# Patient Record
Sex: Female | Born: 1935 | Race: White | Hispanic: No | Smoking: Former smoker
Health system: Southern US, Community
[De-identification: ages and names within clinical notes are randomized; demographics above are authoritative.]

## PROBLEM LIST (undated history)

## (undated) DIAGNOSIS — S32509A Unspecified fracture of unspecified pubis, initial encounter for closed fracture: Secondary | ICD-10-CM

## (undated) DIAGNOSIS — R42 Dizziness and giddiness: Secondary | ICD-10-CM

## (undated) DIAGNOSIS — R0602 Shortness of breath: Secondary | ICD-10-CM

## (undated) DIAGNOSIS — R55 Syncope and collapse: Secondary | ICD-10-CM

## (undated) DIAGNOSIS — A0472 Enterocolitis due to Clostridium difficile, not specified as recurrent: Secondary | ICD-10-CM

## (undated) DIAGNOSIS — I2510 Atherosclerotic heart disease of native coronary artery without angina pectoris: Secondary | ICD-10-CM

## (undated) DIAGNOSIS — Z8669 Personal history of other diseases of the nervous system and sense organs: Secondary | ICD-10-CM

## (undated) DIAGNOSIS — I779 Disorder of arteries and arterioles, unspecified: Secondary | ICD-10-CM

## (undated) DIAGNOSIS — N200 Calculus of kidney: Secondary | ICD-10-CM

## (undated) DIAGNOSIS — I00 Rheumatic fever without heart involvement: Secondary | ICD-10-CM

## (undated) DIAGNOSIS — R001 Bradycardia, unspecified: Secondary | ICD-10-CM

## (undated) DIAGNOSIS — Z951 Presence of aortocoronary bypass graft: Secondary | ICD-10-CM

## (undated) DIAGNOSIS — I10 Essential (primary) hypertension: Secondary | ICD-10-CM

## (undated) DIAGNOSIS — I739 Peripheral vascular disease, unspecified: Secondary | ICD-10-CM

## (undated) DIAGNOSIS — K30 Functional dyspepsia: Secondary | ICD-10-CM

## (undated) DIAGNOSIS — K219 Gastro-esophageal reflux disease without esophagitis: Secondary | ICD-10-CM

## (undated) DIAGNOSIS — R269 Unspecified abnormalities of gait and mobility: Secondary | ICD-10-CM

## (undated) DIAGNOSIS — K5792 Diverticulitis of intestine, part unspecified, without perforation or abscess without bleeding: Secondary | ICD-10-CM

## (undated) DIAGNOSIS — J449 Chronic obstructive pulmonary disease, unspecified: Secondary | ICD-10-CM

## (undated) DIAGNOSIS — E785 Hyperlipidemia, unspecified: Secondary | ICD-10-CM

## (undated) DIAGNOSIS — N6019 Diffuse cystic mastopathy of unspecified breast: Secondary | ICD-10-CM

## (undated) HISTORY — DX: Disorder of arteries and arterioles, unspecified: I77.9

## (undated) HISTORY — PX: COLONOSCOPY: SHX174

## (undated) HISTORY — DX: Essential (primary) hypertension: I10

## (undated) HISTORY — DX: Atherosclerotic heart disease of native coronary artery without angina pectoris: I25.10

## (undated) HISTORY — DX: Diffuse cystic mastopathy of unspecified breast: N60.19

## (undated) HISTORY — DX: Dizziness and giddiness: R42

## (undated) HISTORY — PX: BREAST CYST ASPIRATION: SHX578

## (undated) HISTORY — DX: Calculus of kidney: N20.0

## (undated) HISTORY — DX: Bradycardia, unspecified: R00.1

## (undated) HISTORY — DX: Hyperlipidemia, unspecified: E78.5

## (undated) HISTORY — DX: Gastro-esophageal reflux disease without esophagitis: K21.9

## (undated) HISTORY — DX: Enterocolitis due to Clostridium difficile, not specified as recurrent: A04.72

## (undated) HISTORY — DX: Functional dyspepsia: K30

## (undated) HISTORY — DX: Rheumatic fever without heart involvement: I00

## (undated) HISTORY — DX: Personal history of other diseases of the nervous system and sense organs: Z86.69

## (undated) HISTORY — DX: Presence of aortocoronary bypass graft: Z95.1

## (undated) HISTORY — DX: Syncope and collapse: R55

## (undated) HISTORY — DX: Diverticulitis of intestine, part unspecified, without perforation or abscess without bleeding: K57.92

## (undated) HISTORY — DX: Unspecified fracture of unspecified pubis, initial encounter for closed fracture: S32.509A

## (undated) HISTORY — DX: Unspecified abnormalities of gait and mobility: R26.9

## (undated) HISTORY — DX: Peripheral vascular disease, unspecified: I73.9

## (undated) HISTORY — DX: Shortness of breath: R06.02

## (undated) HISTORY — PX: OTHER SURGICAL HISTORY: SHX169

---
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
## 1966-09-19 HISTORY — PX: TOTAL ABDOMINAL HYSTERECTOMY: SHX209

## 1992-09-19 HISTORY — PX: BREAST BIOPSY: SHX20

## 1997-09-19 HISTORY — PX: CORONARY ARTERY BYPASS GRAFT: SHX141

## 2005-05-30 ENCOUNTER — Ambulatory Visit: Payer: Self-pay | Admitting: Cardiology

## 2005-06-13 ENCOUNTER — Ambulatory Visit: Payer: Self-pay | Admitting: General Surgery

## 2005-07-15 ENCOUNTER — Ambulatory Visit: Payer: Self-pay | Admitting: Cardiology

## 2005-10-13 ENCOUNTER — Ambulatory Visit: Payer: Self-pay | Admitting: Cardiology

## 2006-04-12 ENCOUNTER — Ambulatory Visit: Payer: Self-pay | Admitting: Chiropractic Medicine

## 2006-07-07 ENCOUNTER — Ambulatory Visit: Payer: Self-pay | Admitting: General Surgery

## 2006-07-21 ENCOUNTER — Ambulatory Visit: Payer: Self-pay | Admitting: Cardiology

## 2006-07-26 ENCOUNTER — Ambulatory Visit: Payer: Self-pay

## 2007-05-30 ENCOUNTER — Ambulatory Visit: Payer: Self-pay | Admitting: Cardiology

## 2007-06-01 ENCOUNTER — Ambulatory Visit: Payer: Self-pay

## 2007-07-16 ENCOUNTER — Ambulatory Visit: Payer: Self-pay | Admitting: Cardiology

## 2007-08-01 ENCOUNTER — Ambulatory Visit: Payer: Self-pay | Admitting: General Surgery

## 2007-09-20 HISTORY — PX: CATARACT EXTRACTION: SUR2

## 2007-12-24 ENCOUNTER — Ambulatory Visit: Payer: Self-pay | Admitting: Cardiology

## 2008-01-08 ENCOUNTER — Ambulatory Visit: Payer: Self-pay

## 2008-06-30 ENCOUNTER — Ambulatory Visit: Payer: Self-pay | Admitting: Cardiology

## 2008-07-23 ENCOUNTER — Ambulatory Visit: Payer: Self-pay

## 2008-08-06 ENCOUNTER — Ambulatory Visit: Payer: Self-pay | Admitting: Ophthalmology

## 2008-08-19 ENCOUNTER — Ambulatory Visit: Payer: Self-pay | Admitting: Ophthalmology

## 2008-09-04 ENCOUNTER — Ambulatory Visit: Payer: Self-pay | Admitting: General Surgery

## 2008-10-01 ENCOUNTER — Ambulatory Visit: Payer: PRIVATE HEALTH INSURANCE | Admitting: Ophthalmology

## 2008-10-14 ENCOUNTER — Ambulatory Visit: Payer: PRIVATE HEALTH INSURANCE | Admitting: Ophthalmology

## 2008-12-11 ENCOUNTER — Encounter: Payer: Self-pay | Admitting: Cardiology

## 2008-12-11 ENCOUNTER — Telehealth (INDEPENDENT_AMBULATORY_CARE_PROVIDER_SITE_OTHER): Payer: Self-pay | Admitting: *Deleted

## 2008-12-11 ENCOUNTER — Ambulatory Visit: Payer: Self-pay | Admitting: Cardiology

## 2008-12-15 ENCOUNTER — Ambulatory Visit: Payer: Self-pay

## 2008-12-15 ENCOUNTER — Encounter: Payer: Self-pay | Admitting: Internal Medicine

## 2009-01-12 ENCOUNTER — Ambulatory Visit: Payer: Self-pay | Admitting: Cardiology

## 2009-07-27 ENCOUNTER — Encounter: Payer: Self-pay | Admitting: Cardiology

## 2009-07-28 ENCOUNTER — Ambulatory Visit: Payer: Self-pay

## 2009-08-03 ENCOUNTER — Encounter: Payer: Self-pay | Admitting: Cardiology

## 2009-09-14 ENCOUNTER — Ambulatory Visit: Payer: PRIVATE HEALTH INSURANCE | Admitting: General Surgery

## 2009-09-19 HISTORY — PX: CATARACT EXTRACTION: SUR2

## 2009-10-21 LAB — HM COLONOSCOPY

## 2009-11-11 ENCOUNTER — Encounter (INDEPENDENT_AMBULATORY_CARE_PROVIDER_SITE_OTHER): Payer: Self-pay | Admitting: *Deleted

## 2009-12-24 ENCOUNTER — Encounter: Payer: Self-pay | Admitting: Cardiology

## 2009-12-25 ENCOUNTER — Ambulatory Visit: Payer: Self-pay | Admitting: Cardiology

## 2010-01-15 ENCOUNTER — Ambulatory Visit: Payer: PRIVATE HEALTH INSURANCE | Admitting: General Surgery

## 2010-01-20 ENCOUNTER — Ambulatory Visit: Payer: PRIVATE HEALTH INSURANCE | Admitting: General Surgery

## 2010-01-27 ENCOUNTER — Encounter: Payer: Self-pay | Admitting: Cardiology

## 2010-01-28 ENCOUNTER — Ambulatory Visit: Payer: Self-pay

## 2010-01-28 ENCOUNTER — Encounter: Payer: Self-pay | Admitting: Cardiology

## 2010-03-18 ENCOUNTER — Ambulatory Visit: Payer: PRIVATE HEALTH INSURANCE | Admitting: Otolaryngology

## 2010-06-29 ENCOUNTER — Encounter: Payer: Self-pay | Admitting: Cardiology

## 2010-07-01 ENCOUNTER — Ambulatory Visit: Payer: Self-pay | Admitting: Cardiology

## 2010-07-01 ENCOUNTER — Telehealth (INDEPENDENT_AMBULATORY_CARE_PROVIDER_SITE_OTHER): Payer: Self-pay | Admitting: Radiology

## 2010-07-05 ENCOUNTER — Ambulatory Visit: Payer: Self-pay | Admitting: Cardiology

## 2010-07-05 ENCOUNTER — Encounter: Payer: Self-pay | Admitting: Cardiovascular Disease

## 2010-07-05 ENCOUNTER — Ambulatory Visit: Payer: Self-pay

## 2010-07-05 ENCOUNTER — Encounter (HOSPITAL_COMMUNITY)
Admission: RE | Admit: 2010-07-05 | Discharge: 2010-07-20 | Payer: Self-pay | Source: Home / Self Care | Attending: Cardiology | Admitting: Cardiology

## 2010-07-05 ENCOUNTER — Encounter: Payer: Self-pay | Admitting: *Deleted

## 2010-07-06 ENCOUNTER — Ambulatory Visit: Payer: Self-pay | Admitting: Cardiology

## 2010-07-13 ENCOUNTER — Telehealth: Payer: Self-pay | Admitting: Cardiology

## 2010-09-22 ENCOUNTER — Ambulatory Visit: Payer: PRIVATE HEALTH INSURANCE | Admitting: General Surgery

## 2010-10-06 ENCOUNTER — Ambulatory Visit: Admission: RE | Admit: 2010-10-06 | Discharge: 2010-10-06 | Payer: Self-pay | Source: Home / Self Care

## 2010-10-06 ENCOUNTER — Ambulatory Visit
Admission: RE | Admit: 2010-10-06 | Discharge: 2010-10-06 | Payer: Self-pay | Source: Home / Self Care | Attending: Cardiology | Admitting: Cardiology

## 2010-10-06 ENCOUNTER — Encounter: Payer: Self-pay | Admitting: Cardiology

## 2010-10-19 NOTE — Assessment & Plan Note (Signed)
Summary: dizziness,lightheaded,syncope/mt   Visit Type:  Follow-up Primary Glennon Kopko:  Jeffries-Lynch,cheryl  CC:   CAD.  History of Present Illness: The patient is seen for followup of coronary artery disease.  She has had some mild problems.  There is question of a TIA.  She has seen Dr.Love.  We will be obtaining his consult note today.  He did add Plavix to her aspirin.  She's had some indigestion symptoms.  Etiology is not yet clear.  She is on protonic.  She did not have chest pain with her coronary syndrome in the past.  Her husband died of coronary disease in his symptom was nausea and indigestion.  She is therefore concerned.  She also had some dizziness.  It is possible that this was part of the TIA symptoms that she had.  She also had an episode after playing 18 holes of golf of presyncope.  She also had an increase in her hydrochlorothiazide at that time.  Medicine was decreased and she has been stable.  Current Medications (verified): 1)  Diovan 320 Mg Tabs (Valsartan) .... Take One Tablet By Mouth Daily 2)  Aspirin Ec 325 Mg Tbec (Aspirin) .... Take One Tablet By Mouth Daily 3)  Simvastatin 40 Mg Tabs (Simvastatin) .... Take One Tablet By Mouth Daily At Bedtime 4)  Calcium 500+d 500-400 Mg-Unit Tabs (Calcium Carbonate-Vitamin D) .Marland Kitchen.. 1 Once Daily 5)  Protonix 40 Mg Tbec (Pantoprazole Sodium) .... Once Daily 6)  Plavix 75 Mg Tabs (Clopidogrel Bisulfate) .... Take One Tablet By Mouth Daily 7)  Hydrochlorothiazide 12.5 Mg Tabs (Hydrochlorothiazide) .... Take One Tablet By Mouth Daily. 8)  Multivitamins   Tabs (Multiple Vitamin) .... Once Daily 9)  Metamucil Plus Calcium  Caps (Psyllium-Calcium) .... 2 Once Daily  Allergies (verified): 1)  ! Codeine  Past History:  Past Medical History: Carotid artery disease   doppler..07/28/2009...[id number] LICA (slight progression) /   Jodelle Gross 01/28/2010..progression RICA [id number], LICA. no change...60-79%.. next 6  months HYPERLIPIDEMIA (ICD-272.4) HYPERTENSION (ICD-401.9) SHORTNESS OF BREATH (ICD-786.05) CAD....nuclear 05/2007...no scar or ischemia LV . normal by nuclear  2008  (no echo data as of 12/24/2009) Dizziness    stable now( October, 2011) patient tells me question of TIA... we will obtain records Syncopal episode     after 18 holes of golf and increase hydrochlorothiazide... resolved  Indigestion     3 weeks... October, 2011    Review of Systems       Patient denies fever, chills, headache, sweats, rash, change in vision, change in hearing, chest pain, cough, nausea vomiting, urinary symptoms.  All other systems are reviewed and are negative.  Vital Signs:  Patient profile:   77 year old female Height:      63 inches Weight:      128 pounds BMI:     22.76 Pulse rate:   70 / minute BP sitting:   108 / 60  (left arm) Cuff size:   regular  Vitals Entered By: Mignon Pine, RMA (July 01, 2010 8:15 AM)  Physical Exam  General:  patient is stable today. Head:  head is atraumatic. Eyes:  no xanthelasma. Neck:  no jugular venous distention. Chest Wall:  no chest wall tenderness. Lungs:  lungs are clear.  Respiratory effort is nonlabored. Heart:  cardiac exam reveals S1-S2.  No clicks or significant murmurs. Abdomen:  abdomen is soft. Msk:  no musculoskeletal deformities. Extremities:  no peripheral edema. Skin:  no skin rashes. Psych:  patient is oriented to person time and  place.  Affect is normal.   Impression & Recommendations:  Problem # 1:  * INDIGESTION The patient has had some indigestion in the past.  She is on Protonix.  It is possible that the addition of Plavix as cause increased symptoms.  I will put her Plavix on hold for a period of time and watch for symptoms.  We will also rule out cardiac ischemia.  Also we'll increase her PPI to b.i.d. for one week.  Problem # 2:  SYNCOPE (ICD-780.2)  Her updated medication list for this problem includes:    Aspirin Ec  325 Mg Tbec (Aspirin) .Marland Kitchen... Take one tablet by mouth daily    Plavix 75 Mg Tabs (Clopidogrel bisulfate) .Marland Kitchen... Take one tablet by mouth daily (hold) The patient had one isolated syncopal episode.  It sounds like dehydration having played 18 holes of golf and had increased hydrochlorothiazide.  No further workup.  Problem # 3:  * PLAVIX THERAPY For now we will put her Plavix on hold for one week.  Problem # 4:  * QUESTION TIA I will obtain the information from Dr. Erling Cruz about a possible TIA.  Problem # 5:  * DIZZINESS She is not having dizziness now.  It is my understanding this may be one of the symptoms related to possible TIA.  Problem # 6:  CAROTID ARTERY DISEASE (ICD-433.10)  Her updated medication list for this problem includes:    Aspirin Ec 325 Mg Tbec (Aspirin) .Marland Kitchen... Take one tablet by mouth daily    Plavix 75 Mg Tabs (Clopidogrel bisulfate) .Marland Kitchen... Take one tablet by mouth daily (hold) The patient had followup Doppler in May, 2011.  There was some progression.  She is to have a follow up in 6 months.  Problem # 7:  CAD (ICD-414.00)  Her updated medication list for this problem includes:    Aspirin Ec 325 Mg Tbec (Aspirin) .Marland Kitchen... Take one tablet by mouth daily    Plavix 75 Mg Tabs (Clopidogrel bisulfate) .Marland Kitchen... Take one tablet by mouth daily (hold) We need to be sure that the patient's indigestion is not ischemia.  She has coronary disease.  Her last nuclear scan was 2008.  I do not have an echo done recently.  We will proceed with a [place] scan Myoview.  This will be done very soon so that I can see her back next week.  She hopes to be able to travel out of town week from today.  Orders: Nuclear Stress Test (Nuc Stress Test)  Patient Instructions: 1)  Hold Plavix 2)  Increase Protonix to two times a day  3)  Your physician has requested that you have a Lexiscan myoview.  For further information please visit [email or web address].  Please follow instruction sheet, as given. 4)   Follow up in 1 week.

## 2010-10-19 NOTE — Progress Notes (Signed)
Summary: pharmacy calling/ rx refill  Phone Note Refill Request Call back at [phone]   Refills Requested: Medication #1:  PROTONIX 40 MG TBEC Take 1 tablet by mouth two times a day  Medication #2:  PLAVIX 75 MG TABS Take one tablet by mouth daily Caller: [place]* Summary of Call: calling regarding medication Ref# [id number] Initial call taken by: Delsa Sale,  July 13, 2010 2:10 PM  Follow-up for Phone Call        per pt calling back in regards to medication plavix.   per pt calling back regarding her rx. ref # [id number]. [phone] .ask for natasha.  phone  # [phone] Neil Crouch  July 13, 2010 2:59 PM  Spoke with [facility] they were questioning if Pt was to be on both meds because of an possible interaction with the two Follow-up by: Mignon Pine, RMA,  July 14, 2010 4:29 PM

## 2010-10-19 NOTE — Assessment & Plan Note (Signed)
Summary: Cardiology Nuclear Testing  Nuclear Med Background Indications for Stress Test: Evaluation for Ischemia   History: CABG, COPD, Myocardial Perfusion Study  History Comments: '99 CABG x 3 3/10- Nl. MPS. EF= 68%  Symptoms: Dizziness, Palpitations  Symptoms Comments: CP>back   Nuclear Pre-Procedure Cardiac Risk Factors: Carotid Disease, Family History - CAD, History of Smoking, Hypertension, Lipids Caffeine/Decaff Intake: none NPO After: 7:30 AM Lungs: clea IV 0.9% NS with Angio Cath: 22g     IV Site: R Hand IV Started by: Matilde Haymaker, RN Chest Size (in) 34     Cup Size B     Height (in): 63 Weight (lb): 129 BMI: 22.93  Nuclear Med Study 1 or 2 day study:  1 day     Stress Test Type:  Carlton Adam Reading MD:  Darlin Coco, MD     Referring MD:  Dola Argyle, MD Resting Radionuclide:  Technetium 50m Tetrofosmin     Resting Radionuclide Dose:  11.0 mCi  Stress Radionuclide:  Technetium 63m Tetrofosmin     Stress Radionuclide Dose:  33.0 mCi   Stress Protocol   Lexiscan: 0.4 mg   Stress Test Technologist:  Perrin Maltese, EMT-P     Nuclear Technologist:  Annye Rusk, CNMT  Rest Procedure  Myocardial perfusion imaging was performed at rest 45 minutes following the intravenous administration of Technetium 16m Tetrofosmin.  Stress Procedure  The patient received IV Lexiscan 0.4 mg over 15-seconds.  Technetium 90m Tetrofosmin injected at 30-seconds.  There were no significant changes and occ pvcs/pacs with infusion.  Quantitative spect images were obtained after a 45 minute delay.  QPS Raw Data Images:  Acquisition technically good; normal left ventricular size. Stress Images:  Normal homogeneous uptake in all areas of the myocardium. Rest Images:  Normal homogeneous uptake in all areas of the myocardium. Subtraction (SDS):  Normal Transient Ischemic Dilatation:  0.97  (Normal <1.22)  Lung/Heart Ratio:  0.30  (Normal <0.45)  Quantitative Gated Spect  Images QGS cine images:  Not gated  Findings Low risk nuclear study      Overall Impression  Exercise Capacity: Lexiscan with no exercise. BP Response: Normal blood pressure response. Clinical Symptoms: No chest pain ECG Impression: There are scattered PVCs. Overall Impression: Low risk stress nuclear study. Overall Impression Comments: No evidence of ischemia by perfusion.  No EKG changes of ischemia.  Occasional PVCs. Study was not gated.  Appended Document: Cardiology Nuclear Testing pt aware at appt 10/18

## 2010-10-19 NOTE — Assessment & Plan Note (Signed)
Summary: f1y   Visit Type:  Follow-up Primary Provider:  Jeffries-Lynch,cheryl  CC:  CAD.  History of Present Illness: The patient is seen for follow up of coronary disease.  Historically her symptom of ischemia was significant shortness of breath.  She is not having this.  She does not have chest pain.  She has some mild shortness of breath with excessive exertion.  Patient does have carotid disease that is being followed carefully.  Current Medications (verified): 1)  Diovan 160 Mg Tabs (Valsartan) .... Take One Tablet By Mouth Daily 2)  Aspirin Ec 325 Mg Tbec (Aspirin) .... Take One Tablet By Mouth Daily 3)  Crestor 40 Mg Tabs (Rosuvastatin Calcium) .... Take One Tablet By Mouth Daily. 4)  Calcium 500+d 500-400 Mg-Unit Tabs (Calcium Carbonate-Vitamin D) .Marland Kitchen.. 1 Once Daily 5)  Protonix 40 Mg Tbec (Pantoprazole Sodium) .... Once Daily 6)  Triamterene-Hctz 37.5-25 Mg Tabs (Triamterene-Hctz) .... 1/2 Daily 7)  Ciprofloxacin Hcl 500 Mg Tabs (Ciprofloxacin Hcl) .... Two Times A Day 8)  Metronidazole 500 Mg Tabs (Metronidazole) .... Two Times A Day  Allergies (verified): 1)  ! Codeine  Past History:  Past Medical History: Last updated: 12/24/2009 Carotid artery disease   doppler..07/28/2009...[id number] LICA (slight progression) f/u 6 mo. [place] ([place].4) HYPERTENSION (ICD-401.9) SHORTNESS OF BREATH (ICD-786.05) CAD....nuclear 05/2007...no scar or ischemia LV . normal by nuclear  2008  (no echo data as of 12/24/2009)    Review of Systems       Patient denies fever, chills, headache, sweats, rash, change in vision, change in hearing, chest pain, cough, nausea vomiting, urinary symptoms.  All other systems are reviewed and are negative.  Vital Signs:  Patient profile:   77 year old female Height:      63 inches Weight:      128 pounds BMI:     22.76 Pulse rate:   63 / minute BP sitting:   106 / 60  (left arm) Cuff size:   regular  Vitals Entered By:  Mignon Pine, RMA (December 25, 2009 2:44 PM)  Physical Exam  General:   patient is quite stable. Eyes:    No xanthelasma. Neck:  no jugular venous distention. Lungs:  lungs are clear respiratory effort is nonlabored. Heart:  cardiac exam reveals S1 and S2.  No clicks or significant murmurs. Abdomen:  abdomen is soft. Extremities:  no peripheral edema. Psych:  patient is oriented to person time and place affect is normal.   Impression & Recommendations:  Problem # 1:  CAROTID ARTERY DISEASE (ICD-433.10)  Her updated medication list for this problem includes:    Aspirin Ec 325 Mg Tbec (Aspirin) .Marland Kitchen... Take one tablet by mouth daily The patient's carotids are being followed carefully.  Problem # 2:  HYPERTENSION (ICD-401.9)  Her updated medication list for this problem includes:    Diovan 160 Mg Tabs (Valsartan) .Marland Kitchen... Take one tablet by mouth daily    Aspirin Ec 325 Mg Tbec (Aspirin) .Marland Kitchen... Take one tablet by mouth daily    Triamterene-hctz 37.5-25 Mg Tabs (Triamterene-hctz) .Marland Kitchen... 1/2 daily blood pressure is well controlled.  Problem # 3:  SHORTNESS OF BREATH (ICD-786.05)  Her updated medication list for this problem includes:    Diovan 160 Mg Tabs (Valsartan) .Marland Kitchen... Take one tablet by mouth daily    Aspirin Ec 325 Mg Tbec (Aspirin) .Marland Kitchen... Take one tablet by mouth daily    Triamterene-hctz 37.5-25 Mg Tabs (Triamterene-hctz) .Marland Kitchen... 1/2 daily Patient has some mild shortness of breath  but I'm not convinced that represents ischemia.  She will be in touch with me if he changes.  Problem # 4:  CAD (ICD-414.00)  Her updated medication list for this problem includes:    Aspirin Ec 325 Mg Tbec (Aspirin) .Marland Kitchen... Take one tablet by mouth daily  Orders: EKG w/ Interpretation (93000) Coronary disease is stable.  EKG is done today and reviewed by me.  There is sinus rhythm.  There are nonspecific ST-T wave changes.  Patient is stable.  No change in medication.  Followup one year.  Patient  Instructions: 1)  Your physician recommends that you schedule a follow-up appointment in: 1 year

## 2010-10-19 NOTE — Miscellaneous (Signed)
  Clinical Lists Changes  Problems: Removed problem of CAROTID BRUIT (ICD-785.9) Added new problem of CAROTID ARTERY DISEASE (ICD-433.10) Observations: Added new observation of PAST MED HX: Carotid artery disease   doppler..07/28/2009...[id number] LICA (slight progression) f/u 6 mo. [place] ([place].4) HYPERTENSION (ICD-401.9) SHORTNESS OF BREATH (ICD-786.05) CAD....nuclear 05/2007...no scar or ischemia LV . normal by nuclear  2008  (no echo data as of 12/24/2009)   (12/24/2009 8:15) Added new observation of PRIMARY MD: Jeffries-Lynch,cheryl (12/24/2009 8:15)       Past History:  Past Medical History: Carotid artery disease   doppler..07/28/2009...[id number] LICA (slight progression) f/u 6 mo. [place] ([place].4) HYPERTENSION (ICD-401.9) SHORTNESS OF BREATH (ICD-786.05) CAD....nuclear 05/2007...no scar or ischemia LV . normal by nuclear  2008  (no echo data as of 12/24/2009)

## 2010-10-19 NOTE — Progress Notes (Signed)
Summary: Nuc Pre-Procedure  Phone Note Outgoing Call Call back at [facility] Phone [phone]   Call placed by: Evalina Field, RT-N,  July 01, 2010 3:27 PM Call placed to: Patient Reason for Call: Confirm/change Appt Summary of Call: Left message with information on Myoview Information Sheet (see scanned document for details).      Nuclear Med Background Indications for Stress Test: Evaluation for Ischemia   History: CABG, COPD, Myocardial Perfusion Study  History Comments: '99 CABG x 3 3/10- Nl. MPS. EF= 68%  Symptoms: Dizziness  Symptoms Comments: CP>back   Nuclear Pre-Procedure Cardiac Risk Factors: Carotid Disease, Family History - CAD, History of Smoking, Hypertension, Lipids Height (in): 97  Nuclear Med Study Referring MD:  Dola Argyle, MD

## 2010-10-19 NOTE — Assessment & Plan Note (Signed)
Summary: f/u myoview 07-05-10/sl   Visit Type:  Follow-up Primary Provider:  Jeffries-Lynch,cheryl  CC:  CAD .  History of Present Illness: The patient is seen for follow up of coronary disease.  I saw her last on July 01, 2010.  She has known coronary disease.  She had never actually had significant symptoms.  Recently she had indigestion.  Her husband is symptom of coronary disease was indigestion and she was very worried about.  She was getting ready to travel out of town.  Also she had been on Plavix recently that was added for a TIA.  I decided to increase her PPI and to hold her Plavix for a brief period of time.  We also decided to proceed with a stress Myoview scan.  The scan was done and there is no sign of ischemia.  Ejection fraction is not available as the study could not be gated.  There was no definite ischemia.  She returns today feeling relatively well.  She is some mild intermittent indigestion.  It is definitely not exertional  Current Medications (verified): 1)  Diovan 320 Mg Tabs (Valsartan) .... Take One Tablet By Mouth Daily 2)  Aspirin Ec 325 Mg Tbec (Aspirin) .... Take One Tablet By Mouth Daily 3)  Simvastatin 40 Mg Tabs (Simvastatin) .... Take One Tablet By Mouth Daily At Bedtime 4)  Calcium 500+d 500-400 Mg-Unit Tabs (Calcium Carbonate-Vitamin D) .Marland Kitchen.. 1 Once Daily 5)  Protonix 40 Mg Tbec (Pantoprazole Sodium) .... Take 1 Tablet By Mouth Two Times A Day 6)  Plavix 75 Mg Tabs (Clopidogrel Bisulfate) .... Take One Tablet By Mouth Daily (Hold) 7)  Hydrochlorothiazide 12.5 Mg Tabs (Hydrochlorothiazide) .... Take One Tablet By Mouth Daily. 8)  Multivitamins   Tabs (Multiple Vitamin) .... Once Daily 9)  Metamucil Plus Calcium  Caps (Psyllium-Calcium) .... 2 Once Daily  Allergies (verified): 1)  ! Codeine  Past History:  Past Medical History: Carotid artery disease   doppler..07/28/2009...[id number] LICA (slight progression) /   Jodelle Gross 01/28/2010..progression  RICA 123456, LICA. no change...60-79%.. next 6 months HYPERLIPIDEMIA (ICD-272.4) HYPERTENSION (ICD-401.9) SHORTNESS OF BREATH (ICD-786.05) CAD....nuclear 05/2007...no scar or ischemia  /  nuclear.... July 05, 2010.... not gated.... no scar or ischemia LV . normal by nuclear  2008  (no echo data as of 12/24/2009) Dizziness    stable now( October, 2011) patient tells me question of TIA... we will obtain records Syncopal episode     after 18 holes of golf and increase hydrochlorothiazide... resolved  Indigestion     3 weeks... October, 2011    Review of Systems       The patient denies fever, chills, headache, sweats, rash, change in vision, change in hearing, chest pain, cough, nausea vomiting, urinary symptoms.  All other systems are reviewed and are negative.  Vital Signs:  Patient profile:   77 year old female Height:      63 inches Weight:      130 pounds BMI:     23.11 Pulse rate:   70 / minute BP sitting:   102 / 54  (left arm) Cuff size:   regular  Vitals Entered By: Mignon Pine, RMA (July 06, 2010 3:02 PM)  Physical Exam  General:  patient is stable. Eyes:  no xanthelasma. Neck:  no jugular venous attention. Lungs:  lungs are clear.  Respiratory effort is nonlabored. Heart:  cardiac exam reveals S1-S2.  No clicks or significant murmurs. Abdomen:  abdomen is soft. Extremities:  no peripheral edema. Psych:  patient is oriented to person time and place.  Affect is normal.   Impression & Recommendations:  Problem # 1:  * INDIGESTION Indigestion is mild.  She has been off Plavix for 5 days with no significant change.  I feel that the Plavix is not playing a role.  She will remain on her PPI.  Problem # 2:  * PLAVIX THERAPY I decided to restart her Plavix in his 81 mg of aspirin.   Problem # 3:  * QUESTION TIA Is because of this issue and I put her back on the Plavix with her aspirin.  Problem # 4:  CAROTID ARTERY DISEASE (ICD-433.10)  Her updated medication  list for this problem includes:    Aspirin 81 Mg Tbec (Aspirin) .Marland Kitchen... Take one tablet by mouth daily    Plavix 75 Mg Tabs (Clopidogrel bisulfate) .Marland Kitchen... Take one tablet by mouth daily We're following her carotids carefully.  Problem # 5:  CAD (ICD-414.00)  Her updated medication list for this problem includes:    Aspirin 81 Mg Tbec (Aspirin) .Marland Kitchen... Take one tablet by mouth daily    Plavix 75 Mg Tabs (Clopidogrel bisulfate) .Marland Kitchen... Take one tablet by mouth daily Coronary disease is stable.  Her nuclear scan reveals no scar or ischemia.  No further workup at this time.  Patient Instructions: 1)  Restart Plavix 75mg  daily 2)  Decrease ASA to 81mg  daily 3)  Follow up in 3 months Prescriptions: PLAVIX 75 MG TABS (CLOPIDOGREL BISULFATE) Take one tablet by mouth daily  #90 x 3   Entered by:   Kevan Rosebush, RN   Authorized by:   Lorenza Evangelist, MD, [facility]   Signed by:   Kevan Rosebush, RN on 07/06/2010   Method used:   Electronically to        [facility]* ([place]  [address]       Ph: [phone]       Fax: [phone]   RxID:   [id number] PLAVIX 75 MG TABS (CLOPIDOGREL BISULFATE) Take one tablet by mouth daily  #90 x 3   Entered by:   Kevan Rosebush, RN   Authorized by:   Lorenza Evangelist, MD, [facility]   Signed by:   Kevan Rosebush, RN on 07/06/2010   Method used:   Print then Give to Patient   RxID:   [id number]

## 2010-10-19 NOTE — Letter (Signed)
Summary: Appointment - [facility] [place], [place]  [address] [place]   [place], [place] [address]   Phone: [phone]  Fax: [phone]     November 11, 2009 MRN: [id number]   [facility] [address] [place], [place]  [address]   Dear Ms. Fitchett,  Our records indicate that it is time to schedule a follow-up appointment with Dr. Ron Parker. It is very important that we reach you to schedule this appointment. We look forward to participating in your health care needs. Please contact us at the number listed above at your earliest convenience to schedule your appointment.  If you are unable to make an appointment at this time, give [place] a call so we can update our records.     Sincerely,   Darnell Level [facility] Scheduling Team

## 2010-10-19 NOTE — Miscellaneous (Signed)
  Clinical Lists Changes  Observations: Added new observation of PAST MED HX: Carotid artery disease   doppler..07/28/2009...[id number] LICA (slight progression) /   Jodelle Gross 01/28/2010..progression RICA 123456, LICA. no change...60-79%.. next 6 months HYPERLIPIDEMIA (ICD-272.4) HYPERTENSION (ICD-401.9) SHORTNESS OF BREATH (ICD-786.05) CAD....nuclear 05/2007...no scar or ischemia LV . normal by nuclear  2008  (no echo data as of 12/24/2009)   (06/29/2010 17:49) Added new observation of PRIMARY MD: Jeffries-Lynch,cheryl (06/29/2010 17:49)       Past History:  Past Medical History: Carotid artery disease   doppler..07/28/2009...[id number] LICA (slight progression) /   Jodelle Gross 01/28/2010..progression RICA 123456, LICA. no change...60-79%.. next 6 months HYPERLIPIDEMIA (ICD-272.4) HYPERTENSION (ICD-401.9) SHORTNESS OF BREATH (ICD-786.05) CAD....nuclear 05/2007...no scar or ischemia LV . normal by nuclear  2008  (no echo data as of 12/24/2009)

## 2010-10-19 NOTE — Miscellaneous (Signed)
Summary: Orders Update  Clinical Lists Changes  Orders: Added new Test order of Carotid Duplex (Carotid Duplex) - Signed 

## 2010-10-21 NOTE — Assessment & Plan Note (Signed)
Summary: 3 MONTH ROV/SL   Visit Type:  Follow-up Primary Provider:  Jeffries-Lynch,cheryl  CC:  CAD.  History of Present Illness: The patient is seen for followup of coronary artery disease.  She is doing well.  I saw her last October, 2011.  She's had some indigestion in the past.  We were concerned about this symptom and a followup stress study revealed no significant ischemia.  When I saw her last we arrange for a three-month followup.  She has been active and she is not having any significant symptoms.  Preventive Screening-Counseling & Management  Caffeine-Diet-Exercise     Does Patient Exercise: yes      Drug Use:  no.    Current Medications (verified): 1)  Diovan 320 Mg Tabs (Valsartan) .... Take One Tablet By Mouth Daily 2)  Aspirin 81 Mg Tbec (Aspirin) .... Take One Tablet By Mouth Daily 3)  Simvastatin 40 Mg Tabs (Simvastatin) .... Take One Tablet By Mouth Daily At Bedtime 4)  Calcium 500+d 500-400 Mg-Unit Tabs (Calcium Carbonate-Vitamin D) .Marland Kitchen.. 1 Once Daily 5)  Protonix 40 Mg Tbec (Pantoprazole Sodium) .... Take 1 Tablet By Mouth Two Times A Day 6)  Plavix 75 Mg Tabs (Clopidogrel Bisulfate) .... Take One Tablet By Mouth Daily 7)  Hydrochlorothiazide 12.5 Mg Tabs (Hydrochlorothiazide) .... Take One Tablet By Mouth Daily. 8)  Multivitamins   Tabs (Multiple Vitamin) .... Once Daily 9)  Metamucil Plus Calcium  Caps (Psyllium-Calcium) .... 2 Once Daily  Allergies (verified): 1)  ! Codeine  Past History:  Past Medical History: Carotid artery disease   doppler..07/28/2009...[id number] LICA (slight progression) /   Jodelle Gross 01/28/2010..progression RICA [id number], LICA. no change...60-79%.. next 6 months HYPERLIPIDEMIA (ICD-272.4) HYPERTENSION (ICD-401.9) SHORTNESS OF BREATH (ICD-786.05) CAD....nuclear 05/2007...no scar or ischemia  /  nuclear.... July 05, 2010.... not gated.... no scar or ischemia LV . normal by nuclear  2008  (no echo data as of 12/24/2009) Dizziness     stable now( October, 2011) patient tells me question of TIA... we will obtain records Syncopal episode     after 18 holes of golf and increase hydrochlorothiazide... resolved  Indigestion     3 weeks... October, 2011  .Marland Kitchen  Past Surgical History: Breast Biopsy Esophagus stretched Abdominal Hysterectomy-Total Appendectomy CABG Tonsillectomy  Family History: Family History of Cancer:  Family History of Coronary Artery Disease:  Family History of Sudden Cardiac Death:   Social History: Tobacco Use - No.  Retired  Widowed  Alcohol Use - yes -- social, wine Regular Exercise - yes Drug Use - no Does Patient Exercise:  yes Drug Use:  no  Review of Systems       Patient denies fever, chills, headache, sweats, rash,, change in vision, change in hearing, chest pain, cough, nausea vomiting, urinary symptoms.  All other systems are reviewed and are negative.  Vital Signs:  Patient profile:   77 year old female Height:      63 inches Weight:      130 pounds BMI:     23.11 Pulse rate:   70 / minute BP sitting:   136 / 62  (left arm) Cuff size:   regular  Vitals Entered By: Mignon Pine, RMA (October 06, 2010 9:57 AM)  Physical Exam  General:  patient looks good. Eyes:  no xanthelasma. Neck:  no jugular venous distention. Lungs:  lungs are clear respiratory effort is nonlabored. Heart:  cardiac exam reveals an S1-S2.  No clicks or significant murmurs. Abdomen:  abdomen is soft. Extremities:  no peripheral edema. Psych:  patient is oriented to person time and place.  Affect is normal.   Impression & Recommendations:  Problem # 1:  * INDIGESTION Patient is on Protonix and she is not having much indigestion  Problem # 2:  SYNCOPE (ICD-780.2)  Her updated medication list for this problem includes:    Aspirin 81 Mg Tbec (Aspirin) .Marland Kitchen... Take one tablet by mouth daily    Plavix 75 Mg Tabs (Clopidogrel bisulfate) .Marland Kitchen... Take one tablet by mouth daily There's been no  syncope.  Problem # 3:  CAROTID ARTERY DISEASE (ICD-433.10)  Her updated medication list for this problem includes:    Aspirin 81 Mg Tbec (Aspirin) .Marland Kitchen... Take one tablet by mouth daily    Plavix 75 Mg Tabs (Clopidogrel bisulfate) .Marland Kitchen... Take one tablet by mouth daily Her carotids are being followed.  I will check on the timing of her next Doppler.  Orders: Carotid Duplex (Carotid Duplex)  Problem # 4:  CAD (ICD-414.00)  Her updated medication list for this problem includes:    Aspirin 81 Mg Tbec (Aspirin) .Marland Kitchen... Take one tablet by mouth daily    Plavix 75 Mg Tabs (Clopidogrel bisulfate) .Marland Kitchen... Take one tablet by mouth daily Coronary disease is stable.  No further workup.  Osteophyte in 6 months.  Patient Instructions: 1)  Your physician has requested that you have a carotid duplex. This test is an ultrasound of the carotid arteries in your neck. It looks at blood flow through these arteries that supply the brain with blood. Allow one hour for this exam. There are no restrictions or special instructions. 2)  Your physician wants you to follow-up in:  6 months.  You will receive a reminder letter in the mail two months in advance. If you don't receive a letter, please call our office to schedule the follow-up appointment.

## 2011-02-01 NOTE — Assessment & Plan Note (Signed)
[place] OFFICE NOTE   Katrina, Allen                    MRN:          [id number]  DATE:12/11/2008                            DOB:          Nov 12, 1925    HISTORY OF PRESENT ILLNESS:  [name] is doing well.  She has known  coronary disease.  She is complaining today of some shortness of breath  with exertion.  She does not have PND or orthopnea.  She has some mild  indigestion at night time, but not with exercise.  Otherwise, she is  doing well.   PAST MEDICAL HISTORY:   ALLERGIES:  Swelling from [place].  She does not tolerate CODEINE.   MEDICATIONS:  See the EMR.   OTHER MEDICAL PROBLEMS:  See the list on the note of July 16, 2007.   REVIEW OF SYSTEMS:  Today she has no fevers or chills.  She has no  headaches.  She does have some mild indigestion at night.  She has no  skin rashes.  There are no GU problems.  Review of systems otherwise is  negative.   PHYSICAL EXAMINATION:  VITAL SIGNS:  Blood pressure is 140/68 with a  pulse of 60.  GENERAL:  The patient is oriented to person, time and place.  Affect is  normal.  HEENT:  Reveals no xanthelasma.  She has normal extraocular motion.  NECK:  There is a soft carotid bruit.  There is no jugular venous  distention.  LUNGS:  Clear.  Respiratory effort is not labored.  CARDIAC:  Reveals a S1 with an S2.  There are no clicks or significant  murmurs.   EKG shows no significant change.  Carotid Dopplers done on July 23, 2008 revealed, that her disease was stable, 0 to 39% right internal  carotid artery stenosis and 40-59% left internal carotid artery  stenosis.   PROBLEMS:  Include  1. Coronary artery disease.  2. Carotid disease, stable.  3. Shortness of breath.  This is with exertion.  It may be an anginal      equivalent.  We will proceed with an adenosine Myoview scan and      then I will see her back for followup.     Carlena Bjornstad, MD, [facility]  Electronically Signed    JDK/MedQ  DD: 12/11/2008  DT: 12/12/2008  Job #: [id number]

## 2011-02-01 NOTE — Assessment & Plan Note (Signed)
[place] OFFICE NOTE   VEEHA, BACKES                    MRN:          [id number]  DATE:12/24/2007                            DOB:          November 03, 1925    [name] is actually doing well.  She has rare indigestion.  It is  at night and I do not believe that it is ischemic.  She is stable.  She  is going about full activities.   ALLERGIES:  Swelling from [place].  She does not tolerate CODEINE.   MEDICATIONS:  Protonix, hydrochlorothiazide, Diovan, aspirin, calcium  and Crestor.   PAST MEDICAL HISTORY:  See the list in the note of July 16, 2007.   REVIEW OF SYSTEMS:  She is feeling well and not having any significant  problems, other than the HPI.  Review of systems otherwise is negative.   PHYSICAL EXAMINATION:  VITAL SIGNS:  Blood pressure is 137/60 with a  pulse of 60.  GENERAL:  The patient is oriented to person, time and place.  Affect is  normal.  HEENT:  No xanthelasma.  She has normal extraocular motion.  NECK:  The patient has a right carotid bruit.  LUNGS:  Clear.  Respiratory effort is not labored.  ABDOMEN:  Soft.  She has no peripheral edema.   IMPRESSION:  [name] is stable.  Her last Doppler was 2 years ago.  Her bruit in her right neck is prominent and she needs a followup  carotid Doppler.   Problems are listed on the note of July 16, 2007.   #1.  Coronary disease, stable.  #6.  Carotid bruits.  Repeat Doppler to be done.   PLAN:  I will see her back in 6 months.     Carlena Bjornstad, MD, [facility]  Electronically Signed    JDK/MedQ  DD: 12/24/2007  DT: 12/24/2007  Job #: [id number]   cc:   Sherald Barge, M.D.

## 2011-02-01 NOTE — Assessment & Plan Note (Signed)
[place] OFFICE NOTE   ANERES, MUHL                    MRN:          [id number]  DATE:07/16/2007                            DOB:          [birth year]    [name] is here for cardiology followup. I had seen her on  May 30, 2007. We decided to proceed with chest x-ray, which showed  some COPD and a Myoview scan which fortunately showed good ejection  fraction and no sign of scar or ischemia. After that time, she traveled  with her husband to [facility] and unfortunately he became ill. I do not  have all of the specifics, but I believe he had a large myocardial  infarction and possibly had CABG, but never regained normal mental  function and he expired. She, of course, is upset, but dealing with it.  She did not have significant chest pain or cardiac problems with these  events.   PAST MEDICAL HISTORY:   ALLERGIES:  SHE GETS SWELLING FROM NORVASC. SHE ALSO DOES NOT TOLERATE  CODEINE.   MEDICATIONS:  1. Nexium.  2. Diovan.  3. Aspirin.  4. Calcium.  5. Advair.  6. Spiriva.  7. Triamterene/hydrochlorothiazide.  8. Zocor.  9. Fosamax.  10.Toprol 12.5 mg daily.  11.Protonix 40.   OTHER MEDICAL PROBLEMS:  See the list below.   REVIEW OF SYSTEMS:  Other than the situational depression with her  husband's death, she is doing well. Review of systems otherwise is  negative.   PHYSICAL EXAMINATION:  Weight is 135 pounds. Blood pressure 137/88 with  a pulse of 62. The patient is oriented to person, time and place. Affect  is normal.  HEENT: Reveals no xanthelasma. She has a soft carotid bruit. This is  known from the past and her Dopplers have shown only minimal disease. We  will do a followup Doppler at the recommended time, but not sooner.  There is no jugular venous distention.  LUNGS:  Reveal decreased breath sounds. There is no respiratory  distress.  CARDIAC: Reveals an S1, with  an S2. There are no clicks or significant  murmurs.  ABDOMEN: Soft. There are no masses or bruits.  She has no significant peripheral edema.   EKG: Reveals low-normal heart rate at this time with no acute ST  changes.   PROBLEM LIST:  1. Coronary disease with coronary artery bypass graft in 1999. She has      had some exertional symptoms. Her Myoview was normal and she is      stable.  2. Mild history of chronic shortness of breath and I suspect this is      related to underlying lung disease. Her x-ray shows COPD.  3. Hypertension, treated.  4. Hyperlipidemia, treated.  5. History of a cough related to ACE inhibitor.  6. Carotid bruits. As outlined above, her study in 2007 showed only      very minimal disease.  7. Mild bradycardia. With her mild bradycardia, I have encouraged her      to stop her very small dose beta-blocker.   She is  stable. No other changes. I will see her back in six months.     Carlena Bjornstad, MD, [facility]  Electronically Signed    JDK/MedQ  DD: 07/16/2007  DT: 07/16/2007  Job #: [id number]

## 2011-02-01 NOTE — Assessment & Plan Note (Signed)
[facility]                            CARDIOLOGY OFFICE NOTE   Katrina, Allen                    MRN:          [id number]  DATE:06/30/2008                            DOB:          August 04, 1926    [name] is stable.  She has not had any marked chest pain.  She  has slight shortness of breath but no major changes.  She has slight  dizziness at times.   She has known coronary disease.  Her last Myoview was done in September  of 2008 and this showed no marked abnormalities and she does not need  any further testing at this time.   PAST MEDICAL HISTORY:  Allergies - swelling from [facility] and she does  not tolerate CODEINE.   MEDICATIONS:  1. Protonix.  2. Diovan 160.  3. Aspirin.  4. Crestor.  5. Alendronate.  6. Triamterene/hydrochlorothiazide.   OTHER MEDICAL PROBLEMS:  See the list on my note of July 16, 2007.   REVIEW OF SYSTEMS:  She is not having any GI or GU problems.  She has no  fever or chills or skin rash.  Otherwise, her review of systems is  negative.   PHYSICAL EXAMINATION:  VITAL SIGNS:  Blood pressure is 160/80 today with  a pulse of 53.  GENERAL:  The patient is oriented to person, time and place.  Affect is  normal.  HEENT:  Reveals no xanthelasma.  She has normal extraocular motion.  NECK:  There is no jugular venous distention.  The patient has soft  carotid bruits.  LUNGS:  Lungs are clear.  Respiratory effort is not labored.  CARDIAC:  Exam reveals an S1 with an S2.  There are no clicks or  significant murmurs.  ABDOMEN:  Abdomen is soft.  She has no peripheral edema.   Her EKG today reveals sinus bradycardia with nonspecific T-wave changes  that she has had in the past.   PROBLEMS INCLUDE:  1. Coronary disease, stable.  2. Carotid bruits.  It has been 2 years since her last Doppler.  We      need a followup Doppler and I will be in touch with her with the      results.  3. Mild bradycardia.  She  has had this problem before.  We had stopped      her beta-blocker.  I      do not think she is having symptoms from bradycardia.  She is      stable.  I will see her back in 6 months.     Carlena Bjornstad, MD, [facility]  Electronically Signed    JDK/MedQ  DD: 06/30/2008  DT: 06/30/2008  Job #: [id number]   cc:   Dr Sherald Barge

## 2011-02-01 NOTE — Assessment & Plan Note (Signed)
[facility]                            CARDIOLOGY OFFICE NOTE   RYLA, STOVALL                    MRN:          [id number]  DATE:05/30/2007                            DOB:          01/23/1926    Ms. Gussler has known coronary disease.  She underwent CABG in 1999.  She has been stable over the years.  Recently, she has noted some  exertional shortness of breath.  She has had some vague chest  discomfort.  She has not had any syncope or presyncope.  At times when  she has shortness of breath, she has a sensation of weightlessness in  her arms.  She has not had any significant edema.  Sometimes she feels a  tingling sensation throughout her body when she awakens in the middle of  the night; the exact etiology is not clear.   PAST MEDICAL HISTORY:   ALLERGIES:  The patient has had swelling in her feet from [facility] in the  past.   MEDICATIONS:  1. Nexium 40.  2. Diovan 160.  3. Aspirin 325.  4. Calcium.  5. Multivitamin.  6. Advair.  7. Spiriva.  8. Triamterene/hydrochlorothiazide.  9. Toprol-XL 25 daily.  10.Zocor.  11.Fosamax once weekly.   OTHER MEDICAL PROBLEMS:  See the list below.   REVIEW OF SYSTEMS:  Other than the HPI, she is not having any  significant complaints and her review of systems otherwise is negative.   PHYSICAL EXAMINATION:  Weight is 140 pounds, which is down 2 pounds  since last year.  Blood pressure is 136/68 with a pulse of 51.  The patient is oriented to person, time and place.  Her affect is  normal.  HEENT:  No xanthelasma.  She has normal extraocular motion.  There are no carotid bruits.  There is no jugulovenous distention.  LUNGS:  Clear.  Respiratory effort is not labored.  CARDIAC:  Exam reveals an S1 with an S2.  There are no clicks or  significant murmurs.  ABDOMEN:  Soft.  She has normal bowel sounds.  There are no masses or  bruits.  There is no significant peripheral edema.  She has good  distal pulses.   EKG:  Nonspecific ST-T wave changes that she has had in the past.  Her  heart rate is 51, slightly slower than we have seen in the past.  I do  not have any updates labs since September 2007.   PROBLEM LIST:  1. Coronary disease, post coronary artery bypass graft in 1999.  With      her exertional shortness of breath, she does need an adenosine      Myoview at this time.  2. Mild history of chronic shortness of breath that seems to be      somewhat worse.  We will obtain a chest x-ray today if possible, if      not, in the near future.  3. Hypertension, treated.  4. Hyperlipidemia.  She is on Zocor.  5. History of a cough related to ACE inhibitor.  6. Carotid bruits.  In 2005, she had a Doppler.  There was a repeat in      November 2007 showing only very minimal disease.   We will do her chest x-ray and a Myoview scan and then see her for  followup.  I have also asked her to cut out her small dose of beta  blocker.  I am not convinced that she is having an anginal equivalent.  We need to rule this out.  At the same time, her bradycardia may be  playing a role.     Carlena Bjornstad, MD, [facility]  Electronically Signed    JDK/MedQ  DD: 05/30/2007  DT: 05/31/2007  Job #: [id number]   cc:   Sherald Barge MD

## 2011-02-04 NOTE — Assessment & Plan Note (Signed)
[place] OFFICE NOTE   [name], [name]                      MRN:          [id number]  DATE:07/20/2006                            DOB:          07-03-1926    [name] is seen for cardiology followup.  She is doing well.  I saw  her last in January 2007. We had been worried about some chest pain in  September of 2006.  I saw her back over time and this resolved, and I  believe that her overall cardiac status is stable.  She is not having any  significant chest pain.  She does have some tightness in her neck.  I  believe that this is musculoskeletal or neurologic and not cardiac in  origin.   She is going about full activities.  She has had no syncope or presyncope.   PAST MEDICAL HISTORY/ALLERGIES:  Swelling of her feet from [place]vasc.   MEDICATIONS:  1. Nexium 40.  2. Diovan 160.  3. Aspirin 325.  Her aspirin dose can be decreased to 8 mg.  4. Vitamins.  5. Spiriva.  6. Crestor 10.  7. Triamterene hydrochlorothiazide.  8. Toprol XL 25.   OTHER MEDICAL PROBLEMS:  See the list below.   REVIEW OF SYSTEMS:  As mentioned in the HPI.  She has some tightness in her  neck, otherwise the review of systems is negative.   PHYSICAL EXAMINATION:  The patient is well-developed and well-nourished.  She is oriented to person, time, and place.  Affect is normal.  Blood pressure is 138/85.  Pulse is 59.  HEENT:  Reveals no xanthelasmas.  She has normal extraocular motion.  I do not hear any significant bruits today.  There is no jugular venous  distention.  CARDIAC EXAM:  Reveals an S1 with an S2.  There are no clicks or significant  murmurs.  ABDOMEN:  Soft.  There are no masses or bruits.  There is trace peripheral edema in her right lower extremity.  She has 2+  distal pulses. There are no musculoskeletal deformities.   An EKG shows her old diffuse ST-T wave changes.   PROBLEMS:  1. Coronary  artery disease status post coronary artery bypass graft in      1999.  I believe that she is stable and does not need any further      testing at this point.  2. History of mild chronic shortness of breath.  3. Hypertension, treated.  4. Hyperlipidemia.  She is on a medication.  I would like for her LDL to      be as low as 75-80.  5. History of a cough and a fast related ACE inhibitor.  6. Carotid bruits.  Her last Doppler was done in 2005.  She has mild      disease and the plan was made to consider followup in 2 years.  It is      not time for this study.  The patient's aspirin dose will be decreased      to 81 mg daily and arrangements will  be made for her to have followup      carotid Dopplers; and then I can see her back in followup in 1 year.    ______________________________  Carlena Bjornstad, MD, [facility]    JDK/MedQ  DD: 07/20/2006  DT: 07/20/2006  Job #: [id number]   cc:   Sherald Barge, MD

## 2011-03-02 ENCOUNTER — Encounter: Payer: Self-pay | Admitting: Cardiology

## 2011-04-19 ENCOUNTER — Ambulatory Visit (INDEPENDENT_AMBULATORY_CARE_PROVIDER_SITE_OTHER): Payer: Medicare Other | Admitting: Cardiology

## 2011-04-19 ENCOUNTER — Encounter: Payer: Self-pay | Admitting: Cardiology

## 2011-04-19 DIAGNOSIS — I739 Peripheral vascular disease, unspecified: Secondary | ICD-10-CM

## 2011-04-19 DIAGNOSIS — E785 Hyperlipidemia, unspecified: Secondary | ICD-10-CM | POA: Insufficient documentation

## 2011-04-19 DIAGNOSIS — I2510 Atherosclerotic heart disease of native coronary artery without angina pectoris: Secondary | ICD-10-CM

## 2011-04-19 DIAGNOSIS — I25118 Atherosclerotic heart disease of native coronary artery with other forms of angina pectoris: Secondary | ICD-10-CM | POA: Insufficient documentation

## 2011-04-19 DIAGNOSIS — R55 Syncope and collapse: Secondary | ICD-10-CM

## 2011-04-19 DIAGNOSIS — R0609 Other forms of dyspnea: Secondary | ICD-10-CM | POA: Insufficient documentation

## 2011-04-19 DIAGNOSIS — R42 Dizziness and giddiness: Secondary | ICD-10-CM | POA: Insufficient documentation

## 2011-04-19 DIAGNOSIS — R0602 Shortness of breath: Secondary | ICD-10-CM | POA: Insufficient documentation

## 2011-04-19 DIAGNOSIS — R9430 Abnormal result of cardiovascular function study, unspecified: Secondary | ICD-10-CM | POA: Insufficient documentation

## 2011-04-19 DIAGNOSIS — I779 Disorder of arteries and arterioles, unspecified: Secondary | ICD-10-CM | POA: Insufficient documentation

## 2011-04-19 DIAGNOSIS — I10 Essential (primary) hypertension: Secondary | ICD-10-CM | POA: Insufficient documentation

## 2011-04-19 DIAGNOSIS — K30 Functional dyspepsia: Secondary | ICD-10-CM | POA: Insufficient documentation

## 2011-04-19 NOTE — Assessment & Plan Note (Signed)
I will be looking for the patient's results from March, 2012.  We'll also look into what the difficulties were and be sure we help her with any issues

## 2011-04-19 NOTE — Progress Notes (Signed)
HPI Patient is seen for followup of coronary artery disease.  She has not had any significant chest pain.  She has significant carotid disease.  I am looking for a report from March of 2012.  She relates to me  That there was some confusion concerning billing for her carotid Doppler.  She says that the parties have been apologetic but I'm not sure what the problem was.  First I will have to find the results of the study. Allergies  Allergen Reactions  . Codeine     Current Outpatient Prescriptions  Medication Sig Dispense Refill  . aspirin 81 MG tablet Take 81 mg by mouth daily.        . calcium-vitamin D (SM CALCIUM 500/VITAMIN D3) 500-400 MG-UNIT per tablet Take 1 tablet by mouth daily.        . clopidogrel (PLAVIX) 75 MG tablet Take 75 mg by mouth daily.        . hydrochlorothiazide (,MICROZIDE/HYDRODIURIL,) 12.5 MG capsule Take 12.5 mg by mouth daily.        . multivitamin (THERAGRAN) per tablet Take 1 tablet by mouth daily.        . pantoprazole (PROTONIX) 40 MG tablet Take 40 mg by mouth 2 (two) times daily.        Marland Kitchen PREDNISONE PO Take by mouth. currently has bronchitis       . Psyllium-Calcium (METAMUCIL PLUS CALCIUM) CAPS Take 2 capsules by mouth daily.        . simvastatin (ZOCOR) 40 MG tablet Take 40 mg by mouth at bedtime.        . valsartan (DIOVAN) 320 MG tablet Take 320 mg by mouth daily.          History   Social History  . Marital Status: Married    Spouse Name: N/A    Number of Children: N/A  . Years of Education: N/A   Occupational History  . Not on file.   Social History Main Topics  . Smoking status: Never Smoker   . Smokeless tobacco: Not on file  . Alcohol Use: Yes     yes, social wine  . Drug Use: No  . Sexually Active:    Other Topics Concern  . Not on file   Social History Narrative  . No narrative on file    No family history on file.  Past Medical History  Diagnosis Date  . Carotid artery disease     Doppler May, 2011, progression R. ICA  [id number],, LICA no change, [id number]  . Hyperlipidemia   . Hypertension   . SOB (shortness of breath)   . Dizziness     stable now (Oct 2011) patient tells me question of TIA, we will obtain records  . Syncopal episodes     after 18 holes of golf and increase hydrochlorothiazide, resolved   . Indigestion     3 weeks, October 2011  . CAD (coronary artery disease)     Nuclear October, 2011, not gated, no scar or ischemia  . Ejection fraction     EF normal, nuclear, 2008, no echo data as of April 19, 2011    Past Surgical History  Procedure Date  . Breast biopsy   . Esophagus stretched   . Total abdominal hysterectomy   . Appendectomy   . Coronary artery bypass graft   . Tonsillectomy     ROS  Patient denies fever, chills, headache, sweats, rash, change in vision, change in hearing, chest pain, nausea vomiting,  urinary symptoms.  Patient has had a recent upper respiratory infection has been treated.  She is on tapering steroids.  PHYSICAL EXAM Patient is oriented to person time and place.  Affect is normal.  Head is atraumatic.  There is no xanthelasma.  Lungs reveal a few scattered rhonchi.  There is no jugular venous distention.  Cardiac exam reveals S1-S2.  No click or significant murmurs the abdomen is soft.  There is no peripheral edema. Filed Vitals:   04/19/11 1603  BP: 145/69  Pulse: 59  Height: 5' 0.5" (1.537 m)  Weight: 126 lb (57.153 kg)    EKG Is done today and reviewed by me.  There are mild nonspecific ST-T wave changes.  ASSESSMENT & PLAN

## 2011-04-19 NOTE — Assessment & Plan Note (Signed)
I located the Doppler report from January, 2012.  The patient has 40-59% bilateral disease.  This is stable.  He can be followed up in one year.  I still do not have all the information about some other type of test that she may in [place].

## 2011-04-19 NOTE — Patient Instructions (Signed)
Your physician recommends that you schedule a follow-up appointment in: 6 months  

## 2011-04-19 NOTE — Assessment & Plan Note (Signed)
Coronary disease is stable.  No further workup. 

## 2011-04-19 NOTE — Assessment & Plan Note (Signed)
Patient has had no recurrent syncope.

## 2011-08-01 ENCOUNTER — Other Ambulatory Visit: Payer: Self-pay | Admitting: Cardiology

## 2011-08-01 DIAGNOSIS — I6529 Occlusion and stenosis of unspecified carotid artery: Secondary | ICD-10-CM

## 2011-08-02 ENCOUNTER — Encounter (INDEPENDENT_AMBULATORY_CARE_PROVIDER_SITE_OTHER): Payer: Medicare Other | Admitting: *Deleted

## 2011-08-02 DIAGNOSIS — I6529 Occlusion and stenosis of unspecified carotid artery: Secondary | ICD-10-CM

## 2011-08-02 DIAGNOSIS — R42 Dizziness and giddiness: Secondary | ICD-10-CM

## 2011-08-05 ENCOUNTER — Encounter: Payer: Self-pay | Admitting: Cardiology

## 2011-08-10 ENCOUNTER — Ambulatory Visit: Payer: Medicare Other | Admitting: Internal Medicine

## 2011-09-16 ENCOUNTER — Ambulatory Visit (INDEPENDENT_AMBULATORY_CARE_PROVIDER_SITE_OTHER): Payer: Medicare Other | Admitting: Internal Medicine

## 2011-09-16 ENCOUNTER — Other Ambulatory Visit: Payer: Self-pay | Admitting: Internal Medicine

## 2011-09-16 ENCOUNTER — Encounter: Payer: Self-pay | Admitting: Internal Medicine

## 2011-09-16 VITALS — BP 150/74 | HR 72 | Temp 98.0°F | Wt 127.0 lb

## 2011-09-16 DIAGNOSIS — Z23 Encounter for immunization: Secondary | ICD-10-CM

## 2011-09-16 DIAGNOSIS — G4700 Insomnia, unspecified: Secondary | ICD-10-CM

## 2011-09-16 DIAGNOSIS — R5383 Other fatigue: Secondary | ICD-10-CM

## 2011-09-16 DIAGNOSIS — E538 Deficiency of other specified B group vitamins: Secondary | ICD-10-CM

## 2011-09-16 DIAGNOSIS — I10 Essential (primary) hypertension: Secondary | ICD-10-CM

## 2011-09-16 DIAGNOSIS — E785 Hyperlipidemia, unspecified: Secondary | ICD-10-CM

## 2011-09-16 DIAGNOSIS — R5381 Other malaise: Secondary | ICD-10-CM

## 2011-09-16 MED ORDER — ALPRAZOLAM 0.25 MG PO TABS: 0.2500 mg | ORAL_TABLET | Freq: Two times a day (BID) | ORAL | Status: DC | PRN

## 2011-09-16 MED ORDER — VALSARTAN-HYDROCHLOROTHIAZIDE 320-12.5 MG PO TABS: 1.0000 | ORAL_TABLET | Freq: Every day | ORAL | Status: DC

## 2011-09-16 NOTE — Progress Notes (Signed)
Subjective:    Patient ID: [name], female    DOB: 09-Oct-1925, 77 y.o.   MRN: [id number]  HPI [name] is an 77 yo white female with a history of CAD, 4 vessel bypass surgery in 1999,  With no subsequent events PTCAs since then, who is here to establish primary care.  Her former PCP  [name], is leaving. She sees her cardiologist every 6 months , Dr. Ron Allen, and has had no recent episodes of chest pain, orthopnea, LE edema or exertional dyspnea.  She feels, in general, well.  She has a history of migraine headaches byr has had none since her hysterectomy . Screening for breast cancer is done by Dr. Jamal Allen due to a  prior benign breast biopsy done over 10 yrs ago.  She physically quite active and independent with ADLS.  She golfs twice weekly and does other weight bearing exercise at least 4 times weekly.  Her only complaint today is chronic insomnia, which has been present since her husband of over 56 years died 45 years ago.   She has sleep latency of about 30 minutes and awakens around 3 to 4 am every night, and is often unable to fall asleep.  Does not watch TV in bed or drink alcohol except wine with dinner. Has tried OTC sleeping aids without much success.  Past Medical History  Diagnosis Date  . Carotid artery disease     Doppler January, 2012, 40-59% bilateral, followup one year  . Hyperlipidemia   . Hypertension   . SOB (shortness of breath)   . Dizziness     stable now (Oct 2011) patient tells me question of TIA, we will obtain records  . Syncopal episodes     after 18 holes of golf and increase hydrochlorothiazide, resolved   . Indigestion     3 weeks, October 2011  . CAD (coronary artery disease)     Nuclear October, 2011, not gated, no scar or ischemia  . Ejection fraction     EF normal, nuclear, 2008, no echo data as of April 19, 2011  . Rheumatic fever   . History of migraines   . Kidney stones   . GERD (gastroesophageal reflux disease)   . Diverticulitis       Current Outpatient Prescriptions on File Prior to Visit  Medication Sig Dispense Refill  . aspirin 81 MG tablet Take 81 mg by mouth daily.        . calcium-vitamin D (SM CALCIUM 500/VITAMIN D3) 500-400 MG-UNIT per tablet Take 1 tablet by mouth daily.        . clopidogrel (PLAVIX) 75 MG tablet Take 75 mg by mouth daily.        . multivitamin (THERAGRAN) per tablet Take 1 tablet by mouth daily.        . pantoprazole (PROTONIX) 40 MG tablet Take 40 mg by mouth 2 (two) times daily.        . simvastatin (ZOCOR) 40 MG tablet Take 40 mg by mouth at bedtime.        Marland Kitchen PREDNISONE PO Take by mouth. currently has bronchitis       . Psyllium-Calcium (METAMUCIL PLUS CALCIUM) CAPS Take 2 capsules by mouth daily.           Review of Systems  Constitutional: Negative for fever, chills and unexpected weight change.  HENT: Negative for hearing loss, ear pain, nosebleeds, congestion, sore throat, facial swelling, rhinorrhea, sneezing, mouth sores, trouble swallowing, neck pain, neck stiffness,  voice change, postnasal drip, sinus pressure, tinnitus and ear discharge.   Eyes: Negative for pain, discharge, redness and visual disturbance.  Respiratory: Negative for cough, chest tightness, shortness of breath, wheezing and stridor.   Cardiovascular: Negative for chest pain, palpitations and leg swelling.  Musculoskeletal: Negative for myalgias and arthralgias.  Skin: Negative for color change and rash.  Neurological: Negative for dizziness, weakness, light-headedness and headaches.  Hematological: Negative for adenopathy.  Psychiatric/Behavioral: Positive for sleep disturbance.       Objective:   Physical Exam  Constitutional: She is oriented to person, place, and time. She appears well-developed and well-nourished.  HENT:  Mouth/Throat: Oropharynx is clear and moist.  Eyes: EOM are normal. Pupils are equal, round, and reactive to light. No scleral icterus.  Neck: Normal range of motion. Neck supple.  No JVD present. No thyromegaly present.  Cardiovascular: Normal rate, regular rhythm, normal heart sounds and intact distal pulses.   Pulmonary/Chest: Effort normal and breath sounds normal.  Abdominal: Soft. Bowel sounds are normal. She exhibits no mass. There is no tenderness.  Musculoskeletal: Normal range of motion. She exhibits no edema.  Lymphadenopathy:    She has no cervical adenopathy.  Neurological: She is alert and oriented to person, place, and time.  Skin: Skin is warm and dry.  Psychiatric: She has a normal mood and affect.          Assessment & Plan:

## 2011-09-16 NOTE — Patient Instructions (Signed)
I am prescribing alprazolam to help you sleep.  Do no takde more than 1 or 2 at bedtime to help you rest.   We will call you with the results of your blood tests.

## 2011-09-18 ENCOUNTER — Encounter: Payer: Self-pay | Admitting: Internal Medicine

## 2011-09-18 DIAGNOSIS — G4700 Insomnia, unspecified: Secondary | ICD-10-CM | POA: Insufficient documentation

## 2011-09-18 NOTE — Assessment & Plan Note (Signed)
Managed with Simvastatin.  Fasting lipidshave not been done in 6 months and have been ordered

## 2011-09-18 NOTE — Assessment & Plan Note (Signed)
Her former PCP was unwilling to prescribe any medications.  We discused the side effects of various sedating medications and decided on a trial of alprazolam.

## 2011-09-18 NOTE — Assessment & Plan Note (Signed)
bp is elevated today, but she states that her home checks are typically < 140/80.  No changes today

## 2011-09-21 DIAGNOSIS — E538 Deficiency of other specified B group vitamins: Secondary | ICD-10-CM | POA: Diagnosis not present

## 2011-09-21 DIAGNOSIS — R5383 Other fatigue: Secondary | ICD-10-CM | POA: Diagnosis not present

## 2011-09-21 DIAGNOSIS — I10 Essential (primary) hypertension: Secondary | ICD-10-CM | POA: Diagnosis not present

## 2011-09-21 DIAGNOSIS — R5381 Other malaise: Secondary | ICD-10-CM | POA: Diagnosis not present

## 2011-09-22 LAB — COMPREHENSIVE METABOLIC PANEL
ALT: 14 U/L (ref 0–35)
AST: 21 U/L (ref 0–37)
Albumin: 4.4 g/dL (ref 3.5–5.2)
Alkaline Phosphatase: 51 U/L (ref 39–117)
BUN: 30 mg/dL — ABNORMAL HIGH (ref 6–23)
Calcium: 10.2 mg/dL (ref 8.4–10.5)
Chloride: 101 mEq/L (ref 96–112)
Potassium: 4.3 mEq/L (ref 3.5–5.3)

## 2011-09-25 ENCOUNTER — Encounter: Payer: Self-pay | Admitting: Internal Medicine

## 2011-09-25 DIAGNOSIS — N183 Chronic kidney disease, stage 3 (moderate): Secondary | ICD-10-CM

## 2011-09-25 DIAGNOSIS — N1832 Chronic kidney disease, stage 3b: Secondary | ICD-10-CM | POA: Insufficient documentation

## 2011-10-24 ENCOUNTER — Encounter: Payer: Self-pay | Admitting: Cardiology

## 2011-10-24 ENCOUNTER — Ambulatory Visit (INDEPENDENT_AMBULATORY_CARE_PROVIDER_SITE_OTHER): Payer: Medicare Other | Admitting: Cardiology

## 2011-10-24 VITALS — BP 134/68 | HR 62 | Ht 63.0 in | Wt 132.0 lb

## 2011-10-24 DIAGNOSIS — I2510 Atherosclerotic heart disease of native coronary artery without angina pectoris: Secondary | ICD-10-CM | POA: Diagnosis not present

## 2011-10-24 DIAGNOSIS — Z951 Presence of aortocoronary bypass graft: Secondary | ICD-10-CM | POA: Insufficient documentation

## 2011-10-24 NOTE — Progress Notes (Signed)
HPI Patient is seen today for cardiology followup. She has not been having any significant problems. I saw her last July, 2012. We have been following her carotid disease.  She had some problems with her ears and her ENT doctor arrange for followup Dopplers. These were done in November, 2012. There was no significant change. We need to be sure that she is taken out of the automatic followup that was to come in March of this year. She should have her next Doppler in November of 2013. Allergies  Allergen Reactions  . Codeine     Current Outpatient Prescriptions  Medication Sig Dispense Refill  . aspirin 81 MG tablet Take 81 mg by mouth daily.        . calcium-vitamin D (SM CALCIUM 500/VITAMIN D3) 500-400 MG-UNIT per tablet Take 1 tablet by mouth daily.        . clopidogrel (PLAVIX) 75 MG tablet Take 75 mg by mouth daily.        . multivitamin (THERAGRAN) per tablet Take 1 tablet by mouth daily.        . pantoprazole (PROTONIX) 40 MG tablet Take 40 mg by mouth 2 (two) times daily.        . Psyllium-Calcium (METAMUCIL PLUS CALCIUM) CAPS Take 2 capsules by mouth daily.        . simvastatin (ZOCOR) 40 MG tablet Take 40 mg by mouth at bedtime.        . valsartan-hydrochlorothiazide (DIOVAN HCT) 320-12.5 MG per tablet Take 1 tablet by mouth daily.  30 tablet  5    History   Social History  . Marital Status: Married    Spouse Name: N/A    Number of Children: N/A  . Years of Education: N/A   Occupational History  . Not on file.   Social History Main Topics  . Smoking status: Former Research scientist (life sciences)  . Smokeless tobacco: Not on file  . Alcohol Use: Yes     yes, social wine  . Drug Use: No  . Sexually Active: Not on file   Other Topics Concern  . Not on file   Social History Narrative  . No narrative on file    Family History  Problem Relation Age of Onset  . Heart disease Mother   . Heart disease Father   . Cancer Neg Hx   . Drug abuse Neg Hx     Past Medical History  Diagnosis Date    . Carotid artery disease     Doppler January, 2012, 40-59% bilateral, followup one year  . Hyperlipidemia   . Hypertension   . SOB (shortness of breath)   . Dizziness     stable now (Oct 2011) patient tells me question of TIA, we will obtain records  . Syncopal episodes     after 18 holes of golf and increase hydrochlorothiazide, resolved   . Indigestion     3 weeks, October 2011  . CAD (coronary artery disease)     Nuclear October, 2011, not gated, no scar or ischemia  . Ejection fraction     EF normal, nuclear, 2008, no echo data as of April 19, 2011  . Rheumatic fever   . History of migraines   . Kidney stones   . GERD (gastroesophageal reflux disease)   . Diverticulitis     Past Surgical History  Procedure Date  . Breast biopsy 1970  . Esophagus stretched   . Appendectomy 1950  . Coronary artery bypass graft 1999  .  Tonsillectomy 1939  . Total abdominal hysterectomy 1968    due to metrorrhagia    ROS  Patient denies fever, chills, headache, sweats, rash, change in vision, change in hearing, chest pain, cough, nausea vomiting, urinary symptoms. She had some problems with her ears after flying. This is now improved. All other systems are reviewed and are negative.  PHYSICAL EXAM Patient looks quite good today. She is oriented to person time and place. Affect is normal. There is no jugulovenous distention. She does have a right carotid bruit. Lungs are clear. Respiratory effort is nonlabored. Cardiac exam reveals S1 and S2. There are no clicks or significant murmurs. The abdomen is soft. There's no peripheral edema. Filed Vitals:   10/24/11 1136  BP: 134/68  Pulse: 62  Height: 5\' 3"  (1.6 m)  Weight: 132 lb (59.875 kg)    EKG EKG is done today and reviewed by me. I have also compared to old tracings. She has low voltage. This has been seen in the past. There are nonspecific ST-T wave changes. There is normal sinus rhythm.  ASSESSMENT & PLAN

## 2011-10-24 NOTE — Assessment & Plan Note (Signed)
Coronary disease is stable. She does not need further workup at this time.

## 2011-10-24 NOTE — Assessment & Plan Note (Signed)
Blood pressure is controlled. No change in therapy. 

## 2011-10-24 NOTE — Patient Instructions (Signed)
Your physician wants you to follow-up in: 6 months.  You will receive a reminder letter in the mail two months in advance. If you don't receive a letter, please call our office to schedule the follow-up appointment.  Your physician has requested that you have a carotid duplex. This test is an ultrasound of the carotid arteries in your neck. It looks at blood flow through these arteries that supply the brain with blood. Allow one hour for this exam. There are no restrictions or special instructions.  You need to have this in November 2013

## 2011-10-24 NOTE — Assessment & Plan Note (Signed)
The patient had had a Doppler in November, 2012. This was ordered a little bit earlier than originally planned because she had some marked dizziness. The dizziness has resolved. We will now change her scheduling cycle so that her next Doppler will be in November, 2012.

## 2011-11-02 ENCOUNTER — Telehealth: Payer: Self-pay | Admitting: Internal Medicine

## 2011-11-02 ENCOUNTER — Ambulatory Visit: Payer: Self-pay | Admitting: General Surgery

## 2011-11-02 DIAGNOSIS — Z1231 Encounter for screening mammogram for malignant neoplasm of breast: Secondary | ICD-10-CM | POA: Diagnosis not present

## 2011-11-02 NOTE — Telephone Encounter (Signed)
Appt made for 2/15.

## 2011-11-02 NOTE — Telephone Encounter (Signed)
Patient feeling dizzy would like to be seen in the office.

## 2011-11-04 ENCOUNTER — Other Ambulatory Visit: Payer: Self-pay | Admitting: *Deleted

## 2011-11-04 ENCOUNTER — Ambulatory Visit (INDEPENDENT_AMBULATORY_CARE_PROVIDER_SITE_OTHER): Payer: Medicare Other | Admitting: Internal Medicine

## 2011-11-04 ENCOUNTER — Encounter: Payer: Self-pay | Admitting: Internal Medicine

## 2011-11-04 DIAGNOSIS — R42 Dizziness and giddiness: Secondary | ICD-10-CM

## 2011-11-04 DIAGNOSIS — I779 Disorder of arteries and arterioles, unspecified: Secondary | ICD-10-CM

## 2011-11-04 MED ORDER — ALPRAZOLAM 0.25 MG PO TABS: 0.2500 mg | ORAL_TABLET | Freq: Every evening | ORAL | Status: DC | PRN

## 2011-11-04 MED ORDER — MECLIZINE HCL 32 MG PO TABS: 32.0000 mg | ORAL_TABLET | Freq: Three times a day (TID) | ORAL | Status: AC | PRN

## 2011-11-04 MED ORDER — ALPRAZOLAM 0.25 MG PO TABS: ORAL_TABLET | ORAL | Status: DC

## 2011-11-04 NOTE — Telephone Encounter (Signed)
Faxed request from [facility], last filled 09/16/11.

## 2011-11-04 NOTE — Telephone Encounter (Signed)
Medicine called to pharmacy. 

## 2011-11-04 NOTE — Progress Notes (Signed)
Subjective:    Patient ID: [name], female    DOB: 04-24-1926, 78 y.o.   MRN: [id number]  HPI  is the first is an 78 year old white female with a history of chronic dizziness diverticulosis esophageal reflux with prior stricture hypertension and hyperlipidemia who presents today with a five-day history of of the recurrent dizziness without true vertigo. Patient states her symptoms have been present since Tuesday. They are alleviated with reclining supine position but are present with upright position. She has had no falls and no true vertigo spells. She denies headache numbness or tingling focal or generalized weakness and denies sinus symptoms. She has had an extensive workup in the past both involving ENT and neurology with MRI. There is nothing different about her current episode than her prior episodes.  Past Medical History  Diagnosis Date  . Carotid artery disease     Doppler January, 2012, 40-59% bilateral, followup one year  . Hyperlipidemia   . Hypertension   . SOB (shortness of breath)   . Dizziness     stable now (Oct 2011) patient tells me question of TIA, we will obtain records  . Syncopal episodes     after 18 holes of golf and increase hydrochlorothiazide, resolved   . Indigestion     3 weeks, October 2011  . CAD (coronary artery disease)     Nuclear October, 2011, not gated, no scar or ischemia  . Ejection fraction     EF normal, nuclear, 2008, no echo data as of April 19, 2011  . Rheumatic fever   . History of migraines   . Kidney stones   . GERD (gastroesophageal reflux disease)   . Diverticulitis     last colonoscopy  incomplete Jan 2011  . Hx of CABG     1999   Current Outpatient Prescriptions on File Prior to Visit  Medication Sig Dispense Refill  . aspirin 81 MG tablet Take 81 mg by mouth daily.        . calcium-vitamin D (SM CALCIUM 500/VITAMIN D3) 500-400 MG-UNIT per tablet Take 1 tablet by mouth daily.        . clopidogrel (PLAVIX) 75 MG tablet  Take 75 mg by mouth daily.        . multivitamin (THERAGRAN) per tablet Take 1 tablet by mouth daily.        . pantoprazole (PROTONIX) 40 MG tablet Take 40 mg by mouth 2 (two) times daily.        . Psyllium-Calcium (METAMUCIL PLUS CALCIUM) CAPS Take 2 capsules by mouth daily.        . simvastatin (ZOCOR) 40 MG tablet Take 40 mg by mouth at bedtime.        . valsartan-hydrochlorothiazide (DIOVAN HCT) 320-12.5 MG per tablet Take 1 tablet by mouth daily.  30 tablet  5    Review of Systems  Constitutional: Negative for fever, chills and unexpected weight change.  HENT: Negative for hearing loss, ear pain, nosebleeds, congestion, sore throat, facial swelling, rhinorrhea, sneezing, mouth sores, trouble swallowing, neck pain, neck stiffness, voice change, postnasal drip, sinus pressure, tinnitus and ear discharge.   Eyes: Negative for pain, discharge, redness and visual disturbance.  Respiratory: Negative for cough, chest tightness, shortness of breath, wheezing and stridor.   Cardiovascular: Negative for chest pain, palpitations and leg swelling.  Musculoskeletal: Negative for myalgias and arthralgias.  Skin: Negative for color change and rash.  Neurological: Positive for dizziness and light-headedness. Negative for weakness and headaches.  Hematological:  Negative for adenopathy.       Objective:   Physical Exam  Constitutional: She is oriented to person, place, and time. She appears well-developed and well-nourished.  HENT:  Mouth/Throat: Oropharynx is clear and moist.  Eyes: EOM are normal. Pupils are equal, round, and reactive to light. No scleral icterus.  Neck: Normal range of motion. Neck supple. No JVD present. No thyromegaly present.  Cardiovascular: Normal rate, regular rhythm, normal heart sounds and intact distal pulses.   Pulmonary/Chest: Effort normal and breath sounds normal.  Abdominal: Soft. Bowel sounds are normal. She exhibits no mass. There is no tenderness.    Musculoskeletal: Normal range of motion. She exhibits no edema.  Lymphadenopathy:    She has no cervical adenopathy.  Neurological: She is alert and oriented to person, place, and time.  Skin: Skin is warm and dry.  Psychiatric: She has a normal mood and affect.          Assessment & Plan:

## 2011-11-06 ENCOUNTER — Encounter: Payer: Self-pay | Admitting: Internal Medicine

## 2011-11-06 DIAGNOSIS — K5792 Diverticulitis of intestine, part unspecified, without perforation or abscess without bleeding: Secondary | ICD-10-CM | POA: Insufficient documentation

## 2011-11-06 NOTE — Assessment & Plan Note (Signed)
Neurologic exam is normal and she is not orthostatic by my check of her blood pressure and pulse. She has a normal ENT exam today as well. Reassurance provided. I have offered to refer her to the hospital for the [facility] vestibular rehabilitation program and have resumed her Antivert for symptom control.

## 2011-11-10 DIAGNOSIS — N6019 Diffuse cystic mastopathy of unspecified breast: Secondary | ICD-10-CM | POA: Diagnosis not present

## 2011-11-28 ENCOUNTER — Ambulatory Visit (INDEPENDENT_AMBULATORY_CARE_PROVIDER_SITE_OTHER): Payer: Medicare Other | Admitting: Internal Medicine

## 2011-11-28 ENCOUNTER — Telehealth: Payer: Self-pay | Admitting: Internal Medicine

## 2011-11-28 ENCOUNTER — Encounter: Payer: Self-pay | Admitting: Internal Medicine

## 2011-11-28 VITALS — BP 122/64 | HR 97 | Temp 99.8°F | Resp 14 | Wt 131.5 lb

## 2011-11-28 DIAGNOSIS — J988 Other specified respiratory disorders: Secondary | ICD-10-CM | POA: Diagnosis not present

## 2011-11-28 DIAGNOSIS — J22 Unspecified acute lower respiratory infection: Secondary | ICD-10-CM

## 2011-11-28 MED ORDER — HYDROCOD POLST-CHLORPHEN POLST 10-8 MG/5ML PO LQCR: 5.0000 mL | Freq: Two times a day (BID) | ORAL | Status: DC | PRN

## 2011-11-28 MED ORDER — AMOXICILLIN-POT CLAVULANATE 875-125 MG PO TABS: 1.0000 | ORAL_TABLET | Freq: Two times a day (BID) | ORAL | Status: DC

## 2011-11-28 NOTE — Telephone Encounter (Signed)
Patient has an appt to be seen today.

## 2011-11-28 NOTE — Progress Notes (Signed)
  Subjective:    Patient ID: [name], female    DOB: [birth year], 78 y.o.   MRN: [id number]  HPI  78 yr old white female  With history of  Presents with 3 day history of nonproductive cough and fevers to 102.  No recent travel or known sick contacts.  Had a refill on a Zpack given to her previously by prior PCP and starting taking it on Friday with no significant change to symptoms Cough is not controlled with cheratussin.     Review of Systems  Constitutional: Negative for fever, chills and unexpected weight change.  HENT: Negative for hearing loss, ear pain, nosebleeds, congestion, sore throat, facial swelling, rhinorrhea, sneezing, mouth sores, trouble swallowing, neck pain, neck stiffness, voice change, postnasal drip, sinus pressure, tinnitus and ear discharge.   Eyes: Negative for pain, discharge, redness and visual disturbance.  Respiratory: Positive for cough. Negative for chest tightness, shortness of breath, wheezing and stridor.   Cardiovascular: Negative for chest pain, palpitations and leg swelling.  Musculoskeletal: Negative for myalgias and arthralgias.  Skin: Negative for color change and rash.  Neurological: Positive for headaches. Negative for dizziness, weakness and light-headedness.  Hematological: Negative for adenopathy.       Objective:   Physical Exam  Constitutional: She is oriented to person, place, and time. She appears well-developed and well-nourished.  HENT:  Mouth/Throat: Oropharynx is clear and moist.  Eyes: EOM are normal. Pupils are equal, round, and reactive to light. No scleral icterus.  Neck: Normal range of motion. Neck supple. No JVD present. No thyromegaly present.  Cardiovascular: Normal rate, regular rhythm, normal heart sounds and intact distal pulses.   Pulmonary/Chest: Effort normal and breath sounds normal.  Abdominal: Soft. Bowel sounds are normal. She exhibits no mass. There is no tenderness.  Musculoskeletal: Normal range of  motion. She exhibits no edema.  Lymphadenopathy:    She has no cervical adenopathy.  Neurological: She is alert and oriented to person, place, and time.  Skin: Skin is warm and dry.  Psychiatric: She has a normal mood and affect.       Assessment & Plan:    LRI:  symptoms present for 3-4 days , with continued fevers despite use of Z pack Day 3.  Will change to augmentin.  She is not wheezing on exam today and has no egophony so will not obtain CXR unless she continues to have fevers after 24 hrs of change of antibiotic therapy. Cough medication  changed to tussionex

## 2011-11-28 NOTE — Telephone Encounter (Signed)
[facility] Triage Call Report Triage Record Num: [id number] Operator: Leota Sauers Patient Name: [name] Call Date & Time: 11/28/2011 8:48:18AM Patient Phone: [phone] PCP: Deborra Medina Patient Gender: Female PCP Fax : [phone] Patient DOB: [birth year] Practice Name: [facility] Reason for Call: Caller: Buena/Patient; PCP: Deborra Medina; CB#: [phone]; ; ; Call regarding Cough/Congestion onset 11/25/11; Febrile/ up to 102 on 11/27/11. On Z-pack since 11/25/11. Yellowish sputum. Advised see in 24 hours per Cough protocol and nursing judgment. Home care for the interim and parameters for callback given. Contacted Vanessa at office for assistance w/ appt; scheduled at 11:30 w/ Dr. Derrel Nip. Protocol(s) Used: Cough - Adult Recommended Outcome per Protocol: Call Provider Immediately Reason for Outcome: Any temperature elevation in an immunocompromised individual or a frail elderly person Care Advice: ~ Another adult should drive. ~ CAUTIONS ~ List, or take, all current prescription(s), nonprescription or alternative medication(s) to provider for evaluation. Analgesic/Antipyretic Advice - Acetaminophen: Consider acetaminophen as directed on label or by pharmacist/provider for pain or fever PRECAUTIONS: - Use if there is no history of liver disease, alcoholism, or intake of three or more alcohol drinks per day - Only if approved by provider during pregnancy or when breastfeeding - During pregnancy, acetaminophen should not be taken more than 3 consecutive days without telling provider - Do not exceed recommended dose or frequency ~ Systemic Inflammatory Response Syndrome (SIRS): Watch for signs of a generalized, whole body infection. Occurs within days of a localized infection, especially of the urinary, GI, respiratory or nervous systems; or after a traumatic injury or invasive procedure. - Call EMS 911 if symptoms have worsened, such as increasing confusion  or unusual drowsiness; cold and clammy skin; no urine output; rapid respiration (>30/min.) or slow respiration (<10/min.); struggling to breathe. - Go to the ED immediately for early symptoms of rapid pulse >90/min. or rapid breathing >20/min. at rest; chills; oral temperature >100.4 F (38 C) or <96.8 F (36 C) when associated with conditions noted. ~ Respiratory Hygiene: - Cover the nose/mouth tightly with a tissue when coughing or sneezing. - Use tissue 1 time and discard in the nearest waste receptacle. - Wash hands with soap and water or alcohol-based hand rub after coming into contact with respiratory secretions and contaminated objects/materials. - Alternatively when no tissue is available, cough into the bend of the elbow. - .Avoid touching your eyes, nose or mouth. ~ 11/28/2011 9:03:50AM Page 1 of 1 [id number]

## 2011-11-28 NOTE — Patient Instructions (Signed)
Do not take the azithromycin any more.  Start the amoxicillin tonight with dinner.  Take with food twice daily for 7 days.  I am prescribing a stronger cough medicine,  Tussionex, to take every 12 hours or just at bedtime.  It is sedating .    Drink lots of fluids ,   Try mucinex (or guaifenesin 600 mg every 12 hours ) to break up the cough.

## 2011-12-05 ENCOUNTER — Ambulatory Visit (INDEPENDENT_AMBULATORY_CARE_PROVIDER_SITE_OTHER): Payer: Medicare Other | Admitting: Internal Medicine

## 2011-12-05 ENCOUNTER — Ambulatory Visit (INDEPENDENT_AMBULATORY_CARE_PROVIDER_SITE_OTHER)
Admission: RE | Admit: 2011-12-05 | Discharge: 2011-12-05 | Disposition: A | Discharge status: Home / Self Care | Payer: Medicare Other | Source: Ambulatory Visit | Attending: Internal Medicine | Admitting: Internal Medicine

## 2011-12-05 ENCOUNTER — Telehealth: Payer: Self-pay | Admitting: Internal Medicine

## 2011-12-05 ENCOUNTER — Encounter: Payer: Self-pay | Admitting: Internal Medicine

## 2011-12-05 VITALS — BP 120/74 | HR 96 | Temp 98.3°F | Resp 14 | Wt 124.2 lb

## 2011-12-05 DIAGNOSIS — R0602 Shortness of breath: Secondary | ICD-10-CM

## 2011-12-05 DIAGNOSIS — J449 Chronic obstructive pulmonary disease, unspecified: Secondary | ICD-10-CM | POA: Insufficient documentation

## 2011-12-05 DIAGNOSIS — J441 Chronic obstructive pulmonary disease with (acute) exacerbation: Secondary | ICD-10-CM

## 2011-12-05 DIAGNOSIS — R918 Other nonspecific abnormal finding of lung field: Secondary | ICD-10-CM | POA: Diagnosis not present

## 2011-12-05 MED ORDER — METHYLPREDNISOLONE ACETATE PF 40 MG/ML IJ SUSP
40.0000 mg | Freq: Once | INTRAMUSCULAR | Status: AC
  Administered 2011-12-05: 40 mg via INTRAMUSCULAR

## 2011-12-05 MED ORDER — ALBUTEROL SULFATE HFA 108 (90 BASE) MCG/ACT IN AERS: 2.0000 | INHALATION_SPRAY | Freq: Four times a day (QID) | RESPIRATORY_TRACT | Status: DC | PRN

## 2011-12-05 MED ORDER — PREDNISONE (PAK) 10 MG PO TABS: ORAL_TABLET | ORAL | Status: AC

## 2011-12-05 MED ORDER — ONDANSETRON HCL 4 MG PO TABS: 4.0000 mg | ORAL_TABLET | Freq: Three times a day (TID) | ORAL | Status: DC | PRN

## 2011-12-05 NOTE — Progress Notes (Signed)
Patient ID: [name], female   DOB: 03-08-1926, 78 y.o.   MRN: [id number]     Patient Active Problem List  Diagnoses  . Hyperlipidemia  . Hypertension  . SOB (shortness of breath)  . Dizziness  . Syncopal episodes  . Indigestion  . CAD (coronary artery disease)  . Ejection fraction  . Carotid artery disease  . Insomnia, persistent  . Chronic kidney disease (CKD), stage II (mild)  . Hx of CABG  . Diverticulitis  . COPD (chronic obstructive pulmonary disease)    Subjective:  CC:   Chief Complaint  Patient presents with  . Fatigue    not feeling better    HPI:   Katrina Clendenning McPhersonis a 78 y.o. female who presents with malaise and weakness.  She was treated on 3/11 for 3 day history of nonproductive cough and fevers to 102.  Changed freom azithromycin to augmentin and tussionex for cough control..  No longer having fevers,  Everything tastes bad so she has lost her appetite.   Not walking very much,  Spending most of the day in bed. Cough is now productive of clear sputum.  Has advair but has not used it in years.  Told she had COPD years ago but has had no pulmonology follow up in 10 years.    Past Medical History  Diagnosis Date  . Carotid artery disease     Doppler January, 2012, 40-59% bilateral, followup one year  . Hyperlipidemia   . Hypertension   . SOB (shortness of breath)   . Dizziness     stable now (Oct 2011) patient tells me question of TIA, we will obtain records  . Syncopal episodes     after 18 holes of golf and increase hydrochlorothiazide, resolved   . Indigestion     3 weeks, October 2011  . CAD (coronary artery disease)     Nuclear October, 2011, not gated, no scar or ischemia  . Ejection fraction     EF normal, nuclear, 2008, no echo data as of April 19, 2011  . Rheumatic fever   . History of migraines   . Kidney stones   . GERD (gastroesophageal reflux disease)   . Diverticulitis     last colonoscopy  incomplete Jan 2011  . Hx of CABG      1999    Past Surgical History  Procedure Date  . Breast biopsy 1970  . Esophagus stretched   . Appendectomy 1950  . Coronary artery bypass graft 1999  . Tonsillectomy 1939  . Total abdominal hysterectomy 1968    due to metrorrhagia       . methylPREDNISolone acetate PF  40 mg Intramuscular Once     The following portions of the patient's history were reviewed and updated as appropriate: Allergies, current medications, and problem list.    Review of Systems:   12 Pt  review of systems was negative except those addressed in the HPI,     History   Social History  . Marital Status: Married    Spouse Name: N/A    Number of Children: N/A  . Years of Education: N/A   Occupational History  . Not on file.   Social History Main Topics  . Smoking status: Former Research scientist (life sciences)  . Smokeless tobacco: Not on file  . Alcohol Use: Yes     yes, social wine  . Drug Use: No  . Sexually Active: Not on file   Other Topics Concern  . Not  on file   Social History Narrative  . No narrative on file    Objective:  BP 120/74  Pulse 96  Temp(Src) 98.3 F (36.8 C) (Oral)  Resp 14  Wt 124 lb 4 oz (56.359 kg)  SpO2 94%  General appearance: alert, cooperative and appears stated age Ears: normal TM's and external ear canals both ears Throat: lips, mucosa, and tongue normal; teeth and gums normal Neck: no adenopathy, no carotid bruit, supple, symmetrical, trachea midline and thyroid not enlarged, symmetric, no tenderness/mass/nodules Back: symmetric, no curvature. ROM normal. No CVA tenderness. Lungs: clear to auscultation bilaterally Heart: regular rate and rhythm, S1, S2 normal, no murmur, click, rub or gallop Abdomen: soft, non-tender; bowel sounds normal; no masses,  no organomegaly Pulses: 2+ and symmetric Skin: Skin color, texture, turgor normal. No rashes or lesions Lymph nodes: Cervical, supraclavicular, and axillary nodes normal.  Assessment and Plan:  SOB  (shortness of breath) Secondary to deconditioning and COPD.  sats drop to 92 briefly and reboud to 94, on room air with ambulation.  Will start a prednisone taper.   Recommending cardiopulmonary rehab.  COPD (chronic obstructive pulmonary disease) Secondary to prior tobacco abuse.  Resume Advair,  sprivia initiated with samples,  Adding albuterol for prn use.  Referral to Dr. Lake Bells for PFTS/formal evaluation    Updated Medication List Outpatient Encounter Prescriptions as of 12/05/2011  Medication Sig Dispense Refill  . ALPRAZolam (XANAX) 0.25 MG tablet Take one tablet twice a day as needed for sleep or anxiety.  30 tablet  3  . amoxicillin-clavulanate (AUGMENTIN) 875-125 MG per tablet Take 1 tablet by mouth 2 (two) times daily.  14 tablet  0  . aspirin 81 MG tablet Take 81 mg by mouth daily.        . calcium-vitamin D (SM CALCIUM 500/VITAMIN D3) 500-400 MG-UNIT per tablet Take 1 tablet by mouth daily.        . chlorpheniramine-HYDROcodone (TUSSIONEX PENNKINETIC ER) 10-8 MG/5ML LQCR Take 5 mLs by mouth every 12 (twelve) hours as needed.  140 mL  0  . clopidogrel (PLAVIX) 75 MG tablet Take 75 mg by mouth daily.        . multivitamin (THERAGRAN) per tablet Take 1 tablet by mouth daily.        . pantoprazole (PROTONIX) 40 MG tablet Take 40 mg by mouth 2 (two) times daily.        . Psyllium-Calcium (METAMUCIL PLUS CALCIUM) CAPS Take 2 capsules by mouth daily.        . simvastatin (ZOCOR) 40 MG tablet Take 40 mg by mouth at bedtime.        . valsartan-hydrochlorothiazide (DIOVAN HCT) 320-12.5 MG per tablet Take 1 tablet by mouth daily.  30 tablet  5  . albuterol (PROVENTIL HFA;VENTOLIN HFA) 108 (90 BASE) MCG/ACT inhaler Inhale 2 puffs into the lungs every 6 (six) hours as needed for wheezing.  3.7 g  11  . ondansetron (ZOFRAN) 4 MG tablet Take 1 tablet (4 mg total) by mouth every 8 (eight) hours as needed for nausea.  20 tablet  0   Facility-Administered Encounter Medications as of 12/05/2011    Medication Dose Route Frequency Provider Last Rate Last Dose  . methylPREDNISolone acetate PF (DEPO-MEDROL) injection 40 mg  40 mg Intramuscular Once [name], MD   40 mg at 12/05/11 1702     Orders Placed This Encounter  Procedures  . DG Chest 2 View  . Ambulatory referral to Pulmonology  No Follow-up on file.

## 2011-12-05 NOTE — Assessment & Plan Note (Addendum)
Secondary to deconditioning and COPD.  sats drop to 92 briefly and reboud to 94, on room air with ambulation.  Will start a prednisone taper.   Recommending cardiopulmonary rehab.

## 2011-12-05 NOTE — Telephone Encounter (Signed)
Patient is really weak ,coughing, sick on the stomach ,and dizzy . Daughter wants patient seen today.

## 2011-12-05 NOTE — Telephone Encounter (Signed)
Patient has an appt with Dr. Gilford Rile today.

## 2011-12-05 NOTE — Telephone Encounter (Signed)
Needing a prescription for prednisone called into the pharmacy.

## 2011-12-05 NOTE — Assessment & Plan Note (Signed)
Secondary to prior tobacco abuse.  Resume Advair,  sprivia initiated with samples,  Adding albuterol for prn use.  Referral to Dr. Lake Bells for PFTS/formal evaluation

## 2011-12-05 NOTE — Patient Instructions (Signed)
I am treating your for  The COPD with a prednisone taper (start pills tomorrow)  Resume your Advair one puff twice daily .  Continue  Spiriva one tablet nebulized in grey  egg daily    Use albuterol  (proventil, ventolin) 2 puffs every 6 hours if needed for chest tightness or wheezing

## 2011-12-05 NOTE — Telephone Encounter (Signed)
Please call in.  ASAP.  Chart updated.  Chest x ray no pneumonia,  But there were chronic emphysema changes.

## 2011-12-06 NOTE — Telephone Encounter (Signed)
This has been phoned in.  

## 2011-12-08 ENCOUNTER — Encounter: Payer: Self-pay | Admitting: Pulmonary Disease

## 2011-12-08 ENCOUNTER — Ambulatory Visit (INDEPENDENT_AMBULATORY_CARE_PROVIDER_SITE_OTHER): Payer: Medicare Other | Admitting: Pulmonary Disease

## 2011-12-08 VITALS — BP 110/62 | HR 68 | Temp 98.1°F | Ht 63.0 in | Wt 127.0 lb

## 2011-12-08 DIAGNOSIS — R0602 Shortness of breath: Secondary | ICD-10-CM | POA: Diagnosis not present

## 2011-12-08 DIAGNOSIS — J4489 Other specified chronic obstructive pulmonary disease: Secondary | ICD-10-CM

## 2011-12-08 DIAGNOSIS — J449 Chronic obstructive pulmonary disease, unspecified: Secondary | ICD-10-CM | POA: Diagnosis not present

## 2011-12-08 NOTE — Progress Notes (Signed)
Subjective:    Patient ID: [name], female    DOB: 07-05-1926, 78 y.o.   MRN: [id number]  HPI This is a very pleasant female with COPD who presented to our office on 12/08/2011 for an initial evaluation of the same.  She states that she was diagnosed with COPD several years ago by Dr. Wende Neighbors here in [place].  She was placed on inhalers (Spiriva, can't remember the name of the other) but she discontinued these after several years because she found that they didn't seem to make any difference.  Since discontinuing the medications she has noted no symptoms of shortness of breath or cough.  She states that she exercises daily in her home and walks 15 minutes a day.  She plays golf frequently and usually walks 18 holes.   She is referred to [place] because recently she developed a prolonged case of bronchitis.  She was seen by her PCP, Dr. Derrel Nip who initially treated her with azithromycin and Advair/Spiriva.  When the cough did not improve she was given a course of prednisone.  She states that she now feels much better.  During the illness she doesn't recall feeling short of breath, she was just bothered by the cough and clear sputum production.   Past Medical History  Diagnosis Date  . Carotid artery disease     Doppler January, 2012, 40-59% bilateral, followup one year  . Hyperlipidemia   . Hypertension   . SOB (shortness of breath)   . Dizziness     stable now (Oct 2011) patient tells me question of TIA, we will obtain records  . Syncopal episodes     after 18 holes of golf and increase hydrochlorothiazide, resolved   . Indigestion     3 weeks, October 2011  . CAD (coronary artery disease)     Nuclear October, 2011, not gated, no scar or ischemia  . Ejection fraction     EF normal, nuclear, 2008, no echo data as of April 19, 2011  . Rheumatic fever   . History of migraines   . Kidney stones   . GERD (gastroesophageal reflux disease)   . Diverticulitis     last colonoscopy   incomplete Jan 2011  . Hx of CABG     1999     Family History  Problem Relation Age of Onset  . Heart disease Mother   . Heart disease Father   . Cancer Neg Hx   . Drug abuse Neg Hx      History   Social History  . Marital Status: Widowed    Spouse Name: N/A    Number of Children: 2  . Years of Education: N/A   Occupational History  .     Social History Main Topics  . Smoking status: Former Smoker -- 0.5 packs/day for 40 years    Types: Cigarettes    Quit date: 09/20/1983  . Smokeless tobacco: Not on file  . Alcohol Use: Yes     yes, social wine  . Drug Use: No  . Sexually Active: Not on file   Other Topics Concern  . Not on file   Social History Narrative  . No narrative on file     Allergies  Allergen Reactions  . Codeine      Outpatient Prescriptions Prior to Visit  Medication Sig Dispense Refill  . albuterol (PROVENTIL HFA;VENTOLIN HFA) 108 (90 BASE) MCG/ACT inhaler Inhale 2 puffs into the lungs every 6 (six) hours as needed for  wheezing.  3.7 g  11  . ALPRAZolam (XANAX) 0.25 MG tablet Take one tablet twice a day as needed for sleep or anxiety.  30 tablet  3  . aspirin 81 MG tablet Take 81 mg by mouth daily.        . calcium-vitamin D (SM CALCIUM 500/VITAMIN D3) 500-400 MG-UNIT per tablet Take 1 tablet by mouth daily.        . chlorpheniramine-HYDROcodone (TUSSIONEX PENNKINETIC ER) 10-8 MG/5ML LQCR Take 5 mLs by mouth every 12 (twelve) hours as needed.  140 mL  0  . clopidogrel (PLAVIX) 75 MG tablet Take 75 mg by mouth daily.        . multivitamin (THERAGRAN) per tablet Take 1 tablet by mouth daily.        . pantoprazole (PROTONIX) 40 MG tablet Take 40 mg by mouth 2 (two) times daily as needed.       . predniSONE (STERAPRED UNI-PAK) 10 MG tablet 6 tablets on Day 1 , then reduce by 1 tablet daily until gone  21 tablet  0  . Psyllium-Calcium (METAMUCIL PLUS CALCIUM) CAPS Take 2 capsules by mouth daily.        . simvastatin (ZOCOR) 40 MG tablet Take 40 mg by  mouth at bedtime.        . valsartan-hydrochlorothiazide (DIOVAN HCT) 320-12.5 MG per tablet Take 1 tablet by mouth daily.  30 tablet  5  . amoxicillin-clavulanate (AUGMENTIN) 875-125 MG per tablet Take 1 tablet by mouth 2 (two) times daily.  14 tablet  0  . ondansetron (ZOFRAN) 4 MG tablet Take 1 tablet (4 mg total) by mouth every 8 (eight) hours as needed for nausea.  20 tablet  0     Review of Systems  Constitutional: Negative for fever, chills and unexpected weight change.  HENT: Negative for ear pain, nosebleeds, congestion, sore throat, rhinorrhea, sneezing, trouble swallowing, dental problem, voice change, postnasal drip and sinus pressure.   Eyes: Negative for visual disturbance.  Respiratory: Positive for cough and shortness of breath. Negative for choking.   Cardiovascular: Negative for chest pain and leg swelling.  Gastrointestinal: Negative for vomiting, abdominal pain and diarrhea.  Genitourinary: Negative for difficulty urinating.  Musculoskeletal: Negative for arthralgias.  Skin: Negative for rash.  Neurological: Negative for tremors, syncope and headaches.  Hematological: Does not bruise/bleed easily.       Objective:   Physical Exam  Filed Vitals:   12/08/11 1343  BP: 110/62  Pulse: 68  Temp: 98.1 F (36.7 C)  TempSrc: Oral  Height: 5\' 3"  (1.6 m)  Weight: 57.607 kg (127 lb)  SpO2: 95%   Gen: well appearing, no acute distress HEENT: NCAT, PERRL, EOMi, OP clear, neck supple without masses PULM: poor air movement, slight fine insp crackle left base, otherwise clear CV: RRR, slight systolic murmur at base, no JVD AB: BS+, soft, nontender, no hsm Ext: warm, no edema, no clubbing, no cyanosis Derm: no rash or skin breakdown Neuro: A&Ox4, CN II-XII intact, strength 5/5 in all 4 extremities  Review of 11/2011 CXR PA/Lat (my read): flattened diaphragms, hyperinflated lungs, emphysema noted, no infiltrate or acute pulmonary process  12/08/11 Simple Spirometry: Not  an acceptable test due to lots of coughing, but she appears to have obstruction based on the flow volume loop; FEV1 appears to be ~60% predicted      Assessment & Plan:   COPD (chronic obstructive pulmonary disease) COPD: GOLD Stage II, mMRC score 0-1 Combined recommendations from the [employer], [employer], [place], [place] (Qaseem A et al, Ann Intern Med. 2011;155(3):179) recommends tobacco cessation, pulmonary rehab (for symptomatic patients with an FEV1 < 50% predicted), supplemental oxygen (for patients with SaO2 <88% or paO2 <55), and appropriate bronchodilator therapy.  In regards to long acting bronchodilators, they recommend monotherapy (FEV1 60-80% with symptoms weak evidence, FEV1 with symptoms <60% strong evidence), or combination therapy (FEV1 <60% with symptoms, strong recommendation, moderate evidence).  One should also provide patients with annual immunizations and consider therapy for prevention of COPD exacerbations (ie. roflumilast or azithromycin) when appopriate.  -O2 therapy: not indicated -Immunizations: UTD -Tobacco use: quit 1985 -Exercise: encouraged to exercise more; I think that she minimized her shortness of breath;  I have encouraged her to make it a goal to exercise 35 minutes daily -Bronchodilator therapy: Based on PFT's will stop advair, continue tiotropium -Exacerbation prevention: n/a      Updated Medication List Outpatient Encounter Prescriptions as of 12/08/2011  Medication Sig Dispense Refill  . albuterol (PROVENTIL HFA;VENTOLIN HFA) 108 (90 BASE) MCG/ACT inhaler Inhale 2 puffs into the lungs every 6 (six) hours as needed for wheezing.  3.7 g  11  . ALPRAZolam (XANAX) 0.25 MG tablet Take one tablet twice a day as needed for sleep or anxiety.  30 tablet  3  . aspirin 81 MG tablet Take 81 mg by mouth daily.        . calcium-vitamin D (SM CALCIUM 500/VITAMIN D3) 500-400  MG-UNIT per tablet Take 1 tablet by mouth daily.        . chlorpheniramine-HYDROcodone (TUSSIONEX PENNKINETIC ER) 10-8 MG/5ML LQCR Take 5 mLs by mouth every 12 (twelve) hours as needed.  140 mL  0  . clopidogrel (PLAVIX) 75 MG tablet Take 75 mg by mouth daily.        . Fluticasone-Salmeterol (ADVAIR DISKUS) 250-50 MCG/DOSE AEPB Inhale 1 puff into the lungs every 12 (twelve) hours.      . multivitamin (THERAGRAN) per tablet Take 1 tablet by mouth daily.        . pantoprazole (PROTONIX) 40 MG tablet Take 40 mg by mouth 2 (two) times daily as needed.       . predniSONE (STERAPRED UNI-PAK) 10 MG tablet 6 tablets on Day 1 , then reduce by 1 tablet daily until gone  21 tablet  0  . Psyllium-Calcium (METAMUCIL PLUS CALCIUM) CAPS Take 2 capsules by mouth daily.        . simvastatin (ZOCOR) 40 MG tablet Take 40 mg by mouth at bedtime.        Marland Kitchen tiotropium (SPIRIVA) 18 MCG inhalation capsule Place 18 mcg into inhaler and inhale daily.      . valsartan-hydrochlorothiazide (DIOVAN HCT) 320-12.5 MG per tablet Take 1 tablet by mouth daily.  30 tablet  5  . DISCONTD: amoxicillin-clavulanate (AUGMENTIN) 875-125 MG per tablet Take 1 tablet by mouth 2 (two) times daily.  14 tablet  0  . DISCONTD: ondansetron (ZOFRAN) 4 MG tablet Take 1 tablet (4 mg total) by mouth every 8 (eight) hours as needed for nausea.  20 tablet  0

## 2011-12-08 NOTE — Assessment & Plan Note (Addendum)
COPD: GOLD Stage II, mMRC score 0-1 Combined recommendations from the [place], [employer], Investment banker, corporate, [place] (Qaseem A et al, Ann Intern Med. 2011;155(3):179) recommends tobacco cessation, pulmonary rehab (for symptomatic patients with an FEV1 < 50% predicted), supplemental oxygen (for patients with SaO2 <88% or paO2 <55), and appropriate bronchodilator therapy.  In regards to long acting bronchodilators, they recommend monotherapy (FEV1 60-80% with symptoms weak evidence, FEV1 with symptoms <60% strong evidence), or combination therapy (FEV1 <60% with symptoms, strong recommendation, moderate evidence).  One should also provide patients with annual immunizations and consider therapy for prevention of COPD exacerbations (ie. roflumilast or azithromycin) when appopriate.  -O2 therapy: not indicated -Immunizations: UTD -Tobacco use: quit 1985 -Exercise: encouraged to exercise more; I think that she minimized her shortness of breath;  I have encouraged her to make it a goal to exercise 35 minutes daily -Bronchodilator therapy: Based on PFT's will stop advair, continue tiotropium -Exacerbation prevention: n/a

## 2011-12-08 NOTE — Assessment & Plan Note (Deleted)
COPD: GOLD Stage ; mMRC score 1  Combined recommendations from the [place], [employer], Investment banker, corporate, [place] (Qaseem A et al, Ann Intern Med. 2011;155(3):179) recommends tobacco cessation, pulmonary rehab (for symptomatic patients with an FEV1 < 50% predicted), supplemental oxygen (for patients with SaO2 <88% or paO2 <55), and appropriate bronchodilator therapy.  In regards to long acting bronchodilators, they recommend monotherapy (FEV1 60-80% with symptoms weak evidence, FEV1 with symptoms <60% strong evidence), or combination therapy (FEV1 <60% with symptoms, strong recommendation, moderate evidence).  One should also provide patients with annual immunizations and consider therapy for prevention of COPD exacerbations (ie. roflumilast or azithromycin) when appopriate.  -O2 therapy: not indicated -Immunizations: UTD -Tobacco use: Quit 1985 -Exercise: She reports minimal difficulty with dyspnea on exertion and states that she exercises regularly, however I wonder if she is minimizing her symptoms;  I have encouraged her to walk up to 35 minutes daily. If she is not able to do this or becomes more symptomatic then we will refer her to pulmonary rehab -Bronchodilator therapy: I have advised her to stop the Advair and continue the Spiriva -Exacerbation prevention: ___

## 2011-12-08 NOTE — Patient Instructions (Signed)
Stop taking Advair. Continue Spiriva as you are doing. Try to increase your exercise to 35 minutes daily.  We will see you back in 3 months.

## 2011-12-14 DIAGNOSIS — L570 Actinic keratosis: Secondary | ICD-10-CM | POA: Diagnosis not present

## 2011-12-14 DIAGNOSIS — Z85828 Personal history of other malignant neoplasm of skin: Secondary | ICD-10-CM | POA: Diagnosis not present

## 2011-12-19 ENCOUNTER — Ambulatory Visit: Payer: Medicare Other | Admitting: Internal Medicine

## 2011-12-26 ENCOUNTER — Encounter: Payer: Self-pay | Admitting: Internal Medicine

## 2011-12-26 ENCOUNTER — Ambulatory Visit (INDEPENDENT_AMBULATORY_CARE_PROVIDER_SITE_OTHER): Payer: Medicare Other | Admitting: Internal Medicine

## 2011-12-26 VITALS — BP 124/68 | HR 64 | Temp 97.9°F | Resp 14 | Wt 127.0 lb

## 2011-12-26 DIAGNOSIS — K2950 Unspecified chronic gastritis without bleeding: Secondary | ICD-10-CM

## 2011-12-26 DIAGNOSIS — R1013 Epigastric pain: Secondary | ICD-10-CM | POA: Diagnosis not present

## 2011-12-26 DIAGNOSIS — K3189 Other diseases of stomach and duodenum: Secondary | ICD-10-CM | POA: Diagnosis not present

## 2011-12-26 DIAGNOSIS — N182 Chronic kidney disease, stage 2 (mild): Secondary | ICD-10-CM | POA: Diagnosis not present

## 2011-12-26 DIAGNOSIS — R634 Abnormal weight loss: Secondary | ICD-10-CM | POA: Insufficient documentation

## 2011-12-26 DIAGNOSIS — K2940 Chronic atrophic gastritis without bleeding: Secondary | ICD-10-CM | POA: Diagnosis not present

## 2011-12-26 DIAGNOSIS — J4489 Other specified chronic obstructive pulmonary disease: Secondary | ICD-10-CM

## 2011-12-26 DIAGNOSIS — J449 Chronic obstructive pulmonary disease, unspecified: Secondary | ICD-10-CM

## 2011-12-26 DIAGNOSIS — K30 Functional dyspepsia: Secondary | ICD-10-CM

## 2011-12-26 DIAGNOSIS — R42 Dizziness and giddiness: Secondary | ICD-10-CM

## 2011-12-26 DIAGNOSIS — R079 Chest pain, unspecified: Secondary | ICD-10-CM | POA: Insufficient documentation

## 2011-12-26 LAB — COMPLETE METABOLIC PANEL WITH GFR
AST: 23 U/L (ref 0–37)
Alkaline Phosphatase: 62 U/L (ref 39–117)
BUN: 27 mg/dL — ABNORMAL HIGH (ref 6–23)
GFR, Est Non African American: 35 mL/min — ABNORMAL LOW
Glucose, Bld: 105 mg/dL — ABNORMAL HIGH (ref 70–99)
Potassium: 4.4 mEq/L (ref 3.5–5.3)
Sodium: 142 mEq/L (ref 135–145)
Total Bilirubin: 0.6 mg/dL (ref 0.3–1.2)

## 2011-12-26 MED ORDER — ESOMEPRAZOLE MAGNESIUM 40 MG PO CPDR: 40.0000 mg | DELAYED_RELEASE_CAPSULE | Freq: Every day | ORAL | Status: DC

## 2011-12-26 NOTE — Assessment & Plan Note (Signed)
Now managed with Spiriva only. Per Dr. Luane School eval.  Lung exam is clear today and she denies dyspnea

## 2011-12-26 NOTE — Assessment & Plan Note (Signed)
Her 6 lb wt loss in one month is accompanied by anorexia.  Boost/Ensuyre daily supplement recommended and return in one month for eval.  If persistent. Will need to rule out occult malignancy.

## 2011-12-26 NOTE — Assessment & Plan Note (Signed)
Resolved currently

## 2011-12-26 NOTE — Assessment & Plan Note (Signed)
Secondary to GERd, with history of esophageal stricture ,  Symptoms were better managed with nexium,  Have been problematic since witch to generic protonix.

## 2011-12-26 NOTE — Assessment & Plan Note (Signed)
chronci with history of esophageal stricture dilated on prior EGD.  No dysphagia currently but increased anorexia and gastritis.  Will petition insurance to resume Nexium. ,  If weight loss continues she will need EGD.

## 2011-12-26 NOTE — Assessment & Plan Note (Signed)
Cr 1.6 by july 2012 [facility] records. No prior nephrology evaluation .

## 2011-12-26 NOTE — Progress Notes (Signed)
Patient ID: [name], female   DOB: [birth year], 78 y.o.   MRN: [id number]   Patient Active Problem List  Diagnoses  . Hyperlipidemia  . Hypertension  . SOB (shortness of breath)  . Dizziness  . Syncopal episodes  . Indigestion  . CAD (coronary artery disease)  . Ejection fraction  . Carotid artery disease  . Insomnia, persistent  . Chronic kidney disease (CKD), stage II (mild)  . Hx of CABG  . Diverticulitis  . COPD (chronic obstructive pulmonary disease)  . Gastritis, chronic  . Weight loss, non-intentional    Subjective:  CC:   Chief Complaint  Patient presents with  . Follow-up    HPI:   Katrina Minnig McPhersonis a 78 y.o. female who presents Follow up on COPD and other chronic health issues.  Her respiratory symptoms have resolved and she is walking daily but continues to report fatigue.  Denies dyspnea and chest pain.  Could not finish recent game of golf .  Has lost 6 lbs in the last month due to loss of appetite.  Has history of gastritis and GERD contgrolled with Nexium, but her insurance manadated a change to generic protonix and she has had increased gastritis since making the switch.    Past Medical History  Diagnosis Date  . Carotid artery disease     Doppler January, 2012, 40-59% bilateral, followup one year  . Hyperlipidemia   . Hypertension   . SOB (shortness of breath)   . Dizziness     stable now (Oct 2011) patient tells me question of TIA, we will obtain records  . Syncopal episodes     after 18 holes of golf and increase hydrochlorothiazide, resolved   . Indigestion     3 weeks, October 2011  . CAD (coronary artery disease)     Nuclear October, 2011, not gated, no scar or ischemia  . Ejection fraction     EF normal, nuclear, 2008, no echo data as of April 19, 2011  . Rheumatic fever   . History of migraines   . Kidney stones   . GERD (gastroesophageal reflux disease)   . Diverticulitis     last colonoscopy  incomplete Jan 2011  . Hx of  CABG     1999    Past Surgical History  Procedure Date  . Breast biopsy 1970  . Esophagus stretched   . Appendectomy 1950  . Coronary artery bypass graft 1999  . Tonsillectomy 1939  . Total abdominal hysterectomy 1968    due to metrorrhagia         The following portions of the patient's history were reviewed and updated as appropriate: Allergies, current medications, and problem list.    Review of Systems:   12 Pt  review of systems was negative except those addressed in the HPI,     History   Social History  . Marital Status: Widowed    Spouse Name: N/A    Number of Children: 2  . Years of Education: N/A   Occupational History  .     Social History Main Topics  . Smoking status: Former Smoker -- 0.5 packs/day for 40 years    Types: Cigarettes    Quit date: 09/20/1983  . Smokeless tobacco: Not on file  . Alcohol Use: Yes     yes, social wine  . Drug Use: No  . Sexually Active: Not on file   Other Topics Concern  . Not on file   Social History  Narrative  . No narrative on file    Objective:  BP 124/68  Pulse 64  Temp(Src) 97.9 F (36.6 C) (Oral)  Resp 14  Wt 127 lb (57.607 kg)  SpO2 98%  General appearance: alert, cooperative and appears stated age Ears: normal TM's and external ear canals both ears Throat: lips, mucosa, and tongue normal; teeth and gums normal Neck: no adenopathy, no carotid bruit, supple, symmetrical, trachea midline and thyroid not enlarged, symmetric, no tenderness/mass/nodules Back: symmetric, no curvature. ROM normal. No CVA tenderness. Lungs: clear to auscultation bilaterally Heart: regular rate and rhythm, S1, S2 normal, no murmur, click, rub or gallop Abdomen: soft, non-tender; bowel sounds normal; no masses,  no organomegaly Pulses: 2+ and symmetric Skin: Skin color, texture, turgor normal. No rashes or lesions Lymph nodes: Cervical, supraclavicular, and axillary nodes normal.  Assessment and  Plan:  Indigestion Secondary to GERd, with history of esophageal stricture ,  Symptoms were better managed with nexium,  Have been problematic since witch to generic protonix.    Gastritis, chronic chronci with history of esophageal stricture dilated on prior EGD.  No dysphagia currently but increased anorexia and gastritis.  Will petition insurance to resume Nexium. ,  If weight loss continues she will need EGD.   Chronic kidney disease (CKD), stage II (mild) Cr 1.6 by july 2012 [facility] records. No prior nephrology evaluation .   Dizziness Resolved currently  COPD (chronic obstructive pulmonary disease) Now managed with Spiriva only. Per Dr. Luane School eval.  Lung exam is clear today and she denies dyspnea   Weight loss, non-intentional Her 6 lb wt loss in one month is accompanied by anorexia.  Boost/Ensuyre daily supplement recommended and return in one month for eval.  If persistent. Will need to rule out occult malignancy.     Updated Medication List Outpatient Encounter Prescriptions as of 12/26/2011  Medication Sig Dispense Refill  . ALPRAZolam (XANAX) 0.25 MG tablet Take one tablet twice a day as needed for sleep or anxiety.  30 tablet  3  . aspirin 81 MG tablet Take 81 mg by mouth daily.        . calcium-vitamin D (SM CALCIUM 500/VITAMIN D3) 500-400 MG-UNIT per tablet Take 1 tablet by mouth daily.        . clopidogrel (PLAVIX) 75 MG tablet Take 75 mg by mouth daily.        . multivitamin (THERAGRAN) per tablet Take 1 tablet by mouth daily.        . Psyllium-Calcium (METAMUCIL PLUS CALCIUM) CAPS Take 2 capsules by mouth daily.        . simvastatin (ZOCOR) 40 MG tablet Take 40 mg by mouth at bedtime.        Marland Kitchen tiotropium (SPIRIVA) 18 MCG inhalation capsule Place 18 mcg into inhaler and inhale daily.      . valsartan-hydrochlorothiazide (DIOVAN HCT) 320-12.5 MG per tablet Take 1 tablet by mouth daily.  30 tablet  5  . DISCONTD: pantoprazole (PROTONIX) 40 MG tablet Take 40 mg by  mouth 2 (two) times daily as needed.       Marland Kitchen esomeprazole (NEXIUM) 40 MG capsule Take 1 capsule (40 mg total) by mouth daily before breakfast.  30 capsule  11  . DISCONTD: albuterol (PROVENTIL HFA;VENTOLIN HFA) 108 (90 BASE) MCG/ACT inhaler Inhale 2 puffs into the lungs every 6 (six) hours as needed for wheezing.  3.7 g  11  . DISCONTD: chlorpheniramine-HYDROcodone (TUSSIONEX PENNKINETIC ER) 10-8 MG/5ML LQCR Take 5 mLs by mouth every  12 (twelve) hours as needed.  140 mL  0  . DISCONTD: Fluticasone-Salmeterol (ADVAIR DISKUS) 250-50 MCG/DOSE AEPB Inhale 1 puff into the lungs every 12 (twelve) hours.         Orders Placed This Encounter  Procedures  . COMPLETE METABOLIC PANEL WITH GFR  . Microalbumin / creatinine urine ratio    No Follow-up on file.

## 2011-12-26 NOTE — Patient Instructions (Signed)
I recommend a liquid supplement .  Boost ,  Or Ensure,  Or [employer] Breakfast Drink (mix with mild or almond milk) to take when you are not hungry because you have lost 6 lbs this month.  Return in one montr for repeat evaluation ( I want to make sure you have regained some weight)

## 2011-12-27 LAB — MICROALBUMIN / CREATININE URINE RATIO
Microalb Creat Ratio: 3.8 mg/g (ref 0.0–30.0)
Microalb, Ur: 2.5 mg/dL — ABNORMAL HIGH (ref 0.0–1.9)

## 2011-12-30 DIAGNOSIS — H43819 Vitreous degeneration, unspecified eye: Secondary | ICD-10-CM | POA: Diagnosis not present

## 2012-01-04 ENCOUNTER — Telehealth: Payer: Self-pay | Admitting: Internal Medicine

## 2012-01-04 DIAGNOSIS — K2970 Gastritis, unspecified, without bleeding: Secondary | ICD-10-CM

## 2012-01-04 NOTE — Telephone Encounter (Signed)
Received a prior authorization for patients Nexium, prior auth form has been filled waiting for signature in red folder.

## 2012-01-11 NOTE — Telephone Encounter (Signed)
Nexium has been denied by the insurance company.

## 2012-01-11 NOTE — Telephone Encounter (Signed)
Did they have an alternative or say why/.  Give patient sampels. Of dexilatn if we have it,  And tell her we are referring to [facility] clinic for persistent gastritis

## 2012-01-12 NOTE — Telephone Encounter (Signed)
Patient returned call and was notified. She will come by and pick up dexilant samples.Samples left up front.

## 2012-01-12 NOTE — Telephone Encounter (Signed)
Left message asking patient to return my call.

## 2012-01-17 ENCOUNTER — Telehealth: Payer: Self-pay | Admitting: Internal Medicine

## 2012-01-17 NOTE — Telephone Encounter (Signed)
error 

## 2012-01-23 ENCOUNTER — Telehealth: Payer: Self-pay | Admitting: Internal Medicine

## 2012-01-23 DIAGNOSIS — I10 Essential (primary) hypertension: Secondary | ICD-10-CM

## 2012-01-23 NOTE — Telephone Encounter (Signed)
[id number] Pt stated she  came in last week and gave [place] the express insurance card and was to get refill on  All her meds refills need to be sent to [facility]  Pt is almost out of meds Please advise pt when these are called in She needs to order plavax diovan

## 2012-01-24 MED ORDER — ALPRAZOLAM 0.25 MG PO TABS: ORAL_TABLET | ORAL | Status: DC

## 2012-01-24 MED ORDER — ESOMEPRAZOLE MAGNESIUM 40 MG PO CPDR: 40.0000 mg | DELAYED_RELEASE_CAPSULE | Freq: Every day | ORAL | Status: DC

## 2012-01-24 MED ORDER — VALSARTAN-HYDROCHLOROTHIAZIDE 320-12.5 MG PO TABS: 1.0000 | ORAL_TABLET | Freq: Every day | ORAL | Status: DC

## 2012-01-24 MED ORDER — CLOPIDOGREL BISULFATE 75 MG PO TABS: 75.0000 mg | ORAL_TABLET | Freq: Every day | ORAL | Status: DC

## 2012-01-24 MED ORDER — SIMVASTATIN 40 MG PO TABS: 40.0000 mg | ORAL_TABLET | Freq: Every day | ORAL | Status: DC

## 2012-01-24 MED ORDER — TIOTROPIUM BROMIDE MONOHYDRATE 18 MCG IN CAPS: 18.0000 ug | ORAL_CAPSULE | Freq: Every day | RESPIRATORY_TRACT | Status: DC

## 2012-01-24 NOTE — Telephone Encounter (Signed)
Rxs have been sent in, patient notified.

## 2012-01-26 ENCOUNTER — Ambulatory Visit (INDEPENDENT_AMBULATORY_CARE_PROVIDER_SITE_OTHER): Payer: Medicare Other | Admitting: Internal Medicine

## 2012-01-26 ENCOUNTER — Encounter: Payer: Self-pay | Admitting: Internal Medicine

## 2012-01-26 VITALS — BP 118/68 | HR 65 | Temp 98.3°F | Resp 14 | Wt 130.0 lb

## 2012-01-26 DIAGNOSIS — R634 Abnormal weight loss: Secondary | ICD-10-CM | POA: Diagnosis not present

## 2012-01-26 DIAGNOSIS — J449 Chronic obstructive pulmonary disease, unspecified: Secondary | ICD-10-CM

## 2012-01-26 MED ORDER — ESOMEPRAZOLE MAGNESIUM 40 MG PO CPDR: 40.0000 mg | DELAYED_RELEASE_CAPSULE | Freq: Every day | ORAL | Status: DC

## 2012-01-26 MED ORDER — TIOTROPIUM BROMIDE MONOHYDRATE 18 MCG IN CAPS: 18.0000 ug | ORAL_CAPSULE | Freq: Every day | RESPIRATORY_TRACT | Status: DC

## 2012-01-29 NOTE — Progress Notes (Signed)
Patient ID: [name], female   DOB: [birth year], 78 y.o.   MRN: [id number]  Patient Active Problem List  Diagnoses  . Hyperlipidemia  . Hypertension  . SOB (shortness of breath)  . Dizziness  . Syncopal episodes  . Indigestion  . CAD (coronary artery disease)  . Ejection fraction  . Carotid artery disease  . Insomnia, persistent  . Chronic kidney disease (CKD), stage II (mild)  . Hx of CABG  . Diverticulitis  . COPD (chronic obstructive pulmonary disease)  . Gastritis, chronic  . Weight loss, non-intentional    Subjective:  CC:   Chief Complaint  Patient presents with  . Follow-up    HPI:   Katrina Topf McPhersonis a 78 y.o. female who presents Six-week followup for prior symptoms of malaise fatigue nausea and shortness of breath following a COPD exacerbation. She looks well today states that she feels absolutely great. All the symptoms have resolved. She is back to playing golf several times a week  and able to walk the course without excessive fatigue or dyspnea.   Past Medical History  Diagnosis Date  . Carotid artery disease     Doppler January, 2012, 40-59% bilateral, followup one year  . Hyperlipidemia   . Hypertension   . SOB (shortness of breath)   . Dizziness     stable now (Oct 2011) patient tells me question of TIA, we will obtain records  . Syncopal episodes     after 18 holes of golf and increase hydrochlorothiazide, resolved   . Indigestion     3 weeks, October 2011  . CAD (coronary artery disease)     Nuclear October, 2011, not gated, no scar or ischemia  . Ejection fraction     EF normal, nuclear, 2008, no echo data as of April 19, 2011  . Rheumatic fever   . History of migraines   . Kidney stones   . GERD (gastroesophageal reflux disease)   . Diverticulitis     last colonoscopy  incomplete Jan 2011  . Hx of CABG     1999    Past Surgical History  Procedure Date  . Breast biopsy 1970  . Esophagus stretched   . Appendectomy 1950  .  Coronary artery bypass graft 1999  . Tonsillectomy 1939  . Total abdominal hysterectomy 1968    due to metrorrhagia         The following portions of the patient's history were reviewed and updated as appropriate: Allergies, current medications, and problem list.    Review of Systems:   12 Pt  review of systems was negative except those addressed in the HPI,     History   Social History  . Marital Status: Widowed    Spouse Name: N/A    Number of Children: 2  . Years of Education: N/A   Occupational History  .     Social History Main Topics  . Smoking status: Former Smoker -- 0.5 packs/day for 40 years    Types: Cigarettes    Quit date: 09/20/1983  . Smokeless tobacco: Never Used  . Alcohol Use: Yes     yes, social wine  . Drug Use: No  . Sexually Active: Not on file   Other Topics Concern  . Not on file   Social History Narrative  . No narrative on file    Objective:  BP 118/68  Pulse 65  Temp(Src) 98.3 F (36.8 C) (Oral)  Resp 14  Wt 130 lb (58.968  kg)  SpO2 99%  General appearance: alert, cooperative and appears stated age Ears: normal TM's and external ear canals both ears Throat: lips, mucosa, and tongue normal; teeth and gums normal Neck: no adenopathy, no carotid bruit, supple, symmetrical, trachea midline and thyroid not enlarged, symmetric, no tenderness/mass/nodules Back: symmetric, no curvature. ROM normal. No CVA tenderness. Lungs: clear to auscultation bilaterally Heart: regular rate and rhythm, S1, S2 normal, no murmur, click, rub or gallop Abdomen: soft, non-tender; bowel sounds normal; no masses,  no organomegaly Pulses: 2+ and symmetric Skin: Skin color, texture, turgor normal. No rashes or lesions Lymph nodes: Cervical, supraclavicular, and axillary nodes normal.  Assessment and Plan:  Weight loss, non-intentional Result with improved appetite and resolution of gastritis.  COPD (chronic obstructive pulmonary disease) With  recent exacerbation now resolved. She is back to baseline. She is able to walk the golf course without excessive shortness of breath. Lung exam is clear today.    Updated Medication List Outpatient Encounter Prescriptions as of 01/26/2012  Medication Sig Dispense Refill  . ALPRAZolam (XANAX) 0.25 MG tablet Take one tablet twice a day as needed for sleep or anxiety.  90 tablet  1  . aspirin 81 MG tablet Take 81 mg by mouth daily.        . calcium-vitamin D (SM CALCIUM 500/VITAMIN D3) 500-400 MG-UNIT per tablet Take 1 tablet by mouth daily.        . clopidogrel (PLAVIX) 75 MG tablet Take 1 tablet (75 mg total) by mouth daily.  90 tablet  3  . esomeprazole (NEXIUM) 40 MG capsule Take 1 capsule (40 mg total) by mouth daily before breakfast.  90 capsule  3  . multivitamin (THERAGRAN) per tablet Take 1 tablet by mouth daily.        . Psyllium-Calcium (METAMUCIL PLUS CALCIUM) CAPS Take 2 capsules by mouth daily.        . simvastatin (ZOCOR) 40 MG tablet Take 1 tablet (40 mg total) by mouth at bedtime.  90 tablet  3  . tiotropium (SPIRIVA) 18 MCG inhalation capsule Place 1 capsule (18 mcg total) into inhaler and inhale daily.  90 capsule  3  . valsartan-hydrochlorothiazide (DIOVAN HCT) 320-12.5 MG per tablet Take 1 tablet by mouth daily.  90 tablet  3  . DISCONTD: esomeprazole (NEXIUM) 40 MG capsule Take 1 capsule (40 mg total) by mouth daily before breakfast.  90 capsule  3  . DISCONTD: tiotropium (SPIRIVA) 18 MCG inhalation capsule Place 1 capsule (18 mcg total) into inhaler and inhale daily.  90 capsule  3     No orders of the defined types were placed in this encounter.    No Follow-up on file.

## 2012-01-29 NOTE — Assessment & Plan Note (Signed)
With recent exacerbation now resolved. She is back to baseline. She is able to walk the golf course without excessive shortness of breath. Lung exam is clear today.

## 2012-01-29 NOTE — Assessment & Plan Note (Signed)
Result with improved appetite and resolution of gastritis.

## 2012-02-26 ENCOUNTER — Emergency Department: Payer: Self-pay | Admitting: Emergency Medicine

## 2012-02-26 DIAGNOSIS — I2510 Atherosclerotic heart disease of native coronary artery without angina pectoris: Secondary | ICD-10-CM | POA: Diagnosis not present

## 2012-02-26 DIAGNOSIS — I10 Essential (primary) hypertension: Secondary | ICD-10-CM | POA: Diagnosis not present

## 2012-02-26 DIAGNOSIS — S43499A Other sprain of unspecified shoulder joint, initial encounter: Secondary | ICD-10-CM | POA: Diagnosis not present

## 2012-02-26 DIAGNOSIS — Z79899 Other long term (current) drug therapy: Secondary | ICD-10-CM | POA: Diagnosis not present

## 2012-02-26 DIAGNOSIS — S46819A Strain of other muscles, fascia and tendons at shoulder and upper arm level, unspecified arm, initial encounter: Secondary | ICD-10-CM | POA: Diagnosis not present

## 2012-02-26 DIAGNOSIS — J449 Chronic obstructive pulmonary disease, unspecified: Secondary | ICD-10-CM | POA: Diagnosis not present

## 2012-02-26 DIAGNOSIS — M62838 Other muscle spasm: Secondary | ICD-10-CM | POA: Diagnosis not present

## 2012-02-26 DIAGNOSIS — Z87891 Personal history of nicotine dependence: Secondary | ICD-10-CM | POA: Diagnosis not present

## 2012-02-26 DIAGNOSIS — R079 Chest pain, unspecified: Secondary | ICD-10-CM | POA: Diagnosis not present

## 2012-02-26 DIAGNOSIS — Z951 Presence of aortocoronary bypass graft: Secondary | ICD-10-CM | POA: Diagnosis not present

## 2012-02-26 DIAGNOSIS — M25519 Pain in unspecified shoulder: Secondary | ICD-10-CM | POA: Diagnosis not present

## 2012-02-26 DIAGNOSIS — R9431 Abnormal electrocardiogram [ECG] [EKG]: Secondary | ICD-10-CM | POA: Diagnosis not present

## 2012-02-26 LAB — CBC
HGB: 12.4 g/dL (ref 12.0–16.0)
MCH: 34.6 pg — ABNORMAL HIGH (ref 26.0–34.0)
MCHC: 34.2 g/dL (ref 32.0–36.0)
MCV: 101 fL — ABNORMAL HIGH (ref 80–100)
Platelet: 164 10*3/uL (ref 150–440)
RBC: 3.59 10*6/uL — ABNORMAL LOW (ref 3.80–5.20)

## 2012-02-26 LAB — BASIC METABOLIC PANEL
BUN: 27 mg/dL — ABNORMAL HIGH (ref 7–18)
Calcium, Total: 8.3 mg/dL — ABNORMAL LOW (ref 8.5–10.1)
Creatinine: 1.5 mg/dL — ABNORMAL HIGH (ref 0.60–1.30)
EGFR (African American): 37 — ABNORMAL LOW
EGFR (Non-African Amer.): 32 — ABNORMAL LOW
Osmolality: 292 (ref 275–301)
Sodium: 143 mmol/L (ref 136–145)

## 2012-02-29 ENCOUNTER — Ambulatory Visit: Payer: Medicare Other | Admitting: Pulmonary Disease

## 2012-03-05 ENCOUNTER — Encounter: Payer: Self-pay | Admitting: Cardiology

## 2012-03-05 ENCOUNTER — Ambulatory Visit (INDEPENDENT_AMBULATORY_CARE_PROVIDER_SITE_OTHER): Payer: Medicare Other | Admitting: Cardiology

## 2012-03-05 VITALS — BP 132/70 | HR 68 | Resp 17 | Ht 63.0 in | Wt 131.4 lb

## 2012-03-05 DIAGNOSIS — I10 Essential (primary) hypertension: Secondary | ICD-10-CM

## 2012-03-05 DIAGNOSIS — I2510 Atherosclerotic heart disease of native coronary artery without angina pectoris: Secondary | ICD-10-CM

## 2012-03-05 DIAGNOSIS — I779 Disorder of arteries and arterioles, unspecified: Secondary | ICD-10-CM

## 2012-03-05 DIAGNOSIS — R0602 Shortness of breath: Secondary | ICD-10-CM | POA: Diagnosis not present

## 2012-03-05 NOTE — Assessment & Plan Note (Addendum)
Coronary disease is stable. I believe her recent symptoms were not ischemic in origin. Her last nuclear scan was 2011. I've chosen not to repeated at this time.  I have taken care of this patient for many many years. She is sad because her family is asking her to move her cardiology care to our office in [place]. She wants to see me again at the next visit. We did discuss the fact that her followup in [place] would be with Dr.Gollan, when she decides to make a change. I will send a copy of today's note to Dr.Gollan so that he is aware. I did explain to her that we use the same computer system and that all of her information will be available there in [place].

## 2012-03-05 NOTE — Patient Instructions (Addendum)
Your physician wants you to follow-up in:  6 months. You will receive a reminder letter in the mail two months in advance. If you don't receive a letter, please call our office to schedule the follow-up appointment.   

## 2012-03-05 NOTE — Progress Notes (Signed)
HPI   The patient is here for cardiology followup. She's feeling well today. Several days ago she was having some discomfort in her left neck and left chest. She was seen in the emergency room in [place]. It was felt that her cardiac status was stable and that her symptoms were musculoskeletal. She has seen her chiropractor since then. She's feeling better.  She is not having any significant exertional symptoms.    Allergies  Allergen Reactions  . Codeine     Current Outpatient Prescriptions  Medication Sig Dispense Refill  . ALPRAZolam (XANAX) 0.25 MG tablet Take one tablet twice a day as needed for sleep or anxiety.  90 tablet  1  . aspirin 81 MG tablet Take 81 mg by mouth daily.        . calcium-vitamin D (SM CALCIUM 500/VITAMIN D3) 500-400 MG-UNIT per tablet Take 1 tablet by mouth daily.        . clopidogrel (PLAVIX) 75 MG tablet Take 1 tablet (75 mg total) by mouth daily.  90 tablet  3  . esomeprazole (NEXIUM) 40 MG capsule Take 1 capsule (40 mg total) by mouth daily before breakfast.  90 capsule  3  . multivitamin (THERAGRAN) per tablet Take 1 tablet by mouth daily.        . Psyllium-Calcium (METAMUCIL PLUS CALCIUM) CAPS Take 2 capsules by mouth daily.        . simvastatin (ZOCOR) 40 MG tablet Take 1 tablet (40 mg total) by mouth at bedtime.  90 tablet  3  . tiotropium (SPIRIVA) 18 MCG inhalation capsule Place 1 capsule (18 mcg total) into inhaler and inhale daily.  90 capsule  3  . valsartan-hydrochlorothiazide (DIOVAN HCT) 320-12.5 MG per tablet Take 1 tablet by mouth daily.  90 tablet  3    History   Social History  . Marital Status: Widowed    Spouse Name: N/A    Number of Children: 2  . Years of Education: N/A   Occupational History  .     Social History Main Topics  . Smoking status: Former Smoker -- 0.5 packs/day for 40 years    Types: Cigarettes    Quit date: 09/20/1983  . Smokeless tobacco: Never Used  . Alcohol Use: Yes     yes, social wine  . Drug  Use: No  . Sexually Active: Not on file   Other Topics Concern  . Not on file   Social History Narrative  . No narrative on file    Family History  Problem Relation Age of Onset  . Heart disease Mother   . Heart disease Father   . Cancer Neg Hx   . Drug abuse Neg Hx     Past Medical History  Diagnosis Date  . Carotid artery disease     Doppler January, 2012, 40-59% bilateral, followup one year  . Hyperlipidemia   . Hypertension   . SOB (shortness of breath)   . Dizziness     stable now (Oct 2011) patient tells me question of TIA, we will obtain records  . Syncopal episodes     after 18 holes of golf and increase hydrochlorothiazide, resolved   . Indigestion     3 weeks, October 2011  . CAD (coronary artery disease)     Nuclear October, 2011, not gated, no scar or ischemia  . Ejection fraction     EF normal, nuclear, 2008, no echo data as of April 19, 2011  . Rheumatic fever   .  History of migraines   . Kidney stones   . GERD (gastroesophageal reflux disease)   . Diverticulitis     last colonoscopy  incomplete Jan 2011  . Hx of CABG     1999    Past Surgical History  Procedure Date  . Breast biopsy 1970  . Esophagus stretched   . Appendectomy 1950  . Coronary artery bypass graft 1999  . Tonsillectomy 1939  . Total abdominal hysterectomy 1968    due to metrorrhagia    ROS  Patient denies fever, chills, headache, sweats, rash, change in vision, change in hearing, chest pain, cough, nausea vomiting, urinary symptoms. All other systems are reviewed and are negative.  PHYSICAL EXAM   Patient's oriented to person time and place. Affect is normal. There is no jugulovenous distention. Lungs are clear. Respiratory effort is nonlabored. Cardiac exam reveals an S1 and S2. There no clicks or significant murmurs. The abdomen is soft. There is no peripheral edema.  Filed Vitals:   03/05/12 1009  BP: 132/70  Pulse: 68  Resp: 17  Height: 5\' 3"  (1.6 m)  Weight: 131 lb  6.4 oz (59.603 kg)   I have not done an EKG today. The patient had EKG at the hospital in [place].  ASSESSMENT & PLAN

## 2012-03-05 NOTE — Assessment & Plan Note (Signed)
She is not having any significant shortness of breath. No further workup.

## 2012-03-05 NOTE — Assessment & Plan Note (Signed)
Her carotid arteries are being followed. She will have a followup Doppler in November or December of 2013.

## 2012-03-05 NOTE — Assessment & Plan Note (Signed)
Blood pressures control. No change in therapy. 

## 2012-04-06 ENCOUNTER — Ambulatory Visit: Payer: Medicare Other | Admitting: Cardiology

## 2012-04-09 ENCOUNTER — Encounter: Payer: Self-pay | Admitting: Pulmonary Disease

## 2012-04-09 ENCOUNTER — Ambulatory Visit (INDEPENDENT_AMBULATORY_CARE_PROVIDER_SITE_OTHER): Payer: Medicare Other | Admitting: Pulmonary Disease

## 2012-04-09 VITALS — BP 132/66 | HR 60 | Temp 98.3°F | Ht 63.5 in | Wt 132.8 lb

## 2012-04-09 DIAGNOSIS — J4489 Other specified chronic obstructive pulmonary disease: Secondary | ICD-10-CM

## 2012-04-09 DIAGNOSIS — J449 Chronic obstructive pulmonary disease, unspecified: Secondary | ICD-10-CM

## 2012-04-09 NOTE — Patient Instructions (Signed)
Keep taking spiriva as you are doing Keep exercising daily as you are doing. Get a flu shot as soon as they are available this year Wash your hands when you go out in public We will see you back in January 2014 or sooner if needed

## 2012-04-09 NOTE — Progress Notes (Signed)
Subjective:    Patient ID: [name], female    DOB: July 16, 1926, 78 y.o.   MRN: [id number]  Synopsis: The a Oesterreich first came to the [facility] pulmonary office in March 2013 for evaluation of COPD. Spirometry at that time showed moderate obstruction. She smoked one half pack of cigarettes daily for 40 years and quit in 1985. Based on her symptoms and spirometry she was listed as gold grade A.  HPI  04/09/2012 ROV -- [name] says that she has been walking on her treadmill 35-40 minutes daily with an incline. She states that she is tolerating this well and doesn't have to stop for shortness of breath. She continues to work out and [employer] at home. She has not had to use her Ventolin and she does not have intermittent chest tightness or wheezing. Overall she is doing quite well. She continues to use the Spiriva and doesn't notice side effects from it.  Past Medical History  Diagnosis Date  . Carotid artery disease     Doppler January, 2012, 40-59% bilateral, followup one year  . Hyperlipidemia   . Hypertension   . SOB (shortness of breath)   . Dizziness     stable now (Oct 2011) patient tells me question of TIA, we will obtain records  . Syncopal episodes     after 18 holes of golf and increase hydrochlorothiazide, resolved   . Indigestion     3 weeks, October 2011  . CAD (coronary artery disease)     Nuclear October, 2011, not gated, no scar or ischemia  . Ejection fraction     EF normal, nuclear, 2008, no echo data as of April 19, 2011  . Rheumatic fever   . History of migraines   . Kidney stones   . GERD (gastroesophageal reflux disease)   . Diverticulitis     last colonoscopy  incomplete Jan 2011  . Hx of CABG     1999      Review of Systems  Constitutional: Negative for fever, chills and unexpected weight change.  HENT: Negative for congestion, rhinorrhea, sneezing, postnasal drip and sinus pressure.   Respiratory: Negative for cough, choking  and shortness of breath.   Cardiovascular: Negative for chest pain and leg swelling.       Objective:   Physical Exam   Filed Vitals:   04/09/12 1556  BP: 132/66  Pulse: 60  Temp: 98.3 F (36.8 C)  TempSrc: Oral  Height: 5' 3.5" (1.613 m)  Weight: 132 lb 12.8 oz (60.238 kg)  SpO2: 97%   Gen: well appearing, no acute distress HEENT: NCAT, PERRL, EOMi, OP clear, neck supple without masses PULM: poor air movement, slight fine insp crackle left base, otherwise clear CV: RRR, slight systolic murmur at base, no JVD AB: BS+, soft, nontender, no hsm Ext: warm, no edema, no clubbing, no cyanosis  Review of 11/2011 CXR PA/Lat (my read): flattened diaphragms, hyperinflated lungs, emphysema noted, no infiltrate or acute pulmonary process  12/08/11 Simple Spirometry: Not an acceptable test due to lots of coughing, but she appears to have obstruction based on the flow volume loop; FEV1 appears to be ~60% predicted      Assessment & Plan:   COPD (chronic obstructive pulmonary disease) This is been a very stable interval for [name] after stopping the Advair and continuing Spiriva. She is now exercising 40 minutes daily without significant shortness of breath. She has not had to use her rescue inhaler at all. We  focused most of our discussion today on preventing flares of COPD and bronchitis like she had last winter. I advised her that she needs to continue exercising regularly throughout the wintertime and she needs to get a flu shot as soon as they are available. I also told her that she needs to practice good hand hygiene and to let us or Dr. Derrel Nip know as soon as she develops a cold.    Updated Medication List Outpatient Encounter Prescriptions as of 04/09/2012  Medication Sig Dispense Refill  . ALPRAZolam (XANAX) 0.25 MG tablet Take one tablet twice a day as needed for sleep or anxiety.  90 tablet  1  . aspirin 81 MG tablet Take 81 mg by mouth daily.        . calcium-vitamin D  (SM CALCIUM 500/VITAMIN D3) 500-400 MG-UNIT per tablet Take 1 tablet by mouth daily.        . clopidogrel (PLAVIX) 75 MG tablet Take 1 tablet (75 mg total) by mouth daily.  90 tablet  3  . esomeprazole (NEXIUM) 40 MG capsule Take 1 capsule (40 mg total) by mouth daily before breakfast.  90 capsule  3  . multivitamin (THERAGRAN) per tablet Take 1 tablet by mouth daily.        . Psyllium-Calcium (METAMUCIL PLUS CALCIUM) CAPS Take 2 capsules by mouth daily as needed.       . simvastatin (ZOCOR) 40 MG tablet Take 1 tablet (40 mg total) by mouth at bedtime.  90 tablet  3  . tiotropium (SPIRIVA) 18 MCG inhalation capsule Place 1 capsule (18 mcg total) into inhaler and inhale daily.  90 capsule  3  . valsartan-hydrochlorothiazide (DIOVAN HCT) 320-12.5 MG per tablet Take 1 tablet by mouth daily.  90 tablet  3

## 2012-04-09 NOTE — Assessment & Plan Note (Signed)
This is been a very stable interval for [name] after stopping the Advair and continuing Spiriva. She is now exercising 40 minutes daily without significant shortness of breath. She has not had to use her rescue inhaler at all. We focused most of our discussion today on preventing flares of COPD and bronchitis like she had last winter. I advised her that she needs to continue exercising regularly throughout the wintertime and she needs to get a flu shot as soon as they are available. I also told her that she needs to practice good hand hygiene and to let us or Dr. Derrel Nip know as soon as she develops a cold.

## 2012-05-13 DIAGNOSIS — Z23 Encounter for immunization: Secondary | ICD-10-CM | POA: Diagnosis not present

## 2012-06-12 ENCOUNTER — Ambulatory Visit: Payer: Medicare Other | Admitting: Internal Medicine

## 2012-08-29 ENCOUNTER — Other Ambulatory Visit: Payer: Self-pay | Admitting: Cardiology

## 2012-08-29 DIAGNOSIS — I6529 Occlusion and stenosis of unspecified carotid artery: Secondary | ICD-10-CM

## 2012-09-03 ENCOUNTER — Ambulatory Visit (INDEPENDENT_AMBULATORY_CARE_PROVIDER_SITE_OTHER): Payer: Medicare Other | Admitting: Cardiology

## 2012-09-03 ENCOUNTER — Encounter: Payer: Self-pay | Admitting: Cardiology

## 2012-09-03 ENCOUNTER — Encounter (INDEPENDENT_AMBULATORY_CARE_PROVIDER_SITE_OTHER): Payer: Medicare Other

## 2012-09-03 VITALS — BP 150/84 | HR 60 | Ht 63.0 in | Wt 132.0 lb

## 2012-09-03 DIAGNOSIS — I6529 Occlusion and stenosis of unspecified carotid artery: Secondary | ICD-10-CM | POA: Diagnosis not present

## 2012-09-03 DIAGNOSIS — I498 Other specified cardiac arrhythmias: Secondary | ICD-10-CM

## 2012-09-03 DIAGNOSIS — R269 Unspecified abnormalities of gait and mobility: Secondary | ICD-10-CM | POA: Diagnosis not present

## 2012-09-03 DIAGNOSIS — I2510 Atherosclerotic heart disease of native coronary artery without angina pectoris: Secondary | ICD-10-CM | POA: Diagnosis not present

## 2012-09-03 DIAGNOSIS — R001 Bradycardia, unspecified: Secondary | ICD-10-CM | POA: Insufficient documentation

## 2012-09-03 DIAGNOSIS — I10 Essential (primary) hypertension: Secondary | ICD-10-CM | POA: Diagnosis not present

## 2012-09-03 DIAGNOSIS — I779 Disorder of arteries and arterioles, unspecified: Secondary | ICD-10-CM

## 2012-09-03 NOTE — Patient Instructions (Addendum)
Your physician wants you to follow-up in: 6 months with Dr Rockey Situ in [place].   You will receive a reminder letter in the mail two months in advance. If you don't receive a letter, please call our office to schedule the follow-up appointment.  Your carotid doppler is stable

## 2012-09-03 NOTE — Assessment & Plan Note (Signed)
Systolic blood pressure is mildly elevated. No change in therapy.

## 2012-09-03 NOTE — Progress Notes (Signed)
HPI  The patient is seen in followup coronary disease and carotid artery disease. She's not having any chest pain. She mentions that she feels like when walking she feels a little "wobbly ". She has not had syncope or presyncope. She has not fallen. This does not sound like vertigo.  Allergies  Allergen Reactions  . Codeine     Current Outpatient Prescriptions  Medication Sig Dispense Refill  . ALPRAZolam (XANAX) 0.25 MG tablet Take one tablet twice a day as needed for sleep or anxiety.  90 tablet  1  . aspirin 81 MG tablet Take 81 mg by mouth daily.        . calcium-vitamin D (SM CALCIUM 500/VITAMIN D3) 500-400 MG-UNIT per tablet Take 1 tablet by mouth daily.        . clopidogrel (PLAVIX) 75 MG tablet Take 1 tablet (75 mg total) by mouth daily.  90 tablet  3  . esomeprazole (NEXIUM) 40 MG capsule Take 1 capsule (40 mg total) by mouth daily before breakfast.  90 capsule  3  . multivitamin (THERAGRAN) per tablet Take 1 tablet by mouth daily.        . Psyllium-Calcium (METAMUCIL PLUS CALCIUM) CAPS Take 2 capsules by mouth daily as needed.       . simvastatin (ZOCOR) 40 MG tablet Take 1 tablet (40 mg total) by mouth at bedtime.  90 tablet  3  . tiotropium (SPIRIVA) 18 MCG inhalation capsule Place 1 capsule (18 mcg total) into inhaler and inhale daily.  90 capsule  3  . valsartan-hydrochlorothiazide (DIOVAN HCT) 320-12.5 MG per tablet Take 1 tablet by mouth daily.  90 tablet  3    History   Social History  . Marital Status: Widowed    Spouse Name: N/A    Number of Children: 2  . Years of Education: N/A   Occupational History  .     Social History Main Topics  . Smoking status: Former Smoker -- 0.5 packs/day for 40 years    Types: Cigarettes    Quit date: 09/20/1983  . Smokeless tobacco: Never Used  . Alcohol Use: Yes     Comment: yes, social wine  . Drug Use: No  . Sexually Active: Not on file   Other Topics Concern  . Not on file   Social History Narrative  . No  narrative on file    Family History  Problem Relation Age of Onset  . Heart disease Mother   . Heart disease Father   . Cancer Neg Hx   . Drug abuse Neg Hx     Past Medical History  Diagnosis Date  . Carotid artery disease     Doppler January, 2012, 40-59% bilateral, followup one year  . Hyperlipidemia   . Hypertension   . SOB (shortness of breath)   . Dizziness     stable now (Oct 2011) patient tells me question of TIA, we will obtain records  . Syncopal episodes     after 18 holes of golf and increase hydrochlorothiazide, resolved   . Indigestion     3 weeks, October 2011  . CAD (coronary artery disease)     Nuclear October, 2011, not gated, no scar or ischemia  . Ejection fraction     EF normal, nuclear, 2008, no echo data as of April 19, 2011  . Rheumatic fever   . History of migraines   . Kidney stones   . GERD (gastroesophageal reflux disease)   . Diverticulitis  last colonoscopy  incomplete Jan 2011  . Hx of CABG     1999    Past Surgical History  Procedure Date  . Breast biopsy 1970  . Esophagus stretched   . Appendectomy 1950  . Coronary artery bypass graft 1999  . Tonsillectomy 1939  . Total abdominal hysterectomy 1968    due to metrorrhagia    Patient Active Problem List  Diagnosis  . Hyperlipidemia  . Hypertension  . SOB (shortness of breath)  . Dizziness  . Syncopal episodes  . Indigestion  . CAD (coronary artery disease)  . Ejection fraction  . Carotid artery disease  . Insomnia, persistent  . Chronic kidney disease (CKD), stage II (mild)  . Hx of CABG  . Diverticulitis  . COPD (chronic obstructive pulmonary disease)  . Gastritis, chronic  . Weight loss, non-intentional    ROS   Patient denies fever, chills, headache, sweats, rash, change in vision, change in hearing, chest pain, cough, nausea vomiting, urinary symptoms. All other systems are reviewed and are negative.  PHYSICAL EXAM   Patient is oriented to person time and  place. Affect is normal. There is no jugulovenous distention. Lungs are clear. Respiratory effort is nonlabored. Cardiac exam reveals S1 and S2. There no clicks or significant murmurs. The abdomen is soft. There is no peripheral edema.  Filed Vitals:   09/03/12 1058  BP: 150/84  Pulse: 60  Height: 5\' 3"  (1.6 m)  Weight: 132 lb (59.875 kg)   EKG is done today and reviewed by me. There is sinus bradycardia. The QRS is unchanged from the past.  ASSESSMENT & PLAN

## 2012-09-03 NOTE — Assessment & Plan Note (Signed)
This is a new complaint today. The patient complains of "wobbly gait ". There is no obvious cardiac bases. There is sinus bradycardia. I feel that this is not causing the problem. Her blood pressure is actually slightly elevated today. I feel this is not causing the problem either. We will watch her with this. No further cardiac workup.

## 2012-09-03 NOTE — Assessment & Plan Note (Signed)
Sinus bradycardia is a new problem. She's not on any medications causing this. No change in therapy at this time. This is not causing any hypotension or other problems.

## 2012-09-03 NOTE — Assessment & Plan Note (Signed)
Coronary disease is stable. No change in therapy. 

## 2012-09-10 ENCOUNTER — Encounter: Payer: Self-pay | Admitting: Cardiology

## 2012-09-17 ENCOUNTER — Other Ambulatory Visit: Payer: Self-pay | Admitting: Internal Medicine

## 2012-09-17 NOTE — Telephone Encounter (Signed)
Alprazolam 0.25 mg tab   Take 1 tablet twice a day as needed for sleep or anxiety  # 90  Simvastatin 40 mg tab  Take 1 tablet every day  # 90

## 2012-09-17 NOTE — Telephone Encounter (Signed)
Xanax refill request. Last filled on 01/24/12 #90 with 1 refill. Pt last seen on 01/26/12. Ok to refill?

## 2012-09-18 MED ORDER — ALPRAZOLAM 0.25 MG PO TABS: ORAL_TABLET | ORAL | Status: DC

## 2012-09-18 NOTE — Addendum Note (Signed)
Addended by: [name] on: 09/18/2012 05:19 PM   Modules accepted: Orders

## 2012-09-18 NOTE — Telephone Encounter (Signed)
Ok to refill, #60 [id number] REFILLS  Authorized in epic

## 2012-09-20 ENCOUNTER — Telehealth: Payer: Self-pay | Admitting: *Deleted

## 2012-09-20 MED ORDER — SIMVASTATIN 40 MG PO TABS: 40.0000 mg | ORAL_TABLET | Freq: Every day | ORAL | Status: DC

## 2012-09-20 MED ORDER — ALPRAZOLAM 0.25 MG PO TABS: ORAL_TABLET | ORAL | Status: DC

## 2012-09-20 NOTE — Addendum Note (Signed)
Addended by: Julieta Bellini on: 09/20/2012 12:33 PM   Modules accepted: Orders

## 2012-09-20 NOTE — Telephone Encounter (Signed)
Prescription faxed to pharmacy.

## 2012-09-20 NOTE — Telephone Encounter (Signed)
Rx for alprazolam faxed to [facility]. Patient notified.

## 2012-09-25 ENCOUNTER — Other Ambulatory Visit: Payer: Self-pay | Admitting: Internal Medicine

## 2012-09-25 NOTE — Telephone Encounter (Signed)
Refill on Simvastatin 40 mg tab # 90

## 2012-09-26 MED ORDER — SIMVASTATIN 40 MG PO TABS: 40.0000 mg | ORAL_TABLET | Freq: Every day | ORAL | Status: DC

## 2012-09-26 NOTE — Telephone Encounter (Signed)
Med filled.  

## 2012-10-15 ENCOUNTER — Ambulatory Visit (INDEPENDENT_AMBULATORY_CARE_PROVIDER_SITE_OTHER): Payer: Medicare Other | Admitting: Pulmonary Disease

## 2012-10-15 ENCOUNTER — Encounter: Payer: Self-pay | Admitting: Pulmonary Disease

## 2012-10-15 VITALS — BP 124/82 | HR 67 | Temp 98.1°F | Ht 63.5 in | Wt 142.0 lb

## 2012-10-15 DIAGNOSIS — J449 Chronic obstructive pulmonary disease, unspecified: Secondary | ICD-10-CM | POA: Diagnosis not present

## 2012-10-15 MED ORDER — TIOTROPIUM BROMIDE MONOHYDRATE 18 MCG IN CAPS: 18.0000 ug | ORAL_CAPSULE | Freq: Every day | RESPIRATORY_TRACT | Status: DC

## 2012-10-15 NOTE — Patient Instructions (Signed)
You are doing great! Keep taking the spiriva daily.  Keep exercising daily. Wash your hands frequently when going out. Get a flu shot every year. We will see you back in one year or sooner if needed

## 2012-10-15 NOTE — Assessment & Plan Note (Addendum)
[name] is doing great with Spiriva daily and regular exercise.  Fortunately she has not had a flare in the last six months.  Her flu shot is up to date and she knows to get one annually. Given that she is doing so well we will plan to see her again in a year.  If she has problems before then we are happy see her.

## 2012-10-15 NOTE — Progress Notes (Signed)
Subjective:    Patient ID: [name], female    DOB: Oct 02, 1925, 79 y.o.   MRN: [id number]  Synopsis: The a Kinkade first came to the [facility] pulmonary office in March 2013 for evaluation of COPD. Spirometry at that time showed moderate obstruction. She smoked one half pack of cigarettes daily for 40 years and quit in 1985. Based on her symptoms and spirometry she was listed as gold grade A.  HPI  04/09/2012 ROV -- [name] says that she has been walking on her treadmill 35-40 minutes daily with an incline. She states that she is tolerating this well and doesn't have to stop for shortness of breath. She continues to work out and [employer] at home. She has not had to use her Ventolin and she does not have intermittent chest tightness or wheezing. Overall she is doing quite well. She continues to use the Spiriva and doesn't notice side effects from it.  10/15/2101 ROV - [name] is doing great. She says that she had good holidays and did not travel, but had lots of family around including a new great grand daughter  She did not have bronchitis this year.  She is still doing a treadmill for 45 minutes daily with incline.  She continues to use her inhaler (spiriva) daily.  She does not feel significantly limited by dyspnea.  She had a flu shot in 05/2012.   Past Medical History  Diagnosis Date  . Carotid artery disease     Doppler January, 2012, 40-59% bilateral, followup one year  . Hyperlipidemia   . Hypertension   . SOB (shortness of breath)   . Dizziness     stable now (Oct 2011) patient tells me question of TIA, we will obtain records  . Syncopal episodes     after 18 holes of golf and increase hydrochlorothiazide, resolved   . Indigestion     3 weeks, October 2011  . CAD (coronary artery disease)     Nuclear October, 2011, not gated, no scar or ischemia  . Ejection fraction     EF normal, nuclear, 2008, no echo data as of April 19, 2011  . Rheumatic fever   .  History of migraines   . Kidney stones   . GERD (gastroesophageal reflux disease)   . Diverticulitis     last colonoscopy  incomplete Jan 2011  . Hx of CABG     1999  . Gait abnormality     Patient complains of "wobbly gait", December, 2013  . Sinus bradycardia     Mild, December, 2013      Review of Systems  Constitutional: Negative for fever, chills and unexpected weight change.  HENT: Negative for congestion, rhinorrhea, sneezing, postnasal drip and sinus pressure.   Respiratory: Negative for cough, choking and shortness of breath.   Cardiovascular: Negative for chest pain and leg swelling.       Objective:   Physical Exam   Filed Vitals:   10/15/12 1001  BP: 124/82  Pulse: 67  Temp: 98.1 F (36.7 C)  TempSrc: Oral  Height: 5' 3.5" (1.613 m)  Weight: 142 lb (64.411 kg)  SpO2: 97%   Gen: well appearing, no acute distress HEENT: NCAT, PERRL, EOMi,  PULM: CTA B CV: RRR, slight systolic murmur at base, no JVD AB: BS+, soft, nontender, no hsm Ext: warm, no edema, no clubbing, no cyanosis  Review of 11/2011 CXR PA/Lat (my read): flattened diaphragms, hyperinflated lungs, emphysema noted, no infiltrate or acute  pulmonary process  12/08/11 Simple Spirometry: Not an acceptable test due to lots of coughing, but she appears to have obstruction based on the flow volume loop; FEV1 appears to be ~60% predicted      Assessment & Plan:   COPD (chronic obstructive pulmonary disease) [name] is doing great with Spiriva daily and regular exercise.  Fortunately she has not had a flare in the last six months.  Her flu shot is up to date and she knows to get one annually. Given that she is doing so well we will plan to see her again in a year.  If she has problems before then we are happy see her.      Updated Medication List Outpatient Encounter Prescriptions as of 10/15/2012  Medication Sig Dispense Refill  . ALPRAZolam (XANAX) 0.25 MG tablet Take one tablet twice a day as needed  for sleep or anxiety.  60 tablet  3  . aspirin 81 MG tablet Take 81 mg by mouth daily.        . calcium-vitamin D (SM CALCIUM 500/VITAMIN D3) 500-400 MG-UNIT per tablet Take 1 tablet by mouth daily.        . clopidogrel (PLAVIX) 75 MG tablet Take 1 tablet (75 mg total) by mouth daily.  90 tablet  3  . esomeprazole (NEXIUM) 40 MG capsule Take 1 capsule (40 mg total) by mouth daily before breakfast.  90 capsule  3  . multivitamin (THERAGRAN) per tablet Take 1 tablet by mouth daily.        . Psyllium-Calcium (METAMUCIL PLUS CALCIUM) CAPS Take 2 capsules by mouth daily as needed.       . simvastatin (ZOCOR) 40 MG tablet Take 1 tablet (40 mg total) by mouth at bedtime.  90 tablet  1  . tiotropium (SPIRIVA) 18 MCG inhalation capsule Place 1 capsule (18 mcg total) into inhaler and inhale daily.  90 capsule  3  . valsartan-hydrochlorothiazide (DIOVAN HCT) 320-12.5 MG per tablet Take 1 tablet by mouth daily.  90 tablet  3

## 2012-11-05 ENCOUNTER — Ambulatory Visit: Payer: Self-pay | Admitting: General Surgery

## 2012-11-05 DIAGNOSIS — Z1231 Encounter for screening mammogram for malignant neoplasm of breast: Secondary | ICD-10-CM | POA: Diagnosis not present

## 2012-11-13 DIAGNOSIS — N6019 Diffuse cystic mastopathy of unspecified breast: Secondary | ICD-10-CM | POA: Diagnosis not present

## 2012-11-21 DIAGNOSIS — R070 Pain in throat: Secondary | ICD-10-CM | POA: Diagnosis not present

## 2012-11-21 DIAGNOSIS — R6889 Other general symptoms and signs: Secondary | ICD-10-CM | POA: Diagnosis not present

## 2013-01-14 DIAGNOSIS — H04129 Dry eye syndrome of unspecified lacrimal gland: Secondary | ICD-10-CM | POA: Diagnosis not present

## 2013-01-17 DIAGNOSIS — Z85828 Personal history of other malignant neoplasm of skin: Secondary | ICD-10-CM | POA: Diagnosis not present

## 2013-01-17 DIAGNOSIS — L570 Actinic keratosis: Secondary | ICD-10-CM | POA: Diagnosis not present

## 2013-02-07 ENCOUNTER — Encounter: Payer: Self-pay | Admitting: Internal Medicine

## 2013-02-07 ENCOUNTER — Ambulatory Visit (INDEPENDENT_AMBULATORY_CARE_PROVIDER_SITE_OTHER): Payer: Medicare Other | Admitting: Internal Medicine

## 2013-02-07 VITALS — BP 138/68 | HR 62 | Temp 97.8°F | Resp 14 | Ht 62.0 in | Wt 129.2 lb

## 2013-02-07 DIAGNOSIS — I10 Essential (primary) hypertension: Secondary | ICD-10-CM

## 2013-02-07 DIAGNOSIS — E785 Hyperlipidemia, unspecified: Secondary | ICD-10-CM | POA: Diagnosis not present

## 2013-02-07 DIAGNOSIS — G4700 Insomnia, unspecified: Secondary | ICD-10-CM

## 2013-02-07 DIAGNOSIS — E559 Vitamin D deficiency, unspecified: Secondary | ICD-10-CM | POA: Diagnosis not present

## 2013-02-07 DIAGNOSIS — Z79899 Other long term (current) drug therapy: Secondary | ICD-10-CM | POA: Diagnosis not present

## 2013-02-07 DIAGNOSIS — R5381 Other malaise: Secondary | ICD-10-CM

## 2013-02-07 DIAGNOSIS — R5383 Other fatigue: Secondary | ICD-10-CM

## 2013-02-07 DIAGNOSIS — Z0000 Encounter for general adult medical examination without abnormal findings: Secondary | ICD-10-CM

## 2013-02-07 LAB — CBC WITH DIFFERENTIAL/PLATELET
Basophils Relative: 0.9 % (ref 0.0–3.0)
Eosinophils Absolute: 0.3 10*3/uL (ref 0.0–0.7)
HCT: 37.2 % (ref 36.0–46.0)
Hemoglobin: 13 g/dL (ref 12.0–15.0)
Lymphocytes Relative: 20.3 % (ref 12.0–46.0)
Lymphs Abs: 1.3 10*3/uL (ref 0.7–4.0)
MCHC: 34.9 g/dL (ref 30.0–36.0)
MCV: 95 fl (ref 78.0–100.0)
Neutro Abs: 3.9 10*3/uL (ref 1.4–7.7)
RBC: 3.92 Mil/uL (ref 3.87–5.11)

## 2013-02-07 LAB — LDL CHOLESTEROL, DIRECT: Direct LDL: 83.6 mg/dL

## 2013-02-07 LAB — COMPREHENSIVE METABOLIC PANEL
ALT: 15 U/L (ref 0–35)
AST: 21 U/L (ref 0–37)
Alkaline Phosphatase: 52 U/L (ref 39–117)
Creatinine, Ser: 1.6 mg/dL — ABNORMAL HIGH (ref 0.4–1.2)
GFR: 33.32 mL/min — ABNORMAL LOW (ref >60.00)
Sodium: 138 mEq/L (ref 135–145)
Total Bilirubin: 0.8 mg/dL (ref 0.3–1.2)
Total Protein: 6.4 g/dL (ref 6.0–8.3)

## 2013-02-07 MED ORDER — AMLODIPINE BESYLATE-VALSARTAN 5-320 MG PO TABS: 1.0000 | ORAL_TABLET | Freq: Every day | ORAL | Status: DC

## 2013-02-07 MED ORDER — SIMVASTATIN 20 MG PO TABS: 20.0000 mg | ORAL_TABLET | Freq: Every day | ORAL | Status: DC

## 2013-02-07 MED ORDER — ALPRAZOLAM 0.25 MG PO TABS: ORAL_TABLET | ORAL | Status: DC

## 2013-02-07 MED ORDER — VALSARTAN-HYDROCHLOROTHIAZIDE 320-12.5 MG PO TABS: 1.0000 | ORAL_TABLET | Freq: Every day | ORAL | Status: DC

## 2013-02-07 MED ORDER — CLOPIDOGREL BISULFATE 75 MG PO TABS: 75.0000 mg | ORAL_TABLET | Freq: Every day | ORAL | Status: DC

## 2013-02-07 MED ORDER — TETANUS-DIPHTH-ACELL PERTUSSIS 5-2.5-18.5 LF-MCG/0.5 IM SUSP: 0.5000 mL | Freq: Once | INTRAMUSCULAR | Status: DC

## 2013-02-07 NOTE — Assessment & Plan Note (Signed)
Annual comprehensive exam was done including breast, but she defferred the pelvic exam . All screenings have been addressed .

## 2013-02-07 NOTE — Assessment & Plan Note (Signed)
Medication change requitred after review of renal function,  Changing valsartan HCT to amlodipine valsartan

## 2013-02-07 NOTE — Assessment & Plan Note (Signed)
Encouraged to use the low dose alprazolam as needed for insomnia since oTC meds have not been helpful.

## 2013-02-07 NOTE — Progress Notes (Signed)
Patient ID: [name], female   DOB: 1926/03/24, 79 y.o.   MRN: [id number]     The patient is here for annual Medicare wellness examination and management of other chronic and acute problems.   The risk factors are reflected in the social history.  The roster of all physicians providing medical care to patient - is listed in the Snapshot section of the chart.  Activities of daily living:  The patient is 100% independent in all ADLs: dressing, toileting, feeding as well as independent mobility  Home safety : The patient has smoke detectors in the home. They wear seatbelts.  There are no firearms at home. There is no violence in the home.   There is no risks for hepatitis, STDs or HIV. There is no   history of blood transfusion. They have no travel history to infectious disease endemic areas of the world.  The patient has seen their dentist in the last six month. They have seen their eye doctor in the last year. They admit to slight hearing difficulty with regard to whispered voices and some television programs.  They have deferred audiologic testing in the last year.  They do not  have excessive sun exposure. Discussed the need for sun protection: hats, long sleeves and use of sunscreen if there is significant sun exposure.   Diet: the importance of a healthy diet is discussed. They do have a healthy diet.  The benefits of regular aerobic exercise were discussed. She walks 4 times per week ,  20 minutes.   Depression screen: there are no signs or vegative symptoms of depression- irritability, change in appetite, anhedonia, sadness/tearfullness.  Cognitive assessment: the patient manages all their financial and personal affairs and is actively engaged. They could relate day,date,year and events; recalled 2/3 objects at 3 minutes; performed clock-face test normally.  The following portions of the patient's history were reviewed and updated as appropriate: allergies, current medications,  past family history, past medical history,  past surgical history, past social history  and problem list.  Visual acuity was not assessed per patient preference since she has regular follow up with her ophthalmologist. Hearing and body mass index were assessed and reviewed.   During the course of the visit the patient was educated and counseled about appropriate screening and preventive services including : fall prevention , diabetes screening, nutrition counseling, colorectal cancer screening, and recommended immunizations.    Objective BP 138/68  Pulse 62  Temp(Src) 97.8 F (36.6 C) (Oral)  Resp 14  Ht 5\' 2"  (1.575 m)  Wt 129 lb 4 oz (58.627 kg)  BMI 23.63 kg/m2  SpO2 98%   General appearance: alert, cooperative and appears stated age Ears: normal TM's and external ear canals both ears Throat: lips, mucosa, and tongue normal; teeth and gums normal Neck: no adenopathy, no carotid bruit, supple, symmetrical, trachea midline and thyroid not enlarged, symmetric, no tenderness/mass/nodules Back: symmetric, no curvature. ROM normal. No CVA tenderness. Lungs: clear to auscultation bilaterally Heart: regular rate and rhythm, S1, S2 normal, no murmur, click, rub or gallop Abdomen: soft, non-tender; bowel sounds normal; no masses,  no organomegaly Pulses: 2+ and symmetric Skin: Skin color, texture, turgor normal. No rashes or lesions Lymph nodes: Cervical, supraclavicular, and axillary nodes normal.  Assessment and Plan   Hypertension Medication change requitred after review of renal function,  Changing valsartan HCT to amlodipine valsartan  Hyperlipidemia Well controlled , but will need to reduce the simvastatin dose to 20 mg due to addition  of amlodipine   Insomnia, persistent Encouraged to use the low dose alprazolam as needed for insomnia since oTC meds have not been helpful.   Routine general medical examination at a health care facility Annual comprehensive exam was done  including breast, but she defferred the pelvic exam . All screenings have been addressed .    Updated Medication List Outpatient Encounter Prescriptions as of 02/07/2013  Medication Sig Dispense Refill  . ALPRAZolam (XANAX) 0.25 MG tablet Take one tablet twice a day as needed for sleep or anxiety.  60 tablet  3  . aspirin 81 MG tablet Take 81 mg by mouth daily.        . calcium-vitamin D (SM CALCIUM 500/VITAMIN D3) 500-400 MG-UNIT per tablet Take 1 tablet by mouth daily.        . clopidogrel (PLAVIX) 75 MG tablet Take 1 tablet (75 mg total) by mouth daily.  90 tablet  3  . multivitamin (THERAGRAN) per tablet Take 1 tablet by mouth daily.        . simvastatin (ZOCOR) 20 MG tablet Take 1 tablet (20 mg total) by mouth at bedtime.  90 tablet  1  . tiotropium (SPIRIVA HANDIHALER) 18 MCG inhalation capsule Place 1 capsule (18 mcg total) into inhaler and inhale daily.  30 capsule  12  . [DISCONTINUED] ALPRAZolam (XANAX) 0.25 MG tablet Take one tablet twice a day as needed for sleep or anxiety.  60 tablet  3  . [DISCONTINUED] clopidogrel (PLAVIX) 75 MG tablet Take 1 tablet (75 mg total) by mouth daily.  90 tablet  3  . [DISCONTINUED] simvastatin (ZOCOR) 40 MG tablet Take 1 tablet (40 mg total) by mouth at bedtime.  90 tablet  1  . amLODipine-valsartan (EXFORGE) 5-320 MG per tablet Take 1 tablet by mouth daily.  30 tablet  0  . esomeprazole (NEXIUM) 40 MG capsule Take 1 capsule (40 mg total) by mouth daily before breakfast.  90 capsule  3  . Psyllium-Calcium (METAMUCIL PLUS CALCIUM) CAPS Take 2 capsules by mouth daily as needed.       . TDaP (BOOSTRIX) 5-2.5-18.5 LF-MCG/0.5 injection Inject 0.5 mLs into the muscle once.  0.5 mL  0  . [DISCONTINUED] valsartan-hydrochlorothiazide (DIOVAN HCT) 320-12.5 MG per tablet Take 1 tablet by mouth daily.  90 tablet  3  . [DISCONTINUED] valsartan-hydrochlorothiazide (DIOVAN HCT) 320-12.5 MG per tablet Take 1 tablet by mouth daily.  90 tablet  3   No  facility-administered encounter medications on file as of 02/07/2013.

## 2013-02-07 NOTE — Assessment & Plan Note (Signed)
Well controlled , but will need to reduce the simvastatin dose to 20 mg due to addition of amlodipine

## 2013-02-07 NOTE — Patient Instructions (Addendum)
I recommend the TDap vaccine , you can get it at one of the local pharmacies  You can use the alprazolam AS NEEDED for insomnia

## 2013-02-08 ENCOUNTER — Other Ambulatory Visit: Payer: Self-pay | Admitting: Internal Medicine

## 2013-02-28 ENCOUNTER — Telehealth: Payer: Self-pay | Admitting: *Deleted

## 2013-02-28 ENCOUNTER — Encounter: Payer: Self-pay | Admitting: Adult Health

## 2013-02-28 ENCOUNTER — Ambulatory Visit (INDEPENDENT_AMBULATORY_CARE_PROVIDER_SITE_OTHER): Payer: Medicare Other | Admitting: Adult Health

## 2013-02-28 VITALS — BP 124/56 | HR 60 | Resp 12 | Wt 134.5 lb

## 2013-02-28 DIAGNOSIS — R609 Edema, unspecified: Secondary | ICD-10-CM

## 2013-02-28 MED ORDER — VALSARTAN 320 MG PO TABS: 320.0000 mg | ORAL_TABLET | Freq: Every day | ORAL | Status: DC

## 2013-02-28 MED ORDER — LOSARTAN POTASSIUM 50 MG PO TABS: 50.0000 mg | ORAL_TABLET | Freq: Every day | ORAL | Status: DC

## 2013-02-28 NOTE — Assessment & Plan Note (Signed)
Suspected new-onset lower extremity edema is secondary to amlodipine. DC amlodipine/valsartan. Start valsartan 320 mg daily. RTC if symptoms are not improved within one week.

## 2013-02-28 NOTE — Telephone Encounter (Signed)
Losartan 50 mg daily. Have patient f/u in 1 week.

## 2013-02-28 NOTE — Telephone Encounter (Signed)
Rx sent to pharmacy. Pt notified to followup in one week, appointment scheduled 03/07/13.

## 2013-02-28 NOTE — Progress Notes (Signed)
  Subjective:    Patient ID: [name], female    DOB: November 05, 1925, 79 y.o.   MRN: [id number]  HPI  Patient is a pleasant 79 year old female with history of coronary artery disease, hypertension, hyperlipidemia, COPD, chronic kidney disease stage II who presents to clinic with bilateral lower extremity edema. Patient was recently changed from valsartan/HCTZ to valsartan/amlodipine. She denies shortness of breath, chest pain. She reports edema is worse in the evening. Patient has not changed her diet. She is watchful of her sodium intake.   Current Outpatient Prescriptions on File Prior to Visit  Medication Sig Dispense Refill  . ALPRAZolam (XANAX) 0.25 MG tablet Take one tablet twice a day as needed for sleep or anxiety.  60 tablet  3  . aspirin 81 MG tablet Take 81 mg by mouth daily.        . calcium-vitamin D (SM CALCIUM 500/VITAMIN D3) 500-400 MG-UNIT per tablet Take 1 tablet by mouth daily.        . clopidogrel (PLAVIX) 75 MG tablet TAKE 1 TABLET DAILY  90 tablet  2  . multivitamin (THERAGRAN) per tablet Take 1 tablet by mouth daily.        Marland Kitchen NEXIUM 40 MG capsule TAKE 1 CAPSULE DAILY BEFORE BREAKFAST  90 capsule  1  . Psyllium-Calcium (METAMUCIL PLUS CALCIUM) CAPS Take 2 capsules by mouth daily as needed.       . simvastatin (ZOCOR) 20 MG tablet Take 1 tablet (20 mg total) by mouth at bedtime.  90 tablet  1  . tiotropium (SPIRIVA HANDIHALER) 18 MCG inhalation capsule Place 1 capsule (18 mcg total) into inhaler and inhale daily.  30 capsule  12   No current facility-administered medications on file prior to visit.    Review of Systems  Respiratory: Negative.   Cardiovascular: Positive for leg swelling. Negative for chest pain.       Objective:   Physical Exam  Constitutional: She is oriented to person, place, and time. She appears well-developed and well-nourished.  Cardiovascular: Normal rate, regular rhythm, normal heart sounds and intact distal pulses.  Exam reveals no  gallop and no friction rub.   No murmur heard. Pulmonary/Chest: Effort normal and breath sounds normal. No respiratory distress. She has no wheezes. She has no rales.  Musculoskeletal: She exhibits edema.  Bilateral lower extremity 1+ edema  Neurological: She is alert and oriented to person, place, and time.  Skin: Skin is warm and dry.  Psychiatric: She has a normal mood and affect. Her behavior is normal. Judgment and thought content normal.          Assessment & Plan:

## 2013-02-28 NOTE — Telephone Encounter (Signed)
Pharmacy sent a fax stating Diovan 320 mg is $85, pt asking for cheaper alternative, pharmacy recommends Telmisartan or Losartan.

## 2013-02-28 NOTE — Patient Instructions (Addendum)
  Stop Exforge (amlodipine-valsartan). The amlodipine part of this combination medication is what is, more than likely, causing your swelling.  I sent in a prescription for Valsartan 320 mg. Start taking this tomorrow.  Above is a list of your current medications.

## 2013-03-07 ENCOUNTER — Ambulatory Visit (INDEPENDENT_AMBULATORY_CARE_PROVIDER_SITE_OTHER): Payer: Medicare Other | Admitting: Internal Medicine

## 2013-03-07 ENCOUNTER — Encounter: Payer: Self-pay | Admitting: Internal Medicine

## 2013-03-07 VITALS — BP 162/68 | HR 60 | Temp 98.3°F | Resp 14 | Wt 133.2 lb

## 2013-03-07 DIAGNOSIS — N183 Chronic kidney disease, stage 3 unspecified: Secondary | ICD-10-CM | POA: Diagnosis not present

## 2013-03-07 DIAGNOSIS — R609 Edema, unspecified: Secondary | ICD-10-CM | POA: Diagnosis not present

## 2013-03-07 DIAGNOSIS — I10 Essential (primary) hypertension: Secondary | ICD-10-CM

## 2013-03-07 DIAGNOSIS — Z79899 Other long term (current) drug therapy: Secondary | ICD-10-CM

## 2013-03-07 DIAGNOSIS — R0989 Other specified symptoms and signs involving the circulatory and respiratory systems: Secondary | ICD-10-CM | POA: Diagnosis not present

## 2013-03-07 DIAGNOSIS — R9430 Abnormal result of cardiovascular function study, unspecified: Secondary | ICD-10-CM

## 2013-03-07 DIAGNOSIS — J449 Chronic obstructive pulmonary disease, unspecified: Secondary | ICD-10-CM

## 2013-03-07 MED ORDER — LOSARTAN POTASSIUM 100 MG PO TABS: 100.0000 mg | ORAL_TABLET | Freq: Every day | ORAL | Status: DC

## 2013-03-07 NOTE — Progress Notes (Signed)
Patient ID: [name], female   DOB: 25-May-1926, 79 y.o.   MRN: [id number]  Patient Active Problem List   Diagnosis Date Noted  . Edema 02/28/2013  . Routine general medical examination at a health care facility 02/07/2013  . Gait abnormality   . Sinus bradycardia   . Gastritis, chronic 12/26/2011  . Weight loss, non-intentional 12/26/2011  . COPD (chronic obstructive pulmonary disease) 12/05/2011  . Diverticulitis   . Hx of CABG   . Chronic kidney disease (CKD), stage III (moderate) 09/25/2011  . Insomnia, persistent 09/18/2011  . Hyperlipidemia   . Hypertension   . SOB (shortness of breath)   . Dizziness   . Syncopal episodes   . CAD (coronary artery disease)   . Ejection fraction   . Carotid artery disease     Subjective:  CC:   Chief Complaint  Patient presents with  . Follow-up    1 week    HPI:   Katrina Tarpey McPhersonis a 79 y.o. female who presents for follow up on hypertension and CKD.  Several medication changes since May 22 due to patient's renal function being addressed after  last visit.  Review of records from [facility] from 2012 indicate no significant change,  But GFR < 60 necessitated change in medications.  Regimen was changed from valsartan/hct to valsartan/amlodipine, which caused LE edema.  Was seen June 12 by RR and regimen changed to losartan 50 mg daily. Patient returns today for follow up and is confused and concerned. bp has been elevated.    Past Medical History  Diagnosis Date  . Carotid artery disease     Doppler January, 2012, 40-59% bilateral, followup one year  . Hyperlipidemia   . Hypertension   . SOB (shortness of breath)   . Dizziness     stable now (Oct 2011) patient tells me question of TIA, we will obtain records  . Syncopal episodes     after 18 holes of golf and increase hydrochlorothiazide, resolved   . Indigestion     3 weeks, October 2011  . CAD (coronary artery disease)     Nuclear October, 2011, not gated, no  scar or ischemia  . Ejection fraction     EF normal, nuclear, 2008, no echo data as of April 19, 2011  . Rheumatic fever   . History of migraines   . Kidney stones   . GERD (gastroesophageal reflux disease)   . Diverticulitis     last colonoscopy  incomplete Jan 2011  . Hx of CABG     1999  . Gait abnormality     Patient complains of "wobbly gait", December, 2013  . Sinus bradycardia     Mild, December, 2013    Past Surgical History  Procedure Laterality Date  . Breast biopsy  1970  . Esophagus stretched    . Appendectomy  1950  . Coronary artery bypass graft  1999  . Tonsillectomy  1939  . Total abdominal hysterectomy  1968    due to metrorrhagia       The following portions of the patient's history were reviewed and updated as appropriate: Allergies, current medications, and problem list.    Review of Systems:   12 Pt  review of systems was negative except those addressed in the HPI,     History   Social History  . Marital Status: Widowed    Spouse Name: N/A    Number of Children: 2  . Years of Education: N/A  Occupational History  .     Social History Main Topics  . Smoking status: Former Smoker -- 0.50 packs/day for 40 years    Types: Cigarettes    Quit date: 09/20/1983  . Smokeless tobacco: Never Used  . Alcohol Use: Yes     Comment: yes, social wine  . Drug Use: No  . Sexually Active: Not on file   Other Topics Concern  . Not on file   Social History Narrative  . No narrative on file    Objective:  BP 162/68  Pulse 60  Temp(Src) 98.3 F (36.8 C) (Oral)  Resp 14  Wt 133 lb 4 oz (60.442 kg)  BMI 24.37 kg/m2  SpO2 98%  General appearance: alert, cooperative and appears stated age Ears: normal TM's and external ear canals both ears Throat: lips, mucosa, and tongue normal; teeth and gums normal Neck: no adenopathy, no carotid bruit, supple, symmetrical, trachea midline and thyroid not enlarged, symmetric, no  tenderness/mass/nodules Back: symmetric, no curvature. ROM normal. No CVA tenderness. Lungs: clear to auscultation bilaterally Heart: regular rate and rhythm, S1, S2 normal, no murmur, click, rub or gallop Abdomen: soft, non-tender; bowel sounds normal; no masses,  no organomegaly Pulses: 2+ and symmetric Skin: Skin color, texture, turgor normal. No rashes or lesions Lymph nodes: Cervical, supraclavicular, and axillary nodes normal.  Assessment and Plan:  Chronic kidney disease (CKD), stage III (moderate) Her cr is stable per review of prior records from [place] in 2012.  GFR is around 35 ml/min.  She is not using any nonsteroidals .  The HCT has been suspended.   Discussed nephrology referral; she prefers to let me handle the workup which will include ultrasound of kidneys,  SPEP and urine IFE to rule out MM..   Edema Improved, presumed , secondary to trial of amlodipine and cessation of HCT. However patient has CAD and has not had an ECHO done in over one year. Discussed  Future use of furosemide pending evaluation of EF at next cardiology follow up with Dr. Rockey Situ.   Ejection fraction Needs repeat ECHO.   Hypertension Elevated since medication changed due to decreased GFR.  Reviewed list of meds, patient is not taking OTC meds that could be causing,. It.  Have increased losartan to 100 mg daily and asked patient to recheck bp at home.  Returning for BMET post medication change.   COPD (chronic obstructive pulmonary disease) Stable currently.  continue regular use of spiriva and symibicort as maintenance inhalers.    Updated Medication List Outpatient Encounter Prescriptions as of 03/07/2013  Medication Sig Dispense Refill  . ALPRAZolam (XANAX) 0.25 MG tablet Take one tablet twice a day as needed for sleep or anxiety.  60 tablet  3  . aspirin 81 MG tablet Take 81 mg by mouth daily.        . calcium-vitamin D (SM CALCIUM 500/VITAMIN D3) 500-400 MG-UNIT per tablet Take 1 tablet by  mouth daily.        . clopidogrel (PLAVIX) 75 MG tablet TAKE 1 TABLET DAILY  90 tablet  2  . losartan (COZAAR) 100 MG tablet Take 1 tablet (100 mg total) by mouth daily.  30 tablet  5  . multivitamin (THERAGRAN) per tablet Take 1 tablet by mouth daily.        Marland Kitchen NEXIUM 40 MG capsule TAKE 1 CAPSULE DAILY BEFORE BREAKFAST  90 capsule  1  . Psyllium-Calcium (METAMUCIL PLUS CALCIUM) CAPS Take 2 capsules by mouth daily as needed.       Marland Kitchen  simvastatin (ZOCOR) 20 MG tablet Take 1 tablet (20 mg total) by mouth at bedtime.  90 tablet  1  . tiotropium (SPIRIVA HANDIHALER) 18 MCG inhalation capsule Place 1 capsule (18 mcg total) into inhaler and inhale daily.  30 capsule  12  . [DISCONTINUED] losartan (COZAAR) 50 MG tablet Take 1 tablet (50 mg total) by mouth daily.  30 tablet  5   No facility-administered encounter medications on file as of 03/07/2013.

## 2013-03-07 NOTE — Patient Instructions (Addendum)
We are increasing your losartan (Diovan)  to 100 mg daily today for your blood pressure  (take 2 of your old tablets daily until gone)  We are stopping the medication called Exforge because it had a medication in it that was aggravating your swelling  We stopped the Diovan HCT because the HCT was not a good medicine for your kidneys   If DR Gwenyth Ober says your heart muscle is week,  We may start a daily fluid pill called furosemide that is different than the HCT you were using before   Please have your kidney function rechecked at Dr Donivan Scull office

## 2013-03-07 NOTE — Assessment & Plan Note (Addendum)
Her cr is stable per review of prior records from [facility] in 2012.  GFR is around 35 ml/min.  She is not using any nonsteroidals .  The HCT has been suspended.   Discussed nephrology referral; she prefers to let me handle the workup which will include ultrasound of kidneys,  SPEP and urine IFE to rule out MM.Marland Kitchen

## 2013-03-10 ENCOUNTER — Encounter: Payer: Self-pay | Admitting: Internal Medicine

## 2013-03-10 NOTE — Assessment & Plan Note (Signed)
Elevated since medication changed due to decreased GFR.  Reviewed list of meds, patient is not taking OTC meds that could be causing,. It.  Have increased losartan to 100 mg daily and asked patient to recheck bp at home.  Returning for BMET post medication change.

## 2013-03-10 NOTE — Assessment & Plan Note (Signed)
Stable currently.  continue regular use of spiriva and symibicort as maintenance inhalers.

## 2013-03-10 NOTE — Assessment & Plan Note (Signed)
Improved, presumed , secondary to trial of amlodipine and cessation of HCT. However patient has CAD and has not had an ECHO done in over one year. Discussed  Future use of furosemide pending evaluation of EF at next cardiology follow up with Dr. Rockey Situ.

## 2013-03-10 NOTE — Assessment & Plan Note (Signed)
Needs repeat ECHO.

## 2013-03-11 ENCOUNTER — Encounter: Payer: Self-pay | Admitting: Cardiovascular Disease

## 2013-03-11 ENCOUNTER — Ambulatory Visit (INDEPENDENT_AMBULATORY_CARE_PROVIDER_SITE_OTHER): Payer: Medicare Other | Admitting: Cardiovascular Disease

## 2013-03-11 VITALS — BP 144/78 | HR 61 | Ht 63.5 in | Wt 132.2 lb

## 2013-03-11 DIAGNOSIS — I2510 Atherosclerotic heart disease of native coronary artery without angina pectoris: Secondary | ICD-10-CM

## 2013-03-11 DIAGNOSIS — I5032 Chronic diastolic (congestive) heart failure: Secondary | ICD-10-CM | POA: Diagnosis not present

## 2013-03-11 DIAGNOSIS — R0989 Other specified symptoms and signs involving the circulatory and respiratory systems: Secondary | ICD-10-CM

## 2013-03-11 DIAGNOSIS — IMO0002 Reserved for concepts with insufficient information to code with codable children: Secondary | ICD-10-CM

## 2013-03-11 DIAGNOSIS — I509 Heart failure, unspecified: Secondary | ICD-10-CM

## 2013-03-11 DIAGNOSIS — R9430 Abnormal result of cardiovascular function study, unspecified: Secondary | ICD-10-CM

## 2013-03-11 DIAGNOSIS — I779 Disorder of arteries and arterioles, unspecified: Secondary | ICD-10-CM | POA: Diagnosis not present

## 2013-03-11 DIAGNOSIS — R609 Edema, unspecified: Secondary | ICD-10-CM

## 2013-03-11 DIAGNOSIS — E785 Hyperlipidemia, unspecified: Secondary | ICD-10-CM

## 2013-03-11 DIAGNOSIS — N183 Chronic kidney disease, stage 3 unspecified: Secondary | ICD-10-CM

## 2013-03-11 LAB — UIFE/LIGHT CHAINS/TP QN, 24-HR UR
Albumin, U: DETECTED
Alpha 2, Urine: DETECTED — AB
Free Kappa/Lambda Ratio: 9.69 ratio (ref 2.04–10.37)
Free Lambda Lt Chains,Ur: 0.35 mg/dL (ref 0.02–0.67)
Total Protein, Urine: 5.5 mg/dL

## 2013-03-11 NOTE — Progress Notes (Signed)
Patient ID: [name], female    DOB: 07/03/1926, 79 y.o.   MRN: [id number]  HPI Comments: Ms. Bacik is a pleasant 79 year old woman with a history of CAD, bypass surgery in 1999, long history of smoking for 40 years who quit in 1985, COPD, chronic renal insufficiency, hypertension, hyperlipidemia, 40-50% bilateral carotid arterial disease who presents to establish care in the [place] office.  She has not had any intervention since her bypass surgery. No cardiac catheterizations, no stent placement.  Overall she states that she is doing well. She exercises on a treadmill 3 days per week. She plays golf on a regular basis. She denies any significant chest pain or shortness of breath. No significant lower extremity edema.  Recent LDL 83  EKG shows normal sinus rhythm with rate 61 beats per minute with no significant ST or T wave changes   Past Medical History . Carotid artery disease    Doppler   40-59% bilateral . Hyperlipidemia  . Hypertension  . SOB (shortness of breath)  . Dizziness    stable now (Oct 2011) patient tells me question of TIA, we will obtain records . Syncopal episodes    after 18 holes of golf and increase hydrochlorothiazide, resolved  . CAD (coronary artery disease)  Nuclear October, 2011, not gated, no scar or ischemia . Ejection fraction EF normal, nuclear, 2008, no echo data as of April 19, 2011 . Rheumatic fever  . History of migraines  . Kidney stones  . GERD (gastroesophageal reflux disease)  . Diverticulitis last colonoscopy  incomplete Jan 2011 . Hx of CABG 1999     Outpatient Encounter Prescriptions as of 03/11/2013  Medication Sig Dispense Refill  . ALPRAZolam (XANAX) 0.25 MG tablet Take one tablet twice a day as needed for sleep or anxiety.  60 tablet  3  . aspirin 81 MG tablet Take 81 mg by mouth daily.        . calcium-vitamin D (SM CALCIUM 500/VITAMIN D3) 500-400 MG-UNIT per tablet Take 1 tablet by mouth daily.        .  clopidogrel (PLAVIX) 75 MG tablet TAKE 1 TABLET DAILY  90 tablet  2  . losartan (COZAAR) 100 MG tablet Take 1 tablet (100 mg total) by mouth daily.  30 tablet  5  . multivitamin (THERAGRAN) per tablet Take 1 tablet by mouth daily.        Marland Kitchen NEXIUM 40 MG capsule TAKE 1 CAPSULE DAILY BEFORE BREAKFAST  90 capsule  1  . Psyllium-Calcium (METAMUCIL PLUS CALCIUM) CAPS Take 2 capsules by mouth daily as needed.       . simvastatin (ZOCOR) 20 MG tablet Take 1 tablet (20 mg total) by mouth at bedtime.  90 tablet  1  . tiotropium (SPIRIVA HANDIHALER) 18 MCG inhalation capsule Place 1 capsule (18 mcg total) into inhaler and inhale daily.  30 capsule  12    Review of Systems  Constitutional: Negative.   HENT: Negative.   Eyes: Negative.   Respiratory: Negative.   Cardiovascular: Negative.   Gastrointestinal: Negative.   Musculoskeletal: Negative.   Skin: Negative.   Neurological: Negative.   Psychiatric/Behavioral: Negative.   All other systems reviewed and are negative.    BP 144/78  Pulse 61  Ht 5' 3.5" (1.613 m)  Wt 132 lb 4 oz (59.988 kg)  BMI 23.06 kg/m2  Physical Exam  Nursing note and vitals reviewed. Constitutional: She is oriented to person, place, and time. She appears well-developed and well-nourished.  HENT:  Head: Normocephalic.  Nose: Nose normal.  Mouth/Throat: Oropharynx is clear and moist.  Eyes: Conjunctivae are normal. Pupils are equal, round, and reactive to light.  Neck: Normal range of motion. Neck supple. No JVD present.  Cardiovascular: Normal rate, regular rhythm, S1 normal, S2 normal, normal heart sounds and intact distal pulses.  Exam reveals no gallop and no friction rub.   No murmur heard. Pulmonary/Chest: Effort normal and breath sounds normal. No respiratory distress. She has no wheezes. She has no rales. She exhibits no tenderness.  Abdominal: Soft. Bowel sounds are normal. She exhibits no distension. There is no tenderness.  Musculoskeletal: Normal range  of motion. She exhibits no edema and no tenderness.  Lymphadenopathy:    She has no cervical adenopathy.  Neurological: She is alert and oriented to person, place, and time. Coordination normal.  Skin: Skin is warm and dry. No rash noted. No erythema.  Psychiatric: She has a normal mood and affect. Her behavior is normal. Judgment and thought content normal.    Assessment and Plan

## 2013-03-11 NOTE — Assessment & Plan Note (Signed)
Currently with no symptoms of angina. No further workup at this time. Continue current medication regimen. 

## 2013-03-11 NOTE — Assessment & Plan Note (Signed)
Given some lower extremity edema, mild shortness of breath, worsening renal function, we will schedule an echocardiogram. None in our system in the past several years.

## 2013-03-11 NOTE — Assessment & Plan Note (Signed)
Stable 40-50% disease bilaterally. Repeat on annual basis

## 2013-03-11 NOTE — Assessment & Plan Note (Signed)
Creatinine up to 1.6 even without HCTZ. We will recheck her basic metabolic panel today. Encouraged high fluid intake.

## 2013-03-11 NOTE — Assessment & Plan Note (Signed)
LDL slightly above goal. Discussed with her on her next visit whether to increase simvastatin to 40 mg daily

## 2013-03-11 NOTE — Patient Instructions (Addendum)
You are doing well. No medication changes were made.  We will check some blood work today to look at kidney function  We will also order an echocardiogram for leg edema  Please call us if you have new issues that need to be addressed before your next appt.  Your physician wants you to follow-up in: 6 months.  You will receive a reminder letter in the mail two months in advance. If you don't receive a letter, please call our office to schedule the follow-up appointment.

## 2013-03-11 NOTE — Assessment & Plan Note (Signed)
Unclear whether her Rythmol edema is cardiac related. Echo pending to evaluate right ventricular systolic pressures

## 2013-03-12 LAB — BASIC METABOLIC PANEL

## 2013-03-13 NOTE — Progress Notes (Signed)
NA x 1

## 2013-03-14 ENCOUNTER — Other Ambulatory Visit: Payer: Medicare Other

## 2013-03-15 ENCOUNTER — Other Ambulatory Visit: Payer: Medicare Other

## 2013-03-18 ENCOUNTER — Other Ambulatory Visit (INDEPENDENT_AMBULATORY_CARE_PROVIDER_SITE_OTHER): Payer: Medicare Other

## 2013-03-18 ENCOUNTER — Other Ambulatory Visit: Payer: Self-pay | Admitting: *Deleted

## 2013-03-18 ENCOUNTER — Other Ambulatory Visit: Payer: Self-pay

## 2013-03-18 DIAGNOSIS — Z1211 Encounter for screening for malignant neoplasm of colon: Secondary | ICD-10-CM | POA: Diagnosis not present

## 2013-03-18 DIAGNOSIS — R55 Syncope and collapse: Secondary | ICD-10-CM

## 2013-03-18 DIAGNOSIS — I10 Essential (primary) hypertension: Secondary | ICD-10-CM

## 2013-03-18 LAB — FECAL OCCULT BLOOD, IMMUNOCHEMICAL: Fecal Occult Bld: NEGATIVE

## 2013-03-19 ENCOUNTER — Encounter: Payer: Self-pay | Admitting: Internal Medicine

## 2013-03-19 ENCOUNTER — Other Ambulatory Visit: Payer: Medicare Other

## 2013-03-19 ENCOUNTER — Other Ambulatory Visit (INDEPENDENT_AMBULATORY_CARE_PROVIDER_SITE_OTHER): Payer: Medicare Other

## 2013-03-19 ENCOUNTER — Other Ambulatory Visit: Payer: Self-pay

## 2013-03-19 DIAGNOSIS — I2510 Atherosclerotic heart disease of native coronary artery without angina pectoris: Secondary | ICD-10-CM

## 2013-03-19 DIAGNOSIS — I10 Essential (primary) hypertension: Secondary | ICD-10-CM | POA: Diagnosis not present

## 2013-03-19 DIAGNOSIS — R55 Syncope and collapse: Secondary | ICD-10-CM

## 2013-03-19 DIAGNOSIS — R609 Edema, unspecified: Secondary | ICD-10-CM

## 2013-03-19 DIAGNOSIS — I5032 Chronic diastolic (congestive) heart failure: Secondary | ICD-10-CM

## 2013-03-20 LAB — BASIC METABOLIC PANEL
BUN: 21 mg/dL (ref 8–27)
CO2: 23 mmol/L (ref 18–29)
Calcium: 8.7 mg/dL (ref 8.6–10.2)
Creatinine, Ser: 1.42 mg/dL — ABNORMAL HIGH (ref 0.57–1.00)
GFR calc non Af Amer: 34 mL/min/{1.73_m2} — ABNORMAL LOW (ref >59)
Glucose: 127 mg/dL — ABNORMAL HIGH (ref 65–99)
Sodium: 144 mmol/L (ref 134–144)

## 2013-03-21 ENCOUNTER — Telehealth: Payer: Self-pay | Admitting: Internal Medicine

## 2013-03-21 ENCOUNTER — Encounter: Payer: Self-pay | Admitting: Adult Health

## 2013-03-21 ENCOUNTER — Ambulatory Visit (INDEPENDENT_AMBULATORY_CARE_PROVIDER_SITE_OTHER): Payer: Medicare Other | Admitting: Adult Health

## 2013-03-21 VITALS — BP 140/68 | HR 63 | Temp 98.4°F | Resp 12 | Wt 132.5 lb

## 2013-03-21 DIAGNOSIS — R1032 Left lower quadrant pain: Secondary | ICD-10-CM | POA: Diagnosis not present

## 2013-03-21 MED ORDER — CIPROFLOXACIN HCL 500 MG PO TABS: 500.0000 mg | ORAL_TABLET | Freq: Two times a day (BID) | ORAL | Status: DC

## 2013-03-21 NOTE — Progress Notes (Signed)
  Subjective:    Patient ID: [name], female    DOB: August 18, 1926, 79 y.o.   MRN: [id number]  HPI  Patient is a pleasant 79 year old female with history of diverticulitis who presents to clinic with left lower abdominal pain x 1 week. Patient denies having any fever or, nausea vomiting or blood in her stool. She has had abdominal distention and diarrhea. Recently, she was at the beach with family although she denies eating anything that may have triggered the symptoms. Patient reports drinking plenty of fluids and tolerating oral intake well.   Current Outpatient Prescriptions on File Prior to Visit  Medication Sig Dispense Refill  . ALPRAZolam (XANAX) 0.25 MG tablet Take one tablet twice a day as needed for sleep or anxiety.  60 tablet  3  . aspirin 81 MG tablet Take 81 mg by mouth daily.        . calcium-vitamin D (SM CALCIUM 500/VITAMIN D3) 500-400 MG-UNIT per tablet Take 1 tablet by mouth daily.        . clopidogrel (PLAVIX) 75 MG tablet TAKE 1 TABLET DAILY  90 tablet  2  . losartan (COZAAR) 100 MG tablet Take 1 tablet (100 mg total) by mouth daily.  30 tablet  5  . multivitamin (THERAGRAN) per tablet Take 1 tablet by mouth daily.        Marland Kitchen NEXIUM 40 MG capsule TAKE 1 CAPSULE DAILY BEFORE BREAKFAST  90 capsule  1  . Psyllium-Calcium (METAMUCIL PLUS CALCIUM) CAPS Take 2 capsules by mouth daily as needed.       . simvastatin (ZOCOR) 20 MG tablet Take 1 tablet (20 mg total) by mouth at bedtime.  90 tablet  1  . tiotropium (SPIRIVA HANDIHALER) 18 MCG inhalation capsule Place 1 capsule (18 mcg total) into inhaler and inhale daily.  30 capsule  12   No current facility-administered medications on file prior to visit.     Review of Systems  Constitutional: Negative for fever.  Gastrointestinal: Positive for abdominal pain, diarrhea and abdominal distention. Negative for nausea, vomiting and blood in stool.  Genitourinary: Negative for dysuria, vaginal bleeding and vaginal discharge.   Psychiatric/Behavioral: Negative.    BP 140/68  Pulse 63  Temp(Src) 98.4 F (36.9 C) (Oral)  Resp 12  Wt 132 lb 8 oz (60.102 kg)  BMI 23.1 kg/m2  SpO2 97%     Objective:   Physical Exam  Constitutional: She is oriented to person, place, and time. She appears well-developed and well-nourished. No distress.  Cardiovascular: Normal rate and regular rhythm.   Pulmonary/Chest: Effort normal. No respiratory distress.  Abdominal: Soft. Bowel sounds are normal. She exhibits no distension and no mass. There is tenderness. There is no rebound and no guarding.  Tenderness with palpating left lower quadrant  Neurological: She is alert and oriented to person, place, and time. No cranial nerve deficit. Coordination normal.  Skin: Skin is warm and dry.  Psychiatric: She has a normal mood and affect. Her behavior is normal. Judgment and thought content normal.       Assessment & Plan:

## 2013-03-21 NOTE — Telephone Encounter (Signed)
Patient Information:  Caller Name: Abrina  Phone: [phone]  Patient: [name]  Gender: Female  DOB: 1926-08-29  Age: 79 Years  PCP: Deborra Medina (Adults only)  Office Follow Up:  Does the office need to follow up with this patient?: No  Instructions For The Office: N/A  RN Note:  c/o bloating with some loose stools, and low abdominal pain.  Pain is crampy, but "never really goes away."  Per abdominal pain protocol, advised appt today in office; appt scheduled 03/21/13 1600 with Raquel Rey.  krs/can  Symptoms  Reason For Call & Symptoms: flare up of diverticulitis  Reviewed Health History In EMR: Yes  Reviewed Medications In EMR: Yes  Reviewed Allergies In EMR: Yes  Reviewed Surgeries / Procedures: Yes  Date of Onset of Symptoms: 03/15/2013  Guideline(s) Used:  Abdominal Pain - Female  Disposition Per Guideline:   See Today in Office  Reason For Disposition Reached:   Moderate or mild pain that comes and goes (cramps) lasts > 24 hours  Advice Given:  N/A  Patient Will Follow Care Advice:  YES  Appointment Scheduled:  03/21/2013 16:00:00 Appointment Scheduled Provider:  Charolette Forward

## 2013-03-23 ENCOUNTER — Encounter: Payer: Self-pay | Admitting: Adult Health

## 2013-03-23 DIAGNOSIS — R1032 Left lower quadrant pain: Secondary | ICD-10-CM | POA: Insufficient documentation

## 2013-03-23 NOTE — Assessment & Plan Note (Addendum)
Patient with pain in the left lower corner and for one week. She is afebrile. History of diverticulitis and patient reports same symptoms as when she's had a flare in the past. Start Cipro 500 mg every 12 hours x 7 days. Note, patient dosed appropriately for creatinine clearance 41.14. Continue fluids to maintain hydration. Follow a bland diet such as potato, sweet potato, rice, grilled chicken, soups that are not cream based and nothing spicy.  RTC if symptoms are not improved by Monday. Also, patient was instructed to go to the emergency room if symptoms worsen, she develops a fever or excruciating pain or notices blood in her stool. Note, greater than 30 minutes were spent in face to face communication in the assessment, evaluation, planning and implementation of care pertaining to this problem.

## 2013-03-23 NOTE — Addendum Note (Signed)
Addended by: Sherryl Barters on: 03/23/2013 12:53 PM   Modules accepted: Level of Service

## 2013-03-25 ENCOUNTER — Encounter: Payer: Self-pay | Admitting: *Deleted

## 2013-04-19 ENCOUNTER — Encounter: Payer: Self-pay | Admitting: Adult Health

## 2013-04-19 ENCOUNTER — Ambulatory Visit (INDEPENDENT_AMBULATORY_CARE_PROVIDER_SITE_OTHER): Payer: Medicare Other | Admitting: Adult Health

## 2013-04-19 VITALS — BP 138/60 | HR 57 | Temp 98.2°F | Resp 12 | Wt 129.0 lb

## 2013-04-19 DIAGNOSIS — Z79899 Other long term (current) drug therapy: Secondary | ICD-10-CM | POA: Diagnosis not present

## 2013-04-19 DIAGNOSIS — R1032 Left lower quadrant pain: Secondary | ICD-10-CM

## 2013-04-19 LAB — CBC WITH DIFFERENTIAL/PLATELET
Basophils Absolute: 0 10*3/uL (ref 0.0–0.1)
Eosinophils Relative: 2.6 % (ref 0.0–5.0)
HCT: 39.8 % (ref 36.0–46.0)
Hemoglobin: 13.3 g/dL (ref 12.0–15.0)
Lymphocytes Relative: 14.6 % (ref 12.0–46.0)
Lymphs Abs: 1.1 10*3/uL (ref 0.7–4.0)
Monocytes Relative: 10.3 % (ref 3.0–12.0)
Neutro Abs: 5.4 10*3/uL (ref 1.4–7.7)
RDW: 12.8 % (ref 11.5–14.6)
WBC: 7.5 10*3/uL (ref 4.5–10.5)

## 2013-04-19 LAB — CREATININE, SERUM: Creatinine, Ser: 1.4 mg/dL — ABNORMAL HIGH (ref 0.4–1.2)

## 2013-04-19 MED ORDER — CIPROFLOXACIN HCL 500 MG PO TABS: ORAL_TABLET | ORAL | Status: DC

## 2013-04-19 NOTE — Assessment & Plan Note (Addendum)
Patient with pain in the left lower quadrant for less than 24 hours. She is afebrile. History of diverticulitis and patient reports same symptoms as when she's had a flare in the past. Start Cipro 500 mg every 12 hours x 7 days. Note, patient dosed appropriately for creatinine clearance 32.51 (based on Cr 1.4 and wt 129 and age) . Continue fluids to maintain hydration. Follow a clear liquid diet today and advance to bland diet such as potato, sweet potato, rice, grilled chicken, soups that are not cream based and nothing spicy as tolerated. Patient has appointment with Dr. Derrel Nip on Monday for this problem. She came in today because she is going out of town and felt she should not wait. Advised patient to keep appointment for Monday and if her symptoms are improved to cancel first thing Monday morning. Will check CBC and creatinine today.

## 2013-04-19 NOTE — Progress Notes (Signed)
  Subjective:    Patient ID: [name], female    DOB: 1926/07/30, 79 y.o.   MRN: [id number]  HPI  Patient is a pleasant 79 year old female with history of diverticulitis who presents to clinic with left lower abdominal pain that began during the night.  Patient denies having any fever or, nausea vomiting or blood in her stool. She has had abdominal distention and diarrhea. She denies eating anything that may have triggered the symptoms. Patient reports drinking plenty of fluids and tolerating oral intake well.   Current Outpatient Prescriptions on File Prior to Visit  Medication Sig Dispense Refill  . ALPRAZolam (XANAX) 0.25 MG tablet Take one tablet twice a day as needed for sleep or anxiety.  60 tablet  3  . aspirin 81 MG tablet Take 81 mg by mouth daily.        . calcium-vitamin D (SM CALCIUM 500/VITAMIN D3) 500-400 MG-UNIT per tablet Take 1 tablet by mouth daily.        . clopidogrel (PLAVIX) 75 MG tablet TAKE 1 TABLET DAILY  90 tablet  2  . losartan (COZAAR) 100 MG tablet Take 1 tablet (100 mg total) by mouth daily.  30 tablet  5  . multivitamin (THERAGRAN) per tablet Take 1 tablet by mouth daily.        Marland Kitchen NEXIUM 40 MG capsule TAKE 1 CAPSULE DAILY BEFORE BREAKFAST  90 capsule  1  . Psyllium-Calcium (METAMUCIL PLUS CALCIUM) CAPS Take 2 capsules by mouth daily as needed.       . simvastatin (ZOCOR) 20 MG tablet Take 1 tablet (20 mg total) by mouth at bedtime.  90 tablet  1  . tiotropium (SPIRIVA HANDIHALER) 18 MCG inhalation capsule Place 1 capsule (18 mcg total) into inhaler and inhale daily.  30 capsule  12   No current facility-administered medications on file prior to visit.    Review of Systems  Constitutional: Negative for fever and chills.  Respiratory: Negative.   Cardiovascular: Negative.   Gastrointestinal: Positive for abdominal pain, diarrhea and abdominal distention. Negative for nausea, vomiting and blood in stool.  Genitourinary: Negative.     BP 138/60   Pulse 57  Temp(Src) 98.2 F (36.8 C) (Oral)  Resp 12  Wt 129 lb (58.514 kg)  BMI 22.49 kg/m2  SpO2 97%    Objective:   Physical Exam  Constitutional: She is oriented to person, place, and time. She appears well-developed and well-nourished. No distress.  Abdominal: Soft. She exhibits no distension and no mass. There is tenderness. There is no rebound and no guarding.  Bowel sounds hyperactive especially in the left quadrants  Neurological: She is alert and oriented to person, place, and time.  Psychiatric: She has a normal mood and affect. Her behavior is normal. Judgment and thought content normal.          Assessment & Plan:

## 2013-04-19 NOTE — Patient Instructions (Addendum)

## 2013-04-22 ENCOUNTER — Encounter: Payer: Self-pay | Admitting: Internal Medicine

## 2013-04-22 ENCOUNTER — Ambulatory Visit (INDEPENDENT_AMBULATORY_CARE_PROVIDER_SITE_OTHER): Payer: Medicare Other | Admitting: Internal Medicine

## 2013-04-22 VITALS — BP 162/64 | HR 52 | Temp 98.2°F | Resp 14 | Wt 131.0 lb

## 2013-04-22 DIAGNOSIS — K5732 Diverticulitis of large intestine without perforation or abscess without bleeding: Secondary | ICD-10-CM

## 2013-04-22 DIAGNOSIS — N183 Chronic kidney disease, stage 3 unspecified: Secondary | ICD-10-CM

## 2013-04-22 DIAGNOSIS — I2510 Atherosclerotic heart disease of native coronary artery without angina pectoris: Secondary | ICD-10-CM | POA: Diagnosis not present

## 2013-04-22 DIAGNOSIS — I10 Essential (primary) hypertension: Secondary | ICD-10-CM | POA: Diagnosis not present

## 2013-04-22 DIAGNOSIS — K5792 Diverticulitis of intestine, part unspecified, without perforation or abscess without bleeding: Secondary | ICD-10-CM

## 2013-04-22 MED ORDER — METRONIDAZOLE 500 MG PO TABS: 500.0000 mg | ORAL_TABLET | Freq: Three times a day (TID) | ORAL | Status: DC

## 2013-04-22 NOTE — Assessment & Plan Note (Signed)
Her recent episodes have not been confirmed with CT scans. They have been mild in occurrence and have not resulted in hospitalizations or even dehydration. We discussed the natural history of diverticulitis and the risks of perforation with recurr infections. She does not surgical intervention at this time. I'm adding Flagyl for a few days to her Cipro. I've cautioned her not to drink alcohol and to stop Flagyl 24 hours prior to any intentional alcohol use. She continues to have recurrences we will strongly recommend surgical consult.

## 2013-04-22 NOTE — Progress Notes (Signed)
Patient ID: [name], female   DOB: [birth year], 79 y.o.   MRN: [id number]   Patient Active Problem List   Diagnosis Date Noted  . Abdominal pain, left lower quadrant 03/23/2013  . Edema 02/28/2013  . Routine general medical examination at a health care facility 02/07/2013  . Gait abnormality   . Sinus bradycardia   . Gastritis, chronic 12/26/2011  . Weight loss, non-intentional 12/26/2011  . COPD (chronic obstructive pulmonary disease) 12/05/2011  . Diverticulitis   . Hx of CABG   . Chronic kidney disease (CKD), stage III (moderate) 09/25/2011  . Insomnia, persistent 09/18/2011  . Hyperlipidemia   . Hypertension   . SOB (shortness of breath)   . Dizziness   . Syncopal episodes   . CAD (coronary artery disease)   . Ejection fraction   . Carotid artery disease     Subjective:  CC:   Chief Complaint  Patient presents with  . Follow-up    Diverticulitis    HPI:   Katrina Link McPhersonis a 79 y.o. female who presents fpr Follow up on signs and symptoms  concerning for diverticulitis,  She was Seen Friday by RR  And started on cipro 500 mg bid for LLQ and diarrhea.  She has a histroy fo severe diverticulosis in the sigmoid area by prior CT.  This is her second unconfirmed episode in 4 to 5 week.  In her first Episode  occurred  in early July and was treated with abx,  symptoms resolved.  Her symptoms recurred  Last Friday,  And was started on cipro last Friday evening.  The pain has greatly improved but she continues to report a feeling of suprapubic fullness without cramping.  She did follow  a clear liquid diet on Friday and Saturday and the stools became solid on Saturday.   she is leaving for a trip for 4 to on Thursday and would be back on Monday.   She has additional concerns about her kidney function and heart. I reviewed her kidney function with her including the fact that her creatinine had been stable since 2012 are  Review of records from [facility].  Her kidney  function was first addressed in June at which time I stopped her hydrochlorothiazide and advised her to refrain from using nonsteroidal anti-inflammatories. She is not using a drill or Aleve. She uses Tylenol as needed for pain.   She had a recent evaluation by Dr. Esmond Plants which included an echo cardiogram she was told that she had a leaky valve but no details were given and she is concerned about what to do about this. I reviewed the echocardiogram with her. She is in mild to moderate regurgitation tricuspid valve. She had a  normal ejection fraction. she does not retain fluid on a daily basis. She did retain fluid when her antihypertensives were changed to amlodipine and this has been discontinued for over a month.    Past Medical History  Diagnosis Date  . Carotid artery disease     Doppler January, 2012, 40-59% bilateral, followup one year  . Hyperlipidemia   . Hypertension   . SOB (shortness of breath)   . Dizziness     stable now (Oct 2011) patient tells me question of TIA, we will obtain records  . Syncopal episodes     after 18 holes of golf and increase hydrochlorothiazide, resolved   . Indigestion     3 weeks, October 2011  . CAD (coronary artery disease)  Nuclear October, 2011, not gated, no scar or ischemia  . Ejection fraction     EF normal, nuclear, 2008, no echo data as of April 19, 2011  . Rheumatic fever   . History of migraines   . Kidney stones   . GERD (gastroesophageal reflux disease)   . Diverticulitis     last colonoscopy  incomplete Jan 2011  . Hx of CABG     1999  . Gait abnormality     Patient complains of "wobbly gait", December, 2013  . Sinus bradycardia     Mild, December, 2013  . Diffuse cystic mastopathy     Past Surgical History  Procedure Laterality Date  . Breast biopsy  1970  . Esophagus stretched    . Appendectomy  1950  . Coronary artery bypass graft  1999  . Tonsillectomy  1939  . Total abdominal hysterectomy  1968    due to  metrorrhagia  . Cataract extraction Right 2009  . Cataract extraction Left 2011  . Colonoscopy  [id number]    Dr. Jamal Collin       The following portions of the patient's history were reviewed and updated as appropriate: Allergies, current medications, and problem list.    Review of Systems:   Patient denies headache, fevers, malaise, unintentional weight loss, skin rash, eye pain, sinus congestion and sinus pain, sore throat, dysphagia,  hemoptysis , cough, dyspnea, wheezing, chest pain, palpitations, orthopnea, edema,, nausea, melena, diarrhea, constipation,dysuria, hematuria, urinary  Frequency, nocturia, numbness, tingling, seizures,  Focal weakness, Loss of consciousness,  Tremor, insomnia, depression, anxiety, and suicidal ideation.      History   Social History  . Marital Status: Widowed    Spouse Name: N/A    Number of Children: 2  . Years of Education: N/A   Occupational History  .     Social History Main Topics  . Smoking status: Former Smoker -- 0.50 packs/day for 40 years    Types: Cigarettes    Quit date: 09/20/1983  . Smokeless tobacco: Never Used  . Alcohol Use: Yes     Comment: yes, social wine  . Drug Use: No  . Sexually Active: Not on file   Other Topics Concern  . Not on file   Social History Narrative  . No narrative on file    Objective:  BP 162/64  Pulse 52  Temp(Src) 98.2 F (36.8 C) (Oral)  Resp 14  Wt 131 lb (59.421 kg)  BMI 22.84 kg/m2  SpO2 98%  General appearance: alert, cooperative and appears stated age Ears: normal TM's and external ear canals both ears Throat: lips, mucosa, and tongue normal; teeth and gums normal Neck: no adenopathy, no carotid bruit, supple, symmetrical, trachea midline and thyroid not enlarged, symmetric, no tenderness/mass/nodules Back: symmetric, no curvature. ROM normal. No CVA tenderness. Lungs: clear to auscultation bilaterally Heart: regular rate and rhythm, S1, S2 normal, no murmur, click, rub or  gallop Abdomen: soft, non-tender; bowel sounds normal; no masses,  no organomegaly Pulses: 2+ and symmetric Skin: Skin color, texture, turgor normal. No rashes or lesions Lymph nodes: Cervical, supraclavicular, and axillary nodes normal.  Assessment and Plan:  Hypertension Home bps 132/61  No changes continue losartan .   CAD (coronary artery disease) No ongoing ischemia by October Myoview 2011. Patient is on an angiotensin blocker aspirin and statin.  Diverticulitis Her recent episodes have not been confirmed with CT scans. They have been mild in occurrence and have not resulted in hospitalizations or even dehydration.  We discussed the natural history of diverticulitis and the risks of perforation with recurr infections. She does not surgical intervention at this time. I'm adding Flagyl for a few days to her Cipro. I've cautioned her not to drink alcohol and to stop Flagyl 24 hours prior to any intentional alcohol use. She continues to have recurrences we will strongly recommend surgical consult.   Chronic kidney disease (CKD), stage III (moderate) Stable unprovoked by use of nonsteroidals. HCTZ was stopped in June.  A total of 30 minutes was spent with patient more than half of which was spent in counseling, reviewing records from other prviders and coordination of care.  Updated Medication List Outpatient Encounter Prescriptions as of 04/22/2013  Medication Sig Dispense Refill  . ALPRAZolam (XANAX) 0.25 MG tablet Take one tablet twice a day as needed for sleep or anxiety.  60 tablet  3  . aspirin 81 MG tablet Take 81 mg by mouth daily.        . calcium-vitamin D (SM CALCIUM 500/VITAMIN D3) 500-400 MG-UNIT per tablet Take 1 tablet by mouth daily.        . ciprofloxacin (CIPRO) 500 MG tablet Take 1 tablets twice a day for 7 days.  14 tablet  0  . clopidogrel (PLAVIX) 75 MG tablet TAKE 1 TABLET DAILY  90 tablet  2  . losartan (COZAAR) 100 MG tablet Take 1 tablet (100 mg total) by mouth  daily.  30 tablet  5  . multivitamin (THERAGRAN) per tablet Take 1 tablet by mouth daily.        Marland Kitchen NEXIUM 40 MG capsule TAKE 1 CAPSULE DAILY BEFORE BREAKFAST  90 capsule  1  . Psyllium-Calcium (METAMUCIL PLUS CALCIUM) CAPS Take 2 capsules by mouth daily as needed.       . simvastatin (ZOCOR) 20 MG tablet Take 1 tablet (20 mg total) by mouth at bedtime.  90 tablet  1  . tiotropium (SPIRIVA HANDIHALER) 18 MCG inhalation capsule Place 1 capsule (18 mcg total) into inhaler and inhale daily.  30 capsule  12  . metroNIDAZOLE (FLAGYL) 500 MG tablet Take 1 tablet (500 mg total) by mouth 3 (three) times daily.  21 tablet  0   No facility-administered encounter medications on file as of 04/22/2013.     No orders of the defined types were placed in this encounter.    No Follow-up on file.

## 2013-04-22 NOTE — Assessment & Plan Note (Signed)
Stable unprovoked by use of nonsteroidals. HCTZ was stopped in June.

## 2013-04-22 NOTE — Patient Instructions (Signed)
Continue the cipro and add flagyl for a few days  (no alcohol for 24 hours after last dose of flagyl)  If the diarrhea returns in [place],  Resume the cipro and flagyl and stop drinking any alcohol.    your kidney function is stable ,  Has not worsened since 2012. But you should avoid motrin and aleve,  Tylenol is ok  You should be taking 100 mg losartan for bp and 20 mg  Simvastatin for cholesterol     Your heart muscle is fine,  Not weak,  The tricuspid valve gets lazy with COPD,  Nothing to do about it,

## 2013-04-22 NOTE — Assessment & Plan Note (Addendum)
Home bps 132/61  No changes continue losartan .

## 2013-04-22 NOTE — Assessment & Plan Note (Signed)
No ongoing ischemia by October Myoview 2011. Patient is on an angiotensin blocker aspirin and statin.

## 2013-05-07 ENCOUNTER — Telehealth: Payer: Self-pay | Admitting: *Deleted

## 2013-05-07 NOTE — Telephone Encounter (Signed)
Refill Request  Nexium 40 mg cap   #30   Take one capsule each morning before breakfast

## 2013-05-09 MED ORDER — ESOMEPRAZOLE MAGNESIUM 40 MG PO CPDR: DELAYED_RELEASE_CAPSULE | ORAL | Status: DC

## 2013-05-09 NOTE — Telephone Encounter (Signed)
Script sent  

## 2013-05-16 ENCOUNTER — Encounter: Payer: Self-pay | Admitting: Adult Health

## 2013-05-16 ENCOUNTER — Ambulatory Visit (INDEPENDENT_AMBULATORY_CARE_PROVIDER_SITE_OTHER): Payer: Medicare Other | Admitting: Adult Health

## 2013-05-16 VITALS — BP 126/70 | HR 59 | Temp 98.1°F | Resp 12 | Wt 129.5 lb

## 2013-05-16 DIAGNOSIS — L989 Disorder of the skin and subcutaneous tissue, unspecified: Secondary | ICD-10-CM | POA: Diagnosis not present

## 2013-05-16 NOTE — Patient Instructions (Addendum)
  Appointment with Dr. Nehemiah Massed at [facility]  On September 4th at 9:00 am.

## 2013-05-16 NOTE — Assessment & Plan Note (Signed)
Lesion on left lower extremity painful when touched. ? Actinic Keratosis. Patient has appointment with Dr. Nehemiah Massed on Sept 4th at 9:00 am.

## 2013-05-16 NOTE — Progress Notes (Signed)
  Subjective:    Patient ID: [name], female    DOB: 06/23/1926, 79 y.o.   MRN: [id number]  HPI  Patient is a very pleasant 79 y/o female who presents to clinic with a painful skin lesion on left lower extremity. She reports that anything rubbing against this lesion is very painful including bedding and clothing. She first noticed it ~ 3 weeks ago. She has tried multiple times to get an appointment with her dermatologist, but has been unsuccessful.  They have provided her with an appointment at the end of October but she does not feel she can wait that long. She has called multiple times to see if they have had any cancellations but none reported. She denies any oozing from the lesion. She does not recall injuring the area. The surrounding area is not painful. She has a hx of unprotected sun exposure.    Current Outpatient Prescriptions on File Prior to Visit  Medication Sig Dispense Refill  . ALPRAZolam (XANAX) 0.25 MG tablet Take one tablet twice a day as needed for sleep or anxiety.  60 tablet  3  . aspirin 81 MG tablet Take 81 mg by mouth daily.        . calcium-vitamin D (SM CALCIUM 500/VITAMIN D3) 500-400 MG-UNIT per tablet Take 1 tablet by mouth daily.        . clopidogrel (PLAVIX) 75 MG tablet TAKE 1 TABLET DAILY  90 tablet  2  . esomeprazole (NEXIUM) 40 MG capsule TAKE 1 CAPSULE DAILY BEFORE BREAKFAST  90 capsule  1  . losartan (COZAAR) 100 MG tablet Take 1 tablet (100 mg total) by mouth daily.  30 tablet  5  . multivitamin (THERAGRAN) per tablet Take 1 tablet by mouth daily.        . Psyllium-Calcium (METAMUCIL PLUS CALCIUM) CAPS Take 2 capsules by mouth daily as needed.       . simvastatin (ZOCOR) 20 MG tablet Take 1 tablet (20 mg total) by mouth at bedtime.  90 tablet  1  . tiotropium (SPIRIVA HANDIHALER) 18 MCG inhalation capsule Place 1 capsule (18 mcg total) into inhaler and inhale daily.  30 capsule  12   No current facility-administered medications on file prior to  visit.    Review of Systems  Constitutional: Negative.   Respiratory: Negative.   Cardiovascular: Negative.   Skin:       Skin lesion left lower extremity   BP 126/70  Pulse 59  Temp(Src) 98.1 F (36.7 C) (Oral)  Resp 12  Wt 129 lb 8 oz (58.741 kg)  BMI 22.58 kg/m2  SpO2 97%    Objective:   Physical Exam  Constitutional: She is oriented to person, place, and time. She appears well-developed and well-nourished. No distress.  Cardiovascular: Normal rate and regular rhythm.   Pulmonary/Chest: Effort normal. No respiratory distress.  Neurological: She is alert and oriented to person, place, and time.  Skin:  Lesion on left lower extremity. Papule with hyperkeratotic surface. Painful to touch. No drainage noted from site. Evidence of sun damaged skin.  Psychiatric: She has a normal mood and affect. Her behavior is normal. Thought content normal.        Assessment & Plan:

## 2013-05-23 DIAGNOSIS — L821 Other seborrheic keratosis: Secondary | ICD-10-CM | POA: Diagnosis not present

## 2013-05-23 DIAGNOSIS — D485 Neoplasm of uncertain behavior of skin: Secondary | ICD-10-CM | POA: Diagnosis not present

## 2013-05-23 DIAGNOSIS — L820 Inflamed seborrheic keratosis: Secondary | ICD-10-CM | POA: Diagnosis not present

## 2013-05-23 DIAGNOSIS — C44721 Squamous cell carcinoma of skin of unspecified lower limb, including hip: Secondary | ICD-10-CM | POA: Diagnosis not present

## 2013-05-23 DIAGNOSIS — L578 Other skin changes due to chronic exposure to nonionizing radiation: Secondary | ICD-10-CM | POA: Diagnosis not present

## 2013-06-04 ENCOUNTER — Other Ambulatory Visit: Payer: Self-pay | Admitting: Internal Medicine

## 2013-06-05 DIAGNOSIS — Z23 Encounter for immunization: Secondary | ICD-10-CM | POA: Diagnosis not present

## 2013-06-12 ENCOUNTER — Other Ambulatory Visit: Payer: Self-pay | Admitting: Internal Medicine

## 2013-06-12 NOTE — Telephone Encounter (Signed)
Eprescribed.

## 2013-07-07 DIAGNOSIS — M461 Sacroiliitis, not elsewhere classified: Secondary | ICD-10-CM | POA: Diagnosis not present

## 2013-07-25 ENCOUNTER — Other Ambulatory Visit: Payer: Self-pay

## 2013-09-02 ENCOUNTER — Ambulatory Visit: Payer: Medicare Other | Admitting: Cardiovascular Disease

## 2013-09-03 DIAGNOSIS — L821 Other seborrheic keratosis: Secondary | ICD-10-CM | POA: Diagnosis not present

## 2013-09-03 DIAGNOSIS — D239 Other benign neoplasm of skin, unspecified: Secondary | ICD-10-CM | POA: Diagnosis not present

## 2013-09-03 DIAGNOSIS — Z85828 Personal history of other malignant neoplasm of skin: Secondary | ICD-10-CM | POA: Diagnosis not present

## 2013-09-03 DIAGNOSIS — L570 Actinic keratosis: Secondary | ICD-10-CM | POA: Diagnosis not present

## 2013-09-03 DIAGNOSIS — L578 Other skin changes due to chronic exposure to nonionizing radiation: Secondary | ICD-10-CM | POA: Diagnosis not present

## 2013-09-03 DIAGNOSIS — L820 Inflamed seborrheic keratosis: Secondary | ICD-10-CM | POA: Diagnosis not present

## 2013-09-05 ENCOUNTER — Ambulatory Visit: Payer: Medicare Other | Admitting: Cardiovascular Disease

## 2013-09-07 ENCOUNTER — Ambulatory Visit: Payer: Self-pay | Admitting: Internal Medicine

## 2013-09-09 ENCOUNTER — Ambulatory Visit: Payer: Medicare Other | Admitting: Cardiovascular Disease

## 2013-09-10 DIAGNOSIS — R3129 Other microscopic hematuria: Secondary | ICD-10-CM | POA: Diagnosis not present

## 2013-09-10 DIAGNOSIS — R319 Hematuria, unspecified: Secondary | ICD-10-CM | POA: Diagnosis not present

## 2013-09-10 DIAGNOSIS — N390 Urinary tract infection, site not specified: Secondary | ICD-10-CM | POA: Diagnosis not present

## 2013-09-17 ENCOUNTER — Telehealth: Payer: Self-pay | Admitting: Emergency Medicine

## 2013-09-17 ENCOUNTER — Other Ambulatory Visit (INDEPENDENT_AMBULATORY_CARE_PROVIDER_SITE_OTHER): Payer: Medicare Other

## 2013-09-17 DIAGNOSIS — N390 Urinary tract infection, site not specified: Secondary | ICD-10-CM | POA: Diagnosis not present

## 2013-09-17 LAB — URINALYSIS, ROUTINE W REFLEX MICROSCOPIC
Bilirubin Urine: NEGATIVE
Hgb urine dipstick: NEGATIVE
Ketones, ur: NEGATIVE
Nitrite: NEGATIVE
Urine Glucose: NEGATIVE
pH: 6 (ref 5.0–8.0)

## 2013-09-17 MED ORDER — SULFAMETHOXAZOLE-TRIMETHOPRIM 800-160 MG PO TABS: 1.0000 | ORAL_TABLET | Freq: Two times a day (BID) | ORAL | Status: DC

## 2013-09-17 MED ORDER — CULTURELLE DIGESTIVE HEALTH PO CAPS: 1.0000 | ORAL_CAPSULE | Freq: Every day | ORAL | Status: DC

## 2013-09-17 NOTE — Telephone Encounter (Signed)
Please find out specifics ,  i can't see any more patients today but i can repeat her urine if the urinary symptoms are the persistent.

## 2013-09-17 NOTE — Telephone Encounter (Signed)
Pt calling in wanting to be seen. She has a kidney infection over the holiday, she is not feeling any better after the medication. Patient would like to be seen today if possible. Please advise.

## 2013-09-17 NOTE — Telephone Encounter (Signed)
Spoke with pt, seen in [facility] last week for dysuria, urinary odor. Treated with Cipro 250 mg bid x 3 days. Completed this 3 days ago. States symptoms have improved but not completely resolved. Advised to come to office for urine sample and that we would notify her of results.

## 2013-09-17 NOTE — Addendum Note (Signed)
Addended by: [name] on: 09/17/2013 03:15 PM   Modules accepted: Orders

## 2013-09-20 DIAGNOSIS — Z23 Encounter for immunization: Secondary | ICD-10-CM | POA: Diagnosis not present

## 2013-09-23 ENCOUNTER — Other Ambulatory Visit: Payer: Self-pay | Admitting: Internal Medicine

## 2013-09-24 NOTE — Telephone Encounter (Signed)
Prescription called to [facility]

## 2013-09-24 NOTE — Telephone Encounter (Signed)
Ok to refill,  printed rx for alprazolam.

## 2013-09-26 ENCOUNTER — Ambulatory Visit (INDEPENDENT_AMBULATORY_CARE_PROVIDER_SITE_OTHER): Payer: Medicare Other | Admitting: Cardiovascular Disease

## 2013-09-26 ENCOUNTER — Encounter (INDEPENDENT_AMBULATORY_CARE_PROVIDER_SITE_OTHER): Payer: Medicare Other

## 2013-09-26 ENCOUNTER — Encounter: Payer: Self-pay | Admitting: Cardiovascular Disease

## 2013-09-26 VITALS — BP 110/52 | HR 64 | Ht 63.5 in | Wt 127.5 lb

## 2013-09-26 DIAGNOSIS — E785 Hyperlipidemia, unspecified: Secondary | ICD-10-CM | POA: Diagnosis not present

## 2013-09-26 DIAGNOSIS — R0989 Other specified symptoms and signs involving the circulatory and respiratory systems: Secondary | ICD-10-CM

## 2013-09-26 DIAGNOSIS — I10 Essential (primary) hypertension: Secondary | ICD-10-CM

## 2013-09-26 DIAGNOSIS — I6529 Occlusion and stenosis of unspecified carotid artery: Secondary | ICD-10-CM

## 2013-09-26 DIAGNOSIS — I2510 Atherosclerotic heart disease of native coronary artery without angina pectoris: Secondary | ICD-10-CM | POA: Diagnosis not present

## 2013-09-26 DIAGNOSIS — I739 Peripheral vascular disease, unspecified: Secondary | ICD-10-CM

## 2013-09-26 DIAGNOSIS — I779 Disorder of arteries and arterioles, unspecified: Secondary | ICD-10-CM

## 2013-09-26 MED ORDER — SIMVASTATIN 40 MG PO TABS: 40.0000 mg | ORAL_TABLET | Freq: Every day | ORAL | Status: DC

## 2013-09-26 NOTE — Assessment & Plan Note (Signed)
Currently with no symptoms of angina. No further workup at this time. Continue current medication regimen. 

## 2013-09-26 NOTE — Assessment & Plan Note (Signed)
Blood pressure is well controlled on today's visit. No changes made to the medications. 

## 2013-09-26 NOTE — Progress Notes (Signed)
Patient ID: [name], female    DOB: 1926-01-15, 80 y.o.   MRN: [id number]  HPI Comments: Ms. Ketchem is a pleasant 80 year old woman with a history of CAD, bypass surgery in 1999, long history of smoking for 40 years who quit in 1985, COPD, chronic renal insufficiency, hypertension, hyperlipidemia, 40-50% bilateral carotid arterial disease who presents for routine followup  She has not had any intervention since her bypass surgery. No cardiac catheterizations, no stent placement. Overall she states that she is doing well. She exercises on a treadmill 3 days per week. She denies any significant chest pain or shortness of breath. No significant lower extremity edema. Previous anginal symptoms prior to bypass surgery was shortness of breath with exertion. She denies having any significant symptoms Recent LDL 83  EKG shows normal sinus rhythm with rate 64 beats per minute with T wave abnormality through the anterior precordial leads   Past Medical History . Carotid artery disease    Doppler   40-59% bilateral . Hyperlipidemia  . Hypertension  . SOB (shortness of breath)  . Dizziness    stable now (Oct 2011) patient tells me question of TIA, we will obtain records . Syncopal episodes    after 18 holes of golf and increase hydrochlorothiazide, resolved  . CAD (coronary artery disease)  Nuclear October, 2011, not gated, no scar or ischemia . Ejection fraction EF normal, nuclear, 2008, no echo data as of April 19, 2011 . Rheumatic fever  . History of migraines  . Kidney stones  . GERD (gastroesophageal reflux disease)  . Diverticulitis last colonoscopy  incomplete Jan 2011 . Hx of CABG 1999     Outpatient Encounter Prescriptions as of 09/26/2013  Medication Sig  . ALPRAZolam (XANAX) 0.25 MG tablet TAKE 1 TABLET TWICE DAILY AS NEEDED FOR ANXIETY OR SLEEP  . aspirin 81 MG tablet Take 81 mg by mouth daily.    . calcium-vitamin D (SM CALCIUM 500/VITAMIN D3) 500-400 MG-UNIT per  tablet Take 1 tablet by mouth daily.    . clopidogrel (PLAVIX) 75 MG tablet TAKE 1 TABLET DAILY  . esomeprazole (NEXIUM) 40 MG capsule TAKE 1 CAPSULE DAILY BEFORE BREAKFAST  . losartan (COZAAR) 100 MG tablet TAKE 1 TABLET EVERY DAY  . multivitamin (THERAGRAN) per tablet Take 1 tablet by mouth daily.    . Psyllium-Calcium (METAMUCIL PLUS CALCIUM) CAPS Take 2 capsules by mouth daily as needed.   . simvastatin (ZOCOR) 20 MG tablet Take 1 tablet (20 mg total) by mouth at bedtime.  Marland Kitchen SPIRIVA HANDIHALER 18 MCG inhalation capsule INHALE THE CONTENTS OF 1 CAPSULE (VIA INHALER) ONCE DAILY  . [DISCONTINUED] clopidogrel (PLAVIX) 75 MG tablet TAKE 1 TABLET EVERY DAY  . [DISCONTINUED] Lactobacillus-Inulin ([place]) CAPS Take 1 capsule by mouth daily.  . [DISCONTINUED] sulfamethoxazole-trimethoprim (SEPTRA DS) 800-160 MG per tablet Take 1 tablet by mouth 2 (two) times daily.    Review of Systems  Constitutional: Negative.   HENT: Negative.   Eyes: Negative.   Respiratory: Negative.   Cardiovascular: Negative.   Gastrointestinal: Negative.   Endocrine: Negative.   Musculoskeletal: Negative.   Skin: Negative.   Allergic/Immunologic: Negative.   Neurological: Negative.   Hematological: Negative.   Psychiatric/Behavioral: Negative.   All other systems reviewed and are negative.    BP 110/52  Pulse 64  Ht 5' 3.5" (1.613 m)  Wt 127 lb 8 oz (57.834 kg)  BMI 22.23 kg/m2  Physical Exam  Nursing note and vitals reviewed. Constitutional: She is oriented  to person, place, and time. She appears well-developed and well-nourished.  HENT:  Head: Normocephalic.  Nose: Nose normal.  Mouth/Throat: Oropharynx is clear and moist.  Eyes: Conjunctivae are normal. Pupils are equal, round, and reactive to light.  Neck: Normal range of motion. Neck supple. No JVD present.  Cardiovascular: Normal rate, regular rhythm, S1 normal, S2 normal, normal heart sounds and intact distal pulses.  Exam  reveals no gallop and no friction rub.   No murmur heard. Pulmonary/Chest: Effort normal and breath sounds normal. No respiratory distress. She has no wheezes. She has no rales. She exhibits no tenderness.  Abdominal: Soft. Bowel sounds are normal. She exhibits no distension. There is no tenderness.  Musculoskeletal: Normal range of motion. She exhibits no edema and no tenderness.  Lymphadenopathy:    She has no cervical adenopathy.  Neurological: She is alert and oriented to person, place, and time. Coordination normal.  Skin: Skin is warm and dry. No rash noted. No erythema.  Psychiatric: She has a normal mood and affect. Her behavior is normal. Judgment and thought content normal.    Assessment and Plan

## 2013-09-26 NOTE — Assessment & Plan Note (Addendum)
Consider repeat ultrasound every 2 years, possibly later this year

## 2013-09-26 NOTE — Patient Instructions (Signed)
You are doing well. Please increase the simvastatin to 40 mg daily  Please call us if you have new issues that need to be addressed before your next appt.  Your physician wants you to follow-up in: 6 months.  You will receive a reminder letter in the mail two months in advance. If you don't receive a letter, please call our office to schedule the follow-up appointment.

## 2013-09-26 NOTE — Assessment & Plan Note (Signed)
LDL above goal. Will increase her simvastatin to 40 mg daily with recheck of lipids in 3 months

## 2013-09-27 ENCOUNTER — Ambulatory Visit (INDEPENDENT_AMBULATORY_CARE_PROVIDER_SITE_OTHER): Payer: Medicare Other | Admitting: Internal Medicine

## 2013-09-27 ENCOUNTER — Telehealth: Payer: Self-pay | Admitting: Internal Medicine

## 2013-09-27 DIAGNOSIS — N390 Urinary tract infection, site not specified: Secondary | ICD-10-CM | POA: Diagnosis not present

## 2013-09-27 LAB — POCT URINALYSIS DIPSTICK
BILIRUBIN UA: NEGATIVE
Blood, UA: NEGATIVE
Glucose, UA: NEGATIVE
Ketones, UA: NEGATIVE
Nitrite, UA: NEGATIVE
Protein, UA: NEGATIVE
SPEC GRAV UA: 1.02
Urobilinogen, UA: 0.2
pH, UA: 6

## 2013-09-27 MED ORDER — CIPROFLOXACIN HCL 250 MG PO TABS: 250.0000 mg | ORAL_TABLET | Freq: Two times a day (BID) | ORAL | Status: DC

## 2013-09-27 NOTE — Telephone Encounter (Signed)
The patient thinks she has a UTI and wants to be seen today . I don't have any available appointment. Please advise.

## 2013-09-27 NOTE — Telephone Encounter (Signed)
Patient to bring in urine and on aftrnoon schedule.

## 2013-09-29 ENCOUNTER — Encounter: Payer: Self-pay | Admitting: Internal Medicine

## 2013-09-29 NOTE — Assessment & Plan Note (Signed)
Empiric treatment with 3 dasy course of cipro sent to pharmacy for dysuria and abnormal UA.  Culture will be reviewed and medicatio nchanged if indicated.

## 2013-09-29 NOTE — Progress Notes (Signed)
Patient ID: [name], female   DOB: 23-May-1926, 80 y.o.   MRN: [id number]  Patient Active Problem List   Diagnosis Date Noted  . UTI (urinary tract infection) 09/27/2013  . Skin lesion 05/16/2013  . Abdominal pain, left lower quadrant 03/23/2013  . Routine general medical examination at a health care facility 02/07/2013  . Gait abnormality   . Sinus bradycardia   . Gastritis, chronic 12/26/2011  . Weight loss, non-intentional 12/26/2011  . COPD (chronic obstructive pulmonary disease) 12/05/2011  . Diverticulitis   . Hx of CABG   . Chronic kidney disease (CKD), stage III (moderate) 09/25/2011  . Insomnia, persistent 09/18/2011  . Hyperlipidemia   . Hypertension   . SOB (shortness of breath)   . Dizziness   . Syncopal episodes   . CAD (coronary artery disease)   . Ejection fraction   . Carotid artery disease     Subjective:  CC:   No chief complaint on file.   HPI:   Katrina Rovito McPhersonis a 80 y.o. female who presents  Past Medical History  Diagnosis Date  . Carotid artery disease     Doppler January, 2012, 40-59% bilateral, followup one year  . Hyperlipidemia   . Hypertension   . SOB (shortness of breath)   . Dizziness     stable now (Oct 2011) patient tells me question of TIA, we will obtain records  . Syncopal episodes     after 18 holes of golf and increase hydrochlorothiazide, resolved   . Indigestion     3 weeks, October 2011  . CAD (coronary artery disease)     Nuclear October, 2011, not gated, no scar or ischemia  . Ejection fraction     EF normal, nuclear, 2008, no echo data as of April 19, 2011  . Rheumatic fever   . History of migraines   . Kidney stones   . GERD (gastroesophageal reflux disease)   . Diverticulitis     last colonoscopy  incomplete Jan 2011  . Hx of CABG     1999  . Gait abnormality     Patient complains of "wobbly gait", December, 2013  . Sinus bradycardia     Mild, December, 2013  . Diffuse cystic mastopathy      Past Surgical History  Procedure Laterality Date  . Breast biopsy  1970  . Esophagus stretched    . Appendectomy  1950  . Coronary artery bypass graft  1999  . Tonsillectomy  1939  . Total abdominal hysterectomy  1968    due to metrorrhagia  . Cataract extraction Right 2009  . Cataract extraction Left 2011  . Colonoscopy  [id number]    Dr. Jamal Collin       The following portions of the patient's history were reviewed and updated as appropriate: Allergies, current medications, and problem list.    Review of Systems:   12 Pt  review of systems was negative except those addressed in the HPI,     History   Social History  . Marital Status: Widowed    Spouse Name: N/A    Number of Children: 2  . Years of Education: N/A   Occupational History  .     Social History Main Topics  . Smoking status: Former Smoker -- 0.50 packs/day for 40 years    Types: Cigarettes    Quit date: 09/20/1983  . Smokeless tobacco: Never Used  . Alcohol Use: Yes     Comment: yes, social wine  .  Drug Use: No  . Sexual Activity: Not on file   Other Topics Concern  . Not on file   Social History Narrative  . No narrative on file    Objective:  There were no vitals filed for this visit.   General appearance: alert, cooperative and appears stated age   Assessment and Plan:  UTI (urinary tract infection) Empiric treatment with 3 dasy course of cipro sent to pharmacy for dysuria and abnormal UA.  Culture will be reviewed and medicatio nchanged if indicated.     Updated Medication List Outpatient Encounter Prescriptions as of 09/27/2013  Medication Sig  . ALPRAZolam (XANAX) 0.25 MG tablet TAKE 1 TABLET TWICE DAILY AS NEEDED FOR ANXIETY OR SLEEP  . aspirin 81 MG tablet Take 81 mg by mouth daily.    . calcium-vitamin D (SM CALCIUM 500/VITAMIN D3) 500-400 MG-UNIT per tablet Take 1 tablet by mouth daily.    . ciprofloxacin (CIPRO) 250 MG tablet Take 1 tablet (250 mg total) by mouth 2  (two) times daily.  . clopidogrel (PLAVIX) 75 MG tablet TAKE 1 TABLET DAILY  . esomeprazole (NEXIUM) 40 MG capsule TAKE 1 CAPSULE DAILY BEFORE BREAKFAST  . losartan (COZAAR) 100 MG tablet TAKE 1 TABLET EVERY DAY  . multivitamin (THERAGRAN) per tablet Take 1 tablet by mouth daily.    . Psyllium-Calcium (METAMUCIL PLUS CALCIUM) CAPS Take 2 capsules by mouth daily as needed.   . simvastatin (ZOCOR) 40 MG tablet Take 1 tablet (40 mg total) by mouth at bedtime.  Marland Kitchen SPIRIVA HANDIHALER 18 MCG inhalation capsule INHALE THE CONTENTS OF 1 CAPSULE (VIA INHALER) ONCE DAILY     Orders Placed This Encounter  Procedures  . Urine culture  . POCT urinalysis dipstick    No Follow-up on file.

## 2013-09-30 ENCOUNTER — Telehealth: Payer: Self-pay | Admitting: Internal Medicine

## 2013-09-30 LAB — URINE CULTURE: Colony Count: 15000

## 2013-09-30 NOTE — Telephone Encounter (Signed)
Patient is going out of town and was notified she needed appointment to check for UTI, patient declined.

## 2013-09-30 NOTE — Telephone Encounter (Signed)
The patient called wants her urine rechecked to make sure she does not have a UTI before she goes out of town.

## 2013-09-30 NOTE — Telephone Encounter (Signed)
Not without an appt.  She dropped off a urine last time instead of being seen so I treated her empirically.  The urine was negative for UTI so if she is still having sympotms she needs to make an appt

## 2013-09-30 NOTE — Telephone Encounter (Signed)
Patient is without symptoms but is concerned because she is going out of town for 10 days, and would like to make sure UTI is resolved. Please advise

## 2013-10-02 ENCOUNTER — Ambulatory Visit (INDEPENDENT_AMBULATORY_CARE_PROVIDER_SITE_OTHER): Payer: Medicare Other | Admitting: Internal Medicine

## 2013-10-02 ENCOUNTER — Ambulatory Visit: Payer: Medicare Other | Admitting: Adult Health

## 2013-10-02 VITALS — BP 162/78 | HR 68 | Temp 98.4°F | Resp 14

## 2013-10-02 DIAGNOSIS — M545 Low back pain, unspecified: Secondary | ICD-10-CM

## 2013-10-02 DIAGNOSIS — N390 Urinary tract infection, site not specified: Secondary | ICD-10-CM

## 2013-10-02 LAB — POCT URINALYSIS DIPSTICK
Bilirubin, UA: NEGATIVE
Blood, UA: NEGATIVE
Glucose, UA: NEGATIVE
KETONES UA: NEGATIVE
Nitrite, UA: NEGATIVE
PH UA: 5
PROTEIN UA: NEGATIVE
Spec Grav, UA: 1.02
UROBILINOGEN UA: 0.2

## 2013-10-02 MED ORDER — AMLODIPINE BESYLATE 2.5 MG PO TABS: 2.5000 mg | ORAL_TABLET | Freq: Every day | ORAL | Status: DC

## 2013-10-02 NOTE — Progress Notes (Signed)
Pre-visit discussion using our clinic review tool. No additional management support is needed unless otherwise documented below in the visit note.  

## 2013-10-02 NOTE — Progress Notes (Signed)
Patient ID: BERTILLA SHATTLES, female   DOB: 09-Sep-1926, 80 y.o.   MRN: [id number] Patient Active Problem List   Diagnosis Date Noted  . Lumbago 10/04/2013  . UTI (urinary tract infection) 09/27/2013  . Skin lesion 05/16/2013  . Abdominal pain, left lower quadrant 03/23/2013  . Routine general medical examination at a health care facility 02/07/2013  . Gait abnormality   . Sinus bradycardia   . Gastritis, chronic 12/26/2011  . Weight loss, non-intentional 12/26/2011  . COPD (chronic obstructive pulmonary disease) 12/05/2011  . Diverticulitis   . Hx of CABG   . Chronic kidney disease (CKD), stage III (moderate) 09/25/2011  . Insomnia, persistent 09/18/2011  . Hyperlipidemia   . Hypertension   . SOB (shortness of breath)   . Dizziness   . Syncopal episodes   . CAD (coronary artery disease)   . Ejection fraction   . Carotid artery disease     Subjective:  CC:   Chief Complaint  Patient presents with  . Acute Visit    having back pain wants to make sure nort uti.    HPI:   Yeraldine Alon McPhersonis a 80 y.o. female who presents with  Back pain. Was treated for a UTI on Dec 24th by Urgent care with septra  Ds.   Symptoms were strong odor and development of hematuria.  Symptoms resolved with treatment.  Last week she  developed low back pain last week and dropped off a urine.  She was treated empirically with cipro but  Culture was negative .  She returns today because she is leaving for [place] and wants to confirm no UTI.  She is having no symptoms other than low back pain which is non radiating and not associated with flank pain or CVA tenderness.  She did move a lot of boxes and furniture last week..     Past Medical History  Diagnosis Date  . Carotid artery disease     Doppler January, 2012, 40-59% bilateral, followup one year  . Hyperlipidemia   . Hypertension   . SOB (shortness of breath)   . Dizziness     stable now (Oct 2011) patient tells me question of TIA, we will  obtain records  . Syncopal episodes     after 18 holes of golf and increase hydrochlorothiazide, resolved   . Indigestion     3 weeks, October 2011  . CAD (coronary artery disease)     Nuclear October, 2011, not gated, no scar or ischemia  . Ejection fraction     EF normal, nuclear, 2008, no echo data as of April 19, 2011  . Rheumatic fever   . History of migraines   . Kidney stones   . GERD (gastroesophageal reflux disease)   . Diverticulitis     last colonoscopy  incomplete Jan 2011  . Hx of CABG     1999  . Gait abnormality     Patient complains of "wobbly gait", December, 2013  . Sinus bradycardia     Mild, December, 2013  . Diffuse cystic mastopathy     Past Surgical History  Procedure Laterality Date  . Breast biopsy  1970  . Esophagus stretched    . Appendectomy  1950  . Coronary artery bypass graft  1999  . Tonsillectomy  1939  . Total abdominal hysterectomy  1968    due to metrorrhagia  . Cataract extraction Right 2009  . Cataract extraction Left 2011  . Colonoscopy  [id number]  Dr. Jamal Collin       The following portions of the patient's history were reviewed and updated as appropriate: Allergies, current medications, and problem list.    Review of Systems:   12 Pt  review of systems was negative except those addressed in the HPI,     History   Social History  . Marital Status: Widowed    Spouse Name: N/A    Number of Children: 2  . Years of Education: N/A   Occupational History  .     Social History Main Topics  . Smoking status: Former Smoker -- 0.50 packs/day for 40 years    Types: Cigarettes    Quit date: 09/20/1983  . Smokeless tobacco: Never Used  . Alcohol Use: Yes     Comment: yes, social wine  . Drug Use: No  . Sexual Activity: Not on file   Other Topics Concern  . Not on file   Social History Narrative  . No narrative on file    Objective:  Filed Vitals:   10/02/13 0924  BP: 162/78  Pulse: 68  Temp: 98.4 F (36.9  C)  Resp: 14     General appearance: alert, cooperative and appears stated age Ears: normal TM's and external ear canals both ears Throat: lips, mucosa, and tongue normal; teeth and gums normal Neck: no adenopathy, no carotid bruit, supple, symmetrical, trachea midline and thyroid not enlarged, symmetric, no tenderness/mass/nodules Back: symmetric, no curvature. ROM normal. No CVA tenderness. Lungs: clear to auscultation bilaterally Heart: regular rate and rhythm, S1, S2 normal, no murmur, click, rub or gallop Abdomen: soft, non-tender; bowel sounds normal; no masses,  no organomegaly Pulses: 2+ and symmetric Skin: Skin color, texture, turgor normal. No rashes or lesions Lymph nodes: Cervical, supraclavicular, and axillary nodes normal.  Assessment and Plan:  Lumbago Consistent with MSK strain superimposed on OA  UTI (urinary tract infection) Confirmed to patinet that she did not have a UTI last week,  Reassurance with repeat UA and culture today   Updated Medication List Outpatient Encounter Prescriptions as of 10/02/2013  Medication Sig  . ALPRAZolam (XANAX) 0.25 MG tablet TAKE 1 TABLET TWICE DAILY AS NEEDED FOR ANXIETY OR SLEEP  . aspirin 81 MG tablet Take 81 mg by mouth daily.    . calcium-vitamin D (SM CALCIUM 500/VITAMIN D3) 500-400 MG-UNIT per tablet Take 1 tablet by mouth daily.    . clopidogrel (PLAVIX) 75 MG tablet TAKE 1 TABLET DAILY  . esomeprazole (NEXIUM) 40 MG capsule TAKE 1 CAPSULE DAILY BEFORE BREAKFAST  . losartan (COZAAR) 100 MG tablet TAKE 1 TABLET EVERY DAY  . multivitamin (THERAGRAN) per tablet Take 1 tablet by mouth daily.    . Psyllium-Calcium (METAMUCIL PLUS CALCIUM) CAPS Take 2 capsules by mouth daily as needed.   . simvastatin (ZOCOR) 40 MG tablet Take 1 tablet (40 mg total) by mouth at bedtime.  Marland Kitchen SPIRIVA HANDIHALER 18 MCG inhalation capsule INHALE THE CONTENTS OF 1 CAPSULE (VIA INHALER) ONCE DAILY  . amLODipine (NORVASC) 2.5 MG tablet Take 1 tablet  (2.5 mg total) by mouth daily.  . ciprofloxacin (CIPRO) 250 MG tablet Take 1 tablet (250 mg total) by mouth 2 (two) times daily.     Orders Placed This Encounter  Procedures  . Urine culture  . Urine Culture  . POCT urinalysis dipstick    No Follow-up on file.

## 2013-10-02 NOTE — Patient Instructions (Signed)
You have no signs of a urinary tract infection by today's sample ,  But we will send it again for culture.  Your blood pressure is very elevated today  On losartan alone.  You can take 2. 5 mg amlodipine for persistnet BP > 140/90 ( I sent it to your pharmacy)  Please continue the probiotic for two more weeks since you have had 2 rounds of antibiotics in the past 30 days to prevent a serious antibiotic associated diarrhea  Called clostirudium dificile colitis and a vaginal yeast infection

## 2013-10-03 LAB — URINE CULTURE
Colony Count: NO GROWTH
ORGANISM ID, BACTERIA: NO GROWTH

## 2013-10-04 DIAGNOSIS — M545 Low back pain, unspecified: Secondary | ICD-10-CM | POA: Insufficient documentation

## 2013-10-04 NOTE — Assessment & Plan Note (Signed)
Consistent with MSK strain superimposed on OA

## 2013-10-04 NOTE — Assessment & Plan Note (Signed)
Confirmed to patinet that she did not have a UTI last week,  Reassurance with repeat UA and culture today

## 2013-10-09 ENCOUNTER — Other Ambulatory Visit: Payer: Self-pay

## 2013-10-09 DIAGNOSIS — I2510 Atherosclerotic heart disease of native coronary artery without angina pectoris: Secondary | ICD-10-CM

## 2013-10-12 ENCOUNTER — Inpatient Hospital Stay: Payer: Self-pay | Admitting: Internal Medicine

## 2013-10-12 DIAGNOSIS — R9431 Abnormal electrocardiogram [ECG] [EKG]: Secondary | ICD-10-CM | POA: Diagnosis not present

## 2013-10-12 DIAGNOSIS — R197 Diarrhea, unspecified: Secondary | ICD-10-CM | POA: Diagnosis not present

## 2013-10-12 DIAGNOSIS — E860 Dehydration: Secondary | ICD-10-CM | POA: Diagnosis not present

## 2013-10-12 DIAGNOSIS — R1110 Vomiting, unspecified: Secondary | ICD-10-CM | POA: Diagnosis not present

## 2013-10-12 DIAGNOSIS — R1013 Epigastric pain: Secondary | ICD-10-CM | POA: Diagnosis not present

## 2013-10-12 DIAGNOSIS — R1115 Cyclical vomiting syndrome unrelated to migraine: Secondary | ICD-10-CM | POA: Diagnosis not present

## 2013-10-12 DIAGNOSIS — K859 Acute pancreatitis without necrosis or infection, unspecified: Secondary | ICD-10-CM | POA: Diagnosis not present

## 2013-10-12 DIAGNOSIS — I2510 Atherosclerotic heart disease of native coronary artery without angina pectoris: Secondary | ICD-10-CM | POA: Diagnosis not present

## 2013-10-12 LAB — CBC
HCT: 41.3 % (ref 35.0–47.0)
HGB: 13.8 g/dL (ref 12.0–16.0)
MCH: 32.6 pg (ref 26.0–34.0)
MCHC: 33.3 g/dL (ref 32.0–36.0)
MCV: 98 fL (ref 80–100)
Platelet: 160 10*3/uL (ref 150–440)
RBC: 4.23 10*6/uL (ref 3.80–5.20)
RDW: 12.7 % (ref 11.5–14.5)
WBC: 10 10*3/uL (ref 3.6–11.0)

## 2013-10-12 LAB — COMPREHENSIVE METABOLIC PANEL
ALBUMIN: 4.1 g/dL (ref 3.4–5.0)
AST: 28 U/L (ref 15–37)
Alkaline Phosphatase: 68 U/L
Anion Gap: 5 — ABNORMAL LOW (ref 7–16)
BUN: 22 mg/dL — ABNORMAL HIGH (ref 7–18)
Bilirubin,Total: 0.6 mg/dL (ref 0.2–1.0)
CALCIUM: 8.4 mg/dL — AB (ref 8.5–10.1)
Chloride: 104 mmol/L (ref 98–107)
Co2: 27 mmol/L (ref 21–32)
Creatinine: 1.18 mg/dL (ref 0.60–1.30)
EGFR (African American): 49 — ABNORMAL LOW
EGFR (Non-African Amer.): 42 — ABNORMAL LOW
GLUCOSE: 163 mg/dL — AB (ref 65–99)
Osmolality: 279 (ref 275–301)
Potassium: 3.8 mmol/L (ref 3.5–5.1)
SGPT (ALT): 23 U/L (ref 12–78)
Sodium: 136 mmol/L (ref 136–145)
TOTAL PROTEIN: 7.1 g/dL (ref 6.4–8.2)

## 2013-10-12 LAB — URINALYSIS, COMPLETE
BACTERIA: NONE SEEN
BILIRUBIN, UR: NEGATIVE
Blood: NEGATIVE
Glucose,UR: NEGATIVE mg/dL (ref 0–75)
Leukocyte Esterase: NEGATIVE
Nitrite: NEGATIVE
Ph: 5 (ref 4.5–8.0)
Protein: NEGATIVE
Specific Gravity: 1.017 (ref 1.003–1.030)

## 2013-10-12 LAB — CK TOTAL AND CKMB (NOT AT ARMC)
CK, TOTAL: 47 U/L (ref 21–215)
CK-MB: 0.9 ng/mL (ref 0.5–3.6)

## 2013-10-12 LAB — TROPONIN I

## 2013-10-12 LAB — LIPASE, BLOOD: LIPASE: 1396 U/L — AB (ref 73–393)

## 2013-10-13 DIAGNOSIS — E860 Dehydration: Secondary | ICD-10-CM | POA: Diagnosis not present

## 2013-10-13 DIAGNOSIS — J90 Pleural effusion, not elsewhere classified: Secondary | ICD-10-CM | POA: Diagnosis not present

## 2013-10-13 DIAGNOSIS — J811 Chronic pulmonary edema: Secondary | ICD-10-CM | POA: Diagnosis not present

## 2013-10-13 DIAGNOSIS — A0472 Enterocolitis due to Clostridium difficile, not specified as recurrent: Secondary | ICD-10-CM | POA: Diagnosis not present

## 2013-10-13 DIAGNOSIS — K859 Acute pancreatitis without necrosis or infection, unspecified: Secondary | ICD-10-CM | POA: Diagnosis not present

## 2013-10-13 LAB — CBC WITH DIFFERENTIAL/PLATELET
BASOS ABS: 0 10*3/uL (ref 0.0–0.1)
Basophil %: 0.2 %
Eosinophil #: 0 10*3/uL (ref 0.0–0.7)
Eosinophil %: 0 %
HCT: 31.9 % — AB (ref 35.0–47.0)
HGB: 10.6 g/dL — AB (ref 12.0–16.0)
LYMPHS ABS: 0.2 10*3/uL — AB (ref 1.0–3.6)
Lymphocyte %: 4.5 %
MCH: 33 pg (ref 26.0–34.0)
MCHC: 33.4 g/dL (ref 32.0–36.0)
MCV: 99 fL (ref 80–100)
Monocyte #: 0.4 x10 3/mm (ref 0.2–0.9)
Monocyte %: 8.4 %
Neutrophil #: 4.5 10*3/uL (ref 1.4–6.5)
Neutrophil %: 86.9 %
Platelet: 104 10*3/uL — ABNORMAL LOW (ref 150–440)
RBC: 3.22 10*6/uL — ABNORMAL LOW (ref 3.80–5.20)
RDW: 12.8 % (ref 11.5–14.5)
WBC: 5.2 10*3/uL (ref 3.6–11.0)

## 2013-10-13 LAB — URINALYSIS, COMPLETE
Bilirubin,UR: NEGATIVE
Blood: NEGATIVE
Glucose,UR: NEGATIVE mg/dL (ref 0–75)
Ketone: NEGATIVE
Leukocyte Esterase: NEGATIVE
NITRITE: NEGATIVE
Ph: 5 (ref 4.5–8.0)
Protein: 100
SPECIFIC GRAVITY: 1.025 (ref 1.003–1.030)
Squamous Epithelial: NONE SEEN
WBC UR: 2 /HPF (ref 0–5)

## 2013-10-13 LAB — BASIC METABOLIC PANEL
Anion Gap: 6 — ABNORMAL LOW (ref 7–16)
BUN: 17 mg/dL (ref 7–18)
CREATININE: 1.38 mg/dL — AB (ref 0.60–1.30)
Calcium, Total: 7.2 mg/dL — ABNORMAL LOW (ref 8.5–10.1)
Chloride: 109 mmol/L — ABNORMAL HIGH (ref 98–107)
Co2: 25 mmol/L (ref 21–32)
EGFR (African American): 40 — ABNORMAL LOW
EGFR (Non-African Amer.): 35 — ABNORMAL LOW
GLUCOSE: 120 mg/dL — AB (ref 65–99)
Osmolality: 282 (ref 275–301)
POTASSIUM: 3.3 mmol/L — AB (ref 3.5–5.1)
SODIUM: 140 mmol/L (ref 136–145)

## 2013-10-13 LAB — LIPID PANEL
Cholesterol: 120 mg/dL (ref 0–200)
HDL: 48 mg/dL (ref 40–60)
LDL CHOLESTEROL, CALC: 56 mg/dL (ref 0–100)
TRIGLYCERIDES: 79 mg/dL (ref 0–200)
VLDL CHOLESTEROL, CALC: 16 mg/dL (ref 5–40)

## 2013-10-13 LAB — CLOSTRIDIUM DIFFICILE(ARMC)

## 2013-10-13 LAB — TSH: Thyroid Stimulating Horm: 0.602 u[IU]/mL

## 2013-10-13 LAB — MAGNESIUM: Magnesium: 1.4 mg/dL — ABNORMAL LOW

## 2013-10-13 LAB — LIPASE, BLOOD: Lipase: 109 U/L (ref 73–393)

## 2013-10-14 DIAGNOSIS — A0472 Enterocolitis due to Clostridium difficile, not specified as recurrent: Secondary | ICD-10-CM | POA: Diagnosis not present

## 2013-10-14 DIAGNOSIS — K859 Acute pancreatitis without necrosis or infection, unspecified: Secondary | ICD-10-CM | POA: Diagnosis not present

## 2013-10-14 DIAGNOSIS — E860 Dehydration: Secondary | ICD-10-CM | POA: Diagnosis not present

## 2013-10-14 LAB — CREATININE, SERUM
CREATININE: 1.07 mg/dL (ref 0.60–1.30)
EGFR (African American): 55 — ABNORMAL LOW
GFR CALC NON AF AMER: 47 — AB

## 2013-10-14 LAB — LIPID PANEL
CHOLESTEROL: 120 mg/dL (ref 0–200)
HDL: 48 mg/dL (ref 35–70)
LDL Cholesterol: 56 mg/dL
TRIGLYCERIDES: 79 mg/dL (ref 40–160)

## 2013-10-14 LAB — STOOL CULTURE

## 2013-10-14 LAB — TSH: TSH: 0.6 u[IU]/mL (ref 0.41–5.90)

## 2013-10-14 LAB — MAGNESIUM: Magnesium: 3.2 mg/dL — ABNORMAL HIGH

## 2013-10-14 LAB — CBC AND DIFFERENTIAL: HEMOGLOBIN: 10.8 g/dL — AB (ref 12.0–16.0)

## 2013-10-15 DIAGNOSIS — A0472 Enterocolitis due to Clostridium difficile, not specified as recurrent: Secondary | ICD-10-CM | POA: Diagnosis not present

## 2013-10-15 DIAGNOSIS — K859 Acute pancreatitis without necrosis or infection, unspecified: Secondary | ICD-10-CM | POA: Diagnosis not present

## 2013-10-15 DIAGNOSIS — E860 Dehydration: Secondary | ICD-10-CM | POA: Diagnosis not present

## 2013-10-15 LAB — HEMOGLOBIN: HGB: 10.4 g/dL — ABNORMAL LOW (ref 12.0–16.0)

## 2013-10-15 LAB — URINE CULTURE

## 2013-10-18 ENCOUNTER — Ambulatory Visit (INDEPENDENT_AMBULATORY_CARE_PROVIDER_SITE_OTHER): Payer: Medicare Other | Admitting: Internal Medicine

## 2013-10-18 ENCOUNTER — Encounter: Payer: Self-pay | Admitting: Internal Medicine

## 2013-10-18 VITALS — BP 142/60 | HR 55 | Temp 98.0°F | Resp 18 | Ht 63.5 in | Wt 135.0 lb

## 2013-10-18 DIAGNOSIS — R110 Nausea: Secondary | ICD-10-CM

## 2013-10-18 DIAGNOSIS — E876 Hypokalemia: Secondary | ICD-10-CM

## 2013-10-18 DIAGNOSIS — A0472 Enterocolitis due to Clostridium difficile, not specified as recurrent: Secondary | ICD-10-CM | POA: Diagnosis not present

## 2013-10-18 DIAGNOSIS — R609 Edema, unspecified: Secondary | ICD-10-CM

## 2013-10-18 DIAGNOSIS — N390 Urinary tract infection, site not specified: Secondary | ICD-10-CM

## 2013-10-18 DIAGNOSIS — E441 Mild protein-calorie malnutrition: Secondary | ICD-10-CM

## 2013-10-18 DIAGNOSIS — K859 Acute pancreatitis without necrosis or infection, unspecified: Secondary | ICD-10-CM

## 2013-10-18 LAB — CULTURE, BLOOD (SINGLE)

## 2013-10-18 MED ORDER — POTASSIUM CHLORIDE CRYS ER 10 MEQ PO TBCR: 10.0000 meq | EXTENDED_RELEASE_TABLET | Freq: Two times a day (BID) | ORAL | Status: DC

## 2013-10-18 MED ORDER — FUROSEMIDE 20 MG PO TABS: 20.0000 mg | ORAL_TABLET | Freq: Every day | ORAL | Status: DC

## 2013-10-18 NOTE — Patient Instructions (Addendum)
i AM PRESCRIBING FUROSEMIDE FOR THE FLUID RETENTION . tAKE DAILY FOR 3 DAYS ONLY {Please take 2 potassium tablets daily until I tell you to stop   Return on MOnday for labs (NO FASTING REQUIRED)  Clostridium Difficile Infection Clostridium difficile (C. difficile) is a bacteria found in the intestinal tract or colon. Under certain conditions, it causes diarrhea and sometimes severe disease. The severe form of the disease is known as pseudomembranous colitis (often called C. difficile colitis). This disease can damage the lining of the colon or cause the colon to become enlarged (toxic megacolon).  CAUSES  Your colon normally contains many different bacteria, including C. difficile. The balance of bacteria in your colon can change during illness. This is especially true when you take antibiotic medicine. Taking antibiotics may allow the C. difficile to grow, multiply excessively, and make a toxin that then causes illness. The elderly and people with certain medical conditions have a greater risk of getting C. difficile infections. SYMPTOMS   Watery diarrhea.  Fever.  Fatigue.  Loss of appetite.  Nausea.  Abdominal swelling, pain, or tenderness.  Dehydration. DIAGNOSIS  Your symptoms may make your caregiver suspicious of a C. difficile infection, especially if you have used antibiotics in the preceding weeks. However, there are only 2 ways to know for certain whether you have a C. difficile infection:  A lab test that finds the toxin in your stool.  The specific appearance of an abnormality (pseudomembrane) in your colon. This can only be seen by doing a sigmoidoscopy or colonoscopy. These procedures involve passing an instrument through your rectum to look at the inside of your colon. Your caregiver will help determine if these tests are necessary. TREATMENT   Most people are successfully treated with one of two specific antibiotics, usually given by mouth. Other antibiotics you are  receiving are stopped if possible.  Intravenous (IV) fluids and correction of electrolyte imbalance may be necessary.  Rarely, surgery may be needed to remove the infected part of the intestines.  Careful hand washing by you and your caregivers is important to prevent the spread of infection. In the hospital, your caregivers may also put on gowns and gloves to prevent the spread of the C. difficile bacteria. Your room is also cleaned regularly with a solution containing bleach or a product that is known to kill C. difficile. HOME CARE INSTRUCTIONS  Drink enough fluids to keep your urine clear or pale yellow. Avoid milk, caffeine, and alcohol.  Ask your caregiver for specific rehydration instructions.  Try eating small, frequent meals rather than large meals.  Take your antibiotics as directed. Finish them even if you start to feel better.  Do not use medicines to slow diarrhea. This could delay healing or cause complications.  Wash your hands thoroughly after using the bathroom and before preparing food.  Make sure people who live with you wash their hands often, too.  Carefully disinfect all surfaces with a product that contains chlorine bleach. SEEK MEDICAL CARE IF:  Diarrhea persists longer than expected or recurs after completing your course of antibiotic treatment for the C. difficile infection.  You have trouble staying hydrated. SEEK IMMEDIATE MEDICAL CARE IF:  You develop a new fever.  You have increasing abdominal pain or tenderness.  There is blood in your stools, or your stools are dark black and tarry.  You cannot hold down food or liquids. MAKE SURE YOU:   Understand these instructions.  Will watch your condition.  Will get help  right away if you are not doing well or get worse. Document Released: 06/15/2005 Document Revised: 12/31/2012 Document Reviewed: 02/11/2011 [facility] Patient Information 2014 [place], [place].   Diet for Diarrhea, Adult Frequent,  runny stools (diarrhea) may be caused or worsened by food or drink. Diarrhea may be relieved by changing your diet. Since diarrhea can last up to 7 days, it is easy for you to lose too much fluid from the body and become dehydrated. Fluids that are lost need to be replaced. Along with a modified diet, make sure you drink enough fluids to keep your urine clear or pale yellow. DIET INSTRUCTIONS  Ensure adequate fluid intake (hydration): have 1 cup (8 oz) of fluid for each diarrhea episode. Avoid fluids that contain simple sugars or sports drinks, fruit juices, whole milk products, and sodas. Your urine should be clear or pale yellow if you are drinking enough fluids. Hydrate with an oral rehydration solution that you can purchase at pharmacies, retail stores, and online. You can prepare an oral rehydration solution at home by mixing the following ingredients together:    tsp table salt.   tsp baking soda.   tsp salt substitute containing potassium chloride.  1  tablespoons sugar.  1 L (34 oz) of water.  Certain foods and beverages may increase the speed at which food moves through the gastrointestinal (GI) tract. These foods and beverages should be avoided and include:  Caffeinated and alcoholic beverages.  High-fiber foods, such as raw fruits and vegetables, nuts, seeds, and whole grain breads and cereals.  Foods and beverages sweetened with sugar alcohols, such as xylitol, sorbitol, and mannitol.  Some foods may be well tolerated and may help thicken stool including:  Starchy foods, such as rice, toast, pasta, low-sugar cereal, oatmeal, grits, baked potatoes, crackers, and bagels.   Bananas.   Applesauce.  Add probiotic-rich foods to help increase healthy bacteria in the GI tract, such as yogurt and fermented milk products. RECOMMENDED FOODS AND BEVERAGES Starches Choose foods with less than 2 g of fiber per serving.  Recommended:  White, [place], and pita breads, plain rolls,  buns, bagels. Plain muffins, matzo. Soda, saltine, or graham crackers. Pretzels, melba toast, zwieback. Cooked cereals made with water: cornmeal, farina, cream cereals. Dry cereals: refined corn, wheat, rice. Potatoes prepared any way without skins, refined macaroni, spaghetti, noodles, refined rice.  Avoid:  Bread, rolls, or crackers made with whole wheat, multi-grains, rye, bran seeds, nuts, or coconut. Corn tortillas or taco shells. Cereals containing whole grains, multi-grains, bran, coconut, nuts, raisins. Cooked or dry oatmeal. Coarse wheat cereals, granola. Cereals advertised as "high-fiber." Potato skins. Whole grain pasta, wild or brown rice. Popcorn. Sweet potatoes, yams. Sweet rolls, doughnuts, waffles, pancakes, sweet breads. Vegetables  Recommended: Strained tomato and vegetable juices. Most well-cooked and canned vegetables without seeds. Fresh: Tender lettuce, cucumber without the skin, cabbage, spinach, bean sprouts.  Avoid: Fresh, cooked, or canned: Artichokes, baked beans, beet greens, broccoli, Brussels sprouts, corn, kale, legumes, peas, sweet potatoes. Cooked: Green or red cabbage, spinach. Avoid large servings of any vegetables because vegetables shrink when cooked, and they contain more fiber per serving than fresh vegetables. Fruit  Recommended: Cooked or canned: Apricots, applesauce, cantaloupe, cherries, fruit cocktail, grapefruit, grapes, kiwi, mandarin oranges, peaches, pears, plums, watermelon. Fresh: Apples without skin, ripe banana, grapes, cantaloupe, cherries, grapefruit, peaches, oranges, plums. Keep servings limited to  cup or 1 piece.  Avoid: Fresh: Apples with skin, apricots, mangoes, pears, raspberries, strawberries. Prune juice, stewed or dried prunes.  Dried fruits, raisins, dates. Large servings of all fresh fruits. Protein  Recommended: Ground or well-cooked tender beef, ham, veal, lamb, pork, or poultry. Eggs. Fish, oysters, shrimp, lobster, other seafoods.  Liver, organ meats.  Avoid: Tough, fibrous meats with gristle. Peanut butter, smooth or chunky. Cheese, nuts, seeds, legumes, dried peas, beans, lentils. Dairy  Recommended: Yogurt, lactose-free milk, kefir, drinkable yogurt, buttermilk, soy milk, or plain hard cheese.  Avoid: Milk, chocolate milk, beverages made with milk, such as milkshakes. Soups  Recommended: Bouillon, broth, or soups made from allowed foods. Any strained soup.  Avoid: Soups made from vegetables that are not allowed, cream or milk-based soups. Desserts and Sweets  Recommended: Sugar-free gelatin, sugar-free frozen ice pops made without sugar alcohol.  Avoid: Plain cakes and cookies, pie made with fruit, pudding, custard, cream pie. Gelatin, fruit, ice, sherbet, frozen ice pops. Ice cream, ice milk without nuts. Plain hard candy, honey, jelly, molasses, syrup, sugar, chocolate syrup, gumdrops, marshmallows. Fats and Oils  Recommended: Limit fats to less than 8 tsp per day.  Avoid: Seeds, nuts, olives, avocados. Margarine, butter, cream, mayonnaise, salad oils, plain salad dressings. Plain gravy, crisp bacon without rind. Beverages  Recommended: Water, decaffeinated teas, oral rehydration solutions, sugar-free beverages not sweetened with sugar alcohols.  Avoid: Fruit juices, caffeinated beverages (coffee, tea, soda), alcohol, sports drinks, or lemon-lime soda. Condiments  Recommended: Ketchup, mustard, horseradish, vinegar, cocoa powder. Spices in moderation: allspice, basil, bay leaves, celery powder or leaves, cinnamon, cumin powder, curry powder, ginger, mace, marjoram, onion or garlic powder, oregano, paprika, parsley flakes, ground pepper, rosemary, sage, savory, tarragon, thyme, turmeric.  Avoid: Coconut, honey. Document Released: 11/26/2003 Document Revised: 05/30/2012 Document Reviewed: 01/20/2012 [facility] Patient Information 2014 [place].

## 2013-10-18 NOTE — Progress Notes (Signed)
Pre-visit discussion using our clinic review tool. No additional management support is needed unless otherwise documented below in the visit note.  

## 2013-10-18 NOTE — Progress Notes (Signed)
Patient ID: [name], female   DOB: 1925-11-17, 80 y.o.   MRN: [id number]  Patient Active Problem List   Diagnosis Date Noted  . Colitis due to Clostridium difficile 10/20/2013  . Lumbago 10/04/2013  . UTI (urinary tract infection) 09/27/2013  . Skin lesion 05/16/2013  . Routine general medical examination at a health care facility 02/07/2013  . Gait abnormality   . Sinus bradycardia   . Gastritis, chronic 12/26/2011  . COPD (chronic obstructive pulmonary disease) 12/05/2011  . Diverticulitis   . Hx of CABG   . Chronic kidney disease (CKD), stage III (moderate) 09/25/2011  . Insomnia, persistent 09/18/2011  . Hyperlipidemia   . Hypertension   . SOB (shortness of breath)   . Dizziness   . Syncopal episodes   . CAD (coronary artery disease)   . Ejection fraction   . Carotid artery disease     Subjective:  CC:   Chief Complaint  Patient presents with  . Follow-up    Hospital Adm    HPI:   Katrina Baugher McPhersonis a 80 y.o. female who presents for  Hospital followup. Patient was recently Admitted o Saturday Jan 24th and discharged home on Tuesday Jan 27th with flagyl.  with abdominal pain, uncontrolled vomiting watery diarrhea and found to have C. difficile colitis and pancreatitis. She was treated with IV fluids and sent home on oral Flagyl.  The cause of her pancreatitis was never determined. Fasting lipids ruled out hypertriglyceridemia. symptoms  abdominal pain started  within 24 hours after returning home from her [place] trip. She had been seen in the office prior to leaving for [place] due to a request for additional antibiotics which she was given initially for symptoms of dysuria which turned out to be not conclusive for urinary tract infection by urine culture. When she was seen last this was discussed with her at all with the risk of developing C. difficile colitis from recurrent use of antibiotics. She is concerned now about any future infections and how we will  manage them. Waking up with night sweats.  Bowels are producing formed stools.   Taking a probiotic,  Eating yogurt,  Cranberry pills.  Retaining fluid.  Appetite  slowly improving,  Feels very week but has gained weight  Le edema.   Past Medical History  Diagnosis Date  . Carotid artery disease     Doppler January, 2012, 40-59% bilateral, followup one year  . Hyperlipidemia   . Hypertension   . SOB (shortness of breath)   . Dizziness     stable now (Oct 2011) patient tells me question of TIA, we will obtain records  . Syncopal episodes     after 18 holes of golf and increase hydrochlorothiazide, resolved   . Indigestion     3 weeks, October 2011  . CAD (coronary artery disease)     Nuclear October, 2011, not gated, no scar or ischemia  . Ejection fraction     EF normal, nuclear, 2008, no echo data as of April 19, 2011  . Rheumatic fever   . History of migraines   . Kidney stones   . GERD (gastroesophageal reflux disease)   . Diverticulitis     last colonoscopy  incomplete Jan 2011  . Hx of CABG     1999  . Gait abnormality     Patient complains of "wobbly gait", December, 2013  . Sinus bradycardia     Mild, December, 2013  . Diffuse cystic mastopathy  Past Surgical History  Procedure Laterality Date  . Breast biopsy  1970  . Esophagus stretched    . Appendectomy  1950  . Coronary artery bypass graft  1999  . Tonsillectomy  1939  . Total abdominal hysterectomy  1968    due to metrorrhagia  . Cataract extraction Right 2009  . Cataract extraction Left 2011  . Colonoscopy  [id number]    Dr. Jamal Collin       The following portions of the patient's history were reviewed and updated as appropriate: Allergies, current medications, and problem list.    Review of Systems:   12 Pt  review of systems was negative except those addressed in the HPI,     History   Social History  . Marital Status: Widowed    Spouse Name: N/A    Number of Children: 2  . Years  of Education: N/A   Occupational History  .     Social History Main Topics  . Smoking status: Former Smoker -- 0.50 packs/day for 40 years    Types: Cigarettes    Quit date: 09/20/1983  . Smokeless tobacco: Never Used  . Alcohol Use: Yes     Comment: yes, social wine  . Drug Use: No  . Sexual Activity: Not on file   Other Topics Concern  . Not on file   Social History Narrative  . No narrative on file    Objective:  Filed Vitals:   10/18/13 1053  BP: 142/60  Pulse: 55  Temp: 98 F (36.7 C)  Resp: 18     General appearance: alert, cooperative and appears stated age Ears: normal TM's and external ear canals both ears Throat: lips, mucosa, and tongue normal; teeth and gums normal Neck: no adenopathy, no carotid bruit, supple, symmetrical, trachea midline and thyroid not enlarged, symmetric, no tenderness/mass/nodules Back: symmetric, no curvature. ROM normal. No CVA tenderness. Lungs: clear to auscultation bilaterally Heart: regular rate and rhythm, S1, S2 normal, no murmur, click, rub or gallop Abdomen: soft, non-tender; bowel sounds normal; no masses,  no organomegaly Pulses: 2+ and symmetric Skin: Skin color, texture, turgor poor.  1+ pitting edema bilaterally on LE's Lymph nodes: Cervical, supraclavicular, and axillary nodes normal.  Assessment and Plan:  Colitis due to Clostridium difficile Infection occurred in the setting of recent use of antibiotics for dysuria. Long talk today regarding future episodes of dysuria which will require evaluation. She is currently not having diarrhea. She is tolerating the metronidazole with some loss of appetite. She has had some fluid retention which has elicited a mild protein malnutrition. I've given her a printed handout for clear liquid diet to use during diarrhea episodes.  UTI (urinary tract infection) Recent episode of dysuria or not to be noninfectious as her urine culture was negative.  Edema Likely due to increased  water intake and decrease protein intake from recent bout of colitis. Advise increase her protein in her diet. Given prescription for when necessary Lasix not use more than 3 days.  Pancreatitis, acute Cause unclear. She was admitted on January 24 of with a lipase of 1200 accompanied by abdominal pain and uncontrolled vomiting. Triglycerides were normal.  Her discharge summary suggested alcohol abuse but no alcohol level was reported. Patient denies  drinking more than one to 2  alcohol beverages per night on a regular basis but may have been drinking more than that during her recent vacation. I warned her that abuse of alcohol can lead to recurrent pancreatitis and use of  any alcohol during use of metronidazole make her incredibly sick.   A total of 40 minutes was spent with patient more than half of which was spent in counseling, reviewing records from other prviders and coordination of care.   Updated Medication List Outpatient Encounter Prescriptions as of 10/18/2013  Medication Sig  . ALPRAZolam (XANAX) 0.25 MG tablet TAKE 1 TABLET TWICE DAILY AS NEEDED FOR ANXIETY OR SLEEP  . amLODipine (NORVASC) 2.5 MG tablet Take 1 tablet (2.5 mg total) by mouth daily.  Marland Kitchen aspirin 81 MG tablet Take 81 mg by mouth daily.    . calcium-vitamin D (SM CALCIUM 500/VITAMIN D3) 500-400 MG-UNIT per tablet Take 1 tablet by mouth daily.    . clopidogrel (PLAVIX) 75 MG tablet TAKE 1 TABLET DAILY  . esomeprazole (NEXIUM) 40 MG capsule TAKE 1 CAPSULE DAILY BEFORE BREAKFAST  . losartan (COZAAR) 100 MG tablet TAKE 1 TABLET EVERY DAY  . metroNIDAZOLE (FLAGYL) 500 MG tablet Take 500 mg by mouth 3 (three) times daily.   . multivitamin (THERAGRAN) per tablet Take 1 tablet by mouth daily.    . Psyllium-Calcium (METAMUCIL PLUS CALCIUM) CAPS Take 2 capsules by mouth daily as needed.   . simvastatin (ZOCOR) 40 MG tablet Take 1 tablet (40 mg total) by mouth at bedtime.  . furosemide (LASIX) 20 MG tablet Take 1 tablet (20 mg  total) by mouth daily.  . potassium chloride (K-DUR,KLOR-CON) 10 MEQ tablet Take 1 tablet (10 mEq total) by mouth 2 (two) times daily.  . [DISCONTINUED] ciprofloxacin (CIPRO) 250 MG tablet Take 1 tablet (250 mg total) by mouth 2 (two) times daily.  . [DISCONTINUED] SPIRIVA HANDIHALER 18 MCG inhalation capsule INHALE THE CONTENTS OF 1 CAPSULE (VIA INHALER) ONCE DAILY

## 2013-10-20 ENCOUNTER — Encounter: Payer: Self-pay | Admitting: Internal Medicine

## 2013-10-20 DIAGNOSIS — Z8719 Personal history of other diseases of the digestive system: Secondary | ICD-10-CM | POA: Insufficient documentation

## 2013-10-20 DIAGNOSIS — A0472 Enterocolitis due to Clostridium difficile, not specified as recurrent: Secondary | ICD-10-CM | POA: Insufficient documentation

## 2013-10-20 DIAGNOSIS — Z8619 Personal history of other infectious and parasitic diseases: Secondary | ICD-10-CM | POA: Insufficient documentation

## 2013-10-20 NOTE — Assessment & Plan Note (Signed)
Infection occurred in the setting of recent use of antibiotics for dysuria. Long talk today regarding future episodes of dysuria which will require evaluation. She is currently not having diarrhea. She is tolerating the metronidazole with some loss of appetite. She has had some fluid retention which has elicited a mild protein malnutrition. I've given her a printed handout for clear liquid diet to use during diarrhea episodes.

## 2013-10-20 NOTE — Assessment & Plan Note (Signed)
Recent episode of dysuria or not to be noninfectious as her urine culture was negative.

## 2013-10-20 NOTE — Assessment & Plan Note (Addendum)
Cause unclear. She was admitted on January 24 of with a lipase of 1200 accompanied by abdominal pain and uncontrolled vomiting. Triglycerides were normal.  Abdominal ultrasound was unrevealing. Her discharge summary suggested alcohol abuse but no alcohol level was reported. Patient denies  drinking more than one to 2  alcohol beverages per night on a regular basis but may have been drinking more than that during her recent vacation. I warned her that abuse of alcohol can lead to recurrent pancreatitis and use of any alcohol during use of metronidazole make her incredibly sick.

## 2013-10-20 NOTE — Assessment & Plan Note (Signed)
Likely due to increased water intake and decrease protein intake from recent bout of colitis. Advise increase her protein in her diet. Given prescription for when necessary Lasix not use more than 3 days.

## 2013-10-21 ENCOUNTER — Other Ambulatory Visit (INDEPENDENT_AMBULATORY_CARE_PROVIDER_SITE_OTHER): Payer: Medicare Other

## 2013-10-21 DIAGNOSIS — E441 Mild protein-calorie malnutrition: Secondary | ICD-10-CM | POA: Diagnosis not present

## 2013-10-21 DIAGNOSIS — E876 Hypokalemia: Secondary | ICD-10-CM

## 2013-10-21 DIAGNOSIS — R110 Nausea: Secondary | ICD-10-CM

## 2013-10-21 LAB — BASIC METABOLIC PANEL
BUN: 15 mg/dL (ref 6–23)
CALCIUM: 9.1 mg/dL (ref 8.4–10.5)
CO2: 30 mEq/L (ref 19–32)
CREATININE: 1.1 mg/dL (ref 0.4–1.2)
Chloride: 100 mEq/L (ref 96–112)
GFR: 52.34 mL/min — ABNORMAL LOW (ref >60.00)
Glucose, Bld: 108 mg/dL — ABNORMAL HIGH (ref 70–99)
Potassium: 4.4 mEq/L (ref 3.5–5.1)
SODIUM: 139 meq/L (ref 135–145)

## 2013-10-21 LAB — PREALBUMIN: Prealbumin: 21.6 mg/dL (ref 17.0–34.0)

## 2013-10-21 LAB — HEPATIC FUNCTION PANEL
ALK PHOS: 49 U/L (ref 39–117)
ALT: 34 U/L (ref 0–35)
AST: 16 U/L (ref 0–37)
Albumin: 3.3 g/dL — ABNORMAL LOW (ref 3.5–5.2)
BILIRUBIN DIRECT: 0.2 mg/dL (ref 0.0–0.3)
BILIRUBIN TOTAL: 0.9 mg/dL (ref 0.3–1.2)
Total Protein: 5.7 g/dL — ABNORMAL LOW (ref 6.0–8.3)

## 2013-10-21 LAB — MAGNESIUM: Magnesium: 1.7 mg/dL (ref 1.5–2.5)

## 2013-10-23 ENCOUNTER — Encounter: Payer: Self-pay | Admitting: Internal Medicine

## 2013-10-25 NOTE — Telephone Encounter (Signed)
Mailed unread message to pt  

## 2013-11-01 ENCOUNTER — Telehealth: Payer: Self-pay | Admitting: Internal Medicine

## 2013-11-01 ENCOUNTER — Telehealth: Payer: Self-pay | Admitting: *Deleted

## 2013-11-01 ENCOUNTER — Encounter: Payer: Self-pay | Admitting: Internal Medicine

## 2013-11-01 ENCOUNTER — Ambulatory Visit (INDEPENDENT_AMBULATORY_CARE_PROVIDER_SITE_OTHER): Payer: Medicare Other | Admitting: Internal Medicine

## 2013-11-01 VITALS — BP 126/70 | HR 93 | Temp 97.3°F | Resp 16

## 2013-11-01 DIAGNOSIS — R197 Diarrhea, unspecified: Secondary | ICD-10-CM

## 2013-11-01 DIAGNOSIS — A0472 Enterocolitis due to Clostridium difficile, not specified as recurrent: Secondary | ICD-10-CM

## 2013-11-01 DIAGNOSIS — I2510 Atherosclerotic heart disease of native coronary artery without angina pectoris: Secondary | ICD-10-CM

## 2013-11-01 MED ORDER — METRONIDAZOLE 500 MG PO TABS: 500.0000 mg | ORAL_TABLET | Freq: Three times a day (TID) | ORAL | Status: DC

## 2013-11-01 NOTE — Patient Instructions (Signed)
I am treating your fo C dificile colitis recurrence with the same medication, metronidazole  Three times daily for one week.  Return Next Tuesday or Wednesday  Diet for Diarrhea, Adult Frequent, runny stools (diarrhea) may be caused or worsened by food or drink. Diarrhea may be relieved by changing your diet. Since diarrhea can last up to 7 days, it is easy for you to lose too much fluid from the body and become dehydrated. Fluids that are lost need to be replaced. Along with a modified diet, make sure you drink enough fluids to keep your urine clear or pale yellow. DIET INSTRUCTIONS  Ensure adequate fluid intake (hydration): have 1 cup (8 oz) of fluid for each diarrhea episode. Avoid fluids that contain simple sugars or sports drinks, fruit juices, whole milk products, and sodas. Your urine should be clear or pale yellow if you are drinking enough fluids. Hydrate with an oral rehydration solution that you can purchase at pharmacies, retail stores, and online. You can prepare an oral rehydration solution at home by mixing the following ingredients together:    tsp table salt.   tsp baking soda.   tsp salt substitute containing potassium chloride.  1  tablespoons sugar.  1 L (34 oz) of water.  Certain foods and beverages may increase the speed at which food moves through the gastrointestinal (GI) tract. These foods and beverages should be avoided and include:  Caffeinated and alcoholic beverages.  High-fiber foods, such as raw fruits and vegetables, nuts, seeds, and whole grain breads and cereals.  Foods and beverages sweetened with sugar alcohols, such as xylitol, sorbitol, and mannitol.  Some foods may be well tolerated and may help thicken stool including:  Starchy foods, such as rice, toast, pasta, low-sugar cereal, oatmeal, grits, baked potatoes, crackers, and bagels.   Bananas.   Applesauce.  Add probiotic-rich foods to help increase healthy bacteria in the GI tract, such as  yogurt and fermented milk products. RECOMMENDED FOODS AND BEVERAGES Starches Choose foods with less than 2 g of fiber per serving.  Recommended:  White, [place], and pita breads, plain rolls, buns, bagels. Plain muffins, matzo. Soda, saltine, or graham crackers. Pretzels, melba toast, zwieback. Cooked cereals made with water: cornmeal, farina, cream cereals. Dry cereals: refined corn, wheat, rice. Potatoes prepared any way without skins, refined macaroni, spaghetti, noodles, refined rice.  Avoid:  Bread, rolls, or crackers made with whole wheat, multi-grains, rye, bran seeds, nuts, or coconut. Corn tortillas or taco shells. Cereals containing whole grains, multi-grains, bran, coconut, nuts, raisins. Cooked or dry oatmeal. Coarse wheat cereals, granola. Cereals advertised as "high-fiber." Potato skins. Whole grain pasta, wild or brown rice. Popcorn. Sweet potatoes, yams. Sweet rolls, doughnuts, waffles, pancakes, sweet breads. Vegetables  Recommended: Strained tomato and vegetable juices. Most well-cooked and canned vegetables without seeds. Fresh: Tender lettuce, cucumber without the skin, cabbage, spinach, bean sprouts.  Avoid: Fresh, cooked, or canned: Artichokes, baked beans, beet greens, broccoli, Brussels sprouts, corn, kale, legumes, peas, sweet potatoes. Cooked: Green or red cabbage, spinach. Avoid large servings of any vegetables because vegetables shrink when cooked, and they contain more fiber per serving than fresh vegetables. Fruit  Recommended: Cooked or canned: Apricots, applesauce, cantaloupe, cherries, fruit cocktail, grapefruit, grapes, kiwi, mandarin oranges, peaches, pears, plums, watermelon. Fresh: Apples without skin, ripe banana, grapes, cantaloupe, cherries, grapefruit, peaches, oranges, plums. Keep servings limited to  cup or 1 piece.  Avoid: Fresh: Apples with skin, apricots, mangoes, pears, raspberries, strawberries. Prune juice, stewed or dried prunes. Dried  fruits,  raisins, dates. Large servings of all fresh fruits. Protein  Recommended: Ground or well-cooked tender beef, ham, veal, lamb, pork, or poultry. Eggs. Fish, oysters, shrimp, lobster, other seafoods. Liver, organ meats.  Avoid: Tough, fibrous meats with gristle. Peanut butter, smooth or chunky. Cheese, nuts, seeds, legumes, dried peas, beans, lentils. Dairy  Recommended: Yogurt, lactose-free milk, kefir, drinkable yogurt, buttermilk, soy milk, or plain hard cheese.  Avoid: Milk, chocolate milk, beverages made with milk, such as milkshakes. Soups  Recommended: Bouillon, broth, or soups made from allowed foods. Any strained soup.  Avoid: Soups made from vegetables that are not allowed, cream or milk-based soups. Desserts and Sweets  Recommended: Sugar-free gelatin, sugar-free frozen ice pops made without sugar alcohol.  Avoid: Plain cakes and cookies, pie made with fruit, pudding, custard, cream pie. Gelatin, fruit, ice, sherbet, frozen ice pops. Ice cream, ice milk without nuts. Plain hard candy, honey, jelly, molasses, syrup, sugar, chocolate syrup, gumdrops, marshmallows. Fats and Oils  Recommended: Limit fats to less than 8 tsp per day.  Avoid: Seeds, nuts, olives, avocados. Margarine, butter, cream, mayonnaise, salad oils, plain salad dressings. Plain gravy, crisp bacon without rind. Beverages  Recommended: Water, decaffeinated teas, oral rehydration solutions, sugar-free beverages not sweetened with sugar alcohols.  Avoid: Fruit juices, caffeinated beverages (coffee, tea, soda), alcohol, sports drinks, or lemon-lime soda. Condiments  Recommended: Ketchup, mustard, horseradish, vinegar, cocoa powder. Spices in moderation: allspice, basil, bay leaves, celery powder or leaves, cinnamon, cumin powder, curry powder, ginger, mace, marjoram, onion or garlic powder, oregano, paprika, parsley flakes, ground pepper, rosemary, sage, savory, tarragon, thyme, turmeric.  Avoid: Coconut,  honey. Document Released: 11/26/2003 Document Revised: 05/30/2012 Document Reviewed: 01/20/2012 [facility] Patient Information 2014 [place].

## 2013-11-01 NOTE — Progress Notes (Signed)
Patient ID: [name], female   DOB: 1926-07-06, 80 y.o.   MRN: [id number]  Patient Active Problem List   Diagnosis Date Noted  . Colitis due to Clostridium difficile 10/20/2013  . Pancreatitis, acute 10/20/2013  . Lumbago 10/04/2013  . UTI (urinary tract infection) 09/27/2013  . Skin lesion 05/16/2013  . Routine general medical examination at a health care facility 02/07/2013  . Gait abnormality   . Sinus bradycardia   . Gastritis, chronic 12/26/2011  . COPD (chronic obstructive pulmonary disease) 12/05/2011  . Diverticulitis   . Hx of CABG   . Chronic kidney disease (CKD), stage III (moderate) 09/25/2011  . Insomnia, persistent 09/18/2011  . Hyperlipidemia   . Hypertension   . SOB (shortness of breath)   . Dizziness   . Syncopal episodes   . CAD (coronary artery disease)   . Ejection fraction   . Carotid artery disease     Subjective:  CC:   Chief Complaint  Patient presents with  . Acute Visit  . Diarrhea    HPI:   [name] is a 80 y.o. female who presents for Recurrence of watery profuse foul smelling stools yesterday, .  Symptoms started yesterday.  Had 6 loose stools yesterday.  No fevers or vomiting. History of C dificile colitis resulting in  At [facility] several weeks ago.  Had completely resolved on metronidazole and a probiotic. States she has continued to took the complete course of metronidazole , continues to take a probiotic daily and has not taken any probiotics    Past Medical History  Diagnosis Date  . Carotid artery disease     Doppler January, 2012, 40-59% bilateral, followup one year  . Hyperlipidemia   . Hypertension   . SOB (shortness of breath)   . Dizziness     stable now (Oct 2011) patient tells me question of TIA, we will obtain records  . Syncopal episodes     after 18 holes of golf and increase hydrochlorothiazide, resolved   . Indigestion     3 weeks, October 2011  . CAD (coronary artery disease)     Nuclear October,  2011, not gated, no scar or ischemia  . Ejection fraction     EF normal, nuclear, 2008, no echo data as of April 19, 2011  . Rheumatic fever   . History of migraines   . Kidney stones   . GERD (gastroesophageal reflux disease)   . Diverticulitis     last colonoscopy  incomplete Jan 2011  . Hx of CABG     1999  . Gait abnormality     Patient complains of "wobbly gait", December, 2013  . Sinus bradycardia     Mild, December, 2013  . Diffuse cystic mastopathy     Past Surgical History  Procedure Laterality Date  . Breast biopsy  1970  . Esophagus stretched    . Appendectomy  1950  . Coronary artery bypass graft  1999  . Tonsillectomy  1939  . Total abdominal hysterectomy  1968    due to metrorrhagia  . Cataract extraction Right 2009  . Cataract extraction Left 2011  . Colonoscopy  [id number]    Dr. Jamal Collin       The following portions of the patient's history were reviewed and updated as appropriate: Allergies, current medications, and problem list.    Review of Systems:   Patient denies headache, fevers, malaise, unintentional weight loss, skin rash, eye pain, sinus congestion and sinus pain, sore  throat, dysphagia,  hemoptysis , cough, dyspnea, wheezing, chest pain, palpitations, orthopnea, edema, abdominal pain, nausea, melena, diarrhea, constipation, flank pain, dysuria, hematuria, urinary  Frequency, nocturia, numbness, tingling, seizures,  Focal weakness, Loss of consciousness,  Tremor, insomnia, depression, anxiety, and suicidal ideation.     History   Social History  . Marital Status: Widowed    Spouse Name: N/A    Number of Children: 2  . Years of Education: N/A   Occupational History  .     Social History Main Topics  . Smoking status: Former Smoker -- 0.50 packs/day for 40 years    Types: Cigarettes    Quit date: 09/20/1983  . Smokeless tobacco: Never Used  . Alcohol Use: Yes     Comment: yes, social wine  . Drug Use: No  . Sexual Activity: Not on  file   Other Topics Concern  . Not on file   Social History Narrative  . No narrative on file    Objective:  Filed Vitals:   11/01/13 1640  BP: 126/70  Pulse: 93  Temp: 97.3 F (36.3 C)  Resp: 16     General appearance: alert, cooperative and appears stated age Ears: normal TM's and external ear canals both ears Throat: lips, mucosa, and tongue normal; teeth and gums normal Neck: no adenopathy, no carotid bruit, supple, symmetrical, trachea midline and thyroid not enlarged, symmetric, no tenderness/mass/nodules Back: symmetric, no curvature. ROM normal. No CVA tenderness. Lungs: clear to auscultation bilaterally Heart: regular rate and rhythm, S1, S2 normal, no murmur, click, rub or gallop Abdomen: soft, non-tender; bowel sounds normal; no masses,  no organomegaly Pulses: 2+ and symmetric Skin: Skin color, texture, turgor normal. No rashes or lesions Lymph nodes: Cervical, supraclavicular, and axillary nodes normal.  Assessment and Plan:  Colitis due to Clostridium difficile Suspect recurrence based on symptoms and history.  Empiric metronidazole, stool studies pending.  Continue probiotics.    Updated Medication List Outpatient Encounter Prescriptions as of 11/01/2013  Medication Sig  . ALPRAZolam (XANAX) 0.25 MG tablet TAKE 1 TABLET TWICE DAILY AS NEEDED FOR ANXIETY OR SLEEP  . amLODipine (NORVASC) 2.5 MG tablet Take 1 tablet (2.5 mg total) by mouth daily.  Marland Kitchen aspirin 81 MG tablet Take 81 mg by mouth daily.    . calcium-vitamin D (SM CALCIUM 500/VITAMIN D3) 500-400 MG-UNIT per tablet Take 1 tablet by mouth daily.    . clopidogrel (PLAVIX) 75 MG tablet TAKE 1 TABLET DAILY  . furosemide (LASIX) 20 MG tablet Take 1 tablet (20 mg total) by mouth daily.  Marland Kitchen losartan (COZAAR) 100 MG tablet TAKE 1 TABLET EVERY DAY  . multivitamin (THERAGRAN) per tablet Take 1 tablet by mouth daily.    . simvastatin (ZOCOR) 40 MG tablet Take 1 tablet (40 mg total) by mouth at bedtime.  Marland Kitchen  esomeprazole (NEXIUM) 40 MG capsule TAKE 1 CAPSULE DAILY BEFORE BREAKFAST  . metroNIDAZOLE (FLAGYL) 500 MG tablet Take 1 tablet (500 mg total) by mouth 3 (three) times daily.  . potassium chloride (K-DUR,KLOR-CON) 10 MEQ tablet Take 1 tablet (10 mEq total) by mouth 2 (two) times daily.  . Psyllium-Calcium (METAMUCIL PLUS CALCIUM) CAPS Take 2 capsules by mouth daily as needed.   . [DISCONTINUED] metroNIDAZOLE (FLAGYL) 500 MG tablet Take 500 mg by mouth 3 (three) times daily.      No orders of the defined types were placed in this encounter.    No Follow-up on file.

## 2013-11-01 NOTE — Telephone Encounter (Signed)
See other notes

## 2013-11-01 NOTE — Telephone Encounter (Signed)
Patient Information:  Caller Name: [name]  Phone: [phone]  Patient: [name]  Gender: Female  DOB: 10/31/1925  Age: 80 Years  PCP: Deborra Medina (Adults only)  Office Follow Up:  Does the office need to follow up with this patient?: Yes  Instructions For The Office: Pt needs appt today for recurring diarrhea.  Needs work-in appt today 11/01/13.  Please call pt.  TY!   Symptoms  Reason For Call & Symptoms: Pt having diarrhea that started 10/31/13.   Had about 6 diarrhea sdtools x  about 6 since 10/31/13.  Was in the hospital 3 weeks ago for C-diff.  States this diarrheadoes not have the same smell as the C Diff diarrhea.  Reviewed Health History In EMR: Yes  Reviewed Medications In EMR: Yes  Reviewed Allergies In EMR: Yes  Reviewed Surgeries / Procedures: Yes  Date of Onset of Symptoms: 10/13/2013  Treatments Tried: Was in the hospital.  Treatments Tried Worked: Yes  Guideline(s) Used:  Diarrhea  Disposition Per Guideline:   Go to Office Now  Reason For Disposition Reached:   Age > 60 years and has had > 6 diarrhea stools in past 24 hours  Advice Given:  N/A  Patient Will Follow Care Advice:  YES

## 2013-11-01 NOTE — Telephone Encounter (Signed)
Raquel stated she cannot see patient at 4.15  Please advise

## 2013-11-01 NOTE — Telephone Encounter (Signed)
Pt left vm.  States she was discharged from the hospital x2 wk ago for C. Diff.  States she has been up with diarrhea and would like to speak with someone about this.

## 2013-11-01 NOTE — Addendum Note (Signed)
Addended by: [name] on: 11/01/2013 01:01 PM   Modules accepted: Orders

## 2013-11-01 NOTE — Telephone Encounter (Signed)
Ok to give appt to raquel

## 2013-11-01 NOTE — Telephone Encounter (Signed)
double book her this afternoon at 4:30 . Tell her to come in early and give [place] a stool sample

## 2013-11-01 NOTE — Telephone Encounter (Signed)
Patient scheduled.

## 2013-11-01 NOTE — Telephone Encounter (Signed)
Patient was released from hospital two weeks ago with a diagnosis of C_DIFF patient reports 6 watery stools last night and has been approximately 3 hours since her last BM. patient is concerned c-diff has returned. Patient reports the BM have not had the foul odor as before please advise. Only appointment spot I see if [name] is at 4.15

## 2013-11-03 ENCOUNTER — Encounter: Payer: Self-pay | Admitting: Internal Medicine

## 2013-11-03 NOTE — Assessment & Plan Note (Signed)
Suspect recurrence based on symptoms and history.  Empiric metronidazole, stool studies pending.  Continue probiotics.

## 2013-11-06 ENCOUNTER — Other Ambulatory Visit: Payer: Self-pay | Admitting: *Deleted

## 2013-11-06 DIAGNOSIS — R197 Diarrhea, unspecified: Secondary | ICD-10-CM

## 2013-11-07 ENCOUNTER — Encounter: Payer: Self-pay | Admitting: Internal Medicine

## 2013-11-07 LAB — CLOSTRIDIUM DIFFICILE EIA: CDIFTX: NEGATIVE

## 2013-11-08 ENCOUNTER — Ambulatory Visit: Payer: Self-pay | Admitting: General Surgery

## 2013-11-08 ENCOUNTER — Encounter: Payer: Self-pay | Admitting: Internal Medicine

## 2013-11-08 ENCOUNTER — Ambulatory Visit (INDEPENDENT_AMBULATORY_CARE_PROVIDER_SITE_OTHER): Payer: Medicare Other | Admitting: Internal Medicine

## 2013-11-08 VITALS — BP 128/68 | HR 84 | Temp 97.9°F | Resp 16 | Wt 120.5 lb

## 2013-11-08 DIAGNOSIS — Z8719 Personal history of other diseases of the digestive system: Secondary | ICD-10-CM | POA: Diagnosis not present

## 2013-11-08 DIAGNOSIS — I2510 Atherosclerotic heart disease of native coronary artery without angina pectoris: Secondary | ICD-10-CM | POA: Diagnosis not present

## 2013-11-08 DIAGNOSIS — Z1231 Encounter for screening mammogram for malignant neoplasm of breast: Secondary | ICD-10-CM | POA: Diagnosis not present

## 2013-11-08 DIAGNOSIS — N183 Chronic kidney disease, stage 3 unspecified: Secondary | ICD-10-CM

## 2013-11-08 DIAGNOSIS — D649 Anemia, unspecified: Secondary | ICD-10-CM | POA: Diagnosis not present

## 2013-11-08 DIAGNOSIS — R634 Abnormal weight loss: Secondary | ICD-10-CM

## 2013-11-08 DIAGNOSIS — A0472 Enterocolitis due to Clostridium difficile, not specified as recurrent: Secondary | ICD-10-CM

## 2013-11-08 MED ORDER — CYANOCOBALAMIN 1000 MCG SL SUBL: 1.0000 | SUBLINGUAL_TABLET | Freq: Every day | SUBLINGUAL | Status: DC

## 2013-11-08 MED ORDER — CYANOCOBALAMIN 1000 MCG/ML IJ SOLN: 1000.0000 ug | Freq: Once | INTRAMUSCULAR | Status: DC

## 2013-11-08 NOTE — Patient Instructions (Signed)
You do not need furosemide or potassium anymore  Please take a pepcid AC before you have a hamburger tonight   You received a  B12 injection today.   Return for nonfasting  labs next month (mid march) we will check your b12 level then

## 2013-11-08 NOTE — Progress Notes (Signed)
Pre-visit discussion using our clinic review tool. No additional management support is needed unless otherwise documented below in the visit note.  

## 2013-11-10 ENCOUNTER — Encounter: Payer: Self-pay | Admitting: Internal Medicine

## 2013-11-10 DIAGNOSIS — R634 Abnormal weight loss: Secondary | ICD-10-CM | POA: Insufficient documentation

## 2013-11-10 LAB — STOOL CULTURE

## 2013-11-10 NOTE — Assessment & Plan Note (Signed)
Stable unprovoked by use of nonsteroidals. HCTZ was stopped in June.  Lab Results  Component Value Date   CREATININE 1.1 10/21/2013

## 2013-11-10 NOTE — Assessment & Plan Note (Signed)
Presumed secondary to alcohol during recent hospitalization .

## 2013-11-10 NOTE — Progress Notes (Signed)
Patient ID: [name], female   DOB: [birth year], 80 y.o.   MRN: [id number]  Patient Active Problem List   Diagnosis Date Noted  . Colitis due to Clostridium difficile 10/20/2013  . History of acute pancreatitis 10/20/2013  . Lumbago 10/04/2013  . Routine general medical examination at a health care facility 02/07/2013  . Gastritis, chronic 12/26/2011  . COPD (chronic obstructive pulmonary disease) 12/05/2011  . Diverticulitis   . Hx of CABG   . Chronic kidney disease (CKD), stage III (moderate) 09/25/2011  . Insomnia, persistent 09/18/2011  . Hyperlipidemia   . Hypertension   . CAD (coronary artery disease)   . Ejection fraction   . Carotid artery disease     Subjective:  CC:   Chief Complaint  Patient presents with  . Follow-up    1 week    HPI:   [name] is a 80 y.o. female who presents for  One week follow up on recurrence of watery fouls smelling stools with recent diagnosis and treatment of c dificile colitis.  Patient started oral metronidazole  Again last Friday prior to obtaining stool for testing and submitted stool sample 3 days later, which was tested and negative.  Her stools have become a more solid consistency although not solid yet, and she only had  2 stools yesterday.    Past Medical History  Diagnosis Date  . Carotid artery disease     Doppler January, 2012, 40-59% bilateral, followup one year  . Hyperlipidemia   . Hypertension   . SOB (shortness of breath)   . Dizziness     stable now (Oct 2011) patient tells me question of TIA, we will obtain records  . Syncopal episodes     after 18 holes of golf and increase hydrochlorothiazide, resolved   . Indigestion     3 weeks, October 2011  . CAD (coronary artery disease)     Nuclear October, 2011, not gated, no scar or ischemia  . Ejection fraction     EF normal, nuclear, 2008, no echo data as of April 19, 2011  . Rheumatic fever   . History of migraines   . Kidney stones   . GERD  (gastroesophageal reflux disease)   . Diverticulitis     last colonoscopy  incomplete Jan 2011  . Hx of CABG     1999  . Gait abnormality     Patient complains of "wobbly gait", December, 2013  . Sinus bradycardia     Mild, December, 2013  . Diffuse cystic mastopathy     Past Surgical History  Procedure Laterality Date  . Breast biopsy  1970  . Esophagus stretched    . Appendectomy  1950  . Coronary artery bypass graft  1999  . Tonsillectomy  1939  . Total abdominal hysterectomy  1968    due to metrorrhagia  . Cataract extraction Right 2009  . Cataract extraction Left 2011  . Colonoscopy  [id number]    Dr. Jamal Collin    . cyanocobalamin  1,000 mcg Intramuscular Once     The following portions of the patient's history were reviewed and updated as appropriate: Allergies, current medications, and problem list.    Review of Systems:   Patient denies headache, fevers, malaise, unintentional weight loss, skin rash, eye pain, sinus congestion and sinus pain, sore throat, dysphagia,  hemoptysis , cough, dyspnea, wheezing, chest pain, palpitations, orthopnea, edema, abdominal pain, nausea, melena, diarrhea, constipation, flank pain, dysuria, hematuria, urinary  Frequency, nocturia, numbness,  tingling, seizures,  Focal weakness, Loss of consciousness,  Tremor, insomnia, depression, anxiety, and suicidal ideation.     History   Social History  . Marital Status: Widowed    Spouse Name: N/A    Number of Children: 2  . Years of Education: N/A   Occupational History  .     Social History Main Topics  . Smoking status: Former Smoker -- 0.50 packs/day for 40 years    Types: Cigarettes    Quit date: 09/20/1983  . Smokeless tobacco: Never Used  . Alcohol Use: Yes     Comment: yes, social wine  . Drug Use: No  . Sexual Activity: Not on file   Other Topics Concern  . Not on file   Social History Narrative  . No narrative on file    Objective:  Filed Vitals:   11/08/13  1527  BP: 128/68  Pulse: 84  Temp: 97.9 F (36.6 C)  Resp: 16     General appearance: alert, cooperative and appears stated age Ears: normal TM's and external ear canals both ears Throat: lips, mucosa, and tongue normal; teeth and gums normal Neck: no adenopathy, no carotid bruit, supple, symmetrical, trachea midline and thyroid not enlarged, symmetric, no tenderness/mass/nodules Back: symmetric, no curvature. ROM normal. No CVA tenderness. Lungs: clear to auscultation bilaterally Heart: regular rate and rhythm, S1, S2 normal, no murmur, click, rub or gallop Abdomen: soft, non-tender; bowel sounds normal; no masses,  no organomegaly Pulses: 2+ and symmetric Skin: Skin color, texture, turgor normal. No rashes or lesions Lymph nodes: Cervical, supraclavicular, and axillary nodes normal.  Assessment and Plan:  Colitis due to Clostridium difficile Resolved.  Unclear whether she had a recurrence or some other infection , but it appears to have resolved.   Weight loss, non-intentional She has lost 15 lbs due to her GI illness.  Diet discussed   History of acute pancreatitis Presumed secondary to alcohol during recent hospitalization .  Chronic kidney disease (CKD), stage III (moderate) Stable unprovoked by use of nonsteroidals. HCTZ was stopped in June.  Lab Results  Component Value Date   CREATININE 1.1 10/21/2013      Updated Medication List Outpatient Encounter Prescriptions as of 11/08/2013  Medication Sig  . ALPRAZolam (XANAX) 0.25 MG tablet TAKE 1 TABLET TWICE DAILY AS NEEDED FOR ANXIETY OR SLEEP  . amLODipine (NORVASC) 2.5 MG tablet Take 1 tablet (2.5 mg total) by mouth daily.  Marland Kitchen aspirin 81 MG tablet Take 81 mg by mouth daily.    . calcium-vitamin D (SM CALCIUM 500/VITAMIN D3) 500-400 MG-UNIT per tablet Take 1 tablet by mouth daily.    . clopidogrel (PLAVIX) 75 MG tablet TAKE 1 TABLET DAILY  . esomeprazole (NEXIUM) 40 MG capsule TAKE 1 CAPSULE DAILY BEFORE BREAKFAST   . furosemide (LASIX) 20 MG tablet Take 1 tablet (20 mg total) by mouth daily.  Marland Kitchen losartan (COZAAR) 100 MG tablet TAKE 1 TABLET EVERY DAY  . metroNIDAZOLE (FLAGYL) 500 MG tablet Take 1 tablet (500 mg total) by mouth 3 (three) times daily.  . multivitamin (THERAGRAN) per tablet Take 1 tablet by mouth daily.    . potassium chloride (K-DUR,KLOR-CON) 10 MEQ tablet Take 1 tablet (10 mEq total) by mouth 2 (two) times daily.  . Psyllium-Calcium (METAMUCIL PLUS CALCIUM) CAPS Take 2 capsules by mouth daily as needed.   . simvastatin (ZOCOR) 40 MG tablet Take 1 tablet (40 mg total) by mouth at bedtime.  . Cyanocobalamin 1000 MCG SUBL Place 1 tablet (1,000  mcg total) under the tongue daily.     Orders Placed This Encounter  Procedures  . CBC with Differential  . Comprehensive metabolic panel  . TSH  . Vitamin B12  . Folate    No Follow-up on file.

## 2013-11-10 NOTE — Assessment & Plan Note (Signed)
Resolved.  Unclear whether she had a recurrence or some other infection , but it appears to have resolved.

## 2013-11-10 NOTE — Assessment & Plan Note (Signed)
She has lost 15 lbs due to her GI illness.  Diet discussed

## 2013-11-11 ENCOUNTER — Encounter: Payer: Self-pay | Admitting: General Surgery

## 2013-11-11 NOTE — Telephone Encounter (Signed)
Mailed unread MyChart message to pt  

## 2013-11-13 ENCOUNTER — Encounter: Payer: Self-pay | Admitting: General Surgery

## 2013-11-13 ENCOUNTER — Ambulatory Visit (INDEPENDENT_AMBULATORY_CARE_PROVIDER_SITE_OTHER): Payer: Medicare Other | Admitting: General Surgery

## 2013-11-13 VITALS — BP 130/70 | HR 76 | Resp 14 | Ht 63.0 in | Wt 123.0 lb

## 2013-11-13 DIAGNOSIS — N6019 Diffuse cystic mastopathy of unspecified breast: Secondary | ICD-10-CM | POA: Diagnosis not present

## 2013-11-13 NOTE — Patient Instructions (Signed)
Patient to return in one year bilateral screening mammogram.

## 2013-11-13 NOTE — Progress Notes (Signed)
Patient ID: MIRARI FICKLIN, female   DOB: 1925/12/15, 80 y.o.   MRN: [id number]  Chief Complaint  Patient presents with  . Follow-up    mammogram    HPI TATIANAH SUSTAITA is a 80 y.o. female who presents for a breast evaluation. The most recent mammogram was done on 11/06/13. Patient does perform regular self breast checks and gets regular mammograms done.  Patient recently  recovered from c-diff colitits .   HPI  Past Medical History  Diagnosis Date  . Carotid artery disease     Doppler January, 2012, 40-59% bilateral, followup one year  . Hyperlipidemia   . Hypertension   . SOB (shortness of breath)   . Dizziness     stable now (Oct 2011) patient tells me question of TIA, we will obtain records  . Syncopal episodes     after 18 holes of golf and increase hydrochlorothiazide, resolved   . Indigestion     3 weeks, October 2011  . CAD (coronary artery disease)     Nuclear October, 2011, not gated, no scar or ischemia  . Ejection fraction     EF normal, nuclear, 2008, no echo data as of April 19, 2011  . Rheumatic fever   . History of migraines   . Kidney stones   . GERD (gastroesophageal reflux disease)   . Diverticulitis     last colonoscopy  incomplete Jan 2011  . Hx of CABG     1999  . Gait abnormality     Patient complains of "wobbly gait", December, 2013  . Sinus bradycardia     Mild, December, 2013  . Diffuse cystic mastopathy   . C. difficile colitis     Past Surgical History  Procedure Laterality Date  . Breast biopsy  1970  . Esophagus stretched    . Appendectomy  1950  . Coronary artery bypass graft  1999  . Tonsillectomy  1939  . Total abdominal hysterectomy  1968    due to metrorrhagia  . Cataract extraction Right 2009  . Cataract extraction Left 2011  . Colonoscopy  [id number]    Dr. Jamal Collin    Family History  Problem Relation Age of Onset  . Heart disease Mother   . Heart disease Father   . Cancer Neg Hx   . Drug abuse Neg Hx     Social  History History  Substance Use Topics  . Smoking status: Former Smoker -- 0.50 packs/day for 40 years    Types: Cigarettes    Quit date: 09/20/1983  . Smokeless tobacco: Never Used  . Alcohol Use: Yes     Comment: yes, social wine    Allergies  Allergen Reactions  . Codeine Nausea And Vomiting    Current Outpatient Prescriptions  Medication Sig Dispense Refill  . ALPRAZolam (XANAX) 0.25 MG tablet TAKE 1 TABLET TWICE DAILY AS NEEDED FOR ANXIETY OR SLEEP  60 tablet  2  . amLODipine (NORVASC) 2.5 MG tablet Take 1 tablet (2.5 mg total) by mouth daily.  30 tablet  3  . aspirin 81 MG tablet Take 81 mg by mouth daily.        . calcium-vitamin D (SM CALCIUM 500/VITAMIN D3) 500-400 MG-UNIT per tablet Take 1 tablet by mouth daily.        . clopidogrel (PLAVIX) 75 MG tablet TAKE 1 TABLET DAILY  90 tablet  2  . Cyanocobalamin 1000 MCG SUBL Place 1 tablet (1,000 mcg total) under the tongue daily.  [place]  tablet  3  . esomeprazole (NEXIUM) 40 MG capsule TAKE 1 CAPSULE DAILY BEFORE BREAKFAST  90 capsule  1  . furosemide (LASIX) 20 MG tablet Take 1 tablet (20 mg total) by mouth daily.  30 tablet  3  . losartan (COZAAR) 100 MG tablet TAKE 1 TABLET EVERY DAY  30 tablet  5  . metroNIDAZOLE (FLAGYL) 500 MG tablet Take 1 tablet (500 mg total) by mouth 3 (three) times daily.  21 tablet  0  . multivitamin (THERAGRAN) per tablet Take 1 tablet by mouth daily.        . potassium chloride (K-DUR,KLOR-CON) 10 MEQ tablet Take 1 tablet (10 mEq total) by mouth 2 (two) times daily.  10 tablet  0  . Psyllium-Calcium (METAMUCIL PLUS CALCIUM) CAPS Take 2 capsules by mouth daily as needed.       . simvastatin (ZOCOR) 40 MG tablet Take 1 tablet (40 mg total) by mouth at bedtime.  90 tablet  3  . SPIRIVA HANDIHALER 18 MCG inhalation capsule        Current Facility-Administered Medications  Medication Dose Route Frequency Provider Last Rate Last Dose  . cyanocobalamin ((VITAMIN B-12)) injection 1,000 mcg  1,000 mcg  Intramuscular Once [name], MD        Review of Systems Review of Systems  Constitutional: Negative.   Respiratory: Negative.   Cardiovascular: Negative.     Blood pressure 130/70, pulse 76, resp. rate 14, height 5\' 3"  (1.6 m), weight 123 lb (55.792 kg).  Physical Exam Physical Exam  Constitutional: She is oriented to person, place, and time. She appears well-developed and well-nourished.  Eyes: Conjunctivae are normal.  Cardiovascular: Normal rate, regular rhythm and normal heart sounds.   Pulmonary/Chest: Breath sounds normal. Right breast exhibits no inverted nipple, no mass, no nipple discharge, no skin change and no tenderness. Left breast exhibits no inverted nipple, no mass, no nipple discharge, no skin change and no tenderness.  Abdominal: Bowel sounds are normal.  Lymphadenopathy:    She has no cervical adenopathy.    She has no axillary adenopathy.  Neurological: She is alert and oriented to person, place, and time.  Skin: Skin is warm and dry.    Data Reviewed Mammogram reviewed  Assessment    Stable exam     Plan    Patient to return in one year bilateral screening mammogram.       Maliya Marich G 11/15/2013, 9:32 AM

## 2013-11-15 ENCOUNTER — Encounter: Payer: Self-pay | Admitting: General Surgery

## 2013-11-28 ENCOUNTER — Ambulatory Visit (INDEPENDENT_AMBULATORY_CARE_PROVIDER_SITE_OTHER): Payer: Medicare Other | Admitting: *Deleted

## 2013-11-28 DIAGNOSIS — I2510 Atherosclerotic heart disease of native coronary artery without angina pectoris: Secondary | ICD-10-CM | POA: Diagnosis not present

## 2013-11-28 DIAGNOSIS — E785 Hyperlipidemia, unspecified: Secondary | ICD-10-CM

## 2013-11-29 ENCOUNTER — Ambulatory Visit (INDEPENDENT_AMBULATORY_CARE_PROVIDER_SITE_OTHER): Payer: Medicare Other | Admitting: Internal Medicine

## 2013-11-29 ENCOUNTER — Telehealth: Payer: Self-pay | Admitting: *Deleted

## 2013-11-29 ENCOUNTER — Encounter: Payer: Self-pay | Admitting: Internal Medicine

## 2013-11-29 VITALS — BP 108/68 | HR 84 | Temp 98.0°F | Wt 119.5 lb

## 2013-11-29 DIAGNOSIS — R109 Unspecified abdominal pain: Secondary | ICD-10-CM

## 2013-11-29 DIAGNOSIS — I2510 Atherosclerotic heart disease of native coronary artery without angina pectoris: Secondary | ICD-10-CM

## 2013-11-29 DIAGNOSIS — R197 Diarrhea, unspecified: Secondary | ICD-10-CM

## 2013-11-29 LAB — LIPID PANEL
CHOL/HDL RATIO: 3.9 ratio (ref 0.0–4.4)
CHOLESTEROL TOTAL: 140 mg/dL (ref 100–199)
HDL: 36 mg/dL — ABNORMAL LOW (ref >39)
LDL CALC: 62 mg/dL (ref 0–99)
Triglycerides: 210 mg/dL — ABNORMAL HIGH (ref 0–149)
VLDL CHOLESTEROL CAL: 42 mg/dL — AB (ref 5–40)

## 2013-11-29 LAB — HEPATIC FUNCTION PANEL
ALBUMIN: 4 g/dL (ref 3.5–4.7)
ALT: 15 IU/L (ref 0–32)
AST: 17 IU/L (ref 0–40)
Alkaline Phosphatase: 51 IU/L (ref 39–117)
BILIRUBIN TOTAL: 0.3 mg/dL (ref 0.0–1.2)
Bilirubin, Direct: 0.1 mg/dL (ref 0.00–0.40)
Total Protein: 5.8 g/dL — ABNORMAL LOW (ref 6.0–8.5)

## 2013-11-29 NOTE — Progress Notes (Signed)
Subjective:    Patient ID: [name], female    DOB: 1926-01-19, 80 y.o.   MRN: [id number]  HPI  Pt presents to the clinic today with c/o loose stool and abdominal cramping. She reports this started yesterday. She denies nausea vomiting. She denies fever, chills or body aches. She denies change in color of stool or blood in her stool. She has not tried anything OTC. She has not been on abx recently. She was diagnosed with C diff 09/2013.  Review of Systems  Past Medical History  Diagnosis Date  . Carotid artery disease     Doppler January, 2012, 40-59% bilateral, followup one year  . Hyperlipidemia   . Hypertension   . SOB (shortness of breath)   . Dizziness     stable now (Oct 2011) patient tells me question of TIA, we will obtain records  . Syncopal episodes     after 18 holes of golf and increase hydrochlorothiazide, resolved   . Indigestion     3 weeks, October 2011  . CAD (coronary artery disease)     Nuclear October, 2011, not gated, no scar or ischemia  . Ejection fraction     EF normal, nuclear, 2008, no echo data as of April 19, 2011  . Rheumatic fever   . History of migraines   . Kidney stones   . GERD (gastroesophageal reflux disease)   . Diverticulitis     last colonoscopy  incomplete Jan 2011  . Hx of CABG     1999  . Gait abnormality     Patient complains of "wobbly gait", December, 2013  . Sinus bradycardia     Mild, December, 2013  . Diffuse cystic mastopathy   . C. difficile colitis     Current Outpatient Prescriptions  Medication Sig Dispense Refill  . ALPRAZolam (XANAX) 0.25 MG tablet TAKE 1 TABLET TWICE DAILY AS NEEDED FOR ANXIETY OR SLEEP  60 tablet  2  . amLODipine (NORVASC) 2.5 MG tablet Take 1 tablet (2.5 mg total) by mouth daily.  30 tablet  3  . aspirin 81 MG tablet Take 81 mg by mouth daily.        . calcium-vitamin D (SM CALCIUM 500/VITAMIN D3) 500-400 MG-UNIT per tablet Take 1 tablet by mouth daily.        . clopidogrel (PLAVIX)  75 MG tablet TAKE 1 TABLET DAILY  90 tablet  2  . Cyanocobalamin 1000 MCG SUBL Place 1 tablet (1,000 mcg total) under the tongue daily.  90 tablet  3  . esomeprazole (NEXIUM) 40 MG capsule TAKE 1 CAPSULE DAILY BEFORE BREAKFAST  90 capsule  1  . furosemide (LASIX) 20 MG tablet Take 1 tablet (20 mg total) by mouth daily.  30 tablet  3  . losartan (COZAAR) 100 MG tablet TAKE 1 TABLET EVERY DAY  30 tablet  5  . metroNIDAZOLE (FLAGYL) 500 MG tablet Take 1 tablet (500 mg total) by mouth 3 (three) times daily.  21 tablet  0  . multivitamin (THERAGRAN) per tablet Take 1 tablet by mouth daily.        . potassium chloride (K-DUR,KLOR-CON) 10 MEQ tablet Take 1 tablet (10 mEq total) by mouth 2 (two) times daily.  10 tablet  0  . Psyllium-Calcium (METAMUCIL PLUS CALCIUM) CAPS Take 2 capsules by mouth daily as needed.       . simvastatin (ZOCOR) 40 MG tablet Take 1 tablet (40 mg total) by mouth at bedtime.  90 tablet  3  . SPIRIVA HANDIHALER 18 MCG inhalation capsule        Current Facility-Administered Medications  Medication Dose Route Frequency Provider Last Rate Last Dose  . cyanocobalamin ((VITAMIN B-12)) injection 1,000 mcg  1,000 mcg Intramuscular Once [name], MD        Allergies  Allergen Reactions  . Codeine Nausea And Vomiting    Family History  Problem Relation Age of Onset  . Heart disease Mother   . Heart disease Father   . Cancer Neg Hx   . Drug abuse Neg Hx     History   Social History  . Marital Status: Widowed    Spouse Name: N/A    Number of Children: 2  . Years of Education: N/A   Occupational History  .     Social History Main Topics  . Smoking status: Former Smoker -- 0.50 packs/day for 40 years    Types: Cigarettes    Quit date: 09/20/1983  . Smokeless tobacco: Never Used  . Alcohol Use: Yes     Comment: yes, social wine  . Drug Use: No  . Sexual Activity: Not on file   Other Topics Concern  . Not on file   Social History Narrative  . No  narrative on file     Constitutional: Denies fever, malaise, fatigue, headache or abrupt weight changes. .  Gastrointestinal: Pt reports abdominal cramping and diarrhea. Denies bloating, constipation, or blood in the stool.    No other specific complaints in a complete review of systems (except as listed in HPI above).     Objective:   Physical Exam   BP 108/68  Pulse 84  Temp(Src) 98 F (36.7 C) (Oral)  Wt 119 lb 8 oz (54.205 kg)  SpO2 98% Wt Readings from Last 3 Encounters:  11/29/13 119 lb 8 oz (54.205 kg)  11/13/13 123 lb (55.792 kg)  11/08/13 120 lb 8 oz (54.658 kg)    General: Appears her stated age, well developed, well nourished in NAD. Abdomen: Soft and nontender. Normal bowel sounds, no bruits noted. No distention or masses noted. Liver, spleen and kidneys non palpable.  BMET    Component Value Date/Time   NA 139 10/21/2013 1217   NA 144 03/19/2013 1450   K 4.4 10/21/2013 1217   CL 100 10/21/2013 1217   CO2 30 10/21/2013 1217   GLUCOSE 108* 10/21/2013 1217   GLUCOSE 127* 03/19/2013 1450   BUN 15 10/21/2013 1217   BUN 21 03/19/2013 1450   CREATININE 1.1 10/21/2013 1217   CREATININE 1.39* 12/26/2011 1114   CALCIUM 9.1 10/21/2013 1217   GFRNONAA 34* 03/19/2013 1450   GFRAA 39* 03/19/2013 1450    Lipid Panel     Component Value Date/Time   CHOL 120 10/14/2013   TRIG 210* 11/28/2013 0820   HDL 36* 11/28/2013 0820   HDL 48 10/14/2013   CHOLHDL 3.9 11/28/2013 0820   LDLCALC 62 11/28/2013 0820   LDLCALC 56 10/14/2013    CBC    Component Value Date/Time   WBC 7.5 04/19/2013 1118   RBC 4.11 04/19/2013 1118   HGB 10.8* 10/14/2013   HCT 39.8 04/19/2013 1118   PLT 161.0 04/19/2013 1118   MCV 96.8 04/19/2013 1118   MCHC 33.5 04/19/2013 1118   RDW 12.8 04/19/2013 1118   LYMPHSABS 1.1 04/19/2013 1118   MONOABS 0.8 04/19/2013 1118   EOSABS 0.2 04/19/2013 1118   BASOSABS 0.0 04/19/2013 1118    Hgb A1C No results found  for this basename: HGBA1C        Assessment & Plan:   Diarrhea and abdominal  cramping:  She brought in her on stool sample in a food lion bag We will send the stool sample off for c diff Stay well hydrated OTC  No medications indicated at this time until we know for sure whether it is c diff  RTC as needed or if symptoms persist or worsen

## 2013-11-29 NOTE — Telephone Encounter (Signed)
Patient has recent HX of C-Diff and has re-occurrent diarrhea since 1 pm 11/28/13 Patient stated last BM was at 2 am this morning experiencing abdominal pain and gas, patient stated she feels bloated. Please advise.

## 2013-11-29 NOTE — Telephone Encounter (Signed)
FYI Patient has HX of C-Diff patient having recurrent diarrhea since 11/28/13 with abdominal pain and no appointment available here Dr. Derrel Nip out of office. Patient has an appointment  at 2.

## 2013-11-29 NOTE — Telephone Encounter (Signed)
If abdominal bloating and diarrhea, needs to be reevaluated to confirm if reoccurring c. Diff.

## 2013-11-29 NOTE — Patient Instructions (Signed)
Diarrhea Diarrhea is frequent loose and watery bowel movements. It can cause you to feel weak and dehydrated. Dehydration can cause you to become tired and thirsty, have a dry mouth, and have decreased urination that often is dark yellow. Diarrhea is a sign of another problem, most often an infection that will not last long. In most cases, diarrhea typically lasts 2 3 days. However, it can last longer if it is a sign of something more serious. It is important to treat your diarrhea as directed by your caregive to lessen or prevent future episodes of diarrhea. CAUSES  Some common causes include:  Gastrointestinal infections caused by viruses, bacteria, or parasites.  Food poisoning or food allergies.  Certain medicines, such as antibiotics, chemotherapy, and laxatives.  Artificial sweeteners and fructose.  Digestive disorders. HOME CARE INSTRUCTIONS  Ensure adequate fluid intake (hydration): have 1 cup (8 oz) of fluid for each diarrhea episode. Avoid fluids that contain simple sugars or sports drinks, fruit juices, whole milk products, and sodas. Your urine should be clear or pale yellow if you are drinking enough fluids. Hydrate with an oral rehydration solution that you can purchase at pharmacies, retail stores, and online. You can prepare an oral rehydration solution at home by mixing the following ingredients together:    tsp table salt.   tsp baking soda.   tsp salt substitute containing potassium chloride.  1  tablespoons sugar.  1 L (34 oz) of water.  Certain foods and beverages may increase the speed at which food moves through the gastrointestinal (GI) tract. These foods and beverages should be avoided and include:  Caffeinated and alcoholic beverages.  High-fiber foods, such as raw fruits and vegetables, nuts, seeds, and whole grain breads and cereals.  Foods and beverages sweetened with sugar alcohols, such as xylitol, sorbitol, and mannitol.  Some foods may be well  tolerated and may help thicken stool including:  Starchy foods, such as rice, toast, pasta, low-sugar cereal, oatmeal, grits, baked potatoes, crackers, and bagels.  Bananas.  Applesauce.  Add probiotic-rich foods to help increase healthy bacteria in the GI tract, such as yogurt and fermented milk products.  Wash your hands well after each diarrhea episode.  Only take over-the-counter or prescription medicines as directed by your caregiver.  Take a warm bath to relieve any burning or pain from frequent diarrhea episodes. SEEK IMMEDIATE MEDICAL CARE IF:   You are unable to keep fluids down.  You have persistent vomiting.  You have blood in your stool, or your stools are black and tarry.  You do not urinate in 6 8 hours, or there is only a small amount of very dark urine.  You have abdominal pain that increases or localizes.  You have weakness, dizziness, confusion, or lightheadedness.  You have a severe headache.  Your diarrhea gets worse or does not get better.  You have a fever or persistent symptoms for more than 2 3 days.  You have a fever and your symptoms suddenly get worse. MAKE SURE YOU:   Understand these instructions.  Will watch your condition.  Will get help right away if you are not doing well or get worse. Document Released: 08/26/2002 Document Revised: 08/22/2012 Document Reviewed: 05/13/2012 [employer] Patient Information 2014 [employer].  

## 2013-11-29 NOTE — Addendum Note (Signed)
Addended by: Ellamae Sia on: 11/29/2013 02:25 PM   Modules accepted: Orders

## 2013-11-29 NOTE — Progress Notes (Signed)
Pre visit review using our clinic review tool, if applicable. No additional management support is needed unless otherwise documented below in the visit note. 

## 2013-11-30 LAB — CLOSTRIDIUM DIFFICILE EIA: CDIFTX: NEGATIVE

## 2013-12-03 ENCOUNTER — Other Ambulatory Visit: Payer: Self-pay | Admitting: Internal Medicine

## 2013-12-11 ENCOUNTER — Encounter: Payer: Medicare Other | Admitting: Internal Medicine

## 2013-12-24 ENCOUNTER — Encounter: Payer: Self-pay | Admitting: Internal Medicine

## 2013-12-24 ENCOUNTER — Ambulatory Visit (INDEPENDENT_AMBULATORY_CARE_PROVIDER_SITE_OTHER): Payer: Medicare Other | Admitting: Internal Medicine

## 2013-12-24 VITALS — BP 130/58 | HR 74 | Temp 98.4°F | Resp 16 | Wt 121.8 lb

## 2013-12-24 DIAGNOSIS — R197 Diarrhea, unspecified: Secondary | ICD-10-CM | POA: Diagnosis not present

## 2013-12-24 DIAGNOSIS — Z0000 Encounter for general adult medical examination without abnormal findings: Secondary | ICD-10-CM

## 2013-12-24 DIAGNOSIS — Z23 Encounter for immunization: Secondary | ICD-10-CM | POA: Diagnosis not present

## 2013-12-24 DIAGNOSIS — I2510 Atherosclerotic heart disease of native coronary artery without angina pectoris: Secondary | ICD-10-CM

## 2013-12-24 DIAGNOSIS — D649 Anemia, unspecified: Secondary | ICD-10-CM

## 2013-12-24 NOTE — Progress Notes (Signed)
Pre-visit discussion using our clinic review tool. No additional management support is needed unless otherwise documented below in the visit note.  

## 2013-12-24 NOTE — Patient Instructions (Addendum)
You may have developed Irritable Bowel Syndrome from your ordeal with c dificile colitis   You can try taking  One dose of Immodium and see if it slows down your bowels.   You can also try taking Lactase before any meals with dairy products.  It is available at any pharmacy  Try adding metamucil one serving daily to help bulk up your stools  Continue taking your probiotic daily  You received   your  last pneumonia vaccine for life.

## 2013-12-24 NOTE — Progress Notes (Signed)
Patient ID: [name], female   DOB: 1926-06-03, 80 y.o.   MRN: [id number] The patient is here for annual Medicare wellness examination and management of other chronic and acute problems.  She ha sbeen having recurrent diarrhea episodic since February . Watery stools started again this Sunday .  Was tested for c dificile two weeks ago for same,..  Test was negative again .     The risk factors are reflected in the social history.  The roster of all physicians providing medical care to patient - is listed in the Snapshot section of the chart.  Activities of daily living:  The patient is 100% independent in all ADLs: dressing, toileting, feeding as well as independent mobility  Home safety : The patient has smoke detectors in the home. They wear seatbelts.  There are no firearms at home. There is no violence in the home.   There is no risks for hepatitis, STDs or HIV. There is no   history of blood transfusion. They have no travel history to infectious disease endemic areas of the world.  The patient has seen their dentist in the last six month. They have seen their eye doctor in the last year. They admit to slight hearing difficulty with regard to whispered voices and some television programs.  They have deferred audiologic testing in the last year.  They do not  have excessive sun exposure. Discussed the need for sun protection: hats, long sleeves and use of sunscreen if there is significant sun exposure.   Diet: the importance of a healthy diet is discussed. They do have a healthy diet.  The benefits of regular aerobic exercise were discussed. She walks 4 times per week ,  20 minutes.   Depression screen: there are no signs or vegative symptoms of depression- irritability, change in appetite, anhedonia, sadness/tearfullness.  Cognitive assessment: the patient manages all their financial and personal affairs and is actively engaged. They could relate day,date,year and events; recalled  2/3 objects at 3 minutes; performed clock-face test normally.  The following portions of the patient's history were reviewed and updated as appropriate: allergies, current medications, past family history, past medical history,  past surgical history, past social history  and problem list.  Visual acuity was not assessed per patient preference since she has regular follow up with her ophthalmologist. Hearing and body mass index were assessed and reviewed.   During the course of the visit the patient was educated and counseled about appropriate screening and preventive services including : fall prevention , diabetes screening, nutrition counseling, colorectal cancer screening, and recommended immunizations.    Objective:  BP 130/58  Pulse 74  Temp(Src) 98.4 F (36.9 C) (Oral)  Resp 16  Wt 121 lb 12 oz (55.225 kg)  SpO2 98%  General appearance: alert, cooperative and appears stated age Head: Normocephalic, without obvious abnormality, atraumatic Eyes: conjunctivae/corneas clear. PERRL, EOM's intact. Fundi benign. Ears: normal TM's and external ear canals both ears Nose: Nares normal. Septum midline. Mucosa normal. No drainage or sinus tenderness. Throat: lips, mucosa, and tongue normal; teeth and gums normal Neck: no adenopathy, no carotid bruit, no JVD, supple, symmetrical, trachea midline and thyroid not enlarged, symmetric, no tenderness/mass/nodules Lungs: clear to auscultation bilaterally Breasts: normal appearance, no masses or tenderness Heart: regular rate and rhythm, S1, S2 normal, no murmur, click, rub or gallop Abdomen: soft, non-tender; bowel sounds normal; no masses,  no organomegaly Extremities: extremities normal, atraumatic, no cyanosis or edema Pulses: 2+ and symmetric Skin: Skin  color, texture, turgor normal. No rashes or lesions Neurologic: Alert and oriented X 3, normal strength and tone. Normal symmetric reflexes. Normal coordination and gait.   Assessment and  Plan :  Diarrhea I suspect her recurrent diarrhea is due to irritable bowel which has been associated with recovery from c fidicile colitis.  Reassurance provided.  Continue probiotics.  Add immodium and metamucil. Discussed food intolerances and recommended lactase pre dairy.   Routine general medical examination at a health care facility Annual comprehensive exam was done including breast, excluding pelvic and PAP smear. All screenings have been addressed .    Updated Medication List Outpatient Encounter Prescriptions as of 12/24/2013  Medication Sig  . amLODipine (NORVASC) 2.5 MG tablet Take 1 tablet (2.5 mg total) by mouth daily.  Marland Kitchen aspirin 81 MG tablet Take 81 mg by mouth daily.    . calcium-vitamin D (SM CALCIUM 500/VITAMIN D3) 500-400 MG-UNIT per tablet Take 1 tablet by mouth daily.    . clopidogrel (PLAVIX) 75 MG tablet TAKE 1 TABLET EVERY DAY  . Cyanocobalamin 1000 MCG SUBL Place 1 tablet (1,000 mcg total) under the tongue daily.  Marland Kitchen esomeprazole (NEXIUM) 40 MG capsule TAKE 1 CAPSULE DAILY BEFORE BREAKFAST  . losartan (COZAAR) 100 MG tablet TAKE 1 TABLET EVERY DAY  . multivitamin (THERAGRAN) per tablet Take 1 tablet by mouth daily.    . Psyllium-Calcium (METAMUCIL PLUS CALCIUM) CAPS Take 2 capsules by mouth daily as needed.   . simvastatin (ZOCOR) 40 MG tablet Take 1 tablet (40 mg total) by mouth at bedtime.  Marland Kitchen SPIRIVA HANDIHALER 18 MCG inhalation capsule   . [DISCONTINUED] ALPRAZolam (XANAX) 0.25 MG tablet TAKE 1 TABLET TWICE DAILY AS NEEDED FOR ANXIETY OR SLEEP  . [DISCONTINUED] furosemide (LASIX) 20 MG tablet Take 1 tablet (20 mg total) by mouth daily.  . [DISCONTINUED] metroNIDAZOLE (FLAGYL) 500 MG tablet Take 1 tablet (500 mg total) by mouth 3 (three) times daily.  . [DISCONTINUED] potassium chloride (K-DUR,KLOR-CON) 10 MEQ tablet Take 1 tablet (10 mEq total) by mouth 2 (two) times daily.  . [DISCONTINUED] cyanocobalamin ((VITAMIN B-12)) injection 1,000 mcg

## 2013-12-25 ENCOUNTER — Encounter: Payer: Self-pay | Admitting: Internal Medicine

## 2013-12-25 DIAGNOSIS — R197 Diarrhea, unspecified: Secondary | ICD-10-CM | POA: Insufficient documentation

## 2013-12-25 LAB — CBC WITH DIFFERENTIAL/PLATELET
BASOS PCT: 0.4 % (ref 0.0–3.0)
Basophils Absolute: 0 10*3/uL (ref 0.0–0.1)
EOS ABS: 0.2 10*3/uL (ref 0.0–0.7)
Eosinophils Relative: 3.3 % (ref 0.0–5.0)
HCT: 38.9 % (ref 36.0–46.0)
HEMOGLOBIN: 13 g/dL (ref 12.0–15.0)
Lymphocytes Relative: 25.7 % (ref 12.0–46.0)
Lymphs Abs: 1.6 10*3/uL (ref 0.7–4.0)
MCHC: 33.5 g/dL (ref 30.0–36.0)
MCV: 97.6 fl (ref 78.0–100.0)
Monocytes Absolute: 0.7 10*3/uL (ref 0.1–1.0)
Monocytes Relative: 12 % (ref 3.0–12.0)
NEUTROS ABS: 3.6 10*3/uL (ref 1.4–7.7)
Neutrophils Relative %: 58.6 % (ref 43.0–77.0)
Platelets: 197 10*3/uL (ref 150.0–400.0)
RBC: 3.99 Mil/uL (ref 3.87–5.11)
RDW: 13.1 % (ref 11.5–14.6)
WBC: 6.2 10*3/uL (ref 4.5–10.5)

## 2013-12-25 LAB — COMPREHENSIVE METABOLIC PANEL
ALBUMIN: 3.9 g/dL (ref 3.5–5.2)
ALK PHOS: 56 U/L (ref 39–117)
ALT: 16 U/L (ref 0–35)
AST: 20 U/L (ref 0–37)
BUN: 19 mg/dL (ref 6–23)
CHLORIDE: 106 meq/L (ref 96–112)
CO2: 25 mEq/L (ref 19–32)
Calcium: 9.6 mg/dL (ref 8.4–10.5)
Creatinine, Ser: 1.1 mg/dL (ref 0.4–1.2)
GFR: 48.6 mL/min — ABNORMAL LOW (ref >60.00)
Glucose, Bld: 98 mg/dL (ref 70–99)
POTASSIUM: 3.8 meq/L (ref 3.5–5.1)
SODIUM: 140 meq/L (ref 135–145)
Total Bilirubin: 0.7 mg/dL (ref 0.3–1.2)
Total Protein: 6.3 g/dL (ref 6.0–8.3)

## 2013-12-25 LAB — FOLATE: Folate: >25 ng/mL (ref >5.9)

## 2013-12-25 LAB — VITAMIN B12: VITAMIN B 12: 633 pg/mL (ref 211–911)

## 2013-12-25 LAB — TSH: TSH: 1.7 u[IU]/mL (ref 0.35–5.50)

## 2013-12-25 NOTE — Assessment & Plan Note (Signed)
I suspect her recurrent diarrhea is due to irritable bowel which has been associated with recovery from c fidicile colitis.  Reassurance provided.  Continue probiotics.  Add immodium and metamucil. Discussed food intolerances and recommended lactase pre dairy.

## 2013-12-25 NOTE — Assessment & Plan Note (Signed)
Annual comprehensive exam was done including breast, excluding pelvic and PAP smear. All screenings have been addressed .  

## 2013-12-30 NOTE — Telephone Encounter (Signed)
Mailed unread message to pt  

## 2014-01-17 DIAGNOSIS — Z85828 Personal history of other malignant neoplasm of skin: Secondary | ICD-10-CM | POA: Diagnosis not present

## 2014-01-17 DIAGNOSIS — L570 Actinic keratosis: Secondary | ICD-10-CM | POA: Diagnosis not present

## 2014-01-17 DIAGNOSIS — L821 Other seborrheic keratosis: Secondary | ICD-10-CM | POA: Diagnosis not present

## 2014-01-20 DIAGNOSIS — H43819 Vitreous degeneration, unspecified eye: Secondary | ICD-10-CM | POA: Diagnosis not present

## 2014-02-04 ENCOUNTER — Other Ambulatory Visit: Payer: Self-pay | Admitting: Internal Medicine

## 2014-02-04 ENCOUNTER — Telehealth: Payer: Self-pay | Admitting: Internal Medicine

## 2014-02-04 DIAGNOSIS — R197 Diarrhea, unspecified: Secondary | ICD-10-CM | POA: Diagnosis not present

## 2014-02-04 LAB — CLOSTRIDIUM DIFFICILE(ARMC)

## 2014-02-04 NOTE — Telephone Encounter (Signed)
Patient having loose stools, wakes with stomach pain, and has 2 to 3 loose bowel movements , patient takes Imodium and loose stools stop for about 24 hours and return with same symptoms. Patient history of C-Diff afraid she is having symptoms again,2.30 spot held can we use this and give patient Kit to bring sample "? Please advise.

## 2014-02-04 NOTE — Telephone Encounter (Signed)
Needs to go to [facility] lab for the c dificile stool test  Can pick up order form here first

## 2014-02-04 NOTE — Telephone Encounter (Signed)
Patient notified to pick up order and will pick up today.

## 2014-02-06 ENCOUNTER — Telehealth: Payer: Self-pay | Admitting: Internal Medicine

## 2014-02-06 NOTE — Telephone Encounter (Signed)
stool studies were negative for Clostridium difficile

## 2014-02-06 NOTE — Telephone Encounter (Signed)
Left detailed message per DPR  

## 2014-02-19 ENCOUNTER — Encounter: Payer: Self-pay | Admitting: Internal Medicine

## 2014-03-03 DIAGNOSIS — L820 Inflamed seborrheic keratosis: Secondary | ICD-10-CM | POA: Diagnosis not present

## 2014-03-03 DIAGNOSIS — R21 Rash and other nonspecific skin eruption: Secondary | ICD-10-CM | POA: Diagnosis not present

## 2014-03-03 DIAGNOSIS — L570 Actinic keratosis: Secondary | ICD-10-CM | POA: Diagnosis not present

## 2014-03-03 DIAGNOSIS — D485 Neoplasm of uncertain behavior of skin: Secondary | ICD-10-CM | POA: Diagnosis not present

## 2014-03-06 ENCOUNTER — Telehealth: Payer: Self-pay

## 2014-03-06 MED ORDER — NITROGLYCERIN 0.4 MG SL SUBL: 0.4000 mg | SUBLINGUAL_TABLET | SUBLINGUAL | Status: DC | PRN

## 2014-03-06 NOTE — Telephone Encounter (Signed)
Pt called to schedule appt, states she is having "funny feelings in her chest and neck, not sharp pains, just annoying". Please call, ok to leave msg. Pt is scheduled for 03/14/2014

## 2014-03-06 NOTE — Telephone Encounter (Signed)
Spoke w/ pt.  She reports a "nagging" pain on the side of her neck and the center of there chest above her bra for the past 4 days.  Reports that it comes and goes at different times of the day, is eventually goes away after aspirin and heating pad. Pt denies any exertion recently due to the heat, has not played her regular golf game recently.  Denies SOB, nausea or sweating.  Pt sched to see Dr. Rockey Situ 6/26. She reports that she previously had an rx for nitroglycerin, but this expired about 5 years ago.  She is requesting a new rx for this, as she thinks it will ease her mind to have.  Pt education provided on this med.

## 2014-03-06 NOTE — Telephone Encounter (Signed)
Per Dr. Rockey Situ, ok to call in nitro for pt.

## 2014-03-14 ENCOUNTER — Ambulatory Visit (INDEPENDENT_AMBULATORY_CARE_PROVIDER_SITE_OTHER): Payer: Medicare Other | Admitting: Cardiovascular Disease

## 2014-03-14 ENCOUNTER — Encounter: Payer: Self-pay | Admitting: Cardiovascular Disease

## 2014-03-14 VITALS — BP 110/60 | HR 67 | Ht 63.0 in | Wt 128.0 lb

## 2014-03-14 DIAGNOSIS — I779 Disorder of arteries and arterioles, unspecified: Secondary | ICD-10-CM | POA: Diagnosis not present

## 2014-03-14 DIAGNOSIS — I739 Peripheral vascular disease, unspecified: Secondary | ICD-10-CM

## 2014-03-14 DIAGNOSIS — I2510 Atherosclerotic heart disease of native coronary artery without angina pectoris: Secondary | ICD-10-CM

## 2014-03-14 DIAGNOSIS — I10 Essential (primary) hypertension: Secondary | ICD-10-CM | POA: Diagnosis not present

## 2014-03-14 DIAGNOSIS — Z951 Presence of aortocoronary bypass graft: Secondary | ICD-10-CM | POA: Diagnosis not present

## 2014-03-14 DIAGNOSIS — E785 Hyperlipidemia, unspecified: Secondary | ICD-10-CM

## 2014-03-14 NOTE — Progress Notes (Signed)
Patient ID: [name], female    DOB: [birth year], 80 y.o.   MRN: [id number]  HPI Comments: [name] is a pleasant 79 year old woman with a history of CAD, bypass surgery in 1999, long history of smoking for 40 years who quit in 1985, COPD, chronic renal insufficiency, hypertension, hyperlipidemia, 40-50% bilateral carotid arterial disease who presents for routine followup  She has not had any intervention since her bypass surgery. No cardiac catheterizations, no stent placement. Since her last clinic visit, she reports having C. difficile in January 2015. She was in the hospital for several days.  The main reason she has presented today is she reports having a nagging discomfort on the left side of her chest typically when she is sleeping in her bed at night. She denies having any symptoms during the daytime or with exertion. She describes a 5/10 discomfort. She is uncertain if it is muscular or deeper. No change in her activities secondary to this discomfort. She typically works out on a treadmill several days per week Previous anginal symptoms prior to bypass surgery was shortness of breath with exertion. She denies having any of these symptoms  Last stress test was October 2011. Carotid ultrasound showing 40-59% disease  Prior lab work showing total cholesterol 140, LDL 62  EKG shows normal sinus rhythm with rate 67 beats per minute with T wave abnormality through the anterior precordial leads   Past Medical History . Carotid artery disease    Doppler   40-59% bilateral . Hyperlipidemia  . Hypertension  . SOB (shortness of breath)  . Dizziness    stable now (Oct 2011) patient tells me question of TIA, we will obtain records . Syncopal episodes    after 18 holes of golf and increase hydrochlorothiazide, resolved  . CAD (coronary artery disease)  Nuclear October, 2011, not gated, no scar or ischemia . Ejection fraction EF normal, nuclear, 2008, no echo data as of April 19, 2011 . Rheumatic fever  . History of migraines  . Kidney stones  . GERD (gastroesophageal reflux disease)  . Diverticulitis last colonoscopy  incomplete Jan 2011 . Hx of CABG 1999     Outpatient Encounter Prescriptions as of 03/14/2014  Medication Sig  . amLODipine (NORVASC) 2.5 MG tablet TAKE 1 TABLET EVERY DAY  . aspirin 81 MG tablet Take 81 mg by mouth daily.    . calcium-vitamin D (SM CALCIUM 500/VITAMIN D3) 500-400 MG-UNIT per tablet Take 1 tablet by mouth daily.    . clopidogrel (PLAVIX) 75 MG tablet TAKE 1 TABLET EVERY DAY  . Cyanocobalamin 1000 MCG SUBL Place 1 tablet (1,000 mcg total) under the tongue daily.  Marland Kitchen esomeprazole (NEXIUM) 40 MG capsule TAKE 1 CAPSULE DAILY BEFORE BREAKFAST  . losartan (COZAAR) 100 MG tablet TAKE 1 TABLET EVERY DAY  . multivitamin (THERAGRAN) per tablet Take 1 tablet by mouth daily.    . nitroGLYCERIN (NITROSTAT) 0.4 MG SL tablet Place 1 tablet (0.4 mg total) under the tongue every 5 (five) minutes as needed for chest pain.  Marland Kitchen Psyllium-Calcium (METAMUCIL PLUS CALCIUM) CAPS Take 2 capsules by mouth daily as needed.   . simvastatin (ZOCOR) 40 MG tablet Take 1 tablet (40 mg total) by mouth at bedtime.  Marland Kitchen SPIRIVA HANDIHALER 18 MCG inhalation capsule Place 18 mcg into inhaler and inhale daily.     Review of Systems  Constitutional: Negative.   HENT: Negative.   Eyes: Negative.   Respiratory: Negative.   Cardiovascular: Positive for chest pain.  Gastrointestinal: Negative.   Endocrine: Negative.   Musculoskeletal: Negative.   Skin: Negative.   Allergic/Immunologic: Negative.   Neurological: Negative.   Hematological: Negative.   Psychiatric/Behavioral: Negative.   All other systems reviewed and are negative.   BP 110/60  Pulse 67  Ht 5\' 3"  (1.6 m)  Wt 128 lb (58.06 kg)  BMI 22.68 kg/m2  Physical Exam  Nursing note and vitals reviewed. Constitutional: She is oriented to person, place, and time. She appears well-developed and  well-nourished.  HENT:  Head: Normocephalic.  Nose: Nose normal.  Mouth/Throat: Oropharynx is clear and moist.  Eyes: Conjunctivae are normal. Pupils are equal, round, and reactive to light.  Neck: Normal range of motion. Neck supple. No JVD present.  Cardiovascular: Normal rate, regular rhythm, S1 normal, S2 normal, normal heart sounds and intact distal pulses.  Exam reveals no gallop and no friction rub.   No murmur heard. Pulmonary/Chest: Effort normal and breath sounds normal. No respiratory distress. She has no wheezes. She has no rales. She exhibits no tenderness.  Abdominal: Soft. Bowel sounds are normal. She exhibits no distension. There is no tenderness.  Musculoskeletal: Normal range of motion. She exhibits no edema and no tenderness.  Lymphadenopathy:    She has no cervical adenopathy.  Neurological: She is alert and oriented to person, place, and time. Coordination normal.  Skin: Skin is warm and dry. No rash noted. No erythema.  Psychiatric: She has a normal mood and affect. Her behavior is normal. Judgment and thought content normal.    Assessment and Plan

## 2014-03-14 NOTE — Assessment & Plan Note (Signed)
Cholesterol is at goal on the current lipid regimen. No changes to the medications were made.  

## 2014-03-14 NOTE — Assessment & Plan Note (Signed)
Blood pressure is well controlled on today's visit. No changes made to the medications. 

## 2014-03-14 NOTE — Patient Instructions (Signed)
You are doing well. No medication changes were made.  Please call us if you have new issues that need to be addressed before your next appt.  Your physician wants you to follow-up in: 6 months.  You will receive a reminder letter in the mail two months in advance. If you don't receive a letter, please call our office to schedule the follow-up appointment.   

## 2014-03-14 NOTE — Assessment & Plan Note (Signed)
Atypical left-sided chest pain. Symptoms only at rest in bed. No difficulty with exertion. We had a long discussion about whether to proceed with stress testing or watchful waiting. For now we will monitor her symptoms. No stress test has been ordered

## 2014-03-14 NOTE — Assessment & Plan Note (Signed)
Stable carotid arterial disease. Continue aggressive cholesterol management

## 2014-03-29 ENCOUNTER — Other Ambulatory Visit: Payer: Self-pay | Admitting: Internal Medicine

## 2014-04-11 ENCOUNTER — Telehealth: Payer: Self-pay

## 2014-04-11 ENCOUNTER — Other Ambulatory Visit: Payer: Self-pay | Admitting: Pulmonary Disease

## 2014-04-11 MED ORDER — TIOTROPIUM BROMIDE MONOHYDRATE 18 MCG IN CAPS: 18.0000 ug | ORAL_CAPSULE | Freq: Every day | RESPIRATORY_TRACT | Status: DC

## 2014-04-11 NOTE — Telephone Encounter (Signed)
Pt states she is feeling dizzy, BP is 185/85.

## 2014-04-11 NOTE — Telephone Encounter (Signed)
Per last ov 1.27.14 w/ BQ: Patient Instructions     You are doing great!  Keep taking the spiriva daily. Keep exercising daily.  Wash your hands frequently when going out.  Get a flu shot every year.  We will see you back in one year or sooner if needed   Pt not seen since 1.27.14 with no pending appts Spiriva refilled with note that pt must schedule/keep appt for future refills Nothing further needed; will sign off.

## 2014-04-11 NOTE — Telephone Encounter (Signed)
Spoke w/ pt.  She reports that she is at the beauty parlor and feels dizzy. Reports there she had a "funny sensation" in her chest last night, describes it as an achy feeling across her chest to her armpit, she took 1 nitro and went to sleep. She took 1 meclizine this am, drove to salon and stated that she felt dizzy, so one of the beauticians took her BP w/ his monitor and advised her to call us immediately. Pt denies HA or n/v.  States she does not drink much water and has had C.diff since Jan, but has not exp dizziness until today. Advised pt to obtain a BP monitor and monitor her BP over the next several days and call me w/ readings.  She asks that I make Dr. Rockey Situ aware in case he has any suggestions for her.

## 2014-04-12 DIAGNOSIS — R42 Dizziness and giddiness: Secondary | ICD-10-CM | POA: Diagnosis not present

## 2014-04-12 DIAGNOSIS — R5381 Other malaise: Secondary | ICD-10-CM | POA: Diagnosis not present

## 2014-04-12 DIAGNOSIS — R5383 Other fatigue: Secondary | ICD-10-CM | POA: Diagnosis not present

## 2014-04-14 ENCOUNTER — Telehealth: Payer: Self-pay

## 2014-04-14 NOTE — Telephone Encounter (Signed)
Pt daughter called, states pt has been very dizzy the last couple days, she went to walk in on Saturday, 04/12/2014, a "tad better today, but not really any better" Wants to ask if it could be heart related, states her BP was high on Thursday(phone note was taken). She has an appt on 7/30 with Dr. Anda Latina

## 2014-04-14 NOTE — Telephone Encounter (Signed)
Spoke w/ pt's daughter.  She reports that pt is feeling better today, but c/o dizziness over the weekend. She believes that it is her ear bothering her.  Pt has appt w/ Pervis Hocking, NP tomorrow and appt w/ Dr. Tami Ribas on Thursday.  Advised her that I had spoken w/ pt on Friday and advised her to obtain BP readings over the weekend.  She reports that pt's BP has been wnl all weekend.  Advised her to keep her appts and call us if we can be of further assistance.  She verbalizes frustration at the inability to obtain an emergency appt at her [facility] offices, but is grateful for the phone call back.

## 2014-04-15 ENCOUNTER — Encounter: Payer: Self-pay | Admitting: Adult Health

## 2014-04-15 ENCOUNTER — Ambulatory Visit (INDEPENDENT_AMBULATORY_CARE_PROVIDER_SITE_OTHER): Payer: Medicare Other | Admitting: Adult Health

## 2014-04-15 VITALS — BP 118/62 | HR 79 | Temp 97.9°F | Resp 14 | Ht 63.0 in | Wt 125.1 lb

## 2014-04-15 DIAGNOSIS — I2510 Atherosclerotic heart disease of native coronary artery without angina pectoris: Secondary | ICD-10-CM

## 2014-04-15 DIAGNOSIS — R42 Dizziness and giddiness: Secondary | ICD-10-CM

## 2014-04-15 NOTE — Progress Notes (Signed)
Patient ID: [name], female   DOB: 1925/10/03, 80 y.o.   MRN: [id number]   Subjective:    Patient ID: [name], female    DOB: [birth year], 80 y.o.   MRN: [id number]  HPI  Pt is a pleasant 80 y/o female who presents to clinic with dizziness that start first thing in the morning on Friday. She went to urgent care on Saturday and was given meclizine. She reports thinking she was improved somewhat yesterday but symptoms still persist today. No nausea. No syncope or near syncope. She reports dizziness with turning in the bed, changing positions, moving her head. She has been using extra care not to fall. Has appt with ENT tomorrow.  Past Medical History  Diagnosis Date  . Carotid artery disease     Doppler January, 2012, 40-59% bilateral, followup one year  . Hyperlipidemia   . Hypertension   . SOB (shortness of breath)   . Dizziness     stable now (Oct 2011) patient tells me question of TIA, we will obtain records  . Syncopal episodes     after 18 holes of golf and increase hydrochlorothiazide, resolved   . Indigestion     3 weeks, October 2011  . CAD (coronary artery disease)     Nuclear October, 2011, not gated, no scar or ischemia  . Ejection fraction     EF normal, nuclear, 2008, no echo data as of April 19, 2011  . Rheumatic fever   . History of migraines   . Kidney stones   . GERD (gastroesophageal reflux disease)   . Diverticulitis     last colonoscopy  incomplete Jan 2011  . Hx of CABG     1999  . Gait abnormality     Patient complains of "wobbly gait", December, 2013  . Sinus bradycardia     Mild, December, 2013  . Diffuse cystic mastopathy   . C. difficile colitis     Current Outpatient Prescriptions on File Prior to Visit  Medication Sig Dispense Refill  . amLODipine (NORVASC) 2.5 MG tablet TAKE 1 TABLET EVERY DAY  30 tablet  5  . aspirin 81 MG tablet Take 81 mg by mouth daily.        . calcium-vitamin D (SM CALCIUM 500/VITAMIN D3) 500-400  MG-UNIT per tablet Take 1 tablet by mouth daily.        . clopidogrel (PLAVIX) 75 MG tablet TAKE 1 TABLET EVERY DAY  90 tablet  1  . Cyanocobalamin 1000 MCG SUBL Place 1 tablet (1,000 mcg total) under the tongue daily.  90 tablet  3  . esomeprazole (NEXIUM) 40 MG capsule TAKE 1 CAPSULE DAILY BEFORE BREAKFAST  90 capsule  1  . losartan (COZAAR) 100 MG tablet TAKE 1 TABLET EVERY DAY  30 tablet  6  . multivitamin (THERAGRAN) per tablet Take 1 tablet by mouth daily.        . nitroGLYCERIN (NITROSTAT) 0.4 MG SL tablet Place 1 tablet (0.4 mg total) under the tongue every 5 (five) minutes as needed for chest pain.  25 tablet  6  . Psyllium-Calcium (METAMUCIL PLUS CALCIUM) CAPS Take 2 capsules by mouth daily as needed.       . simvastatin (ZOCOR) 40 MG tablet Take 1 tablet (40 mg total) by mouth at bedtime.  90 tablet  3  . tiotropium (SPIRIVA HANDIHALER) 18 MCG inhalation capsule Place 1 capsule (18 mcg total) into inhaler and inhale daily. NEEDS APPT WITH DR Lake Bells  30 capsule  2   No current facility-administered medications on file prior to visit.    Review of Systems  Constitutional: Negative for fever, chills and fatigue.  Respiratory: Negative.   Cardiovascular: Negative.   Neurological: Positive for dizziness. Negative for syncope, weakness, light-headedness and headaches.       Objective:  BP 118/62  Pulse 79  Temp(Src) 97.9 F (36.6 C) (Oral)  Resp 14  Ht 5\' 3"  (1.6 m)  Wt 125 lb 1.9 oz (56.754 kg)  BMI 22.17 kg/m2  SpO2 98%   Physical Exam  Constitutional: She is oriented to person, place, and time. She appears well-developed and well-nourished. No distress.  HENT:  Head: Normocephalic and atraumatic.  Bilateral ears with cerumen buildup. Right ear is completely occluded. Appt with ENT tomorrow.  Eyes:  Positive for nystagmus with Dix-hallpike maneuver  Cardiovascular: Normal rate and regular rhythm.   Pulmonary/Chest: Effort normal. No respiratory distress.    Musculoskeletal: Normal range of motion.  Lymphadenopathy:    She has no cervical adenopathy.  Neurological: She is alert and oriented to person, place, and time. No cranial nerve deficit.  Skin: Skin is warm and dry.  Psychiatric: She has a normal mood and affect. Her behavior is normal. Judgment and thought content normal.      Assessment & Plan:   1. Dizziness Positive Dix-Hallpike. Suspect BPPV. Discussed exercises to do at home. Continue meclizine. If symptoms do not improve within 2-3 days will refer to physical therapy.

## 2014-04-15 NOTE — Progress Notes (Signed)
Pre visit review using our clinic review tool, if applicable. No additional management support is needed unless otherwise documented below in the visit note. 

## 2014-04-16 DIAGNOSIS — H8110 Benign paroxysmal vertigo, unspecified ear: Secondary | ICD-10-CM | POA: Diagnosis not present

## 2014-04-16 DIAGNOSIS — H6120 Impacted cerumen, unspecified ear: Secondary | ICD-10-CM | POA: Diagnosis not present

## 2014-04-25 DIAGNOSIS — L905 Scar conditions and fibrosis of skin: Secondary | ICD-10-CM | POA: Diagnosis not present

## 2014-04-25 DIAGNOSIS — C44721 Squamous cell carcinoma of skin of unspecified lower limb, including hip: Secondary | ICD-10-CM | POA: Diagnosis not present

## 2014-04-25 DIAGNOSIS — D485 Neoplasm of uncertain behavior of skin: Secondary | ICD-10-CM | POA: Diagnosis not present

## 2014-05-02 DIAGNOSIS — R42 Dizziness and giddiness: Secondary | ICD-10-CM | POA: Diagnosis not present

## 2014-05-09 DIAGNOSIS — C44621 Squamous cell carcinoma of skin of unspecified upper limb, including shoulder: Secondary | ICD-10-CM | POA: Diagnosis not present

## 2014-05-09 DIAGNOSIS — D485 Neoplasm of uncertain behavior of skin: Secondary | ICD-10-CM | POA: Diagnosis not present

## 2014-05-09 DIAGNOSIS — C44721 Squamous cell carcinoma of skin of unspecified lower limb, including hip: Secondary | ICD-10-CM | POA: Diagnosis not present

## 2014-05-23 DIAGNOSIS — C44621 Squamous cell carcinoma of skin of unspecified upper limb, including shoulder: Secondary | ICD-10-CM | POA: Diagnosis not present

## 2014-05-30 ENCOUNTER — Other Ambulatory Visit: Payer: Self-pay | Admitting: Internal Medicine

## 2014-05-30 DIAGNOSIS — J343 Hypertrophy of nasal turbinates: Secondary | ICD-10-CM | POA: Diagnosis not present

## 2014-05-30 DIAGNOSIS — J018 Other acute sinusitis: Secondary | ICD-10-CM | POA: Diagnosis not present

## 2014-06-02 ENCOUNTER — Other Ambulatory Visit: Payer: Self-pay | Admitting: Internal Medicine

## 2014-06-02 ENCOUNTER — Telehealth: Payer: Self-pay | Admitting: Internal Medicine

## 2014-06-02 MED ORDER — METRONIDAZOLE 500 MG PO TABS: 500.0000 mg | ORAL_TABLET | Freq: Three times a day (TID) | ORAL | Status: DC

## 2014-06-02 NOTE — Telephone Encounter (Signed)
Have called for notes on Dr. Tami Ribas awaiting return call from nurse.

## 2014-06-02 NOTE — Telephone Encounter (Signed)
Sandy Dr. Tami Ribas nurse called left voicemail stated no notes until dictated to chart that had nothing available.

## 2014-06-02 NOTE — Telephone Encounter (Signed)
Patient called concerned she has seen Dr. Tami Ribas for sinus infection and was started on Z-PAK prednisone and a nasal spray patient is concerned about taking the ABX  Due to HX of C-Diff. Patient would like MD advice as to what she should do?

## 2014-06-02 NOTE — Telephone Encounter (Signed)
If she is not having fevers (T  >100.5),  Blood streaked or green/brown nasal discharge , or ear/facial pain, i would not start the z pack.  If she IS having any of those sympotms  Take a priobiotic twice daily starting the day before and I will add oral flagyl to take along with it

## 2014-06-03 ENCOUNTER — Ambulatory Visit: Payer: Medicare Other | Admitting: Pulmonary Disease

## 2014-06-04 ENCOUNTER — Ambulatory Visit (INDEPENDENT_AMBULATORY_CARE_PROVIDER_SITE_OTHER): Payer: Medicare Other | Admitting: Pulmonary Disease

## 2014-06-04 ENCOUNTER — Encounter: Payer: Self-pay | Admitting: Pulmonary Disease

## 2014-06-04 VITALS — BP 134/68 | HR 68 | Ht 63.0 in | Wt 129.0 lb

## 2014-06-04 DIAGNOSIS — I2510 Atherosclerotic heart disease of native coronary artery without angina pectoris: Secondary | ICD-10-CM | POA: Diagnosis not present

## 2014-06-04 DIAGNOSIS — J438 Other emphysema: Secondary | ICD-10-CM

## 2014-06-04 DIAGNOSIS — J019 Acute sinusitis, unspecified: Secondary | ICD-10-CM | POA: Insufficient documentation

## 2014-06-04 DIAGNOSIS — J018 Other acute sinusitis: Secondary | ICD-10-CM | POA: Diagnosis not present

## 2014-06-04 NOTE — Assessment & Plan Note (Signed)
Stable interval. No evidence of COPD exacerbation.  Plan: -continue current meds -flu shot today -f/u January 2016 and then annualy

## 2014-06-04 NOTE — Assessment & Plan Note (Signed)
She is recovering well from her recent episode of acute sinusitis.  It sounds like this was started by a flare of allergic rhinitis.  PLan: -continue flonase -Neil Med rinses bilaterally -continue daily generic zyrtec

## 2014-06-04 NOTE — Patient Instructions (Signed)
Pick up generic zyrtec to take daily and take it daily for 6 weeks Keep taking Spiriva Keep taking your flonase for the next 6 weeks Get a flu shot next week We will see you back January 2016 or sooner if needed

## 2014-06-04 NOTE — Progress Notes (Signed)
Subjective:    Patient ID: [name], female    DOB: [birth year], 80 y.o.   MRN: [id number]  Synopsis: The a [place] first came to the [facility] pulmonary office in March 2013 for evaluation of COPD. Spirometry at that time showed moderate obstruction. She smoked one half pack of cigarettes daily for 40 years and quit in 1985. Based on her symptoms and spirometry she was listed as gold grade A.  HPI  06/04/14 ROV > [place] had a sinus infectoin recntly and was seen by Dr. Tami Ribas or the same.  He prescribed flonase, prednisone and a zpack.  She decided to not take the zpack because a year ago she had c.diff.  She has persistant sinus congestion at night but she is better.  She is not having shortness of breath, facial pain, no fever.    Past Medical History  Diagnosis Date  . Carotid artery disease     Doppler January, 2012, 40-59% bilateral, followup one year  . Hyperlipidemia   . Hypertension   . SOB (shortness of breath)   . Dizziness     stable now (Oct 2011) patient tells me question of TIA, we will obtain records  . Syncopal episodes     after 18 holes of golf and increase hydrochlorothiazide, resolved   . Indigestion     3 weeks, October 2011  . CAD (coronary artery disease)     Nuclear October, 2011, not gated, no scar or ischemia  . Ejection fraction     EF normal, nuclear, 2008, no echo data as of April 19, 2011  . Rheumatic fever   . History of migraines   . Kidney stones   . GERD (gastroesophageal reflux disease)   . Diverticulitis     last colonoscopy  incomplete Jan 2011  . Hx of CABG     1999  . Gait abnormality     Patient complains of "wobbly gait", December, 2013  . Sinus bradycardia     Mild, December, 2013  . Diffuse cystic mastopathy   . C. difficile colitis       Review of Systems  Constitutional: Negative for fever, chills and unexpected weight change.  HENT: Negative for congestion, postnasal drip, rhinorrhea, sinus pressure and  sneezing.   Respiratory: Negative for cough, choking and shortness of breath.   Cardiovascular: Negative for chest pain and leg swelling.       Objective:   Physical Exam   Filed Vitals:   06/04/14 1511  BP: 134/68  Pulse: 68  Height: 5\' 3"  (1.6 m)  Weight: 129 lb (58.514 kg)  SpO2: 100%   Gen: well appearing, no acute distress HEENT: NCAT, PERRL, EOMi,  PULM: CTA B CV: RRR, slight systolic murmur at base, no JVD AB: BS+, soft, nontender, no hsm Ext: warm, no edema, no clubbing, no cyanosis  Review of 11/2011 CXR PA/Lat (my read): flattened diaphragms, hyperinflated lungs, emphysema noted, no infiltrate or acute pulmonary process  12/08/11 Simple Spirometry: Not an acceptable test due to lots of coughing, but she appears to have obstruction based on the flow volume loop; FEV1 appears to be ~60% predicted      Assessment & Plan:   COPD (chronic obstructive pulmonary disease) Stable interval. No evidence of COPD exacerbation.  Plan: -continue current meds -flu shot today -f/u January 2016 and then annualy  Acute sinusitis She is recovering well from her recent episode of acute sinusitis.  It sounds like this was started by a flare  of allergic rhinitis.  PLan: -continue flonase -Neil Med rinses bilaterally -continue daily generic zyrtec    Updated Medication List Outpatient Encounter Prescriptions as of 06/04/2014  Medication Sig  . amLODipine (NORVASC) 2.5 MG tablet TAKE 1 TABLET EVERY DAY  . aspirin 81 MG tablet Take 81 mg by mouth daily.    . calcium-vitamin D (SM CALCIUM 500/VITAMIN D3) 500-400 MG-UNIT per tablet Take 1 tablet by mouth daily.    . clopidogrel (PLAVIX) 75 MG tablet TAKE 1 TABLET EVERY DAY  . esomeprazole (NEXIUM) 40 MG capsule TAKE 1 CAPSULE DAILY BEFORE BREAKFAST  . losartan (COZAAR) 100 MG tablet TAKE 1 TABLET EVERY DAY  . multivitamin (THERAGRAN) per tablet Take 1 tablet by mouth daily.    . nitroGLYCERIN (NITROSTAT) 0.4 MG SL tablet Place  1 tablet (0.4 mg total) under the tongue every 5 (five) minutes as needed for chest pain.  Marland Kitchen Psyllium-Calcium (METAMUCIL PLUS CALCIUM) CAPS Take 2 capsules by mouth daily as needed.   . simvastatin (ZOCOR) 40 MG tablet Take 1 tablet (40 mg total) by mouth at bedtime.  Marland Kitchen tiotropium (SPIRIVA HANDIHALER) 18 MCG inhalation capsule Place 1 capsule (18 mcg total) into inhaler and inhale daily. NEEDS APPT WITH DR Lake Bells  . [DISCONTINUED] Cyanocobalamin 1000 MCG SUBL Place 1 tablet (1,000 mcg total) under the tongue daily.  . [DISCONTINUED] metroNIDAZOLE (FLAGYL) 500 MG tablet Take 1 tablet (500 mg total) by mouth 3 (three) times daily.

## 2014-06-05 ENCOUNTER — Ambulatory Visit (INDEPENDENT_AMBULATORY_CARE_PROVIDER_SITE_OTHER): Payer: Medicare Other | Admitting: Internal Medicine

## 2014-06-05 ENCOUNTER — Encounter: Payer: Self-pay | Admitting: Internal Medicine

## 2014-06-05 VITALS — BP 140/80 | HR 64 | Temp 98.3°F | Resp 16 | Ht 63.0 in | Wt 127.5 lb

## 2014-06-05 DIAGNOSIS — J0100 Acute maxillary sinusitis, unspecified: Secondary | ICD-10-CM

## 2014-06-05 DIAGNOSIS — I2510 Atherosclerotic heart disease of native coronary artery without angina pectoris: Secondary | ICD-10-CM

## 2014-06-05 MED ORDER — PREDNISONE 10 MG PO TABS
ORAL_TABLET | ORAL | Status: DC
Start: 2014-06-05 — End: 2014-07-07

## 2014-06-05 MED ORDER — MUPIROCIN 2 % EX OINT: 1.0000 "application " | TOPICAL_OINTMENT | Freq: Two times a day (BID) | CUTANEOUS | Status: DC

## 2014-06-05 NOTE — Progress Notes (Signed)
Patient ID: [name], female   DOB: Sep 24, 1925, 80 y.o.   MRN: [id number]   Patient Active Problem List   Diagnosis Date Noted  . Acute sinusitis 06/04/2014  . Diarrhea 12/25/2013  . Colitis due to Clostridium difficile 10/20/2013  . History of acute pancreatitis 10/20/2013  . Lumbago 10/04/2013  . Routine general medical examination at a health care facility 02/07/2013  . Gastritis, chronic 12/26/2011  . COPD (chronic obstructive pulmonary disease) 12/05/2011  . Diverticulitis   . Hx of CABG   . Chronic kidney disease (CKD), stage III (moderate) 09/25/2011  . Insomnia, persistent 09/18/2011  . Hyperlipidemia   . Hypertension   . CAD (coronary artery disease)   . Ejection fraction   . Carotid artery disease     Subjective:  CC:   Chief Complaint  Patient presents with  . Acute Visit    Dr. Tami Ribas last 05/30/14  . Sinusitis    HPI:   [name] is a 80 y.o. female who presents for Persistent left sided maxillary fullness and pressure which is more troublesome with supine position in the evening.   She was treated by Dr. Tami Ribas for sinus infection last week with a prednisone taper,  flonase and a Z pak  but has not started the abx due ot fear of recurrent C dificile colitis.  She reports no improvement in symptoms after taking  the prednisone taper and starting the flonase.  She denies  fevers, purulent sinus drainage, facial or ear pain.     Past Medical History  Diagnosis Date  . Carotid artery disease     Doppler January, 2012, 40-59% bilateral, followup one year  . Hyperlipidemia   . Hypertension   . SOB (shortness of breath)   . Dizziness     stable now (Oct 2011) patient tells me question of TIA, we will obtain records  . Syncopal episodes     after 18 holes of golf and increase hydrochlorothiazide, resolved   . Indigestion     3 weeks, October 2011  . CAD (coronary artery disease)     Nuclear October, 2011, not gated, no scar or ischemia  .  Ejection fraction     EF normal, nuclear, 2008, no echo data as of April 19, 2011  . Rheumatic fever   . History of migraines   . Kidney stones   . GERD (gastroesophageal reflux disease)   . Diverticulitis     last colonoscopy  incomplete Jan 2011  . Hx of CABG     1999  . Gait abnormality     Patient complains of "wobbly gait", December, 2013  . Sinus bradycardia     Mild, December, 2013  . Diffuse cystic mastopathy   . C. difficile colitis     Past Surgical History  Procedure Laterality Date  . Breast biopsy  1970  . Esophagus stretched    . Appendectomy  1950  . Coronary artery bypass graft  1999  . Tonsillectomy  1939  . Total abdominal hysterectomy  1968    due to metrorrhagia  . Cataract extraction Right 2009  . Cataract extraction Left 2011  . Colonoscopy  [id number]    Dr. Jamal Collin       The following portions of the patient's history were reviewed and updated as appropriate: Allergies, current medications, and problem list.    Review of Systems:   Patient denies headache, fevers, malaise, unintentional weight loss, skin rash, eye pain, sinus congestion and sinus  pain, sore throat, dysphagia,  hemoptysis , cough, dyspnea, wheezing, chest pain, palpitations, orthopnea, edema, abdominal pain, nausea, melena, diarrhea, constipation, flank pain, dysuria, hematuria, urinary  Frequency, nocturia, numbness, tingling, seizures,  Focal weakness, Loss of consciousness,  Tremor, insomnia, depression, anxiety, and suicidal ideation.     History   Social History  . Marital Status: Widowed    Spouse Name: N/A    Number of Children: 2  . Years of Education: N/A   Occupational History  .     Social History Main Topics  . Smoking status: Former Smoker -- 0.50 packs/day for 40 years    Types: Cigarettes    Quit date: 09/20/1983  . Smokeless tobacco: Never Used  . Alcohol Use: Yes     Comment: yes, social wine  . Drug Use: No  . Sexual Activity: Not on file   Other  Topics Concern  . Not on file   Social History Narrative  . No narrative on file    Objective:  Filed Vitals:   06/05/14 1014  BP: 140/80  Pulse: 64  Temp: 98.3 F (36.8 C)  Resp: 16     General appearance: alert, cooperative and appears stated age Ears: normal TM's and external ear canals both ears Throat: lips, mucosa, and tongue normal; teeth and gums normal Neck: no adenopathy, no carotid bruit, supple, symmetrical, trachea midline and thyroid not enlarged, symmetric, no tenderness/mass/nodules Back: symmetric, no curvature. ROM normal. No CVA tenderness. Lungs: clear to auscultation bilaterally Heart: regular rate and rhythm, S1, S2 normal, no murmur, click, rub or gallop Abdomen: soft, non-tender; bowel sounds normal; no masses,  no organomegaly Pulses: 2+ and symmetric Skin: Skin color, texture, turgor normal. No rashes or lesions Lymph nodes: Cervical, supraclavicular, and axillary nodes normal.  Assessment and Plan:  Acute sinusitis  Discussed her presentation with Dr Billee Cashing.Elvis Coil.  He did not feel strongly that she would not recover with an antibiotic.  Reassured patient that ,based on her exam she does not need abx.  Recommended to add Afrin bid continue sinus rinses, and repeat prednisone taper   A total of 25 minutes of face to face time was spent with patient more than half of which was spent in counselling and discussing case with specialist  Updated Medication List Outpatient Encounter Prescriptions as of 06/05/2014  Medication Sig  . amLODipine (NORVASC) 2.5 MG tablet TAKE 1 TABLET EVERY DAY  . aspirin 81 MG tablet Take 81 mg by mouth daily.    . calcium-vitamin D (SM CALCIUM 500/VITAMIN D3) 500-400 MG-UNIT per tablet Take 1 tablet by mouth daily.    . clopidogrel (PLAVIX) 75 MG tablet TAKE 1 TABLET EVERY DAY  . esomeprazole (NEXIUM) 40 MG capsule TAKE 1 CAPSULE DAILY BEFORE BREAKFAST  . losartan (COZAAR) 100 MG tablet TAKE 1 TABLET EVERY DAY  .  multivitamin (THERAGRAN) per tablet Take 1 tablet by mouth daily.    . Psyllium-Calcium (METAMUCIL PLUS CALCIUM) CAPS Take 2 capsules by mouth daily as needed.   . simvastatin (ZOCOR) 40 MG tablet Take 1 tablet (40 mg total) by mouth at bedtime.  Marland Kitchen tiotropium (SPIRIVA HANDIHALER) 18 MCG inhalation capsule Place 1 capsule (18 mcg total) into inhaler and inhale daily. NEEDS APPT WITH DR Lake Bells  . mupirocin ointment (BACTROBAN) 2 % Place 1 application into the nose 2 (two) times daily.  . nitroGLYCERIN (NITROSTAT) 0.4 MG SL tablet Place 1 tablet (0.4 mg total) under the tongue every 5 (five) minutes as needed for chest pain.  Marland Kitchen  predniSONE (DELTASONE) 10 MG tablet 6 tablets daily for 3 days,  Then begin to taper by 10 mg daily until gone     No orders of the defined types were placed in this encounter.    No Follow-up on file.

## 2014-06-05 NOTE — Progress Notes (Signed)
Pre-visit discussion using our clinic review tool. No additional management support is needed unless otherwise documented below in the visit note.  

## 2014-06-05 NOTE — Patient Instructions (Addendum)
I do not think you should start the azithromycin  i am prescribing an antibiotic ointment to apply to the inside of your left nostril for 2 weeks  Continue the flonase steroid nasal spray  Add Afrin nasal spray every 12 hours  Until the congestion has resolved  I will repeat the steroid taper ,  But take 60 mg daily fo r the first 3 days, then start tapering by 10 mg daily until gone (8 days total)  Flush left side twice daily with Neil's sinus rinse

## 2014-06-08 NOTE — Assessment & Plan Note (Addendum)
Reassurance provided that based on her exam she does not need abx.  Recommended to add Afrin bid continue sinus rinses, and repeat prednisone taper

## 2014-06-16 DIAGNOSIS — Z23 Encounter for immunization: Secondary | ICD-10-CM | POA: Diagnosis not present

## 2014-06-17 ENCOUNTER — Observation Stay: Payer: Self-pay | Admitting: Internal Medicine

## 2014-06-17 DIAGNOSIS — R5381 Other malaise: Secondary | ICD-10-CM | POA: Diagnosis not present

## 2014-06-17 DIAGNOSIS — R079 Chest pain, unspecified: Secondary | ICD-10-CM | POA: Diagnosis not present

## 2014-06-17 DIAGNOSIS — Z87891 Personal history of nicotine dependence: Secondary | ICD-10-CM | POA: Diagnosis not present

## 2014-06-17 DIAGNOSIS — R110 Nausea: Secondary | ICD-10-CM | POA: Diagnosis not present

## 2014-06-17 DIAGNOSIS — Z79899 Other long term (current) drug therapy: Secondary | ICD-10-CM | POA: Diagnosis not present

## 2014-06-17 DIAGNOSIS — I658 Occlusion and stenosis of other precerebral arteries: Secondary | ICD-10-CM | POA: Diagnosis not present

## 2014-06-17 DIAGNOSIS — N189 Chronic kidney disease, unspecified: Secondary | ICD-10-CM | POA: Diagnosis not present

## 2014-06-17 DIAGNOSIS — I6529 Occlusion and stenosis of unspecified carotid artery: Secondary | ICD-10-CM | POA: Diagnosis not present

## 2014-06-17 DIAGNOSIS — I10 Essential (primary) hypertension: Secondary | ICD-10-CM | POA: Diagnosis not present

## 2014-06-17 DIAGNOSIS — R0789 Other chest pain: Secondary | ICD-10-CM | POA: Diagnosis not present

## 2014-06-17 DIAGNOSIS — K219 Gastro-esophageal reflux disease without esophagitis: Secondary | ICD-10-CM | POA: Diagnosis not present

## 2014-06-17 DIAGNOSIS — J449 Chronic obstructive pulmonary disease, unspecified: Secondary | ICD-10-CM | POA: Diagnosis not present

## 2014-06-17 DIAGNOSIS — I129 Hypertensive chronic kidney disease with stage 1 through stage 4 chronic kidney disease, or unspecified chronic kidney disease: Secondary | ICD-10-CM | POA: Diagnosis not present

## 2014-06-17 DIAGNOSIS — R5383 Other fatigue: Secondary | ICD-10-CM | POA: Diagnosis not present

## 2014-06-17 DIAGNOSIS — R0602 Shortness of breath: Secondary | ICD-10-CM | POA: Diagnosis not present

## 2014-06-17 DIAGNOSIS — J4489 Other specified chronic obstructive pulmonary disease: Secondary | ICD-10-CM | POA: Diagnosis not present

## 2014-06-17 DIAGNOSIS — M79609 Pain in unspecified limb: Secondary | ICD-10-CM | POA: Diagnosis not present

## 2014-06-17 DIAGNOSIS — I2510 Atherosclerotic heart disease of native coronary artery without angina pectoris: Secondary | ICD-10-CM | POA: Diagnosis not present

## 2014-06-17 DIAGNOSIS — I200 Unstable angina: Secondary | ICD-10-CM | POA: Diagnosis not present

## 2014-06-17 DIAGNOSIS — Z7902 Long term (current) use of antithrombotics/antiplatelets: Secondary | ICD-10-CM | POA: Diagnosis not present

## 2014-06-17 DIAGNOSIS — Z951 Presence of aortocoronary bypass graft: Secondary | ICD-10-CM | POA: Diagnosis not present

## 2014-06-17 LAB — COMPREHENSIVE METABOLIC PANEL
ALBUMIN: 2.9 g/dL — AB (ref 3.4–5.0)
Alkaline Phosphatase: 71 U/L
Anion Gap: 7 (ref 7–16)
BUN: 22 mg/dL — ABNORMAL HIGH (ref 7–18)
Bilirubin,Total: 0.7 mg/dL (ref 0.2–1.0)
CHLORIDE: 107 mmol/L (ref 98–107)
CREATININE: 1.42 mg/dL — AB (ref 0.60–1.30)
Calcium, Total: 8.2 mg/dL — ABNORMAL LOW (ref 8.5–10.1)
Co2: 28 mmol/L (ref 21–32)
EGFR (Non-African Amer.): 37 — ABNORMAL LOW
GFR CALC AF AMER: 45 — AB
GLUCOSE: 115 mg/dL — AB (ref 65–99)
Osmolality: 287 (ref 275–301)
Potassium: 4.3 mmol/L (ref 3.5–5.1)
SGOT(AST): 23 U/L (ref 15–37)
SGPT (ALT): 32 U/L
SODIUM: 142 mmol/L (ref 136–145)
Total Protein: 5.6 g/dL — ABNORMAL LOW (ref 6.4–8.2)

## 2014-06-17 LAB — CBC
HCT: 40.6 % (ref 35.0–47.0)
HGB: 13.5 g/dL (ref 12.0–16.0)
MCH: 32.5 pg (ref 26.0–34.0)
MCHC: 33.3 g/dL (ref 32.0–36.0)
MCV: 98 fL (ref 80–100)
Platelet: 139 10*3/uL — ABNORMAL LOW (ref 150–440)
RBC: 4.16 10*6/uL (ref 3.80–5.20)
RDW: 13.3 % (ref 11.5–14.5)
WBC: 6 10*3/uL (ref 3.6–11.0)

## 2014-06-17 LAB — APTT: ACTIVATED PTT: 23.3 s — AB (ref 23.6–35.9)

## 2014-06-17 LAB — PROTIME-INR
INR: 1
Prothrombin Time: 12.7 secs (ref 11.5–14.7)

## 2014-06-17 LAB — TROPONIN I: Troponin-I: <0.02 ng/mL

## 2014-06-17 LAB — CK TOTAL AND CKMB (NOT AT ARMC)
CK, Total: 23 U/L — ABNORMAL LOW
CK, Total: 33 U/L
CK, Total: 77 U/L
CK-MB: <0.5 ng/mL — ABNORMAL LOW (ref 0.5–3.6)
CK-MB: 1.1 ng/mL (ref 0.5–3.6)

## 2014-06-17 LAB — PRO B NATRIURETIC PEPTIDE: B-TYPE NATIURETIC PEPTID: 653 pg/mL — AB (ref 0–450)

## 2014-06-18 ENCOUNTER — Telehealth: Payer: Self-pay | Admitting: Internal Medicine

## 2014-06-18 DIAGNOSIS — I200 Unstable angina: Secondary | ICD-10-CM | POA: Diagnosis not present

## 2014-06-18 DIAGNOSIS — I2510 Atherosclerotic heart disease of native coronary artery without angina pectoris: Secondary | ICD-10-CM | POA: Diagnosis not present

## 2014-06-18 DIAGNOSIS — R079 Chest pain, unspecified: Secondary | ICD-10-CM | POA: Diagnosis not present

## 2014-06-18 DIAGNOSIS — I059 Rheumatic mitral valve disease, unspecified: Secondary | ICD-10-CM | POA: Diagnosis not present

## 2014-06-18 DIAGNOSIS — I10 Essential (primary) hypertension: Secondary | ICD-10-CM | POA: Diagnosis not present

## 2014-06-18 DIAGNOSIS — J449 Chronic obstructive pulmonary disease, unspecified: Secondary | ICD-10-CM | POA: Diagnosis not present

## 2014-06-18 LAB — LIPID PANEL
Cholesterol: 143 mg/dL (ref 0–200)
Cholesterol: 143 mg/dL (ref 0–200)
HDL Cholesterol: 46 mg/dL (ref 40–60)
HDL: 46 mg/dL (ref 35–70)
LDL Cholesterol: 72 mg/dL
Ldl Cholesterol, Calc: 72 mg/dL (ref 0–100)
TRIGLYCERIDES: 126 mg/dL (ref 0–200)
Triglycerides: 126 mg/dL (ref 40–160)
VLDL Cholesterol, Calc: 25 mg/dL (ref 5–40)

## 2014-06-18 LAB — BASIC METABOLIC PANEL
Anion Gap: 5 — ABNORMAL LOW (ref 7–16)
BUN: 23 mg/dL — AB (ref 7–18)
CO2: 31 mmol/L (ref 21–32)
CREATININE: 1.38 mg/dL — AB (ref 0.60–1.30)
Calcium, Total: 8.1 mg/dL — ABNORMAL LOW (ref 8.5–10.1)
Chloride: 107 mmol/L (ref 98–107)
Creatinine: 1.4 mg/dL — AB (ref 0.5–1.1)
EGFR (Non-African Amer.): 39 — ABNORMAL LOW
GFR CALC AF AMER: 47 — AB
Glucose: 109 mg/dL
Glucose: 109 mg/dL — ABNORMAL HIGH (ref 65–99)
OSMOLALITY: 289 (ref 275–301)
Potassium: 4.4 mmol/L (ref 3.4–5.3)
Potassium: 4.4 mmol/L (ref 3.5–5.1)
SODIUM: 143 mmol/L (ref 137–147)
SODIUM: 143 mmol/L (ref 136–145)

## 2014-06-18 LAB — HEPATIC FUNCTION PANEL
ALT: 32 U/L (ref 7–35)
AST: 23 U/L (ref 13–35)
Alkaline Phosphatase: 71 U/L (ref 25–125)
Bilirubin, Total: 0.7 mg/dL

## 2014-06-18 LAB — CBC AND DIFFERENTIAL
Hemoglobin: 13.5 g/dL (ref 12.0–16.0)
PLATELETS: 139 10*3/uL — AB (ref 150–399)
WBC: 6 10^3/mL

## 2014-06-18 NOTE — Telephone Encounter (Signed)
Pt needs HFU visit for chest pain. Please advise where to add pt to [email or web address]

## 2014-06-18 NOTE — Telephone Encounter (Signed)
Next available spot is 06/30/14  At 5.00 and 515 spot ok to use.

## 2014-06-18 NOTE — Telephone Encounter (Signed)
You can change her to 4:30 on October 6 but I need to know when patient was discharged.  Please ask any staff that takes a message about hospital discharges, to find out when they were discharged!! I need a date

## 2014-06-18 NOTE — Telephone Encounter (Signed)
Pt was notified of date/time by [facility]

## 2014-06-20 ENCOUNTER — Telehealth: Payer: Self-pay | Admitting: *Deleted

## 2014-06-20 NOTE — Telephone Encounter (Signed)
Discharge date: 06/18/14  Transition Care Management Follow-up Telephone Call  How have you been since you were released from the hospital? Pt states "doing good today, no pain"    Do you understand why you were in the hospital? YES   Do you understand the discharge instrcutions? YES  Items Reviewed:  Medications reviewed: YES,  New medication: Imdur 30mg , 1xdaily   Allergies reviewed: NO  Dietary changes reviewed: YES - low sodium, low fat, low cholesterol   Referrals reviewed: N/A   Functional Questionnaire:   Activities of Daily Living (ADLs):  N/A  She states they are independent in the following:  States they require assistance with the following:   Any transportation issues/concerns?: NO   Any patient concerns? NO   Confirmed importance and date/time of follow-up visits scheduled: YES, Oct 6th at 4:30    Confirmed with patient if condition begins to worsen call PCP or go to the ER.  Patient was given the Call-a-Nurse line [phone]: YES

## 2014-06-23 ENCOUNTER — Encounter: Payer: Self-pay | Admitting: Cardiology

## 2014-06-24 ENCOUNTER — Ambulatory Visit (INDEPENDENT_AMBULATORY_CARE_PROVIDER_SITE_OTHER): Payer: Medicare Other | Admitting: Internal Medicine

## 2014-06-24 ENCOUNTER — Encounter: Payer: Self-pay | Admitting: Internal Medicine

## 2014-06-24 VITALS — BP 140/60 | HR 63 | Temp 98.2°F | Resp 14 | Ht 63.0 in | Wt 129.8 lb

## 2014-06-24 DIAGNOSIS — K2950 Unspecified chronic gastritis without bleeding: Secondary | ICD-10-CM

## 2014-06-24 DIAGNOSIS — I2510 Atherosclerotic heart disease of native coronary artery without angina pectoris: Secondary | ICD-10-CM

## 2014-06-24 DIAGNOSIS — I700 Atherosclerosis of aorta: Secondary | ICD-10-CM

## 2014-06-24 MED ORDER — NITROGLYCERIN 0.4 MG SL SUBL: 0.4000 mg | SUBLINGUAL_TABLET | SUBLINGUAL | Status: DC | PRN

## 2014-06-24 MED ORDER — HYOSCYAMINE SULFATE 0.125 MG SL SUBL: 0.1250 mg | SUBLINGUAL_TABLET | SUBLINGUAL | Status: DC | PRN

## 2014-06-24 MED ORDER — ALPRAZOLAM 0.25 MG PO TABS: 0.2500 mg | ORAL_TABLET | Freq: Every evening | ORAL | Status: DC | PRN

## 2014-06-24 NOTE — Patient Instructions (Signed)
Your chest pain MIGHT be coming from esophagitis,   Take a second dose of OTC Nexium in the evecning   Try the hyoscyamine sublingual tablet the next time you have the chest pain.  If it doew not help,  Take a nitroglycerin   You can use the alprazolam to help you fall sleep.  Do NOT drink alcohol at the same time

## 2014-06-24 NOTE — Progress Notes (Addendum)
Patient ID: [name], female   DOB: 1926-06-07, 80 y.o.   MRN: [id number]  Patient Active Problem List   Diagnosis Date Noted  . Atherosclerosis of aorta 06/26/2014  . Colitis due to Clostridium difficile 10/20/2013  . History of acute pancreatitis 10/20/2013  . Lumbago 10/04/2013  . Routine general medical examination at a health care facility 02/07/2013  . Chest pain at rest 12/26/2011  . COPD (chronic obstructive pulmonary disease) 12/05/2011  . Hx of CABG   . Chronic kidney disease (CKD), stage III (moderate) 09/25/2011  . Insomnia, persistent 09/18/2011  . Hyperlipidemia   . Hypertension   . CAD (coronary artery disease)   . Ejection fraction   . Carotid artery disease     Subjective:  CC:   Chief Complaint  Patient presents with  . Follow-up    Hospital for chest pain. was prescribed IMdur 30 mg in hospita l patient took for 3 days and stopped  made her light hheaded.    HPI:   [name] is a 80 y.o. female who presents for  Hospital follow up,  Patient was admitted to [facility] on Sept 29 and discharged on Sept 30th with unstable angina. She underwent a Myoview which was negative,  And Imdur 30 mg daily was added , which she has not tolerated due to dizziness.  She continues to have daily episodes of mild substernal chest pain which occur at rest  And are not accompanied by nausea , diaphoresis, or dyspnea.   ECHO was done with EF 60 to [id number] and diastolic dysfunction noted.     Past Medical History  Diagnosis Date  . Carotid artery disease     Doppler January, 2012, 40-59% bilateral, followup one year  . Hyperlipidemia   . Hypertension   . SOB (shortness of breath)   . Dizziness     stable now (Oct 2011) patient tells me question of TIA, we will obtain records  . Syncopal episodes     after 18 holes of golf and increase hydrochlorothiazide, resolved   . Indigestion     3 weeks, October 2011  . CAD (coronary artery disease)     Nuclear October, 2011,  not gated, no scar or ischemia  . Ejection fraction     EF normal, nuclear, 2008, no echo data as of April 19, 2011  . Rheumatic fever   . History of migraines   . Kidney stones   . GERD (gastroesophageal reflux disease)   . Diverticulitis     last colonoscopy  incomplete Jan 2011  . Hx of CABG     1999  . Gait abnormality     Patient complains of "wobbly gait", December, 2013  . Sinus bradycardia     Mild, December, 2013  . Diffuse cystic mastopathy   . C. difficile colitis     Past Surgical History  Procedure Laterality Date  . Breast biopsy  1970  . Esophagus stretched    . Appendectomy  1950  . Coronary artery bypass graft  1999  . Tonsillectomy  1939  . Total abdominal hysterectomy  1968    due to metrorrhagia  . Cataract extraction Right 2009  . Cataract extraction Left 2011  . Colonoscopy  [id number]    Dr. Jamal Collin       The following portions of the patient's history were reviewed and updated as appropriate: Allergies, current medications, and problem list.    Review of Systems:   Patient denies headache, fevers,  malaise, unintentional weight loss, skin rash, eye pain, sinus congestion and sinus pain, sore throat, dysphagia,  hemoptysis , cough, dyspnea, wheezing, chest pain, palpitations, orthopnea, edema, abdominal pain, nausea, melena, diarrhea, constipation, flank pain, dysuria, hematuria, urinary  Frequency, nocturia, numbness, tingling, seizures,  Focal weakness, Loss of consciousness,  Tremor, insomnia, depression, anxiety, and suicidal ideation.     History   Social History  . Marital Status: Married    Spouse Name: N/A    Number of Children: 2  . Years of Education: N/A   Occupational History  .     Social History Main Topics  . Smoking status: Former Smoker -- 0.50 packs/day for 40 years    Types: Cigarettes    Quit date: 09/20/1983  . Smokeless tobacco: Never Used  . Alcohol Use: Yes     Comment: yes, social wine  . Drug Use: No  .  Sexual Activity: Not on file   Other Topics Concern  . Not on file   Social History Narrative  . No narrative on file    Objective:  Filed Vitals:   06/24/14 1640  BP: 140/60  Pulse: 63  Temp: 98.2 F (36.8 C)  Resp: 14     General appearance: alert, cooperative and appears stated age Ears: normal TM's and external ear canals both ears Throat: lips, mucosa, and tongue normal; teeth and gums normal Neck: no adenopathy, no carotid bruit, supple, symmetrical, trachea midline and thyroid not enlarged, symmetric, no tenderness/mass/nodules Back: symmetric, no curvature. ROM normal. No CVA tenderness. Lungs: clear to auscultation bilaterally Heart: regular rate and rhythm, S1, S2 normal, no murmur, click, rub or gallop Abdomen: soft, non-tender; bowel sounds normal; no masses,  no organomegaly Pulses: 2+ and symmetric Skin: Skin color, texture, turgor normal. No rashes or lesions Lymph nodes: Cervical, supraclavicular, and axillary nodes normal.  Assessment and Plan:  Chest pain at rest Discussed aggressive treatment of chronic gastritis/esophagitis with nexium bid (which she was prescribecd aat dc but not doing) and trial of levsin.   CAD (coronary artery disease) Recent admission for [place] included negative toponin x 3,  Normal Myoview and normal ECHO  Except for diastolic dysfunction. Stopping Imdur due to intolerance.  Continue zocor,  ASA, ARB .  Not on beta blocker due to COPD . Marland Kitchen   Atherosclerosis of aorta Small layered placque noted on ascending aorta  On recent ECHO done during admission.  Continue statin and ASA     Updated Medication List Outpatient Encounter Prescriptions as of 06/24/2014  Medication Sig  . amLODipine (NORVASC) 2.5 MG tablet TAKE 1 TABLET EVERY DAY  . aspirin 81 MG tablet Take 81 mg by mouth daily.    . calcium-vitamin D (SM CALCIUM 500/VITAMIN D3) 500-400 MG-UNIT per tablet Take 1 tablet by mouth daily.    . clopidogrel (PLAVIX) 75 MG tablet  TAKE 1 TABLET EVERY DAY  . esomeprazole (NEXIUM) 40 MG capsule TAKE 1 CAPSULE DAILY BEFORE BREAKFAST  . losartan (COZAAR) 100 MG tablet TAKE 1 TABLET EVERY DAY  . multivitamin (THERAGRAN) per tablet Take 1 tablet by mouth daily.    . mupirocin ointment (BACTROBAN) 2 % Place 1 application into the nose 2 (two) times daily.  . nitroGLYCERIN (NITROSTAT) 0.4 MG SL tablet Place 1 tablet (0.4 mg total) under the tongue every 5 (five) minutes as needed for chest pain.  . predniSONE (DELTASONE) 10 MG tablet 6 tablets daily for 3 days,  Then begin to taper by 10 mg daily until gone  .  Psyllium-Calcium (METAMUCIL PLUS CALCIUM) CAPS Take 2 capsules by mouth daily as needed.   . simvastatin (ZOCOR) 40 MG tablet Take 1 tablet (40 mg total) by mouth at bedtime.  Marland Kitchen tiotropium (SPIRIVA HANDIHALER) 18 MCG inhalation capsule Place 1 capsule (18 mcg total) into inhaler and inhale daily. NEEDS APPT WITH DR Lake Bells  . [DISCONTINUED] nitroGLYCERIN (NITROSTAT) 0.4 MG SL tablet Place 1 tablet (0.4 mg total) under the tongue every 5 (five) minutes as needed for chest pain.  Marland Kitchen ALPRAZolam (XANAX) 0.25 MG tablet Take 1 tablet (0.25 mg total) by mouth at bedtime as needed for anxiety.  . hyoscyamine (LEVSIN SL) 0.125 MG SL tablet Place 1 tablet (0.125 mg total) under the tongue every 4 (four) hours as needed.  . isosorbide mononitrate (IMDUR) 30 MG 24 hr tablet Take 30 mg by mouth daily.     Orders Placed This Encounter  Procedures  . CBC and differential  . Basic metabolic panel  . Lipid panel  . Hepatic function panel    No Follow-up on file.

## 2014-06-26 ENCOUNTER — Encounter: Payer: Self-pay | Admitting: Internal Medicine

## 2014-06-26 DIAGNOSIS — I700 Atherosclerosis of aorta: Secondary | ICD-10-CM | POA: Insufficient documentation

## 2014-06-26 NOTE — Assessment & Plan Note (Signed)
Small layered placque noted on ascending aorta  On recent ECHO done during admission.  Continue statin and ASA

## 2014-06-26 NOTE — Assessment & Plan Note (Addendum)
Recent admission for [place] included negative toponin x 3,  Normal Myoview and normal ECHO  Except for diastolic dysfunction. Stopping Imdur due to intolerance.  Continue zocor,  ASA, ARB .  Not on beta blocker due to COPD . [name]

## 2014-06-26 NOTE — Assessment & Plan Note (Signed)
Discussed aggressive treatment of chronic gastritis/esophagitis with nexium bid (which she was prescribecd aat dc but not doing) and trial of levsin.

## 2014-07-01 ENCOUNTER — Encounter: Payer: Medicare Other | Admitting: Physician Assistant

## 2014-07-02 ENCOUNTER — Encounter: Payer: Medicare Other | Admitting: Physician Assistant

## 2014-07-06 DIAGNOSIS — R05 Cough: Secondary | ICD-10-CM | POA: Diagnosis not present

## 2014-07-07 ENCOUNTER — Ambulatory Visit (INDEPENDENT_AMBULATORY_CARE_PROVIDER_SITE_OTHER): Payer: Medicare Other | Admitting: Physician Assistant

## 2014-07-07 ENCOUNTER — Encounter: Payer: Self-pay | Admitting: Physician Assistant

## 2014-07-07 VITALS — BP 118/60 | HR 68 | Ht 63.5 in | Wt 132.5 lb

## 2014-07-07 DIAGNOSIS — I2510 Atherosclerotic heart disease of native coronary artery without angina pectoris: Secondary | ICD-10-CM | POA: Diagnosis not present

## 2014-07-07 DIAGNOSIS — R079 Chest pain, unspecified: Secondary | ICD-10-CM

## 2014-07-07 DIAGNOSIS — E785 Hyperlipidemia, unspecified: Secondary | ICD-10-CM

## 2014-07-07 DIAGNOSIS — I208 Other forms of angina pectoris: Secondary | ICD-10-CM | POA: Diagnosis not present

## 2014-07-07 DIAGNOSIS — I779 Disorder of arteries and arterioles, unspecified: Secondary | ICD-10-CM | POA: Diagnosis not present

## 2014-07-07 DIAGNOSIS — I739 Peripheral vascular disease, unspecified: Secondary | ICD-10-CM

## 2014-07-07 NOTE — Patient Instructions (Addendum)
Your physician recommends that you continue on your current medications as directed. Please refer to the Current Medication list given to you today.  Please follow up with Dr. Rockey Situ in 3 months  Your next appointment will be scheduled in our new office located at :  [place]  [address] [place]  [place], [place] [address]

## 2014-07-07 NOTE — Progress Notes (Signed)
Patient Name: [name], [name] [birth year], MRN [id number]  Date of Encounter: 07/07/2014  Primary Care Provider:  Deborra Medina, MD Primary Cardiologist:  Dr. Rockey Situ, MD  Patient Profile:  80 y.o. female with history below who presents for hospital follow after her admission to [facility] from 9/29-9/30 for unstable angina.     Problem List:   Past Medical History  Diagnosis Date  . Carotid artery disease     Doppler January, 2012, 40-59% bilateral, followup one year  . Hyperlipidemia   . Hypertension   . SOB (shortness of breath)   . Dizziness     stable now (Oct 2011) patient tells me question of TIA, we will obtain records  . Syncopal episodes     after 18 holes of golf and increase hydrochlorothiazide, resolved   . Indigestion     3 weeks, October 2011  . CAD (coronary artery disease)     a. Nuclear 10/11, not gated, no scar or ischemia, EF 68%, low risk study; b. [place] 06/18/14: no evidence of infarction or ischemia, no WMA, normalization of TWI in precordial leads, equivocal EKG changes EF 77%, low risk study  . Rheumatic fever   . History of migraines   . Kidney stones   . GERD (gastroesophageal reflux disease)   . Diverticulitis     last colonoscopy  incomplete Jan 2011  . Hx of CABG     a. 3 vessel in 1999  . Gait abnormality     Patient complains of "wobbly gait", December, 2013  . Sinus bradycardia     Mild, December, 2013  . Diffuse cystic mastopathy   . C. difficile colitis    Past Surgical History  Procedure Laterality Date  . Breast biopsy  1970  . Esophagus stretched    . Appendectomy  1950  . Coronary artery bypass graft  1999  . Tonsillectomy  1939  . Total abdominal hysterectomy  1968    due to metrorrhagia  . Cataract extraction Right 2009  . Cataract extraction Left 2011  . Colonoscopy  [id number]    Dr. Jamal Collin     Allergies:  Allergies  Allergen Reactions  . Codeine Nausea And Vomiting     HPI:  80 y.o. female  with the above problem list who presents for hospital follow up.   Patient with history of CAD s/p CABG 1999 without cardiac intervention - including catheterizations/stenting since. Prior to her admission on 06/17/14 she last underwent cardiac diagnostic evaluation in October 2011 which showed no evidence of ischemia by perfusion, no EKG changes of ischemia, EF 68%, low risk study. She last saw Dr. Rockey Situ, MD 03/14/2014 and reported increasing angina while supine at that time. She denied any symptoms during the day or with exertion. She described 5/10 discomfort at that time and denied any changes to her daily routine, including exertional. Her prior anginal symptoms related to CABG included SOB with exertion. After a long discussion with the patient it was decided to continue with watchful waiting. She called back a month later (7/24) with a "funny" feeling in her chest that radiated to her axilla with associated dizziness. At the same time she reported chronic C diff and dehydration as well. She took some meclizine at that time - symptoms resolved over the next couple of days. She followed up with PCP who suspected BPPV. She has had recent bouts of URI/sinusitis in early September.   She presented to [facility] on 9/29 with substernal chest pain, nausea,  and SOB (anginal equivalent). SHe had noted left arm pain for the prior 1 1/2 weeks leading up to her admission. TnI were negative x 3. She underwent echo that showed EF 60-65%, Impaired relaxation pattern of LV diastolic filling, borderline enlarged left atrium, mild MR, mild aortic sclerosis without stenosis, small amount of plaque involving the ascending aorta. She also underwent a Lexiscan Myoview that did not show any infarction or ischemia, no significant WMA, normalization of TWI in precordial leads, equivocal EKG changes, EF 77%, low risk study. She was continued on optimal medical therapy.   Today she states that she feels well. Her Imdur was discontinued  by her PCP on 10/6 secondary to hypotension. She has had a couple of short lived episodes of chest pain when laying down. Pain is much improved from prior. She has not yet started her PPI that she was Rx'd at discharge. Pain is not exertional. No associated symptoms. She prefers watchful waiting. She does have a slight cough that is dry. She saw an urgent care the prior day - negative CXR. Was Rx'd cough medication and abx. She does not want to take either. Prefers watchful waiting. Weight is slightly up today. Discharge weight of 125.     Home Medications:  Prior to Admission medications   Medication Sig Start Date End Date Taking? Authorizing Provider  ALPRAZolam (XANAX) 0.25 MG tablet Take 1 tablet (0.25 mg total) by mouth at bedtime as needed for anxiety. 06/24/14   [name], MD  amLODipine (NORVASC) 2.5 MG tablet TAKE 1 TABLET EVERY DAY 02/04/14   [name], MD  aspirin 81 MG tablet Take 81 mg by mouth daily.      Historical Provider, MD  calcium-vitamin D (SM CALCIUM 500/VITAMIN D3) 500-400 MG-UNIT per tablet Take 1 tablet by mouth daily.      Historical Provider, MD  clopidogrel (PLAVIX) 75 MG tablet TAKE 1 TABLET EVERY DAY 05/30/14   [name], MD  esomeprazole (NEXIUM) 40 MG capsule TAKE 1 CAPSULE DAILY BEFORE BREAKFAST 05/09/13   [name], MD  hyoscyamine (LEVSIN SL) 0.125 MG SL tablet Place 1 tablet (0.125 mg total) under the tongue every 4 (four) hours as needed. 06/24/14   [name], MD  isosorbide mononitrate (IMDUR) 30 MG 24 hr tablet Take 30 mg by mouth daily. 06/18/14   Historical Provider, MD  losartan (COZAAR) 100 MG tablet TAKE 1 TABLET EVERY DAY    [name], MD  multivitamin [address]) per tablet Take 1 tablet by mouth daily.      Historical Provider, MD  mupirocin ointment (BACTROBAN) 2 % Place 1 application into the nose 2 (two) times daily. 06/05/14   [name], MD  nitroGLYCERIN (NITROSTAT) 0.4 MG SL tablet Place 1 tablet (0.4 mg total)  under the tongue every 5 (five) minutes as needed for chest pain. 06/24/14   [name], MD  predniSONE (DELTASONE) 10 MG tablet 6 tablets daily for 3 days,  Then begin to taper by 10 mg daily until gone 06/05/14   [name], MD  Psyllium-Calcium (METAMUCIL PLUS CALCIUM) CAPS Take 2 capsules by mouth daily as needed.     Historical Provider, MD  simvastatin (ZOCOR) 40 MG tablet Take 1 tablet (40 mg total) by mouth at bedtime. 09/26/13   Minna Merritts, MD  tiotropium (SPIRIVA HANDIHALER) 18 MCG inhalation capsule Place 1 capsule (18 mcg total) into inhaler and inhale daily. NEEDS APPT WITH DR [place] Bells 04/11/14  Juanito Doom, MD     Weights: Filed Weights   07/07/14 1424  Weight: 132 lb 8 oz (60.102 kg)     Review of Systems:  All other systems reviewed and are otherwise negative except as noted above.  Physical Exam:  Blood pressure 118/60, pulse 68, height 5' 3.5" (1.613 m), weight 132 lb 8 oz (60.102 kg).  General: Pleasant, NAD Psych: Normal affect. Neuro: Alert and oriented X 3. Moves all extremities spontaneously. HEENT: Normal  Neck: Supple without JVD. Bilateral bruits.  Lungs:  Resp regular and unlabored, CTA. Heart: RRR no s3, s4, or murmurs. Abdomen: Soft, non-tender, non-distended, BS + x 4.  Extremities: No clubbing, cyanosis or edema. DP/PT/Radials 2+ and equal bilaterally.   Accessory Clinical Findings:  EKG - NSR, 68, low voltage, old TWI V2-V5  Assessment & Plan:  1. CAD s/p 3 v CABG in 1999: -Echo 05/2014 with EF 60-65%, impaired relaxation pattern of LV diastolic filling, borderline enlarged left atrium, mild MR, mild aortic sclerosis without stenosis, small amount of plaque involving the ascending aorta.  -Lexiscan Myoview 05/2014 did not show any infarction or ischemia, no significant WMA, normalization of TWI in precordial leads, equivocal EKG changes, EF 77%, low risk study. -Continue aspirin, Plavix, losartan, amlodipine, simvastatin - not on  b-blocker 2/2 COPD  2. Angina: -She has had a couple of short lived episodes while laying down lasting only a short while before self resolving. She prefers watchful waiting at this time.  -Trial of PPI  -Her Imdur was stopped by her PCP on 10/6 secondary to hypotension  -Has SL NTG for prn usage   3. HTN: -Well controlled  -Continue current medications  4. HLD: -TC 143, LDL 72, HDL 46, TG 126 while inpatient, goal LDL <70 -Continue current medication   5. Cough: -CXR done 10/18 without reported PNA - lungs CTAB -No LEE, weight is slightly up today - will monitor -Likely viral in etiology, follow up with PCP if needed -May take Delsym prn  6. COPD: -Inhalers per primary -Not on b-blocker  7. Dispo: -Follow up with Dr. Rockey Situ, MD 3 months   Christell Faith, PA-C [address] [facility], [place] [address] [phone] [place] 07/07/2014, 3:30 PM

## 2014-07-09 ENCOUNTER — Ambulatory Visit (INDEPENDENT_AMBULATORY_CARE_PROVIDER_SITE_OTHER): Payer: Medicare Other | Admitting: Internal Medicine

## 2014-07-09 ENCOUNTER — Encounter: Payer: Self-pay | Admitting: Internal Medicine

## 2014-07-09 ENCOUNTER — Ambulatory Visit: Payer: Self-pay | Admitting: Internal Medicine

## 2014-07-09 VITALS — BP 124/68 | HR 81 | Temp 98.4°F | Resp 16 | Ht 63.5 in | Wt 132.5 lb

## 2014-07-09 DIAGNOSIS — I2510 Atherosclerotic heart disease of native coronary artery without angina pectoris: Secondary | ICD-10-CM | POA: Diagnosis not present

## 2014-07-09 DIAGNOSIS — J441 Chronic obstructive pulmonary disease with (acute) exacerbation: Secondary | ICD-10-CM | POA: Diagnosis not present

## 2014-07-09 DIAGNOSIS — K449 Diaphragmatic hernia without obstruction or gangrene: Secondary | ICD-10-CM | POA: Diagnosis not present

## 2014-07-09 DIAGNOSIS — R05 Cough: Secondary | ICD-10-CM | POA: Diagnosis not present

## 2014-07-09 DIAGNOSIS — Z951 Presence of aortocoronary bypass graft: Secondary | ICD-10-CM | POA: Diagnosis not present

## 2014-07-09 DIAGNOSIS — R059 Cough, unspecified: Secondary | ICD-10-CM

## 2014-07-09 LAB — CBC WITH DIFFERENTIAL/PLATELET
BASOS ABS: 0.1 10*3/uL (ref 0.0–0.1)
BASOS PCT: 0.7 % (ref 0.0–3.0)
EOS ABS: 0.3 10*3/uL (ref 0.0–0.7)
Eosinophils Relative: 3.2 % (ref 0.0–5.0)
HCT: 39.7 % (ref 36.0–46.0)
Hemoglobin: 13 g/dL (ref 12.0–15.0)
LYMPHS PCT: 14.1 % (ref 12.0–46.0)
Lymphs Abs: 1.5 10*3/uL (ref 0.7–4.0)
MCHC: 32.7 g/dL (ref 30.0–36.0)
MCV: 97.7 fl (ref 78.0–100.0)
Monocytes Absolute: 1.4 10*3/uL — ABNORMAL HIGH (ref 0.1–1.0)
Monocytes Relative: 13.1 % — ABNORMAL HIGH (ref 3.0–12.0)
NEUTROS PCT: 68.9 % (ref 43.0–77.0)
Neutro Abs: 7.3 10*3/uL (ref 1.4–7.7)
Platelets: 214 10*3/uL (ref 150.0–400.0)
RBC: 4.07 Mil/uL (ref 3.87–5.11)
RDW: 12.9 % (ref 11.5–15.5)
WBC: 10.6 10*3/uL — AB (ref 4.0–10.5)

## 2014-07-09 MED ORDER — BENZONATATE 200 MG PO CAPS: 200.0000 mg | ORAL_CAPSULE | Freq: Three times a day (TID) | ORAL | Status: DC | PRN

## 2014-07-09 MED ORDER — PREDNISONE (PAK) 10 MG PO TABS: ORAL_TABLET | ORAL | Status: DC

## 2014-07-09 NOTE — Progress Notes (Signed)
Patient ID: [name], female   DOB: 02/05/1926, 80 y.o.   MRN: [id number]  Patient Active Problem List   Diagnosis Date Noted  . COPD exacerbation 07/12/2014  . Atherosclerosis of aorta 06/26/2014  . Colitis due to Clostridium difficile 10/20/2013  . History of acute pancreatitis 10/20/2013  . Lumbago 10/04/2013  . Routine general medical examination at a health care facility 02/07/2013  . Chest pain at rest 12/26/2011  . COPD (chronic obstructive pulmonary disease) 12/05/2011  . Hx of CABG   . Chronic kidney disease (CKD), stage III (moderate) 09/25/2011  . Insomnia, persistent 09/18/2011  . Hyperlipidemia   . Hypertension   . CAD (coronary artery disease)   . Ejection fraction   . Carotid artery disease     Subjective:  CC:   Chief Complaint  Patient presents with  . Cough    x 9 to 10 days mucus yellowish but sometimes clear. denies bodyache or chills and afebrile.    HPI:   [name] is a 80 y.o. female who presents for  10 day history of hacking cough,  No fever, sore throat or body aches.  Was evaluated at Urgent care on 10/18 and prescribed levaquin but  Has not taken it due to fear of recurrent c dificile colitis.  Cough is nonproductive.    Past Medical History  Diagnosis Date  . Carotid artery disease     Doppler January, 2012, 40-59% bilateral, followup one year  . Hyperlipidemia   . Hypertension   . SOB (shortness of breath)   . Dizziness     stable now (Oct 2011) patient tells me question of TIA, we will obtain records  . Syncopal episodes     after 18 holes of golf and increase hydrochlorothiazide, resolved   . Indigestion     3 weeks, October 2011  . CAD (coronary artery disease)     a. Nuclear 10/11, not gated, no scar or ischemia, EF 68%, low risk study; b. [place] 06/18/14: no evidence of infarction or ischemia, no WMA, normalization of TWI in precordial leads, equivocal EKG changes EF 77%, low risk study  . Rheumatic  fever   . History of migraines   . Kidney stones   . GERD (gastroesophageal reflux disease)   . Diverticulitis     last colonoscopy  incomplete Jan 2011  . Hx of CABG     a. 3 vessel in 1999  . Gait abnormality     Patient complains of "wobbly gait", December, 2013  . Sinus bradycardia     Mild, December, 2013  . Diffuse cystic mastopathy   . C. difficile colitis     Past Surgical History  Procedure Laterality Date  . Breast biopsy  1970  . Esophagus stretched    . Appendectomy  1950  . Coronary artery bypass graft  1999  . Tonsillectomy  1939  . Total abdominal hysterectomy  1968    due to metrorrhagia  . Cataract extraction Right 2009  . Cataract extraction Left 2011  . Colonoscopy  [id number]    Dr. Jamal Collin       The following portions of the patient's history were reviewed and updated as appropriate: Allergies, current medications, and problem list.    Review of Systems:   Patient denies headache, fevers, malaise, unintentional weight loss, skin rash, eye pain, sinus congestion and sinus pain, sore throat, dysphagia,  hemoptysis , cough, dyspnea, wheezing, chest pain, palpitations, orthopnea, edema, abdominal pain, nausea, melena,  diarrhea, constipation, flank pain, dysuria, hematuria, urinary  Frequency, nocturia, numbness, tingling, seizures,  Focal weakness, Loss of consciousness,  Tremor, insomnia, depression, anxiety, and suicidal ideation.     History   Social History  . Marital Status: Married    Spouse Name: N/A    Number of Children: 2  . Years of Education: N/A   Occupational History  .     Social History Main Topics  . Smoking status: Former Smoker -- 0.50 packs/day for 40 years    Types: Cigarettes    Quit date: 09/20/1983  . Smokeless tobacco: Never Used  . Alcohol Use: Yes     Comment: yes, social wine  . Drug Use: No  . Sexual Activity: Not on file   Other Topics Concern  . Not on file   Social History Narrative  . No narrative on  file    Objective:  Filed Vitals:   07/09/14 1008  BP: 124/68  Pulse: 81  Temp: 98.4 F (36.9 C)  Resp: 16     General appearance: alert, cooperative and appears stated age Ears: normal TM's and external ear canals both ears Throat: lips, mucosa, and tongue normal; teeth and gums normal Neck: no adenopathy, no carotid bruit, supple, symmetrical, trachea midline and thyroid not enlarged, symmetric, no tenderness/mass/nodules Back: symmetric, no curvature. ROM normal. No CVA tenderness. Lungs: clear to auscultation bilaterally Heart: regular rate and rhythm, S1, S2 normal, no murmur, click, rub or gallop Abdomen: soft, non-tender; bowel sounds normal; no masses,  no organomegaly Pulses: 2+ and symmetric Skin: Skin color, texture, turgor normal. No rashes or lesions Lymph nodes: Cervical, supraclavicular, and axillary nodes normal.  Assessment and Plan:  COPD exacerbation CBC and CXr are nonconcerning for bacterial infection. Will treat with steorids and cough suppressants and withhold use of abx given prior hospitalizations for recurrent c dificile colitis despite use of probiotics.     Updated Medication List Outpatient Encounter Prescriptions as of 07/09/2014  Medication Sig  . ALPRAZolam (XANAX) 0.25 MG tablet Take 1 tablet (0.25 mg total) by mouth at bedtime as needed for anxiety.  Marland Kitchen amLODipine (NORVASC) 2.5 MG tablet TAKE 1 TABLET EVERY DAY  . aspirin 81 MG tablet Take 81 mg by mouth daily.    . calcium-vitamin D (SM CALCIUM 500/VITAMIN D3) 500-400 MG-UNIT per tablet Take 1 tablet by mouth daily.    . clopidogrel (PLAVIX) 75 MG tablet TAKE 1 TABLET EVERY DAY  . esomeprazole (NEXIUM) 40 MG capsule TAKE 1 CAPSULE DAILY BEFORE BREAKFAST  . hyoscyamine (LEVSIN SL) 0.125 MG SL tablet Place 1 tablet (0.125 mg total) under the tongue every 4 (four) hours as needed.  . isosorbide mononitrate (IMDUR) 30 MG 24 hr tablet Take 30 mg by mouth daily.  Marland Kitchen losartan (COZAAR) 100 MG  tablet TAKE 1 TABLET EVERY DAY  . multivitamin (THERAGRAN) per tablet Take 1 tablet by mouth daily.    . nitroGLYCERIN (NITROSTAT) 0.4 MG SL tablet Place 1 tablet (0.4 mg total) under the tongue every 5 (five) minutes as needed for chest pain.  Marland Kitchen Psyllium-Calcium (METAMUCIL PLUS CALCIUM) CAPS Take 2 capsules by mouth daily as needed.   . simvastatin (ZOCOR) 40 MG tablet Take 1 tablet (40 mg total) by mouth at bedtime.  Marland Kitchen tiotropium (SPIRIVA HANDIHALER) 18 MCG inhalation capsule Place 1 capsule (18 mcg total) into inhaler and inhale daily. NEEDS APPT WITH DR MCQUAID  . benzonatate (TESSALON) 200 MG capsule Take 1 capsule (200 mg total) by mouth 3 (three)  times daily as needed for cough.  . predniSONE (STERAPRED UNI-PAK) 10 MG tablet 6 tablets on Day 1 , then reduce by 1 tablet daily until gone     Orders Placed This Encounter  Procedures  . DG Chest 2 View  . CBC with Differential    No Follow-up on file.

## 2014-07-09 NOTE — Progress Notes (Signed)
Pre-visit discussion using our clinic review tool. No additional management support is needed unless otherwise documented below in the visit note.  

## 2014-07-09 NOTE — Patient Instructions (Addendum)
I do not think you have a pneumonia that requires antibiotics. I think you had a viral infection.  I suggest we repeat your chest x ray next week at [facility] using the order i have given you.  The following medications will help resolve the cough from irritate airways   Benadryl (dipenhydramine) : 25 mg one hour before bedtime. This is  to dry up post nasal drip that tickles your throat  Prednisone taper starting today  Benzonatate capsules every 8 hours for cough.  If you are still coughing through the night ,  Let me know by Friday afternoon so I can have a prescription for Tussionex cough syrup waiting for you

## 2014-07-10 ENCOUNTER — Encounter: Payer: Self-pay | Admitting: Internal Medicine

## 2014-07-11 ENCOUNTER — Other Ambulatory Visit: Payer: Self-pay | Admitting: Internal Medicine

## 2014-07-11 MED ORDER — HYDROCOD POLST-CHLORPHEN POLST 10-8 MG/5ML PO LQCR: 5.0000 mL | Freq: Two times a day (BID) | ORAL | Status: DC | PRN

## 2014-07-12 DIAGNOSIS — J441 Chronic obstructive pulmonary disease with (acute) exacerbation: Secondary | ICD-10-CM | POA: Insufficient documentation

## 2014-07-12 NOTE — Assessment & Plan Note (Signed)
CBC and CXr are nonconcerning for bacterial infection. Will treat with steorids and cough suppressants and withhold use of abx given prior hospitalizations for recurrent c dificile colitis despite use of probiotics.

## 2014-07-17 DIAGNOSIS — I700 Atherosclerosis of aorta: Secondary | ICD-10-CM

## 2014-07-17 DIAGNOSIS — I2510 Atherosclerotic heart disease of native coronary artery without angina pectoris: Secondary | ICD-10-CM

## 2014-07-17 DIAGNOSIS — K2950 Unspecified chronic gastritis without bleeding: Secondary | ICD-10-CM | POA: Diagnosis not present

## 2014-07-21 ENCOUNTER — Encounter: Payer: Self-pay | Admitting: Internal Medicine

## 2014-08-01 ENCOUNTER — Other Ambulatory Visit: Payer: Self-pay | Admitting: Internal Medicine

## 2014-08-01 DIAGNOSIS — R55 Syncope and collapse: Secondary | ICD-10-CM | POA: Insufficient documentation

## 2014-08-01 DIAGNOSIS — R001 Bradycardia, unspecified: Secondary | ICD-10-CM | POA: Insufficient documentation

## 2014-08-06 ENCOUNTER — Other Ambulatory Visit: Payer: Self-pay | Admitting: Cardiology

## 2014-08-11 ENCOUNTER — Encounter: Payer: Self-pay | Admitting: Internal Medicine

## 2014-09-09 DIAGNOSIS — D485 Neoplasm of uncertain behavior of skin: Secondary | ICD-10-CM | POA: Diagnosis not present

## 2014-09-09 DIAGNOSIS — L568 Other specified acute skin changes due to ultraviolet radiation: Secondary | ICD-10-CM | POA: Diagnosis not present

## 2014-09-09 DIAGNOSIS — X32XXXA Exposure to sunlight, initial encounter: Secondary | ICD-10-CM | POA: Diagnosis not present

## 2014-09-09 DIAGNOSIS — L821 Other seborrheic keratosis: Secondary | ICD-10-CM | POA: Diagnosis not present

## 2014-09-09 DIAGNOSIS — C44729 Squamous cell carcinoma of skin of left lower limb, including hip: Secondary | ICD-10-CM | POA: Diagnosis not present

## 2014-09-09 DIAGNOSIS — Z85828 Personal history of other malignant neoplasm of skin: Secondary | ICD-10-CM | POA: Diagnosis not present

## 2014-09-09 DIAGNOSIS — D0472 Carcinoma in situ of skin of left lower limb, including hip: Secondary | ICD-10-CM | POA: Diagnosis not present

## 2014-09-09 DIAGNOSIS — L570 Actinic keratosis: Secondary | ICD-10-CM | POA: Diagnosis not present

## 2014-10-07 ENCOUNTER — Encounter: Payer: Self-pay | Admitting: Cardiovascular Disease

## 2014-10-07 ENCOUNTER — Ambulatory Visit (INDEPENDENT_AMBULATORY_CARE_PROVIDER_SITE_OTHER): Payer: Medicare Other | Admitting: Cardiovascular Disease

## 2014-10-07 VITALS — BP 110/60 | HR 74 | Ht 63.5 in | Wt 135.2 lb

## 2014-10-07 DIAGNOSIS — E785 Hyperlipidemia, unspecified: Secondary | ICD-10-CM | POA: Diagnosis not present

## 2014-10-07 DIAGNOSIS — I10 Essential (primary) hypertension: Secondary | ICD-10-CM | POA: Diagnosis not present

## 2014-10-07 DIAGNOSIS — R0602 Shortness of breath: Secondary | ICD-10-CM

## 2014-10-07 DIAGNOSIS — I779 Disorder of arteries and arterioles, unspecified: Secondary | ICD-10-CM | POA: Diagnosis not present

## 2014-10-07 DIAGNOSIS — I739 Peripheral vascular disease, unspecified: Secondary | ICD-10-CM

## 2014-10-07 DIAGNOSIS — I2510 Atherosclerotic heart disease of native coronary artery without angina pectoris: Secondary | ICD-10-CM | POA: Diagnosis not present

## 2014-10-07 MED ORDER — TRAZODONE HCL 50 MG PO TABS: 50.0000 mg | ORAL_TABLET | Freq: Every evening | ORAL | Status: DC | PRN

## 2014-10-07 NOTE — Assessment & Plan Note (Signed)
Currently with no symptoms of angina. No further workup at this time. Continue current medication regimen. 

## 2014-10-07 NOTE — Patient Instructions (Signed)
You are doing well. No medication changes were made.  Please call us if you have new issues that need to be addressed before your next appt.  Your physician wants you to follow-up in: 6 months.  You will receive a reminder letter in the mail two months in advance. If you don't receive a letter, please call our office to schedule the follow-up appointment.   

## 2014-10-07 NOTE — Assessment & Plan Note (Signed)
Stable carotid arterial disease. Continue aggressive cholesterol management

## 2014-10-07 NOTE — Assessment & Plan Note (Signed)
Cholesterol is at goal on the current lipid regimen. No changes to the medications were made.  

## 2014-10-07 NOTE — Assessment & Plan Note (Addendum)
Blood pressure is running low on today's visit. This was discussed with her. She does not check at home. I commended that she periodically monitor her blood pressure and call our office for any lightheadedness. If systolic pressure continues to run low, would cut the losartan down to 50 mg daily

## 2014-10-07 NOTE — Progress Notes (Signed)
[name] ID: [name], female    DOB: 01-23-1926, 81 y.o.   MRN: [id number]  HPI Comments: [name] is a pleasant 81 year old woman with a history of CAD, bypass surgery in 1999, long history of smoking for 40 years who quit in 1985, COPD, chronic renal insufficiency, hypertension, hyperlipidemia, 40-50% bilateral carotid arterial disease who presents for routine followup of her coronary artery disease  She has not had any intervention since her bypass surgery. No cardiac catheterizations, no stent placement.  C. difficile in January 2015. She was in the hospital for several days.  On today's visit, she reports that she is doing very well. She is using the treadmill, playing golf on a regular basis. In general active with no symptoms concerning for angina She denies any lightheadedness or dizziness. She does not measure her blood pressure at home. Systolic pressure low on today's visit, she is unaware  EKG on today's visit shows normal sinus rhythm with rate 70 bpm, nonspecific T wave abnormality through the anterior precordial leads, unchanged from prior EKG  Last stress test was October 2011. Carotid ultrasound showing 40-59% disease  Prior lab work showing total cholesterol 140, LDL 62   Allergies  Allergen Reactions  . Codeine Nausea And Vomiting    Outpatient Encounter Prescriptions as of 10/07/2014  Medication Sig  . ALPRAZolam (XANAX) 0.25 MG tablet Take 1 tablet (0.25 mg total) by mouth at bedtime as needed for anxiety.  Marland Kitchen amLODipine (NORVASC) 2.5 MG tablet TAKE 1 TABLET EVERY DAY  . aspirin 81 MG tablet Take 81 mg by mouth daily.    . calcium-vitamin D (SM CALCIUM 500/VITAMIN D3) 500-400 MG-UNIT per tablet Take 1 tablet by mouth daily.    . clopidogrel (PLAVIX) 75 MG tablet TAKE 1 TABLET EVERY DAY  . esomeprazole (NEXIUM) 40 MG capsule TAKE 1 CAPSULE DAILY BEFORE BREAKFAST  . hyoscyamine (LEVSIN SL) 0.125 MG SL tablet Place 1 tablet (0.125 mg total) under the  tongue every 4 (four) hours as needed.  . isosorbide mononitrate (IMDUR) 30 MG 24 hr tablet Take 30 mg by mouth daily.  Marland Kitchen losartan (COZAAR) 100 MG tablet TAKE 1 TABLET EVERY DAY  . multivitamin (THERAGRAN) per tablet Take 1 tablet by mouth daily.    . nitroGLYCERIN (NITROSTAT) 0.4 MG SL tablet Place 1 tablet (0.4 mg total) under the tongue every 5 (five) minutes as needed for chest pain.  Marland Kitchen Psyllium-Calcium (METAMUCIL PLUS CALCIUM) CAPS Take 2 capsules by mouth daily as needed.   . simvastatin (ZOCOR) 40 MG tablet Take 1 tablet (40 mg total) by mouth at bedtime.  Marland Kitchen tiotropium (SPIRIVA HANDIHALER) 18 MCG inhalation capsule Place 1 capsule (18 mcg total) into inhaler and inhale daily. NEEDS APPT WITH DR MCQUAID  . traZODone (DESYREL) 50 MG tablet Take 1 tablet (50 mg total) by mouth at bedtime as needed for sleep.  . [DISCONTINUED] benzonatate (TESSALON) 200 MG capsule Take 1 capsule (200 mg total) by mouth 3 (three) times daily as needed for cough. ([name] not taking: Reported on 10/07/2014)  . [DISCONTINUED] chlorpheniramine-HYDROcodone (TUSSIONEX) 10-8 MG/5ML LQCR Take 5 mLs by mouth every 12 (twelve) hours as needed for cough. ([name] not taking: Reported on 10/07/2014)  . [DISCONTINUED] predniSONE (STERAPRED UNI-PAK) 10 MG tablet 6 tablets on Day 1 , then reduce by 1 tablet daily until gone ([name] not taking: Reported on 10/07/2014)    Past Medical History  Diagnosis Date  . Carotid artery disease     Doppler January, 2012, 40-59%  bilateral, followup one year  . Hyperlipidemia   . Hypertension   . SOB (shortness of breath)   . Dizziness     stable now (Oct 2011) [name] tells me question of TIA, we will obtain records  . Syncopal episodes     after 18 holes of golf and increase hydrochlorothiazide, resolved   . Indigestion     3 weeks, October 2011  . CAD (coronary artery disease)     a. Nuclear 10/11, not gated, no scar or ischemia, EF 68%, low risk study; b. [place]  06/18/14: no evidence of infarction or ischemia, no WMA, normalization of TWI in precordial leads, equivocal EKG changes EF 77%, low risk study  . Rheumatic fever   . History of migraines   . Kidney stones   . GERD (gastroesophageal reflux disease)   . Diverticulitis     last colonoscopy  incomplete Jan 2011  . Hx of CABG     a. 3 vessel in 1999  . Gait abnormality     [name] complains of "wobbly gait", December, 2013  . Sinus bradycardia     Mild, December, 2013  . Diffuse cystic mastopathy   . C. difficile colitis     Past Surgical History  Procedure Laterality Date  . Breast biopsy  1970  . Esophagus stretched    . Appendectomy  1950  . Coronary artery bypass graft  1999  . Tonsillectomy  1939  . Total abdominal hysterectomy  1968    due to metrorrhagia  . Cataract extraction Right 2009  . Cataract extraction Left 2011  . Colonoscopy  [id number]    Dr. Jamal Collin    Social History  reports that she quit smoking about 31 years ago. Her smoking use included Cigarettes. She has a 20 pack-year smoking history. She has never used smokeless tobacco. She reports that she drinks alcohol. She reports that she does not use illicit drugs.  Family History family history includes Heart disease in her father and mother. There is no history of Cancer or Drug abuse.     Review of Systems  Constitutional: Negative.   Respiratory: Negative.   Cardiovascular: Negative.   Gastrointestinal: Negative.   Musculoskeletal: Negative.   Skin: Negative.   Allergic/Immunologic: Negative.   Neurological: Negative.   Hematological: Negative.   Psychiatric/Behavioral: Negative.   All other systems reviewed and are negative.   BP 110/60 mmHg  Pulse 74  Ht 5' 3.5" (1.613 m)  Wt 135 lb 4 oz (61.349 kg)  BMI 23.58 kg/m2  Physical Exam  Constitutional: She is oriented to person, place, and time. She appears well-developed and well-nourished.  HENT:  Head: Normocephalic.  Nose: Nose normal.   Mouth/Throat: Oropharynx is clear and moist.  Eyes: Conjunctivae are normal. Pupils are equal, round, and reactive to light.  Neck: Normal range of motion. Neck supple. No JVD present.  Cardiovascular: Normal rate, regular rhythm, S1 normal, S2 normal, normal heart sounds and intact distal pulses.  Exam reveals no gallop and no friction rub.   No murmur heard. Pulmonary/Chest: Effort normal and breath sounds normal. No respiratory distress. She has no wheezes. She has no rales. She exhibits no tenderness.  Abdominal: Soft. Bowel sounds are normal. She exhibits no distension. There is no tenderness.  Musculoskeletal: Normal range of motion. She exhibits no edema or tenderness.  Lymphadenopathy:    She has no cervical adenopathy.  Neurological: She is alert and oriented to person, place, and time. Coordination normal.  Skin: Skin  is warm and dry. No rash noted. No erythema.  Psychiatric: She has a normal mood and affect. Her behavior is normal. Judgment and thought content normal.    Assessment and Plan  Nursing note and vitals reviewed.

## 2014-10-08 DIAGNOSIS — C44729 Squamous cell carcinoma of skin of left lower limb, including hip: Secondary | ICD-10-CM | POA: Diagnosis not present

## 2014-10-08 DIAGNOSIS — L905 Scar conditions and fibrosis of skin: Secondary | ICD-10-CM | POA: Diagnosis not present

## 2014-10-13 ENCOUNTER — Ambulatory Visit: Payer: Medicare Other | Admitting: Cardiovascular Disease

## 2014-10-14 ENCOUNTER — Other Ambulatory Visit: Payer: Self-pay | Admitting: Cardiovascular Disease

## 2014-10-24 DIAGNOSIS — D0472 Carcinoma in situ of skin of left lower limb, including hip: Secondary | ICD-10-CM | POA: Diagnosis not present

## 2014-11-10 ENCOUNTER — Ambulatory Visit: Payer: Self-pay | Admitting: General Surgery

## 2014-11-10 DIAGNOSIS — Z1231 Encounter for screening mammogram for malignant neoplasm of breast: Secondary | ICD-10-CM | POA: Diagnosis not present

## 2014-11-21 ENCOUNTER — Telehealth: Payer: Self-pay | Admitting: *Deleted

## 2014-11-21 NOTE — Telephone Encounter (Signed)
Lmom to call our office to schedule a carotid doppler (

## 2014-11-24 ENCOUNTER — Other Ambulatory Visit: Payer: Self-pay | Admitting: Internal Medicine

## 2014-11-24 ENCOUNTER — Other Ambulatory Visit: Payer: Self-pay | Admitting: Radiology

## 2014-11-24 ENCOUNTER — Ambulatory Visit (INDEPENDENT_AMBULATORY_CARE_PROVIDER_SITE_OTHER): Payer: Medicare Other | Admitting: General Surgery

## 2014-11-24 ENCOUNTER — Encounter: Payer: Self-pay | Admitting: General Surgery

## 2014-11-24 VITALS — BP 122/62 | HR 64 | Resp 14 | Ht 64.0 in | Wt 133.0 lb

## 2014-11-24 DIAGNOSIS — I6523 Occlusion and stenosis of bilateral carotid arteries: Secondary | ICD-10-CM

## 2014-11-24 DIAGNOSIS — N6019 Diffuse cystic mastopathy of unspecified breast: Secondary | ICD-10-CM | POA: Diagnosis not present

## 2014-11-24 NOTE — Patient Instructions (Signed)
The patient has been asked to return to the office in one year with a bilateral screening mammogram. 

## 2014-11-24 NOTE — Progress Notes (Signed)
Patient ID: [name], female   DOB: 23-Jul-1926, 81 y.o.   MRN: [id number]  Chief Complaint  Patient presents with  . Follow-up    mammogram    HPI [name] is a 81 y.o. female who presents for a breast evaluation. The most recent mammogram was done on 11/10/14.  Patient does perform regular self breast checks and gets regular mammograms done.    HPI  Past Medical History  Diagnosis Date  . Carotid artery disease     Doppler January, 2012, 40-59% bilateral, followup one year  . Hyperlipidemia   . Hypertension   . SOB (shortness of breath)   . Dizziness     stable now (Oct 2011) patient tells me question of TIA, we will obtain records  . Syncopal episodes     after 18 holes of golf and increase hydrochlorothiazide, resolved   . Indigestion     3 weeks, October 2011  . CAD (coronary artery disease)     a. Nuclear 10/11, not gated, no scar or ischemia, EF 68%, low risk study; b. [place] 06/18/14: no evidence of infarction or ischemia, no WMA, normalization of TWI in precordial leads, equivocal EKG changes EF 77%, low risk study  . Rheumatic fever   . History of migraines   . Kidney stones   . GERD (gastroesophageal reflux disease)   . Diverticulitis     last colonoscopy  incomplete Jan 2011  . Hx of CABG     a. 3 vessel in 1999  . Gait abnormality     Patient complains of "wobbly gait", December, 2013  . Sinus bradycardia     Mild, December, 2013  . Diffuse cystic mastopathy   . C. difficile colitis     Past Surgical History  Procedure Laterality Date  . Breast biopsy  1970  . Esophagus stretched    . Appendectomy  1950  . Coronary artery bypass graft  1999  . Tonsillectomy  1939  . Total abdominal hysterectomy  1968    due to metrorrhagia  . Cataract extraction Right 2009  . Cataract extraction Left 2011  . Colonoscopy  [id number]    Dr. Jamal Collin    Family History  Problem Relation Age of Onset  . Heart disease Mother   . Heart disease  Father   . Cancer Neg Hx   . Drug abuse Neg Hx     Social History History  Substance Use Topics  . Smoking status: Former Smoker -- 0.50 packs/day for 40 years    Types: Cigarettes    Quit date: 09/20/1983  . Smokeless tobacco: Never Used  . Alcohol Use: Yes     Comment: yes, social wine    Allergies  Allergen Reactions  . Codeine Nausea And Vomiting    Current Outpatient Prescriptions  Medication Sig Dispense Refill  . ALPRAZolam (XANAX) 0.25 MG tablet Take 1 tablet (0.25 mg total) by mouth at bedtime as needed for anxiety. 30 tablet 3  . amLODipine (NORVASC) 2.5 MG tablet TAKE 1 TABLET EVERY DAY 30 tablet 5  . aspirin 81 MG tablet Take 81 mg by mouth daily.      . calcium-vitamin D (SM CALCIUM 500/VITAMIN D3) 500-400 MG-UNIT per tablet Take 1 tablet by mouth daily.      . clopidogrel (PLAVIX) 75 MG tablet TAKE 1 TABLET EVERY DAY 90 tablet 1  . esomeprazole (NEXIUM) 40 MG capsule TAKE 1 CAPSULE DAILY BEFORE BREAKFAST 90 capsule 1  . hyoscyamine (LEVSIN  SL) 0.125 MG SL tablet Place 1 tablet (0.125 mg total) under the tongue every 4 (four) hours as needed. 30 tablet 0  . isosorbide mononitrate (IMDUR) 30 MG 24 hr tablet Take 30 mg by mouth daily.    Marland Kitchen losartan (COZAAR) 100 MG tablet TAKE 1 TABLET EVERY DAY 30 tablet 5  . multivitamin (THERAGRAN) per tablet Take 1 tablet by mouth daily.      . nitroGLYCERIN (NITROSTAT) 0.4 MG SL tablet Place 1 tablet (0.4 mg total) under the tongue every 5 (five) minutes as needed for chest pain. 25 tablet 6  . Psyllium-Calcium (METAMUCIL PLUS CALCIUM) CAPS Take 2 capsules by mouth daily as needed.     . simvastatin (ZOCOR) 40 MG tablet TAKE ONE TABLET AT BEDTIME 90 tablet 3  . tiotropium (SPIRIVA HANDIHALER) 18 MCG inhalation capsule Place 1 capsule (18 mcg total) into inhaler and inhale daily. NEEDS APPT WITH DR MCQUAID 30 capsule 2  . traZODone (DESYREL) 50 MG tablet Take 1 tablet (50 mg total) by mouth at bedtime as needed for sleep. 30 tablet  1   No current facility-administered medications for this visit.    Review of Systems Review of Systems  Constitutional: Negative.   Respiratory: Negative.   Cardiovascular: Negative.     Blood pressure 122/62, pulse 64, resp. rate 14, height 5\' 4"  (1.626 m), weight 133 lb (60.328 kg).  Physical Exam Physical Exam  Constitutional: She is oriented to person, place, and time. She appears well-developed and well-nourished.  Eyes: Conjunctivae are normal. No scleral icterus.  Neck: Neck supple.  Cardiovascular: Normal rate, regular rhythm and normal heart sounds.   Pulmonary/Chest: Effort normal and breath sounds normal. Right breast exhibits no inverted nipple, no mass, no nipple discharge, no skin change and no tenderness. Left breast exhibits no inverted nipple, no mass, no nipple discharge, no skin change and no tenderness.  Abdominal: Soft. Bowel sounds are normal. There is no tenderness.  Lymphadenopathy:    She has no cervical adenopathy.    She has axillary adenopathy.  Neurological: She is alert and oriented to person, place, and time.  Skin: Skin is warm and dry.    Data Reviewed Mammogram reviewed  Assessment    Stable exam. History of FCD. FH of breast cancer     Plan    The patient has been asked to return to the office in one year with a bilateral screening mammogram.       SANKAR,SEEPLAPUTHUR G 11/24/2014, 2:19 PM

## 2014-12-08 ENCOUNTER — Other Ambulatory Visit: Payer: Self-pay | Admitting: Internal Medicine

## 2014-12-15 ENCOUNTER — Encounter (INDEPENDENT_AMBULATORY_CARE_PROVIDER_SITE_OTHER): Payer: Medicare Other

## 2014-12-15 DIAGNOSIS — I6523 Occlusion and stenosis of bilateral carotid arteries: Secondary | ICD-10-CM

## 2015-01-02 ENCOUNTER — Encounter: Payer: Self-pay | Admitting: General Surgery

## 2015-01-10 NOTE — Consult Note (Signed)
General Aspect PCP: Dr. Derrel Nip, MD Primary Cardiologist: Dr. Rockey Situ, MD _________________________  81 year old female with history of CAD s/p CABG x 3 in 1999 (no intervention since), long history of tobacco abuse for 40 years (quit 1985), COPD, chronic renal insufficiency, HTN, HLD, & 40-50% bilateral carotid arterial disease who presents to [facility] today with chest pain.  ________________________  PMH:  1. CAD s/p CABG x 3 1999 2. COPD (long history of tobacco abuse x 40 years, quit 1985) 3. Chronic renal insufficiency 4. HTN 5. HLD 6. Bilateral carotid artery disease (40-50%) _________________________   Present Illness 81 year old female with the above problem list who presents to [facility] today with chest pain.   Patient with history of CAD s/p CABG 1999 without cardiac intervention - including catheterizations/stenting since. She last underwent cardiac diagnostic evaluation in October 2011: no evidence of ischemia by perfusion, no EKG changes of ischemia, EF 68%, low risk study. She last saw Dr. Rockey Situ, MD 03/14/2014 and reported angina decubitis at that time. She denied any symptoms during the day or with exertion. She described 5/10 discomfort at that time and denied any changes to her daily routine, including exertional. Her prior anginal symptoms related to CABG included SOB with exertion. After a long discussion with the patient it was decided to continue with watchful waiting. She called back a month later (7/24) with a "funny" feeling in her chest that radiated to her axilla with associated dizziness. She reports chronic C diff and dehydration as well. She took some meclizine at that time - symptoms resolved over the next couple of days. She followed up with PCP who suspected BPPV. She has had recent bouts of URI/sinusitis in early September.   Today she comes into [facility] with substernal chest pain that developed around 7 AM with associated SOB (anginal equivalent) and nausea. She also notes  left arm pain for the past 1 1/2 weeks. When she went to bed the night prior she just did not feel right. She tossed and turned all night long. Upon waking she developed 10/10 substernal chest pain with the above SOB and nausea. She proceeded to get out of bed and fix herself breakfast and attempt to eat it while wondering if she should call EMS or not. Finally, she decided to call EMS. Before calling she took SL NTG x 1 without much relief of her chest pain. The 911 call center advised her to chew up 4 baby aspirin, which she did. This helped her chest pain slightly. Upon her arrival to [facility] her EKG was NSR, 76, slight st depression, II, aVF. Initial TnI negative at <0.02. Second TnI also negative at <0.02. Third, TnI pending at time of consult. CXR: lungs are hyperinflated 2/2 COPD, no active disease. SCr 1.42, ProBNP 653, K+ 4.3, HGB 13.5. Her chest pain has continued since her admission, but is considerably improved.   She denies any diaphoresis, vomiting, edema, palpitations, presyncope, syncope, PND, or orthopnea.   Physical Exam:  GEN well developed, well nourished, no acute distress   HEENT hearing intact to voice, moist oral mucosa   RESP normal resp effort  clear BS   CARD Regular rate and rhythm  No murmur   ABD denies tenderness  soft  normal BS   EXTR negative edema   SKIN normal to palpation   NEURO cranial nerves intact   PSYCH alert, A+O to time, place, person, good insight   Review of Systems:  Subjective/Chief Complaint Chest pain  General: Fatigue   Skin: No Complaints    ENT: No Complaints   Eyes: No Complaints   Neck: No Complaints   Respiratory: Short of breath   Cardiovascular: Chest pain or discomfort  Tightness   Gastrointestinal: No Complaints   Genitourinary: No Complaints   Vascular: No Complaints   Musculoskeletal: No Complaints   Neurologic: No Complaints   Hematologic: No Complaints   Endocrine: No Complaints   Psychiatric: No  Complaints   Review of Systems: All other systems were reviewed and found to be negative   Medications/Allergies Reviewed Medications/Allergies reviewed   Family & Social History:  Family and Social History:  Family History Coronary Artery Disease  Mother: CAD; Father: CAD   Social History negative ETOH, negative Illicit drugs   + Tobacco Prior (greater than 1 year)  40 pack years, quit [place]  lives in [place]     CAD:    Hypertension:    Angina:    COPD:    CABG (Coronary Artery Bypass Graft):          Admit Diagnosis:   CHEST PAIN: Onset Date: 17-Jun-2014, Status: Active, Description: CHEST PAIN  Home Medications: Medication Instructions Status  losartan 100 mg oral tablet 1 tab(s) orally once a day Active  amLODIPine 2.5 mg oral tablet 1 tab(s) orally once a day Active  Spiriva 18 mcg inhalation capsule 1 each inhaled once a day Active  simvastatin 40 mg oral tablet 1 tab(s) orally once a day (at bedtime) Active  Plavix 75 mg oral tablet 1 tab(s) orally once a day Active  esomeprazole 40 mg oral delayed release capsule 1 cap(s) orally once a day Active   Lab Results:  Hepatic:  29-Sep-15 09:50   Bilirubin, Total 0.7  Alkaline Phosphatase 71 (46-116 NOTE: New Reference Range 04/08/14)  SGPT (ALT) 32 (14-63 NOTE: New Reference Range 04/08/14)  SGOT (AST) 23  Total Protein, Serum  5.6  Albumin, Serum  2.9  Routine Chem:  29-Sep-15 09:50   Glucose, Serum  115  BUN  22  Creatinine (comp)  1.42  Sodium, Serum 142  Potassium, Serum 4.3  Chloride, Serum 107  CO2, Serum 28  Calcium (Total), Serum  8.2  Osmolality (calc) 287  eGFR (African American)  45  eGFR (Non-African American)  37 (eGFR values <62m/min/1.73 m2 may be an indication of chronic kidney disease (CKD). Calculated eGFR, using the MRDR Study equation, is useful in  patients with stable renal function. The eGFR calculation will not be reliable in acutely ill  patients when serum creatinine is changing rapidly. It is not useful in patients on dialysis. The eGFR calculation may not be applicable to patients at the low and high extremes of body sizes, pregnant women, and vetetarians.)  Anion Gap 7  B-Type Natriuretic Peptide ([facility])  653 (Result(s) reported on 17 Jun 2014 at 10:30AM.)  Cardiac:  29-Sep-15 09:50   CK, Total  23 (26-192 NOTE: NEW REFERENCE RANGE  10/21/2013)  CPK-MB, Serum  < 0.5 (Result(s) reported on 17 Jun 2014 at 10:30AM.)  Troponin I < 0.02 (0.00-0.05 0.05 ng/mL or less: NEGATIVE  Repeat testing in 3-6 hrs  if clinically indicated. >0.05 ng/mL: POTENTIAL  MYOCARDIAL INJURY. Repeat  testing in 3-6 hrs if  clinically indicated. NOTE: An increase or decrease  of 30% or more on serial  testing suggests a  clinically important change)    13:44   Troponin I < 0.02 (0.00-0.05 0.05 ng/mL or less:  NEGATIVE  Repeat testing in 3-6 hrs  if clinically indicated. >0.05 ng/mL: POTENTIAL  MYOCARDIAL INJURY. Repeat  testing in 3-6 hrs if  clinically indicated. NOTE: An increase or decrease  of 30% or more on serial  testing suggests a  clinically important change)  Routine Coag:  29-Sep-15 09:50   Prothrombin 12.7  INR 1.0 (INR reference interval applies to patients on anticoagulant therapy. A single INR therapeutic range for coumarins is not optimal for all indications; however, the suggested range for most indications is 2.0 - 3.0. Exceptions to the INR Reference Range may include: Prosthetic heart valves, acute myocardial infarction, prevention of myocardial infarction, and combinations of aspirin and anticoagulant. The need for a higher or lower target INR must be assessed individually. Reference: The Pharmacology and Management of the Vitamin K  antagonists: the seventh ACCP Conference on Antithrombotic and Thrombolytic Therapy. KPTWS.5681 Sept:126 (3suppl): N9146842. A HCT value >55% may artifactually increase the  PT.  In one study,  the increase was an average of 25%. Reference:  "Effect on Routine and Special Coagulation Testing Values of Citrate Anticoagulant Adjustment in Patients with High HCT Values." American Journal of Clinical Pathology 2006;126:400-405.)  Activated PTT (APTT)  23.3 (A HCT value >55% may artifactually increase the APTT. In one study, the increase was an average of 19%. Reference: "Effect on Routine and Special Coagulation Testing Values of Citrate Anticoagulant Adjustment in Patients with High HCT Values." American Journal of Clinical Pathology 2006;126:400-405.)  Routine Hem:  29-Sep-15 09:50   WBC (CBC) 6.0  RBC (CBC) 4.16  Hemoglobin (CBC) 13.5  Hematocrit (CBC) 40.6  Platelet Count (CBC)  139 (Result(s) reported on 17 Jun 2014 at 10:23AM.)  MCV 98  MCH 32.5  MCHC 33.3  RDW 13.3   EKG:  EKG Interp. by me   Interpretation EKG shows NSR, 76, slight st depression II, aVF   Radiology Results: XRay:    29-Sep-15 10:02, Chest Portable Single View  Chest Portable Single View   REASON FOR EXAM:    Chest Pain  COMMENTS:       PROCEDURE: DXR - DXR PORTABLE CHEST SINGLE VIEW  - Jun 17 2014 10:02AM     CLINICAL DATA:  Chest pain this morning, midsternal, shortness of  breath with exertion, hx of CABG, COPD    EXAM:  PORTABLE CHEST - 1 VIEW    COMPARISON:  None.    FINDINGS:  The lungs are hyperinflated likely secondary to COPD. There is no  focal parenchymal opacity, pleural effusion, or pneumothorax. The  heart and mediastinal contours are unremarkable. There is evidence  of prior CABG.    The osseous structures are unremarkable.     IMPRESSION:  No active disease.      Electronically Signed    By: Kathreen Devoid    On: 06/17/2014 10:14       Verified By: Jennette Banker, M.D., MD    Codeine: N/V  Vital Signs/Nurse's Notes: **Vital Signs.:   29-Sep-15 13:27  Vital Signs Type Admission  Temperature Temperature (F) 98  Celsius 36.6   Temperature Source oral  Pulse Pulse 62  Respirations Respirations 20  Systolic BP Systolic BP 275  Diastolic BP (mmHg) Diastolic BP (mmHg) 58  Mean BP 86  Pulse Ox % Pulse Ox % 96  Pulse Ox Activity Level  At rest  Oxygen Delivery Room Air/ 21 %    Impression 81 year old female with history of CAD s/p CABG x 3 in 1999 (no  intervention since), long history of tobacco abuse for 40 years (quit 1985), COPD, chronic renal insufficiency, HTN, HLD, & 40-50% bilateral carotid arterial disease who presents to [facility] today with chest pain.  1. Unstable angina: -Still with chest pain, though improved  -Symptoms concerning for unstable angina -TnI negative x 2 - await final TnI -Lexiscan Myoview 06/18/14 - -Check echo to evaluate LV function -Add SL NTG 0.4 mg to get her chest pain free, once chest pain free --> nitro paste  -Hold heparin gtt pending negative TnI -Has already received aspirin 324 mg at home  2. CAD s/p CABG x 3 in 1999: -No intervention since 1999 -Last cardiac diagnostic evaluation October 2011 - not gated study, no evidence of ischemia, no EKG changes of ischemia, EF 68%, low risk study -On aspirin 81 mg, Plavix 75 mg, Lopressor 25 mg bid, losartan 100 mg, simvastatin 40 mg  3. HLD: -Check FLP -Currently on simvastatin, may need high dose Lipitor  4. HTN: -Continue Lopressor 25 mg bid and losartan 100 mg   Electronic Signatures: Christell Faith M (PA-C)  (Signed 29-Sep-15 17:34)  Authored: General Aspect/Present Illness, History and Physical Exam, Review of System, Family & Social History, Past Medical History, Home Medications, Labs, EKG , Radiology, Allergies, Vital Signs/Nurse's Notes, Impression/Plan Ida Rogue (MD)  (Signed 29-Sep-15 19:23)  Authored: General Aspect/Present Illness, History and Physical Exam, Review of System, Family & Social History, Health Issues, EKG , Impression/Plan  Co-Signer: General Aspect/Present Illness, Home Medications,  Allergies   Last Updated: 29-Sep-15 19:23 by Ida Rogue (MD)

## 2015-01-10 NOTE — Discharge Summary (Signed)
PATIENT NAME:  [name], [name] MR#:  [id number] DATE OF BIRTH:  Nov 14, 1925  DATE OF ADMISSION:  06/17/2014 DATE OF DISCHARGE:  06/18/2014  DISCHARGE DIAGNOSIS:  Unstable angina with some intermittent chest pain still present. Doubt cardiac with negative cardiac enzymes and negative Myoview, normal echocardiogram.  Added Imdur.     SECONDARY DIAGNOSES: 1. Hypertension.  2. Chronic obstructive pulmonary disease.   CONSULTATION: Cardiology, Dr. Ida Rogue.   PROCEDURES AND RADIOLOGY:  1. Myoview on 30th of September showed no evidence of ischemia or infarction. No significant wall motion abnormality. Ejection fraction of 77%.  2. A 2-D echocardiogram on 30th of September showed left ventricular ejection fraction of 60% to 65%, impaired relaxation of left ventricular diastolic filling.  3. Chest x-ray on the 29th of September showed no acute cardiopulmonary disease.   HISTORY AND SHORT HOSPITAL COURSE: The patient is an 81 year old female with the above-mentioned medical problems, who was admitted for unstable angina. Please see Dr. Governor Specking dictated history and physical for further details. Cardiology consultation was obtained with Dr. Ida Rogue, who recommended Myoview along with 2-D echocardiogram, which was performed, all essentially within normal limits. The patient was also ruled out with 3 negative sets of troponins. The patient was having some intermittent chest pain, for which Imdur was added per cardiology recommendation. She was agreeable with the discharge plan and her pain was much under control and manageable. She was discharged home on the 30th of September in stable condition.   PHYSICAL EXAMINATION:  VITAL SIGNS: On the date of discharge, her vital signs are as follows: Temperature 98.5, heart rate 63 per minute, respirations 18 per minute, blood pressure 120/66. She was saturating 94% on room air. Pertinent physical examination on the date of discharge:   CARDIOVASCULAR: S1, S2 normal. No murmurs, rubs, or gallops.  LUNGS: Clear to auscultation bilaterally. No wheezing, rales, rhonchi, or crepitation.  ABDOMEN: Soft and benign.  NEUROLOGIC: Nonfocal examination. All other physical examination remained at baseline.   DISCHARGE MEDICATIONS: 1. Simvastatin 40 mg p.o. at bedtime.  2. Plavix 75 mg p.o. daily.  3. Losartan 100 mg p.o. daily.  4. Amlodipine 2.5 mg p.o. daily.  5. Spiriva once daily.  6. Nexium 40 mg p.o. b.i.d.  7. Imdur 30 mg p.o. daily.   DISCHARGE DIET: Low sodium, low fat, low cholesterol.   DISCHARGE ACTIVITY: As tolerated.   DISCHARGE INSTRUCTIONS AND FOLLOWUP: The patient was instructed to follow up with her primary care physician, Dr. Deborra Medina in 2 to 4 weeks. She will need follow-up with [place] cardiology doctor, Dr. Ida Rogue in 1 to 2 weeks.   TOTAL TIME DISCHARGING THIS PATIENT: 40 minutes.   ____________________________ Lucina Mellow. Manuella Ghazi, MD vss:hh D: 06/19/2014 23:33:02 ET T: 06/20/2014 00:46:03 ET JOB#: [id number]  cc: Nyanna Heideman S. Manuella Ghazi, MD, <Dictator> Deborra Medina, MD Minna Merritts, MD  Lucina Mellow [facility] MD ELECTRONICALLY SIGNED 06/24/2014 15:48

## 2015-01-10 NOTE — H&P (Signed)
PATIENT NAME:  [name], WAIGHT MR#:  [id number] DATE OF BIRTH:  08/09/1926  DATE OF ADMISSION:  10/12/2013  PRIMARY CARE PHYSICIAN: Deborra Medina, MD  REFERRING PHYSICIAN: Yetta Numbers. Karma Greaser, MD  CHIEF COMPLAINT: Epigastric abdominal pain, intractable nausea and vomiting.   HISTORY OF PRESENT ILLNESS: The patient is an 81 year old Caucasian female with a past medical history of epigastric abdominal pain, nausea and vomiting. The patient was in her usual state of health until 5:00 p.m. yesterday. At around 5:00 p.m. yesterday, the patient felt extremely nauseous and started vomiting. Whenever she vomits, she is having epigastric abdominal pain. The patient's lipase is elevated at 1200 in the ER. The patient's right upper quadrant ultrasound is negative for any abnormalities. The patient's lipase is elevated at 1200. Right upper quadrant ultrasound is negative for gallstones. During my examination, the patient is resting comfortably. No family members at bedside. Epigastric abdominal pain has eased up.   PAST MEDICAL HISTORY: Coronary artery disease status post CABG, hypertension, COPD, hyperlipidemia.   PAST SURGICAL HISTORY: Coronary artery bypass grafting.  ALLERGIES: THE PATIENT IS ALLERGIC TO CODEINE.   PSYCHOSOCIAL HISTORY: Lives at home, lives alone. No history of smoking. Drinks 2 glasses of wine every day.   FAMILY HISTORY: Both parents had coronary artery disease.   HOME MEDICATIONS: Xanax 0.25 mg p.o. b.i.d., Spiriva inhalation once daily, simvastatin 40 mg 1 tablet p.o. at bedtime,  multivitamin.    REVIEW OF SYSTEMS:  CONSTITUTIONAL: Denies any fever, but complaining of fatigue and weakness.  EYES: Denies blurry vision, double vision.  ENT: Denies epistaxis, discharge.  RESPIRATION: Denies cough. COPD.  CARDIOVASCULAR: No chest pain, palpitations.  GASTROINTESTINAL: Complaining of intractable nausea, vomiting. No diarrhea. Complaining of epigastric abdominal pain radiating to  the left upper quadrant. No hematemesis, no melena. GYNECOLOGIC AND BREASTS: Denies breast mass or vaginal discharge.  ENDOCRINE: Denies polyuria, nocturia, thyroid problems. HEMATOLOGIC AND LYMPHATIC: No anemia, easy bruising or bleeding.  INTEGUMENTARY: No acne, rash, lesions.  MUSCULOSKELETAL: No gout. NEUROLOGIC: No vertigo. No ataxia. PSYCHIATRIC: No ADD or OCD.  PHYSICAL EXAMINATION: VITAL SIGNS: Temperature 98 degrees Fahrenheit, pulse 86, respirations 18, blood pressure 146/61, pulse oximetry 97% on room air.  GENERAL APPEARANCE: Not under acute distress. Moderately built and thin-looking Caucasian female, not under acute distress.  HEENT: Normocephalic, atraumatic. Pupils are equally reacting to light and accommodation. No scleral icterus. No conjunctival injection. No sinus tenderness. No postnasal drip. Dry mucous membranes.  NECK: Supple. No JVD. No thyromegaly. Range of motion is intact.  LUNGS: Clear to auscultation bilaterally. No accessory muscle usage. No anterior chest wall tenderness on palpation.  CARDIAC: S1, S2 normal. Regular rate and rhythm. No murmurs.  GASTROINTESTINAL: Soft. Bowel sounds are positive in all 4 quadrants. Nontender, nondistended, except in the epigastric area. No masses felt. No hepatosplenomegaly.  NEUROLOGIC: Awake, alert, oriented x3. Motor and sensory grossly intact. Reflexes are 2+.  EXTREMITIES: No edema. No cyanosis. No clubbing.  SKIN: Warm to touch. Normal turgor. No rashes. No lesions.  MUSCULOSKELETAL: No joint effusion, tenderness, erythema. Peripheral pulses are 2+.  LABORATORY AND IMAGING STUDIES: Glucose 163, BUN 22, creatinine 1.18, GFR 42, anion gap 5, serum osmolality 279, calcium 8.4. LFTs are normal. Cardiac enzymes are normal. CBC normal. Urinalysis: Leukocyte esterase and nitrite are negative. Right upper quadrant ultrasound has revealed unremarkable ultrasound of the right upper quadrant. A 12-lead EKG: Normal sinus rhythm at 86  beats per minute, normal PR and QRS.   ASSESSMENT AND PLAN: An 81 year old female  presenting with epigastric abdominal pain, nausea and vomiting. Will be admitted with the following assessment and plan.   1. Acute epigastric pain with nausea and vomiting, probably from acute pancreatitis, probably alcohol related. Will admit her to medical floor. Will provide her Zofran 4 mg IV q.6 hours. Will keep her n.p.o.  IV fluids. Will check fasting lipid panel in a.m. labs.  2. History of coronary artery disease. The patient being n.p.o., will hold all her home medications.  3. Hypertension. Blood pressure is stable. Will resume her home medication once the patient starts tolerating p.o.  4. Chronic obstructive pulmonary disease, stable at this time. Nebulizer treatments as needed basis.  5. Hyperlipidemia. Continue statin after the patient starts tolerating p.o.   CODE STATUS: She is full code. Daughter is the medical power of attorney.   Diagnosis and plan of care were discussed in detail with the patient. She is aware of the plan.   TOTAL TIME SPENT ON ADMISSION: 45 minutes.   ____________________________ Nicholes Mango, MD ag:lb D: 10/12/2013 05:14:17 ET T: 10/12/2013 05:51:38 ET JOB#: [id number]  cc: Nicholes Mango, MD, <Dictator> Deborra Medina, MD Nicholes Mango MD ELECTRONICALLY SIGNED 10/27/2013 2:04

## 2015-01-10 NOTE — H&P (Signed)
PATIENT NAME:  [name], [name] MR#:  [id number] DATE OF BIRTH:  Jul 04, 1926  DATE OF ADMISSION:  06/17/2014  PRIMARY DOCTOR:  Dr. Deborra Medina.    CARDIOLOGIST: Dr. Ida Rogue.    EMERGENCY ROOM PHYSICIAN: Dr. Jimmye Norman.   CHIEF COMPLAINT: Chest pain.   HISTORY OF PRESENT ILLNESS: An 81 year old female patient with history of coronary artery disease status post CABG in 1999, comes in because of chest pain. The patient noted to have chest pain this morning at 7:00 a.m., mainly in the midsternal region, around 10 out of 10 intensity, pain not radiating to neck or shoulders. No associated diaphoresis, sweating, or nausea. The patient took 4 tablets of 81 mg aspirin and also some nitroglycerin sublingual and chest pain decreased to 4 out of 10 intensity at time she called ambulance.  The patient complaining of exertional dyspnea recently for about a few weeks.   PAST MEDICAL HISTORY: Significant for hypertension, COPD, history of bypass surgery in 1999.   MEDICATIONS AT HOME:  Amlodipine 2.5 mg p.o. daily, omeprazole 40 mg p.o. daily, losartan 100 mg p.o. daily, Plavix 75 mg p.o. daily, simvastatin 40 mg p.o. daily, Spiriva 18 mcg inhalation daily.   ALLERGIES:  No allergies.   SOCIAL HISTORY: Quit smoking 1985. No alcohol. No drugs.   PAST SURGICAL HISTORY: Bypass surgery, tonsillectomy, appendectomy, total abdominal hysterectomy, breast biopsy.   FAMILY HISTORY: Mother had coronary artery disease and according to her she died while doing a stress test in the hospital.   REVIEW OF SYSTEMS:   CONSTITUTIONAL: The patient denies any fever or fatigue.  EYES: No blurred vision.  ENT: No tinnitus. No ear pain. No epistaxis.   RESPIRATION:  Does have some exertional dyspnea recently. Denies any cough or wheezing.  CARDIOVASCULAR: Chest pain this morning. Denies any orthopnea or PND. No palpitations. No syncope.  GASTROINTESTINAL:  No nausea. No vomiting. No abdominal pain. No diarrhea.   GENITOURINARY: No dysuria.  ENDOCRINE: No polyuria or nocturia.  HEMATOLOGIC: No anemia.  INTEGUMENTARY: No skin rashes.  MUSCULOSKELETAL: No joint pain.  NEUROLOGIC: No numbness or weakness.  PSYCHIATRIC: No anxiety or insomnia.   PHYSICAL EXAMINATION:  GENERAL: This is an 81 year old well nourished female, not in distress.   VITAL SIGNS: Temperature is 98.7 Fahrenheit, heart rate 75, blood pressure is 130/80, saturation is 98% on room air.  GENERAL: She is alert, awake, and oriented.  HEAD: Normocephalic, atraumatic.  EYES: Pupils equal, reacting to light. No conjunctival pallor. No scleral icterus. Extraocular movements are intact.  EARS: No drainage. No external lesions.  MOUTH: No lesions. No exudates.  NECK: Supple. No JVD. No carotid bruit. Thyroid in the midline. Normal range of motion.  RESPIRATORY: Good respiratory effort. Clear to auscultation. No wheeze. No rales.  CARDIOVASCULAR: S1, S2, regular. No murmurs. No gallops. The patient's pulses are equal in carotid, femoral, and dorsalis pedis. No peripheral edema.  GASTROINTESTINAL: Abdomen is soft, nontender, nondistended. Bowel sounds present. No hepatosplenomegaly.  MUSCULOSKELETAL: The patient's extremities move x 4. No tenderness or effusion. Range of motion is adequate.  SKIN: Inspection is normal, well-hydrated.  NEUROLOGIC:  Cranial nerves II-XII intact.  DTR  2+ upper and lower extremities. Sensation intact. (power 5/5 upper and lower extremities>>  PSYCHIATRIC: Mood and affect are within normal limits.   LABORATORY DATA:  EKG shows normal sinus rhythm with nonspecific ST-T changes, 76 beats per minute. Electrolytes, sodium 142, potassium 4.3, chloride 107, bicarbonate 28, BUN 22, creatinine 1.42, glucose 115.  LFTs within normal  limits. BNP is 653. Troponin less than 0.02.   Chest x-ray, no active disease. CK total 23, CPK-MB less than 0.5.  INR is 1.    ASSESSMENT AND PLAN:  1.  The patient is an 81 year old female  with chest pain relieved with nitroglycerin, symptoms concerning for unstable angina. Admit to observation status. Continue aspirin, small dose beta blockers, nitroglycerin paste, and continue her home medications, Plavix, and statins, and obtain cardiology consult with Dr. Esmond Plants and Carlton Adam stress test is ordered for tomorrow morning.  2.  Continue to monitor on telemetry, cycle troponins and CK 2 more times.  3.  Chronic obstructive pulmonary disease. The patient is not wheezing at this time, continue Spiriva.  4.  Mild renal insufficiency, is likely to have chronic kidney disease stage III.  Continue losartan.  5.  Gastroesophageal reflux disease.  Continue proton pump inhibitors.   TIME SPENT: About 55 minutes.    ____________________________ Epifanio Lesches, MD sk:bu D: 06/17/2014 12:35:59 ET T: 06/17/2014 13:28:36 ET JOB#: [id number]  cc: Epifanio Lesches, MD, <Dictator> Deborra Medina, MD Minna Merritts, MD Epifanio Lesches MD ELECTRONICALLY SIGNED 06/17/2014 18:36

## 2015-01-10 NOTE — Discharge Summary (Signed)
PATIENT NAME:  [name], [name] MR#:  [id number] DATE OF BIRTH:  06-25-1926  DATE OF ADMISSION:  10/12/2013 DATE OF DISCHARGE:  10/15/2013  ADMITTING DIAGNOSIS: Acute epigastric pain with nausea and vomiting, likely due to pancreatitis due to alcohol.   DISCHARGE DIAGNOSES:  1.  Acute pancreatitis, likely alcoholic, with associated nausea, vomiting, as well as epigastric abdominal pain.  2.  Clostridium difficile enterocolitis.  3.  Dehydration.  4.  Hypotension and renal insufficiency due to dehydration, resolved.  5.  History of coronary artery disease status post coronary artery bypass grafting.  6.  Hypertension.  7.  Hyperlipidemia.  8.  Chronic obstructive pulmonary disease.  3.  Former tobacco abuse.   DISCHARGE CONDITION: Stable.   DISCHARGE MEDICATIONS:  The patient is to continue: 1.  Simvastatin 40 mg p.o. at bedtime.  2.  Plavix 75 mg p.o. daily.  3.  Aspirin 81 mg p.o. daily.  4.  Multivitamins once daily.  5.  Spiriva 1 inhalation daily.  6.  Calcium with vitamin D 500/400, once daily.  7.  Xanax 0.25 mg p.o. twice daily as needed.  8.  Esomeprazole 40 mg p.o. daily.  9.  Metamucil plus calcium 2 capsules once daily as needed.  10.  Metronidazole 500 mg p.o. every 8 hours for 10 more days.  11.  Symbicort 160/4.5, two puffs twice daily.  12.  Combivent Respimat 1 puff four times daily as needed.  13.  Losartan 100 mg p.o. daily. The patient was advised not to take Losartan if diarrhea recurs.   HOME OXYGEN: None.   DIET: Two grams salt, low fat, low cholesterol, regular consistency. The patient was advised to drink plenty of fluids.   ACTIVITY: As tolerated.   FOLLOWUP : Appointment with Dr. Derrel Nip in 2 days after discharge.   CONSULTANTS: Care management, social work.   RADIOLOGIC STUDIES:  1.  Ultrasound of abdomen, limited survey, 24th of January 2015, showed unremarkable ultrasound of right upper quadrant.  2.  Chest x-ray, PA and lateral, the 25th of  January 2015, revealed interstitial pulmonary edema and trace effusions.   BRIEF HOSPITAL COURSE: The patient is an 81 year old Caucasian female with history of ongoing alcohol abuse who presents to the hospital with complaints of nausea, vomiting, as well as epigastric abdominal pains. Please refer to admit notes from Dr. Margaretmary Eddy.   On admission to the hospital, the patient's temperature was 98. Pulse was 86, respiration rate was 18, blood pressure 146/61. Saturation was 97% on room air.   PHYSICAL EXAM: Unremarkable except for epigastric area palpation; however, no masses or hepatosplenomegaly were noted.   The patient's lab data done on admission on the 24th of January 2015 revealed mild elevation of BUN to 22, glucose 163, otherwise BMP was unremarkable. The patient's lipase level was elevated at 1396. The patient's liver enzymes were normal. Cardiac enzymes, the only set, were  negative. TSH was normal at 0.602. White blood cell count was normal at 10.0. Hemoglobin was 13.8 and platelet count was 160.   The patient was admitted to the hospital for further evaluation. She was started on symptomatic therapy for nausea, vomiting, as well as epigastric abdominal pain. She developed diarrhea on later on the 24th of January 2015 which was C. diff positive. Then the patient was initiated on Flagyl. Her diarrhea led her to some dehydration and increase of creatinine level to 1.38 on the 25th of January 2015; however, with advancement of IV fluids, the patient's creatinine normalized already  by the 26th of January 2015.   The patient's diarrhea subsided and her diet was advanced. Her lipase level also normalized already by the 25th of January 2015.  On the day of discharge, the 27th of January 2015, the patient felt satisfactory, did not complain of significant abdominal pains. She was able to eat, full liquid diet, and had 1 episode of loose bowel movement in the morning.   If she is able to tolerate  advanced diet and has no recurrent watery diarrhea, she will likely be discharged home.   She was advised to continue antibiotic therapy with Flagyl for the next 10 days to complete course.   She was also advised to hold losartan if her diarrhea recurs.   She is to follow up with her primary care physician in the next few days after discharge.   Of note, with rehydration, the patient's hemoglobin level dropped down to 10.6 on the 25th of January 2015, however, remained stable until the day of discharge, the 27th of January 2015. It was felt to be rehydration-related.  DISCHARGE VITAL SIGNS: Temperature was 98.7. Pulse was 63 to 70, Respiratory rate was 17 to 18. Blood pressure 150/66. Saturation was 96% on room air at rest.   TIME SPENT: 40 minutes on this patient.   ____________________________ Theodoro Grist, MD rv:np D: 10/15/2013 16:42:37 ET T: 10/15/2013 18:45:17 ET JOB#: [id number]  cc: Theodoro Grist, MD, <Dictator> Deborra Medina, MD [place] MD ELECTRONICALLY SIGNED 10/28/2013 14:21

## 2015-02-04 ENCOUNTER — Other Ambulatory Visit: Payer: Self-pay | Admitting: Internal Medicine

## 2015-02-04 DIAGNOSIS — E785 Hyperlipidemia, unspecified: Secondary | ICD-10-CM

## 2015-02-04 DIAGNOSIS — Z1159 Encounter for screening for other viral diseases: Secondary | ICD-10-CM

## 2015-02-04 DIAGNOSIS — Z79899 Other long term (current) drug therapy: Secondary | ICD-10-CM

## 2015-02-04 DIAGNOSIS — I700 Atherosclerosis of aorta: Secondary | ICD-10-CM

## 2015-02-04 DIAGNOSIS — I10 Essential (primary) hypertension: Secondary | ICD-10-CM

## 2015-02-04 DIAGNOSIS — I2510 Atherosclerotic heart disease of native coronary artery without angina pectoris: Secondary | ICD-10-CM

## 2015-02-04 DIAGNOSIS — R001 Bradycardia, unspecified: Secondary | ICD-10-CM

## 2015-02-04 NOTE — Telephone Encounter (Signed)
Last OV 07/09/14, ALPRAZolam (XANAX) 0.25 MG tablet last filled on 06/24/14.  Okay to refill?

## 2015-02-05 NOTE — Telephone Encounter (Signed)
Patient scheduled for follow up.

## 2015-02-05 NOTE — Telephone Encounter (Signed)
Refill one 30 days only.  Has not been seen in 6 months so she needs a  Visit  Also needs  to make a fasting labs appt.  The labs have been ordered.

## 2015-02-09 ENCOUNTER — Encounter: Payer: Self-pay | Admitting: Internal Medicine

## 2015-02-09 ENCOUNTER — Ambulatory Visit (INDEPENDENT_AMBULATORY_CARE_PROVIDER_SITE_OTHER): Payer: Medicare Other | Admitting: Internal Medicine

## 2015-02-09 VITALS — BP 140/70 | HR 64 | Temp 98.1°F | Resp 16 | Ht 63.5 in | Wt 131.8 lb

## 2015-02-09 DIAGNOSIS — E785 Hyperlipidemia, unspecified: Secondary | ICD-10-CM

## 2015-02-09 DIAGNOSIS — E559 Vitamin D deficiency, unspecified: Secondary | ICD-10-CM | POA: Diagnosis not present

## 2015-02-09 DIAGNOSIS — R634 Abnormal weight loss: Secondary | ICD-10-CM | POA: Diagnosis not present

## 2015-02-09 DIAGNOSIS — I10 Essential (primary) hypertension: Secondary | ICD-10-CM | POA: Diagnosis not present

## 2015-02-09 DIAGNOSIS — R001 Bradycardia, unspecified: Secondary | ICD-10-CM | POA: Diagnosis not present

## 2015-02-09 DIAGNOSIS — G4700 Insomnia, unspecified: Secondary | ICD-10-CM

## 2015-02-09 DIAGNOSIS — I6523 Occlusion and stenosis of bilateral carotid arteries: Secondary | ICD-10-CM

## 2015-02-09 LAB — CBC WITH DIFFERENTIAL/PLATELET
Basophils Absolute: 0 10*3/uL (ref 0.0–0.1)
Basophils Relative: 0.7 % (ref 0.0–3.0)
EOS ABS: 0.2 10*3/uL (ref 0.0–0.7)
EOS PCT: 3.6 % (ref 0.0–5.0)
HCT: 40.8 % (ref 36.0–46.0)
Hemoglobin: 13.7 g/dL (ref 12.0–15.0)
LYMPHS ABS: 1.4 10*3/uL (ref 0.7–4.0)
LYMPHS PCT: 22.4 % (ref 12.0–46.0)
MCHC: 33.6 g/dL (ref 30.0–36.0)
MCV: 96.5 fl (ref 78.0–100.0)
MONOS PCT: 9.6 % (ref 3.0–12.0)
Monocytes Absolute: 0.6 10*3/uL (ref 0.1–1.0)
Neutro Abs: 4.1 10*3/uL (ref 1.4–7.7)
Neutrophils Relative %: 63.7 % (ref 43.0–77.0)
Platelets: 184 10*3/uL (ref 150.0–400.0)
RBC: 4.23 Mil/uL (ref 3.87–5.11)
RDW: 13.4 % (ref 11.5–15.5)
WBC: 6.4 10*3/uL (ref 4.0–10.5)

## 2015-02-09 LAB — COMPREHENSIVE METABOLIC PANEL
ALK PHOS: 60 U/L (ref 39–117)
ALT: 13 U/L (ref 0–35)
AST: 16 U/L (ref 0–37)
Albumin: 4.1 g/dL (ref 3.5–5.2)
BUN: 18 mg/dL (ref 6–23)
CO2: 30 meq/L (ref 19–32)
CREATININE: 1.19 mg/dL (ref 0.40–1.20)
Calcium: 9.1 mg/dL (ref 8.4–10.5)
Chloride: 106 mEq/L (ref 96–112)
GFR: 45.66 mL/min — ABNORMAL LOW (ref >60.00)
GLUCOSE: 105 mg/dL — AB (ref 70–99)
POTASSIUM: 4.7 meq/L (ref 3.5–5.1)
SODIUM: 141 meq/L (ref 135–145)
Total Bilirubin: 0.6 mg/dL (ref 0.2–1.2)
Total Protein: 6.2 g/dL (ref 6.0–8.3)

## 2015-02-09 LAB — VITAMIN D 25 HYDROXY (VIT D DEFICIENCY, FRACTURES): VITD: 38.3 ng/mL (ref 30.00–100.00)

## 2015-02-09 LAB — LIPID PANEL
CHOL/HDL RATIO: 3
Cholesterol: 142 mg/dL (ref 0–200)
HDL: 48.4 mg/dL (ref >39.00)
LDL Cholesterol: 66 mg/dL (ref 0–99)
NonHDL: 93.6
Triglycerides: 136 mg/dL (ref 0.0–149.0)
VLDL: 27.2 mg/dL (ref 0.0–40.0)

## 2015-02-09 LAB — TSH: TSH: 1.44 u[IU]/mL (ref 0.35–4.50)

## 2015-02-09 MED ORDER — LOSARTAN POTASSIUM 100 MG PO TABS: 100.0000 mg | ORAL_TABLET | Freq: Every day | ORAL | Status: DC

## 2015-02-09 MED ORDER — CLOPIDOGREL BISULFATE 75 MG PO TABS: 75.0000 mg | ORAL_TABLET | Freq: Every day | ORAL | Status: DC

## 2015-02-09 MED ORDER — AMLODIPINE BESYLATE 2.5 MG PO TABS: 2.5000 mg | ORAL_TABLET | Freq: Every day | ORAL | Status: DC

## 2015-02-09 NOTE — Progress Notes (Signed)
Pre-visit discussion using our clinic review tool. No additional management support is needed unless otherwise documented below in the visit note.  

## 2015-02-09 NOTE — Assessment & Plan Note (Signed)
Well controlled on current regimen. Renal function is due, no changes today.  Lab Results  Component Value Date   CREATININE 1.38* 06/18/2014   Lab Results  Component Value Date   NA 143 06/18/2014   K 4.4 06/18/2014   CL 107 06/18/2014   CO2 31 06/18/2014

## 2015-02-09 NOTE — Assessment & Plan Note (Signed)
5 lb wt loss sicne January is unintentional.   I have reviewed her diet and recommended that she increase her protein and fat intake while monitoring her carbohydrates.

## 2015-02-09 NOTE — Assessment & Plan Note (Signed)
Ok to increase alprazolam to 0.5 mg as a trial.  Of not helpful,  Trial of remeron given decreased appetite.

## 2015-02-09 NOTE — Patient Instructions (Addendum)
Try increasing the alprazolam to 0.5 mg mg at bedtime (2 tablets)  For your insomnia.  If this does  not work ,  Let me know    I recommend getting the majority of your calcium and Vitamin D  through diet rather than supplements given the recent association of calcium supplements with increased coronary artery calcium scores  Try the Silk almond Light,  Original formula.  Delicious,  Low carb,  Low cal,  Cholesterol free  I don't  Want you to lose any more weight.  Try supplementing diet  with a drink like ensure,   Boost or Premier Protein , even you are not hungry .    You can try using Debrox drops in your ears as needed for ear wax

## 2015-02-09 NOTE — Progress Notes (Signed)
Subjective:  Patient ID: [name], female    DOB: July 23, 1926  Age: 81 y.o. MRN: [id number]  CC: The primary encounter diagnosis was Essential hypertension. Diagnoses of Hyperlipidemia, Sinus bradycardia, Vitamin D deficiency, Loss of weight, and Insomnia, persistent were also pertinent to this visit.  HPI OHARA BROSH presents for follow up on hypertension, hyperlipidemia, COPD and history of c dificile colitis .  Last seen In October. Still having chronc insomnia  Did not tolerate trazodone due to excessive dry mouth,  Low dose (0.25 mg ) Alprazolam not  Helping.  Was at the beach with some friends and tried her friend's generic ambien and slept 5 hours  Weight loss of 5 lbs since January noted.  Patient reports decreased appetite,  Doesn't like to cook for one . skps meals  Few times per week.  Still golfing   Outpatient Prescriptions Prior to Visit  Medication Sig Dispense Refill  . aspirin 81 MG tablet Take 81 mg by mouth daily.      . calcium-vitamin D (SM CALCIUM 500/VITAMIN D3) 500-400 MG-UNIT per tablet Take 1 tablet by mouth daily.      Marland Kitchen esomeprazole (NEXIUM) 40 MG capsule TAKE 1 CAPSULE DAILY BEFORE BREAKFAST 90 capsule 1  . multivitamin (THERAGRAN) per tablet Take 1 tablet by mouth daily.      . nitroGLYCERIN (NITROSTAT) 0.4 MG SL tablet Place 1 tablet (0.4 mg total) under the tongue every 5 (five) minutes as needed for chest pain. 25 tablet 6  . Psyllium-Calcium (METAMUCIL PLUS CALCIUM) CAPS Take 2 capsules by mouth daily as needed.     . simvastatin (ZOCOR) 40 MG tablet TAKE ONE TABLET AT BEDTIME 90 tablet 3  . tiotropium (SPIRIVA HANDIHALER) 18 MCG inhalation capsule Place 1 capsule (18 mcg total) into inhaler and inhale daily. NEEDS APPT WITH DR MCQUAID 30 capsule 2  . amLODipine (NORVASC) 2.5 MG tablet TAKE 1 TABLET EVERY DAY 30 tablet 0  . clopidogrel (PLAVIX) 75 MG tablet TAKE 1 TABLET EVERY DAY 90 tablet 1  . losartan (COZAAR) 100 MG tablet TAKE 1 TABLET  EVERY DAY 30 tablet 5  . ALPRAZolam (XANAX) 0.25 MG tablet TAKE ONE TABLET AT BEDTIME AS NEEDED FORANXIETY (Patient not taking: Reported on 02/09/2015) 30 tablet 0  . hyoscyamine (LEVSIN SL) 0.125 MG SL tablet Place 1 tablet (0.125 mg total) under the tongue every 4 (four) hours as needed. (Patient not taking: Reported on 02/09/2015) 30 tablet 0  . isosorbide mononitrate (IMDUR) 30 MG 24 hr tablet Take 30 mg by mouth daily.    . traZODone (DESYREL) 50 MG tablet Take 1 tablet (50 mg total) by mouth at bedtime as needed for sleep. (Patient not taking: Reported on 02/09/2015) 30 tablet 1   No facility-administered medications prior to visit.    Review of Systems;  Patient denies headache, fevers, malaise, skin rash, eye pain, sinus congestion and sinus pain, sore throat, dysphagia,  hemoptysis , cough, dyspnea, wheezing, chest pain, palpitations, orthopnea, edema, abdominal pain, nausea, melena, diarrhea, constipation, flank pain, dysuria, hematuria, urinary  Frequency, nocturia, numbness, tingling, seizures,  Focal weakness, Loss of consciousness,  Tremor, insomnia, depression, anxiety, and suicidal ideation.      Objective:  BP 140/70 mmHg  Pulse 64  Temp(Src) 98.1 F (36.7 C) (Oral)  Resp 16  Ht 5' 3.5" (1.613 m)  Wt 131 lb 12 oz (59.761 kg)  BMI 22.97 kg/m2  SpO2 97%  BP Readings from Last 3 Encounters:  02/09/15 140/70  11/24/14 122/62  10/07/14 110/60    Wt Readings from Last 3 Encounters:  02/09/15 131 lb 12 oz (59.761 kg)  11/24/14 133 lb (60.328 kg)  10/07/14 135 lb 4 oz (61.349 kg)    General appearance: alert, cooperative and appears stated age Ears: normal TM's and external ear canals both ears Throat: lips, mucosa, and tongue normal; teeth and gums normal Neck: no adenopathy, no carotid bruit, supple, symmetrical, trachea midline and thyroid not enlarged, symmetric, no tenderness/mass/nodules Back: symmetric, no curvature. ROM normal. No CVA tenderness. Lungs: clear  to auscultation bilaterally Heart: regular rate and rhythm, S1, S2 normal, no murmur, click, rub or gallop Abdomen: soft, non-tender; bowel sounds normal; no masses,  no organomegaly Pulses: 2+ and symmetric Skin: Skin color, texture, turgor normal. No rashes or lesions Lymph nodes: Cervical, supraclavicular, and axillary nodes normal.   Assessment & Plan:   Problem List Items Addressed This Visit    Insomnia, persistent    Ok to increase alprazolam to 0.5 mg as a trial.  Of not helpful,  Trial of remeron given decreased appetite.       Essential hypertension - Primary    Well controlled on current regimen. Renal function is due, no changes today.  Lab Results  Component Value Date   CREATININE 1.38* 06/18/2014   Lab Results  Component Value Date   NA 143 06/18/2014   K 4.4 06/18/2014   CL 107 06/18/2014   CO2 31 06/18/2014         Relevant Medications   amLODipine (NORVASC) 2.5 MG tablet   losartan (COZAAR) 100 MG tablet   Loss of weight    5 lb wt loss sicne January is unintentional.   I have reviewed her diet and recommended that she increase her protein and fat intake while monitoring her carbohydrates.       Relevant Orders   CBC with Differential/Platelet   Comprehensive metabolic panel   TSH   Hyperlipidemia   Relevant Medications   amLODipine (NORVASC) 2.5 MG tablet   losartan (COZAAR) 100 MG tablet   Other Relevant Orders   Lipid panel   Sinus bradycardia   Relevant Medications   amLODipine (NORVASC) 2.5 MG tablet   losartan (COZAAR) 100 MG tablet    Other Visit Diagnoses    Vitamin D deficiency        Relevant Orders    Vit D  25 hydroxy (rtn osteoporosis monitoring)       I have changed Ms. Innocent's amLODipine, clopidogrel, and losartan. I am also having her maintain her aspirin, calcium-vitamin D, METAMUCIL PLUS CALCIUM, multivitamin, esomeprazole, tiotropium, isosorbide mononitrate, nitroGLYCERIN, hyoscyamine, traZODone, simvastatin, and  ALPRAZolam.  Meds ordered this encounter  Medications  . amLODipine (NORVASC) 2.5 MG tablet    Sig: Take 1 tablet (2.5 mg total) by mouth daily.    Dispense:  90 tablet    Refill:  1  . clopidogrel (PLAVIX) 75 MG tablet    Sig: Take 1 tablet (75 mg total) by mouth daily.    Dispense:  90 tablet    Refill:  1  . losartan (COZAAR) 100 MG tablet    Sig: Take 1 tablet (100 mg total) by mouth daily.    Dispense:  90 tablet    Refill:  1    Medications Discontinued During This Encounter  Medication Reason  . amLODipine (NORVASC) 2.5 MG tablet Reorder  . clopidogrel (PLAVIX) 75 MG tablet Reorder  . losartan (COZAAR) 100 MG tablet Reorder  Follow-up: Return in about 6 months (around 08/12/2015).   [name], MD

## 2015-02-11 ENCOUNTER — Encounter: Payer: Self-pay | Admitting: Internal Medicine

## 2015-02-11 DIAGNOSIS — Z85828 Personal history of other malignant neoplasm of skin: Secondary | ICD-10-CM | POA: Diagnosis not present

## 2015-02-11 DIAGNOSIS — X32XXXA Exposure to sunlight, initial encounter: Secondary | ICD-10-CM | POA: Diagnosis not present

## 2015-02-11 DIAGNOSIS — L821 Other seborrheic keratosis: Secondary | ICD-10-CM | POA: Diagnosis not present

## 2015-02-11 DIAGNOSIS — L570 Actinic keratosis: Secondary | ICD-10-CM | POA: Diagnosis not present

## 2015-03-02 ENCOUNTER — Other Ambulatory Visit: Payer: Self-pay | Admitting: Internal Medicine

## 2015-03-02 NOTE — Telephone Encounter (Signed)
Faxed to pharmacy

## 2015-03-02 NOTE — Telephone Encounter (Signed)
Ok to refill,  printed rx  

## 2015-03-02 NOTE — Telephone Encounter (Signed)
See pharmacy message, pt states taking 0.5 mg at bedtime now, 02/09/15 confirms dose was increased to 0.5 mg at bedtime. Ok to send new Rx for 0.5 mg Alprazolam? Last visit 02/09/15

## 2015-04-13 ENCOUNTER — Ambulatory Visit (INDEPENDENT_AMBULATORY_CARE_PROVIDER_SITE_OTHER): Payer: Medicare Other | Admitting: Cardiovascular Disease

## 2015-04-13 ENCOUNTER — Encounter: Payer: Self-pay | Admitting: Cardiovascular Disease

## 2015-04-13 VITALS — BP 120/62 | HR 61 | Ht 63.5 in | Wt 131.0 lb

## 2015-04-13 DIAGNOSIS — J438 Other emphysema: Secondary | ICD-10-CM | POA: Diagnosis not present

## 2015-04-13 DIAGNOSIS — I2510 Atherosclerotic heart disease of native coronary artery without angina pectoris: Secondary | ICD-10-CM | POA: Diagnosis not present

## 2015-04-13 DIAGNOSIS — I739 Peripheral vascular disease, unspecified: Secondary | ICD-10-CM

## 2015-04-13 DIAGNOSIS — I779 Disorder of arteries and arterioles, unspecified: Secondary | ICD-10-CM | POA: Diagnosis not present

## 2015-04-13 DIAGNOSIS — I6523 Occlusion and stenosis of bilateral carotid arteries: Secondary | ICD-10-CM

## 2015-04-13 DIAGNOSIS — I10 Essential (primary) hypertension: Secondary | ICD-10-CM

## 2015-04-13 DIAGNOSIS — E785 Hyperlipidemia, unspecified: Secondary | ICD-10-CM

## 2015-04-13 DIAGNOSIS — Z951 Presence of aortocoronary bypass graft: Secondary | ICD-10-CM

## 2015-04-13 NOTE — Assessment & Plan Note (Signed)
Shortness of breath with stairs, atypical left-sided chest pain discussed with her. Recommended if she has worsening symptoms that she call our office. Would also start isosorbide if she has worsening symptoms. We decreased losartan down to 50 mg to avoid hypotension. if symptoms get worse, discussed doing testing such as stress testing or catheterization

## 2015-04-13 NOTE — Assessment & Plan Note (Signed)
Vague symptoms as above. Will monitor closely.  Stress test September 2015 with no ischemia

## 2015-04-13 NOTE — Patient Instructions (Signed)
You are doing well. No medication changes were made.  If you start to have angina, Take the isosorbide one a day But cut back on the losartan to 1/2 pill a day  If you have more dizzy epsiodes, Cut the losartan in 1/2 daily (50 mg)  Please call us if you have new issues that need to be addressed before your next appt.  Your physician wants you to follow-up in: 6 months.  You will receive a reminder letter in the mail two months in advance. If you don't receive a letter, please call our office to schedule the follow-up appointment.  '

## 2015-04-13 NOTE — Assessment & Plan Note (Signed)
She has follow-up with Dr. Jamal Collin in August Symptoms currently stable

## 2015-04-13 NOTE — Progress Notes (Signed)
Patient ID: [name], female    DOB: Jan 20, 1926, 81 y.o.   MRN: [id number]  HPI Comments: [name] is a pleasant 81 year old woman with a history of CAD, bypass surgery in 1999, long history of smoking for 40 years who quit in 1985, COPD, chronic renal insufficiency, hypertension, hyperlipidemia, 40-50% bilateral carotid arterial disease who presents for routine followup of her coronary artery disease  She has not had any intervention since her bypass surgery. No cardiac catheterizations, no stent placement.  C. difficile in January 2015. She was in the hospital for several days.  On today's visit, she reports that she is doing very well.  She does report having some shortness of breath when she climbs stairs . Similar symptoms to her prior anginal equivalent .  Some discomfort in her left shoulder area typically at rest  Sometimes has some lightheadedness She is using the treadmill, playing golf on a regular basis. She's not taking isosorbide   EKG on today's visit shows normal sinus rhythm with rate 61 bpm, nonspecific T wave abnormality through the anterior precordial leads, unchanged from prior EKG  Other past medical history  Last stress test was October 2011. Carotid ultrasound showing 40-59% disease  Prior lab work showing total cholesterol 140, LDL 62   Allergies  Allergen Reactions  . Codeine Nausea And Vomiting    Outpatient Encounter Prescriptions as of 04/13/2015  Medication Sig  . ALPRAZolam (XANAX) 0.25 MG tablet 1 to 2 tablets at bedtime as needed for insomnia  . amLODipine (NORVASC) 2.5 MG tablet Take 1 tablet (2.5 mg total) by mouth daily.  Marland Kitchen aspirin 81 MG tablet Take 81 mg by mouth daily.    . calcium-vitamin D (SM CALCIUM 500/VITAMIN D3) 500-400 MG-UNIT per tablet Take 1 tablet by mouth daily.    . clopidogrel (PLAVIX) 75 MG tablet Take 1 tablet (75 mg total) by mouth daily.  Marland Kitchen esomeprazole (NEXIUM) 40 MG capsule TAKE 1 CAPSULE DAILY BEFORE  BREAKFAST  . hyoscyamine (LEVSIN SL) 0.125 MG SL tablet Place 1 tablet (0.125 mg total) under the tongue every 4 (four) hours as needed.  . isosorbide mononitrate (IMDUR) 30 MG 24 hr tablet Take 30 mg by mouth as needed.   Marland Kitchen losartan (COZAAR) 100 MG tablet Take 1 tablet (100 mg total) by mouth daily.  . multivitamin (THERAGRAN) per tablet Take 1 tablet by mouth daily.    . nitroGLYCERIN (NITROSTAT) 0.4 MG SL tablet Place 1 tablet (0.4 mg total) under the tongue every 5 (five) minutes as needed for chest pain.  Marland Kitchen Psyllium-Calcium (METAMUCIL PLUS CALCIUM) CAPS Take 2 capsules by mouth daily as needed.   . simvastatin (ZOCOR) 40 MG tablet TAKE ONE TABLET AT BEDTIME  . tiotropium (SPIRIVA HANDIHALER) 18 MCG inhalation capsule Place 1 capsule (18 mcg total) into inhaler and inhale daily. NEEDS APPT WITH DR Lake Bells (Patient taking differently: Place 18 mcg into inhaler and inhale as needed. NEEDS APPT WITH DR Lake Bells)  . [DISCONTINUED] traZODone (DESYREL) 50 MG tablet Take 1 tablet (50 mg total) by mouth at bedtime as needed for sleep. (Patient not taking: Reported on 04/13/2015)   No facility-administered encounter medications on file as of 04/13/2015.    Past Medical History  Diagnosis Date  . Carotid artery disease     Doppler January, 2012, 40-59% bilateral, followup one year  . Hyperlipidemia   . Hypertension   . SOB (shortness of breath)   . Dizziness     stable now (Oct 2011) patient  tells me question of TIA, we will obtain records  . Syncopal episodes     after 18 holes of golf and increase hydrochlorothiazide, resolved   . Indigestion     3 weeks, October 2011  . CAD (coronary artery disease)     a. Nuclear 10/11, not gated, no scar or ischemia, EF 68%, low risk study; b. [place] 06/18/14: no evidence of infarction or ischemia, no WMA, normalization of TWI in precordial leads, equivocal EKG changes EF 77%, low risk study  . Rheumatic fever   . History of migraines   . Kidney  stones   . GERD (gastroesophageal reflux disease)   . Diverticulitis     last colonoscopy  incomplete Jan 2011  . Hx of CABG     a. 3 vessel in 1999  . Gait abnormality     Patient complains of "wobbly gait", December, 2013  . Sinus bradycardia     Mild, December, 2013  . Diffuse cystic mastopathy   . C. difficile colitis     Past Surgical History  Procedure Laterality Date  . Breast biopsy  1970  . Esophagus stretched    . Appendectomy  1950  . Coronary artery bypass graft  1999  . Tonsillectomy  1939  . Total abdominal hysterectomy  1968    due to metrorrhagia  . Cataract extraction Right 2009  . Cataract extraction Left 2011  . Colonoscopy  [id number]    Dr. Jamal Collin    Social History  reports that she quit smoking about 31 years ago. Her smoking use included Cigarettes. She has a 20 pack-year smoking history. She has never used smokeless tobacco. She reports that she drinks alcohol. She reports that she does not use illicit drugs.  Family History family history includes Heart disease in her father and mother. There is no history of Cancer or Drug abuse.   Review of Systems  Constitutional: Negative.   Respiratory: Negative.   Cardiovascular: Negative.   Gastrointestinal: Negative.   Musculoskeletal: Negative.   Skin: Negative.   Allergic/Immunologic: Negative.   Neurological: Negative.   Hematological: Negative.   Psychiatric/Behavioral: Negative.   All other systems reviewed and are negative.   BP 120/62 mmHg  Pulse 61  Ht 5' 3.5" (1.613 m)  Wt 131 lb (59.421 kg)  BMI 22.84 kg/m2  Physical Exam  Constitutional: She is oriented to person, place, and time. She appears well-developed and well-nourished.  HENT:  Head: Normocephalic.  Nose: Nose normal.  Mouth/Throat: Oropharynx is clear and moist.  Eyes: Conjunctivae are normal. Pupils are equal, round, and reactive to light.  Neck: Normal range of motion. Neck supple. No JVD present.  Cardiovascular:  Normal rate, regular rhythm, S1 normal, S2 normal, normal heart sounds and intact distal pulses.  Exam reveals no gallop and no friction rub.   No murmur heard. Pulmonary/Chest: Effort normal and breath sounds normal. No respiratory distress. She has no wheezes. She has no rales. She exhibits no tenderness.  Abdominal: Soft. Bowel sounds are normal. She exhibits no distension. There is no tenderness.  Musculoskeletal: Normal range of motion. She exhibits no edema or tenderness.  Lymphadenopathy:    She has no cervical adenopathy.  Neurological: She is alert and oriented to person, place, and time. Coordination normal.  Skin: Skin is warm and dry. No rash noted. No erythema.  Psychiatric: She has a normal mood and affect. Her behavior is normal. Judgment and thought content normal.    Assessment and Plan  Nursing  note and vitals reviewed.

## 2015-04-13 NOTE — Assessment & Plan Note (Signed)
Cholesterol is at goal on the current lipid regimen. No changes to the medications were made.  

## 2015-04-13 NOTE — Assessment & Plan Note (Signed)
Stable carotid arterial disease. Continue aggressive cholesterol management

## 2015-04-13 NOTE — Assessment & Plan Note (Signed)
Some recent lightheaded spells. Encouraged fluid hydration. If symptoms get worse would decrease the losartan down to 50 mg daily

## 2015-04-20 ENCOUNTER — Institutional Professional Consult (permissible substitution): Payer: PRIVATE HEALTH INSURANCE | Admitting: Pulmonary Disease

## 2015-05-04 ENCOUNTER — Ambulatory Visit (INDEPENDENT_AMBULATORY_CARE_PROVIDER_SITE_OTHER): Payer: Medicare Other | Admitting: Pulmonary Disease

## 2015-05-04 ENCOUNTER — Encounter: Payer: Self-pay | Admitting: Pulmonary Disease

## 2015-05-04 VITALS — BP 136/72 | HR 76 | Ht 63.5 in | Wt 130.0 lb

## 2015-05-04 DIAGNOSIS — J438 Other emphysema: Secondary | ICD-10-CM | POA: Diagnosis not present

## 2015-05-04 DIAGNOSIS — I6523 Occlusion and stenosis of bilateral carotid arteries: Secondary | ICD-10-CM | POA: Diagnosis not present

## 2015-05-04 NOTE — Progress Notes (Signed)
Subjective:    Patient ID: [name], female    DOB: Dec 03, 1925, 81 y.o.   MRN: [id number]  Synopsis: The a [place] first came to the [facility] pulmonary office in March 2013 for evaluation of COPD. Spirometry at that time showed moderate obstruction. She smoked one half pack of cigarettes daily for 40 years and quit in 1985. Based on her symptoms and spirometry she was listed as gold grade A.  HPI 06/04/14 ROV > [name] had a sinus infectoin recntly and was seen by Dr. Tami Ribas or the same.  He prescribed flonase, prednisone and a zpack.  She decided to not take the zpack because a year ago she had c.diff.  She has persistant sinus congestion at night but she is better.  She is not having shortness of breath, facial pain, no fever.   05/04/15 ROV: no new complaints. Remains very active. Golfs twice a week. Minimal exercise intolerance. No cough or sputum production. Reports that she often does not take her Spiriva and cannot tell any difference whether she taks it or not.    Past Medical History  Diagnosis Date  . Carotid artery disease     Doppler January, 2012, 40-59% bilateral, followup one year  . Hyperlipidemia   . Hypertension   . SOB (shortness of breath)   . Dizziness     stable now (Oct 2011) patient tells me question of TIA, we will obtain records  . Syncopal episodes     after 18 holes of golf and increase hydrochlorothiazide, resolved   . Indigestion     3 weeks, October 2011  . CAD (coronary artery disease)     a. Nuclear 10/11, not gated, no scar or ischemia, EF 68%, low risk study; b. [place] 06/18/14: no evidence of infarction or ischemia, no WMA, normalization of TWI in precordial leads, equivocal EKG changes EF 77%, low risk study  . Rheumatic fever   . History of migraines   . Kidney stones   . GERD (gastroesophageal reflux disease)   . Diverticulitis     last colonoscopy  incomplete Jan 2011  . Hx of CABG     a. 3 vessel in 1999  . Gait  abnormality     Patient complains of "wobbly gait", December, 2013  . Sinus bradycardia     Mild, December, 2013  . Diffuse cystic mastopathy   . C. difficile colitis       Review of Systems  Constitutional: Negative for fever, chills and unexpected weight change.  HENT: Negative for congestion, postnasal drip, rhinorrhea, sinus pressure and sneezing.   Respiratory: Negative for cough, choking and shortness of breath.   Cardiovascular: Negative for chest pain and leg swelling.       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 There appears to be little or no benefit from Tiotropium inhaler. Further, she never uses a rescue inhaler  I have given medical approval to a trial off of Spiriva (tiotropium). If she cannot tell any difference in respiratory symptoms whether she is on or off it, there is no  good reason for her to continue to take this (expensive) medication.  She is extremely well-compensated given the extent of airflow obstruction. This is attributable to the fact that she has remained active, both physically and mentally. I have commended her on this and encouraged her to remain so. She is to follow up as needed for recurrent symptoms or as directed by Dr [name]   Updated Medication List Outpatient Encounter Prescriptions as of 05/04/2015  Medication Sig  . ALPRAZolam (XANAX) 0.25 MG tablet 1 to 2 tablets at bedtime as needed for insomnia  . amLODipine (NORVASC) 2.5 MG tablet Take 1 tablet (2.5 mg total) by mouth daily.  Marland Kitchen aspirin 81 MG tablet Take 81 mg by mouth daily.    . calcium-vitamin D (SM CALCIUM 500/VITAMIN D3) 500-400 MG-UNIT per  tablet Take 1 tablet by mouth daily.    . clopidogrel (PLAVIX) 75 MG tablet Take 1 tablet (75 mg total) by mouth daily.  Marland Kitchen esomeprazole (NEXIUM) 40 MG capsule TAKE 1 CAPSULE DAILY BEFORE BREAKFAST  . hyoscyamine (LEVSIN SL) 0.125 MG SL tablet Place 1 tablet (0.125 mg total) under the tongue every 4 (four) hours as needed.  . isosorbide mononitrate (IMDUR) 30 MG 24 hr tablet Take 30 mg by mouth as needed.   Marland Kitchen losartan (COZAAR) 100 MG tablet Take 1 tablet (100 mg total) by mouth daily.  . multivitamin (THERAGRAN) per tablet Take 1 tablet by mouth daily.    . nitroGLYCERIN (NITROSTAT) 0.4 MG SL tablet Place 1 tablet (0.4 mg total) under the tongue every 5 (five) minutes as needed for chest pain.  Marland Kitchen Psyllium-Calcium (METAMUCIL PLUS CALCIUM) CAPS Take 2 capsules by mouth daily as needed.   . simvastatin (ZOCOR) 40 MG tablet TAKE ONE TABLET AT BEDTIME  . tiotropium (SPIRIVA HANDIHALER) 18 MCG inhalation capsule Place 1 capsule (18 mcg total) into inhaler and inhale daily. NEEDS APPT WITH DR [name] (Patient taking differently: Place 18 mcg into inhaler and inhale as needed. NEEDS APPT WITH DR [name])   No facility-administered encounter medications on file as of 05/04/2015.

## 2015-05-04 NOTE — Patient Instructions (Signed)
You may remain off of the Spiriva if you don't notice any difference in your breathing symptoms. It will take about a week off of the medication to be able to tell for certain. If you are unsure whether it is helping, resume it and see if you can tell whether there is any improvement. Otherwise, keep up the good work  Merton Border, MD

## 2015-05-22 ENCOUNTER — Other Ambulatory Visit: Payer: Self-pay

## 2015-05-22 DIAGNOSIS — I350 Nonrheumatic aortic (valve) stenosis: Secondary | ICD-10-CM

## 2015-05-22 DIAGNOSIS — R011 Cardiac murmur, unspecified: Secondary | ICD-10-CM

## 2015-05-22 DIAGNOSIS — I340 Nonrheumatic mitral (valve) insufficiency: Secondary | ICD-10-CM

## 2015-05-29 ENCOUNTER — Ambulatory Visit (INDEPENDENT_AMBULATORY_CARE_PROVIDER_SITE_OTHER): Payer: Medicare Other

## 2015-05-29 ENCOUNTER — Other Ambulatory Visit: Payer: Self-pay

## 2015-05-29 DIAGNOSIS — I350 Nonrheumatic aortic (valve) stenosis: Secondary | ICD-10-CM | POA: Diagnosis not present

## 2015-05-29 DIAGNOSIS — R011 Cardiac murmur, unspecified: Secondary | ICD-10-CM

## 2015-05-29 DIAGNOSIS — I340 Nonrheumatic mitral (valve) insufficiency: Secondary | ICD-10-CM | POA: Diagnosis not present

## 2015-06-04 ENCOUNTER — Other Ambulatory Visit: Payer: Self-pay

## 2015-06-04 MED ORDER — TIOTROPIUM BROMIDE MONOHYDRATE 18 MCG IN CAPS: 18.0000 ug | ORAL_CAPSULE | Freq: Every day | RESPIRATORY_TRACT | Status: DC

## 2015-07-07 ENCOUNTER — Telehealth: Payer: Self-pay | Admitting: Internal Medicine

## 2015-07-07 NOTE — Telephone Encounter (Signed)
Wanting an appointment with Dr Derrel Nip having kidney problems. She would not give me any more details.

## 2015-07-08 NOTE — Telephone Encounter (Signed)
Patient scheduled for Friday at 4pm

## 2015-07-09 DIAGNOSIS — C44729 Squamous cell carcinoma of skin of left lower limb, including hip: Secondary | ICD-10-CM | POA: Diagnosis not present

## 2015-07-09 DIAGNOSIS — L821 Other seborrheic keratosis: Secondary | ICD-10-CM | POA: Diagnosis not present

## 2015-07-09 DIAGNOSIS — D2261 Melanocytic nevi of right upper limb, including shoulder: Secondary | ICD-10-CM | POA: Diagnosis not present

## 2015-07-09 DIAGNOSIS — D225 Melanocytic nevi of trunk: Secondary | ICD-10-CM | POA: Diagnosis not present

## 2015-07-09 DIAGNOSIS — D485 Neoplasm of uncertain behavior of skin: Secondary | ICD-10-CM | POA: Diagnosis not present

## 2015-07-09 DIAGNOSIS — Z23 Encounter for immunization: Secondary | ICD-10-CM | POA: Diagnosis not present

## 2015-07-09 DIAGNOSIS — D2272 Melanocytic nevi of left lower limb, including hip: Secondary | ICD-10-CM | POA: Diagnosis not present

## 2015-07-10 ENCOUNTER — Ambulatory Visit (INDEPENDENT_AMBULATORY_CARE_PROVIDER_SITE_OTHER): Payer: Medicare Other | Admitting: Internal Medicine

## 2015-07-10 ENCOUNTER — Encounter: Payer: Self-pay | Admitting: Internal Medicine

## 2015-07-10 VITALS — BP 140/70 | HR 76 | Temp 98.2°F | Resp 12 | Ht 63.5 in | Wt 129.2 lb

## 2015-07-10 DIAGNOSIS — G4700 Insomnia, unspecified: Secondary | ICD-10-CM

## 2015-07-10 DIAGNOSIS — N760 Acute vaginitis: Secondary | ICD-10-CM

## 2015-07-10 DIAGNOSIS — R300 Dysuria: Secondary | ICD-10-CM

## 2015-07-10 DIAGNOSIS — I6523 Occlusion and stenosis of bilateral carotid arteries: Secondary | ICD-10-CM

## 2015-07-10 DIAGNOSIS — H6121 Impacted cerumen, right ear: Secondary | ICD-10-CM

## 2015-07-10 DIAGNOSIS — F5105 Insomnia due to other mental disorder: Secondary | ICD-10-CM

## 2015-07-10 DIAGNOSIS — F419 Anxiety disorder, unspecified: Secondary | ICD-10-CM | POA: Diagnosis not present

## 2015-07-10 LAB — POCT URINALYSIS DIPSTICK
Bilirubin, UA: NEGATIVE
Glucose, UA: NEGATIVE
NITRITE UA: NEGATIVE
Protein, UA: 100
RBC UA: NEGATIVE
SPEC GRAV UA: 1.025
UROBILINOGEN UA: 0.2
pH, UA: 5.5

## 2015-07-10 MED ORDER — TEMAZEPAM 15 MG PO CAPS: 15.0000 mg | ORAL_CAPSULE | Freq: Every evening | ORAL | Status: DC | PRN

## 2015-07-10 MED ORDER — TIOCONAZOLE 6.5 % VA OINT
1.0000 | TOPICAL_OINTMENT | Freq: Once | VAGINAL | Status: DC
Start: 2015-07-10 — End: 2016-12-08

## 2015-07-10 MED ORDER — NITROGLYCERIN 0.4 MG SL SUBL: 0.4000 mg | SUBLINGUAL_TABLET | SUBLINGUAL | Status: DC | PRN

## 2015-07-10 NOTE — Progress Notes (Addendum)
Subjective:  Patient ID: [name], female    DOB: 1925-10-26  Age: 81 y.o. MRN: [id number]  CC: The primary encounter diagnosis was Dysuria. Diagnoses of Vaginitis and vulvovaginitis, Insomnia secondary to anxiety, Insomnia, persistent, and Cerumen impaction, right were also pertinent to this visit.  [name] presents for evaluation of vaginal odor.  She reports the recent development of scant drainage over the last several days  She denies abdominal pain ,  Back pain , urinary frequency and nausea. .  Not sleeping well despite using 0.5 mg of alprazolam.  She is averaging less than 4 hours per night.   Right cerumen  impaction    Outpatient Prescriptions Prior to Visit  Medication Sig Dispense Refill  . ALPRAZolam (XANAX) 0.25 MG tablet 1 to 2 tablets at bedtime as needed for insomnia 60 tablet 3  . amLODipine (NORVASC) 2.5 MG tablet Take 1 tablet (2.5 mg total) by mouth daily. 90 tablet 1  . aspirin 81 MG tablet Take 81 mg by mouth daily.      . calcium-vitamin D (SM CALCIUM 500/VITAMIN D3) 500-400 MG-UNIT per tablet Take 1 tablet by mouth daily.      . clopidogrel (PLAVIX) 75 MG tablet Take 1 tablet (75 mg total) by mouth daily. 90 tablet 1  . esomeprazole (NEXIUM) 40 MG capsule TAKE 1 CAPSULE DAILY BEFORE BREAKFAST 90 capsule 1  . isosorbide mononitrate (IMDUR) 30 MG 24 hr tablet Take 30 mg by mouth as needed.     Marland Kitchen losartan (COZAAR) 100 MG tablet Take 1 tablet (100 mg total) by mouth daily. 90 tablet 1  . multivitamin (THERAGRAN) per tablet Take 1 tablet by mouth daily.      . Psyllium-Calcium (METAMUCIL PLUS CALCIUM) CAPS Take 2 capsules by mouth daily as needed.     . simvastatin (ZOCOR) 40 MG tablet TAKE ONE TABLET AT BEDTIME 90 tablet 3  . tiotropium (SPIRIVA HANDIHALER) 18 MCG inhalation capsule Place 1 capsule (18 mcg total) into inhaler and inhale daily. 30 capsule 2  . nitroGLYCERIN (NITROSTAT) 0.4 MG SL tablet Place 1 tablet (0.4 mg total) under the tongue  every 5 (five) minutes as needed for chest pain. 25 tablet 6  . hyoscyamine (LEVSIN SL) 0.125 MG SL tablet Place 1 tablet (0.125 mg total) under the tongue every 4 (four) hours as needed. 30 tablet 0   No facility-administered medications prior to visit.    Review of Systems;  Patient denies headache, fevers, malaise, unintentional weight loss, skin rash, eye pain, sinus congestion and sinus pain, sore throat, dysphagia,  hemoptysis , cough, dyspnea, wheezing, chest pain, palpitations, orthopnea, edema, abdominal pain, nausea, melena, diarrhea, constipation, flank pain, dysuria, hematuria, urinary  Frequency, nocturia, numbness, tingling, seizures,  Focal weakness, Loss of consciousness,  Tremor, insomnia, depression, anxiety, and suicidal ideation.      Objective:  BP 140/70 mmHg  Pulse 76  Temp(Src) 98.2 F (36.8 C) (Oral)  Resp 12  Ht 5' 3.5" (1.613 m)  Wt 129 lb 4 oz (58.627 kg)  BMI 22.53 kg/m2  SpO2 98%  BP Readings from Last 3 Encounters:  07/10/15 140/70  05/04/15 136/72  04/13/15 120/62    Wt Readings from Last 3 Encounters:  07/10/15 129 lb 4 oz (58.627 kg)  05/04/15 130 lb (58.968 kg)  04/13/15 131 lb (59.421 kg)   General Appearance:    Alert, cooperative, no distress, appears stated age  Head:    Normocephalic, without obvious abnormality, atraumatic  Eyes:  PERRL, conjunctiva/corneas clear, EOM's intact, fundi    benign, both eyes  Ears:    Right ear canal impacted with cerumen .  Left TM normal   Nose:   Nares normal, septum midline, mucosa normal, no drainage    or sinus tenderness  Throat:   Lips, mucosa, and tongue normal; teeth and gums normal  Neck:   Supple, symmetrical, trachea midline, no adenopathy;    thyroid:  no enlargement/tenderness/nodules; no carotid   bruit or JVD  Back:     Symmetric, no curvature, ROM normal, no CVA tenderness  Lungs:     Clear to auscultation bilaterally, respirations unlabored  Chest Wall:    No tenderness or  deformity   Heart:    Regular rate and rhythm, S1 and S2 normal, no murmur, rub   or gallop     Abdomen:     Soft, non-tender, bowel sounds active all four quadrants,    no masses, no organomegaly  Genitalia:    Pelvic: cervix surgically absent  external genitalia atrophic, labial folds mildly erythematous, no adnexal masses or tenderness, , rectovaginal septum normal, uterus surgically absent , vagina normal without discharge  Extremities:   Extremities normal, atraumatic, no cyanosis or edema  Pulses:   2+ and symmetric all extremities  Skin:   Skin color, texture, turgor normal, no rashes or lesions  Lymph nodes:   Cervical, supraclavicular, and axillary nodes normal  Neurologic:   CNII-XII intact, normal strength, sensation and reflexes    throughout     No results found for: HGBA1C  Lab Results  Component Value Date   CREATININE 1.19 02/09/2015   CREATININE 1.38* 06/18/2014   CREATININE 1.4* 06/18/2014    Lab Results  Component Value Date   WBC 6.4 02/09/2015   HGB 13.7 02/09/2015   HCT 40.8 02/09/2015   PLT 184.0 02/09/2015   GLUCOSE 105* 02/09/2015   CHOL 142 02/09/2015   TRIG 136.0 02/09/2015   HDL 48.40 02/09/2015   LDLDIRECT 83.6 02/07/2013   LDLCALC 66 02/09/2015   ALT 13 02/09/2015   AST 16 02/09/2015   NA 141 02/09/2015   K 4.7 02/09/2015   CL 106 02/09/2015   CREATININE 1.19 02/09/2015   BUN 18 02/09/2015   CO2 30 02/09/2015   TSH 1.44 02/09/2015   INR 1.0 06/17/2014   MICROALBUR 2.5* 12/26/2011    No results found.  Assessment & Plan:   Problem List Items Addressed This Visit    Insomnia, persistent    Not sleeping on 0.5 mg alprazolam.  .  Trial of  restoril starting with 15 mg at night.         Relevant Medications   temazepam (RESTORIL) 15 MG capsule   Vaginitis and vulvovaginitis    Pelvic exam was normal except for mil erythem of vaginal folds and labia. Marland Kitchen  UA was positive only for leukocytosis. Will treat vaginal folds with  miconazole.       Relevant Medications   tioconazole (VAGISTAT) 6.5 % vaginal ointment   Insomnia secondary to anxiety   Cerumen impaction    Advised to try Debrox drops,  Return of irrigation if needed.        Other Visit Diagnoses    Dysuria    -  Primary    Relevant Orders    POCT Urinalysis Dipstick (Completed)    Urine Culture (Completed)    Urinalysis, Routine w reflex microscopic (Completed)    Urine Microscopic (Completed)  I am having Ms. Grade start on tioconazole and temazepam. I am also having her maintain her aspirin, calcium-vitamin D, METAMUCIL PLUS CALCIUM, multivitamin, esomeprazole, isosorbide mononitrate, hyoscyamine, simvastatin, amLODipine, clopidogrel, losartan, ALPRAZolam, tiotropium, and nitroGLYCERIN.  Meds ordered this encounter  Medications  . tioconazole (VAGISTAT) 6.5 % vaginal ointment    Sig: Place 1 applicator vaginally once.    Dispense:  8 g    Refill:  0  . temazepam (RESTORIL) 15 MG capsule    Sig: Take 1 capsule (15 mg total) by mouth at bedtime as needed for sleep (insomnia, may repeat in one hour).    Dispense:  60 capsule    Refill:  2  . nitroGLYCERIN (NITROSTAT) 0.4 MG SL tablet    Sig: Place 1 tablet (0.4 mg total) under the tongue every 5 (five) minutes as needed for chest pain.    Dispense:  25 tablet    Refill:  6    Medications Discontinued During This Encounter  Medication Reason  . nitroGLYCERIN (NITROSTAT) 0.4 MG SL tablet Reorder    Follow-up: No Follow-up on file.   [name], MD

## 2015-07-10 NOTE — Patient Instructions (Addendum)
Your vaginal area is slightly red,  So you may have a mild yeast infection    I have called in an antifungal ointment that you can use if your vaginal odor or discharge returns.  You do not need to insert  the applicator.  You can just take the ointment and apply gently to the outer folds of your vagina with your finger   I have prescribed temazepam for your insomnia.  Take one tablet one hour before bedtime.  You can take another 1/2 tablet if you are still wide awake in an hour  The maximum dose is 30 mg   DO NOT COMBINE WITH ALCOHOL OR ALPRAZOLAM

## 2015-07-11 LAB — URINALYSIS, ROUTINE W REFLEX MICROSCOPIC
BILIRUBIN URINE: NEGATIVE
GLUCOSE, UA: NEGATIVE
HGB URINE DIPSTICK: NEGATIVE
Nitrite: NEGATIVE
PH: 5.5 (ref 5.0–8.0)
SPECIFIC GRAVITY, URINE: 1.028 (ref 1.001–1.035)

## 2015-07-11 LAB — URINALYSIS, MICROSCOPIC ONLY
BACTERIA UA: NONE SEEN [HPF]
Casts: NONE SEEN [LPF]
Crystals: NONE SEEN [HPF]
Yeast: NONE SEEN [HPF]

## 2015-07-12 DIAGNOSIS — N760 Acute vaginitis: Secondary | ICD-10-CM | POA: Insufficient documentation

## 2015-07-12 DIAGNOSIS — F419 Anxiety disorder, unspecified: Secondary | ICD-10-CM | POA: Insufficient documentation

## 2015-07-12 DIAGNOSIS — F5105 Insomnia due to other mental disorder: Secondary | ICD-10-CM | POA: Insufficient documentation

## 2015-07-12 DIAGNOSIS — H6120 Impacted cerumen, unspecified ear: Secondary | ICD-10-CM | POA: Insufficient documentation

## 2015-07-12 LAB — URINE CULTURE
Colony Count: NO GROWTH
ORGANISM ID, BACTERIA: NO GROWTH

## 2015-07-12 NOTE — Assessment & Plan Note (Signed)
Advised to try Debrox drops,  Return of irrigation if needed.

## 2015-07-12 NOTE — Assessment & Plan Note (Signed)
Not sleeping on 0.5 mg alprazolam.  .  Trial of  restoril starting with 15 mg at night.

## 2015-07-12 NOTE — Assessment & Plan Note (Addendum)
Pelvic exam was normal except for mil erythem of vaginal folds and labia. Marland Kitchen  UA was positive only for leukocytosis. Will treat vaginal folds with miconazole.

## 2015-07-12 NOTE — Addendum Note (Signed)
Addended by: [name] on: 07/12/2015 06:03 PM   Modules accepted: Miquel Dunn

## 2015-07-22 DIAGNOSIS — C44729 Squamous cell carcinoma of skin of left lower limb, including hip: Secondary | ICD-10-CM | POA: Diagnosis not present

## 2015-07-22 DIAGNOSIS — D0461 Carcinoma in situ of skin of right upper limb, including shoulder: Secondary | ICD-10-CM | POA: Diagnosis not present

## 2015-07-22 DIAGNOSIS — D485 Neoplasm of uncertain behavior of skin: Secondary | ICD-10-CM | POA: Diagnosis not present

## 2015-07-29 DIAGNOSIS — L570 Actinic keratosis: Secondary | ICD-10-CM | POA: Diagnosis not present

## 2015-07-29 DIAGNOSIS — D0461 Carcinoma in situ of skin of right upper limb, including shoulder: Secondary | ICD-10-CM | POA: Diagnosis not present

## 2015-08-17 ENCOUNTER — Other Ambulatory Visit: Payer: Self-pay | Admitting: Internal Medicine

## 2015-08-24 DIAGNOSIS — L570 Actinic keratosis: Secondary | ICD-10-CM | POA: Diagnosis not present

## 2015-08-24 DIAGNOSIS — X32XXXA Exposure to sunlight, initial encounter: Secondary | ICD-10-CM | POA: Diagnosis not present

## 2015-09-03 ENCOUNTER — Other Ambulatory Visit: Payer: Self-pay | Admitting: *Deleted

## 2015-09-03 DIAGNOSIS — Z1231 Encounter for screening mammogram for malignant neoplasm of breast: Secondary | ICD-10-CM

## 2015-10-10 ENCOUNTER — Other Ambulatory Visit: Payer: Self-pay | Admitting: Cardiovascular Disease

## 2015-10-12 ENCOUNTER — Encounter: Payer: Self-pay | Admitting: Cardiovascular Disease

## 2015-10-12 ENCOUNTER — Ambulatory Visit (INDEPENDENT_AMBULATORY_CARE_PROVIDER_SITE_OTHER): Payer: Medicare Other | Admitting: Cardiovascular Disease

## 2015-10-12 VITALS — BP 130/60 | HR 58 | Ht 63.0 in | Wt 135.8 lb

## 2015-10-12 DIAGNOSIS — I10 Essential (primary) hypertension: Secondary | ICD-10-CM | POA: Diagnosis not present

## 2015-10-12 DIAGNOSIS — R001 Bradycardia, unspecified: Secondary | ICD-10-CM | POA: Diagnosis not present

## 2015-10-12 DIAGNOSIS — R079 Chest pain, unspecified: Secondary | ICD-10-CM

## 2015-10-12 DIAGNOSIS — I700 Atherosclerosis of aorta: Secondary | ICD-10-CM

## 2015-10-12 DIAGNOSIS — I779 Disorder of arteries and arterioles, unspecified: Secondary | ICD-10-CM

## 2015-10-12 DIAGNOSIS — E785 Hyperlipidemia, unspecified: Secondary | ICD-10-CM

## 2015-10-12 DIAGNOSIS — I739 Peripheral vascular disease, unspecified: Secondary | ICD-10-CM

## 2015-10-12 DIAGNOSIS — Z951 Presence of aortocoronary bypass graft: Secondary | ICD-10-CM

## 2015-10-12 NOTE — Assessment & Plan Note (Signed)
Aggressively treated for atherosclerosis, cholesterol at goal

## 2015-10-12 NOTE — Assessment & Plan Note (Signed)
Cholesterol is at goal on the current lipid regimen. No changes to the medications were made.  

## 2015-10-12 NOTE — Assessment & Plan Note (Signed)
Blood pressure is well controlled on today's visit. No changes made to the medications. 

## 2015-10-12 NOTE — Assessment & Plan Note (Signed)
Plan as detailed above. Long discussion about various treatment options for her left side chest pain. Occurring at rest, not with stress that suggested we take this seriously. Unclear if this was getting worse, possibly mildly more uncomfortable but still atypical in nature. Recommended she call us for the stress test when she is ready  More than 25 minutes spent with her in clinic today Greater than 50% was spent in counseling and coordination of care with the patient

## 2015-10-12 NOTE — Assessment & Plan Note (Signed)
40-59% bilateral carotid arterial disease Repeat in 2018

## 2015-10-12 NOTE — Progress Notes (Signed)
Patient ID: DEMYIAH HALLISEY, female    DOB: 27-Mar-1926, 82 y.o.   MRN: [id number]  HPI Comments: Ms. Froberg is a pleasant 82 year old woman with a history of CAD, bypass surgery in 1999, long history of smoking for 40 years who quit in 1985, COPD, chronic renal insufficiency, hypertension, hyperlipidemia, 40-50% bilateral carotid arterial disease who presents for routine followup of her coronary artery disease Last stress test September 2015  In follow-up today, she reports that she continues to have her chronic left chest/left shoulder chest pain She did discuss this on her last clinic visit and declined stress testing or catheterization  at that time. Symptoms are perhaps a little bit worse. She feels these after she climbs her stairs code abetted night, when she is in the bed she feels her left shoulder discomfort. Unclear if there is discomfort with palpation. Denies having significant symptoms on exertion doing her ADLs such as shopping. Reports that she might have some very light discomfort in the clinic exam room today at rest sitting on the table. She still plays golf, uses the treadmill  Last Saturday had a discomfort that bothered her, took aspirin and nitroglycerin, after several hours symptoms resolved.   EKG on today's visit shows normal sinus rhythm with rate 58 bpm, nonspecific T wave abnormality to the anterior precordial leads, unchanged from prior EKG  Other past medical history reviewed She has not had any intervention since her bypass surgery. No cardiac catheterizations, no stent placement.  C. difficile in January 2015. She was in the hospital for several days.  Last stress test was October 2011 and September 2015  Carotid ultrasound showing 40-59% disease  Prior lab work showing total cholesterol 140, LDL 62   Allergies  Allergen Reactions  . Codeine Nausea And Vomiting    Outpatient Encounter Prescriptions as of 10/12/2015  Medication Sig  . ALPRAZolam  (XANAX) 0.25 MG tablet 1 to 2 tablets at bedtime as needed for insomnia  . amLODipine (NORVASC) 2.5 MG tablet TAKE 1 TABLET EVERY DAY  . aspirin 81 MG tablet Take 81 mg by mouth daily.    . calcium-vitamin D (SM CALCIUM 500/VITAMIN D3) 500-400 MG-UNIT per tablet Take 1 tablet by mouth daily.    . clopidogrel (PLAVIX) 75 MG tablet Take 1 tablet (75 mg total) by mouth daily.  Marland Kitchen esomeprazole (NEXIUM) 40 MG capsule TAKE 1 CAPSULE DAILY BEFORE BREAKFAST  . hyoscyamine (LEVSIN SL) 0.125 MG SL tablet Place 1 tablet (0.125 mg total) under the tongue every 4 (four) hours as needed.  . isosorbide mononitrate (IMDUR) 30 MG 24 hr tablet Take 30 mg by mouth as needed.   Marland Kitchen losartan (COZAAR) 100 MG tablet Take 1 tablet (100 mg total) by mouth daily.  . multivitamin (THERAGRAN) per tablet Take 1 tablet by mouth daily.    . nitroGLYCERIN (NITROSTAT) 0.4 MG SL tablet Place 1 tablet (0.4 mg total) under the tongue every 5 (five) minutes as needed for chest pain.  Marland Kitchen Psyllium-Calcium (METAMUCIL PLUS CALCIUM) CAPS Take 2 capsules by mouth daily as needed.   . simvastatin (ZOCOR) 40 MG tablet TAKE ONE TABLET AT BEDTIME  . temazepam (RESTORIL) 15 MG capsule Take 1 capsule (15 mg total) by mouth at bedtime as needed for sleep (insomnia, may repeat in one hour).  Marland Kitchen tioconazole (VAGISTAT) 6.5 % vaginal ointment Place 1 applicator vaginally once.  . tiotropium (SPIRIVA HANDIHALER) 18 MCG inhalation capsule Place 1 capsule (18 mcg total) into inhaler and inhale daily.  No facility-administered encounter medications on file as of 10/12/2015.    Past Medical History  Diagnosis Date  . Carotid artery disease ([place])     Doppler January, 2012, 40-59% bilateral, followup one year  . Hyperlipidemia   . Hypertension   . SOB (shortness of breath)   . Dizziness     stable now (Oct 2011) patient tells me question of TIA, we will obtain records  . Syncopal episodes     after 18 holes of golf and increase hydrochlorothiazide,  resolved   . Indigestion     3 weeks, October 2011  . CAD (coronary artery disease)     a. Nuclear 10/11, not gated, no scar or ischemia, EF 68%, low risk study; b. [place] 06/18/14: no evidence of infarction or ischemia, no WMA, normalization of TWI in precordial leads, equivocal EKG changes EF 77%, low risk study  . Rheumatic fever   . History of migraines   . Kidney stones   . GERD (gastroesophageal reflux disease)   . Diverticulitis     last colonoscopy  incomplete Jan 2011  . Hx of CABG     a. 3 vessel in 1999  . Gait abnormality     Patient complains of "wobbly gait", December, 2013  . Sinus bradycardia     Mild, December, 2013  . Diffuse cystic mastopathy   . C. difficile colitis     Past Surgical History  Procedure Laterality Date  . Breast biopsy  1970  . Esophagus stretched    . Appendectomy  1950  . Coronary artery bypass graft  1999  . Tonsillectomy  1939  . Total abdominal hysterectomy  1968    due to metrorrhagia  . Cataract extraction Right 2009  . Cataract extraction Left 2011  . Colonoscopy  [id number]    Dr. Jamal Collin    Social History  reports that she quit smoking about 32 years ago. Her smoking use included Cigarettes. She has a 20 pack-year smoking history. She has never used smokeless tobacco. She reports that she drinks alcohol. She reports that she does not use illicit drugs.  Family History family history includes Heart disease in her father and mother. There is no history of Cancer or Drug abuse.   Review of Systems  Constitutional: Negative.   Respiratory: Negative.   Cardiovascular: Positive for chest pain.       Left shoulder pain  Gastrointestinal: Negative.   Musculoskeletal: Negative.   Neurological: Negative.   Hematological: Negative.   Psychiatric/Behavioral: Negative.   All other systems reviewed and are negative.   BP 130/60 mmHg  Pulse 58  Ht 5\' 3"  (1.6 m)  Wt 135 lb 12 oz (61.576 kg)  BMI 24.05 kg/m2  Physical  Exam  Constitutional: She is oriented to person, place, and time. She appears well-developed and well-nourished.  HENT:  Head: Normocephalic.  Nose: Nose normal.  Mouth/Throat: Oropharynx is clear and moist.  Eyes: Conjunctivae are normal. Pupils are equal, round, and reactive to light.  Neck: Normal range of motion. Neck supple. No JVD present.  Cardiovascular: Normal rate, regular rhythm, S1 normal, S2 normal, normal heart sounds and intact distal pulses.  Exam reveals no gallop and no friction rub.   No murmur heard. Pulmonary/Chest: Effort normal and breath sounds normal. No respiratory distress. She has no wheezes. She has no rales. She exhibits no tenderness.  Abdominal: Soft. Bowel sounds are normal. She exhibits no distension. There is no tenderness.  Musculoskeletal: Normal range of motion.  She exhibits no edema or tenderness.  Lymphadenopathy:    She has no cervical adenopathy.  Neurological: She is alert and oriented to person, place, and time. Coordination normal.  Skin: Skin is warm and dry. No rash noted. No erythema.  Psychiatric: She has a normal mood and affect. Her behavior is normal. Judgment and thought content normal.    Assessment and Plan  Nursing note and vitals reviewed.

## 2015-10-12 NOTE — Patient Instructions (Signed)
You are doing well. No medication changes were made.  Try some anti-inflammatory medication for the left chest  (aleve/naproxen)  If you continue to have discomfort in the left chest, We will schedule a stress test for chest pain, treadmill myoview  Please call us if you have new issues that need to be addressed before your next appt.  Your physician wants you to follow-up in: 6 months.  You will receive a reminder letter in the mail two months in advance. If you don't receive a letter, please call our office to schedule the follow-up appointment.

## 2015-10-12 NOTE — Assessment & Plan Note (Signed)
Long history of left side chest pain, shoulder pain at rest. Discussed this with her on her last clinic visit one year ago.  Again having symptoms today. Atypical in nature. We have offered stress testing and catheterization She was unclear about the stress test, in the and suggested we hold off on stress testing, she will try NSAIDs first and call us if this does not work for a stress test.

## 2015-10-20 DIAGNOSIS — X32XXXA Exposure to sunlight, initial encounter: Secondary | ICD-10-CM | POA: Diagnosis not present

## 2015-10-20 DIAGNOSIS — L570 Actinic keratosis: Secondary | ICD-10-CM | POA: Diagnosis not present

## 2015-10-22 DIAGNOSIS — X32XXXA Exposure to sunlight, initial encounter: Secondary | ICD-10-CM | POA: Diagnosis not present

## 2015-10-22 DIAGNOSIS — L821 Other seborrheic keratosis: Secondary | ICD-10-CM | POA: Diagnosis not present

## 2015-11-09 ENCOUNTER — Ambulatory Visit (INDEPENDENT_AMBULATORY_CARE_PROVIDER_SITE_OTHER): Payer: Medicare Other | Admitting: Internal Medicine

## 2015-11-09 ENCOUNTER — Encounter: Payer: Self-pay | Admitting: Internal Medicine

## 2015-11-09 VITALS — BP 140/78 | HR 59 | Temp 97.6°F | Resp 12 | Ht 63.0 in | Wt 135.5 lb

## 2015-11-09 DIAGNOSIS — E785 Hyperlipidemia, unspecified: Secondary | ICD-10-CM | POA: Diagnosis not present

## 2015-11-09 DIAGNOSIS — Z79899 Other long term (current) drug therapy: Secondary | ICD-10-CM

## 2015-11-09 DIAGNOSIS — Z0000 Encounter for general adult medical examination without abnormal findings: Secondary | ICD-10-CM

## 2015-11-09 DIAGNOSIS — N9489 Other specified conditions associated with female genital organs and menstrual cycle: Secondary | ICD-10-CM | POA: Diagnosis not present

## 2015-11-09 DIAGNOSIS — E559 Vitamin D deficiency, unspecified: Secondary | ICD-10-CM

## 2015-11-09 DIAGNOSIS — R300 Dysuria: Secondary | ICD-10-CM

## 2015-11-09 DIAGNOSIS — Z23 Encounter for immunization: Secondary | ICD-10-CM

## 2015-11-09 DIAGNOSIS — N898 Other specified noninflammatory disorders of vagina: Secondary | ICD-10-CM

## 2015-11-09 LAB — URINALYSIS, ROUTINE W REFLEX MICROSCOPIC
Bilirubin Urine: NEGATIVE
HGB URINE DIPSTICK: NEGATIVE
Ketones, ur: NEGATIVE
Nitrite: NEGATIVE
SPECIFIC GRAVITY, URINE: 1.015 (ref 1.000–1.030)
URINE GLUCOSE: NEGATIVE
Urobilinogen, UA: 0.2 (ref 0.0–1.0)
pH: 7 (ref 5.0–8.0)

## 2015-11-09 LAB — COMPREHENSIVE METABOLIC PANEL
ALK PHOS: 58 U/L (ref 39–117)
ALT: 15 U/L (ref 0–35)
AST: 21 U/L (ref 0–37)
Albumin: 4.1 g/dL (ref 3.5–5.2)
BUN: 20 mg/dL (ref 6–23)
CHLORIDE: 104 meq/L (ref 96–112)
CO2: 27 mEq/L (ref 19–32)
Calcium: 9.2 mg/dL (ref 8.4–10.5)
Creatinine, Ser: 1.18 mg/dL (ref 0.40–1.20)
GFR: 46.03 mL/min — AB (ref >60.00)
GLUCOSE: 109 mg/dL — AB (ref 70–99)
POTASSIUM: 4.5 meq/L (ref 3.5–5.1)
Sodium: 141 mEq/L (ref 135–145)
TOTAL PROTEIN: 6.7 g/dL (ref 6.0–8.3)
Total Bilirubin: 0.6 mg/dL (ref 0.2–1.2)

## 2015-11-09 LAB — LDL CHOLESTEROL, DIRECT: LDL DIRECT: 72 mg/dL

## 2015-11-09 NOTE — Progress Notes (Signed)
Patient ID: [name], female    DOB: 1925-09-26  Age: 82 y.o. MRN: [id number]  The patient is here for annual Medicare wellness examination and management of other chronic and acute problems.  DUE FOR SECOND PNEUMOVAX VACCINE, HAD THE FLU VACCINE  NEEDS DEXA SCAN COLONSCOPY  IN  2011 Using xanax once a night for insomia.  Still having trouble sleeping  Gets emotional with movies, TV shows, and random acts of kindness .  "I am not depressed"        The risk factors are reflected in the social history.  The roster of all physicians providing medical care to patient - is listed in the Snapshot section of the chart.  Activities of daily living:  The patient is 100% independent in all ADLs: dressing, toileting, feeding as well as independent mobility  Home safety : The patient has smoke detectors in the home. They wear seatbelts.  There are no firearms at home. There is no violence in the home.   There is no risks for hepatitis, STDs or HIV. There is no   history of blood transfusion. They have no travel history to infectious disease endemic areas of the world.  The patient has seen their dentist in the last six month. They have seen their eye doctor in the last year. They admit to slight hearing difficulty with regard to whispered voices and some television programs.  They have deferred audiologic testing in the last year.  They do not  have excessive sun exposure. Discussed the need for sun protection: hats, long sleeves and use of sunscreen if there is significant sun exposure.   Diet: the importance of a healthy diet is discussed. They do have a healthy diet.  The benefits of regular aerobic exercise were discussed. She walks 4 times per week ,  20 minutes.  Plays gold 2/week,  Does home exerciee 3/week .  Climbs stairs several times daily  Depression screen: there are no signs or vegative symptoms of depression- irritability, change in appetite, anhedonia,  sadness/tearfullness.  Cognitive assessment: the patient manages all their financial and personal affairs and is actively engaged. They could relate day,date,year and events; recalled 2/3 objects at 3 minutes; performed clock-face test normally.  The following portions of the patient's history were reviewed and updated as appropriate: allergies, current medications, past family history, past medical history,  past surgical history, past social history  and problem list.  Visual acuity was not assessed per patient preference since she has regular follow up with her ophthalmologist. Hearing and body mass index were assessed and reviewed.   During the course of the visit the patient was educated and counseled about appropriate screening and preventive services including : fall prevention , diabetes screening, nutrition counseling, colorectal cancer screening, and recommended immunizations.    CC: The primary encounter diagnosis was Vaginal odor. Diagnoses of Hyperlipidemia, Vitamin D deficiency, Long-term use of high-risk medication, Dysuria, Need for prophylactic vaccination against Streptococcus pneumoniae (pneumococcus), and Medicare annual wellness visit, subsequent were also pertinent to this visit.   Cc: "I notice a strong odor" without vaginal discharge.  Wears a pad due ot occaional stress incontinence. Denies dysuria and frequency. Histoyr of c dificile colitis.  Worried about future use of abx,  Takes a probioitc daily  History Jordy has a past medical history of Carotid artery disease ([place]); Hyperlipidemia; Hypertension; SOB (shortness of breath); Dizziness; Syncopal episodes; Indigestion; CAD (coronary artery disease); Rheumatic fever; History of migraines; Kidney stones; GERD (gastroesophageal reflux  disease); Diverticulitis; CABG; Gait abnormality; Sinus bradycardia; Diffuse cystic mastopathy; and C. difficile colitis.   She has past surgical history that includes Breast biopsy (1970);  esophagus stretched; Appendectomy (1950); Coronary artery bypass graft (1999); Tonsillectomy (1939); Total abdominal hysterectomy (1968); Cataract extraction (Right, 2009); Cataract extraction (Left, 2011); and Colonoscopy [id number]).   Her family history includes Heart disease in her father and mother. There is no history of Cancer or Drug abuse.She reports that she quit smoking about 32 years ago. Her smoking use included Cigarettes. She has a 20 pack-year smoking history. She has never used smokeless tobacco. She reports that she drinks alcohol. She reports that she does not use illicit drugs.  Outpatient Prescriptions Prior to Visit  Medication Sig Dispense Refill  . ALPRAZolam (XANAX) 0.25 MG tablet 1 to 2 tablets at bedtime as needed for insomnia 60 tablet 3  . amLODipine (NORVASC) 2.5 MG tablet TAKE 1 TABLET EVERY DAY 90 tablet 2  . aspirin 81 MG tablet Take 81 mg by mouth daily.      . calcium-vitamin D (SM CALCIUM 500/VITAMIN D3) 500-400 MG-UNIT per tablet Take 1 tablet by mouth daily.      . clopidogrel (PLAVIX) 75 MG tablet Take 1 tablet (75 mg total) by mouth daily. 90 tablet 1  . esomeprazole (NEXIUM) 40 MG capsule TAKE 1 CAPSULE DAILY BEFORE BREAKFAST 90 capsule 1  . hyoscyamine (LEVSIN SL) 0.125 MG SL tablet Place 1 tablet (0.125 mg total) under the tongue every 4 (four) hours as needed. 30 tablet 0  . isosorbide mononitrate (IMDUR) 30 MG 24 hr tablet Take 30 mg by mouth as needed.     Marland Kitchen losartan (COZAAR) 100 MG tablet Take 1 tablet (100 mg total) by mouth daily. 90 tablet 1  . multivitamin (THERAGRAN) per tablet Take 1 tablet by mouth daily.      . nitroGLYCERIN (NITROSTAT) 0.4 MG SL tablet Place 1 tablet (0.4 mg total) under the tongue every 5 (five) minutes as needed for chest pain. 25 tablet 6  . Psyllium-Calcium (METAMUCIL PLUS CALCIUM) CAPS Take 2 capsules by mouth daily as needed.     . simvastatin (ZOCOR) 40 MG tablet TAKE ONE TABLET AT BEDTIME 90 tablet 3  . temazepam  (RESTORIL) 15 MG capsule Take 1 capsule (15 mg total) by mouth at bedtime as needed for sleep (insomnia, may repeat in one hour). 60 capsule 2  . tioconazole (VAGISTAT) 6.5 % vaginal ointment Place 1 applicator vaginally once. 8 g 0  . tiotropium (SPIRIVA HANDIHALER) 18 MCG inhalation capsule Place 1 capsule (18 mcg total) into inhaler and inhale daily. 30 capsule 2   No facility-administered medications prior to visit.    Review of Systems   Patient denies headache, fevers, malaise, unintentional weight loss, skin rash, eye pain, sinus congestion and sinus pain, sore throat, dysphagia,  hemoptysis , cough, dyspnea, wheezing, chest pain, palpitations, orthopnea, edema, abdominal pain, nausea, melena, diarrhea, constipation, flank pain, dysuria, hematuria, urinary  Frequency, nocturia, numbness, tingling, seizures,  Focal weakness, Loss of consciousness,  Tremor, insomnia, depression, anxiety, and suicidal ideation.      Objective:  BP 140/78 mmHg  Pulse 59  Temp(Src) 97.6 F (36.4 C) (Oral)  Resp 12  Ht 5\' 3"  (1.6 m)  Wt 135 lb 8 oz (61.462 kg)  BMI 24.01 kg/m2  SpO2 97%  Physical Exam  General appearance: alert, cooperative and appears stated age Head: Normocephalic, without obvious abnormality, atraumatic Eyes: conjunctivae/corneas clear. PERRL, EOM's intact. Fundi benign. Ears: normal  TM's and external ear canals both ears Nose: Nares normal. Septum midline. Mucosa normal. No drainage or sinus tenderness. Throat: lips, mucosa, and tongue normal; teeth and gums normal Neck: no adenopathy, no carotid bruit, no JVD, supple, symmetrical, trachea midline and thyroid not enlarged, symmetric, no tenderness/mass/nodules Lungs: clear to auscultation bilaterally Breasts: normal appearance, no masses or tenderness Heart: regular rate and rhythm, S1, S2 normal, no murmur, click, rub or gallop Abdomen: soft, non-tender; bowel sounds normal; no masses,  no organomegaly Extremities:  extremities normal, atraumatic, no cyanosis or edema Pulses: 2+ and symmetric Skin: Skin color, texture, turgor normal. No rashes or lesions Neurologic: Alert and oriented X 3, normal strength and tone. Normal symmetric reflexes. Normal coordination and gait.   Assessment & Plan:   Problem List Items Addressed This Visit    Medicare annual wellness visit, subsequent    Annual Medicare wellness  exam was done as well as a comprehensive physical exam and management of acute and chronic conditions .  During the course of the visit the patient was educated and counseled about appropriate screening and preventive services including : fall prevention , diabetes screening, nutrition counseling, colorectal cancer screening, and recommended immunizations.  Printed recommendations for health maintenance screenings was given.       Vaginal odor - Primary    Reassurance provided,  UA and culture pending . Without dysuira or frequency I will not treat given history of  dificile colitis      Relevant Orders   POCT urinalysis dipstick   Hyperlipidemia   Relevant Orders   Direct LDL    Other Visit Diagnoses    Vitamin D deficiency        Relevant Orders    VITAMIN D 25 Hydroxy (Vit-D Deficiency, Fractures)    Long-term use of high-risk medication        Relevant Orders    Comprehensive metabolic panel    Dysuria        Relevant Orders    Urinalysis, Routine w reflex microscopic (Completed)    Urine culture    Need for prophylactic vaccination against Streptococcus pneumoniae (pneumococcus)        Relevant Orders    Pneumococcal polysaccharide vaccine 23-valent greater than or equal to 2yo subcutaneous/IM (Completed)       I am having Ms. Chock maintain her aspirin, calcium-vitamin D, METAMUCIL PLUS CALCIUM, multivitamin, esomeprazole, isosorbide mononitrate, hyoscyamine, clopidogrel, losartan, ALPRAZolam, tiotropium, tioconazole, temazepam, nitroGLYCERIN, amLODipine, and simvastatin.  No  orders of the defined types were placed in this encounter.    There are no discontinued medications.  Follow-up: No Follow-up on file.   [name], MD

## 2015-11-09 NOTE — Progress Notes (Signed)
Pre-visit discussion using our clinic review tool. No additional management support is needed unless otherwise documented below in the visit note.  

## 2015-11-09 NOTE — Patient Instructions (Signed)
You can increase your bedtime dose of alprazolam to 0.5 mg .  Let me knwow if that helps  Bone Density test to be ordered soon   Menopause is a normal process in which your reproductive ability comes to an end. This process happens gradually over a span of months to years, usually between the ages of 24 and 80. Menopause is complete when you have missed 12 consecutive menstrual periods. It is important to talk with your health care provider about some of the most common conditions that affect postmenopausal women, such as heart disease, cancer, and bone loss (osteoporosis). Adopting a healthy lifestyle and getting preventive care can help to promote your health and wellness. Those actions can also lower your chances of developing some of these common conditions. WHAT SHOULD I KNOW ABOUT MENOPAUSE? During menopause, you may experience a number of symptoms, such as:  Moderate-to-severe hot flashes.  Night sweats.  Decrease in sex drive.  Mood swings.  Headaches.  Tiredness.  Irritability.  Memory problems.  Insomnia. Choosing to treat or not to treat menopausal changes is an individual decision that you make with your health care provider. WHAT SHOULD I KNOW ABOUT HORMONE REPLACEMENT THERAPY AND SUPPLEMENTS? Hormone therapy products are effective for treating symptoms that are associated with menopause, such as hot flashes and night sweats. Hormone replacement carries certain risks, especially as you become older. If you are thinking about using estrogen or estrogen with progestin treatments, discuss the benefits and risks with your health care provider. WHAT SHOULD I KNOW ABOUT HEART DISEASE AND STROKE? Heart disease, heart attack, and stroke become more likely as you age. This may be due, in part, to the hormonal changes that your body experiences during menopause. These can affect how your body processes dietary fats, triglycerides, and cholesterol. Heart attack and stroke are both  medical emergencies. There are many things that you can do to help prevent heart disease and stroke:  Have your blood pressure checked at least every 1-2 years. High blood pressure causes heart disease and increases the risk of stroke.  If you are 80-78 years old, ask your health care provider if you should take aspirin to prevent a heart attack or a stroke.  Do not use any tobacco products, including cigarettes, chewing tobacco, or electronic cigarettes. If you need help quitting, ask your health care provider.  It is important to eat a healthy diet and maintain a healthy weight.  Be sure to include plenty of vegetables, fruits, low-fat dairy products, and lean protein.  Avoid eating foods that are high in solid fats, added sugars, or salt (sodium).  Get regular exercise. This is one of the most important things that you can do for your health.  Try to exercise for at least 150 minutes each week. The type of exercise that you do should increase your heart rate and make you sweat. This is known as moderate-intensity exercise.  Try to do strengthening exercises at least twice each week. Do these in addition to the moderate-intensity exercise.  Know your numbers.Ask your health care provider to check your cholesterol and your blood glucose. Continue to have your blood tested as directed by your health care provider. WHAT SHOULD I KNOW ABOUT CANCER SCREENING? There are several types of cancer. Take the following steps to reduce your risk and to catch any cancer development as early as possible. Breast Cancer  Practice breast self-awareness.  This means understanding how your breasts normally appear and feel.  It also means  doing regular breast self-exams. Let your health care provider know about any changes, no matter how small.  If you are 80 or older, have a clinician do a breast exam (clinical breast exam or CBE) every year. Depending on your age, family history, and medical history,  it may be recommended that you also have a yearly breast X-ray (mammogram).  If you have a family history of breast cancer, talk with your health care provider about genetic screening.  If you are at high risk for breast cancer, talk with your health care provider about having an MRI and a mammogram every year.  Breast cancer (BRCA) gene test is recommended for women who have family members with BRCA-related cancers. Results of the assessment will determine the need for genetic counseling and BRCA1 and for BRCA2 testing. BRCA-related cancers include these types:  Breast. This occurs in males or females.  Ovarian.  Tubal. This may also be called fallopian tube cancer.  Cancer of the abdominal or pelvic lining (peritoneal cancer).  Prostate.  Pancreatic. Cervical, Uterine, and Ovarian Cancer Your health care provider may recommend that you be screened regularly for cancer of the pelvic organs. These include your ovaries, uterus, and vagina. This screening involves a pelvic exam, which includes checking for microscopic changes to the surface of your cervix (Pap test).  For women ages 80-65, health care providers may recommend a pelvic exam and a Pap test every three years. For women ages 80-65, they may recommend the Pap test and pelvic exam, combined with testing for human papilloma virus (HPV), every five years. Some types of HPV increase your risk of cervical cancer. Testing for HPV may also be done on women of any age who have unclear Pap test results.  Other health care providers may not recommend any screening for nonpregnant women who are considered low risk for pelvic cancer and have no symptoms. Ask your health care provider if a screening pelvic exam is right for you.  If you have had past treatment for cervical cancer or a condition that could lead to cancer, you need Pap tests and screening for cancer for at least 20 years after your treatment. If Pap tests have been discontinued  for you, your risk factors (such as having a new sexual partner) need to be reassessed to determine if you should start having screenings again. Some women have medical problems that increase the chance of getting cervical cancer. In these cases, your health care provider may recommend that you have screening and Pap tests more often.  If you have a family history of uterine cancer or ovarian cancer, talk with your health care provider about genetic screening.  If you have vaginal bleeding after reaching menopause, tell your health care provider.  There are currently no reliable tests available to screen for ovarian cancer. Lung Cancer Lung cancer screening is recommended for adults 59-38 years old who are at high risk for lung cancer because of a history of smoking. A yearly low-dose CT scan of the lungs is recommended if you:  Currently smoke.  Have a history of at least 30 pack-years of smoking and you currently smoke or have quit within the past 15 years. A pack-year is smoking an average of one pack of cigarettes per day for one year. Yearly screening should:  Continue until it has been 15 years since you quit.  Stop if you develop a health problem that would prevent you from having lung cancer treatment. Colorectal Cancer  This type  of cancer can be detected and can often be prevented.  Routine colorectal cancer screening usually begins at age 38 and continues through age 54.  If you have risk factors for colon cancer, your health care provider may recommend that you be screened at an earlier age.  If you have a family history of colorectal cancer, talk with your health care provider about genetic screening.  Your health care provider may also recommend using home test kits to check for hidden blood in your stool.  A small camera at the end of a tube can be used to examine your colon directly (sigmoidoscopy or colonoscopy). This is done to check for the earliest forms of  colorectal cancer.  Direct examination of the colon should be repeated every 5-10 years until age 2. However, if early forms of precancerous polyps or small growths are found or if you have a family history or genetic risk for colorectal cancer, you may need to be screened more often. Skin Cancer  Check your skin from head to toe regularly.  Monitor any moles. Be sure to tell your health care provider:  About any new moles or changes in moles, especially if there is a change in a mole's shape or color.  If you have a mole that is larger than the size of a pencil eraser.  If any of your family members has a history of skin cancer, especially at a young age, talk with your health care provider about genetic screening.  Always use sunscreen. Apply sunscreen liberally and repeatedly throughout the day.  Whenever you are outside, protect yourself by wearing long sleeves, pants, a wide-brimmed hat, and sunglasses. WHAT SHOULD I KNOW ABOUT OSTEOPOROSIS? Osteoporosis is a condition in which bone destruction happens more quickly than new bone creation. After menopause, you may be at an increased risk for osteoporosis. To help prevent osteoporosis or the bone fractures that can happen because of osteoporosis, the following is recommended:  If you are 39-38 years old, get at least 1,000 mg of calcium and at least 600 mg of vitamin D per day.  If you are older than age 28 but younger than age 53, get at least 1,200 mg of calcium and at least 600 mg of vitamin D per day.  If you are older than age 25, get at least 1,200 mg of calcium and at least 800 mg of vitamin D per day. Smoking and excessive alcohol intake increase the risk of osteoporosis. Eat foods that are rich in calcium and vitamin D, and do weight-bearing exercises several times each week as directed by your health care provider. WHAT SHOULD I KNOW ABOUT HOW MENOPAUSE AFFECTS [place]? Depression may occur at any age, but it is  more common as you become older. Common symptoms of depression include:  Low or sad mood.  Changes in sleep patterns.  Changes in appetite or eating patterns.  Feeling an overall lack of motivation or enjoyment of activities that you previously enjoyed.  Frequent crying spells. Talk with your health care provider if you think that you are experiencing depression. WHAT SHOULD I KNOW ABOUT IMMUNIZATIONS? It is important that you get and maintain your immunizations. These include:  Tetanus, diphtheria, and pertussis (Tdap) booster vaccine.  Influenza every year before the flu season begins.  Pneumonia vaccine.  Shingles vaccine. Your health care provider may also recommend other immunizations.   This information is not intended to replace advice given to you by your health care provider. Make  sure you discuss any questions you have with your health care provider.   Document Released: 10/28/2005 Document Revised: 09/26/2014 Document Reviewed: 05/08/2014 Elsevier Interactive Patient Education [employer].

## 2015-11-10 DIAGNOSIS — N898 Other specified noninflammatory disorders of vagina: Secondary | ICD-10-CM | POA: Insufficient documentation

## 2015-11-10 LAB — VITAMIN D 25 HYDROXY (VIT D DEFICIENCY, FRACTURES): VITD: 53.19 ng/mL (ref 30.00–100.00)

## 2015-11-10 NOTE — Assessment & Plan Note (Signed)
Reassurance provided,  UA and culture pending . Without dysuira or frequency I will not treat given history of  dificile colitis

## 2015-11-10 NOTE — Assessment & Plan Note (Signed)

## 2015-11-11 ENCOUNTER — Encounter: Payer: Self-pay | Admitting: Internal Medicine

## 2015-11-12 ENCOUNTER — Encounter: Payer: Self-pay | Admitting: Internal Medicine

## 2015-11-12 LAB — URINE CULTURE

## 2015-11-16 ENCOUNTER — Other Ambulatory Visit: Payer: Self-pay | Admitting: General Surgery

## 2015-11-16 ENCOUNTER — Ambulatory Visit
Admission: RE | Admit: 2015-11-16 | Discharge: 2015-11-16 | Disposition: A | Discharge status: Home / Self Care | Payer: Medicare Other | Source: Ambulatory Visit | Attending: General Surgery | Admitting: General Surgery

## 2015-11-16 DIAGNOSIS — Z1231 Encounter for screening mammogram for malignant neoplasm of breast: Secondary | ICD-10-CM | POA: Diagnosis not present

## 2015-11-23 ENCOUNTER — Other Ambulatory Visit: Payer: Self-pay | Admitting: Internal Medicine

## 2015-11-30 ENCOUNTER — Ambulatory Visit (INDEPENDENT_AMBULATORY_CARE_PROVIDER_SITE_OTHER): Payer: Medicare Other | Admitting: General Surgery

## 2015-11-30 ENCOUNTER — Encounter: Payer: Self-pay | Admitting: General Surgery

## 2015-11-30 ENCOUNTER — Ambulatory Visit: Payer: Medicare Other | Admitting: General Surgery

## 2015-11-30 VITALS — BP 128/74 | HR 68 | Resp 12 | Ht 63.0 in | Wt 135.0 lb

## 2015-11-30 DIAGNOSIS — N644 Mastodynia: Secondary | ICD-10-CM | POA: Diagnosis not present

## 2015-11-30 DIAGNOSIS — Z87898 Personal history of other specified conditions: Secondary | ICD-10-CM

## 2015-11-30 NOTE — Patient Instructions (Signed)
Patient will be asked to return to the office in one year with a bilateral screening mammogram. 

## 2015-11-30 NOTE — Progress Notes (Signed)
Patient ID: [name], female   DOB: 12-Mar-1926, 83 y.o.   MRN: [id number]  Chief Complaint  Patient presents with  . Follow-up    mammogram    HPI [name] is a 82 y.o. female. who presents for a breast evaluation. The most recent mammogram was done on 11/16/15 .  Patient does perform regular self breast checks and gets regular mammograms done.   I have reviewed the history of present illness with the patient.   HPI  Past Medical History  Diagnosis Date  . Carotid artery disease ([place])     Doppler January, 2012, 40-59% bilateral, followup one year  . Hyperlipidemia   . Hypertension   . SOB (shortness of breath)   . Dizziness     stable now (Oct 2011) patient tells me question of TIA, we will obtain records  . Syncopal episodes     after 18 holes of golf and increase hydrochlorothiazide, resolved   . Indigestion     3 weeks, October 2011  . CAD (coronary artery disease)     a. Nuclear 10/11, not gated, no scar or ischemia, EF 68%, low risk study; b. [place] 06/18/14: no evidence of infarction or ischemia, no WMA, normalization of TWI in precordial leads, equivocal EKG changes EF 77%, low risk study  . Rheumatic fever   . History of migraines   . Kidney stones   . GERD (gastroesophageal reflux disease)   . Diverticulitis     last colonoscopy  incomplete Jan 2011  . Hx of CABG     a. 3 vessel in 1999  . Gait abnormality     Patient complains of "wobbly gait", December, 2013  . Sinus bradycardia     Mild, December, 2013  . Diffuse cystic mastopathy   . C. difficile colitis     Past Surgical History  Procedure Laterality Date  . Esophagus stretched    . Appendectomy  1950  . Coronary artery bypass graft  1999  . Tonsillectomy  1939  . Total abdominal hysterectomy  1968    due to metrorrhagia  . Cataract extraction Right 2009  . Cataract extraction Left 2011  . Colonoscopy  [id number]    Dr. Jamal Collin  . Breast biopsy Right 1994  . Breast cyst  aspiration      multiple BIL    Family History  Problem Relation Age of Onset  . Heart disease Mother   . Heart disease Father   . Cancer Neg Hx   . Drug abuse Neg Hx   . Breast cancer Neg Hx     Social History Social History  Substance Use Topics  . Smoking status: Former Smoker -- 0.50 packs/day for 40 years    Types: Cigarettes    Quit date: 09/20/1983  . Smokeless tobacco: Never Used  . Alcohol Use: Yes     Comment: yes, social wine    Allergies  Allergen Reactions  . Codeine Nausea And Vomiting    Current Outpatient Prescriptions  Medication Sig Dispense Refill  . ALPRAZolam (XANAX) 0.25 MG tablet 1 to 2 tablets at bedtime as needed for insomnia 60 tablet 3  . amLODipine (NORVASC) 2.5 MG tablet TAKE 1 TABLET EVERY DAY 90 tablet 2  . aspirin 81 MG tablet Take 81 mg by mouth daily.      . calcium-vitamin D (SM CALCIUM 500/VITAMIN D3) 500-400 MG-UNIT per tablet Take 1 tablet by mouth daily.      . clopidogrel (PLAVIX) 75  MG tablet Take 1 tablet (75 mg total) by mouth daily. 90 tablet 1  . esomeprazole (NEXIUM) 40 MG capsule TAKE 1 CAPSULE DAILY BEFORE BREAKFAST 90 capsule 1  . hyoscyamine (LEVSIN SL) 0.125 MG SL tablet Place 1 tablet (0.125 mg total) under the tongue every 4 (four) hours as needed. 30 tablet 0  . isosorbide mononitrate (IMDUR) 30 MG 24 hr tablet Take 30 mg by mouth as needed.     Marland Kitchen losartan (COZAAR) 100 MG tablet TAKE 1 TABLET EVERY DAY 90 tablet 2  . multivitamin (THERAGRAN) per tablet Take 1 tablet by mouth daily.      . nitroGLYCERIN (NITROSTAT) 0.4 MG SL tablet Place 1 tablet (0.4 mg total) under the tongue every 5 (five) minutes as needed for chest pain. 25 tablet 6  . Psyllium-Calcium (METAMUCIL PLUS CALCIUM) CAPS Take 2 capsules by mouth daily as needed.     . simvastatin (ZOCOR) 40 MG tablet TAKE ONE TABLET AT BEDTIME 90 tablet 3  . temazepam (RESTORIL) 15 MG capsule Take 1 capsule (15 mg total) by mouth at bedtime as needed for sleep (insomnia,  may repeat in one hour). 60 capsule 2  . tioconazole (VAGISTAT) 6.5 % vaginal ointment Place 1 applicator vaginally once. 8 g 0  . tiotropium (SPIRIVA HANDIHALER) 18 MCG inhalation capsule Place 1 capsule (18 mcg total) into inhaler and inhale daily. 30 capsule 2   No current facility-administered medications for this visit.    Review of Systems Review of Systems  Constitutional: Negative.   Respiratory: Negative.   Cardiovascular: Negative.     Blood pressure 128/74, pulse 68, resp. rate 12, height 5\' 3"  (1.6 m), weight 135 lb (61.236 kg).  Physical Exam Physical Exam  Constitutional: She is oriented to person, place, and time. She appears well-developed and well-nourished.  Eyes: Conjunctivae are normal. No scleral icterus.  Neck: Neck supple.  Cardiovascular: Normal rate, regular rhythm and normal heart sounds.   Pulmonary/Chest: Effort normal and breath sounds normal. Right breast exhibits no inverted nipple, no mass, no nipple discharge, no skin change and no tenderness. Left breast exhibits no inverted nipple, no mass, no nipple discharge, no skin change and no tenderness.  Abdominal: Soft. Bowel sounds are normal.  Lymphadenopathy:    She has no cervical adenopathy.    She has no axillary adenopathy.  Neurological: She is alert and oriented to person, place, and time.  Skin: Skin is warm and dry.  Psychiatric: Her behavior is normal.    Data Reviewed Mammogram reviewed-stable  Assessment    Stable exam. History of FCD.       Plan    Patient will be asked to return to the office in one year with a bilateral screening mammogram.    PCP:  [name] This information has been scribed by Gaspar Cola CMA.    Chey Cho G 11/30/2015, 3:46 PM

## 2015-12-14 ENCOUNTER — Other Ambulatory Visit: Payer: Self-pay | Admitting: Internal Medicine

## 2015-12-29 ENCOUNTER — Telehealth: Payer: Self-pay | Admitting: Internal Medicine

## 2015-12-29 DIAGNOSIS — E559 Vitamin D deficiency, unspecified: Secondary | ICD-10-CM

## 2015-12-29 DIAGNOSIS — Z13820 Encounter for screening for osteoporosis: Secondary | ICD-10-CM

## 2015-12-29 DIAGNOSIS — E2839 Other primary ovarian failure: Secondary | ICD-10-CM

## 2015-12-29 NOTE — Telephone Encounter (Signed)
Pt is calling for her bone density appointment. She does not have an order in. Please advise.

## 2015-12-29 NOTE — Telephone Encounter (Signed)
Order in.

## 2016-01-05 ENCOUNTER — Ambulatory Visit (INDEPENDENT_AMBULATORY_CARE_PROVIDER_SITE_OTHER): Payer: Medicare Other | Admitting: Family Medicine

## 2016-01-05 ENCOUNTER — Encounter: Payer: Self-pay | Admitting: Family Medicine

## 2016-01-05 VITALS — BP 118/68 | HR 67 | Temp 98.3°F | Ht 63.0 in | Wt 135.0 lb

## 2016-01-05 DIAGNOSIS — R059 Cough, unspecified: Secondary | ICD-10-CM

## 2016-01-05 DIAGNOSIS — R05 Cough: Secondary | ICD-10-CM | POA: Diagnosis not present

## 2016-01-05 MED ORDER — HYDROCOD POLST-CPM POLST ER 10-8 MG/5ML PO SUER: 5.0000 mL | Freq: Two times a day (BID) | ORAL | Status: DC | PRN

## 2016-01-05 MED ORDER — PREDNISONE 50 MG PO TABS: ORAL_TABLET | ORAL | Status: DC

## 2016-01-05 NOTE — Patient Instructions (Signed)
Take the prednisone as prescribed.  Use the cough medication nightly as needed.  Follow up if you fail to improve or worsen  Take care  Dr. Lacinda Axon

## 2016-01-05 NOTE — Progress Notes (Signed)
Pre visit review using our clinic review tool, if applicable. No additional management support is needed unless otherwise documented below in the visit note. 

## 2016-01-06 DIAGNOSIS — R059 Cough, unspecified: Secondary | ICD-10-CM | POA: Insufficient documentation

## 2016-01-06 DIAGNOSIS — R05 Cough: Secondary | ICD-10-CM | POA: Insufficient documentation

## 2016-01-06 NOTE — Assessment & Plan Note (Signed)
New acute problem. Non productive cough. No current evidence to support COPD exacerbation. Holding off on antibiotics at this time, especially given her history of C. Difficile. Treating with prednisone and Tussionex.

## 2016-01-06 NOTE — Progress Notes (Signed)
Subjective:  Patient ID: [name], female    DOB: 06-07-1926  Age: 82 y.o. MRN: [id number]  CC: Cough  HPI:  82 year old female with a history of COPD presents with the above complaint.  Cough  Patient had a dry cough for the past 5 days.  Mild in severity.  No associated fevers or chills.  She reports mild shortness of breath.  No other respiratory symptoms.  She is concerned this may be a component of allergies given the amount of pollen.  No medications or other interventions tried.  No known exacerbating or relieving factors.  Social Hx   Social History   Social History  . Marital Status: Married    Spouse Name: N/A  . Number of Children: 2  . Years of Education: N/A   Occupational History  .     Social History Main Topics  . Smoking status: Former Smoker -- 0.50 packs/day for 40 years    Types: Cigarettes    Quit date: 09/20/1983  . Smokeless tobacco: Never Used  . Alcohol Use: Yes     Comment: yes, social wine  . Drug Use: No  . Sexual Activity: Not Asked   Other Topics Concern  . None   Social History Narrative   Review of Systems  Constitutional: Negative for fever.  Respiratory: Positive for cough and shortness of breath.     Objective:  BP 118/68 mmHg  Pulse 67  Temp(Src) 98.3 F (36.8 C) (Oral)  Ht 5\' 3"  (1.6 m)  Wt 135 lb (61.236 kg)  BMI 23.92 kg/m2  SpO2 97%  BP/Weight 01/05/2016 11/30/2015 123456  Systolic BP 123456 [id number]  Diastolic BP 68 74 78  Wt. (Lbs) 135 135 135.5  BMI 23.92 23.92 24.01    Physical Exam  Constitutional: She appears well-developed. No distress.  HENT:  Mouth/Throat: Oropharynx is clear and moist.  Normal TM's bilaterally.  Cardiovascular: Normal rate and regular rhythm.   2/6 systolic murmur.  Pulmonary/Chest: Effort normal. No respiratory distress. She has no wheezes. She has no rales.  Neurological: She is alert.  Psychiatric: She has a normal mood and affect.  Vitals  reviewed.   Lab Results  Component Value Date   WBC 6.4 02/09/2015   HGB 13.7 02/09/2015   HCT 40.8 02/09/2015   PLT 184.0 02/09/2015   GLUCOSE 109* 11/09/2015   CHOL 142 02/09/2015   TRIG 136.0 02/09/2015   HDL 48.40 02/09/2015   LDLDIRECT 72.0 11/09/2015   LDLCALC 66 02/09/2015   ALT 15 11/09/2015   AST 21 11/09/2015   NA 141 11/09/2015   K 4.5 11/09/2015   CL 104 11/09/2015   CREATININE 1.18 11/09/2015   BUN 20 11/09/2015   CO2 27 11/09/2015   TSH 1.44 02/09/2015   INR 1.0 06/17/2014   MICROALBUR 2.5* 12/26/2011    Assessment & Plan:   Problem List Items Addressed This Visit    Cough - Primary    New acute problem. Non productive cough. No current evidence to support COPD exacerbation. Holding off on antibiotics at this time, especially given her history of C. Difficile. Treating with prednisone and Tussionex.         Meds ordered this encounter  Medications  . predniSONE (DELTASONE) 50 MG tablet    Sig: 1 tablet daily x 5 days.    Dispense:  5 tablet    Refill:  0  . chlorpheniramine-HYDROcodone (TUSSIONEX PENNKINETIC ER) 10-8 MG/5ML SUER    Sig: Take 5 mLs  by mouth every 12 (twelve) hours as needed.    Dispense:  115 mL    Refill:  0    Follow-up: PRN  [place]

## 2016-01-07 ENCOUNTER — Ambulatory Visit
Admission: RE | Admit: 2016-01-07 | Discharge: 2016-01-07 | Disposition: A | Discharge status: Home / Self Care | Payer: Medicare Other | Source: Ambulatory Visit | Attending: Internal Medicine | Admitting: Internal Medicine

## 2016-01-07 DIAGNOSIS — E2839 Other primary ovarian failure: Secondary | ICD-10-CM

## 2016-01-07 DIAGNOSIS — Z13820 Encounter for screening for osteoporosis: Secondary | ICD-10-CM | POA: Insufficient documentation

## 2016-01-07 DIAGNOSIS — M8580 Other specified disorders of bone density and structure, unspecified site: Secondary | ICD-10-CM | POA: Insufficient documentation

## 2016-01-07 DIAGNOSIS — Z780 Asymptomatic menopausal state: Secondary | ICD-10-CM | POA: Insufficient documentation

## 2016-01-07 DIAGNOSIS — M85851 Other specified disorders of bone density and structure, right thigh: Secondary | ICD-10-CM | POA: Diagnosis not present

## 2016-01-10 ENCOUNTER — Encounter: Payer: Self-pay | Admitting: Internal Medicine

## 2016-01-25 DIAGNOSIS — H8110 Benign paroxysmal vertigo, unspecified ear: Secondary | ICD-10-CM | POA: Diagnosis not present

## 2016-01-26 NOTE — Telephone Encounter (Signed)
Mailed unread message to patient.  

## 2016-01-31 DIAGNOSIS — R0989 Other specified symptoms and signs involving the circulatory and respiratory systems: Secondary | ICD-10-CM | POA: Insufficient documentation

## 2016-02-03 ENCOUNTER — Other Ambulatory Visit: Payer: Self-pay | Admitting: Internal Medicine

## 2016-02-07 DIAGNOSIS — L089 Local infection of the skin and subcutaneous tissue, unspecified: Secondary | ICD-10-CM | POA: Diagnosis not present

## 2016-02-07 DIAGNOSIS — S80811A Abrasion, right lower leg, initial encounter: Secondary | ICD-10-CM | POA: Diagnosis not present

## 2016-02-07 DIAGNOSIS — L03115 Cellulitis of right lower limb: Secondary | ICD-10-CM | POA: Diagnosis not present

## 2016-02-10 ENCOUNTER — Encounter: Payer: Self-pay | Admitting: Internal Medicine

## 2016-02-10 ENCOUNTER — Ambulatory Visit (INDEPENDENT_AMBULATORY_CARE_PROVIDER_SITE_OTHER): Payer: Medicare Other | Admitting: Internal Medicine

## 2016-02-10 ENCOUNTER — Telehealth: Payer: Self-pay | Admitting: Internal Medicine

## 2016-02-10 VITALS — BP 144/74 | HR 75 | Temp 97.6°F | Resp 12 | Ht 63.0 in | Wt 131.2 lb

## 2016-02-10 DIAGNOSIS — R42 Dizziness and giddiness: Secondary | ICD-10-CM

## 2016-02-10 DIAGNOSIS — M8580 Other specified disorders of bone density and structure, unspecified site: Secondary | ICD-10-CM | POA: Diagnosis not present

## 2016-02-10 DIAGNOSIS — L97911 Non-pressure chronic ulcer of unspecified part of right lower leg limited to breakdown of skin: Secondary | ICD-10-CM

## 2016-02-10 DIAGNOSIS — X32XXXA Exposure to sunlight, initial encounter: Secondary | ICD-10-CM | POA: Diagnosis not present

## 2016-02-10 DIAGNOSIS — L821 Other seborrheic keratosis: Secondary | ICD-10-CM | POA: Diagnosis not present

## 2016-02-10 DIAGNOSIS — L570 Actinic keratosis: Secondary | ICD-10-CM | POA: Diagnosis not present

## 2016-02-10 DIAGNOSIS — S8011XA Contusion of right lower leg, initial encounter: Secondary | ICD-10-CM | POA: Diagnosis not present

## 2016-02-10 NOTE — Telephone Encounter (Signed)
Ok to schedule in 4:30 appt slot today per Dr. Derrel Nip.

## 2016-02-10 NOTE — Progress Notes (Signed)
Pre-visit discussion using our clinic review tool. No additional management support is needed unless otherwise documented below in the visit note.  

## 2016-02-10 NOTE — Progress Notes (Signed)
Subjective:  Patient ID: [name], female    DOB: 30-Nov-1925  Age: 82 y.o. MRN: [id number]  CC: The primary encounter diagnosis was Osteopenia. Diagnoses of Dizziness and giddiness and Traumatic ulcer of lower leg, right, limited to breakdown of skin [facility]) were also pertinent to this visit.  HPI [name] presents for evaluation of several issues:  1) she sustained a wound on right posterior calf from a car door 2 weeks ago.  Has been Using vaseline and wound spray for a week,  But by last Saturday  The entire leg was throbbing with pain and red  So she [facility] cellutlisi with  Topical mupirocin and oral doxycycline on May 21 .  Leg is not feeling better but the redness and swelling have resolved.  She saw her Dr Kellie Moor, her dermatologist today and  The blister was debrided and a small hematoma was evacuated.   She has a histor of c dificile colitis and is taking probiotics daily. Has not had any diarrhea.  2) recurrent dizziness.  Positional,  Brought on with sudden changes.  Occasional symptoms of vertigo.  Has been having allergy symptoms with congestion.  Cough has resolved from April visit with Dr Lacinda Axon. symptoms are currently  mild. Meds and diet reviewed. She is taking BP medications and does not drink adequate amounts of water daily , despite prior history of syncope due to dehydration ,  No longer taking a diruetic. Marland Kitchen No history of recent falls. .    Outpatient Prescriptions Prior to Visit  Medication Sig Dispense Refill  . amLODipine (NORVASC) 2.5 MG tablet TAKE 1 TABLET EVERY DAY 90 tablet 2  . aspirin 81 MG tablet Take 81 mg by mouth daily.      . calcium-vitamin D (SM CALCIUM 500/VITAMIN D3) 500-400 MG-UNIT per tablet Take 1 tablet by mouth daily.      . clopidogrel (PLAVIX) 75 MG tablet TAKE 1 TABLET EVERY DAY 90 tablet 1  . esomeprazole (NEXIUM) 40 MG capsule TAKE 1 CAPSULE DAILY BEFORE BREAKFAST 90 capsule 1  . losartan (COZAAR)  100 MG tablet TAKE 1 TABLET EVERY DAY 90 tablet 2  . multivitamin (THERAGRAN) per tablet Take 1 tablet by mouth daily.      . nitroGLYCERIN (NITROSTAT) 0.4 MG SL tablet Place 1 tablet (0.4 mg total) under the tongue every 5 (five) minutes as needed for chest pain. 25 tablet 6  . Psyllium-Calcium (METAMUCIL PLUS CALCIUM) CAPS Take 2 capsules by mouth daily as needed.     . simvastatin (ZOCOR) 40 MG tablet TAKE ONE TABLET AT BEDTIME 90 tablet 3  . tiotropium (SPIRIVA HANDIHALER) 18 MCG inhalation capsule Place 1 capsule (18 mcg total) into inhaler and inhale daily. 30 capsule 2  . ALPRAZolam (XANAX) 0.25 MG tablet 1 to 2 tablets at bedtime as needed for insomnia (Patient not taking: Reported on 02/10/2016) 60 tablet 3  . chlorpheniramine-HYDROcodone (TUSSIONEX PENNKINETIC ER) 10-8 MG/5ML SUER Take 5 mLs by mouth every 12 (twelve) hours as needed. (Patient not taking: Reported on 02/10/2016) 115 mL 0  . hyoscyamine (LEVSIN SL) 0.125 MG SL tablet Place 1 tablet (0.125 mg total) under the tongue every 4 (four) hours as needed. (Patient not taking: Reported on 02/10/2016) 30 tablet 0  . isosorbide mononitrate (IMDUR) 30 MG 24 hr tablet Take 30 mg by mouth as needed. Reported on 02/10/2016    . predniSONE (DELTASONE) 50 MG tablet 1 tablet daily x  5 days. (Patient not taking: Reported on 02/10/2016) 5 tablet 0  . temazepam (RESTORIL) 15 MG capsule TAKE ONE CAPSULE AT BEDTIME IF NEEDED FOR SLEEP. MAY REPEAT IN ONE HOUR. (Patient not taking: Reported on 02/10/2016) 60 capsule 2  . tioconazole (VAGISTAT) 6.5 % vaginal ointment Place 1 applicator vaginally once. (Patient not taking: Reported on 02/10/2016) 8 g 0   No facility-administered medications prior to visit.    Review of Systems;  Patient denies headache, fevers, malaise, unintentional weight loss, skin rash, eye pain, sinus congestion and sinus pain, sore throat, dysphagia,  hemoptysis , cough, dyspnea, wheezing, chest pain, palpitations, orthopnea, edema,  abdominal pain, nausea, melena, diarrhea, constipation, flank pain, dysuria, hematuria, urinary  Frequency, nocturia, numbness, tingling, seizures,  Focal weakness, Loss of consciousness,  Tremor, insomnia, depression, anxiety, and suicidal ideation.      Objective:  BP 144/74 mmHg  Pulse 75  Temp(Src) 97.6 F (36.4 C) (Oral)  Resp 12  Ht 5\' 3"  (1.6 m)  Wt 131 lb 4 oz (59.535 kg)  BMI 23.26 kg/m2  SpO2 97%  BP Readings from Last 3 Encounters:  02/10/16 144/74  01/05/16 118/68  11/30/15 128/74    Wt Readings from Last 3 Encounters:  02/10/16 131 lb 4 oz (59.535 kg)  01/05/16 135 lb (61.236 kg)  11/30/15 135 lb (61.236 kg)    General appearance: alert, cooperative and appears stated age Ears: normal TM's and external ear canals both ears Neck: no adenopathy, no carotid bruit, supple, symmetrical, trachea midline and thyroid not enlarged, symmetric, no tenderness/mass/nodules Back: symmetric, no curvature. ROM normal. No CVA tenderness. Lungs: clear to auscultation bilaterally Heart: regular rate and rhythm, S1, S2 normal, no murmur, click, rub or gallop Abdomen: soft, non-tender; bowel sounds normal; no masses,  no organomegaly Pulses: 2+ and symmetric Skin: Tight calf with quarter sized superficial ulcer,  No purulent drainage or surrounding erythema.  Venous stasis changes noted.  Lymph nodes: Cervical, supraclavicular, and axillary nodes normal. Neuro: CNs 2-12 intact. DTRs 2+/4 in biceps, brachioradialis, patellars and achilles. Muscle strength 5/5 in upper and lower exremities. Fine resting tremor bilaterally both hands cerebellar function normal. Romberg negative.  No pronator drift.   Gait normal.   No results found for: HGBA1C  Lab Results  Component Value Date   CREATININE 1.18 11/09/2015   CREATININE 1.19 02/09/2015   CREATININE 1.38* 06/18/2014    Lab Results  Component Value Date   WBC 6.4 02/09/2015   HGB 13.7 02/09/2015   HCT 40.8 02/09/2015   PLT  184.0 02/09/2015   GLUCOSE 109* 11/09/2015   CHOL 142 02/09/2015   TRIG 136.0 02/09/2015   HDL 48.40 02/09/2015   LDLDIRECT 72.0 11/09/2015   LDLCALC 66 02/09/2015   ALT 15 11/09/2015   AST 21 11/09/2015   NA 141 11/09/2015   K 4.5 11/09/2015   CL 104 11/09/2015   CREATININE 1.18 11/09/2015   BUN 20 11/09/2015   CO2 27 11/09/2015   TSH 1.44 02/09/2015   INR 1.0 06/17/2014   MICROALBUR 2.5* 12/26/2011    Dg Bone Density  01/07/2016  EXAM: DUAL X-RAY ABSORPTIOMETRY (DXA) FOR BONE MINERAL DENSITY IMPRESSION: Dear Dr. Derrel Nip, Your patient Jhavia Revoir completed a BMD test on 01/07/2016 using the [place] (analysis version: 14.10) manufactured by [employer]. The following summarizes the results of our evaluation. PATIENT BIOGRAPHICAL: Name: Sheleen, Emick Patient ID: [id number] Birth Date: 28-Dec-1925 Height: 62.0 in. Gender: Female Exam Date: 01/07/2016 Weight: 133.8 lbs. Indications: Advanced  Age, Caucasian, COPD, Height Loss, History of Fracture (Adult), Hysterectomy, Oophorectomy Bilateral, Postmenopausal, surgical induced menopause Fractures: Ankle Treatments: ASPRIN 81 MG, CALCIUM VIT D, Multi-Vitamin with calcium, plavix, spiriva ASSESSMENT: The BMD measured at Femur Neck Right is 0.828 g/cm2 with a T-score of -1.5. This patient is considered osteopenic according to [facility]) criteria. L-3 was excluded due to degenerative changes. Site Region Measured Measured WHO Young Adult BMD Date       Age      Classification T-score AP Spine L1-L3 01/07/2016 87.3 Normal -0.1 1.165 g/cm2 DualFemur Neck Right 01/07/2016 87.3 Osteopenia -1.5 0.828 g/cm2 World Health Organization [facility]) criteria for post-menopausal, Caucasian Women: Normal:       T-score at or above -1 SD Osteopenia:   T-score between -1 and -2.5 SD Osteoporosis: T-score at or below -2.5 SD RECOMMENDATIONS: [place] recommends that FDA-approved medical therapies be considered  in postmenopausal women and men age 27 or older with a: 1. Hip or vertebral (clinical or morphometric) fracture. 2. T-score of < -2.5 at the spine or hip. 3. Ten-year fracture probability by FRAX of 3% or greater for hip fracture or 20% or greater for major osteoporotic fracture. All treatment decisions require clinical judgment and consideration of individual patient factors, including patient preferences, co-morbidities, previous drug use, risk factors not captured in the FRAX model (e.g. falls, vitamin D deficiency, increased bone turnover, interval significant decline in bone density) and possible under - or over-estimation of fracture risk by FRAX. All patients should ensure an adequate intake of dietary calcium (1200 mg/d) and vitamin D (800 IU daily) unless contraindicated. FOLLOW-UP: People with diagnosed cases of osteoporosis or at high risk for fracture should have regular bone mineral density tests. For patients eligible for Medicare, routine testing is allowed once every 2 years. The testing frequency can be increased to one year for patients who have rapidly progressing disease, those who are receiving or discontinuing medical therapy to restore bone mass, or have additional risk factors. I have reviewed this report, and agree with the above findings. [facility] Dear Dr. Derrel Nip, Your patient HENDY SONNE completed a FRAX assessment on 01/07/2016 using the [place] (analysis version: 14.10) manufactured by [employer]. The following summarizes the results of our evaluation. PATIENT BIOGRAPHICAL: Name: Siann, Butto Patient ID: [id number] Birth Date: 23-Mar-1926 Height:    62.0 in. Gender:     Female    [age]       Weight:    133.8 lbs. Ethnicity:  White                            Exam Date: 01/07/2016 FRAX* RESULTS:  (version: 3.5) 10-year Probability of Fracture1 Major Osteoporotic Fracture2 Hip Fracture 17.8% 4.7% Population: [place] (Caucasian) Risk Factors:  History of Fracture (Adult) Based on Femur (Right) Neck BMD 1 -The 10-year probability of fracture may be lower than reported if the patient has received treatment. 2 -Major Osteoporotic Fracture: Clinical Spine, Forearm, Hip or Shoulder *FRAX is a Materials engineer of the [employer] for Metabolic Bone Disease, a [place] (WHO) [facility]. ASSESSMENT: The probability of a major osteoporotic fracture is 17.8% within the next ten years. The probability of a hip fracture is 4.7% within the next ten years. . Electronically Signed   By: Lowella Grip III M.D.   On: 01/07/2016 11:46  Assessment & Plan:   Problem List Items Addressed This Visit    Osteopenia - Primary     Bone Density scores received, she has osteopenia,  Moderate.  . Continue calcium, vitamin d and weight bearing exercise on a regular basis.       Dizziness and giddiness    HEENT and neurologic exams are normal but she is slightly orthostatic,  Reminded to increase water intake  And allow more time between standing and ambulation .       Traumatic ulcer of lower leg ([place])    Wound has been treated for cellulitis with topical and oral anti Staph antibiotics,  And was debrided of hematoma today  By dermatology.  Continue to keep wound covered ,  Clean wound daily with sterile saline.  Return 1 week. Tetanus vaccination is up to date.          I am having Ms. Hoctor maintain her aspirin, calcium-vitamin D, METAMUCIL PLUS CALCIUM, multivitamin, esomeprazole, isosorbide mononitrate, hyoscyamine, ALPRAZolam, tiotropium, tioconazole, nitroGLYCERIN, amLODipine, simvastatin, losartan, clopidogrel, predniSONE, chlorpheniramine-HYDROcodone, temazepam, doxycycline, and mupirocin ointment.  Meds ordered this encounter  Medications  . doxycycline (VIBRAMYCIN) 100 MG capsule    Sig: Take 100 mg by mouth 2 (two) times daily.   . mupirocin ointment (BACTROBAN) 2 %    Sig: Apply 1  application topically 2 (two) times daily.     There are no discontinued medications.  Follow-up: Return in about 1 week (around 02/17/2016) for wound foillo wup 15 min ok to put at 11:30 or 4:30 .   [name], MD

## 2016-02-10 NOTE — Telephone Encounter (Signed)
appt was scheduled 

## 2016-02-10 NOTE — Telephone Encounter (Signed)
Pt is feeling dizzy and there is a spot on her leg that she would liked looked at but  only wants to see Dr. Derrel Nip.Marland Kitchen Please advise

## 2016-02-10 NOTE — Patient Instructions (Addendum)
Your cellulitis is resolving,  But your pain is probably coming from the bruised muscle in your leg  Keep the wound covered ALWAYS..  If you shower,  Keep it from getting wet by using Saran wrap of a bread bag with a rubber band at the top.  continue the doxycycline for at least 5 days total,  And keep taking your probiotics.   You can continue  using the mupirocin antibiotic ointment  2 times daily until the wound has healed , instead of petroleum   Flush the wound once daily with sterile saline (after your shower)   You can rest your leg on an ice pack  15 minutes twice daily to help with the swelling, and Elevate legs when you can    You need to drink 48 ounces of non caffeinated,  Non alcoholic beverage DAILY to stay hydrated  Your blood pressure is fine today

## 2016-02-13 DIAGNOSIS — M810 Age-related osteoporosis without current pathological fracture: Secondary | ICD-10-CM | POA: Insufficient documentation

## 2016-02-13 DIAGNOSIS — I83009 Varicose veins of unspecified lower extremity with ulcer of unspecified site: Secondary | ICD-10-CM | POA: Insufficient documentation

## 2016-02-13 DIAGNOSIS — L97909 Non-pressure chronic ulcer of unspecified part of unspecified lower leg with unspecified severity: Secondary | ICD-10-CM | POA: Insufficient documentation

## 2016-02-13 DIAGNOSIS — R42 Dizziness and giddiness: Secondary | ICD-10-CM | POA: Insufficient documentation

## 2016-02-13 NOTE — Assessment & Plan Note (Signed)
Wound has been treated for cellulitis with topical and oral anti Staph antibiotics,  And was debrided of hematoma today  By dermatology.  Continue to keep wound covered ,  Clean wound daily with sterile saline.  Return 1 week. Tetanus vaccination is up to date.

## 2016-02-13 NOTE — Assessment & Plan Note (Signed)
HEENT and neurologic exams are normal but she is slightly orthostatic,  Reminded to increase water intake  And allow more time between standing and ambulation .

## 2016-02-13 NOTE — Assessment & Plan Note (Signed)
Bone Density scores received, she has osteopenia,  Moderate.  . Continue calcium, vitamin d and weight bearing exercise on a regular basis.

## 2016-02-17 ENCOUNTER — Encounter: Payer: Medicare Other | Attending: Internal Medicine | Admitting: Internal Medicine

## 2016-02-17 DIAGNOSIS — X58XXXA Exposure to other specified factors, initial encounter: Secondary | ICD-10-CM | POA: Diagnosis not present

## 2016-02-17 DIAGNOSIS — I10 Essential (primary) hypertension: Secondary | ICD-10-CM | POA: Diagnosis not present

## 2016-02-17 DIAGNOSIS — Z951 Presence of aortocoronary bypass graft: Secondary | ICD-10-CM | POA: Insufficient documentation

## 2016-02-17 DIAGNOSIS — J449 Chronic obstructive pulmonary disease, unspecified: Secondary | ICD-10-CM | POA: Diagnosis not present

## 2016-02-17 DIAGNOSIS — E785 Hyperlipidemia, unspecified: Secondary | ICD-10-CM | POA: Diagnosis not present

## 2016-02-17 DIAGNOSIS — I2510 Atherosclerotic heart disease of native coronary artery without angina pectoris: Secondary | ICD-10-CM | POA: Insufficient documentation

## 2016-02-17 DIAGNOSIS — K219 Gastro-esophageal reflux disease without esophagitis: Secondary | ICD-10-CM | POA: Insufficient documentation

## 2016-02-17 DIAGNOSIS — S81831A Puncture wound without foreign body, right lower leg, initial encounter: Secondary | ICD-10-CM | POA: Insufficient documentation

## 2016-02-17 DIAGNOSIS — Z87891 Personal history of nicotine dependence: Secondary | ICD-10-CM | POA: Insufficient documentation

## 2016-02-17 DIAGNOSIS — S81801A Unspecified open wound, right lower leg, initial encounter: Secondary | ICD-10-CM | POA: Diagnosis not present

## 2016-02-18 NOTE — Progress Notes (Signed)
RAINN, IMPARATO ([id number]) Visit Report for 02/17/2016 Chief Complaint Document Details Patient Name: [name], [name] Date of Service: 02/17/2016 9:30 AM Medical Record Patient Account Number: [id number] Number: Treating RN: Ahmed Prima 04/03/1926 (82 y.o. Other Clinician: Date of Birth/Sex: Female) Treating Elois Averitt Primary Care Physician/Extender: [place] Physician: Referring Physician: SELF, REFERRED Weeks in Treatment: 0 Information Obtained from: Patient Chief Complaint The patient is here for review of wounds on the posterior right leg which were initially trauma getting out of her car Electronic Signature(s) Signed: 02/17/2016 5:25:36 PM By: Linton Ham MD Entered By: Linton Ham on 02/17/2016 12:45:17 [name] ([id number]) -------------------------------------------------------------------------------- Debridement Details Patient Name: [name] Date of Service: 02/17/2016 9:30 AM Medical Record Patient Account Number: [id number] Number: Treating RN: Ahmed Prima 1925-12-15 (82 y.o. Other Clinician: Date of Birth/Sex: Female) Treating Aldous Housel Primary Care Physician/Extender: [place] Physician: Referring Physician: SELF, REFERRED Weeks in Treatment: 0 Debridement Performed for Wound #1 Right,Posterior Lower Leg Assessment: Performed By: Physician Ricard Dillon, MD Debridement: Debridement Pre-procedure Yes Verification/Time Out Taken: Start Time: 10:25 Pain Control: Lidocaine 4% Topical Solution Level: Skin/Subcutaneous Tissue Total Area Debrided (L x 1 (cm) x 1 (cm) = 1 (cm) W): Tissue and other Viable, Non-Viable, Exudate, Fibrin/Slough, Subcutaneous material debrided: Instrument: Curette Bleeding: Minimum Hemostasis Achieved: Pressure End Time: 10:28 Procedural Pain: 0 Post Procedural Pain: 0 Response to Treatment: Procedure was tolerated well Post  Debridement Measurements of Total Wound Length: (cm) 1 Width: (cm) 1 Depth: (cm) 0.1 Volume: (cm) 0.079 Post Procedure Diagnosis Same as Pre-procedure Electronic Signature(s) Signed: 02/17/2016 5:25:36 PM By: Linton Ham MD Signed: 02/17/2016 5:48:23 PM By: Alric Quan Entered By: Linton Ham on 02/17/2016 12:44:36 Ausborn, [name] ([id number]) [name], [name] ([id number]) -------------------------------------------------------------------------------- HPI Details Patient Name: [name] Date of Service: 02/17/2016 9:30 AM Medical Record Patient Account Number: [id number] Number: Treating RN: Ahmed Prima [birth year] (82 y.o. Other Clinician: Date of Birth/Sex: Female) Treating Nassir Neidert Primary Care Physician/Extender: [place] Physician: Referring Physician: SELF, REFERRED Weeks in Treatment: 0 History of Present Illness HPI Description: 02/17/16; this is a reasonably healthy woman who was on both Plavix and ASA. 2-1/2 weeks ago she from his thighs to her posterior right leg while getting out of the car. She is not exactly certain how this happened. She apparently was left with a hematoma and some open wounds. She was seen in an urgent clear 3 weeks ago given topical antibiotics and doxycycline.. She does not have a history of diabetes or prior wound history. She is here for my review of this. Electronic Signature(s) Signed: 02/17/2016 5:25:36 PM By: Linton Ham MD Entered By: Linton Ham on 02/17/2016 12:47:20 [name] ([id number]) -------------------------------------------------------------------------------- Physical Exam Details Patient Name: [name] Date of Service: 02/17/2016 9:30 AM Medical Record Patient Account Number: [id number] Number: Treating RN: Ahmed Prima 1925/12/16 (82 y.o. Other Clinician: Date of Birth/Sex: Female) Treating Marka Treloar Primary Care  Physician/Extender: [place] Physician: Referring Physician: SELF, REFERRED Weeks in Treatment: 0 Constitutional Sitting or standing Blood Pressure is within target range for patient.. Pulse regular and within target range for patient.Marland Kitchen Respirations regular, non-labored and within target range.. Temperature is normal and within the target range for the patient.. Patient's appearance is neat and clean. Appears in no acute distress. Well nourished and well developed.. Eyes Conjunctivae clear. No discharge. No scleral icterus. Neck .Marland Kitchen Respiratory Respiratory effort is easy and symmetric bilaterally. Rate is normal  at rest and on room air.. Bilateral breath sounds are clear and equal in all lobes with no wheezes, rales or rhonchi.. Cardiovascular Heart rhythm and rate regular, without murmur or gallop.. Pedal pulses palpable and strong bilaterally. Femoral pulses were faint however. No major abnormalities were seen. Gastrointestinal (GI) Abdomen is soft and non-distended without masses or tenderness. Bowel sounds active in all quadrants.. No liver or spleen enlargement or tenderness.. Lymphatic None palpable in the popliteal or inguinal areas. Psychiatric No evidence of depression, anxiety, or agitation. Calm, cooperative, and communicative. Appropriate interactions and affect.. Notes Wound exam; the areas on her posterior right calf in the mid aspect. Small superficial wounds covered by eschar and some nonviable subcutaneous tissue that was debrided, after which the wounds appear clean. She has some subcutaneous swelling and tenderness. I think this is probably residual hematoma perhaps beyond the subcutaneous level. I don't believe this is an infection Engineer, maintenance) Signed: 02/17/2016 5:25:36 PM By: Linton Ham MD Entered By: Linton Ham on 02/17/2016 12:50:00 MARTY, PICKET ([id number]) [name]  ([id number]) -------------------------------------------------------------------------------- Physician Orders Details Patient Name: [name] Date of Service: 02/17/2016 9:30 AM Medical Record Patient Account Number: [id number] Number: Treating RN: Ahmed Prima 10-20-1925 (82 y.o. Other Clinician: Date of Birth/Sex: Female) Treating Cherlynn Popiel Primary Care Physician/Extender: [place] Physician: Referring Physician: SELF, REFERRED Weeks in Treatment: 0 Verbal / Phone Orders: Yes Clinician: Pinkerton, Debi Read Back and Verified: Yes Diagnosis Coding Wound Cleansing Wound #1 Right,Posterior Lower Leg o Clean wound with Normal Saline. Anesthetic Wound #1 Right,Posterior Lower Leg o Topical Lidocaine 4% cream applied to wound bed prior to debridement Primary Wound Dressing Wound #1 Right,Posterior Lower Leg o Prisma Ag Secondary Dressing o ABD pad o Dry Gauze Dressing Change Frequency Wound #1 Right,Posterior Lower Leg o Change dressing every week Follow-up Appointments Wound #1 Right,Posterior Lower Leg o Return Appointment in 1 week. Edema Control Wound #1 Right,Posterior Lower Leg o 2 Layer Lite Compression System - Right Lower Extremity o Elevate legs to the level of the heart and pump ankles as often as possible Additional Orders / Instructions Wound #1 Right,Posterior Lower Leg o Increase protein intake. THRESSA, BAYLIFF ([id number]) Electronic Signature(s) Signed: 02/17/2016 5:25:36 PM By: Linton Ham MD Signed: 02/17/2016 5:48:23 PM By: Alric Quan Entered By: Alric Quan on 02/17/2016 10:48:31 [name] ([id number]) -------------------------------------------------------------------------------- Problem List Details Patient Name: [name] Date of Service: 02/17/2016 9:30 AM Medical Record Patient Account Number: [id number] Number: Treating RN: Ahmed Prima 1926/09/16 (82 y.o. Other Clinician: Date of Birth/Sex: Female) Treating Onda Kattner Primary Care Physician/Extender: [place] Physician: Referring Physician: SELF, REFERRED Weeks in Treatment: 0 Active Problems ICD-10 Encounter Code Description Active Date Diagnosis S81.831A Puncture wound without foreign body, right lower leg, 02/17/2016 Yes initial encounter Inactive Problems Resolved Problems Electronic Signature(s) Signed: 02/17/2016 5:25:36 PM By: Linton Ham MD Entered By: Linton Ham on 02/17/2016 12:42:59 Steffy, [name] ([id number]) -------------------------------------------------------------------------------- Progress Note Details Patient Name: [name] Date of Service: 02/17/2016 9:30 AM Medical Record Patient Account Number: [id number] Number: Treating RN: Ahmed Prima September 14, 1926 (82 y.o. Other Clinician: Date of Birth/Sex: Female) Treating Benford Asch Primary Care Physician/Extender: [place] Physician: Referring Physician: SELF, REFERRED Weeks in Treatment: 0 Subjective Chief Complaint Information obtained from Patient The patient is here for review of wounds on the posterior right leg which were initially trauma getting out of her car History of Present Illness (HPI) 02/17/16; this is a  reasonably healthy woman who was on both Plavix and ASA. 2-1/2 weeks ago she from his thighs to her posterior right leg while getting out of the car. She is not exactly certain how this happened. She apparently was left with a hematoma and some open wounds. She was seen in an urgent clear 3 weeks ago given topical antibiotics and doxycycline.. She does not have a history of diabetes or prior wound history. She is here for my review of this. Wound History Patient presents with 1 open wound that has been present for approximately 3 wks. Patient has been treating wound in the following manner: abt cream.  Laboratory tests have not been performed in the last month. Patient reportedly has not tested positive for an antibiotic resistant organism. Patient reportedly has not tested positive for osteomyelitis. Patient reportedly has not had testing performed to evaluate circulation in the legs. Patient experiences the following problems associated with their wounds: swelling. Patient History Information obtained from Patient. Allergies codeine (Severity: Severe, Reaction: nausea) Family History Cancer - Paternal Grandparents, Maternal Grandparents, Heart Disease - Siblings, No family history of Diabetes, Hereditary Spherocytosis, Hypertension, Kidney Disease, Lung Disease, Seizures, Stroke, Thyroid Problems, Tuberculosis. Social History Former smoker - quit years ago, Marital Status - Widowed, Alcohol Use - Daily, Drug Use - No History, Caffeine Use - Daily. OFELIA, ASQUITH ([id number]) Medical History Respiratory Patient has history of Chronic Obstructive Pulmonary Disease (COPD) Cardiovascular Patient has history of Arrhythmia - sinus bradycardia, Coronary Artery Disease, Hypertension Review of Systems (ROS) Constitutional Symptoms (General Health) The patient has no complaints or symptoms. Eyes The patient has no complaints or symptoms. Ear/Nose/Mouth/Throat HOH syncopal episodes Hematologic/Lymphatic The patient has no complaints or symptoms. Cardiovascular CABG 1999 hyperlipidemia Gastrointestinal GERD diverticulitis history of c-diff Endocrine The patient has no complaints or symptoms. Genitourinary kidney stones Immunological The patient has no complaints or symptoms, hx rheumatic fever Integumentary (Skin) Complains or has symptoms of Wounds. Musculoskeletal Complains or has symptoms of Muscle Weakness - right leg. Neurologic hx of migraines Objective Constitutional Sitting or standing Blood Pressure is within target range for patient.. Pulse regular and within  target range for patient.Marland Kitchen Respirations regular, non-labored and within target range.. Temperature is normal and within the target range for the patient.. Patient's appearance is neat and clean. Appears in no acute distress. Well nourished and well developed.. Vitals Time Taken: 9:28 AM, Height: 63 in, Source: Stated, Weight: 130 lbs, Source: Stated, BMI: 23, Temperature: 98.3 F, Pulse: 72 bpm, Respiratory Rate: 20 breaths/min, Blood Pressure: 142/46 mmHg. Donaldson, Lue B. ([id number]) Eyes Conjunctivae clear. No discharge. No scleral icterus. Respiratory Respiratory effort is easy and symmetric bilaterally. Rate is normal at rest and on room air.. Bilateral breath sounds are clear and equal in all lobes with no wheezes, rales or rhonchi.. Cardiovascular Heart rhythm and rate regular, without murmur or gallop.. Pedal pulses palpable and strong bilaterally. Femoral pulses were faint however. No major abnormalities were seen. Gastrointestinal (GI) Abdomen is soft and non-distended without masses or tenderness. Bowel sounds active in all quadrants.. No liver or spleen enlargement or tenderness.. Lymphatic None palpable in the popliteal or inguinal areas. Psychiatric No evidence of depression, anxiety, or agitation. Calm, cooperative, and communicative. Appropriate interactions and affect.. General Notes: Wound exam; the areas on her posterior right calf in the mid aspect. Small superficial wounds covered by eschar and some nonviable subcutaneous tissue that was debrided, after which the wounds appear clean. She has some subcutaneous swelling and tenderness. I think this is  probably residual hematoma perhaps beyond the subcutaneous level. I don't believe this is an infection Integumentary (Hair, Skin) Wound #1 status is Open. Original cause of wound was Trauma. The wound is located on the Right,Posterior Lower Leg. The wound measures 1cm length x 1cm width x 0.1cm depth; 0.785cm^2  area and 0.079cm^3 volume. The wound is limited to skin breakdown. There is no tunneling or undermining noted. There is a large amount of serosanguineous drainage noted. The wound margin is thickened. There is medium (34-66%) red granulation within the wound bed. There is a medium (34-66%) amount of necrotic tissue within the wound bed including Adherent Slough. The periwound skin appearance exhibited: Localized Edema, Moist, Erythema. The surrounding wound skin color is noted with erythema which is circumferential. Periwound temperature was noted as No Abnormality. The periwound has tenderness on palpation. Assessment Active Problems ICD-10 [id number] - Puncture wound without foreign body, right lower leg, initial encounter Cush, Jezlyn B. ([id number]) Procedures Wound #1 Wound #1 is a Trauma, Other located on the Right,Posterior Lower Leg . There was a Skin/Subcutaneous Tissue Debridement [id number]) debridement with total area of 1 sq cm performed by Ricard Dillon, MD. with the following instrument(s): Curette to remove Viable and Non-Viable tissue/material including Exudate, Fibrin/Slough, and Subcutaneous after achieving pain control using Lidocaine 4% Topical Solution. A time out was conducted prior to the start of the procedure. A Minimum amount of bleeding was controlled with Pressure. The procedure was tolerated well with a pain level of 0 throughout and a pain level of 0 following the procedure. Post Debridement Measurements: 1cm length x 1cm width x 0.1cm depth; 0.079cm^3 volume. Post procedure Diagnosis Wound #1: Same as Pre-Procedure Plan Wound Cleansing: Wound #1 Right,Posterior Lower Leg: Clean wound with Normal Saline. Anesthetic: Wound #1 Right,Posterior Lower Leg: Topical Lidocaine 4% cream applied to wound bed prior to debridement Primary Wound Dressing: Wound #1 Right,Posterior Lower Leg: Prisma Ag Secondary Dressing: ABD pad Dry Gauze Dressing Change  Frequency: Wound #1 Right,Posterior Lower Leg: Change dressing every week Follow-up Appointments: Wound #1 Right,Posterior Lower Leg: Return Appointment in 1 week. Edema Control: Wound #1 Right,Posterior Lower Leg: 2 Layer Lite Compression System - Right Lower Extremity Elevate legs to the level of the heart and pump ankles as often as possible Additional Orders / Instructions: Wound #1 Right,Posterior Lower Leg: Increase protein intake. Morrical, Anaeli B. ([id number]) #1 we apply Prisma to the wound surface Kerlix Coban wrap that she is going to leave in place all week #2 there was nothing to culture here and I don't believe there is any active infection #3 I think she probably has a deep hematoma, I am hopeful that we will have reabsorption of this Electronic Signature(s) Signed: 02/17/2016 5:25:36 PM By: Linton Ham MD Entered By: Linton Ham on 02/17/2016 12:50:46 [name] ([id number]) -------------------------------------------------------------------------------- ROS/PFSH Details Patient Name: [name] Date of Service: 02/17/2016 9:30 AM Medical Record Patient Account Number: [id number] Number: Treating RN: Ahmed Prima 1926/05/28 (82 y.o. Other Clinician: Date of Birth/Sex: Female) Treating Taja Pentland Primary Care Physician/Extender: [place] Physician: Referring Physician: SELF, REFERRED Weeks in Treatment: 0 Information Obtained From Patient Wound History Do you currently have one or more open woundso Yes How many open wounds do you currently haveo 1 Approximately how long have you had your woundso 3 wks How have you been treating your wound(s) until nowo abt cream Has your wound(s) ever healed and then re-openedo No Have you had any lab work done in the  past montho No Have you tested positive for an antibiotic resistant organism (MRSA, VRE)o No Have you tested positive for osteomyelitis (bone infection)o  No Have you had any tests for circulation on your legso No Have you had other problems associated with your woundso Swelling Integumentary (Skin) Complaints and Symptoms: Positive for: Wounds Musculoskeletal Complaints and Symptoms: Positive for: Muscle Weakness - right leg Constitutional Symptoms (General Health) Complaints and Symptoms: No Complaints or Symptoms Eyes Complaints and Symptoms: No Complaints or Symptoms Ear/Nose/Mouth/Throat Complaints and Symptoms: Review of System Notes: HOH Cahall, Marva B. ([id number]) syncopal episodes Hematologic/Lymphatic Complaints and Symptoms: No Complaints or Symptoms Respiratory Medical History: Positive for: Chronic Obstructive Pulmonary Disease (COPD) Cardiovascular Complaints and Symptoms: Review of System Notes: CABG 1999 hyperlipidemia Medical History: Positive for: Arrhythmia - sinus bradycardia; Coronary Artery Disease; Hypertension Gastrointestinal Complaints and Symptoms: Review of System Notes: GERD diverticulitis history of c-diff Endocrine Complaints and Symptoms: No Complaints or Symptoms Genitourinary Complaints and Symptoms: Review of System Notes: kidney stones Immunological Complaints and Symptoms: No Complaints or Symptoms Complaints and Symptoms: Review of System Notes: hx rheumatic fever Neurologic Cambria, Brinlee B. ([id number]) Complaints and Symptoms: Review of System Notes: hx of migraines Family and Social History Cancer: Yes - Paternal Grandparents, Maternal Grandparents; Diabetes: No; Heart Disease: Yes - Siblings; Hereditary Spherocytosis: No; Hypertension: No; Kidney Disease: No; Lung Disease: No; Seizures: No; Stroke: No; Thyroid Problems: No; Tuberculosis: No; Former smoker - quit years ago; Marital Status - Widowed; Alcohol Use: Daily; Drug Use: No History; Caffeine Use: Daily; Financial Concerns: No; Food, Clothing or Shelter Needs: No; Support System Lacking: No;  Transportation Concerns: No; Advanced Directives: No; Patient does not want information on Advanced Directives; Do not resuscitate: No; Living Will: Yes (Not Provided); Medical Power of Attorney: No Electronic Signature(s) Signed: 02/17/2016 5:25:36 PM By: Linton Ham MD Signed: 02/17/2016 5:48:23 PM By: Alric Quan Entered By: Alric Quan on 02/17/2016 09:40:53 [name] ([id number]) -------------------------------------------------------------------------------- SuperBill Details Patient Name: [name] Date of Service: 02/17/2016 Medical Record Patient Account Number: [id number] Number: Treating RN: Ahmed Prima 1926/06/27 (82 y.o. Other Clinician: Date of Birth/Sex: Female) Treating Averianna Brugger Primary Care Physician/Extender: [place] Physician: Weeks in Treatment: 0 Referring Physician: SELF, REFERRED Diagnosis Coding ICD-10 Codes Code Description [phone] Puncture wound without foreign body, right lower leg, initial encounter Facility Procedures CPT4 Code Description: [id number] [place] VISIT-LEV 3 EST PT Modifier: Quantity: 1 CPT4 Code Description: [id number] [place] - DEB SUBQ TISSUE 20 SQ CM/< ICD-10 Description Diagnosis S81.831A Puncture wound without foreign body, right lower leg Modifier: , initial enc Quantity: 1 ounter Physician Procedures CPT4 Code Description: [id number] [place] PHYS LEVEL 3 o NEW PT ICD-10 Description Diagnosis S81.831A Puncture wound without foreign body, right lower le Modifier: g, initial enc Quantity: 1 ounter CPT4 Code Description: [id number] - WC PHYS SUBQ TISS 20 SQ CM ICD-10 Description Diagnosis S81.831A Puncture wound without foreign body, right lower le Modifier: g, initial enc Quantity: 1 Therapist, nutritional) Signed: 02/17/2016 5:25:36 PM By: Linton Ham MD Signed: 02/17/2016 5:48:23 PM By: Alric Quan Entered By: Alric Quan on 02/17/2016 16:39:23

## 2016-02-18 NOTE — Progress Notes (Signed)
SELESTINE, HOTELLING ([id number]) Visit Report for 02/17/2016 Abuse/Suicide Risk Screen Details Patient Name: [name], [name] Date of Service: 02/17/2016 9:30 AM Medical Record Patient Account Number: [id number] Number: Treating RN: Ahmed Prima [birth year] (82 y.o. Other Clinician: Date of Birth/Sex: Female) Treating ROBSON, MICHAEL Primary Care Physician/Extender: [facility] Physician: Referring Physician: SELF, REFERRED Weeks in Treatment: 0 Abuse/Suicide Risk Screen Items Answer ABUSE/SUICIDE RISK SCREEN: Has anyone close to you tried to hurt or harm you recentlyo No Do you feel uncomfortable with anyone in your familyo No Has anyone forced you do things that you didnot want to doo No Do you have any thoughts of harming yourselfo No Patient displays signs or symptoms of abuse and/or neglect. No Electronic Signature(s) Signed: 02/17/2016 5:48:23 PM By: Alric Quan Entered By: Alric Quan on 02/17/2016 09:41:12 Bendorf, [name] ([id number]) -------------------------------------------------------------------------------- Activities of Daily Living Details Patient Name: [name] Date of Service: 02/17/2016 9:30 AM Medical Record Patient Account Number: [id number] Number: Treating RN: Ahmed Prima 09-Mar-1926 (82 y.o. Other Clinician: Date of Birth/Sex: Female) Treating ROBSON, MICHAEL Primary Care Physician/Extender: [facility] Physician: Referring Physician: SELF, REFERRED Weeks in Treatment: 0 Activities of Daily Living Items Answer Activities of Daily Living (Please select one for each item) Drive Automobile Completely Able Take Medications Completely Able Use Telephone Completely Able Care for Appearance Completely Able Use Toilet Completely Able Bath / Shower Completely Able Dress Self Completely Able Feed Self Completely Able Walk Completely Able Get In / Out Bed Completely Able Housework Completely  Able Prepare Meals Completely [place] for Self Completely Able Electronic Signature(s) Signed: 02/17/2016 5:48:23 PM By: Alric Quan Entered By: Alric Quan on 02/17/2016 09:41:31 [name] ([id number]) -------------------------------------------------------------------------------- Education Assessment Details Patient Name: [name] Date of Service: 02/17/2016 9:30 AM Medical Record Patient Account Number: [id number] Number: Treating RN: Ahmed Prima 1925/12/18 (82 y.o. Other Clinician: Date of Birth/Sex: Female) Treating ROBSON, MICHAEL Primary Care Physician/Extender: [place] [facility] Physician: Referring Physician: SELF, REFERRED Weeks in Treatment: 0 Primary Learner Assessed: Patient Learning Preferences/Education Level/Primary Language Learning Preference: Explanation, Printed Material Highest Education Level: College or Above Preferred Language: English Cognitive Barrier Assessment/Beliefs Language Barrier: No Translator Needed: No Memory Deficit: No Emotional Barrier: No Cultural/Religious Beliefs Affecting Medical No Care: Physical Barrier Assessment Impaired Vision: No Impaired Hearing: Yes HOH Decreased Hand dexterity: No Knowledge/Comprehension Assessment Knowledge Level: High Comprehension Level: High Ability to understand written High instructions: Ability to understand verbal High instructions: Motivation Assessment Anxiety Level: Calm Cooperation: Cooperative Education Importance: Acknowledges Need Interest in Health Problems: Asks Questions Perception: Coherent Willingness to Engage in Self- High Management Activities: High Richardson, Amarachukwu B. ([id number]) Readiness to Engage in Self- Management Activities: Electronic Signature(s) Signed: 02/17/2016 5:48:23 PM By: Alric Quan Entered By: Alric Quan on 02/17/2016 09:42:05 [name]  ([id number]) -------------------------------------------------------------------------------- Fall Risk Assessment Details Patient Name: [name] Date of Service: 02/17/2016 9:30 AM Medical Record Patient Account Number: [id number] Number: Treating RN: Ahmed Prima 08/09/1926 (82 y.o. Other Clinician: Date of Birth/Sex: Female) Treating ROBSON, MICHAEL Primary Care Physician/Extender: [place] [facility] Physician: Referring Physician: SELF, REFERRED Weeks in Treatment: 0 Fall Risk Assessment Items Have you had 2 or more falls in the last 12 monthso 0 No Have you had any fall that resulted in injury in the last 12 monthso 0 No FALL RISK ASSESSMENT: History of falling - immediate or within 3 months 0 No Secondary diagnosis 0 No Ambulatory aid None/bed rest/wheelchair/nurse  0 No Crutches/cane/walker 0 No Furniture 0 No IV Access/Saline Lock 0 No Gait/Training Normal/bed rest/immobile 0 No Weak 0 No Impaired 0 No Mental Status Oriented to own ability 0 Yes Electronic Signature(s) Signed: 02/17/2016 5:48:23 PM By: Alric Quan Entered By: Alric Quan on 02/17/2016 09:42:23 Brumett, [name] ([id number]) -------------------------------------------------------------------------------- Foot Assessment Details Patient Name: [name] Date of Service: 02/17/2016 9:30 AM Medical Record Patient Account Number: [id number] Number: Treating RN: Ahmed Prima 1925/11/13 (82 y.o. Other Clinician: Date of Birth/Sex: Female) Treating ROBSON, MICHAEL Primary Care Physician/Extender: [place] [facility] Physician: Referring Physician: SELF, REFERRED Weeks in Treatment: 0 Foot Assessment Items Site Locations + = Sensation present, - = Sensation absent, C = Callus, U = Ulcer R = Redness, W = Warmth, M = Maceration, PU = Pre-ulcerative lesion F = Fissure, S = Swelling, D = Dryness Assessment Right: Left: Other Deformity: No No Prior  Foot Ulcer: No No Prior Amputation: No No Charcot Joint: No No Ambulatory Status: Ambulatory Without Help Gait: Steady Electronic Signature(s) Signed: 02/17/2016 5:48:23 PM By: Alric Quan Entered By: Alric Quan on 02/17/2016 09:47:04 Lisanti, [name] ([id number]) [name], [name] ([id number]) -------------------------------------------------------------------------------- Nutrition Risk Assessment Details Patient Name: [name] Date of Service: 02/17/2016 9:30 AM Medical Record Patient Account Number: [id number] Number: Treating RN: Ahmed Prima 1926-01-16 (82 y.o. Other Clinician: Date of Birth/Sex: Female) Treating ROBSON, MICHAEL Primary Care Physician/Extender: [place] [facility] Physician: Referring Physician: SELF, REFERRED Weeks in Treatment: 0 Height (in): 63 Weight (lbs): 130 Body Mass Index (BMI): 23 Nutrition Risk Assessment Items NUTRITION RISK SCREEN: I have an illness or condition that made me change the kind and/or 0 No amount of food I eat I eat fewer than two meals per day 0 No I eat few fruits and vegetables, or milk products 0 No I have three or more drinks of beer, liquor or wine almost every day 0 No I have tooth or mouth problems that make it hard for me to eat 0 No I don't always have enough money to buy the food I need 0 No I eat alone most of the time 0 No I take three or more different prescribed or over-the-counter drugs a 1 Yes day Without wanting to, I have lost or gained 10 pounds in the last six 0 No months I am not always physically able to shop, cook and/or feed myself 0 No Nutrition Protocols Good Risk Protocol Moderate Risk Protocol Electronic Signature(s) Signed: 02/17/2016 5:48:23 PM By: Alric Quan Entered By: Alric Quan on 02/17/2016 09:42:41

## 2016-02-18 NOTE — Progress Notes (Signed)
CANDANCE, HUCKE ([id number]) Visit Report for 02/17/2016 Allergy List Details Patient Name: [name], [name] Date of Service: 02/17/2016 9:30 AM Medical Record Patient Account Number: [id number] Number: Treating RN: Ahmed Prima [birth year] (82 y.o. Other Clinician: Date of Birth/Sex: Female) Treating ROBSON, MICHAEL Primary Care Physician/Extender: [facility] Physician: Referring Physician: SELF, REFERRED Weeks in Treatment: 0 Allergies Active Allergies codeine Reaction: nausea Severity: Severe Allergy Notes Electronic Signature(s) Signed: 02/17/2016 5:48:23 PM By: Alric Quan Entered By: Alric Quan on 02/17/2016 09:31:43 Pound, [name] ([id number]) -------------------------------------------------------------------------------- Arrival Information Details Patient Name: [name] Date of Service: 02/17/2016 9:30 AM Medical Record Patient Account Number: [id number] Number: Treating RN: Ahmed Prima 1926/04/07 (82 y.o. Other Clinician: Date of Birth/Sex: Female) Treating ROBSON, MICHAEL Primary Care Physician/Extender: [place] [facility] Physician: Referring Physician: SELF, REFERRED Weeks in Treatment: 0 Visit Information Patient Arrived: Ambulatory Arrival Time: 09:26 Accompanied By: self Transfer Assistance: None Patient Identification Verified: Yes Secondary Verification Process Yes Completed: Patient Requires Transmission- No Based Precautions: Patient Has Alerts: Yes Patient Alerts: Patient on Blood Thinner Plavix Electronic Signature(s) Signed: 02/17/2016 5:48:23 PM By: Alric Quan Entered By: Alric Quan on 02/17/2016 09:27:43 Borden, [name] ([id number]) -------------------------------------------------------------------------------- Clinic Level of Care Assessment Details Patient Name: [name] Date of Service: 02/17/2016 9:30 AM Medical Record Patient Account Number:  [id number] Number: Treating RN: Ahmed Prima [birth year] (82 y.o. Other Clinician: Date of Birth/Sex: Female) Treating ROBSON, MICHAEL Primary Care Physician/Extender: [place] [facility] Physician: Referring Physician: SELF, REFERRED Weeks in Treatment: 0 Clinic Level of Care Assessment Items TOOL 1 Quantity Score X - Use when EandM and Procedure is performed on INITIAL visit 1 0 ASSESSMENTS - Nursing Assessment / Reassessment X - General Physical Exam (combine w/ comprehensive assessment (listed just 1 20 below) when performed on new pt. evals) X - Comprehensive Assessment (HX, ROS, Risk Assessments, Wounds Hx, etc.) 1 25 ASSESSMENTS - Wound and Skin Assessment / Reassessment []  - Dermatologic / Skin Assessment (not related to wound area) 0 ASSESSMENTS - Ostomy and/or Continence Assessment and Care []  - Incontinence Assessment and Management 0 []  - Ostomy Care Assessment and Management (repouching, etc.) 0 PROCESS - Coordination of Care []  - Simple Patient / Family Education for ongoing care 0 X - Complex (extensive) Patient / Family Education for ongoing care 1 20 []  - Staff obtains Programmer, systems, Records, Test Results / Process Orders 0 []  - Staff telephones HHA, Nursing Homes / Clarify orders / etc 0 []  - Routine Transfer to another Facility (non-emergent condition) 0 []  - Routine Hospital Admission (non-emergent condition) 0 X - New Admissions / Biomedical engineer / Ordering NPWT, Apligraf, etc. 1 15 []  - Emergency Hospital Admission (emergent condition) 0 PROCESS - Special Needs []  - Pediatric / Minor Patient Management 0 Ingham, Halimah B. ([id number]) []  - Isolation Patient Management 0 []  - Hearing / Language / Visual special needs 0 []  - Assessment of Community assistance (transportation, D/C planning, etc.) 0 []  - Additional assistance / Altered mentation 0 []  - Support Surface(s) Assessment (bed, cushion, seat, etc.) 0 INTERVENTIONS - Miscellaneous []  -  External ear exam 0 []  - Patient Transfer (multiple staff / Civil Service fast streamer / Similar devices) 0 []  - Simple Staple / Suture removal (25 or less) 0 []  - Complex Staple / Suture removal (26 or more) 0 []  - Hypo/Hyperglycemic Management (do not check if billed separately) 0 X - Ankle / Brachial Index (ABI) - do not check if billed separately 1 15 Has the  patient been seen at the hospital within the last three years: Yes Total Score: 95 Level Of Care: New/Established - Level 3 Electronic Signature(s) Signed: 02/17/2016 5:48:23 PM By: Alric Quan Entered By: Alric Quan on 02/17/2016 12:11:00 [name] ([id number]) -------------------------------------------------------------------------------- Encounter Discharge Information Details Patient Name: [name] Date of Service: 02/17/2016 9:30 AM Medical Record Patient Account Number: [id number] Number: Treating RN: Ahmed Prima Jan 25, 1926 (82 y.o. Other Clinician: Date of Birth/Sex: Female) Treating ROBSON, MICHAEL Primary Care Physician/Extender: [facility] Physician: Referring Physician: SELF, REFERRED Weeks in Treatment: 0 Encounter Discharge Information Items Discharge Pain Level: 0 Discharge Condition: Stable Ambulatory Status: Ambulatory Discharge Destination: Home Transportation: Private Auto Accompanied By: self Schedule Follow-up Appointment: Yes Medication Reconciliation completed and provided to Patient/Care Yes Raegen Tarpley: Provided on Clinical Summary of Care: 02/17/2016 Form Type Recipient Paper Patient BM Electronic Signature(s) Signed: 02/17/2016 10:46:41 AM By: Ruthine Dose Entered By: Ruthine Dose on 02/17/2016 10:46:41 [name] ([id number]) -------------------------------------------------------------------------------- Lower Extremity Assessment Details Patient Name: [name] Date of Service: 02/17/2016 9:30 AM Medical Record Patient Account  Number: [id number] Number: Treating RN: Ahmed Prima November 10, 1925 (82 y.o. Other Clinician: Date of Birth/Sex: Female) Treating ROBSON, MICHAEL Primary Care Physician/Extender: [place] [facility] Physician: Referring Physician: SELF, REFERRED Weeks in Treatment: 0 Edema Assessment Assessed: [Left: No] [Right: Yes] E[Left: dema] [Right: :] Calf Left: Right: Point of Measurement: 32 cm From Medial Instep 33 cm 34 cm Ankle Left: Right: Point of Measurement: 9 cm From Medial Instep 20.7 cm 22.5 cm Vascular Assessment Claudication: Claudication Assessment [Left:None] [Right:Intermittent] Pulses: Posterior Tibial Dorsalis Pedis Palpable: [Left:Yes] [Right:Yes] Doppler: [Left:Multiphasic] [Right:Multiphasic] Extremity colors, hair growth, and conditions: Extremity Color: [Left:Hyperpigmented] [Right:Hyperpigmented] Hair Growth on Extremity: [Left:Yes] [Right:Yes] Temperature of Extremity: [Left:Warm] [Right:Warm] Capillary Refill: [Left:< 3 seconds] [Right:< 3 seconds] Blood Pressure: Brachial: [Left:142] [Right:142] Toe Nail Assessment Left: Right: Thick: No No Discolored: No No Deformed: No No Improper Length and Hygiene: No No Jahnke, Adam B. ([id number]) Notes unable to do ABI d/t pain Electronic Signature(s) Signed: 02/17/2016 5:48:23 PM By: Alric Quan Entered By: Alric Quan on 02/17/2016 09:56:09 Mcartor, [name] ([id number]) -------------------------------------------------------------------------------- Multi Wound Chart Details Patient Name: [name] Date of Service: 02/17/2016 9:30 AM Medical Record Patient Account Number: [id number] Number: Treating RN: Ahmed Prima [birth year] (82 y.o. Other Clinician: Date of Birth/Sex: Female) Treating ROBSON, MICHAEL Primary Care Physician/Extender: [facility] Physician: Referring Physician: SELF, REFERRED Weeks in Treatment: 0 Vital Signs Height(in):  63 Pulse(bpm): 72 Weight(lbs): 130 Blood Pressure 142/46 (mmHg): Body Mass Index(BMI): 23 Temperature(F): 98.3 Respiratory Rate 20 (breaths/min): Photos: [1:No Photos] [N/A:N/A] Wound Location: [1:Right Lower Leg - Posterior] [N/A:N/A] Wounding Event: [1:Trauma] [N/A:N/A] Primary Etiology: [1:Trauma, Other] [N/A:N/A] Comorbid History: [1:Chronic Obstructive Pulmonary Disease (COPD), Arrhythmia, Coronary Artery Disease, Hypertension] [N/A:N/A] Date Acquired: [1:01/27/2016] [N/A:N/A] Weeks of Treatment: [1:0] [N/A:N/A] Wound Status: [1:Open] [N/A:N/A] Measurements L x W x D 1x1x0.1 [N/A:N/A] (cm) Area (cm) : [1:0.785] [N/A:N/A] Volume (cm) : [1:0.079] [N/A:N/A] Classification: [1:Partial Thickness] [N/A:N/A] Exudate Amount: [1:Large] [N/A:N/A] Exudate Type: [1:Serosanguineous] [N/A:N/A] Exudate Color: [1:red, brown] [N/A:N/A] Wound Margin: [1:Thickened] [N/A:N/A] Granulation Amount: [1:Medium (34-66%)] [N/A:N/A] Granulation Quality: [1:Red] [N/A:N/A] Necrotic Amount: [1:Medium (34-66%)] [N/A:N/A] Exposed Structures: [1:Fascia: No Fat: No] [N/A:N/A] Tendon: No Muscle: No Joint: No Bone: No Limited to Skin Breakdown Epithelialization: None N/A N/A Periwound Skin Texture: Edema: Yes N/A N/A Periwound Skin Moist: Yes N/A N/A Moisture: Periwound Skin Color: Erythema: Yes N/A N/A Erythema Location: Circumferential N/A N/A Temperature: No Abnormality N/A N/A Tenderness  on Yes N/A N/A Palpation: Wound Preparation: Ulcer Cleansing: N/A N/A Rinsed/Irrigated with Saline Topical Anesthetic Applied: Other: lidocaine 4% Treatment Notes Electronic Signature(s) Signed: 02/17/2016 5:48:23 PM By: Alric Quan Entered By: Alric Quan on 02/17/2016 10:03:48 [name] ([id number]) -------------------------------------------------------------------------------- Multi-Disciplinary Care Plan Details Patient Name: [name] Date of Service: 02/17/2016  9:30 AM Medical Record Patient Account Number: [id number] Number: Treating RN: Ahmed Prima October 01, 1925 (82 y.o. Other Clinician: Date of Birth/Sex: Female) Treating ROBSON, MICHAEL Primary Care Physician/Extender: [facility] Physician: Referring Physician: SELF, REFERRED Weeks in Treatment: 0 Active Inactive Orientation to the Wound Care Program Nursing Diagnoses: Knowledge deficit related to the wound healing center program Goals: Patient/caregiver will verbalize understanding of the [facility] Program Date Initiated: 02/17/2016 Goal Status: Active Interventions: Provide education on orientation to the wound center Notes: Pain, Acute or Chronic Nursing Diagnoses: Pain, acute or chronic: actual or potential Potential alteration in comfort, pain Goals: Patient will verbalize adequate pain control and receive pain control interventions during procedures as needed Date Initiated: 02/17/2016 Goal Status: Active Interventions: Assess comfort goal upon admission Complete pain assessment as per visit requirements Notes: Wound/Skin Impairment Delorenzo, Katalyn B. ([id number]) Nursing Diagnoses: Impaired tissue integrity Goals: Ulcer/skin breakdown will have a volume reduction of 30% by week 4 Date Initiated: 02/17/2016 Goal Status: Active Ulcer/skin breakdown will have a volume reduction of 50% by week 8 Date Initiated: 02/17/2016 Goal Status: Active Ulcer/skin breakdown will have a volume reduction of 80% by week 12 Date Initiated: 02/17/2016 Goal Status: Active Interventions: Assess patient/caregiver ability to obtain necessary supplies Assess ulceration(s) every visit Notes: Electronic Signature(s) Signed: 02/17/2016 5:48:23 PM By: Alric Quan Entered By: Alric Quan on 02/17/2016 10:03:34 [name] ([id number]) -------------------------------------------------------------------------------- Pain Assessment Details Patient  Name: [name] Date of Service: 02/17/2016 9:30 AM Medical Record Patient Account Number: [id number] Number: Treating RN: Ahmed Prima [birth year] (82 y.o. Other Clinician: Date of Birth/Sex: Female) Treating ROBSON, MICHAEL Primary Care Physician/Extender: [facility] Physician: Referring Physician: SELF, REFERRED Weeks in Treatment: 0 Active Problems Location of Pain Severity and Description of Pain Patient Has Paino Yes Site Locations Pain Location: Pain in Ulcers With Dressing Change: Yes Rate the pain. Current Pain Level: 5 Character of Pain Describe the Pain: Heavy Pain Management and Medication Current Pain Management: Electronic Signature(s) Signed: 02/17/2016 5:48:23 PM By: Alric Quan Entered By: Alric Quan on 02/17/2016 09:28:26 [name] ([id number]) -------------------------------------------------------------------------------- Patient/Caregiver Education Details Patient Name: [name] Date of Service: 02/17/2016 9:30 AM Medical Record Patient Account Number: [id number] Number: Treating RN: Ahmed Prima Sep 28, 1925 (82 y.o. Other Clinician: Date of Birth/Gender: Female) Treating ROBSON, MICHAEL Primary Care Physician/Extender: [facility] Physician: Weeks in Treatment: 0 Referring Physician: SELF, REFERRED Education Assessment Education Provided To: Patient Education Topics Provided Wound/Skin Impairment: Handouts: Other: change dressing as ordered Methods: Demonstration, Explain/Verbal Responses: State content correctly Electronic Signature(s) Signed: 02/17/2016 5:48:23 PM By: Alric Quan Entered By: Alric Quan on 02/17/2016 10:04:59 [name] ([id number]) -------------------------------------------------------------------------------- Wound Assessment Details Patient Name: [name] Date of Service: 02/17/2016 9:30 AM Medical Record Patient  Account Number: [id number] Number: Treating RN: Ahmed Prima 21-Oct-1925 (82 y.o. Other Clinician: Date of Birth/Sex: Female) Treating ROBSON, MICHAEL Primary Care Physician/Extender: [place] [facility] Physician: Referring Physician: SELF, REFERRED Weeks in Treatment: 0 Wound Status Wound Number: 1 Primary Trauma, Other Etiology: Wound Location: Right Lower Leg - Posterior Wound Open Wounding Event: Trauma Status: Date Acquired: 01/27/2016 Comorbid Chronic Obstructive Pulmonary  Disease Weeks Of Treatment: 0 History: (COPD), Arrhythmia, Coronary Artery Clustered Wound: No Disease, Hypertension Photos Photo Uploaded By: Alric Quan on 02/17/2016 17:46:56 Wound Measurements Length: (cm) 1 Width: (cm) 1 Depth: (cm) 0.1 Area: (cm) 0.785 Volume: (cm) 0.079 % Reduction in Area: % Reduction in Volume: Epithelialization: None Tunneling: No Undermining: No Wound Description Classification: Partial Thickness Wound Margin: Thickened Exudate Amount: Large Exudate Type: Serosanguineous Exudate Color: red, brown Foul Odor After Cleansing: No Wound Bed Granulation Amount: Medium (34-66%) Exposed Structure Dowse, Rikayla B. ([id number]) Granulation Quality: Red Fascia Exposed: No Necrotic Amount: Medium (34-66%) Fat Layer Exposed: No Necrotic Quality: Adherent Slough Tendon Exposed: No Muscle Exposed: No Joint Exposed: No Bone Exposed: No Limited to Skin Breakdown Periwound Skin Texture Texture Color No Abnormalities Noted: No No Abnormalities Noted: No Localized Edema: Yes Erythema: Yes Erythema Location: Circumferential Moisture No Abnormalities Noted: No Temperature / Pain Moist: Yes Temperature: No Abnormality Tenderness on Palpation: Yes Wound Preparation Ulcer Cleansing: Rinsed/Irrigated with Saline Topical Anesthetic Applied: Other: lidocaine 4%, Treatment Notes Wound #1 (Right, Posterior Lower Leg) 1. Cleansed with: Clean wound  with Normal Saline 2. Anesthetic Topical Lidocaine 4% cream to wound bed prior to debridement 4. Dressing Applied: Prisma Ag 5. Secondary Dressing Applied ABD Pad Dry Gauze 7. Secured with Tape 2 Layer Lite Compression System - Right Lower Extremity Electronic Signature(s) Signed: 02/17/2016 5:48:23 PM By: Alric Quan Entered By: Alric Quan on 02/17/2016 09:59:32 Chapdelaine, [name] ([id number]) -------------------------------------------------------------------------------- Vitals Details Patient Name: [name] Date of Service: 02/17/2016 9:30 AM Medical Record Patient Account Number: [id number] Number: Treating RN: Ahmed Prima 06-27-1926 (82 y.o. Other Clinician: Date of Birth/Sex: Female) Treating ROBSON, MICHAEL Primary Care Physician/Extender: [facility] Physician: Referring Physician: SELF, REFERRED Weeks in Treatment: 0 Vital Signs Time Taken: 09:28 Temperature (F): 98.3 Height (in): 63 Pulse (bpm): 72 Source: Stated Respiratory Rate (breaths/min): 20 Weight (lbs): 130 Blood Pressure (mmHg): 142/46 Source: Stated Reference Range: 80 - 120 mg / dl Body Mass Index (BMI): 23 Electronic Signature(s) Signed: 02/17/2016 5:48:23 PM By: Alric Quan Entered By: Alric Quan on 02/17/2016 09:30:35

## 2016-02-24 ENCOUNTER — Encounter: Payer: Medicare Other | Attending: Internal Medicine | Admitting: Internal Medicine

## 2016-02-24 DIAGNOSIS — X58XXXA Exposure to other specified factors, initial encounter: Secondary | ICD-10-CM | POA: Diagnosis not present

## 2016-02-24 DIAGNOSIS — Z87891 Personal history of nicotine dependence: Secondary | ICD-10-CM | POA: Insufficient documentation

## 2016-02-24 DIAGNOSIS — J449 Chronic obstructive pulmonary disease, unspecified: Secondary | ICD-10-CM | POA: Diagnosis not present

## 2016-02-24 DIAGNOSIS — E785 Hyperlipidemia, unspecified: Secondary | ICD-10-CM | POA: Insufficient documentation

## 2016-02-24 DIAGNOSIS — I10 Essential (primary) hypertension: Secondary | ICD-10-CM | POA: Diagnosis not present

## 2016-02-24 DIAGNOSIS — I2510 Atherosclerotic heart disease of native coronary artery without angina pectoris: Secondary | ICD-10-CM | POA: Insufficient documentation

## 2016-02-24 DIAGNOSIS — Z951 Presence of aortocoronary bypass graft: Secondary | ICD-10-CM | POA: Diagnosis not present

## 2016-02-24 DIAGNOSIS — K219 Gastro-esophageal reflux disease without esophagitis: Secondary | ICD-10-CM | POA: Diagnosis not present

## 2016-02-24 DIAGNOSIS — S81831A Puncture wound without foreign body, right lower leg, initial encounter: Secondary | ICD-10-CM | POA: Insufficient documentation

## 2016-02-24 DIAGNOSIS — S81801A Unspecified open wound, right lower leg, initial encounter: Secondary | ICD-10-CM | POA: Diagnosis not present

## 2016-02-25 NOTE — Progress Notes (Signed)
SHEVAWN, HOFER ([id number]) Visit Report for 02/24/2016 Arrival Information Details Patient Name: [name], [name] Date of Service: 02/24/2016 2:15 PM Medical Record Patient Account Number: [id number] Number: Treating RN: Ahmed Prima [birth year] (82 y.o. Other Clinician: Date of Birth/Sex: Female) Treating ROBSON, MICHAEL Primary Care Physician/Extender: [place] Physician: Referring Physician: Solon Augusta in Treatment: 1 Visit Information History Since Last Visit All ordered tests and consults were completed: No Patient Arrived: Ambulatory Added or deleted any medications: No Arrival Time: 14:40 Any new allergies or adverse reactions: No Accompanied By: self Had a fall or experienced change in No Transfer Assistance: None activities of daily living that may affect Patient Identification Verified: Yes risk of falls: Secondary Verification Process Yes Signs or symptoms of abuse/neglect since last No Completed: visito Patient Requires Transmission- No Hospitalized since last visit: No Based Precautions: Pain Present Now: No Patient Has Alerts: Yes Patient Alerts: Patient on Blood Thinner Plavix Electronic Signature(s) Signed: 02/24/2016 5:55:43 PM By: Alric Quan Entered By: Alric Quan on 02/24/2016 14:42:59 Meckler, [name] ([id number]) -------------------------------------------------------------------------------- Encounter Discharge Information Details Patient Name: [name] Date of Service: 02/24/2016 2:15 PM Medical Record Patient Account Number: [id number] Number: Treating RN: Ahmed Prima 01-16-1926 (82 y.o. Other Clinician: Date of Birth/Sex: Female) Treating ROBSON, MICHAEL Primary Care Physician/Extender: [place] Physician: Referring Physician: Solon Augusta in Treatment: 1 Encounter Discharge Information Items Discharge Pain Level: 0 Discharge Condition:  Stable Ambulatory Status: Ambulatory Discharge Destination: Home Transportation: Private Auto Accompanied By: self Schedule Follow-up Appointment: Yes Medication Reconciliation completed and provided to Patient/Care Yes Yesennia Hirota: Provided on Clinical Summary of Care: 02/24/2016 Form Type Recipient Paper Patient BM Electronic Signature(s) Signed: 02/24/2016 3:19:32 PM By: Ruthine Dose Entered By: Ruthine Dose on 02/24/2016 15:19:32 [name] ([id number]) -------------------------------------------------------------------------------- Lower Extremity Assessment Details Patient Name: [name] Date of Service: 02/24/2016 2:15 PM Medical Record Patient Account Number: [id number] Number: Treating RN: Ahmed Prima Mar 19, 1926 (82 y.o. Other Clinician: Date of Birth/Sex: Female) Treating ROBSON, MICHAEL Primary Care Physician/Extender: [place] Physician: Referring Physician: Solon Augusta in Treatment: 1 Edema Assessment Assessed: [Left: No] [Right: No] E[Left: dema] [Right: :] Calf Left: Right: Point of Measurement: 32 cm From Medial Instep cm 34.6 cm Ankle Left: Right: Point of Measurement: 9 cm From Medial Instep cm 22.3 cm Vascular Assessment Pulses: Posterior Tibial Dorsalis Pedis Palpable: [Right:Yes] Extremity colors, hair growth, and conditions: Extremity Color: [Right:Hyperpigmented] Temperature of Extremity: [Right:Warm] Capillary Refill: [Right:< 3 seconds] Toe Nail Assessment Left: Right: Thick: No Discolored: No Deformed: No Improper Length and Hygiene: No Electronic Signature(s) Signed: 02/24/2016 5:55:43 PM By: Alric Quan Entered By: Alric Quan on 02/24/2016 14:48:18 Kalata, [name] ([id number]) [name], [name] ([id number]) -------------------------------------------------------------------------------- Multi Wound Chart Details Patient Name: [name] Date of Service: 02/24/2016  2:15 PM Medical Record Patient Account Number: [id number] Number: Treating RN: Ahmed Prima May 30, 1926 (82 y.o. Other Clinician: Date of Birth/Sex: Female) Treating ROBSON, MICHAEL Primary Care Physician/Extender: [place] Physician: Referring Physician: Solon Augusta in Treatment: 1 Vital Signs Height(in): 63 Pulse(bpm): 71 Weight(lbs): 130 Blood Pressure 138/52 (mmHg): Body Mass Index(BMI): 23 Temperature(F): 97.8 Respiratory Rate 20 (breaths/min): Photos: [1:No Photos] [N/A:N/A] Wound Location: [1:Right Lower Leg - Posterior] [N/A:N/A] Wounding Event: [1:Trauma] [N/A:N/A] Primary Etiology: [1:Trauma, Other] [N/A:N/A] Comorbid History: [1:Chronic Obstructive Pulmonary Disease (COPD), Arrhythmia, Coronary Artery Disease, Hypertension] [N/A:N/A] Date Acquired: [1:01/27/2016] [N/A:N/A] Weeks of Treatment: [1:1] [N/A:N/A] Wound Status: [1:Open] [N/A:N/A] Measurements L x W x D 1x0.8x0.1 [  N/A:N/A] (cm) Area (cm) : [1:0.628] [N/A:N/A] Volume (cm) : [1:0.063] [N/A:N/A] % Reduction in Area: [1:20.00%] [N/A:N/A] % Reduction in Volume: 20.30% [N/A:N/A] Classification: [1:Partial Thickness] [N/A:N/A] Exudate Amount: [1:Large] [N/A:N/A] Exudate Type: [1:Serosanguineous] [N/A:N/A] Exudate Color: [1:red, brown] [N/A:N/A] Wound Margin: [1:Thickened] [N/A:N/A] Granulation Amount: [1:Medium (34-66%)] [N/A:N/A] Granulation Quality: [1:Red] [N/A:N/A] Necrotic Amount: [1:Medium (34-66%)] [N/A:N/A] Exposed Structures: Fascia: No N/A N/A Fat: No Tendon: No Muscle: No Joint: No Bone: No Limited to Skin Breakdown Epithelialization: None N/A N/A Periwound Skin Texture: Edema: Yes N/A N/A Periwound Skin Moist: Yes N/A N/A Moisture: Periwound Skin Color: Erythema: Yes N/A N/A Erythema Location: Circumferential N/A N/A Temperature: No Abnormality N/A N/A Tenderness on Yes N/A N/A Palpation: Wound Preparation: Ulcer Cleansing: N/A  N/A Rinsed/Irrigated with Saline Topical Anesthetic Applied: Other: lidocaine 4% Treatment Notes Electronic Signature(s) Signed: 02/24/2016 5:55:43 PM By: Alric Quan Entered By: Alric Quan on 02/24/2016 14:56:04 [name] ([id number]) -------------------------------------------------------------------------------- [place] Details Patient Name: [name] Date of Service: 02/24/2016 2:15 PM Medical Record Patient Account Number: [id number] Number: Treating RN: Ahmed Prima 1926-05-11 (82 y.o. Other Clinician: Date of Birth/Sex: Female) Treating ROBSON, MICHAEL Primary Care Physician/Extender: [place] Physician: Referring Physician: Solon Augusta in Treatment: 1 Active Inactive Orientation to the Wound Care Program Nursing Diagnoses: Knowledge deficit related to the wound healing center program Goals: Patient/caregiver will verbalize understanding of the [place] Program Date Initiated: 02/17/2016 Goal Status: Active Interventions: Provide education on orientation to the wound center Notes: Pain, Acute or Chronic Nursing Diagnoses: Pain, acute or chronic: actual or potential Potential alteration in comfort, pain Goals: Patient will verbalize adequate pain control and receive pain control interventions during procedures as needed Date Initiated: 02/17/2016 Goal Status: Active Interventions: Assess comfort goal upon admission Complete pain assessment as per visit requirements Notes: Wound/Skin Impairment [place], Katrina B. ([id number]) Nursing Diagnoses: Impaired tissue integrity Goals: Ulcer/skin breakdown will have a volume reduction of 30% by week 4 Date Initiated: 02/17/2016 Goal Status: Active Ulcer/skin breakdown will have a volume reduction of 50% by week 8 Date Initiated: 02/17/2016 Goal Status: Active Ulcer/skin breakdown will have a volume reduction of 80% by week  12 Date Initiated: 02/17/2016 Goal Status: Active Interventions: Assess patient/caregiver ability to obtain necessary supplies Assess ulceration(s) every visit Notes: Electronic Signature(s) Signed: 02/24/2016 5:55:43 PM By: Alric Quan Entered By: Alric Quan on 02/24/2016 14:55:57 Severs, [name] ([id number]) -------------------------------------------------------------------------------- Pain Assessment Details Patient Name: [name] Date of Service: 02/24/2016 2:15 PM Medical Record Patient Account Number: [id number] Number: Treating RN: Ahmed Prima [birth year] (82 y.o. Other Clinician: Date of Birth/Sex: Female) Treating ROBSON, MICHAEL Primary Care Physician/Extender: [place] [facility] Physician: Referring Physician: Solon Augusta in Treatment: 1 Active Problems Location of Pain Severity and Description of Pain Patient Has Paino No Site Locations Pain Management and Medication Current Pain Management: Electronic Signature(s) Signed: 02/24/2016 5:55:43 PM By: Alric Quan Entered By: Alric Quan on 02/24/2016 14:43:05 [name] ([id number]) -------------------------------------------------------------------------------- Patient/Caregiver Education Details Patient Name: [name] Date of Service: 02/24/2016 2:15 PM Medical Record Patient Account Number: [id number] Number: Treating RN: Ahmed Prima 1925-11-23 (82 y.o. Other Clinician: Date of Birth/Gender: Female) Treating ROBSON, MICHAEL Primary Care Physician/Extender: [place] Physician: Suella [place] in Treatment: 1 Referring Physician: Deborra Medina Education Assessment Education Provided To: Patient Education Topics Provided Wound/Skin Impairment: Handouts: Other: do not get wrap wet Methods: Demonstration, Explain/Verbal Responses: State content correctly Electronic Signature(s) Signed: 02/24/2016 5:55:43 PM By: Alric Quan Entered  By: Alric Quan on 02/24/2016 15:01:10 [name] ([id number]) -------------------------------------------------------------------------------- Wound Assessment Details Patient Name: AMANDAMARIE, FRANKFORD Date of Service: 02/24/2016 2:15 PM Medical Record Patient Account Number: [id number] Number: Treating RN: Ahmed Prima 1926-06-07 (82 y.o. Other Clinician: Date of Birth/Sex: Female) Treating ROBSON, MICHAEL Primary Care Physician/Extender: [place] Physician: Referring Physician: Solon Augusta in Treatment: 1 Wound Status Wound Number: 1 Primary Trauma, Other Etiology: Wound Location: Right Lower Leg - Posterior Wound Open Wounding Event: Trauma Status: Date Acquired: 01/27/2016 Comorbid Chronic Obstructive Pulmonary Disease Weeks Of Treatment: 1 History: (COPD), Arrhythmia, Coronary Artery Clustered Wound: No Disease, Hypertension Photos Photo Uploaded By: Alric Quan on 02/24/2016 16:49:33 Wound Measurements Length: (cm) 1 Width: (cm) 0.8 Depth: (cm) 0.1 Area: (cm) 0.628 Volume: (cm) 0.063 % Reduction in Area: 20% % Reduction in Volume: 20.3% Epithelialization: None Tunneling: No Undermining: No Wound Description Classification: Partial Thickness Wound Margin: Thickened Exudate Amount: Large Exudate Type: Serosanguineous Exudate Color: red, brown Foul Odor After Cleansing: No Wound Bed Granulation Amount: Medium (34-66%) Exposed Structure Julio, Miyoshi B. ([id number]) Granulation Quality: Red Fascia Exposed: No Necrotic Amount: Medium (34-66%) Fat Layer Exposed: No Necrotic Quality: Adherent Slough Tendon Exposed: No Muscle Exposed: No Joint Exposed: No Bone Exposed: No Limited to Skin Breakdown Periwound Skin Texture Texture Color No Abnormalities Noted: No No Abnormalities Noted: No Localized Edema: Yes Erythema: Yes Erythema Location: Circumferential Moisture No Abnormalities  Noted: No Temperature / Pain Moist: Yes Temperature: No Abnormality Tenderness on Palpation: Yes Wound Preparation Ulcer Cleansing: Rinsed/Irrigated with Saline Topical Anesthetic Applied: Other: lidocaine 4%, Treatment Notes Wound #1 (Right, Posterior Lower Leg) 1. Cleansed with: Clean wound with Normal Saline 2. Anesthetic Topical Lidocaine 4% cream to wound bed prior to debridement 3. Peri-wound Care: Barrier cream 4. Dressing Applied: Prisma Ag 5. Secondary Dressing Applied ABD Pad Dry Gauze 7. Secured with Tape 2 Layer Lite Compression System - Right Lower Extremity Electronic Signature(s) Signed: 02/24/2016 5:55:43 PM By: Alric Quan Entered By: Alric Quan on 02/24/2016 14:54:28 [name] ([id number]) -------------------------------------------------------------------------------- Vitals Details Patient Name: [name] Date of Service: 02/24/2016 2:15 PM Medical Record Patient Account Number: [id number] Number: Treating RN: Ahmed Prima 1925/10/21 (82 y.o. Other Clinician: Date of Birth/Sex: Female) Treating ROBSON, MICHAEL Primary Care Physician/Extender: [place] Physician: Referring Physician: Solon Augusta in Treatment: 1 Vital Signs Time Taken: 14:43 Temperature (F): 97.8 Height (in): 63 Pulse (bpm): 71 Weight (lbs): 130 Respiratory Rate (breaths/min): 20 Body Mass Index (BMI): 23 Blood Pressure (mmHg): 138/52 Reference Range: 80 - 120 mg / dl Electronic Signature(s) Signed: 02/24/2016 5:55:43 PM By: Alric Quan Entered By: Alric Quan on 02/24/2016 14:43:23

## 2016-02-25 NOTE — Progress Notes (Signed)
RILLA, EDMAN ([id number]) Visit Report for 02/24/2016 Chief Complaint Document Details Patient Name: [name], [name] Date of Service: 02/24/2016 2:15 PM Medical Record Patient Account Number: [id number] Number: Treating RN: Ahmed Prima 12-26-1925 (82 y.o. Other Clinician: Date of Birth/Sex: Female) Treating Deeana Atwater Primary Care Physician/Extender: [place] Physician: Referring Physician: Solon Augusta in Treatment: 1 Information Obtained from: Patient Chief Complaint The patient is here for review of wounds on the posterior right leg which were initially trauma getting out of her car Electronic Signature(s) Signed: 02/24/2016 5:53:31 PM By: Linton Ham MD Entered By: Linton Ham on 02/24/2016 17:41:59 Tribbett, [name] ([id number]) -------------------------------------------------------------------------------- Debridement Details Patient Name: [name] Date of Service: 02/24/2016 2:15 PM Medical Record Patient Account Number: [id number] Number: Treating RN: Ahmed Prima 12/06/1925 (82 y.o. Other Clinician: Date of Birth/Sex: Female) Treating Mckaylah Bettendorf Primary Care Physician/Extender: [place] Physician: Referring Physician: Solon Augusta in Treatment: 1 Debridement Performed for Wound #1 Right,Posterior Lower Leg Assessment: Performed By: Physician Ricard Dillon, MD Debridement: Debridement Pre-procedure Yes Verification/Time Out Taken: Start Time: 15:01 Pain Control: Lidocaine 4% Topical Solution Level: Skin/Subcutaneous Tissue Total Area Debrided (L x 1 (cm) x 0.8 (cm) = 0.8 (cm) W): Tissue and other Viable, Non-Viable, Exudate, Fibrin/Slough, Subcutaneous material debrided: Instrument: Curette Bleeding: Minimum Hemostasis Achieved: Pressure End Time: 15:03 Procedural Pain: 0 Post Procedural Pain: 0 Response to Treatment: Procedure was tolerated well Post  Debridement Measurements of Total Wound Length: (cm) 1 Width: (cm) 0.8 Depth: (cm) 0.1 Volume: (cm) 0.063 Post Procedure Diagnosis Same as Pre-procedure Electronic Signature(s) Signed: 02/24/2016 5:53:31 PM By: Linton Ham MD Signed: 02/24/2016 5:55:43 PM By: Alric Quan Entered By: Linton Ham on 02/24/2016 17:41:47 Rabel, [name] ([id number]) [name], [name] ([id number]) -------------------------------------------------------------------------------- HPI Details Patient Name: [name] Date of Service: 02/24/2016 2:15 PM Medical Record Patient Account Number: [id number] Number: Treating RN: Ahmed Prima Jul 05, 1926 (82 y.o. Other Clinician: Date of Birth/Sex: Female) Treating Shalom Ware Primary Care Physician/Extender: [place] Physician: Referring Physician: Solon Augusta in Treatment: 1 History of Present Illness HPI Description: 02/17/16; this is a reasonably healthy woman who was on both Plavix and ASA. 2-1/2 weeks ago she traumatized her posterior right leg while getting out of the car. She is not exactly certain how this happened. She apparently was left with a hematoma and some open wounds. She was seen in an urgent clear 3 weeks ago given topical antibiotics and doxycycline.. She does not have a history of diabetes or prior wound history. She is here for my review of this. 02/24/16 No major changed still small open areas x2. 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 Other  Clinician: Date of Birth/Sex: Female) Treating Raechell Singleton Primary Care Physician/Extender: [place] Physician: Referring Physician: Solon Augusta in Treatment: 1 Notes wound exam: no major change. 2 small wounds that require debridement Electronic Signature(s) Signed: 02/24/2016 5:53:31 PM By: Linton Ham MD Entered By: Linton Ham on 02/24/2016 17:50:36 [name] ([id number]) -------------------------------------------------------------------------------- Physician Orders Details Patient Name: [name] Date of Service: 02/24/2016 2:15 PM Medical Record Patient Account Number: [id number] Number: Treating RN: Ahmed Prima February 27, 1926 (82 y.o. Other Clinician: Date  of Birth/Sex: Female) Treating Romell Cavanah Primary Care Physician/Extender: [place] Physician: Referring Physician: Solon Augusta in Treatment: 1 Verbal / Phone Orders: Yes Clinician: Pinkerton, Debi Read Back and Verified: Yes Diagnosis Coding Wound Cleansing Wound #1 Right,Posterior Lower Leg o Clean wound with Normal Saline. Anesthetic Wound #1 Right,Posterior Lower Leg o Topical Lidocaine 4% cream applied to wound bed prior to debridement Primary Wound Dressing Wound #1 Right,Posterior Lower Leg o Prisma Ag Secondary Dressing o ABD pad o Dry Gauze Dressing Change Frequency Wound #1 Right,Posterior Lower Leg o Change dressing every week Follow-up Appointments Wound #1 Right,Posterior Lower Leg o Return Appointment in 1 week. Edema Control Wound #1 Right,Posterior Lower Leg o 2 Layer Lite Compression System - Right Lower Extremity o Elevate legs to the level of the heart and pump ankles as often as possible Additional Orders / Instructions Wound #1 Right,Posterior Lower Leg o Increase protein intake. [name], [name] ([id number]) Electronic Signature(s) Signed: 02/24/2016 5:53:31 PM By: Linton Ham  MD Signed: 02/24/2016 5:55:43 PM By: Alric Quan Entered By: Alric Quan on 02/24/2016 15:03:47 [name] ([id number]) -------------------------------------------------------------------------------- Problem List Details Patient Name: [name] Date of Service: 02/24/2016 2:15 PM Medical Record Patient Account Number: [id number] Number: Treating RN: Ahmed Prima 07/06/1926 (82 y.o. Other Clinician: Date of Birth/Sex: Female) Treating Kasir Hallenbeck Primary Care Physician/Extender: [place] Physician: Referring Physician: Solon Augusta in Treatment: 1 Active Problems ICD-10 Encounter Code Description Active Date Diagnosis S81.831A Puncture wound without foreign body, right lower leg, 02/17/2016 Yes initial encounter Inactive Problems Resolved Problems Electronic Signature(s) Signed: 02/24/2016 5:53:31 PM By: Linton Ham MD Entered By: Linton Ham on 02/24/2016 17:41:33 [name], [name] ([id number]) -------------------------------------------------------------------------------- Progress Note Details Patient Name: [name] Date of Service: 02/24/2016 2:15 PM Medical Record Patient Account Number: [id number] Number: Treating RN: Ahmed Prima 08-14-1926 (82 y.o. Other Clinician: Date of Birth/Sex: Female) Treating Evelyne Makepeace Primary Care Physician/Extender: [place] Physician: Referring Physician: Solon Augusta in Treatment: 1 Subjective Chief Complaint Information obtained from Patient The patient is here for review of wounds on the posterior right leg which were initially trauma getting out of her car History of Present Illness (HPI) 02/17/16; this is a reasonably healthy woman who was on both Plavix and ASA. 2-1/2 weeks ago she traumatized her posterior right leg while getting out of the car. She is not exactly certain how this happened. She apparently was left with  a hematoma and some open wounds. She was seen in an urgent clear 3 weeks ago given topical antibiotics and doxycycline.. She does not have a history of diabetes or prior wound history. She is here for my review of this. 02/24/16 No major changed still small open areas x2. hematoma underneath.is unchaged Objective Constitutional Vitals Time Taken: 2:43 PM, Height: 63 in, Weight: 130 lbs, BMI: 23, Temperature: 97.8 F, Pulse: 71 bpm, Respiratory Rate: 20 breaths/min, Blood Pressure: 138/52 mmHg. Integumentary (Hair, Skin) Wound #1 status is Open. Original cause of wound was Trauma. The wound is located on the Right,Posterior Lower Leg. The wound measures 1cm length x 0.8cm width x 0.1cm depth; 0.628cm^2 area and 0.063cm^3 volume. The wound is limited to skin breakdown. There is no tunneling or undermining noted. There is a large amount of serosanguineous drainage noted. The wound margin is thickened. There is medium (34-66%) red granulation within the wound bed. There is a medium (34-66%) amount of necrotic tissue within the wound bed including Adherent Slough. The periwound  skin appearance exhibited: Localized Edema, Moist, Erythema. The surrounding wound skin color is noted with erythema which is circumferential. Periwound temperature was noted as No Abnormality. The periwound has tenderness on Maves, Katrina B. ([id number]) palpation. Assessment Active Problems ICD-10 [id number] - Puncture wound without foreign body, right lower leg, initial encounter Procedures Wound #1 Wound #1 is a Trauma, Other located on the Right,Posterior Lower Leg . There was a Skin/Subcutaneous Tissue Debridement [id number]) debridement with total area of 0.8 sq cm performed by Ricard Dillon, MD. with the following instrument(s): Curette to remove Viable and Non-Viable tissue/material including Exudate, Fibrin/Slough, and Subcutaneous after achieving pain control using Lidocaine 4% Topical Solution. A time  out was conducted prior to the start of the procedure. A Minimum amount of bleeding was controlled with Pressure. The procedure was tolerated well with a pain level of 0 throughout and a pain level of 0 following the procedure. Post Debridement Measurements: 1cm length x 0.8cm width x 0.1cm depth; 0.063cm^3 volume. Post procedure Diagnosis Wound #1: Same as Pre-Procedure Plan Wound Cleansing: Wound #1 Right,Posterior Lower Leg: Clean wound with Normal Saline. Anesthetic: Wound #1 Right,Posterior Lower Leg: Topical Lidocaine 4% cream applied to wound bed prior to debridement Primary Wound Dressing: Wound #1 Right,Posterior Lower Leg: Prisma Ag Secondary Dressing: ABD pad Dry Gauze Wente, Lashaunda B. ([id number]) Dressing Change Frequency: Wound #1 Right,Posterior Lower Leg: Change dressing every week Follow-up Appointments: Wound #1 Right,Posterior Lower Leg: Return Appointment in 1 week. Edema Control: Wound #1 Right,Posterior Lower Leg: 2 Layer Lite Compression System - Right Lower Extremity Elevate legs to the level of the heart and pump ankles as often as possible Additional Orders / Instructions: Wound #1 Right,Posterior Lower Leg: Increase protein intake. prisma/abd/kerlix/coban Electronic Signature(s) Signed: 02/24/2016 5:53:31 PM By: Linton Ham MD Entered By: Linton Ham on 02/24/2016 17:51:33 [name] ([id number]) -------------------------------------------------------------------------------- SuperBill Details Patient Name: [name] Date of Service: 02/24/2016 Medical Record Patient Account Number: [id number] Number: Treating RN: Ahmed Prima Feb 12, 1926 (82 y.o. Other Clinician: Date of Birth/Sex: Female) Treating Jermaine Tholl Primary Care Physician/Extender: [place] Physician: Suella Grove in Treatment: 1 Referring Physician: Deborra Medina Diagnosis Coding ICD-10 Codes Code Description [phone] Puncture wound  without foreign body, right lower leg, initial encounter Facility Procedures CPT4 Code Description: [id number] - DEB SUBQ TISSUE 20 SQ CM/< ICD-10 Description Diagnosis S81.831A Puncture wound without foreign body, right lower le Modifier: g, initial enc Quantity: 1 ounter Physician Procedures CPT4 Code Description: [id number] - WC PHYS SUBQ TISS 20 SQ CM ICD-10 Description Diagnosis S81.831A Puncture wound without foreign body, right lower le Modifier: g, initial enc Quantity: 1 Therapist, nutritional) Signed: 02/24/2016 5:53:31 PM By: Linton Ham MD Entered By: Linton Ham on 02/24/2016 17:52:03

## 2016-03-02 ENCOUNTER — Encounter: Payer: Medicare Other | Admitting: Internal Medicine

## 2016-03-02 DIAGNOSIS — Z87891 Personal history of nicotine dependence: Secondary | ICD-10-CM | POA: Diagnosis not present

## 2016-03-02 DIAGNOSIS — J449 Chronic obstructive pulmonary disease, unspecified: Secondary | ICD-10-CM | POA: Diagnosis not present

## 2016-03-02 DIAGNOSIS — I2510 Atherosclerotic heart disease of native coronary artery without angina pectoris: Secondary | ICD-10-CM | POA: Diagnosis not present

## 2016-03-02 DIAGNOSIS — S81801A Unspecified open wound, right lower leg, initial encounter: Secondary | ICD-10-CM | POA: Diagnosis not present

## 2016-03-02 DIAGNOSIS — I10 Essential (primary) hypertension: Secondary | ICD-10-CM | POA: Diagnosis not present

## 2016-03-02 DIAGNOSIS — S81831A Puncture wound without foreign body, right lower leg, initial encounter: Secondary | ICD-10-CM | POA: Diagnosis not present

## 2016-03-02 DIAGNOSIS — E785 Hyperlipidemia, unspecified: Secondary | ICD-10-CM | POA: Diagnosis not present

## 2016-03-09 ENCOUNTER — Encounter: Payer: Medicare Other | Admitting: Internal Medicine

## 2016-03-09 ENCOUNTER — Other Ambulatory Visit: Payer: Self-pay | Admitting: Cardiovascular Disease

## 2016-03-09 DIAGNOSIS — J449 Chronic obstructive pulmonary disease, unspecified: Secondary | ICD-10-CM | POA: Diagnosis not present

## 2016-03-09 DIAGNOSIS — Z87891 Personal history of nicotine dependence: Secondary | ICD-10-CM | POA: Diagnosis not present

## 2016-03-09 DIAGNOSIS — I6523 Occlusion and stenosis of bilateral carotid arteries: Secondary | ICD-10-CM

## 2016-03-09 DIAGNOSIS — S81801D Unspecified open wound, right lower leg, subsequent encounter: Secondary | ICD-10-CM | POA: Diagnosis not present

## 2016-03-09 DIAGNOSIS — I10 Essential (primary) hypertension: Secondary | ICD-10-CM | POA: Diagnosis not present

## 2016-03-09 DIAGNOSIS — E785 Hyperlipidemia, unspecified: Secondary | ICD-10-CM | POA: Diagnosis not present

## 2016-03-09 DIAGNOSIS — S81831A Puncture wound without foreign body, right lower leg, initial encounter: Secondary | ICD-10-CM | POA: Diagnosis not present

## 2016-03-09 DIAGNOSIS — I2510 Atherosclerotic heart disease of native coronary artery without angina pectoris: Secondary | ICD-10-CM | POA: Diagnosis not present

## 2016-03-10 NOTE — Progress Notes (Signed)
SHANAH, TULLOCH ([id number]) Visit Report for 03/09/2016 Chief Complaint Document Details Patient Name: Katrina, Allen Date of Service: 03/09/2016 3:15 PM Medical Record Patient Account Number: [id number] Number: Treating RN: Ahmed Prima [birth year] (82 y.o. Other Clinician: Date of Birth/Sex: Female) Treating ROBSON, MICHAEL Primary Care Physician/Extender: [place] Physician: Referring Physician: Solon Augusta in Treatment: 3 Information Obtained from: Patient Chief Complaint The patient is here for review of wounds on the posterior right leg which were initially trauma getting out of her car Electronic Signature(s) Signed: 03/09/2016 4:37:54 PM By: Linton Ham MD Entered By: Linton Ham on 03/09/2016 16:11:25 [name] ([id number]) -------------------------------------------------------------------------------- HPI Details Patient Name: [name] Date of Service: 03/09/2016 3:15 PM Medical Record Patient Account Number: [id number] Number: Treating RN: Ahmed Prima [birth year] (82 y.o. Other Clinician: Date of Birth/Sex: Female) Treating ROBSON, MICHAEL Primary Care Physician/Extender: [place] Physician: Referring Physician: Solon Augusta in Treatment: 3 History of Present Illness HPI Description: 02/17/16; this is a reasonably healthy woman who was on both Plavix and ASA. 2-1/2 weeks ago she traumatized her posterior right leg while getting out of the car. She is not exactly certain how this happened. She apparently was left with a hematoma and some open wounds. She was seen in an urgent clear 3 weeks ago given topical antibiotics and doxycycline.. She does not have a history of diabetes or prior wound history. She is here for my review of this. 02/24/16 No major changed still small open areas x2. hematoma underneath.is unchaged 03/02/16; both small wounds are debrided. The larger inferior  one is actually fully epithelialized. Small open area remains on the top wound. I think this will close. Patient traveling to [place] next week, she wants to come and see [place] before she leaves so we will see her again in a week. 6 21/17 both the wounds on the medial right leg are closed over. Electronic Signature(s) Signed: 03/09/2016 4:37:54 PM By: Linton Ham MD Entered By: Linton Ham on 03/09/2016 16:15:39 [name] ([id number]) -------------------------------------------------------------------------------- Physical Exam Details Patient Name: [name] Date of Service: 03/09/2016 3:15 PM Medical Record Patient Account Number: [id number] Number: Treating RN: Ahmed Prima [birth year] (82 y.o. Other Clinician: Date of Birth/Sex: Female) Treating ROBSON, MICHAEL Primary Care Physician/Extender: [place] Physician: Referring Physician: Solon Augusta in Treatment: 3 Cardiovascular Pedal pulses palpable and strong bilaterally.. Notes Wound exam; the 2 small areas are totally closed over. There was no need to specifically address the area. She has chronic venous insufficiency with some edema. She is going to [employer], certainly stockings would be uncomfortable and hot nevertheless she is at some risk for edema and skin breakdown. Electronic Signature(s) Signed: 03/09/2016 4:37:54 PM By: Linton Ham MD Entered By: Linton Ham on 03/09/2016 16:17:29 [name] ([id number]) -------------------------------------------------------------------------------- Physician Orders Details Patient Name: [name] Date of Service: 03/09/2016 3:15 PM Medical Record Patient Account Number: [id number] Number: Treating RN: Ahmed Prima October 31, 1925 (82 y.o. Other Clinician: Date of Birth/Sex: Female) Treating ROBSON, MICHAEL Primary Care Physician/Extender: [place] Physician: Referring  Physician: Solon Augusta in Treatment: 3 Verbal / Phone Orders: Yes Clinician: Carolyne Fiscal, Debi Read Back and Verified: Yes Diagnosis Coding Discharge From [facility] Services o Discharge from [place] compression stockings. Call us if you need anything or if you have any questions. Electronic Signature(s) Signed: 03/09/2016 4:37:54 PM By: Linton Ham MD Signed: 03/09/2016 5:56:28 PM By: Carolyne Fiscal  Debra Entered By: Alric Quan on 03/09/2016 15:43:51 [name], [name] ([id number]) -------------------------------------------------------------------------------- Problem List Details Patient Name: [name] Date of Service: 03/09/2016 3:15 PM Medical Record Patient Account Number: [id number] Number: Treating RN: Ahmed Prima 1926-03-13 (82 y.o. 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 Other Clinician: Date of Birth/Sex: Female) Treating ROBSON, MICHAEL Primary Care Physician/Extender: [place] Physician: Referring Physician: Solon Augusta in Treatment: 3 Subjective Chief Complaint Information obtained from Patient The patient is here for review of wounds on the posterior right leg which were initially trauma getting out of her car History of Present Illness (HPI) 02/17/16; this is a reasonably healthy woman who was on both Plavix and ASA. 2-1/2 weeks ago she traumatized her posterior right leg while getting out of the car. She is not exactly certain how this happened. She apparently was left with a hematoma and some open wounds. She was seen in an urgent clear 3 weeks ago given topical antibiotics and doxycycline.. She does not have a history of diabetes or prior wound history. She is here for my review of this. 02/24/16 No major changed still small open areas x2. hematoma underneath.is unchaged 03/02/16; both small wounds are debrided. The larger inferior one is actually fully epithelialized. Small open area remains on the top wound. I think this will close. Patient traveling to [place] next week, she wants to come and see [place] before she leaves so we will see her again in a week. 6 21/17 both the wounds on the medial right leg are closed over. Objective Constitutional Vitals Time Taken: 3:31 PM, Height: 63 in, Weight: 130 lbs, BMI: 23, Temperature: 97.6 F, Pulse: 76 bpm, Respiratory Rate: 20 breaths/min, Blood Pressure: 157/58 mmHg. Cardiovascular Pedal pulses palpable and strong bilaterally.. General Notes: Wound exam; the 2 small areas are totally closed over. There was no need to specifically address the area. She has chronic venous insufficiency with some edema. She is going to [employer], [place] ([id number]) tomorrow, certainly stockings would be uncomfortable and hot nevertheless she is at some risk for edema and skin breakdown. Integumentary (Hair, Skin) Wound #1 status is Healed - Epithelialized. Original cause of wound was Trauma. The wound is located on the Right,Posterior Lower  Leg. The wound measures 0cm length x 0cm width x 0cm depth; 0cm^2 area and 0cm^3 volume. Assessment Active Problems ICD-10 [id number] - Puncture wound without foreign body, right lower leg, initial encounter Plan Discharge From [facility] Services: Discharge from [place] compression stockings. Call us if you need anything or if you have any questions. #1 we have discharged her from the wound care center. I wrote her a skin prescription for 20-30 below- knee stockings. I don't know whether she will comply with these or not. Electronic Signature(s) Signed: 03/09/2016 4:37:54 PM By: Linton Ham MD Entered By: Dellia Nims,  Michael on 03/09/2016 16:18:01 [name], [name] ([id number]) -------------------------------------------------------------------------------- SuperBill Details Patient Name: [name] Date of Service: 03/09/2016 Medical Record Patient Account Number: [id number] Number: Treating RN: Ahmed Prima 10-10-1925 (82 y.o. Other Clinician: Date of Birth/Sex: Female) Treating ROBSON, MICHAEL Primary Care Physician/Extender: [place] Physician: Suella Grove in Treatment: 3 Referring Physician: Deborra Medina Diagnosis Coding ICD-10 Codes Code Description [phone] Puncture wound without foreign body, right lower leg, initial encounter L97.211 Non-pressure chronic ulcer of right calf limited to breakdown of skin Facility Procedures CPT4 Code: [id number] Description: [phone] - WOUND CARE VISIT-LEV 2 EST PT Modifier: Quantity: 1 Physician Procedures CPT4 Code Description: [id number] - WC PHYS LEVEL 2 - EST PT ICD-10 Description Diagnosis L97.211 Non-pressure chronic ulcer of right calf limited to Modifier: breakdown of ski Quantity: 1 n Electronic Signature(s) Signed: 03/09/2016 5:56:28 PM By: Alric Quan Previous Signature: 03/09/2016 4:37:54 PM Version By: Linton Ham MD Entered By: Alric Quan on 03/09/2016 16:47:12

## 2016-03-10 NOTE — Progress Notes (Signed)
PATTIANNE, Allen ([id number]) Visit Report for 03/09/2016 Arrival Information Details Patient Name: [name], [name] Date of Service: 03/09/2016 3:15 PM Medical Record Patient Account Number: [id number] Number: Treating RN: [name] 05-19-1926 (82 y.o. Other Clinician: Date of Birth/Sex: Female) Treating [name] Primary Care Physician/Extender: [name] Physician: Referring Physician: Solon Allen in Treatment: 3 Visit Information History Since Last Visit All ordered tests and consults were completed: No Patient Arrived: Ambulatory Added or deleted any medications: No Arrival Time: 15:30 Any new allergies or adverse reactions: No Accompanied By: self Had a fall or experienced change in No Transfer Assistance: None activities of daily living that may affect Patient Identification Verified: Yes risk of falls: Secondary Verification Process Yes Signs or symptoms of abuse/neglect since last No Completed: visito Patient Requires Transmission- No Hospitalized since last visit: No Based Precautions: Pain Present Now: No Patient Has Alerts: Yes Patient Alerts: Patient on Blood Thinner Plavix Electronic Signature(s) Signed: 03/09/2016 5:56:28 PM By: [name] Entered By: [name] on 03/09/2016 15:31:04 [name] ([id number]) -------------------------------------------------------------------------------- Clinic Level of Care Assessment Details Patient Name: [name] Date of Service: 03/09/2016 3:15 PM Medical Record Patient Account Number: [id number] Number: Treating RN: [name] Feb 14, 1926 (82 y.o. Other Clinician: Date of Birth/Sex: Female) Treating [name] Primary Care Physician/Extender: [name] Physician: Referring Physician: Solon Allen in Treatment: 3 Clinic Level of Care Assessment Items TOOL 4 Quantity Score X - Use when only an EandM is  performed on FOLLOW-UP visit 1 0 ASSESSMENTS - Nursing Assessment / Reassessment X - Reassessment of Co-morbidities (includes updates in patient status) 1 10 X - Reassessment of Adherence to Treatment Plan 1 5 ASSESSMENTS - Wound and Skin Assessment / Reassessment X - Simple Wound Assessment / Reassessment - one wound 1 5 []  - Complex Wound Assessment / Reassessment - multiple wounds 0 []  - Dermatologic / Skin Assessment (not related to wound area) 0 ASSESSMENTS - Focused Assessment []  - Circumferential Edema Measurements - multi extremities 0 []  - Nutritional Assessment / Counseling / Intervention 0 []  - Lower Extremity Assessment (monofilament, tuning fork, pulses) 0 []  - Peripheral Arterial Disease Assessment (using hand held doppler) 0 ASSESSMENTS - Ostomy and/or Continence Assessment and Care []  - Incontinence Assessment and Management 0 []  - Ostomy Care Assessment and Management (repouching, etc.) 0 PROCESS - Coordination of Care X - Simple Patient / Family Education for ongoing care 1 15 []  - Complex (extensive) Patient / Family Education for ongoing care 0 []  - Staff obtains Consents, Records, Test Results / Process Orders 0 Dellarocco, Radiance B. ([id number]) []  - Staff telephones HHA, Nursing Homes / Clarify orders / etc 0 []  - Routine Transfer to another Facility (non-emergent condition) 0 []  - Routine Hospital Admission (non-emergent condition) 0 []  - New Admissions / Biomedical engineer / Ordering NPWT, Apligraf, etc. 0 []  - Emergency Hospital Admission (emergent condition) 0 []  - Simple Discharge Coordination 0 X - Complex (extensive) Discharge Coordination 1 15 PROCESS - Special Needs []  - Pediatric / Minor Patient Management 0 []  - Isolation Patient Management 0 []  - Hearing / Language / Visual special needs 0 []  - Assessment of Community assistance (transportation, D/C planning, etc.) 0 []  - Additional assistance / Altered mentation 0 []  - Support Surface(s)  Assessment (bed, cushion, seat, etc.) 0 INTERVENTIONS - Wound Cleansing / Measurement []  - Simple Wound Cleansing - one wound 0 []  - Complex Wound Cleansing - multiple wounds 0 X - Wound Imaging (  photographs - any number of wounds) 1 5 []  - Wound Tracing (instead of photographs) 0 []  - Simple Wound Measurement - one wound 0 []  - Complex Wound Measurement - multiple wounds 0 INTERVENTIONS - Wound Dressings []  - Small Wound Dressing one or multiple wounds 0 []  - Medium Wound Dressing one or multiple wounds 0 []  - Large Wound Dressing one or multiple wounds 0 []  - Application of Medications - topical 0 []  - Application of Medications - injection 0 Gosch, Elodie B. ([id number]) INTERVENTIONS - Miscellaneous []  - External ear exam 0 []  - Specimen Collection (cultures, biopsies, blood, body fluids, etc.) 0 []  - Specimen(s) / Culture(s) sent or taken to Lab for analysis 0 []  - Patient Transfer (multiple staff / Harrel Lemon Lift / Similar devices) 0 []  - Simple Staple / Suture removal (25 or less) 0 []  - Complex Staple / Suture removal (26 or more) 0 []  - Hypo / Hyperglycemic Management (close monitor of Blood Glucose) 0 []  - Ankle / Brachial Index (ABI) - do not check if billed separately 0 X - Vital Signs 1 5 Has the patient been seen at the hospital within the last three years: Yes Total Score: 60 Level Of Care: New/Established - Level 2 Electronic Signature(s) Signed: 03/09/2016 5:56:28 PM By: [name] Entered By: [name] on 03/09/2016 16:47:03 [name] ([id number]) -------------------------------------------------------------------------------- Encounter Discharge Information Details Patient Name: [name] Date of Service: 03/09/2016 3:15 PM Medical Record Patient Account Number: [id number] Number: Treating RN: [name] [birth year] (82 y.o. Other Clinician: Date of Birth/Sex: Female) Treating [name] Primary Care  Physician/Extender: [name] Physician: Referring Physician: Solon Allen in Treatment: 3 Encounter Discharge Information Items Discharge Pain Level: 0 Discharge Condition: Stable Ambulatory Status: Ambulatory Discharge Destination: Home Transportation: Private Auto Accompanied By: self Schedule Follow-up Appointment: Yes Medication Reconciliation completed and provided to Patient/Care Yes Rennie Hack: Provided on Clinical Summary of Care: 03/09/2016 Form Type Recipient Paper Patient BM Electronic Signature(s) Signed: 03/09/2016 3:52:27 PM By: Ruthine Dose Entered By: Ruthine Dose on 03/09/2016 15:52:27 Karen, Velna Allen ([id number]) -------------------------------------------------------------------------------- Lower Extremity Assessment Details Patient Name: [name] Date of Service: 03/09/2016 3:15 PM Medical Record Patient Account Number: [id number] Number: Treating RN: [name] 05-12-1926 (82 y.o. Other Clinician: Date of Birth/Sex: Female) Treating [name] Primary Care Physician/Extender: [name] Physician: Referring Physician: Solon Allen in Treatment: 3 Vascular Assessment Pulses: Posterior Tibial Dorsalis Pedis Palpable: [Right:Yes] Extremity colors, hair growth, and conditions: Extremity Color: [Right:Hyperpigmented] Temperature of Extremity: [Right:Warm] Capillary Refill: [Right:< 3 seconds] Electronic Signature(s) Signed: 03/09/2016 5:56:28 PM By: [name] Entered By: [name] on 03/09/2016 15:34:00 Bowermaster, Velna Allen ([id number]) -------------------------------------------------------------------------------- Multi Wound Chart Details Patient Name: [name] Date of Service: 03/09/2016 3:15 PM Medical Record Patient Account Number: [id number] Number: Treating RN: [name] 11-10-1925 (82 y.o. Other Clinician: Date of Birth/Sex: Female)  Treating [name] Primary Care Physician/Extender: [name] Physician: Referring Physician: Solon Allen in Treatment: 3 Vital Signs Height(in): 63 Pulse(bpm): 76 Weight(lbs): 130 Blood Pressure 157/58 (mmHg): Body Mass Index(BMI): 23 Temperature(F): 97.6 Respiratory Rate 20 (breaths/min): Photos: [1:No Photos] [N/A:N/A] Wound Location: [1:Right, Posterior Lower Leg] [N/A:N/A] Wounding Event: [1:Trauma] [N/A:N/A] Primary Etiology: [1:Trauma, Other] [N/A:N/A] Date Acquired: [1:01/27/2016] [N/A:N/A] Weeks of Treatment: [1:3] [N/A:N/A] Wound Status: [1:Healed - Epithelialized] [N/A:N/A] Measurements L x W x D 0x0x0 [N/A:N/A] (cm) Area (cm) : [1:0] [N/A:N/A] Volume (cm) : [1:0] [N/A:N/A] % Reduction in Area: [1:100.00%] [N/A:N/A] % Reduction  in Volume: 100.00% [N/A:N/A] Classification: [1:Partial Thickness] [N/A:N/A] Periwound Skin Texture: No Abnormalities Noted [N/A:N/A] Periwound Skin [1:No Abnormalities Noted] [N/A:N/A] Moisture: Periwound Skin Color: No Abnormalities Noted [N/A:N/A] Tenderness on [1:No] [N/A:N/A] Treatment Notes Electronic Signature(s) Signed: 03/09/2016 5:56:28 PM By: Gerre Scull, Velna Allen ([id number]) Entered By: [name] on 03/09/2016 15:41:40 [name] ([id number]) -------------------------------------------------------------------------------- [place] Details Patient Name: [name] Date of Service: 03/09/2016 3:15 PM Medical Record Patient Account Number: [id number] Number: Treating RN: [name] [birth year] (82 y.o. Other Clinician: Date of Birth/Sex: Female) Treating [name] Primary Care Physician/Extender: [name] Physician: Referring Physician: Solon Allen in Treatment: 3 Active Inactive Electronic Signature(s) Signed: 03/09/2016 5:56:28 PM By: [name] Entered By: [name] on 03/09/2016  15:41:34 Pietro, Velna Allen ([id number]) -------------------------------------------------------------------------------- Pain Assessment Details Patient Name: [name] Date of Service: 03/09/2016 3:15 PM Medical Record Patient Account Number: [id number] Number: Treating RN: [name] 16-Sep-1926 (82 y.o. Other Clinician: Date of Birth/Sex: Female) Treating [name] Primary Care Physician/Extender: [name] Physician: Referring Physician: Solon Allen in Treatment: 3 Active Problems Location of Pain Severity and Description of Pain Patient Has Paino No Site Locations Pain Management and Medication Current Pain Management: Electronic Signature(s) Signed: 03/09/2016 5:56:28 PM By: [name] Entered By: [name] on 03/09/2016 15:31:11 [name] ([id number]) -------------------------------------------------------------------------------- Patient/Caregiver Education Details Patient Name: [name] Date of Service: 03/09/2016 3:15 PM Medical Record Patient Account Number: [id number] Number: Treating RN: [name] Aug 29, 1926 (82 y.o. Other Clinician: Date of Birth/Gender: Female) Treating [name] Primary Care Physician/Extender: [name] Physician: Suella Grove in Treatment: 3 Referring Physician: Deborra Medina Education Assessment Education Provided To: Patient Education Topics Provided Wound/Skin Impairment: Handouts: Other: Wear compression stockings. Call us if you need anything or if you have any questions. Methods: Demonstration, Explain/Verbal Responses: State content correctly Electronic Signature(s) Signed: 03/09/2016 5:56:28 PM By: [name] Entered By: [name] on 03/09/2016 15:44:54 Lehrmann, Velna Allen ([id number]) -------------------------------------------------------------------------------- Wound Assessment Details Patient Name: [name] Date of Service: 03/09/2016 3:15 PM Medical Record Patient Account Number: [id number] Number: Treating RN: [name] 24-Feb-1926 (82 y.o. Other Clinician: Date of Birth/Sex: Female) Treating [name] Primary Care Physician/Extender: [name] Physician: Referring Physician: Solon Allen in Treatment: 3 Wound Status Wound Number: 1 Primary Etiology: Trauma, Other Wound Location: Right, Posterior Lower Leg Wound Status: Healed - Epithelialized Wounding Event: Trauma Date Acquired: 01/27/2016 Weeks Of Treatment: 3 Clustered Wound: No Photos Photo Uploaded By: [name] on 03/09/2016 17:55:53 Wound Measurements Length: (cm) 0 % Reduction Width: (cm) 0 % Reduction Depth: (cm) 0 Area: (cm) 0 Volume: (cm) 0 in Area: 100% in Volume: 100% Wound Description Classification: Partial Thickness Periwound Skin Texture Texture Color No Abnormalities Noted: No No Abnormalities Noted: No Moisture No Abnormalities Noted: No Hodder, Kacie B. ([id number]) Electronic Signature(s) Signed: 03/09/2016 5:56:28 PM By: [name] Entered By: [name] on 03/09/2016 15:41:10 [name] ([id number]) -------------------------------------------------------------------------------- [facility] Details Patient Name: [name] Date of Service: 03/09/2016 3:15 PM Medical Record Patient Account Number: [id number] Number: Treating RN: [name] 01-Oct-1925 (82 y.o. Other Clinician: Date of Birth/Sex: Female) Treating [name] Primary Care Physician/Extender: [name] Physician: Referring Physician: Solon Allen in Treatment: 3 Vital Signs Time Taken: 15:31 Temperature (F): 97.6 Height (in): 63 Pulse (bpm): 76 Weight (lbs): 130 Respiratory Rate (breaths/min): 20 Body Mass Index (BMI): 23 Blood Pressure (mmHg): 157/58 Reference Range: 80 -  120 mg / dl Electronic  Signature(s) Signed: 03/09/2016 5:56:28 PM By: [name] Entered By: [name] on 03/09/2016 15:33:10

## 2016-03-24 NOTE — Progress Notes (Signed)
Katrina, Allen ([id number]) Visit Report for 03/02/2016 Arrival Information Details Patient Name: [name], [name] Date of Service: 03/02/2016 2:15 PM Medical Record Patient Account Number: [id number] Number: Treating RN: Ahmed Prima 28-Nov-1925 (82 y.o. Other Clinician: Date of Birth/Sex: Female) Treating ROBSON, MICHAEL Primary Care Physician/Extender: [place] Physician: Referring Physician: Solon Augusta in Treatment: 2 Visit Information History Since Last Visit All ordered tests and consults were completed: No Patient Arrived: Ambulatory Added or deleted any medications: No Arrival Time: 14:42 Any new allergies or adverse reactions: No Accompanied By: self Had a fall or experienced change in No Transfer Assistance: None activities of daily living that may affect Patient Identification Verified: Yes risk of falls: Secondary Verification Process Yes Signs or symptoms of abuse/neglect since last No Completed: visito Patient Requires Transmission- No Hospitalized since last visit: No Based Precautions: Pain Present Now: No Patient Has Alerts: Yes Patient Alerts: Patient on Blood Thinner Plavix Electronic Signature(s) Signed: 03/02/2016 5:18:41 PM By: Alric Quan Entered By: Alric Quan on 03/02/2016 14:42:34 Belmonte, [name] ([id number]) -------------------------------------------------------------------------------- Encounter Discharge Information Details Patient Name: [name] Date of Service: 03/02/2016 2:15 PM Medical Record Patient Account Number: [id number] Number: Treating RN: Ahmed Prima August 14, 1926 (82 y.o. Other Clinician: Date of Birth/Sex: Female) Treating ROBSON, MICHAEL Primary Care Physician/Extender: [place] Physician: Referring Physician: Solon Augusta in Treatment: 2 Encounter Discharge Information Items Discharge Pain Level: 0 Discharge Condition:  Stable Ambulatory Status: Ambulatory Discharge Destination: Home Private Transportation: Auto Accompanied By: self Schedule Follow-up Appointment: Yes Medication Reconciliation completed and Yes provided to Patient/Care [place]: Clinical Summary of Care: Electronic Signature(s) Signed: 03/02/2016 5:18:41 PM By: Alric Quan Entered By: Alric Quan on 03/02/2016 15:28:02 [name] ([id number]) -------------------------------------------------------------------------------- Lower Extremity Assessment Details Patient Name: [name] Date of Service: 03/02/2016 2:15 PM Medical Record Patient Account Number: [id number] Number: Treating RN: Ahmed Prima 27-Jul-1926 (82 y.o. Other Clinician: Date of Birth/Sex: Female) Treating ROBSON, MICHAEL Primary Care Physician/Extender: [place] Physician: Referring Physician: Solon Augusta in Treatment: 2 Edema Assessment Assessed: [Left: No] [Right: No] E[Left: dema] [Right: :] Calf Left: Right: Point of Measurement: 32 cm From Medial Instep cm 34.4 cm Ankle Left: Right: Point of Measurement: 9 cm From Medial Instep cm 22.2 cm Vascular Assessment Pulses: Posterior Tibial Dorsalis Pedis Palpable: [Right:Yes] Extremity colors, hair growth, and conditions: Extremity Color: [Right:Hyperpigmented] Temperature of Extremity: [Right:Warm] Capillary Refill: [Right:< 3 seconds] Electronic Signature(s) Signed: 03/02/2016 5:18:41 PM By: Alric Quan Entered By: Alric Quan on 03/02/2016 14:52:37 Vanzanten, [name] ([id number]) -------------------------------------------------------------------------------- Multi Wound Chart Details Patient Name: [name] Date of Service: 03/02/2016 2:15 PM Medical Record Patient Account Number: [id number] Number: Treating RN: Ahmed Prima 10-17-1925 (82 y.o. Other Clinician: Date of Birth/Sex: Female) Treating ROBSON,  MICHAEL Primary Care Physician/Extender: [place] Physician: Referring Physician: Solon Augusta in Treatment: 2 Vital Signs Height(in): 63 Pulse(bpm): 76 Weight(lbs): 130 Blood Pressure 145/48 (mmHg): Body Mass Index(BMI): 23 Temperature(F): 98.2 Respiratory Rate 20 (breaths/min): Photos: [1:No Photos] [N/A:N/A] Wound Location: [1:Right Lower Leg - Posterior] [N/A:N/A] Wounding Event: [1:Trauma] [N/A:N/A] Primary Etiology: [1:Trauma, Other] [N/A:N/A] Comorbid History: [1:Chronic Obstructive Pulmonary Disease (COPD), Arrhythmia, Coronary Artery Disease, Hypertension] [N/A:N/A] Date Acquired: [1:01/27/2016] [N/A:N/A] Weeks of Treatment: [1:2] [N/A:N/A] Wound Status: [1:Open] [N/A:N/A] Measurements L x W x D 0.1x0.1x0.1 [N/A:N/A] (cm) Area (cm) : [1:0.008] [N/A:N/A] Volume (cm) : [1:0.001] [N/A:N/A] % Reduction in Area: [1:99.00%] [N/A:N/A] % Reduction in Volume: 98.70% [N/A:N/A] Classification: [1:Partial Thickness] [N/A:N/A] Exudate  Amount: [1:None Present] [N/A:N/A] Wound Margin: [1:Thickened] [N/A:N/A] Granulation Amount: [1:None Present (0%)] [N/A:N/A] Necrotic Amount: [1:Large (67-100%)] [N/A:N/A] Necrotic Tissue: [1:Eschar] [N/A:N/A] Exposed Structures: [1:Fascia: No Fat: No] [N/A:N/A] Tendon: No Muscle: No Joint: No Bone: No Limited to Skin Breakdown Epithelialization: None N/A N/A Periwound Skin Texture: Edema: Yes N/A N/A Periwound Skin Moist: Yes N/A N/A Moisture: Periwound Skin Color: Erythema: Yes N/A N/A Erythema Location: Circumferential N/A N/A Temperature: No Abnormality N/A N/A Tenderness on Yes N/A N/A Palpation: Wound Preparation: Ulcer Cleansing: N/A N/A Rinsed/Irrigated with Saline Topical Anesthetic Applied: Other: lidocaine 4% Treatment Notes Electronic Signature(s) Signed: 03/02/2016 5:18:41 PM By: Alric Quan Entered By: Alric Quan on 03/02/2016 15:14:11 [name]  ([id number]) -------------------------------------------------------------------------------- Multi-Disciplinary Care Plan Details Patient Name: [name] Date of Service: 03/02/2016 2:15 PM Medical Record Patient Account Number: [id number] Number: Treating RN: Ahmed Prima [birth year] (82 y.o. Other Clinician: Date of Birth/Sex: Female) Treating ROBSON, MICHAEL Primary Care Physician/Extender: [place] Physician: Referring Physician: Solon Augusta in Treatment: 2 Active Inactive Orientation to the Wound Care Program Nursing Diagnoses: Knowledge deficit related to the wound healing center program Goals: Patient/caregiver will verbalize understanding of the [facility] Date Initiated: 02/17/2016 Goal Status: Active Interventions: Provide education on orientation to the wound center Notes: Pain, Acute or Chronic Nursing Diagnoses: Pain, acute or chronic: actual or potential Potential alteration in comfort, pain Goals: Patient will verbalize adequate pain control and receive pain control interventions during procedures as needed Date Initiated: 02/17/2016 Goal Status: Active Interventions: Assess comfort goal upon admission Complete pain assessment as per visit requirements Notes: Wound/Skin Impairment Blevens, Jazzalyn B. ([id number]) Nursing Diagnoses: Impaired tissue integrity Goals: Ulcer/skin breakdown will have a volume reduction of 30% by week 4 Date Initiated: 02/17/2016 Goal Status: Active Ulcer/skin breakdown will have a volume reduction of 50% by week 8 Date Initiated: 02/17/2016 Goal Status: Active Ulcer/skin breakdown will have a volume reduction of 80% by week 12 Date Initiated: 02/17/2016 Goal Status: Active Interventions: Assess patient/caregiver ability to obtain necessary supplies Assess ulceration(s) every visit Notes: Electronic Signature(s) Signed: 03/02/2016 5:18:41 PM By: Alric Quan Entered  By: Alric Quan on 03/02/2016 15:14:05 Basey, [name] ([id number]) -------------------------------------------------------------------------------- Pain Assessment Details Patient Name: [name] Date of Service: 03/02/2016 2:15 PM Medical Record Patient Account Number: [id number] Number: Treating RN: Ahmed Prima 20-Oct-1925 (82 y.o. Other Clinician: Date of Birth/Sex: Female) Treating ROBSON, MICHAEL Primary Care Physician/Extender: [place] [facility] Physician: Referring Physician: Solon Augusta in Treatment: 2 Active Problems Location of Pain Severity and Description of Pain Patient Has Paino No Site Locations Pain Management and Medication Current Pain Management: Electronic Signature(s) Signed: 03/02/2016 5:18:41 PM By: Alric Quan Entered By: Alric Quan on 03/02/2016 14:42:39 Howat, [name] ([id number]) -------------------------------------------------------------------------------- Patient/Caregiver Education Details Patient Name: [name] Date of Service: 03/02/2016 2:15 PM Medical Record Patient Account Number: [id number] Number: Treating RN: Ahmed Prima [birth year] (82 y.o. Other Clinician: Date of Birth/Gender: Female) Treating ROBSON, MICHAEL Primary Care Physician/Extender: [place] Physician: Suella [place] in Treatment: 2 Referring Physician: Deborra Medina Education Assessment Education Provided To: Patient Education Topics Provided Wound/Skin Impairment: Handouts: Other: change dressing as ordered Methods: Demonstration, Explain/Verbal Responses: State content correctly Electronic Signature(s) Signed: 03/02/2016 5:18:41 PM By: Alric Quan Entered By: Alric Quan on 03/02/2016 15:28:15 Scarantino, [name] ([id number]) -------------------------------------------------------------------------------- Wound Assessment Details Patient Name: [name] Date of  Service: 03/02/2016 2:15 PM Medical Record Patient Account Number: [id number] Number: Treating RN: Ahmed Prima 1926-08-26 (  82 y.o. Other Clinician: Date of Birth/Sex: Female) Treating ROBSON, MICHAEL Primary Care Physician/Extender: [place] Physician: Referring Physician: Solon Augusta in Treatment: 2 Wound Status Wound Number: 1 Primary Trauma, Other Etiology: Wound Location: Right Lower Leg - Posterior Wound Open Wounding Event: Trauma Status: Date Acquired: 01/27/2016 Comorbid Chronic Obstructive Pulmonary Disease Weeks Of Treatment: 2 History: (COPD), Arrhythmia, Coronary Artery Clustered Wound: No Disease, Hypertension Photos Photo Uploaded By: Alric Quan on 03/03/2016 07:55:02 Wound Measurements Length: (cm) 0.1 Width: (cm) 0.1 Depth: (cm) 0.1 Area: (cm) 0.008 Volume: (cm) 0.001 % Reduction in Area: 99% % Reduction in Volume: 98.7% Epithelialization: None Tunneling: No Undermining: No Wound Description Classification: Partial Thickness Wound Margin: Thickened Exudate Amount: None Present Foul Odor After Cleansing: No Wound Bed Granulation Amount: None Present (0%) Exposed Structure Necrotic Amount: Large (67-100%) Fascia Exposed: No Necrotic Quality: Eschar Fat Layer Exposed: No Batiz, Milena B. ([id number]) Tendon Exposed: No Muscle Exposed: No Joint Exposed: No Bone Exposed: No Limited to Skin Breakdown Periwound Skin Texture Texture Color No Abnormalities Noted: No No Abnormalities Noted: No Localized Edema: Yes Erythema: Yes Erythema Location: Circumferential Moisture No Abnormalities Noted: No Temperature / Pain Moist: Yes Temperature: No Abnormality Tenderness on Palpation: Yes Wound Preparation Ulcer Cleansing: Rinsed/Irrigated with Saline Topical Anesthetic Applied: Other: lidocaine 4%, Electronic Signature(s) Signed: 03/02/2016 5:18:41 PM By: Alric Quan Entered By: Alric Quan on  03/02/2016 15:13:53 [name] ([id number]) -------------------------------------------------------------------------------- Vitals Details Patient Name: [name] Date of Service: 03/02/2016 2:15 PM Medical Record Patient Account Number: [id number] Number: Treating RN: Ahmed Prima 1925/11/19 (82 y.o. Other Clinician: Date of Birth/Sex: Female) Treating ROBSON, MICHAEL Primary Care Physician/Extender: [place] Physician: Referring Physician: Solon Augusta in Treatment: 2 Vital Signs Time Taken: 14:42 Temperature (F): 98.2 Height (in): 63 Pulse (bpm): 76 Weight (lbs): 130 Respiratory Rate (breaths/min): 20 Body Mass Index (BMI): 23 Blood Pressure (mmHg): 145/48 Reference Range: 80 - 120 mg / dl Electronic Signature(s) Signed: 03/02/2016 5:18:41 PM By: Alric Quan Entered By: Alric Quan on 03/02/2016 14:44:50

## 2016-03-24 NOTE — Progress Notes (Signed)
[name] ([id number]) Visit Report for 03/02/2016 Chief Complaint Document Details Patient Name: [name], [name] Date of Service: 03/02/2016 2:15 PM Medical Record Patient Account Number: [id number] Number: Treating RN: [name] 30-Dec-1925 (82 y.o. Other Clinician: Date of Birth/Sex: Female) Treating [name] Primary Care Physician/Extender: [name] Physician: Referring Physician: Solon Allen in Treatment: 2 Information Obtained from: Patient Chief Complaint The patient is here for review of wounds on the posterior right leg which were initially trauma getting out of her car Electronic Signature(s) Signed: 03/24/2016 7:33:08 AM By: Katrina Ham MD Entered By: [name] on 03/02/2016 15:50:04 [name] ([id number]) -------------------------------------------------------------------------------- Debridement Details Patient Name: [name] Date of Service: 03/02/2016 2:15 PM Medical Record Patient Account Number: [id number] Number: Treating RN: [name] 25-Apr-1926 (82 y.o. Other Clinician: Date of Birth/Sex: Female) Treating [name] Primary Care Physician/Extender: [name] Physician: Referring Physician: Solon Allen in Treatment: 2 Debridement Performed for Wound #1 Right,Posterior Lower Leg Assessment: Performed By: Physician Katrina Dillon, MD Debridement: Debridement Pre-procedure Yes Verification/Time Out Taken: Start Time: 15:13 Pain Control: Lidocaine 4% Topical Solution Level: Skin/Subcutaneous Tissue Total Area Debrided (L x 0.1 (cm) x 0.1 (cm) = 0.01 (cm) W): Tissue and other Viable, Non-Viable, Exudate, Fibrin/Slough, Subcutaneous material debrided: Instrument: Curette Bleeding: Minimum Hemostasis Achieved: Pressure End Time: 15:15 Procedural Pain: 0 Post Procedural Pain: 0 Response to Treatment: Procedure was tolerated well Post  Debridement Measurements of Total Wound Length: (cm) 0.2 Width: (cm) 0.2 Depth: (cm) 0.1 Volume: (cm) 0.003 Post Procedure Diagnosis Same as Pre-procedure Electronic Signature(s) Signed: 03/02/2016 5:18:41 PM By: [name] Signed: 03/24/2016 7:33:08 AM By: Katrina Ham MD Entered By: [name] on 03/02/2016 15:49:46 [name], [name] ([id number]) [name], [name] ([id number]) -------------------------------------------------------------------------------- HPI Details Patient Name: [name] Date of Service: 03/02/2016 2:15 PM Medical Record Patient Account Number: [id number] Number: Treating RN: [name] October 08, 1925 (82 y.o. Other Clinician: Date of Birth/Sex: Female) Treating [name] Primary Care Physician/Extender: [name] Physician: Referring Physician: Solon Allen in Treatment: 2 History of Present Illness HPI Description: 02/17/16; this is a reasonably healthy woman who was on both Plavix and ASA. 2-1/2 weeks ago she traumatized her posterior right leg while getting out of the car. She is not exactly certain how this happened. She apparently was left with a hematoma and some open wounds. She was seen in an urgent clear 3 weeks ago given topical antibiotics and doxycycline.. She does not have a history of diabetes or prior wound history. She is here for my review of this. 02/24/16 No major changed still small open areas x2. hematoma underneath.is unchaged 03/02/16; both small wounds are debridement. The larger inferior one is actually fully epithelialized. Small open area remains on the top wound. I think this will close. Patient traveling to [place] next week, she wants to come and see [place] before she leaves so we will see her again in a week. Electronic Signature(s) Signed: 03/24/2016 7:33:08 AM By: Katrina Ham MD Entered By: [name] on 03/02/2016 15:50:57 [name]  ([id number]) -------------------------------------------------------------------------------- Physical Exam Details Patient Name: [name] Date of Service: 03/02/2016 2:15 PM Medical Record Patient Account Number: [id number] Number: Treating RN: [name] [birth year] (82 y.o. Other Clinician: Date of Birth/Sex: Female) Treating [name] Primary Care Physician/Extender: [name] Physician: Referring Physician: Solon Allen in Treatment: 2 Notes Wound exam; the 2 small areas again had surface eschar and nonviable  subcutaneous tissue debridement. No evidence of surrounding infection. The inferior wound I think is fully epithelialized, still a small open area superiorly no evidence of surrounding infection Electronic Signature(s) Signed: 03/24/2016 7:33:08 AM By: Katrina Ham MD Entered By: [name] on 03/02/2016 15:52:44 [name] ([id number]) -------------------------------------------------------------------------------- Physician Orders Details Patient Name: [name] Date of Service: 03/02/2016 2:15 PM Medical Record Patient Account Number: [id number] Number: Treating RN: [name] 1926/05/18 (82 y.o. Other Clinician: Date of Birth/Sex: Female) Treating Ranald [name] Primary Care Physician/Extender: [name] Physician: Referring Physician: Solon Allen in Treatment: 2 Verbal / Phone Orders: Yes Clinician: Carolyne Allen, [name] Read Back and Verified: Yes Diagnosis Coding ICD-10 Coding Code Description [phone] Puncture wound without foreign body, right lower leg, initial encounter Wound Cleansing Wound #1 Right,Posterior Lower Leg o Clean wound with Normal Saline. Anesthetic Wound #1 Right,Posterior Lower Leg o Topical Lidocaine 4% cream applied to wound bed prior to debridement Primary Wound Dressing Wound #1 Right,Posterior Lower Leg o Other: -  Polymem Secondary Dressing o Boardered Foam Dressing Dressing Change Frequency Wound #1 Right,Posterior Lower Leg o Three times weekly Follow-up Appointments Wound #1 Right,Posterior Lower Leg o Return Appointment in 1 week. Edema Control Wound #1 Right,Posterior Lower Leg o Elevate legs to the level of the heart and pump ankles as often as possible Additional Orders / Instructions Wound #1 Right,Posterior Lower Leg Allen, Katrina B. ([id number]) o Increase protein intake. Electronic Signature(s) Signed: 03/02/2016 5:18:41 PM By: [name] Signed: 03/24/2016 7:33:08 AM By: Katrina Ham MD Entered By: [name] on 03/02/2016 15:27:20 [name] ([id number]) -------------------------------------------------------------------------------- Problem List Details Patient Name: [name] Date of Service: 03/02/2016 2:15 PM Medical Record Patient Account Number: [id number] Number: Treating RN: [name] 13-Oct-1925 (82 y.o. Other Clinician: Date of Birth/Sex: Female) Treating Azaya Goedde Primary Care Physician/Extender: [name] Physician: Referring Physician: Solon Allen in Treatment: 2 Active Problems ICD-10 Encounter Code Description Active Date Diagnosis S81.831A Puncture wound without foreign body, right lower leg, 02/17/2016 Yes initial encounter Inactive Problems Resolved Problems Electronic Signature(s) Signed: 03/24/2016 7:33:08 AM By: Katrina Ham MD Entered By: [name] on 03/02/2016 15:49:27 [place], [name] ([id number]) -------------------------------------------------------------------------------- Progress Note Details Patient Name: [name] Date of Service: 03/02/2016 2:15 PM Medical Record Patient Account Number: [id number] Number: Treating RN: [name] Nov 18, 1925 (82 y.o. Other Clinician: Date of Birth/Sex: Female) Treating Roxi Hlavaty,  Amron Guerrette Primary Care Physician/Extender: [name] Physician: Referring Physician: Solon Allen in Treatment: 2 Subjective Chief Complaint Information obtained from Patient The patient is here for review of wounds on the posterior right leg which were initially trauma getting out of her car History of Present Illness (HPI) 02/17/16; this is a reasonably healthy woman who was on both Plavix and ASA. 2-1/2 weeks ago she traumatized her posterior right leg while getting out of the car. She is not exactly certain how this happened. She apparently was left with a hematoma and some open wounds. She was seen in an urgent clear 3 weeks ago given topical antibiotics and doxycycline.. She does not have a history of diabetes or prior wound history. She is here for my review of this. 02/24/16 No major changed still small open areas x2. hematoma underneath.is unchaged 03/02/16; both small wounds are debridement. The larger inferior one is actually fully epithelialized. Small open area remains on the top wound. I think this will close. Patient traveling to [place] next week, she wants to come and see  [place] before she leaves so we will see her again in a week. Objective Constitutional Vitals Time Taken: 2:42 PM, Height: 63 in, Weight: 130 lbs, BMI: 23, Temperature: 98.2 F, Pulse: 76 bpm, Respiratory Rate: 20 breaths/min, Blood Pressure: 145/48 mmHg. Integumentary (Hair, Skin) Wound #1 status is Open. Original cause of wound was Trauma. The wound is located on the Right,Posterior Lower Leg. The wound measures 0.1cm length x 0.1cm width x 0.1cm depth; 0.008cm^2 area and 0.001cm^3 volume. The wound is limited to skin breakdown. There is no tunneling or undermining noted. There is a none present amount of drainage noted. The wound margin is thickened. There is no granulation within the wound bed. There is a large (67-100%) amount of necrotic tissue within the wound bed including Eschar. The  periwound skin appearance exhibited: Localized Edema, Moist, Erythema. The [facility], Katrina B. ([id number]) surrounding wound skin color is noted with erythema which is circumferential. Periwound temperature was noted as No Abnormality. The periwound has tenderness on palpation. Assessment Active Problems ICD-10 [id number] - Puncture wound without foreign body, right lower leg, initial encounter Procedures Wound #1 Wound #1 is a Trauma, Other located on the Right,Posterior Lower Leg . There was a Skin/Subcutaneous Tissue Debridement [id number]) debridement with total area of 0.01 sq cm performed by Katrina Dillon, MD. with the following instrument(s): Curette to remove Viable and Non-Viable tissue/material including Exudate, Fibrin/Slough, and Subcutaneous after achieving pain control using Lidocaine 4% Topical Solution. A time out was conducted prior to the start of the procedure. A Minimum amount of bleeding was controlled with Pressure. The procedure was tolerated well with a pain level of 0 throughout and a pain level of 0 following the procedure. Post Debridement Measurements: 0.2cm length x 0.2cm width x 0.1cm depth; 0.003cm^3 volume. Post procedure Diagnosis Wound #1: Same as Pre-Procedure Plan Wound Cleansing: Wound #1 Right,Posterior Lower Leg: Clean wound with Normal Saline. Anesthetic: Wound #1 Right,Posterior Lower Leg: Topical Lidocaine 4% cream applied to wound bed prior to debridement Primary Wound Dressing: Wound #1 Right,Posterior Lower Leg: Other: - Polymem Secondary Dressing: Boardered Foam Dressing Allen, Katrina B. ([id number]) Dressing Change Frequency: Wound #1 Right,Posterior Lower Leg: Three times weekly Follow-up Appointments: Wound #1 Right,Posterior Lower Leg: Return Appointment in 1 week. Edema Control: Wound #1 Right,Posterior Lower Leg: Elevate legs to the level of the heart and pump ankles as often as possible Additional Orders /  Instructions: Wound #1 Right,Posterior Lower Leg: Increase protein intake. #1 we applied Polymen foam, I think this should be resolved next week Electronic Signature(s) Signed: 03/24/2016 7:33:08 AM By: Katrina Ham MD Entered By: [name] on 03/02/2016 15:53:43 Allen, [name] ([id number]) -------------------------------------------------------------------------------- SuperBill Details Patient Name: [name] Date of Service: 03/02/2016 Medical Record Patient Account Number: [id number] Number: Treating RN: [name] 11-Nov-1925 (82 y.o. Other Clinician: Date of Birth/Sex: Female) Treating Ronisha Herringshaw Primary Care Physician/Extender: [name] Physician: Suella Grove in Treatment: 2 Referring Physician: Deborra Medina Diagnosis Coding ICD-10 Codes Code Description [phone] Puncture wound without foreign body, right lower leg, initial encounter Facility Procedures CPT4 Code Description: [id number] - DEB SUBQ TISSUE 20 SQ CM/< ICD-10 Description Diagnosis S81.831A Puncture wound without foreign body, right lower le Modifier: g, initial enc Quantity: 1 ounter Physician Procedures CPT4 Code Description: [id number] 11042 - WC PHYS SUBQ TISS 20 SQ CM ICD-10 Description Diagnosis S81.831A Puncture wound without foreign body, right lower le Modifier: g, initial enc Quantity: 1 Therapist, nutritional) Signed: 03/24/2016 7:33:08 AM By: Katrina Ham MD Entered  By: [name] on 03/02/2016 15:54:08

## 2016-04-06 ENCOUNTER — Ambulatory Visit: Payer: Medicare Other

## 2016-04-06 ENCOUNTER — Other Ambulatory Visit: Payer: Self-pay

## 2016-04-06 DIAGNOSIS — I6523 Occlusion and stenosis of bilateral carotid arteries: Secondary | ICD-10-CM

## 2016-04-06 NOTE — Telephone Encounter (Signed)
Please advise, last OV patient reported not taking, thanks

## 2016-04-07 ENCOUNTER — Ambulatory Visit (INDEPENDENT_AMBULATORY_CARE_PROVIDER_SITE_OTHER): Payer: Medicare Other

## 2016-04-07 ENCOUNTER — Telehealth: Payer: Self-pay | Admitting: Internal Medicine

## 2016-04-07 DIAGNOSIS — H6123 Impacted cerumen, bilateral: Secondary | ICD-10-CM

## 2016-04-07 DIAGNOSIS — H6121 Impacted cerumen, right ear: Secondary | ICD-10-CM | POA: Diagnosis not present

## 2016-04-07 MED ORDER — TEMAZEPAM 15 MG PO CAPS: ORAL_CAPSULE | ORAL | Status: DC

## 2016-04-07 NOTE — Telephone Encounter (Signed)
Thanks, will do today.

## 2016-04-07 NOTE — Progress Notes (Signed)
Patient came in for a bilateral ear irrigation.  Spoke with Dr. Derrel Nip, she advised in Telephone note to irrigate both ears on 7/20.  Inspected ears, left ear had small wax noted, right ear was impacted with wax.  Irrigated left and small amount removed.  Right ear irrigated with warm water and peroxide mixture and removed copious large amount of intact, corkscrew like yellow wax.  Patient immediately stated wow I can already hear better.  Ear canals fully visualized prior to patient leaving.    Please advise?  Please advise as Dr. Derrel Nip is out of the office this afternoon, thanks.

## 2016-04-07 NOTE — Telephone Encounter (Signed)
Please check with patient. Will refill if needed

## 2016-04-07 NOTE — Telephone Encounter (Signed)
Please schedule patient for a bilateral era irrigation for cerumen impaction

## 2016-04-11 ENCOUNTER — Ambulatory Visit: Payer: Medicare Other | Admitting: Cardiovascular Disease

## 2016-04-11 ENCOUNTER — Telehealth: Payer: Self-pay | Admitting: Internal Medicine

## 2016-04-11 DIAGNOSIS — K5289 Other specified noninfective gastroenteritis and colitis: Secondary | ICD-10-CM

## 2016-04-11 NOTE — Progress Notes (Signed)
  I have reviewed the above information and agree with above.   Tachina Spoonemore, MD 

## 2016-04-11 NOTE — Telephone Encounter (Signed)
Spoke with the patient, she will see how she feels in the next hours or so, she has limited her diet already and the last few hours have been okay, no stools.  I explained that she can come have the test done and she will if it persists.  Appreciative of the call, thanks

## 2016-04-11 NOTE — Telephone Encounter (Signed)
Patient has history of c dificile colitis and sent a message through her sister [name] that she has been having loose stools for the last 24 hours.  If she would like to have her stool tested,  I will order the stool test and she can come in to have that done  In the meantime,  She should simplify diet:  jello and chicken broth, ginger ale,  Water,  gatorade   If diarrhea improves, she can add bananas,  Dry toast,  Rice and applesauce (no fat, no dairy,  No roughage)  .

## 2016-04-12 ENCOUNTER — Telehealth: Payer: Self-pay | Admitting: Cardiovascular Disease

## 2016-04-12 ENCOUNTER — Encounter: Payer: Self-pay | Admitting: Cardiovascular Disease

## 2016-04-12 NOTE — Telephone Encounter (Signed)
Pt calling stating she was to come yesterday.  But she was sick, she is calling to get the results on the carotid that she did not to long ago  Pt c/o Shortness Of Breath: STAT if SOB developed within the last 24 hours or pt is noticeably SOB on the phone  1. Are you currently SOB (can you hear that pt is SOB on the phone)? No   2. How long have you been experiencing SOB? Couple months of and on   3. Are you SOB when sitting or when up moving around? In motion   4. Are you currently experiencing any other symptoms?  Thinks it may be the humidly, but is a bit concerned about this.   This was the only other reason for her to come in  And see [place] Placed patient on wait list

## 2016-04-12 NOTE — Telephone Encounter (Signed)
Spoke w/ pt.  She reports that she cancelled her appt yesterday 2/2 na/v, diarrhea. She reports SOB, off & on,not necessarily w/ exertion.  She does not weigh herself daily.  After some discussion, pt reports that she has been out of her Spiriva inhaler for some time. She thought she called pulmonology.  Will forward to their attention.

## 2016-04-12 NOTE — Telephone Encounter (Signed)
Spoke with pt and she states she has been having SOB and hasn't used the Spiriva in months. Pt last OV was 04/2015. Informed pt she would need to be seen before we can prescribe anything different. appt scheduled for pt to see DS. Nothing further needed.

## 2016-04-13 ENCOUNTER — Ambulatory Visit (INDEPENDENT_AMBULATORY_CARE_PROVIDER_SITE_OTHER): Payer: Medicare Other | Admitting: Pulmonary Disease

## 2016-04-13 ENCOUNTER — Encounter: Payer: Self-pay | Admitting: Pulmonary Disease

## 2016-04-13 VITALS — BP 130/62 | HR 67 | Ht 63.5 in | Wt 131.2 lb

## 2016-04-13 DIAGNOSIS — I6523 Occlusion and stenosis of bilateral carotid arteries: Secondary | ICD-10-CM | POA: Diagnosis not present

## 2016-04-13 DIAGNOSIS — J449 Chronic obstructive pulmonary disease, unspecified: Secondary | ICD-10-CM

## 2016-04-13 MED ORDER — IPRATROPIUM-ALBUTEROL 20-100 MCG/ACT IN AERS: 1.0000 | INHALATION_SPRAY | Freq: Four times a day (QID) | RESPIRATORY_TRACT | Status: DC | PRN

## 2016-04-13 MED ORDER — IPRATROPIUM-ALBUTEROL 20-100 MCG/ACT IN AERS: 1.0000 | INHALATION_SPRAY | Freq: Four times a day (QID) | RESPIRATORY_TRACT | 5 refills | Status: DC

## 2016-04-13 NOTE — Progress Notes (Signed)
PROBLEMS: Former smoker Mild COPD - very well compensated  INTERVAL HISTORY: No major events  SUBJ: This is a routine re-evaluation. She has no new complaints. She is using Spiriva "as needed" and rarely. Denies CP, fever, purulent sputum, hemoptysis, LE edema and calf tenderness. She continues to golf twice a week  OBJ: Vitals:   04/13/16 0936  BP: 130/62  Pulse: 67  SpO2: 95%  Weight: 131 lb 3.2 oz (59.5 kg)  Height: 5' 3.5" (1.613 m)   NAD HEENT WNL No JVD Diminished BS, no wheezes RRR s M NABS, soft No LE edema No focal neurological deficits   DATA: No new  IMPRESSION: Mild COPD - well compensated. She is nnot using Spiriva as prescribed but this does not seem to be adversely affecting her.  PLAN: DC Spiriva Trial of Combivent respimat to be used as needed (essentially as she is presently using Spiriva) ROV 6 months or PRN  Merton Border, MD PCCM service Mobile [phone] Pager [phone] 04/13/2016      Wilhelmina Mcardle, MD [facility]/CCM

## 2016-04-14 ENCOUNTER — Telehealth: Payer: Self-pay | Admitting: *Deleted

## 2016-04-14 MED ORDER — LACTULOSE 20 GM/30ML PO SOLN: ORAL | 3 refills | Status: DC

## 2016-04-14 NOTE — Telephone Encounter (Signed)
Spoke with the patient, she will try the lactulose, thanks

## 2016-04-14 NOTE — Telephone Encounter (Signed)
She sounds constipation.  I will call in lactulose to take ,  30 ccs every 4 hours until she has a large BM.

## 2016-04-14 NOTE — Telephone Encounter (Signed)
Patient would like to see Dr.Tullo only for constant stomach pain. Pt contact [phone]

## 2016-04-14 NOTE — Telephone Encounter (Signed)
Patient stated that she's feeling a lot better and did not need to be worked in.

## 2016-04-14 NOTE — Telephone Encounter (Signed)
Spoke with the patient [name] few days ago had called regarding diarrhea and now her symptoms have changed.  Now her stomach aches, low in the middle pain, 8 intermittent in strength, small BM this am was solid, taking metamucil to help keep bowels going.   Eating habits: not much intake Today partial piece of toast and yogurt, she is trying to increase her water intake. No other symptoms, no fevers, chills.   Please advise, thanks

## 2016-04-18 NOTE — Progress Notes (Signed)
Care was provided under my supervision. I agree with the note.  Thersa Salt DO

## 2016-06-01 DIAGNOSIS — Z23 Encounter for immunization: Secondary | ICD-10-CM | POA: Diagnosis not present

## 2016-06-10 ENCOUNTER — Other Ambulatory Visit: Payer: Self-pay | Admitting: Internal Medicine

## 2016-06-15 DIAGNOSIS — H43813 Vitreous degeneration, bilateral: Secondary | ICD-10-CM | POA: Diagnosis not present

## 2016-06-16 DIAGNOSIS — Z85828 Personal history of other malignant neoplasm of skin: Secondary | ICD-10-CM | POA: Diagnosis not present

## 2016-06-16 DIAGNOSIS — C44222 Squamous cell carcinoma of skin of right ear and external auricular canal: Secondary | ICD-10-CM | POA: Diagnosis not present

## 2016-06-16 DIAGNOSIS — C44729 Squamous cell carcinoma of skin of left lower limb, including hip: Secondary | ICD-10-CM | POA: Diagnosis not present

## 2016-06-16 DIAGNOSIS — X32XXXA Exposure to sunlight, initial encounter: Secondary | ICD-10-CM | POA: Diagnosis not present

## 2016-06-16 DIAGNOSIS — D485 Neoplasm of uncertain behavior of skin: Secondary | ICD-10-CM | POA: Diagnosis not present

## 2016-06-16 DIAGNOSIS — L570 Actinic keratosis: Secondary | ICD-10-CM | POA: Diagnosis not present

## 2016-06-17 ENCOUNTER — Ambulatory Visit (INDEPENDENT_AMBULATORY_CARE_PROVIDER_SITE_OTHER): Payer: Medicare Other | Admitting: Cardiovascular Disease

## 2016-06-17 ENCOUNTER — Encounter: Payer: Self-pay | Admitting: Cardiovascular Disease

## 2016-06-17 ENCOUNTER — Other Ambulatory Visit: Payer: Self-pay | Admitting: Internal Medicine

## 2016-06-17 VITALS — BP 134/60 | HR 65 | Ht 63.5 in | Wt 133.0 lb

## 2016-06-17 DIAGNOSIS — R9431 Abnormal electrocardiogram [ECG] [EKG]: Secondary | ICD-10-CM | POA: Insufficient documentation

## 2016-06-17 DIAGNOSIS — I10 Essential (primary) hypertension: Secondary | ICD-10-CM

## 2016-06-17 DIAGNOSIS — I2510 Atherosclerotic heart disease of native coronary artery without angina pectoris: Secondary | ICD-10-CM | POA: Diagnosis not present

## 2016-06-17 DIAGNOSIS — I6523 Occlusion and stenosis of bilateral carotid arteries: Secondary | ICD-10-CM

## 2016-06-17 DIAGNOSIS — R001 Bradycardia, unspecified: Secondary | ICD-10-CM | POA: Diagnosis not present

## 2016-06-17 DIAGNOSIS — R0602 Shortness of breath: Secondary | ICD-10-CM

## 2016-06-17 DIAGNOSIS — Z951 Presence of aortocoronary bypass graft: Secondary | ICD-10-CM

## 2016-06-17 MED ORDER — NITROGLYCERIN 0.4 MG SL SUBL: 0.4000 mg | SUBLINGUAL_TABLET | SUBLINGUAL | 6 refills | Status: DC | PRN

## 2016-06-17 NOTE — Patient Instructions (Addendum)
Medication Instructions:   No medication changes made  Labwork:  No new labs needed  Testing/Procedures:  We will schedule a lexiscan myoview for new EKG changes and shortness of breath   [place]  Your caregiver has ordered a Stress Test with nuclear imaging. The purpose of this test is to evaluate the blood supply to your heart muscle. This procedure is referred to as a "Non-Invasive Stress Test." This is because other than having an IV started in your vein, nothing is inserted or "invades" your body. Cardiac stress tests are done to find areas of poor blood flow to the heart by determining the extent of coronary artery disease (CAD). Some patients exercise on a treadmill, which naturally increases the blood flow to your heart, while others who are  unable to walk on a treadmill due to physical limitations have a pharmacologic/chemical stress agent called Lexiscan . This medicine will mimic walking on a treadmill by temporarily increasing your coronary blood flow.   Please note: these test may take anywhere between 2-4 hours to complete  PLEASE REPORT TO [facility] AT THE FIRST DESK WILL DIRECT YOU WHERE TO GO  Date of Procedure:______Wednesday, October 11_____  Arrival Time for Procedure:____8:15 am_______________   How to prepare for your Myoview test:  1. Do not eat or drink after midnight 2. No caffeine for 24 hours prior to test 3. No smoking 24 hours prior to test. 4. Your medication may be taken with water.  If your doctor stopped a medication because of this test, do not take that medication. 5. Ladies, please do not wear dresses.  Skirts or pants are appropriate. Please wear a short sleeve shirt. 6. No perfume, cologne or lotion.   Follow-Up: It was a pleasure seeing you in the office today. Please call us if you have new issues that need to be addressed before your next appt.  [phone]  Your physician wants you to follow-up  in: 6 months.  You will receive a reminder letter in the mail two months in advance. If you don't receive a letter, please call our office to schedule the follow-up appointment.  If you need a refill on your cardiac medications before your next appointment, please call your pharmacy.    Cardiac Nuclear Scanning A cardiac nuclear scan is used to check your heart for problems, such as the following:  A portion of the heart is not getting enough blood.  Part of the heart muscle has died, which happens with a heart attack.  The heart wall is not working normally.  In this test, a radioactive dye (tracer) is injected into your bloodstream. After the tracer has traveled to your heart, a scanning device is used to measure how much of the tracer is absorbed by or distributed to various areas of your heart. LET [facility] CARE PROVIDER KNOW ABOUT:  Any allergies you have.  All medicines you are taking, including vitamins, herbs, eye drops, creams, and over-the-counter medicines.  Previous problems you or members of your family have had with the use of anesthetics.  Any blood disorders you have.  Previous surgeries you have had.  Medical conditions you have.  RISKS AND COMPLICATIONS Generally, this is a safe procedure. However, as with any procedure, problems can occur. Possible problems include:   Serious chest pain.  Rapid heartbeat.  Sensation of warmth in your chest. This usually passes quickly. BEFORE THE PROCEDURE Ask your health care provider about changing or stopping  your regular medicines. PROCEDURE This procedure is usually done at a hospital and takes 2-4 hours.  An IV tube is inserted into one of your veins.  Your health care provider will inject a small amount of radioactive tracer through the tube.  You will then wait for 20-40 minutes while the tracer travels through your bloodstream.  You will lie down on an exam table so images of your heart can be taken.  Images will be taken for about 15-20 minutes.  You will exercise on a treadmill or stationary bike. While you exercise, your heart activity will be monitored with an electrocardiogram (ECG), and your blood pressure will be checked.  If you are unable to exercise, you may be given a medicine to make your heart beat faster.  When blood flow to your heart has peaked, tracer will again be injected through the IV tube.  After 20-40 minutes, you will get back on the exam table and have more images taken of your heart.  When the procedure is over, your IV tube will be removed. AFTER THE PROCEDURE  You will likely be able to leave shortly after the test. Unless your health care provider tells you otherwise, you may return to your normal schedule, including diet, activities, and medicines.  Make sure you find out how and when you will get your test results.   This information is not intended to replace advice given to you by your health care provider. Make sure you discuss any questions you have with your health care provider.   Document Released: 09/30/2004 Document Revised: 09/10/2013 Document Reviewed: 08/14/2013 Elsevier Interactive Patient Education [employer].

## 2016-06-17 NOTE — Progress Notes (Signed)
Cardiology Office Note  Date:  06/17/2016   ID:  [name], DOB [birth year], MRN [id number]  PCP:  [name], MD   Chief Complaint  Patient presents with  . other    6 month f/u no complaints today. Meds reviewed verbally with pt.    HPI:  [name] is a pleasant 82 year old woman with a history of CAD, bypass surgery in 1999, long history of smoking for 40 years who quit in 1985, COPD, chronic renal insufficiency, hypertension, hyperlipidemia, 40-50% bilateral carotid arterial disease who presents for routine followup of her coronary artery disease Last stress test September 2015  In follow-up today she reports that she is doing well, some increased shortness of breath particularly with stairs Active, no regular exercise program Continues to play golf  Previous anginal symptoms prior to bypass surgery discussed with her Had shortness of breath prior to bypass surgery Lab work reviewed with her, LDL 72   EKG from today's visit reviewed showing normal sinus rhythm with rate 65 bpm, diffuse T-wave abnormality precordial leads, new T-wave abnormality in inferior leads  Other past medical history reviewed On previous office visits, reported having left-sided chest pain and shoulder pain Declined stress testing or catheterization  at that time.  On last clinic visit had chest pain, took aspirin and nitroglycerin, after several hours symptoms resolved.   She has not had any intervention since her bypass surgery. No cardiac catheterizations, no stent placement.  C. difficile in January 2015. She was in the hospital for several days.  Last stress test was October 2011 and September 2015  Carotid ultrasound showing 40-59% disease   PMH:   has a past medical history of C. difficile colitis; CAD (coronary artery disease); Carotid artery disease ([place]); Diffuse cystic mastopathy; Diverticulitis; Dizziness; Gait abnormality; GERD (gastroesophageal reflux disease);  History of migraines; CABG; Hyperlipidemia; Hypertension; Indigestion; Kidney stones; Rheumatic fever; Sinus bradycardia; SOB (shortness of breath); and Syncopal episodes.  PSH:    Past Surgical History:  Procedure Laterality Date  . APPENDECTOMY  1950  . BREAST BIOPSY Right 1994  . BREAST CYST ASPIRATION     multiple BIL  . CATARACT EXTRACTION Right 2009  . CATARACT EXTRACTION Left 2011  . COLONOSCOPY  [id number]   Dr. Jamal Collin  . CORONARY ARTERY BYPASS GRAFT  1999  . esophagus stretched    . TONSILLECTOMY  1939  . TOTAL ABDOMINAL HYSTERECTOMY  1968   due to metrorrhagia    Current Outpatient Prescriptions  Medication Sig Dispense Refill  . ALPRAZolam (XANAX) 0.25 MG tablet 1 to 2 tablets at bedtime as needed for insomnia 60 tablet 3  . amLODipine (NORVASC) 2.5 MG tablet TAKE 1 TABLET EVERY DAY 90 tablet 1  . aspirin 81 MG tablet Take 81 mg by mouth daily.      . clopidogrel (PLAVIX) 75 MG tablet TAKE 1 TABLET EVERY DAY 90 tablet 1  . esomeprazole (NEXIUM) 40 MG capsule TAKE 1 CAPSULE DAILY BEFORE BREAKFAST 90 capsule 1  . Ipratropium-Albuterol (COMBIVENT RESPIMAT) 20-100 MCG/ACT AERS respimat Inhale 1 puff into the lungs every 6 (six) hours. 1 Inhaler 5  . isosorbide mononitrate (IMDUR) 30 MG 24 hr tablet Take 30 mg by mouth as needed. Reported on 02/10/2016    . Lactulose 20 GM/30ML SOLN 30 ml every 4 hours until constipation is relieved 236 mL 3  . losartan (COZAAR) 100 MG tablet TAKE 1 TABLET EVERY DAY 90 tablet 2  . nitroGLYCERIN (NITROSTAT) 0.4 MG SL tablet Place 1  tablet (0.4 mg total) under the tongue every 5 (five) minutes as needed for chest pain. 25 tablet 6  . Psyllium-Calcium (METAMUCIL PLUS CALCIUM) CAPS Take 2 capsules by mouth daily as needed.     . simvastatin (ZOCOR) 40 MG tablet TAKE ONE TABLET AT BEDTIME 90 tablet 3  . temazepam (RESTORIL) 15 MG capsule TAKE ONE CAPSULE AT BEDTIME IF NEEDED FOR SLEEP. MAY REPEAT IN ONE HOUR. 60 capsule 2  . tioconazole (VAGISTAT)  6.5 % vaginal ointment Place 1 applicator vaginally once. 8 g 0   Current Facility-Administered Medications  Medication Dose Route Frequency Provider Last Rate Last Dose  . Ipratropium-Albuterol (COMBIVENT) respimat 1 puff  1 puff Inhalation Q6H PRN Wilhelmina Mcardle, MD         Allergies:   Codeine   Social History:  The patient  reports that she quit smoking about 32 years ago. Her smoking use included Cigarettes. She has a 20.00 pack-year smoking history. She has never used smokeless tobacco. She reports that she drinks alcohol. She reports that she does not use drugs.   Family History:   family history includes Heart disease in her father and mother.    Review of Systems: Review of Systems  Constitutional: Negative.   Respiratory: Positive for shortness of breath.   Cardiovascular: Positive for chest pain.  Gastrointestinal: Negative.   Musculoskeletal: Negative.   Neurological: Negative.   Psychiatric/Behavioral: Negative.   All other systems reviewed and are negative.    PHYSICAL EXAM: VS:  BP 134/60 (BP Location: Left Arm, Patient Position: Sitting, Cuff Size: Normal)   Pulse 65   Ht 5' 3.5" (1.613 m)   Wt 133 lb (60.3 kg)   BMI 23.19 kg/m  , BMI Body mass index is 23.19 kg/m. GEN: Well nourished, well developed, in no acute distress  HEENT: normal  Neck: no JVD, carotid bruits, or masses Cardiac: RRR; no murmurs, rubs, or gallops,no edema  Respiratory:  clear to auscultation bilaterally, normal work of breathing GI: soft, nontender, nondistended, + BS MS: no deformity or atrophy  Skin: warm and dry, no rash Neuro:  Strength and sensation are intact Psych: euthymic mood, full affect    Recent Labs: 11/09/2015: ALT 15; BUN 20; Creatinine, Ser 1.18; Potassium 4.5; Sodium 141    Lipid Panel Lab Results  Component Value Date   CHOL 142 02/09/2015   HDL 48.40 02/09/2015   LDLCALC 66 02/09/2015   TRIG 136.0 02/09/2015      Wt Readings from Last 3  Encounters:  06/17/16 133 lb (60.3 kg)  04/13/16 131 lb 3.2 oz (59.5 kg)  02/10/16 131 lb 4 oz (59.5 kg)       ASSESSMENT AND PLAN:  Essential hypertension - Plan: EKG 12-Lead, NM Myocar Multi W/Spect W/Wall Motion / EF Blood pressure is well controlled on today's visit. No changes made to the medications.  Coronary artery disease involving native coronary artery of native heart without angina pectoris - Plan: EKG 12-Lead, NM Myocar Multi W/Spect W/Wall Motion / EF Previous anginal symptoms of shortness of breath, worse recently  EKG abnormalities - Plan: NM Myocar Multi W/Spect W/Wall Motion / EF New T-wave abnormality noted concerning for graft disease to the RCA Stress test ordered  SOB (shortness of breath) - Plan: NM Myocar Multi W/Spect W/Wall Motion / EF Worsening shortness of breath concerning for ischemia Long discussion with her concerning various treatment options Given new T-wave abnormality in inferior leads, stress test has been ordered, pharmacologic study  History of bypass surgery Previous coronary anatomy discussed with her, bypass grafting details, long discussion concerning new EKG abnormality   Total encounter time more than 25 minutes  Greater than 50% was spent in counseling and coordination of care with the patient   Disposition:   F/U  6 months   Orders Placed This Encounter  Procedures  . NM Myocar Multi W/Spect W/Wall Motion / EF  . EKG 12-Lead     Signed, Esmond Plants, M.D., Ph.D. 06/17/2016  [place], [place]

## 2016-06-23 DIAGNOSIS — M71321 Other bursal cyst, right elbow: Secondary | ICD-10-CM | POA: Diagnosis not present

## 2016-06-29 ENCOUNTER — Ambulatory Visit
Admission: RE | Admit: 2016-06-29 | Discharge: 2016-06-29 | Disposition: A | Discharge status: Home / Self Care | Payer: Medicare Other | Source: Ambulatory Visit | Attending: Cardiovascular Disease | Admitting: Cardiovascular Disease

## 2016-06-29 DIAGNOSIS — R0602 Shortness of breath: Secondary | ICD-10-CM

## 2016-06-29 DIAGNOSIS — I2510 Atherosclerotic heart disease of native coronary artery without angina pectoris: Secondary | ICD-10-CM | POA: Diagnosis not present

## 2016-06-29 DIAGNOSIS — Z951 Presence of aortocoronary bypass graft: Secondary | ICD-10-CM

## 2016-06-29 DIAGNOSIS — R9431 Abnormal electrocardiogram [ECG] [EKG]: Secondary | ICD-10-CM

## 2016-06-29 DIAGNOSIS — I10 Essential (primary) hypertension: Secondary | ICD-10-CM | POA: Diagnosis not present

## 2016-06-29 LAB — NM MYOCAR MULTI W/SPECT W/WALL MOTION / EF
CHL CUP NUCLEAR SDS: 0
CHL CUP NUCLEAR SRS: 1
CHL CUP STRESS STAGE 1 SPEED: 0 mph
CHL CUP STRESS STAGE 2 GRADE: 0 %
CHL CUP STRESS STAGE 2 HR: 74 {beats}/min
CHL CUP STRESS STAGE 3 SPEED: 0 mph
CHL CUP STRESS STAGE 4 HR: 94 {beats}/min
CHL CUP STRESS STAGE 4 SBP: 170 mmHg
CHL CUP STRESS STAGE 4 SPEED: 0 mph
CSEPEW: 1 METS
CSEPPMHR: 80 %
LV sys vol: 26 mL
LVDIAVOL: 65 mL (ref 46–106)
NUC STRESS TID: 0.7
Peak HR: 107 {beats}/min
Percent HR: 84 %
Rest HR: 78 {beats}/min
SSS: 0
Stage 1 Grade: 0 %
Stage 1 HR: 74 {beats}/min
Stage 2 Speed: 0 mph
Stage 3 Grade: 0 %
Stage 3 HR: 107 {beats}/min
Stage 4 DBP: 70 mmHg
Stage 4 Grade: 0 %

## 2016-06-29 MED ORDER — TECHNETIUM TC 99M TETROFOSMIN IV KIT
13.0000 | PACK | Freq: Once | INTRAVENOUS | Status: AC | PRN
  Administered 2016-06-29: 12.91 via INTRAVENOUS

## 2016-06-29 MED ORDER — REGADENOSON 0.4 MG/5ML IV SOLN
0.4000 mg | Freq: Once | INTRAVENOUS | Status: AC
  Administered 2016-06-29: 0.4 mg via INTRAVENOUS
  Filled 2016-06-29: qty 5

## 2016-06-29 MED ORDER — TECHNETIUM TC 99M TETROFOSMIN IV KIT
33.0000 | PACK | Freq: Once | INTRAVENOUS | Status: AC | PRN
  Administered 2016-06-29: 31.8 via INTRAVENOUS

## 2016-07-07 DIAGNOSIS — D0472 Carcinoma in situ of skin of left lower limb, including hip: Secondary | ICD-10-CM | POA: Diagnosis not present

## 2016-07-07 DIAGNOSIS — C44729 Squamous cell carcinoma of skin of left lower limb, including hip: Secondary | ICD-10-CM | POA: Diagnosis not present

## 2016-07-12 ENCOUNTER — Other Ambulatory Visit: Payer: Self-pay | Admitting: Internal Medicine

## 2016-07-25 DIAGNOSIS — C44729 Squamous cell carcinoma of skin of left lower limb, including hip: Secondary | ICD-10-CM | POA: Diagnosis not present

## 2016-08-01 DIAGNOSIS — C44320 Squamous cell carcinoma of skin of unspecified parts of face: Secondary | ICD-10-CM | POA: Insufficient documentation

## 2016-08-01 DIAGNOSIS — L814 Other melanin hyperpigmentation: Secondary | ICD-10-CM | POA: Diagnosis not present

## 2016-08-01 DIAGNOSIS — L578 Other skin changes due to chronic exposure to nonionizing radiation: Secondary | ICD-10-CM | POA: Diagnosis not present

## 2016-08-01 DIAGNOSIS — L908 Other atrophic disorders of skin: Secondary | ICD-10-CM | POA: Diagnosis not present

## 2016-08-01 DIAGNOSIS — C44321 Squamous cell carcinoma of skin of nose: Secondary | ICD-10-CM | POA: Diagnosis not present

## 2016-08-01 DIAGNOSIS — Z85828 Personal history of other malignant neoplasm of skin: Secondary | ICD-10-CM | POA: Diagnosis not present

## 2016-08-01 HISTORY — DX: Squamous cell carcinoma of skin of unspecified parts of face: C44.320

## 2016-08-17 ENCOUNTER — Other Ambulatory Visit: Payer: Self-pay

## 2016-08-17 ENCOUNTER — Encounter: Payer: Self-pay | Admitting: Internal Medicine

## 2016-08-17 ENCOUNTER — Ambulatory Visit (INDEPENDENT_AMBULATORY_CARE_PROVIDER_SITE_OTHER): Payer: Medicare Other | Admitting: Internal Medicine

## 2016-08-17 VITALS — BP 140/74 | HR 70 | Temp 97.8°F | Resp 12 | Ht 63.0 in | Wt 132.0 lb

## 2016-08-17 DIAGNOSIS — R1030 Lower abdominal pain, unspecified: Secondary | ICD-10-CM

## 2016-08-17 DIAGNOSIS — C44729 Squamous cell carcinoma of skin of left lower limb, including hip: Secondary | ICD-10-CM | POA: Diagnosis not present

## 2016-08-17 DIAGNOSIS — R194 Change in bowel habit: Secondary | ICD-10-CM

## 2016-08-17 DIAGNOSIS — K921 Melena: Secondary | ICD-10-CM

## 2016-08-17 DIAGNOSIS — I6523 Occlusion and stenosis of bilateral carotid arteries: Secondary | ICD-10-CM | POA: Diagnosis not present

## 2016-08-17 DIAGNOSIS — R198 Other specified symptoms and signs involving the digestive system and abdomen: Secondary | ICD-10-CM

## 2016-08-17 LAB — COMPREHENSIVE METABOLIC PANEL
ALT: 14 U/L (ref 0–35)
AST: 18 U/L (ref 0–37)
Albumin: 4.3 g/dL (ref 3.5–5.2)
Alkaline Phosphatase: 61 U/L (ref 39–117)
BILIRUBIN TOTAL: 0.6 mg/dL (ref 0.2–1.2)
BUN: 21 mg/dL (ref 6–23)
CALCIUM: 9.1 mg/dL (ref 8.4–10.5)
CO2: 28 meq/L (ref 19–32)
CREATININE: 1.29 mg/dL — AB (ref 0.40–1.20)
Chloride: 106 mEq/L (ref 96–112)
GFR: 41.45 mL/min — ABNORMAL LOW (ref >60.00)
Glucose, Bld: 102 mg/dL — ABNORMAL HIGH (ref 70–99)
Potassium: 4.7 mEq/L (ref 3.5–5.1)
SODIUM: 142 meq/L (ref 135–145)
Total Protein: 6.4 g/dL (ref 6.0–8.3)

## 2016-08-17 LAB — POCT URINALYSIS DIPSTICK
Bilirubin, UA: NEGATIVE
GLUCOSE UA: NEGATIVE
NITRITE UA: NEGATIVE
PH UA: 5
PROTEIN UA: 30
SPEC GRAV UA: 1.015
UROBILINOGEN UA: 0.2

## 2016-08-17 LAB — IBC PANEL
Iron: 183 ug/dL — ABNORMAL HIGH (ref 42–145)
SATURATION RATIOS: 65 % — AB (ref 20.0–50.0)
Transferrin: 201 mg/dL — ABNORMAL LOW (ref 212.0–360.0)

## 2016-08-17 LAB — URINALYSIS, ROUTINE W REFLEX MICROSCOPIC
BILIRUBIN URINE: NEGATIVE
Hgb urine dipstick: NEGATIVE
Ketones, ur: NEGATIVE
Leukocytes, UA: NEGATIVE
Nitrite: NEGATIVE
PH: 5.5 (ref 5.0–8.0)
SPECIFIC GRAVITY, URINE: 1.02 (ref 1.000–1.030)
TOTAL PROTEIN, URINE-UPE24: 30 — AB
UROBILINOGEN UA: 0.2 (ref 0.0–1.0)
Urine Glucose: NEGATIVE

## 2016-08-17 LAB — CBC WITH DIFFERENTIAL/PLATELET
BASOS PCT: 0.8 % (ref 0.0–3.0)
Basophils Absolute: 0 10*3/uL (ref 0.0–0.1)
Eosinophils Absolute: 0.2 10*3/uL (ref 0.0–0.7)
Eosinophils Relative: 2.8 % (ref 0.0–5.0)
HEMATOCRIT: 39.3 % (ref 36.0–46.0)
Hemoglobin: 13.3 g/dL (ref 12.0–15.0)
LYMPHS PCT: 22 % (ref 12.0–46.0)
Lymphs Abs: 1.3 10*3/uL (ref 0.7–4.0)
MCHC: 34 g/dL (ref 30.0–36.0)
MCV: 97.9 fl (ref 78.0–100.0)
MONOS PCT: 10.3 % (ref 3.0–12.0)
Monocytes Absolute: 0.6 10*3/uL (ref 0.1–1.0)
NEUTROS ABS: 3.7 10*3/uL (ref 1.4–7.7)
Neutrophils Relative %: 64.1 % (ref 43.0–77.0)
PLATELETS: 172 10*3/uL (ref 150.0–400.0)
RBC: 4.01 Mil/uL (ref 3.87–5.11)
RDW: 13.2 % (ref 11.5–15.5)
WBC: 5.8 10*3/uL (ref 4.0–10.5)

## 2016-08-17 LAB — SEDIMENTATION RATE: Sed Rate: 6 mm/hr (ref 0–30)

## 2016-08-17 LAB — FERRITIN: Ferritin: 78.2 ng/mL (ref 10.0–291.0)

## 2016-08-17 MED ORDER — FLUCONAZOLE 150 MG PO TABS: 150.0000 mg | ORAL_TABLET | Freq: Every day | ORAL | 0 refills | Status: DC

## 2016-08-17 NOTE — Progress Notes (Signed)
Pre-visit discussion using our clinic review tool. No additional management support is needed unless otherwise documented below in the visit note.  

## 2016-08-17 NOTE — Telephone Encounter (Signed)
LastOV was today 11/19, but last refill was in 2016.  Please advise, thanks

## 2016-08-17 NOTE — Progress Notes (Signed)
Subjective:  Patient ID: [name], female    DOB: 1926-06-07  Age: 82 y.o. MRN: [id number]  CC: The primary encounter diagnosis was Hematochezia. Diagnoses of Lower abdominal pain, Change in bowel movement, and Iron overload were also pertinent to this visit.  HPI [name] presents for EVALUATION  Of intermittent black tarry stools  Accompanied by constipation x 1 month  No weight loss, abdominal pain. Or BRBPR. Last colonoscopy 6 years ago  Incomplete due to active diverticulitis    Coincided with initiation of chemotherapy for skin Cancer  Outpatient Medications Prior to Visit  Medication Sig Dispense Refill  . amLODipine (NORVASC) 2.5 MG tablet TAKE 1 TABLET EVERY DAY 90 tablet 1  . aspirin 81 MG tablet Take 81 mg by mouth daily.      . clopidogrel (PLAVIX) 75 MG tablet TAKE 1 TABLET BY MOUTH ONE TIME DAILY 90 tablet 1  . esomeprazole (NEXIUM) 40 MG capsule TAKE 1 CAPSULE DAILY BEFORE BREAKFAST 90 capsule 1  . Ipratropium-Albuterol (COMBIVENT RESPIMAT) 20-100 MCG/ACT AERS respimat Inhale 1 puff into the lungs every 6 (six) hours. 1 Inhaler 5  . isosorbide mononitrate (IMDUR) 30 MG 24 hr tablet Take 30 mg by mouth as needed. Reported on 02/10/2016    . Lactulose 20 GM/30ML SOLN 30 ml every 4 hours until constipation is relieved 236 mL 3  . losartan (COZAAR) 100 MG tablet TAKE 1 TABLET EVERY DAY 90 tablet 1  . nitroGLYCERIN (NITROSTAT) 0.4 MG SL tablet Place 1 tablet (0.4 mg total) under the tongue every 5 (five) minutes as needed for chest pain. 25 tablet 6  . Psyllium-Calcium (METAMUCIL PLUS CALCIUM) CAPS Take 2 capsules by mouth daily as needed.     . simvastatin (ZOCOR) 40 MG tablet TAKE ONE TABLET AT BEDTIME 90 tablet 3  . temazepam (RESTORIL) 15 MG capsule TAKE ONE CAPSULE AT BEDTIME IF NEEDED FOR SLEEP. MAY REPEAT IN ONE HOUR. 60 capsule 2  . tioconazole (VAGISTAT) 6.5 % vaginal ointment Place 1 applicator vaginally once. 8 g 0  . ALPRAZolam (XANAX) 0.25 MG  tablet 1 to 2 tablets at bedtime as needed for insomnia 60 tablet 3   Facility-Administered Medications Prior to Visit  Medication Dose Route Frequency Provider Last Rate Last Dose  . Ipratropium-Albuterol (COMBIVENT) respimat 1 puff  1 puff Inhalation Q6H PRN Wilhelmina Mcardle, MD        Review of Systems;  Patient denies headache, fevers, malaise, unintentional weight loss, skin rash, eye pain, sinus congestion and sinus pain, sore throat, dysphagia,  hemoptysis , cough, dyspnea, wheezing, chest pain, palpitations, orthopnea, edema, abdominal pain, nausea,diarrhea, constipation, flank pain, dysuria, hematuria, urinary  Frequency, nocturia, numbness, tingling, seizures,  Focal weakness, Loss of consciousness,  Tremor, insomnia, depression, anxiety, and suicidal ideation.      Objective:  BP 140/74   Pulse 70   Temp 97.8 F (36.6 C) (Oral)   Resp 12   Ht '5\' 3"'  (1.6 m)   Wt 132 lb (59.9 kg)   SpO2 96%   BMI 23.38 kg/m   BP Readings from Last 3 Encounters:  08/17/16 140/74  06/17/16 134/60  04/13/16 130/62    Wt Readings from Last 3 Encounters:  08/17/16 132 lb (59.9 kg)  06/17/16 133 lb (60.3 kg)  04/13/16 131 lb 3.2 oz (59.5 kg)    General appearance: alert, cooperative and appears stated age Neck: no adenopathy, no carotid bruit, supple, symmetrical, trachea midline and thyroid not enlarged, symmetric, no tenderness/mass/nodules  Back: symmetric, no curvature. ROM normal. No CVA tenderness. Lungs: clear to auscultation bilaterally Heart: regular rate and rhythm, S1, S2 normal, no murmur, click, rub or gallop Abdomen: soft, non-tender; bowel sounds normal; no masses,  no organomegaly Rectal: stool brown, soft, heme negative  Pulses: 2+ and symmetric Skin: Skin color, texture, turgor normal. No rashes or lesions Lymph nodes: Cervical, supraclavicular, and axillary nodes normal.  No results found for: HGBA1C  Lab Results  Component Value Date   CREATININE 1.29 (H)  08/17/2016   CREATININE 1.18 11/09/2015   CREATININE 1.19 02/09/2015    Lab Results  Component Value Date   WBC 5.8 08/17/2016   HGB 13.3 08/17/2016   HCT 39.3 08/17/2016   PLT 172.0 08/17/2016   GLUCOSE 102 (H) 08/17/2016   CHOL 142 02/09/2015   TRIG 136.0 02/09/2015   HDL 48.40 02/09/2015   LDLDIRECT 72.0 11/09/2015   LDLCALC 66 02/09/2015   ALT 14 08/17/2016   AST 18 08/17/2016   NA 142 08/17/2016   K 4.7 08/17/2016   CL 106 08/17/2016   CREATININE 1.29 (H) 08/17/2016   BUN 21 08/17/2016   CO2 28 08/17/2016   TSH 1.44 02/09/2015   INR 1.0 06/17/2014   MICROALBUR 2.5 (H) 12/26/2011    Nm Myocar Multi W/spect W/wall Motion / Ef  Result Date: 06/29/2016 Pharmacological myocardial perfusion imaging study with no significant  ischemia Normal wall motion, EF estimated at 35% (depressed EF likely secondary to GI uptake artifact) No EKG changes concerning for ischemia at peak stress or in recovery. Low risk scan Signed, Esmond Plants, MD, Ph.D [facility]    Assessment & Plan:   Problem List Items Addressed This Visit    Change in bowel movement    Her report of blac tarry stools has not resulted in anemia ,  And her stool today was guaiac negative.       Iron overload    Her iron saturation was high but her ferritin was normal Without polycythemia,         Other Visit Diagnoses    Hematochezia    -  Primary   Relevant Orders   POC Hemoccult Bld/Stl (1-Cd Office Dx)   CBC with Differential/Platelet (Completed)   IBC panel (Completed)   Ferritin (Completed)   Comp Met (CMET) (Completed)   Lower abdominal pain       Relevant Orders   POCT urinalysis dipstick (Completed)   Urinalysis, Routine w reflex microscopic (not at [facility]) (Completed)   Urine culture (Completed)   Sedimentation rate (Completed)      I am having Ms. Homen start on fluconazole. I am also having her maintain her aspirin, METAMUCIL PLUS CALCIUM, esomeprazole, isosorbide mononitrate,  tioconazole, simvastatin, temazepam, Ipratropium-Albuterol, Lactulose, amLODipine, clopidogrel, nitroGLYCERIN, and losartan. We will continue to administer Ipratropium-Albuterol.  Meds ordered this encounter  Medications  . fluconazole (DIFLUCAN) 150 MG tablet    Sig: Take 1 tablet (150 mg total) by mouth daily.    Dispense:  2 tablet    Refill:  0    There are no discontinued medications.  Follow-up: No Follow-up on file.   [name], MD

## 2016-08-17 NOTE — Patient Instructions (Signed)
Your stool test today did not show any blood in it  I am running additional tests to rule out urinary tract infection and anemia   I am treating yoru vaginal itching with 2 days of Diflucan

## 2016-08-18 LAB — URINE CULTURE

## 2016-08-18 MED ORDER — ALPRAZOLAM 0.25 MG PO TABS: ORAL_TABLET | ORAL | 3 refills | Status: DC

## 2016-08-20 DIAGNOSIS — R198 Other specified symptoms and signs involving the digestive system and abdomen: Secondary | ICD-10-CM | POA: Insufficient documentation

## 2016-08-20 DIAGNOSIS — K5900 Constipation, unspecified: Secondary | ICD-10-CM | POA: Insufficient documentation

## 2016-08-20 NOTE — Assessment & Plan Note (Addendum)
Her iron saturation was high but her ferritin was normal Without polycythemia,

## 2016-08-20 NOTE — Assessment & Plan Note (Signed)
Her report of blac tarry stools has not resulted in anemia ,  And her stool today was guaiac negative.

## 2016-08-24 ENCOUNTER — Other Ambulatory Visit: Payer: Self-pay | Admitting: Internal Medicine

## 2016-08-26 ENCOUNTER — Other Ambulatory Visit: Payer: Self-pay

## 2016-09-26 ENCOUNTER — Other Ambulatory Visit (INDEPENDENT_AMBULATORY_CARE_PROVIDER_SITE_OTHER): Payer: Medicare Other

## 2016-09-26 ENCOUNTER — Encounter: Payer: Self-pay | Admitting: Internal Medicine

## 2016-09-26 ENCOUNTER — Other Ambulatory Visit: Payer: Self-pay | Admitting: Internal Medicine

## 2016-09-26 LAB — HEPATIC FUNCTION PANEL
ALT: 11 U/L (ref 0–35)
AST: 15 U/L (ref 0–37)
Albumin: 4.1 g/dL (ref 3.5–5.2)
Alkaline Phosphatase: 64 U/L (ref 39–117)
BILIRUBIN DIRECT: 0.2 mg/dL (ref 0.0–0.3)
TOTAL PROTEIN: 6.4 g/dL (ref 6.0–8.3)
Total Bilirubin: 0.8 mg/dL (ref 0.2–1.2)

## 2016-09-26 LAB — IBC PANEL
Iron: 160 ug/dL — ABNORMAL HIGH (ref 42–145)
SATURATION RATIOS: 51.9 % — AB (ref 20.0–50.0)
Transferrin: 220 mg/dL (ref 212.0–360.0)

## 2016-09-26 LAB — HEMOGLOBIN A1C: Hgb A1c MFr Bld: 5.8 % (ref 4.6–6.5)

## 2016-09-26 LAB — FERRITIN: Ferritin: 72.8 ng/mL (ref 10.0–291.0)

## 2016-09-29 DIAGNOSIS — D485 Neoplasm of uncertain behavior of skin: Secondary | ICD-10-CM | POA: Diagnosis not present

## 2016-09-29 DIAGNOSIS — L989 Disorder of the skin and subcutaneous tissue, unspecified: Secondary | ICD-10-CM | POA: Diagnosis not present

## 2016-10-11 ENCOUNTER — Other Ambulatory Visit: Payer: Self-pay | Admitting: Cardiovascular Disease

## 2016-10-18 ENCOUNTER — Other Ambulatory Visit: Payer: Self-pay

## 2016-10-18 DIAGNOSIS — Z1231 Encounter for screening mammogram for malignant neoplasm of breast: Secondary | ICD-10-CM

## 2016-10-20 ENCOUNTER — Telehealth: Payer: Self-pay | Admitting: Internal Medicine

## 2016-10-20 MED ORDER — PREDNISONE 10 MG PO TABS: ORAL_TABLET | ORAL | 0 refills | Status: DC

## 2016-10-20 NOTE — Telephone Encounter (Signed)
Patient advised of below and verbalized an understanding  

## 2016-10-20 NOTE — Telephone Encounter (Signed)
Reason for call:vertigo Symptoms: dizziness worse when walking, fine when l laying down, head feels better today, previous symptoms of cold allergies 10 days ago,  Duration Tuesday Medications: meclizine 25mg  OTC I advised patient to take blood pressure and heart rate with home monitor and call us back with readings  .  Any other suggestions please advise.

## 2016-10-20 NOTE — Telephone Encounter (Signed)
Prednisone taper called in to [facility], she can start it if symptoms persist.  (treats labrynthitis from inner ear)

## 2016-10-20 NOTE — Telephone Encounter (Signed)
Pt called and stated that she has had vertigo since Tuesday. She wanted to know if Dr. Derrel Nip could call something in. Please advise, thank you!@  Call pt@ [phone]  Pharmacy - [address] - [place], [address]

## 2016-11-03 ENCOUNTER — Ambulatory Visit: Payer: Medicare Other | Admitting: Pulmonary Disease

## 2016-11-03 ENCOUNTER — Ambulatory Visit: Payer: Medicare Other | Admitting: Internal Medicine

## 2016-11-08 ENCOUNTER — Ambulatory Visit (INDEPENDENT_AMBULATORY_CARE_PROVIDER_SITE_OTHER): Payer: Medicare Other

## 2016-11-08 VITALS — BP 128/64 | HR 61 | Temp 97.9°F | Resp 14 | Ht 62.5 in | Wt 131.8 lb

## 2016-11-08 DIAGNOSIS — Z0000 Encounter for general adult medical examination without abnormal findings: Secondary | ICD-10-CM

## 2016-11-08 NOTE — Patient Instructions (Addendum)
  [name] , Thank you for taking time to come for your Medicare Wellness Visit. I appreciate your ongoing commitment to your health goals. Please review the following plan we discussed and let me know if I can assist you in the future.   These are the goals we discussed: Goals    . Increase water intake          Stay hydrated and drink plenty of water, 4 cups.  Finish entire cup of water when taking medications.         This is a list of the screening recommended for you and due dates:  Health Maintenance  Topic Date Due  . Tetanus Vaccine  02/08/2023  . Flu Shot  Completed  . DEXA scan (bone density measurement)  Completed  . Pneumonia vaccines  Completed

## 2016-11-08 NOTE — Progress Notes (Signed)
Care was provided under my supervision. I agree with the management as indicated in the note.  Aleksey Newbern DO  

## 2016-11-08 NOTE — Progress Notes (Signed)
Subjective:   [name] is a 83 y.o. female who presents for Medicare Annual (Subsequent) preventive examination.  Review of Systems:  No ROS.  Medicare Wellness Visit.  Cardiac Risk Factors include: advanced age (>24men, >25 women);hypertension     Objective:     Vitals: BP 128/64 (BP Location: Left Arm, Patient Position: Sitting, Cuff Size: Normal)   Pulse 61   Temp 97.9 F (36.6 C) (Oral)   Resp 14   Ht 5' 2.5" (1.588 m)   Wt 131 lb 12.8 oz (59.8 kg)   SpO2 96%   BMI 23.72 kg/m   Body mass index is 23.72 kg/m.   Tobacco History  Smoking Status  . Former Smoker  . Packs/day: 0.50  . Years: 40.00  . Types: Cigarettes  . Quit date: 09/20/1983  Smokeless Tobacco  . Never Used     Counseling given: Not Answered   Past Medical History:  Diagnosis Date  . C. difficile colitis   . CAD (coronary artery disease)    a. Nuclear 10/11, not gated, no scar or ischemia, EF 68%, low risk study; b. [place] 06/18/14: no evidence of infarction or ischemia, no WMA, normalization of TWI in precordial leads, equivocal EKG changes EF 77%, low risk study  . Carotid artery disease ([place])    Doppler January, 2012, 40-59% bilateral, followup one year  . Diffuse cystic mastopathy   . Diverticulitis    last colonoscopy  incomplete Jan 2011  . Dizziness    stable now (Oct 2011) patient tells me question of TIA, we will obtain records  . Gait abnormality    Patient complains of "wobbly gait", December, 2013  . GERD (gastroesophageal reflux disease)   . History of migraines   . Hx of CABG    a. 3 vessel in 1999  . Hyperlipidemia   . Hypertension   . Indigestion    3 weeks, October 2011  . Kidney stones   . Rheumatic fever   . Sinus bradycardia    Mild, December, 2013  . SOB (shortness of breath)   . Syncopal episodes    after 18 holes of golf and increase hydrochlorothiazide, resolved    Past Surgical History:  Procedure Laterality Date  . APPENDECTOMY  1950    . BREAST BIOPSY Right 1994  . BREAST CYST ASPIRATION     multiple BIL  . CATARACT EXTRACTION Right 2009  . CATARACT EXTRACTION Left 2011  . COLONOSCOPY  [id number]   Dr. Jamal Collin  . CORONARY ARTERY BYPASS GRAFT  1999  . esophagus stretched    . TONSILLECTOMY  1939  . TOTAL ABDOMINAL HYSTERECTOMY  1968   due to metrorrhagia   Family History  Problem Relation Age of Onset  . Heart disease Mother   . Heart disease Father   . Cancer Neg Hx   . Drug abuse Neg Hx   . Breast cancer Neg Hx    History  Sexual Activity  . Sexual activity: No    Outpatient Encounter Prescriptions as of 11/08/2016  Medication Sig  . ALPRAZolam (XANAX) 0.25 MG tablet 1 to 2 tablets at bedtime as needed for insomnia  . amLODipine (NORVASC) 2.5 MG tablet TAKE 1 TABLET EVERY DAY  . aspirin 81 MG tablet Take 81 mg by mouth daily.    . clopidogrel (PLAVIX) 75 MG tablet TAKE 1 TABLET BY MOUTH ONE TIME DAILY  . esomeprazole (NEXIUM) 40 MG capsule TAKE 1 CAPSULE DAILY BEFORE BREAKFAST  . Ipratropium-Albuterol (COMBIVENT  RESPIMAT) 20-100 MCG/ACT AERS respimat Inhale 1 puff into the lungs every 6 (six) hours.  . Lactulose 20 GM/30ML SOLN 30 ml every 4 hours until constipation is relieved  . losartan (COZAAR) 100 MG tablet TAKE 1 TABLET EVERY DAY  . Misc Natural Products (CRANBERRY/PROBIOTIC PO) Take 1 tablet by mouth.  . nitroGLYCERIN (NITROSTAT) 0.4 MG SL tablet Place 1 tablet (0.4 mg total) under the tongue every 5 (five) minutes as needed for chest pain.  Marland Kitchen Psyllium-Calcium (METAMUCIL PLUS CALCIUM) CAPS Take 2 capsules by mouth daily as needed.   . simvastatin (ZOCOR) 40 MG tablet TAKE ONE TABLET AT BEDTIME  . tioconazole (VAGISTAT) 6.5 % vaginal ointment Place 1 applicator vaginally once.  . [DISCONTINUED] fluconazole (DIFLUCAN) 150 MG tablet Take 1 tablet (150 mg total) by mouth daily.  . [DISCONTINUED] isosorbide mononitrate (IMDUR) 30 MG 24 hr tablet Take 30 mg by mouth as needed. Reported on 02/10/2016  .  [DISCONTINUED] predniSONE (DELTASONE) 10 MG tablet 6 tablets on Day 1 , then reduce by 1 tablet daily until gone  . temazepam (RESTORIL) 15 MG capsule TAKE ONE CAPSULE AT BEDTIME IF NEEDED FOR SLEEP. MAY REPEAT IN ONE HOUR. (Patient not taking: Reported on 11/08/2016)   Facility-Administered Encounter Medications as of 11/08/2016  Medication  . Ipratropium-Albuterol (COMBIVENT) respimat 1 puff    Activities of Daily Living In your present state of health, do you have any difficulty performing the following activities: 11/08/2016  Hearing? Y  Vision? N  Difficulty concentrating or making decisions? Y  Walking or climbing stairs? N  Dressing or bathing? N  Doing errands, shopping? N  Preparing Food and eating ? N  Using the Toilet? N  In the past six months, have you accidently leaked urine? N  Do you have problems with loss of bowel control? N  Managing your Medications? N  Managing your Finances? N  Housekeeping or managing your Housekeeping? N  Some recent data might be hidden    Patient Care Team: [name], MD as PCP - General (Internal Medicine) Minna Merritts, MD (Cardiology) Seeplaputhur Robinette Haines, MD (General Surgery) Karlyn Agee, MD (Unknown Physician Specialty)    Assessment:    This is a routine wellness examination for [name]. The goal of the wellness visit is to assist the patient how to close the gaps in care and create a preventative care plan for the patient.   Osteoporosis risk reviewed.  Medications reviewed; taking without issues or barriers.  Safety issues reviewed; lives alone.  Life alert and alarm system with smoke and carbon monoxide detectors in the home. No firearms in the home. Wears seatbelts when driving or riding with others. No violence in the home.  No identified risk were noted; The patient was oriented x 3; appropriate in dress and manner and no objective failures at ADL's or IADL's.   BMI; discussed the importance of a  healthy diet, water intake and exercise. Educational material provided.  HTN; followed by PCP.  Health maintenance gaps; closed.  Patient Concerns: None at this time. Follow up with PCP as needed.  Exercise Activities and Dietary recommendations Current Exercise Habits: Home exercise routine (Golf), Type of exercise: stretching, Time (Minutes): 15, Frequency (Times/Week): 3, Weekly Exercise (Minutes/Week): 45, Intensity: Mild  Goals    . Increase water intake          Stay hydrated and drink plenty of water, 4 cups.  Finish entire cup of water when taking medications.  Fall Risk Fall Risk  11/08/2016 08/26/2016 02/09/2015 10/18/2013  Falls in the past year? No No No No   Depression Screen PHQ 2/9 Scores 11/08/2016 02/09/2015 10/18/2013  PHQ - 2 Score 0 0 0     Cognitive Function MMSE - Mini Mental State Exam 11/08/2016  Orientation to time 5  Orientation to Place 5  Registration 3  Attention/ Calculation 5  Recall 3  Language- name 2 objects 2  Language- repeat 1  Language- follow 3 step command 3  Language- read & follow direction 1  Write a sentence 1  Copy design 1  Total score 30        Immunization History  Administered Date(s) Administered  . Influenza Split 07/02/2014, 07/08/2015, 05/20/2016  . Influenza Whole 06/11/2012  . Pneumococcal Conjugate-13 12/24/2013  . Pneumococcal Polysaccharide-23 09/16/2011, 11/09/2015  . Tdap 02/07/2013  . Zoster 07/26/2013   Screening Tests Health Maintenance  Topic Date Due  . TETANUS/TDAP  02/08/2023  . INFLUENZA VACCINE  Completed  . DEXA SCAN  Completed  . PNA vac Low Risk Adult  Completed      Plan:    End of life planning; Advance aging; Advanced directives discussed. Copy of current HCPOA/Living Will requested.  Medicare Attestation I have personally reviewed: The patient's medical and social history Their use of alcohol, tobacco or illicit drugs Their current medications and supplements The  patient's functional ability including ADLs,fall risks, home safety risks, cognitive, and hearing and visual impairment Diet and physical activities Evidence for depression   The patient's weight, height, BMI, and visual acuity have been recorded in the chart.  I have made referrals and provided education to the patient based on review of the above and I have provided the patient with a written personalized care plan for preventive services.    During the course of the visit the patient was educated and counseled about the following appropriate screening and preventive services:   Vaccines to include Pneumoccal, Influenza, Hepatitis B, Td, Zostavax, HCV  Electrocardiogram  Cardiovascular Disease  Colorectal cancer screening  Bone density screening  Diabetes screening  Glaucoma screening  Mammography/PAP  Nutrition counseling   Patient Instructions (the written plan) was given to the patient.   Varney Biles, LPN  1/79/1505

## 2016-11-11 ENCOUNTER — Encounter: Payer: Self-pay | Admitting: Internal Medicine

## 2016-11-11 ENCOUNTER — Ambulatory Visit (INDEPENDENT_AMBULATORY_CARE_PROVIDER_SITE_OTHER): Payer: Medicare Other | Admitting: Internal Medicine

## 2016-11-11 VITALS — BP 130/74 | HR 74 | Temp 97.6°F | Resp 16 | Wt 133.0 lb

## 2016-11-11 DIAGNOSIS — R42 Dizziness and giddiness: Secondary | ICD-10-CM | POA: Diagnosis not present

## 2016-11-11 DIAGNOSIS — H6121 Impacted cerumen, right ear: Secondary | ICD-10-CM

## 2016-11-11 DIAGNOSIS — N183 Chronic kidney disease, stage 3 unspecified: Secondary | ICD-10-CM

## 2016-11-11 DIAGNOSIS — I739 Peripheral vascular disease, unspecified: Secondary | ICD-10-CM

## 2016-11-11 DIAGNOSIS — I779 Disorder of arteries and arterioles, unspecified: Secondary | ICD-10-CM

## 2016-11-11 DIAGNOSIS — R790 Abnormal level of blood mineral: Secondary | ICD-10-CM | POA: Diagnosis not present

## 2016-11-11 LAB — CBC WITH DIFFERENTIAL/PLATELET
BASOS PCT: 1.3 % (ref 0.0–3.0)
Basophils Absolute: 0.1 10*3/uL (ref 0.0–0.1)
EOS PCT: 3.1 % (ref 0.0–5.0)
Eosinophils Absolute: 0.2 10*3/uL (ref 0.0–0.7)
HCT: 39.8 % (ref 36.0–46.0)
HEMOGLOBIN: 13.2 g/dL (ref 12.0–15.0)
LYMPHS ABS: 1.7 10*3/uL (ref 0.7–4.0)
Lymphocytes Relative: 24.3 % (ref 12.0–46.0)
MCHC: 33 g/dL (ref 30.0–36.0)
MCV: 99.3 fl (ref 78.0–100.0)
MONOS PCT: 10 % (ref 3.0–12.0)
Monocytes Absolute: 0.7 10*3/uL (ref 0.1–1.0)
NEUTROS PCT: 61.3 % (ref 43.0–77.0)
Neutro Abs: 4.2 10*3/uL (ref 1.4–7.7)
Platelets: 176 10*3/uL (ref 150.0–400.0)
RBC: 4.01 Mil/uL (ref 3.87–5.11)
RDW: 12.5 % (ref 11.5–15.5)
WBC: 6.9 10*3/uL (ref 4.0–10.5)

## 2016-11-11 LAB — IBC PANEL
Iron: 88 ug/dL (ref 42–145)
Saturation Ratios: 28.6 % (ref 20.0–50.0)
Transferrin: 220 mg/dL (ref 212.0–360.0)

## 2016-11-11 LAB — FERRITIN: Ferritin: 125.8 ng/mL (ref 10.0–291.0)

## 2016-11-11 MED ORDER — DOCUSATE SODIUM 50 MG/5ML PO LIQD: ORAL | 0 refills | Status: DC

## 2016-11-11 NOTE — Assessment & Plan Note (Signed)
Right ear,  Needs irrigation on Monday

## 2016-11-11 NOTE — Patient Instructions (Signed)
Your iron stores were still elevated last month so we repeated them today  MAKE SURE YOU ARE NOT  Taking ANY IRON SUPPLEMENTS  Your right eardrum is impacted.  You can use the colace liquid drops to soften it and return next week for irrigation   YOU NEED TO DRINK AT LEAST 3 16 OUNCE SERVINGS OF WATER,  ORANGE JUICE OR OTHER NON CAFFEINATED,  NON ALCOHOL DRINK DAILY

## 2016-11-11 NOTE — Progress Notes (Signed)
Subjective:  Patient ID: [name], female    DOB: [birth year]  Age: 83 y.o. MRN: [id number]  CC: The primary encounter diagnosis was Abnormal iron saturation. Diagnoses of Iron overload, Impacted cerumen of right ear, Bilateral carotid artery disease (HCC), Dizziness and giddiness, and Chronic kidney disease (CKD), stage III (moderate) were also pertinent to this visit.  HPI AYLINE Allen presents for follow up on multiple issues , including vertigo and eustachian tube dysfunction, as well as elevated iron stores.   Her vertigo has resolved currently, but she continues to feel off balance.   She continues to be concerned about vaginal odor and wears a pad. Denies urinary incontinence   Outpatient Medications Prior to Visit  Medication Sig Dispense Refill  . ALPRAZolam (XANAX) 0.25 MG tablet 1 to 2 tablets at bedtime as needed for insomnia 60 tablet 3  . amLODipine (NORVASC) 2.5 MG tablet TAKE 1 TABLET EVERY DAY 90 tablet 1  . aspirin 81 MG tablet Take 81 mg by mouth daily.      . clopidogrel (PLAVIX) 75 MG tablet TAKE 1 TABLET BY MOUTH ONE TIME DAILY 90 tablet 1  . esomeprazole (NEXIUM) 40 MG capsule TAKE 1 CAPSULE DAILY BEFORE BREAKFAST 90 capsule 1  . Ipratropium-Albuterol (COMBIVENT RESPIMAT) 20-100 MCG/ACT AERS respimat Inhale 1 puff into the lungs every 6 (six) hours. 1 Inhaler 5  . Lactulose 20 GM/30ML SOLN 30 ml every 4 hours until constipation is relieved 236 mL 3  . losartan (COZAAR) 100 MG tablet TAKE 1 TABLET EVERY DAY 90 tablet 1  . Misc Natural Products (CRANBERRY/PROBIOTIC PO) Take 1 tablet by mouth.    . nitroGLYCERIN (NITROSTAT) 0.4 MG SL tablet Place 1 tablet (0.4 mg total) under the tongue every 5 (five) minutes as needed for chest pain. 25 tablet 6  . Psyllium-Calcium (METAMUCIL PLUS CALCIUM) CAPS Take 2 capsules by mouth daily as needed.     . simvastatin (ZOCOR) 40 MG tablet TAKE ONE TABLET AT BEDTIME 90 tablet 1  . temazepam (RESTORIL) 15 MG capsule  TAKE ONE CAPSULE AT BEDTIME IF NEEDED FOR SLEEP. MAY REPEAT IN ONE HOUR. 60 capsule 2  . tioconazole (VAGISTAT) 6.5 % vaginal ointment Place 1 applicator vaginally once. 8 g 0   Facility-Administered Medications Prior to Visit  Medication Dose Route Frequency Provider Last Rate Last Dose  . Ipratropium-Albuterol (COMBIVENT) respimat 1 puff  1 puff Inhalation Q6H PRN Wilhelmina Mcardle, MD        Review of Systems;  Patient denies headache, fevers, malaise, unintentional weight loss, skin rash, eye pain, sinus congestion and sinus pain, sore throat, dysphagia,  hemoptysis , cough, dyspnea, wheezing, chest pain, palpitations, orthopnea, edema, abdominal pain, nausea, melena, diarrhea, constipation, flank pain, dysuria, hematuria, urinary  Frequency, nocturia, numbness, tingling, seizures,  Focal weakness, Loss of consciousness,  Tremor, insomnia, depression, anxiety, and suicidal ideation.      Objective:  BP 130/74   Pulse 74   Temp 97.6 F (36.4 C) (Oral)   Resp 16   Wt 133 lb (60.3 kg)   SpO2 96%   BMI 23.94 kg/m   BP Readings from Last 3 Encounters:  11/11/16 130/74  11/08/16 128/64  08/17/16 140/74    Wt Readings from Last 3 Encounters:  11/11/16 133 lb (60.3 kg)  11/08/16 131 lb 12.8 oz (59.8 kg)  08/17/16 132 lb (59.9 kg)    General appearance: alert, cooperative and appears stated age Ears: normal TM's and external ear canals both ears  Throat: lips, mucosa, and tongue normal; teeth and gums normal Neck: no adenopathy, no carotid bruit, supple, symmetrical, trachea midline and thyroid not enlarged, symmetric, no tenderness/mass/nodules Back: symmetric, no curvature. ROM normal. No CVA tenderness. Lungs: clear to auscultation bilaterally Heart: regular rate and rhythm, S1, S2 normal, no murmur, click, rub or gallop Abdomen: soft, non-tender; bowel sounds normal; no masses,  no organomegaly Pulses: 2+ and symmetric Skin: Skin color, texture, turgor normal. No rashes or  lesions Lymph nodes: Cervical, supraclavicular, and axillary nodes normal.  Lab Results  Component Value Date   HGBA1C 5.8 09/26/2016    Lab Results  Component Value Date   CREATININE 1.29 (H) 08/17/2016   CREATININE 1.18 11/09/2015   CREATININE 1.19 02/09/2015    Lab Results  Component Value Date   WBC 6.9 11/11/2016   HGB 13.2 11/11/2016   HCT 39.8 11/11/2016   PLT 176.0 11/11/2016   GLUCOSE 102 (H) 08/17/2016   CHOL 142 02/09/2015   TRIG 136.0 02/09/2015   HDL 48.40 02/09/2015   LDLDIRECT 72.0 11/09/2015   LDLCALC 66 02/09/2015   ALT 11 09/26/2016   AST 15 09/26/2016   NA 142 08/17/2016   K 4.7 08/17/2016   CL 106 08/17/2016   CREATININE 1.29 (H) 08/17/2016   BUN 21 08/17/2016   CO2 28 08/17/2016   TSH 1.44 02/09/2015   INR 1.0 06/17/2014   HGBA1C 5.8 09/26/2016   MICROALBUR 2.5 (H) 12/26/2011    Nm Myocar Multi W/spect W/wall Motion / Ef  Result Date: 06/29/2016 Pharmacological myocardial perfusion imaging study with no significant  ischemia Normal wall motion, EF estimated at 35% (depressed EF likely secondary to GI uptake artifact) No EKG changes concerning for ischemia at peak stress or in recovery. Low risk scan Signed, Esmond Plants, MD, Ph.D [facility]    Assessment & Plan:   Problem List Items Addressed This Visit    Carotid artery disease ([place])    Noncritical by serial ultrasounds       Cerumen impaction    Right ear,  Needs irrigation on Monday        Chronic kidney disease (CKD), stage III (moderate)    Renal function is at baseline.  She is on an ARB for control of hypertension,, and statin for control of hyperlipidemia.   Lab Results  Component Value Date   CREATININE 1.29 (H) 08/17/2016         Dizziness and giddiness    Chronic, with mild orthostasis due to dehydration       Iron overload    Her iron saturation was high but her ferritin was normal Without polycythemia,  And she was advised to stop her multivitamin   Lab  Results  Component Value Date   IRON 88 11/11/2016   FERRITIN 125.8 11/11/2016   Lab Results  Component Value Date   WBC 6.9 11/11/2016   HGB 13.2 11/11/2016   HCT 39.8 11/11/2016   MCV 99.3 11/11/2016   PLT 176.0 11/11/2016          Other Visit Diagnoses    Abnormal iron saturation    -  Primary   Relevant Orders   IBC panel (Completed)   Ferritin (Completed)      I am having Ms. Cannady start on docusate. I am also having her maintain her aspirin, METAMUCIL PLUS CALCIUM, esomeprazole, tioconazole, temazepam, Ipratropium-Albuterol, Lactulose, amLODipine, clopidogrel, nitroGLYCERIN, losartan, ALPRAZolam, simvastatin, and Misc Natural Products (CRANBERRY/PROBIOTIC PO). We will continue to administer Ipratropium-Albuterol.  Meds ordered  this encounter  Medications  . docusate (COLACE) 50 MG/5ML liquid    Sig: 3 drops in right ear daily  For ear was    Dispense:  10 mL    Refill:  0    There are no discontinued medications.  Follow-up: Return in about 1 day (around 11/12/2016), or rn visit for right ear impaction .   [name], MD

## 2016-11-11 NOTE — Progress Notes (Signed)
Pre visit review using our clinic review tool, if applicable. No additional management support is needed unless otherwise documented below in the visit note. 

## 2016-11-13 ENCOUNTER — Encounter: Payer: Self-pay | Admitting: Internal Medicine

## 2016-11-13 NOTE — Assessment & Plan Note (Signed)
Chronic, with mild orthostasis due to dehydration

## 2016-11-13 NOTE — Assessment & Plan Note (Signed)
Her iron saturation was high but her ferritin was normal Without polycythemia,  And she was advised to stop her multivitamin   Lab Results  Component Value Date   IRON 88 11/11/2016   FERRITIN 125.8 11/11/2016   Lab Results  Component Value Date   WBC 6.9 11/11/2016   HGB 13.2 11/11/2016   HCT 39.8 11/11/2016   MCV 99.3 11/11/2016   PLT 176.0 11/11/2016

## 2016-11-13 NOTE — Assessment & Plan Note (Signed)
Noncritical by serial ultrasounds

## 2016-11-13 NOTE — Assessment & Plan Note (Signed)
Renal function is at baseline.  She is on an ARB for control of hypertension,, and statin for control of hyperlipidemia.   Lab Results  Component Value Date   CREATININE 1.29 (H) 08/17/2016

## 2016-11-14 ENCOUNTER — Ambulatory Visit (INDEPENDENT_AMBULATORY_CARE_PROVIDER_SITE_OTHER): Payer: Medicare Other

## 2016-11-14 DIAGNOSIS — H6121 Impacted cerumen, right ear: Secondary | ICD-10-CM

## 2016-11-14 NOTE — Progress Notes (Signed)
Patient comes in for ear irrigation.  She has been using colace drops in right to help soften the wax .   Irrigated the right ear,and was able to remove dry wax.  Successfully visualized the right ear drum. Patient tolerated the procedure well.

## 2016-11-16 NOTE — Progress Notes (Signed)
  I have reviewed the above information and agree with above.   Gwendolen Hewlett, MD 

## 2016-12-01 ENCOUNTER — Ambulatory Visit: Payer: Medicare Other | Admitting: General Surgery

## 2016-12-02 ENCOUNTER — Encounter: Payer: Self-pay | Admitting: Family Medicine

## 2016-12-02 ENCOUNTER — Ambulatory Visit (INDEPENDENT_AMBULATORY_CARE_PROVIDER_SITE_OTHER): Payer: Medicare Other | Admitting: Family Medicine

## 2016-12-02 VITALS — BP 140/70 | HR 90 | Temp 99.6°F | Wt 132.2 lb

## 2016-12-02 DIAGNOSIS — R05 Cough: Secondary | ICD-10-CM | POA: Diagnosis not present

## 2016-12-02 DIAGNOSIS — R059 Cough, unspecified: Secondary | ICD-10-CM

## 2016-12-02 DIAGNOSIS — J111 Influenza due to unidentified influenza virus with other respiratory manifestations: Secondary | ICD-10-CM | POA: Diagnosis not present

## 2016-12-02 LAB — POCT INFLUENZA A/B
Influenza A, POC: NEGATIVE
Influenza B, POC: NEGATIVE

## 2016-12-02 MED ORDER — OSELTAMIVIR PHOSPHATE 30 MG PO CAPS: 30.0000 mg | ORAL_CAPSULE | Freq: Every day | ORAL | 0 refills | Status: DC

## 2016-12-02 NOTE — Assessment & Plan Note (Addendum)
Patient with symptoms that are clinically consistent with influenza. Rapid flu test was negative. Given consistency with influenza and her high risk for complications from this we will proceed with treatment with Tamiflu. Will be renally dosed for creatinine clearance of 29. She will stay well hydrated. She'll monitor her symptoms and if they worsen she'll seek medical attention. She reports she has hydrocodone cough syrup at home that was previously prescribed for her. She would like to use this for her cough. Discussed that she needs to follow the directions as prescribed previously. Also warned against drowsiness with this medicine. Given return precautions.

## 2016-12-02 NOTE — Progress Notes (Signed)
  Tommi Rumps, MD Phone: [phone]  [name] is a 83 y.o. female who presents today for same-day visit.  Patient notes 3 days of symptoms. She notes symptoms were pretty sudden onset. Has had cough, nasal and sinus congestion, postnasal drip, fever up to 101F, some body aches, some chills, and some mild chest soreness with coughing. She notes no chest pain. No chest soreness when she is not coughing. She notes no shortness of breath. No chest congestion. No wheezing. No sick contacts. She did get a flu shot. She has been staying well-hydrated.  PMH: Former smoker. Has a history of COPD.   ROS see history of present illness  Objective  Physical Exam Vitals:   12/02/16 0938  BP: 140/70  Pulse: 90  Temp: 99.6 F (37.6 C)    BP Readings from Last 3 Encounters:  12/02/16 140/70  11/11/16 130/74  11/08/16 128/64   Wt Readings from Last 3 Encounters:  12/02/16 132 lb 3.2 oz (60 kg)  11/11/16 133 lb (60.3 kg)  11/08/16 131 lb 12.8 oz (59.8 kg)    Physical Exam  Constitutional: No distress.  HENT:  Head: Normocephalic and atraumatic.  Mild posterior oropharyngeal erythema with no exudate, there is some postnasal drip, normal TMs bilaterally  Eyes: Conjunctivae are normal. Pupils are equal, round, and reactive to light.  Neck: Neck supple.  Cardiovascular: Normal rate, regular rhythm and normal heart sounds.   Pulmonary/Chest: Effort normal and breath sounds normal.  Lymphadenopathy:    She has no cervical adenopathy.  Neurological: She is alert. Gait normal.  Skin: Skin is warm and dry. She is not diaphoretic.     Assessment/Plan: Please see individual problem list.  Influenza Patient with symptoms that are clinically consistent with influenza. Rapid flu test was negative. Given consistency with influenza and her high risk for complications from this we will proceed with treatment with Tamiflu. Will be renally dosed for creatinine clearance of 29. She will  stay well hydrated. She'll monitor her symptoms and if they worsen she'll seek medical attention. She reports she has hydrocodone cough syrup at home that was previously prescribed for her. She would like to use this for her cough. Discussed that she needs to follow the directions as prescribed previously. Also warned against drowsiness with this medicine. Given return precautions.   Orders Placed This Encounter  Procedures  . POCT Influenza A/B    Meds ordered this encounter  Medications  . oseltamivir (TAMIFLU) 30 MG capsule    Sig: Take 1 capsule (30 mg total) by mouth daily.    Dispense:  5 capsule    Refill:  0    Renally dosed based on CrCl of 29.    Tommi Rumps, MD [place]

## 2016-12-02 NOTE — Patient Instructions (Signed)
Nice to meet you. We are going to treat you for influenza. You will take Tamiflu once daily for 5 days. Please be wearing when taking the hydrocodone cough syrup. This may make you drowsy. If you develop shortness of breath, cough productive of blood, chest pain, persistent fevers, or any new or changing symptoms please seek medical attention immediately.

## 2016-12-02 NOTE — Progress Notes (Signed)
Pre visit review using our clinic review tool, if applicable. No additional management support is needed unless otherwise documented below in the visit note. 

## 2016-12-05 ENCOUNTER — Telehealth: Payer: Self-pay | Admitting: Internal Medicine

## 2016-12-05 MED ORDER — HYDROCOD POLST-CPM POLST ER 10-8 MG/5ML PO SUER: 5.0000 mL | Freq: Two times a day (BID) | ORAL | 0 refills | Status: DC | PRN

## 2016-12-05 NOTE — Telephone Encounter (Signed)
Please advise 

## 2016-12-05 NOTE — Telephone Encounter (Signed)
Pt states that she needs something for her cough.. She thought she had a rx for something but she didn't.. Please advise.Marland Kitchen Please send to [facility]

## 2016-12-05 NOTE — Telephone Encounter (Signed)
Pt daughter called back and stated that pt has been without medication for 4 days. Also asked that we call once completed. Please advise, thank you!  Call [name] (daughter) @ [phone]

## 2016-12-05 NOTE — Telephone Encounter (Signed)
Called patients daughter and notified her the rx is ready to be picked up, Rx at front desk

## 2016-12-05 NOTE — Telephone Encounter (Signed)
Tussionex printed. Patient will need to pick this up from the office. Thanks.

## 2016-12-07 ENCOUNTER — Telehealth: Payer: Self-pay | Admitting: Internal Medicine

## 2016-12-07 ENCOUNTER — Ambulatory Visit: Payer: Medicare Other

## 2016-12-07 NOTE — Telephone Encounter (Signed)
Patient calls states she is having "coughing seizures"  All throughout the day   Still using the Tussinonex.  Cough is now productive  Phlegm is now green. No fever no , chest pain, no shortness of breath. Please advise.

## 2016-12-07 NOTE — Telephone Encounter (Signed)
Pt called and stated that she now has a productive cough with green phelgm. Pt does have cough medication and wanted to know if it also helps with infection. Please advise, thank you!  Call pt @ [phone]

## 2016-12-07 NOTE — Telephone Encounter (Signed)
Please clarify what patient means by coughing seizures. Are these coughing spells or is she actually having a seizure after coughing? I would suggest reevaluation given her persistent symptoms and productive cough to ensure that she has not developed a secondary pneumonia. Thanks.

## 2016-12-07 NOTE — Telephone Encounter (Signed)
No they're coughing spells per the patient.  Patient descibes them as coughing seizures.  No true seizure. Appointment scheduled to see Dr Derrel Nip for evaluation tomorrow.

## 2016-12-08 ENCOUNTER — Ambulatory Visit (INDEPENDENT_AMBULATORY_CARE_PROVIDER_SITE_OTHER): Payer: Medicare Other | Admitting: Internal Medicine

## 2016-12-08 ENCOUNTER — Encounter: Payer: Self-pay | Admitting: Internal Medicine

## 2016-12-08 ENCOUNTER — Ambulatory Visit (INDEPENDENT_AMBULATORY_CARE_PROVIDER_SITE_OTHER): Payer: Medicare Other

## 2016-12-08 ENCOUNTER — Telehealth: Payer: Self-pay | Admitting: Internal Medicine

## 2016-12-08 DIAGNOSIS — N183 Chronic kidney disease, stage 3 unspecified: Secondary | ICD-10-CM

## 2016-12-08 DIAGNOSIS — E7800 Pure hypercholesterolemia, unspecified: Secondary | ICD-10-CM

## 2016-12-08 DIAGNOSIS — F5105 Insomnia due to other mental disorder: Secondary | ICD-10-CM

## 2016-12-08 DIAGNOSIS — Z8619 Personal history of other infectious and parasitic diseases: Secondary | ICD-10-CM | POA: Diagnosis not present

## 2016-12-08 DIAGNOSIS — R05 Cough: Secondary | ICD-10-CM | POA: Diagnosis not present

## 2016-12-08 DIAGNOSIS — E559 Vitamin D deficiency, unspecified: Secondary | ICD-10-CM

## 2016-12-08 DIAGNOSIS — J209 Acute bronchitis, unspecified: Secondary | ICD-10-CM

## 2016-12-08 DIAGNOSIS — J440 Chronic obstructive pulmonary disease with acute lower respiratory infection: Secondary | ICD-10-CM

## 2016-12-08 DIAGNOSIS — F419 Anxiety disorder, unspecified: Secondary | ICD-10-CM

## 2016-12-08 DIAGNOSIS — J111 Influenza due to unidentified influenza virus with other respiratory manifestations: Secondary | ICD-10-CM | POA: Diagnosis not present

## 2016-12-08 LAB — COMPREHENSIVE METABOLIC PANEL
ALK PHOS: 65 U/L (ref 39–117)
ALT: 11 U/L (ref 0–35)
AST: 12 U/L (ref 0–37)
Albumin: 3.8 g/dL (ref 3.5–5.2)
BUN: 17 mg/dL (ref 6–23)
CO2: 31 mEq/L (ref 19–32)
CREATININE: 1.17 mg/dL (ref 0.40–1.20)
Calcium: 9.3 mg/dL (ref 8.4–10.5)
Chloride: 101 mEq/L (ref 96–112)
GFR: 46.37 mL/min — ABNORMAL LOW (ref >60.00)
GLUCOSE: 110 mg/dL — AB (ref 70–99)
Potassium: 4.2 mEq/L (ref 3.5–5.1)
Sodium: 140 mEq/L (ref 135–145)
TOTAL PROTEIN: 6.1 g/dL (ref 6.0–8.3)
Total Bilirubin: 0.6 mg/dL (ref 0.2–1.2)

## 2016-12-08 LAB — CBC WITH DIFFERENTIAL/PLATELET
BASOS ABS: 0.1 10*3/uL (ref 0.0–0.1)
Basophils Relative: 0.7 % (ref 0.0–3.0)
EOS ABS: 0.3 10*3/uL (ref 0.0–0.7)
Eosinophils Relative: 2.9 % (ref 0.0–5.0)
HCT: 37.9 % (ref 36.0–46.0)
Hemoglobin: 12.8 g/dL (ref 12.0–15.0)
LYMPHS ABS: 1.2 10*3/uL (ref 0.7–4.0)
Lymphocytes Relative: 12.1 % (ref 12.0–46.0)
MCHC: 33.7 g/dL (ref 30.0–36.0)
MCV: 98.1 fl (ref 78.0–100.0)
MONO ABS: 1.2 10*3/uL — AB (ref 0.1–1.0)
Monocytes Relative: 11.7 % (ref 3.0–12.0)
NEUTROS ABS: 7.2 10*3/uL (ref 1.4–7.7)
Neutrophils Relative %: 72.6 % (ref 43.0–77.0)
PLATELETS: 219 10*3/uL (ref 150.0–400.0)
RBC: 3.86 Mil/uL — ABNORMAL LOW (ref 3.87–5.11)
RDW: 12.3 % (ref 11.5–15.5)
WBC: 9.9 10*3/uL (ref 4.0–10.5)

## 2016-12-08 LAB — LIPID PANEL
Cholesterol: 109 mg/dL (ref 0–200)
HDL: 31.3 mg/dL — AB (ref >39.00)
LDL Cholesterol: 53 mg/dL (ref 0–99)
NONHDL: 77.61
Total CHOL/HDL Ratio: 3
Triglycerides: 122 mg/dL (ref 0.0–149.0)
VLDL: 24.4 mg/dL (ref 0.0–40.0)

## 2016-12-08 LAB — VITAMIN D 25 HYDROXY (VIT D DEFICIENCY, FRACTURES): VITD: 36.35 ng/mL (ref 30.00–100.00)

## 2016-12-08 LAB — SEDIMENTATION RATE: Sed Rate: 8 mm/hr (ref 0–30)

## 2016-12-08 MED ORDER — METHYLPREDNISOLONE ACETATE 40 MG/ML IJ SUSP
40.0000 mg | Freq: Once | INTRAMUSCULAR | Status: AC
  Administered 2016-12-08: 40 mg via INTRAMUSCULAR

## 2016-12-08 MED ORDER — PREDNISONE 10 MG PO TABS: ORAL_TABLET | ORAL | 0 refills | Status: DC

## 2016-12-08 NOTE — Telephone Encounter (Signed)
Your chest x ray is clear.  There is no sign of pneumonia, and your labs are normal  so I do not feel you need antibiotics at this time.   Regards,   Dr. Derrel Nip

## 2016-12-08 NOTE — Progress Notes (Signed)
Pre visit review using our clinic review tool, if applicable. No additional management support is needed unless otherwise documented below in the visit note. 

## 2016-12-08 NOTE — Assessment & Plan Note (Signed)
Renal function is improved with d/c of hctz.  She is on an ARB for control of hypertension,, and statin for control of hyperlipidemia.   Lab Results  Component Value Date   CREATININE 1.17 12/08/2016

## 2016-12-08 NOTE — Assessment & Plan Note (Signed)
Will avoid antibiotics unless absolutely necessary

## 2016-12-08 NOTE — Assessment & Plan Note (Signed)
Did not resolve with trial of restoril.   So we have resumed alprazolam.

## 2016-12-08 NOTE — Patient Instructions (Addendum)
I am prescribing you a prednisone taper to take for 8 days.  If your labs and x ray suggest that you have a pneumonia,  I will call in an antibiotic   Continue using the tussionex cough syrup twice daily to SUPPRESS the cough   I also recommending using Simply Saline nasal spray twice daily to flush your sinuses out. (available OTC)

## 2016-12-08 NOTE — Progress Notes (Signed)
Subjective:  Patient ID: IRIEL NASON, female    DOB: 1926-07-09  Age: 83 y.o. MRN: [id number]  CC: Diagnoses of Acute bronchitis with COPD ([place]), Vitamin D deficiency, Pure hypercholesterolemia, CKD (chronic kidney disease) stage 3, GFR 30-59 ml/min, Chronic kidney disease (CKD), stage III (moderate), History of Clostridium difficile colitis, Influenza, Insomnia secondary to anxiety, and Iron overload were pertinent to this visit.  HPI CHARITY TESSIER presents for persistnent cough for the  last 2 weeks.  She was treated on March 16 by ES for clinical diagnosis of influenza with a plan to avoid antibiotics given her history of c dificile colitis .  Her fevers have resolved but she continues to have a persistent cough that is nonproductive  And disruptive to sleep.  Denies facial pain ,  Ear pain, chest pain but is fatigued and short of breath with anything other than ADLs . Using combient as directed  Outpatient Medications Prior to Visit  Medication Sig Dispense Refill  . ALPRAZolam (XANAX) 0.25 MG tablet 1 to 2 tablets at bedtime as needed for insomnia 60 tablet 3  . amLODipine (NORVASC) 2.5 MG tablet TAKE 1 TABLET EVERY DAY 90 tablet 1  . aspirin 81 MG tablet Take 81 mg by mouth daily.      . chlorpheniramine-HYDROcodone (TUSSIONEX PENNKINETIC ER) 10-8 MG/5ML SUER Take 5 mLs by mouth every 12 (twelve) hours as needed for cough. 140 mL 0  . clopidogrel (PLAVIX) 75 MG tablet TAKE 1 TABLET BY MOUTH ONE TIME DAILY 90 tablet 1  . esomeprazole (NEXIUM) 40 MG capsule TAKE 1 CAPSULE DAILY BEFORE BREAKFAST 90 capsule 1  . Ipratropium-Albuterol (COMBIVENT RESPIMAT) 20-100 MCG/ACT AERS respimat Inhale 1 puff into the lungs every 6 (six) hours. 1 Inhaler 5  . losartan (COZAAR) 100 MG tablet TAKE 1 TABLET EVERY DAY 90 tablet 1  . Misc Natural Products (CRANBERRY/PROBIOTIC PO) Take 1 tablet by mouth.    . nitroGLYCERIN (NITROSTAT) 0.4 MG SL tablet Place 1 tablet (0.4 mg total) under the tongue  every 5 (five) minutes as needed for chest pain. 25 tablet 6  . oseltamivir (TAMIFLU) 30 MG capsule Take 1 capsule (30 mg total) by mouth daily. 5 capsule 0  . simvastatin (ZOCOR) 40 MG tablet TAKE ONE TABLET AT BEDTIME 90 tablet 1  . Lactulose 20 GM/30ML SOLN 30 ml every 4 hours until constipation is relieved (Patient not taking: Reported on 12/08/2016) 236 mL 3  . temazepam (RESTORIL) 15 MG capsule TAKE ONE CAPSULE AT BEDTIME IF NEEDED FOR SLEEP. MAY REPEAT IN ONE HOUR. (Patient not taking: Reported on 12/08/2016) 60 capsule 2  . docusate (COLACE) 50 MG/5ML liquid 3 drops in right ear daily  For ear was (Patient not taking: Reported on 12/08/2016) 10 mL 0  . Psyllium-Calcium (METAMUCIL PLUS CALCIUM) CAPS Take 2 capsules by mouth daily as needed.     . tioconazole (VAGISTAT) 6.5 % vaginal ointment Place 1 applicator vaginally once. (Patient not taking: Reported on 12/08/2016) 8 g 0   Facility-Administered Medications Prior to Visit  Medication Dose Route Frequency Provider Last Rate Last Dose  . Ipratropium-Albuterol (COMBIVENT) respimat 1 puff  1 puff Inhalation Q6H PRN Wilhelmina Mcardle, MD        Review of Systems;  Patient denies headache, fevers, unintentional weight loss, skin rash, eye pain, sinus congestion and sinus pain, sore throat, dysphagia,  hemoptysis , wheezing, chest pain, palpitations, orthopnea, edema, abdominal pain, nausea, melena, diarrhea, constipation, flank pain, dysuria, hematuria, urinary  Frequency, nocturia, numbness, tingling, seizures,  Focal weakness, Loss of consciousness,  Tremor, insomnia, depression, anxiety, and suicidal ideation.      Objective:  BP 112/62 (BP Location: Left Arm, Patient Position: Sitting, Cuff Size: Normal)   Pulse 81   Temp 98.4 F (36.9 C) (Oral)   Resp 15   Ht 5' 2.5" (1.588 m)   Wt 131 lb 12.8 oz (59.8 kg)   SpO2 92%   BMI 23.72 kg/m   BP Readings from Last 3 Encounters:  12/08/16 112/62  12/02/16 140/70  11/11/16 130/74     Wt Readings from Last 3 Encounters:  12/08/16 131 lb 12.8 oz (59.8 kg)  12/02/16 132 lb 3.2 oz (60 kg)  11/11/16 133 lb (60.3 kg)    General appearance: alert, cooperative and appears tired,  stated age Ears: normal TM's and external ear canals both ears Throat: lips, mucosa, and tongue normal; teeth and gums normal Neck: no adenopathy, no carotid bruit, supple, symmetrical, trachea midline and thyroid not enlarged, symmetric, no tenderness/mass/nodules Back: symmetric, no curvature. ROM normal. No CVA tenderness. Lungs: clear to auscultation bilaterally with good air movement  Heart: regular rate and rhythm, S1, S2 normal, no murmur, click, rub or gallop Abdomen: soft, non-tender; bowel sounds normal; no masses,  no organomegaly Pulses: 2+ and symmetric Skin: Skin color, texture, turgor normal. No rashes or lesions Lymph nodes: Cervical, supraclavicular, and axillary nodes normal.  Lab Results  Component Value Date   HGBA1C 5.8 09/26/2016    Lab Results  Component Value Date   CREATININE 1.17 12/08/2016   CREATININE 1.29 (H) 08/17/2016   CREATININE 1.18 11/09/2015    Lab Results  Component Value Date   WBC 9.9 12/08/2016   HGB 12.8 12/08/2016   HCT 37.9 12/08/2016   PLT 219.0 12/08/2016   GLUCOSE 110 (H) 12/08/2016   CHOL 109 12/08/2016   TRIG 122.0 12/08/2016   HDL 31.30 (L) 12/08/2016   LDLDIRECT 72.0 11/09/2015   LDLCALC 53 12/08/2016   ALT 11 12/08/2016   AST 12 12/08/2016   NA 140 12/08/2016   K 4.2 12/08/2016   CL 101 12/08/2016   CREATININE 1.17 12/08/2016   BUN 17 12/08/2016   CO2 31 12/08/2016   TSH 1.44 02/09/2015   INR 1.0 06/17/2014   HGBA1C 5.8 09/26/2016   MICROALBUR 2.5 (H) 12/26/2011    Nm Myocar Multi W/spect W/wall Motion / Ef  Result Date: 06/29/2016 Pharmacological myocardial perfusion imaging study with no significant  ischemia Normal wall motion, EF estimated at 35% (depressed EF likely secondary to GI uptake artifact) No EKG  changes concerning for ischemia at peak stress or in recovery. Low risk scan Signed, Esmond Plants, MD, Ph.D [facility]    Assessment & Plan:   Problem List Items Addressed This Visit    Acute bronchitis with COPD ([place])    Treating today with IM depo medrol and 8 day steroid taper  chest x ray is clear.  There is no sign of pneumonia so I do not feel she needs antibiotics at this time.       Relevant Medications   predniSONE (DELTASONE) 10 MG tablet   methylPREDNISolone acetate (DEPO-MEDROL) injection 40 mg (Completed)   Other Relevant Orders   DG Chest 2 View (Completed)   CBC with Differential/Platelet (Completed)   Sedimentation rate (Completed)   Chronic kidney disease (CKD), stage III (moderate)    Renal function is improved with d/c of hctz.  She is on an ARB for control  of hypertension,, and statin for control of hyperlipidemia.   Lab Results  Component Value Date   CREATININE 1.17 12/08/2016         History of Clostridium difficile colitis    Will avoid antibiotics unless absolutely necessary      Hyperlipidemia   Relevant Orders   Lipid panel (Completed)   Influenza    She completed empiric treatment for clinical impression of influenza. ( POCT was negative).  Chest x ray today is negative for acute changes      Insomnia secondary to anxiety    Did not resolve with trial of restoril.   So we have resumed alprazolam.       Iron overload    Resolved with d/c on MVI with iron        Other Visit Diagnoses    Vitamin D deficiency       CKD (chronic kidney disease) stage 3, GFR 30-59 ml/min       Relevant Orders   Comprehensive metabolic panel (Completed)   VITAMIN D 25 Hydroxy (Vit-D Deficiency, Fractures) (Completed)      I have discontinued Ms. Lawrance's METAMUCIL PLUS CALCIUM, tioconazole, and docusate. I am also having her start on predniSONE. Additionally, I am having her maintain her aspirin, esomeprazole, temazepam, Ipratropium-Albuterol,  Lactulose, amLODipine, clopidogrel, nitroGLYCERIN, losartan, ALPRAZolam, simvastatin, Misc Natural Products (CRANBERRY/PROBIOTIC PO), oseltamivir, and chlorpheniramine-HYDROcodone. We administered methylPREDNISolone acetate. We will continue to administer Ipratropium-Albuterol.  Meds ordered this encounter  Medications  . predniSONE (DELTASONE) 10 MG tablet    Sig: 6 tablets daily for 3 days,   then reduce by 1 tablet daily until gone    Dispense:  33 tablet    Refill:  0  . methylPREDNISolone acetate (DEPO-MEDROL) injection 40 mg    Medications Discontinued During This Encounter  Medication Reason  . docusate (COLACE) 50 MG/5ML liquid Patient has not taken in last 30 days  . Psyllium-Calcium (METAMUCIL PLUS CALCIUM) CAPS Patient has not taken in last 30 days  . tioconazole (VAGISTAT) 6.5 % vaginal ointment Patient has not taken in last 30 days    Follow-up: No Follow-up on file.   [name], MD

## 2016-12-08 NOTE — Assessment & Plan Note (Signed)
She completed empiric treatment for clinical impression of influenza. ( POCT was negative).  Chest x ray today is negative for acute changes

## 2016-12-08 NOTE — Telephone Encounter (Signed)
Spoke with pt and informed her of her xray and lab results. Pt gave a verbal understanding.

## 2016-12-08 NOTE — Assessment & Plan Note (Signed)
Resolved with d/c on MVI with iron

## 2016-12-08 NOTE — Assessment & Plan Note (Signed)
Treating today with IM depo medrol and 8 day steroid taper  chest x ray is clear.  There is no sign of pneumonia so I do not feel she needs antibiotics at this time.

## 2016-12-09 ENCOUNTER — Other Ambulatory Visit: Payer: Self-pay | Admitting: Internal Medicine

## 2016-12-13 ENCOUNTER — Ambulatory Visit: Payer: Medicare Other | Admitting: General Surgery

## 2016-12-14 ENCOUNTER — Other Ambulatory Visit: Payer: Self-pay | Admitting: Internal Medicine

## 2016-12-20 ENCOUNTER — Telehealth: Payer: Self-pay | Admitting: Internal Medicine

## 2016-12-20 MED ORDER — IPRATROPIUM-ALBUTEROL 20-100 MCG/ACT IN AERS: 1.0000 | INHALATION_SPRAY | Freq: Four times a day (QID) | RESPIRATORY_TRACT | 5 refills | Status: DC

## 2016-12-20 NOTE — Telephone Encounter (Signed)
Patient seeing PCP on 12/21/16 for continued cough , congestion and malaise,  Patient Combivent inhaler prescribed by Dr. Angelina Ok to fill til patient sees PCP?

## 2016-12-20 NOTE — Telephone Encounter (Signed)
Notified [name] medication has been filled.

## 2016-12-20 NOTE — Telephone Encounter (Signed)
yes refill please

## 2016-12-20 NOTE — Telephone Encounter (Signed)
Pt daughter called and asked to speak with you in regards to the pt continuing to be sick. Please advise, thank you!  Call [place] @ [phone]

## 2016-12-21 ENCOUNTER — Ambulatory Visit: Payer: Medicare Other | Admitting: Internal Medicine

## 2016-12-21 ENCOUNTER — Inpatient Hospital Stay
Admission: EM | Admit: 2016-12-21 | Discharge: 2016-12-23 | DRG: 190 | Disposition: A | Discharge status: Home / Self Care | Payer: Medicare Other | Attending: Internal Medicine | Admitting: Internal Medicine

## 2016-12-21 ENCOUNTER — Encounter: Payer: Self-pay | Admitting: Emergency Medicine

## 2016-12-21 ENCOUNTER — Emergency Department: Payer: Medicare Other

## 2016-12-21 DIAGNOSIS — I2510 Atherosclerotic heart disease of native coronary artery without angina pectoris: Secondary | ICD-10-CM | POA: Diagnosis present

## 2016-12-21 DIAGNOSIS — Z7902 Long term (current) use of antithrombotics/antiplatelets: Secondary | ICD-10-CM | POA: Diagnosis not present

## 2016-12-21 DIAGNOSIS — I10 Essential (primary) hypertension: Secondary | ICD-10-CM | POA: Diagnosis present

## 2016-12-21 DIAGNOSIS — Z79899 Other long term (current) drug therapy: Secondary | ICD-10-CM | POA: Diagnosis not present

## 2016-12-21 DIAGNOSIS — E785 Hyperlipidemia, unspecified: Secondary | ICD-10-CM | POA: Diagnosis present

## 2016-12-21 DIAGNOSIS — Z87891 Personal history of nicotine dependence: Secondary | ICD-10-CM | POA: Diagnosis not present

## 2016-12-21 DIAGNOSIS — Z8249 Family history of ischemic heart disease and other diseases of the circulatory system: Secondary | ICD-10-CM

## 2016-12-21 DIAGNOSIS — Z951 Presence of aortocoronary bypass graft: Secondary | ICD-10-CM | POA: Diagnosis not present

## 2016-12-21 DIAGNOSIS — J441 Chronic obstructive pulmonary disease with (acute) exacerbation: Principal | ICD-10-CM | POA: Diagnosis present

## 2016-12-21 DIAGNOSIS — Z885 Allergy status to narcotic agent status: Secondary | ICD-10-CM | POA: Diagnosis not present

## 2016-12-21 DIAGNOSIS — R6889 Other general symptoms and signs: Secondary | ICD-10-CM | POA: Diagnosis not present

## 2016-12-21 DIAGNOSIS — R55 Syncope and collapse: Secondary | ICD-10-CM | POA: Diagnosis present

## 2016-12-21 DIAGNOSIS — R05 Cough: Secondary | ICD-10-CM | POA: Diagnosis not present

## 2016-12-21 DIAGNOSIS — J209 Acute bronchitis, unspecified: Secondary | ICD-10-CM | POA: Diagnosis present

## 2016-12-21 DIAGNOSIS — Z7982 Long term (current) use of aspirin: Secondary | ICD-10-CM

## 2016-12-21 DIAGNOSIS — J9601 Acute respiratory failure with hypoxia: Secondary | ICD-10-CM | POA: Diagnosis present

## 2016-12-21 DIAGNOSIS — K219 Gastro-esophageal reflux disease without esophagitis: Secondary | ICD-10-CM | POA: Diagnosis present

## 2016-12-21 DIAGNOSIS — Z90710 Acquired absence of both cervix and uterus: Secondary | ICD-10-CM

## 2016-12-21 DIAGNOSIS — R531 Weakness: Secondary | ICD-10-CM | POA: Diagnosis not present

## 2016-12-21 LAB — URINALYSIS, COMPLETE (UACMP) WITH MICROSCOPIC
Bacteria, UA: NONE SEEN
Bilirubin Urine: NEGATIVE
GLUCOSE, UA: NEGATIVE mg/dL
Hgb urine dipstick: NEGATIVE
Ketones, ur: NEGATIVE mg/dL
Nitrite: NEGATIVE
PH: 5 (ref 5.0–8.0)
Protein, ur: 100 mg/dL — AB
SPECIFIC GRAVITY, URINE: 1.023 (ref 1.005–1.030)

## 2016-12-21 LAB — TROPONIN I: Troponin I: <0.03 ng/mL (ref <0.03)

## 2016-12-21 LAB — MAGNESIUM: Magnesium: 1.9 mg/dL (ref 1.7–2.4)

## 2016-12-21 LAB — BASIC METABOLIC PANEL
Anion gap: 13 (ref 5–15)
BUN: 26 mg/dL — ABNORMAL HIGH (ref 6–20)
CALCIUM: 8.5 mg/dL — AB (ref 8.9–10.3)
CHLORIDE: 99 mmol/L — AB (ref 101–111)
CO2: 22 mmol/L (ref 22–32)
CREATININE: 1.52 mg/dL — AB (ref 0.44–1.00)
GFR calc non Af Amer: 29 mL/min — ABNORMAL LOW (ref >60)
GFR, EST AFRICAN AMERICAN: 34 mL/min — AB (ref >60)
GLUCOSE: 128 mg/dL — AB (ref 65–99)
Potassium: 4 mmol/L (ref 3.5–5.1)
Sodium: 134 mmol/L — ABNORMAL LOW (ref 135–145)

## 2016-12-21 LAB — CBC
HCT: 43.6 % (ref 35.0–47.0)
Hemoglobin: 14.6 g/dL (ref 12.0–16.0)
MCH: 32.5 pg (ref 26.0–34.0)
MCHC: 33.4 g/dL (ref 32.0–36.0)
MCV: 97.5 fL (ref 80.0–100.0)
PLATELETS: 173 10*3/uL (ref 150–440)
RBC: 4.48 MIL/uL (ref 3.80–5.20)
RDW: 13 % (ref 11.5–14.5)
WBC: 11.1 10*3/uL — AB (ref 3.6–11.0)

## 2016-12-21 LAB — INFLUENZA PANEL BY PCR (TYPE A & B)
INFLAPCR: NEGATIVE
INFLBPCR: NEGATIVE

## 2016-12-21 MED ORDER — ORAL CARE MOUTH RINSE
15.0000 mL | Freq: Two times a day (BID) | OROMUCOSAL | Status: DC
  Administered 2016-12-21 – 2016-12-23 (×3): 15 mL via OROMUCOSAL

## 2016-12-21 MED ORDER — SODIUM CHLORIDE 0.9 % IV SOLN
INTRAVENOUS | Status: DC
  Administered 2016-12-21: 23:00:00 via INTRAVENOUS

## 2016-12-21 MED ORDER — NITROGLYCERIN 0.4 MG SL SUBL: 0.4000 mg | SUBLINGUAL_TABLET | SUBLINGUAL | Status: DC | PRN

## 2016-12-21 MED ORDER — ACETAMINOPHEN 325 MG PO TABS: 650.0000 mg | ORAL_TABLET | Freq: Four times a day (QID) | ORAL | Status: DC | PRN

## 2016-12-21 MED ORDER — ONDANSETRON HCL 4 MG PO TABS: 4.0000 mg | ORAL_TABLET | Freq: Four times a day (QID) | ORAL | Status: DC | PRN

## 2016-12-21 MED ORDER — IPRATROPIUM-ALBUTEROL 0.5-2.5 (3) MG/3ML IN SOLN
3.0000 mL | Freq: Once | RESPIRATORY_TRACT | Status: AC
  Administered 2016-12-21: 3 mL via RESPIRATORY_TRACT
  Filled 2016-12-21: qty 3

## 2016-12-21 MED ORDER — PANTOPRAZOLE SODIUM 40 MG PO TBEC
40.0000 mg | DELAYED_RELEASE_TABLET | Freq: Every day | ORAL | Status: DC
  Administered 2016-12-22 – 2016-12-23 (×2): 40 mg via ORAL
  Filled 2016-12-21 (×2): qty 1

## 2016-12-21 MED ORDER — METHYLPREDNISOLONE SODIUM SUCC 125 MG IJ SOLR
60.0000 mg | Freq: Once | INTRAMUSCULAR | Status: AC
  Administered 2016-12-21: 60 mg via INTRAVENOUS
  Filled 2016-12-21: qty 2

## 2016-12-21 MED ORDER — ONDANSETRON HCL 4 MG/2ML IJ SOLN: 4.0000 mg | Freq: Four times a day (QID) | INTRAMUSCULAR | Status: DC | PRN

## 2016-12-21 MED ORDER — AZITHROMYCIN 500 MG PO TABS
500.0000 mg | ORAL_TABLET | Freq: Every day | ORAL | Status: AC
  Administered 2016-12-21: 500 mg via ORAL
  Filled 2016-12-21: qty 1

## 2016-12-21 MED ORDER — AMLODIPINE BESYLATE 5 MG PO TABS
2.5000 mg | ORAL_TABLET | Freq: Every day | ORAL | Status: DC
  Administered 2016-12-22 – 2016-12-23 (×2): 2.5 mg via ORAL
  Filled 2016-12-21 (×2): qty 1

## 2016-12-21 MED ORDER — ENOXAPARIN SODIUM 30 MG/0.3ML ~~LOC~~ SOLN
30.0000 mg | SUBCUTANEOUS | Status: DC
Start: 2016-12-22 — End: 2016-12-23
  Filled 2016-12-21: qty 0.3

## 2016-12-21 MED ORDER — LOSARTAN POTASSIUM 50 MG PO TABS
100.0000 mg | ORAL_TABLET | Freq: Every day | ORAL | Status: DC
  Administered 2016-12-22 – 2016-12-23 (×2): 100 mg via ORAL
  Filled 2016-12-21 (×2): qty 2

## 2016-12-21 MED ORDER — ACETAMINOPHEN 650 MG RE SUPP: 650.0000 mg | Freq: Four times a day (QID) | RECTAL | Status: DC | PRN

## 2016-12-21 MED ORDER — SODIUM CHLORIDE 0.9% FLUSH
3.0000 mL | Freq: Two times a day (BID) | INTRAVENOUS | Status: DC
  Administered 2016-12-21 – 2016-12-22 (×3): 3 mL via INTRAVENOUS

## 2016-12-21 MED ORDER — SODIUM CHLORIDE 0.9 % IV BOLUS (SEPSIS)
1000.0000 mL | Freq: Once | INTRAVENOUS | Status: AC
  Administered 2016-12-21: 1000 mL via INTRAVENOUS

## 2016-12-21 MED ORDER — ENOXAPARIN SODIUM 40 MG/0.4ML ~~LOC~~ SOLN: 40.0000 mg | SUBCUTANEOUS | Status: DC

## 2016-12-21 MED ORDER — BENZONATATE 100 MG PO CAPS
100.0000 mg | ORAL_CAPSULE | Freq: Three times a day (TID) | ORAL | Status: DC
  Administered 2016-12-21 – 2016-12-23 (×5): 100 mg via ORAL
  Filled 2016-12-21 (×5): qty 1

## 2016-12-21 MED ORDER — SIMVASTATIN 20 MG PO TABS
40.0000 mg | ORAL_TABLET | Freq: Every day | ORAL | Status: DC
  Administered 2016-12-21: 40 mg via ORAL
  Filled 2016-12-21: qty 4
  Filled 2016-12-21: qty 2

## 2016-12-21 MED ORDER — AZITHROMYCIN 250 MG PO TABS
250.0000 mg | ORAL_TABLET | Freq: Every day | ORAL | Status: DC
  Administered 2016-12-22 – 2016-12-23 (×2): 250 mg via ORAL
  Filled 2016-12-21 (×2): qty 1

## 2016-12-21 MED ORDER — METHYLPREDNISOLONE SODIUM SUCC 125 MG IJ SOLR
60.0000 mg | INTRAMUSCULAR | Status: DC
  Administered 2016-12-21: 60 mg via INTRAVENOUS
  Filled 2016-12-21: qty 2

## 2016-12-21 MED ORDER — RISAQUAD PO CAPS
1.0000 | ORAL_CAPSULE | Freq: Every day | ORAL | Status: DC
  Administered 2016-12-22 – 2016-12-23 (×2): 1 via ORAL
  Filled 2016-12-21 (×2): qty 1

## 2016-12-21 MED ORDER — CLOPIDOGREL BISULFATE 75 MG PO TABS
75.0000 mg | ORAL_TABLET | Freq: Every day | ORAL | Status: DC
  Administered 2016-12-22 – 2016-12-23 (×2): 75 mg via ORAL
  Filled 2016-12-21 (×2): qty 1

## 2016-12-21 MED ORDER — ASPIRIN 81 MG PO CHEW
81.0000 mg | CHEWABLE_TABLET | Freq: Every day | ORAL | Status: DC
  Administered 2016-12-22 – 2016-12-23 (×2): 81 mg via ORAL
  Filled 2016-12-21 (×2): qty 1

## 2016-12-21 MED ORDER — IPRATROPIUM-ALBUTEROL 0.5-2.5 (3) MG/3ML IN SOLN
3.0000 mL | RESPIRATORY_TRACT | Status: DC
  Administered 2016-12-22 (×6): 3 mL via RESPIRATORY_TRACT
  Filled 2016-12-21 (×6): qty 3

## 2016-12-21 MED ORDER — HYDROCOD POLST-CPM POLST ER 10-8 MG/5ML PO SUER
5.0000 mL | Freq: Two times a day (BID) | ORAL | Status: DC
  Administered 2016-12-21 – 2016-12-23 (×3): 5 mL via ORAL
  Filled 2016-12-21 (×3): qty 5

## 2016-12-21 MED ORDER — DOXYCYCLINE HYCLATE 100 MG PO TABS
100.0000 mg | ORAL_TABLET | Freq: Once | ORAL | Status: AC
  Administered 2016-12-21: 100 mg via ORAL
  Filled 2016-12-21: qty 1

## 2016-12-21 NOTE — ED Triage Notes (Signed)
Patient presents to ED via POV with c/o generalized weakness and a syncopal episode she had today. Patient states, "I've been so weak since Sunday. I went to stand up and just fell down". Patient denies hitting head. Patient on Plavix. A&O x4. Weak but equal hand grasps noted bilaterally.

## 2016-12-21 NOTE — ED Notes (Signed)
Pt ambulated with RN at side and oxygen dropped to 87%. Pt does not wear oxygen at home and reports normally being able to ambulate independently but today needed a standby assistance.

## 2016-12-21 NOTE — Progress Notes (Signed)
Lovenox changed to 30 mg daily for BMI <40 and CrCl <30 

## 2016-12-21 NOTE — ED Provider Notes (Signed)
[facility] Emergency Department Provider Note    None    (approximate)  I have reviewed the triage vital signs and the nursing notes.   HISTORY  Chief Complaint Loss of Consciousness    HPI [name] is a 83 y.o. female with a history of COPD as well as CAD  presents withgeneralized weakness and syncopal episode that happened the day. States that she's been feeling very weak since this weekend. Has been treated for the past 2 weeks for bronchitis and COPD exacerbation with steroids. No measured fevers at home but has had a productive cough. Patient states that she was sitting on her sofa and her daughter came over to visit today. She got up and started feeling faint and blacked out. Went down to her knees but was able to get back up to let her in the front door. Denies hitting her head. There is no neck pain.     Past Medical History:  Diagnosis Date  . C. difficile colitis   . CAD (coronary artery disease)    a. Nuclear 10/11, not gated, no scar or ischemia, EF 68%, low risk study; b. [place] 06/18/14: no evidence of infarction or ischemia, no WMA, normalization of TWI in precordial leads, equivocal EKG changes EF 77%, low risk study  . Carotid artery disease ([place])    Doppler January, 2012, 40-59% bilateral, followup one year  . Diffuse cystic mastopathy   . Diverticulitis    last colonoscopy  incomplete Jan 2011  . Dizziness    stable now (Oct 2011) patient tells me question of TIA, we will obtain records  . Gait abnormality    Patient complains of "wobbly gait", December, 2013  . GERD (gastroesophageal reflux disease)   . History of migraines   . Hx of CABG    a. 3 vessel in 1999  . Hyperlipidemia   . Hypertension   . Indigestion    3 weeks, October 2011  . Kidney stones   . Rheumatic fever   . Sinus bradycardia    Mild, December, 2013  . SOB (shortness of breath)   . Syncopal episodes    after 18 holes of golf and  increase hydrochlorothiazide, resolved    Family History  Problem Relation Age of Onset  . Heart disease Mother   . Heart disease Father   . Cancer Neg Hx   . Drug abuse Neg Hx   . Breast cancer Neg Hx    Past Surgical History:  Procedure Laterality Date  . APPENDECTOMY  1950  . BREAST BIOPSY Right 1994  . BREAST CYST ASPIRATION     multiple BIL  . CATARACT EXTRACTION Right 2009  . CATARACT EXTRACTION Left 2011  . COLONOSCOPY  [id number]   Dr. Jamal Collin  . CORONARY ARTERY BYPASS GRAFT  1999  . esophagus stretched    . TONSILLECTOMY  1939  . TOTAL ABDOMINAL HYSTERECTOMY  1968   due to metrorrhagia   Patient Active Problem List   Diagnosis Date Noted  . Influenza 12/02/2016  . Change in bowel movement 08/20/2016  . Iron overload 08/20/2016  . EKG abnormalities 06/17/2016  . Osteopenia 02/13/2016  . Dizziness and giddiness 02/13/2016  . Insomnia secondary to anxiety 07/12/2015  . Loss of weight 02/09/2015  . Essential hypertension 10/07/2014  . Syncopal episodes   . Sinus bradycardia   . Atherosclerosis of aorta ([place]) 06/26/2014  . History of Clostridium difficile colitis 10/20/2013  . History of acute pancreatitis  10/20/2013  . Lumbago 10/04/2013  . Medicare annual wellness visit, subsequent 02/07/2013  . Chest pain at rest 12/26/2011  . Acute bronchitis with COPD ([place]) 12/05/2011  . Hx of CABG   . Chronic kidney disease (CKD), stage III (moderate) 09/25/2011  . Hyperlipidemia   . Hypertension   . SOB (shortness of breath)   . CAD (coronary artery disease)   . Carotid artery disease ([place])       Prior to Admission medications   Medication Sig Start Date End Date Taking? Authorizing Provider  ALPRAZolam Duanne Moron) 0.25 MG tablet 1 to 2 tablets at bedtime as needed for insomnia 08/18/16   [name], MD  amLODipine (NORVASC) 2.5 MG tablet TAKE 1 TABLET EVERY DAY 12/09/16   [name], MD  aspirin 81 MG tablet Take 81 mg by mouth daily.      Historical Provider,  MD  chlorpheniramine-HYDROcodone (TUSSIONEX PENNKINETIC ER) 10-8 MG/5ML SUER Take 5 mLs by mouth every 12 (twelve) hours as needed for cough. 12/05/16   Leone Haven, MD  clopidogrel (PLAVIX) 75 MG tablet TAKE 1 TABLET BY MOUTH ONE TIME DAILY 12/14/16   [name], MD  esomeprazole (NEXIUM) 40 MG capsule TAKE 1 CAPSULE DAILY BEFORE BREAKFAST 05/09/13   [name], MD  Ipratropium-Albuterol (COMBIVENT RESPIMAT) 20-100 MCG/ACT AERS respimat Inhale 1 puff into the lungs every 6 (six) hours. 12/20/16   [name], MD  Lactulose 20 GM/30ML SOLN 30 ml every 4 hours until constipation is relieved Patient not taking: Reported on 12/08/2016 04/14/16   [name], MD  losartan (COZAAR) 100 MG tablet TAKE 1 TABLET EVERY DAY 07/12/16   [name], MD  Misc Natural Products (CRANBERRY/PROBIOTIC PO) Take 1 tablet by mouth.    Historical Provider, MD  nitroGLYCERIN (NITROSTAT) 0.4 MG SL tablet Place 1 tablet (0.4 mg total) under the tongue every 5 (five) minutes as needed for chest pain. 06/17/16   Minna Merritts, MD  oseltamivir (TAMIFLU) 30 MG capsule Take 1 capsule (30 mg total) by mouth daily. 12/02/16   Leone Haven, MD  predniSONE (DELTASONE) 10 MG tablet 6 tablets daily for 3 days,   then reduce by 1 tablet daily until gone 12/08/16   [name], MD  simvastatin (ZOCOR) 40 MG tablet TAKE ONE TABLET AT BEDTIME 10/11/16   Minna Merritts, MD  temazepam (RESTORIL) 15 MG capsule TAKE ONE CAPSULE AT BEDTIME IF NEEDED FOR SLEEP. MAY REPEAT IN ONE HOUR. Patient not taking: Reported on 12/08/2016 04/07/16   [name], MD    Allergies Codeine    Social History Social History  Substance Use Topics  . Smoking status: Former Smoker    Packs/day: 0.50    Years: 40.00    Types: Cigarettes    Quit date: 09/20/1983  . Smokeless tobacco: Never Used  . Alcohol use Yes     Comment: yes, social wine    Review of Systems Patient denies headaches, rhinorrhea, blurry vision, numbness,  shortness of breath, chest pain, edema, cough, abdominal pain, nausea, vomiting, diarrhea, dysuria, fevers, rashes or hallucinations unless otherwise stated above in HPI. ____________________________________________   PHYSICAL EXAM:  VITAL SIGNS: Vitals:   12/21/16 1800 12/21/16 1900  BP: (!) 121/93 127/68  Pulse: 90 82  Resp: 16 20  Temp:      Constitutional: Alert and oriented. Elderly, frail and fatigued appearing but in no acute distress. Eyes: Conjunctivae are normal. PERRL. EOMI. Head: Atraumatic. Nose:  No congestion/rhinnorhea. Mouth/Throat: Mucous membranes are dry.  Oropharynx non-erythematous. Neck: No stridor. Painless ROM. No cervical spine tenderness to palpation Hematological/Lymphatic/Immunilogical: No cervical lymphadenopathy. Cardiovascular: Normal rate, regular rhythm. Grossly normal heart sounds.  Good peripheral circulation. Respiratory: diffuse wheezing with prolonged expiratory phase, no crackles,  Mild tacypnea Gastrointestinal: Soft and nontender. No distention. No abdominal bruits. No CVA tenderness. Musculoskeletal: No lower extremity tenderness nor edema.  No joint effusions. Neurologic:  Normal speech and language. No gross focal neurologic deficits are appreciated. Skin:  Skin is warm, dry and intact. + skin tenting Psychiatric: Mood and affect are normal. Speech and behavior are normal.  ____________________________________________   LABS (all labs ordered are listed, but only abnormal results are displayed)  Results for orders placed or performed during the hospital encounter of 12/21/16 (from the past 24 hour(s))  Basic metabolic panel     Status: Abnormal   Collection Time: 12/21/16  4:28 PM  Result Value Ref Range   Sodium 134 (L) 135 - 145 mmol/L   Potassium 4.0 3.5 - 5.1 mmol/L   Chloride 99 (L) 101 - 111 mmol/L   CO2 22 22 - 32 mmol/L   Glucose, Bld 128 (H) 65 - 99 mg/dL   BUN 26 (H) 6 - 20 mg/dL   Creatinine, Ser 1.52 (H) 0.44 - 1.00  mg/dL   Calcium 8.5 (L) 8.9 - 10.3 mg/dL   GFR calc non Af Amer 29 (L) >60 mL/min   GFR calc Af Amer 34 (L) >60 mL/min   Anion gap 13 5 - 15  CBC     Status: Abnormal   Collection Time: 12/21/16  4:28 PM  Result Value Ref Range   WBC 11.1 (H) 3.6 - 11.0 K/uL   RBC 4.48 3.80 - 5.20 MIL/uL   Hemoglobin 14.6 12.0 - 16.0 g/dL   HCT 43.6 35.0 - 47.0 %   MCV 97.5 80.0 - 100.0 fL   MCH 32.5 26.0 - 34.0 pg   MCHC 33.4 32.0 - 36.0 g/dL   RDW 13.0 11.5 - 14.5 %   Platelets 173 150 - 440 K/uL  Urinalysis, Complete w Microscopic     Status: Abnormal   Collection Time: 12/21/16  4:28 PM  Result Value Ref Range   Color, Urine AMBER (A) YELLOW   APPearance TURBID (A) CLEAR   Specific Gravity, Urine 1.023 1.005 - 1.030   pH 5.0 5.0 - 8.0   Glucose, UA NEGATIVE NEGATIVE mg/dL   Hgb urine dipstick NEGATIVE NEGATIVE   Bilirubin Urine NEGATIVE NEGATIVE   Ketones, ur NEGATIVE NEGATIVE mg/dL   Protein, ur 100 (A) NEGATIVE mg/dL   Nitrite NEGATIVE NEGATIVE   Leukocytes, UA TRACE (A) NEGATIVE   RBC / HPF 0-5 0 - 5 RBC/hpf   WBC, UA 6-30 0 - 5 WBC/hpf   Bacteria, UA NONE SEEN NONE SEEN   Squamous Epithelial / LPF 0-5 (A) NONE SEEN   Mucous PRESENT    Hyaline Casts, UA PRESENT    Amorphous Crystal PRESENT    Non Squamous Epithelial 0-5 (A) NONE SEEN  Troponin I     Status: None   Collection Time: 12/21/16  4:28 PM  Result Value Ref Range   Troponin I <0.03 <0.03 ng/mL  Magnesium     Status: None   Collection Time: 12/21/16  4:28 PM  Result Value Ref Range   Magnesium 1.9 1.7 - 2.4 mg/dL   ____________________________________________  EKG My review and personal interpretation at Time: 16:42   Indication: syncope  Rate: 90  Rhythm: sinus Axis: normal Other:  Subtle st depressions in inferolateral leads, no st elevations, normal intervals ____________________________________________  RADIOLOGY  I personally reviewed all radiographic images ordered to evaluate for the above acute  complaints and reviewed radiology reports and findings.  These findings were personally discussed with the patient.  Please see medical record for radiology report.  ____________________________________________   PROCEDURES  Procedure(s) performed:  Procedures    Critical Care performed: yes CRITICAL CARE Performed by: Merlyn Lot   Total critical care time: 30 minutes  Critical care time was exclusive of separately billable procedures and treating other patients.  Critical care was necessary to treat or prevent imminent or life-threatening deterioration.  Critical care was time spent personally by me on the following activities: development of treatment plan with patient and/or surrogate as well as nursing, discussions with consultants, evaluation of patient's response to treatment, examination of patient, obtaining history from patient or surrogate, ordering and performing treatments and interventions, ordering and review of laboratory studies, ordering and review of radiographic studies, pulse oximetry and re-evaluation of patient's condition.  ____________________________________________   INITIAL IMPRESSION / ASSESSMENT AND PLAN / ED COURSE  Pertinent labs & imaging results that were available during my care of the patient were reviewed by me and considered in my medical decision making (see chart for details).  DDX: Asthma, copd, CHF, pna, ptx, malignancy, anemia   [name] is a 83 y.o. who presents to the ED with syncopal event with shortness of breath in setting of known history of COPD not on any home oxygen. Patient with EKG with nonspecific changes but her troponin is negative. Denies any chest pain or palpitations. Syncopal event certainly sounds to be secondary to dehydration with probable orthostasis or vasovagal. No head injury. Patient arrives afebrile but has a very productive sounding cough and in the setting of her COPD I am concern for pneumonia.  She has a mild leukocytosis which may be secondary to recent prednisone use but cannot exclude infectious process. Chest x-ray shows no consolidation but chronic COPD changes. Patient was ambulated and became hypoxic down to 87% after receiving multiple nebulizer treatments. Based on her acute respiratory failure with hypoxia do feel the patient will require admission for further evaluation and management of her presentation.  Have discussed with the patient and available family all diagnostics and treatments performed thus far and all questions were answered to the best of my ability. The patient demonstrates understanding and agreement with plan.       ____________________________________________   FINAL CLINICAL IMPRESSION(S) / ED DIAGNOSES  Final diagnoses:  Acute respiratory failure with hypoxia (HCC)  COPD exacerbation (HCC)      NEW MEDICATIONS STARTED DURING THIS VISIT:  New Prescriptions   No medications on file     Note:  This document was prepared using Dragon voice recognition software and may include unintentional dictation errors.    Merlyn Lot, MD 12/21/16 [phone]

## 2016-12-21 NOTE — ED Notes (Signed)
Patient placed on 2L [place] due to SpO2 90% on RA.

## 2016-12-21 NOTE — H&P (Signed)
[facility] [place] NAME: [name]    MR#:  [id number]  DATE OF BIRTH:  [birth year]  DATE OF ADMISSION:  12/21/2016  PRIMARY CARE PHYSICIAN: [name], MD   REQUESTING/REFERRING PHYSICIAN: Dr. Merlyn Lot  CHIEF COMPLAINT:   Chief Complaint  Patient presents with  . Loss of Consciousness    HISTORY OF PRESENT ILLNESS:  [name]  is a 83 y.o. female with a known history of CAD status post CABG several years ago, diverticulitis, GERD, hypertension, history of C. difficile colitis, COPD not on home oxygen presents to hospital secondary to worsening shortness of breath and weakness for 3 days now. Symptoms started with cough for almost 3 weeks now, but over the last 3-4 days her breathing has worsened. She had been having severe coughing spells and she had a syncopal episode when she got up today. Patient also complains of generalized weakness. At baseline she does not need any assistance walking but has been using one person assistance since today. Denies any fevers or chills. Complains of some chest tightness and took a nitroglycerin tablet today which relieve the tightness. No recent cardiac symptoms otherwise. No nausea vomiting or diarrhea. Flu test has been negative in the hospital. Chest x-ray with emphysema and bronchitis changes. Patient wanted to go home today, however became hypoxic with sats dropping into the 80s with ambulation and also needing assistance. So she is being admitted for COPD exacerbation  PAST MEDICAL HISTORY:   Past Medical History:  Diagnosis Date  . C. difficile colitis   . CAD (coronary artery disease)    a. Nuclear 10/11, not gated, no scar or ischemia, EF 68%, low risk study; b. Elaine 06/18/14: no evidence of infarction or ischemia, no WMA, normalization of TWI in precordial leads, equivocal EKG changes EF 77%, low risk study  . Carotid artery disease ([place])    Doppler January,  2012, 40-59% bilateral, followup one year  . Diffuse cystic mastopathy   . Diverticulitis    last colonoscopy  incomplete Jan 2011  . Dizziness    stable now (Oct 2011) patient tells me question of TIA, we will obtain records  . Gait abnormality    Patient complains of "wobbly gait", December, 2013  . GERD (gastroesophageal reflux disease)   . History of migraines   . Hx of CABG    a. 3 vessel in 1999  . Hyperlipidemia   . Hypertension   . Indigestion    3 weeks, October 2011  . Kidney stones   . Rheumatic fever   . Sinus bradycardia    Mild, December, 2013  . SOB (shortness of breath)   . Syncopal episodes    after 18 holes of golf and increase hydrochlorothiazide, resolved     PAST SURGICAL HISTORY:   Past Surgical History:  Procedure Laterality Date  . APPENDECTOMY  1950  . BREAST BIOPSY Right 1994  . BREAST CYST ASPIRATION     multiple BIL  . CATARACT EXTRACTION Right 2009  . CATARACT EXTRACTION Left 2011  . COLONOSCOPY  [id number]   Dr. Jamal Collin  . CORONARY ARTERY BYPASS GRAFT  1999  . esophagus stretched    . TONSILLECTOMY  1939  . TOTAL ABDOMINAL HYSTERECTOMY  1968   due to metrorrhagia    SOCIAL HISTORY:   Social History  Substance Use Topics  . Smoking status: Former Smoker    Packs/day: 0.50    Years: 40.00  Types: Cigarettes    Quit date: 09/20/1983  . Smokeless tobacco: Never Used  . Alcohol use Yes     Comment: yes, social wine    FAMILY HISTORY:   Family History  Problem Relation Age of Onset  . Heart disease Mother   . Heart disease Father   . Cancer Neg Hx   . Drug abuse Neg Hx   . Breast cancer Neg Hx     DRUG ALLERGIES:   Allergies  Allergen Reactions  . Codeine Nausea And Vomiting    REVIEW OF SYSTEMS:   Review of Systems  Constitutional: Positive for malaise/fatigue. Negative for chills, fever and weight loss.  HENT: Negative for ear discharge, ear pain, hearing loss and nosebleeds.   Eyes: Negative for blurred vision,  double vision and photophobia.  Respiratory: Positive for shortness of breath. Negative for cough, hemoptysis and wheezing.   Cardiovascular: Positive for chest pain. Negative for palpitations, orthopnea and leg swelling.  Gastrointestinal: Negative for abdominal pain, constipation, diarrhea, melena, nausea and vomiting.  Genitourinary: Negative for dysuria and urgency.  Musculoskeletal: Negative for back pain, myalgias and neck pain.  Skin: Negative for rash.  Neurological: Negative for dizziness, tremors, sensory change, speech change and focal weakness.  Endo/Heme/Allergies: Does not bruise/bleed easily.  Psychiatric/Behavioral: Negative for depression.    MEDICATIONS AT HOME:   Prior to Admission medications   Medication Sig Start Date End Date Taking? Authorizing Provider  amLODipine (NORVASC) 2.5 MG tablet TAKE 1 TABLET EVERY DAY 12/09/16  Yes [name], MD  aspirin 81 MG tablet Take 81 mg by mouth daily.     Yes Historical Provider, MD  chlorpheniramine-HYDROcodone (TUSSIONEX PENNKINETIC ER) 10-8 MG/5ML SUER Take 5 mLs by mouth every 12 (twelve) hours as needed for cough. 12/05/16  Yes Leone Haven, MD  clopidogrel (PLAVIX) 75 MG tablet TAKE 1 TABLET BY MOUTH ONE TIME DAILY 12/14/16  Yes [name], MD  esomeprazole (NEXIUM) 40 MG capsule TAKE 1 CAPSULE DAILY BEFORE BREAKFAST 05/09/13  Yes [name], MD  Ipratropium-Albuterol (COMBIVENT RESPIMAT) 20-100 MCG/ACT AERS respimat Inhale 1 puff into the lungs every 6 (six) hours. 12/20/16  Yes [name], MD  losartan (COZAAR) 100 MG tablet TAKE 1 TABLET EVERY DAY 07/12/16  Yes [name], MD  nitroGLYCERIN (NITROSTAT) 0.4 MG SL tablet Place 1 tablet (0.4 mg total) under the tongue every 5 (five) minutes as needed for chest pain. 06/17/16  Yes Minna Merritts, MD  simvastatin (ZOCOR) 40 MG tablet TAKE ONE TABLET AT BEDTIME 10/11/16  Yes Minna Merritts, MD  ALPRAZolam Duanne Moron) 0.25 MG tablet 1 to 2 tablets at bedtime as  needed for insomnia Patient not taking: Reported on 12/21/2016 08/18/16   [name], MD  Lactulose 20 GM/30ML SOLN 30 ml every 4 hours until constipation is relieved Patient not taking: Reported on 12/08/2016 04/14/16   [name], MD  temazepam (RESTORIL) 15 MG capsule TAKE ONE CAPSULE AT BEDTIME IF NEEDED FOR SLEEP. MAY REPEAT IN ONE HOUR. Patient not taking: Reported on 12/08/2016 04/07/16   [name], MD      VITAL SIGNS:  Blood pressure (!) 160/57, pulse 69, temperature 97.6 F (36.4 C), temperature source Oral, resp. rate 13, height 5\' 3"  (1.6 m), weight 59 kg (130 lb), SpO2 99 %.  PHYSICAL EXAMINATION:   Physical Exam  GENERAL:  83 y.o.-year-old patient lying in the bed with no acute distress.  EYES: Pupils equal, round, reactive to  light and accommodation. No scleral icterus. Extraocular muscles intact.  HEENT: Head atraumatic, normocephalic. Oropharynx and nasopharynx clear.  NECK:  Supple, no jugular venous distention. No thyroid enlargement, no tenderness.  LUNGS: Tight breath sounds bilaterally, scattered wheezing, no rales,rhonchi or crepitation. No use of accessory muscles of respiration.  CARDIOVASCULAR: S1, S2 normal. No  rubs, or gallops. 3/6 systolic murmur is present ABDOMEN: Soft, nontender, nondistended. Bowel sounds present. No organomegaly or mass.  EXTREMITIES: No pedal edema, cyanosis, or clubbing.  NEUROLOGIC: Cranial nerves II through XII are intact. Muscle strength 5/5 in all extremities. Sensation intact. Gait not checked.  PSYCHIATRIC: The patient is alert and oriented x 3.  SKIN: No obvious rash, lesion, or ulcer.   LABORATORY PANEL:   CBC  Recent Labs Lab 12/21/16 1628  WBC 11.1*  HGB 14.6  HCT 43.6  PLT 173   ------------------------------------------------------------------------------------------------------------------  Chemistries   Recent Labs Lab 12/21/16 1628  NA 134*  K 4.0  CL 99*  CO2 22  GLUCOSE 128*  BUN 26*    CREATININE 1.52*  CALCIUM 8.5*  MG 1.9   ------------------------------------------------------------------------------------------------------------------  Cardiac Enzymes  Recent Labs Lab 12/21/16 1628  TROPONINI <0.03   ------------------------------------------------------------------------------------------------------------------  RADIOLOGY:  Dg Chest 2 View  Result Date: 12/21/2016 CLINICAL DATA:  Cough and weakness for 3 weeks. Acute shortness of breath. Assess for pneumonia. EXAM: CHEST  2 VIEW COMPARISON:  Chest radiograph December 08, 2016 FINDINGS: Cardiac silhouette is normal. Mildly calcified aortic knob. Moderate hiatal hernia better seen on prior radiograph. Similar hyperinflation without focal consolidation or pleural effusion. No pneumothorax. Status post median sternotomy for CABG. Mild degenerative change of the thoracic spine. IMPRESSION: Stable examination:  No acute cardiopulmonary process. Moderate hiatal hernia. Electronically Signed   By: Elon Alas M.D.   On: 12/21/2016 18:32    EKG:   Orders placed or performed during the hospital encounter of 12/21/16  . ED EKG  . ED EKG    IMPRESSION AND PLAN:   Qianna Clagett  is a 83 y.o. female with a known history of CAD status post CABG several years ago, diverticulitis, GERD, hypertension, history of C. difficile colitis, COPD not on home oxygen presents to hospital secondary to worsening shortness of breath and weakness for 3 days now.  #1 COPD exacerbation with acute bronchitis-started on steroids, and duo nebs -Oxygen as tolerated. Check ambulatory sats prior to discharge. -Chest tightness secondary to COPD, however due to prior cardiac history we'll recycle troponins and monitor on-off unit telemetry. -Start cough medications. -Azithromycin for bronchitis  #2 CAD status post CABG-stable at this time. Monitor on telemetry and recycle troponins. -Continue aspirin and Plavix.  #3 hypertension-patient  on losartan, Norvasc  #4 GERD-continue PPI  #5 hyperlipidemia-on simvastatin  #6 DVT prophylaxis-Lovenox  Physical therapy prior to discharge   All the records are reviewed and case discussed with ED provider. Management plans discussed with the patient, family and they are in agreement.  CODE STATUS: Full code  TOTAL TIME TAKING CARE OF THIS PATIENT: 50 minutes.    Gladstone Lighter M.D on 12/21/2016 at 9:01 PM  Between 7am to 6pm - Pager - [phone]  After 6pm go to [email or web address] - password [email or web address] [facility]  Office  [phone]  CC: Primary care physician; [name], MD

## 2016-12-21 NOTE — ED Notes (Signed)
Report given to Kuch, RN.  

## 2016-12-21 NOTE — ED Notes (Signed)
MD at bedside. 

## 2016-12-22 LAB — BASIC METABOLIC PANEL
ANION GAP: 10 (ref 5–15)
BUN: 26 mg/dL — ABNORMAL HIGH (ref 6–20)
CHLORIDE: 103 mmol/L (ref 101–111)
CO2: 22 mmol/L (ref 22–32)
Calcium: 8 mg/dL — ABNORMAL LOW (ref 8.9–10.3)
Creatinine, Ser: 1.25 mg/dL — ABNORMAL HIGH (ref 0.44–1.00)
GFR, EST AFRICAN AMERICAN: 43 mL/min — AB (ref >60)
GFR, EST NON AFRICAN AMERICAN: 37 mL/min — AB (ref >60)
Glucose, Bld: 171 mg/dL — ABNORMAL HIGH (ref 65–99)
POTASSIUM: 4.2 mmol/L (ref 3.5–5.1)
SODIUM: 135 mmol/L (ref 135–145)

## 2016-12-22 LAB — CBC
HEMATOCRIT: 39.3 % (ref 35.0–47.0)
HEMOGLOBIN: 13.2 g/dL (ref 12.0–16.0)
MCH: 32.4 pg (ref 26.0–34.0)
MCHC: 33.7 g/dL (ref 32.0–36.0)
MCV: 96.3 fL (ref 80.0–100.0)
Platelets: 147 10*3/uL — ABNORMAL LOW (ref 150–440)
RBC: 4.08 MIL/uL (ref 3.80–5.20)
RDW: 12.8 % (ref 11.5–14.5)
WBC: 8.2 10*3/uL (ref 3.6–11.0)

## 2016-12-22 LAB — TROPONIN I: Troponin I: <0.03 ng/mL (ref <0.03)

## 2016-12-22 MED ORDER — IPRATROPIUM-ALBUTEROL 0.5-2.5 (3) MG/3ML IN SOLN
3.0000 mL | Freq: Three times a day (TID) | RESPIRATORY_TRACT | Status: DC
  Administered 2016-12-23: 3 mL via RESPIRATORY_TRACT
  Filled 2016-12-22: qty 3

## 2016-12-22 MED ORDER — MAGNESIUM HYDROXIDE 400 MG/5ML PO SUSP
30.0000 mL | Freq: Once | ORAL | Status: AC
  Administered 2016-12-22: 30 mL via ORAL
  Filled 2016-12-22: qty 30

## 2016-12-22 MED ORDER — ATORVASTATIN CALCIUM 20 MG PO TABS: 20.0000 mg | ORAL_TABLET | Freq: Every day | ORAL | Status: DC

## 2016-12-22 MED ORDER — PREDNISONE 50 MG PO TABS
50.0000 mg | ORAL_TABLET | Freq: Every day | ORAL | Status: DC
  Administered 2016-12-22 – 2016-12-23 (×2): 50 mg via ORAL
  Filled 2016-12-22 (×2): qty 1

## 2016-12-22 MED ORDER — ENSURE ENLIVE PO LIQD
237.0000 mL | Freq: Two times a day (BID) | ORAL | Status: DC
  Administered 2016-12-22: 237 mL via ORAL

## 2016-12-22 NOTE — Progress Notes (Signed)
Patient is A&O x4. Up with SBA. Weaned off O2 this shift. Bed alarm on for safety. Takes meds whole. Tolerated diet without difficulty.

## 2016-12-22 NOTE — Evaluation (Signed)
Physical Therapy Evaluation Patient Details Name: [name] MRN: [id number] DOB: 03/23/1926 Today's Date: 12/22/2016   History of Present Illness  Pt admitted for syncopal episode. History includes CAD s/p CABG, diverticulitis, GERD, HTN, and COPD (not on home O2).   Clinical Impression  Pt is a pleasant 83 year old female who was admitted for syncopal episode. Pt demonstrates all bed mobility/transfers/ambulation at baseline level. No AD required. All mobility performed on 1L of O2 with sats >90%. Recommend RN to continue weaning off O2 as pt does not have chronic O2 at home. Pt does not require any further PT needs at this time. Pt will be dc in house and does not require follow up. RN aware. Will dc current orders.      Follow Up Recommendations No PT follow up    Equipment Recommendations  None recommended by PT    Recommendations for Other Services       Precautions / Restrictions Precautions Precautions: Fall Restrictions Weight Bearing Restrictions: No      Mobility  Bed Mobility Overal bed mobility: Independent             General bed mobility comments: Slightly impulsive and tries to get up prior to therapist instruction.   Transfers Overall transfer level: Independent Equipment used: None             General transfer comment: safe technique performed without AD. ALl mobility performed on 1L of O2 per RN request.  Ambulation/Gait Ambulation/Gait assistance: Supervision Ambulation Distance (Feet): 200 Feet Assistive device: None Gait Pattern/deviations: WFL(Within Functional Limits)     General Gait Details: safe technique with ambulation without AD. Pt with reciprocal gait pattern. Pt able to maintain sats at 90-94% with 1L of O2 with no SOB symptoms. Pt reports she feels fatigued after ambulation.  Stairs            Wheelchair Mobility    Modified Rankin (Stroke Patients Only)       Balance Overall balance assessment: History  of Falls                                           Pertinent Vitals/Pain Pain Assessment: No/denies pain    Home Living Family/patient expects to be discharged to:: Private residence Living Arrangements: Alone   Type of Home: House Home Access: Stairs to enter Entrance Stairs-Rails: Can reach both Entrance Stairs-Number of Steps: 3 Home Layout: Bed/bath upstairs Home Equipment: None      Prior Function Level of Independence: Independent         Comments: reports no other falls than recent syncopal episode     Hand Dominance        Extremity/Trunk Assessment   Upper Extremity Assessment Upper Extremity Assessment: Overall WFL for tasks assessed    Lower Extremity Assessment Lower Extremity Assessment: Overall WFL for tasks assessed       Communication   Communication: No difficulties  Cognition Arousal/Alertness: Awake/alert Behavior During Therapy: WFL for tasks assessed/performed Overall Cognitive Status: Within Functional Limits for tasks assessed                                        General Comments      Exercises     Assessment/Plan    PT Assessment Patent  does not need any further PT services  PT Problem List         PT Treatment Interventions      PT Goals (Current goals can be found in the Care Plan section)  Acute Rehab PT Goals Patient Stated Goal: to get to go home PT Goal Formulation: All assessment and education complete, DC therapy Time For Goal Achievement: 12/22/16 Potential to Achieve Goals: Good    Frequency     Barriers to discharge        Co-evaluation               End of Session Equipment Utilized During Treatment: Gait belt;Oxygen Activity Tolerance: Patient tolerated treatment well Patient left: in bed;with bed alarm set (refuses to sit in chair this date) Nurse Communication: Mobility status PT Visit Diagnosis: Unsteadiness on feet (R26.81);History of falling  (Z91.81)    Time: 4967-5916 PT Time Calculation (min) (ACUTE ONLY): 15 min   Charges:   PT Evaluation $PT Eval Low Complexity: 1 Procedure     PT G Codes:        Greggory Stallion, PT, DPT [phone]   Mckinsey Keagle 12/22/2016, 12:16 PM

## 2016-12-22 NOTE — Progress Notes (Signed)
Initial Nutrition Assessment  DOCUMENTATION CODES:   Severe malnutrition in context of chronic illness  INTERVENTION:  1. Ensure Enlive po BID, each supplement provides 350 kcal and 20 grams of protein  NUTRITION DIAGNOSIS:   Malnutrition related to chronic illness as evidenced by percent weight loss, energy intake < or equal to 75% for > or equal to 1 month.  GOAL:   Patient will meet greater than or equal to 90% of their needs  MONITOR:   PO intake, I & O's, Labs, Weight trends, Supplement acceptance  REASON FOR ASSESSMENT:   Malnutrition Screening Tool    ASSESSMENT:   [name]  is a 83 y.o. female with a known history of CAD status post CABG several years ago, diverticulitis, GERD, hypertension, history of C. difficile colitis, COPD not on home oxygen presents to hospital secondary to worsening shortness of breath and weakness for 3 days now  Spoke with [name] at bedside. She reports poor appetite for unspecified period of time. Prior to admission - she had eaten nothing for 3 weeks.  Bought ensure 1 day prior to admission but did not drink it.  Claims she "lost a bunch of weight" but was unsure how much. Skin is very loose, hanging - but according to chart patient's weight was stable for most of last year - recently exhibits a 7#/5.4% severe wt loss over 13 days.  She did eat well this morning, had french toast, pancakes, sausage, yogurt, orange juice - ate 75%  Nutrition-Focused physical exam completed. Findings are severe fat depletion, severe muscle depletion, and no edema.   Labs and medications reviewed: Solumedrol NS @ 44mL/hr  Diet Order:  Diet Heart Room service appropriate? Yes; Fluid consistency: Thin  Skin:  Reviewed, no issues  Last BM:  12/19/2016  Height:   Ht Readings from Last 1 Encounters:  12/21/16 5\' 3"  (1.6 m)    Weight:   Wt Readings from Last 1 Encounters:  12/21/16 124 lb 12.8 oz (56.6 kg)    Ideal Body Weight:   52.27 kg  BMI:  Body mass index is 22.11 kg/m.  Estimated Nutritional Needs:   Kcal:  1400-1700 calories  Protein:  56-68 gm  Fluid:  >/= 1.4L  EDUCATION NEEDS:   No education needs identified at this time  Satira Anis. Katrina Navia, MS, RD LDN Inpatient Clinical Dietitian Pager [phone]

## 2016-12-22 NOTE — Progress Notes (Signed)
Pt is oriented to unit. VSS. SOB when walking to restroom. Otherwise O2 sats at 97% on 1 L at rest. Standby assist. Pt says she feels constipated, MD notified and new orders received. Pt is alert and oriented X4 but weak.

## 2016-12-22 NOTE — Progress Notes (Signed)
[facility] [place] NAME: [name]    MR#:  [id number]  DATE OF BIRTH:  Oct 27, 1925  SUBJECTIVE:    REVIEW OF SYSTEMS:   Review of Systems  Constitutional: Negative for chills, fever and weight loss.  HENT: Negative for ear discharge, ear pain and nosebleeds.   Eyes: Negative for blurred vision, pain and discharge.  Respiratory: Positive for sputum production and shortness of breath. Negative for wheezing and stridor.   Cardiovascular: Negative for chest pain, palpitations, orthopnea and PND.  Gastrointestinal: Negative for abdominal pain, diarrhea, nausea and vomiting.  Genitourinary: Negative for frequency and urgency.  Musculoskeletal: Negative for back pain and joint pain.  Neurological: Positive for weakness. Negative for sensory change, speech change and focal weakness.  Psychiatric/Behavioral: Negative for depression and hallucinations. The patient is not nervous/anxious.    Tolerating Diet:yes Tolerating PT: no PT  DRUG ALLERGIES:   Allergies  Allergen Reactions  . Codeine Nausea And Vomiting    VITALS:  Blood pressure (!) 131/50, pulse 65, temperature 97.8 F (36.6 C), temperature source Oral, resp. rate 18, height 5\' 3"  (1.6 m), weight 56.6 kg (124 lb 12.8 oz), SpO2 94 %.  PHYSICAL EXAMINATION:   Physical Exam   GENERAL:  83 y.o.-year-old patient lying in the bed with no acute distress.  EYES: Pupils equal, round, reactive to light and accommodation. No scleral icterus. Extraocular muscles intact.  HEENT: Head atraumatic, normocephalic. Oropharynx and nasopharynx clear.  NECK:  Supple, no jugular venous distention. No thyroid enlargement, no tenderness.  LUNGS: Distant breath sounds bilaterally, no wheezing, rales, rhonchi. No use of accessory muscles of respiration.  CARDIOVASCULAR: S1, S2 normal. No murmurs, rubs, or gallops.  ABDOMEN: Soft, nontender, nondistended. Bowel sounds present. No  organomegaly or mass.  EXTREMITIES: No cyanosis, clubbing or edema b/l.    NEUROLOGIC: Cranial nerves II through XII are intact. No focal Motor or sensory deficits b/l.   PSYCHIATRIC:  patient is alert and oriented x 3.  SKIN: No obvious rash, lesion, or ulcer.   LABORATORY PANEL:  CBC  Recent Labs Lab 12/22/16 0513  WBC 8.2  HGB 13.2  HCT 39.3  PLT 147*    Chemistries   Recent Labs Lab 12/21/16 1628 12/22/16 0513  NA 134* 135  K 4.0 4.2  CL 99* 103  CO2 22 22  GLUCOSE 128* 171*  BUN 26* 26*  CREATININE 1.52* 1.25*  CALCIUM 8.5* 8.0*  MG 1.9  --    Cardiac Enzymes  Recent Labs Lab 12/22/16 0940  TROPONINI <0.03   RADIOLOGY:  Dg Chest 2 View  Result Date: 12/21/2016 CLINICAL DATA:  Cough and weakness for 3 weeks. Acute shortness of breath. Assess for pneumonia. EXAM: CHEST  2 VIEW COMPARISON:  Chest radiograph December 08, 2016 FINDINGS: Cardiac silhouette is normal. Mildly calcified aortic knob. Moderate hiatal hernia better seen on prior radiograph. Similar hyperinflation without focal consolidation or pleural effusion. No pneumothorax. Status post median sternotomy for CABG. Mild degenerative change of the thoracic spine. IMPRESSION: Stable examination:  No acute cardiopulmonary process. Moderate hiatal hernia. Electronically Signed   By: Elon Alas M.D.   On: 12/21/2016 18:32   ASSESSMENT AND PLAN:  [name]  is a 83 y.o. female with a known history of CAD status post CABG several years ago, diverticulitis, GERD, hypertension, history of C. difficile colitis, COPD not on home oxygen presents to hospital secondary to worsening shortness of breath and weakness for 3 days now.  #  1 COPD exacerbation with acute bronchitis -started on steroids, and duo nebs -Oxygen as tolerated. Check ambulatory sats prior to discharge. -Chest tightness secondary to COPD -Start cough medications. -Azithromycin for bronchitis  #2 CAD status post CABG-stable at this  time.  -Continue aspirin and Plavix.  #3 hypertension-patient on losartan, Norvasc  #4 GERD-continue PPI  #5 hyperlipidemia-on simvastatin  #6 DVT prophylaxis-Lovenox  Physical therapy recommends no PT needed  Case discussed with Care Management/Social Worker. Management plans discussed with the patient, family and they are in agreement.  CODE STATUS: Full DVT Prophylaxis: lovenox TOTAL TIME TAKING CARE OF THIS PATIENT: 30 minutes.  >50% time spent on counselling and coordination of care  POSSIBLE D/C IN 1 DAYS, DEPENDING ON CLINICAL CONDITION.  Note: This dictation was prepared with Dragon dictation along with smaller phrase technology. Any transcriptional errors that result from this process are unintentional.  [name] M.D on 12/22/2016 at 12:17 PM  Between 7am to 6pm - Pager - [phone]  After 6pm go to [email or web address] - password [email or web address] [facility]  Office  [phone]  CC: Primary care physician; [name], MD

## 2016-12-23 ENCOUNTER — Telehealth: Payer: Self-pay | Admitting: Internal Medicine

## 2016-12-23 MED ORDER — SIMVASTATIN 20 MG PO TABS: 20.0000 mg | ORAL_TABLET | Freq: Every day | ORAL | 0 refills | Status: DC

## 2016-12-23 MED ORDER — IPRATROPIUM-ALBUTEROL 0.5-2.5 (3) MG/3ML IN SOLN: 3.0000 mL | Freq: Three times a day (TID) | RESPIRATORY_TRACT | 0 refills | Status: DC

## 2016-12-23 MED ORDER — BENZONATATE 100 MG PO CAPS: 100.0000 mg | ORAL_CAPSULE | Freq: Three times a day (TID) | ORAL | 0 refills | Status: DC

## 2016-12-23 MED ORDER — AZITHROMYCIN 250 MG PO TABS: ORAL_TABLET | ORAL | 0 refills | Status: DC

## 2016-12-23 MED ORDER — PREDNISONE 10 MG PO TABS: ORAL_TABLET | ORAL | 0 refills | Status: DC

## 2016-12-23 MED ORDER — ENSURE ENLIVE PO LIQD: 237.0000 mL | Freq: Two times a day (BID) | ORAL | 12 refills | Status: DC

## 2016-12-23 NOTE — Progress Notes (Signed)
Patient was discharged home with family. IV removed with cath intact. Neb machine in room. Reviewed meds and last dose given. Scripts sent to pharmacy.

## 2016-12-23 NOTE — Care Management Note (Signed)
Case Management Note  Patient Details  Name: [name] MRN: [id number] Date of Birth: [birth year]  Subjective/Objective:  Met with patient at bedside to discuss discharge planning.  I initially thought it would be in the patients best interest to have a nurse follow up with her at discharge but she stated she golfs and drives and is very active. Not home bound. Ordered a nebulizer from [facility]. Case Closed              Action/Plan:   Expected Discharge Date:  12/23/16               Expected Discharge Plan:  Home/Self Care  In-House Referral:     Discharge planning Services  CM Consult, Homebound not met per provider  Post Acute Care Choice:  Durable Medical Equipment Choice offered to:     DME Arranged:  Nebulizer machine DME Agency:  [place]:    [facility] Agency:     Status of Service:  Completed, signed off  If discussed at [place] of Stay Meetings, dates discussed:    Additional Comments:  Jolly Mango, RN 12/23/2016, 9:33 AM

## 2016-12-23 NOTE — Telephone Encounter (Signed)
HFU,[name] [phone] called from [facility] pt is being discharged today. No appt avail to sch. Pt needs to be seen in 1 week. Please call [name] once appt has been scheduled. Thank you!

## 2016-12-23 NOTE — Discharge Instructions (Signed)
Use nebulizer as instructed

## 2016-12-23 NOTE — Discharge Summary (Signed)
[facility] NAME: [name]    MR#:  [id number]  DATE OF BIRTH:  [birth year]  DATE OF ADMISSION:  12/21/2016 ADMITTING PHYSICIAN: Gladstone Lighter, MD  DATE OF DISCHARGE: 12/23/16  PRIMARY CARE PHYSICIAN: [name], MD    ADMISSION DIAGNOSIS:  COPD exacerbation ([place]) [J44.1] Acute respiratory failure with hypoxia ([place]) [J96.01]  DISCHARGE DIAGNOSIS:  Acute on chronic COPD exacerbation Acute Bronchitis SECONDARY DIAGNOSIS:   Past Medical History:  Diagnosis Date  . C. difficile colitis   . CAD (coronary artery disease)    a. Nuclear 10/11, not gated, no scar or ischemia, EF 68%, low risk study; b. [place] 06/18/14: no evidence of infarction or ischemia, no WMA, normalization of TWI in precordial leads, equivocal EKG changes EF 77%, low risk study  . Carotid artery disease ([place])    Doppler January, 2012, 40-59% bilateral, followup one year  . Diffuse cystic mastopathy   . Diverticulitis    last colonoscopy  incomplete Jan 2011  . Dizziness    stable now (Oct 2011) patient tells me question of TIA, we will obtain records  . Gait abnormality    Patient complains of "wobbly gait", December, 2013  . GERD (gastroesophageal reflux disease)   . History of migraines   . Hx of CABG    a. 3 vessel in 1999  . Hyperlipidemia   . Hypertension   . Indigestion    3 weeks, October 2011  . Kidney stones   . Rheumatic fever   . Sinus bradycardia    Mild, December, 2013  . SOB (shortness of breath)   . Syncopal episodes    after 18 holes of golf and increase hydrochlorothiazide, resolved     HOSPITAL COURSE:   Katrina McPhersonis a 83 y.o. femalewith a known history of CAD status post CABG several years ago, diverticulitis, GERD, hypertension, history of C. difficile colitis, COPD not on home oxygen presents to hospital secondary to worsening shortness of breath and weakness for 3 days now.  #1 COPD  exacerbation with acute bronchitis - on steroids, and duo nebs -Oxygen as tolerated. sats >92% on RA -prn cough medications. -Azithromycin for bronchitis  #2 CAD status post CABG-stable at this time.  -Continue aspirin and Plavix.  #3 hypertension-patient on losartan, Norvasc  #4 GERD-continue PPI  #5 hyperlipidemia-on simvastatin  #6 DVT prophylaxis-Lovenox  Physical therapy recommends no PT needed  D/c home CONSULTS OBTAINED:    DRUG ALLERGIES:   Allergies  Allergen Reactions  . Codeine Nausea And Vomiting    DISCHARGE MEDICATIONS:   Current Discharge Medication List    START taking these medications   Details  azithromycin (ZITHROMAX) 250 MG tablet Take 1 tab daily Qty: 4 each, Refills: 0    benzonatate (TESSALON) 100 MG capsule Take 1 capsule (100 mg total) by mouth 3 (three) times daily. Qty: 20 capsule, Refills: 0    feeding supplement, ENSURE ENLIVE, (ENSURE ENLIVE) LIQD Take 237 mLs by mouth 2 (two) times daily between meals. Qty: 237 mL, Refills: 12    ipratropium-albuterol (DUONEB) 0.5-2.5 (3) MG/3ML SOLN Take 3 mLs by nebulization 3 (three) times daily. Qty: 360 mL, Refills: 0    predniSONE (DELTASONE) 10 MG tablet Take 50 mg daily taper by 10 mg daily then stop Qty: 15 tablet, Refills: 0      CONTINUE these medications which have CHANGED   Details  simvastatin (ZOCOR) 20 MG tablet Take 1 tablet (20 mg  total) by mouth at bedtime. Qty: 30 tablet, Refills: 0      CONTINUE these medications which have NOT CHANGED   Details  amLODipine (NORVASC) 2.5 MG tablet TAKE 1 TABLET EVERY DAY Qty: 90 tablet, Refills: 1    aspirin 81 MG tablet Take 81 mg by mouth daily.      chlorpheniramine-HYDROcodone (TUSSIONEX PENNKINETIC ER) 10-8 MG/5ML SUER Take 5 mLs by mouth every 12 (twelve) hours as needed for cough. Qty: 140 mL, Refills: 0    clopidogrel (PLAVIX) 75 MG tablet TAKE 1 TABLET BY MOUTH ONE TIME DAILY Qty: 90 tablet, Refills: 0     esomeprazole (NEXIUM) 40 MG capsule TAKE 1 CAPSULE DAILY BEFORE BREAKFAST Qty: 90 capsule, Refills: 1    losartan (COZAAR) 100 MG tablet TAKE 1 TABLET EVERY DAY Qty: 90 tablet, Refills: 1    nitroGLYCERIN (NITROSTAT) 0.4 MG SL tablet Place 1 tablet (0.4 mg total) under the tongue every 5 (five) minutes as needed for chest pain. Qty: 25 tablet, Refills: 6    ALPRAZolam (XANAX) 0.25 MG tablet 1 to 2 tablets at bedtime as needed for insomnia Qty: 60 tablet, Refills: 3    Lactulose 20 GM/30ML SOLN 30 ml every 4 hours until constipation is relieved Qty: 236 mL, Refills: 3    temazepam (RESTORIL) 15 MG capsule TAKE ONE CAPSULE AT BEDTIME IF NEEDED FOR SLEEP. MAY REPEAT IN ONE HOUR. Qty: 60 capsule, Refills: 2      STOP taking these medications     Ipratropium-Albuterol (COMBIVENT RESPIMAT) 20-100 MCG/ACT AERS respimat         If you experience worsening of your admission symptoms, develop shortness of breath, life threatening emergency, suicidal or homicidal thoughts you must seek medical attention immediately by calling 911 or calling your MD immediately  if symptoms less severe.  You Must read complete instructions/literature along with all the possible adverse reactions/side effects for all the Medicines you take and that have been prescribed to you. Take any new Medicines after you have completely understood and accept all the possible adverse reactions/side effects.   Please note  You were cared for by a hospitalist during your hospital stay. If you have any questions about your discharge medications or the care you received while you were in the hospital after you are discharged, you can call the unit and asked to speak with the hospitalist on call if the hospitalist that took care of you is not available. Once you are discharged, your primary care physician will handle any further medical issues. Please note that NO REFILLS for any discharge medications will be authorized once you  are discharged, as it is imperative that you return to your primary care physician (or establish a relationship with a primary care physician if you do not have one) for your aftercare needs so that they can reassess your need for medications and monitor your lab values. Today   SUBJECTIVE   Mild cough  VITAL SIGNS:  Blood pressure (!) 125/52, pulse 84, temperature 98.4 F (36.9 C), temperature source Oral, resp. rate 18, height 5\' 3"  (1.6 m), weight 56.6 kg (124 lb 12.8 oz), SpO2 92 %.  I/O:   Intake/Output Summary (Last 24 hours) at 12/23/16 0749 Last data filed at 12/22/16 2200  Gross per 24 hour  Intake              603 ml  Output                0 ml  Net              603 ml    PHYSICAL EXAMINATION:  GENERAL:  83 y.o.-year-old patient lying in the bed with no acute distress.  EYES: Pupils equal, round, reactive to light and accommodation. No scleral icterus. Extraocular muscles intact.  HEENT: Head atraumatic, normocephalic. Oropharynx and nasopharynx clear.  NECK:  Supple, no jugular venous distention. No thyroid enlargement, no tenderness.  LUNGS: Normal breath sounds bilaterally, no wheezing, rales,rhonchi or crepitation. No use of accessory muscles of respiration.  CARDIOVASCULAR: S1, S2 normal. No murmurs, rubs, or gallops.  ABDOMEN: Soft, non-tender, non-distended. Bowel sounds present. No organomegaly or mass.  EXTREMITIES: No pedal edema, cyanosis, or clubbing.  NEUROLOGIC: Cranial nerves II through XII are intact. Muscle strength 5/5 in all extremities. Sensation intact. Gait not checked.  PSYCHIATRIC: The patient is alert and oriented x 3.  SKIN: No obvious rash, lesion, or ulcer.   DATA REVIEW:   CBC   Recent Labs Lab 12/22/16 0513  WBC 8.2  HGB 13.2  HCT 39.3  PLT 147*    Chemistries   Recent Labs Lab 12/21/16 1628 12/22/16 0513  NA 134* 135  K 4.0 4.2  CL 99* 103  CO2 22 22  GLUCOSE 128* 171*  BUN 26* 26*  CREATININE 1.52* 1.25*  CALCIUM  8.5* 8.0*  MG 1.9  --     Microbiology Results   No results found for this or any previous visit (from the past 240 hour(s)).  RADIOLOGY:  Dg Chest 2 View  Result Date: 12/21/2016 CLINICAL DATA:  Cough and weakness for 3 weeks. Acute shortness of breath. Assess for pneumonia. EXAM: CHEST  2 VIEW COMPARISON:  Chest radiograph December 08, 2016 FINDINGS: Cardiac silhouette is normal. Mildly calcified aortic knob. Moderate hiatal hernia better seen on prior radiograph. Similar hyperinflation without focal consolidation or pleural effusion. No pneumothorax. Status post median sternotomy for CABG. Mild degenerative change of the thoracic spine. IMPRESSION: Stable examination:  No acute cardiopulmonary process. Moderate hiatal hernia. Electronically Signed   By: Elon Alas M.D.   On: 12/21/2016 18:32     Management plans discussed with the patient, family and they are in agreement.  CODE STATUS:     Code Status Orders        Start     Ordered   12/21/16 2200  Full code  Continuous     12/21/16 2159    Code Status History    Date Active Date Inactive Code Status Order ID Comments User Context   This patient has a current code status but no historical code status.    Advance Directive Documentation     Most Recent Value  Type of Advance Directive  Living will, Healthcare Power of Attorney  Pre-existing out of facility DNR order (yellow form or pink MOST form)  -  "MOST" Form in Place?  -      TOTAL TIME TAKING CARE OF THIS PATIENT: 40 minutes.    Nadalie Laughner M.D on 12/23/2016 at 7:49 AM  Between 7am to 6pm - Pager - [phone] After 6pm go to [email or web address] - password [email or web address] [facility]  Office  [phone]  CC: Primary care physician; [name], MD

## 2016-12-23 NOTE — Telephone Encounter (Signed)
OK to use next wednesday at 11:30 for HFU? COPD exacerbation with acute respiratory failure , and Hypoxia?

## 2016-12-23 NOTE — Telephone Encounter (Signed)
ABSOLUTELY

## 2016-12-26 NOTE — Telephone Encounter (Signed)
Transition Care Management Follow-up Telephone Call  How have you been since you were released from the hospital? Feels tired and weak.   Do you understand why you were in the hospital? Yes for coughing and weak and  Had syncope episode.  Do you understand the discharge instrcutions? Yes Items Reviewed:  Medications reviewed: Yes medication list up to date.  Allergies reviewed:yes  Dietary changes reviewed: Yes  Referrals reviewed: Yes, but no other referrals set up patient ask if she should see Dr. Leonidas Romberg?   Functional Questionnaire:   Activities of Daily Living (ADLs):   She states they are independent in the following: In all ADL's States they require assistance with the following: No assist   Any transportation issues/concerns?: No   Any patient concerns?Yes, just getting over this breathing concerns and  Medication changes patient does not understand why Hospitalist changed simvistatin to 20 mg when she was on 40 mg.   Confirmed importance and date/time of follow-up visits scheduled: Yes.   Confirmed with patient if condition begins to worsen call PCP or go to the ER.  Patient was given the Call-a-Nurse line [phone]: Yes,

## 2016-12-28 ENCOUNTER — Encounter: Payer: Self-pay | Admitting: Internal Medicine

## 2016-12-28 ENCOUNTER — Ambulatory Visit (INDEPENDENT_AMBULATORY_CARE_PROVIDER_SITE_OTHER): Payer: Medicare Other | Admitting: Internal Medicine

## 2016-12-28 ENCOUNTER — Telehealth: Payer: Self-pay | Admitting: *Deleted

## 2016-12-28 VITALS — BP 124/62 | HR 69 | Temp 97.7°F | Resp 16 | Ht 63.0 in | Wt 124.4 lb

## 2016-12-28 DIAGNOSIS — J440 Chronic obstructive pulmonary disease with acute lower respiratory infection: Principal | ICD-10-CM

## 2016-12-28 DIAGNOSIS — J441 Chronic obstructive pulmonary disease with (acute) exacerbation: Secondary | ICD-10-CM

## 2016-12-28 DIAGNOSIS — J301 Allergic rhinitis due to pollen: Secondary | ICD-10-CM | POA: Diagnosis not present

## 2016-12-28 DIAGNOSIS — R42 Dizziness and giddiness: Secondary | ICD-10-CM | POA: Diagnosis not present

## 2016-12-28 DIAGNOSIS — Z09 Encounter for follow-up examination after completed treatment for conditions other than malignant neoplasm: Secondary | ICD-10-CM

## 2016-12-28 DIAGNOSIS — J209 Acute bronchitis, unspecified: Secondary | ICD-10-CM

## 2016-12-28 MED ORDER — LORATADINE 10 MG PO TABS: 10.0000 mg | ORAL_TABLET | Freq: Every day | ORAL | 2 refills | Status: DC

## 2016-12-28 NOTE — Progress Notes (Signed)
Pre visit review using our clinic review tool, if applicable. No additional management support is needed unless otherwise documented below in the visit note. 

## 2016-12-28 NOTE — Progress Notes (Signed)
Subjective:  Patient ID: [name], female    DOB: Jun 03, 1926  Age: 83 y.o. MRN: [id number]  CC: The primary encounter diagnosis was Acute bronchitis with COPD ([place]). Diagnoses of COPD exacerbation ([place]), Dizziness and giddiness, Seasonal allergic rhinitis due to pollen, and Hospital discharge follow-up were also pertinent to this visit.  HPI [name] presents for hospiatl follow up.  She was admitted to [facility] with acute on chronic respiratory failure with hypoxia secondary to COPD exacerbation On April 4 and discharged on April 6 with azithromycin x 4 days,  Steroid taper,  Tessalon perles,  And  home nebulizer ordered.  Was kept in bed while hospitalized. Was not checked for ambulatory desaturations prior to discharge.   Still feels poorly.  "head is not right" since discharge home.  Denies diarrhea,  Dizziness,   pain, no foggy vision, lots of post nasal drip,   Clearing throuat   Not taking an anthistamine.  Appetite ok  8 lb weight loss noted since March     Outpatient Medications Prior to Visit  Medication Sig Dispense Refill  . ALPRAZolam (XANAX) 0.25 MG tablet 1 to 2 tablets at bedtime as needed for insomnia 60 tablet 3  . amLODipine (NORVASC) 2.5 MG tablet TAKE 1 TABLET EVERY DAY 90 tablet 1  . aspirin 81 MG tablet Take 81 mg by mouth daily.      . benzonatate (TESSALON) 100 MG capsule Take 1 capsule (100 mg total) by mouth 3 (three) times daily. 20 capsule 0  . clopidogrel (PLAVIX) 75 MG tablet TAKE 1 TABLET BY MOUTH ONE TIME DAILY 90 tablet 0  . esomeprazole (NEXIUM) 40 MG capsule TAKE 1 CAPSULE DAILY BEFORE BREAKFAST 90 capsule 1  . feeding supplement, ENSURE ENLIVE, (ENSURE ENLIVE) LIQD Take 237 mLs by mouth 2 (two) times daily between meals. 237 mL 12  . ipratropium-albuterol (DUONEB) 0.5-2.5 (3) MG/3ML SOLN Take 3 mLs by nebulization 3 (three) times daily. 360 mL 0  . nitroGLYCERIN (NITROSTAT) 0.4 MG SL tablet Place 1 tablet (0.4 mg total) under the tongue  every 5 (five) minutes as needed for chest pain. 25 tablet 6  . simvastatin (ZOCOR) 20 MG tablet Take 1 tablet (20 mg total) by mouth at bedtime. 30 tablet 0  . losartan (COZAAR) 100 MG tablet TAKE 1 TABLET EVERY DAY 90 tablet 1  . chlorpheniramine-HYDROcodone (TUSSIONEX PENNKINETIC ER) 10-8 MG/5ML SUER Take 5 mLs by mouth every 12 (twelve) hours as needed for cough. (Patient not taking: Reported on 12/26/2016) 140 mL 0  . azithromycin (ZITHROMAX) 250 MG tablet Take 1 tab daily (Patient not taking: Reported on 12/28/2016) 4 each 0  . Lactulose 20 GM/30ML SOLN 30 ml every 4 hours until constipation is relieved (Patient not taking: Reported on 12/08/2016) 236 mL 3  . predniSONE (DELTASONE) 10 MG tablet Take 50 mg daily taper by 10 mg daily then stop (Patient not taking: Reported on 12/28/2016) 15 tablet 0  . temazepam (RESTORIL) 15 MG capsule TAKE ONE CAPSULE AT BEDTIME IF NEEDED FOR SLEEP. MAY REPEAT IN ONE HOUR. (Patient not taking: Reported on 12/08/2016) 60 capsule 2   Facility-Administered Medications Prior to Visit  Medication Dose Route Frequency Provider Last Rate Last Dose  . Ipratropium-Albuterol (COMBIVENT) respimat 1 puff  1 puff Inhalation Q6H PRN Wilhelmina Mcardle, MD        Review of Systems;  Patient denies headache, fevers, malaise, unintentional weight loss, skin rash, eye pain, sinus congestion and sinus pain, sore throat,  dysphagia,  hemoptysis , cough, dyspnea, wheezing, chest pain, palpitations, orthopnea, edema, abdominal pain, nausea, melena, diarrhea, constipation, flank pain, dysuria, hematuria, urinary  Frequency, nocturia, numbness, tingling, seizures,  Focal weakness, Loss of consciousness,  Tremor, insomnia, depression, anxiety, and suicidal ideation.      Objective:  BP 124/62   Pulse 69   Temp 97.7 F (36.5 C) (Oral)   Resp 16   Ht 5\' 3"  (1.6 m)   Wt 124 lb 6.4 oz (56.4 kg)   SpO2 95%   BMI 22.04 kg/m   BP Readings from Last 3 Encounters:  12/28/16 124/62    12/23/16 (!) 145/71  12/08/16 112/62    Wt Readings from Last 3 Encounters:  12/28/16 124 lb 6.4 oz (56.4 kg)  12/21/16 124 lb 12.8 oz (56.6 kg)  12/08/16 131 lb 12.8 oz (59.8 kg)    General appearance: alert, cooperative and appears stated age Ears: normal TM's and external ear canals both ears Throat: lips, mucosa, and tongue normal; teeth and gums normal Neck: no adenopathy, no carotid bruit, supple, symmetrical, trachea midline and thyroid not enlarged, symmetric, no tenderness/mass/nodules Back: symmetric, no curvature. ROM normal. No CVA tenderness. Lungs: clear to auscultation bilaterally Heart: regular rate and rhythm, S1, S2 normal, no murmur, click, rub or gallop Abdomen: soft, non-tender; bowel sounds normal; no masses,  no organomegaly Pulses: 2+ and symmetric Skin: Skin color, texture, turgor normal. No rashes or lesions Lymph nodes: Cervical, supraclavicular, and axillary nodes normal.  Lab Results  Component Value Date   HGBA1C 5.8 09/26/2016    Lab Results  Component Value Date   CREATININE 1.25 (H) 12/22/2016   CREATININE 1.52 (H) 12/21/2016   CREATININE 1.17 12/08/2016    Lab Results  Component Value Date   WBC 8.2 12/22/2016   HGB 13.2 12/22/2016   HCT 39.3 12/22/2016   PLT 147 (L) 12/22/2016   GLUCOSE 171 (H) 12/22/2016   CHOL 109 12/08/2016   TRIG 122.0 12/08/2016   HDL 31.30 (L) 12/08/2016   LDLDIRECT 72.0 11/09/2015   LDLCALC 53 12/08/2016   ALT 11 12/08/2016   AST 12 12/08/2016   NA 135 12/22/2016   K 4.2 12/22/2016   CL 103 12/22/2016   CREATININE 1.25 (H) 12/22/2016   BUN 26 (H) 12/22/2016   CO2 22 12/22/2016   TSH 1.44 02/09/2015   INR 1.0 06/17/2014   HGBA1C 5.8 09/26/2016   MICROALBUR 2.5 (H) 12/26/2011    Dg Chest 2 View  Result Date: 12/21/2016 CLINICAL DATA:  Cough and weakness for 3 weeks. Acute shortness of breath. Assess for pneumonia. EXAM: CHEST  2 VIEW COMPARISON:  Chest radiograph December 08, 2016 FINDINGS: Cardiac  silhouette is normal. Mildly calcified aortic knob. Moderate hiatal hernia better seen on prior radiograph. Similar hyperinflation without focal consolidation or pleural effusion. No pneumothorax. Status post median sternotomy for CABG. Mild degenerative change of the thoracic spine. IMPRESSION: Stable examination:  No acute cardiopulmonary process. Moderate hiatal hernia. Electronically Signed   By: Elon Alas M.D.   On: 12/21/2016 18:32    Assessment & Plan:   Problem List Items Addressed This Visit    Acute bronchitis with COPD ([place]) - Primary   Relevant Medications   loratadine (CLARITIN) 10 MG tablet   Other Relevant Orders   Ambulatory referral to Pulmonology   Allergic rhinitis    claritin,  Saline irrigations for PND      COPD exacerbation ([place])    Now resolved, s/p admission for acute respiratory failure  secondary to acute bronchitis.  Her wheezing has resolved and her ambulatory sats on room air were not checked prior to discharge but were checked today and normal.      Relevant Medications   loratadine (CLARITIN) 10 MG tablet   Dizziness and giddiness    Current t symptoms ay be due to tessalon perles.. Advised to suspend the medication       Hospital discharge follow-up    Patient is stable post discharge and has no new issues or questions about discharge plans at the visit today for hospital follow up. All labs , imaging studies and progress notes from admission were reviewed with patient today           I have discontinued Ms. Lubeck's temazepam, Lactulose, predniSONE, and azithromycin. I am also having her start on loratadine. Additionally, I am having her maintain her aspirin, esomeprazole, nitroGLYCERIN, ALPRAZolam, chlorpheniramine-HYDROcodone, amLODipine, clopidogrel, simvastatin, benzonatate, feeding supplement (ENSURE ENLIVE), and ipratropium-albuterol. We will continue to administer Ipratropium-Albuterol.  Meds ordered this encounter  Medications  .  loratadine (CLARITIN) 10 MG tablet    Sig: Take 1 tablet (10 mg total) by mouth daily.    Dispense:  30 tablet    Refill:  2    Medications Discontinued During This Encounter  Medication Reason  . azithromycin (ZITHROMAX) 250 MG tablet Therapy completed  . Lactulose 20 GM/30ML SOLN Patient has not taken in last 30 days  . predniSONE (DELTASONE) 10 MG tablet Therapy completed  . temazepam (RESTORIL) 15 MG capsule Patient has not taken in last 30 days    Follow-up: Return in about 4 weeks (around 01/25/2017).   [name], MD

## 2016-12-28 NOTE — Patient Instructions (Addendum)
Stop taking the tessalon  perles ,  It may be causing your dizziness  Use your previous cough medicine  You can stop using the nebulizer every 8 hours and resume it if you start wheezing     gargle with salt water to clear throat  Flush sinuses once daily  With Milta Deiters Med's sinus rinse  Adding claritin once daily for allergies   I will make the referral to see Dr Alva Garnet

## 2016-12-28 NOTE — Telephone Encounter (Signed)
Please give a time and date to place pt for a 1 month follow up Pt contact [phone]

## 2016-12-28 NOTE — Telephone Encounter (Signed)
Is it ok to schedule pt for May 14th @ 3:30pm for a 1 month follow up?

## 2016-12-28 NOTE — Telephone Encounter (Signed)
yes

## 2016-12-29 NOTE — Telephone Encounter (Signed)
Can you schedule this for me please. Pt has not been notified. I will call pt once appt has been scheduled. Thank you very much.

## 2016-12-30 ENCOUNTER — Other Ambulatory Visit: Payer: Self-pay | Admitting: Internal Medicine

## 2016-12-30 NOTE — Telephone Encounter (Signed)
Ok to refill. Sent. 

## 2016-12-30 NOTE — Telephone Encounter (Signed)
Pt is aware of appt date and time. 

## 2016-12-30 NOTE — Telephone Encounter (Signed)
Refilled: 07/12/2016 Last OV: 12/28/2016 Next OV: 01/30/2017 Last Labs: 12/22/2016  But the bmet was abnormal so wanted to check before sending medication.

## 2016-12-30 NOTE — Telephone Encounter (Signed)
Pt appt has been scheduled. Thank you!

## 2016-12-31 DIAGNOSIS — J309 Allergic rhinitis, unspecified: Secondary | ICD-10-CM | POA: Insufficient documentation

## 2016-12-31 DIAGNOSIS — Z09 Encounter for follow-up examination after completed treatment for conditions other than malignant neoplasm: Secondary | ICD-10-CM | POA: Insufficient documentation

## 2016-12-31 NOTE — Assessment & Plan Note (Signed)
claritin,  Saline irrigations for PND

## 2016-12-31 NOTE — Assessment & Plan Note (Signed)
Patient is stable post discharge and has no new issues or questions about discharge plans at the visit today for hospital follow up. All labs , imaging studies and progress notes from admission were reviewed with patient today   

## 2016-12-31 NOTE — Assessment & Plan Note (Signed)
Now resolved, s/p admission for acute respiratory failure secondary to acute bronchitis.  Her wheezing has resolved and her ambulatory sats on room air were not checked prior to discharge but were checked today and normal.

## 2016-12-31 NOTE — Assessment & Plan Note (Signed)
Current t symptoms ay be due to tessalon perles.. Advised to suspend the medication

## 2017-01-03 ENCOUNTER — Ambulatory Visit
Admission: RE | Admit: 2017-01-03 | Discharge: 2017-01-03 | Disposition: A | Discharge status: Home / Self Care | Payer: Medicare Other | Source: Ambulatory Visit | Attending: General Surgery | Admitting: General Surgery

## 2017-01-03 DIAGNOSIS — Z1231 Encounter for screening mammogram for malignant neoplasm of breast: Secondary | ICD-10-CM | POA: Insufficient documentation

## 2017-01-06 ENCOUNTER — Telehealth: Payer: Self-pay | Admitting: Pulmonary Disease

## 2017-01-06 NOTE — Telephone Encounter (Signed)
Please call pt and schedule her for 01/25/17 @ 10:15am with [place]. Thanks

## 2017-01-06 NOTE — Telephone Encounter (Signed)
Patient in wq to schedule appt acute bronchitis.  Patient scheduled for 6/11/  Wants sooner .  Wants to be called on Monday she will be out today.

## 2017-01-12 ENCOUNTER — Ambulatory Visit (INDEPENDENT_AMBULATORY_CARE_PROVIDER_SITE_OTHER): Payer: Medicare Other | Admitting: General Surgery

## 2017-01-12 ENCOUNTER — Encounter: Payer: Self-pay | Admitting: General Surgery

## 2017-01-12 VITALS — BP 114/62 | HR 84 | Resp 14 | Ht 59.0 in | Wt 129.0 lb

## 2017-01-12 DIAGNOSIS — Z87898 Personal history of other specified conditions: Secondary | ICD-10-CM

## 2017-01-12 DIAGNOSIS — N6011 Diffuse cystic mastopathy of right breast: Secondary | ICD-10-CM | POA: Diagnosis not present

## 2017-01-12 NOTE — Patient Instructions (Signed)
Patient to return to her PCP for breast checks , no mammogram needed.

## 2017-01-12 NOTE — Progress Notes (Signed)
Patient ID: [name], female   DOB: 04-08-1926, 83 y.o.   MRN: [id number]  Chief Complaint  Patient presents with  . Follow-up    HPI [name] is a 83 y.o. female who presents for a breast evaluation. The most recent mammogram was done on 01/03/2017 .  Patient does perform regular self breast checks and gets regular mammograms done. Patient was seen in the ER on 12/21/2016 for shortness of breath, she passed out while getting up from the couch. Spent a few nights at the hospital with acute bronchitis and COPD exacerbation.   HPI  Past Medical History:  Diagnosis Date  . C. difficile colitis   . CAD (coronary artery disease)    a. Nuclear 10/11, not gated, no scar or ischemia, EF 68%, low risk study; b. [place] 06/18/14: no evidence of infarction or ischemia, no WMA, normalization of TWI in precordial leads, equivocal EKG changes EF 77%, low risk study  . Carotid artery disease ([place])    Doppler January, 2012, 40-59% bilateral, followup one year  . Diffuse cystic mastopathy   . Diverticulitis    last colonoscopy  incomplete Jan 2011  . Dizziness    stable now (Oct 2011) patient tells me question of TIA, we will obtain records  . Gait abnormality    Patient complains of "wobbly gait", December, 2013  . GERD (gastroesophageal reflux disease)   . History of migraines   . Hx of CABG    a. 3 vessel in 1999  . Hyperlipidemia   . Hypertension   . Indigestion    3 weeks, October 2011  . Kidney stones   . Rheumatic fever   . Sinus bradycardia    Mild, December, 2013  . SOB (shortness of breath)   . Syncopal episodes    after 18 holes of golf and increase hydrochlorothiazide, resolved     Past Surgical History:  Procedure Laterality Date  . APPENDECTOMY  1950  . BREAST BIOPSY Right 1994  . BREAST CYST ASPIRATION     multiple BIL  . CATARACT EXTRACTION Right 2009  . CATARACT EXTRACTION Left 2011  . COLONOSCOPY  [id number]   Dr. Jamal Collin  . CORONARY ARTERY  BYPASS GRAFT  1999  . esophagus stretched    . TONSILLECTOMY  1939  . TOTAL ABDOMINAL HYSTERECTOMY  1968   due to metrorrhagia    Family History  Problem Relation Age of Onset  . Heart disease Mother   . Heart disease Father   . Cancer Neg Hx   . Drug abuse Neg Hx   . Breast cancer Neg Hx     Social History Social History  Substance Use Topics  . Smoking status: Former Smoker    Packs/day: 0.50    Years: 40.00    Types: Cigarettes    Quit date: 09/20/1983  . Smokeless tobacco: Never Used  . Alcohol use Yes     Comment: yes, social wine    Allergies  Allergen Reactions  . Codeine Nausea And Vomiting    Current Outpatient Prescriptions  Medication Sig Dispense Refill  . ALPRAZolam (XANAX) 0.25 MG tablet 1 to 2 tablets at bedtime as needed for insomnia 60 tablet 3  . amLODipine (NORVASC) 2.5 MG tablet TAKE 1 TABLET EVERY DAY 90 tablet 1  . aspirin 81 MG tablet Take 81 mg by mouth daily.      . benzonatate (TESSALON) 100 MG capsule Take 1 capsule (100 mg total) by mouth 3 (three) times  daily. 20 capsule 0  . chlorpheniramine-HYDROcodone (TUSSIONEX PENNKINETIC ER) 10-8 MG/5ML SUER Take 5 mLs by mouth every 12 (twelve) hours as needed for cough. 140 mL 0  . clopidogrel (PLAVIX) 75 MG tablet TAKE 1 TABLET BY MOUTH ONE TIME DAILY 90 tablet 0  . esomeprazole (NEXIUM) 40 MG capsule TAKE 1 CAPSULE DAILY BEFORE BREAKFAST 90 capsule 1  . feeding supplement, ENSURE ENLIVE, (ENSURE ENLIVE) LIQD Take 237 mLs by mouth 2 (two) times daily between meals. 237 mL 12  . ipratropium-albuterol (DUONEB) 0.5-2.5 (3) MG/3ML SOLN Take 3 mLs by nebulization 3 (three) times daily. 360 mL 0  . loratadine (CLARITIN) 10 MG tablet Take 1 tablet (10 mg total) by mouth daily. 30 tablet 2  . losartan (COZAAR) 100 MG tablet TAKE 1 TABLET EVERY DAY 90 tablet 0  . nitroGLYCERIN (NITROSTAT) 0.4 MG SL tablet Place 1 tablet (0.4 mg total) under the tongue every 5 (five) minutes as needed for chest pain. 25 tablet  6  . simvastatin (ZOCOR) 20 MG tablet Take 1 tablet (20 mg total) by mouth at bedtime. 30 tablet 0   Current Facility-Administered Medications  Medication Dose Route Frequency Provider Last Rate Last Dose  . Ipratropium-Albuterol (COMBIVENT) respimat 1 puff  1 puff Inhalation Q6H PRN Wilhelmina Mcardle, MD        Review of Systems Review of Systems  Constitutional: Negative.   Respiratory: Positive for shortness of breath.   Cardiovascular: Negative.     Blood pressure 114/62, pulse 84, resp. rate 14, height 4\' 11"  (1.499 m), weight 129 lb (58.5 kg), SpO2 98 %.  Physical Exam Physical Exam  Constitutional: She is oriented to person, place, and time. She appears well-developed and well-nourished.  Eyes: Conjunctivae are normal. No scleral icterus.  Neck: Neck supple.  Cardiovascular: Normal rate, regular rhythm and normal heart sounds.   Pulmonary/Chest: Effort normal and breath sounds normal. Right breast exhibits no inverted nipple, no mass, no nipple discharge, no skin change and no tenderness. Left breast exhibits no inverted nipple, no mass, no nipple discharge, no skin change and no tenderness.  Lymphadenopathy:    She has no cervical adenopathy.    She has no axillary adenopathy.  Neurological: She is alert and oriented to person, place, and time.  Skin: Skin is warm and dry.    Data Reviewed Mammogram reviewed - stable ER notes reviewed - dc diagnosis was acute bronchitis and COPD exacerbation. Was given antibiotics. Lungs sounded okay today.   Assessment    Stable exam. History of FCD.       Plan    Patient to return to her PCP for breast checks , no mammogram needed.     HPI, Physical Exam, Assessment and Plan have been scribed under the direction and in the presence of Mckinley Jewel, MD  [name], CMA   I have completed the exam and reviewed the above documentation for accuracy and completeness.  I agree with the above.  Haematologist has been used and  any errors in dictation or transcription are unintentional.  Orvile Corona G. Jamal Collin, M.D., F.A.C.S.  Junie Panning G 01/12/2017, 4:54 PM

## 2017-01-14 NOTE — Progress Notes (Signed)
Cardiology Office Note  Date:  01/16/2017   ID:  SIMAR POTHIER, DOB [birth year], MRN [id number]  PCP:  [name], MD   Chief Complaint  Patient presents with  . other    6 month follow up. Patient was in the hospital 12/21/16. Pt states she is doing well. Reviewed meds with pt verbally.    HPI:  Ms. Ehrsam is a pleasant 83 year old woman with a history of  CAD, bypass surgery in 1999,Stress test October 2017 with no ischemia Smoking for 40 years who quit in 1985,  COPD,  chronic renal insufficiency,  hypertension,  hyperlipidemia,  40-50% bilateral carotid arterial disease  EF >60% in 05/2015  C. difficile in January 2015. who presents for routine followup of her coronary artery disease  Hospital admission 12/21/2016 for COPD exacerbation Syncope, dehydrated Hospital records reviewed with the patient in detail She received steroids,duo nebs, ABX  In follow-up today she reports that she is doing well, Still with some fatigue but slowly getting better Continues to play golf Denies any significant cough  Lab work reviewed with her, LDL 72 Simvastatin dose was decreased in the hospital for unclear reasons, down to 20 She is requesting to go back on the 40 mg dose  EKG personally reviewed by myself on todays visit Shows normal sinus rhythm with rate 71 bpm T-wave abnormality in V1 through V4 1 and aVL, no change from prior EKGs  Other past medical history reviewed On previous office visits, reported having left-sided chest pain and shoulder pain Declined stress testing or catheterization  at that time.  On last clinic visit had chest pain, took aspirin and nitroglycerin, after several hours symptoms resolved.   She has not had any intervention since her bypass surgery. No cardiac catheterizations, no stent placement.  C. difficile in January 2015. She was in the hospital for several days.  Last stress test was October 2011 and September 2015  Carotid  ultrasound showing 40-59% disease   PMH:   has a past medical history of C. difficile colitis; CAD (coronary artery disease); Carotid artery disease ([place]); Diffuse cystic mastopathy; Diverticulitis; Dizziness; Gait abnormality; GERD (gastroesophageal reflux disease); History of migraines; CABG; Hyperlipidemia; Hypertension; Indigestion; Kidney stones; Rheumatic fever; Sinus bradycardia; SOB (shortness of breath); and Syncopal episodes.  PSH:    Past Surgical History:  Procedure Laterality Date  . APPENDECTOMY  1950  . BREAST BIOPSY Right 1994  . BREAST CYST ASPIRATION     multiple BIL  . CATARACT EXTRACTION Right 2009  . CATARACT EXTRACTION Left 2011  . COLONOSCOPY  [id number]   Dr. Jamal Collin  . CORONARY ARTERY BYPASS GRAFT  1999  . esophagus stretched    . TONSILLECTOMY  1939  . TOTAL ABDOMINAL HYSTERECTOMY  1968   due to metrorrhagia    Current Outpatient Prescriptions  Medication Sig Dispense Refill  . ALPRAZolam (XANAX) 0.25 MG tablet 1 to 2 tablets at bedtime as needed for insomnia 60 tablet 3  . amLODipine (NORVASC) 2.5 MG tablet TAKE 1 TABLET EVERY DAY 90 tablet 1  . aspirin 81 MG tablet Take 81 mg by mouth daily.      . clopidogrel (PLAVIX) 75 MG tablet TAKE 1 TABLET BY MOUTH ONE TIME DAILY 90 tablet 0  . esomeprazole (NEXIUM) 40 MG capsule TAKE 1 CAPSULE DAILY BEFORE BREAKFAST 90 capsule 1  . losartan (COZAAR) 100 MG tablet TAKE 1 TABLET EVERY DAY 90 tablet 0  . nitroGLYCERIN (NITROSTAT) 0.4 MG SL tablet Place 1 tablet (0.4  mg total) under the tongue every 5 (five) minutes as needed for chest pain. 25 tablet 6  . simvastatin (ZOCOR) 40 MG tablet Take 1 tablet (40 mg total) by mouth at bedtime. 90 tablet 3   No current facility-administered medications for this visit.      Allergies:   Codeine   Social History:  The patient  reports that she quit smoking about 33 years ago. Her smoking use included Cigarettes. She has a 20.00 pack-year smoking history. She has never used  smokeless tobacco. She reports that she drinks alcohol. She reports that she does not use drugs.   Family History:   family history includes Heart disease in her father and mother.    Review of Systems: Review of Systems  Respiratory: Negative.   Cardiovascular: Negative.   Gastrointestinal: Negative.   Musculoskeletal: Negative.   Neurological: Positive for weakness.  Psychiatric/Behavioral: Negative.   All other systems reviewed and are negative.    PHYSICAL EXAM: VS:  BP 130/64 (BP Location: Left Arm, Patient Position: Sitting, Cuff Size: Normal)   Pulse 71   Ht 5' 3.5" (1.613 m)   Wt 131 lb 8 oz (59.6 kg)   BMI 22.93 kg/m  , BMI Body mass index is 22.93 kg/m. GEN: Well nourished, well developed, in no acute distress  HEENT: normal  Neck: no JVD, carotid bruits, or masses Cardiac: RRR; no murmurs, rubs, or gallops,no edema  Respiratory:  clear to auscultation bilaterally, normal work of breathing GI: soft, nontender, nondistended, + BS MS: no deformity or atrophy  Skin: warm and dry, no rash Neuro:  Strength and sensation are intact Psych: euthymic mood, full affect    Recent Labs: 12/08/2016: ALT 11 12/21/2016: Magnesium 1.9 12/22/2016: BUN 26; Creatinine, Ser 1.25; Hemoglobin 13.2; Platelets 147; Potassium 4.2; Sodium 135    Lipid Panel Lab Results  Component Value Date   CHOL 109 12/08/2016   HDL 31.30 (L) 12/08/2016   LDLCALC 53 12/08/2016   TRIG 122.0 12/08/2016      Wt Readings from Last 3 Encounters:  01/16/17 131 lb 8 oz (59.6 kg)  01/12/17 129 lb (58.5 kg)  12/28/16 124 lb 6.4 oz (56.4 kg)       ASSESSMENT AND PLAN:   Essential hypertension - Plan: EKG 12-Lead, NM Myocar Multi W/Spect W/Wall Motion / EF Blood pressure is well controlled on today's visit. No changes made to the medications.  Coronary artery disease involving native coronary artery of native heart without angina pectoris -  Previous stress test October 2017 showing no  ischemia  History of bypass surgery Denies any anginal symptoms  Bronchitis Episode of syncope in the setting of dehydration and bronchitis Doing better, still somewhat weak   Total encounter time more than 25 minutes  Greater than 50% was spent in counseling and coordination of care with the patient   Disposition:   F/U  6 months   Orders Placed This Encounter  Procedures  . EKG 12-Lead     Signed, Esmond Plants, M.D., Ph.D. 01/16/2017  [place], [place]

## 2017-01-16 ENCOUNTER — Ambulatory Visit (INDEPENDENT_AMBULATORY_CARE_PROVIDER_SITE_OTHER): Payer: Medicare Other | Admitting: Cardiovascular Disease

## 2017-01-16 ENCOUNTER — Encounter: Payer: Self-pay | Admitting: Cardiovascular Disease

## 2017-01-16 VITALS — BP 130/64 | HR 71 | Ht 63.5 in | Wt 131.5 lb

## 2017-01-16 DIAGNOSIS — J209 Acute bronchitis, unspecified: Secondary | ICD-10-CM

## 2017-01-16 DIAGNOSIS — Z951 Presence of aortocoronary bypass graft: Secondary | ICD-10-CM | POA: Diagnosis not present

## 2017-01-16 DIAGNOSIS — I10 Essential (primary) hypertension: Secondary | ICD-10-CM | POA: Diagnosis not present

## 2017-01-16 DIAGNOSIS — N183 Chronic kidney disease, stage 3 unspecified: Secondary | ICD-10-CM

## 2017-01-16 DIAGNOSIS — E782 Mixed hyperlipidemia: Secondary | ICD-10-CM | POA: Diagnosis not present

## 2017-01-16 DIAGNOSIS — I209 Angina pectoris, unspecified: Secondary | ICD-10-CM | POA: Diagnosis not present

## 2017-01-16 DIAGNOSIS — I779 Disorder of arteries and arterioles, unspecified: Secondary | ICD-10-CM

## 2017-01-16 DIAGNOSIS — J440 Chronic obstructive pulmonary disease with acute lower respiratory infection: Secondary | ICD-10-CM

## 2017-01-16 DIAGNOSIS — I700 Atherosclerosis of aorta: Secondary | ICD-10-CM | POA: Diagnosis not present

## 2017-01-16 DIAGNOSIS — I25118 Atherosclerotic heart disease of native coronary artery with other forms of angina pectoris: Secondary | ICD-10-CM

## 2017-01-16 DIAGNOSIS — I739 Peripheral vascular disease, unspecified: Secondary | ICD-10-CM

## 2017-01-16 MED ORDER — SIMVASTATIN 40 MG PO TABS: 40.0000 mg | ORAL_TABLET | Freq: Every day | ORAL | 3 refills | Status: DC

## 2017-01-16 NOTE — Patient Instructions (Signed)

## 2017-01-19 ENCOUNTER — Other Ambulatory Visit: Payer: Medicare Other

## 2017-01-19 ENCOUNTER — Telehealth: Payer: Self-pay | Admitting: *Deleted

## 2017-01-19 DIAGNOSIS — A09 Infectious gastroenteritis and colitis, unspecified: Secondary | ICD-10-CM

## 2017-01-19 NOTE — Telephone Encounter (Signed)
Patient advised of below and verbalized understanding.  She will come by office to pick up tubes for testing.

## 2017-01-19 NOTE — Telephone Encounter (Signed)
She is definitely at high risk for recurrence.  I have ordered several stool studies, can she come by or have someone pick up the tubes from the lab?  I will be out of the office this afternoon so I cannot see her

## 2017-01-19 NOTE — Telephone Encounter (Signed)
Patient thinks that she may have C-diff, pt has symptoms if diarrhea , starting this morning at 6am. Patient requested a call to discuss care.  Pt contact [phone]

## 2017-01-19 NOTE — Telephone Encounter (Signed)
Patient states she was in hospital 12/21/16 discharged on azithromycin .   Patient thinks that she may have C-diff, pt has symptoms if diarrhea , starting this morning at 6am. Patient requested a call to discuss care.  Pt contact [phone]   She has diarrhea 5 times this am, woke up with stomach hurting across below waist.  Stool is small , does have shape. Some chills.   Able to keep liquids down.  No nausea.   She is nervous due previous history of c. Diff in the past.  Please advise.

## 2017-01-20 ENCOUNTER — Telehealth: Payer: Self-pay

## 2017-01-20 ENCOUNTER — Encounter: Payer: Self-pay | Admitting: Internal Medicine

## 2017-01-20 LAB — CLOSTRIDIUM DIFFICILE BY PCR: CDIFFPCR: NOT DETECTED

## 2017-01-20 NOTE — Telephone Encounter (Signed)
LMTCB

## 2017-01-20 NOTE — Telephone Encounter (Signed)
Patient was informed of Dr. Lupita Dawn statement , she understood and had no further questions.

## 2017-01-20 NOTE — Telephone Encounter (Signed)
-----   Message from [name], MD sent at 01/20/2017 12:55 PM EDT ----- Judi Cong news!  Your stool test was negative for c dificile..  The other stool tests are still pending,  And I will let you know either way.  Enjoy your company.   Regards,   Deborra Medina, MD

## 2017-01-23 ENCOUNTER — Encounter: Payer: Self-pay | Admitting: Internal Medicine

## 2017-01-23 LAB — STOOL CULTURE

## 2017-01-24 LAB — GASTROINTESTINAL PATHOGEN PANEL PCR
C. DIFFICILE TOX A/B, PCR: NOT DETECTED
CAMPYLOBACTER, PCR: NOT DETECTED
CRYPTOSPORIDIUM, PCR: NOT DETECTED
E COLI 0157, PCR: NOT DETECTED
E coli (ETEC) LT/ST PCR: NOT DETECTED
E coli (STEC) stx1/stx2, PCR: NOT DETECTED
GIARDIA LAMBLIA, PCR: NOT DETECTED
Norovirus, PCR: NOT DETECTED
Rotavirus A, PCR: NOT DETECTED
SHIGELLA, PCR: NOT DETECTED
Salmonella, PCR: NOT DETECTED

## 2017-01-25 ENCOUNTER — Ambulatory Visit (INDEPENDENT_AMBULATORY_CARE_PROVIDER_SITE_OTHER): Payer: Medicare Other | Admitting: Pulmonary Disease

## 2017-01-25 ENCOUNTER — Telehealth: Payer: Self-pay | Admitting: Cardiovascular Disease

## 2017-01-25 ENCOUNTER — Encounter: Payer: Self-pay | Admitting: Pulmonary Disease

## 2017-01-25 VITALS — BP 140/72 | HR 69 | Ht 63.5 in | Wt 130.0 lb

## 2017-01-25 DIAGNOSIS — Z87891 Personal history of nicotine dependence: Secondary | ICD-10-CM | POA: Diagnosis not present

## 2017-01-25 DIAGNOSIS — J449 Chronic obstructive pulmonary disease, unspecified: Secondary | ICD-10-CM

## 2017-01-25 DIAGNOSIS — I25118 Atherosclerotic heart disease of native coronary artery with other forms of angina pectoris: Secondary | ICD-10-CM

## 2017-01-25 MED ORDER — IPRATROPIUM-ALBUTEROL 20-100 MCG/ACT IN AERS: 1.0000 | INHALATION_SPRAY | Freq: Four times a day (QID) | RESPIRATORY_TRACT | 10 refills | Status: DC

## 2017-01-25 NOTE — Telephone Encounter (Signed)
Pt c/o swelling: STAT is pt has developed SOB within 24 hours  1. How long have you been experiencing swelling? For a few days  2. Where is the swelling located? feey  3.  Are you currently taking a "fluid pill? She is not on any   4.  Are you currently SOB? No   5.  Have you traveled recently? No   She states Dr Rockey Situ said if she was to have some swelling to please call us She would like to know if we can call in something to [facility] pharmacy

## 2017-01-25 NOTE — Telephone Encounter (Signed)
Spoke w/ pt.  She reports that she did not notice that she had any ankle swelling until it was pointed out at her last ov. Now she realizes that it is getting worse. She saw Dr. Alva Garnet today and her BP was up (140/72), so she was advised to call our office. She denies any SOB; she does not weigh daily.  She has been on amlodipine "forever" w/ no complications. She was advised to call back if swelling worsened or continued and that a diuretic could be sent in.  She asks that we send rx into [facility] and leave her a message on her vm if she is not home.  Advised her that I will make Dr. Rockey Situ aware and let her know of his recommendation.

## 2017-01-25 NOTE — Telephone Encounter (Signed)
Left message for pt to call back  °

## 2017-01-25 NOTE — Patient Instructions (Signed)
Continue Combivent respimat inhaler as needed for shortness of breath, cough, chest tightness or wheezing  Follow up as needed

## 2017-01-26 NOTE — Progress Notes (Signed)
PROBLEMS: Former smoker Mild COPD - very well compensated  INTERVAL HISTORY: Hospitalized 04/04-04/06/18 after syncopal event. Discharge diagnosis was COPD exacerbation. Patient was not seen by pulmonary service.  SUBJ: This is a routine re-evaluation. Her recent hospitalization is noted. Presently, she has no new complaints and is back to her usual baseline. She is no longer on the Spiriva inhaler. She is using Combivent inhaler as needed. She uses this very infrequently. She denies CP, fever, purulent sputum, hemoptysis, LE edema and calf tenderness.   OBJ: Vitals:   01/25/17 1007  BP: 140/72  Pulse: 69  SpO2: 98%  Weight: 130 lb (59 kg)  Height: 5' 3.5" (1.613 m)    Gen: WDWN in NAD HEENT: NCAT, sclerae white, oropharynx normal Neck: NO LAN, no JVD noted Lungs: Breath sounds are mildly diminished with no wheezes or other adventitious sounds Cardiovascular: Reg rate, normal rhythm, no M noted Abdomen: Soft, NT, +BS Ext: no C/C/E Neuro: PERRL, EOMI, motor/sensory grossly intact Skin: No lesions noted    DATA: CXR 12/21/16: No acute cardiac or pulmonary findings.  IMPRESSION: Mild COPD - well compensated.  Recent COPD exacerbation - resolved.  PLAN: Continue Combivent respimat inhaler as needed for shortness of breath, cough, chest tightness or wheezing  Follow up as needed  Merton Border, MD PCCM service Mobile [phone] Pager [phone] 01/26/2017 2:34 PM       Wilhelmina Mcardle, MD [facility]/CC[facility]

## 2017-01-26 NOTE — Telephone Encounter (Signed)
It is more common to have swelling in the warmer weather Also if she has been on her feet more Would try tight sock or compression hose If no relief weekend: Lasix 20 mg daily as needed.  Would take sparingly as too much could cause kidney problem Alternative would be to drink less, watch the salt if there is a concern of fluid overload

## 2017-01-27 NOTE — Telephone Encounter (Signed)
Spoke w/ pt. Advised her of Dr. Donivan Scull recommendation.  She states "I was afraid he was gonna tell me to wear those compression hose". She was trying to avoid those, but is agreeable to trying them.  Asked her to call back if they do not resolve/improve her swelling.

## 2017-01-30 ENCOUNTER — Encounter: Payer: Self-pay | Admitting: Internal Medicine

## 2017-01-30 ENCOUNTER — Ambulatory Visit (INDEPENDENT_AMBULATORY_CARE_PROVIDER_SITE_OTHER): Payer: Medicare Other | Admitting: Internal Medicine

## 2017-01-30 VITALS — BP 118/56 | HR 81 | Temp 98.1°F | Resp 16 | Ht 63.5 in | Wt 128.8 lb

## 2017-01-30 DIAGNOSIS — I779 Disorder of arteries and arterioles, unspecified: Secondary | ICD-10-CM

## 2017-01-30 DIAGNOSIS — R634 Abnormal weight loss: Secondary | ICD-10-CM

## 2017-01-30 DIAGNOSIS — Z8619 Personal history of other infectious and parasitic diseases: Secondary | ICD-10-CM

## 2017-01-30 DIAGNOSIS — I700 Atherosclerosis of aorta: Secondary | ICD-10-CM

## 2017-01-30 DIAGNOSIS — I739 Peripheral vascular disease, unspecified: Secondary | ICD-10-CM

## 2017-01-30 DIAGNOSIS — F5105 Insomnia due to other mental disorder: Secondary | ICD-10-CM | POA: Diagnosis not present

## 2017-01-30 DIAGNOSIS — F419 Anxiety disorder, unspecified: Secondary | ICD-10-CM

## 2017-01-30 DIAGNOSIS — R739 Hyperglycemia, unspecified: Secondary | ICD-10-CM | POA: Diagnosis not present

## 2017-01-30 DIAGNOSIS — I6523 Occlusion and stenosis of bilateral carotid arteries: Secondary | ICD-10-CM

## 2017-01-30 MED ORDER — ALPRAZOLAM 0.25 MG PO TABS: ORAL_TABLET | ORAL | 5 refills | Status: DC

## 2017-01-30 NOTE — Patient Instructions (Addendum)
We will repeat your carotid doppler ultrasound in July at Dr Donivan Scull office  If there has been progression,  We will upgrade your simvastatin to a higher potency statin   Try taking the alprazolam in split doses,: one before bedtime,  One if you wake up early    Try mixing Boost with ice cream .  You  Need  To increase your protein  and the calories    The ShingRx vaccine will be available in about 6 months and IS ADVISED for all interested adults over 50 to prevent shingles

## 2017-01-30 NOTE — Progress Notes (Signed)
Subjective:  Patient ID: [name], female    DOB: 1926/03/01  Age: 83 y.o. MRN: [id number]  CC: The primary encounter diagnosis was Carotid stenosis, asymptomatic, bilateral. Diagnoses of Hyperglycemia, Loss of weight, Insomnia secondary to anxiety, Bilateral carotid artery disease (HCC), Atherosclerosis of aorta ([place]), and History of Clostridium difficile colitis were also pertinent to this visit.  HPI [name] presents for follow up on general malaise and recent diarrheal episode following hospitalization for acute respiratory failure.  She was seen by pulmonology last week,  Using combivent prn. No changes made to regimen .  Diarrhea: resolved without intervetnion .  All stool studies reviewed with patient and were negative . She has a history of c dificile colitis and continues to take a daily probiotic and avoid antibioticss unless absolutely necessary  Still noting weight loss.  Poor appetite.  Played 18 holes of golf recently.    Insomnia:  Using alprazoal 0.5 mg at bedtime,       Outpatient Medications Prior to Visit  Medication Sig Dispense Refill  . amLODipine (NORVASC) 2.5 MG tablet TAKE 1 TABLET EVERY DAY 90 tablet 1  . aspirin 81 MG tablet Take 81 mg by mouth daily.      . clopidogrel (PLAVIX) 75 MG tablet TAKE 1 TABLET BY MOUTH ONE TIME DAILY 90 tablet 0  . esomeprazole (NEXIUM) 40 MG capsule TAKE 1 CAPSULE DAILY BEFORE BREAKFAST 90 capsule 1  . Ipratropium-Albuterol (COMBIVENT RESPIMAT) 20-100 MCG/ACT AERS respimat Inhale 1 puff into the lungs every 6 (six) hours. 1 Inhaler 10  . losartan (COZAAR) 100 MG tablet TAKE 1 TABLET EVERY DAY 90 tablet 0  . nitroGLYCERIN (NITROSTAT) 0.4 MG SL tablet Place 1 tablet (0.4 mg total) under the tongue every 5 (five) minutes as needed for chest pain. 25 tablet 6  . simvastatin (ZOCOR) 40 MG tablet Take 1 tablet (40 mg total) by mouth at bedtime. 90 tablet 3  . ALPRAZolam (XANAX) 0.25 MG tablet 1 to 2 tablets at bedtime  as needed for insomnia 60 tablet 3   No facility-administered medications prior to visit.     Review of Systems;  Patient denies headache, fevers, malaise, , skin rash, eye pain, sinus congestion and sinus pain, sore throat, dysphagia,  hemoptysis , cough, dyspnea, wheezing, chest pain, palpitations, orthopnea, edema, abdominal pain, nausea, melena, diarrhea, constipation, flank pain, dysuria, hematuria, urinary  Frequency, nocturia, numbness, tingling, seizures,  Focal weakness, Loss of consciousness,  Tremor, and suicidal ideation.      Objective:  BP (!) 118/56 (BP Location: Left Arm, Patient Position: Sitting, Cuff Size: Normal)   Pulse 81   Temp 98.1 F (36.7 C) (Oral)   Resp 16   Ht 5' 3.5" (1.613 m)   Wt 128 lb 12.8 oz (58.4 kg)   SpO2 95%   BMI 22.46 kg/m   BP Readings from Last 3 Encounters:  01/30/17 (!) 118/56  01/25/17 140/72  01/16/17 130/64    Wt Readings from Last 3 Encounters:  01/30/17 128 lb 12.8 oz (58.4 kg)  01/25/17 130 lb (59 kg)  01/16/17 131 lb 8 oz (59.6 kg)    General appearance: alert, cooperative and appears stated age Ears: normal TM's and external ear canals both ears Throat: lips, mucosa, and tongue normal; teeth and gums normal Neck: no adenopathy, right sided  carotid bruit, supple, symmetrical, trachea midline and thyroid not enlarged, symmetric, no tenderness/mass/nodules Back: symmetric, no curvature. ROM normal. No CVA tenderness. Lungs: clear to auscultation bilaterally  Heart: regular rate and rhythm, S1, S2 normal, no murmur, click, rub or gallop Abdomen: soft, non-tender; bowel sounds normal; no masses,  no organomegaly Pulses: 2+ and symmetric Skin: Skin color, texture, turgor normal. No rashes or lesions Lymph nodes: Cervical, supraclavicular, and axillary nodes normal.  Lab Results  Component Value Date   HGBA1C 5.8 01/30/2017   HGBA1C 5.8 09/26/2016    Lab Results  Component Value Date   CREATININE 1.55 (H) 01/30/2017     CREATININE 1.25 (H) 12/22/2016   CREATININE 1.52 (H) 12/21/2016    Lab Results  Component Value Date   WBC 8.2 12/22/2016   HGB 13.2 12/22/2016   HCT 39.3 12/22/2016   PLT 147 (L) 12/22/2016   GLUCOSE 124 (H) 01/30/2017   CHOL 109 12/08/2016   TRIG 122.0 12/08/2016   HDL 31.30 (L) 12/08/2016   LDLDIRECT 72.0 11/09/2015   LDLCALC 53 12/08/2016   ALT 11 12/08/2016   AST 12 12/08/2016   NA 141 01/30/2017   K 3.9 01/30/2017   CL 107 01/30/2017   CREATININE 1.55 (H) 01/30/2017   BUN 21 01/30/2017   CO2 25 01/30/2017   TSH 1.44 02/09/2015   INR 1.0 06/17/2014   HGBA1C 5.8 01/30/2017   MICROALBUR 2.5 (H) 12/26/2011    Mm Screening Breast Tomo Bilateral  Result Date: 01/04/2017 CLINICAL DATA:  Screening. EXAM: 2D DIGITAL SCREENING BILATERAL MAMMOGRAM WITH CAD AND ADJUNCT TOMO COMPARISON:  Previous exam(s). ACR Breast Density Category c: The breast tissue is heterogeneously dense, which may obscure small masses. FINDINGS: There are no findings suspicious for malignancy. Images were processed with CAD. IMPRESSION: No mammographic evidence of malignancy. A result letter of this screening mammogram will be mailed directly to the patient. RECOMMENDATION: Screening mammogram in one year. (Code:SM-B-01Y) BI-RADS CATEGORY  1: Negative. Electronically Signed   By: Fidela Salisbury M.D.   On: 01/04/2017 16:07    Assessment & Plan:   Problem List Items Addressed This Visit    Loss of weight    Recommended adding Boost with Ice cream daily       Insomnia secondary to anxiety    Advised to split alprazolam dose to evening and early morning if needed      Relevant Medications   ALPRAZolam (XANAX) 0.25 MG tablet   History of Clostridium difficile colitis    Continue daily probiotic and avoidance of antibitoics .  Recent episode of diarrhea was NOT C DIF       Carotid artery disease (HCC)    Last doppler was over a year ago.  Repeat due       Atherosclerosis of aorta ([place])     Noted on recent imaging.  Will increase statin dose if there has been progression of disease        Other Visit Diagnoses    Carotid stenosis, asymptomatic, bilateral    -  Primary   Relevant Orders   US Carotid Duplex Bilateral   Hyperglycemia       Relevant Orders   Basic metabolic panel (Completed)   Hemoglobin A1c (Completed)      I am having Ms. Kamps maintain her aspirin, esomeprazole, nitroGLYCERIN, amLODipine, clopidogrel, losartan, simvastatin, Ipratropium-Albuterol, and ALPRAZolam.  Meds ordered this encounter  Medications  . ALPRAZolam (XANAX) 0.25 MG tablet    Sig: 1 to 2 tablets at bedtime as needed for insomnia    Dispense:  60 tablet    Refill:  5    Medications Discontinued During This Encounter  Medication  Reason  . ALPRAZolam (XANAX) 0.25 MG tablet Reorder    Follow-up: No Follow-up on file.   [name], MD

## 2017-01-31 LAB — BASIC METABOLIC PANEL
BUN: 21 mg/dL (ref 6–23)
CHLORIDE: 107 meq/L (ref 96–112)
CO2: 25 mEq/L (ref 19–32)
Calcium: 9.1 mg/dL (ref 8.4–10.5)
Creatinine, Ser: 1.55 mg/dL — ABNORMAL HIGH (ref 0.40–1.20)
GFR: 33.5 mL/min — AB (ref >60.00)
Glucose, Bld: 124 mg/dL — ABNORMAL HIGH (ref 70–99)
POTASSIUM: 3.9 meq/L (ref 3.5–5.1)
SODIUM: 141 meq/L (ref 135–145)

## 2017-01-31 LAB — HEMOGLOBIN A1C: HEMOGLOBIN A1C: 5.8 % (ref 4.6–6.5)

## 2017-02-01 ENCOUNTER — Encounter: Payer: Self-pay | Admitting: Internal Medicine

## 2017-02-01 NOTE — Assessment & Plan Note (Signed)
Advised to split alprazolam dose to evening and early morning if needed

## 2017-02-01 NOTE — Assessment & Plan Note (Signed)
Recommended adding Boost with Ice cream daily

## 2017-02-01 NOTE — Assessment & Plan Note (Signed)
Last doppler was over a year ago.  Repeat due

## 2017-02-01 NOTE — Assessment & Plan Note (Signed)
Noted on recent imaging.  Will increase statin dose if there has been progression of disease

## 2017-02-01 NOTE — Assessment & Plan Note (Signed)
Continue daily probiotic and avoidance of antibitoics .  Recent episode of diarrhea was NOT C DIF

## 2017-02-03 NOTE — Telephone Encounter (Signed)
Mailed unread message to patient.  

## 2017-02-08 ENCOUNTER — Telehealth: Payer: Self-pay | Admitting: *Deleted

## 2017-02-08 DIAGNOSIS — D485 Neoplasm of uncertain behavior of skin: Secondary | ICD-10-CM | POA: Diagnosis not present

## 2017-02-08 DIAGNOSIS — L905 Scar conditions and fibrosis of skin: Secondary | ICD-10-CM | POA: Diagnosis not present

## 2017-02-08 DIAGNOSIS — Z08 Encounter for follow-up examination after completed treatment for malignant neoplasm: Secondary | ICD-10-CM | POA: Diagnosis not present

## 2017-02-08 DIAGNOSIS — Z85828 Personal history of other malignant neoplasm of skin: Secondary | ICD-10-CM | POA: Diagnosis not present

## 2017-02-08 DIAGNOSIS — D0471 Carcinoma in situ of skin of right lower limb, including hip: Secondary | ICD-10-CM | POA: Diagnosis not present

## 2017-02-08 DIAGNOSIS — X32XXXA Exposure to sunlight, initial encounter: Secondary | ICD-10-CM | POA: Diagnosis not present

## 2017-02-08 DIAGNOSIS — L570 Actinic keratosis: Secondary | ICD-10-CM | POA: Diagnosis not present

## 2017-02-08 NOTE — Telephone Encounter (Signed)
Patient stated that she hasn't been reach about having her charioted artery check. Patient requested a update.  Pt contact [phone]

## 2017-02-10 ENCOUNTER — Telehealth: Payer: Self-pay

## 2017-02-10 DIAGNOSIS — I6523 Occlusion and stenosis of bilateral carotid arteries: Secondary | ICD-10-CM

## 2017-02-10 NOTE — Telephone Encounter (Signed)
Do you know anything about this? 

## 2017-02-10 NOTE — Telephone Encounter (Signed)
Ok the order has been changed.Thank you so much.

## 2017-02-10 NOTE — Telephone Encounter (Signed)
-----   Message from Eustace Pen sent at 02/10/2017  1:35 PM EDT ----- Regarding: ultrasound Graylon Good, Can you change this order to [id number] please? It has to be ordered differently from the hospital...for what ever reason! Thank you, Lenna Sciara

## 2017-02-27 ENCOUNTER — Ambulatory Visit: Payer: Medicare Other | Admitting: Pulmonary Disease

## 2017-02-27 ENCOUNTER — Ambulatory Visit: Payer: Medicare Other | Admitting: Internal Medicine

## 2017-03-08 ENCOUNTER — Telehealth: Payer: Self-pay | Admitting: Internal Medicine

## 2017-03-08 ENCOUNTER — Encounter: Payer: Self-pay | Admitting: Emergency Medicine

## 2017-03-08 ENCOUNTER — Observation Stay
Admission: EM | Admit: 2017-03-08 | Discharge: 2017-03-10 | Disposition: A | Discharge status: Home Health | Payer: Medicare Other | Attending: Internal Medicine | Admitting: Internal Medicine

## 2017-03-08 ENCOUNTER — Telehealth: Payer: Self-pay | Admitting: *Deleted

## 2017-03-08 ENCOUNTER — Emergency Department: Payer: Medicare Other

## 2017-03-08 DIAGNOSIS — E785 Hyperlipidemia, unspecified: Secondary | ICD-10-CM | POA: Diagnosis not present

## 2017-03-08 DIAGNOSIS — M25511 Pain in right shoulder: Secondary | ICD-10-CM | POA: Diagnosis not present

## 2017-03-08 DIAGNOSIS — Z7982 Long term (current) use of aspirin: Secondary | ICD-10-CM | POA: Insufficient documentation

## 2017-03-08 DIAGNOSIS — S32512A Fracture of superior rim of left pubis, initial encounter for closed fracture: Secondary | ICD-10-CM | POA: Diagnosis not present

## 2017-03-08 DIAGNOSIS — Z951 Presence of aortocoronary bypass graft: Secondary | ICD-10-CM | POA: Insufficient documentation

## 2017-03-08 DIAGNOSIS — I10 Essential (primary) hypertension: Secondary | ICD-10-CM | POA: Diagnosis not present

## 2017-03-08 DIAGNOSIS — D696 Thrombocytopenia, unspecified: Secondary | ICD-10-CM | POA: Insufficient documentation

## 2017-03-08 DIAGNOSIS — I2510 Atherosclerotic heart disease of native coronary artery without angina pectoris: Secondary | ICD-10-CM | POA: Insufficient documentation

## 2017-03-08 DIAGNOSIS — Z885 Allergy status to narcotic agent status: Secondary | ICD-10-CM | POA: Insufficient documentation

## 2017-03-08 DIAGNOSIS — J449 Chronic obstructive pulmonary disease, unspecified: Secondary | ICD-10-CM | POA: Insufficient documentation

## 2017-03-08 DIAGNOSIS — S728X2A Other fracture of left femur, initial encounter for closed fracture: Secondary | ICD-10-CM | POA: Diagnosis not present

## 2017-03-08 DIAGNOSIS — I129 Hypertensive chronic kidney disease with stage 1 through stage 4 chronic kidney disease, or unspecified chronic kidney disease: Secondary | ICD-10-CM | POA: Insufficient documentation

## 2017-03-08 DIAGNOSIS — S3993XA Unspecified injury of pelvis, initial encounter: Secondary | ICD-10-CM | POA: Diagnosis not present

## 2017-03-08 DIAGNOSIS — S329XXA Fracture of unspecified parts of lumbosacral spine and pelvis, initial encounter for closed fracture: Secondary | ICD-10-CM | POA: Diagnosis not present

## 2017-03-08 DIAGNOSIS — Z7902 Long term (current) use of antithrombotics/antiplatelets: Secondary | ICD-10-CM | POA: Diagnosis not present

## 2017-03-08 DIAGNOSIS — W19XXXA Unspecified fall, initial encounter: Secondary | ICD-10-CM | POA: Diagnosis not present

## 2017-03-08 DIAGNOSIS — M25552 Pain in left hip: Secondary | ICD-10-CM | POA: Diagnosis not present

## 2017-03-08 DIAGNOSIS — N179 Acute kidney failure, unspecified: Secondary | ICD-10-CM | POA: Insufficient documentation

## 2017-03-08 DIAGNOSIS — K219 Gastro-esophageal reflux disease without esophagitis: Secondary | ICD-10-CM | POA: Insufficient documentation

## 2017-03-08 DIAGNOSIS — N183 Chronic kidney disease, stage 3 (moderate): Secondary | ICD-10-CM | POA: Insufficient documentation

## 2017-03-08 DIAGNOSIS — Z79899 Other long term (current) drug therapy: Secondary | ICD-10-CM | POA: Diagnosis not present

## 2017-03-08 DIAGNOSIS — S4991XA Unspecified injury of right shoulder and upper arm, initial encounter: Secondary | ICD-10-CM | POA: Diagnosis not present

## 2017-03-08 LAB — BASIC METABOLIC PANEL
Anion gap: 8 (ref 5–15)
BUN: 34 mg/dL — AB (ref 6–20)
CALCIUM: 8.5 mg/dL — AB (ref 8.9–10.3)
CO2: 23 mmol/L (ref 22–32)
CREATININE: 1.4 mg/dL — AB (ref 0.44–1.00)
Chloride: 108 mmol/L (ref 101–111)
GFR calc non Af Amer: 32 mL/min — ABNORMAL LOW (ref >60)
GFR, EST AFRICAN AMERICAN: 38 mL/min — AB (ref >60)
Glucose, Bld: 130 mg/dL — ABNORMAL HIGH (ref 65–99)
Potassium: 4.4 mmol/L (ref 3.5–5.1)
SODIUM: 139 mmol/L (ref 135–145)

## 2017-03-08 LAB — CBC
HEMATOCRIT: 36.5 % (ref 35.0–47.0)
Hemoglobin: 12.3 g/dL (ref 12.0–16.0)
MCH: 32.6 pg (ref 26.0–34.0)
MCHC: 33.6 g/dL (ref 32.0–36.0)
MCV: 97 fL (ref 80.0–100.0)
PLATELETS: 142 10*3/uL — AB (ref 150–440)
RBC: 3.77 MIL/uL — ABNORMAL LOW (ref 3.80–5.20)
RDW: 13.1 % (ref 11.5–14.5)
WBC: 8.3 10*3/uL (ref 3.6–11.0)

## 2017-03-08 LAB — TROPONIN I

## 2017-03-08 MED ORDER — SODIUM CHLORIDE 0.9 % IV SOLN
INTRAVENOUS | Status: AC
  Administered 2017-03-08: 20:00:00 via INTRAVENOUS

## 2017-03-08 MED ORDER — POLYETHYLENE GLYCOL 3350 17 G PO PACK: 17.0000 g | PACK | Freq: Every day | ORAL | Status: DC | PRN

## 2017-03-08 MED ORDER — ACETAMINOPHEN 650 MG RE SUPP: 650.0000 mg | Freq: Four times a day (QID) | RECTAL | Status: DC | PRN

## 2017-03-08 MED ORDER — SIMVASTATIN 20 MG PO TABS
40.0000 mg | ORAL_TABLET | Freq: Every day | ORAL | Status: DC
  Administered 2017-03-08 – 2017-03-09 (×2): 40 mg via ORAL
  Filled 2017-03-08 (×3): qty 2

## 2017-03-08 MED ORDER — ASPIRIN EC 81 MG PO TBEC
81.0000 mg | DELAYED_RELEASE_TABLET | Freq: Every day | ORAL | Status: DC
  Administered 2017-03-09 – 2017-03-10 (×2): 81 mg via ORAL
  Filled 2017-03-08 (×2): qty 1

## 2017-03-08 MED ORDER — ONDANSETRON HCL 4 MG PO TABS
4.0000 mg | ORAL_TABLET | Freq: Four times a day (QID) | ORAL | Status: DC | PRN
Start: 2017-03-08 — End: 2017-03-10
  Administered 2017-03-09: 4 mg via ORAL
  Filled 2017-03-08: qty 1

## 2017-03-08 MED ORDER — PANTOPRAZOLE SODIUM 40 MG PO TBEC
40.0000 mg | DELAYED_RELEASE_TABLET | Freq: Every day | ORAL | Status: DC
  Administered 2017-03-08 – 2017-03-10 (×3): 40 mg via ORAL
  Filled 2017-03-08 (×3): qty 1

## 2017-03-08 MED ORDER — NITROGLYCERIN 0.4 MG SL SUBL: 0.4000 mg | SUBLINGUAL_TABLET | SUBLINGUAL | Status: DC | PRN

## 2017-03-08 MED ORDER — HYDROCODONE-ACETAMINOPHEN 5-325 MG PO TABS
1.0000 | ORAL_TABLET | ORAL | Status: DC | PRN
  Administered 2017-03-08 – 2017-03-09 (×3): 1 via ORAL
  Filled 2017-03-08 (×3): qty 1

## 2017-03-08 MED ORDER — METAXALONE 400 MG HALF TABLET
400.0000 mg | ORAL_TABLET | Freq: Three times a day (TID) | ORAL | Status: DC
  Administered 2017-03-08 – 2017-03-10 (×4): 400 mg via ORAL
  Filled 2017-03-08 (×6): qty 1

## 2017-03-08 MED ORDER — AMLODIPINE BESYLATE 5 MG PO TABS
2.5000 mg | ORAL_TABLET | Freq: Every day | ORAL | Status: DC
  Administered 2017-03-09 – 2017-03-10 (×2): 2.5 mg via ORAL
  Filled 2017-03-08 (×2): qty 1

## 2017-03-08 MED ORDER — LOSARTAN POTASSIUM 50 MG PO TABS
100.0000 mg | ORAL_TABLET | Freq: Every day | ORAL | Status: DC
  Administered 2017-03-09 – 2017-03-10 (×2): 100 mg via ORAL
  Filled 2017-03-08 (×2): qty 2

## 2017-03-08 MED ORDER — ALPRAZOLAM 0.25 MG PO TABS
0.2500 mg | ORAL_TABLET | Freq: Two times a day (BID) | ORAL | Status: DC | PRN
  Administered 2017-03-08 – 2017-03-09 (×2): 0.25 mg via ORAL
  Filled 2017-03-08 (×2): qty 1

## 2017-03-08 MED ORDER — MORPHINE SULFATE (PF) 2 MG/ML IV SOLN
INTRAVENOUS | Status: AC
  Administered 2017-03-08: 2 mg via INTRAVENOUS
  Filled 2017-03-08: qty 1

## 2017-03-08 MED ORDER — ENOXAPARIN SODIUM 30 MG/0.3ML ~~LOC~~ SOLN
30.0000 mg | SUBCUTANEOUS | Status: DC
  Administered 2017-03-08 – 2017-03-09 (×2): 30 mg via SUBCUTANEOUS
  Filled 2017-03-08 (×2): qty 0.3

## 2017-03-08 MED ORDER — ACETAMINOPHEN 325 MG PO TABS: 650.0000 mg | ORAL_TABLET | Freq: Four times a day (QID) | ORAL | Status: DC | PRN

## 2017-03-08 MED ORDER — CLOPIDOGREL BISULFATE 75 MG PO TABS
75.0000 mg | ORAL_TABLET | Freq: Every day | ORAL | Status: DC
  Administered 2017-03-09 – 2017-03-10 (×2): 75 mg via ORAL
  Filled 2017-03-08 (×2): qty 1

## 2017-03-08 MED ORDER — MORPHINE SULFATE (PF) 2 MG/ML IV SOLN
2.0000 mg | INTRAVENOUS | Status: DC | PRN
  Administered 2017-03-08 – 2017-03-09 (×2): 2 mg via INTRAVENOUS
  Filled 2017-03-08: qty 1

## 2017-03-08 MED ORDER — FENTANYL CITRATE (PF) 100 MCG/2ML IJ SOLN
50.0000 ug | Freq: Once | INTRAMUSCULAR | Status: AC
  Administered 2017-03-08: 50 ug via INTRAVENOUS
  Filled 2017-03-08: qty 2

## 2017-03-08 MED ORDER — METAXALONE 400 MG HALF TABLET: 400.0000 mg | ORAL_TABLET | Freq: Three times a day (TID) | ORAL | Status: DC

## 2017-03-08 MED ORDER — DOCUSATE SODIUM 100 MG PO CAPS
100.0000 mg | ORAL_CAPSULE | Freq: Two times a day (BID) | ORAL | Status: DC
Start: 2017-03-08 — End: 2017-03-10
  Administered 2017-03-08 – 2017-03-10 (×4): 100 mg via ORAL
  Filled 2017-03-08 (×4): qty 1

## 2017-03-08 MED ORDER — ONDANSETRON HCL 4 MG/2ML IJ SOLN
4.0000 mg | Freq: Once | INTRAMUSCULAR | Status: AC
  Administered 2017-03-08: 4 mg via INTRAVENOUS
  Filled 2017-03-08: qty 2

## 2017-03-08 MED ORDER — ONDANSETRON HCL 4 MG/2ML IJ SOLN: 4.0000 mg | Freq: Four times a day (QID) | INTRAMUSCULAR | Status: DC | PRN

## 2017-03-08 NOTE — Telephone Encounter (Signed)
Left message for patient to return call to office. 

## 2017-03-08 NOTE — Progress Notes (Signed)
Anticoagulation monitoring(Lovenox):  83 yo female ordered Lovenox 40 mg Q24h  Filed Weights   03/08/17 1420  Weight: 128 lb (58.1 kg)   BMI    Lab Results  Component Value Date   CREATININE 1.40 (H) 03/08/2017   CREATININE 1.55 (H) 01/30/2017   CREATININE 1.25 (H) 12/22/2016   Estimated Creatinine Clearance: 23.5 mL/min (A) (by C-G formula based on SCr of 1.4 mg/dL (H)). Hemoglobin & Hematocrit     Component Value Date/Time   HGB 12.3 03/08/2017 1627   HGB 13.5 06/17/2014 0950   HCT 36.5 03/08/2017 1627   HCT 40.6 06/17/2014 0950     Per Protocol for Patient with estCrcl < 30 ml/min and BMI < 40, will transition to Lovenox 30 mg Q24h.

## 2017-03-08 NOTE — Telephone Encounter (Signed)
Noted, patient not coming in, thanks

## 2017-03-08 NOTE — Telephone Encounter (Signed)
[name] daughter called back spoke with [name] and stated that she is going thinks it better to go ahead and take patient to ER. Thanks for trying to work patient in today.

## 2017-03-08 NOTE — Telephone Encounter (Addendum)
Disagree/Comply Comments  Caller states that she wants her mother to be seen at the office this afternoon, there are no appts available, she would like her mother to be worked in today. Her mother will be home today after 2 pm.   Referrals  REFERRED TO PCP OFFICE   Juliann Pulse left message for patient to call .

## 2017-03-08 NOTE — Telephone Encounter (Signed)
Called daughter [name] and advised her to bring patient in for appointment today at 4:15pm . Daughter verbalized understanding.   Will you please place on schedule ?  Thank you .

## 2017-03-08 NOTE — H&P (Signed)
[facility] [place] NAME: [name]    MR#:  [id number]  DATE OF BIRTH:  1926/03/05  DATE OF ADMISSION:  03/08/2017  PRIMARY CARE PHYSICIAN: [name], MD   REQUESTING/REFERRING PHYSICIAN: Dr. Harvest Dark  CHIEF COMPLAINT:   Chief Complaint  Patient presents with  . Fall    HISTORY OF PRESENT ILLNESS:  [name]  is a 83 y.o. female with a known history of  CAD status post CABG, COPD not on home oxygen, diverticulosis, GERD, hypertension presents to the hospital secondary to a fall. Patient has been doing very well recently, she was at the beach today when she tripped and fell over a cord and started complaining of significant left-sided hip pain. Family was able to put her in the car and drive for 3 hours to get back home. However she was unable to get out of the car here and so was brought to the emergency room. She complains of significant left shoulder pain and also left hip pain. X-rays revealed no shoulder injury or fractures, however has left superior pubic ram I fracture. She is being admitted for pain control and physical therapy evaluation. Denies any chest pain, dizziness. Independent at baseline and lives by herself. Denies any syncopal episode today.  PAST MEDICAL HISTORY:   Past Medical History:  Diagnosis Date  . C. difficile colitis   . CAD (coronary artery disease)    a. Nuclear 10/11, not gated, no scar or ischemia, EF 68%, low risk study; b. [place] 06/18/14: no evidence of infarction or ischemia, no WMA, normalization of TWI in precordial leads, equivocal EKG changes EF 77%, low risk study  . Carotid artery disease ([place])    Doppler January, 2012, 40-59% bilateral, followup one year  . Diffuse cystic mastopathy   . Diverticulitis    last colonoscopy  incomplete Jan 2011  . Dizziness    stable now (Oct 2011) patient tells me question of TIA, we will obtain records  . Gait abnormality     Patient complains of "wobbly gait", December, 2013  . GERD (gastroesophageal reflux disease)   . History of migraines   . Hx of CABG    a. 3 vessel in 1999  . Hyperlipidemia   . Hypertension   . Indigestion    3 weeks, October 2011  . Kidney stones   . Rheumatic fever   . Sinus bradycardia    Mild, December, 2013  . SOB (shortness of breath)   . Syncopal episodes    after 18 holes of golf and increase hydrochlorothiazide, resolved     PAST SURGICAL HISTORY:   Past Surgical History:  Procedure Laterality Date  . APPENDECTOMY  1950  . BREAST BIOPSY Right 1994  . BREAST CYST ASPIRATION     multiple BIL  . CATARACT EXTRACTION Right 2009  . CATARACT EXTRACTION Left 2011  . COLONOSCOPY  [id number]   Dr. Jamal Collin  . CORONARY ARTERY BYPASS GRAFT  1999  . esophagus stretched    . TONSILLECTOMY  1939  . TOTAL ABDOMINAL HYSTERECTOMY  1968   due to metrorrhagia    SOCIAL HISTORY:   Social History  Substance Use Topics  . Smoking status: Former Smoker    Packs/day: 0.50    Years: 40.00    Types: Cigarettes    Quit date: 09/20/1983  . Smokeless tobacco: Never Used  . Alcohol use Yes     Comment: yes, social wine  FAMILY HISTORY:   Family History  Problem Relation Age of Onset  . Heart disease Mother   . Heart disease Father   . Cancer Neg Hx   . Drug abuse Neg Hx   . Breast cancer Neg Hx     DRUG ALLERGIES:   Allergies  Allergen Reactions  . Codeine Nausea And Vomiting    REVIEW OF SYSTEMS:   Review of Systems  Constitutional: Positive for malaise/fatigue. Negative for chills, fever and weight loss.  HENT: Negative for ear discharge, ear pain, hearing loss, nosebleeds and tinnitus.   Eyes: Negative for blurred vision, double vision and photophobia.  Respiratory: Negative for cough, hemoptysis, shortness of breath and wheezing.   Cardiovascular: Negative for chest pain, palpitations, orthopnea and leg swelling.  Gastrointestinal: Negative for abdominal  pain, constipation, diarrhea, heartburn, melena, nausea and vomiting.  Genitourinary: Negative for dysuria, frequency, hematuria and urgency.  Musculoskeletal: Positive for falls, joint pain and myalgias. Negative for back pain and neck pain.  Skin: Negative for rash.  Neurological: Negative for dizziness, tingling, tremors, sensory change, speech change, focal weakness and headaches.  Endo/Heme/Allergies: Does not bruise/bleed easily.  Psychiatric/Behavioral: Negative for depression.    MEDICATIONS AT HOME:   Prior to Admission medications   Medication Sig Start Date End Date Taking? Authorizing Provider  ALPRAZolam Duanne Moron) 0.25 MG tablet 1 to 2 tablets at bedtime as needed for insomnia 01/30/17  Yes [name], MD  amLODipine (NORVASC) 2.5 MG tablet TAKE 1 TABLET EVERY DAY 12/09/16  Yes [name], MD  aspirin 81 MG tablet Take 81 mg by mouth daily.     Yes [provider]  clopidogrel (PLAVIX) 75 MG tablet TAKE 1 TABLET BY MOUTH ONE TIME DAILY 12/14/16  Yes [name], MD  esomeprazole (NEXIUM) 40 MG capsule TAKE 1 CAPSULE DAILY BEFORE BREAKFAST 05/09/13  Yes [name], MD  losartan (COZAAR) 100 MG tablet TAKE 1 TABLET EVERY DAY 12/30/16  Yes [name], MD  nitroGLYCERIN (NITROSTAT) 0.4 MG SL tablet Place 1 tablet (0.4 mg total) under the tongue every 5 (five) minutes as needed for chest pain. 06/17/16  Yes Minna Merritts, MD  simvastatin (ZOCOR) 40 MG tablet Take 1 tablet (40 mg total) by mouth at bedtime. 01/16/17  Yes Gollan, Kathlene November, MD  Ipratropium-Albuterol (COMBIVENT RESPIMAT) 20-100 MCG/ACT AERS respimat Inhale 1 puff into the lungs every 6 (six) hours. Patient not taking: Reported on 03/08/2017 01/25/17   Wilhelmina Mcardle, MD      VITAL SIGNS:  Blood pressure (!) 145/47, pulse 63, temperature 98 F (36.7 C), temperature source Oral, resp. rate 18, weight 58.1 kg (128 lb), SpO2 99 %.  PHYSICAL EXAMINATION:   Physical Exam  GENERAL:  83  y.o.-year-old patient lying in the bed with no acute distress.  EYES: Pupils equal, round, reactive to light and accommodation. No scleral icterus. Extraocular muscles intact.  HEENT: Head atraumatic, normocephalic. Oropharynx and nasopharynx clear.  NECK:  Supple, no jugular venous distention. No thyroid enlargement, no tenderness.  LUNGS: Normal breath sounds bilaterally, no wheezing, rales,rhonchi or crepitation. No use of accessory muscles of respiration. Decreased bibasilar breath sounds CARDIOVASCULAR: S1, S2 normal. No rubs, or gallops. 2/6 systolic murmur is present ABDOMEN: Soft, nontender, nondistended. Bowel sounds present. No organomegaly or mass.  EXTREMITIES: No pedal edema, cyanosis, or clubbing.  NEUROLOGIC: Cranial nerves II through XII are intact. Muscle strength 5/5 in all extremities except left lower extremity due to pain. Sensation intact. Gait  not checked.  PSYCHIATRIC: The patient is alert and oriented x 3.  SKIN: No obvious rash, lesion, or ulcer.   LABORATORY PANEL:   CBC  Recent Labs Lab 03/08/17 1627  WBC 8.3  HGB 12.3  HCT 36.5  PLT 142*   ------------------------------------------------------------------------------------------------------------------  Chemistries   Recent Labs Lab 03/08/17 1549  NA 139  K 4.4  CL 108  CO2 23  GLUCOSE 130*  BUN 34*  CREATININE 1.40*  CALCIUM 8.5*   ------------------------------------------------------------------------------------------------------------------  Cardiac Enzymes  Recent Labs Lab 03/08/17 1549  TROPONINI <0.03   ------------------------------------------------------------------------------------------------------------------  RADIOLOGY:  Dg Pelvis 1-2 Views  Result Date: 03/08/2017 CLINICAL DATA:  False morning with left hip pain, initial encounter EXAM: PELVIS - 1 VIEW COMPARISON:  None. FINDINGS: The pelvic ring is intact. The proximal femurs are within normal limits. Minimal  irregularity is noted along the superior pubic ramus on the left which may represent an undisplaced fracture. No other fracture is seen. Degenerative changes of the lumbar spine are noted. No soft tissue abnormality is seen. IMPRESSION: Suspicious for undisplaced left superior pubic ramus fracture. Electronically Signed   By: Inez Catalina M.D.   On: 03/08/2017 15:16   Dg Shoulder Left  Result Date: 03/08/2017 CLINICAL DATA:  Recent fall with left shoulder pain, initial encounter EXAM: LEFT SHOULDER - 2+ VIEW COMPARISON:  None. FINDINGS: No acute fracture or dislocation is noted. Underlying bony thorax is within normal limits. No soft tissue abnormality is seen. IMPRESSION: No acute abnormality noted. Electronically Signed   By: Inez Catalina M.D.   On: 03/08/2017 15:16    EKG:   Orders placed or performed in visit on 01/16/17  . EKG 12-Lead    IMPRESSION AND PLAN:   Laiklyn Pilkenton  is a 83 y.o. female with a known history of  CAD status post CABG, COPD not on home oxygen, diverticulosis, GERD, hypertension presents to the hospital secondary to a fall.  #1 fall and left pubic rami fracture- Will admit for pain control. -Physical therapy consulted. Patient is hoping to go to rehabilitation. -Social worker consult  #2 hypertension-continue Norvasc and Cozaar  #3 CAD-stable. Patient on aspirin, Plavix and statin. Denies any chest pain at this time.  #4 GERD-on Protonix  #5 DVT prophylaxis on Lovenox    All the records are reviewed and case discussed with ED provider. Management plans discussed with the patient, family and they are in agreement.  CODE STATUS: Full code  TOTAL TIME TAKING CARE OF THIS PATIENT: 50 minutes.    Kenli Waldo M.D on 03/08/2017 at 5:46 PM  Between 7am to 6pm - Pager - [phone]  After 6pm go to [email or web address] - password [email or web address]  Office  [phone]  CC: Primary care physician; [name], MD

## 2017-03-08 NOTE — ED Provider Notes (Signed)
[facility] Emergency Department Provider Note  Time seen: 3:35 PM  I have reviewed the triage vital signs and the nursing notes.   HISTORY  Chief Complaint Fall    HPI [name] is a 83 y.o. female with a past medical history of hypertension, hyperlipidemia, very independent, lives alone, presents to the emergency department after a fall with left shoulder and left pelvic pain. According to the patient she had a fall today landing on her left side. Denies hitting her head. Denies LOC. Denies headache, focal weakness or numbness. Patient states she is having mild to moderate pain in the left shoulder worse with movement, and moderate to severe pain in the left pelvis much worse with attempted ambulation. Patient states she is also experiencing a moderate amount of chest pain which she believes is likely from the fall but was not sure if it is her heart from the fall so she took a nitroglycerin but states did not note any relief of pain in her left shoulder.  Past Medical History:  Diagnosis Date  . C. difficile colitis   . CAD (coronary artery disease)    a. Nuclear 10/11, not gated, no scar or ischemia, EF 68%, low risk study; b. [place] 06/18/14: no evidence of infarction or ischemia, no WMA, normalization of TWI in precordial leads, equivocal EKG changes EF 77%, low risk study  . Carotid artery disease ([place])    Doppler January, 2012, 40-59% bilateral, followup one year  . Diffuse cystic mastopathy   . Diverticulitis    last colonoscopy  incomplete Jan 2011  . Dizziness    stable now (Oct 2011) patient tells me question of TIA, we will obtain records  . Gait abnormality    Patient complains of "wobbly gait", December, 2013  . GERD (gastroesophageal reflux disease)   . History of migraines   . Hx of CABG    a. 3 vessel in 1999  . Hyperlipidemia   . Hypertension   . Indigestion    3 weeks, October 2011  . Kidney stones   . Rheumatic fever    . Sinus bradycardia    Mild, December, 2013  . SOB (shortness of breath)   . Syncopal episodes    after 18 holes of golf and increase hydrochlorothiazide, resolved     Patient Active Problem List   Diagnosis Date Noted  . Allergic rhinitis 12/31/2016  . Hospital discharge follow-up 12/31/2016  . Change in bowel movement 08/20/2016  . EKG abnormalities 06/17/2016  . Osteopenia 02/13/2016  . Dizziness and giddiness 02/13/2016  . Insomnia secondary to anxiety 07/12/2015  . Loss of weight 02/09/2015  . Essential hypertension 10/07/2014  . Syncopal episodes   . Sinus bradycardia   . Atherosclerosis of aorta ([place]) 06/26/2014  . History of Clostridium difficile colitis 10/20/2013  . History of acute pancreatitis 10/20/2013  . Lumbago 10/04/2013  . Medicare annual wellness visit, subsequent 02/07/2013  . Chest pain at rest 12/26/2011  . Hx of CABG   . Chronic kidney disease (CKD), stage III (moderate) 09/25/2011  . Hyperlipidemia   . Hypertension   . SOB (shortness of breath)   . CAD (coronary artery disease)   . Carotid artery disease [facility])     Past Surgical History:  Procedure Laterality Date  . APPENDECTOMY  1950  . BREAST BIOPSY Right 1994  . BREAST CYST ASPIRATION     multiple BIL  . CATARACT EXTRACTION Right 2009  . CATARACT EXTRACTION Left 2011  .  COLONOSCOPY  [id number]   Dr. Jamal Collin  . CORONARY ARTERY BYPASS GRAFT  1999  . esophagus stretched    . TONSILLECTOMY  1939  . TOTAL ABDOMINAL HYSTERECTOMY  1968   due to metrorrhagia    Prior to Admission medications   Medication Sig Start Date End Date Taking? Authorizing Provider  ALPRAZolam Duanne Moron) 0.25 MG tablet 1 to 2 tablets at bedtime as needed for insomnia 01/30/17   [name], MD  amLODipine (NORVASC) 2.5 MG tablet TAKE 1 TABLET EVERY DAY 12/09/16   [name], MD  aspirin 81 MG tablet Take 81 mg by mouth daily.      [provider]  clopidogrel (PLAVIX) 75 MG tablet TAKE 1 TABLET BY MOUTH  ONE TIME DAILY 12/14/16   [name], MD  esomeprazole (NEXIUM) 40 MG capsule TAKE 1 CAPSULE DAILY BEFORE BREAKFAST 05/09/13   [name], MD  Ipratropium-Albuterol (COMBIVENT RESPIMAT) 20-100 MCG/ACT AERS respimat Inhale 1 puff into the lungs every 6 (six) hours. 01/25/17   Wilhelmina Mcardle, MD  losartan (COZAAR) 100 MG tablet TAKE 1 TABLET EVERY DAY 12/30/16   [name], MD  nitroGLYCERIN (NITROSTAT) 0.4 MG SL tablet Place 1 tablet (0.4 mg total) under the tongue every 5 (five) minutes as needed for chest pain. 06/17/16   Minna Merritts, MD  simvastatin (ZOCOR) 40 MG tablet Take 1 tablet (40 mg total) by mouth at bedtime. 01/16/17   Minna Merritts, MD    Allergies  Allergen Reactions  . Codeine Nausea And Vomiting    Family History  Problem Relation Age of Onset  . Heart disease Mother   . Heart disease Father   . Cancer Neg Hx   . Drug abuse Neg Hx   . Breast cancer Neg Hx     Social History Social History  Substance Use Topics  . Smoking status: Former Smoker    Packs/day: 0.50    Years: 40.00    Types: Cigarettes    Quit date: 09/20/1983  . Smokeless tobacco: Never Used  . Alcohol use Yes     Comment: yes, social wine    Review of Systems Constitutional: Negative for fever. Negative for LOC. Cardiovascular: Left chest discomfort/left shoulder pain Respiratory: Negative for shortness of breath. Gastrointestinal: Negative for abdominal pain, vomiting  Genitourinary: Negative for dysuria. Musculoskeletal: Negative for neck or back pain. Positive for left pelvic pain. Positive left shoulder pain. Skin: Negative for rash. Neurological: Negative for headaches, focal weakness or numbness. All other ROS negative  ____________________________________________   PHYSICAL EXAM:  VITAL SIGNS: ED Triage Vitals  Enc Vitals Group     BP 03/08/17 1421 135/62     Pulse Rate 03/08/17 1421 76     Resp 03/08/17 1421 18     Temp 03/08/17 1421 98 F (36.7 C)      Temp Source 03/08/17 1421 Oral     SpO2 03/08/17 1421 99 %     Weight 03/08/17 1420 128 lb (58.1 kg)     Height --      Head Circumference --      Peak Flow --      Pain Score 03/08/17 1420 10     Pain Loc --      Pain Edu? --      Excl. in [place]? --     Constitutional: Alert and oriented. Well appearing and in no distress. Eyes: Normal exam ENT   Head: Normocephalic and atraumatic.  Mouth/Throat: Mucous membranes are moist. Cardiovascular: Normal rate, regular rhythm. Respiratory: Normal respiratory effort without tachypnea nor retractions. Breath sounds are clear  Gastrointestinal: Soft and nontender. No distention.  Musculoskeletal: Patient has mild pain/tenderness with passive range of motion of left shoulder, moderate pain with active range of motion. However she does have good range of motion in the shoulder, neurovascular intact distally. The left-sided chest/shoulder discomfort is reproduced with shoulder movement. Patient has moderate tenderness with range of motion of left hip, good range of motion right hip, neurovascular intact distally in both lower extremities. Neurologic:  Normal speech and language. No gross focal neurologic deficits  Skin:  Skin is warm, dry and intact.  Psychiatric: Mood and affect are normal.   ____________________________________________    RADIOLOGY  Shoulder x-ray negative. Pelvis x-ray shows nondisplaced left superior pubic rami fracture  ____________________________________________   INITIAL IMPRESSION / ASSESSMENT AND PLAN / ED COURSE  Pertinent labs & imaging results that were available during my care of the patient were reviewed by me and considered in my medical decision making (see chart for details).  Patient presents to the emergency department after a fall. Patient has left pelvic pain x-ray shows a nondisplaced superior pubic rami fracture. Patient states significant pain with any attempted ambulation. We will treat the  patient's pain and attempt ambulation with a walker. Patient's shoulder x-ray is negative. Highly suspect the patient's left shoulder pain/chest pain is due to the shoulder injury. No chest tenderness to palpation. We'll obtain an EKG and labs including a cardiac enzyme as a precaution. The patient is able to safely ambulate we will discharge home with pain medication and orthopedic follow-up. If the patient is not able to safely ambulate we will likely admit to the hospital for pain management and placement.  Patient attempted ambulation, required significant assistance and had significant pain with ambulation. Patient will be admitted to the hospital for further treatment, pain management and possible rehabilitation placement.  ____________________________________________   FINAL CLINICAL IMPRESSION(S) / ED DIAGNOSES  Pubic rami fracture Cheral Marker, MD 03/08/17 1714

## 2017-03-08 NOTE — ED Triage Notes (Signed)
Pt presents with left shoulder and left side pelvis after tripping and falling this am. NO LOC.

## 2017-03-08 NOTE — Telephone Encounter (Signed)
4:30 today on my schedule

## 2017-03-08 NOTE — ED Notes (Signed)
Recollect sent. Lavender and light green top

## 2017-03-08 NOTE — Telephone Encounter (Signed)
Patient Name: [name]  DOB: 02-Nov-1925    Initial Comment Caller states she tripped and she has a knot on he left hip. She is sore.    Nurse Assessment  Nurse: Leilani Merl, RN, Heather Date/Time (Eastern Time): 03/08/2017 8:59:49 AM  Confirm and document reason for call. If symptomatic, describe symptoms. ---Caller states she tripped and she has a knot on her left thigh. She is sore in her groin area  Does the patient have any new or worsening symptoms? ---Yes  Will a triage be completed? ---Yes  Related visit to physician within the last 2 weeks? ---No  Does the PT have any chronic conditions? (i.e. diabetes, asthma, etc.) ---Yes  List chronic conditions. ---See MR  Is this a behavioral health or substance abuse call? ---No     Guidelines    Guideline Title Affirmed Question Affirmed Notes  Leg Injury [1] High-risk adult (e.g., age > 65, osteoporosis, chronic steroid use) AND [2] limping    Final Disposition User   See Physician within 24 Hours Stand[place], RN, Conservator, museum/gallery states that she wants her mother to be seen at the office this afternoon, there are no appts available, she would like her mother to be worked in today. Her mother will be home today after 2 pm.   Referrals  REFERRED TO PCP OFFICE   Disagree/Comply: Comply

## 2017-03-08 NOTE — Telephone Encounter (Addendum)
Patient tripped over a fan cord this morning, pt has a injury to her hip that resulted to her having a knot on her left hip.Patient is up walking around, however daughter has concerns that pt could possibly have a blood clot. *daughter transferred to Nurse line  Contact Karen-daughter [phone]  Daughter requested patient be seen in this office this afternoon by any provider

## 2017-03-08 NOTE — Telephone Encounter (Signed)
Pt daughter called to follow up on her mom being worked in. Please advise?  Call daughter @ [phone]. Thank you!

## 2017-03-09 DIAGNOSIS — S329XXA Fracture of unspecified parts of lumbosacral spine and pelvis, initial encounter for closed fracture: Secondary | ICD-10-CM | POA: Diagnosis not present

## 2017-03-09 DIAGNOSIS — I2510 Atherosclerotic heart disease of native coronary artery without angina pectoris: Secondary | ICD-10-CM | POA: Diagnosis not present

## 2017-03-09 DIAGNOSIS — S32512A Fracture of superior rim of left pubis, initial encounter for closed fracture: Secondary | ICD-10-CM | POA: Diagnosis not present

## 2017-03-09 DIAGNOSIS — I10 Essential (primary) hypertension: Secondary | ICD-10-CM | POA: Diagnosis not present

## 2017-03-09 DIAGNOSIS — J449 Chronic obstructive pulmonary disease, unspecified: Secondary | ICD-10-CM | POA: Diagnosis not present

## 2017-03-09 LAB — CBC
HCT: 37.3 % (ref 35.0–47.0)
Hemoglobin: 12.4 g/dL (ref 12.0–16.0)
MCH: 32.7 pg (ref 26.0–34.0)
MCHC: 33.2 g/dL (ref 32.0–36.0)
MCV: 98.4 fL (ref 80.0–100.0)
PLATELETS: 119 10*3/uL — AB (ref 150–440)
RBC: 3.79 MIL/uL — ABNORMAL LOW (ref 3.80–5.20)
RDW: 13.4 % (ref 11.5–14.5)
WBC: 5.3 10*3/uL (ref 3.6–11.0)

## 2017-03-09 LAB — BASIC METABOLIC PANEL
Anion gap: 3 — ABNORMAL LOW (ref 5–15)
BUN: 23 mg/dL — AB (ref 6–20)
CALCIUM: 8.3 mg/dL — AB (ref 8.9–10.3)
CO2: 27 mmol/L (ref 22–32)
CREATININE: 1.1 mg/dL — AB (ref 0.44–1.00)
Chloride: 111 mmol/L (ref 101–111)
GFR calc Af Amer: 50 mL/min — ABNORMAL LOW (ref >60)
GFR, EST NON AFRICAN AMERICAN: 44 mL/min — AB (ref >60)
Glucose, Bld: 111 mg/dL — ABNORMAL HIGH (ref 65–99)
Potassium: 4 mmol/L (ref 3.5–5.1)
SODIUM: 141 mmol/L (ref 135–145)

## 2017-03-09 NOTE — Care Management Obs Status (Signed)
[facility] NOTIFICATION   Patient Details  Name: [name] MRN: [id number] Date of Birth: 1926/08/27   Medicare Observation Status Notification Given:  Yes    Jolly Mango, RN 03/09/2017, 1:52 PM

## 2017-03-09 NOTE — Evaluation (Signed)
Occupational Therapy Evaluation Patient Details Name: [name] MRN: [id number] DOB: Jun 02, 1926 Today's Date: 03/09/2017    History of Present Illness Pt is a 83 y/o F who presented after fall onto L side.  Pt with c/o L shoulder pain and L hip pain.  X-ray revealed no shoulder injury or fractures, however pt has L superior pubic ramus fracture.  Pt's PMH includes CAD, CABG.   Clinical Impression   Pt seen for OT Evaluation this date. Pt was independent at baseline, very active in community and strong family and social support system, and very eager to return to PLOF. Pt presents with pain, decreased strength/activity tolerance/increased risk of falls and would benefit from skilled OT services while in the hospital to maximize return to PLOF and minimize risk of future falls/injury/rehospitalization. Do not anticipate additional OT needs after discharge.    Follow Up Recommendations  No OT follow up;Supervision/Assistance - 24 hour    Equipment Recommendations  Tub/shower seat;3 in 1 bedside commode    Recommendations for Other Services       Precautions / Restrictions Precautions Precautions: Fall Restrictions Weight Bearing Restrictions: Yes RLE Weight Bearing: Weight bearing as tolerated LLE Weight Bearing: Weight bearing as tolerated      Mobility Bed Mobility     General bed mobility comments: deferred d/t pt up in recliner for session  Transfers Overall transfer level: Needs assistance Equipment used: Rolling walker (2 wheeled) Transfers: Sit to/from Stand Sit to Stand: Min guard         General transfer comment: VC for hand placement    Balance Overall balance assessment: Needs assistance Sitting-balance support: No upper extremity supported;Feet supported Sitting balance-Leahy Scale: Good     Standing balance support: No upper extremity supported;During functional activity Standing balance-Leahy Scale: Fair                            ADL either performed or assessed with clinical judgement   ADL Overall ADL's : Needs assistance/impaired Eating/Feeding: Sitting;Set up   Grooming: Sitting;Set up   Upper Body Bathing: Sitting;Minimal assistance Upper Body Bathing Details (indicate cue type and reason): min assist for R side and back Lower Body Bathing: Moderate assistance;Sitting/lateral leans;Minimal assistance Lower Body Bathing Details (indicate cue type and reason): pt educated in seated showering to minimize falls and for energy conservation; discussed merits of tub transfer bench for 1st fl tub shower but pt intends to take sponge bath until she can use 2nd floor walk in shower again; encouraged grab bars and a shower chair for long term use Upper Body Dressing : Sitting;Set up;Minimal assistance Upper Body Dressing Details (indicate cue type and reason): educated pt in 1 handed technique to minimize pain and maximize independence, pt verbalized understanding Lower Body Dressing: Moderate assistance;Sitting/lateral leans;Sit to/from stand;Minimal assistance Lower Body Dressing Details (indicate cue type and reason): pt educated in use of AE for LB dressing tasks to maximize safety and functional independence, pt verbalized understanding Toilet Transfer: Min guard;Regular Toilet;RW           Functional mobility during ADLs: Rolling walker;Min guard General ADL Comments: pt generally min/mod assist for LB ADL due to pain     Vision Baseline Vision/History: Wears glasses Wears Glasses: Reading only Patient Visual Report: No change from baseline Vision Assessment?: No apparent visual deficits     Perception     Praxis      Pertinent Vitals/Pain Pain Assessment: 0-10 Pain Score: 10-Worst  pain ever Faces Pain Scale: Hurts whole lot Pain Location: pelvis/groin with movement; 0/10 at rest Pain Descriptors / Indicators: Grimacing;Guarding;Aching Pain Intervention(s): Limited activity within patient's  tolerance;Monitored during session;Premedicated before session;Repositioned     Hand Dominance Right   Extremity/Trunk Assessment Upper Extremity Assessment Upper Extremity Assessment: Overall WFL for tasks assessed (L shoulder flexion limited right now due to pain)   Lower Extremity Assessment Lower Extremity Assessment: Defer to PT evaluation;RLE deficits/detail;LLE deficits/detail RLE Deficits / Details: Strength limited to 4/5 due to pain RLE: Unable to fully assess due to pain LLE Deficits / Details: Strength limited to 3+/5 due to pain LLE: Unable to fully assess due to pain   Cervical / Trunk Assessment Cervical / Trunk Assessment: Kyphotic   Communication Communication Communication: HOH   Cognition Arousal/Alertness: Awake/alert Behavior During Therapy: WFL for tasks assessed/performed Overall Cognitive Status: Within Functional Limits for tasks assessed                                     General Comments  daughter present for duration of session    Exercises Other Exercises Other Exercises: pt/daughter educated in AE/DME and home safety/environmental modifications to maximize safety and functional independence in the home; verbalized understanding Other Exercises: OT facilitated problem solving for bathing/tub transfers and plan for self care tasks upon return home   Shoulder Instructions      Home Living Family/patient expects to be discharged to:: Private residence Living Arrangements: Alone Available Help at Discharge: Family;Available 24 hours/day Type of Home: House Home Access: Stairs to enter [employer] of Steps: 2 Entrance Stairs-Rails: Left;Right;Can reach both Home Layout: Two level;Able to live on main level with bedroom/bathroom Alternate Level Stairs-Number of Steps: flight   Bathroom Shower/Tub: Tub/shower unit;Door   [employer] Toilet: Standard     Home Equipment: Walker - standard          Prior  Functioning/Environment Level of Independence: Independent        Comments: Pt ind with all aspects of mobility.  Denies any falls in the past 6 months except fall resulting in this hospitalization where she tripped over a cord on the floor.  Played 9 holes of golf just a few days before admission.         OT Problem List: Pain;Decreased strength;Decreased activity tolerance;Decreased knowledge of use of DME or AE      OT Treatment/Interventions: Self-care/ADL training;Therapeutic exercise;Therapeutic activities;Energy conservation;DME and/or AE instruction;Patient/family education    OT Goals(Current goals can be found in the care plan section) Acute Rehab OT Goals Patient Stated Goal: to go home and return to PLOF OT Goal Formulation: With patient/family Time For Goal Achievement: 03/23/17 Potential to Achieve Goals: Good  OT Frequency: Min 1X/week   Barriers to D/C:            Co-evaluation              AM-PAC PT "6 Clicks" Daily Activity     Outcome Measure Help from another person eating meals?: None Help from another person taking care of personal grooming?: None Help from another person toileting, which includes using toliet, bedpan, or urinal?: A Little Help from another person bathing (including washing, rinsing, drying)?: A Lot Help from another person to put on and taking off regular upper body clothing?: A Little Help from another person to put on and taking off regular lower body clothing?: A Lot 6 Click  Score: 18   End of Session    Activity Tolerance: Patient limited by pain Patient left: in chair;with call bell/phone within reach;with chair alarm set;with family/visitor present  OT Visit Diagnosis: Other abnormalities of gait and mobility (R26.89);Pain Pain - part of body: Hip (groin)                Time: 2505-3976 OT Time Calculation (min): 25 min Charges:  OT General Charges $OT Visit: 1 Procedure OT Evaluation $OT Eval Low Complexity: 1  Procedure OT Treatments $Self Care/Home Management : 8-22 mins G-Codes: OT G-codes **NOT FOR INPATIENT CLASS** Functional Assessment Tool Used: AM-PAC 6 Clicks Daily Activity;Clinical judgement Functional Limitation: Self care Self Care Current Status (B3419): At least 40 percent but less than 60 percent impaired, limited or restricted Self Care Goal Status (F7902): At least 40 percent but less than 60 percent impaired, limited or restricted   Jeni Salles, MPH, MS, OTR/L ascom [phone] 03/09/17, 1:31 PM

## 2017-03-09 NOTE — Evaluation (Signed)
Physical Therapy Evaluation Patient Details Name: [name] MRN: [id number] DOB: 1926-06-23 Today's Date: 03/09/2017   History of Present Illness  Pt is a 83 y/o F who presented after fall onto L side.  Pt with c/o L shoulder pain and L hip pain.  X-ray revealed no shoulder injury or fractures, however pt has L superior pubic ramus fracture.  Pt's PMH includes CAD, CABG.    Clinical Impression  Pt admitted with above diagnosis. Pt currently with functional limitations due to the deficits listed below (see PT Problem List). [name] was ind with aspects of mobility PTA and is an avid golfer.  She currently requires min guard assist for sit<>stand transfers, ambulation, and stairs due to pelvic pain. Her daughter reports that they will arrange for 24/7 assist/supervision at d/c.  Pt will benefit from skilled PT to increase their independence and safety with mobility to allow discharge to the venue listed below.      Follow Up Recommendations Home health PT;Supervision/Assistance - 24 hour    Equipment Recommendations  Rolling walker with 5" wheels    Recommendations for Other Services OT consult     Precautions / Restrictions Precautions Precautions: Fall Restrictions Weight Bearing Restrictions: Yes RLE Weight Bearing: Weight bearing as tolerated LLE Weight Bearing: Weight bearing as tolerated      Mobility  Bed Mobility Overal bed mobility: Needs Assistance Bed Mobility: Supine to Sit     Supine to sit: Supervision;HOB elevated     General bed mobility comments: Pt uses bed rail to assist in pulling up to sitting with increased time and effort.  No physical assist needed.  Transfers Overall transfer level: Needs assistance Equipment used: Rolling walker (2 wheeled) Transfers: Sit to/from Stand Sit to Stand: Min guard         General transfer comment: Pt slow to rise as this is when she experiences her most pain.  Min guard for safety.  Cues for hand  placement with sit<>stand.  Ambulation/Gait Ambulation/Gait assistance: Min guard Ambulation Distance (Feet): 30 Feet Assistive device: Rolling walker (2 wheeled) Gait Pattern/deviations: Step-through pattern;Decreased stride length;Trunk flexed Gait velocity: decreased Gait velocity interpretation: Below normal speed for age/gender General Gait Details: Guarded posture and decreased gait speed due to pain.  Min guard for safety.  No signs of instability using RW.  Stairs Stairs: Yes Stairs assistance: Min guard Stair Management: Two rails;Step to pattern;Forwards Number of Stairs: 4 General stair comments: Min guard for safety.  Daughter present and was educated in proper way to provide assist to pt.  Wheelchair Mobility    Modified Rankin (Stroke Patients Only)       Balance Overall balance assessment: Needs assistance Sitting-balance support: No upper extremity supported;Feet supported Sitting balance-Leahy Scale: Good     Standing balance support: No upper extremity supported;During functional activity Standing balance-Leahy Scale: Fair Standing balance comment: Pt able to stand statically without UE support but requires UE support for dynamic activities                             Pertinent Vitals/Pain Pain Assessment: Faces Faces Pain Scale: Hurts whole lot Pain Location: pelvis Pain Descriptors / Indicators: Grimacing;Guarding;Aching Pain Intervention(s): Limited activity within patient's tolerance;Monitored during session;Repositioned    Home Living Family/patient expects to be discharged to:: Private residence Living Arrangements: Alone Available Help at Discharge: Family;Available 24 hours/day Type of Home: House Home Access: Stairs to enter Entrance Stairs-Rails: Left;Right;Can reach both  Entrance Stairs-Number of Steps: 2 Home Layout: Two level;Able to live on main level with bedroom/bathroom Home Equipment: Walker - standard      Prior  Function Level of Independence: Independent         Comments: Pt ind with all aspects of mobility.  Denies any falls in the past 6 months.  Played 9 holes of golf just a few days before admission.     Hand Dominance        Extremity/Trunk Assessment   Upper Extremity Assessment Upper Extremity Assessment: Defer to OT evaluation    Lower Extremity Assessment Lower Extremity Assessment: RLE deficits/detail;LLE deficits/detail RLE Deficits / Details: Strength limited to 4/5 due to pain RLE: Unable to fully assess due to pain LLE Deficits / Details: Strength limited to 3+/5 due to pain LLE: Unable to fully assess due to pain    Cervical / Trunk Assessment Cervical / Trunk Assessment: Kyphotic  Communication   Communication: HOH  Cognition Arousal/Alertness: Awake/alert Behavior During Therapy: WFL for tasks assessed/performed Overall Cognitive Status: Within Functional Limits for tasks assessed                                        General Comments General comments (skin integrity, edema, etc.): Daughter present throughout Evaluation.    Exercises General Exercises - Lower Extremity Ankle Circles/Pumps: AROM;Both;10 reps;Supine Straight Leg Raises: AROM;Both;5 reps;Supine   Assessment/Plan    PT Assessment Patient needs continued PT services  PT Problem List Decreased strength;Decreased activity tolerance;Decreased balance;Decreased mobility;Decreased knowledge of use of DME;Decreased safety awareness;Pain       PT Treatment Interventions DME instruction;Gait training;Stair training;Functional mobility training;Therapeutic activities;Therapeutic exercise;Balance training;Patient/family education;Modalities    PT Goals (Current goals can be found in the Care Plan section)  Acute Rehab PT Goals Patient Stated Goal: to go home and return to PLOF PT Goal Formulation: With patient Time For Goal Achievement: 03/23/17 Potential to Achieve Goals:  Good    Frequency 7X/week   Barriers to discharge        Co-evaluation               AM-PAC PT "6 Clicks" Daily Activity  Outcome Measure Difficulty turning over in bed (including adjusting bedclothes, sheets and blankets)?: Total Difficulty moving from lying on back to sitting on the side of the bed? : Total Difficulty sitting down on and standing up from a chair with arms (e.g., wheelchair, bedside commode, etc,.)?: A Little Help needed moving to and from a bed to chair (including a wheelchair)?: A Little Help needed walking in hospital room?: A Little Help needed climbing 3-5 steps with a railing? : A Little 6 Click Score: 14    End of Session Equipment Utilized During Treatment: Gait belt Activity Tolerance: Patient tolerated treatment well;Patient limited by pain (1 brief episode of nausea after stairs) Patient left: in chair;with call bell/phone within reach;with chair alarm set;with family/visitor present Nurse Communication: Mobility status;Other (comment) (1 brief episode of nausea after stairs) PT Visit Diagnosis: Pain;Difficulty in walking, not elsewhere classified (R26.2) Pain - Right/Left: Left Pain - part of body:  (pelvis)    Time: 1950-9326 PT Time Calculation (min) (ACUTE ONLY): 50 min   Charges:   PT Evaluation $PT Eval Low Complexity: 1 Procedure PT Treatments $Gait Training: 8-22 mins $Therapeutic Activity: 8-22 mins   PT G Codes:   PT G-Codes **NOT FOR INPATIENT CLASS** Functional Assessment  Tool Used: Clinical judgement;AM-PAC 6 Clicks Basic Mobility Functional Limitation: Mobility: Walking and moving around Mobility: Walking and Moving Around Current Status [phone]): At least 40 percent but less than 60 percent impaired, limited or restricted Mobility: Walking and Moving Around Goal Status [phone]): At least 20 percent but less than 40 percent impaired, limited or restricted    Collie Siad PT, DPT 03/09/2017, 10:39 AM

## 2017-03-09 NOTE — Clinical Social Work Note (Signed)
Clinical Social Work Assessment  Patient Details  Name: [name] MRN: [id number] Date of Birth: Dec 22, 1925  Date of referral:  03/09/17               Reason for consult:  Facility Placement                Permission sought to share information with:    Permission granted to share information::     Name::        Agency::     Relationship::     Contact Information:     Housing/Transportation Living arrangements for the past 2 months:  Single Family Home Source of Information:  Power of Attorney Patient Interpreter Needed:  None Criminal Activity/Legal Involvement Pertinent to Current Situation/Hospitalization:  No - Comment as needed Significant Relationships:  Adult Children Lives with:  Self Do you feel safe going back to the place where you live?  Yes Need for family participation in patient care:  Yes (Comment)  Care giving concerns:  Patient lives alone in [place].    Social Worker assessment / plan:  Holiday representative (CSW) was approached by patient's daughter [name] to discuss D/C plan. PT is recommending home health. Per daughter patient lives alone and daughter lives near by. CSW explained that patient is under medicare observation and medicare will not pay for SNF. CSW explained private pay SNF option or private pay sitters for at home with home health. Per daughter patient can't pay privately for SNF and they will pay for sitters at home. CSW provided daughter with private duty Advertising copywriter. RN case manager aware of above. Please reconsult if future social work needs arise. CSW signing off.   Employment status:  Retired Health visitor PT Recommendations:  Home with [place], [place] / Referral to community resources:  Other (Comment Required) (Patient is under medicare observation and can't pay for SNF privately. )  Patient/Family's Response to care:  Patient's daughter [name] is agreeable for patient to D/C home  with home health.   Patient/Family's Understanding of and Emotional Response to Diagnosis, Current Treatment, and Prognosis:  Patient's daughter was very pleasant and thanked CSW for assistance.   Emotional Assessment Appearance:  Appears stated age Attitude/Demeanor/Rapport:    Affect (typically observed):  Pleasant Orientation:  Oriented to Self, Oriented to Place, Oriented to  Time, Oriented to Situation Alcohol / Substance use:  Not Applicable Psych involvement (Current and /or in the community):  No (Comment)  Discharge Needs  Concerns to be addressed:  Discharge Planning Concerns Readmission within the last 30 days:  No Current discharge risk:  Dependent with Mobility Barriers to Discharge:  No Barriers Identified   Lysha Schrade, Veronia Beets, LCSW 03/09/2017, 3:08 PM

## 2017-03-09 NOTE — Care Management Note (Signed)
Case Management Note  Patient Details  Name: [name] MRN: [id number] Date of Birth: [birth year]  Subjective/Objective:  Admitted with a fall and pelvic fracture. Met with daughter. Gave list of private sitters. She plans to hire them to stay with her mother and assist with her care Offered choice of home heath agencies. [facility]erral to [facility] for [facility]. Daughter refused a [facility]. Ordered a walker from [facility].PCP is Dr. Derrel Nip.                     Action/Plan: [facility] for PT and walker  Expected Discharge Date:  03/10/17               Expected Discharge Plan:  [place]  In-House Referral:     Discharge planning Services  CM Consult  Post Acute Care Choice:  Durable Medical Equipment, Home Health Choice offered to:  Adult Children  DME Arranged:  Walker rolling DME Agency:  [facility] Arranged:  PT [place] Agency:  [facility]  Status of Service:  In process, will continue to follow  If discussed at Long Length of Stay Meetings, dates discussed:    Additional Comments:  Jolly Mango, RN 03/09/2017, 1:45 PM

## 2017-03-09 NOTE — Progress Notes (Signed)
[facility] [place] NAME: [name]    MR#:  [id number]  DATE OF BIRTH:  May 15, 1926  SUBJECTIVE:  CHIEF COMPLAINT:    REVIEW OF SYSTEMS:  CONSTITUTIONAL: No fever, fatigue or weakness.  EYES: No blurred or double vision.  EARS, NOSE, AND THROAT: No tinnitus or ear pain.  RESPIRATORY: No cough, shortness of breath, wheezing or hemoptysis.  CARDIOVASCULAR: No chest pain, orthopnea, edema.  GASTROINTESTINAL: No nausea, vomiting, diarrhea or abdominal pain.  GENITOURINARY: No dysuria, hematuria.  ENDOCRINE: No polyuria, nocturia,  HEMATOLOGY: No anemia, easy bruising or bleeding SKIN: No rash or lesion. MUSCULOSKELETAL: No joint pain or arthritis.   NEUROLOGIC: No tingling, numbness, weakness.  PSYCHIATRY: No anxiety or depression.   DRUG ALLERGIES:   Allergies  Allergen Reactions  . Codeine Nausea And Vomiting    VITALS:  Blood pressure (!) 120/43, pulse (!) 52, temperature 98.5 F (36.9 C), temperature source Oral, resp. rate 18, weight 58.1 kg (128 lb), SpO2 93 %.  PHYSICAL EXAMINATION:  GENERAL:  83 y.o.-year-old patient lying in the bed with no acute distress.  EYES: Pupils equal, round, reactive to light and accommodation. No scleral icterus. Extraocular muscles intact.  HEENT: Head atraumatic, normocephalic. Oropharynx and nasopharynx clear.  NECK:  Supple, no jugular venous distention. No thyroid enlargement, no tenderness.  LUNGS: Normal breath sounds bilaterally, no wheezing, rales,rhonchi or crepitation. No use of accessory muscles of respiration.  CARDIOVASCULAR: S1, S2 normal. No murmurs, rubs, or gallops.  ABDOMEN: Soft, nontender, nondistended. Bowel sounds present. No organomegaly or mass.  EXTREMITIES: No pedal edema, cyanosis, or clubbing.  NEUROLOGIC: Cranial nerves II through XII are intact. Muscle strength 5/5 in all extremities. Sensation intact. Gait not checked.  PSYCHIATRIC: The patient is  alert and oriented x 3.  SKIN: No obvious rash, lesion, or ulcer.    LABORATORY PANEL:   CBC  Recent Labs Lab 03/09/17 0510  WBC 5.3  HGB 12.4  HCT 37.3  PLT 119*   ------------------------------------------------------------------------------------------------------------------  Chemistries   Recent Labs Lab 03/09/17 0510  NA 141  K 4.0  CL 111  CO2 27  GLUCOSE 111*  BUN 23*  CREATININE 1.10*  CALCIUM 8.3*   ------------------------------------------------------------------------------------------------------------------  Cardiac Enzymes  Recent Labs Lab 03/08/17 1549  TROPONINI <0.03   ------------------------------------------------------------------------------------------------------------------  RADIOLOGY:  Dg Pelvis 1-2 Views  Result Date: 03/08/2017 CLINICAL DATA:  False morning with left hip pain, initial encounter EXAM: PELVIS - 1 VIEW COMPARISON:  None. FINDINGS: The pelvic ring is intact. The proximal femurs are within normal limits. Minimal irregularity is noted along the superior pubic ramus on the left which may represent an undisplaced fracture. No other fracture is seen. Degenerative changes of the lumbar spine are noted. No soft tissue abnormality is seen. IMPRESSION: Suspicious for undisplaced left superior pubic ramus fracture. Electronically Signed   By: Inez Catalina M.D.   On: 03/08/2017 15:16   Dg Shoulder Left  Result Date: 03/08/2017 CLINICAL DATA:  Recent fall with left shoulder pain, initial encounter EXAM: LEFT SHOULDER - 2+ VIEW COMPARISON:  None. FINDINGS: No acute fracture or dislocation is noted. Underlying bony thorax is within normal limits. No soft tissue abnormality is seen. IMPRESSION: No acute abnormality noted. Electronically Signed   By: Inez Catalina M.D.   On: 03/08/2017 15:16    EKG:   Orders placed or performed in visit on 01/16/17  . EKG 12-Lead    ASSESSMENT AND PLAN:   [name]  is a 83 y.o. female with  a known history of  CAD status post CABG, COPD not on home oxygen, diverticulosis, GERD, hypertension presents to the hospital secondary to a fall.  #1 fall and left pubic rami fracture-  -Pain management -Physical therapy is recommending home health PT -Social worker consult   # aki-improving creatinine 1.40-1.10 Avoid nephrotoxins  #hypertension-continue Norvasc and Cozaar  # CAD-stable. Patient on aspirin, Plavix and statin. Denies any chest pain at this time.  # GERD-on Protonix   #thrombocytopenia Monitor platelet count closely and 142,000-1 19,000 and will discontinue Lovenox when platelet count is less than 100,000    #5 DVT prophylaxis on Lovenox     All the records are reviewed and case discussed with Care Management/Social Workerr. Management plans discussed with the patient, family and they are in agreement.  CODE STATUS: FC   TOTAL TIME TAKING CARE OF THIS PATIENT: 36  minutes.   POSSIBLE D/C IN 1-2  DAYS, DEPENDING ON CLINICAL CONDITION.  Note: This dictation was prepared with Dragon dictation along with smaller phrase technology. Any transcriptional errors that result from this process are unintentional.   Nicholes Mango M.D on 03/09/2017 at 12:56 PM  Between 7am to 6pm - Pager - [phone] After 6pm go to [email or web address] - password [email or web address] [facility]  Office  [phone]  CC: Primary care physician; [name], MD

## 2017-03-10 ENCOUNTER — Telehealth: Payer: Self-pay | Admitting: Internal Medicine

## 2017-03-10 DIAGNOSIS — I2510 Atherosclerotic heart disease of native coronary artery without angina pectoris: Secondary | ICD-10-CM | POA: Diagnosis not present

## 2017-03-10 DIAGNOSIS — S329XXA Fracture of unspecified parts of lumbosacral spine and pelvis, initial encounter for closed fracture: Secondary | ICD-10-CM | POA: Diagnosis not present

## 2017-03-10 DIAGNOSIS — J449 Chronic obstructive pulmonary disease, unspecified: Secondary | ICD-10-CM | POA: Diagnosis not present

## 2017-03-10 DIAGNOSIS — I10 Essential (primary) hypertension: Secondary | ICD-10-CM | POA: Diagnosis not present

## 2017-03-10 DIAGNOSIS — S32512A Fracture of superior rim of left pubis, initial encounter for closed fracture: Secondary | ICD-10-CM | POA: Diagnosis not present

## 2017-03-10 LAB — CBC
HCT: 32.5 % — ABNORMAL LOW (ref 35.0–47.0)
Hemoglobin: 10.9 g/dL — ABNORMAL LOW (ref 12.0–16.0)
MCH: 32.3 pg (ref 26.0–34.0)
MCHC: 33.6 g/dL (ref 32.0–36.0)
MCV: 96.2 fL (ref 80.0–100.0)
PLATELETS: 118 10*3/uL — AB (ref 150–440)
RBC: 3.38 MIL/uL — ABNORMAL LOW (ref 3.80–5.20)
RDW: 12.8 % (ref 11.5–14.5)
WBC: 5.2 10*3/uL (ref 3.6–11.0)

## 2017-03-10 LAB — BASIC METABOLIC PANEL
Anion gap: 5 (ref 5–15)
BUN: 16 mg/dL (ref 6–20)
CALCIUM: 8.4 mg/dL — AB (ref 8.9–10.3)
CO2: 27 mmol/L (ref 22–32)
CREATININE: 1.11 mg/dL — AB (ref 0.44–1.00)
Chloride: 108 mmol/L (ref 101–111)
GFR calc Af Amer: 50 mL/min — ABNORMAL LOW (ref >60)
GFR, EST NON AFRICAN AMERICAN: 43 mL/min — AB (ref >60)
Glucose, Bld: 105 mg/dL — ABNORMAL HIGH (ref 65–99)
Potassium: 4 mmol/L (ref 3.5–5.1)
SODIUM: 140 mmol/L (ref 135–145)

## 2017-03-10 MED ORDER — ACETAMINOPHEN 325 MG PO TABS: 650.0000 mg | ORAL_TABLET | Freq: Four times a day (QID) | ORAL | Status: DC | PRN

## 2017-03-10 MED ORDER — POLYETHYLENE GLYCOL 3350 17 G PO PACK: 17.0000 g | PACK | Freq: Every day | ORAL | 0 refills | Status: DC

## 2017-03-10 MED ORDER — DOCUSATE SODIUM 100 MG PO CAPS: 100.0000 mg | ORAL_CAPSULE | Freq: Two times a day (BID) | ORAL | 0 refills | Status: DC | PRN

## 2017-03-10 MED ORDER — METAXALONE 400 MG PO TABS: 400.0000 mg | ORAL_TABLET | Freq: Three times a day (TID) | ORAL | 0 refills | Status: DC

## 2017-03-10 MED ORDER — HYDROCODONE-ACETAMINOPHEN 5-325 MG PO TABS: 1.0000 | ORAL_TABLET | ORAL | 0 refills | Status: DC | PRN

## 2017-03-10 NOTE — Care Management Note (Signed)
Case Management Note  Patient Details  Name: [name] MRN: [id number] Date of Birth: 01/01/1926  Subjective/Objective: Discharging today                   Action/Plan: [facility] notified of discharge  Expected Discharge Date:  03/10/17               Expected Discharge Plan:  [name]  In-House Referral:     Discharge planning Services  CM Consult  Post Acute Care Choice:  Durable Medical Equipment, Home Health Choice offered to:  Adult Children  DME Arranged:  Walker rolling DME Agency:  [facility] Arranged:  PT [place] Agency:  [facility]  Status of Service:  Completed, signed off  If discussed at [place] of Stay Meetings, dates discussed:    Additional Comments:  Jolly Mango, RN 03/10/2017, 10:50 AM

## 2017-03-10 NOTE — Telephone Encounter (Signed)
[name] from [facility] called and needed to make a HFU for 5 days for pt for a pelvic fx. Please advise, thank you!

## 2017-03-10 NOTE — Telephone Encounter (Signed)
Call patient and give her the option. Sometimes pain control is the issue; you can use Monday night if needed

## 2017-03-10 NOTE — Discharge Summary (Signed)
[facility] NAME: [name]    MR#:  [id number]  DATE OF BIRTH:  Nov 02, 1925  DATE OF ADMISSION:  03/08/2017 ADMITTING PHYSICIAN: Gladstone Lighter, MD  DATE OF DISCHARGE:  6//22/18 PRIMARY CARE PHYSICIAN: [name], MD    ADMISSION DIAGNOSIS:  Fall, initial encounter [phone]] Closed fracture of superior ramus of left pubis, initial encounter ([place]) [S32.512A]  DISCHARGE DIAGNOSIS:  Active Problems:   Pelvic fracture (HCC)   SECONDARY DIAGNOSIS:   Past Medical History:  Diagnosis Date  . C. difficile colitis   . CAD (coronary artery disease)    a. Nuclear 10/11, not gated, no scar or ischemia, EF 68%, low risk study; b. [facility] 06/18/14: no evidence of infarction or ischemia, no WMA, normalization of TWI in precordial leads, equivocal EKG changes EF 77%, low risk study  . Carotid artery disease ([place])    Doppler January, 2012, 40-59% bilateral, followup one year  . Diffuse cystic mastopathy   . Diverticulitis    last colonoscopy  incomplete Jan 2011  . Dizziness    stable now (Oct 2011) patient tells me question of TIA, we will obtain records  . Gait abnormality    Patient complains of "wobbly gait", December, 2013  . GERD (gastroesophageal reflux disease)   . History of migraines   . Hx of CABG    a. 3 vessel in 1999  . Hyperlipidemia   . Hypertension   . Indigestion    3 weeks, October 2011  . Kidney stones   . Rheumatic fever   . Sinus bradycardia    Mild, December, 2013  . SOB (shortness of breath)   . Syncopal episodes    after 18 holes of golf and increase hydrochlorothiazide, resolved     HOSPITAL COURSE:  Caretha Allen  is a 83 y.o. female with a known history of  CAD status post CABG, COPD not on home oxygen, diverticulosis, GERD, hypertension presents to the hospital secondary to a fall. Patient has been doing very well recently, she was [place] the beach today when she  tripped and fell over a cord and started complaining of significant left-sided hip pain. Family was able to put her in the car and drive for 3 hours to get back home. However she was unable to get out of the car here and so was brought to the emergency room. She complains of significant left shoulder pain and also left hip pain. X-rays revealed no shoulder injury or fractures, however has left superior pubic ram I fracture. She is being admitted for pain control and physical therapy evaluation. Denies any chest pain, dizziness. Independent at baseline and lives by herself. Denies any syncopal episode today.  #1 fall and left pubic rami fracture-  -Pain management -Physical therapy is recommending home health PT, 24 hour supervision -Social worker consult for home health PT   # aki-improving creatinine 1.40-1.10 Avoid nephrotoxins  #hypertension-continue Norvasc and Cozaar  # CAD-stable. Patient on aspirin, Plavix and statin. Denies any chest pain at this time.  # GERD-on Protonix   #thrombocytopenia Monitor platelet count closely and 142,000-1 19,000--1 18,000, no bleeding or bruising noticed     #5 DVT prophylaxis on Lovenox      DISCHARGE CONDITIONS:   fair  CONSULTS OBTAINED:     PROCEDURES  None   DRUG ALLERGIES:   Allergies  Allergen Reactions  . Codeine Nausea And Vomiting    DISCHARGE MEDICATIONS:   Current  Discharge Medication List    START taking these medications   Details  acetaminophen (TYLENOL) 325 MG tablet Take 2 tablets (650 mg total) by mouth every 6 (six) hours as needed for mild pain (or Fever >/= 101).    docusate sodium (COLACE) 100 MG capsule Take 1 capsule (100 mg total) by mouth 2 (two) times daily as needed for mild constipation. Qty: 10 capsule, Refills: 0    HYDROcodone-acetaminophen (NORCO/VICODIN) 5-325 MG tablet Take 1 tablet by mouth every 4 (four) hours as needed for moderate pain. Qty: 20 tablet, Refills: 0     metaxalone (SKELAXIN) 400 MG tablet Take 1 tablet (400 mg total) by mouth 3 (three) times daily. Qty: 20 tablet, Refills: 0    polyethylene glycol (MIRALAX / GLYCOLAX) packet Take 17 g by mouth daily. Qty: 14 each, Refills: 0      CONTINUE these medications which have NOT CHANGED   Details  ALPRAZolam (XANAX) 0.25 MG tablet 1 to 2 tablets at bedtime as needed for insomnia Qty: 60 tablet, Refills: 5    amLODipine (NORVASC) 2.5 MG tablet TAKE 1 TABLET EVERY DAY Qty: 90 tablet, Refills: 1    aspirin 81 MG tablet Take 81 mg by mouth daily.      clopidogrel (PLAVIX) 75 MG tablet TAKE 1 TABLET BY MOUTH ONE TIME DAILY Qty: 90 tablet, Refills: 0    esomeprazole (NEXIUM) 40 MG capsule TAKE 1 CAPSULE DAILY BEFORE BREAKFAST Qty: 90 capsule, Refills: 1    losartan (COZAAR) 100 MG tablet TAKE 1 TABLET EVERY DAY Qty: 90 tablet, Refills: 0    nitroGLYCERIN (NITROSTAT) 0.4 MG SL tablet Place 1 tablet (0.4 mg total) under the tongue every 5 (five) minutes as needed for chest pain. Qty: 25 tablet, Refills: 6    simvastatin (ZOCOR) 40 MG tablet Take 1 tablet (40 mg total) by mouth at bedtime. Qty: 90 tablet, Refills: 3    Ipratropium-Albuterol (COMBIVENT RESPIMAT) 20-100 MCG/ACT AERS respimat Inhale 1 puff into the lungs every 6 (six) hours. Qty: 1 Inhaler, Refills: 10         DISCHARGE INSTRUCTIONS:   Continue home health PT with 24-hour supervision Follow-up with primary care physician in 5 days or sooner as needed  DIET:  Cardiac diet  DISCHARGE CONDITION:  Fair  ACTIVITY:  Activity as tolerated  OXYGEN:  Home Oxygen: No.   Oxygen Delivery: room air  DISCHARGE LOCATION:  home   If you experience worsening of your admission symptoms, develop shortness of breath, life threatening emergency, suicidal or homicidal thoughts you must seek medical attention immediately by calling 911 or calling your MD immediately  if symptoms less severe.  You Must read complete  instructions/literature along with all the possible adverse reactions/side effects for all the Medicines you take and that have been prescribed to you. Take any new Medicines after you have completely understood and accpet all the possible adverse reactions/side effects.   Please note  You were cared for by a hospitalist during your hospital stay. If you have any questions about your discharge medications or the care you received while you were in the hospital after you are discharged, you can call the unit and asked to speak with the hospitalist on call if the hospitalist that took care of you is not available. Once you are discharged, your primary care physician will handle any further medical issues. Please note that NO REFILLS for any discharge medications will be authorized once you are discharged, as it is imperative that  you return to your primary care physician (or establish a relationship with a primary care physician if you do not have one) for your aftercare needs so that they can reassess your need for medications and monitor your lab values.     Today  Chief Complaint  Patient presents with  . Fall   Patient is feeling fine. Pain is well controlled with the current pain medication regimen. Wants to go home. Daughter is arranging health to supervise the patient 24 7  ROS:  CONSTITUTIONAL: Denies fevers, chills. Denies any fatigue, weakness.  EYES: Denies blurry vision, double vision, eye pain. EARS, NOSE, THROAT: Denies tinnitus, ear pain, hearing loss. RESPIRATORY: Denies cough, wheeze, shortness of breath.  CARDIOVASCULAR: Denies chest pain, palpitations, edema.  GASTROINTESTINAL: Denies nausea, vomiting, diarrhea, abdominal pain. Denies bright red blood per rectum. GENITOURINARY: Denies dysuria, hematuria. ENDOCRINE: Denies nocturia or thyroid problems. HEMATOLOGIC AND LYMPHATIC: Denies easy bruising or bleeding. SKIN: Denies rash or lesion. MUSCULOSKELETAL: Pain in the  pelvic area  NEUROLOGIC: Denies paralysis, paresthesias.  PSYCHIATRIC: Denies anxiety or depressive symptoms.   VITAL SIGNS:  Blood pressure (!) 145/62, pulse 63, temperature 98.7 F (37.1 C), temperature source Oral, resp. rate 18, weight 58.1 kg (128 lb), SpO2 94 %.  I/O:    Intake/Output Summary (Last 24 hours) at 03/10/17 1044 Last data filed at 03/10/17 0947  Gross per 24 hour  Intake              240 ml  Output              600 ml  Net             -360 ml    PHYSICAL EXAMINATION:  GENERAL:  84 y.o.-year-old patient lying in the bed with no acute distress.  EYES: Pupils equal, round, reactive to light and accommodation. No scleral icterus. Extraocular muscles intact.  HEENT: Head atraumatic, normocephalic. Oropharynx and nasopharynx clear.  NECK:  Supple, no jugular venous distention. No thyroid enlargement, no tenderness.  LUNGS: Normal breath sounds bilaterally, no wheezing, rales,rhonchi or crepitation. No use of accessory muscles of respiration.  CARDIOVASCULAR: S1, S2 normal. No murmurs, rubs, or gallops.  ABDOMEN: Soft, non-tender, non-distended. Bowel sounds present. No organomegaly or mass.  EXTREMITIES: Tender in the hip joint and pelvic area No pedal edema, cyanosis, or clubbing.  NEUROLOGIC: Awake and alert oriented 3 gait not checked PSYCHIATRIC: The patient is alert and oriented x 3.  SKIN: No obvious rash, lesion, or ulcer.   DATA REVIEW:   CBC  Recent Labs Lab 03/10/17 0609  WBC 5.2  HGB 10.9*  HCT 32.5*  PLT 118*    Chemistries   Recent Labs Lab 03/10/17 0609  NA 140  K 4.0  CL 108  CO2 27  GLUCOSE 105*  BUN 16  CREATININE 1.11*  CALCIUM 8.4*    Cardiac Enzymes  Recent Labs Lab 03/08/17 1549  TROPONINI <0.03    Microbiology Results  Results for orders placed or performed in visit on 01/19/17  Clostridium Difficile by PCR     Status: None   Collection Time: 01/19/17  3:19 PM  Result Value Ref Range Status   Toxigenic C  Difficile by pcr Not Detected Not Detected Final    Comment: This test is for use only with liquid or soft stools; performance characteristics of other clinical specimen types have not been established.   This assay was performed by Cepheid GeneXpert(R) PCR. The performance characteristics of this assay have been  determined by [employer]. Performance characteristics refer to the analytical performance of the test.   Stool culture     Status: None   Collection Time: 01/19/17  3:19 PM  Result Value Ref Range Status   Organism ID, Bacteria NO SALMONELLA OR SHIGELLA ISOLATED  Final    Comment: NO ENTERIC CAMPYLOBACTER ISOLATED NO ESCHERICHIA COLI 0157 ISOLATED     RADIOLOGY:  Dg Pelvis 1-2 Views  Result Date: 03/08/2017 CLINICAL DATA:  False morning with left hip pain, initial encounter EXAM: PELVIS - 1 VIEW COMPARISON:  None. FINDINGS: The pelvic ring is intact. The proximal femurs are within normal limits. Minimal irregularity is noted along the superior pubic ramus on the left which may represent an undisplaced fracture. No other fracture is seen. Degenerative changes of the lumbar spine are noted. No soft tissue abnormality is seen. IMPRESSION: Suspicious for undisplaced left superior pubic ramus fracture. Electronically Signed   By: Inez Catalina M.D.   On: 03/08/2017 15:16   Dg Shoulder Left  Result Date: 03/08/2017 CLINICAL DATA:  Recent fall with left shoulder pain, initial encounter EXAM: LEFT SHOULDER - 2+ VIEW COMPARISON:  None. FINDINGS: No acute fracture or dislocation is noted. Underlying bony thorax is within normal limits. No soft tissue abnormality is seen. IMPRESSION: No acute abnormality noted. Electronically Signed   By: Inez Catalina M.D.   On: 03/08/2017 15:16    EKG:   Orders placed or performed in visit on 01/16/17  . EKG 12-Lead      Management plans discussed with the patient, family and they are in agreement.  CODE STATUS:     Code Status  Orders        Start     Ordered   03/08/17 1847  Full code  Continuous     03/08/17 1846    Code Status History    Date Active Date Inactive Code Status Order ID Comments User Context   12/21/2016  9:59 PM 12/23/2016  1:42 PM Full Code [id number]  Gladstone Lighter, MD ED    Advance Directive Documentation     Most Recent Value  Type of Advance Directive  Living will  Pre-existing out of facility DNR order (yellow form or pink MOST form)  -  "MOST" Form in Place?  -      TOTAL TIME TAKING CARE OF THIS PATIENT: 45  minutes.   Note: This dictation was prepared with Dragon dictation along with smaller phrase technology. Any transcriptional errors that result from this process are unintentional.   @[facility] @  on 03/10/2017 at 10:44 AM  Between 7am to 6pm - Pager - [phone]  After 6pm go to [email or web address] - password [email or web address] [facility]  Office  [phone]  CC: Primary care physician; [name], MD

## 2017-03-10 NOTE — Telephone Encounter (Signed)
Attempted to reach the patient, left a VM, thanks

## 2017-03-10 NOTE — Discharge Planning (Signed)
Patient IV removed.  RN assessment and VS revealed stability for DC to home.  Discharge papers given, explained and educated.  Informed of suggested appts and appts made.  Given scripts and told of remainder E-scripted to [facility], [place], [place].  Once ready, will be wheeled to front and family transporting home via car.

## 2017-03-10 NOTE — Discharge Instructions (Signed)
Continue home health PT with 24-hour supervision Follow-up with primary care physician in 5 days or sooner as needed

## 2017-03-10 NOTE — Telephone Encounter (Signed)
She was only on observation status so no TCM, has a follow up on July 3rd, would you like to see her sooner? thanks

## 2017-03-11 DIAGNOSIS — D696 Thrombocytopenia, unspecified: Secondary | ICD-10-CM | POA: Diagnosis not present

## 2017-03-11 DIAGNOSIS — S32512D Fracture of superior rim of left pubis, subsequent encounter for fracture with routine healing: Secondary | ICD-10-CM | POA: Diagnosis not present

## 2017-03-11 DIAGNOSIS — J449 Chronic obstructive pulmonary disease, unspecified: Secondary | ICD-10-CM | POA: Diagnosis not present

## 2017-03-11 DIAGNOSIS — W19XXXD Unspecified fall, subsequent encounter: Secondary | ICD-10-CM | POA: Diagnosis not present

## 2017-03-11 DIAGNOSIS — R2689 Other abnormalities of gait and mobility: Secondary | ICD-10-CM | POA: Diagnosis not present

## 2017-03-11 DIAGNOSIS — K219 Gastro-esophageal reflux disease without esophagitis: Secondary | ICD-10-CM | POA: Diagnosis not present

## 2017-03-11 DIAGNOSIS — Z9181 History of falling: Secondary | ICD-10-CM | POA: Diagnosis not present

## 2017-03-11 DIAGNOSIS — Z7902 Long term (current) use of antithrombotics/antiplatelets: Secondary | ICD-10-CM | POA: Diagnosis not present

## 2017-03-11 DIAGNOSIS — I10 Essential (primary) hypertension: Secondary | ICD-10-CM | POA: Diagnosis not present

## 2017-03-11 DIAGNOSIS — M25512 Pain in left shoulder: Secondary | ICD-10-CM | POA: Diagnosis not present

## 2017-03-11 DIAGNOSIS — I2510 Atherosclerotic heart disease of native coronary artery without angina pectoris: Secondary | ICD-10-CM | POA: Diagnosis not present

## 2017-03-11 DIAGNOSIS — Z7982 Long term (current) use of aspirin: Secondary | ICD-10-CM | POA: Diagnosis not present

## 2017-03-11 DIAGNOSIS — Z951 Presence of aortocoronary bypass graft: Secondary | ICD-10-CM | POA: Diagnosis not present

## 2017-03-11 DIAGNOSIS — Z87891 Personal history of nicotine dependence: Secondary | ICD-10-CM | POA: Diagnosis not present

## 2017-03-13 ENCOUNTER — Telehealth: Payer: Self-pay | Admitting: Internal Medicine

## 2017-03-13 ENCOUNTER — Other Ambulatory Visit: Payer: Self-pay | Admitting: Internal Medicine

## 2017-03-13 DIAGNOSIS — M25512 Pain in left shoulder: Secondary | ICD-10-CM | POA: Diagnosis not present

## 2017-03-13 DIAGNOSIS — J449 Chronic obstructive pulmonary disease, unspecified: Secondary | ICD-10-CM | POA: Diagnosis not present

## 2017-03-13 DIAGNOSIS — R2689 Other abnormalities of gait and mobility: Secondary | ICD-10-CM | POA: Diagnosis not present

## 2017-03-13 DIAGNOSIS — S32512D Fracture of superior rim of left pubis, subsequent encounter for fracture with routine healing: Secondary | ICD-10-CM | POA: Diagnosis not present

## 2017-03-13 DIAGNOSIS — I2510 Atherosclerotic heart disease of native coronary artery without angina pectoris: Secondary | ICD-10-CM | POA: Diagnosis not present

## 2017-03-13 DIAGNOSIS — D696 Thrombocytopenia, unspecified: Secondary | ICD-10-CM | POA: Diagnosis not present

## 2017-03-13 NOTE — Telephone Encounter (Signed)
Verbal ok was given to [employer].

## 2017-03-13 NOTE — Telephone Encounter (Signed)
Thank you :)

## 2017-03-13 NOTE — Telephone Encounter (Signed)
[name] from [facility] called to get verbal order for OT for new onset of left shoulder pain and weakness. Please advise, thank you!  Call [facility] @ [phone]

## 2017-03-14 ENCOUNTER — Telehealth: Payer: Self-pay | Admitting: *Deleted

## 2017-03-14 NOTE — Telephone Encounter (Signed)
Tel lher thank you ,  I am aware,  No changes  Needed

## 2017-03-14 NOTE — Telephone Encounter (Signed)
Attempted to reach, left VM

## 2017-03-14 NOTE — Telephone Encounter (Signed)
Spoke with Stanton Kidney and informed her that Dr. Derrel Nip is aware of the interactions and that no changes are needed. Stanton Kidney gave a verbal understanding.

## 2017-03-14 NOTE — Telephone Encounter (Signed)
[name] from [name] reported a level one medication interaction for amlodipine and simvastatin pt has been on this medication for a long time with has no side effects. Please call [name] with [facility] with changes or no changes.    Patient declined services for a bath aid .

## 2017-03-14 NOTE — Telephone Encounter (Signed)
Please advise 

## 2017-03-15 ENCOUNTER — Encounter: Payer: Self-pay | Admitting: Internal Medicine

## 2017-03-15 ENCOUNTER — Ambulatory Visit: Payer: Medicare Other

## 2017-03-15 DIAGNOSIS — I6523 Occlusion and stenosis of bilateral carotid arteries: Secondary | ICD-10-CM

## 2017-03-16 DIAGNOSIS — I2510 Atherosclerotic heart disease of native coronary artery without angina pectoris: Secondary | ICD-10-CM | POA: Diagnosis not present

## 2017-03-16 DIAGNOSIS — R2689 Other abnormalities of gait and mobility: Secondary | ICD-10-CM | POA: Diagnosis not present

## 2017-03-16 DIAGNOSIS — J449 Chronic obstructive pulmonary disease, unspecified: Secondary | ICD-10-CM | POA: Diagnosis not present

## 2017-03-16 DIAGNOSIS — M25512 Pain in left shoulder: Secondary | ICD-10-CM | POA: Diagnosis not present

## 2017-03-16 DIAGNOSIS — D696 Thrombocytopenia, unspecified: Secondary | ICD-10-CM | POA: Diagnosis not present

## 2017-03-16 DIAGNOSIS — S32512D Fracture of superior rim of left pubis, subsequent encounter for fracture with routine healing: Secondary | ICD-10-CM | POA: Diagnosis not present

## 2017-03-20 ENCOUNTER — Telehealth: Payer: Self-pay | Admitting: *Deleted

## 2017-03-20 DIAGNOSIS — S32512D Fracture of superior rim of left pubis, subsequent encounter for fracture with routine healing: Secondary | ICD-10-CM | POA: Diagnosis not present

## 2017-03-20 DIAGNOSIS — I2510 Atherosclerotic heart disease of native coronary artery without angina pectoris: Secondary | ICD-10-CM | POA: Diagnosis not present

## 2017-03-20 DIAGNOSIS — J449 Chronic obstructive pulmonary disease, unspecified: Secondary | ICD-10-CM | POA: Diagnosis not present

## 2017-03-20 DIAGNOSIS — R2689 Other abnormalities of gait and mobility: Secondary | ICD-10-CM | POA: Diagnosis not present

## 2017-03-20 DIAGNOSIS — M25512 Pain in left shoulder: Secondary | ICD-10-CM | POA: Diagnosis not present

## 2017-03-20 DIAGNOSIS — D696 Thrombocytopenia, unspecified: Secondary | ICD-10-CM | POA: Diagnosis not present

## 2017-03-20 NOTE — Telephone Encounter (Signed)
Thanks

## 2017-03-20 NOTE — Telephone Encounter (Signed)
There is still the 4:30 spot open. Please advise.

## 2017-03-20 NOTE — Telephone Encounter (Signed)
Yes, you can use it

## 2017-03-20 NOTE — Telephone Encounter (Signed)
The patient has been scheduled and notified.  °

## 2017-03-20 NOTE — Telephone Encounter (Signed)
Patient accidentally cancelled her appointment via-automatic system. She is now requesting to still come in on 03/21/17 . Please advise  Pt contact [phone]

## 2017-03-21 ENCOUNTER — Ambulatory Visit: Payer: Medicare Other | Admitting: Internal Medicine

## 2017-03-21 ENCOUNTER — Ambulatory Visit (INDEPENDENT_AMBULATORY_CARE_PROVIDER_SITE_OTHER): Payer: Medicare Other | Admitting: Internal Medicine

## 2017-03-21 ENCOUNTER — Encounter: Payer: Self-pay | Admitting: Internal Medicine

## 2017-03-21 VITALS — BP 134/74 | HR 75 | Temp 98.2°F | Resp 15 | Ht 63.0 in | Wt 128.2 lb

## 2017-03-21 DIAGNOSIS — D696 Thrombocytopenia, unspecified: Secondary | ICD-10-CM | POA: Diagnosis not present

## 2017-03-21 DIAGNOSIS — M25512 Pain in left shoulder: Secondary | ICD-10-CM | POA: Diagnosis not present

## 2017-03-21 DIAGNOSIS — S32512D Fracture of superior rim of left pubis, subsequent encounter for fracture with routine healing: Secondary | ICD-10-CM | POA: Diagnosis not present

## 2017-03-21 DIAGNOSIS — M8580 Other specified disorders of bone density and structure, unspecified site: Secondary | ICD-10-CM

## 2017-03-21 DIAGNOSIS — I6523 Occlusion and stenosis of bilateral carotid arteries: Secondary | ICD-10-CM

## 2017-03-21 DIAGNOSIS — I2510 Atherosclerotic heart disease of native coronary artery without angina pectoris: Secondary | ICD-10-CM | POA: Diagnosis not present

## 2017-03-21 DIAGNOSIS — J449 Chronic obstructive pulmonary disease, unspecified: Secondary | ICD-10-CM | POA: Diagnosis not present

## 2017-03-21 DIAGNOSIS — R2689 Other abnormalities of gait and mobility: Secondary | ICD-10-CM | POA: Diagnosis not present

## 2017-03-21 DIAGNOSIS — Z8639 Personal history of other endocrine, nutritional and metabolic disease: Secondary | ICD-10-CM

## 2017-03-21 NOTE — Progress Notes (Signed)
Subjective:  Patient ID: [name], female    DOB: 17-Jun-1926  Age: 83 y.o. MRN: [id number]  CC: The primary encounter diagnosis was Closed fracture of superior ramus of left pubis with routine healing, subsequent encounter. Diagnoses of Personal history of estrogen deficiency and Osteopenia, unspecified location were also pertinent to this visit.  HPI [name] presents for hospital follow up . Presented to ED with pelvic pain following a fall that occurred out of town ..  ED visit occurred  on June 20th that resulted in admission for pain management.  She was treated and  discharged hone on June 22 with home health ed in a pelvic fracture  . cloed fracture of superior ramus of left pubis . Admitted for pain managment   Since she has been home she has been receiving home PT and has a daytime assistant. No falls .  No longer taking narcotics  Outpatient Medications Prior to Visit  Medication Sig Dispense Refill  . acetaminophen (TYLENOL) 325 MG tablet Take 2 tablets (650 mg total) by mouth every 6 (six) hours as needed for mild pain (or Fever >/= 101).    Marland Kitchen ALPRAZolam (XANAX) 0.25 MG tablet 1 to 2 tablets at bedtime as needed for insomnia 60 tablet 5  . amLODipine (NORVASC) 2.5 MG tablet TAKE 1 TABLET EVERY DAY 90 tablet 1  . aspirin 81 MG tablet Take 81 mg by mouth daily.      . clopidogrel (PLAVIX) 75 MG tablet TAKE 1 TABLET BY MOUTH ONE TIME DAILY 90 tablet 2  . docusate sodium (COLACE) 100 MG capsule Take 1 capsule (100 mg total) by mouth 2 (two) times daily as needed for mild constipation. 10 capsule 0  . esomeprazole (NEXIUM) 40 MG capsule TAKE 1 CAPSULE DAILY BEFORE BREAKFAST 90 capsule 1  . Ipratropium-Albuterol (COMBIVENT RESPIMAT) 20-100 MCG/ACT AERS respimat Inhale 1 puff into the lungs every 6 (six) hours. 1 Inhaler 10  . losartan (COZAAR) 100 MG tablet TAKE 1 TABLET BY MOUTH DAILY 90 tablet 2  . nitroGLYCERIN (NITROSTAT) 0.4 MG SL tablet Place 1 tablet (0.4 mg  total) under the tongue every 5 (five) minutes as needed for chest pain. 25 tablet 6  . polyethylene glycol (MIRALAX / GLYCOLAX) packet Take 17 g by mouth daily. 14 each 0  . simvastatin (ZOCOR) 40 MG tablet Take 1 tablet (40 mg total) by mouth at bedtime. 90 tablet 3  . HYDROcodone-acetaminophen (NORCO/VICODIN) 5-325 MG tablet Take 1 tablet by mouth every 4 (four) hours as needed for moderate pain. (Patient not taking: Reported on 03/21/2017) 20 tablet 0  . metaxalone (SKELAXIN) 400 MG tablet Take 1 tablet (400 mg total) by mouth 3 (three) times daily. (Patient not taking: Reported on 03/21/2017) 20 tablet 0   No facility-administered medications prior to visit.     Review of Systems;  Patient denies headache, fevers, malaise, unintentional weight loss, skin rash, eye pain, sinus congestion and sinus pain, sore throat, dysphagia,  hemoptysis , cough, dyspnea, wheezing, chest pain, palpitations, orthopnea, edema, abdominal pain, nausea, melena, diarrhea, constipation, flank pain, dysuria, hematuria, urinary  Frequency, nocturia, numbness, tingling, seizures,  Focal weakness, Loss of consciousness,  Tremor, insomnia, depression, anxiety, and suicidal ideation.      Objective:  BP 134/74 (BP Location: Left Arm, Patient Position: Sitting, Cuff Size: Normal)   Pulse 75   Temp 98.2 F (36.8 C) (Oral)   Resp 15   Ht 5\' 3"  (1.6 m)   Wt 128 lb  3.2 oz (58.2 kg)   SpO2 96%   BMI 22.71 kg/m   BP Readings from Last 3 Encounters:  03/21/17 134/74  03/10/17 (!) 145/62  01/30/17 (!) 118/56    Wt Readings from Last 3 Encounters:  03/21/17 128 lb 3.2 oz (58.2 kg)  03/08/17 128 lb (58.1 kg)  01/30/17 128 lb 12.8 oz (58.4 kg)    General appearance: alert, cooperative and appears stated age Ears: normal TM's and external ear canals both ears Throat: lips, mucosa, and tongue normal; teeth and gums normal Neck: no adenopathy, no carotid bruit, supple, symmetrical, trachea midline and thyroid not  enlarged, symmetric, no tenderness/mass/nodules Back: symmetric, no curvature. ROM normal. No CVA tenderness. Lungs: clear to auscultation bilaterally Heart: regular rate and rhythm, S1, S2 normal, no murmur, click, rub or gallop Abdomen: soft, non-tender; bowel sounds normal; no masses,  no organomegaly Pulses: 2+ and symmetric Skin: Skin color, texture, turgor normal. No rashes or lesions Lymph nodes: Cervical, supraclavicular, and axillary nodes normal.  Lab Results  Component Value Date   HGBA1C 5.8 01/30/2017   HGBA1C 5.8 09/26/2016    Lab Results  Component Value Date   CREATININE 1.11 (H) 03/10/2017   CREATININE 1.10 (H) 03/09/2017   CREATININE 1.40 (H) 03/08/2017    Lab Results  Component Value Date   WBC 5.2 03/10/2017   HGB 10.9 (L) 03/10/2017   HCT 32.5 (L) 03/10/2017   PLT 118 (L) 03/10/2017   GLUCOSE 105 (H) 03/10/2017   CHOL 109 12/08/2016   TRIG 122.0 12/08/2016   HDL 31.30 (L) 12/08/2016   LDLDIRECT 72.0 11/09/2015   LDLCALC 53 12/08/2016   ALT 11 12/08/2016   AST 12 12/08/2016   NA 140 03/10/2017   K 4.0 03/10/2017   CL 108 03/10/2017   CREATININE 1.11 (H) 03/10/2017   BUN 16 03/10/2017   CO2 27 03/10/2017   TSH 1.44 02/09/2015   INR 1.0 06/17/2014   HGBA1C 5.8 01/30/2017   MICROALBUR 2.5 (H) 12/26/2011    Dg Pelvis 1-2 Views  Result Date: 03/08/2017 CLINICAL DATA:  False morning with left hip pain, initial encounter EXAM: PELVIS - 1 VIEW COMPARISON:  None. FINDINGS: The pelvic ring is intact. The proximal femurs are within normal limits. Minimal irregularity is noted along the superior pubic ramus on the left which may represent an undisplaced fracture. No other fracture is seen. Degenerative changes of the lumbar spine are noted. No soft tissue abnormality is seen. IMPRESSION: Suspicious for undisplaced left superior pubic ramus fracture. Electronically Signed   By: Inez Catalina M.D.   On: 03/08/2017 15:16   Dg Shoulder Left  Result Date:  03/08/2017 CLINICAL DATA:  Recent fall with left shoulder pain, initial encounter EXAM: LEFT SHOULDER - 2+ VIEW COMPARISON:  None. FINDINGS: No acute fracture or dislocation is noted. Underlying bony thorax is within normal limits. No soft tissue abnormality is seen. IMPRESSION: No acute abnormality noted. Electronically Signed   By: Inez Catalina M.D.   On: 03/08/2017 15:16    Assessment & Plan:   Problem List Items Addressed This Visit    Osteopenia    With recent pelvic fracture.  Will repeat DEXA       Pelvic fracture ([place]) - Primary    Secondary to fall.  Pain has nearly resolved. No longer on narcotics..  Continue home PT. Repeat DEXA ordered.  Repeat pelvic films around July 20      Relevant Orders   DG Pelvis Comp Min 3V  Other Visit Diagnoses    Personal history of estrogen deficiency       Relevant Orders   DG Bone Density     A total of 25 minutes of face to face time was spent with patient more than half of which was spent in counselling about the above mentioned conditions  and coordination of care  I have discontinued Ms. Olivera's metaxalone and HYDROcodone-acetaminophen. I am also having her maintain her aspirin, esomeprazole, nitroGLYCERIN, amLODipine, simvastatin, Ipratropium-Albuterol, ALPRAZolam, acetaminophen, docusate sodium, polyethylene glycol, clopidogrel, and losartan.  No orders of the defined types were placed in this encounter.   Medications Discontinued During This Encounter  Medication Reason  . HYDROcodone-acetaminophen (NORCO/VICODIN) 5-325 MG tablet Patient has not taken in last 30 days  . metaxalone (SKELAXIN) 400 MG tablet Patient has not taken in last 30 days    Follow-up: No Follow-up on file.   [name], MD

## 2017-03-21 NOTE — Patient Instructions (Addendum)
Take 2,000 Ius of D3 every day  To help heal your bones  1800 mg calcium  Through  Diet mostly    Space your tylenol tablets  out , you can have 2000 mg daily,   or 3 of the 625 mg tablets.  Take one before you have PT   Repeat your pelvic x ray around July 20 here at our office

## 2017-03-22 NOTE — Assessment & Plan Note (Signed)
With recent pelvic fracture.  Will repeat DEXA

## 2017-03-22 NOTE — Assessment & Plan Note (Addendum)
Secondary to fall.  Pain has nearly resolved. No longer on narcotics..  Continue home PT. Repeat DEXA ordered.  Repeat pelvic films around July 20

## 2017-03-23 DIAGNOSIS — I2510 Atherosclerotic heart disease of native coronary artery without angina pectoris: Secondary | ICD-10-CM | POA: Diagnosis not present

## 2017-03-23 DIAGNOSIS — J449 Chronic obstructive pulmonary disease, unspecified: Secondary | ICD-10-CM | POA: Diagnosis not present

## 2017-03-23 DIAGNOSIS — S32512D Fracture of superior rim of left pubis, subsequent encounter for fracture with routine healing: Secondary | ICD-10-CM | POA: Diagnosis not present

## 2017-03-23 DIAGNOSIS — R2689 Other abnormalities of gait and mobility: Secondary | ICD-10-CM | POA: Diagnosis not present

## 2017-03-23 DIAGNOSIS — D696 Thrombocytopenia, unspecified: Secondary | ICD-10-CM | POA: Diagnosis not present

## 2017-03-23 DIAGNOSIS — M25512 Pain in left shoulder: Secondary | ICD-10-CM | POA: Diagnosis not present

## 2017-03-26 DIAGNOSIS — K5792 Diverticulitis of intestine, part unspecified, without perforation or abscess without bleeding: Secondary | ICD-10-CM | POA: Diagnosis not present

## 2017-03-27 ENCOUNTER — Telehealth: Payer: Self-pay | Admitting: *Deleted

## 2017-03-27 DIAGNOSIS — R1030 Lower abdominal pain, unspecified: Secondary | ICD-10-CM

## 2017-03-27 DIAGNOSIS — R2689 Other abnormalities of gait and mobility: Secondary | ICD-10-CM | POA: Diagnosis not present

## 2017-03-27 DIAGNOSIS — J449 Chronic obstructive pulmonary disease, unspecified: Secondary | ICD-10-CM | POA: Diagnosis not present

## 2017-03-27 DIAGNOSIS — D696 Thrombocytopenia, unspecified: Secondary | ICD-10-CM | POA: Diagnosis not present

## 2017-03-27 DIAGNOSIS — S32512D Fracture of superior rim of left pubis, subsequent encounter for fracture with routine healing: Secondary | ICD-10-CM | POA: Diagnosis not present

## 2017-03-27 DIAGNOSIS — M25512 Pain in left shoulder: Secondary | ICD-10-CM | POA: Diagnosis not present

## 2017-03-27 DIAGNOSIS — I2510 Atherosclerotic heart disease of native coronary artery without angina pectoris: Secondary | ICD-10-CM | POA: Diagnosis not present

## 2017-03-27 NOTE — Telephone Encounter (Signed)
Attempted to reach the patient, left a message. thanks

## 2017-03-27 NOTE — Telephone Encounter (Signed)
If she is moving her bowels,  Denies left lower quadrant pain, and fevers,  She doe snot have diverticulitis and does not need to take augmentin.  If the pain returns I would recommend getting a CT scan to determine if diverticulitis is present before taking antibitoics.

## 2017-03-27 NOTE — Telephone Encounter (Signed)
Called patient, Saturday evening was having diverticulitis flare, atleast that was what she believed it was.  The patient was not the same pain as in her pelvis. She said that the pain that night was a 10 out of ten, so in the am went to the walkin clinic.  The doctor there put her on Augmentin.  She has not filled the RX as she is concerned that with her Cdiff history that she shouldn't take it.   No pain today,  last night it took tylenol and there was no pain, no problems with urinating, bowel movements or intake, wants to know your opinion on if she needs the antibiotic.  Thanks  Return call to cell phone as she is going out today.

## 2017-03-27 NOTE — Telephone Encounter (Signed)
Pt requested a call from Triage. Pt was at the walk in on 07/08. Patient was given a medication for diverticulitis. The medication prescribed was Augmentin . Pt wanted to make sure this would be okay to take due to History of Cdiff.  Pt contact [phone] or [phone]

## 2017-03-28 NOTE — Telephone Encounter (Signed)
Spoke with the patient and she is moving her bowels, had some diarrhea this am.  Denies Fevers, but Pain has come back, not as severe as this past weekend, more intermittent.  I discussed the option of a CT to look at the area better and she is in agreement if you think it will help.  Please advise. thanks

## 2017-03-28 NOTE — Telephone Encounter (Signed)
STAT CT ORDERED,  PLEASE LET MELISSA KNOW  THAT THE TIME FRAME IS WITHIN THE NEXT 24 HOURS PREFERABLE TODAY

## 2017-03-28 NOTE — Telephone Encounter (Signed)
Noted, and notified Referral coordinators, thanks

## 2017-03-29 ENCOUNTER — Ambulatory Visit
Admission: RE | Admit: 2017-03-29 | Discharge: 2017-03-29 | Disposition: A | Discharge status: Home / Self Care | Payer: Medicare Other | Source: Ambulatory Visit | Attending: Internal Medicine | Admitting: Internal Medicine

## 2017-03-29 DIAGNOSIS — S32502D Unspecified fracture of left pubis, subsequent encounter for fracture with routine healing: Secondary | ICD-10-CM | POA: Insufficient documentation

## 2017-03-29 DIAGNOSIS — K5732 Diverticulitis of large intestine without perforation or abscess without bleeding: Secondary | ICD-10-CM | POA: Insufficient documentation

## 2017-03-29 DIAGNOSIS — X58XXXD Exposure to other specified factors, subsequent encounter: Secondary | ICD-10-CM | POA: Insufficient documentation

## 2017-03-29 DIAGNOSIS — M47817 Spondylosis without myelopathy or radiculopathy, lumbosacral region: Secondary | ICD-10-CM | POA: Insufficient documentation

## 2017-03-29 DIAGNOSIS — K449 Diaphragmatic hernia without obstruction or gangrene: Secondary | ICD-10-CM | POA: Diagnosis not present

## 2017-03-29 DIAGNOSIS — I2510 Atherosclerotic heart disease of native coronary artery without angina pectoris: Secondary | ICD-10-CM | POA: Diagnosis not present

## 2017-03-29 DIAGNOSIS — R1030 Lower abdominal pain, unspecified: Secondary | ICD-10-CM | POA: Insufficient documentation

## 2017-03-29 DIAGNOSIS — I700 Atherosclerosis of aorta: Secondary | ICD-10-CM | POA: Insufficient documentation

## 2017-03-29 DIAGNOSIS — D0471 Carcinoma in situ of skin of right lower limb, including hip: Secondary | ICD-10-CM | POA: Diagnosis not present

## 2017-03-29 MED ORDER — IOPAMIDOL (ISOVUE-300) INJECTION 61%
100.0000 mL | Freq: Once | INTRAVENOUS | Status: AC | PRN
  Administered 2017-03-29: 100 mL via INTRAVENOUS

## 2017-03-30 DIAGNOSIS — D696 Thrombocytopenia, unspecified: Secondary | ICD-10-CM | POA: Diagnosis not present

## 2017-03-30 DIAGNOSIS — S32512D Fracture of superior rim of left pubis, subsequent encounter for fracture with routine healing: Secondary | ICD-10-CM | POA: Diagnosis not present

## 2017-03-30 DIAGNOSIS — M25512 Pain in left shoulder: Secondary | ICD-10-CM | POA: Diagnosis not present

## 2017-03-30 DIAGNOSIS — I2510 Atherosclerotic heart disease of native coronary artery without angina pectoris: Secondary | ICD-10-CM | POA: Diagnosis not present

## 2017-03-30 DIAGNOSIS — J449 Chronic obstructive pulmonary disease, unspecified: Secondary | ICD-10-CM | POA: Diagnosis not present

## 2017-03-30 DIAGNOSIS — R2689 Other abnormalities of gait and mobility: Secondary | ICD-10-CM | POA: Diagnosis not present

## 2017-03-31 DIAGNOSIS — I2510 Atherosclerotic heart disease of native coronary artery without angina pectoris: Secondary | ICD-10-CM | POA: Diagnosis not present

## 2017-03-31 DIAGNOSIS — J449 Chronic obstructive pulmonary disease, unspecified: Secondary | ICD-10-CM | POA: Diagnosis not present

## 2017-03-31 DIAGNOSIS — D696 Thrombocytopenia, unspecified: Secondary | ICD-10-CM | POA: Diagnosis not present

## 2017-03-31 DIAGNOSIS — S32512D Fracture of superior rim of left pubis, subsequent encounter for fracture with routine healing: Secondary | ICD-10-CM | POA: Diagnosis not present

## 2017-03-31 DIAGNOSIS — R2689 Other abnormalities of gait and mobility: Secondary | ICD-10-CM | POA: Diagnosis not present

## 2017-03-31 DIAGNOSIS — M25512 Pain in left shoulder: Secondary | ICD-10-CM | POA: Diagnosis not present

## 2017-04-04 DIAGNOSIS — R2689 Other abnormalities of gait and mobility: Secondary | ICD-10-CM | POA: Diagnosis not present

## 2017-04-04 DIAGNOSIS — I2510 Atherosclerotic heart disease of native coronary artery without angina pectoris: Secondary | ICD-10-CM | POA: Diagnosis not present

## 2017-04-04 DIAGNOSIS — J449 Chronic obstructive pulmonary disease, unspecified: Secondary | ICD-10-CM | POA: Diagnosis not present

## 2017-04-04 DIAGNOSIS — S32512D Fracture of superior rim of left pubis, subsequent encounter for fracture with routine healing: Secondary | ICD-10-CM | POA: Diagnosis not present

## 2017-04-04 DIAGNOSIS — M25512 Pain in left shoulder: Secondary | ICD-10-CM | POA: Diagnosis not present

## 2017-04-04 DIAGNOSIS — D696 Thrombocytopenia, unspecified: Secondary | ICD-10-CM | POA: Diagnosis not present

## 2017-04-06 DIAGNOSIS — I2510 Atherosclerotic heart disease of native coronary artery without angina pectoris: Secondary | ICD-10-CM | POA: Diagnosis not present

## 2017-04-06 DIAGNOSIS — J449 Chronic obstructive pulmonary disease, unspecified: Secondary | ICD-10-CM | POA: Diagnosis not present

## 2017-04-06 DIAGNOSIS — D696 Thrombocytopenia, unspecified: Secondary | ICD-10-CM | POA: Diagnosis not present

## 2017-04-06 DIAGNOSIS — S32512D Fracture of superior rim of left pubis, subsequent encounter for fracture with routine healing: Secondary | ICD-10-CM | POA: Diagnosis not present

## 2017-04-06 DIAGNOSIS — M25512 Pain in left shoulder: Secondary | ICD-10-CM | POA: Diagnosis not present

## 2017-04-06 DIAGNOSIS — R2689 Other abnormalities of gait and mobility: Secondary | ICD-10-CM | POA: Diagnosis not present

## 2017-04-07 ENCOUNTER — Ambulatory Visit (INDEPENDENT_AMBULATORY_CARE_PROVIDER_SITE_OTHER): Payer: Medicare Other

## 2017-04-07 DIAGNOSIS — S32512D Fracture of superior rim of left pubis, subsequent encounter for fracture with routine healing: Secondary | ICD-10-CM | POA: Diagnosis not present

## 2017-04-07 DIAGNOSIS — S32502A Unspecified fracture of left pubis, initial encounter for closed fracture: Secondary | ICD-10-CM | POA: Diagnosis not present

## 2017-04-09 ENCOUNTER — Encounter: Payer: Self-pay | Admitting: Internal Medicine

## 2017-04-10 DIAGNOSIS — R2689 Other abnormalities of gait and mobility: Secondary | ICD-10-CM | POA: Diagnosis not present

## 2017-04-10 DIAGNOSIS — D696 Thrombocytopenia, unspecified: Secondary | ICD-10-CM | POA: Diagnosis not present

## 2017-04-10 DIAGNOSIS — M25512 Pain in left shoulder: Secondary | ICD-10-CM | POA: Diagnosis not present

## 2017-04-10 DIAGNOSIS — I2510 Atherosclerotic heart disease of native coronary artery without angina pectoris: Secondary | ICD-10-CM | POA: Diagnosis not present

## 2017-04-10 DIAGNOSIS — J449 Chronic obstructive pulmonary disease, unspecified: Secondary | ICD-10-CM | POA: Diagnosis not present

## 2017-04-10 DIAGNOSIS — S32512D Fracture of superior rim of left pubis, subsequent encounter for fracture with routine healing: Secondary | ICD-10-CM | POA: Diagnosis not present

## 2017-04-11 ENCOUNTER — Telehealth: Payer: Self-pay | Admitting: Internal Medicine

## 2017-04-11 ENCOUNTER — Telehealth: Payer: Self-pay | Admitting: *Deleted

## 2017-04-11 NOTE — Telephone Encounter (Signed)
Pt requested a call , she questioned if she should be using her walker and when could she drive again. Pt had a fall 5 weeks ago.  Pt contact [phone]

## 2017-04-11 NOTE — Telephone Encounter (Signed)
Please advise 

## 2017-04-11 NOTE — Telephone Encounter (Signed)
Patient stated that she has completed PT and that her left foot was the one that hurt so bad but that she was going to decline driving for one more week, and would continue to use the walker until the pain was gone in her left foot. Patient wanted to thank PCP very much.

## 2017-04-11 NOTE — Telephone Encounter (Signed)
If she is still receiving home PT<  She should not drive yet.  If not, and she has no pain and can push down with her right foot without pain,  She can drive.  I would use the walker for as long as physical therapy says she needs to [place] it.

## 2017-05-17 ENCOUNTER — Ambulatory Visit (INDEPENDENT_AMBULATORY_CARE_PROVIDER_SITE_OTHER): Payer: Medicare Other

## 2017-05-17 ENCOUNTER — Telehealth: Payer: Self-pay | Admitting: Internal Medicine

## 2017-05-17 DIAGNOSIS — R29898 Other symptoms and signs involving the musculoskeletal system: Secondary | ICD-10-CM

## 2017-05-17 DIAGNOSIS — S32501A Unspecified fracture of right pubis, initial encounter for closed fracture: Secondary | ICD-10-CM | POA: Diagnosis not present

## 2017-05-17 NOTE — Telephone Encounter (Signed)
Pt called and stated that for about 1 week her left leg keeps giving out. She states that her pelvis fx was on the left side and wants to make sure that she didn't reinjured it. Please advise, thank you!  Call pt @ [phone]

## 2017-05-17 NOTE — Telephone Encounter (Signed)
Patient denies swelling. Only in pain when she is changing position or walking. She describes pain like a pulled muscle. She was just wanting to be sure the fx has healed properly and not re-injured. She is coming in for x-ray today.

## 2017-05-17 NOTE — Telephone Encounter (Signed)
Pelvic x ray ordered  Come to offivce  If she is having pain and swelling needs to be seen to rule out DVT

## 2017-05-17 NOTE — Telephone Encounter (Signed)
Patient called stating left leg has been giving out for about a week. Pain in the back of the upper leg when she going from sit to stand and stepping down. Fractured pelvis 03/08/2017. Denies recent falls. Patient wanted to know if you thought it would be beneficial to repeat x-ray to be sure she has not re-injured her pelvis. Please advise.

## 2017-05-17 NOTE — Telephone Encounter (Signed)
Tried to return call. Line was busy.

## 2017-05-19 ENCOUNTER — Telehealth: Payer: Self-pay | Admitting: *Deleted

## 2017-05-19 DIAGNOSIS — D485 Neoplasm of uncertain behavior of skin: Secondary | ICD-10-CM | POA: Diagnosis not present

## 2017-05-19 DIAGNOSIS — C44722 Squamous cell carcinoma of skin of right lower limb, including hip: Secondary | ICD-10-CM | POA: Diagnosis not present

## 2017-05-19 DIAGNOSIS — D0471 Carcinoma in situ of skin of right lower limb, including hip: Secondary | ICD-10-CM | POA: Diagnosis not present

## 2017-05-19 NOTE — Telephone Encounter (Signed)
Pt requested Xray results  Pt contact [phone]

## 2017-05-19 NOTE — Telephone Encounter (Signed)
Non urgent. Can wait till your return.   Spoke with pt and informed her that you would be out of the office until next Wednesday and that we would give her a call then with the results. Pt was okay with that.

## 2017-05-24 NOTE — Telephone Encounter (Signed)
No new fractures  Bones are healing  As expected with good alignment.

## 2017-05-25 NOTE — Telephone Encounter (Signed)
Patient notified and voiced understanding.

## 2017-05-29 ENCOUNTER — Encounter: Payer: Self-pay | Admitting: Podiatry

## 2017-05-29 ENCOUNTER — Ambulatory Visit (INDEPENDENT_AMBULATORY_CARE_PROVIDER_SITE_OTHER): Payer: Medicare Other | Admitting: Podiatry

## 2017-05-29 ENCOUNTER — Ambulatory Visit (INDEPENDENT_AMBULATORY_CARE_PROVIDER_SITE_OTHER): Payer: Medicare Other

## 2017-05-29 DIAGNOSIS — I6523 Occlusion and stenosis of bilateral carotid arteries: Secondary | ICD-10-CM

## 2017-05-29 DIAGNOSIS — M2042 Other hammer toe(s) (acquired), left foot: Secondary | ICD-10-CM

## 2017-05-29 DIAGNOSIS — Q828 Other specified congenital malformations of skin: Secondary | ICD-10-CM

## 2017-05-29 NOTE — Progress Notes (Signed)
   Subjective:    Patient ID: [name], female    DOB: 11-15-1925, 83 y.o.   MRN: [id number]  HPI: She presents today with chief complaint of pain to the fifth digit of the left foot. She states that a small orbital toes and I tried wearing pads. She states has been present for several months as long as I wear open toed shoes and doing much better. She denies any open lesions or wounds.    Review of Systems  All other systems reviewed and are negative.      Objective:   Physical Exam: Vital signs are stable alert and oriented 3. Pulses are palpable. Neurologic sensorium is intact. Deep tendon reflexes are intact. Muscle strength is normal symmetrical bilateral. Orthopedic evaluation demonstrates all joints distal to the ankle for range of motion without crepitation. Cutaneous evaluation demonstrates a well-hydrated cutis erythema edema cellulitis drainage or odor. She has adductovarus rotated hammertoe deformity of the fifth digit of the left foot is as resulted in juxtaposition of the fourth and fifth toes in a way that results and a reactive hyperkeratosis to the medial aspect of the fifth digit left foot by the DIPJ. This is moderately tender there does not appear to be any skin breakdown see no signs of infection.        Assessment & Plan:  Hammertoe deformity with corn fifth digit left foot.  Plan: Debrided reactive hyperkeratotic lesion today discussed this with the patient thoroughly. Dispensed 2 types of padding. We'll follow up with her on an as-needed basis.

## 2017-06-05 ENCOUNTER — Ambulatory Visit: Payer: Medicare Other

## 2017-06-16 ENCOUNTER — Other Ambulatory Visit: Payer: Self-pay | Admitting: Internal Medicine

## 2017-06-19 DIAGNOSIS — Z23 Encounter for immunization: Secondary | ICD-10-CM | POA: Diagnosis not present

## 2017-06-23 ENCOUNTER — Other Ambulatory Visit: Payer: Self-pay | Admitting: Cardiovascular Disease

## 2017-07-04 DIAGNOSIS — M7542 Impingement syndrome of left shoulder: Secondary | ICD-10-CM | POA: Diagnosis not present

## 2017-07-04 DIAGNOSIS — S32509A Unspecified fracture of unspecified pubis, initial encounter for closed fracture: Secondary | ICD-10-CM | POA: Insufficient documentation

## 2017-07-04 DIAGNOSIS — M542 Cervicalgia: Secondary | ICD-10-CM | POA: Insufficient documentation

## 2017-07-04 DIAGNOSIS — S32502A Unspecified fracture of left pubis, initial encounter for closed fracture: Secondary | ICD-10-CM | POA: Diagnosis not present

## 2017-07-04 HISTORY — DX: Unspecified fracture of unspecified pubis, initial encounter for closed fracture: S32.509A

## 2017-07-05 ENCOUNTER — Ambulatory Visit
Admission: RE | Admit: 2017-07-05 | Discharge: 2017-07-05 | Disposition: A | Discharge status: Home / Self Care | Payer: Medicare Other | Source: Ambulatory Visit | Attending: Internal Medicine | Admitting: Internal Medicine

## 2017-07-05 DIAGNOSIS — M810 Age-related osteoporosis without current pathological fracture: Secondary | ICD-10-CM | POA: Insufficient documentation

## 2017-07-05 DIAGNOSIS — Z8639 Personal history of other endocrine, nutritional and metabolic disease: Secondary | ICD-10-CM

## 2017-07-05 DIAGNOSIS — C44722 Squamous cell carcinoma of skin of right lower limb, including hip: Secondary | ICD-10-CM | POA: Diagnosis not present

## 2017-07-05 DIAGNOSIS — D0471 Carcinoma in situ of skin of right lower limb, including hip: Secondary | ICD-10-CM | POA: Diagnosis not present

## 2017-07-08 ENCOUNTER — Encounter: Payer: Self-pay | Admitting: Internal Medicine

## 2017-07-12 DIAGNOSIS — M75102 Unspecified rotator cuff tear or rupture of left shoulder, not specified as traumatic: Secondary | ICD-10-CM | POA: Diagnosis not present

## 2017-07-12 DIAGNOSIS — R29898 Other symptoms and signs involving the musculoskeletal system: Secondary | ICD-10-CM | POA: Diagnosis not present

## 2017-07-12 DIAGNOSIS — M25512 Pain in left shoulder: Secondary | ICD-10-CM | POA: Diagnosis not present

## 2017-07-16 NOTE — Progress Notes (Signed)
Cardiology Office Note  Date:  07/17/2017   ID:  [name], DOB 12/07/1925, MRN [id number]  PCP:  [name], MD   Chief Complaint  Patient presents with  . other    6 month follow up. Meds reviewed by the pt. verbally. Pt. c/o shortness of breath.     HPI:  [name] is a pleasant 83 year old woman with a history of  CAD, bypass surgery in 1999,Stress test October 2017 with no ischemia Smoking for 40 years who quit in 1985,  COPD,  chronic renal insufficiency,  hypertension,  hyperlipidemia,  40-50% bilateral carotid arterial disease  EF >60% in 05/2015  C. difficile in January 2015. who presents for routine followup of her coronary artery disease  Fall June 2018 after slipping on wet floor Suffered a pelvic fracture, left shoulder pain, June 2018 Had a difficult summer secondary to chronic pelvic pain, now recovered Still has chronic left shoulder pain, doing physical therapy, had cortisone shot with orthopedics  Reports her breathing is stable No regular exercise program but is active  Avid golfer Lab work reviewed with her, LDL 72 HBA1C 5.8  EKG personally reviewed by myself on todays visit Shows sinus bradycardia rate 59 7 bpm T wave abnormality V1 through V4, 1 and aVL  Of the past medical history reviewed Hospital admission 12/21/2016 for COPD exacerbation Syncope, dehydrated She received steroids,duo nebs, ABX  Simvastatin dose was decreased in the hospital for unclear reasons, down to 20 She is requesting to go back on the 40 mg dose  On previous office visits, reported having left-sided chest pain and shoulder pain Declined stress testing or catheterization  at that time.  On last clinic visit had chest pain, took aspirin and nitroglycerin, after several hours symptoms resolved.   She has not had any intervention since her bypass surgery. No cardiac catheterizations, no stent placement.  C. difficile in January 2015. She was in the  hospital for several days.  Last stress test was October 2011 and September 2015  Carotid ultrasound showing 40-59% disease   PMH:   has a past medical history of C. difficile colitis; CAD (coronary artery disease); Carotid artery disease ([place]); Diffuse cystic mastopathy; Diverticulitis; Dizziness; Gait abnormality; GERD (gastroesophageal reflux disease); History of migraines; CABG; Hyperlipidemia; Hypertension; Indigestion; Kidney stones; Rheumatic fever; Sinus bradycardia; SOB (shortness of breath); and Syncopal episodes.  PSH:    Past Surgical History:  Procedure Laterality Date  . APPENDECTOMY  1950  . BREAST BIOPSY Right 1994  . BREAST CYST ASPIRATION     multiple BIL  . CATARACT EXTRACTION Right 2009  . CATARACT EXTRACTION Left 2011  . COLONOSCOPY  [id number]   Dr. Jamal Collin  . CORONARY ARTERY BYPASS GRAFT  1999  . esophagus stretched    . TONSILLECTOMY  1939  . TOTAL ABDOMINAL HYSTERECTOMY  1968   due to metrorrhagia    Current Outpatient Prescriptions  Medication Sig Dispense Refill  . acetaminophen (TYLENOL) 325 MG tablet Take 2 tablets (650 mg total) by mouth every 6 (six) hours as needed for mild pain (or Fever >/= 101).    Marland Kitchen ALPRAZolam (XANAX) 0.25 MG tablet 1 to 2 tablets at bedtime as needed for insomnia 60 tablet 5  . amLODipine (NORVASC) 2.5 MG tablet TAKE 1 TABLET BY MOUTH DAILY 90 tablet 0  . aspirin 81 MG tablet Take 81 mg by mouth daily.      . clopidogrel (PLAVIX) 75 MG tablet TAKE 1 TABLET BY MOUTH ONE TIME  DAILY 90 tablet 2  . docusate sodium (COLACE) 100 MG capsule Take 1 capsule (100 mg total) by mouth 2 (two) times daily as needed for mild constipation. 10 capsule 0  . esomeprazole (NEXIUM) 40 MG capsule TAKE 1 CAPSULE DAILY BEFORE BREAKFAST 90 capsule 1  . Ipratropium-Albuterol (COMBIVENT RESPIMAT) 20-100 MCG/ACT AERS respimat Inhale 1 puff into the lungs every 6 (six) hours. 1 Inhaler 10  . losartan (COZAAR) 100 MG tablet TAKE 1 TABLET BY MOUTH DAILY 90  tablet 2  . nitroGLYCERIN (NITROSTAT) 0.4 MG SL tablet DISSOLVE 1 TABLET UNDER TONGUE EVERY FIVE MINUTES UP TO 3 DOSES AS NEEDED 25 tablet 0  . polyethylene glycol (MIRALAX / GLYCOLAX) packet Take 17 g by mouth daily. 14 each 0  . SHINGRIX injection     . simvastatin (ZOCOR) 40 MG tablet Take 1 tablet (40 mg total) by mouth at bedtime. 90 tablet 3   No current facility-administered medications for this visit.      Allergies:   Codeine   Social History:  The patient  reports that she quit smoking about 33 years ago. Her smoking use included Cigarettes. She has a 20.00 pack-year smoking history. She has never used smokeless tobacco. She reports that she drinks alcohol. She reports that she does not use drugs.   Family History:   family history includes Heart disease in her father and mother.    Review of Systems: Review of Systems  Constitutional: Negative.   Respiratory: Negative.   Cardiovascular: Negative.   Gastrointestinal: Negative.   Musculoskeletal: Positive for joint pain.  Psychiatric/Behavioral: Negative.   All other systems reviewed and are negative.    PHYSICAL EXAM: VS:  BP 140/60 (BP Location: Left Arm, Patient Position: Sitting, Cuff Size: Normal)   Pulse (!) 57   Ht 5' 3.5" (1.613 m)   Wt 128 lb 8 oz (58.3 kg)   BMI 22.41 kg/m  , BMI Body mass index is 22.41 kg/m. No change in physical exam GEN: Well nourished, well developed, in no acute distress  HEENT: normal  Neck: no JVD, carotid bruits, or masses Cardiac: RRR; no murmurs, rubs, or gallops,no edema  Respiratory:  clear to auscultation bilaterally, normal work of breathing GI: soft, nontender, nondistended, + BS MS: no deformity or atrophy  Skin: warm and dry, no rash Neuro:  Strength and sensation are intact Psych: euthymic mood, full affect    Recent Labs: 12/08/2016: ALT 11 12/21/2016: Magnesium 1.9 03/10/2017: BUN 16; Creatinine, Ser 1.11; Hemoglobin 10.9; Platelets 118; Potassium 4.0; Sodium  140    Lipid Panel Lab Results  Component Value Date   CHOL 109 12/08/2016   HDL 31.30 (L) 12/08/2016   LDLCALC 53 12/08/2016   TRIG 122.0 12/08/2016      Wt Readings from Last 3 Encounters:  07/17/17 128 lb 8 oz (58.3 kg)  03/21/17 128 lb 3.2 oz (58.2 kg)  03/08/17 128 lb (58.1 kg)       ASSESSMENT AND PLAN:   Essential hypertension -  Blood pressure is well controlled on today's visit. No changes made to the medications.  Coronary artery disease involving native coronary artery of native heart without angina pectoris -  Previous stress test October 2017 showing no ischemia No pain on exertion, no significant shortness of breath on exertion  History of bypass surgery Denies any anginal symptoms No anginal symptoms, discussed with her in detail we will watch for shortness of breath or chest tightness on exertion  Bronchitis Recovered from episode of  bronchitis April 2018, no further episodes  Carotid stenosis Less than 39% bilaterally, significant plaque noted Repeat in 1 year  Hyperlipidemia Cholesterol is at goal on the current lipid regimen. No changes to the medications were made.    Total encounter time more than 25 minutes  Greater than 50% was spent in counseling and coordination of care with the patient   Disposition:   F/U  6 months   No orders of the defined types were placed in this encounter.    Signed, Esmond Plants, M.D., Ph.D. 07/17/2017  [place], [place]

## 2017-07-17 ENCOUNTER — Encounter: Payer: Self-pay | Admitting: Cardiovascular Disease

## 2017-07-17 ENCOUNTER — Ambulatory Visit (INDEPENDENT_AMBULATORY_CARE_PROVIDER_SITE_OTHER): Payer: Medicare Other | Admitting: Cardiovascular Disease

## 2017-07-17 VITALS — BP 140/60 | HR 57 | Ht 63.5 in | Wt 128.5 lb

## 2017-07-17 DIAGNOSIS — W19XXXS Unspecified fall, sequela: Secondary | ICD-10-CM | POA: Diagnosis not present

## 2017-07-17 DIAGNOSIS — I10 Essential (primary) hypertension: Secondary | ICD-10-CM

## 2017-07-17 DIAGNOSIS — I25118 Atherosclerotic heart disease of native coronary artery with other forms of angina pectoris: Secondary | ICD-10-CM | POA: Diagnosis not present

## 2017-07-17 DIAGNOSIS — E782 Mixed hyperlipidemia: Secondary | ICD-10-CM | POA: Diagnosis not present

## 2017-07-17 DIAGNOSIS — I209 Angina pectoris, unspecified: Secondary | ICD-10-CM

## 2017-07-17 DIAGNOSIS — I700 Atherosclerosis of aorta: Secondary | ICD-10-CM | POA: Diagnosis not present

## 2017-07-17 DIAGNOSIS — I6523 Occlusion and stenosis of bilateral carotid arteries: Secondary | ICD-10-CM | POA: Diagnosis not present

## 2017-07-17 NOTE — Patient Instructions (Signed)

## 2017-07-19 DIAGNOSIS — M25512 Pain in left shoulder: Secondary | ICD-10-CM | POA: Diagnosis not present

## 2017-07-19 DIAGNOSIS — R29898 Other symptoms and signs involving the musculoskeletal system: Secondary | ICD-10-CM | POA: Diagnosis not present

## 2017-07-19 DIAGNOSIS — M75102 Unspecified rotator cuff tear or rupture of left shoulder, not specified as traumatic: Secondary | ICD-10-CM | POA: Diagnosis not present

## 2017-08-02 DIAGNOSIS — X32XXXA Exposure to sunlight, initial encounter: Secondary | ICD-10-CM | POA: Diagnosis not present

## 2017-08-02 DIAGNOSIS — L821 Other seborrheic keratosis: Secondary | ICD-10-CM | POA: Diagnosis not present

## 2017-08-02 DIAGNOSIS — Z85828 Personal history of other malignant neoplasm of skin: Secondary | ICD-10-CM | POA: Diagnosis not present

## 2017-08-02 DIAGNOSIS — L570 Actinic keratosis: Secondary | ICD-10-CM | POA: Diagnosis not present

## 2017-08-02 DIAGNOSIS — Z08 Encounter for follow-up examination after completed treatment for malignant neoplasm: Secondary | ICD-10-CM | POA: Diagnosis not present

## 2017-08-04 ENCOUNTER — Other Ambulatory Visit: Payer: Self-pay | Admitting: Internal Medicine

## 2017-08-04 NOTE — Telephone Encounter (Signed)
Refilled: 01/30/2017 Last OV: 03/21/2017 Next OV: not scheduled

## 2017-08-04 NOTE — Telephone Encounter (Signed)
Refill for 30 days only.  OFFICE VISIT NEEDED prior to any more refills 

## 2017-08-04 NOTE — Telephone Encounter (Signed)
Printed, signed and faxed.  

## 2017-09-07 ENCOUNTER — Inpatient Hospital Stay
Admission: EM | Admit: 2017-09-07 | Discharge: 2017-09-08 | DRG: 194 | Disposition: A | Discharge status: Home / Self Care | Payer: Medicare Other | Attending: Internal Medicine | Admitting: Internal Medicine

## 2017-09-07 ENCOUNTER — Encounter: Payer: Self-pay | Admitting: Emergency Medicine

## 2017-09-07 ENCOUNTER — Other Ambulatory Visit: Payer: Self-pay

## 2017-09-07 ENCOUNTER — Emergency Department: Payer: Medicare Other

## 2017-09-07 DIAGNOSIS — J440 Chronic obstructive pulmonary disease with acute lower respiratory infection: Secondary | ICD-10-CM | POA: Diagnosis present

## 2017-09-07 DIAGNOSIS — R110 Nausea: Secondary | ICD-10-CM | POA: Diagnosis not present

## 2017-09-07 DIAGNOSIS — K219 Gastro-esophageal reflux disease without esophagitis: Secondary | ICD-10-CM | POA: Diagnosis present

## 2017-09-07 DIAGNOSIS — E785 Hyperlipidemia, unspecified: Secondary | ICD-10-CM | POA: Diagnosis present

## 2017-09-07 DIAGNOSIS — R252 Cramp and spasm: Secondary | ICD-10-CM

## 2017-09-07 DIAGNOSIS — E860 Dehydration: Secondary | ICD-10-CM | POA: Diagnosis present

## 2017-09-07 DIAGNOSIS — Z7902 Long term (current) use of antithrombotics/antiplatelets: Secondary | ICD-10-CM

## 2017-09-07 DIAGNOSIS — N179 Acute kidney failure, unspecified: Secondary | ICD-10-CM | POA: Diagnosis not present

## 2017-09-07 DIAGNOSIS — Z8249 Family history of ischemic heart disease and other diseases of the circulatory system: Secondary | ICD-10-CM

## 2017-09-07 DIAGNOSIS — Z951 Presence of aortocoronary bypass graft: Secondary | ICD-10-CM

## 2017-09-07 DIAGNOSIS — Z90710 Acquired absence of both cervix and uterus: Secondary | ICD-10-CM

## 2017-09-07 DIAGNOSIS — R0902 Hypoxemia: Secondary | ICD-10-CM | POA: Diagnosis present

## 2017-09-07 DIAGNOSIS — I10 Essential (primary) hypertension: Secondary | ICD-10-CM | POA: Diagnosis present

## 2017-09-07 DIAGNOSIS — I2510 Atherosclerotic heart disease of native coronary artery without angina pectoris: Secondary | ICD-10-CM | POA: Diagnosis not present

## 2017-09-07 DIAGNOSIS — M6281 Muscle weakness (generalized): Secondary | ICD-10-CM | POA: Diagnosis not present

## 2017-09-07 DIAGNOSIS — J189 Pneumonia, unspecified organism: Secondary | ICD-10-CM | POA: Diagnosis present

## 2017-09-07 DIAGNOSIS — Z87891 Personal history of nicotine dependence: Secondary | ICD-10-CM | POA: Diagnosis not present

## 2017-09-07 DIAGNOSIS — Z7982 Long term (current) use of aspirin: Secondary | ICD-10-CM

## 2017-09-07 DIAGNOSIS — G4762 Sleep related leg cramps: Secondary | ICD-10-CM | POA: Diagnosis not present

## 2017-09-07 DIAGNOSIS — R0600 Dyspnea, unspecified: Secondary | ICD-10-CM | POA: Diagnosis not present

## 2017-09-07 HISTORY — DX: Chronic obstructive pulmonary disease, unspecified: J44.9

## 2017-09-07 LAB — URINALYSIS, COMPLETE (UACMP) WITH MICROSCOPIC
BILIRUBIN URINE: NEGATIVE
Bacteria, UA: NONE SEEN
Glucose, UA: NEGATIVE mg/dL
Hgb urine dipstick: NEGATIVE
KETONES UR: NEGATIVE mg/dL
LEUKOCYTES UA: NEGATIVE
Nitrite: NEGATIVE
PH: 5 (ref 5.0–8.0)
PROTEIN: 100 mg/dL — AB
Specific Gravity, Urine: 1.025 (ref 1.005–1.030)

## 2017-09-07 LAB — COMPREHENSIVE METABOLIC PANEL
ALT: 16 U/L (ref 14–54)
AST: 28 U/L (ref 15–41)
Albumin: 3.9 g/dL (ref 3.5–5.0)
Alkaline Phosphatase: 66 U/L (ref 38–126)
Anion gap: 8 (ref 5–15)
BUN: 24 mg/dL — AB (ref 6–20)
CHLORIDE: 105 mmol/L (ref 101–111)
CO2: 25 mmol/L (ref 22–32)
CREATININE: 1.43 mg/dL — AB (ref 0.44–1.00)
Calcium: 8.6 mg/dL — ABNORMAL LOW (ref 8.9–10.3)
GFR calc non Af Amer: 31 mL/min — ABNORMAL LOW (ref >60)
GFR, EST AFRICAN AMERICAN: 36 mL/min — AB (ref >60)
Glucose, Bld: 141 mg/dL — ABNORMAL HIGH (ref 65–99)
POTASSIUM: 4.2 mmol/L (ref 3.5–5.1)
SODIUM: 138 mmol/L (ref 135–145)
Total Bilirubin: 0.9 mg/dL (ref 0.3–1.2)
Total Protein: 6.6 g/dL (ref 6.5–8.1)

## 2017-09-07 LAB — CBC
HEMATOCRIT: 42.1 % (ref 35.0–47.0)
Hemoglobin: 14.2 g/dL (ref 12.0–16.0)
MCH: 33.4 pg (ref 26.0–34.0)
MCHC: 33.6 g/dL (ref 32.0–36.0)
MCV: 99.2 fL (ref 80.0–100.0)
PLATELETS: 175 10*3/uL (ref 150–440)
RBC: 4.24 MIL/uL (ref 3.80–5.20)
RDW: 13.2 % (ref 11.5–14.5)
WBC: 7.8 10*3/uL (ref 3.6–11.0)

## 2017-09-07 LAB — LIPASE, BLOOD: LIPASE: 36 U/L (ref 11–51)

## 2017-09-07 MED ORDER — ACETAMINOPHEN 500 MG PO TABS
1000.0000 mg | ORAL_TABLET | Freq: Once | ORAL | Status: AC
  Administered 2017-09-07: 1000 mg via ORAL

## 2017-09-07 MED ORDER — AZITHROMYCIN 500 MG IV SOLR
500.0000 mg | Freq: Once | INTRAVENOUS | Status: AC
  Administered 2017-09-07: 500 mg via INTRAVENOUS
  Filled 2017-09-07: qty 500

## 2017-09-07 MED ORDER — SODIUM CHLORIDE 0.9 % IV BOLUS (SEPSIS)
1000.0000 mL | Freq: Once | INTRAVENOUS | Status: AC
  Administered 2017-09-07: 1000 mL via INTRAVENOUS

## 2017-09-07 MED ORDER — ONDANSETRON HCL 4 MG/2ML IJ SOLN
4.0000 mg | Freq: Once | INTRAMUSCULAR | Status: AC | PRN
  Administered 2017-09-07: 4 mg via INTRAVENOUS
  Filled 2017-09-07: qty 2

## 2017-09-07 MED ORDER — DEXTROSE 5 % IV SOLN
1.0000 g | INTRAVENOUS | Status: DC
  Filled 2017-09-07: qty 10

## 2017-09-07 MED ORDER — CEFTRIAXONE SODIUM IN DEXTROSE 20 MG/ML IV SOLN
1.0000 g | Freq: Once | INTRAVENOUS | Status: AC
  Administered 2017-09-07: 1 g via INTRAVENOUS
  Filled 2017-09-07: qty 50

## 2017-09-07 MED ORDER — ACETAMINOPHEN 325 MG PO TABS: 650.0000 mg | ORAL_TABLET | Freq: Four times a day (QID) | ORAL | Status: DC | PRN

## 2017-09-07 MED ORDER — IPRATROPIUM-ALBUTEROL 20-100 MCG/ACT IN AERS: 1.0000 | INHALATION_SPRAY | Freq: Four times a day (QID) | RESPIRATORY_TRACT | Status: DC

## 2017-09-07 MED ORDER — DEXTROSE 5 % IV SOLN
500.0000 mg | INTRAVENOUS | Status: DC
  Filled 2017-09-07: qty 500

## 2017-09-07 MED ORDER — PANTOPRAZOLE SODIUM 40 MG PO TBEC
40.0000 mg | DELAYED_RELEASE_TABLET | Freq: Every day | ORAL | Status: DC
  Administered 2017-09-08: 40 mg via ORAL
  Filled 2017-09-07: qty 1

## 2017-09-07 MED ORDER — POLYETHYLENE GLYCOL 3350 17 G PO PACK
17.0000 g | PACK | Freq: Every day | ORAL | Status: DC
  Filled 2017-09-07: qty 1

## 2017-09-07 MED ORDER — SIMVASTATIN 20 MG PO TABS
40.0000 mg | ORAL_TABLET | Freq: Every day | ORAL | Status: DC
  Administered 2017-09-07: 23:00:00 40 mg via ORAL
  Filled 2017-09-07: qty 2

## 2017-09-07 MED ORDER — AMLODIPINE BESYLATE 5 MG PO TABS
2.5000 mg | ORAL_TABLET | Freq: Every day | ORAL | Status: DC
  Administered 2017-09-08: 09:00:00 2.5 mg via ORAL
  Filled 2017-09-07: qty 1

## 2017-09-07 MED ORDER — ACETAMINOPHEN 500 MG PO TABS
ORAL_TABLET | ORAL | Status: AC
  Administered 2017-09-07: 1000 mg via ORAL
  Filled 2017-09-07: qty 2

## 2017-09-07 MED ORDER — HEPARIN SODIUM (PORCINE) 5000 UNIT/ML IJ SOLN
5000.0000 [IU] | Freq: Three times a day (TID) | INTRAMUSCULAR | Status: DC
  Administered 2017-09-07 – 2017-09-08 (×2): 5000 [IU] via SUBCUTANEOUS
  Filled 2017-09-07 (×2): qty 1

## 2017-09-07 MED ORDER — ZOSTER VAC RECOMB ADJUVANTED 50 MCG/0.5ML IM SUSR: 0.5000 mL | Freq: Once | INTRAMUSCULAR | Status: DC

## 2017-09-07 MED ORDER — ASPIRIN EC 81 MG PO TBEC: 81.0000 mg | DELAYED_RELEASE_TABLET | Freq: Every day | ORAL | Status: DC

## 2017-09-07 MED ORDER — IPRATROPIUM-ALBUTEROL 0.5-2.5 (3) MG/3ML IN SOLN
3.0000 mL | Freq: Four times a day (QID) | RESPIRATORY_TRACT | Status: DC
  Administered 2017-09-07 (×2): 3 mL via RESPIRATORY_TRACT
  Filled 2017-09-07: qty 3

## 2017-09-07 MED ORDER — SODIUM CHLORIDE 0.9 % IV SOLN
INTRAVENOUS | Status: DC
  Administered 2017-09-07: 18:00:00 via INTRAVENOUS

## 2017-09-07 MED ORDER — DOCUSATE SODIUM 100 MG PO CAPS
100.0000 mg | ORAL_CAPSULE | Freq: Two times a day (BID) | ORAL | Status: DC | PRN
Start: 2017-09-07 — End: 2017-09-08
  Administered 2017-09-08: 07:00:00 100 mg via ORAL
  Filled 2017-09-07: qty 1

## 2017-09-07 MED ORDER — CLOPIDOGREL BISULFATE 75 MG PO TABS
75.0000 mg | ORAL_TABLET | Freq: Every day | ORAL | Status: DC
  Administered 2017-09-08: 09:00:00 75 mg via ORAL
  Filled 2017-09-07: qty 1

## 2017-09-07 MED ORDER — ENOXAPARIN SODIUM 30 MG/0.3ML ~~LOC~~ SOLN: 30.0000 mg | SUBCUTANEOUS | Status: DC

## 2017-09-07 MED ORDER — ALPRAZOLAM 0.5 MG PO TABS: 0.5000 mg | ORAL_TABLET | Freq: Two times a day (BID) | ORAL | Status: DC | PRN

## 2017-09-07 NOTE — H&P (Signed)
[facility] [place] NAME: [name]    MR#:  [id number]  DATE OF BIRTH:  [birth year]  DATE OF ADMISSION:  09/07/2017  PRIMARY CARE PHYSICIAN: [name], MD   REQUESTING/REFERRING PHYSICIAN: Archie Balboa  CHIEF COMPLAINT:    Leg cramps and nausea  HISTORY OF PRESENT ILLNESS:  [name]  is a 83 y.o. female with a known history of coronary artery disease, hypertension, hyperlipidemia and other medical problems is presenting to the ED with a chief complaint of leg cramps. Renal function is worse than her baseline. Creatinine at 1.43. Chest x-ray has revealed a pneumonia. Patient is started on IV antibiotics and hospitalist team is called to admit the patient. Patient denies any chest pain but has intermittent episodes of cough  PAST MEDICAL HISTORY:   Past Medical History:  Diagnosis Date  . C. difficile colitis   . CAD (coronary artery disease)    a. Nuclear 10/11, not gated, no scar or ischemia, EF 68%, low risk study; b. [place] 06/18/14: no evidence of infarction or ischemia, no WMA, normalization of TWI in precordial leads, equivocal EKG changes EF 77%, low risk study  . Carotid artery disease ([place])    Doppler January, 2012, 40-59% bilateral, followup one year  . COPD (chronic obstructive pulmonary disease) ([place])   . Diffuse cystic mastopathy   . Diverticulitis    last colonoscopy  incomplete Jan 2011  . Dizziness    stable now (Oct 2011) patient tells me question of TIA, we will obtain records  . Gait abnormality    Patient complains of "wobbly gait", December, 2013  . GERD (gastroesophageal reflux disease)   . History of migraines   . Hx of CABG    a. 3 vessel in 1999  . Hyperlipidemia   . Hypertension   . Indigestion    3 weeks, October 2011  . Kidney stones   . Rheumatic fever   . Sinus bradycardia    Mild, December, 2013  . SOB (shortness of breath)   . Syncopal episodes    after 18 holes  of golf and increase hydrochlorothiazide, resolved     PAST SURGICAL HISTOIRY:   Past Surgical History:  Procedure Laterality Date  . APPENDECTOMY  1950  . BREAST BIOPSY Right 1994  . BREAST CYST ASPIRATION     multiple BIL  . CATARACT EXTRACTION Right 2009  . CATARACT EXTRACTION Left 2011  . COLONOSCOPY  [id number]   Dr. Jamal Collin  . CORONARY ARTERY BYPASS GRAFT  1999  . esophagus stretched    . TONSILLECTOMY  1939  . TOTAL ABDOMINAL HYSTERECTOMY  1968   due to metrorrhagia    SOCIAL HISTORY:   Social History   Tobacco Use  . Smoking status: Former Smoker    Packs/day: 0.50    Years: 40.00    Pack years: 20.00    Types: Cigarettes    Last attempt to quit: 09/20/1983    Years since quitting: 33.9  . Smokeless tobacco: Never Used  Substance Use Topics  . Alcohol use: Yes    Comment: yes, social wine    FAMILY HISTORY:   Family History  Problem Relation Age of Onset  . Heart disease Mother   . Heart disease Father   . Cancer Neg Hx   . Drug abuse Neg Hx   . Breast cancer Neg Hx     DRUG ALLERGIES:   Allergies  Allergen Reactions  . Codeine Nausea  And Vomiting    REVIEW OF SYSTEMS:  CONSTITUTIONAL: No fever, fatigue or weakness.  EYES: No blurred or double vision.  EARS, NOSE, AND THROAT: No tinnitus or ear pain.  RESPIRATORY: No cough, shortness of breath, wheezing or hemoptysis.  CARDIOVASCULAR: No chest pain, orthopnea, edema.  GASTROINTESTINAL: No nausea, vomiting, diarrhea or abdominal pain.  GENITOURINARY: No dysuria, hematuria.  ENDOCRINE: No polyuria, nocturia,  HEMATOLOGY: No anemia, easy bruising or bleeding SKIN: No rash or lesion. MUSCULOSKELETAL:  Patient is reporting leg cramps NEUROLOGIC: No tingling, numbness, weakness.  PSYCHIATRY: No anxiety or depression.   MEDICATIONS AT HOME:   Prior to Admission medications   Medication Sig Start Date End Date Taking? Authorizing Provider  acetaminophen (TYLENOL) 325 MG tablet Take 2 tablets  (650 mg total) by mouth every 6 (six) hours as needed for mild pain (or Fever >/= 101). 03/10/17  Yes Isaish Alemu, MD  ALPRAZolam (XANAX) 0.25 MG tablet TAKE 1-2 TABLETS BY MOUTH AT BEDTIME AS NEEDED FOR INSOMNIA 08/04/17  Yes [name], MD  amLODipine (NORVASC) 2.5 MG tablet TAKE 1 TABLET BY MOUTH DAILY 06/16/17  Yes [name], MD  aspirin 81 MG tablet Take 81 mg by mouth daily.     Yes [provider]  clopidogrel (PLAVIX) 75 MG tablet TAKE 1 TABLET BY MOUTH ONE TIME DAILY 03/13/17  Yes [name], MD  docusate sodium (COLACE) 100 MG capsule Take 1 capsule (100 mg total) by mouth 2 (two) times daily as needed for mild constipation. 03/10/17  Yes Calven Gilkes, Illene Silver, MD  esomeprazole (NEXIUM) 40 MG capsule TAKE 1 CAPSULE DAILY BEFORE BREAKFAST 05/09/13  Yes [name], MD  Ipratropium-Albuterol (COMBIVENT RESPIMAT) 20-100 MCG/ACT AERS respimat Inhale 1 puff into the lungs every 6 (six) hours. 01/25/17  Yes Wilhelmina Mcardle, MD  losartan (COZAAR) 100 MG tablet TAKE 1 TABLET BY MOUTH DAILY 03/13/17  Yes [name], MD  nitroGLYCERIN (NITROSTAT) 0.4 MG SL tablet DISSOLVE 1 TABLET UNDER TONGUE EVERY FIVE MINUTES UP TO 3 DOSES AS NEEDED 06/23/17  Yes Gollan, Kathlene November, MD  polyethylene glycol (MIRALAX / GLYCOLAX) packet Take 17 g by mouth daily. 03/10/17  Yes Alexis Mizuno, Illene Silver, MD  simvastatin (ZOCOR) 40 MG tablet Take 1 tablet (40 mg total) by mouth at bedtime. 01/16/17  Yes Minna Merritts, MD  [facility] injection  05/15/17   [provider]      VITAL SIGNS:  Blood pressure (!) 94/53, pulse 65, temperature 98.7 F (37.1 C), temperature source Oral, resp. rate 20, height 5\' 3"  (1.6 m), weight 61.1 kg (134 lb 12.8 oz), SpO2 98 %.  PHYSICAL EXAMINATION:  GENERAL:  83 y.o.-year-old patient lying in the bed with no acute distress.  EYES: Pupils equal, round, reactive to light and accommodation. No scleral icterus. Extraocular muscles intact.  HEENT: Head atraumatic,  normocephalic. Oropharynx and nasopharynx clear.  NECK:  Supple, no jugular venous distention. No thyroid enlargement, no tenderness.  LUNGS: Normal breath sounds bilaterally, no wheezing, rales,rhonchi or crepitation. No use of accessory muscles of respiration.  CARDIOVASCULAR: S1, S2 normal. No murmurs, rubs, or gallops.  ABDOMEN: Soft, nontender, nondistended. Bowel sounds present. No organomegaly or mass.  EXTREMITIES: No pedal edema, cyanosis, or clubbing.  No calf muscle tenderness NEUROLOGIC: Cranial nerves II through XII are intact. Sensation intact. Gait not checked.  PSYCHIATRIC: The patient is alert and oriented x 3.  SKIN: No obvious rash, lesion, or ulcer.   LABORATORY PANEL:   CBC Recent Labs  Lab 09/07/17 1113  WBC 7.8  HGB 14.2  HCT 42.1  PLT 175   ------------------------------------------------------------------------------------------------------------------  Chemistries  Recent Labs  Lab 09/07/17 1113  NA 138  K 4.2  CL 105  CO2 25  GLUCOSE 141*  BUN 24*  CREATININE 1.43*  CALCIUM 8.6*  AST 28  ALT 16  ALKPHOS 66  BILITOT 0.9   ------------------------------------------------------------------------------------------------------------------  Cardiac Enzymes No results for input(s): TROPONINI in the last 168 hours. ------------------------------------------------------------------------------------------------------------------  RADIOLOGY:  Dg Chest 2 View  Result Date: 09/07/2017 CLINICAL DATA:  83 year old female with lower extremity pain since last night. Nausea and dry heaves this morning. Febrile, 101.6. EXAM: CHEST  2 VIEW COMPARISON:  12/21/2016 chest radiographs. FINDINGS: Seated AP and lateral views of the chest. Chronic large lung volumes. Prior CABG. Stable cardiac size and mediastinal contours, with chronic hiatal hernia. Visualized tracheal air column is within normal limits. No pneumothorax or pulmonary edema. No pleural effusion  identified. However, there is abnormal confluent opacity throughout the anterior right lung base, possibly involving the right middle lobe and/or anterior basal segment of the lower lobe. No acute osseous abnormality identified. Calcified aortic atherosclerosis. Visible bowel gas pattern within normal limits. IMPRESSION: 1. Confluent right middle and/or anterior basal segment lower lobe opacity suspicious for pneumonia. In the setting of vomiting consider aspiration. No pleural effusion. 2. Followup PA and lateral chest X-ray is recommended in 3-4 weeks following trial of antibiotic therapy to ensure resolution and exclude underlying malignancy. 3. Chronic pulmonary hyperinflation and hiatal hernia. Electronically Signed   By: Genevie Ann M.D.   On: 09/07/2017 12:22    EKG:   Orders placed or performed in visit on 07/17/17  . EKG 12-Lead    IMPRESSION AND PLAN:   83 year old female presenting to the ED with a chief complaint of leg cramps and was hypoxic in the ED. Chest x-ray has revealed pneumonia.  #Pneumonia-community acquired Admit patient to MedSurg unit Check sputum culture and sensitivity Check urine for Legionella antigen Rocephin and azithromycin  #Acute kidney injury Hydrate with IV fluids Baseline creatinine 1.1 Avoid nephrotoxins and monitor renal function  holding her home medication Cozaar  #Essential hypertension continue home medication amlodipine  #Hyperlipidemia continue statin  #History of coronary artery disease Patient is stable medically at this time. Continue home medication aspirin, Plavix, statin Patient is not on beta blocker  DVT prophylaxis with Lovenox   All the records are reviewed and case discussed with ED provider. Management plans discussed with the patient, family and they are in agreement.  CODE STATUS: fc , daughter is the healthcare power of attorney  TOTAL TIME TAKING CARE OF THIS PATIENT: 43 minutes.   Note: This dictation was prepared  with Dragon dictation along with smaller phrase technology. Any transcriptional errors that result from this process are unintentional.  Nicholes Mango M.D on 09/07/2017 at 3:48 PM  Between 7am to 6pm - Pager - [phone]  After 6pm go to [email or web address] - password [email or web address] [facility]  Office  [phone]  CC: Primary care physician; [name], MD

## 2017-09-07 NOTE — ED Triage Notes (Signed)
Pt brought in by [facility] from home for leg cramps that started last night. Pt has also had nausea and dry heaves since this morning. Per EMS pt had BM x 1 this morning but states pt is unsure if it was diarrhea. Pt in NAD at this time. Pt was febrile with EMS at 101.6 axillary.

## 2017-09-07 NOTE — ED Notes (Signed)
Patient transported to X-ray 

## 2017-09-07 NOTE — ED Provider Notes (Signed)
[facility] Emergency Department Provider Note   ____________________________________________   I have reviewed the triage vital signs and the nursing notes.   HISTORY  Chief Complaint leg cramps and Nausea   History limited by: Not Limited   HPI [name] is a 83 y.o. female who presents to the emergency department today with primary complaint of leg cramps   LOCATION:bilateral thighs DURATION:months TIMING: worse today SEVERITY: severe QUALITY: cramping CONTEXT: patient states that she has had leg cramps for months. Were worse last night. Has not spoken to her PCP about these symptoms. Also developed some nausea today. No true vomiting. No diarrhea. No recent abx use. MODIFYING FACTORS: none ASSOCIATED SYMPTOMS: nausea  Per medical record review patient has a history of c. Dif, diverticulitis, indigestion.   Past Medical History:  Diagnosis Date  . C. difficile colitis   . CAD (coronary artery disease)    a. Nuclear 10/11, not gated, no scar or ischemia, EF 68%, low risk study; b. [place] 06/18/14: no evidence of infarction or ischemia, no WMA, normalization of TWI in precordial leads, equivocal EKG changes EF 77%, low risk study  . Carotid artery disease ([place])    Doppler January, 2012, 40-59% bilateral, followup one year  . COPD (chronic obstructive pulmonary disease) ([place])   . Diffuse cystic mastopathy   . Diverticulitis    last colonoscopy  incomplete Jan 2011  . Dizziness    stable now (Oct 2011) patient tells me question of TIA, we will obtain records  . Gait abnormality    Patient complains of "wobbly gait", December, 2013  . GERD (gastroesophageal reflux disease)   . History of migraines   . Hx of CABG    a. 3 vessel in 1999  . Hyperlipidemia   . Hypertension   . Indigestion    3 weeks, October 2011  . Kidney stones   . Rheumatic fever   . Sinus bradycardia    Mild, December, 2013  . SOB (shortness of breath)    . Syncopal episodes    after 18 holes of golf and increase hydrochlorothiazide, resolved     Patient Active Problem List   Diagnosis Date Noted  . Pelvic fracture ([place]) 03/08/2017  . Allergic rhinitis 12/31/2016  . Hospital discharge follow-up 12/31/2016  . Change in bowel movement 08/20/2016  . SCC (squamous cell carcinoma), face 08/01/2016  . EKG abnormalities 06/17/2016  . Osteoporosis of forearm 02/13/2016  . Dizziness and giddiness 02/13/2016  . Other specified symptoms and signs involving the circulatory and respiratory systems 01/31/2016  . Insomnia secondary to anxiety 07/12/2015  . Anxiety disorder 07/12/2015  . Abnormal weight loss 02/09/2015  . Essential (primary) hypertension 10/07/2014  . Syncope and collapse   . Sinus bradycardia   . Hardening of the aorta (main artery of the heart) ([place]) 06/26/2014  . History of Clostridium difficile colitis 10/20/2013  . Personal history of other diseases of the digestive system 10/20/2013  . Enterocolitis due to Clostridium difficile 10/20/2013  . Low back pain 10/04/2013  . Encounter for general adult medical examination without abnormal findings 02/07/2013  . Chest pain at rest 12/26/2011  . Chronic obstructive pulmonary disease ([place]) 12/05/2011  . Presence of aortocoronary bypass graft   . Chronic kidney disease, stage III (moderate) ([place]) 09/25/2011  . Hyperlipidemia   . Hypertension   . SOB (shortness of breath)   . Coronary artery disease of native artery of native heart with stable angina pectoris ([place])   .  Carotid artery disease [facility])     Past Surgical History:  Procedure Laterality Date  . APPENDECTOMY  1950  . BREAST BIOPSY Right 1994  . BREAST CYST ASPIRATION     multiple BIL  . CATARACT EXTRACTION Right 2009  . CATARACT EXTRACTION Left 2011  . COLONOSCOPY  4098,1191   Dr. Jamal Collin  . CORONARY ARTERY BYPASS GRAFT  1999  . esophagus stretched    . TONSILLECTOMY  1939  . TOTAL ABDOMINAL HYSTERECTOMY  1968    due to metrorrhagia    Prior to Admission medications   Medication Sig Start Date End Date Taking? Authorizing Provider  acetaminophen (TYLENOL) 325 MG tablet Take 2 tablets (650 mg total) by mouth every 6 (six) hours as needed for mild pain (or Fever >/= 101). 03/10/17   Gouru, Aruna, MD  ALPRAZolam (XANAX) 0.25 MG tablet TAKE 1-2 TABLETS BY MOUTH AT BEDTIME AS NEEDED FOR INSOMNIA 08/04/17   [name], MD  amLODipine (NORVASC) 2.5 MG tablet TAKE 1 TABLET BY MOUTH DAILY 06/16/17   [name], MD  aspirin 81 MG tablet Take 81 mg by mouth daily.      [provider]  clopidogrel (PLAVIX) 75 MG tablet TAKE 1 TABLET BY MOUTH ONE TIME DAILY 03/13/17   [name], MD  docusate sodium (COLACE) 100 MG capsule Take 1 capsule (100 mg total) by mouth 2 (two) times daily as needed for mild constipation. 03/10/17   Nicholes Mango, MD  esomeprazole (NEXIUM) 40 MG capsule TAKE 1 CAPSULE DAILY BEFORE BREAKFAST 05/09/13   [name], MD  Ipratropium-Albuterol (COMBIVENT RESPIMAT) 20-100 MCG/ACT AERS respimat Inhale 1 puff into the lungs every 6 (six) hours. 01/25/17   Wilhelmina Mcardle, MD  losartan (COZAAR) 100 MG tablet TAKE 1 TABLET BY MOUTH DAILY 03/13/17   [name], MD  nitroGLYCERIN (NITROSTAT) 0.4 MG SL tablet DISSOLVE 1 TABLET UNDER TONGUE EVERY FIVE MINUTES UP TO 3 DOSES AS NEEDED 06/23/17   Minna Merritts, MD  polyethylene glycol (MIRALAX / GLYCOLAX) packet Take 17 g by mouth daily. 03/10/17   Nicholes Mango, MD  [facility] injection  05/15/17   [provider]  simvastatin (ZOCOR) 40 MG tablet Take 1 tablet (40 mg total) by mouth at bedtime. 01/16/17   Minna Merritts, MD    Allergies Codeine  Family History  Problem Relation Age of Onset  . Heart disease Mother   . Heart disease Father   . Cancer Neg Hx   . Drug abuse Neg Hx   . Breast cancer Neg Hx     Social History Social History   Tobacco Use  . Smoking status: Former Smoker    Packs/day: 0.50     Years: 40.00    Pack years: 20.00    Types: Cigarettes    Last attempt to quit: 09/20/1983    Years since quitting: 33.9  . Smokeless tobacco: Never Used  Substance Use Topics  . Alcohol use: Yes    Comment: yes, social wine  . Drug use: No    Review of Systems Constitutional: No fever/chills Eyes: No visual changes. ENT: No sore throat. Cardiovascular: Denies chest pain. Respiratory: Denies shortness of breath. Gastrointestinal: No abdominal pain.  No nausea, no vomiting.  No diarrhea.   Genitourinary: Negative for dysuria. Musculoskeletal: Bilateral leg cramps. Skin: Negative for rash. Neurological: Negative for headaches, focal weakness or numbness.  ____________________________________________   PHYSICAL EXAM:  VITAL SIGNS: ED Triage Vitals [09/07/17 4782]  NFA  Vitals Group     BP (!) 142/64     Pulse Rate 82     Resp      Temp 99.3 F (37.4 C)     Temp Source Oral     SpO2 (!) 86 %     Weight      Height      Head Circumference      Peak Flow      Pain Score 6   Constitutional: Alert and oriented. Well appearing and in no distress. Eyes: Conjunctivae are normal.  ENT   Head: Normocephalic and atraumatic.   Nose: No congestion/rhinnorhea.   Mouth/Throat: Mucous membranes are moist.   Neck: No stridor. Hematological/Lymphatic/Immunilogical: No cervical lymphadenopathy. Cardiovascular: Normal rate, regular rhythm.  No murmurs, rubs, or gallops.  Respiratory: Normal respiratory effort without tachypnea nor retractions. Breath sounds are clear and equal bilaterally. No wheezes/rales/rhonchi. Gastrointestinal: Soft and non tender. No rebound. No guarding.  Genitourinary: Deferred Musculoskeletal: Normal range of motion in all extremities. No lower extremity edema. Neurologic:  Normal speech and language. No gross focal neurologic deficits are appreciated.  Skin:  Skin is warm, dry and intact. No rash noted. Psychiatric: Mood and affect are normal.  Speech and behavior are normal. Patient exhibits appropriate insight and judgment.  ____________________________________________    LABS (pertinent positives/negatives)  Lipase 36 CMP cr 1.43, glu 141 CBC wnl  ____________________________________________   EKG  None  ____________________________________________    RADIOLOGY  CXR Concerning for right sided pneumonia  ____________________________________________   PROCEDURES  Procedures  ____________________________________________   INITIAL IMPRESSION / ASSESSMENT AND PLAN / ED COURSE  Pertinent labs & imaging results that were available during my care of the patient were reviewed by me and considered in my medical decision making (see chart for details).  Patient presented to the emergency department today with worsening leg cramping as well as nausea.  Vital signs were concerning for hypoxia on room air.  Differential would include electrolyte abnormalities causing cramping, dehydration, infection or possible pneumonia, pneumothorax or PE causing hypoxia.  Workup does show a slight elevation of creatinine.  Additionally chest x-ray concerning for pneumonia.  Given hypoxia and x-ray findings will plan on admission for IV antibiotics.  Discussed with patient and family.  ____________________________________________   FINAL CLINICAL IMPRESSION(S) / ED DIAGNOSES  Final diagnoses:  Nausea  Leg cramp  Community acquired pneumonia of right lung, unspecified part of lung  Hypoxia     Note: This dictation was prepared with Sales executive. Any transcriptional errors that result from this process are unintentional     Nance Pear, MD 09/07/17 [phone]

## 2017-09-07 NOTE — Progress Notes (Signed)
Pharmacist-Provider Communication:  Order for enoxaparin for DVT prophylaxis was changed to heparin 5000 units subcutaneously TID per protocol given renal function.  Lenis Noon, PharmD 09/07/17 3:57 PM

## 2017-09-07 NOTE — ED Notes (Signed)
ED Provider at bedside. 

## 2017-09-07 NOTE — ED Notes (Signed)
Patient returned from radiology

## 2017-09-07 NOTE — Plan of Care (Signed)
  Respiratory: Ability to maintain adequate ventilation will improve 09/07/2017 1846 - Progressing by Oris Drone, RN  O2 weaned pt from 2L to 1L this shift; pt on RA at home

## 2017-09-08 ENCOUNTER — Telehealth: Payer: Self-pay | Admitting: Internal Medicine

## 2017-09-08 DIAGNOSIS — R0902 Hypoxemia: Secondary | ICD-10-CM | POA: Diagnosis not present

## 2017-09-08 DIAGNOSIS — E860 Dehydration: Secondary | ICD-10-CM | POA: Diagnosis not present

## 2017-09-08 DIAGNOSIS — I2510 Atherosclerotic heart disease of native coronary artery without angina pectoris: Secondary | ICD-10-CM | POA: Diagnosis not present

## 2017-09-08 DIAGNOSIS — N179 Acute kidney failure, unspecified: Secondary | ICD-10-CM | POA: Diagnosis not present

## 2017-09-08 DIAGNOSIS — J440 Chronic obstructive pulmonary disease with acute lower respiratory infection: Secondary | ICD-10-CM | POA: Diagnosis not present

## 2017-09-08 DIAGNOSIS — E785 Hyperlipidemia, unspecified: Secondary | ICD-10-CM | POA: Diagnosis not present

## 2017-09-08 DIAGNOSIS — J189 Pneumonia, unspecified organism: Secondary | ICD-10-CM | POA: Diagnosis not present

## 2017-09-08 DIAGNOSIS — I10 Essential (primary) hypertension: Secondary | ICD-10-CM | POA: Diagnosis not present

## 2017-09-08 LAB — BASIC METABOLIC PANEL
Anion gap: 3 — ABNORMAL LOW (ref 5–15)
BUN: 21 mg/dL — AB (ref 6–20)
CHLORIDE: 109 mmol/L (ref 101–111)
CO2: 26 mmol/L (ref 22–32)
Calcium: 7.4 mg/dL — ABNORMAL LOW (ref 8.9–10.3)
Creatinine, Ser: 1.23 mg/dL — ABNORMAL HIGH (ref 0.44–1.00)
GFR calc Af Amer: 44 mL/min — ABNORMAL LOW (ref >60)
GFR, EST NON AFRICAN AMERICAN: 38 mL/min — AB (ref >60)
GLUCOSE: 107 mg/dL — AB (ref 65–99)
POTASSIUM: 4.1 mmol/L (ref 3.5–5.1)
Sodium: 138 mmol/L (ref 135–145)

## 2017-09-08 LAB — EXPECTORATED SPUTUM ASSESSMENT W GRAM STAIN, RFLX TO RESP C

## 2017-09-08 LAB — EXPECTORATED SPUTUM ASSESSMENT W REFEX TO RESP CULTURE

## 2017-09-08 LAB — STREP PNEUMONIAE URINARY ANTIGEN: Strep Pneumo Urinary Antigen: NEGATIVE

## 2017-09-08 MED ORDER — LEVOFLOXACIN 500 MG PO TABS: 500.0000 mg | ORAL_TABLET | Freq: Every day | ORAL | 0 refills | Status: DC

## 2017-09-08 MED ORDER — IPRATROPIUM-ALBUTEROL 0.5-2.5 (3) MG/3ML IN SOLN: 3.0000 mL | Freq: Four times a day (QID) | RESPIRATORY_TRACT | Status: DC | PRN

## 2017-09-08 MED ORDER — IPRATROPIUM-ALBUTEROL 0.5-2.5 (3) MG/3ML IN SOLN
3.0000 mL | Freq: Three times a day (TID) | RESPIRATORY_TRACT | Status: DC
  Filled 2017-09-08 (×2): qty 3

## 2017-09-08 NOTE — Telephone Encounter (Signed)
Yes ok to give verbal order

## 2017-09-08 NOTE — Telephone Encounter (Signed)
Copied from [place]. Topic: Quick Communication - See Telephone Encounter >> Sep 08, 2017  2:10 PM Cleaster Corin, [place] wrote: CRM for notification. See Telephone encounter for:   09/08/17. Denise from [place] will need a verbal order from Dr. Derrel Nip to start care over the weekend. Langley Gauss can be reached at [phone]

## 2017-09-08 NOTE — Telephone Encounter (Signed)
Please advise 

## 2017-09-08 NOTE — Progress Notes (Signed)
Pt is being discharged today, discharge paperwork was reviewed with the patient and her daughter. She verified understanding. 0 paper prescriptions were given to her. IV x1 was removed, all belongings packed and returned to the patient.

## 2017-09-08 NOTE — Telephone Encounter (Signed)
Denise from [facility] called back and wanted Dr. Derrel Nip to disregard the order request. States it was for the wrong pt.

## 2017-09-08 NOTE — Telephone Encounter (Signed)
Order shredded

## 2017-09-08 NOTE — Telephone Encounter (Signed)
Is it okay to give verbal orders?

## 2017-09-08 NOTE — Telephone Encounter (Signed)
FYI

## 2017-09-08 NOTE — Telephone Encounter (Signed)
Ok , please shred the order ,  thanks

## 2017-09-08 NOTE — Care Management (Signed)
Attempted assessment and patient had already been discharged home by car.

## 2017-09-08 NOTE — Discharge Instructions (Signed)
Resume diet and activity as before ° ° °

## 2017-09-09 LAB — HIV ANTIBODY (ROUTINE TESTING W REFLEX): HIV SCREEN 4TH GENERATION: NONREACTIVE

## 2017-09-12 LAB — CULTURE, BLOOD (ROUTINE X 2)
CULTURE: NO GROWTH
CULTURE: NO GROWTH
CULTURE: NO GROWTH
Culture: NO GROWTH
Special Requests: ADEQUATE
Special Requests: ADEQUATE
Special Requests: ADEQUATE
Special Requests: ADEQUATE

## 2017-09-13 ENCOUNTER — Emergency Department
Admission: EM | Admit: 2017-09-13 | Discharge: 2017-09-13 | Disposition: A | Discharge status: Home / Self Care | Payer: Medicare Other | Attending: Emergency Medicine | Admitting: Emergency Medicine

## 2017-09-13 ENCOUNTER — Other Ambulatory Visit: Payer: Self-pay

## 2017-09-13 ENCOUNTER — Telehealth: Payer: Self-pay | Admitting: Cardiovascular Disease

## 2017-09-13 ENCOUNTER — Emergency Department: Payer: Medicare Other

## 2017-09-13 ENCOUNTER — Telehealth: Payer: Self-pay

## 2017-09-13 DIAGNOSIS — Z885 Allergy status to narcotic agent status: Secondary | ICD-10-CM | POA: Insufficient documentation

## 2017-09-13 DIAGNOSIS — Z87891 Personal history of nicotine dependence: Secondary | ICD-10-CM | POA: Diagnosis not present

## 2017-09-13 DIAGNOSIS — M546 Pain in thoracic spine: Secondary | ICD-10-CM | POA: Diagnosis not present

## 2017-09-13 DIAGNOSIS — N183 Chronic kidney disease, stage 3 (moderate): Secondary | ICD-10-CM | POA: Insufficient documentation

## 2017-09-13 DIAGNOSIS — Z7902 Long term (current) use of antithrombotics/antiplatelets: Secondary | ICD-10-CM | POA: Insufficient documentation

## 2017-09-13 DIAGNOSIS — Z79899 Other long term (current) drug therapy: Secondary | ICD-10-CM | POA: Diagnosis not present

## 2017-09-13 DIAGNOSIS — Z7982 Long term (current) use of aspirin: Secondary | ICD-10-CM | POA: Insufficient documentation

## 2017-09-13 DIAGNOSIS — J189 Pneumonia, unspecified organism: Secondary | ICD-10-CM | POA: Diagnosis not present

## 2017-09-13 DIAGNOSIS — Z951 Presence of aortocoronary bypass graft: Secondary | ICD-10-CM | POA: Insufficient documentation

## 2017-09-13 DIAGNOSIS — I129 Hypertensive chronic kidney disease with stage 1 through stage 4 chronic kidney disease, or unspecified chronic kidney disease: Secondary | ICD-10-CM | POA: Diagnosis not present

## 2017-09-13 DIAGNOSIS — M25512 Pain in left shoulder: Secondary | ICD-10-CM | POA: Diagnosis not present

## 2017-09-13 DIAGNOSIS — I2510 Atherosclerotic heart disease of native coronary artery without angina pectoris: Secondary | ICD-10-CM | POA: Diagnosis not present

## 2017-09-13 DIAGNOSIS — G8929 Other chronic pain: Secondary | ICD-10-CM | POA: Insufficient documentation

## 2017-09-13 DIAGNOSIS — J449 Chronic obstructive pulmonary disease, unspecified: Secondary | ICD-10-CM | POA: Diagnosis not present

## 2017-09-13 DIAGNOSIS — R05 Cough: Secondary | ICD-10-CM | POA: Diagnosis not present

## 2017-09-13 DIAGNOSIS — I10 Essential (primary) hypertension: Secondary | ICD-10-CM | POA: Diagnosis not present

## 2017-09-13 LAB — BASIC METABOLIC PANEL
Anion gap: 8 (ref 5–15)
BUN: 26 mg/dL — AB (ref 6–20)
CALCIUM: 9.1 mg/dL (ref 8.9–10.3)
CHLORIDE: 104 mmol/L (ref 101–111)
CO2: 25 mmol/L (ref 22–32)
CREATININE: 1.67 mg/dL — AB (ref 0.44–1.00)
GFR calc Af Amer: 30 mL/min — ABNORMAL LOW (ref >60)
GFR, EST NON AFRICAN AMERICAN: 26 mL/min — AB (ref >60)
Glucose, Bld: 112 mg/dL — ABNORMAL HIGH (ref 65–99)
Potassium: 4.5 mmol/L (ref 3.5–5.1)
SODIUM: 137 mmol/L (ref 135–145)

## 2017-09-13 LAB — CBC
HCT: 38.6 % (ref 35.0–47.0)
Hemoglobin: 13.2 g/dL (ref 12.0–16.0)
MCH: 34 pg (ref 26.0–34.0)
MCHC: 34.2 g/dL (ref 32.0–36.0)
MCV: 99.5 fL (ref 80.0–100.0)
PLATELETS: 171 10*3/uL (ref 150–440)
RBC: 3.88 MIL/uL (ref 3.80–5.20)
RDW: 12.8 % (ref 11.5–14.5)
WBC: 6.4 10*3/uL (ref 3.6–11.0)

## 2017-09-13 LAB — TROPONIN I: Troponin I: <0.03 ng/mL (ref <0.03)

## 2017-09-13 MED ORDER — SODIUM CHLORIDE 0.9 % IV BOLUS (SEPSIS)
250.0000 mL | Freq: Once | INTRAVENOUS | Status: AC
  Administered 2017-09-13: 250 mL via INTRAVENOUS

## 2017-09-13 MED ORDER — IPRATROPIUM-ALBUTEROL 20-100 MCG/ACT IN AERS: 1.0000 | INHALATION_SPRAY | Freq: Four times a day (QID) | RESPIRATORY_TRACT | 10 refills | Status: DC

## 2017-09-13 MED ORDER — ACETAMINOPHEN 325 MG PO TABS
650.0000 mg | ORAL_TABLET | Freq: Once | ORAL | Status: AC
  Administered 2017-09-13: 650 mg via ORAL
  Filled 2017-09-13: qty 2

## 2017-09-13 NOTE — Telephone Encounter (Signed)
Called patient to FU on ER visit and patient was having complaint of left sided chest pain and under shoulder blade near middle of chest with SOB , patient was DX with right lower lobe pneumonia, due to patient HX of CAD and Hardening of aorta advised patient to call cardiology and or go to ER. To be evaluated . Patient stated she would try cardiologist office first, and go to ER if cardiology recommended.

## 2017-09-13 NOTE — Telephone Encounter (Signed)
Copied from [place] [phone]. Topic: Appointment Scheduling - Scheduling Inquiry for Clinic >> Sep 13, 2017 11:07 AM Conception Chancy, [place] wrote: Reason for CRM: patient was in the hospital for pneumonia and is needing a hospital follow up with Dr. Derrel Nip - schedule is full, please advise and call patient back.

## 2017-09-13 NOTE — Telephone Encounter (Signed)
Pt calling stating she's having pain in her shoulder blade along with SOB She was advised by pcp to call us Pt c/o Shortness Of Breath: STAT if SOB developed within the last 24 hours or pt is noticeably SOB on the phone  1. Are you currently SOB (can you hear that pt is SOB on the phone)? No   2. How long have you been experiencing SOB? Comes and goes, going on for about 2 days  3. Are you SOB when sitting or when up moving around? Up and down stairs more sob  4. Are you currently experiencing any other symptoms? None She states she used a nitro last night and that did help Would like some advise on this  Please call back

## 2017-09-13 NOTE — Telephone Encounter (Signed)
Please follow-up with the patient. If she contacted cardiology please see what they advised. I would suggest that she be evaluated in the ED given her symptoms.

## 2017-09-13 NOTE — ED Provider Notes (Signed)
[facility] Emergency Department Provider Note  ____________________________________________   I have reviewed the triage vital signs and the nursing notes. Where available I have reviewed prior notes and, if possible and indicated, outside hospital notes.    HISTORY  Chief Complaint Arm Pain and Chest Pain    HPI [name] is a 83 y.o. female with a history of CAD, COPD, diverticulitis, had a pneumonia and went home a proximal he 1 week ago.  After coughing, she is been having left-sided pain in the muscles of her left upper back.  Patient states that this pain is been present nonstop since she began coughing about a week or 2 ago.  She states she fell over the summer and since that time she has had recurrent shoulder pain.  Patient has had multiple visits to different providers for shoulder pain going back since she fell.  He has actually had pain off and on since she fell.  Is not exertional.  Constant.  Worse when she touches it or changes position.  Very focally in the rhomboid muscles of the upper back.  She denies any increased shortness of breath with this.  She is actually breathing better than she was when she had the pneumonia.  She denies any radiation of.  It is not exertional, it is positional and when she touches it it is worse.  Patient has had x-rays specific to the shoulder since this pain started most recently in June, also multiple chest x-rays and she states she has talked to her cardiologist about this they do not feel this pain represents ACS.  Patient does sometimes take nitroglycerin, she states that it does not repeat not help this pain.  She has not taken any Tylenol or ibuprofen.  Patient feels she reaggravated her muscle after coughing a week ago.  This been constant every morning and at night since that.  No history of PE or DVT no recent travel or leg swelling   Location: Left shoulder Radiation: None Quality: He is a pulled  muscle Duration: 6 months off and on worse when she coughs Timing: See above Severity: Mild to moderate Associated sxs: None PriorTreatment : None   Past Medical History:  Diagnosis Date  . C. difficile colitis   . CAD (coronary artery disease)    a. Nuclear 10/11, not gated, no scar or ischemia, EF 68%, low risk study; b. [place] 06/18/14: no evidence of infarction or ischemia, no WMA, normalization of TWI in precordial leads, equivocal EKG changes EF 77%, low risk study  . Carotid artery disease ([place])    Doppler January, 2012, 40-59% bilateral, followup one year  . COPD (chronic obstructive pulmonary disease) ([place])   . Diffuse cystic mastopathy   . Diverticulitis    last colonoscopy  incomplete Jan 2011  . Dizziness    stable now (Oct 2011) patient tells me question of TIA, we will obtain records  . Gait abnormality    Patient complains of "wobbly gait", December, 2013  . GERD (gastroesophageal reflux disease)   . History of migraines   . Hx of CABG    a. 3 vessel in 1999  . Hyperlipidemia   . Hypertension   . Indigestion    3 weeks, October 2011  . Kidney stones   . Rheumatic fever   . Sinus bradycardia    Mild, December, 2013  . SOB (shortness of breath)   . Syncopal episodes    after 18 holes of golf  and increase hydrochlorothiazide, resolved     Patient Active Problem List   Diagnosis Date Noted  . PNA (pneumonia) 09/07/2017  . Pelvic fracture ([place]) 03/08/2017  . Allergic rhinitis 12/31/2016  . Hospital discharge follow-up 12/31/2016  . Change in bowel movement 08/20/2016  . SCC (squamous cell carcinoma), face 08/01/2016  . EKG abnormalities 06/17/2016  . Osteoporosis of forearm 02/13/2016  . Dizziness and giddiness 02/13/2016  . Other specified symptoms and signs involving the circulatory and respiratory systems 01/31/2016  . Insomnia secondary to anxiety 07/12/2015  . Anxiety disorder 07/12/2015  . Abnormal weight loss 02/09/2015  . Essential  (primary) hypertension 10/07/2014  . Syncope and collapse   . Sinus bradycardia   . Hardening of the aorta (main artery of the heart) ([place]) 06/26/2014  . History of Clostridium difficile colitis 10/20/2013  . Personal history of other diseases of the digestive system 10/20/2013  . Enterocolitis due to Clostridium difficile 10/20/2013  . Low back pain 10/04/2013  . Encounter for general adult medical examination without abnormal findings 02/07/2013  . Chest pain at rest 12/26/2011  . Chronic obstructive pulmonary disease ([place]) 12/05/2011  . Presence of aortocoronary bypass graft   . Chronic kidney disease, stage III (moderate) ([place]) 09/25/2011  . Hyperlipidemia   . Hypertension   . SOB (shortness of breath)   . Coronary artery disease of native artery of native heart with stable angina pectoris ([place])   . Carotid artery disease [facility])     Past Surgical History:  Procedure Laterality Date  . APPENDECTOMY  1950  . BREAST BIOPSY Right 1994  . BREAST CYST ASPIRATION     multiple BIL  . CATARACT EXTRACTION Right 2009  . CATARACT EXTRACTION Left 2011  . COLONOSCOPY  [id number]   Dr. Jamal Collin  . CORONARY ARTERY BYPASS GRAFT  1999  . esophagus stretched    . TONSILLECTOMY  1939  . TOTAL ABDOMINAL HYSTERECTOMY  1968   due to metrorrhagia    Prior to Admission medications   Medication Sig Start Date End Date Taking? Authorizing Provider  acetaminophen (TYLENOL) 325 MG tablet Take 2 tablets (650 mg total) by mouth every 6 (six) hours as needed for mild pain (or Fever >/= 101). 03/10/17   Gouru, Aruna, MD  ALPRAZolam (XANAX) 0.25 MG tablet TAKE 1-2 TABLETS BY MOUTH AT BEDTIME AS NEEDED FOR INSOMNIA 08/04/17   [name], MD  amLODipine (NORVASC) 2.5 MG tablet TAKE 1 TABLET BY MOUTH DAILY 06/16/17   [name], MD  aspirin 81 MG tablet Take 81 mg by mouth daily.      [provider]  clopidogrel (PLAVIX) 75 MG tablet TAKE 1 TABLET BY MOUTH ONE TIME DAILY 03/13/17   [name], MD  docusate sodium (COLACE) 100 MG capsule Take 1 capsule (100 mg total) by mouth 2 (two) times daily as needed for mild constipation. 03/10/17   Nicholes Mango, MD  esomeprazole (NEXIUM) 40 MG capsule TAKE 1 CAPSULE DAILY BEFORE BREAKFAST 05/09/13   [name], MD  Ipratropium-Albuterol (COMBIVENT RESPIMAT) 20-100 MCG/ACT AERS respimat Inhale 1 puff into the lungs every 6 (six) hours. 09/13/17   Schuyler Amor, MD  levofloxacin (LEVAQUIN) 500 MG tablet Take 1 tablet (500 mg total) by mouth daily. 09/08/17   Hillary Bow, MD  losartan (COZAAR) 100 MG tablet TAKE 1 TABLET BY MOUTH DAILY 03/13/17   [name], MD  nitroGLYCERIN (NITROSTAT) 0.4 MG SL tablet DISSOLVE 1 TABLET UNDER TONGUE EVERY FIVE MINUTES  UP TO 3 DOSES AS NEEDED 06/23/17   Minna Merritts, MD  polyethylene glycol (MIRALAX / GLYCOLAX) packet Take 17 g by mouth daily. 03/10/17   Nicholes Mango, MD  [facility] injection  05/15/17   [provider]  simvastatin (ZOCOR) 40 MG tablet Take 1 tablet (40 mg total) by mouth at bedtime. 01/16/17   Minna Merritts, MD    Allergies Codeine  Family History  Problem Relation Age of Onset  . Heart disease Mother   . Heart disease Father   . Cancer Neg Hx   . Drug abuse Neg Hx   . Breast cancer Neg Hx     Social History Social History   Tobacco Use  . Smoking status: Former Smoker    Packs/day: 0.50    Years: 40.00    Pack years: 20.00    Types: Cigarettes    Last attempt to quit: 09/20/1983    Years since quitting: 34.0  . Smokeless tobacco: Never Used  Substance Use Topics  . Alcohol use: Yes    Comment: yes, social wine  . Drug use: No    Review of Systems Constitutional: No fever/chills Eyes: No visual changes. ENT: No sore throat. No stiff neck no neck pain Cardiovascular: Denies chest pain. Respiratory: Denies shortness of breath. Gastrointestinal:   no vomiting.  No diarrhea.  No constipation. Genitourinary: Negative for  dysuria. Musculoskeletal: Negative lower extremity swelling Skin: Negative for rash. Neurological: Negative for severe headaches, focal weakness or numbness.   ____________________________________________   PHYSICAL EXAM:  VITAL SIGNS: ED Triage Vitals  Enc Vitals Group     BP 09/13/17 1444 (!) 147/61     Pulse Rate 09/13/17 1444 80     Resp 09/13/17 1444 18     Temp 09/13/17 1444 98.7 F (37.1 C)     Temp Source 09/13/17 1444 Oral     SpO2 09/13/17 1444 98 %     Weight 09/13/17 1445 130 lb (59 kg)     Height 09/13/17 1445 5\' 3"  (1.6 m)     Head Circumference --      Peak Flow --      Pain Score 09/13/17 1443 0     Pain Loc --      Pain Edu? --      Excl. in [place]? --     Constitutional: Alert and oriented. Well appearing and in no acute distress. Eyes: Conjunctivae are normal Head: Atraumatic HEENT: No congestion/rhinnorhea. Mucous membranes are moist.  Oropharynx non-erythematous Neck:   Nontender with no meningismus, no masses, no stridor Cardiovascular: Normal rate, regular rhythm. Grossly normal heart sounds.  Good peripheral circulation. Respiratory: Normal respiratory effort.  No retractions. Lungs CTAB. Abdominal: Soft and nontender. No distention. No guarding no rebound Back: There is very reproducible tenderness to palpation in the paraspinal muscles of the thoracic region, there are is no shingles no erythema is not hot to touch, when I touch this area patient states "ouch that the pain right there" and post back.  Also reproducible with moving the shoulder.  There is no tenderness to the shoulder itself.  There is no midline tenderness there are no lesions noted. there is no CVA tenderness Musculoskeletal: No lower extremity tenderness, no upper extremity tenderness. No joint effusions, no DVT signs strong distal pulses no edema Neurologic:  Normal speech and language. No gross focal neurologic deficits are appreciated.  Skin:  Skin is warm, dry and intact. No rash  noted. Psychiatric: Mood and affect  are normal. Speech and behavior are normal.  ____________________________________________   LABS (all labs ordered are listed, but only abnormal results are displayed)  Labs Reviewed  BASIC METABOLIC PANEL - Abnormal; Notable for the following components:      Result Value   Glucose, Bld 112 (*)    BUN 26 (*)    Creatinine, Ser 1.67 (*)    GFR calc non Af Amer 26 (*)    GFR calc Af Amer 30 (*)    All other components within normal limits  CBC  TROPONIN I  TROPONIN I    Pertinent labs  results that were available during my care of the patient were reviewed by me and considered in my medical decision making (see chart for details). ____________________________________________  EKG  I personally interpreted any EKGs ordered by me or triage Sinus rhythm rate 90 bpm no acute ST elevation or depression normal axis unremarkable EKG nonspecific ST changes noted ____________________________________________  RADIOLOGY  Pertinent labs & imaging results that were available during my care of the patient were reviewed by me and considered in my medical decision making (see chart for details). If possible, patient and/or family made aware of any abnormal findings.  Dg Chest 2 View  Result Date: 09/13/2017 CLINICAL DATA:  Pneumonia.  Shoulder pain. EXAM: CHEST  2 VIEW COMPARISON:  09/07/2017. FINDINGS: Prior CABG. Heart size normal. No pulmonary venous congestion. Sliding hiatal hernia. No focal infiltrate. No pleural effusion or pneumothorax. IMPRESSION: 1. Prior CABG.  Heart size normal. 2. No acute pulmonary infiltrates. Interim clearing of previously identified infiltrates . 3. Stable sliding hiatal hernia. Electronically Signed   By: Marcello Moores  Register   On: 09/13/2017 15:27   ____________________________________________    PROCEDURES  Procedure(s) performed: None  Procedures  Critical Care performed:  None  ____________________________________________   INITIAL IMPRESSION / ASSESSMENT AND PLAN / ED COURSE  Pertinent labs & imaging results that were available during my care of the patient were reviewed by me and considered in my medical decision making (see chart for details).  With very reproducible shoulder pain, has been there for 6 months, constant for 1 week, initial troponin negative which is reassuring, we will give her Tylenol I would advise Tylenol and we will avoid nonsteroidals because patient does have a history of renal insufficiency.  Kidneys are slightly elevated today and creatinine function we will give a small aliquot of IV fluid.  At this time, there does not appear to be clinical evidence to support the diagnosis of pulmonary embolus, dissection, myocarditis, endocarditis, pericarditis, pericardial tamponade, acute coronary syndrome, pneumothorax, pneumonia, or any other acute intrathoracic pathology that will require admission or acute intervention. Nor is there evidence of any significant intra-abdominal pathology causing this discomfort.  If second troponin is negative is my hope we can get the patient safely home patient herself adamantly does not wish to be admitted.    ____________________________________________   FINAL CLINICAL IMPRESSION(S) / ED DIAGNOSES  Final diagnoses:  None      This chart was dictated using voice recognition software.  Despite best efforts to proofread,  errors can occur which can change meaning.      Schuyler Amor, MD 09/13/17 [phone]

## 2017-09-13 NOTE — Telephone Encounter (Signed)
Called patient's home number again. Left message on VM for patient to contact the office.

## 2017-09-13 NOTE — ED Notes (Signed)
Pt discharged to home.  Family member driving.  Discharge instructions reviewed.  Verbalized understanding.  No questions or concerns at this time.  Teach back verified.  Pt in NAD.  No items left in ED.   

## 2017-09-13 NOTE — Telephone Encounter (Signed)
Attempted to contact pt. Home number busy. Left message on VM to contact the office.

## 2017-09-13 NOTE — ED Triage Notes (Addendum)
Pt states she was seen here last week and admitted over night for pneumonia . States since she has had left shoulder pain that radiates into the left shoulder blade with SOB. States today is worse and more constant.. States she had some relief after taking 2 SL nitro this morning.

## 2017-09-13 NOTE — Telephone Encounter (Signed)
Noted. Thanks for following up with her.  

## 2017-09-13 NOTE — Telephone Encounter (Signed)
Patient is on her way to ER now.

## 2017-09-14 NOTE — Discharge Summary (Signed)
[place] [facility] [place] NAME: [name]    MR#:  [id number]  DATE OF BIRTH:  14-Oct-1925  DATE OF ADMISSION:  09/07/2017 ADMITTING PHYSICIAN: Nicholes Mango, MD  DATE OF DISCHARGE: 09/08/2017 12:27 PM  PRIMARY CARE PHYSICIAN: [name], MD   ADMISSION DIAGNOSIS:  Nausea [R11.0] Leg cramp [R25.2] Hypoxia [R09.02] Community acquired pneumonia of right lung, unspecified part of lung [J18.9]  DISCHARGE DIAGNOSIS:  Active Problems:   PNA (pneumonia)   SECONDARY DIAGNOSIS:   Past Medical History:  Diagnosis Date  . C. difficile colitis   . CAD (coronary artery disease)    a. Nuclear 10/11, not gated, no scar or ischemia, EF 68%, low risk study; b. [place] 06/18/14: no evidence of infarction or ischemia, no WMA, normalization of TWI in precordial leads, equivocal EKG changes EF 77%, low risk study  . Carotid artery disease ([place])    Doppler January, 2012, 40-59% bilateral, followup one year  . COPD (chronic obstructive pulmonary disease) ([place])   . Diffuse cystic mastopathy   . Diverticulitis    last colonoscopy  incomplete Jan 2011  . Dizziness    stable now (Oct 2011) patient tells me question of TIA, we will obtain records  . Gait abnormality    Patient complains of "wobbly gait", December, 2013  . GERD (gastroesophageal reflux disease)   . History of migraines   . Hx of CABG    a. 3 vessel in 1999  . Hyperlipidemia   . Hypertension   . Indigestion    3 weeks, October 2011  . Kidney stones   . Rheumatic fever   . Sinus bradycardia    Mild, December, 2013  . SOB (shortness of breath)   . Syncopal episodes    after 18 holes of golf and increase hydrochlorothiazide, resolved      ADMITTING HISTORY  HISTORY OF PRESENT ILLNESS:  [name]  is a 83 y.o. female with a known history of coronary artery disease, hypertension, hyperlipidemia and other medical problems is presenting to the ED with a chief  complaint of leg cramps. Renal function is worse than her baseline. Creatinine at 1.43. Chest x-ray has revealed a pneumonia. Patient is started on IV antibiotics and hospitalist team is called to admit the patient. Patient denies any chest pain but has intermittent episodes of cough     HOSPITAL COURSE:   *Community-acquired pneumonia *Mild acute kidney injury due to dehydration *Essential hypertension *History of CAD *Hyperlipidemia  Patient was admitted to medical floor.  Treated with IV antibiotics and IV fluids.  Improved well.  Her legs cramps resolved.  No hypoxia.  Patient felt back to baseline and was discharged home with oral antibiotics.  Follow-up with primary care physician in 1 week.  CONSULTS OBTAINED:    DRUG ALLERGIES:   Allergies  Allergen Reactions  . Codeine Nausea And Vomiting    DISCHARGE MEDICATIONS:   Allergies as of 09/08/2017      Reactions   Codeine Nausea And Vomiting      Medication List    TAKE these medications   acetaminophen 325 MG tablet Commonly known as:  TYLENOL Take 2 tablets (650 mg total) by mouth every 6 (six) hours as needed for mild pain (or Fever >/= 101).   ALPRAZolam 0.25 MG tablet Commonly known as:  XANAX TAKE 1-2 TABLETS BY MOUTH AT BEDTIME AS NEEDED FOR INSOMNIA   amLODipine 2.5 MG tablet Commonly known as:  NORVASC TAKE  1 TABLET BY MOUTH DAILY   aspirin 81 MG tablet Take 81 mg by mouth daily.   clopidogrel 75 MG tablet Commonly known as:  PLAVIX TAKE 1 TABLET BY MOUTH ONE TIME DAILY   docusate sodium 100 MG capsule Commonly known as:  COLACE Take 1 capsule (100 mg total) by mouth 2 (two) times daily as needed for mild constipation.   esomeprazole 40 MG capsule Commonly known as:  NEXIUM TAKE 1 CAPSULE DAILY BEFORE BREAKFAST   levofloxacin 500 MG tablet Commonly known as:  LEVAQUIN Take 1 tablet (500 mg total) by mouth daily.   losartan 100 MG tablet Commonly known as:  COZAAR TAKE 1 TABLET BY  MOUTH DAILY   nitroGLYCERIN 0.4 MG SL tablet Commonly known as:  NITROSTAT DISSOLVE 1 TABLET UNDER TONGUE EVERY FIVE MINUTES UP TO 3 DOSES AS NEEDED   polyethylene glycol packet Commonly known as:  MIRALAX / GLYCOLAX Take 17 g by mouth daily.   SHINGRIX injection Generic drug:  Zoster Vaccine Adjuvanted   simvastatin 40 MG tablet Commonly known as:  ZOCOR Take 1 tablet (40 mg total) by mouth at bedtime.       Today   VITAL SIGNS:  Blood pressure (!) 132/48, pulse 72, temperature 99 F (37.2 C), temperature source Oral, resp. rate 20, height 5\' 3"  (1.6 m), weight 61.1 kg (134 lb 12.8 oz), SpO2 95 %.  I/O:  No intake or output data in the 24 hours ending 09/14/17 1639  PHYSICAL EXAMINATION:  Physical Exam  GENERAL:  83 y.o.-year-old patient lying in the bed with no acute distress.  LUNGS: Normal breath sounds bilaterally, no wheezing, rales,rhonchi or crepitation. No use of accessory muscles of respiration.  CARDIOVASCULAR: S1, S2 normal. No murmurs, rubs, or gallops.  ABDOMEN: Soft, non-tender, non-distended. Bowel sounds present. No organomegaly or mass.  NEUROLOGIC: Moves all 4 extremities. PSYCHIATRIC: The patient is alert and oriented x 3.  SKIN: No obvious rash, lesion, or ulcer.   DATA REVIEW:   CBC Recent Labs  Lab 09/13/17 1447  WBC 6.4  HGB 13.2  HCT 38.6  PLT 171    Chemistries  Recent Labs  Lab 09/13/17 1447  NA 137  K 4.5  CL 104  CO2 25  GLUCOSE 112*  BUN 26*  CREATININE 1.67*  CALCIUM 9.1    Cardiac Enzymes Recent Labs  Lab 09/13/17 1802  TROPONINI <0.03    Microbiology Results  Results for orders placed or performed during the hospital encounter of 09/07/17  Blood culture (routine x 2)     Status: None   Collection Time: 09/07/17  1:08 PM  Result Value Ref Range Status   Specimen Description BLOOD LEFT ANTECUBITAL  Final   Special Requests   Final    BOTTLES DRAWN AEROBIC AND ANAEROBIC Blood Culture adequate volume    Culture   Final    NO GROWTH 5 DAYS Performed at [facility], [address], [place], [place] [address]    Report Status 09/12/2017 FINAL  Final  Blood culture (routine x 2)     Status: None   Collection Time: 09/07/17  1:08 PM  Result Value Ref Range Status   Specimen Description BLOOD RIGHT ANTECUBITAL  Final   Special Requests   Final    BOTTLES DRAWN AEROBIC AND ANAEROBIC Blood Culture adequate volume   Culture   Final    NO GROWTH 5 DAYS Performed at [facility], [address], [place], [place] [address]  Report Status 09/12/2017 FINAL  Final  Culture, blood (routine x 2) Call MD if unable to obtain prior to antibiotics being given     Status: None   Collection Time: 09/07/17  3:58 PM  Result Value Ref Range Status   Specimen Description BLOOD RIGHT HAND  Final   Special Requests   Final    BOTTLES DRAWN AEROBIC AND ANAEROBIC Blood Culture adequate volume   Culture   Final    NO GROWTH 5 DAYS Performed at [facility], [address], [place], [place] [address]    Report Status 09/12/2017 FINAL  Final  Culture, blood (routine x 2) Call MD if unable to obtain prior to antibiotics being given     Status: None   Collection Time: 09/07/17  3:58 PM  Result Value Ref Range Status   Specimen Description BLOOD LAC  Final   Special Requests   Final    BOTTLES DRAWN AEROBIC AND ANAEROBIC Blood Culture adequate volume   Culture   Final    NO GROWTH 5 DAYS Performed at [facility], [address] [place], [place] [address]    Report Status 09/12/2017 FINAL  Final  Culture, sputum-assessment     Status: None   Collection Time: 09/08/17 10:47 AM  Result Value Ref Range Status   Specimen Description EXPECTORATED SPUTUM  Final   Special Requests NONE  Final   Sputum evaluation   Final    Sputum specimen not acceptable for testing.  Please recollect.   Performed at [facility], [address], [place], [place] [address]     Report Status 09/08/2017 FINAL  Final    RADIOLOGY:  Dg Chest 2 View  Result Date: 09/13/2017 CLINICAL DATA:  Pneumonia.  Shoulder pain. EXAM: CHEST  2 VIEW COMPARISON:  09/07/2017. FINDINGS: Prior CABG. Heart size normal. No pulmonary venous congestion. Sliding hiatal hernia. No focal infiltrate. No pleural effusion or pneumothorax. IMPRESSION: 1. Prior CABG.  Heart size normal. 2. No acute pulmonary infiltrates. Interim clearing of previously identified infiltrates . 3. Stable sliding hiatal hernia. Electronically Signed   By: Marcello Moores  Register   On: 09/13/2017 15:27    Follow up with PCP in 1 week.  Management plans discussed with the patient, family and they are in agreement.  CODE STATUS:  Code Status History    Date Active Date Inactive Code Status Order ID Comments User Context   09/07/2017 15:42 09/08/2017 16:21 Full Code [id number]  Nicholes Mango, MD Inpatient   03/08/2017 18:46 03/10/2017 16:04 Full Code [id number]  Gladstone Lighter, MD Inpatient   12/21/2016 21:59 12/23/2016 13:42 Full Code [id number]  Gladstone Lighter, MD ED    Advance Directive Documentation     Most Recent Value  Type of Advance Directive  Healthcare Power of Attorney, Living will  Pre-existing out of facility DNR order (yellow form or pink MOST form)  No data  "MOST" Form in Place?  No data      TOTAL TIME TAKING CARE OF THIS PATIENT ON DAY OF DISCHARGE: more than 30 minutes.   Leia Alf Carlosdaniel Grob M.D on 09/14/2017 at 4:39 PM  Between 7am to 6pm - Pager - [phone]  After 6pm go to [email or web address] - password [email or web address] [place] [facility]  Office  [phone]  CC: Primary care physician; [name], MD  Note: This dictation was prepared with Dragon dictation along with smaller phrase technology. Any transcriptional errors that result from this process are unintentional.

## 2017-09-14 NOTE — Telephone Encounter (Signed)
Pt evaluated in the ED yesterday and discharged home.

## 2017-09-21 ENCOUNTER — Other Ambulatory Visit: Payer: Self-pay | Admitting: Internal Medicine

## 2017-09-21 ENCOUNTER — Encounter: Payer: Self-pay | Admitting: Internal Medicine

## 2017-09-21 ENCOUNTER — Ambulatory Visit (INDEPENDENT_AMBULATORY_CARE_PROVIDER_SITE_OTHER): Payer: Medicare Other | Admitting: Internal Medicine

## 2017-09-21 VITALS — BP 108/62 | HR 60 | Temp 97.6°F | Resp 15 | Ht 63.0 in | Wt 132.4 lb

## 2017-09-21 DIAGNOSIS — N179 Acute kidney failure, unspecified: Secondary | ICD-10-CM

## 2017-09-21 DIAGNOSIS — J690 Pneumonitis due to inhalation of food and vomit: Secondary | ICD-10-CM | POA: Diagnosis not present

## 2017-09-21 DIAGNOSIS — F419 Anxiety disorder, unspecified: Secondary | ICD-10-CM | POA: Diagnosis not present

## 2017-09-21 DIAGNOSIS — F5105 Insomnia due to other mental disorder: Secondary | ICD-10-CM | POA: Diagnosis not present

## 2017-09-21 LAB — BASIC METABOLIC PANEL
BUN: 26 mg/dL — ABNORMAL HIGH (ref 6–23)
CALCIUM: 8.9 mg/dL (ref 8.4–10.5)
CO2: 29 meq/L (ref 19–32)
CREATININE: 1.31 mg/dL — AB (ref 0.40–1.20)
Chloride: 105 mEq/L (ref 96–112)
GFR: 40.62 mL/min — ABNORMAL LOW (ref >60.00)
GLUCOSE: 95 mg/dL (ref 70–99)
Potassium: 4.9 mEq/L (ref 3.5–5.1)
Sodium: 141 mEq/L (ref 135–145)

## 2017-09-21 MED ORDER — IPRATROPIUM-ALBUTEROL 20-100 MCG/ACT IN AERS: 1.0000 | INHALATION_SPRAY | Freq: Four times a day (QID) | RESPIRATORY_TRACT | 10 refills | Status: DC

## 2017-09-21 MED ORDER — LORAZEPAM 0.5 MG PO TABS: 0.5000 mg | ORAL_TABLET | Freq: Every day | ORAL | 5 refills | Status: DC

## 2017-09-21 NOTE — Progress Notes (Signed)
Subjective:  Patient ID: [name], female    DOB: 1925-10-15  Age: 84 y.o. MRN: [id number]  CC: The primary encounter diagnosis was Acute renal failure, unspecified acute renal failure type ([place]). Diagnoses of Insomnia secondary to anxiety and Aspiration pneumonia of right lower lobe due to gastric secretions [facility]) were also pertinent to this visit.  HPI [name] presents for follow up on multiple ER visits  1) Dec 20th : Nausea:  Admitted with RML/RLL pneumonia Janean Sark home on Dec 21 with oral levaquin   2) Dec 26:  LEFT SIDED BACK PAIN .secondary to persistent cough   1 week after   Not sleeping well despite using 0.5 mg alprazolam . Wide awake,  excessive worrying.  Only lasts about 4 hours,    Outpatient Medications Prior to Visit  Medication Sig Dispense Refill  . aspirin 81 MG tablet Take 81 mg by mouth daily.      . clopidogrel (PLAVIX) 75 MG tablet TAKE 1 TABLET BY MOUTH ONE TIME DAILY 90 tablet 2  . esomeprazole (NEXIUM) 40 MG capsule TAKE 1 CAPSULE DAILY BEFORE BREAKFAST 90 capsule 1  . losartan (COZAAR) 100 MG tablet TAKE 1 TABLET BY MOUTH DAILY 90 tablet 2  . nitroGLYCERIN (NITROSTAT) 0.4 MG SL tablet DISSOLVE 1 TABLET UNDER TONGUE EVERY FIVE MINUTES UP TO 3 DOSES AS NEEDED 25 tablet 0  . simvastatin (ZOCOR) 40 MG tablet Take 1 tablet (40 mg total) by mouth at bedtime. 90 tablet 3  . ALPRAZolam (XANAX) 0.25 MG tablet TAKE 1-2 TABLETS BY MOUTH AT BEDTIME AS NEEDED FOR INSOMNIA 60 tablet 0  . amLODipine (NORVASC) 2.5 MG tablet TAKE 1 TABLET BY MOUTH DAILY 90 tablet 0  . Ipratropium-Albuterol (COMBIVENT RESPIMAT) 20-100 MCG/ACT AERS respimat Inhale 1 puff into the lungs every 6 (six) hours. 1 Inhaler 10  . acetaminophen (TYLENOL) 325 MG tablet Take 2 tablets (650 mg total) by mouth every 6 (six) hours as needed for mild pain (or Fever >/= 101). (Patient not taking: Reported on 09/21/2017)    . docusate sodium (COLACE) 100 MG capsule Take 1 capsule  (100 mg total) by mouth 2 (two) times daily as needed for mild constipation. (Patient not taking: Reported on 09/21/2017) 10 capsule 0  . levofloxacin (LEVAQUIN) 500 MG tablet Take 1 tablet (500 mg total) by mouth daily. (Patient not taking: Reported on 09/21/2017) 5 tablet 0  . polyethylene glycol (MIRALAX / GLYCOLAX) packet Take 17 g by mouth daily. (Patient not taking: Reported on 09/21/2017) 14 each 0  . SHINGRIX injection      No facility-administered medications prior to visit.     Review of Systems;  Patient denies headache, fevers, malaise, unintentional weight loss, skin rash, eye pain, sinus congestion and sinus pain, sore throat, dysphagia,  hemoptysis , cough, dyspnea, wheezing, chest pain, palpitations, orthopnea, edema, abdominal pain, nausea, melena, diarrhea, constipation, flank pain, dysuria, hematuria, urinary  Frequency, nocturia, numbness, tingling, seizures,  Focal weakness, Loss of consciousness,  Tremor, insomnia, depression, anxiety, and suicidal ideation.      Objective:  BP 108/62 (BP Location: Left Arm, Patient Position: Sitting, Cuff Size: Normal)   Pulse 60   Temp 97.6 F (36.4 C) (Oral)   Resp 15   Ht 5\' 3"  (1.6 m)   Wt 132 lb 6.4 oz (60.1 kg)   SpO2 94%   BMI 23.45 kg/m   BP Readings from Last 3 Encounters:  09/21/17 108/62  09/13/17 (!) 150/73  09/08/17 Marland Kitchen)  132/48    Wt Readings from Last 3 Encounters:  09/21/17 132 lb 6.4 oz (60.1 kg)  09/13/17 130 lb (59 kg)  09/07/17 134 lb 12.8 oz (61.1 kg)    General appearance: alert, cooperative and appears stated age Ears: normal TM's and external ear canals both ears Throat: lips, mucosa, and tongue normal; teeth and gums normal Neck: no adenopathy, no carotid bruit, supple, symmetrical, trachea midline and thyroid not enlarged, symmetric, no tenderness/mass/nodules Back: symmetric, no curvature. ROM normal. No CVA tenderness. Lungs: clear to auscultation bilaterally Heart: regular rate and rhythm, S1, S2  normal, no murmur, click, rub or gallop Abdomen: soft, non-tender; bowel sounds normal; no masses,  no organomegaly Pulses: 2+ and symmetric Skin: Skin color, texture, turgor normal. No rashes or lesions Lymph nodes: Cervical, supraclavicular, and axillary nodes normal.  Lab Results  Component Value Date   HGBA1C 5.8 01/30/2017   HGBA1C 5.8 09/26/2016    Lab Results  Component Value Date   CREATININE 1.31 (H) 09/21/2017   CREATININE 1.67 (H) 09/13/2017   CREATININE 1.23 (H) 09/08/2017    Lab Results  Component Value Date   WBC 6.4 09/13/2017   HGB 13.2 09/13/2017   HCT 38.6 09/13/2017   PLT 171 09/13/2017   GLUCOSE 95 09/21/2017   CHOL 109 12/08/2016   TRIG 122.0 12/08/2016   HDL 31.30 (L) 12/08/2016   LDLDIRECT 72.0 11/09/2015   LDLCALC 53 12/08/2016   ALT 16 09/07/2017   AST 28 09/07/2017   NA 141 09/21/2017   K 4.9 09/21/2017   CL 105 09/21/2017   CREATININE 1.31 (H) 09/21/2017   BUN 26 (H) 09/21/2017   CO2 29 09/21/2017   TSH 1.44 02/09/2015   INR 1.0 06/17/2014   HGBA1C 5.8 01/30/2017   MICROALBUR 2.5 (H) 12/26/2011    Dg Chest 2 View  Result Date: 09/13/2017 CLINICAL DATA:  Pneumonia.  Shoulder pain. EXAM: CHEST  2 VIEW COMPARISON:  09/07/2017. FINDINGS: Prior CABG. Heart size normal. No pulmonary venous congestion. Sliding hiatal hernia. No focal infiltrate. No pleural effusion or pneumothorax. IMPRESSION: 1. Prior CABG.  Heart size normal. 2. No acute pulmonary infiltrates. Interim clearing of previously identified infiltrates . 3. Stable sliding hiatal hernia. Electronically Signed   By: Marcello Moores  Register   On: 09/13/2017 15:27    Assessment & Plan:   Problem List Items Addressed This Visit    Acute renal failure (ARF) ([place]) - Primary    GFR was down during most recent ER visit but has improved since discharge.  Encouraged to drink  60 ounces of water daily .  Lab Results  Component Value Date   CREATININE 1.31 (H) 09/21/2017         Relevant  Orders   Basic metabolic panel (Completed)   Insomnia secondary to anxiety    She has had decreased sleep with 0.5 mg alprazolam with early wakenings nightly.  Trial of lorazepam . The risks and benefits of benzodiazepine use were discussed with patient today including excessive sedation leading to respiratory depression,  impaired thinking/driving, and addiction.  Patient was advised to avoid concurrent use with alcohol, to use medication only as needed and not to share with others  .       Relevant Medications   LORazepam (ATIVAN) 0.5 MG tablet   PNA (pneumonia)    Clinically resolved..  Chest x ray had cleared one week later. No need for repeat .  She continues tot take daily probiotics  Relevant Medications   Ipratropium-Albuterol (COMBIVENT RESPIMAT) 20-100 MCG/ACT AERS respimat      I have discontinued [name]'s acetaminophen, docusate sodium, polyethylene glycol, SHINGRIX, ALPRAZolam, and levofloxacin. I am also having her start on LORazepam. Additionally, I am having her maintain her aspirin, esomeprazole, simvastatin, clopidogrel, losartan, nitroGLYCERIN, and Ipratropium-Albuterol.  Meds ordered this encounter  Medications  . Ipratropium-Albuterol (COMBIVENT RESPIMAT) 20-100 MCG/ACT AERS respimat    Sig: Inhale 1 puff into the lungs every 6 (six) hours.    Dispense:  1 Inhaler    Refill:  10  . LORazepam (ATIVAN) 0.5 MG tablet    Sig: Take 1 tablet (0.5 mg total) by mouth at bedtime. As needed for insomnia    Dispense:  30 tablet    Refill:  5   A total of 25 minutes of face to face time was spent with patient more than half of which was spent in counselling about the above mentioned conditions  and coordination of care  Medications Discontinued During This Encounter  Medication Reason  . acetaminophen (TYLENOL) 325 MG tablet Patient has not taken in last 30 days  . docusate sodium (COLACE) 100 MG capsule Patient has not taken in last 30 days  .  levofloxacin (LEVAQUIN) 500 MG tablet Completed Course  . polyethylene glycol (MIRALAX / GLYCOLAX) packet Patient has not taken in last 30 days  . SHINGRIX injection Patient has not taken in last 30 days  . Ipratropium-Albuterol (COMBIVENT RESPIMAT) 20-100 MCG/ACT AERS respimat Reorder  . ALPRAZolam (XANAX) 0.25 MG tablet     Follow-up: No Follow-up on file.   [name], MD

## 2017-09-21 NOTE — Patient Instructions (Signed)
Your repeat chest x ray was normal on Dec 26th.  The pneumonia was caused by aspiration when you vomited,  And is now resolved   Your kidney function was LOW so we are repeatingit today  Try to drink 40 ounces of water (minimum ) daily

## 2017-09-23 DIAGNOSIS — N179 Acute kidney failure, unspecified: Secondary | ICD-10-CM | POA: Insufficient documentation

## 2017-09-23 NOTE — Assessment & Plan Note (Addendum)
Clinically resolved..  Chest x ray had cleared one week later. No need for repeat .  She continues tot take daily probiotics

## 2017-09-23 NOTE — Assessment & Plan Note (Signed)
GFR was down during most recent ER visit but has improved since discharge.  Encouraged to drink  60 ounces of water daily .  Lab Results  Component Value Date   CREATININE 1.31 (H) 09/21/2017

## 2017-09-23 NOTE — Assessment & Plan Note (Signed)
She has had decreased sleep with 0.5 mg alprazolam with early wakenings nightly.  Trial of lorazepam . The risks and benefits of benzodiazepine use were discussed with patient today including excessive sedation leading to respiratory depression,  impaired thinking/driving, and addiction.  Patient was advised to avoid concurrent use with alcohol, to use medication only as needed and not to share with others  .

## 2017-10-03 DIAGNOSIS — H43813 Vitreous degeneration, bilateral: Secondary | ICD-10-CM | POA: Diagnosis not present

## 2017-11-06 ENCOUNTER — Other Ambulatory Visit: Payer: Self-pay | Admitting: Cardiovascular Disease

## 2017-11-09 ENCOUNTER — Ambulatory Visit: Payer: Medicare Other

## 2017-11-13 ENCOUNTER — Telehealth: Payer: Self-pay | Admitting: *Deleted

## 2017-11-13 NOTE — Telephone Encounter (Signed)
Copied from [place] [id number]. Topic: Appointment Scheduling - Scheduling Inquiry for Clinic >> Nov 13, 2017 12:04 PM Percell Belt A wrote: Reason for CRM: pt called in and is requesting to get in to see Dr Derrel Nip before her next available with is March 26th.  Can you please give her a call if there Is something sooner   Best number [phone]

## 2017-11-14 NOTE — Telephone Encounter (Signed)
Attempted to call pt several times. Number was busy. Will try to call pt back in a few.

## 2017-11-14 NOTE — Telephone Encounter (Signed)
Had pneumonia before [place] feels  fatigued, coughing a little,  no fever.   Crying all the time, no thoughts of hurting herself or others, "doesn't feel depressed "   She states she just feels like  can't get it together" Taking lorazepam Patient thinks she got overwhelmed after fall back in Summer then after Christmas fatigued crying all the time  .

## 2017-11-14 NOTE — Telephone Encounter (Signed)
Spoke with pt and she stated that she is fine and nothing is an emergency and that she is okay with waiting to see Dr. Derrel Nip next week. Pt has been scheduled for next Tuesday with Dr. Derrel Nip. Pt is aware of appt date and time.

## 2017-11-14 NOTE — Telephone Encounter (Signed)
LMTCB. Please transfer pt to our office.  

## 2017-11-15 DIAGNOSIS — Z85828 Personal history of other malignant neoplasm of skin: Secondary | ICD-10-CM | POA: Diagnosis not present

## 2017-11-15 DIAGNOSIS — C44722 Squamous cell carcinoma of skin of right lower limb, including hip: Secondary | ICD-10-CM | POA: Diagnosis not present

## 2017-11-15 DIAGNOSIS — Z08 Encounter for follow-up examination after completed treatment for malignant neoplasm: Secondary | ICD-10-CM | POA: Diagnosis not present

## 2017-11-15 DIAGNOSIS — X32XXXA Exposure to sunlight, initial encounter: Secondary | ICD-10-CM | POA: Diagnosis not present

## 2017-11-15 DIAGNOSIS — L570 Actinic keratosis: Secondary | ICD-10-CM | POA: Diagnosis not present

## 2017-11-15 DIAGNOSIS — D485 Neoplasm of uncertain behavior of skin: Secondary | ICD-10-CM | POA: Diagnosis not present

## 2017-11-21 ENCOUNTER — Ambulatory Visit (INDEPENDENT_AMBULATORY_CARE_PROVIDER_SITE_OTHER): Payer: Medicare Other | Admitting: Internal Medicine

## 2017-11-21 ENCOUNTER — Encounter: Payer: Self-pay | Admitting: Internal Medicine

## 2017-11-21 VITALS — BP 130/62 | HR 66 | Temp 98.1°F | Resp 14 | Ht 63.0 in | Wt 134.8 lb

## 2017-11-21 DIAGNOSIS — R8290 Unspecified abnormal findings in urine: Secondary | ICD-10-CM

## 2017-11-21 DIAGNOSIS — N179 Acute kidney failure, unspecified: Secondary | ICD-10-CM | POA: Diagnosis not present

## 2017-11-21 DIAGNOSIS — F411 Generalized anxiety disorder: Secondary | ICD-10-CM

## 2017-11-21 MED ORDER — ESCITALOPRAM OXALATE 10 MG PO TABS: 10.0000 mg | ORAL_TABLET | Freq: Every day | ORAL | 2 refills | Status: DC

## 2017-11-21 NOTE — Patient Instructions (Signed)
Trial of lexapro.  Please start the Lexapro (escitalopram) at 1/2 tablet daily after breakfast for the first week to avoid nausea.  Increase to full tablet after one week   Reduce the lorazepam to 1/2 tablet at bedtime.  If you need to take the second half,  You can  If the lexapro  Makes you drowsy ,  You can take  it in the evening  instead  Please return in  4 weeks

## 2017-11-21 NOTE — Progress Notes (Signed)
Subjective:  Patient ID: [name], female    DOB: 03-09-1926  Age: 84 y.o. MRN: [id number]  CC: The primary encounter diagnosis was Abnormal urine odor. Diagnoses of Generalized anxiety disorder and Acute renal failure, unspecified acute renal failure type [facility]) were also pertinent to this visit.  HPI [name] presents for evaluation of unexplained mood lability.  For the past several weeks has been crying " during movies,  Commercials,  anything sentimental.  Denies all other symptoms of depression,   Not anxious.   Not socially isolated. Wonders if the recent physical stressors may have contributed:   Had a fall in June pelvic fractures,  Shoulder fractures  Pneumonia before christmas .  Then Entertained 24 guests at house for Christmas ,  5 were there for a week.  Went back to hospitlal  after Christmas due to chest pain .  Denies headaches,   Some vision changes requiring glasses.  Intermittent nausea , mild occurring more than once a week .  Not weight loss  Discussed lexapro trial.    2) persistent vaginal odor. No discharge. No burning . Notices it only when urinating .  Wears a pad due to fear of incontinence  Outpatient Medications Prior to Visit  Medication Sig Dispense Refill  . amLODipine (NORVASC) 2.5 MG tablet TAKE 1 TABLET BY MOUTH DAILY 90 tablet 1  . aspirin 81 MG tablet Take 81 mg by mouth daily.      . clopidogrel (PLAVIX) 75 MG tablet TAKE 1 TABLET BY MOUTH ONE TIME DAILY 90 tablet 2  . esomeprazole (NEXIUM) 40 MG capsule TAKE 1 CAPSULE DAILY BEFORE BREAKFAST 90 capsule 1  . Ipratropium-Albuterol (COMBIVENT RESPIMAT) 20-100 MCG/ACT AERS respimat Inhale 1 puff into the lungs every 6 (six) hours. 1 Inhaler 10  . LORazepam (ATIVAN) 0.5 MG tablet Take 1 tablet (0.5 mg total) by mouth at bedtime. As needed for insomnia 30 tablet 5  . losartan (COZAAR) 100 MG tablet TAKE 1 TABLET BY MOUTH DAILY 90 tablet 2  . nitroGLYCERIN (NITROSTAT) 0.4 MG SL tablet  DISSOLVE 1 TABLET UNDER TONGUE EVERY FIVE MINUTES UP TO 3 DOSES AS NEEDED 25 tablet 0  . simvastatin (ZOCOR) 40 MG tablet TAKE ONE TABLET AT BEDTIME 90 tablet 3   No facility-administered medications prior to visit.     Review of Systems;  Patient denies headache, fevers, malaise, unintentional weight loss, skin rash, eye pain, sinus congestion and sinus pain, sore throat, dysphagia,  hemoptysis , cough, dyspnea, wheezing, chest pain, palpitations, orthopnea, edema, abdominal pain, nausea, melena, diarrhea, constipation, flank pain, dysuria, hematuria, urinary  Frequency, nocturia, numbness, tingling, seizures,  Focal weakness, Loss of consciousness,  Tremor, insomnia, depression, anxiety, and suicidal ideation.      Objective:  BP 130/62 (BP Location: Left Arm, Patient Position: Sitting, Cuff Size: Normal)   Pulse 66   Temp 98.1 F (36.7 C) (Oral)   Resp 14   Ht 5\' 3"  (1.6 m)   Wt 134 lb 12.8 oz (61.1 kg)   SpO2 98%   BMI 23.88 kg/m   BP Readings from Last 3 Encounters:  11/21/17 130/62  09/21/17 108/62  09/13/17 (!) 150/73    Wt Readings from Last 3 Encounters:  11/21/17 134 lb 12.8 oz (61.1 kg)  09/21/17 132 lb 6.4 oz (60.1 kg)  09/13/17 130 lb (59 kg)    General appearance: alert, cooperative and appears stated age Ears: normal TM's and external ear canals both ears Throat: lips, mucosa, and  tongue normal; teeth and gums normal Neck: no adenopathy, no carotid bruit, supple, symmetrical, trachea midline and thyroid not enlarged, symmetric, no tenderness/mass/nodules Back: symmetric, no curvature. ROM normal. No CVA tenderness. Lungs: clear to auscultation bilaterally Heart: regular rate and rhythm, S1, S2 normal, no murmur, click, rub or gallop Abdomen: soft, non-tender; bowel sounds normal; no masses,  no organomegaly Pulses: 2+ and symmetric Skin: Skin color, texture, turgor normal. No rashes or lesions Lymph nodes: Cervical, supraclavicular, and axillary nodes  normal.  Lab Results  Component Value Date   HGBA1C 5.8 01/30/2017   HGBA1C 5.8 09/26/2016    Lab Results  Component Value Date   CREATININE 1.31 (H) 09/21/2017   CREATININE 1.67 (H) 09/13/2017   CREATININE 1.23 (H) 09/08/2017    Lab Results  Component Value Date   WBC 6.4 09/13/2017   HGB 13.2 09/13/2017   HCT 38.6 09/13/2017   PLT 171 09/13/2017   GLUCOSE 95 09/21/2017   CHOL 109 12/08/2016   TRIG 122.0 12/08/2016   HDL 31.30 (L) 12/08/2016   LDLDIRECT 72.0 11/09/2015   LDLCALC 53 12/08/2016   ALT 16 09/07/2017   AST 28 09/07/2017   NA 141 09/21/2017   K 4.9 09/21/2017   CL 105 09/21/2017   CREATININE 1.31 (H) 09/21/2017   BUN 26 (H) 09/21/2017   CO2 29 09/21/2017   TSH 1.44 02/09/2015   INR 1.0 06/17/2014   HGBA1C 5.8 01/30/2017   MICROALBUR 2.5 (H) 12/26/2011    Dg Chest 2 View  Result Date: 09/13/2017 CLINICAL DATA:  Pneumonia.  Shoulder pain. EXAM: CHEST  2 VIEW COMPARISON:  09/07/2017. FINDINGS: Prior CABG. Heart size normal. No pulmonary venous congestion. Sliding hiatal hernia. No focal infiltrate. No pleural effusion or pneumothorax. IMPRESSION: 1. Prior CABG.  Heart size normal. 2. No acute pulmonary infiltrates. Interim clearing of previously identified infiltrates . 3. Stable sliding hiatal hernia. Electronically Signed   By: Marcello Moores  Register   On: 09/13/2017 15:27    Assessment & Plan:   Problem List Items Addressed This Visit    Abnormal urine odor - Primary    Awaiting urinalysis . If negative for infection ,  Will need pelvic exam       Relevant Orders   Urinalysis, Routine w reflex microscopic   Urine Culture   RESOLVED: Acute renal failure (ARF) ([facility])    GFR was down during most recent ER visit but has improved since discharge.  Encouraged to drink  60 ounces of water daily .  Lab Results  Component Value Date   CREATININE 1.31 (H) 09/21/2017         Anxiety disorder    Manifested as mood lability with frequent tearfulness.  Trial  of lexapro.  rtc 4 weeks      Relevant Medications   escitalopram (LEXAPRO) 10 MG tablet    A total of 25 minutes of face to face time was spent with patient more than half of which was spent in counselling about the above mentioned conditions  and coordination of care   I am having [name] start on escitalopram. I am also having her maintain her aspirin, esomeprazole, clopidogrel, losartan, nitroGLYCERIN, Ipratropium-Albuterol, LORazepam, amLODipine, and simvastatin.  Meds ordered this encounter  Medications  . escitalopram (LEXAPRO) 10 MG tablet    Sig: Take 1 tablet (10 mg total) by mouth daily.    Dispense:  30 tablet    Refill:  2    There are no discontinued medications.  Follow-up: Return  for DEPRESSION.   [name], MD

## 2017-11-22 ENCOUNTER — Other Ambulatory Visit (INDEPENDENT_AMBULATORY_CARE_PROVIDER_SITE_OTHER): Payer: Medicare Other

## 2017-11-22 DIAGNOSIS — R8290 Unspecified abnormal findings in urine: Secondary | ICD-10-CM

## 2017-11-22 NOTE — Assessment & Plan Note (Addendum)
Awaiting urinalysis . If negative for infection ,  Will need pelvic exam

## 2017-11-22 NOTE — Assessment & Plan Note (Signed)
GFR was down during most recent ER visit but has improved since discharge.  Encouraged to drink  60 ounces of water daily .  Lab Results  Component Value Date   CREATININE 1.31 (H) 09/21/2017

## 2017-11-22 NOTE — Assessment & Plan Note (Addendum)
Manifested as mood lability with frequent tearfulness.  Trial of lexapro.  rtc 4 weeks

## 2017-11-23 LAB — URINALYSIS, ROUTINE W REFLEX MICROSCOPIC
Bilirubin Urine: NEGATIVE
HGB URINE DIPSTICK: NEGATIVE
Ketones, ur: NEGATIVE
NITRITE: NEGATIVE
RBC / HPF: NONE SEEN (ref 0–?)
SPECIFIC GRAVITY, URINE: 1.025 (ref 1.000–1.030)
Urine Glucose: NEGATIVE
Urobilinogen, UA: 0.2 (ref 0.0–1.0)
pH: 6 (ref 5.0–8.0)

## 2017-11-23 LAB — URINE CULTURE
MICRO NUMBER: 90288201
SPECIMEN QUALITY:: ADEQUATE

## 2017-12-07 ENCOUNTER — Telehealth: Payer: Self-pay | Admitting: Cardiovascular Disease

## 2017-12-07 DIAGNOSIS — C44722 Squamous cell carcinoma of skin of right lower limb, including hip: Secondary | ICD-10-CM | POA: Diagnosis not present

## 2017-12-07 DIAGNOSIS — L570 Actinic keratosis: Secondary | ICD-10-CM | POA: Diagnosis not present

## 2017-12-07 DIAGNOSIS — X32XXXA Exposure to sunlight, initial encounter: Secondary | ICD-10-CM | POA: Diagnosis not present

## 2017-12-07 NOTE — Telephone Encounter (Signed)
Called patient.  States she's been having some increased shortness of breath with activity over the past couple weeks. She has some ankle swelling as well. She is wearing compression stockings for this and it helps some. Encouraged patient to continue with compression stockings and elevation. Patient wants to see Dr Rockey Situ. Scheduled her for April 1 at 10:20 am. Advised patient to call us if her symptoms worsen and she verbalized understanding.

## 2017-12-07 NOTE — Telephone Encounter (Signed)
Pt c/o Shortness Of Breath: STAT if SOB developed within the last 24 hours or pt is noticeably SOB on the phone  1. Are you currently SOB (can you hear that pt is SOB on the phone)? yes  2. How long have you been experiencing SOB? Couple weeks  3. Are you SOB when sitting or when up moving around? Up moving around  4. Are you currently experiencing any other symptoms? none

## 2017-12-13 ENCOUNTER — Ambulatory Visit: Payer: Medicare Other | Admitting: Internal Medicine

## 2017-12-16 ENCOUNTER — Other Ambulatory Visit: Payer: Self-pay | Admitting: Internal Medicine

## 2017-12-16 NOTE — Progress Notes (Signed)
Cardiology Office Note  Date:  12/18/2017   ID:  [name], DOB 1926-07-31, MRN [id number]  PCP:  [name], MD   Chief Complaint  Patient presents with  . Other    Early follow up. Patient c/o SOB. Meds reviewed verbally with patient.     HPI:  [name] is a pleasant 84 year old woman with a history of  CAD, bypass surgery in 1999,Stress test October 2017 with no ischemia COPD, Smoking for 40 years who quit in 1985,  chronic renal insufficiency,  hypertension,  hyperlipidemia,  40-50% bilateral carotid arterial disease  EF >60% in 05/2015  C. difficile in January 2015. H/o fall wet floor  02/2017, Suffered a pelvic fracture, left shoulder pain who presents for routine followup of her coronary artery disease  In follow-up today she reports that she had pneumonia December 2018 after 20 or more of her family came to her house for Christmas Difficult time recovering, fatigue, chronic shortness of breath Feels that her breathing is worse than normal, wants to make sure it is not from blockages  SOB with steps to get to bed Does not feel herself  No regular exercise program  Avid golfer at baseline but has not been playing much golf  Lab work reviewed with her, LDL 72 HBA1C 5.8  EKG personally reviewed by myself on todays visit Shows sinus bradycardia rate 65 bpm nonspecific T wave abnormality  Of the past medical history reviewed Hospital admission 12/21/2016 for COPD exacerbation Syncope, dehydrated She received steroids,duo nebs, ABX  On previous office visits, reported having left-sided chest pain and shoulder pain Declined stress testing or catheterization  at that time.  On a previous clinic visit had chest pain, took aspirin and nitroglycerin, after several hours symptoms resolved.   She has not had any intervention since her bypass surgery. No cardiac catheterizations, no stent placement.  C. difficile in January 2015. She was in the hospital for  several days.  Last stress test was October 2011 and September 2015 , 2017 Carotid ultrasound showing 40-59% disease   PMH:   has a past medical history of C. difficile colitis, CAD (coronary artery disease), Carotid artery disease ([place]), COPD (chronic obstructive pulmonary disease) ([place]), Diffuse cystic mastopathy, Diverticulitis, Dizziness, Gait abnormality, GERD (gastroesophageal reflux disease), History of migraines, CABG, Hyperlipidemia, Hypertension, Indigestion, Kidney stones, Rheumatic fever, Sinus bradycardia, SOB (shortness of breath), and Syncopal episodes.  PSH:    Past Surgical History:  Procedure Laterality Date  . APPENDECTOMY  1950  . BREAST BIOPSY Right 1994  . BREAST CYST ASPIRATION     multiple BIL  . CATARACT EXTRACTION Right 2009  . CATARACT EXTRACTION Left 2011  . COLONOSCOPY  [id number],[id number]   Dr. Jamal Collin  . CORONARY ARTERY BYPASS GRAFT  1999  . esophagus stretched    . TONSILLECTOMY  1939  . TOTAL ABDOMINAL HYSTERECTOMY  1968   due to metrorrhagia    Current Outpatient Medications  Medication Sig Dispense Refill  . amLODipine (NORVASC) 2.5 MG tablet TAKE 1 TABLET BY MOUTH DAILY 90 tablet 1  . aspirin 81 MG tablet Take 81 mg by mouth daily.      . clopidogrel (PLAVIX) 75 MG tablet TAKE 1 TABLET BY MOUTH ONE TIME DAILY 90 tablet 2  . escitalopram (LEXAPRO) 10 MG tablet Take 1 tablet (10 mg total) by mouth daily. 30 tablet 2  . esomeprazole (NEXIUM) 40 MG capsule TAKE 1 CAPSULE DAILY BEFORE BREAKFAST 90 capsule 1  . Ipratropium-Albuterol (COMBIVENT RESPIMAT)  20-100 MCG/ACT AERS respimat Inhale 1 puff into the lungs every 6 (six) hours. 1 Inhaler 10  . LORazepam (ATIVAN) 0.5 MG tablet Take 1 tablet (0.5 mg total) by mouth at bedtime. As needed for insomnia 30 tablet 5  . losartan (COZAAR) 100 MG tablet TAKE 1 TABLET BY MOUTH DAILY 90 tablet 2  . nitroGLYCERIN (NITROSTAT) 0.4 MG SL tablet DISSOLVE 1 TABLET UNDER TONGUE EVERY FIVE MINUTES UP TO 3 DOSES AS NEEDED 25  tablet 0  . simvastatin (ZOCOR) 40 MG tablet TAKE ONE TABLET AT BEDTIME 90 tablet 3   No current facility-administered medications for this visit.      Allergies:   Codeine   Social History:  The patient  reports that she quit smoking about 34 years ago. Her smoking use included cigarettes. She has a 20.00 pack-year smoking history. She has never used smokeless tobacco. She reports that she drinks alcohol. She reports that she does not use drugs.   Family History:   family history includes Heart disease in her father and mother.    Review of Systems: Review of Systems  Constitutional: Negative.   Respiratory: Positive for shortness of breath.   Cardiovascular: Negative.   Gastrointestinal: Negative.   Musculoskeletal: Positive for joint pain.  Psychiatric/Behavioral: Negative.   All other systems reviewed and are negative.    PHYSICAL EXAM: VS:  BP 130/66 (BP Location: Left Arm, Patient Position: Sitting, Cuff Size: Normal)   Pulse 65   Ht 5' 3.5" (1.613 m)   Wt 132 lb (59.9 kg)   BMI 23.02 kg/m  , BMI Body mass index is 23.02 kg/m. Constitutional:  oriented to person, place, and time. No distress.  HENT:  Head: Normocephalic and atraumatic.  Eyes:  no discharge. No scleral icterus.  Neck: Normal range of motion. Neck supple. No JVD present.  Cardiovascular: Normal rate, regular rhythm, normal heart sounds and intact distal pulses. Exam reveals no gallop and no friction rub. No edema No murmur heard. Pulmonary/Chest: Effort normal and breath sounds normal. No stridor. No respiratory distress.  no wheezes.  no rales.  no tenderness.  Abdominal: Soft.  no distension.  no tenderness.  Musculoskeletal: Normal range of motion.  no  tenderness or deformity.  Neurological:  normal muscle tone. Coordination normal. No atrophy Skin: Skin is warm and dry. No rash noted. not diaphoretic.  Psychiatric:  normal mood and affect. behavior is normal. Thought content normal.    Recent  Labs: 12/21/2016: Magnesium 1.9 09/07/2017: ALT 16 09/13/2017: Hemoglobin 13.2; Platelets 171 09/21/2017: BUN 26; Creatinine, Ser 1.31; Potassium 4.9; Sodium 141    Lipid Panel Lab Results  Component Value Date   CHOL 109 12/08/2016   HDL 31.30 (L) 12/08/2016   LDLCALC 53 12/08/2016   TRIG 122.0 12/08/2016      Wt Readings from Last 3 Encounters:  12/18/17 132 lb (59.9 kg)  11/21/17 134 lb 12.8 oz (61.1 kg)  09/21/17 132 lb 6.4 oz (60.1 kg)     ASSESSMENT AND PLAN:   Essential hypertension -  Blood pressure is well controlled on today's visit. No changes made to the medications. Stable  Coronary artery disease involving native coronary artery of native heart without angina pectoris -  Previous stress test October 2017 showing no ischemia She is having worsening shortness of breath concerning for unstable angina Long discussion with her, we will order pharmacologic Myoview she is unable to treadmill secondary to joint pain  History of bypass surgery Stress test as above for  worsening shortness of breath  Pneumonia Recovered from pneumonia December 2018 but still short of breath  Carotid stenosis Less than 39% bilaterally, significant plaque noted  Hyperlipidemia Cholesterol is at goal on the current lipid regimen. No changes to the medications were made.  Stable    Total encounter time more than 25 minutes  Greater than 50% was spent in counseling and coordination of care with the patient   Disposition:   F/U  6 months   Orders Placed This Encounter  Procedures  . NM Myocar Multi W/Spect W/Wall Motion / EF  . EKG 12-Lead     Signed, Esmond Plants, M.D., Ph.D. 12/18/2017  [place], [place]

## 2017-12-18 ENCOUNTER — Ambulatory Visit (INDEPENDENT_AMBULATORY_CARE_PROVIDER_SITE_OTHER): Payer: Medicare Other | Admitting: Cardiovascular Disease

## 2017-12-18 ENCOUNTER — Ambulatory Visit: Payer: Medicare Other | Admitting: Cardiovascular Disease

## 2017-12-18 ENCOUNTER — Encounter: Payer: Self-pay | Admitting: Cardiovascular Disease

## 2017-12-18 ENCOUNTER — Other Ambulatory Visit: Payer: Self-pay | Admitting: Internal Medicine

## 2017-12-18 VITALS — BP 130/66 | HR 65 | Ht 63.5 in | Wt 132.0 lb

## 2017-12-18 DIAGNOSIS — I700 Atherosclerosis of aorta: Secondary | ICD-10-CM | POA: Diagnosis not present

## 2017-12-18 DIAGNOSIS — N183 Chronic kidney disease, stage 3 unspecified: Secondary | ICD-10-CM

## 2017-12-18 DIAGNOSIS — W19XXXS Unspecified fall, sequela: Secondary | ICD-10-CM | POA: Diagnosis not present

## 2017-12-18 DIAGNOSIS — E782 Mixed hyperlipidemia: Secondary | ICD-10-CM

## 2017-12-18 DIAGNOSIS — I10 Essential (primary) hypertension: Secondary | ICD-10-CM

## 2017-12-18 DIAGNOSIS — I208 Other forms of angina pectoris: Secondary | ICD-10-CM

## 2017-12-18 DIAGNOSIS — Z1231 Encounter for screening mammogram for malignant neoplasm of breast: Secondary | ICD-10-CM

## 2017-12-18 DIAGNOSIS — I25118 Atherosclerotic heart disease of native coronary artery with other forms of angina pectoris: Secondary | ICD-10-CM | POA: Diagnosis not present

## 2017-12-18 DIAGNOSIS — I6523 Occlusion and stenosis of bilateral carotid arteries: Secondary | ICD-10-CM | POA: Diagnosis not present

## 2017-12-18 DIAGNOSIS — Z951 Presence of aortocoronary bypass graft: Secondary | ICD-10-CM

## 2017-12-18 NOTE — Patient Instructions (Addendum)
Medication Instructions:   No medication changes made  Labwork:  No new labs needed  Testing/Procedures:  We will order a lexiscan myoview for shortness of breath, known CAD, stable angian [place]  Your caregiver has ordered a Stress Test with nuclear imaging. The purpose of this test is to evaluate the blood supply to your heart muscle. This procedure is referred to as a "Non-Invasive Stress Test." This is because other than having an IV started in your vein, nothing is inserted or "invades" your body. Cardiac stress tests are done to find areas of poor blood flow to the heart by determining the extent of coronary artery disease (CAD). Some patients exercise on a treadmill, which naturally increases the blood flow to your heart, while others who are  unable to walk on a treadmill due to physical limitations have a pharmacologic/chemical stress agent called Lexiscan . This medicine will mimic walking on a treadmill by temporarily increasing your coronary blood flow.   Please note: these test may take anywhere between 2-4 hours to complete  PLEASE REPORT TO [facility] AT THE FIRST DESK WILL DIRECT YOU WHERE TO GO  Date of Procedure:_____________________________________  Arrival Time for Procedure:______________________________  Instructions regarding medication:   __X__:  Hold amlodipine the night before procedure and morning of procedure    PLEASE NOTIFY THE OFFICE AT LEAST 24 HOURS IN ADVANCE IF YOU ARE UNABLE TO KEEP YOUR APPOINTMENT.  [phone] AND  PLEASE NOTIFY NUCLEAR MEDICINE AT [facility] AT LEAST 24 HOURS IN ADVANCE IF YOU ARE UNABLE TO KEEP YOUR APPOINTMENT. [phone]  How to prepare for your Myoview test:  1. Do not eat or drink after midnight 2. No caffeine for 24 hours prior to test 3. No smoking 24 hours prior to test. 4. Your medication may be taken with water.  If your doctor stopped a medication because of this test, do not  take that medication. 5. Ladies, please do not wear dresses.  Skirts or pants are appropriate. Please wear a short sleeve shirt. 6. No perfume, cologne or lotion. 7. Wear comfortable walking shoes. No heels!           Follow-Up: It was a pleasure seeing you in the office today. Please call us if you have new issues that need to be addressed before your next appt.  [phone]  Your physician wants you to follow-up in: 12 months.  You will receive a reminder letter in the mail two months in advance. If you don't receive a letter, please call our office to schedule the follow-up appointment.  If you need a refill on your cardiac medications before your next appointment, please call your pharmacy.  For educational health videos Log in to : [email or web address] Or : [email or web address], password : [email or web address]

## 2017-12-22 ENCOUNTER — Encounter
Admission: RE | Admit: 2017-12-22 | Discharge: 2017-12-22 | Disposition: A | Discharge status: Home / Self Care | Payer: Medicare Other | Source: Ambulatory Visit | Attending: Cardiovascular Disease | Admitting: Cardiovascular Disease

## 2017-12-22 DIAGNOSIS — I208 Other forms of angina pectoris: Secondary | ICD-10-CM

## 2017-12-22 LAB — NM MYOCAR MULTI W/SPECT W/WALL MOTION / EF
CHL CUP MPHR: 131 {beats}/min
CHL CUP NUCLEAR SDS: 0
CSEPEDS: 0 s
Estimated workload: 1 METS
Exercise duration (min): 0 min
LV dias vol: 33 mL (ref 46–106)
LVSYSVOL: 21 mL
NUC STRESS TID: 1
Peak HR: 105 {beats}/min
Percent HR: 80 %
Rest HR: 70 {beats}/min
SRS: 0
SSS: 0

## 2017-12-22 MED ORDER — REGADENOSON 0.4 MG/5ML IV SOLN
0.4000 mg | Freq: Once | INTRAVENOUS | Status: AC
  Administered 2017-12-22: 0.4 mg via INTRAVENOUS

## 2017-12-22 MED ORDER — TECHNETIUM TC 99M TETROFOSMIN IV KIT
32.4300 | PACK | Freq: Once | INTRAVENOUS | Status: AC | PRN
  Administered 2017-12-22: 32.43 via INTRAVENOUS

## 2017-12-22 MED ORDER — TECHNETIUM TC 99M TETROFOSMIN IV KIT
13.2500 | PACK | Freq: Once | INTRAVENOUS | Status: AC | PRN
  Administered 2017-12-22: 13.25 via INTRAVENOUS

## 2017-12-25 ENCOUNTER — Ambulatory Visit (INDEPENDENT_AMBULATORY_CARE_PROVIDER_SITE_OTHER): Payer: Medicare Other | Admitting: Internal Medicine

## 2017-12-25 ENCOUNTER — Encounter: Payer: Self-pay | Admitting: Internal Medicine

## 2017-12-25 DIAGNOSIS — F411 Generalized anxiety disorder: Secondary | ICD-10-CM

## 2017-12-25 DIAGNOSIS — F419 Anxiety disorder, unspecified: Secondary | ICD-10-CM

## 2017-12-25 DIAGNOSIS — H6123 Impacted cerumen, bilateral: Secondary | ICD-10-CM

## 2017-12-25 DIAGNOSIS — Z09 Encounter for follow-up examination after completed treatment for conditions other than malignant neoplasm: Secondary | ICD-10-CM | POA: Diagnosis not present

## 2017-12-25 DIAGNOSIS — F5105 Insomnia due to other mental disorder: Secondary | ICD-10-CM | POA: Diagnosis not present

## 2017-12-25 DIAGNOSIS — I6523 Occlusion and stenosis of bilateral carotid arteries: Secondary | ICD-10-CM | POA: Diagnosis not present

## 2017-12-25 MED ORDER — CLOPIDOGREL BISULFATE 75 MG PO TABS: ORAL_TABLET | ORAL | 2 refills | Status: DC

## 2017-12-25 MED ORDER — LOSARTAN POTASSIUM 100 MG PO TABS: 100.0000 mg | ORAL_TABLET | Freq: Every day | ORAL | 2 refills | Status: DC

## 2017-12-25 NOTE — Patient Instructions (Addendum)
Our Goal for blood pressure is 130/80  Or  less,  (ideally 120/70).  This preserves kidney function and lowers your risk for heart attacks and strokes    Your wound is not infected .  Keep wound protected 24/7 until healed,  flush with sterile saline solution after showering to get rid of bacteria in shower water.   To protect your kidneys:  Avoid aleve and motrin.  You can use tylenol up to 2000 g every day for pain control   Sinus irrigation during pollen season is very helpful in preventing  Sinusitis from congestion, , also add a non sedating antihistamin (clariitni)  You should try [employer]'s Sinus rinse ;  It is a stong sinus "flush" using water and medicated salts.  Do it over the sink because it can be a bit messy

## 2017-12-25 NOTE — Progress Notes (Signed)
Subjective:  Patient ID: [name], female    DOB: 1926/04/23  Age: 84 y.o. MRN: [id number]  CC: There were no encounter diagnoses.  HPI [name] presents for follow up on mood disorder noted last month.  She has been taking lexapro 5 mg daily since her last visit and feels better. Less tearful,  Less moody.   More energetic.    Dermatology eval with excision  X 2 to skin caner on left lower leg. Worried aobut infection ,  Wound care discussed   Cerumen impaction  Right ear.  Left not complete  New hearing aides  Since June,  Not satisified.      Outpatient Medications Prior to Visit  Medication Sig Dispense Refill  . amLODipine (NORVASC) 2.5 MG tablet TAKE ONE TABLET EVERY DAY 90 tablet 1  . aspirin 81 MG tablet Take 81 mg by mouth daily.      . clopidogrel (PLAVIX) 75 MG tablet TAKE 1 TABLET BY MOUTH ONE TIME DAILY 90 tablet 2  . escitalopram (LEXAPRO) 10 MG tablet Take 1 tablet (10 mg total) by mouth daily. 30 tablet 2  . esomeprazole (NEXIUM) 40 MG capsule TAKE 1 CAPSULE DAILY BEFORE BREAKFAST 90 capsule 1  . Ipratropium-Albuterol (COMBIVENT RESPIMAT) 20-100 MCG/ACT AERS respimat Inhale 1 puff into the lungs every 6 (six) hours. 1 Inhaler 10  . LORazepam (ATIVAN) 0.5 MG tablet Take 1 tablet (0.5 mg total) by mouth at bedtime. As needed for insomnia 30 tablet 5  . losartan (COZAAR) 100 MG tablet TAKE 1 TABLET BY MOUTH DAILY 90 tablet 2  . nitroGLYCERIN (NITROSTAT) 0.4 MG SL tablet DISSOLVE 1 TABLET UNDER TONGUE EVERY FIVE MINUTES UP TO 3 DOSES AS NEEDED 25 tablet 0  . simvastatin (ZOCOR) 40 MG tablet TAKE ONE TABLET AT BEDTIME 90 tablet 3   No facility-administered medications prior to visit.     Review of Systems;  Patient denies headache, fevers, malaise, unintentional weight loss, skin rash, eye pain, sinus congestion and sinus pain, sore throat, dysphagia,  hemoptysis , cough, dyspnea, wheezing, chest pain, palpitations, orthopnea, edema, abdominal pain,  nausea, melena, diarrhea, constipation, flank pain, dysuria, hematuria, urinary  Frequency, nocturia, numbness, tingling, seizures,  Focal weakness, Loss of consciousness,  Tremor, insomnia, depression, anxiety, and suicidal ideation.      Objective:  BP 122/64 (BP Location: Left Arm, Patient Position: Sitting, Cuff Size: Normal)   Pulse 62   Temp 98.1 F (36.7 C) (Oral)   Resp 15   Ht 5' 3.5" (1.613 m)   Wt 132 lb 12.8 oz (60.2 kg)   SpO2 96%   BMI 23.16 kg/m   BP Readings from Last 3 Encounters:  12/25/17 122/64  12/18/17 130/66  11/21/17 130/62    Wt Readings from Last 3 Encounters:  12/25/17 132 lb 12.8 oz (60.2 kg)  12/18/17 132 lb (59.9 kg)  11/21/17 134 lb 12.8 oz (61.1 kg)    General appearance: alert, cooperative and appears stated age Ears: normal TM's and external ear canals both ears Throat: lips, mucosa, and tongue normal; teeth and gums normal Neck: no adenopathy, no carotid bruit, supple, symmetrical, trachea midline and thyroid not enlarged, symmetric, no tenderness/mass/nodules Back: symmetric, no curvature. ROM normal. No CVA tenderness. Lungs: clear to auscultation bilaterally Heart: regular rate and rhythm, S1, S2 normal, no murmur, click, rub or gallop Abdomen: soft, non-tender; bowel sounds normal; no masses,  no organomegaly Pulses: 2+ and symmetric Skin: dime sized excisional wound without erythema. Skin turgor  normal. No rashes or lesions Lymph nodes: Cervical, supraclavicular, and axillary nodes normal.  Lab Results  Component Value Date   HGBA1C 5.8 01/30/2017   HGBA1C 5.8 09/26/2016    Lab Results  Component Value Date   CREATININE 1.31 (H) 09/21/2017   CREATININE 1.67 (H) 09/13/2017   CREATININE 1.23 (H) 09/08/2017    Lab Results  Component Value Date   WBC 6.4 09/13/2017   HGB 13.2 09/13/2017   HCT 38.6 09/13/2017   PLT 171 09/13/2017   GLUCOSE 95 09/21/2017   CHOL 109 12/08/2016   TRIG 122.0 12/08/2016   HDL 31.30 (L)  12/08/2016   LDLDIRECT 72.0 11/09/2015   LDLCALC 53 12/08/2016   ALT 16 09/07/2017   AST 28 09/07/2017   NA 141 09/21/2017   K 4.9 09/21/2017   CL 105 09/21/2017   CREATININE 1.31 (H) 09/21/2017   BUN 26 (H) 09/21/2017   CO2 29 09/21/2017   TSH 1.44 02/09/2015   INR 1.0 06/17/2014   HGBA1C 5.8 01/30/2017   MICROALBUR 2.5 (H) 12/26/2011    Nm Myocar Multi W/spect W/wall Motion / Ef  Result Date: 12/22/2017  There was no ST segment deviation noted during stress.  The study is normal.  This is a low risk study.  The left ventricular ejection fraction is normal (55-65%).  Suboptimal study due to intense GI uptake.     Assessment & Plan:   Problem List Items Addressed This Visit    None      I am having [name] maintain her aspirin, esomeprazole, clopidogrel, losartan, nitroGLYCERIN, Ipratropium-Albuterol, LORazepam, simvastatin, escitalopram, and amLODipine.  No orders of the defined types were placed in this encounter.   There are no discontinued medications.  Follow-up: No follow-ups on file.   Katrina Mc, MD

## 2017-12-26 DIAGNOSIS — H6120 Impacted cerumen, unspecified ear: Secondary | ICD-10-CM | POA: Insufficient documentation

## 2017-12-26 DIAGNOSIS — Z09 Encounter for follow-up examination after completed treatment for conditions other than malignant neoplasm: Secondary | ICD-10-CM | POA: Insufficient documentation

## 2017-12-26 NOTE — Assessment & Plan Note (Signed)
She will return for RN visit for irrigation

## 2017-12-26 NOTE — Assessment & Plan Note (Signed)
Improved with initiation of lexapro, continue x 6 months ,  Then taper off

## 2017-12-26 NOTE — Assessment & Plan Note (Signed)
She has had decreased sleep with 0.5 mg alprazolam with early wakenings nightly.  improved sleep with Trial of lorazepam . The risks and benefits of benzodiazepine use were reviewed with patient today including excessive sedation leading to respiratory depression,  impaired thinking/driving, and addiction.  Patient was advised to avoid concurrent use with alcohol, to use medication only as needed and not to share with others  .

## 2017-12-26 NOTE — Assessment & Plan Note (Signed)
No signs of infection  Proper  Wound care outlined

## 2017-12-27 ENCOUNTER — Ambulatory Visit: Payer: Medicare Other

## 2018-01-04 ENCOUNTER — Ambulatory Visit (INDEPENDENT_AMBULATORY_CARE_PROVIDER_SITE_OTHER): Payer: Medicare Other

## 2018-01-04 DIAGNOSIS — H6123 Impacted cerumen, bilateral: Secondary | ICD-10-CM

## 2018-01-04 NOTE — Progress Notes (Signed)
Patient presents for bilateral ear irrigation per Dr Derrel Nip from office visit 12/25/17 . Right and left ear irrigated without difficulty.   Reviewed.  Dr Nicki Reaper

## 2018-01-08 ENCOUNTER — Ambulatory Visit
Admission: RE | Admit: 2018-01-08 | Discharge: 2018-01-08 | Disposition: A | Discharge status: Home / Self Care | Payer: Medicare Other | Source: Ambulatory Visit | Attending: Internal Medicine | Admitting: Internal Medicine

## 2018-01-08 DIAGNOSIS — Z1231 Encounter for screening mammogram for malignant neoplasm of breast: Secondary | ICD-10-CM | POA: Insufficient documentation

## 2018-01-17 DIAGNOSIS — T8140XA Infection following a procedure, unspecified, initial encounter: Secondary | ICD-10-CM | POA: Diagnosis not present

## 2018-01-26 ENCOUNTER — Other Ambulatory Visit: Payer: Self-pay | Admitting: *Deleted

## 2018-01-26 MED ORDER — NITROGLYCERIN 0.4 MG SL SUBL: SUBLINGUAL_TABLET | SUBLINGUAL | 1 refills | Status: DC

## 2018-01-29 ENCOUNTER — Ambulatory Visit: Payer: Self-pay

## 2018-01-29 NOTE — Telephone Encounter (Signed)
Patient called in with c/o "black stools." She says "I mentioned it to the doctor the last time I was in the office on 12/25/17. At first we thought it was something I ate, but it has been going on since then and something has to be done." I asked about symptoms, such as nausea, diarrhea, dizziness, lightheaded, abdominal pain, she says "I don't have any of those symptoms. My stools are not diarrhea, but soft and formed. Sometimes it's brown, but mostly they have been black." According to protocol, see PCP within 3 days, appointment scheduled for Wednesday at 1000 with Dr. Derrel Nip, care advice given, patient verbalized understanding.  Reason for Disposition . [1] Abnormal color is unexplained AND [2] persists > 24 hours  Answer Assessment - Initial Assessment Questions 1. COLOR: "What color is it?" "Is that color in part or all of the stool?"     Black 2. ONSET: "When was the unusual color first noted?"     For about a month 3. CAUSE: "Have you eaten any food or taken any medicine of this color?" (See listing in BACKGROUND)    No 4. OTHER SYMPTOMS: "Do you have any other symptoms?" (e.g., diarrhea, jaundice, abdominal pain, fever).     No  Protocols used: STOOLS - UNUSUAL COLOR-A-AH

## 2018-01-31 ENCOUNTER — Encounter: Payer: Self-pay | Admitting: Internal Medicine

## 2018-01-31 ENCOUNTER — Ambulatory Visit (INDEPENDENT_AMBULATORY_CARE_PROVIDER_SITE_OTHER): Payer: Medicare Other | Admitting: Internal Medicine

## 2018-01-31 VITALS — BP 138/60 | HR 62 | Temp 98.0°F | Resp 14 | Ht 63.5 in | Wt 134.4 lb

## 2018-01-31 DIAGNOSIS — I6523 Occlusion and stenosis of bilateral carotid arteries: Secondary | ICD-10-CM | POA: Diagnosis not present

## 2018-01-31 DIAGNOSIS — K921 Melena: Secondary | ICD-10-CM | POA: Diagnosis not present

## 2018-01-31 LAB — COMPREHENSIVE METABOLIC PANEL
ALT: 14 U/L (ref 0–35)
AST: 16 U/L (ref 0–37)
Albumin: 4 g/dL (ref 3.5–5.2)
Alkaline Phosphatase: 56 U/L (ref 39–117)
BILIRUBIN TOTAL: 0.5 mg/dL (ref 0.2–1.2)
BUN: 21 mg/dL (ref 6–23)
CALCIUM: 8.8 mg/dL (ref 8.4–10.5)
CHLORIDE: 104 meq/L (ref 96–112)
CO2: 27 meq/L (ref 19–32)
Creatinine, Ser: 1.25 mg/dL — ABNORMAL HIGH (ref 0.40–1.20)
GFR: 42.85 mL/min — AB (ref >60.00)
Glucose, Bld: 92 mg/dL (ref 70–99)
Potassium: 4.4 mEq/L (ref 3.5–5.1)
Sodium: 139 mEq/L (ref 135–145)
Total Protein: 6.5 g/dL (ref 6.0–8.3)

## 2018-01-31 LAB — CBC WITH DIFFERENTIAL/PLATELET
BASOS PCT: 1.3 % (ref 0.0–3.0)
Basophils Absolute: 0.1 10*3/uL (ref 0.0–0.1)
EOS PCT: 2.5 % (ref 0.0–5.0)
Eosinophils Absolute: 0.1 10*3/uL (ref 0.0–0.7)
HCT: 39.7 % (ref 36.0–46.0)
Hemoglobin: 13.2 g/dL (ref 12.0–15.0)
LYMPHS ABS: 1.3 10*3/uL (ref 0.7–4.0)
Lymphocytes Relative: 23.4 % (ref 12.0–46.0)
MCHC: 33.3 g/dL (ref 30.0–36.0)
MCV: 99.5 fl (ref 78.0–100.0)
MONO ABS: 0.7 10*3/uL (ref 0.1–1.0)
Monocytes Relative: 12.3 % — ABNORMAL HIGH (ref 3.0–12.0)
NEUTROS ABS: 3.4 10*3/uL (ref 1.4–7.7)
NEUTROS PCT: 60.5 % (ref 43.0–77.0)
PLATELETS: 177 10*3/uL (ref 150.0–400.0)
RBC: 3.99 Mil/uL (ref 3.87–5.11)
RDW: 13.1 % (ref 11.5–15.5)
WBC: 5.7 10*3/uL (ref 4.0–10.5)

## 2018-01-31 LAB — IRON,TIBC AND FERRITIN PANEL
%SAT: 49 % (ref 11–50)
Ferritin: 92 ng/mL (ref 20–288)
Iron: 133 ug/dL (ref 45–160)
TIBC: 269 ug/dL (ref 250–450)

## 2018-01-31 LAB — PROTIME-INR
INR: 0.9 ratio (ref 0.8–1.0)
Prothrombin Time: 10.8 s (ref 9.6–13.1)

## 2018-01-31 LAB — POC HEMOCCULT BLD/STL (OFFICE/1-CARD/DIAGNOSTIC): Fecal Occult Blood, POC: NEGATIVE

## 2018-01-31 NOTE — Progress Notes (Signed)
Subjective:  Patient ID: [name], female    DOB: April 05, 1926  Age: 84 y.o. MRN: [id number]  CC: The encounter diagnosis was Melena.  HPI [name] presents for evaluation of black stools,  Has been occurring for the past month.  Not exclusively,  Has had occasional brown stools.  No diarrhea .  No abdominal pain but occasionally  has the sensation of  Lower abdominal fullness/distension without cramping or pain. No orthostatic symptoms.   Appetite fine.  Lots of gas , but she eats fast because as a widow she eats alone 3 ays pr week  Energy lvel is at baseline  .  At 50 she is still playing 9 holes of golf regularly.   Patient takes asa and plavix on a daily basis for secondary prevention of  CAD /PAD  Does not drink alcohol or take NSAIDs   Outpatient Medications Prior to Visit  Medication Sig Dispense Refill  . amLODipine (NORVASC) 2.5 MG tablet TAKE ONE TABLET EVERY DAY 90 tablet 1  . aspirin 81 MG tablet Take 81 mg by mouth daily.      . clopidogrel (PLAVIX) 75 MG tablet TAKE 1 TABLET BY MOUTH ONE TIME DAILY 90 tablet 2  . escitalopram (LEXAPRO) 10 MG tablet Take 1 tablet (10 mg total) by mouth daily. 30 tablet 2  . esomeprazole (NEXIUM) 40 MG capsule TAKE 1 CAPSULE DAILY BEFORE BREAKFAST 90 capsule 1  . Ipratropium-Albuterol (COMBIVENT RESPIMAT) 20-100 MCG/ACT AERS respimat Inhale 1 puff into the lungs every 6 (six) hours. 1 Inhaler 10  . LORazepam (ATIVAN) 0.5 MG tablet Take 1 tablet (0.5 mg total) by mouth at bedtime. As needed for insomnia 30 tablet 5  . losartan (COZAAR) 100 MG tablet Take 1 tablet (100 mg total) by mouth daily. 90 tablet 2  . nitroGLYCERIN (NITROSTAT) 0.4 MG SL tablet DISSOLVE 1 TABLET UNDER TONGUE EVERY FIVE MINUTES UP TO 3 DOSES AS NEEDED 25 tablet 1  . simvastatin (ZOCOR) 40 MG tablet TAKE ONE TABLET AT BEDTIME 90 tablet 3   No facility-administered medications prior to visit.     Review of Systems;  Patient denies headache, fevers,  malaise, unintentional weight loss, skin rash, eye pain, sinus congestion and sinus pain, sore throat, dysphagia,  hemoptysis , cough, dyspnea, wheezing, chest pain, palpitations, orthopnea, edema, abdominal pain, nausea, melena, diarrhea, constipation, flank pain, dysuria, hematuria, urinary  Frequency, nocturia, numbness, tingling, seizures,  Focal weakness, Loss of consciousness,  Tremor, insomnia, depression, anxiety, and suicidal ideation.      Objective:  BP 138/60 (BP Location: Left Arm, Patient Position: Sitting, Cuff Size: Normal)   Pulse 62   Temp 98 F (36.7 C) (Oral)   Resp 14   Ht 5' 3.5" (1.613 m)   Wt 134 lb 6.4 oz (61 kg)   SpO2 95%   BMI 23.43 kg/m   BP Readings from Last 3 Encounters:  01/31/18 138/60  12/25/17 122/64  12/18/17 130/66    Wt Readings from Last 3 Encounters:  01/31/18 134 lb 6.4 oz (61 kg)  12/25/17 132 lb 12.8 oz (60.2 kg)  12/18/17 132 lb (59.9 kg)    General appearance: alert, cooperative and appears stated age Ears: normal TM's and external ear canals both ears Throat: lips, mucosa, and tongue normal; teeth and gums normal Neck: no adenopathy, no carotid bruit, supple, symmetrical, trachea midline and thyroid not enlarged, symmetric, no tenderness/mass/nodules Back: symmetric, no curvature. ROM normal. No CVA tenderness. Lungs: clear to auscultation bilaterally  Heart: regular rate and rhythm, S1, S2 normal, no murmur, click, rub or gallop Abdomen: soft, non-tender; bowel sounds normal; no masses,  no organomegaly Pulses: 2+ and symmetric Skin: Skin color, texture, turgor normal. No rashes or lesions Rectal:  Brown stool in vault hemoccult negative  Lymph nodes: Cervical, supraclavicular, and axillary nodes normal.  Lab Results  Component Value Date   HGBA1C 5.8 01/30/2017   HGBA1C 5.8 09/26/2016    Lab Results  Component Value Date   CREATININE 1.25 (H) 01/31/2018   CREATININE 1.31 (H) 09/21/2017   CREATININE 1.67 (H) 09/13/2017      Lab Results  Component Value Date   WBC 5.7 01/31/2018   HGB 13.2 01/31/2018   HCT 39.7 01/31/2018   PLT 177.0 01/31/2018   GLUCOSE 92 01/31/2018   CHOL 109 12/08/2016   TRIG 122.0 12/08/2016   HDL 31.30 (L) 12/08/2016   LDLDIRECT 72.0 11/09/2015   LDLCALC 53 12/08/2016   ALT 14 01/31/2018   AST 16 01/31/2018   NA 139 01/31/2018   K 4.4 01/31/2018   CL 104 01/31/2018   CREATININE 1.25 (H) 01/31/2018   BUN 21 01/31/2018   CO2 27 01/31/2018   TSH 1.44 02/09/2015   INR 0.9 01/31/2018   HGBA1C 5.8 01/30/2017   MICROALBUR 2.5 (H) 12/26/2011    Mm Screening Breast Tomo Bilateral  Result Date: 01/08/2018 CLINICAL DATA:  Screening. EXAM: DIGITAL SCREENING BILATERAL MAMMOGRAM WITH TOMO AND CAD COMPARISON:  Previous exam(s). ACR Breast Density Category c: The breast tissue is heterogeneously dense, which may obscure small masses. FINDINGS: There are no findings suspicious for malignancy. Images were processed with CAD. IMPRESSION: No mammographic evidence of malignancy. A result letter of this screening mammogram will be mailed directly to the patient. RECOMMENDATION: Screening mammogram in one year. (Code:SM-B-01Y) BI-RADS CATEGORY  1: Negative. Electronically Signed   By: Lajean Manes M.D.   On: 01/08/2018 12:12    Assessment & Plan:   Problem List Items Addressed This Visit    Melena - Primary    Reported by patient for the last month, with a negative hemoccult in office today, norma CBC and iron studies today as well. She has no history of NSAID  Or alcohol abuse  And denies any history of gastritis.  patient will submit samples for repeat hemoccult .   Lab Results  Component Value Date   WBC 5.7 01/31/2018   HGB 13.2 01/31/2018   HCT 39.7 01/31/2018   MCV 99.5 01/31/2018   PLT 177.0 01/31/2018   Lab Results  Component Value Date   IRON 133 01/31/2018   TIBC 269 01/31/2018   FERRITIN 92 01/31/2018        Relevant Orders   POC Hemoccult Bld/Stl (1-Cd Office Dx)  (Completed)   Fecal occult blood, imunochemical   CBC with Differential/Platelet (Completed)   INR/PT (Completed)   Comprehensive metabolic panel (Completed)   Iron, TIBC and Ferritin Panel (Completed)      I am having [name] maintain her aspirin, esomeprazole, Ipratropium-Albuterol, LORazepam, simvastatin, escitalopram, amLODipine, clopidogrel, losartan, and nitroGLYCERIN.  No orders of the defined types were placed in this encounter.   There are no discontinued medications.  Follow-up: No follow-ups on file.   [name], MD

## 2018-01-31 NOTE — Patient Instructions (Signed)
Your stool test today was negative for blood  I want you to do the home tests for several days in a row  to confirm that there is no blood  In your stool

## 2018-02-03 DIAGNOSIS — K921 Melena: Secondary | ICD-10-CM | POA: Insufficient documentation

## 2018-02-03 NOTE — Assessment & Plan Note (Addendum)
Reported by patient for the last month, with a negative hemoccult in office today, norma CBC and iron studies today as well. She has no history of NSAID  Or alcohol abuse  And denies any history of gastritis.  patient will submit samples for repeat hemoccult .   Lab Results  Component Value Date   WBC 5.7 01/31/2018   HGB 13.2 01/31/2018   HCT 39.7 01/31/2018   MCV 99.5 01/31/2018   PLT 177.0 01/31/2018   Lab Results  Component Value Date   IRON 133 01/31/2018   TIBC 269 01/31/2018   FERRITIN 92 01/31/2018

## 2018-02-07 ENCOUNTER — Other Ambulatory Visit (INDEPENDENT_AMBULATORY_CARE_PROVIDER_SITE_OTHER): Payer: Medicare Other

## 2018-02-07 DIAGNOSIS — K921 Melena: Secondary | ICD-10-CM

## 2018-02-07 LAB — FECAL OCCULT BLOOD, IMMUNOCHEMICAL: Fecal Occult Bld: NEGATIVE

## 2018-02-08 ENCOUNTER — Other Ambulatory Visit: Payer: Self-pay | Admitting: Internal Medicine

## 2018-02-15 ENCOUNTER — Encounter: Payer: Self-pay | Admitting: Family

## 2018-02-15 ENCOUNTER — Ambulatory Visit (INDEPENDENT_AMBULATORY_CARE_PROVIDER_SITE_OTHER): Payer: Medicare Other | Admitting: Family

## 2018-02-15 ENCOUNTER — Ambulatory Visit (INDEPENDENT_AMBULATORY_CARE_PROVIDER_SITE_OTHER): Payer: Medicare Other

## 2018-02-15 VITALS — BP 128/82 | HR 87 | Temp 99.2°F | Resp 16 | Wt 132.1 lb

## 2018-02-15 DIAGNOSIS — I6523 Occlusion and stenosis of bilateral carotid arteries: Secondary | ICD-10-CM | POA: Diagnosis not present

## 2018-02-15 DIAGNOSIS — R05 Cough: Secondary | ICD-10-CM

## 2018-02-15 DIAGNOSIS — R059 Cough, unspecified: Secondary | ICD-10-CM

## 2018-02-15 MED ORDER — BENZONATATE 100 MG PO CAPS: 100.0000 mg | ORAL_CAPSULE | Freq: Three times a day (TID) | ORAL | 0 refills | Status: DC | PRN

## 2018-02-15 NOTE — Patient Instructions (Addendum)
Please complete chest x ray prior to leaving.   You may use tessalon as needed for cough.  Call if symptoms worsen or if you are not improved in 3-4 days.

## 2018-02-15 NOTE — Progress Notes (Signed)
Subjective:    Patient ID: [name], female    DOB: [birth year], 84 y.o.   MRN: [id number]  HPI  Pt is an 84 yr old female who presents today with chief complaint of cough. She reports that cough began 4 days ago. She denies known sick contacts. She denies known fever. Did take a dose of tylenol at 1:30 PM today.  Also played 9 holes of golf out in the heat today which she regrets.  She denies associated CP.  She does report some SOB. Reports that her legs ache which happened to her one other time when she had pneumonia. Though notes that the aching is mild at this time and it was severe last time it occurred. Tried tessalon which helped some with cough. Denies ear pain. Has mild nasal congestion.  Initially she attributed her symptoms to pollen.   Review of Systems    see HPI  Past Medical History:  Diagnosis Date  . C. difficile colitis   . CAD (coronary artery disease)    a. Nuclear 10/11, not gated, no scar or ischemia, EF 68%, low risk study; b. [place] 06/18/14: no evidence of infarction or ischemia, no WMA, normalization of TWI in precordial leads, equivocal EKG changes EF 77%, low risk study  . Carotid artery disease ([place])    Doppler January, 2012, 40-59% bilateral, followup one year  . COPD (chronic obstructive pulmonary disease) ([place])   . Diffuse cystic mastopathy   . Diverticulitis    last colonoscopy  incomplete Jan 2011  . Dizziness    stable now (Oct 2011) patient tells me question of TIA, we will obtain records  . Gait abnormality    Patient complains of "wobbly gait", December, 2013  . GERD (gastroesophageal reflux disease)   . History of migraines   . Hx of CABG    a. 3 vessel in 1999  . Hyperlipidemia   . Hypertension   . Indigestion    3 weeks, October 2011  . Kidney stones   . Rheumatic fever   . Sinus bradycardia    Mild, December, 2013  . SOB (shortness of breath)   . Syncopal episodes    after 18 holes of golf and increase  hydrochlorothiazide, resolved      Social History   Socioeconomic History  . Marital status: Widowed    Spouse name: Not on file  . Number of children: 2  . Years of education: Not on file  . Highest education level: Not on file  Occupational History    Employer: RETIRED  Social Needs  . Financial resource strain: Not on file  . Food insecurity:    Worry: Not on file    Inability: Not on file  . Transportation needs:    Medical: Not on file    Non-medical: Not on file  Tobacco Use  . Smoking status: Former Smoker    Packs/day: 0.50    Years: 40.00    Pack years: 20.00    Types: Cigarettes    Last attempt to quit: 09/20/1983    Years since quitting: 34.4  . Smokeless tobacco: Never Used  Substance and Sexual Activity  . Alcohol use: Yes    Comment: yes, social wine  . Drug use: No  . Sexual activity: Never  Lifestyle  . Physical activity:    Days per week: Not on file    Minutes per session: Not on file  . Stress: Not on file  Relationships  .  Social connections:    Talks on phone: Not on file    Gets together: Not on file    Attends religious service: Not on file    Active member of club or organization: Not on file    Attends meetings of clubs or organizations: Not on file    Relationship status: Not on file  . Intimate partner violence:    Fear of current or ex partner: Not on file    Emotionally abused: Not on file    Physically abused: Not on file    Forced sexual activity: Not on file  Other Topics Concern  . Not on file  Social History Narrative   Lives by herself, ambulates independently at baseline    Past Surgical History:  Procedure Laterality Date  . APPENDECTOMY  1950  . BREAST BIOPSY Right 1994  . BREAST CYST ASPIRATION     multiple BIL  . CATARACT EXTRACTION Right 2009  . CATARACT EXTRACTION Left 2011  . COLONOSCOPY  [id number]   Dr. Jamal Collin  . CORONARY ARTERY BYPASS GRAFT  1999  . esophagus stretched    . TONSILLECTOMY  1939  . TOTAL  ABDOMINAL HYSTERECTOMY  1968   due to metrorrhagia    Family History  Problem Relation Age of Onset  . Heart disease Mother   . Heart disease Father   . Cancer Neg Hx   . Drug abuse Neg Hx   . Breast cancer Neg Hx     Allergies  Allergen Reactions  . Codeine Nausea And Vomiting    Current Outpatient Medications on File Prior to Visit  Medication Sig Dispense Refill  . amLODipine (NORVASC) 2.5 MG tablet TAKE ONE TABLET EVERY DAY 90 tablet 1  . aspirin 81 MG tablet Take 81 mg by mouth daily.      . clopidogrel (PLAVIX) 75 MG tablet TAKE 1 TABLET BY MOUTH ONE TIME DAILY 90 tablet 2  . COMBIVENT RESPIMAT 20-100 MCG/ACT AERS respimat INHALE ONE PUFF EVERY SIX HOURS 4 g 2  . escitalopram (LEXAPRO) 10 MG tablet Take 1 tablet (10 mg total) by mouth daily. 30 tablet 2  . esomeprazole (NEXIUM) 40 MG capsule TAKE 1 CAPSULE DAILY BEFORE BREAKFAST 90 capsule 1  . LORazepam (ATIVAN) 0.5 MG tablet Take 1 tablet (0.5 mg total) by mouth at bedtime. As needed for insomnia 30 tablet 5  . losartan (COZAAR) 100 MG tablet Take 1 tablet (100 mg total) by mouth daily. 90 tablet 2  . nitroGLYCERIN (NITROSTAT) 0.4 MG SL tablet DISSOLVE 1 TABLET UNDER TONGUE EVERY FIVE MINUTES UP TO 3 DOSES AS NEEDED 25 tablet 1  . simvastatin (ZOCOR) 40 MG tablet TAKE ONE TABLET AT BEDTIME 90 tablet 3   No current facility-administered medications on file prior to visit.     BP 128/82 (BP Location: Left Arm, Patient Position: Sitting, Cuff Size: Normal)   Pulse 87   Temp 99.2 F (37.3 C) (Oral)   Resp 16   Wt 132 lb 2 oz (59.9 kg)   SpO2 97%   BMI 23.04 kg/m    Objective:   Physical Exam  Constitutional: She appears well-developed and well-nourished.  HENT:  Right Ear: Tympanic membrane and ear canal normal.  Left Ear: Tympanic membrane and ear canal normal.  Mouth/Throat: Oropharynx is clear and moist. No oropharyngeal exudate.  Cardiovascular: Normal rate, regular rhythm and normal heart sounds.  No  murmur heard. Pulmonary/Chest: Effort normal. No respiratory distress. She has decreased breath sounds in the right  lower field. She has no wheezes.  Psychiatric: She has a normal mood and affect. Her behavior is normal. Judgment and thought content normal.          Assessment & Plan:  Cough- need to rule out pneumonia. Offered empiric rx but she is hesitant due to previous hx of c diff.  She is agreeable to CXR and antibiotics if + for PNA.  Continue tessalon as needed for cough.  She is advised to call if symptoms worsen or if they fail to improve in the next 3-4 days. Pt verbalizes understanding.

## 2018-02-17 DIAGNOSIS — J441 Chronic obstructive pulmonary disease with (acute) exacerbation: Secondary | ICD-10-CM | POA: Diagnosis not present

## 2018-02-20 ENCOUNTER — Other Ambulatory Visit: Payer: Self-pay

## 2018-02-20 MED ORDER — LORAZEPAM 0.5 MG PO TABS: 0.5000 mg | ORAL_TABLET | Freq: Every day | ORAL | 5 refills | Status: DC

## 2018-02-20 NOTE — Telephone Encounter (Signed)
Printed, signed and faxed.  

## 2018-02-20 NOTE — Telephone Encounter (Signed)
Ok to refill,  Authorized in epic and printed  

## 2018-02-20 NOTE — Telephone Encounter (Signed)
Refilled: 09/21/2017 Last OV: 01/31/2018 Next OV: 07/02/2018

## 2018-02-22 ENCOUNTER — Ambulatory Visit (INDEPENDENT_AMBULATORY_CARE_PROVIDER_SITE_OTHER): Payer: Medicare Other | Admitting: Internal Medicine

## 2018-02-22 ENCOUNTER — Encounter: Payer: Self-pay | Admitting: Internal Medicine

## 2018-02-22 VITALS — BP 130/70 | HR 85 | Resp 16 | Ht 63.5 in | Wt 127.0 lb

## 2018-02-22 DIAGNOSIS — I6523 Occlusion and stenosis of bilateral carotid arteries: Secondary | ICD-10-CM | POA: Diagnosis not present

## 2018-02-22 DIAGNOSIS — J449 Chronic obstructive pulmonary disease, unspecified: Secondary | ICD-10-CM

## 2018-02-22 MED ORDER — GLYCOPYRROLATE-FORMOTEROL 9-4.8 MCG/ACT IN AERO: 2.0000 | INHALATION_SPRAY | Freq: Every day | RESPIRATORY_TRACT | 0 refills | Status: DC

## 2018-02-22 NOTE — Patient Instructions (Addendum)
Will start on bevespi inhaler two puffs once daily, call us back on Monday if you are not feeling better.

## 2018-02-22 NOTE — Progress Notes (Signed)
PROBLEMS: Former smoker Mild COPD - very well compensated  INTERVAL HISTORY: Hospitalized 04/04-04/06/18 after syncopal event. Discharge diagnosis was COPD exacerbation. Patient was not seen by pulmonary service.  SUBJ: The patient is a 84 year old female, last saw Dr. Alva Garnet approximately 1 year ago, she has a history of COPD with hospitalization in 2018 for COPD exacerbation.  She was asked to continue Combivent. She had a cough about a week ago, she went to urgent care and got a course of prednisone, tessalon, doxy.  Since then she thinks that the cough is improving slightly. However she notes that she is out of breath, she is using combivent every 6 hours, not sure that it helps. She takes no other inhalers.   Images personally reviewed; CXR 02/15/18;   OBJ: Vitals:   02/22/18 1455 02/22/18 1459  BP:  130/70  Pulse:  85  Resp: 16   SpO2:  92%  Weight: 127 lb (57.6 kg)   Height: 5' 3.5" (1.613 m)     Gen: WDWN in NAD HEENT: NCAT, sclerae white, oropharynx normal Neck: NO LAN, no JVD noted Lungs: Breath sounds are mildly diminished with no wheezes or other adventitious sounds Cardiovascular: Reg rate, normal rhythm, no M noted Abdomen: Soft, NT, +BS Ext: no C/C/E Neuro: PERRL, EOMI, motor/sensory grossly intact Skin: No lesions noted    DATA: CXR 12/21/16: No acute cardiac or pulmonary findings.  IMPRESSION: COPD with acute bronchitis/AECOPD.     PLAN: Start sample of bevespi 2 puffs once daily until completed.  Call back in 4 days if not feeling better. At that time may consider nebulizer, steroid taper or CT angio.   Follow up as needed  Deep Ashby Dawes, MD.   Board Certified in Internal Medicine, Pulmonary Medicine, [place], and Sleep Medicine.  [facility] Office Number: [phone] Pager: [phone]  Patricia Pesa, M.D.  Merton Border, M.D  02/22/2018 9:58 AM

## 2018-03-14 ENCOUNTER — Ambulatory Visit (INDEPENDENT_AMBULATORY_CARE_PROVIDER_SITE_OTHER): Payer: Medicare Other

## 2018-03-14 VITALS — BP 126/78 | HR 62 | Temp 98.1°F | Resp 15 | Ht 61.0 in | Wt 130.8 lb

## 2018-03-14 DIAGNOSIS — Z0000 Encounter for general adult medical examination without abnormal findings: Secondary | ICD-10-CM | POA: Diagnosis not present

## 2018-03-14 NOTE — Patient Instructions (Addendum)
  Ms. Tobey , Thank you for taking time to come for your Medicare Wellness Visit. I appreciate your ongoing commitment to your health goals. Please review the following plan we discussed and let me know if I can assist you in the future.   These are the goals we discussed: Goals    . DIET - INCREASE WATER INTAKE     Stay hydrated       This is a list of the screening recommended for you and due dates:  Health Maintenance  Topic Date Due  . Flu Shot  04/19/2018  . Tetanus Vaccine  02/08/2023  . DEXA scan (bone density measurement)  Completed  . Pneumonia vaccines  Completed

## 2018-03-14 NOTE — Progress Notes (Addendum)
Subjective:   [name] is a 84 y.o. female who presents for Medicare Annual (Subsequent) preventive examination.  Review of Systems:  No ROS.  Medicare Wellness Visit. Additional risk factors are reflected in the social history. Cardiac Risk Factors include: advanced age (>72men, >42 women);hypertension     Objective:     Vitals: BP 126/78 (BP Location: Left Arm, Patient Position: Sitting, Cuff Size: Normal)   Pulse 62   Temp 98.1 F (36.7 C) (Oral)   Resp 15   Ht 5\' 1"  (1.549 m)   Wt 130 lb 12.8 oz (59.3 kg)   SpO2 97%   BMI 24.71 kg/m   Body mass index is 24.71 kg/m.  Advanced Directives 03/14/2018 09/13/2017 09/07/2017 03/08/2017 03/08/2017 12/21/2016 12/21/2016  Does Patient Have a Medical Advance Directive? Yes Yes Yes Yes Yes Yes No  Type of Paramedic of [place];Living will [place];Living will [place];Living will Living will [place];Living will Living will;Healthcare Power of Attorney -  Does patient want to make changes to medical advance directive? No - Patient declined No - Patient declined No - Patient declined No - Patient declined - No - Patient declined -  Copy of [place] in Chart? No - copy requested No - copy requested No - copy requested Yes Yes No - copy requested -    Tobacco Social History   Tobacco Use  Smoking Status Former Smoker  . Packs/day: 0.50  . Years: 40.00  . Pack years: 20.00  . Types: Cigarettes  . Last attempt to quit: 09/20/1983  . Years since quitting: 34.5  Smokeless Tobacco Never Used     Counseling given: Not Answered   Clinical Intake:  Pre-visit preparation completed: Yes  Pain : No/denies pain     Diabetes: No  How often do you need to have someone help you when you read instructions, pamphlets, or other written materials from your doctor or pharmacy?: 1 - Never  Interpreter Needed?: No     Past  Medical History:  Diagnosis Date  . C. difficile colitis   . CAD (coronary artery disease)    a. Nuclear 10/11, not gated, no scar or ischemia, EF 68%, low risk study; b. [place] 06/18/14: no evidence of infarction or ischemia, no WMA, normalization of TWI in precordial leads, equivocal EKG changes EF 77%, low risk study  . Carotid artery disease ([place])    Doppler January, 2012, 40-59% bilateral, followup one year  . COPD (chronic obstructive pulmonary disease) ([place])   . Diffuse cystic mastopathy   . Diverticulitis    last colonoscopy  incomplete Jan 2011  . Dizziness    stable now (Oct 2011) patient tells me question of TIA, we will obtain records  . Gait abnormality    Patient complains of "wobbly gait", December, 2013  . GERD (gastroesophageal reflux disease)   . History of migraines   . Hx of CABG    a. 3 vessel in 1999  . Hyperlipidemia   . Hypertension   . Indigestion    3 weeks, October 2011  . Kidney stones   . Rheumatic fever   . Sinus bradycardia    Mild, December, 2013  . SOB (shortness of breath)   . Syncopal episodes    after 18 holes of golf and increase hydrochlorothiazide, resolved    Past Surgical History:  Procedure Laterality Date  . APPENDECTOMY  1950  . BREAST BIOPSY Right  1994  . BREAST CYST ASPIRATION     multiple BIL  . CATARACT EXTRACTION Right 2009  . CATARACT EXTRACTION Left 2011  . COLONOSCOPY  [id number]   Dr. Jamal Collin  . CORONARY ARTERY BYPASS GRAFT  1999  . esophagus stretched    . TONSILLECTOMY  1939  . TOTAL ABDOMINAL HYSTERECTOMY  1968   due to metrorrhagia   Family History  Problem Relation Age of Onset  . Heart disease Mother   . Heart disease Father   . Cancer Neg Hx   . Drug abuse Neg Hx   . Breast cancer Neg Hx    Social History   Socioeconomic History  . Marital status: Widowed    Spouse name: Not on file  . Number of children: 2  . Years of education: Not on file  . Highest education level: Not on file    Occupational History    Employer: RETIRED  Social Needs  . Financial resource strain: Not on file  . Food insecurity:    Worry: Not on file    Inability: Not on file  . Transportation needs:    Medical: Not on file    Non-medical: Not on file  Tobacco Use  . Smoking status: Former Smoker    Packs/day: 0.50    Years: 40.00    Pack years: 20.00    Types: Cigarettes    Last attempt to quit: 09/20/1983    Years since quitting: 34.5  . Smokeless tobacco: Never Used  Substance and Sexual Activity  . Alcohol use: Yes    Comment: yes, social wine  . Drug use: No  . Sexual activity: Never  Lifestyle  . Physical activity:    Days per week: Not on file    Minutes per session: Not on file  . Stress: Not on file  Relationships  . Social connections:    Talks on phone: Not on file    Gets together: Not on file    Attends religious service: Not on file    Active member of club or organization: Not on file    Attends meetings of clubs or organizations: Not on file    Relationship status: Not on file  Other Topics Concern  . Not on file  Social History Narrative   Lives by herself, ambulates independently at baseline    Outpatient Encounter Medications as of 03/14/2018  Medication Sig  . amLODipine (NORVASC) 2.5 MG tablet TAKE ONE TABLET EVERY DAY  . aspirin 81 MG tablet Take 81 mg by mouth daily.    . clopidogrel (PLAVIX) 75 MG tablet TAKE 1 TABLET BY MOUTH ONE TIME DAILY  . COMBIVENT RESPIMAT 20-100 MCG/ACT AERS respimat INHALE ONE PUFF EVERY SIX HOURS  . esomeprazole (NEXIUM) 40 MG capsule TAKE 1 CAPSULE DAILY BEFORE BREAKFAST  . Glycopyrrolate-Formoterol (BEVESPI AEROSPHERE) 9-4.8 MCG/ACT AERO Inhale 2 puffs into the lungs daily.  Marland Kitchen LORazepam (ATIVAN) 0.5 MG tablet Take 1 tablet (0.5 mg total) by mouth at bedtime. As needed for insomnia  . losartan (COZAAR) 100 MG tablet Take 1 tablet (100 mg total) by mouth daily.  . simvastatin (ZOCOR) 40 MG tablet TAKE ONE TABLET AT BEDTIME   . benzonatate (TESSALON) 100 MG capsule Take 1 capsule (100 mg total) by mouth 3 (three) times daily as needed. (Patient not taking: Reported on 03/14/2018)  . escitalopram (LEXAPRO) 10 MG tablet Take 1 tablet (10 mg total) by mouth daily. (Patient not taking: Reported on 02/22/2018)  . nitroGLYCERIN (NITROSTAT) 0.4 MG  SL tablet DISSOLVE 1 TABLET UNDER TONGUE EVERY FIVE MINUTES UP TO 3 DOSES AS NEEDED (Patient not taking: Reported on 03/14/2018)   No facility-administered encounter medications on file as of 03/14/2018.     Activities of Daily Living In your present state of health, do you have any difficulty performing the following activities: 03/14/2018 09/07/2017  Hearing? Tempie Donning  Vision? N N  Difficulty concentrating or making decisions? Y N  Comment Difficulty remembering names -  Walking or climbing stairs? N N  Dressing or bathing? N N  Doing errands, shopping? N N  Preparing Food and eating ? N -  Using the Toilet? N -  In the past six months, have you accidently leaked urine? N -  Do you have problems with loss of bowel control? N -  Managing your Medications? N -  Managing your Finances? N -  Housekeeping or managing your Housekeeping? N -  Some recent data might be hidden    Patient Care Team: [name], MD as PCP - General (Internal Medicine) Rockey Situ Kathlene November, MD (Cardiology) Christene Lye, MD (General Surgery) Karlyn Agee, MD (Unknown Physician Specialty)    Assessment:   This is a routine wellness examination for [name].  The goal of the wellness visit is to assist the patient how to close the gaps in care and create a preventative care plan for the patient.   The roster of all physicians providing medical care to patient is listed in the Snapshot section of the chart.  Osteoporosis risk reviewed.    Safety issues reviewed; Smoke and carbon monoxide detectors in the home. No firearms in the home. Wears seatbelts when driving or riding with  others. No violence in the home.  They do not have excessive sun exposure.  Discussed the need for sun protection: hats, long sleeves and the use of sunscreen if there is significant sun exposure.  Patient is alert, normal appearance, oriented to person/place/and time. Correctly identified the president of the [place] and recalls of 3/3 words.Performs simple calculations and can read correct time from watch face. Displays appropriate judgement.  No new identified risk were noted.  No failures at ADL's or IADL's.  Ambulates with walker at night.   BMI- discussed the importance of a healthy diet, water intake and the benefits of aerobic exercise. Educational material provided.   24 hour diet recall: She does not eat 3 course meals daily.  Her diet consists of 3 meals per week with snacks or small portions the rest of the week. States there is no decrease in her appetite but she chooses not to cook or eat heavy meals.   Encouraged Ensure Omer daily.   Dental- every 6 months.  Eye- Visual acuity not assessed per patient preference since they have regular follow up with the ophthalmologist.  Wears corrective lenses.  Sleep patterns- Sleeps 4-6 hours at night.  Wakes feeling rested.  Naps during the day as needed.   Health maintenance gaps- closed.  Patient Concerns: None at this time. Follow up with PCP as needed.  Exercise Activities and Dietary recommendations Current Exercise Habits: Home exercise routine  Goals    . DIET - INCREASE WATER INTAKE     Stay hydrated       Fall Risk Fall Risk  03/14/2018 12/25/2017 11/08/2016 08/26/2016 02/09/2015  Falls in the past year? No Yes No No No  Comment - - - Emmi Telephone Survey: data to providers prior to load -  Number  falls in past yr: - 1 - - -  Injury with Fall? - Yes - - -   Depression Screen PHQ 2/9 Scores 03/14/2018 11/21/2017 11/08/2016 02/09/2015  PHQ - 2 Score 0 0 0 0  PHQ- 9 Score - 1 - -     Cognitive Function MMSE - Mini  Mental State Exam 11/08/2016  Orientation to time 5  Orientation to Place 5  Registration 3  Attention/ Calculation 5  Recall 3  Language- name 2 objects 2  Language- repeat 1  Language- follow 3 step command 3  Language- read & follow direction 1  Write a sentence 1  Copy design 1  Total score 30     6CIT Screen 03/14/2018  What Year? 0 points  What month? 0 points  What time? 0 points  Count back from 20 0 points  Months in reverse 0 points  Repeat phrase 0 points  Total Score 0    Immunization History  Administered Date(s) Administered  . Influenza Split 07/02/2014, 07/08/2015, 05/20/2016  . Influenza Whole 06/11/2012  . Pneumococcal Conjugate-13 12/24/2013  . Pneumococcal Polysaccharide-23 09/16/2011, 11/09/2015  . Tdap 02/07/2013  . Zoster 07/26/2013  . Zoster Recombinat (Shingrix) 05/15/2017, 07/21/2017   Screening Tests Health Maintenance  Topic Date Due  . INFLUENZA VACCINE  04/19/2018  . TETANUS/TDAP  02/08/2023  . DEXA SCAN  Completed  . PNA vac Low Risk Adult  Completed      Plan:    End of life planning; Advance aging; Advanced directives discussed. Copy of current HCPOA/Living Will requested.    I have personally reviewed and noted the following in the patient's chart:   . Medical and social history . Use of alcohol, tobacco or illicit drugs  . Current medications and supplements . Functional ability and status . Nutritional status . Physical activity . Advanced directives . List of other physicians . Hospitalizations, surgeries, and ER visits in previous 12 months . Vitals . Screenings to include cognitive, depression, and falls . Referrals and appointments  In addition, I have reviewed and discussed with patient certain preventive protocols, quality metrics, and best practice recommendations. A written personalized care plan for preventive services as well as general preventive health recommendations were provided to patient.      OBrien-Blaney, Rakin Lemelle L, LPN  9/32/3557    I have reviewed the above information and agree with above.   Deborra Medina, MD

## 2018-03-21 ENCOUNTER — Encounter: Payer: Self-pay | Admitting: Internal Medicine

## 2018-03-21 ENCOUNTER — Ambulatory Visit (INDEPENDENT_AMBULATORY_CARE_PROVIDER_SITE_OTHER): Payer: Medicare Other | Admitting: Internal Medicine

## 2018-03-21 VITALS — BP 134/76 | HR 74 | Resp 16 | Ht 61.0 in | Wt 129.0 lb

## 2018-03-21 DIAGNOSIS — J449 Chronic obstructive pulmonary disease, unspecified: Secondary | ICD-10-CM | POA: Diagnosis not present

## 2018-03-21 DIAGNOSIS — I6523 Occlusion and stenosis of bilateral carotid arteries: Secondary | ICD-10-CM

## 2018-03-21 MED ORDER — FLUTICASONE PROPIONATE 50 MCG/ACT NA SUSP: 2.0000 | Freq: Every day | NASAL | 2 refills | Status: DC

## 2018-03-21 MED ORDER — BENZONATATE 100 MG PO CAPS: 200.0000 mg | ORAL_CAPSULE | Freq: Three times a day (TID) | ORAL | 2 refills | Status: DC

## 2018-03-21 NOTE — Progress Notes (Signed)
PROBLEMS: Former smoker Mild COPD - very well compensated  INTERVAL HISTORY: Hospitalized 04/04-04/06/18 after syncopal event. Discharge diagnosis was COPD exacerbation. Patient was not seen by pulmonary service.  SUBJ: The patient is a 84 year old female, last saw Dr. Alva Garnet approximately 1 year ago, she was seen about a month ago by me as an urgent visit for cough which followed a recent chest infection, for which she had been treated with a course of prednisone, Tessalon, doxycycline.  After her last visit she was doing better, and she was started on Bevespi, however she returns now with a recurrence of cough that started about a week ago. She went to her PCP, got a course of antibiotic and tessalon perles with little relief.  She is taking bevespi 2 puffs once daily.  She denies reflux, she does have sinus drainage.  The cough does not wake her at night.  Denies hemoptysis, or weight loss.    **CXR 02/15/18; hyperinflation consistent with emphysema, changes of chronic bronchitis.  Review of Systems:  Constitutional: Feels well. Cardiovascular: No chest pain.  Pulmonary: Denies dyspnea.   The remainder of systems were reviewed and were found to be negative other than what is documented in the HPI.      OBJ: Vitals:   03/21/18 1059  BP: 134/76  Pulse: 74  Resp: 16  SpO2: 93%  Weight: 129 lb (58.5 kg)  Height: 5\' 1"  (1.549 m)    Physical Examination:   VS: BP 134/76 (BP Location: Left Arm, Cuff Size: Normal)   Pulse 74   Resp 16   Ht 5\' 1"  (1.549 m)   Wt 129 lb (58.5 kg)   SpO2 93%   BMI 24.37 kg/m   General Appearance: No distress  Neuro:without focal findings, mental status, speech normal, alert and oriented HEENT: PERRLA, EOM intact Pulmonary: No wheezing, No rales  CardiovascularNormal S1,S2.  No m/r/g.  Abdomen: Benign, Soft, non-tender, No masses Renal:  No costovertebral tenderness  GU:  No performed at this time. Endoc: No evident thyromegaly, no signs  of acromegaly or Cushing features Skin:   warm, no rashes, no ecchymosis  Extremities: normal, no cyanosis, clubbing.      DATA: CXR 12/21/16: No acute cardiac or pulmonary findings.  IMPRESSION: COPD with acute bronchitis and cough.     PLAN: Continue Bevespi.  Use combivent as needed.  Use tesslaon and mucinex three times daily.  Start flonase daily.  Call back in 6 weeks if not feeling better. At that time may consider nebulizer, steroid taper or CT chest.  Otherwise follow up with Dr. Alva Garnet in 3 months.   Meds ordered this encounter  Medications  . benzonatate (TESSALON PERLES) 100 MG capsule    Sig: Take 2 capsules (200 mg total) by mouth 3 (three) times daily.    Dispense:  90 capsule    Refill:  2  . fluticasone (FLONASE) 50 MCG/ACT nasal spray    Sig: Place 2 sprays into both nostrils daily.    Dispense:  16 g    Refill:  2   Return for with Dr. Alva Garnet in 3 months. Marda Stalker, M.D., F.C.C.P.  Board Certified in Internal Medicine, Pulmonary Medicine, [place], and Sleep Medicine.  [facility] Office Number: [phone]   03/21/2018 11:15 AM

## 2018-03-21 NOTE — Patient Instructions (Addendum)
Take tessalon.  Takes mucinex-DM three times per day.  Will start nasal spray 2 sprays in each nostril daily.

## 2018-03-26 ENCOUNTER — Telehealth: Payer: Self-pay | Admitting: *Deleted

## 2018-03-26 NOTE — Telephone Encounter (Signed)
Copied from [place] [id number]. Topic: General - Other >> Mar 26, 2018 12:26 PM Marin Olp L wrote: Reason for CRM: Patient would like shingles vaccine and wants to know if it's available

## 2018-03-27 NOTE — Telephone Encounter (Signed)
LMTCB. Need to let pt know that since she has medicare she cannot get the shingles vaccine in the office but Medicare will not pay for it in the office. She will need to get the shingles vaccine at a pharmacy.

## 2018-03-30 ENCOUNTER — Telehealth: Payer: Self-pay | Admitting: Cardiovascular Disease

## 2018-03-30 ENCOUNTER — Emergency Department
Admission: EM | Admit: 2018-03-30 | Discharge: 2018-03-30 | Disposition: A | Discharge status: Home / Self Care | Payer: Medicare Other | Attending: Emergency Medicine | Admitting: Emergency Medicine

## 2018-03-30 ENCOUNTER — Other Ambulatory Visit: Payer: Self-pay

## 2018-03-30 ENCOUNTER — Emergency Department: Payer: Medicare Other

## 2018-03-30 ENCOUNTER — Encounter: Payer: Self-pay | Admitting: Emergency Medicine

## 2018-03-30 DIAGNOSIS — R0789 Other chest pain: Secondary | ICD-10-CM | POA: Diagnosis present

## 2018-03-30 DIAGNOSIS — Z7982 Long term (current) use of aspirin: Secondary | ICD-10-CM | POA: Diagnosis not present

## 2018-03-30 DIAGNOSIS — I2510 Atherosclerotic heart disease of native coronary artery without angina pectoris: Secondary | ICD-10-CM | POA: Diagnosis not present

## 2018-03-30 DIAGNOSIS — J449 Chronic obstructive pulmonary disease, unspecified: Secondary | ICD-10-CM | POA: Diagnosis not present

## 2018-03-30 DIAGNOSIS — Z951 Presence of aortocoronary bypass graft: Secondary | ICD-10-CM | POA: Diagnosis not present

## 2018-03-30 DIAGNOSIS — R531 Weakness: Secondary | ICD-10-CM | POA: Insufficient documentation

## 2018-03-30 DIAGNOSIS — I10 Essential (primary) hypertension: Secondary | ICD-10-CM | POA: Insufficient documentation

## 2018-03-30 DIAGNOSIS — Z87891 Personal history of nicotine dependence: Secondary | ICD-10-CM | POA: Insufficient documentation

## 2018-03-30 DIAGNOSIS — Z7901 Long term (current) use of anticoagulants: Secondary | ICD-10-CM | POA: Insufficient documentation

## 2018-03-30 DIAGNOSIS — R079 Chest pain, unspecified: Secondary | ICD-10-CM | POA: Diagnosis not present

## 2018-03-30 DIAGNOSIS — Z79899 Other long term (current) drug therapy: Secondary | ICD-10-CM | POA: Diagnosis not present

## 2018-03-30 LAB — CBC WITH DIFFERENTIAL/PLATELET
BASOS ABS: 0.1 10*3/uL (ref 0–0.1)
BASOS PCT: 1 %
EOS ABS: 0.1 10*3/uL (ref 0–0.7)
EOS PCT: 2 %
HCT: 42.4 % (ref 35.0–47.0)
HEMOGLOBIN: 14.4 g/dL (ref 12.0–16.0)
LYMPHS ABS: 1.3 10*3/uL (ref 1.0–3.6)
Lymphocytes Relative: 18 %
MCH: 33.4 pg (ref 26.0–34.0)
MCHC: 33.9 g/dL (ref 32.0–36.0)
MCV: 98.4 fL (ref 80.0–100.0)
Monocytes Absolute: 1 10*3/uL — ABNORMAL HIGH (ref 0.2–0.9)
Monocytes Relative: 13 %
NEUTROS PCT: 66 %
Neutro Abs: 5.1 10*3/uL (ref 1.4–6.5)
PLATELETS: 180 10*3/uL (ref 150–440)
RBC: 4.3 MIL/uL (ref 3.80–5.20)
RDW: 12.8 % (ref 11.5–14.5)
WBC: 7.6 10*3/uL (ref 3.6–11.0)

## 2018-03-30 LAB — URINALYSIS, COMPLETE (UACMP) WITH MICROSCOPIC
BACTERIA UA: NONE SEEN
BILIRUBIN URINE: NEGATIVE
GLUCOSE, UA: NEGATIVE mg/dL
HGB URINE DIPSTICK: NEGATIVE
KETONES UR: NEGATIVE mg/dL
LEUKOCYTES UA: NEGATIVE
NITRITE: NEGATIVE
PH: 7 (ref 5.0–8.0)
Protein, ur: NEGATIVE mg/dL
SPECIFIC GRAVITY, URINE: 1.005 (ref 1.005–1.030)
Squamous Epithelial / LPF: NONE SEEN (ref 0–5)

## 2018-03-30 LAB — BASIC METABOLIC PANEL
Anion gap: 8 (ref 5–15)
BUN: 24 mg/dL — AB (ref 8–23)
CHLORIDE: 107 mmol/L (ref 98–111)
CO2: 28 mmol/L (ref 22–32)
CREATININE: 1.27 mg/dL — AB (ref 0.44–1.00)
Calcium: 9 mg/dL (ref 8.9–10.3)
GFR calc Af Amer: 42 mL/min — ABNORMAL LOW (ref >60)
GFR, EST NON AFRICAN AMERICAN: 36 mL/min — AB (ref >60)
Glucose, Bld: 120 mg/dL — ABNORMAL HIGH (ref 70–99)
POTASSIUM: 4.8 mmol/L (ref 3.5–5.1)
SODIUM: 143 mmol/L (ref 135–145)

## 2018-03-30 LAB — TROPONIN I

## 2018-03-30 MED ORDER — SODIUM CHLORIDE 0.9 % IV BOLUS
1000.0000 mL | Freq: Once | INTRAVENOUS | Status: AC
  Administered 2018-03-30: 1000 mL via INTRAVENOUS

## 2018-03-30 NOTE — ED Notes (Signed)
Pt back in room from XR 

## 2018-03-30 NOTE — ED Notes (Signed)
ED Provider at bedside. 

## 2018-03-30 NOTE — Telephone Encounter (Signed)
New message  Pt returning a call.

## 2018-03-30 NOTE — ED Triage Notes (Addendum)
Pt arrives via [facility] from home with complaints of chest pain that began this morning while doing her crossword puzzle as well as weakness that began yesterday. Pt currently denying chest pain. A&O. Pt reports that she just got over the cough that she had when she was dx with bronchitis "about a month ago." Stated her appetite is back from that and has been eating and drinking like normal.   Pt took 2 nitro PO at home with a baby aspirin each time. Pt also took 4 baby aspirin upon EMS arrival. Total of 6-81mg  aspirin.

## 2018-03-30 NOTE — ED Notes (Signed)
Patient transported to XR. 

## 2018-03-30 NOTE — ED Provider Notes (Signed)
[facility] Emergency Department Provider Note  ____________________________________________  Time seen: Approximately 1:27 PM  I have reviewed the triage vital signs and the nursing notes.   HISTORY  Chief Complaint Chest Pain and Weakness    HPI [name] is a 84 y.o. female with a history of CAD, GERD, kidney stones, COPD who complains of fatigue and generalized weakness today.   Also this morning she started having chest pain described as aching, intermittent lasting a few seconds at a time in the left mid anterior chest.  Nonradiating.  No aggravating or alleviating factors, not associated with shortness of breath diaphoresis or vomiting.  Currently resolved.  Not relieved by taking nitroglycerin at home.  She is recently been treated for bronchitis.     Past Medical History:  Diagnosis Date  . C. difficile colitis   . CAD (coronary artery disease)    a. Nuclear 10/11, not gated, no scar or ischemia, EF 68%, low risk study; b. [place] 06/18/14: no evidence of infarction or ischemia, no WMA, normalization of TWI in precordial leads, equivocal EKG changes EF 77%, low risk study  . Carotid artery disease ([place])    Doppler January, 2012, 40-59% bilateral, followup one year  . COPD (chronic obstructive pulmonary disease) ([place])   . Diffuse cystic mastopathy   . Diverticulitis    last colonoscopy  incomplete Jan 2011  . Dizziness    stable now (Oct 2011) patient tells me question of TIA, we will obtain records  . Gait abnormality    Patient complains of "wobbly gait", December, 2013  . GERD (gastroesophageal reflux disease)   . History of migraines   . Hx of CABG    a. 3 vessel in 1999  . Hyperlipidemia   . Hypertension   . Indigestion    3 weeks, October 2011  . Kidney stones   . Rheumatic fever   . Sinus bradycardia    Mild, December, 2013  . SOB (shortness of breath)   . Syncopal episodes    after 18 holes of golf and increase  hydrochlorothiazide, resolved      Patient Active Problem List   Diagnosis Date Noted  . Melena 02/03/2018  . Hearing loss secondary to cerumen impaction 12/26/2017  . S/P excision of skin lesion, follow-up exam 12/26/2017  . PNA (pneumonia) 09/07/2017  . Closed fracture pubis ([place]) 07/04/2017  . Impingement syndrome of left shoulder region 07/04/2017  . Neck pain 07/04/2017  . Pelvic fracture ([place]) 03/08/2017  . Allergic rhinitis 12/31/2016  . Hospital discharge follow-up 12/31/2016  . Change in bowel movement 08/20/2016  . SCC (squamous cell carcinoma), face 08/01/2016  . EKG abnormalities 06/17/2016  . Osteoporosis of forearm 02/13/2016  . Dizziness and giddiness 02/13/2016  . Other specified symptoms and signs involving the circulatory and respiratory systems 01/31/2016  . Insomnia secondary to anxiety 07/12/2015  . Anxiety disorder 07/12/2015  . Abnormal weight loss 02/09/2015  . Essential (primary) hypertension 10/07/2014  . Syncope and collapse   . Sinus bradycardia   . Hardening of the aorta (main artery of the heart) ([place]) 06/26/2014  . History of Clostridium difficile colitis 10/20/2013  . Personal history of other diseases of the digestive system 10/20/2013  . Enterocolitis due to Clostridium difficile 10/20/2013  . Low back pain 10/04/2013  . Encounter for general adult medical examination without abnormal findings 02/07/2013  . Chest pain at rest 12/26/2011  . Chronic obstructive pulmonary disease ([place]) 12/05/2011  . Presence of  aortocoronary bypass graft   . Chronic kidney disease, stage III (moderate) ([place]) 09/25/2011  . Hyperlipidemia   . Hypertension   . SOB (shortness of breath)   . Coronary artery disease of native artery of native heart with stable angina pectoris ([place])   . Carotid artery disease [facility])      Past Surgical History:  Procedure Laterality Date  . APPENDECTOMY  1950  . BREAST BIOPSY Right 1994  . BREAST CYST ASPIRATION     multiple  BIL  . CATARACT EXTRACTION Right 2009  . CATARACT EXTRACTION Left 2011  . COLONOSCOPY  [id number]   Dr. Jamal Collin  . CORONARY ARTERY BYPASS GRAFT  1999  . esophagus stretched    . TONSILLECTOMY  1939  . TOTAL ABDOMINAL HYSTERECTOMY  1968   due to metrorrhagia     Prior to Admission medications   Medication Sig Start Date End Date Taking? Authorizing Provider  amLODipine (NORVASC) 2.5 MG tablet TAKE ONE TABLET EVERY DAY 12/18/17  Yes [name], MD  aspirin 81 MG tablet Take 81 mg by mouth daily.     Yes [provider]  clopidogrel (PLAVIX) 75 MG tablet TAKE 1 TABLET BY MOUTH ONE TIME DAILY 12/25/17  Yes [name], MD  COMBIVENT RESPIMAT 20-100 MCG/ACT AERS respimat INHALE ONE PUFF EVERY SIX HOURS 02/08/18  Yes [name], MD  escitalopram (LEXAPRO) 10 MG tablet Take 1 tablet (10 mg total) by mouth daily. 11/21/17  Yes [name], MD  esomeprazole (NEXIUM) 40 MG capsule TAKE 1 CAPSULE DAILY BEFORE BREAKFAST 05/09/13  Yes [name], MD  fluticasone (FLONASE) 50 MCG/ACT nasal spray Place 2 sprays into both nostrils daily. 03/21/18  Yes Laverle Hobby, MD  Glycopyrrolate-Formoterol (BEVESPI AEROSPHERE) 9-4.8 MCG/ACT AERO Inhale 2 puffs into the lungs daily. 02/22/18  Yes Laverle Hobby, MD  LORazepam (ATIVAN) 0.5 MG tablet Take 1 tablet (0.5 mg total) by mouth at bedtime. As needed for insomnia 02/20/18  Yes [name], MD  losartan (COZAAR) 100 MG tablet Take 1 tablet (100 mg total) by mouth daily. 12/25/17  Yes [name], MD  nitroGLYCERIN (NITROSTAT) 0.4 MG SL tablet DISSOLVE 1 TABLET UNDER TONGUE EVERY FIVE MINUTES UP TO 3 DOSES AS NEEDED 01/26/18  Yes Gollan, Kathlene November, MD  simvastatin (ZOCOR) 40 MG tablet TAKE ONE TABLET AT BEDTIME 11/06/17  Yes Gollan, Kathlene November, MD  benzonatate (TESSALON PERLES) 100 MG capsule Take 2 capsules (200 mg total) by mouth 3 (three) times daily. Patient not taking: Reported on 03/30/2018 03/21/18 03/21/19  Laverle Hobby, MD     Allergies Codeine   Family History  Problem Relation Age of Onset  . Heart disease Mother   . Heart disease Father   . Cancer Neg Hx   . Drug abuse Neg Hx   . Breast cancer Neg Hx     Social History Social History   Tobacco Use  . Smoking status: Former Smoker    Packs/day: 0.50    Years: 40.00    Pack years: 20.00    Types: Cigarettes    Last attempt to quit: 09/20/1983    Years since quitting: 34.5  . Smokeless tobacco: Never Used  Substance Use Topics  . Alcohol use: Yes    Comment: yes, social wine  . Drug use: No    Review of Systems  Constitutional:   No fever or chills.  ENT:   No sore throat. No rhinorrhea. Cardiovascular:   As above  as above chest pain without syncope. Respiratory:   No dyspnea or cough. Gastrointestinal:   Negative for abdominal pain, vomiting and diarrhea.  No dysuria Musculoskeletal:   Negative for focal pain or swelling All other systems reviewed and are negative except as documented above in ROS and HPI.  ____________________________________________   PHYSICAL EXAM:  VITAL SIGNS: ED Triage Vitals  Enc Vitals Group     BP 03/30/18 1056 (!) 169/64     Pulse Rate 03/30/18 1056 79     Resp 03/30/18 1056 16     Temp 03/30/18 1056 98 F (36.7 C)     Temp Source 03/30/18 1056 Oral     SpO2 03/30/18 1056 98 %     Weight 03/30/18 1057 130 lb (59 kg)     Height 03/30/18 1057 5\' 1"  (1.549 m)     Head Circumference --      Peak Flow --      Pain Score 03/30/18 1057 0     Pain Loc --      Pain Edu? --      Excl. in [place]? --     Vital signs reviewed, nursing assessments reviewed.   Constitutional:   Alert and oriented. Non-toxic appearance. Eyes:   Conjunctivae are normal. EOMI. PERRL. ENT      Head:   Normocephalic and atraumatic.      Nose:   No congestion/rhinnorhea.       Mouth/Throat:   MMM, no pharyngeal erythema. No peritonsillar mass.       Neck:   No meningismus. Full  ROM. Hematological/Lymphatic/Immunilogical:   No cervical lymphadenopathy. Cardiovascular:   RRR. Symmetric bilateral radial and DP pulses.  No murmurs. Cap refill less than 2 seconds. Respiratory:   Normal respiratory effort without tachypnea/retractions. Breath sounds are clear and equal bilaterally. No wheezes/rales/rhonchi. Gastrointestinal:   Soft and nontender. Non distended. There is no CVA tenderness.  No rebound, rigidity, or guarding.  Musculoskeletal:   Normal range of motion in all extremities. No joint effusions.  No lower extremity tenderness.  No edema.  Chest wall nontender Neurologic:   Normal speech and language.  Motor grossly intact. No acute focal neurologic deficits are appreciated.  Skin:    Skin is warm, dry and intact. No rash noted.  No petechiae, purpura, or bullae.  ____________________________________________    LABS (pertinent positives/negatives) (all labs ordered are listed, but only abnormal results are displayed) Labs Reviewed  BASIC METABOLIC PANEL - Abnormal; Notable for the following components:      Result Value   Glucose, Bld 120 (*)    BUN 24 (*)    Creatinine, Ser 1.27 (*)    GFR calc non Af Amer 36 (*)    GFR calc Af Amer 42 (*)    All other components within normal limits  CBC WITH DIFFERENTIAL/PLATELET - Abnormal; Notable for the following components:   Monocytes Absolute 1.0 (*)    All other components within normal limits  URINALYSIS, COMPLETE (UACMP) WITH MICROSCOPIC - Abnormal; Notable for the following components:   Color, Urine STRAW (*)    APPearance CLEAR (*)    All other components within normal limits  URINE CULTURE  TROPONIN I  TROPONIN I   ____________________________________________   EKG  Interpreted by me Sinus rhythm rate of 61, normal axis and intervals.  Normal QRS and ST segments.  Slight T wave inversion in V3, nonspecific  ____________________________________________    RADIOLOGY  Dg Chest 2  View  Result Date:  03/30/2018 CLINICAL DATA:  Left chest pain EXAM: CHEST - 2 VIEW COMPARISON:  02/15/2018 FINDINGS: Postop CABG.  Negative for heart failure COPD. Pulmonary hyperinflation with diffuse pulmonary scarring. Apical scarring bilaterally. Negative for heart failure or pneumonia. Moderately large hiatal hernia IMPRESSION: COPD without acute cardiopulmonary abnormality Hiatal hernia Electronically Signed   By: Franchot Gallo M.D.   On: 03/30/2018 11:41    ____________________________________________   PROCEDURES Procedures  ____________________________________________  DIFFERENTIAL DIAGNOSIS   Pleurisy, chest wall strain, non-STEMI, ptx  CLINICAL IMPRESSION / ASSESSMENT AND PLAN / ED COURSE  Pertinent labs & imaging results that were available during my care of the patient were reviewed by me and considered in my medical decision making (see chart for details).    Non toxic. VS unremarkable. Doubt PE/dissection/UA. Will check labs, cxr. IVF for hydration given recent illness with weight loss from decreased PO intake.   Clinical Course as of Mar 30 1512  Fri Mar 30, 2018  1232 Labs at baseline, unremarkable.  Chest x-ray unremarkable with underlying COPD and hiatal hernia.  I will check a second troponin.   [PS]  1433 UA normal.  Second troponin negative.  Plan to discharge home to follow-up with primary care and cardiology.   [PS]  3953 Patient updated, will follow up with her primary care doctor in 3 days.  She does feel better after IV fluids.   [PS]    Clinical Course User Index [PS] Carrie Mew, MD     ____________________________________________   FINAL CLINICAL IMPRESSION(S) / ED DIAGNOSES    Final diagnoses:  Generalized weakness  Atypical chest pain     ED Discharge Orders    None      Portions of this note were generated with dragon dictation software. Dictation errors may occur despite best attempts at proofreading.    Carrie Mew, MD 03/30/18 [phone]

## 2018-03-31 LAB — URINE CULTURE: Culture: NO GROWTH

## 2018-04-02 NOTE — Telephone Encounter (Signed)
Lmov for patient to schedule ED fu   Will try again at a later time

## 2018-04-03 NOTE — Telephone Encounter (Signed)
Pt states she was seen in ED but it was not her Heart  She states they told her she was just Dehydrated   Would not need appointment at the time

## 2018-04-04 ENCOUNTER — Ambulatory Visit: Payer: Self-pay | Admitting: *Deleted

## 2018-04-04 NOTE — Telephone Encounter (Signed)
Patient called and states that she went to the ED and was dehydrated. She thought she was fine, but she got up this am and was very dizzy. She took her BP and it was 173/83. Patient is wanting to talk to a nurse. CB# [phone]  Patient is calling to report she was doing better since discharge from hospital- she woke this morning feeling dizzy and unsteady on her feet. Her BP reading is 176/126.Patient advised to go to [facility] or call cardiologist for appointment today - no availability at PCP.  Reason for Disposition . [1] Dizziness caused by heat exposure, sudden standing, or poor fluid intake AND [2] no improvement after 2 hours of rest and fluids  Answer Assessment - Initial Assessment Questions 1. DESCRIPTION: "Describe your dizziness."     Patient was doing OK since she has been home- she woke up this morning and she is dizziness- unsteady 2. LIGHTHEADED: "Do you feel lightheaded?" (e.g., somewhat faint, woozy, weak upon standing)     Dizzy- unsteady on feet 3. VERTIGO: "Do you feel like either you or the room is spinning or tilting?" (i.e. vertigo)     no 4. SEVERITY: "How bad is it?"  "Do you feel like you are going to faint?" "Can you stand and walk?"   - MILD - walking normally   - MODERATE - interferes with normal activities (e.g., work, school)    - SEVERE - unable to stand, requires support to walk, feels like passing out now.      moderate 5. ONSET:  "When did the dizziness begin?"     today 6. AGGRAVATING FACTORS: "Does anything make it worse?" (e.g., standing, change in head position)     no 7. HEART RATE: "Can you tell me your heart rate?" "How many beats in 15 seconds?"  (Note: not all patients can do this)       P 69 8. CAUSE: "What do you think is causing the dizziness?"     High BP 9. RECURRENT SYMPTOM: "Have you had dizziness before?" If so, ask: "When was the last time?" "What happened that time?"     no 10. OTHER SYMPTOMS: "Do you have any other symptoms?" (e.g.,  fever, chest pain, vomiting, diarrhea, bleeding)       no 11. PREGNANCY: "Is there any chance you are pregnant?" "When was your last menstrual period?"       n/a  Protocols used: DIZZINESS [facility]

## 2018-04-04 NOTE — Telephone Encounter (Signed)
Patient was triaged by [facility] and informed to go to Urgent care or ED due to provider being fully scheduled.

## 2018-04-05 DIAGNOSIS — R42 Dizziness and giddiness: Secondary | ICD-10-CM | POA: Diagnosis not present

## 2018-04-09 ENCOUNTER — Ambulatory Visit: Payer: Medicare Other

## 2018-04-17 DIAGNOSIS — R42 Dizziness and giddiness: Secondary | ICD-10-CM | POA: Diagnosis not present

## 2018-04-17 DIAGNOSIS — I10 Essential (primary) hypertension: Secondary | ICD-10-CM | POA: Diagnosis not present

## 2018-04-17 DIAGNOSIS — H903 Sensorineural hearing loss, bilateral: Secondary | ICD-10-CM | POA: Diagnosis not present

## 2018-04-17 NOTE — Telephone Encounter (Signed)
Have attempted to call pt several times with no return phone calls.

## 2018-04-18 ENCOUNTER — Telehealth: Payer: Self-pay

## 2018-04-18 NOTE — Telephone Encounter (Signed)
Copied from [place] [id number]. Topic: Quick Communication - Office Called Patient >> Apr 17, 2018  5:30 PM Neva Seat wrote: Pt returned Jessica's call.  Please call pt back.

## 2018-04-30 NOTE — Telephone Encounter (Signed)
Patient seen at UC

## 2018-05-10 DIAGNOSIS — Z85828 Personal history of other malignant neoplasm of skin: Secondary | ICD-10-CM | POA: Diagnosis not present

## 2018-05-10 DIAGNOSIS — Z08 Encounter for follow-up examination after completed treatment for malignant neoplasm: Secondary | ICD-10-CM | POA: Diagnosis not present

## 2018-05-10 DIAGNOSIS — C44722 Squamous cell carcinoma of skin of right lower limb, including hip: Secondary | ICD-10-CM | POA: Diagnosis not present

## 2018-05-10 DIAGNOSIS — D485 Neoplasm of uncertain behavior of skin: Secondary | ICD-10-CM | POA: Diagnosis not present

## 2018-05-16 DIAGNOSIS — C44722 Squamous cell carcinoma of skin of right lower limb, including hip: Secondary | ICD-10-CM | POA: Diagnosis not present

## 2018-05-16 DIAGNOSIS — L820 Inflamed seborrheic keratosis: Secondary | ICD-10-CM | POA: Diagnosis not present

## 2018-05-16 DIAGNOSIS — R58 Hemorrhage, not elsewhere classified: Secondary | ICD-10-CM | POA: Diagnosis not present

## 2018-05-30 ENCOUNTER — Other Ambulatory Visit: Payer: Self-pay | Admitting: Internal Medicine

## 2018-05-30 DIAGNOSIS — Z23 Encounter for immunization: Secondary | ICD-10-CM | POA: Diagnosis not present

## 2018-06-13 DIAGNOSIS — C44722 Squamous cell carcinoma of skin of right lower limb, including hip: Secondary | ICD-10-CM | POA: Diagnosis not present

## 2018-07-02 ENCOUNTER — Ambulatory Visit: Payer: Medicare Other | Admitting: Internal Medicine

## 2018-07-02 ENCOUNTER — Other Ambulatory Visit: Payer: Self-pay | Admitting: Internal Medicine

## 2018-07-10 ENCOUNTER — Ambulatory Visit (INDEPENDENT_AMBULATORY_CARE_PROVIDER_SITE_OTHER): Payer: Medicare Other | Admitting: Internal Medicine

## 2018-07-10 ENCOUNTER — Encounter: Payer: Self-pay | Admitting: Internal Medicine

## 2018-07-10 DIAGNOSIS — I6523 Occlusion and stenosis of bilateral carotid arteries: Secondary | ICD-10-CM | POA: Diagnosis not present

## 2018-07-10 DIAGNOSIS — Z0000 Encounter for general adult medical examination without abnormal findings: Secondary | ICD-10-CM

## 2018-07-10 DIAGNOSIS — F419 Anxiety disorder, unspecified: Secondary | ICD-10-CM | POA: Diagnosis not present

## 2018-07-10 DIAGNOSIS — F5105 Insomnia due to other mental disorder: Secondary | ICD-10-CM | POA: Diagnosis not present

## 2018-07-10 DIAGNOSIS — I10 Essential (primary) hypertension: Secondary | ICD-10-CM

## 2018-07-10 MED ORDER — LORAZEPAM 0.5 MG PO TABS: 0.7500 mg | ORAL_TABLET | Freq: Every day | ORAL | 5 refills | Status: DC

## 2018-07-10 NOTE — Progress Notes (Signed)
Subjective:  Patient ID: ELLISHA Allen, female    DOB: 1926/07/10  Age: 84 y.o. MRN: [id number]  CC: Diagnoses of Encounter for general adult medical examination without abnormal findings, Essential hypertension, and Insomnia secondary to anxiety were pertinent to this visit.  HPI [name] presents for 6 month follow up   Feeling good. Has resumed playing bridge and golf.  Back and peliic pain not constant.  Bowels moving normally.  Energy level is good    Not sleeping well despite  Lorazepam 0.5 mg .  Has trouble initiating sleep.    celebrating 89th birthday ne xt Monday     Outpatient Medications Prior to Visit  Medication Sig Dispense Refill  . amLODipine (NORVASC) 2.5 MG tablet TAKE ONE TABLET EVERY DAY 90 tablet 1  . aspirin 81 MG tablet Take 81 mg by mouth daily.      . clopidogrel (PLAVIX) 75 MG tablet TAKE ONE TABLET EVERY DAY 90 tablet 2  . COMBIVENT RESPIMAT 20-100 MCG/ACT AERS respimat INHALE ONE PUFF EVERY SIX HOURS 4 g 2  . esomeprazole (NEXIUM) 40 MG capsule TAKE 1 CAPSULE DAILY BEFORE BREAKFAST 90 capsule 1  . losartan (COZAAR) 100 MG tablet TAKE ONE TABLET EVERY DAY 90 tablet 2  . nitroGLYCERIN (NITROSTAT) 0.4 MG SL tablet DISSOLVE 1 TABLET UNDER TONGUE EVERY FIVE MINUTES UP TO 3 DOSES AS NEEDED 25 tablet 1  . simvastatin (ZOCOR) 40 MG tablet TAKE ONE TABLET AT BEDTIME 90 tablet 3  . LORazepam (ATIVAN) 0.5 MG tablet Take 1 tablet (0.5 mg total) by mouth at bedtime. As needed for insomnia 30 tablet 5  . escitalopram (LEXAPRO) 10 MG tablet Take 1 tablet (10 mg total) by mouth daily. (Patient not taking: Reported on 07/10/2018) 30 tablet 2  . fluticasone (FLONASE) 50 MCG/ACT nasal spray Place 2 sprays into both nostrils daily. (Patient not taking: Reported on 07/10/2018) 16 g 2  . Glycopyrrolate-Formoterol (BEVESPI AEROSPHERE) 9-4.8 MCG/ACT AERO Inhale 2 puffs into the lungs daily. (Patient not taking: Reported on 07/10/2018) 1 Inhaler 0  . benzonatate  (TESSALON PERLES) 100 MG capsule Take 2 capsules (200 mg total) by mouth 3 (three) times daily. (Patient not taking: Reported on 03/30/2018) 90 capsule 2   No facility-administered medications prior to visit.     Review of Systems;  Patient denies headache, fevers, malaise, unintentional weight loss, skin rash, eye pain, sinus congestion and sinus pain, sore throat, dysphagia,  hemoptysis , cough, dyspnea, wheezing, chest pain, palpitations, orthopnea, edema, abdominal pain, nausea, melena, diarrhea, constipation, flank pain, dysuria, hematuria, urinary  Frequency, nocturia, numbness, tingling, seizures,  Focal weakness, Loss of consciousness,  Tremor, insomnia, depression, anxiety, and suicidal ideation.      Objective:  BP 140/78 (BP Location: Left Arm, Patient Position: Sitting, Cuff Size: Normal)   Pulse 68   Temp 98.4 F (36.9 C) (Oral)   Resp 15   Ht 5\' 1"  (1.549 m)   Wt 131 lb 9.6 oz (59.7 kg)   SpO2 97%   BMI 24.87 kg/m   BP Readings from Last 3 Encounters:  07/10/18 140/78  03/30/18 (!) 143/60  03/21/18 134/76    Wt Readings from Last 3 Encounters:  07/10/18 131 lb 9.6 oz (59.7 kg)  03/30/18 130 lb (59 kg)  03/21/18 129 lb (58.5 kg)    General appearance: alert, cooperative and appears stated age Ears: normal TM's and external ear canals both ears Throat: lips, mucosa, and tongue normal; teeth and gums normal Neck: no  adenopathy, no carotid bruit, supple, symmetrical, trachea midline and thyroid not enlarged, symmetric, no tenderness/mass/nodules Back: symmetric, no curvature. ROM normal. No CVA tenderness. Lungs: clear to auscultation bilaterally Heart: regular rate and rhythm, S1, S2 normal, no murmur, click, rub or gallop Abdomen: soft, non-tender; bowel sounds normal; no masses,  no organomegaly Pulses: 2+ and symmetric Skin: Skin color, texture, turgor normal. No rashes or lesions Lymph nodes: Cervical, supraclavicular, and axillary nodes normal.  Lab  Results  Component Value Date   HGBA1C 5.8 01/30/2017   HGBA1C 5.8 09/26/2016    Lab Results  Component Value Date   CREATININE 1.27 (H) 03/30/2018   CREATININE 1.25 (H) 01/31/2018   CREATININE 1.31 (H) 09/21/2017    Lab Results  Component Value Date   WBC 7.6 03/30/2018   HGB 14.4 03/30/2018   HCT 42.4 03/30/2018   PLT 180 03/30/2018   GLUCOSE 120 (H) 03/30/2018   CHOL 109 12/08/2016   TRIG 122.0 12/08/2016   HDL 31.30 (L) 12/08/2016   LDLDIRECT 72.0 11/09/2015   LDLCALC 53 12/08/2016   ALT 14 01/31/2018   AST 16 01/31/2018   NA 143 03/30/2018   K 4.8 03/30/2018   CL 107 03/30/2018   CREATININE 1.27 (H) 03/30/2018   BUN 24 (H) 03/30/2018   CO2 28 03/30/2018   TSH 1.44 02/09/2015   INR 0.9 01/31/2018   HGBA1C 5.8 01/30/2017   MICROALBUR 2.5 (H) 12/26/2011    Dg Chest 2 View  Result Date: 03/30/2018 CLINICAL DATA:  Left chest pain EXAM: CHEST - 2 VIEW COMPARISON:  02/15/2018 FINDINGS: Postop CABG.  Negative for heart failure COPD. Pulmonary hyperinflation with diffuse pulmonary scarring. Apical scarring bilaterally. Negative for heart failure or pneumonia. Moderately large hiatal hernia IMPRESSION: COPD without acute cardiopulmonary abnormality Hiatal hernia Electronically Signed   By: Franchot Gallo M.D.   On: 03/30/2018 11:41    Assessment & Plan:   Problem List Items Addressed This Visit    Encounter for general adult medical examination without abnormal findings    age appropriate education and counseling updated, referrals for preventative services and immunizations addressed, dietary and smoking counseling addressed, most recent labs reviewed.  I have personally reviewed and have noted:  1) the patient's medical and social history 2) The pt's use of alcohol, tobacco, and illicit drugs 3) The patient's current medications and supplements 4) Functional ability including ADL's, fall risk, home safety risk, hearing and visual impairment 5) Diet and physical  activities 6) Evidence for depression or mood disorder 7) The patient's height, weight, and BMI have been recorded in the chart  I have made referrals, and provided counseling and education based on review of the above      Hypertension    .Well controlled on current regimen. Renal function stable, no changes today.      Insomnia secondary to anxiety    Advised to increase lorazepam  To 0.75 mg       Relevant Medications   LORazepam (ATIVAN) 0.5 MG tablet     A total of 25 minutes of face to face time was spent with patient more than half of which was spent in counselling about the above mentioned conditions  and coordination of care  I have discontinued Kaylynn B. Tetrick's benzonatate. I am also having her maintain her aspirin, esomeprazole, simvastatin, escitalopram, nitroGLYCERIN, COMBIVENT RESPIMAT, Glycopyrrolate-Formoterol, fluticasone, amLODipine, losartan, clopidogrel, and LORazepam.  Meds ordered this encounter  Medications  . DISCONTD: LORazepam (ATIVAN) 0.5 MG tablet    Sig:  Take 1.5 tablets (0.75 mg total) by mouth at bedtime. As needed for insomnia    Dispense:  45 tablet    Refill:  5  . LORazepam (ATIVAN) 0.5 MG tablet    Sig: Take 1.5 tablets (0.75 mg total) by mouth at bedtime. As needed for insomnia    Dispense:  45 tablet    Refill:  5    Medications Discontinued During This Encounter  Medication Reason  . benzonatate (TESSALON PERLES) 100 MG capsule Patient has not taken in last 30 days  . LORazepam (ATIVAN) 0.5 MG tablet   . LORazepam (ATIVAN) 0.5 MG tablet Reorder    Follow-up: No follow-ups on file.   [name], MD

## 2018-07-10 NOTE — Patient Instructions (Addendum)
I want to try to improve your sleep.  I recommend that you increase your Ativan (lorazepam) to 1.5 tablets   Please call Dr Donivan Scull office to set up your 6 month follow up visit this month

## 2018-07-11 ENCOUNTER — Encounter: Payer: Self-pay | Admitting: Internal Medicine

## 2018-07-11 DIAGNOSIS — C44722 Squamous cell carcinoma of skin of right lower limb, including hip: Secondary | ICD-10-CM | POA: Diagnosis not present

## 2018-07-11 NOTE — Assessment & Plan Note (Signed)

## 2018-07-11 NOTE — Assessment & Plan Note (Signed)
Well controlled on current regimen. Renal function stable, no changes today. 

## 2018-07-11 NOTE — Assessment & Plan Note (Signed)
Advised to increase lorazepam  To 0.75 mg

## 2018-07-18 DIAGNOSIS — I96 Gangrene, not elsewhere classified: Secondary | ICD-10-CM | POA: Diagnosis not present

## 2018-07-23 ENCOUNTER — Ambulatory Visit (INDEPENDENT_AMBULATORY_CARE_PROVIDER_SITE_OTHER): Payer: Medicare Other | Admitting: Pulmonary Disease

## 2018-07-23 ENCOUNTER — Encounter: Payer: Self-pay | Admitting: Pulmonary Disease

## 2018-07-23 VITALS — BP 140/64 | HR 94 | Ht 61.0 in | Wt 131.8 lb

## 2018-07-23 DIAGNOSIS — J449 Chronic obstructive pulmonary disease, unspecified: Secondary | ICD-10-CM

## 2018-07-23 DIAGNOSIS — I6523 Occlusion and stenosis of bilateral carotid arteries: Secondary | ICD-10-CM | POA: Diagnosis not present

## 2018-07-23 MED ORDER — IPRATROPIUM-ALBUTEROL 20-100 MCG/ACT IN AERS: 1.0000 | INHALATION_SPRAY | Freq: Four times a day (QID) | RESPIRATORY_TRACT | 5 refills | Status: DC | PRN

## 2018-07-23 NOTE — Progress Notes (Signed)
PROBLEMS: Former smoker Mild COPD - very well compensated  INTERVAL HISTORY: Saw Dr.Ramachandran June and July of this year.  Was prescribed Bevespi inhaler but is not currently using due to no perceived benefit.  SUBJ: This is a scheduled follow-up.  She has no new complaints.  She reports only mild exertional dyspnea.  She has a Combivent inhaler to be used as needed.  She reports rarely using this medication.  She denies cough, sputum production, hemoptysis, chest pain, weight loss.  Her biggest problem recently has been a skin cancer on her RLE.  She is receiving some kind of injectable chemotherapy at the site of the cancer.  This is causing her pain in the right lower extremity.  Otherwise, she denies CP, fever, purulent sputum, hemoptysis, LE edema and calf tenderness.  OBJ: Vitals:   07/23/18 1007 07/23/18 1021  BP:  140/64  Pulse:  94  SpO2:  98%  Weight: 131 lb 12.8 oz (59.8 kg)   Height: 5\' 1"  (1.549 m)     Gen: No overt distress HEENT: NCAT, sclerae white Neck: No LAN, no JVD noted Lungs: Breath sounds full, no wheezes or other adventitious sounds Cardiovascular: Regular, no murmur noted Abdomen: Soft, NT, +BS Ext: Mild RLE pretibial and ankle pitting edema Neuro: No focal deficits Skin: No lesions noted  DATA: BMP Latest Ref Rng & Units 03/30/2018 01/31/2018 09/21/2017  Glucose 70 - 99 mg/dL 120(H) 92 95  BUN 8 - 23 mg/dL 24(H) 21 26(H)  Creatinine 0.44 - 1.00 mg/dL 1.27(H) 1.25(H) 1.31(H)  BUN/Creat Ratio 11 - 26 - - -  Sodium 135 - 145 mmol/L 143 139 141  Potassium 3.5 - 5.1 mmol/L 4.8 4.4 4.9  Chloride 98 - 111 mmol/L 107 104 105  CO2 22 - 32 mmol/L 28 27 29   Calcium 8.9 - 10.3 mg/dL 9.0 8.8 8.9   CBC Latest Ref Rng & Units 03/30/2018 01/31/2018 09/13/2017  WBC 3.6 - 11.0 K/uL 7.6 5.7 6.4  Hemoglobin 12.0 - 16.0 g/dL 14.4 13.2 13.2  Hematocrit 35.0 - 47.0 % 42.4 39.7 38.6  Platelets 150 - 440 K/uL 180 177.0 171    CXR 07/12: No acute  findings  IMPRESSION: Mild to moderate COPD very very well compensated.  PLAN: Continue Combivent inhaler as needed Follow-up in this office as needed for any breathing, chest or pulmonary problems  Merton Border, MD PCCM service Mobile [phone] Pager [phone] 07/23/2018 11:48 AM       Katrina Mcardle, MD [facility]/CCM

## 2018-07-23 NOTE — Patient Instructions (Signed)
Continue Combivent inhaler as needed Follow-up in this office as needed for any breathing or lung problems

## 2018-08-05 NOTE — Progress Notes (Signed)
Cardiology Office Note  Date:  08/06/2018   ID:  [name], DOB [birth year], MRN [id number]  PCP:  [name], MD   Chief Complaint  Patient presents with  . other    Dr. Derrel Nip would like patient evaluated for A-Fib issues. Pt.c/o chest pain several days ago and took 1 NTG tablet.     HPI:  [name] is a pleasant 84 year old woman with a history of  CAD, bypass surgery in 1999,Stress test October 2017 with no ischemia COPD, Smoking for 40 years who quit in 1985,  chronic renal insufficiency,  hypertension,  hyperlipidemia,  40-50% bilateral carotid arterial disease  EF >60% in 05/2015  C. difficile in January 2015. H/o fall wet floor  02/2017, Suffered a pelvic fracture, left shoulder pain who presents for routine followup of her coronary artery disease  Rare NTG, typically present at rest often when she is getting into bed Still plays golf, otherwise active with no chest pain or shortness of breath on exertion Receiving chemotherapy for cancer on right lower extremity Third chemotherapy did not make her feel as well, hurt the leg Scab is healing, very sore  Family coming for Thanksgiving No regular exercise program but in general very active, no falls, no gait instability  Reports extra beats noted by primary care She is not very symptomatic  Previous stress test April 2019 reviewed with her showing no significant ischemia  Lab work reviewed from last year total cholesterol 108  EKG personally reviewed by myself on todays visit Shows normal sinus rhythm rate 86 bpm APCs nonspecific ST abnormality  Of the past medical history reviewed Hospital admission 12/21/2016 for COPD exacerbation Syncope, dehydrated She received steroids,duo nebs, ABX  On previous office visits, reported having left-sided chest pain and shoulder pain Declined stress testing or catheterization  at that time.  On a previous clinic visit had chest pain, took aspirin and  nitroglycerin, after several hours symptoms resolved.   She has not had any intervention since her bypass surgery. No cardiac catheterizations, no stent placement.  C. difficile in January 2015. She was in the hospital for several days.  Last stress test was October 2011 and September 2015 , 2017 Carotid ultrasound showing 40-59% disease   PMH:   has a past medical history of C. difficile colitis, CAD (coronary artery disease), Carotid artery disease ([place]), Closed fracture pubis ([place]) (07/04/2017), COPD (chronic obstructive pulmonary disease) ([place]), Diffuse cystic mastopathy, Diverticulitis, Dizziness, Gait abnormality, GERD (gastroesophageal reflux disease), History of migraines, CABG, Hyperlipidemia, Hypertension, Indigestion, Kidney stones, Rheumatic fever, Sinus bradycardia, SOB (shortness of breath), and Syncopal episodes.  PSH:    Past Surgical History:  Procedure Laterality Date  . APPENDECTOMY  1950  . BREAST BIOPSY Right 1994  . BREAST CYST ASPIRATION     multiple BIL  . CATARACT EXTRACTION Right 2009  . CATARACT EXTRACTION Left 2011  . COLONOSCOPY  0258,5277   Dr. Jamal Collin  . CORONARY ARTERY BYPASS GRAFT  1999  . esophagus stretched    . TONSILLECTOMY  1939  . TOTAL ABDOMINAL HYSTERECTOMY  1968   due to metrorrhagia    Current Outpatient Medications  Medication Sig Dispense Refill  . amLODipine (NORVASC) 2.5 MG tablet TAKE ONE TABLET EVERY DAY 90 tablet 1  . clopidogrel (PLAVIX) 75 MG tablet TAKE ONE TABLET EVERY DAY 90 tablet 2  . escitalopram (LEXAPRO) 10 MG tablet Take 1 tablet (10 mg total) by mouth daily. 30 tablet 2  . esomeprazole (NEXIUM) 40 MG  capsule TAKE 1 CAPSULE DAILY BEFORE BREAKFAST 90 capsule 1  . fluticasone (FLONASE) 50 MCG/ACT nasal spray Place 2 sprays into both nostrils daily. 16 g 2  . Ipratropium-Albuterol (COMBIVENT RESPIMAT) 20-100 MCG/ACT AERS respimat Inhale 1 puff into the lungs every 6 (six) hours as needed for wheezing. 4 g 5  . LORazepam  (ATIVAN) 0.5 MG tablet Take 1.5 tablets (0.75 mg total) by mouth at bedtime. As needed for insomnia 45 tablet 5  . losartan (COZAAR) 100 MG tablet TAKE ONE TABLET EVERY DAY 90 tablet 2  . nitroGLYCERIN (NITROSTAT) 0.4 MG SL tablet DISSOLVE 1 TABLET UNDER TONGUE EVERY FIVE MINUTES UP TO 3 DOSES AS NEEDED 25 tablet 1  . simvastatin (ZOCOR) 40 MG tablet TAKE ONE TABLET AT BEDTIME 90 tablet 3  . metoprolol succinate (TOPROL-XL) 25 MG 24 hr tablet Take 1 tablet (25 mg total) by mouth daily. Take with or immediately following a meal. 90 tablet 3   No current facility-administered medications for this visit.      Allergies:   Codeine   Social History:  The patient  reports that she quit smoking about 34 years ago. Her smoking use included cigarettes. She has a 20.00 pack-year smoking history. She has never used smokeless tobacco. She reports that she drinks alcohol. She reports that she does not use drugs.   Family History:   family history includes Heart disease in her father and mother.    Review of Systems: Review of Systems  Constitutional: Negative.   Cardiovascular: Positive for chest pain.  Gastrointestinal: Negative.   Musculoskeletal: Positive for joint pain.       Right leg pain  Psychiatric/Behavioral: Negative.   All other systems reviewed and are negative.    PHYSICAL EXAM: VS:  BP (!) 150/70 (BP Location: Left Arm, Patient Position: Sitting, Cuff Size: Normal)   Pulse 86   Ht 5\' 3"  (1.6 m)   Wt 133 lb (60.3 kg)   BMI 23.56 kg/m  , BMI Body mass index is 23.56 kg/m. Constitutional:  oriented to person, place, and time. No distress.  HENT:  Head: Grossly normal Eyes:  no discharge. No scleral icterus.  Neck: No JVD, no carotid bruits  Cardiovascular: Regular rate and rhythm, no murmurs appreciated Pulmonary/Chest: Clear to auscultation bilaterally, no wheezes or rails Abdominal: Soft.  no distension.  no tenderness.  Musculoskeletal: Normal range of  motion Neurological:  normal muscle tone. Coordination normal. No atrophy Skin: Skin warm and dry Psychiatric: normal affect, pleasant  Recent Labs: 01/31/2018: ALT 14 03/30/2018: BUN 24; Creatinine, Ser 1.27; Hemoglobin 14.4; Platelets 180; Potassium 4.8; Sodium 143    Lipid Panel Lab Results  Component Value Date   CHOL 109 12/08/2016   HDL 31.30 (L) 12/08/2016   LDLCALC 53 12/08/2016   TRIG 122.0 12/08/2016      Wt Readings from Last 3 Encounters:  08/06/18 133 lb (60.3 kg)  07/23/18 131 lb 12.8 oz (59.8 kg)  07/10/18 131 lb 9.6 oz (59.7 kg)     ASSESSMENT AND PLAN:   Essential hypertension -  Blood pressure mildly elevated on today's visit Also with APCs, we will add metoprolol succinate 25 mg in the evening  Coronary artery disease involving native coronary artery of native heart without angina pectoris -  Stress test April 2019 but no significant ischemia She does report some chest pain but sounds somewhat atypical, happens at night only when getting into bed, more musculoskeletal Rare nitroglycerin, " cannot hurt right?" Recommend she call  us if she starts having any chest pain requiring nitroglycerin during the daytime with activities  History of bypass surgery Stress test as above Reports breathing is stable, rare atypical chest pain  Carotid stenosis Less than 39% bilaterally, significant plaque noted Cholesterol at goal  Hyperlipidemia Repeat lab work with primary care Previously at goal on Zocor 40    Total encounter time more than 25 minutes  Greater than 50% was spent in counseling and coordination of care with the patient   Disposition:   F/U  12 months   Orders Placed This Encounter  Procedures  . EKG 12-Lead     Signed, Esmond Plants, M.D., Ph.D. 08/06/2018  [place], [place]

## 2018-08-06 ENCOUNTER — Encounter: Payer: Self-pay | Admitting: Cardiovascular Disease

## 2018-08-06 ENCOUNTER — Ambulatory Visit (INDEPENDENT_AMBULATORY_CARE_PROVIDER_SITE_OTHER): Payer: Medicare Other | Admitting: Cardiovascular Disease

## 2018-08-06 VITALS — BP 150/70 | HR 86 | Ht 63.0 in | Wt 133.0 lb

## 2018-08-06 DIAGNOSIS — N183 Chronic kidney disease, stage 3 unspecified: Secondary | ICD-10-CM

## 2018-08-06 DIAGNOSIS — I700 Atherosclerosis of aorta: Secondary | ICD-10-CM

## 2018-08-06 DIAGNOSIS — I10 Essential (primary) hypertension: Secondary | ICD-10-CM | POA: Diagnosis not present

## 2018-08-06 DIAGNOSIS — I25118 Atherosclerotic heart disease of native coronary artery with other forms of angina pectoris: Secondary | ICD-10-CM | POA: Diagnosis not present

## 2018-08-06 DIAGNOSIS — Z951 Presence of aortocoronary bypass graft: Secondary | ICD-10-CM | POA: Diagnosis not present

## 2018-08-06 DIAGNOSIS — I6523 Occlusion and stenosis of bilateral carotid arteries: Secondary | ICD-10-CM | POA: Diagnosis not present

## 2018-08-06 DIAGNOSIS — E782 Mixed hyperlipidemia: Secondary | ICD-10-CM | POA: Diagnosis not present

## 2018-08-06 MED ORDER — METOPROLOL SUCCINATE ER 25 MG PO TB24: 25.0000 mg | ORAL_TABLET | Freq: Every day | ORAL | 3 refills | Status: DC

## 2018-08-06 NOTE — Patient Instructions (Addendum)
Medication Instructions:   Please start metoprolol succinate 25 mg daily  Stop the aspirin,  Stay on plavix  If you need a refill on your cardiac medications before your next appointment, please call your pharmacy.    Lab work: No new labs needed   If you have labs (blood work) drawn today and your tests are completely normal, you will receive your results only by: Marland Kitchen MyChart Message (if you have MyChart) OR . A paper copy in the mail If you have any lab test that is abnormal or we need to change your treatment, we will call you to review the results.   Testing/Procedures: No new testing needed   Follow-Up: At [facility], you and your health needs are our priority.  As part of our continuing mission to provide you with exceptional heart care, we have created designated Provider Care Teams.  These Care Teams include your primary Cardiologist (physician) and Advanced Practice Providers (APPs -  Physician Assistants and Nurse Practitioners) who all work together to provide you with the care you need, when you need it.  . You will need a follow up appointment in 12 months .   Please call our office 2 months in advance to schedule this appointment.    . Providers on your designated Care Team:   . Murray Hodgkins, NP . Christell Faith, PA-C . Marrianne Mood, PA-C  Any Other Special Instructions Will Be Listed Below (If Applicable).  For educational health videos Log in to : [email or web address] Or : [email or web address], password : [email or web address]

## 2018-08-15 DIAGNOSIS — C44722 Squamous cell carcinoma of skin of right lower limb, including hip: Secondary | ICD-10-CM | POA: Diagnosis not present

## 2018-10-02 DIAGNOSIS — L578 Other skin changes due to chronic exposure to nonionizing radiation: Secondary | ICD-10-CM | POA: Diagnosis not present

## 2018-10-02 DIAGNOSIS — C44722 Squamous cell carcinoma of skin of right lower limb, including hip: Secondary | ICD-10-CM | POA: Diagnosis not present

## 2018-10-02 DIAGNOSIS — L820 Inflamed seborrheic keratosis: Secondary | ICD-10-CM | POA: Diagnosis not present

## 2018-10-02 DIAGNOSIS — L570 Actinic keratosis: Secondary | ICD-10-CM | POA: Diagnosis not present

## 2018-10-18 ENCOUNTER — Other Ambulatory Visit: Payer: Self-pay | Admitting: Cardiovascular Disease

## 2018-11-06 ENCOUNTER — Other Ambulatory Visit: Payer: Self-pay | Admitting: Cardiovascular Disease

## 2018-11-16 DIAGNOSIS — Z961 Presence of intraocular lens: Secondary | ICD-10-CM | POA: Diagnosis not present

## 2018-12-17 ENCOUNTER — Other Ambulatory Visit: Payer: Self-pay | Admitting: Internal Medicine

## 2018-12-17 DIAGNOSIS — Z1231 Encounter for screening mammogram for malignant neoplasm of breast: Secondary | ICD-10-CM

## 2018-12-21 ENCOUNTER — Other Ambulatory Visit: Payer: Self-pay | Admitting: Internal Medicine

## 2018-12-21 DIAGNOSIS — C44722 Squamous cell carcinoma of skin of right lower limb, including hip: Secondary | ICD-10-CM | POA: Diagnosis not present

## 2018-12-21 DIAGNOSIS — D485 Neoplasm of uncertain behavior of skin: Secondary | ICD-10-CM | POA: Diagnosis not present

## 2018-12-21 NOTE — Telephone Encounter (Signed)
Refilled: 07/10/2018 Last OV: 07/10/2018 Next OV: 03/25/2019

## 2019-01-10 DIAGNOSIS — T8140XA Infection following a procedure, unspecified, initial encounter: Secondary | ICD-10-CM | POA: Diagnosis not present

## 2019-02-04 DIAGNOSIS — H539 Unspecified visual disturbance: Secondary | ICD-10-CM | POA: Diagnosis not present

## 2019-02-05 ENCOUNTER — Ambulatory Visit: Payer: Self-pay | Admitting: Internal Medicine

## 2019-02-05 ENCOUNTER — Other Ambulatory Visit: Payer: Self-pay

## 2019-02-05 ENCOUNTER — Ambulatory Visit (INDEPENDENT_AMBULATORY_CARE_PROVIDER_SITE_OTHER): Payer: Medicare Other | Admitting: Family Medicine

## 2019-02-05 ENCOUNTER — Encounter: Payer: Self-pay | Admitting: Family Medicine

## 2019-02-05 VITALS — BP 148/80 | HR 67 | Temp 98.2°F | Resp 18 | Ht 62.0 in | Wt 133.2 lb

## 2019-02-05 DIAGNOSIS — R42 Dizziness and giddiness: Secondary | ICD-10-CM | POA: Diagnosis not present

## 2019-02-05 DIAGNOSIS — I10 Essential (primary) hypertension: Secondary | ICD-10-CM

## 2019-02-05 NOTE — Telephone Encounter (Signed)
Pt has 2:00pm Phone visit today

## 2019-02-05 NOTE — Progress Notes (Signed)
Subjective:    Patient ID: [name], female    DOB: May 06, 1926, 85 y.o.   MRN: [id number]  HPI  Originally planned to do visit over phone. Patient's at home BP cuff not working. That is her main concern, to get BP checked. So patient advised to come into office.  Patient states when she first sits up in the morning she will see these "black balls" in her eyes and then she will lay back down for a few minutes and they will go away.  She will then get up again and get ready for her day and everything seems to function normally.  She went to the eye doctor yesterday to have her eyes checked due to concerns over the black ball she was saying.  Patient states she was given a clean bill of health from the eye doctor about her eyes and eye doctor mentioned that the black ball she is seeing could be related to blood pressure dropping in the morning when she first sits up.  Patient became concerned about her blood pressure and this is why she want to come in and have her blood pressure checked today.  States she did have a BP cuff at home, but it stopped working.  Also states she is never sure what her blood pressure cuff at home should be reading and does not trust at home cuff so would prefer to have [place] check her blood pressure.  Patient does have a history of heart disease, CABG. She is on to control BP, HR. patient states her ankles do become swollen at times, but tends to improve after elevating legs.    Patient Active Problem List   Diagnosis Date Noted  . Melena 02/03/2018  . Hearing loss secondary to cerumen impaction 12/26/2017  . S/P excision of skin lesion, follow-up exam 12/26/2017  . PNA (pneumonia) 09/07/2017  . Impingement syndrome of left shoulder region 07/04/2017  . Neck pain 07/04/2017  . Pelvic fracture ([place]) 03/08/2017  . Allergic rhinitis 12/31/2016  . Hospital discharge follow-up 12/31/2016  . Change in bowel movement 08/20/2016  . SCC (squamous cell carcinoma),  face 08/01/2016  . EKG abnormalities 06/17/2016  . Osteoporosis of forearm 02/13/2016  . Dizziness and giddiness 02/13/2016  . Other specified symptoms and signs involving the circulatory and respiratory systems 01/31/2016  . Insomnia secondary to anxiety 07/12/2015  . Anxiety disorder 07/12/2015  . Essential (primary) hypertension 10/07/2014  . Syncope and collapse   . Sinus bradycardia   . Hardening of the aorta (main artery of the heart) ([place]) 06/26/2014  . History of Clostridium difficile colitis 10/20/2013  . Personal history of other diseases of the digestive system 10/20/2013  . Low back pain 10/04/2013  . Encounter for general adult medical examination without abnormal findings 02/07/2013  . Chest pain at rest 12/26/2011  . Chronic obstructive pulmonary disease ([place]) 12/05/2011  . Presence of aortocoronary bypass graft   . Chronic kidney disease, stage III (moderate) ([place]) 09/25/2011  . Hyperlipidemia   . Hypertension   . SOB (shortness of breath)   . Coronary artery disease of native artery of native heart with stable angina pectoris ([place])   . Carotid artery disease ([facility])    Social History   Tobacco Use  . Smoking status: Former Smoker    Packs/day: 0.50    Years: 40.00    Pack years: 20.00    Types: Cigarettes    Last attempt to quit: 09/20/1983    Years since  quitting: 35.4  . Smokeless tobacco: Never Used  Substance Use Topics  . Alcohol use: Yes    Comment: yes, social wine   Review of Systems  Constitutional: Negative for chills, fatigue and fever.  HENT: Negative for congestion, ear pain, sinus pain and sore throat.   Eyes: "I see black balls when I first sit up in the morning" Respiratory: Negative for cough, shortness of breath and wheezing.   Cardiovascular: Negative for chest pain, palpitations. Some leg swelling.  Gastrointestinal: Negative for abdominal pain, diarrhea, nausea and vomiting.  Genitourinary: Negative for dysuria, frequency and urgency.   Musculoskeletal: Negative for arthralgias and myalgias.  Skin: Negative for color change, pallor and rash.  Neurological: Negative for syncope, light-headedness and headaches.  Psychiatric/Behavioral: The patient is not nervous/anxious.       Objective:   Physical Exam Vitals signs and nursing note reviewed.  Constitutional:      General: She is not in acute distress.    Appearance: She is not ill-appearing, toxic-appearing or diaphoretic.  HENT:     Head: Normocephalic and atraumatic.  Eyes:     General: No scleral icterus.    Extraocular Movements: Extraocular movements intact.     Conjunctiva/sclera: Conjunctivae normal.     Pupils: Pupils are equal, round, and reactive to light.  Neck:     Musculoskeletal: Normal range of motion and neck supple. No neck rigidity or muscular tenderness.  Cardiovascular:     Rate and Rhythm: Normal rate and regular rhythm.  Pulmonary:     Effort: Pulmonary effort is normal. No respiratory distress.     Breath sounds: Normal breath sounds.  Musculoskeletal:     Comments: Trace edema bilat ankles  Skin:    General: Skin is warm and dry.     Coloration: Skin is not jaundiced or pale.     Findings: No erythema.  Neurological:     General: No focal deficit present.     Mental Status: She is alert and oriented to person, place, and time.     Gait: Gait normal.  Psychiatric:        Mood and Affect: Mood normal.        Behavior: Behavior normal.    Vitals:   02/05/19 1501  BP: (!) 148/80  Pulse: 67  Resp: 18  Temp: 98.2 F (36.8 C)  SpO2: 94%      Assessment & Plan:    Essential hypertension/episodic dizziness - patient's blood pressure looks great in clinic today and is comparable to blood pressure readings she has had at multiple other office visits here.  Heart rate and rhythm are stable.  Her gait is normal.  She is neurologically intact.  After patient's description of what happens when she first sits up in the morning and also  had getting a clean bill of health in regard to her eye health from the eye doctor, I suspect what is happening is some orthostatic dizziness in the morning when she first sits up.  Advised patient to be sure she is drinking enough fluid throughout the day, advised to give self a little bit of time when sitting up in bed in the morning prior to getting up and walking to allow body to get oriented from changing position.  Patient was given for scheduled follow-up with PCP as planned.  Advised to return to clinic sooner if any issues arise.  Advised to call at anytime with questions or concerns.

## 2019-02-05 NOTE — Progress Notes (Deleted)
Patient ID: [name], female   DOB: [birth year], 85 y.o.   MRN: [id number]    Virtual Visit via *** Note  This visit type was conducted due to national recommendations for restrictions regarding the COVID-19 pandemic (e.g. social distancing).  This format is felt to be most appropriate for this patient at this time.  All issues noted in this document were discussed and addressed.  No physical exam was performed (except for noted visual exam findings with Video Visits).   I connected with@ today at  2:00 PM EDT by a video enabled telemedicine application or telephone and verified that I am speaking with the correct person using two identifiers. Location patient: home Location provider: work or home office Persons participating in the virtual visit: patient, provider  I discussed the limitations, risks, security and privacy concerns of performing an evaluation and management service by telephone and the availability of in person appointments. I also discussed with the patient that there may be a patient responsible charge related to this service. The patient expressed understanding and agreed to proceed.  Interactive audio and video telecommunications were attempted between this provider and patient, however failed, due to patient having technical difficulties OR patient did not have access to video capability.  We continued and completed visit with audio only. ***  Reason for visit: ***  HPI: ***   ROS: See pertinent positives and negatives per HPI.  Past Medical History:  Diagnosis Date  . C. difficile colitis   . CAD (coronary artery disease)    a. Nuclear 10/11, not gated, no scar or ischemia, EF 68%, low risk study; b. [place] 06/18/14: no evidence of infarction or ischemia, no WMA, normalization of TWI in precordial leads, equivocal EKG changes EF 77%, low risk study  . Carotid artery disease ([place])    Doppler January, 2012, 40-59% bilateral, followup one year  .  Closed fracture pubis ([place]) 07/04/2017  . COPD (chronic obstructive pulmonary disease) ([place])   . Diffuse cystic mastopathy   . Diverticulitis    last colonoscopy  incomplete Jan 2011  . Dizziness    stable now (Oct 2011) patient tells me question of TIA, we will obtain records  . Gait abnormality    Patient complains of "wobbly gait", December, 2013  . GERD (gastroesophageal reflux disease)   . History of migraines   . Hx of CABG    a. 3 vessel in 1999  . Hyperlipidemia   . Hypertension   . Indigestion    3 weeks, October 2011  . Kidney stones   . Rheumatic fever   . Sinus bradycardia    Mild, December, 2013  . SOB (shortness of breath)   . Syncopal episodes    after 18 holes of golf and increase hydrochlorothiazide, resolved     Past Surgical History:  Procedure Laterality Date  . APPENDECTOMY  1950  . BREAST BIOPSY Right 1994  . BREAST CYST ASPIRATION     multiple BIL  . CATARACT EXTRACTION Right 2009  . CATARACT EXTRACTION Left 2011  . COLONOSCOPY  [id number]   Dr. Jamal Collin  . CORONARY ARTERY BYPASS GRAFT  1999  . esophagus stretched    . TONSILLECTOMY  1939  . TOTAL ABDOMINAL HYSTERECTOMY  1968   due to metrorrhagia    Family History  Problem Relation Age of Onset  . Heart disease Mother   . Heart disease Father   . Cancer Neg Hx   . Drug abuse Neg Hx   .  Breast cancer Neg Hx    Social History   Tobacco Use  . Smoking status: Former Smoker    Packs/day: 0.50    Years: 40.00    Pack years: 20.00    Types: Cigarettes    Last attempt to quit: 09/20/1983    Years since quitting: 35.4  . Smokeless tobacco: Never Used  Substance Use Topics  . Alcohol use: Yes    Comment: yes, social wine      Current Outpatient Medications:  .  amLODipine (NORVASC) 2.5 MG tablet, TAKE ONE TABLET EVERY DAY, Disp: 90 tablet, Rfl: 1 .  clopidogrel (PLAVIX) 75 MG tablet, TAKE ONE TABLET EVERY DAY, Disp: 90 tablet, Rfl: 2 .  escitalopram (LEXAPRO) 10 MG tablet, Take 1  tablet (10 mg total) by mouth daily., Disp: 30 tablet, Rfl: 2 .  esomeprazole (NEXIUM) 40 MG capsule, TAKE 1 CAPSULE DAILY BEFORE BREAKFAST, Disp: 90 capsule, Rfl: 1 .  fluticasone (FLONASE) 50 MCG/ACT nasal spray, Place 2 sprays into both nostrils daily., Disp: 16 g, Rfl: 2 .  Ipratropium-Albuterol (COMBIVENT RESPIMAT) 20-100 MCG/ACT AERS respimat, Inhale 1 puff into the lungs every 6 (six) hours as needed for wheezing., Disp: 4 g, Rfl: 5 .  LORazepam (ATIVAN) 0.5 MG tablet, TAKE 1 1/2 TABLETS AT BEDTIME AS NEEDED FOR INSOMNIA, Disp: 45 tablet, Rfl: 0 .  losartan (COZAAR) 100 MG tablet, TAKE ONE TABLET EVERY DAY, Disp: 90 tablet, Rfl: 2 .  metoprolol succinate (TOPROL-XL) 25 MG 24 hr tablet, Take 1 tablet (25 mg total) by mouth daily. Take with or immediately following a meal., Disp: 90 tablet, Rfl: 3 .  nitroGLYCERIN (NITROSTAT) 0.4 MG SL tablet, PLACE 1 TABLET UNDER TONGUE EVERY 5 MIN AS NEEDED FOR CHEST PAIN IF NO RELIEF IN15 MIN CALL 911 (MAX 3 TABS), Disp: 25 tablet, Rfl: 1 .  simvastatin (ZOCOR) 40 MG tablet, TAKE ONE TABLET AT BEDTIME, Disp: 90 tablet, Rfl: 1  EXAM:  BP Readings from Last 3 Encounters:  08/06/18 (!) 150/70  07/23/18 140/64  07/10/18 140/78     GENERAL: alert, oriented, appears well and in no acute distress  HEENT: atraumatic, conjunttiva clear, no obvious abnormalities on inspection of external nose and ears  NECK: normal movements of the head and neck  LUNGS: on inspection no signs of respiratory distress, breathing rate appears normal, no obvious gross SOB, gasping or wheezing  CV: no obvious cyanosis  MS: moves all visible extremities without noticeable abnormality  PSYCH/NEURO: pleasant and cooperative, no obvious depression or anxiety, speech and thought processing grossly intact  ASSESSMENT AND PLAN:  Discussed the following assessment and plan:  No diagnosis found.  No problem-specific Assessment & Plan notes found for this encounter.    I  discussed the assessment and treatment plan with the patient. The patient was provided an opportunity to ask questions and all were answered. The patient agreed with the plan and demonstrated an understanding of the instructions.   The patient was advised to call back or seek an in-person evaluation if the symptoms worsen or if the condition fails to improve as anticipated.  I provided *** minutes of non-face-to-face time during this encounter.   Jodelle Green, FNP

## 2019-02-05 NOTE — Telephone Encounter (Signed)
Scheduled a pt a phone appt w/ Philis Nettle, NP today @ 2:00 pm.

## 2019-02-05 NOTE — Telephone Encounter (Signed)
Pt called in c/o both ankles being swollen on and off for the past month.  She had a CABG so she is concerned since she is a heart pt. She also mentioned "having black balls that float in front of her eyes when she sits up first thing in the morning.   She saw her eye doctor yesterday and her eyes are fine.  He said he thinks it is from her BP being low first thing in the morning and it's causing the black balls she is seeing.  I spoke with Butch Penny in Dr. Lupita Dawn office to get her scheduled for a virtual visit due to the COVID-19 pandemic.   She put me on hold.   I let the pt know I had not forgotten her.   She said her phone battery was getting low and could I call her back.    The number is [phone].   Butch Penny got back on the line.  I let her know she would need to call the pt back on her land line number.  Butch Penny is checking with the NP since the pt can't do a video chat call.   Dr. Derrel Nip doesn't have any openings.   The office is going to call the pt back and set up a phone appt with the NP for this afternoon.  I sent my triage notes to the office.    Reason for Disposition . [1] MODERATE leg swelling (e.g., swelling extends up to knees) AND [2] new onset or worsening    Both ankles swollen on and off for a month  Answer Assessment - Initial Assessment Questions 1. LOCATION: "Which joint is swollen?"     For last 4-5 days when I sit up in the morning I see these big black balls in my eyes.   I saw my eye doctor yesterday.   He said it was my BP causing that. Both ankles are swollen.   But it comes and goes.  No fluid pill.   I have heart problems.  I had a CABG and has COPD 2. ONSET: "When did the swelling start?"     The swelling has been going on for a month or so. 3. SIZE: "How large is the swelling?"     Right now they look good.   I can see my ankle bones today.    I've had my feet propped up today. 4. PAIN: "Is there any pain?" If so, ask: "How bad is it?" (Scale 1-10; or mild,  moderate, severe)     No 5. CAUSE: "What do you think caused the swollen joint?"     I'm a heart pt. 6. OTHER SYMPTOMS: "Do you have any other symptoms?" (e.g., fever, chest pain, difficulty breathing, calf pain)     I have COPD so I'm always short of breath.   No chest pain.    I have a catch in my neck.   7. PREGNANCY: "Is there any chance you are pregnant?" "When was your last menstrual period?"     N/A  Protocols used: LEG SWELLING AND EDEMA-A-AH, ANKLE SWELLING-A-AH

## 2019-02-06 DIAGNOSIS — C44722 Squamous cell carcinoma of skin of right lower limb, including hip: Secondary | ICD-10-CM | POA: Diagnosis not present

## 2019-02-20 DIAGNOSIS — Z85828 Personal history of other malignant neoplasm of skin: Secondary | ICD-10-CM | POA: Diagnosis not present

## 2019-02-20 DIAGNOSIS — L821 Other seborrheic keratosis: Secondary | ICD-10-CM | POA: Diagnosis not present

## 2019-02-20 DIAGNOSIS — X32XXXA Exposure to sunlight, initial encounter: Secondary | ICD-10-CM | POA: Diagnosis not present

## 2019-02-20 DIAGNOSIS — Z08 Encounter for follow-up examination after completed treatment for malignant neoplasm: Secondary | ICD-10-CM | POA: Diagnosis not present

## 2019-02-20 DIAGNOSIS — L570 Actinic keratosis: Secondary | ICD-10-CM | POA: Diagnosis not present

## 2019-02-22 ENCOUNTER — Ambulatory Visit
Admission: RE | Admit: 2019-02-22 | Discharge: 2019-02-22 | Disposition: A | Discharge status: Home / Self Care | Payer: Medicare Other | Source: Ambulatory Visit | Attending: Internal Medicine | Admitting: Internal Medicine

## 2019-02-22 ENCOUNTER — Other Ambulatory Visit: Payer: Self-pay

## 2019-02-22 DIAGNOSIS — Z1231 Encounter for screening mammogram for malignant neoplasm of breast: Secondary | ICD-10-CM | POA: Diagnosis not present

## 2019-02-25 ENCOUNTER — Emergency Department: Payer: Medicare Other

## 2019-02-25 ENCOUNTER — Other Ambulatory Visit: Payer: Self-pay

## 2019-02-25 ENCOUNTER — Emergency Department
Admission: EM | Admit: 2019-02-25 | Discharge: 2019-02-25 | Disposition: A | Discharge status: Home / Self Care | Payer: Medicare Other | Attending: Emergency Medicine | Admitting: Emergency Medicine

## 2019-02-25 ENCOUNTER — Telehealth: Payer: Self-pay | Admitting: Cardiovascular Disease

## 2019-02-25 DIAGNOSIS — N183 Chronic kidney disease, stage 3 (moderate): Secondary | ICD-10-CM | POA: Diagnosis not present

## 2019-02-25 DIAGNOSIS — R079 Chest pain, unspecified: Secondary | ICD-10-CM | POA: Diagnosis not present

## 2019-02-25 DIAGNOSIS — R0789 Other chest pain: Secondary | ICD-10-CM | POA: Diagnosis not present

## 2019-02-25 DIAGNOSIS — I129 Hypertensive chronic kidney disease with stage 1 through stage 4 chronic kidney disease, or unspecified chronic kidney disease: Secondary | ICD-10-CM | POA: Insufficient documentation

## 2019-02-25 DIAGNOSIS — I25118 Atherosclerotic heart disease of native coronary artery with other forms of angina pectoris: Secondary | ICD-10-CM | POA: Diagnosis not present

## 2019-02-25 DIAGNOSIS — Z951 Presence of aortocoronary bypass graft: Secondary | ICD-10-CM | POA: Diagnosis not present

## 2019-02-25 DIAGNOSIS — Z87891 Personal history of nicotine dependence: Secondary | ICD-10-CM | POA: Diagnosis not present

## 2019-02-25 DIAGNOSIS — J449 Chronic obstructive pulmonary disease, unspecified: Secondary | ICD-10-CM | POA: Diagnosis not present

## 2019-02-25 LAB — BASIC METABOLIC PANEL
Anion gap: 9 (ref 5–15)
BUN: 20 mg/dL (ref 8–23)
CO2: 25 mmol/L (ref 22–32)
Calcium: 8.7 mg/dL — ABNORMAL LOW (ref 8.9–10.3)
Chloride: 108 mmol/L (ref 98–111)
Creatinine, Ser: 1.07 mg/dL — ABNORMAL HIGH (ref 0.44–1.00)
GFR calc Af Amer: 53 mL/min — ABNORMAL LOW (ref >60)
GFR calc non Af Amer: 46 mL/min — ABNORMAL LOW (ref >60)
Glucose, Bld: 120 mg/dL — ABNORMAL HIGH (ref 70–99)
Potassium: 4.1 mmol/L (ref 3.5–5.1)
Sodium: 142 mmol/L (ref 135–145)

## 2019-02-25 LAB — CBC
HCT: 40.7 % (ref 36.0–46.0)
Hemoglobin: 13.6 g/dL (ref 12.0–15.0)
MCH: 32.7 pg (ref 26.0–34.0)
MCHC: 33.4 g/dL (ref 30.0–36.0)
MCV: 97.8 fL (ref 80.0–100.0)
Platelets: 155 10*3/uL (ref 150–400)
RBC: 4.16 MIL/uL (ref 3.87–5.11)
RDW: 12.1 % (ref 11.5–15.5)
WBC: 6.8 10*3/uL (ref 4.0–10.5)
nRBC: 0 % (ref 0.0–0.2)

## 2019-02-25 LAB — TROPONIN I: Troponin I: <0.03 ng/mL (ref <0.03)

## 2019-02-25 MED ORDER — ISOSORBIDE MONONITRATE ER 30 MG PO TB24: 30.0000 mg | ORAL_TABLET | Freq: Every day | ORAL | 11 refills | Status: DC

## 2019-02-25 NOTE — ED Provider Notes (Signed)
[facility] Emergency Department Provider Note       Time seen: ----------------------------------------- 12:49 PM on 02/25/2019 -----------------------------------------   I have reviewed the triage vital signs and the nursing notes.  HISTORY   Chief Complaint Chest Pain    HPI [name] is a 85 y.o. female with a history of C. difficile colitis, coronary artery disease, COPD, GERD, hypertension, hyperlipidemia, who presents to the ED for central chest pain that started intermittently since Friday.  Patient states she had pain around 6 AM today that was relieved by 2 nitroglycerin.  She called her cardiologist and was referred to come to the ER for evaluation.  She is currently chest pain-free.  She denies sweats, nausea or shortness of breath  Past Medical History:  Diagnosis Date  . C. difficile colitis   . CAD (coronary artery disease)    a. Nuclear 10/11, not gated, no scar or ischemia, EF 68%, low risk study; b. [place] 06/18/14: no evidence of infarction or ischemia, no WMA, normalization of TWI in precordial leads, equivocal EKG changes EF 77%, low risk study  . Carotid artery disease ([place])    Doppler January, 2012, 40-59% bilateral, followup one year  . Closed fracture pubis ([place]) 07/04/2017  . COPD (chronic obstructive pulmonary disease) ([place])   . Diffuse cystic mastopathy   . Diverticulitis    last colonoscopy  incomplete Jan 2011  . Dizziness    stable now (Oct 2011) patient tells me question of TIA, we will obtain records  . Gait abnormality    Patient complains of "wobbly gait", December, 2013  . GERD (gastroesophageal reflux disease)   . History of migraines   . Hx of CABG    a. 3 vessel in 1999  . Hyperlipidemia   . Hypertension   . Indigestion    3 weeks, October 2011  . Kidney stones   . Rheumatic fever   . Sinus bradycardia    Mild, December, 2013  . SOB (shortness of breath)   . Syncopal episodes    after  18 holes of golf and increase hydrochlorothiazide, resolved     Patient Active Problem List   Diagnosis Date Noted  . Melena 02/03/2018  . Hearing loss secondary to cerumen impaction 12/26/2017  . S/P excision of skin lesion, follow-up exam 12/26/2017  . PNA (pneumonia) 09/07/2017  . Impingement syndrome of left shoulder region 07/04/2017  . Neck pain 07/04/2017  . Pelvic fracture ([place]) 03/08/2017  . Allergic rhinitis 12/31/2016  . Hospital discharge follow-up 12/31/2016  . Change in bowel movement 08/20/2016  . SCC (squamous cell carcinoma), face 08/01/2016  . EKG abnormalities 06/17/2016  . Osteoporosis of forearm 02/13/2016  . Dizziness and giddiness 02/13/2016  . Other specified symptoms and signs involving the circulatory and respiratory systems 01/31/2016  . Insomnia secondary to anxiety 07/12/2015  . Anxiety disorder 07/12/2015  . Essential (primary) hypertension 10/07/2014  . Syncope and collapse   . Sinus bradycardia   . Hardening of the aorta (main artery of the heart) ([place]) 06/26/2014  . History of Clostridium difficile colitis 10/20/2013  . Personal history of other diseases of the digestive system 10/20/2013  . Low back pain 10/04/2013  . Encounter for general adult medical examination without abnormal findings 02/07/2013  . Chest pain at rest 12/26/2011  . Chronic obstructive pulmonary disease ([place]) 12/05/2011  . Presence of aortocoronary bypass graft   . Chronic kidney disease, stage III (moderate) ([place]) 09/25/2011  . Hyperlipidemia   .  Hypertension   . SOB (shortness of breath)   . Coronary artery disease of native artery of native heart with stable angina pectoris ([place])   . Carotid artery disease [facility])     Past Surgical History:  Procedure Laterality Date  . APPENDECTOMY  1950  . BREAST BIOPSY Right 1994  . BREAST CYST ASPIRATION     multiple BIL  . CATARACT EXTRACTION Right 2009  . CATARACT EXTRACTION Left 2011  . COLONOSCOPY  [id number]   Dr. Jamal Collin   . CORONARY ARTERY BYPASS GRAFT  1999  . esophagus stretched    . TONSILLECTOMY  1939  . TOTAL ABDOMINAL HYSTERECTOMY  1968   due to metrorrhagia    Allergies Codeine  Social History Social History   Tobacco Use  . Smoking status: Former Smoker    Packs/day: 0.50    Years: 40.00    Pack years: 20.00    Types: Cigarettes    Last attempt to quit: 09/20/1983    Years since quitting: 35.4  . Smokeless tobacco: Never Used  Substance Use Topics  . Alcohol use: Yes    Comment: yes, social wine  . Drug use: No   Review of Systems Constitutional: Negative for fever. Cardiovascular: Positive for chest pain Respiratory: Negative for shortness of breath. Gastrointestinal: Negative for abdominal pain, vomiting and diarrhea. Musculoskeletal: Negative for back pain. Skin: Negative for rash. Neurological: Negative for headaches, focal weakness or numbness.  All systems negative/normal/unremarkable except as stated in the HPI  ____________________________________________   PHYSICAL EXAM:  VITAL SIGNS: ED Triage Vitals  Enc Vitals Group     BP 02/25/19 1242 (!) 178/67     Pulse Rate 02/25/19 1242 75     Resp 02/25/19 1242 18     Temp 02/25/19 1242 98 F (36.7 C)     Temp Source 02/25/19 1242 Oral     SpO2 02/25/19 1242 95 %     Weight 02/25/19 1246 130 lb (59 kg)     Height 02/25/19 1246 5\' 3"  (1.6 m)     Head Circumference --      Peak Flow --      Pain Score 02/25/19 1243 0     Pain Loc --      Pain Edu? --      Excl. in [place]? --    Constitutional: Alert and oriented. Well appearing and in no distress. Eyes: Conjunctivae are normal. Normal extraocular movements. ENT      Head: Normocephalic and atraumatic.      Nose: No congestion/rhinnorhea.      Mouth/Throat: Mucous membranes are moist.      Neck: No stridor. Cardiovascular: Normal rate, regular rhythm. No murmurs, rubs, or gallops. Respiratory: Normal respiratory effort without tachypnea nor retractions. Breath  sounds are clear and equal bilaterally. No wheezes/rales/rhonchi. Gastrointestinal: Soft and nontender. Normal bowel sounds Musculoskeletal: Nontender with normal range of motion in extremities. No lower extremity tenderness nor edema. Neurologic:  Normal speech and language. No gross focal neurologic deficits are appreciated.  Skin:  Skin is warm, dry and intact. No rash noted. Psychiatric: Mood and affect are normal. Speech and behavior are normal.  ____________________________________________  EKG: Interpreted by me.  Sinus rhythm with rate of 71 bpm, low voltage QRS, nonspecific ST segment changes, normal QT  ____________________________________________  ED COURSE:  As part of my medical decision making, I reviewed the following data within the [place] History obtained from family if available, nursing notes, old chart and ekg,  as well as notes from prior ED visits. Patient presented for chest pain, we will assess with labs and imaging as indicated at this time.   Procedures  [name] was evaluated in Emergency Department on 02/25/2019 for the symptoms described in the history of present illness. She was evaluated in the context of the global COVID-19 pandemic, which necessitated consideration that the patient might be at risk for infection with the SARS-CoV-2 virus that causes COVID-19. Institutional protocols and algorithms that pertain to the evaluation of patients at risk for COVID-19 are in a state of rapid change based on information released by regulatory bodies including the CDC and federal and state organizations. These policies and algorithms were followed during the patient's care in the ED.  ____________________________________________   LABS (pertinent positives/negatives)  Labs Reviewed  BASIC METABOLIC PANEL - Abnormal; Notable for the following components:      Result Value   Glucose, Bld 120 (*)    Creatinine, Ser 1.07 (*)    Calcium 8.7  (*)    GFR calc non Af Amer 46 (*)    GFR calc Af Amer 53 (*)    All other components within normal limits  CBC  TROPONIN I    RADIOLOGY  Chest x-ray RESSION: No active disease. ____________________________________________   DIFFERENTIAL DIAGNOSIS   Unstable angina, MI, musculoskeletal pain, GERD, anxiety  FINAL ASSESSMENT AND PLAN  Chest pain   Plan: The patient had presented for chest pain. Patient's labs were unremarkable. Patient's imaging was reassuring.  Patient has a history of requiring intermittent nitroglycerin.  I have been in discussion with her cardiologist.  We will place her on a low-dose nitroglycerin and advise close outpatient follow-up which she prefers.  She does not want to stay in the hospital right now.   Laurence Aly, MD    Note: This note was generated in part or whole with voice recognition software. Voice recognition is usually quite accurate but there are transcription errors that can and very often do occur. I apologize for any typographical errors that were not detected and corrected.     Earleen Newport, MD 02/25/19 [phone]

## 2019-02-25 NOTE — Telephone Encounter (Signed)
Spoke with the pt and made her aware of Laurine Blazer recommendation. Adv the pt that Thurmond Butts recommends that the she be seen in the ED for evaluation of her symptoms. Pt sts that she was reluctant to go to the ED due to [place], but she will comply with Ryan's recommendation and have her daughter take her to the ED asap. Pt voiced appreciation for the assistance.

## 2019-02-25 NOTE — Telephone Encounter (Signed)
Pt c/o of Chest Pain: STAT if CP now or developed within 24 hours  1. Are you having CP right now? Not at this moment   2. Are you experiencing any other symptoms (ex. SOB, nausea, vomiting, sweating)? SOB, swelling in legs   3. How long have you been experiencing CP?   4. Is your CP continuous or coming and going? Coming and going   5. Have you taken Nitroglycerin? Yes

## 2019-02-25 NOTE — ED Triage Notes (Signed)
Pt c/o central chest pain intermittently since Friday., states she had pain around 6am and was relieved by 2 nitro, states she called her cardiologist and was referred to the ED, states she has a hx of angina.

## 2019-02-25 NOTE — Telephone Encounter (Signed)
Spoke with the pt. Pt sts that she is not currently having chest pain, but she has had 3 episodes in the last 4 days requiring Nitro use Fri, Sat and this morning. Pt sts that 2 of the episodes occurred at night and she was awoken out if her sleep. Each episode required the use of 2 Nitro before the pain subsided. Pt sts that the pain does not radiate, is not associated with activity, she denies n/v, diaphoresis.  Pt sts that she has had increased sob the last 4 weeks. She denies fever or cough. Pt daughter is at home with her today. Adv the pt that due to COVID we are limiting in office visits. Pt is agreeable with seeing a PA/NP and I will call back to discuss. Adv the pt that I will fwd a msg to our PA to determine need for in office appt. Pt is to go to the ED for reoccurring/woresening chest pain

## 2019-02-25 NOTE — Telephone Encounter (Signed)
Patient will need multiple troponin levels drawn given pain this morning as well as imaging. I recommend ED evaluation given symptoms.

## 2019-02-26 NOTE — Telephone Encounter (Signed)
Spoke with patient and confirmed her upcoming appointment and she had no further questions at this time.

## 2019-02-26 NOTE — Telephone Encounter (Signed)
Patient states she went to the ED yesterday and they advised her to call Dr. Rockey Situ and discuss her visit. Please call.

## 2019-02-26 NOTE — Telephone Encounter (Signed)
Virtual Visit Pre-Appointment Phone Call  "(Name), I am calling you today to discuss your upcoming appointment. We are currently trying to limit exposure to the virus that causes COVID-19 by seeing patients at home rather than in the office."  1. "What is the BEST phone number to call the day of the visit?" - include this in appointment notes  2. Do you have or have access to (through a family member/friend) a smartphone with video capability that we can use for your visit?" a. If yes - list this number in appt notes as cell (if different from BEST phone #) and list the appointment type as a VIDEO visit in appointment notes b. If no - list the appointment type as a PHONE visit in appointment notes  3. Confirm consent - "In the setting of the current Covid19 crisis, you are scheduled for a (phone or video) visit with your provider on (date) at (time).  Just as we do with many in-office visits, in order for you to participate in this visit, we must obtain consent.  If you'd like, I can send this to your mychart (if signed up) or email for you to review.  Otherwise, I can obtain your verbal consent now.  All virtual visits are billed to your insurance company just like a normal visit would be.  By agreeing to a virtual visit, we'd like you to understand that the technology does not allow for your provider to perform an examination, and thus may limit your provider's ability to fully assess your condition. If your provider identifies any concerns that need to be evaluated in person, we will make arrangements to do so.  Finally, though the technology is pretty good, we cannot assure that it will always work on either your or our end, and in the setting of a video visit, we may have to convert it to a phone-only visit.  In either situation, we cannot ensure that we have a secure connection.  Are you willing to proceed?" STAFF: Did the patient verbally acknowledge consent to telehealth visit? Document  YES/NO here: yes  4. Advise patient to be prepared - "Two hours prior to your appointment, go ahead and check your blood pressure, pulse, oxygen saturation, and your weight (if you have the equipment to check those) and write them all down. When your visit starts, your provider will ask you for this information. If you have an Apple Watch or Kardia device, please plan to have heart rate information ready on the day of your appointment. Please have a pen and paper handy nearby the day of the visit as well."  5. Give patient instructions for MyChart download to smartphone OR Doximity/Doxy.me as below if video visit (depending on what platform provider is using)  6. Inform patient they will receive a phone call 15 minutes prior to their appointment time (may be from unknown caller ID) so they should be prepared to answer    TELEPHONE CALL NOTE  [name] has been deemed a candidate for a follow-up tele-health visit to limit community exposure during the Covid-19 pandemic. I spoke with the patient via phone to ensure availability of phone/video source, confirm preferred email & phone number, and discuss instructions and expectations.  I reminded OLAMIDE LAHAIE to be prepared with any vital sign and/or heart rhythm information that could potentially be obtained via home monitoring, at the time of her visit. I reminded MARIYAM REMINGTON to expect a phone call prior to  her visit.  Clarisse Gouge 02/26/2019 11:54 AM   INSTRUCTIONS FOR DOWNLOADING THE MYCHART APP TO SMARTPHONE  - The patient must first make sure to have activated MyChart and know their login information - If Apple, go to [employer] and type in MyChart in the search bar and download the app. If Android, ask patient to go to [employer] and type in [place] in the search bar and download the app. The app is free but as with any other app downloads, their phone may require them to verify saved payment information or  Apple/Android password.  - The patient will need to then log into the app with their MyChart username and password, and select [facility] as their healthcare provider to link the account. When it is time for your visit, go to the MyChart app, find appointments, and click Begin Video Visit. Be sure to Select Allow for your device to access the Microphone and Camera for your visit. You will then be connected, and your provider will be with you shortly.  **If they have any issues connecting, or need assistance please contact MyChart service desk [phone])**  **If using a computer, in order to ensure the best quality for their visit they will need to use either of the following Internet Browsers: [employer], or Google Chrome**  IF USING DOXIMITY or DOXY.ME - The patient will receive a link just prior to their visit by text.     FULL LENGTH CONSENT FOR TELE-HEALTH VISIT   I hereby voluntarily request, consent and authorize [facility] and its employed or contracted physicians, physician assistants, nurse practitioners or other licensed health care professionals (the Practitioner), to provide me with telemedicine health care services (the Services") as deemed necessary by the treating Practitioner. I acknowledge and consent to receive the Services by the Practitioner via telemedicine. I understand that the telemedicine visit will involve communicating with the Practitioner through live audiovisual communication technology and the disclosure of certain medical information by electronic transmission. I acknowledge that I have been given the opportunity to request an in-person assessment or other available alternative prior to the telemedicine visit and am voluntarily participating in the telemedicine visit.  I understand that I have the right to withhold or withdraw my consent to the use of telemedicine in the course of my care at any time, without affecting my right to future care  or treatment, and that the Practitioner or I may terminate the telemedicine visit at any time. I understand that I have the right to inspect all information obtained and/or recorded in the course of the telemedicine visit and may receive copies of available information for a reasonable fee.  I understand that some of the potential risks of receiving the Services via telemedicine include:   Delay or interruption in medical evaluation due to technological equipment failure or disruption;  Information transmitted may not be sufficient (e.g. poor resolution of images) to allow for appropriate medical decision making by the Practitioner; and/or   In rare instances, security protocols could fail, causing a breach of personal health information.  Furthermore, I acknowledge that it is my responsibility to provide information about my medical history, conditions and care that is complete and accurate to the best of my ability. I acknowledge that Practitioner's advice, recommendations, and/or decision may be based on factors not within their control, such as incomplete or inaccurate data provided by me or distortions of diagnostic images or specimens that may result from electronic transmissions. I  understand that the practice of medicine is not an exact science and that Practitioner makes no warranties or guarantees regarding treatment outcomes. I acknowledge that I will receive a copy of this consent concurrently upon execution via email to the email address I last provided but may also request a printed copy by calling the office of [facility].    I understand that my insurance will be billed for this visit.   I have read or had this consent read to me.  I understand the contents of this consent, which adequately explains the benefits and risks of the Services being provided via telemedicine.   I have been provided ample opportunity to ask questions regarding this consent and the Services and have had  my questions answered to my satisfaction.  I give my informed consent for the services to be provided through the use of telemedicine in my medical care  By participating in this telemedicine visit I agree to the above.

## 2019-02-26 NOTE — Telephone Encounter (Signed)
-----   Message from Minna Merritts, MD sent at 02/25/2019  3:32 PM EDT ----- Seen in the emergency room for chest pain Needs E visit to discuss Might need repeat stress test if she feels symptoms are worse Thx TG

## 2019-02-26 NOTE — Telephone Encounter (Signed)
Per note from Ed patient needs appt   Scheduled 6/16 at 340 with [facility] for Evisit

## 2019-03-04 ENCOUNTER — Other Ambulatory Visit: Payer: Self-pay | Admitting: Internal Medicine

## 2019-03-05 ENCOUNTER — Other Ambulatory Visit: Payer: Self-pay

## 2019-03-05 ENCOUNTER — Telehealth (INDEPENDENT_AMBULATORY_CARE_PROVIDER_SITE_OTHER): Payer: Medicare Other | Admitting: Cardiovascular Disease

## 2019-03-05 DIAGNOSIS — R079 Chest pain, unspecified: Secondary | ICD-10-CM

## 2019-03-05 DIAGNOSIS — I25118 Atherosclerotic heart disease of native coronary artery with other forms of angina pectoris: Secondary | ICD-10-CM

## 2019-03-05 DIAGNOSIS — R0602 Shortness of breath: Secondary | ICD-10-CM

## 2019-03-05 DIAGNOSIS — R9431 Abnormal electrocardiogram [ECG] [EKG]: Secondary | ICD-10-CM

## 2019-03-05 DIAGNOSIS — J438 Other emphysema: Secondary | ICD-10-CM | POA: Diagnosis not present

## 2019-03-05 DIAGNOSIS — Z951 Presence of aortocoronary bypass graft: Secondary | ICD-10-CM

## 2019-03-05 DIAGNOSIS — E782 Mixed hyperlipidemia: Secondary | ICD-10-CM | POA: Diagnosis not present

## 2019-03-05 MED ORDER — ISOSORBIDE MONONITRATE ER 30 MG PO TB24: 30.0000 mg | ORAL_TABLET | Freq: Two times a day (BID) | ORAL | 3 refills | Status: DC

## 2019-03-05 MED ORDER — NITROGLYCERIN 0.4 MG SL SUBL: SUBLINGUAL_TABLET | SUBLINGUAL | 1 refills | Status: DC

## 2019-03-05 NOTE — Progress Notes (Signed)
Virtual Visit via Telephone Note   This visit type was conducted due to national recommendations for restrictions regarding the COVID-19 Pandemic (e.g. social distancing) in an effort to limit this patient's exposure and mitigate transmission in our community.  Due to her co-morbid illnesses, this patient is at least at moderate risk for complications without adequate follow up.  This format is felt to be most appropriate for this patient at this time.  The patient did not have access to video technology/had technical difficulties with video requiring transitioning to audio format only (telephone).  All issues noted in this document were discussed and addressed.  No physical exam could be performed with this format.  Please refer to the patient's chart for her  consent to telehealth for [facility].   I connected with  [name] on 03/05/19 by a video enabled telemedicine application and verified that I am speaking with the correct person using two identifiers. I discussed the limitations of evaluation and management by telemedicine. The patient expressed understanding and agreed to proceed.   Evaluation Performed:  Follow-up visit  Date:  03/05/2019   ID:  [name], DOB [birth year], MRN [id number]  Patient Location:  [place] [address]   Provider location:   [facility], [place] office  PCP:  [name], MD  Cardiologist:  Patsy Baltimore   Chief Complaint:  Chest pain   History of Present Illness:    [name] is a 85 y.o. female who presents via audio/video conferencing for a telehealth visit today.   The patient does not symptoms concerning for COVID-19 infection (fever, chills, cough, or new SHORTNESS OF BREATH).   Patient has a past medical history of CAD, bypass surgery in 1999,Stress test October 2017 with no ischemia COPD, Smoking for 40 years who quit in 1985,  chronic renal insufficiency,   hypertension,  hyperlipidemia,  40-50% bilateral carotid arterial disease  EF >60% in 05/2015 C. difficile in January 2015. H/o fall wet floor  02/2017, Suffered a pelvic fracture, left shoulder pain who presents for routine followup of her coronary artery disease  Seen in the emergency room February 25, 2019 Chest pain Relieved with nitro x2  Very SOB, still having chest pain No lung problems Feels like sx happening for 10 days Taking a lot of nitro Pain when in bed Played golf, no pain When active, no pain Steps brings on something, different than night time pain  Prior stress test April 2019 low risk study  Previously had chemotherapy for cancer on right lower extremity  Lab work reviewed from last year total cholesterol 108  EKG reviewed from the hospital emergency room, nonspecific T wave abnormality anterolateral leads No significant change from previous EKG  Of the past medical history reviewed Hospital admission 12/21/2016 for COPD exacerbation Syncope, dehydrated She received steroids,duo nebs, ABX  On previous office visits, reported having left-sided chest pain and shoulder pain Declined stress testing or catheterization at that time.  On a previous clinic visit had chest pain, took aspirin and nitroglycerin, after several hours symptoms resolved.   She has not had any intervention since her bypass surgery. No cardiac catheterizations, no stent placement. C. difficile in January 2015. She was in the hospital for several days.  Last stress test was October 2011 and September 2015 , 2017 Carotid ultrasound showing 40-59% disease    Prior CV studies:   The following studies were reviewed today:  Past Medical History:  Diagnosis Date  . C. difficile colitis   . CAD (coronary artery disease)    a. Nuclear 10/11, not gated, no scar or ischemia, EF 68%, low risk study; b. [place] 06/18/14: no evidence of infarction or ischemia, no WMA,  normalization of TWI in precordial leads, equivocal EKG changes EF 77%, low risk study  . Carotid artery disease ([place])    Doppler January, 2012, 40-59% bilateral, followup one year  . Closed fracture pubis ([place]) 07/04/2017  . COPD (chronic obstructive pulmonary disease) ([place])   . Diffuse cystic mastopathy   . Diverticulitis    last colonoscopy  incomplete Jan 2011  . Dizziness    stable now (Oct 2011) patient tells me question of TIA, we will obtain records  . Gait abnormality    Patient complains of "wobbly gait", December, 2013  . GERD (gastroesophageal reflux disease)   . History of migraines   . Hx of CABG    a. 3 vessel in 1999  . Hyperlipidemia   . Hypertension   . Indigestion    3 weeks, October 2011  . Kidney stones   . Rheumatic fever   . Sinus bradycardia    Mild, December, 2013  . SOB (shortness of breath)   . Syncopal episodes    after 18 holes of golf and increase hydrochlorothiazide, resolved    Past Surgical History:  Procedure Laterality Date  . APPENDECTOMY  1950  . BREAST BIOPSY Right 1994  . BREAST CYST ASPIRATION     multiple BIL  . CATARACT EXTRACTION Right 2009  . CATARACT EXTRACTION Left 2011  . COLONOSCOPY  [id number]   Dr. Jamal Collin  . CORONARY ARTERY BYPASS GRAFT  1999  . esophagus stretched    . TONSILLECTOMY  1939  . TOTAL ABDOMINAL HYSTERECTOMY  1968   due to metrorrhagia     No outpatient medications have been marked as taking for the 03/05/19 encounter (Telemedicine) with Minna Merritts, MD.     Allergies:   Codeine   Social History   Tobacco Use  . Smoking status: Former Smoker    Packs/day: 0.50    Years: 40.00    Pack years: 20.00    Types: Cigarettes    Quit date: 09/20/1983    Years since quitting: 35.4  . Smokeless tobacco: Never Used  Substance Use Topics  . Alcohol use: Yes    Comment: yes, social wine  . Drug use: No     Current Outpatient Medications on File Prior to Visit  Medication Sig Dispense Refill  .  amLODipine (NORVASC) 2.5 MG tablet TAKE ONE TABLET EVERY DAY 90 tablet 1  . clopidogrel (PLAVIX) 75 MG tablet TAKE ONE TABLET EVERY DAY 90 tablet 2  . escitalopram (LEXAPRO) 10 MG tablet Take 1 tablet (10 mg total) by mouth daily. 30 tablet 2  . esomeprazole (NEXIUM) 40 MG capsule TAKE 1 CAPSULE DAILY BEFORE BREAKFAST 90 capsule 1  . fluticasone (FLONASE) 50 MCG/ACT nasal spray Place 2 sprays into both nostrils daily. 16 g 2  . Ipratropium-Albuterol (COMBIVENT RESPIMAT) 20-100 MCG/ACT AERS respimat Inhale 1 puff into the lungs every 6 (six) hours as needed for wheezing. 4 g 5  . LORazepam (ATIVAN) 0.5 MG tablet TAKE 1 1/2 TABLETS AT BEDTIME AS NEEDED FOR INSOMNIA 45 tablet 0  . losartan (COZAAR) 100 MG tablet TAKE ONE TABLET EVERY DAY 90 tablet 2  . metoprolol succinate (TOPROL-XL) 25 MG 24 hr tablet Take 1 tablet (25 mg  total) by mouth daily. Take with or immediately following a meal. 90 tablet 3  . simvastatin (ZOCOR) 40 MG tablet TAKE ONE TABLET AT BEDTIME 90 tablet 1   No current facility-administered medications on file prior to visit.      Family Hx: The patient's family history includes Heart disease in her father and mother. There is no history of Cancer, Drug abuse, or Breast cancer.  ROS:   Please see the history of present illness.    Review of Systems  Constitutional: Negative.   HENT: Negative.   Respiratory: Negative.   Cardiovascular: Negative.   Gastrointestinal: Negative.   Musculoskeletal: Negative.   Neurological: Negative.   Psychiatric/Behavioral: Negative.   All other systems reviewed and are negative.     Labs/Other Tests and Data Reviewed:    Recent Labs: 02/25/2019: BUN 20; Creatinine, Ser 1.07; Hemoglobin 13.6; Platelets 155; Potassium 4.1; Sodium 142   Recent Lipid Panel Lab Results  Component Value Date/Time   CHOL 109 12/08/2016 08:52 AM   CHOL 143 06/18/2014 04:19 AM   TRIG 122.0 12/08/2016 08:52 AM   TRIG 126 06/18/2014 04:19 AM   HDL 31.30 (L)  12/08/2016 08:52 AM   HDL 46 06/18/2014 04:19 AM   CHOLHDL 3 12/08/2016 08:52 AM   LDLCALC 53 12/08/2016 08:52 AM   LDLCALC 72 06/18/2014 04:19 AM   LDLDIRECT 72.0 11/09/2015 12:09 PM    Wt Readings from Last 3 Encounters:  02/25/19 130 lb (59 kg)  02/05/19 133 lb 3.2 oz (60.4 kg)  08/06/18 133 lb (60.3 kg)     Exam:    Vital Signs: Vital signs may also be detailed in the HPI There were no vitals taken for this visit.  Wt Readings from Last 3 Encounters:  02/25/19 130 lb (59 kg)  02/05/19 133 lb 3.2 oz (60.4 kg)  08/06/18 133 lb (60.3 kg)   Temp Readings from Last 3 Encounters:  02/25/19 98 F (36.7 C) (Oral)  02/05/19 98.2 F (36.8 C) (Oral)  07/10/18 98.4 F (36.9 C) (Oral)   BP Readings from Last 3 Encounters:  02/25/19 (!) 163/56  02/05/19 (!) 148/80  08/06/18 (!) 150/70   Pulse Readings from Last 3 Encounters:  02/25/19 60  02/05/19 67  08/06/18 86    Previous blood pressure elevated in the emergency room, she has not rechecked it since that time Reviewed previous blood pressures with her, sometimes elevated 283 up to 662 systolic heart rate 94T respirations 16  Well nourished, well developed female in no acute distress. Constitutional:  oriented to person, place, and time. No distress.    ASSESSMENT & PLAN:    Coronary artery disease of native artery of native heart with stable angina pectoris ([place]) -  Long discussion with her concerning various treatment options Taking more sublingual nitroglycerin for chest pain symptoms We did offer cardiac catheterization, stress testing, medical management with isosorbide increase She is not particularly interested in cardiac catheterization right now, she and her daughter are concerned about her age and possible complications from invasive study At this time we suggested isosorbide up to 30 twice daily and will repeat pharmacologic Myoview If this shows large region of ischemia may need to do cardiac catheterization  despite risks She is in agreement  Hx of CABG Stress test ordered as above We will increase isosorbide 30 twice daily  Hyperlipidemia Cholesterol is at goal on the current lipid regimen. No changes to the medications were made.     COVID-19 Education: The signs  and symptoms of COVID-19 were discussed with the patient and how to seek care for testing (follow up with PCP or arrange E-visit).  The importance of social distancing was discussed today.  Patient Risk:   After full review of this patients clinical status, I feel that they are at least moderate risk at this time.  Time:   Today, I have spent 25 minutes with the patient with telehealth technology discussing the cardiac and medical problems/diagnoses detailed above   10 min spent reviewing the chart prior to patient visit today   Medication Adjustments/Labs and Tests Ordered: Current medicines are reviewed at length with the patient today.  Concerns regarding medicines are outlined above.   Tests Ordered: No tests ordered   Medication Changes: No changes made   Disposition: Follow-up in 6 months    Signed, Ida Rogue, MD  03/05/2019 5:24 PM    [place] Office [address], [place], [place] [address]

## 2019-03-05 NOTE — Telephone Encounter (Signed)
Refilled: 12/21/2018 Last OV: 07/10/2018 Next OV: 03/25/2019

## 2019-03-05 NOTE — Patient Instructions (Addendum)
Medication Instructions:  Your physician has recommended you make the following change in your medication:  1. INCREASE Isosorbide mononitrate to 30 mg twice a day    If you need a refill on your cardiac medications before your next appointment, please call your pharmacy.    Lab work: No new labs needed   If you have labs (blood work) drawn today and your tests are completely normal, you will receive your results only by: Marland Kitchen MyChart Message (if you have MyChart) OR . A paper copy in the mail If you have any lab test that is abnormal or we need to change your treatment, we will call you to review the results.   Testing/Procedures: [place]  Your caregiver has ordered a Stress Test with nuclear imaging. The purpose of this test is to evaluate the blood supply to your heart muscle. This procedure is referred to as a "Non-Invasive Stress Test." This is because other than having an IV started in your vein, nothing is inserted or "invades" your body. Cardiac stress tests are done to find areas of poor blood flow to the heart by determining the extent of coronary artery disease (CAD). Some patients exercise on a treadmill, which naturally increases the blood flow to your heart, while others who are  unable to walk on a treadmill due to physical limitations have a pharmacologic/chemical stress agent called Lexiscan . This medicine will mimic walking on a treadmill by temporarily increasing your coronary blood flow.   Please note: these test may take anywhere between 2-4 hours to complete  PLEASE REPORT TO [facility] AT THE FIRST DESK WILL DIRECT YOU WHERE TO GO  Date of Procedure:__Thursday June 18th__  Arrival Time for Procedure:_08:15AM__  Instructions regarding medication:   _XX___:  Hold metoprolol the morning of procedure   PLEASE NOTIFY THE OFFICE AT LEAST 24 HOURS IN ADVANCE IF YOU ARE UNABLE TO KEEP YOUR APPOINTMENT.  [phone] AND  PLEASE  NOTIFY NUCLEAR MEDICINE AT [facility] AT LEAST 24 HOURS IN ADVANCE IF YOU ARE UNABLE TO KEEP YOUR APPOINTMENT. [phone]  How to prepare for your Myoview test:  1. Do not eat or drink after midnight 2. No caffeine for 24 hours prior to test 3. No smoking 24 hours prior to test. 4. Your medication may be taken with water.  If your doctor stopped a medication because of this test, do not take that medication. 5. Ladies, please do not wear dresses.  Skirts or pants are appropriate. Please wear a short sleeve shirt. 6. No perfume, cologne or lotion. 7. Wear comfortable walking shoes. No heels!   Follow-Up: At [facility], you and your health needs are our priority.  As part of our continuing mission to provide you with exceptional heart care, we have created designated Provider Care Teams.  These Care Teams include your primary Cardiologist (physician) and Advanced Practice Providers (APPs -  Physician Assistants and Nurse Practitioners) who all work together to provide you with the care you need, when you need it.  . You will need a follow up appointment 3 months  . Providers on your designated Care Team:   . Murray Hodgkins, NP . Christell Faith, PA-C . Marrianne Mood, PA-C  Any Other Special Instructions Will Be Listed Below (If Applicable).  For educational health videos Log in to : [email or web address] Or : [email or web address], password : [email or web address]

## 2019-03-07 ENCOUNTER — Other Ambulatory Visit: Payer: Self-pay

## 2019-03-07 ENCOUNTER — Ambulatory Visit
Admission: RE | Admit: 2019-03-07 | Discharge: 2019-03-07 | Disposition: A | Discharge status: Home / Self Care | Payer: Medicare Other | Source: Ambulatory Visit | Attending: Cardiovascular Disease | Admitting: Cardiovascular Disease

## 2019-03-07 DIAGNOSIS — Z951 Presence of aortocoronary bypass graft: Secondary | ICD-10-CM | POA: Diagnosis not present

## 2019-03-07 DIAGNOSIS — I25118 Atherosclerotic heart disease of native coronary artery with other forms of angina pectoris: Secondary | ICD-10-CM | POA: Diagnosis not present

## 2019-03-07 LAB — NM MYOCAR MULTI W/SPECT W/WALL MOTION / EF
Estimated workload: 1 METS
Exercise duration (min): 0 min
Exercise duration (sec): 0 s
LV dias vol: 35 mL (ref 46–106)
LV sys vol: 11 mL
MPHR: 130 {beats}/min
Peak HR: 107 {beats}/min
Percent HR: 82 %
Rest HR: 71 {beats}/min
SDS: 9
SRS: 1
SSS: 9
TID: 0.71

## 2019-03-07 MED ORDER — REGADENOSON 0.4 MG/5ML IV SOLN
0.4000 mg | Freq: Once | INTRAVENOUS | Status: AC
  Administered 2019-03-07: 0.4 mg via INTRAVENOUS

## 2019-03-07 MED ORDER — TECHNETIUM TC 99M TETROFOSMIN IV KIT
9.9600 | PACK | Freq: Once | INTRAVENOUS | Status: AC | PRN
  Administered 2019-03-07: 9.96 via INTRAVENOUS

## 2019-03-07 MED ORDER — TECHNETIUM TC 99M TETROFOSMIN IV KIT
30.5600 | PACK | Freq: Once | INTRAVENOUS | Status: AC | PRN
  Administered 2019-03-07: 30.56 via INTRAVENOUS

## 2019-03-11 ENCOUNTER — Telehealth: Payer: Self-pay | Admitting: Cardiovascular Disease

## 2019-03-11 NOTE — Telephone Encounter (Signed)
° ° °  Please call with stress test results °

## 2019-03-12 NOTE — Telephone Encounter (Signed)
Call to patient to review results of stress test. No new orders following stress test.  Pt verbalized understanding and requested visit with Dr. Rockey Situ d/t ongoing sx of SOB. She is taking Imdur and NTG PRN.  SOB worse with exertion.   OV scheduled for 6/30 @11 :4, daughter [name] will accompany her.  Pt requested I call daughter for update.  I made her aware of results and upcoming appt.   She voiced understanding.   Office procedures for CV 19 reviewed.

## 2019-03-13 ENCOUNTER — Telehealth: Payer: Self-pay | Admitting: Internal Medicine

## 2019-03-13 ENCOUNTER — Telehealth: Payer: Self-pay

## 2019-03-13 NOTE — Telephone Encounter (Signed)
Pt returned call regarding message left on her phone. Message read to her from telephone encounter from  [place] regarding stress test. Pt voiced understanding.

## 2019-03-13 NOTE — Telephone Encounter (Signed)
-----   Message from Minna Merritts, MD sent at 03/12/2019 11:54 AM EDT ----- Stress test results No large regions of ischemia concerning for blockage, normal cardiac function and wall motion If pain can be controlled with increasing doses of isosorbide would perhaps use medications If angina cannot be controlled with increasing doses of isosorbide, we may have to perform cardiac catheterization for symptoms

## 2019-03-13 NOTE — Telephone Encounter (Signed)
Error

## 2019-03-13 NOTE — Telephone Encounter (Signed)
LMTCB. Need to schedule pt for a follow up appt with Dr. Tullo.  

## 2019-03-16 ENCOUNTER — Other Ambulatory Visit: Payer: Self-pay | Admitting: Internal Medicine

## 2019-03-17 NOTE — Progress Notes (Signed)
Cardiology Office Note  Date:  03/19/2019   ID:  [name], DOB 02/25/1926, MRN [id number]  PCP:  [name], MD   Chief Complaint  Patient presents with  . other    Patient c/o SOB. Meds reviewed verbally with patient.     HPI:  [name] is a pleasant 85 year old woman with a history of  CAD, bypass surgery in 1999,Stress test October 2017 with no ischemia COPD, Smoking for 40 years who quit in 1985,  chronic renal insufficiency,  hypertension,  hyperlipidemia,  40-50% bilateral carotid arterial disease  EF >60% in 05/2015  C. difficile in January 2015. H/o fall wet floor  02/2017, Suffered a pelvic fracture, left shoulder pain who presents for routine followup of her coronary artery disease  Daughter present with her on today's visit in the office  Recent episodes of chest pain Seen in the ER Stress test 1 week later: no ischemia  Pharmacological myocardial perfusion imaging study with no significant ischemia Unable to exclude mild ischemia lateral wall on non-attenuation corrected images Normal wall motion, EF estimated at 56% EKG with equivocal ST abnormality V2 through V5 following infusion of lexiscan.  Patient did report chest pain following infusion. Rare PVC noted Moderate risk scan , secondary to equivocal EKG changes, chest pain during the study  imdur increased up to 30 twice daily Feels the increase in medication has helped but occasionally with chest pain at 5 AM, gets nervous in general living by herself Has not been active secondary to viral quarantine Thinks that is playing a role as well Like to be able to take a nitro sublingual as needed Some insomnia, has not been taking her Ativan  EKG personally reviewed by myself on todays visit Shows normal sinus rhythm with rate 58 bpm T wave abnormality precordial leads  Previously treated with chemotherapy for cancer on right lower extremity  Other past medical history reviewed Previous  stress test April 2019 reviewed with her showing no significant ischemia  Hospital admission 12/21/2016 for COPD exacerbation Syncope, dehydrated She received steroids,duo nebs, ABX   PMH:   has a past medical history of C. difficile colitis, CAD (coronary artery disease), Carotid artery disease ([place]), Closed fracture pubis ([place]) (07/04/2017), COPD (chronic obstructive pulmonary disease) ([place]), Diffuse cystic mastopathy, Diverticulitis, Dizziness, Gait abnormality, GERD (gastroesophageal reflux disease), History of migraines, CABG, Hyperlipidemia, Hypertension, Indigestion, Kidney stones, Rheumatic fever, Sinus bradycardia, SOB (shortness of breath), and Syncopal episodes.  PSH:    Past Surgical History:  Procedure Laterality Date  . APPENDECTOMY  1950  . BREAST BIOPSY Right 1994  . BREAST CYST ASPIRATION     multiple BIL  . CATARACT EXTRACTION Right 2009  . CATARACT EXTRACTION Left 2011  . COLONOSCOPY  [id number]   Dr. Jamal Collin  . CORONARY ARTERY BYPASS GRAFT  1999  . esophagus stretched    . TONSILLECTOMY  1939  . TOTAL ABDOMINAL HYSTERECTOMY  1968   due to metrorrhagia    Current Outpatient Medications  Medication Sig Dispense Refill  . amLODipine (NORVASC) 2.5 MG tablet TAKE 1 TABLET BY MOUTH DAILY 90 tablet 1  . clopidogrel (PLAVIX) 75 MG tablet TAKE ONE TABLET EVERY DAY 90 tablet 2  . esomeprazole (NEXIUM) 40 MG capsule TAKE 1 CAPSULE DAILY BEFORE BREAKFAST 90 capsule 1  . fluticasone (FLONASE) 50 MCG/ACT nasal spray Place 2 sprays into both nostrils daily. 16 g 2  . Ipratropium-Albuterol (COMBIVENT RESPIMAT) 20-100 MCG/ACT AERS respimat Inhale 1 puff into the lungs every 6 (  six) hours as needed for wheezing. 4 g 5  . isosorbide mononitrate (IMDUR) 30 MG 24 hr tablet Take 1 tablet (30 mg total) by mouth 2 (two) times a day. 180 tablet 3  . LORazepam (ATIVAN) 0.5 MG tablet TAKE ONE AND A HALF TABLETS BY MOUTH AS NEEDED FOR INSOMNIA 45 tablet 0  . losartan (COZAAR) 100 MG tablet  TAKE ONE TABLET EVERY DAY 90 tablet 2  . metoprolol succinate (TOPROL-XL) 25 MG 24 hr tablet Take 1 tablet (25 mg total) by mouth daily. Take with or immediately following a meal. 90 tablet 3  . nitroGLYCERIN (NITROSTAT) 0.4 MG SL tablet PLACE 1 TABLET UNDER TONGUE EVERY 5 MIN AS NEEDED FOR CHEST PAIN IF NO RELIEF IN15 MIN CALL 911 (MAX 3 TABS) 25 tablet 1  . simvastatin (ZOCOR) 40 MG tablet TAKE ONE TABLET AT BEDTIME 90 tablet 1   No current facility-administered medications for this visit.      Allergies:   Codeine   Social History:  The patient  reports that she quit smoking about 35 years ago. Her smoking use included cigarettes. She has a 20.00 pack-year smoking history. She has never used smokeless tobacco. She reports current alcohol use. She reports that she does not use drugs.   Family History:   family history includes Heart disease in her father and mother.    Review of Systems: Review of Systems  Constitutional: Negative.   Cardiovascular: Positive for chest pain.  Gastrointestinal: Negative.   Musculoskeletal: Positive for joint pain.  Psychiatric/Behavioral: Negative.   All other systems reviewed and are negative.    PHYSICAL EXAM: VS:  BP 132/70 (BP Location: Left Arm, Patient Position: Sitting, Cuff Size: Normal)   Pulse (!) 58   Ht 5\' 3"  (1.6 m)   Wt 128 lb (58.1 kg)   BMI 22.67 kg/m  , BMI Body mass index is 22.67 kg/m. Constitutional:  oriented to person, place, and time. No distress.  HENT:  Head: Grossly normal Eyes:  no discharge. No scleral icterus.  Neck: No JVD, no carotid bruits  Cardiovascular: Regular rate and rhythm, no murmurs appreciated Pulmonary/Chest: Clear to auscultation bilaterally, no wheezes or rails Abdominal: Soft.  no distension.  no tenderness.  Musculoskeletal: Normal range of motion Neurological:  normal muscle tone. Coordination normal. No atrophy Skin: Skin warm and dry Psychiatric: normal affect, pleasant  Recent  Labs: 02/25/2019: BUN 20; Creatinine, Ser 1.07; Hemoglobin 13.6; Platelets 155; Potassium 4.1; Sodium 142    Lipid Panel Lab Results  Component Value Date   CHOL 109 12/08/2016   HDL 31.30 (L) 12/08/2016   LDLCALC 53 12/08/2016   TRIG 122.0 12/08/2016      Wt Readings from Last 3 Encounters:  03/19/19 128 lb (58.1 kg)  02/25/19 130 lb (59 kg)  02/05/19 133 lb 3.2 oz (60.4 kg)     ASSESSMENT AND PLAN:  Essential hypertension -  For stable angina symptoms we will increase isosorbide 30 mg in the morning 60 mg in the evening  Coronary artery disease involving native coronary artery of native heart without angina pectoris -  Recent stress test no significant ischemia She does not want heart catheterization, prefers medical management Increase isosorbide to 30 mg morning 60 mg in the evening Also discussed possibly starting ranexa if she continues to have symptoms Recommend she use Ativan for anxiety and insomnia Nitroglycerin sublingual for any breakthrough pain  History of bypass surgery Plan as above  Carotid stenosis Less than 39% bilaterally, significant  plaque noted Cholesterol at goal  Hyperlipidemia Repeat lab work with primary care Previously at goal on Zocor 40    Total encounter time more than 25 minutes  Greater than 50% was spent in counseling and coordination of care with the patient   Disposition:   F/U  6 months   No orders of the defined types were placed in this encounter.    Signed, Esmond Plants, M.D., Ph.D. 03/19/2019  [place], [place]

## 2019-03-18 ENCOUNTER — Telehealth: Payer: Self-pay | Admitting: Cardiovascular Disease

## 2019-03-18 NOTE — Telephone Encounter (Signed)

## 2019-03-19 ENCOUNTER — Other Ambulatory Visit: Payer: Self-pay

## 2019-03-19 ENCOUNTER — Ambulatory Visit (INDEPENDENT_AMBULATORY_CARE_PROVIDER_SITE_OTHER): Payer: Medicare Other | Admitting: Cardiovascular Disease

## 2019-03-19 ENCOUNTER — Encounter: Payer: Self-pay | Admitting: Cardiovascular Disease

## 2019-03-19 VITALS — BP 132/70 | HR 58 | Ht 63.0 in | Wt 128.0 lb

## 2019-03-19 DIAGNOSIS — I25118 Atherosclerotic heart disease of native coronary artery with other forms of angina pectoris: Secondary | ICD-10-CM

## 2019-03-19 DIAGNOSIS — I10 Essential (primary) hypertension: Secondary | ICD-10-CM

## 2019-03-19 DIAGNOSIS — Z951 Presence of aortocoronary bypass graft: Secondary | ICD-10-CM

## 2019-03-19 DIAGNOSIS — I700 Atherosclerosis of aorta: Secondary | ICD-10-CM

## 2019-03-19 DIAGNOSIS — N183 Chronic kidney disease, stage 3 unspecified: Secondary | ICD-10-CM

## 2019-03-19 DIAGNOSIS — I6523 Occlusion and stenosis of bilateral carotid arteries: Secondary | ICD-10-CM

## 2019-03-19 DIAGNOSIS — E782 Mixed hyperlipidemia: Secondary | ICD-10-CM | POA: Diagnosis not present

## 2019-03-19 MED ORDER — ISOSORBIDE MONONITRATE ER 30 MG PO TB24: 30.0000 mg | ORAL_TABLET | Freq: Every day | ORAL | 3 refills | Status: DC

## 2019-03-19 NOTE — Patient Instructions (Signed)
Medication Instructions:  Please increase the isosorbide up to 30 mg in the AM and 60 mg in the PM (take 2 of the 30 mg in the PM)  Ok to take sleeping pill OK to take nitro SL for pain   If pain continues, we might need RANEXA  If you need a refill on your cardiac medications before your next appointment, please call your pharmacy.    Lab work: No new labs needed   If you have labs (blood work) drawn today and your tests are completely normal, you will receive your results only by: Marland Kitchen MyChart Message (if you have MyChart) OR . A paper copy in the mail If you have any lab test that is abnormal or we need to change your treatment, we will call you to review the results.   Testing/Procedures: No new testing needed   Follow-Up: At [facility], you and your health needs are our priority.  As part of our continuing mission to provide you with exceptional heart care, we have created designated Provider Care Teams.  These Care Teams include your primary Cardiologist (physician) and Advanced Practice Providers (APPs -  Physician Assistants and Nurse Practitioners) who all work together to provide you with the care you need, when you need it.  . You will need a follow up appointment in 6 months .   Please call our office 2 months in advance to schedule this appointment.    . Providers on your designated Care Team:   . Murray Hodgkins, NP . Christell Faith, PA-C . Marrianne Mood, PA-C  Any Other Special Instructions Will Be Listed Below (If Applicable).  For educational health videos Log in to : [email or web address] Or : [email or web address], password : [email or web address]

## 2019-03-25 ENCOUNTER — Encounter: Payer: Self-pay | Admitting: Internal Medicine

## 2019-03-25 ENCOUNTER — Other Ambulatory Visit: Payer: Self-pay

## 2019-03-25 ENCOUNTER — Ambulatory Visit (INDEPENDENT_AMBULATORY_CARE_PROVIDER_SITE_OTHER): Payer: Medicare Other

## 2019-03-25 ENCOUNTER — Ambulatory Visit (INDEPENDENT_AMBULATORY_CARE_PROVIDER_SITE_OTHER): Payer: Medicare Other | Admitting: Internal Medicine

## 2019-03-25 DIAGNOSIS — E441 Mild protein-calorie malnutrition: Secondary | ICD-10-CM

## 2019-03-25 DIAGNOSIS — I5032 Chronic diastolic (congestive) heart failure: Secondary | ICD-10-CM

## 2019-03-25 DIAGNOSIS — J449 Chronic obstructive pulmonary disease, unspecified: Secondary | ICD-10-CM | POA: Diagnosis not present

## 2019-03-25 DIAGNOSIS — I10 Essential (primary) hypertension: Secondary | ICD-10-CM | POA: Diagnosis not present

## 2019-03-25 DIAGNOSIS — E782 Mixed hyperlipidemia: Secondary | ICD-10-CM | POA: Diagnosis not present

## 2019-03-25 DIAGNOSIS — E559 Vitamin D deficiency, unspecified: Secondary | ICD-10-CM | POA: Insufficient documentation

## 2019-03-25 DIAGNOSIS — Z0000 Encounter for general adult medical examination without abnormal findings: Secondary | ICD-10-CM | POA: Diagnosis not present

## 2019-03-25 NOTE — Progress Notes (Addendum)
Subjective:   [name] is a 85 y.o. female who presents for Medicare Annual (Subsequent) preventive examination.  Review of Systems:  No ROS.  Medicare Wellness Virtual Visit.  Visual/audio telehealth visit, UTA vital signs.   See social history for additional risk factors.   Cardiac Risk Factors include: advanced age (>6men, >33 women);hypertension     Objective:     Vitals: There were no vitals taken for this visit.  There is no height or weight on file to calculate BMI.  Advanced Directives 03/25/2019 02/25/2019 02/25/2019 03/30/2018 03/14/2018 09/13/2017 09/07/2017  Does Patient Have a Medical Advance Directive? Yes No No Yes Yes Yes Yes  Type of Paramedic of [place];Living will - - [place];Living will [place];Living will [place];Living will [place];Living will  Does patient want to make changes to medical advance directive? No - Patient declined - - - No - Patient declined No - Patient declined No - Patient declined  Copy of [place] in Chart? No - copy requested - - - No - copy requested No - copy requested No - copy requested  Would patient like information on creating a medical advance directive? - - No - Patient declined - - - -    Tobacco Social History   Tobacco Use  Smoking Status Former Smoker  . Packs/day: 0.50  . Years: 40.00  . Pack years: 20.00  . Types: Cigarettes  . Quit date: 09/20/1983  . Years since quitting: 35.5  Smokeless Tobacco Never Used     Counseling given: Not Answered   Clinical Intake:  Pre-visit preparation completed: Yes        Diabetes: No  How often do you need to have someone help you when you read instructions, pamphlets, or other written materials from your doctor or pharmacy?: 3 - Sometimes  Interpreter Needed?: No     Past Medical History:  Diagnosis Date  . C. difficile colitis   .  CAD (coronary artery disease)    a. Nuclear 10/11, not gated, no scar or ischemia, EF 68%, low risk study; b. [place] 06/18/14: no evidence of infarction or ischemia, no WMA, normalization of TWI in precordial leads, equivocal EKG changes EF 77%, low risk study  . Carotid artery disease ([place])    Doppler January, 2012, 40-59% bilateral, followup one year  . Closed fracture pubis ([place]) 07/04/2017  . COPD (chronic obstructive pulmonary disease) ([place])   . Diffuse cystic mastopathy   . Diverticulitis    last colonoscopy  incomplete Jan 2011  . Dizziness    stable now (Oct 2011) patient tells me question of TIA, we will obtain records  . Gait abnormality    Patient complains of "wobbly gait", December, 2013  . GERD (gastroesophageal reflux disease)   . History of migraines   . Hx of CABG    a. 3 vessel in 1999  . Hyperlipidemia   . Hypertension   . Indigestion    3 weeks, October 2011  . Kidney stones   . Rheumatic fever   . Sinus bradycardia    Mild, December, 2013  . SOB (shortness of breath)   . Syncopal episodes    after 18 holes of golf and increase hydrochlorothiazide, resolved    Past Surgical History:  Procedure Laterality Date  . APPENDECTOMY  1950  . BREAST BIOPSY Right 1994  . BREAST CYST ASPIRATION     multiple  BIL  . CATARACT EXTRACTION Right 2009  . CATARACT EXTRACTION Left 2011  . COLONOSCOPY  [id number]   Dr. Jamal Collin  . CORONARY ARTERY BYPASS GRAFT  1999  . esophagus stretched    . TONSILLECTOMY  1939  . TOTAL ABDOMINAL HYSTERECTOMY  1968   due to metrorrhagia   Family History  Problem Relation Age of Onset  . Heart disease Mother   . Heart disease Father   . Cancer Neg Hx   . Drug abuse Neg Hx   . Breast cancer Neg Hx    Social History   Socioeconomic History  . Marital status: Widowed    Spouse name: Not on file  . Number of children: 2  . Years of education: Not on file  . Highest education level: Not on file  Occupational History     Employer: RETIRED  Social Needs  . Financial resource strain: Not hard at all  . Food insecurity    Worry: Never true    Inability: Never true  . Transportation needs    Medical: No    Non-medical: No  Tobacco Use  . Smoking status: Former Smoker    Packs/day: 0.50    Years: 40.00    Pack years: 20.00    Types: Cigarettes    Quit date: 09/20/1983    Years since quitting: 35.5  . Smokeless tobacco: Never Used  Substance and Sexual Activity  . Alcohol use: Not Currently    Comment: yes, social wine  . Drug use: No  . Sexual activity: Never  Lifestyle  . Physical activity    Days per week: 0 days    Minutes per session: Not on file  . Stress: Not at all  Relationships  . Social Herbalist on phone: Not on file    Gets together: Not on file    Attends religious service: Not on file    Active member of club or organization: Not on file    Attends meetings of clubs or organizations: Not on file    Relationship status: Not on file  Other Topics Concern  . Not on file  Social History Narrative   Lives by herself, ambulates independently at baseline    Outpatient Encounter Medications as of 03/25/2019  Medication Sig  . amLODipine (NORVASC) 2.5 MG tablet TAKE 1 TABLET BY MOUTH DAILY  . clopidogrel (PLAVIX) 75 MG tablet TAKE ONE TABLET EVERY DAY  . esomeprazole (NEXIUM) 40 MG capsule TAKE 1 CAPSULE DAILY BEFORE BREAKFAST  . fluticasone (FLONASE) 50 MCG/ACT nasal spray Place 2 sprays into both nostrils daily.  . Ipratropium-Albuterol (COMBIVENT RESPIMAT) 20-100 MCG/ACT AERS respimat Inhale 1 puff into the lungs every 6 (six) hours as needed for wheezing.  . isosorbide mononitrate (IMDUR) 30 MG 24 hr tablet Take 1-2 tablets (30-60 mg total) by mouth daily. Take 1 pill in AM and then 2 pills at bedtime.  Marland Kitchen LORazepam (ATIVAN) 0.5 MG tablet TAKE ONE AND A HALF TABLETS BY MOUTH AS NEEDED FOR INSOMNIA  . losartan (COZAAR) 100 MG tablet TAKE ONE TABLET EVERY DAY  . metoprolol  succinate (TOPROL-XL) 25 MG 24 hr tablet Take 1 tablet (25 mg total) by mouth daily. Take with or immediately following a meal.  . nitroGLYCERIN (NITROSTAT) 0.4 MG SL tablet PLACE 1 TABLET UNDER TONGUE EVERY 5 MIN AS NEEDED FOR CHEST PAIN IF NO RELIEF IN15 MIN CALL 911 (MAX 3 TABS)  . simvastatin (ZOCOR) 40 MG tablet TAKE ONE TABLET AT BEDTIME  No facility-administered encounter medications on file as of 03/25/2019.     Activities of Daily Living In your present state of health, do you have any difficulty performing the following activities: 03/25/2019  Hearing? Y  Comment Hearing aid  Vision? N  Difficulty concentrating or making decisions? N  Walking or climbing stairs? N  Dressing or bathing? N  Doing errands, shopping? N  Preparing Food and eating ? N  Comment Daughter assist with meal prep as needed.  Using the Toilet? N  In the past six months, have you accidently leaked urine? N  Do you have problems with loss of bowel control? N  Managing your Medications? N  Managing your Finances? N  Comment Daughter assist as needed  Housekeeping or managing your Housekeeping? Y  Comment Maid assist twice a week  Some recent data might be hidden    Patient Care Team: [name], MD as PCP - General (Internal Medicine) Rockey Situ Kathlene November, MD (Cardiology) Christene Lye, MD (General Surgery) Karlyn Agee, MD (Unknown Physician Specialty)    Assessment:   This is a routine wellness examination for [name].  I connected with patient 03/25/19 at  9:00 AM EDT by a video/audio enabled telemedicine application and verified that I am speaking with the correct person using two identifiers. Patient stated full name and DOB. Patient gave permission to continue with virtual visit. Patient's location was at home and Nurse's location was at [place] office.   Health Screenings  Mammogram - 02/2019 Colonoscopy - 12/2009 Bone Density - 06/2017 Glaucoma -none Hearing -hearing  aids Labs followed by pcp Dental- visit every 6 months Vision- visits within the last 12 months.  Social  Alcohol intake - not currently      Smoking history- former    Smokers in home? none Illicit drug use? none Exercise - walks her driveway daily 2-3 times, 15 min total Diet - regular, Boost Sexually Active -never BMI- discussed the importance of a healthy diet, water intake and the benefits of aerobic exercise.  Educational material provided.   Safety  Patient feels safe at home- yes Patient does have smoke detectors at home- yes Patient does wear sunscreen or protective clothing when in direct sunlight -yes Patient does wear seat belt when in a moving vehicle -yes Patient drives- yes Life alert- yes  Covid-19 precautions and sickness symptoms discussed.   Activities of Daily Living Patient denies needing assistance with: driving, feeding themselves, getting from bed to chair, getting to the toilet, bathing/showering or dressing.  Maid assist with household chores.  Daughter assist with meal prep and financial management as needed.   Depression Screen Patient denies losing interest in daily life, feeling hopeless, or crying easily over simple problems.   Medication-taking as directed and without issues.   Fall Screen Patient denies being afraid of falling or falling in the last year.   Memory Screen Patient is alert.  Patient denies difficulty focusing, concentrating or misplacing items. Correctly identified the president of the [place] and season. Patient likes to work cross word puzzles and play cards for brain stimulation.  Immunizations The following Immunizations were discussed: Influenza, shingles, pneumonia, and tetanus.   Other Providers Patient Care Team: [name], MD as PCP - General (Internal Medicine) Minna Merritts, MD (Cardiology) Christene Lye, MD (General Surgery) Karlyn Agee, MD (Unknown Physician Specialty)   Exercise Activities and Dietary recommendations Current Exercise Habits: Home exercise routine, Type of exercise: walking, Time (Minutes): 15, Frequency (Times/Week):  4, Weekly Exercise (Minutes/Week): 60, Intensity: Mild  Goals    . Follow up with Primary Care Provider     As needed       Fall Risk Fall Risk  03/25/2019 02/05/2019 03/14/2018 12/25/2017 11/08/2016  Falls in the past year? 0 0 No Yes No  Comment - - - - -  Number falls in past yr: - 0 - 1 -  Injury with Fall? - 0 - Yes -  Follow up - Falls evaluation completed - - -   Depression Screen PHQ 2/9 Scores 03/25/2019 02/05/2019 03/14/2018 11/21/2017  PHQ - 2 Score 0 0 0 0  PHQ- 9 Score - 0 - 1     Cognitive Function MMSE - Mini Mental State Exam 11/08/2016  Orientation to time 5  Orientation to Place 5  Registration 3  Attention/ Calculation 5  Recall 3  Language- name 2 objects 2  Language- repeat 1  Language- follow 3 step command 3  Language- read & follow direction 1  Write a sentence 1  Copy design 1  Total score 30     6CIT Screen 03/25/2019 03/14/2018  What Year? 0 points 0 points  What month? 0 points 0 points  What time? 0 points 0 points  Count back from 20 0 points 0 points  Months in reverse 0 points 0 points  Repeat phrase 0 points 0 points  Total Score 0 0    Immunization History  Administered Date(s) Administered  . Influenza Split 07/02/2014, 07/08/2015, 05/20/2016  . Influenza Whole 06/11/2012  . Influenza, High Dose Seasonal PF 05/30/2018  . Pneumococcal Conjugate-13 12/24/2013  . Pneumococcal Polysaccharide-23 09/16/2011, 11/09/2015  . Tdap 02/07/2013  . Zoster 07/26/2013  . Zoster Recombinat (Shingrix) 05/15/2017, 07/21/2017   Screening Tests Health Maintenance  Topic Date Due  . INFLUENZA VACCINE  04/20/2019  . TETANUS/TDAP  02/08/2023  . DEXA SCAN  Completed  . PNA vac Low Risk Adult  Completed      Plan:    End of life planning; Advance aging; Advanced directives discussed.  Copy  of current HCPOA/Living Will requested.    I have personally reviewed and noted the following in the patient's chart:   . Medical and social history . Use of alcohol, tobacco or illicit drugs  . Current medications and supplements . Functional ability and status . Nutritional status . Physical activity . Advanced directives . List of other physicians . Hospitalizations, surgeries, and ER visits in previous 12 months . Vitals . Screenings to include cognitive, depression, and falls . Referrals and appointments  In addition, I have reviewed and discussed with patient certain preventive protocols, quality metrics, and best practice recommendations. A written personalized care plan for preventive services as well as general preventive health recommendations were provided to patient.     OBrien-Blaney, Mahdiya Mossberg L, LPN  04/19/2750   I have reviewed the above information and agree with above.   Deborra Medina, MD

## 2019-03-25 NOTE — Progress Notes (Signed)
Telephone Note  This visit type was conducted due to national recommendations for restrictions regarding the COVID-19 pandemic (e.g. social distancing).  This format is felt to be most appropriate for this patient at this time.  All issues noted in this document were discussed and addressed.  No physical exam was performed (except for noted visual exam findings with Video Visits).   I connected with@ on 03/25/19 at  9:30 AM EDT by  telephone and verified that I am speaking with the correct person using two identifiers. Location patient: home Location provider: work or home office Persons participating in the virtual visit: patient, provider  I discussed the limitations, risks, security and privacy concerns of performing an evaluation and management service by telephone and the availability of in person appointments. I also discussed with the patient that there may be a patient responsible charge related to this service. The patient expressed understanding and agreed to proceed.  Reason for visit: follow up on GAD,  COPD, CAD   HPI:  85 yr old with history of CAD/PAD,  prior CABG, recently seen by cardiology for recurrent chest pain chest pain, recent ER evaluation for same,  Troponin negative,   equivocal myoview,  imdur was added and subsequently increased to 30 mg  In the am and 60 mg pm by Gollan Jun 30  .  she reports today that her chest pain has stopped occcurring.   She denies any nocturnal dizziness or unsteadiness,  But keeps her walker near the bed as she has been afraid of  Falling ever since her pelvic  fracture .  Walking up and down her driveway for exercise.  Taking losartan and metoprolol in the morning.  Has not been checking blood pressure  The patient has no signs or symptoms of COVID 19 infection (fever, cough, sore throat  or shortness of breath beyond what is typical for patient).  Patient denies contact with other persons with the above mentioned symptoms or with anyone  confirmed to have COVID 19   CKD:  She is avoiding use of NSAIDS,  Uses bioflex for her back pain   COPD:  She denies any recent episodes of cough or dyspnea . Using her MDIs as directed    Mild malnutrition:  Appetite has improved  Weight is stable.   ROS: See pertinent positives and negatives per HPI.  Past Medical History:  Diagnosis Date  . C. difficile colitis   . CAD (coronary artery disease)    a. Nuclear 10/11, not gated, no scar or ischemia, EF 68%, low risk study; b. [place] 06/18/14: no evidence of infarction or ischemia, no WMA, normalization of TWI in precordial leads, equivocal EKG changes EF 77%, low risk study  . Carotid artery disease ([place])    Doppler January, 2012, 40-59% bilateral, followup one year  . Closed fracture pubis ([place]) 07/04/2017  . COPD (chronic obstructive pulmonary disease) ([place])   . Diffuse cystic mastopathy   . Diverticulitis    last colonoscopy  incomplete Jan 2011  . Dizziness    stable now (Oct 2011) patient tells me question of TIA, we will obtain records  . Gait abnormality    Patient complains of "wobbly gait", December, 2013  . GERD (gastroesophageal reflux disease)   . History of migraines   . Hx of CABG    a. 3 vessel in 1999  . Hyperlipidemia   . Hypertension   . Indigestion    3 weeks, October 2011  . Kidney stones   .  Rheumatic fever   . Sinus bradycardia    Mild, December, 2013  . SOB (shortness of breath)   . Syncopal episodes    after 18 holes of golf and increase hydrochlorothiazide, resolved     Past Surgical History:  Procedure Laterality Date  . APPENDECTOMY  1950  . BREAST BIOPSY Right 1994  . BREAST CYST ASPIRATION     multiple BIL  . CATARACT EXTRACTION Right 2009  . CATARACT EXTRACTION Left 2011  . COLONOSCOPY  [id number]   Dr. Jamal Collin  . CORONARY ARTERY BYPASS GRAFT  1999  . esophagus stretched    . TONSILLECTOMY  1939  . TOTAL ABDOMINAL HYSTERECTOMY  1968   due to metrorrhagia    Family  History  Problem Relation Age of Onset  . Heart disease Mother   . Heart disease Father   . Cancer Neg Hx   . Drug abuse Neg Hx   . Breast cancer Neg Hx     SOCIAL HX:  reports that she quit smoking about 35 years ago. Her smoking use included cigarettes. She has a 20.00 pack-year smoking history. She has never used smokeless tobacco. She reports previous alcohol use. She reports that she does not use drugs.   Current Outpatient Medications:  .  amLODipine (NORVASC) 2.5 MG tablet, TAKE 1 TABLET BY MOUTH DAILY, Disp: 90 tablet, Rfl: 1 .  clopidogrel (PLAVIX) 75 MG tablet, TAKE ONE TABLET EVERY DAY, Disp: 90 tablet, Rfl: 2 .  esomeprazole (NEXIUM) 40 MG capsule, TAKE 1 CAPSULE DAILY BEFORE BREAKFAST, Disp: 90 capsule, Rfl: 1 .  fluticasone (FLONASE) 50 MCG/ACT nasal spray, Place 2 sprays into both nostrils daily., Disp: 16 g, Rfl: 2 .  Ipratropium-Albuterol (COMBIVENT RESPIMAT) 20-100 MCG/ACT AERS respimat, Inhale 1 puff into the lungs every 6 (six) hours as needed for wheezing., Disp: 4 g, Rfl: 5 .  isosorbide mononitrate (IMDUR) 30 MG 24 hr tablet, Take 1-2 tablets (30-60 mg total) by mouth daily. Take 1 pill in AM and then 2 pills at bedtime., Disp: 270 tablet, Rfl: 3 .  LORazepam (ATIVAN) 0.5 MG tablet, TAKE ONE AND A HALF TABLETS BY MOUTH AS NEEDED FOR INSOMNIA, Disp: 45 tablet, Rfl: 0 .  losartan (COZAAR) 100 MG tablet, TAKE ONE TABLET EVERY DAY, Disp: 90 tablet, Rfl: 2 .  metoprolol succinate (TOPROL-XL) 25 MG 24 hr tablet, Take 1 tablet (25 mg total) by mouth daily. Take with or immediately following a meal., Disp: 90 tablet, Rfl: 3 .  nitroGLYCERIN (NITROSTAT) 0.4 MG SL tablet, PLACE 1 TABLET UNDER TONGUE EVERY 5 MIN AS NEEDED FOR CHEST PAIN IF NO RELIEF IN15 MIN CALL 911 (MAX 3 TABS), Disp: 25 tablet, Rfl: 1 .  simvastatin (ZOCOR) 40 MG tablet, TAKE ONE TABLET AT BEDTIME, Disp: 90 tablet, Rfl: 1  EXAM:  VITALS per patient if applicable:  GENERAL: alert, oriented, appears well and  in no acute distress  HEENT: atraumatic, conjunttiva clear, no obvious abnormalities on inspection of external nose and ears  NECK: normal movements of the head and neck  LUNGS: on inspection no signs of respiratory distress, breathing rate appears normal, no obvious gross SOB, gasping or wheezing  CV: no obvious cyanosis  MS: moves all visible extremities without noticeable abnormality  PSYCH/NEURO: pleasant and cooperative, no obvious depression or anxiety, speech and thought processing grossly intact  ASSESSMENT AND PLAN:   Mild malnutrition ([place])  I have reviewed her diet and recommended that she increase her protein and fat intake while monitoring her  carbohydrates.   COPD, moderate ([place]) Symptoms are stable given her lack of exercise and outdoor activities  Hyperlipidemia LDL and triglycerides have been at goal on current medications. shehas no side effects and liver enzymes are normal. No changes today   Hypertension Well controlled on current regimen. Renal function stable, no changes today.      I discussed the assessment and treatment plan with the patient. The patient was provided an opportunity to ask questions and all were answered. The patient agreed with the plan and demonstrated an understanding of the instructions.   The patient was advised to call back or seek an in-person evaluation if the symptoms worsen or if the condition fails to improve as anticipated.  I provided  25 minutes of non-face-to-face time during this encounter.   [name], MD

## 2019-03-25 NOTE — Patient Instructions (Addendum)
  [name] , Thank you for taking time to come for your Medicare Wellness Visit. I appreciate your ongoing commitment to your health goals. Please review the following plan we discussed and let me know if I can assist you in the future.   These are the goals we discussed: Goals    . Follow up with Primary Care Provider     As needed       This is a list of the screening recommended for you and due dates:  Health Maintenance  Topic Date Due  . Flu Shot  04/20/2019  . Tetanus Vaccine  02/08/2023  . DEXA scan (bone density measurement)  Completed  . Pneumonia vaccines  Completed

## 2019-03-26 NOTE — Assessment & Plan Note (Signed)
LDL and triglycerides have been at goal on current medications. shehas no side effects and liver enzymes are normal. No changes today

## 2019-03-26 NOTE — Assessment & Plan Note (Signed)
Well controlled on current regimen. Renal function stable, no changes today. 

## 2019-03-26 NOTE — Assessment & Plan Note (Signed)
Symptoms are stable given her lack of exercise and outdoor activities

## 2019-03-26 NOTE — Assessment & Plan Note (Signed)
I have reviewed her diet and recommended that she increase her protein and fat intake while monitoring her carbohydrates.  

## 2019-04-02 ENCOUNTER — Other Ambulatory Visit: Payer: Self-pay

## 2019-04-02 ENCOUNTER — Other Ambulatory Visit (INDEPENDENT_AMBULATORY_CARE_PROVIDER_SITE_OTHER): Payer: Medicare Other

## 2019-04-02 DIAGNOSIS — E441 Mild protein-calorie malnutrition: Secondary | ICD-10-CM | POA: Diagnosis not present

## 2019-04-02 DIAGNOSIS — E782 Mixed hyperlipidemia: Secondary | ICD-10-CM | POA: Diagnosis not present

## 2019-04-02 LAB — COMPREHENSIVE METABOLIC PANEL
ALT: 8 U/L (ref 0–35)
AST: 11 U/L (ref 0–37)
Albumin: 4 g/dL (ref 3.5–5.2)
Alkaline Phosphatase: 53 U/L (ref 39–117)
BUN: 19 mg/dL (ref 6–23)
CO2: 28 mEq/L (ref 19–32)
Calcium: 8.5 mg/dL (ref 8.4–10.5)
Chloride: 106 mEq/L (ref 96–112)
Creatinine, Ser: 1.22 mg/dL — ABNORMAL HIGH (ref 0.40–1.20)
GFR: 41.35 mL/min — ABNORMAL LOW (ref >60.00)
Glucose, Bld: 101 mg/dL — ABNORMAL HIGH (ref 70–99)
Potassium: 4.1 mEq/L (ref 3.5–5.1)
Sodium: 142 mEq/L (ref 135–145)
Total Bilirubin: 0.5 mg/dL (ref 0.2–1.2)
Total Protein: 5.8 g/dL — ABNORMAL LOW (ref 6.0–8.3)

## 2019-04-02 LAB — LIPID PANEL
Cholesterol: 120 mg/dL (ref 0–200)
HDL: 35.9 mg/dL — ABNORMAL LOW (ref >39.00)
LDL Cholesterol: 57 mg/dL (ref 0–99)
NonHDL: 84
Total CHOL/HDL Ratio: 3
Triglycerides: 135 mg/dL (ref 0.0–149.0)
VLDL: 27 mg/dL (ref 0.0–40.0)

## 2019-04-03 LAB — PREALBUMIN: Prealbumin: 27 mg/dL (ref 17–34)

## 2019-04-04 ENCOUNTER — Other Ambulatory Visit: Payer: Self-pay | Admitting: Internal Medicine

## 2019-04-20 ENCOUNTER — Other Ambulatory Visit: Payer: Self-pay | Admitting: Internal Medicine

## 2019-04-22 NOTE — Telephone Encounter (Signed)
Refilled: 03/05/2019 Last OV: 03/25/2019 Next OV: not scheduled

## 2019-04-29 ENCOUNTER — Telehealth: Payer: Self-pay | Admitting: Cardiovascular Disease

## 2019-04-29 NOTE — Telephone Encounter (Signed)
Patient asks if there is a medication for her exertion. Please call to discuss.

## 2019-04-30 DIAGNOSIS — L570 Actinic keratosis: Secondary | ICD-10-CM | POA: Diagnosis not present

## 2019-04-30 DIAGNOSIS — D485 Neoplasm of uncertain behavior of skin: Secondary | ICD-10-CM | POA: Diagnosis not present

## 2019-04-30 DIAGNOSIS — D0472 Carcinoma in situ of skin of left lower limb, including hip: Secondary | ICD-10-CM | POA: Diagnosis not present

## 2019-04-30 DIAGNOSIS — L821 Other seborrheic keratosis: Secondary | ICD-10-CM | POA: Diagnosis not present

## 2019-04-30 DIAGNOSIS — X32XXXA Exposure to sunlight, initial encounter: Secondary | ICD-10-CM | POA: Diagnosis not present

## 2019-05-01 NOTE — Telephone Encounter (Signed)
Returned call to patient. After chart review, it looks like she has ongoing angina. Last seen in office with Dr. Rockey Situ 02/2019 after being seen in ED. Plan developed with Imdur 30 mg am, 60 mg pm. Pt refusing cath.   Dr. Rockey Situ suggested trying Ranexa if Imdur doesn't alleviate sx.   Pt denies any pain (0/10) but reports "chest feels full" with exertion. "if I walk to the mailbox and back I am completely wiped out". Pt denies using NTG and wants to reserve it only in situations when she feels pain. She does endorse mild SOB with exertion.   Vitals: 8/4 145/71, HR 53 8/5 146/69, 55 8/6 149/74, 61 8/7 130/64, 60 8/8 131/68, 60 8/9 158/73, 62 8/10 137/70, 60 8/11 149/69, 56  Routing to DOD to further advise.

## 2019-05-01 NOTE — Telephone Encounter (Signed)
Routing to Dr Rockey Situ for further advise.

## 2019-05-02 NOTE — Telephone Encounter (Signed)
BP is elevated and probably goes up higher with exertion. That can cause SOB Can we increase imdur to 60 BID Would make sure pressures are late morning, 2 hours after BP meds thx TG

## 2019-05-03 MED ORDER — ISOSORBIDE MONONITRATE ER 60 MG PO TB24: 60.0000 mg | ORAL_TABLET | Freq: Two times a day (BID) | ORAL | 1 refills | Status: DC

## 2019-05-03 NOTE — Telephone Encounter (Signed)
Patient made aware of Dr.Gollan's recommendation. She will increase Imdur to 60mg  bid. Adv the patient to continue to monitor her BP daily 2 hours after she has taken her BP medications. Patient verbalized understanding and voiced appreciation for the call.

## 2019-05-13 ENCOUNTER — Other Ambulatory Visit: Payer: Self-pay | Admitting: Cardiovascular Disease

## 2019-05-15 ENCOUNTER — Ambulatory Visit: Payer: Self-pay

## 2019-05-15 ENCOUNTER — Other Ambulatory Visit: Payer: Self-pay

## 2019-05-15 ENCOUNTER — Ambulatory Visit (INDEPENDENT_AMBULATORY_CARE_PROVIDER_SITE_OTHER): Payer: Medicare Other

## 2019-05-15 DIAGNOSIS — Z23 Encounter for immunization: Secondary | ICD-10-CM

## 2019-06-11 ENCOUNTER — Other Ambulatory Visit: Payer: Self-pay

## 2019-06-12 DIAGNOSIS — C44729 Squamous cell carcinoma of skin of left lower limb, including hip: Secondary | ICD-10-CM | POA: Diagnosis not present

## 2019-06-17 ENCOUNTER — Other Ambulatory Visit: Payer: Self-pay

## 2019-06-17 NOTE — Patient Outreach (Signed)
[facility]) Care Management  06/17/2019  [name] [birth year] [id number]    Telephone Screen Referral Date : 06/11/19 Referral Source: EMMI Prevent Referral Reason: Possible Resources Insurance: Medicare    Outreach attempt # 1 to patient for introduction.  Patient able to verify HIPAA.  Explained to the patient health coach role and [facility] services.  Patient is open to the call and verbally agreed to services.     Social:  The patient lives in the home alone.  Her daughter [name] is active in her care.  She states that she is independent with her daily care needs.  She denies and pain or falls.  She states that she has transportation to her appointment.  The equipment in the home consist of:  Blood pressure cuff, walker, scales and eyeglasses.  Conditions: The patient states that she has the following conditions :  COPD and CHF.  She states she does not weight daily. She does not totally understand the CHF. She does know that she is taking medications for her heart.  She expressed that she would like to have someone to call her and give her information about it.  She sees Dr Rockey Situ for her heart.  Medications: Per the patient she is on eight medication and she takes them as prescribed. She states she does not have any questions or concerns regarding her medications and she is able to pay for them. Appointments:   Plan: [place] will make a referral for Health Coach services no other discipline needed at this time.   Lazaro Arms RN, BSN, [place] Direct Dial:  [phone] Fax: [phone]

## 2019-06-18 ENCOUNTER — Emergency Department: Payer: Medicare Other

## 2019-06-18 ENCOUNTER — Observation Stay
Admission: EM | Admit: 2019-06-18 | Discharge: 2019-06-18 | Disposition: A | Discharge status: Home / Self Care | Payer: Medicare Other | Attending: Internal Medicine | Admitting: Internal Medicine

## 2019-06-18 ENCOUNTER — Other Ambulatory Visit: Payer: Self-pay

## 2019-06-18 DIAGNOSIS — I130 Hypertensive heart and chronic kidney disease with heart failure and stage 1 through stage 4 chronic kidney disease, or unspecified chronic kidney disease: Secondary | ICD-10-CM | POA: Diagnosis not present

## 2019-06-18 DIAGNOSIS — Z79899 Other long term (current) drug therapy: Secondary | ICD-10-CM | POA: Insufficient documentation

## 2019-06-18 DIAGNOSIS — F419 Anxiety disorder, unspecified: Secondary | ICD-10-CM | POA: Diagnosis not present

## 2019-06-18 DIAGNOSIS — J449 Chronic obstructive pulmonary disease, unspecified: Secondary | ICD-10-CM | POA: Diagnosis not present

## 2019-06-18 DIAGNOSIS — E559 Vitamin D deficiency, unspecified: Secondary | ICD-10-CM | POA: Diagnosis not present

## 2019-06-18 DIAGNOSIS — Z7951 Long term (current) use of inhaled steroids: Secondary | ICD-10-CM | POA: Diagnosis not present

## 2019-06-18 DIAGNOSIS — I700 Atherosclerosis of aorta: Secondary | ICD-10-CM | POA: Insufficient documentation

## 2019-06-18 DIAGNOSIS — Z8249 Family history of ischemic heart disease and other diseases of the circulatory system: Secondary | ICD-10-CM | POA: Insufficient documentation

## 2019-06-18 DIAGNOSIS — I200 Unstable angina: Secondary | ICD-10-CM

## 2019-06-18 DIAGNOSIS — I25110 Atherosclerotic heart disease of native coronary artery with unstable angina pectoris: Secondary | ICD-10-CM | POA: Diagnosis not present

## 2019-06-18 DIAGNOSIS — I2510 Atherosclerotic heart disease of native coronary artery without angina pectoris: Secondary | ICD-10-CM | POA: Diagnosis not present

## 2019-06-18 DIAGNOSIS — N179 Acute kidney failure, unspecified: Secondary | ICD-10-CM | POA: Diagnosis not present

## 2019-06-18 DIAGNOSIS — I252 Old myocardial infarction: Secondary | ICD-10-CM | POA: Insufficient documentation

## 2019-06-18 DIAGNOSIS — Z87442 Personal history of urinary calculi: Secondary | ICD-10-CM | POA: Diagnosis not present

## 2019-06-18 DIAGNOSIS — K449 Diaphragmatic hernia without obstruction or gangrene: Secondary | ICD-10-CM | POA: Insufficient documentation

## 2019-06-18 DIAGNOSIS — E785 Hyperlipidemia, unspecified: Secondary | ICD-10-CM | POA: Insufficient documentation

## 2019-06-18 DIAGNOSIS — N183 Chronic kidney disease, stage 3 (moderate): Secondary | ICD-10-CM | POA: Insufficient documentation

## 2019-06-18 DIAGNOSIS — M810 Age-related osteoporosis without current pathological fracture: Secondary | ICD-10-CM | POA: Diagnosis not present

## 2019-06-18 DIAGNOSIS — Z7902 Long term (current) use of antithrombotics/antiplatelets: Secondary | ICD-10-CM | POA: Insufficient documentation

## 2019-06-18 DIAGNOSIS — Z951 Presence of aortocoronary bypass graft: Secondary | ICD-10-CM | POA: Diagnosis not present

## 2019-06-18 DIAGNOSIS — Z20828 Contact with and (suspected) exposure to other viral communicable diseases: Secondary | ICD-10-CM | POA: Diagnosis not present

## 2019-06-18 DIAGNOSIS — Z87891 Personal history of nicotine dependence: Secondary | ICD-10-CM | POA: Insufficient documentation

## 2019-06-18 DIAGNOSIS — R079 Chest pain, unspecified: Secondary | ICD-10-CM | POA: Diagnosis not present

## 2019-06-18 DIAGNOSIS — R0789 Other chest pain: Secondary | ICD-10-CM | POA: Diagnosis not present

## 2019-06-18 DIAGNOSIS — K219 Gastro-esophageal reflux disease without esophagitis: Secondary | ICD-10-CM | POA: Diagnosis not present

## 2019-06-18 LAB — COMPREHENSIVE METABOLIC PANEL
ALT: 13 U/L (ref 0–44)
AST: 17 U/L (ref 15–41)
Albumin: 3.8 g/dL (ref 3.5–5.0)
Alkaline Phosphatase: 58 U/L (ref 38–126)
Anion gap: 7 (ref 5–15)
BUN: 22 mg/dL (ref 8–23)
CO2: 27 mmol/L (ref 22–32)
Calcium: 9 mg/dL (ref 8.9–10.3)
Chloride: 107 mmol/L (ref 98–111)
Creatinine, Ser: 1.31 mg/dL — ABNORMAL HIGH (ref 0.44–1.00)
GFR calc Af Amer: 41 mL/min — ABNORMAL LOW (ref >60)
GFR calc non Af Amer: 36 mL/min — ABNORMAL LOW (ref >60)
Glucose, Bld: 119 mg/dL — ABNORMAL HIGH (ref 70–99)
Potassium: 3.6 mmol/L (ref 3.5–5.1)
Sodium: 141 mmol/L (ref 135–145)
Total Bilirubin: 0.6 mg/dL (ref 0.3–1.2)
Total Protein: 6 g/dL — ABNORMAL LOW (ref 6.5–8.1)

## 2019-06-18 LAB — BASIC METABOLIC PANEL
Anion gap: 8 (ref 5–15)
BUN: 20 mg/dL (ref 8–23)
CO2: 25 mmol/L (ref 22–32)
Calcium: 8.6 mg/dL — ABNORMAL LOW (ref 8.9–10.3)
Chloride: 108 mmol/L (ref 98–111)
Creatinine, Ser: 1.22 mg/dL — ABNORMAL HIGH (ref 0.44–1.00)
GFR calc Af Amer: 45 mL/min — ABNORMAL LOW (ref >60)
GFR calc non Af Amer: 39 mL/min — ABNORMAL LOW (ref >60)
Glucose, Bld: 100 mg/dL — ABNORMAL HIGH (ref 70–99)
Potassium: 4.4 mmol/L (ref 3.5–5.1)
Sodium: 141 mmol/L (ref 135–145)

## 2019-06-18 LAB — CBC WITH DIFFERENTIAL/PLATELET
Abs Immature Granulocytes: 0.01 10*3/uL (ref 0.00–0.07)
Basophils Absolute: 0.1 10*3/uL (ref 0.0–0.1)
Basophils Relative: 1 %
Eosinophils Absolute: 0.2 10*3/uL (ref 0.0–0.5)
Eosinophils Relative: 4 %
HCT: 37.7 % (ref 36.0–46.0)
Hemoglobin: 12.5 g/dL (ref 12.0–15.0)
Immature Granulocytes: 0 %
Lymphocytes Relative: 33 %
Lymphs Abs: 2 10*3/uL (ref 0.7–4.0)
MCH: 31.9 pg (ref 26.0–34.0)
MCHC: 33.2 g/dL (ref 30.0–36.0)
MCV: 96.2 fL (ref 80.0–100.0)
Monocytes Absolute: 0.7 10*3/uL (ref 0.1–1.0)
Monocytes Relative: 12 %
Neutro Abs: 3.1 10*3/uL (ref 1.7–7.7)
Neutrophils Relative %: 50 %
Platelets: 154 10*3/uL (ref 150–400)
RBC: 3.92 MIL/uL (ref 3.87–5.11)
RDW: 12.1 % (ref 11.5–15.5)
WBC: 6.1 10*3/uL (ref 4.0–10.5)
nRBC: 0 % (ref 0.0–0.2)

## 2019-06-18 LAB — TROPONIN I (HIGH SENSITIVITY)
Troponin I (High Sensitivity): 7 ng/L (ref <18)
Troponin I (High Sensitivity): 14 ng/L (ref <18)
Troponin I (High Sensitivity): 18 ng/L — ABNORMAL HIGH (ref <18)
Troponin I (High Sensitivity): 16 ng/L (ref <18)

## 2019-06-18 LAB — SARS CORONAVIRUS 2 BY RT PCR (HOSPITAL ORDER, PERFORMED IN ~~LOC~~ HOSPITAL LAB): SARS Coronavirus 2: NEGATIVE

## 2019-06-18 LAB — APTT: aPTT: 25 seconds (ref 24–36)

## 2019-06-18 MED ORDER — NITROGLYCERIN 0.4 MG SL SUBL: 0.4000 mg | SUBLINGUAL_TABLET | SUBLINGUAL | Status: DC | PRN

## 2019-06-18 MED ORDER — ONDANSETRON HCL 4 MG/2ML IJ SOLN
4.0000 mg | Freq: Once | INTRAMUSCULAR | Status: AC
  Administered 2019-06-18: 4 mg via INTRAVENOUS
  Filled 2019-06-18: qty 2

## 2019-06-18 MED ORDER — ACETAMINOPHEN 325 MG PO TABS: 650.0000 mg | ORAL_TABLET | ORAL | Status: DC | PRN

## 2019-06-18 MED ORDER — SIMVASTATIN 20 MG PO TABS: 40.0000 mg | ORAL_TABLET | Freq: Every day | ORAL | Status: DC

## 2019-06-18 MED ORDER — IPRATROPIUM-ALBUTEROL 20-100 MCG/ACT IN AERS
1.0000 | INHALATION_SPRAY | Freq: Four times a day (QID) | RESPIRATORY_TRACT | Status: DC | PRN
  Filled 2019-06-18: qty 4

## 2019-06-18 MED ORDER — ENOXAPARIN SODIUM 40 MG/0.4ML ~~LOC~~ SOLN: 40.0000 mg | SUBCUTANEOUS | Status: DC

## 2019-06-18 MED ORDER — PANTOPRAZOLE SODIUM 40 MG PO TBEC
40.0000 mg | DELAYED_RELEASE_TABLET | Freq: Every day | ORAL | Status: DC
  Administered 2019-06-18: 40 mg via ORAL
  Filled 2019-06-18: qty 1

## 2019-06-18 MED ORDER — ONDANSETRON HCL 4 MG/2ML IJ SOLN: 4.0000 mg | Freq: Four times a day (QID) | INTRAMUSCULAR | Status: DC | PRN

## 2019-06-18 MED ORDER — MORPHINE SULFATE (PF) 2 MG/ML IV SOLN
2.0000 mg | Freq: Once | INTRAVENOUS | Status: AC
  Administered 2019-06-18: 2 mg via INTRAVENOUS
  Filled 2019-06-18: qty 1

## 2019-06-18 MED ORDER — CLOPIDOGREL BISULFATE 75 MG PO TABS
75.0000 mg | ORAL_TABLET | Freq: Every day | ORAL | Status: DC
  Administered 2019-06-18: 75 mg via ORAL
  Filled 2019-06-18: qty 1

## 2019-06-18 MED ORDER — AMLODIPINE BESYLATE 5 MG PO TABS
2.5000 mg | ORAL_TABLET | Freq: Every day | ORAL | Status: DC
  Administered 2019-06-18: 2.5 mg via ORAL
  Filled 2019-06-18: qty 1

## 2019-06-18 MED ORDER — ISOSORBIDE MONONITRATE ER 60 MG PO TB24
60.0000 mg | ORAL_TABLET | Freq: Two times a day (BID) | ORAL | Status: DC
  Administered 2019-06-18: 60 mg via ORAL
  Filled 2019-06-18: qty 1

## 2019-06-18 MED ORDER — FLUTICASONE PROPIONATE 50 MCG/ACT NA SUSP
2.0000 | Freq: Every day | NASAL | Status: DC
  Administered 2019-06-18: 2 via NASAL
  Filled 2019-06-18: qty 16

## 2019-06-18 MED ORDER — LORAZEPAM 0.5 MG PO TABS: 0.5000 mg | ORAL_TABLET | Freq: Every day | ORAL | Status: DC

## 2019-06-18 MED ORDER — ASPIRIN EC 81 MG PO TBEC: 81.0000 mg | DELAYED_RELEASE_TABLET | Freq: Every day | ORAL | Status: DC

## 2019-06-18 NOTE — ED Notes (Signed)
Patient's daughter is at bedside. 

## 2019-06-18 NOTE — H&P (Addendum)
[place] [place] NAME: [name]    MR#:  [id number]  DATE OF BIRTH:  06-Sep-1926  DATE OF ADMISSION:  06/18/2019  PRIMARY CARE PHYSICIAN: [name], MD   REQUESTING/REFERRING PHYSICIAN: Lurline Hare, MD  CHIEF COMPLAINT:   Chief Complaint  Patient presents with  . Chest Pain    HISTORY OF PRESENT ILLNESS:  85 y.o. female with pertinent past medical history of CAD, hyperlipidemia, anxiety, syncope, hypertension, COPD, GERD, MI, and CKD stage III presenting to the ED with chief complaints of chest pain.  Patient stated that she woke up with sudden onset of chest pain wound 2300.  Patient describes associated symptoms of shortness of breath and nausea without vomiting. Denies diaphoresis, radiating pain, dizziness, abdominal pain, diarrhea or any other cardiac symptoms.  She took 4 baby aspirin and 3 tabs of sublingual nitroglycerin with no relief she therefore called EMS.  On arrival to the ED, She was afebrile with blood pressure 169/62 mm Hg and pulse rate 57 beats/min. There were no focal neurological deficits; She was alert and oriented x4, and she did not demonstrate any memory deficits.  Initial labs revealed unremarkable CBC, BUN and creatinine slightly elevated from baseline, initial troponin 7 repeat 14.  Chest x-ray showed no acute cardiopulmonary process.  PAST MEDICAL HISTORY:   Past Medical History:  Diagnosis Date  . C. difficile colitis   . CAD (coronary artery disease)    a. Nuclear 10/11, not gated, no scar or ischemia, EF 68%, low risk study; b. [place] 06/18/14: no evidence of infarction or ischemia, no WMA, normalization of TWI in precordial leads, equivocal EKG changes EF 77%, low risk study  . Carotid artery disease ([place])    Doppler January, 2012, 40-59% bilateral, followup one year  . Closed fracture pubis ([place]) 07/04/2017  . COPD (chronic obstructive pulmonary disease) ([place])   . Diffuse  cystic mastopathy   . Diverticulitis    last colonoscopy  incomplete Jan 2011  . Dizziness    stable now (Oct 2011) patient tells me question of TIA, we will obtain records  . Gait abnormality    Patient complains of "wobbly gait", December, 2013  . GERD (gastroesophageal reflux disease)   . History of migraines   . Hx of CABG    a. 3 vessel in 1999  . Hyperlipidemia   . Hypertension   . Indigestion    3 weeks, October 2011  . Kidney stones   . Rheumatic fever   . Sinus bradycardia    Mild, December, 2013  . SOB (shortness of breath)   . Syncopal episodes    after 18 holes of golf and increase hydrochlorothiazide, resolved     PAST SURGICAL HISTORY:   Past Surgical History:  Procedure Laterality Date  . APPENDECTOMY  1950  . BREAST BIOPSY Right 1994  . BREAST CYST ASPIRATION     multiple BIL  . CATARACT EXTRACTION Right 2009  . CATARACT EXTRACTION Left 2011  . COLONOSCOPY  [id number]   Dr. Jamal Collin  . CORONARY ARTERY BYPASS GRAFT  1999  . esophagus stretched    . TONSILLECTOMY  1939  . TOTAL ABDOMINAL HYSTERECTOMY  1968   due to metrorrhagia    SOCIAL HISTORY:   Social History   Tobacco Use  . Smoking status: Former Smoker    Packs/day: 0.50    Years: 40.00    Pack years: 20.00    Types: Cigarettes  Quit date: 09/20/1983    Years since quitting: 35.7  . Smokeless tobacco: Never Used  Substance Use Topics  . Alcohol use: Not Currently    Comment: yes, social wine    FAMILY HISTORY:   Family History  Problem Relation Age of Onset  . Heart disease Mother   . Heart disease Father   . Cancer Neg Hx   . Drug abuse Neg Hx   . Breast cancer Neg Hx     DRUG ALLERGIES:   Allergies  Allergen Reactions  . Codeine Nausea And Vomiting    Pt would like this removed. Not allergic to codeine.     REVIEW OF SYSTEMS:   Review of Systems  Constitutional: Negative for chills, fever, malaise/fatigue and weight loss.  HENT: Negative for congestion, hearing  loss and sore throat.   Eyes: Negative for blurred vision and double vision.  Respiratory: Positive for shortness of breath. Negative for cough and wheezing.   Cardiovascular: Positive for chest pain. Negative for palpitations, orthopnea and leg swelling.  Gastrointestinal: Positive for nausea. Negative for abdominal pain, diarrhea and vomiting.  Genitourinary: Negative for dysuria and urgency.  Musculoskeletal: Negative for myalgias.  Skin: Negative for rash.  Neurological: Negative for dizziness, sensory change, speech change, focal weakness and headaches.  Psychiatric/Behavioral: Negative for depression.    MEDICATIONS AT HOME:   Prior to Admission medications   Medication Sig Start Date End Date Taking? Authorizing Provider  amLODipine (NORVASC) 2.5 MG tablet TAKE 1 TABLET BY MOUTH DAILY 03/18/19  Yes [name], MD  clopidogrel (PLAVIX) 75 MG tablet TAKE ONE TABLET EVERY DAY 04/04/19  Yes [name], MD  esomeprazole (NEXIUM) 40 MG capsule TAKE 1 CAPSULE DAILY BEFORE BREAKFAST 05/09/13  Yes [name], MD  fluticasone (FLONASE) 50 MCG/ACT nasal spray Place 2 sprays into both nostrils daily. 03/21/18  Yes Laverle Hobby, MD  Ipratropium-Albuterol (COMBIVENT RESPIMAT) 20-100 MCG/ACT AERS respimat Inhale 1 puff into the lungs every 6 (six) hours as needed for wheezing. 07/23/18  Yes Wilhelmina Mcardle, MD  isosorbide mononitrate (IMDUR) 60 MG 24 hr tablet Take 1 tablet (60 mg total) by mouth 2 (two) times daily. . 05/03/19 08/01/19 Yes Gollan, Kathlene November, MD  LORazepam (ATIVAN) 0.5 MG tablet TAKE 1.5 TABLETS BY MOUTH AS NEEDED FOR INSOMNIA 04/23/19  Yes [name], MD  losartan (COZAAR) 100 MG tablet TAKE ONE TABLET EVERY DAY 04/04/19  Yes [name], MD  metoprolol succinate (TOPROL-XL) 25 MG 24 hr tablet Take 1 tablet (25 mg total) by mouth daily. Take with or immediately following a meal. 08/06/18  Yes Gollan, Kathlene November, MD  nitroGLYCERIN (NITROSTAT) 0.4 MG SL tablet  PLACE 1 TABLET UNDER TONGUE EVERY 5 MIN AS NEEDED FOR CHEST PAIN IF NO RELIEF IN15 MIN CALL 911 (MAX 3 TABS) 03/05/19  Yes Gollan, Kathlene November, MD  simvastatin (ZOCOR) 40 MG tablet TAKE 1 TABLET BY MOUTH AT BEDTIME 05/13/19  Yes Gollan, Kathlene November, MD      VITAL SIGNS:  Blood pressure (!) 172/67, pulse 64, temperature 97.6 F (36.4 C), temperature source Oral, resp. rate 18, Allen 5\' 3"  (1.6 m), weight 59 kg, SpO2 97 %.  PHYSICAL EXAMINATION:   Physical Exam  GENERAL:  85 y.o.-year-old patient lying in the bed with no acute distress.  EYES: Pupils equal, round, reactive to light and accommodation. No scleral icterus. Extraocular muscles intact.  HEENT: Head atraumatic, normocephalic. Oropharynx and nasopharynx clear.  NECK:  Supple, no jugular  venous distention. No thyroid enlargement, no tenderness.  LUNGS: Normal breath sounds bilaterally, no wheezing, rales,rhonchi or crepitation. No use of accessory muscles of respiration.  CARDIOVASCULAR: S1, S2 normal. No murmurs, rubs, or gallops.  ABDOMEN: Soft, nontender, nondistended. Bowel sounds present. No organomegaly or mass.  EXTREMITIES: No pedal edema, cyanosis, or clubbing.  NEUROLOGIC: Cranial nerves II through XII are intact. Muscle strength 5/5 in all extremities. Sensation intact. Gait not checked.  PSYCHIATRIC: The patient is alert and oriented x 3.  SKIN: No obvious rash, lesion, or ulcer.   DATA REVIEWED:  LABORATORY PANEL:   CBC Recent Labs  Lab 06/18/19 0126  WBC 6.1  HGB 12.5  HCT 37.7  PLT 154   ------------------------------------------------------------------------------------------------------------------  Chemistries  Recent Labs  Lab 06/18/19 0126  NA 141  K 3.6  CL 107  CO2 27  GLUCOSE 119*  BUN 22  CREATININE 1.31*  CALCIUM 9.0  AST 17  ALT 13  ALKPHOS 58  BILITOT 0.6   ------------------------------------------------------------------------------------------------------------------  Cardiac  Enzymes No results for input(s): TROPONINI in the last 168 hours. ------------------------------------------------------------------------------------------------------------------  RADIOLOGY:  Dg Chest Port 1 View  Result Date: 06/18/2019 CLINICAL DATA:  Chest pain woke patient from sleep EXAM: PORTABLE CHEST 1 VIEW COMPARISON:  Most recent comparison radiograph 03/30/2018, cardiac SPECT 03/07/2019 FINDINGS: Postsurgical changes related to prior CABG including intact and aligned sternotomy wires and multiple surgical clips projecting over the mediastinum. The aorta is calcified. The remaining cardiomediastinal contours are unremarkable. Moderate hiatal hernia, stable from prior. Lung hyperinflation with flattening of the diaphragms and relative apically lucency likely related to COPD. No consolidation, features of edema, pneumothorax, or effusion. Pulmonary vascularity is normally distributed. No acute osseous or soft tissue abnormality. Degenerative changes are present in the imaged spine and shoulders. IMPRESSION: 1. Postsurgical changes related to prior CABG. No active cardiopulmonary disease. 2. Unchanged moderate hiatal hernia. 3. No acute cardiopulmonary abnormality. 4. Hyperinflation and features compatible with COPD. Electronically Signed   By: Lovena Le M.D.   On: 06/18/2019 01:36    EKG:  EKG:   Date: 06/18/2019  EKG Time: 0116  Rate: 52  Rhythm: normal EKG, normal sinus rhythm  Axis: Normal  Intervals:none  ST&T Change: Nonspecific  IMPRESSION AND PLAN:   85 y.o. female  with pertinent past medical history of CAD, hyperlipidemia, anxiety, syncope, hypertension, COPD, GERD, MI, and CKD stage III presenting to the ED with chief complaints of chest pain.  1. Chest pain, unstable angina -no dynamic EKG changes or elevated troponin - Chest pain now improved with morphine in the ED. - Admit to telemetry unit for observation - Trend troponin - ASA 81mg  PO daily (initial  162-324mg  = 2-4x 81mg  chewable tablets) - Atorvastatin 80mg  PO daily, check lipid panel  - Hold metoprolol due to bradycardia in the low 40s - NTG + morphine PRN chest pain - We will hold off anti-coagulation as patient is pain-free with normal troponin - If no further chest pain or elevated troponin consider cardiology follow-up unless outpatient basis  2. Acute on chronic CKD - Creatinine slightly elevated above baseline - Hold nephrotoxins - Continue to monitor renal function  3. Coronary Artery Disease + Hx of MI + s/p CABG  - ASA 81mg  PO daily - Clopidogrel 75mg  PO daily   4. HLD  + Goal LDL<100 - Simvastatin 40mg  PO qhs  5. HTN  + Goal BP <130/80 - Continue metoprolol and amlodipine - Hold losartan in the setting of AKI  6. COPD -  no evidence of exacerbation - Continue home inhalers  7. DVT prophylaxis - Enoxaparin SubQ    All the records are reviewed and case discussed with ED provider. Management plans discussed with the patient, family and they are in agreement.  CODE STATUS: FULL  TOTAL TIME TAKING CARE OF THIS PATIENT: 50 minutes.    on 06/18/2019 at 5:56 AM  Rufina Falco, DNP, FNP-BC [facility] Nurse Practitioner Between 7am to 6pm - Pager [phone]  After 6pm go to [email or web address] - password [email or web address] [facility]  Office  [phone]  CC: Primary care physician; [name], MD

## 2019-06-18 NOTE — ED Triage Notes (Signed)
Patient arrived from home in personal vehicle. Patient was awaken with chest pain around 2300. Patient took 4 baby asprin and 3 nitro tablets with no relief. EMS called, but patient's daughter brought to ED.

## 2019-06-18 NOTE — Plan of Care (Signed)
  Problem: Clinical Measurements: Goal: Respiratory complications will improve Outcome: Progressing Note: On room air   Problem: Activity: Goal: Risk for activity intolerance will decrease Outcome: Progressing Note: Independent in room , tolerating well   Problem: Nutrition: Goal: Adequate nutrition will be maintained Outcome: Progressing   Problem: Coping: Goal: Level of anxiety will decrease Outcome: Progressing   Problem: Elimination: Goal: Will not experience complications related to urinary retention Outcome: Progressing   Problem: Pain Managment: Goal: General experience of comfort will improve Outcome: Progressing Note: No complaints of pain   Problem: Safety: Goal: Ability to remain free from injury will improve Outcome: Progressing   Problem: Skin Integrity: Goal: Risk for impaired skin integrity will decrease Outcome: Progressing   Problem: Education: Goal: Knowledge of General Education information will improve Description: Including pain rating scale, medication(s)/side effects and non-pharmacologic comfort measures Outcome: Completed/Met

## 2019-06-18 NOTE — ED Provider Notes (Signed)
[facility] Emergency Department Provider Note   ____________________________________________   First MD Initiated Contact with Patient 06/18/19 0111     (approximate)  I have reviewed the triage vital signs and the nursing notes.   HISTORY  Chief Complaint Chest pain   HPI [name] is a 85 y.o. female who presents to the ED from home with a chief complaint of chest pain.  Patient was awakened approximately 2 hours ago with central chest pressure associated with nausea.  Took 4 baby aspirin and 3 sublingual nitroglycerin without significant relief of symptoms.  States she recently had a dobutamine stress test.  Denies fever, cough, shortness of breath, abdominal pain, vomiting.  Denies recent travel or trauma.       Past Medical History:  Diagnosis Date  . C. difficile colitis   . CAD (coronary artery disease)    a. Nuclear 10/11, not gated, no scar or ischemia, EF 68%, low risk study; b. [place] 06/18/14: no evidence of infarction or ischemia, no WMA, normalization of TWI in precordial leads, equivocal EKG changes EF 77%, low risk study  . Carotid artery disease ([place])    Doppler January, 2012, 40-59% bilateral, followup one year  . Closed fracture pubis ([place]) 07/04/2017  . COPD (chronic obstructive pulmonary disease) ([place])   . Diffuse cystic mastopathy   . Diverticulitis    last colonoscopy  incomplete Jan 2011  . Dizziness    stable now (Oct 2011) patient tells me question of TIA, we will obtain records  . Gait abnormality    Patient complains of "wobbly gait", December, 2013  . GERD (gastroesophageal reflux disease)   . History of migraines   . Hx of CABG    a. 3 vessel in 1999  . Hyperlipidemia   . Hypertension   . Indigestion    3 weeks, October 2011  . Kidney stones   . Rheumatic fever   . Sinus bradycardia    Mild, December, 2013  . SOB (shortness of breath)   . Syncopal episodes    after 18 holes of golf and  increase hydrochlorothiazide, resolved     Patient Active Problem List   Diagnosis Date Noted  . Mild malnutrition ([place]) 03/25/2019  . Diastolic CHF, chronic ([place]) 03/25/2019  . Vitamin D deficiency 03/25/2019  . Melena 02/03/2018  . Hearing loss secondary to cerumen impaction 12/26/2017  . S/P excision of skin lesion, follow-up exam 12/26/2017  . Impingement syndrome of left shoulder region 07/04/2017  . Neck pain 07/04/2017  . Allergic rhinitis 12/31/2016  . Hospital discharge follow-up 12/31/2016  . Change in bowel movement 08/20/2016  . SCC (squamous cell carcinoma), face 08/01/2016  . EKG abnormalities 06/17/2016  . Osteoporosis of forearm 02/13/2016  . Dizziness and giddiness 02/13/2016  . Other specified symptoms and signs involving the circulatory and respiratory systems 01/31/2016  . Insomnia secondary to anxiety 07/12/2015  . Anxiety disorder 07/12/2015  . Essential (primary) hypertension 10/07/2014  . Syncope and collapse   . Sinus bradycardia   . Hardening of the aorta (main artery of the heart) ([place]) 06/26/2014  . History of Clostridium difficile colitis 10/20/2013  . Personal history of other diseases of the digestive system 10/20/2013  . Low back pain 10/04/2013  . Encounter for general adult medical examination without abnormal findings 02/07/2013  . Chest pain at rest 12/26/2011  . COPD, moderate ([place]) 12/05/2011  . Presence of aortocoronary bypass graft   . Chronic kidney disease, stage III (  moderate) ([place]) 09/25/2011  . Hyperlipidemia   . Hypertension   . SOB (shortness of breath)   . Coronary artery disease of native artery of native heart with stable angina pectoris ([place])   . Carotid artery disease [facility])     Past Surgical History:  Procedure Laterality Date  . APPENDECTOMY  1950  . BREAST BIOPSY Right 1994  . BREAST CYST ASPIRATION     multiple BIL  . CATARACT EXTRACTION Right 2009  . CATARACT EXTRACTION Left 2011  . COLONOSCOPY  [id number]   Dr.  Jamal Collin  . CORONARY ARTERY BYPASS GRAFT  1999  . esophagus stretched    . TONSILLECTOMY  1939  . TOTAL ABDOMINAL HYSTERECTOMY  1968   due to metrorrhagia    Prior to Admission medications   Medication Sig Start Date End Date Taking? Authorizing Provider  amLODipine (NORVASC) 2.5 MG tablet TAKE 1 TABLET BY MOUTH DAILY 03/18/19  Yes [name], MD  clopidogrel (PLAVIX) 75 MG tablet TAKE ONE TABLET EVERY DAY 04/04/19  Yes [name], MD  esomeprazole (NEXIUM) 40 MG capsule TAKE 1 CAPSULE DAILY BEFORE BREAKFAST 05/09/13  Yes [name], MD  fluticasone (FLONASE) 50 MCG/ACT nasal spray Place 2 sprays into both nostrils daily. 03/21/18  Yes Laverle Hobby, MD  Ipratropium-Albuterol (COMBIVENT RESPIMAT) 20-100 MCG/ACT AERS respimat Inhale 1 puff into the lungs every 6 (six) hours as needed for wheezing. 07/23/18  Yes Wilhelmina Mcardle, MD  isosorbide mononitrate (IMDUR) 60 MG 24 hr tablet Take 1 tablet (60 mg total) by mouth 2 (two) times daily. . 05/03/19 08/01/19 Yes Gollan, Kathlene November, MD  LORazepam (ATIVAN) 0.5 MG tablet TAKE 1.5 TABLETS BY MOUTH AS NEEDED FOR INSOMNIA 04/23/19  Yes [name], MD  losartan (COZAAR) 100 MG tablet TAKE ONE TABLET EVERY DAY 04/04/19  Yes [name], MD  metoprolol succinate (TOPROL-XL) 25 MG 24 hr tablet Take 1 tablet (25 mg total) by mouth daily. Take with or immediately following a meal. 08/06/18  Yes Gollan, Kathlene November, MD  nitroGLYCERIN (NITROSTAT) 0.4 MG SL tablet PLACE 1 TABLET UNDER TONGUE EVERY 5 MIN AS NEEDED FOR CHEST PAIN IF NO RELIEF IN15 MIN CALL 911 (MAX 3 TABS) 03/05/19  Yes Gollan, Kathlene November, MD  simvastatin (ZOCOR) 40 MG tablet TAKE 1 TABLET BY MOUTH AT BEDTIME 05/13/19  Yes Gollan, Kathlene November, MD    Allergies Codeine  Family History  Problem Relation Age of Onset  . Heart disease Mother   . Heart disease Father   . Cancer Neg Hx   . Drug abuse Neg Hx   . Breast cancer Neg Hx     Social History Social History   Tobacco  Use  . Smoking status: Former Smoker    Packs/day: 0.50    Years: 40.00    Pack years: 20.00    Types: Cigarettes    Quit date: 09/20/1983    Years since quitting: 35.7  . Smokeless tobacco: Never Used  Substance Use Topics  . Alcohol use: Not Currently    Comment: yes, social wine  . Drug use: No    Review of Systems  Constitutional: No fever/chills Eyes: No visual changes. ENT: No sore throat. Cardiovascular: Positive for chest pain. Respiratory: Denies shortness of breath. Gastrointestinal: No abdominal pain.  Positive for nausea, no vomiting.  No diarrhea.  No constipation. Genitourinary: Negative for dysuria. Musculoskeletal: Negative for back pain. Skin: Negative for rash. Neurological: Negative for headaches, focal weakness or numbness.  ____________________________________________   PHYSICAL EXAM:  VITAL SIGNS: ED Triage Vitals  Enc Vitals Group     BP      Pulse      Resp      Temp      Temp src      SpO2      Weight      Height      Head Circumference      Peak Flow      Pain Score      Pain Loc      Pain Edu?      Excl. in [place]?     Constitutional: Alert and oriented.  Elderly appearing and in mild acute distress. Eyes: Conjunctivae are normal. PERRL. EOMI. Head: Atraumatic. Nose: No congestion/rhinnorhea. Mouth/Throat: Mucous membranes are moist.  Oropharynx non-erythematous. Neck: No stridor.   Cardiovascular: Normal rate, regular rhythm. Grossly normal heart sounds.  Good peripheral circulation. Respiratory: Normal respiratory effort.  No retractions. Lungs CTAB. Gastrointestinal: Soft and nontender. No distention. No abdominal bruits. No CVA tenderness. Musculoskeletal: No lower extremity tenderness nor edema.  No joint effusions. Neurologic:  Normal speech and language. No gross focal neurologic deficits are appreciated. No gait instability. Skin:  Skin is warm, dry and intact. No rash noted. Psychiatric: Mood and affect are normal. Speech  and behavior are normal.  ____________________________________________   LABS (all labs ordered are listed, but only abnormal results are displayed)  Labs Reviewed  COMPREHENSIVE METABOLIC PANEL - Abnormal; Notable for the following components:      Result Value   Glucose, Bld 119 (*)    Creatinine, Ser 1.31 (*)    Total Protein 6.0 (*)    GFR calc non Af Amer 36 (*)    GFR calc Af Amer 41 (*)    All other components within normal limits  SARS CORONAVIRUS 2 (HOSPITAL ORDER, [place] [facility])  CBC WITH DIFFERENTIAL/PLATELET  APTT  TROPONIN I (HIGH SENSITIVITY)  TROPONIN I (HIGH SENSITIVITY)   ____________________________________________  EKG  ED ECG REPORT I, Deshante Cassell J, the attending physician, personally viewed and interpreted this ECG.   Date: 06/18/2019  EKG Time: 0116  Rate: 52  Rhythm: normal EKG, normal sinus rhythm  Axis: Normal  Intervals:none  ST&T Change: Nonspecific  ____________________________________________  RADIOLOGY  ED MD interpretation: No acute cardiopulmonary process  Official radiology report(s): Dg Chest Port 1 View  Result Date: 06/18/2019 CLINICAL DATA:  Chest pain woke patient from sleep EXAM: PORTABLE CHEST 1 VIEW COMPARISON:  Most recent comparison radiograph 03/30/2018, cardiac SPECT 03/07/2019 FINDINGS: Postsurgical changes related to prior CABG including intact and aligned sternotomy wires and multiple surgical clips projecting over the mediastinum. The aorta is calcified. The remaining cardiomediastinal contours are unremarkable. Moderate hiatal hernia, stable from prior. Lung hyperinflation with flattening of the diaphragms and relative apically lucency likely related to COPD. No consolidation, features of edema, pneumothorax, or effusion. Pulmonary vascularity is normally distributed. No acute osseous or soft tissue abnormality. Degenerative changes are present in the imaged spine and shoulders. IMPRESSION: 1.  Postsurgical changes related to prior CABG. No active cardiopulmonary disease. 2. Unchanged moderate hiatal hernia. 3. No acute cardiopulmonary abnormality. 4. Hyperinflation and features compatible with COPD. Electronically Signed   By: Lovena Le M.D.   On: 06/18/2019 01:36    ____________________________________________   PROCEDURES  Procedure(s) performed (including Critical Care):  Procedures  CRITICAL CARE Performed by: Paulette Blanch   Total critical care time: 30 minutes  Critical care time  was exclusive of separately billable procedures and treating other patients.  Critical care was necessary to treat or prevent imminent or life-threatening deterioration.  Critical care was time spent personally by me on the following activities: development of treatment plan with patient and/or surrogate as well as nursing, discussions with consultants, evaluation of patient's response to treatment, examination of patient, obtaining history from patient or surrogate, ordering and performing treatments and interventions, ordering and review of laboratory studies, ordering and review of radiographic studies, pulse oximetry and re-evaluation of patient's condition.  ____________________________________________   INITIAL IMPRESSION / ASSESSMENT AND PLAN / ED COURSE  As part of my medical decision making, I reviewed the following data within the [place] notes reviewed and incorporated, Labs reviewed, EKG interpreted, Old chart reviewed, Radiograph reviewed, Discussed with admitting physician and Notes from prior ED visits     [name] was evaluated in Emergency Department on 06/18/2019 for the symptoms described in the history of present illness. She was evaluated in the context of the global COVID-19 pandemic, which necessitated consideration that the patient might be at risk for infection with the SARS-CoV-2 virus that causes COVID-19. Institutional protocols  and algorithms that pertain to the evaluation of patients at risk for COVID-19 are in a state of rapid change based on information released by regulatory bodies including the CDC and federal and state organizations. These policies and algorithms were followed during the patient's care in the ED.    85 year old female with CAD status post CABG who presents with chest pain concerning for unstable angina. Differential diagnosis includes, but is not limited to, ACS, aortic dissection, pulmonary embolism, cardiac tamponade, pneumothorax, pneumonia, pericarditis, myocarditis, GI-related causes including esophagitis/gastritis, and musculoskeletal chest wall pain.    Patient had aspirin and 3 nitroglycerin prior to arrival.  Will administer low-dose morphine.  With Zofran for nausea.  Reviewed patient's chart and see that she had an appointment with her cardiologist Dr. Rockey Situ over the summer.  Looks like she is refusing cardiac cath.  Her Imdur was increased to 60 mg a few weeks ago.  Will obtain cardiac work-up, rapid COVID in the event patient requires procedure; anticipate hospitalization.   Clinical Course as of Jun 17 324  Tue Jun 18, 2019  0210 Heart rate in the upper 40s.  Three-page rhythm strip demonstrates sinus bradycardia.  Patient is noted to be on metoprolol.   [JS]  0211 CP improved after IV Morphine.   [JS]  [id number] Updated patient and family member on all test results.  Will discuss with hospitalist to evaluate patient in the emergency department for admission.   [JS]    Clinical Course User Index [JS] Paulette Blanch, MD     ____________________________________________   FINAL CLINICAL IMPRESSION(S) / ED DIAGNOSES  Final diagnoses:  Unstable angina pectoris [facility])  Chest pain, unspecified type     ED Discharge Orders    None       Note:  This document was prepared using Dragon voice recognition software and may include unintentional dictation errors.   Paulette Blanch, MD  06/18/19 [phone]

## 2019-06-18 NOTE — Discharge Summary (Signed)
[facility] [place] NAME: [name]    MR#:  [id number]  DATE OF BIRTH:  03/07/1926  DATE OF ADMISSION:  06/18/2019 ADMITTING PHYSICIAN: Lang Snow, NP  DATE OF DISCHARGE: 06/18/2019  PRIMARY CARE PHYSICIAN: [name], MD    ADMISSION DIAGNOSIS:  Unstable angina pectoris ([place]) [I20.0] Chest pain, unspecified type [R07.9]  DISCHARGE DIAGNOSIS:  Active Problems:   Chest pain with high risk of acute coronary syndrome   SECONDARY DIAGNOSIS:   Past Medical History:  Diagnosis Date  . C. difficile colitis   . CAD (coronary artery disease)    a. Nuclear 10/11, not gated, no scar or ischemia, EF 68%, low risk study; b. [place] 06/18/14: no evidence of infarction or ischemia, no WMA, normalization of TWI in precordial leads, equivocal EKG changes EF 77%, low risk study  . Carotid artery disease ([place])    Doppler January, 2012, 40-59% bilateral, followup one year  . Closed fracture pubis ([place]) 07/04/2017  . COPD (chronic obstructive pulmonary disease) ([place])   . Diffuse cystic mastopathy   . Diverticulitis    last colonoscopy  incomplete Jan 2011  . Dizziness    stable now (Oct 2011) patient tells me question of TIA, we will obtain records  . Gait abnormality    Patient complains of "wobbly gait", December, 2013  . GERD (gastroesophageal reflux disease)   . History of migraines   . Hx of CABG    a. 3 vessel in 1999  . Hyperlipidemia   . Hypertension   . Indigestion    3 weeks, October 2011  . Kidney stones   . Rheumatic fever   . Sinus bradycardia    Mild, December, 2013  . SOB (shortness of breath)   . Syncopal episodes    after 18 holes of golf and increase hydrochlorothiazide, resolved     HOSPITAL COURSE:   85 year old female with a history of CAD and essential hypertension with chronic kidney disease stage III who presents to the emergency room due to  chest pain.   1.  Chest pain:  Patient's chest pain was reproducible.  Patient was ruled out for ACS.  She had no acute events on telemetry.  She will continue her outpatient medications and have follow-up with her cardiologist.  2.  Acute on chronic kidney disease stage III: Creatinine has improved and is at baseline  3 CAD: Patient will continue outpatient medications including Plavix, metoprolol and Isosorbide.  She will be followed up by her primary cardiologist Dr. Rockey Situ.  4.  Hyperlipidemia: Continue statin  5.  Essential hypertension: She will continue Norvasc, isosorbide, losartan and metoprolol DISCHARGE CONDITIONS AND DIET:  Stable Regular diet  CONSULTS OBTAINED:    DRUG ALLERGIES:   Allergies  Allergen Reactions  . Codeine Nausea And Vomiting    Pt would like this removed. Not allergic to codeine.     DISCHARGE MEDICATIONS:   Allergies as of 06/18/2019      Reactions   Codeine Nausea And Vomiting   Pt would like this removed. Not allergic to codeine.       Medication List    TAKE these medications   amLODipine 2.5 MG tablet Commonly known as: NORVASC TAKE 1 TABLET BY MOUTH DAILY   clopidogrel 75 MG tablet Commonly known as: PLAVIX TAKE ONE TABLET EVERY DAY   esomeprazole 40 MG capsule Commonly known as: NexIUM TAKE 1 CAPSULE DAILY BEFORE BREAKFAST   fluticasone 50 MCG/ACT  nasal spray Commonly known as: FLONASE Place 2 sprays into both nostrils daily.   Ipratropium-Albuterol 20-100 MCG/ACT Aers respimat Commonly known as: Combivent Respimat Inhale 1 puff into the lungs every 6 (six) hours as needed for wheezing.   isosorbide mononitrate 60 MG 24 hr tablet Commonly known as: IMDUR Take 1 tablet (60 mg total) by mouth 2 (two) times daily. Marland Kitchen   LORazepam 0.5 MG tablet Commonly known as: ATIVAN TAKE 1.5 TABLETS BY MOUTH AS NEEDED FOR INSOMNIA   losartan 100 MG tablet Commonly known as: COZAAR TAKE ONE TABLET EVERY DAY   metoprolol succinate 25 MG 24 hr tablet Commonly  known as: TOPROL-XL Take 1 tablet (25 mg total) by mouth daily. Take with or immediately following a meal.   nitroGLYCERIN 0.4 MG SL tablet Commonly known as: NITROSTAT PLACE 1 TABLET UNDER TONGUE EVERY 5 MIN AS NEEDED FOR CHEST PAIN IF NO RELIEF IN15 MIN CALL 911 (MAX 3 TABS)   simvastatin 40 MG tablet Commonly known as: ZOCOR TAKE 1 TABLET BY MOUTH AT BEDTIME         Today   CHIEF COMPLAINT:  No chest pain or SOB doing well   VITAL SIGNS:  Blood pressure (!) 143/48, pulse (!) 49, temperature 98.1 F (36.7 C), resp. rate 18, height 5\' 3"  (1.6 m), weight 59 kg, SpO2 97 %.   REVIEW OF SYSTEMS:  Review of Systems  Constitutional: Negative.  Negative for chills, fever and malaise/fatigue.  HENT: Negative.  Negative for ear discharge, ear pain, hearing loss, nosebleeds and sore throat.   Eyes: Negative.  Negative for blurred vision and pain.  Respiratory: Negative.  Negative for cough, hemoptysis, shortness of breath and wheezing.   Cardiovascular: Negative.  Negative for chest pain, palpitations and leg swelling.  Gastrointestinal: Negative.  Negative for abdominal pain, blood in stool, diarrhea, nausea and vomiting.  Genitourinary: Negative.  Negative for dysuria.  Musculoskeletal: Negative.  Negative for back pain.  Skin: Negative.   Neurological: Negative for dizziness, tremors, speech change, focal weakness, seizures and headaches.  Endo/Heme/Allergies: Negative.  Does not bruise/bleed easily.  Psychiatric/Behavioral: Negative.  Negative for depression, hallucinations and suicidal ideas.     PHYSICAL EXAMINATION:  GENERAL:  85 y.o.-year-old patient lying in the bed with no acute distress.  NECK:  Supple, no jugular venous distention. No thyroid enlargement, no tenderness.  LUNGS: Normal breath sounds bilaterally, no wheezing, rales,rhonchi  No use of accessory muscles of respiration.  CARDIOVASCULAR: S1, S2 normal. No murmurs, rubs, or gallops.  M/s pain  ABDOMEN:  Soft, non-tender, non-distended. Bowel sounds present. No organomegaly or mass.  EXTREMITIES: No pedal edema, cyanosis, or clubbing.  PSYCHIATRIC: The patient is alert and oriented x 3.  SKIN: No obvious rash, lesion, or ulcer.   DATA REVIEW:   CBC Recent Labs  Lab 06/18/19 0126  WBC 6.1  HGB 12.5  HCT 37.7  PLT 154    Chemistries  Recent Labs  Lab 06/18/19 0126 06/18/19 0834  NA 141 141  K 3.6 4.4  CL 107 108  CO2 27 25  GLUCOSE 119* 100*  BUN 22 20  CREATININE 1.31* 1.22*  CALCIUM 9.0 8.6*  AST 17  --   ALT 13  --   ALKPHOS 58  --   BILITOT 0.6  --     Cardiac Enzymes No results for input(s): TROPONINI in the last 168 hours.  Microbiology Results  @MICRORSLT48 @  RADIOLOGY:  Dg Chest Port 1 View  Result Date: 06/18/2019 CLINICAL DATA:  Chest pain woke patient from sleep EXAM: PORTABLE CHEST 1 VIEW COMPARISON:  Most recent comparison radiograph 03/30/2018, cardiac SPECT 03/07/2019 FINDINGS: Postsurgical changes related to prior CABG including intact and aligned sternotomy wires and multiple surgical clips projecting over the mediastinum. The aorta is calcified. The remaining cardiomediastinal contours are unremarkable. Moderate hiatal hernia, stable from prior. Lung hyperinflation with flattening of the diaphragms and relative apically lucency likely related to COPD. No consolidation, features of edema, pneumothorax, or effusion. Pulmonary vascularity is normally distributed. No acute osseous or soft tissue abnormality. Degenerative changes are present in the imaged spine and shoulders. IMPRESSION: 1. Postsurgical changes related to prior CABG. No active cardiopulmonary disease. 2. Unchanged moderate hiatal hernia. 3. No acute cardiopulmonary abnormality. 4. Hyperinflation and features compatible with COPD. Electronically Signed   By: Lovena Le M.D.   On: 06/18/2019 01:36      Allergies as of 06/18/2019      Reactions   Codeine Nausea And Vomiting   Pt would  like this removed. Not allergic to codeine.       Medication List    TAKE these medications   amLODipine 2.5 MG tablet Commonly known as: NORVASC TAKE 1 TABLET BY MOUTH DAILY   clopidogrel 75 MG tablet Commonly known as: PLAVIX TAKE ONE TABLET EVERY DAY   esomeprazole 40 MG capsule Commonly known as: NexIUM TAKE 1 CAPSULE DAILY BEFORE BREAKFAST   fluticasone 50 MCG/ACT nasal spray Commonly known as: FLONASE Place 2 sprays into both nostrils daily.   Ipratropium-Albuterol 20-100 MCG/ACT Aers respimat Commonly known as: Combivent Respimat Inhale 1 puff into the lungs every 6 (six) hours as needed for wheezing.   isosorbide mononitrate 60 MG 24 hr tablet Commonly known as: IMDUR Take 1 tablet (60 mg total) by mouth 2 (two) times daily. Marland Kitchen   LORazepam 0.5 MG tablet Commonly known as: ATIVAN TAKE 1.5 TABLETS BY MOUTH AS NEEDED FOR INSOMNIA   losartan 100 MG tablet Commonly known as: COZAAR TAKE ONE TABLET EVERY DAY   metoprolol succinate 25 MG 24 hr tablet Commonly known as: TOPROL-XL Take 1 tablet (25 mg total) by mouth daily. Take with or immediately following a meal.   nitroGLYCERIN 0.4 MG SL tablet Commonly known as: NITROSTAT PLACE 1 TABLET UNDER TONGUE EVERY 5 MIN AS NEEDED FOR CHEST PAIN IF NO RELIEF IN15 MIN CALL 911 (MAX 3 TABS)   simvastatin 40 MG tablet Commonly known as: ZOCOR TAKE 1 TABLET BY MOUTH AT BEDTIME           Management plans discussed with the patient and she is in agreement. Stable for discharge home with [facility]  Patient should follow up with cardiology  CODE STATUS:     Code Status Orders  (From admission, onward)         Start     Ordered   06/18/19 0347  Full code  Continuous     06/18/19 0347        Code Status History    Date Active Date Inactive Code Status Order ID Comments User Context   09/07/2017 1542 09/08/2017 1621 Full Code [id number]  Nicholes Mango, MD Inpatient   03/08/2017 1846 03/10/2017 1604 Full Code  [id number]  Gladstone Lighter, MD Inpatient   12/21/2016 2159 12/23/2016 1342 Full Code [id number]  Gladstone Lighter, MD ED   Advance Care Planning Activity    Advance Directive Documentation     Most Recent Value  Type of Advance Directive  Living will  Pre-existing out of facility  DNR order (yellow form or pink MOST form)  -  "MOST" Form in Place?  -      TOTAL TIME TAKING CARE OF THIS PATIENT: 38 minutes.    Note: This dictation was prepared with Dragon dictation along with smaller phrase technology. Any transcriptional errors that result from this process are unintentional.  Bettey Costa M.D on 06/18/2019 at 10:49 AM  Between 7am to 6pm - Pager - [phone] After 6pm go to [email or web address] - password [email or web address] [facility]  Office  [phone]  CC: Primary care physician; [name], MD

## 2019-06-18 NOTE — ED Triage Notes (Signed)
Pt arrived to ED via private auto with c/o sudden onset of chest pain a couple of hours ago. Pain woke pt up from sleep. + Cardiac hx. C/o slight sob and nasuea. No increased work of breathing noted at this time. Skin warm and dry.

## 2019-06-18 NOTE — ED Notes (Signed)
ED TO INPATIENT HANDOFF REPORT  ED Nurse Name and Phone #: Joelene Millin [phone]  S Name/Age/Gender [name] 85 y.o. female Room/Bed: ED18A/ED18A  Code Status   Code Status: Full Code  Home/SNF/Other Home Patient oriented to: self, place, time and situation Is this baseline? Yes   Triage Complete: Triage complete  Chief Complaint Heart issues  Triage Note Pt arrived to ED via private auto with c/o sudden onset of chest pain a couple of hours ago. Pain woke pt up from sleep. + Cardiac hx. C/o slight sob and nasuea. No increased work of breathing noted at this time. Skin warm and dry.   Patient arrived from home in personal vehicle. Patient was awaken with chest pain around 2300. Patient took 4 baby asprin and 3 nitro tablets with no relief. EMS called, but patient's daughter brought to ED.    Allergies Allergies  Allergen Reactions  . Codeine Nausea And Vomiting    Pt would like this removed. Not allergic to codeine.     Level of Care/Admitting Diagnosis ED Disposition    ED Disposition Condition [facility] Area: [place] [[id number]]  Level of Care: Telemetry [5]  Covid Evaluation: Confirmed COVID Negative  Diagnosis: Chest pain with high risk of acute coronary syndrome [203559]  Admitting Physician: Lang Snow [[id number]]  Attending Physician: Rufina Falco ACHIENG [[id number]]  PT Class (Do Not Modify): Observation [104]  PT Acc Code (Do Not Modify): Observation [10022]       B Medical/Surgery History Past Medical History:  Diagnosis Date  . C. difficile colitis   . CAD (coronary artery disease)    a. Nuclear 10/11, not gated, no scar or ischemia, EF 68%, low risk study; b. [place] 06/18/14: no evidence of infarction or ischemia, no WMA, normalization of TWI in precordial leads, equivocal EKG changes EF 77%, low risk study  . Carotid artery disease ([place])    Doppler January, 2012, 40-59% bilateral,  followup one year  . Closed fracture pubis ([place]) 07/04/2017  . COPD (chronic obstructive pulmonary disease) ([place])   . Diffuse cystic mastopathy   . Diverticulitis    last colonoscopy  incomplete Jan 2011  . Dizziness    stable now (Oct 2011) patient tells me question of TIA, we will obtain records  . Gait abnormality    Patient complains of "wobbly gait", December, 2013  . GERD (gastroesophageal reflux disease)   . History of migraines   . Hx of CABG    a. 3 vessel in 1999  . Hyperlipidemia   . Hypertension   . Indigestion    3 weeks, October 2011  . Kidney stones   . Rheumatic fever   . Sinus bradycardia    Mild, December, 2013  . SOB (shortness of breath)   . Syncopal episodes    after 18 holes of golf and increase hydrochlorothiazide, resolved    Past Surgical History:  Procedure Laterality Date  . APPENDECTOMY  1950  . BREAST BIOPSY Right 1994  . BREAST CYST ASPIRATION     multiple BIL  . CATARACT EXTRACTION Right 2009  . CATARACT EXTRACTION Left 2011  . COLONOSCOPY  [id number]   Dr. Jamal Collin  . CORONARY ARTERY BYPASS GRAFT  1999  . esophagus stretched    . TONSILLECTOMY  1939  . TOTAL ABDOMINAL HYSTERECTOMY  1968   due to metrorrhagia     A IV Location/Drains/Wounds Patient Lines/Drains/Airways Status   Active Line/Drains/Airways    Name:  Placement date:   Placement time:   Site:   Days:   Peripheral IV 06/18/19 Left Antecubital   06/18/19    0139    Antecubital   less than 1   Peripheral IV 06/18/19 Left Wrist   06/18/19    0139    Wrist   less than 1          Intake/Output Last 24 hours No intake or output data in the 24 hours ending 06/18/19 0407  Labs/Imaging Results for orders placed or performed during the hospital encounter of 06/18/19 (from the past 48 hour(s))  CBC with Differential/Platelet     Status: None   Collection Time: 06/18/19  1:26 AM  Result Value Ref Range   WBC 6.1 4.0 - 10.5 K/uL   RBC 3.92 3.87 - 5.11 MIL/uL   Hemoglobin  12.5 12.0 - 15.0 g/dL   HCT 37.7 36.0 - 46.0 %   MCV 96.2 80.0 - 100.0 fL   MCH 31.9 26.0 - 34.0 pg   MCHC 33.2 30.0 - 36.0 g/dL   RDW 12.1 11.5 - 15.5 %   Platelets 154 150 - 400 K/uL   nRBC 0.0 0.0 - 0.2 %   Neutrophils Relative % 50 %   Neutro Abs 3.1 1.7 - 7.7 K/uL   Lymphocytes Relative 33 %   Lymphs Abs 2.0 0.7 - 4.0 K/uL   Monocytes Relative 12 %   Monocytes Absolute 0.7 0.1 - 1.0 K/uL   Eosinophils Relative 4 %   Eosinophils Absolute 0.2 0.0 - 0.5 K/uL   Basophils Relative 1 %   Basophils Absolute 0.1 0.0 - 0.1 K/uL   Immature Granulocytes 0 %   Abs Immature Granulocytes 0.01 0.00 - 0.07 K/uL    Comment: Performed at [facility], [address] [place], [place] [address]  Comprehensive metabolic panel     Status: Abnormal   Collection Time: 06/18/19  1:26 AM  Result Value Ref Range   Sodium 141 135 - 145 mmol/L   Potassium 3.6 3.5 - 5.1 mmol/L   Chloride 107 98 - 111 mmol/L   CO2 27 22 - 32 mmol/L   Glucose, Bld 119 (H) 70 - 99 mg/dL   BUN 22 8 - 23 mg/dL   Creatinine, Ser 1.31 (H) 0.44 - 1.00 mg/dL   Calcium 9.0 8.9 - 10.3 mg/dL   Total Protein 6.0 (L) 6.5 - 8.1 g/dL   Albumin 3.8 3.5 - 5.0 g/dL   AST 17 15 - 41 U/L   ALT 13 0 - 44 U/L   Alkaline Phosphatase 58 38 - 126 U/L   Total Bilirubin 0.6 0.3 - 1.2 mg/dL   GFR calc non Af Amer 36 (L) >60 mL/min   GFR calc Af Amer 41 (L) >60 mL/min   Anion gap 7 5 - 15    Comment: Performed at [facility], [place], [place] [address]  Troponin I (High Sensitivity)     Status: None   Collection Time: 06/18/19  1:26 AM  Result Value Ref Range   Troponin I (High Sensitivity) 7 <18 ng/L    Comment: (NOTE) Elevated high sensitivity troponin I (hsTnI) values and significant  changes across serial measurements may suggest ACS but many other  chronic and acute conditions are known to elevate hsTnI results.  Refer to the "Links" section for chest pain algorithms and additional   guidance. Performed at [facility], [address], [place], [place] [address]  APTT     Status: None   Collection Time: 06/18/19  1:26 AM  Result Value Ref Range   aPTT 25 24 - 36 seconds    Comment: Performed at [facility], [address] [place], [place] [address]  SARS Coronavirus 2 [facility] order, Performed in [facility] hospital lab) Nasopharyngeal Nasopharyngeal Swab     Status: None   Collection Time: 06/18/19  1:28 AM   Specimen: Nasopharyngeal Swab  Result Value Ref Range   SARS Coronavirus 2 NEGATIVE NEGATIVE    Comment: (NOTE) If result is NEGATIVE SARS-CoV-2 target nucleic acids are NOT DETECTED. The SARS-CoV-2 RNA is generally detectable in upper and lower  respiratory specimens during the acute phase of infection. The lowest  concentration of SARS-CoV-2 viral copies this assay can detect is 250  copies / mL. A negative result does not preclude SARS-CoV-2 infection  and should not be used as the sole basis for treatment or other  patient management decisions.  A negative result may occur with  improper specimen collection / handling, submission of specimen other  than nasopharyngeal swab, presence of viral mutation(s) within the  areas targeted by this assay, and inadequate number of viral copies  (<250 copies / mL). A negative result must be combined with clinical  observations, patient history, and epidemiological information. If result is POSITIVE SARS-CoV-2 target nucleic acids are DETECTED. The SARS-CoV-2 RNA is generally detectable in upper and lower  respiratory specimens dur ing the acute phase of infection.  Positive  results are indicative of active infection with SARS-CoV-2.  Clinical  correlation with patient history and other diagnostic information is  necessary to determine patient infection status.  Positive results do  not rule out bacterial infection or co-infection with other viruses. If result is PRESUMPTIVE  POSTIVE SARS-CoV-2 nucleic acids MAY BE PRESENT.   A presumptive positive result was obtained on the submitted specimen  and confirmed on repeat testing.  While 2019 novel coronavirus  (SARS-CoV-2) nucleic acids may be present in the submitted sample  additional confirmatory testing may be necessary for epidemiological  and / or clinical management purposes  to differentiate between  SARS-CoV-2 and other Sarbecovirus currently known to infect humans.  If clinically indicated additional testing with an alternate test  methodology [phone]) is advised. The SARS-CoV-2 RNA is generally  detectable in upper and lower respiratory sp ecimens during the acute  phase of infection. The expected result is Negative. Fact Sheet for Patients:  StrictlyIdeas.no Fact Sheet for Healthcare Providers: BankingDealers.co.za This test is not yet approved or cleared by the [place] FDA and has been authorized for detection and/or diagnosis of SARS-CoV-2 by FDA under an Emergency Use Authorization (EUA).  This EUA will remain in effect (meaning this test can be used) for the duration of the COVID-19 declaration under Section 564(b)(1) of the Act, 21 U.S.C. section 360bbb-3(b)(1), unless the authorization is terminated or revoked sooner. Performed at [facility], [address] [place], [place] [address]    Dg Chest Port 1 View  Result Date: 06/18/2019 CLINICAL DATA:  Chest pain woke patient from sleep EXAM: PORTABLE CHEST 1 VIEW COMPARISON:  Most recent comparison radiograph 03/30/2018, cardiac SPECT 03/07/2019 FINDINGS: Postsurgical changes related to prior CABG including intact and aligned sternotomy wires and multiple surgical clips projecting over the mediastinum. The aorta is calcified. The remaining cardiomediastinal contours are unremarkable. Moderate hiatal hernia, stable from prior. Lung hyperinflation with flattening of the diaphragms and  relative apically lucency likely related  to COPD. No consolidation, features of edema, pneumothorax, or effusion. Pulmonary vascularity is normally distributed. No acute osseous or soft tissue abnormality. Degenerative changes are present in the imaged spine and shoulders. IMPRESSION: 1. Postsurgical changes related to prior CABG. No active cardiopulmonary disease. 2. Unchanged moderate hiatal hernia. 3. No acute cardiopulmonary abnormality. 4. Hyperinflation and features compatible with COPD. Electronically Signed   By: Lovena Le M.D.   On: 06/18/2019 01:36    Pending Labs Unresulted Labs (From admission, onward)   None      Vitals/Pain Today's Vitals   06/18/19 0200 06/18/19 0230 06/18/19 0245 06/18/19 0300  BP: (!) 131/46 136/66  (!) 141/72  Pulse: (!) 40 (!) 44 (!) 43 (!) 46  Resp: 18 (!) 9 10 18   Temp:      TempSrc:      SpO2: 96% 96% 96% 96%  Weight:      Height:      PainSc:        Isolation Precautions Airborne and Contact precautions  Medications Medications  amLODipine (NORVASC) tablet 2.5 mg (has no administration in time range)  isosorbide mononitrate (IMDUR) 24 hr tablet 60 mg (has no administration in time range)  nitroGLYCERIN (NITROSTAT) SL tablet 0.4 mg (has no administration in time range)  simvastatin (ZOCOR) tablet 40 mg (has no administration in time range)  LORazepam (ATIVAN) tablet 0.5 mg (has no administration in time range)  pantoprazole (PROTONIX) EC tablet 40 mg (has no administration in time range)  clopidogrel (PLAVIX) tablet 75 mg (has no administration in time range)  fluticasone (FLONASE) 50 MCG/ACT nasal spray 2 spray (has no administration in time range)  Ipratropium-Albuterol (COMBIVENT) respimat 1 puff (has no administration in time range)  aspirin EC tablet 81 mg (has no administration in time range)  acetaminophen (TYLENOL) tablet 650 mg (has no administration in time range)  ondansetron (ZOFRAN) injection 4 mg (has no administration in  time range)  enoxaparin (LOVENOX) injection 40 mg (has no administration in time range)  morphine 2 MG/ML injection 2 mg (2 mg Intravenous Given 06/18/19 0129)  ondansetron (ZOFRAN) injection 4 mg (4 mg Intravenous Given 06/18/19 0129)    Mobility walks High fall risk   Focused Assessments Cardiac Assessment Handoff:  Cardiac Rhythm: Sinus bradycardia Lab Results  Component Value Date   CKTOTAL 77 06/17/2014   CKMB 1.1 06/17/2014   TROPONINI <0.03 02/25/2019   No results found for: DDIMER Does the Patient currently have chest pain? No     R Recommendations: See Admitting Provider Note  Report given to:   Additional Notes:

## 2019-06-18 NOTE — Progress Notes (Signed)
Patient alert and oriented, vss, no complaints of pain.  Will d/c home today with daughter.

## 2019-06-19 ENCOUNTER — Telehealth: Payer: Self-pay

## 2019-06-19 NOTE — Telephone Encounter (Signed)
Unable to reach patient for transitional care management. No answer. Left a message regarding the nature of call. Will follow. Hospital follow up scheduled prior to discharge on 06/26/19 at 11:00 with her pcp.

## 2019-06-19 NOTE — Telephone Encounter (Signed)
Copied from [employer] [phone]. Topic: Appointment Scheduling - Scheduling Inquiry for Clinic >> Jun 19, 2019 12:31 PM Oneta Rack wrote: Reason for CRM: patient would like 06/26/2019 appointment to be a telephone call, patient states she prefers In Office ( PCP has no in office availability). Patient would like detail message confirming her telephone visit appointment

## 2019-06-20 NOTE — Telephone Encounter (Signed)
Transition Care Management Follow-up Telephone Call   Date discharged? 06/18/19   How have you been since you were released from the hospital? Patient states, "I am doing fine. No angina since returning home. I am not going to exert myself too much." Denies headache, dizziness, nausea and all other symptoms.    Do you understand why you were in the hospital? Yes, angina.   Do you understand the discharge instructions? Yes, increase activity as tolerated and follow up with pcp and cardiologist.   Where were you discharged to? Home.   Items Reviewed:  Medications reviewed: Yes, taking all scheduled medications without issues. No problems at all.  Allergies reviewed: Yes, none new.   Dietary changes reviewed: Yes, no change.   Referrals reviewed: Yes, previously scheduled with cardiology. Chest xray taken while in hospital.   Functional Questionnaire:   Activities of Daily Living (ADLs):   She states they are independent in the following: Independent in all ADLs States they require assistance with the following: She does not require assistance with any ADLs   Any transportation issues/concerns?: None. She drives with no complications.    Any patient concerns? None.   Confirmed importance and date/time of follow-up visits scheduled Yes, 06/26/19 at 4:00 in office.   Provider Appointment booked with Dr. Derrel Nip, pcp.  Confirmed with patient if condition begins to worsen call PCP or go to the ER.  Patient was given the office number and encouraged to call back with question or concerns.  : Yes

## 2019-06-21 ENCOUNTER — Other Ambulatory Visit: Payer: Self-pay | Admitting: *Deleted

## 2019-06-21 ENCOUNTER — Encounter: Payer: Self-pay | Admitting: *Deleted

## 2019-06-21 NOTE — Patient Outreach (Signed)
[facility]) [place] Telephone Outreach PCP office completes Transition of Care follow up post-hospital discharge Post-hospital discharge day #   06/21/2019  [name] 1925/11/25 [id number]  Telephone outreach to [name], 85 y/o female, routine referred to [place] by [place] 06/17/2019 for self health management of CHF.  Patient has history including, but not limited to, CHF; COPD; HTN/ HLD; CAD; CKD- III.  Noted from review of EMR that patient experienced post- referral observation hospitalization September 28- 29, 2020 for chest pain; patient was ruled out for ACS/ MI and was discharged home to self-care without home health services in place.  HIPAA/ identity verified with patient during phone call today; [facility] CM services were discussed with patient, who provides verbal consent for [facility] CM involvement in her care.  Pleasant 25 minute phone call  Today, patient reports that she "is doing just fine and feeling much better" after recent hospital visit; states that she occasionally gets chest pain, and reports that during most recent episode, she took 3 NTG and called EMS when she did not get relief from NTG.  States that she was told she did not have a heart attack, and was hospitalized for monitoring only.  She denies pain and new/ recent falls today post-hospital discharge.  Patient sounds to be in no distress throughout phone call today.  Patient further reports:  Medications: -- Has all medicationsand takes as prescribed;denies questions/ concerns about current medications.  -- self-manage medications using weekly pill planner box. -- denies issues with swallowing medications -- declines medication review today; but is agreeable for same with next outreach  Provider appointments: -- All upcoming provider appointments were reviewed with patient today ---- PCP 06/26/2019 ---- cardiology 07/01/2019  Safety/  Mobility/ Falls: -- denies new/ recent falls- states has only had one fall, 4 years ago -- assistive devices: none -- general fall risks/ prevention education discussed with patient today  Holiday representative needs: -- currently denies community resource needs, stating supportive local family members that assist with care needs as indicated -- reports lives alone and is independent in all ADL's and iADL's -- patient still drives and provides her own transportation to all provider appointments, errands, etc -- SDOH completed for: depression; transportation; Environmental education officer (AD) Planning:   --reports does currently have exisisting AD in place for HCPOA and Living Will, and declines desire to make changes -- endorses that she is "a full code" but also that it would "depend on the circumstances;" stating that her daughter understands her wishes  Self-health management of chronic disease state of CHF/ CAD/ COPD: -- reports that she was playing golf up until about a month ago; has noticed over last few weeks that she gets tired easier than she has in the past -- does not follow any specific diet; "eats healthy" -- does not perform daily self-health monitoring for daily weights, but does monitor weights at home 3-4 times weekly; reports weights are stable "stay around 130 lbs" and confirms that she has a tendency to lose weight instead of gain weight -- basics of value of daily/ weekly weight monitoring discussed with patient, along with weight gain guidelines in setting of CHF with corresponding action plan; provided education around weight gain being earliest indicator of fluid retention  Patient denies further issues, concerns, or problems today.  I provided/ confirmed that patient has my direct phone number, the main [facility] CM office phone number,  and the [facility] CM 24-hour nurse advice phone number should issues arise prior to next scheduled [place] outreach.   Encouraged patient to contact me directly if needs, questions, issues, or concerns arise prior to next scheduled outreach; patient agreed to do so.  Plan:  Patient will take medications as prescribed and will attend all scheduled provider appointments  Patient will promptly notify care providers for any new concerns/ issues/ problems that arise  Patient will continue monitoring/ recording daily weights at home 3-4 times per week  I will make patient's PCP aware of [facility] RN CM involvement in patient's care-- will send barriers letter  [place] outreach to continue with scheduled phone call next week post- scheduled PCP appointment to complete post-hospital discharge medication review and [facility] CM initial assessment   [facility] CM Care Plan Problem One     Most Recent Value  Care Plan Problem One  High Risk for hospital readmission related to/ as evidenced by recent hospital visit for chest pain September 28-29, 2020  Role Documenting the Problem One  Care Management Coordinator  Care Plan for Problem One  Active  [facility] Long Term Goal   Over the next 31 days, patient will not experience hospital readmission, as evidenced by patient reporting and review of EMR during [facility] RN CM outreach  [facility] Long Term Goal Start Date  06/21/19  Interventions for Problem One Long Term Goal  Discussed with patient current clinical condition and confirmed that she does not have current clinical concerns post-hospital discharge,  reviewed with patient upcoming scheduled provider appointments and confirmed that she will transport herself to provider appointments and plans to attend all scheduled appointments,  [facility] CM program initiated     I appreciate the opportunity to participate in [name]'s care,  Oneta Rack, RN, BSN, [employer] Coordinator [facility] Care Management  [phone]

## 2019-06-26 ENCOUNTER — Other Ambulatory Visit: Payer: Self-pay

## 2019-06-26 ENCOUNTER — Encounter: Payer: Self-pay | Admitting: Internal Medicine

## 2019-06-26 ENCOUNTER — Ambulatory Visit (INDEPENDENT_AMBULATORY_CARE_PROVIDER_SITE_OTHER): Payer: Medicare Other | Admitting: Internal Medicine

## 2019-06-26 DIAGNOSIS — R0602 Shortness of breath: Secondary | ICD-10-CM | POA: Diagnosis not present

## 2019-06-26 DIAGNOSIS — Z09 Encounter for follow-up examination after completed treatment for conditions other than malignant neoplasm: Secondary | ICD-10-CM | POA: Diagnosis not present

## 2019-06-26 DIAGNOSIS — I25118 Atherosclerotic heart disease of native coronary artery with other forms of angina pectoris: Secondary | ICD-10-CM | POA: Diagnosis not present

## 2019-06-26 NOTE — Progress Notes (Signed)
.   Subjective:  Patient ID: [name], female    DOB: 01/13/1926  Age: 85 y.o. MRN: [id number]  CC: Diagnoses of Coronary artery disease of native artery of native heart with stable angina pectoris ([place]), SOB (shortness of breath), and Hospital discharge follow-up were pertinent to this visit.  HPI [name] presents for hospital follow up .   Patient was admitted to [facility] on Sept 29 with chest pain, not resolved with NTG x 3  And asa x 4 taken at home.  The admitting diagnosis was unstable angina . She ruled out for AMI  the pain was reducible,  And there were no telemertry  Changes  Still feels "weak in the chest"  When she exerts herself at home.  Denies chest pain ,  Diaphoresis,  But does feel short of breath with exertion. Ambulatory sats today 96%    2) her right lower extremity is enflamed and swollen from recent topical chemo treatment  For squamous  Cell CA   Outpatient Medications Prior to Visit  Medication Sig Dispense Refill   amLODipine (NORVASC) 2.5 MG tablet TAKE 1 TABLET BY MOUTH DAILY 90 tablet 1   clopidogrel (PLAVIX) 75 MG tablet TAKE ONE TABLET EVERY DAY 90 tablet 2   esomeprazole (NEXIUM) 40 MG capsule TAKE 1 CAPSULE DAILY BEFORE BREAKFAST 90 capsule 1   fluticasone (FLONASE) 50 MCG/ACT nasal spray Place 2 sprays into both nostrils daily. 16 g 2   Ipratropium-Albuterol (COMBIVENT RESPIMAT) 20-100 MCG/ACT AERS respimat Inhale 1 puff into the lungs every 6 (six) hours as needed for wheezing. 4 g 5   isosorbide mononitrate (IMDUR) 60 MG 24 hr tablet Take 1 tablet (60 mg total) by mouth 2 (two) times daily. . 180 tablet 1   LORazepam (ATIVAN) 0.5 MG tablet TAKE 1.5 TABLETS BY MOUTH AS NEEDED FOR INSOMNIA 45 tablet 4   losartan (COZAAR) 100 MG tablet TAKE ONE TABLET EVERY DAY 90 tablet 2   metoprolol succinate (TOPROL-XL) 25 MG 24 hr tablet Take 1 tablet (25 mg total) by mouth daily. Take with or immediately following a meal. 90 tablet 3    nitroGLYCERIN (NITROSTAT) 0.4 MG SL tablet PLACE 1 TABLET UNDER TONGUE EVERY 5 MIN AS NEEDED FOR CHEST PAIN IF NO RELIEF IN15 MIN CALL 911 (MAX 3 TABS) 25 tablet 1   simvastatin (ZOCOR) 40 MG tablet TAKE 1 TABLET BY MOUTH AT BEDTIME 90 tablet 0   No facility-administered medications prior to visit.     Review of Systems;  Patient denies headache, fevers, malaise, unintentional weight loss, skin rash, eye pain, sinus congestion and sinus pain, sore throat, dysphagia,  hemoptysis , cough, dyspnea, wheezing, chest pain, palpitations, orthopnea, edema, abdominal pain, nausea, melena, diarrhea, constipation, flank pain, dysuria, hematuria, urinary  Frequency, nocturia, numbness, tingling, seizures,  Focal weakness, Loss of consciousness,  Tremor, insomnia, depression, anxiety, and suicidal ideation.      Objective:  BP (!) 154/62 (BP Location: Left Arm, Patient Position: Sitting, Cuff Size: Normal)    Pulse 71    Temp (!) 97.1 F (36.2 C) (Temporal)    Resp 17    Ht 5\' 3"  (1.6 m)    Wt 128 lb 12.8 oz (58.4 kg)    SpO2 96%    BMI 22.82 kg/m   BP Readings from Last 3 Encounters:  06/26/19 (!) 154/62  06/18/19 (!) 143/48  03/19/19 132/70    Wt Readings from Last 3 Encounters:  06/26/19 128 lb 12.8 oz (58.4  kg)  06/18/19 130 lb (59 kg)  03/19/19 128 lb (58.1 kg)    General appearance: alert, cooperative and appears stated age Ears: normal TM's and external ear canals both ears Throat: lips, mucosa, and tongue normal; teeth and gums normal Neck: no adenopathy, no carotid bruit, supple, symmetrical, trachea midline and thyroid not enlarged, symmetric, no tenderness/mass/nodules Back: symmetric, no curvature. ROM normal. No CVA tenderness. Lungs: clear to auscultation bilaterally Heart: regular rate and rhythm, S1, S2 normal, no murmur, click, rub or gallop Abdomen: soft, non-tender; bowel sounds normal; no masses,  no organomegaly Pulses: 2+ and symmetric Skin: erythematous desquamating  macular Skin color, texture, turgor normal. No rashes or lesions Lymph nodes: Cervical, supraclavicular, and axillary nodes normal.  Lab Results  Component Value Date   HGBA1C 5.8 01/30/2017   HGBA1C 5.8 09/26/2016    Lab Results  Component Value Date   CREATININE 1.22 (H) 06/18/2019   CREATININE 1.31 (H) 06/18/2019   CREATININE 1.22 (H) 04/02/2019    Lab Results  Component Value Date   WBC 6.1 06/18/2019   HGB 12.5 06/18/2019   HCT 37.7 06/18/2019   PLT 154 06/18/2019   GLUCOSE 100 (H) 06/18/2019   CHOL 120 04/02/2019   TRIG 135.0 04/02/2019   HDL 35.90 (L) 04/02/2019   LDLDIRECT 72.0 11/09/2015   LDLCALC 57 04/02/2019   ALT 13 06/18/2019   AST 17 06/18/2019   NA 141 06/18/2019   K 4.4 06/18/2019   CL 108 06/18/2019   CREATININE 1.22 (H) 06/18/2019   BUN 20 06/18/2019   CO2 25 06/18/2019   TSH 1.44 02/09/2015   INR 0.9 01/31/2018   HGBA1C 5.8 01/30/2017   MICROALBUR 2.5 (H) 12/26/2011    Dg Chest Port 1 View  Result Date: 06/18/2019 CLINICAL DATA:  Chest pain woke patient from sleep EXAM: PORTABLE CHEST 1 VIEW COMPARISON:  Most recent comparison radiograph 03/30/2018, cardiac SPECT 03/07/2019 FINDINGS: Postsurgical changes related to prior CABG including intact and aligned sternotomy wires and multiple surgical clips projecting over the mediastinum. The aorta is calcified. The remaining cardiomediastinal contours are unremarkable. Moderate hiatal hernia, stable from prior. Lung hyperinflation with flattening of the diaphragms and relative apically lucency likely related to COPD. No consolidation, features of edema, pneumothorax, or effusion. Pulmonary vascularity is normally distributed. No acute osseous or soft tissue abnormality. Degenerative changes are present in the imaged spine and shoulders. IMPRESSION: 1. Postsurgical changes related to prior CABG. No active cardiopulmonary disease. 2. Unchanged moderate hiatal hernia. 3. No acute cardiopulmonary abnormality. 4.  Hyperinflation and features compatible with COPD. Electronically Signed   By: Lovena Le M.D.   On: 06/18/2019 01:36    Assessment & Plan:   Problem List Items Addressed This Visit      Unprioritized   SOB (shortness of breath)    Ambulatory sats on room air remain at 96% .  May  Be a form of angina vs deconditioning       Coronary artery disease of native artery of native heart with stable angina pectoris ([place])    Her recent episode of chest pain did not rule in for AMI .  She is already taking Imdur 60 mg bid       Hospital discharge follow-up    Patient is stable post discharge and has no new issues or questions about discharge plans at the visit today for hospital follow up. All labs , imaging studies and progress notes from admission were reviewed with patient today  I am having [name] maintain her esomeprazole, fluticasone, Ipratropium-Albuterol, metoprolol succinate, nitroGLYCERIN, amLODipine, clopidogrel, losartan, LORazepam, isosorbide mononitrate, and simvastatin.  No orders of the defined types were placed in this encounter.   There are no discontinued medications.  Follow-up: No follow-ups on file.   [name], MD

## 2019-06-26 NOTE — Patient Instructions (Addendum)
You do not need oxygen .  Your feeling in the chest may still be due to your heart so I agree with seeing your cardiologist    If you would like to try cardiopulmonary rehab (PT) let me know

## 2019-06-27 ENCOUNTER — Other Ambulatory Visit: Payer: Self-pay | Admitting: *Deleted

## 2019-06-27 ENCOUNTER — Encounter: Payer: Self-pay | Admitting: *Deleted

## 2019-06-27 NOTE — Assessment & Plan Note (Signed)
Her recent episode of chest pain did not rule in for AMI .  She is already taking Imdur 60 mg bid

## 2019-06-27 NOTE — Assessment & Plan Note (Signed)
Ambulatory sats on room air remain at 96% .  May  Be a form of angina vs deconditioning

## 2019-06-27 NOTE — Assessment & Plan Note (Signed)
Patient is stable post discharge and has no new issues or questions about discharge plans at the visit today for hospital follow up. All labs , imaging studies and progress notes from admission were reviewed with patient today   

## 2019-06-27 NOTE — Patient Outreach (Signed)
[facility]) [place] Telephone Outreach PCP office completes Transition of Care follow up post-hospital discharge Post-hospital discharge day # 9  06/27/2019  [name] [birth year] [id number]  Successful telephone outreach to [name], 85 y/o female, routine referred to [place] by [place] 06/17/2019 for self health management of CHF.  Patient has history including, but not limited to, CHF; COPD; HTN/ HLD; CAD; CKD- III.  Noted from review of EMR that patient experienced post- referral observation hospitalization September 28- 29, 2020 for chest pain; patient was ruled out for ACS/ MI and was discharged home to self-care without home health services in place.  HIPAA/ identity verified with patient during phone call today; [facility] CM services were reiterated with patient, who again provides verbal consent for [facility] CM involvement in her care.  Pleasant 30 minute phone call  Today, patient reports that she "is still feeling much better" after recent hospital visit; states that she has not experienced chest pain recently, but has occasionally had a "heavy type feeling" in her heart with activity.  States "takes my time" to perform any activity, as she becomes winded "easier" than she has in the past.  Reports (L) leg pain from an ointment that was prescribed by her dermatology provider for skin cancer; states this is improving, as she has stopped the ointment and contacted her dermatologist.  Denies other pain today.  Patient sounds to be in no distress throughout phone call today.  Patient further reports:  -- Has all medicationsand takes as prescribed;denies questions/ concerns about current medications -- continues self-managing medications -- Patient was recently discharged from hospital and all medications were thoroughly reviewed with patient today: no concerns/ issues identified as a result of today's medication review with  patient  -- All recent / upcoming provider appointments were reviewed with patient today; patient verbalizes accurate understanding of upcoming appointments and verbalizes plans to continue driving self to appointments ---- Attended PCP 06/26/2019- states no changes made to medications; states she is considering recommended cardiac rehab ---- cardiology 07/01/2019-- verbalizes plans to discuss her recent episodes of increased activity intolerance and whether or not cardiology provider agrees that cardiac rehabilitation is adviseable  Self-health management of chronic disease state of CHF/ CAD/ COPD: -- again reports that she was playing golf up until about a month ago; has noticed over last few weeks that she gets tired easier than usual with activity -- still not performing daily weight monitoring/ recording, but continues to monitor weights at home 3-4 times weekly; reports weights are stable "stay around 130 lbs."  Reports weight at PCP office visit yesterday 128 lbs, "right in my usual range" -- basics of value of daily/ weekly weight monitoring discussed with patient, along with pathophysiology of heart failure/ fluid retention, which may lead to the symptoms she described; encouraged patient to consider daily weight monitoring/ recording at home.  Weight gain guidelines in setting of CHF with corresponding action plan reiterated; provided education around weight gain being earliest indicator of fluid retention -- initiated education around signs/ symptoms CHF yellow zone with patient along with corresponding action plan for same  Patient denies further issues, concerns, or problems today, and states that she needs to go, as her grandson has stopped by "for a surprise visit." I confirmed that patient hasmy direct phone number, the main [facility] CM office phone number, and the [facility] CM 24-hour nurse advice phone number should issues arise prior to next scheduled [facility] [employer].  Encouraged  patient to contact me directly if needs, questions, issues, or concerns arise prior to next scheduled outreach; patient agreed to do so.  Plan:  Patient will take medications as prescribed and will attend all scheduled provider appointments  Patient will promptly notify care providers for any new concerns/ issues/ problems that arise  Patient will continue monitoring/ and begin recording daily weights at home 3-4 times per week  [place] outreach to continue with scheduled phone call in 3 weeks to complete [facility] CM initial assessment  [facility] CM Care Plan Problem One     Most Recent Value  Care Plan Problem One  High Risk for hospital readmission related to/ as evidenced by recent hospital visit for chest pain September 28-29, 2020  Role Documenting the Problem One  Care Management Coordinator  Care Plan for Problem One  Active  [facility] Long Term Goal   Over the next 31 days, patient will not experience hospital readmission, as evidenced by patient reporting and review of EMR during [facility] RN CM outreach  [facility] Long Term Goal Start Date  06/21/19  Interventions for Problem One Long Term Goal  Discussed current clinical condition with  patient and confirmed that she does not have current clinical concerns and reports feeling better after last hospital discharge,  reviewed current medications with patient and confirmed that she is taking and has no current concerns aorund medications,  reiterated role of [facility] CM services/ involvement in her care  [facility] CM Short Term Goal #1   Over the next 30 days, patient will begin to record her weights monitored at home 3-4 times per week, as evidenced by patient reporting during [facility] RN CM outreach  [facility] CM Short Term Goal #1 Start Date  06/27/19  Interventions for Short Term Goal #1  using teach back method, provided verbal education around value of monitoring and recording weights at home,  discussed basic pathophysiology of heart failure with patient in layman's  terms and initiated education around CHF zones,  discussed with patient weight gain guidelines in setting of CHF and provided printed educational material to patient  [facility] CM Short Term Goal #2   Over the next 30 days, patient will attend all scheduled provider appointments, as evidenced by patient reporting and review of EMR during [facility] RN CM outreach  [facility] CM Short Term Goal #2 Start Date  06/27/19  Interventions for Short Term Goal #2  Reviewed yesterday's office visit with PCP and upcoming provider appointments with patient,  discussed value of discussing recommended cardiac rehabilitation with cardiology provider at next week's appointment,  confirmed that patient continues to transport self to all provider appointments and continues to deny transportation issues     Oneta Rack, RN, BSN, [employer] Coordinator [facility] Care Management  [phone]

## 2019-06-30 NOTE — Progress Notes (Signed)
Cardiology Office Note  Date:  07/01/2019   ID:  [name], DOB 1926-07-11, MRN [id number]  PCP:  [name], MD   Chief Complaint  Patient presents with  . other    Patient would like to discuss her ER visit; chest pain. Meds reviewed by the pt. verbally. Pt. c/o shortness of breath.     HPI:  [name] is a pleasant 85 year old woman with a history of  CAD, bypass surgery in 1999,Stress test October 2017 with no ischemia COPD, Smoking for 40 years who quit in 1985,  chronic renal insufficiency,  hypertension,  hyperlipidemia,  40-50% bilateral carotid arterial disease  EF >60% in 05/2015  C. difficile in January 2015. H/o fall wet floor  02/2017, Suffered a pelvic fracture, left shoulder pain who presents for routine followup of her coronary artery disease  She reports having dermatologic procedure approximately 2 weeks ago Skin cancer taken off and provided cream Was told to apply this size of her hand on her left shin She did so but had to stop taking the cream after 3 days given extensive reaction, burn -Profound erythema redness blistering, could not walk Started having chills fever, insomnia  Also reports having some shortness of breath on exertion, chronic issue Is not taking her Combivent, expired  Denies having significant chest pain, tolerating higher dose Imdur  Prior issues with insomnia, was not taking Ativan Some anxiety  Stress test 02/2019 Pharmacological myocardial perfusion imaging study with no significant ischemia Unable to exclude mild ischemia lateral wall on non-attenuation corrected images Normal wall motion, EF estimated at 56% EKG with equivocal ST abnormality V2 through V5 following infusion of lexiscan.  Patient did report chest pain following infusion. Rare PVC noted Moderate risk scan , secondary to equivocal EKG changes, chest pain during the study  EKG personally reviewed by myself on todays visit Shows normal sinus  rhythm with rate 63 bpm ST wave abnormality precordial leads  Previously treated with chemotherapy for cancer on right lower extremity  Other past medical history reviewed Previous stress test April 2019 reviewed with her showing no significant ischemia  Hospital admission 12/21/2016 for COPD exacerbation Syncope, dehydrated She received steroids,duo nebs, ABX   PMH:   has a past medical history of C. difficile colitis, CAD (coronary artery disease), Carotid artery disease ([place]), Closed fracture pubis ([place]) (07/04/2017), COPD (chronic obstructive pulmonary disease) ([place]), Diffuse cystic mastopathy, Diverticulitis, Dizziness, Gait abnormality, GERD (gastroesophageal reflux disease), History of migraines, CABG, Hyperlipidemia, Hypertension, Indigestion, Kidney stones, Rheumatic fever, Sinus bradycardia, SOB (shortness of breath), and Syncopal episodes.  PSH:    Past Surgical History:  Procedure Laterality Date  . APPENDECTOMY  1950  . BREAST BIOPSY Right 1994  . BREAST CYST ASPIRATION     multiple BIL  . CATARACT EXTRACTION Right 2009  . CATARACT EXTRACTION Left 2011  . COLONOSCOPY  [id number]   Dr. Jamal Collin  . CORONARY ARTERY BYPASS GRAFT  1999  . esophagus stretched    . TONSILLECTOMY  1939  . TOTAL ABDOMINAL HYSTERECTOMY  1968   due to metrorrhagia    Current Outpatient Medications  Medication Sig Dispense Refill  . amLODipine (NORVASC) 2.5 MG tablet TAKE 1 TABLET BY MOUTH DAILY 90 tablet 1  . clopidogrel (PLAVIX) 75 MG tablet TAKE ONE TABLET EVERY DAY 90 tablet 2  . escitalopram (LEXAPRO) 10 MG tablet Take 10 mg by mouth daily.    Marland Kitchen esomeprazole (NEXIUM) 40 MG capsule TAKE 1 CAPSULE DAILY BEFORE BREAKFAST 90 capsule  1  . isosorbide mononitrate (IMDUR) 60 MG 24 hr tablet Take 1 tablet (60 mg total) by mouth 2 (two) times daily. . 180 tablet 1  . LORazepam (ATIVAN) 0.5 MG tablet TAKE 1.5 TABLETS BY MOUTH AS NEEDED FOR INSOMNIA 45 tablet 4  . losartan (COZAAR) 100 MG tablet TAKE ONE  TABLET EVERY DAY 90 tablet 2  . metoprolol succinate (TOPROL-XL) 25 MG 24 hr tablet Take 1 tablet (25 mg total) by mouth daily. Take with or immediately following a meal. 90 tablet 3  . nitroGLYCERIN (NITROSTAT) 0.4 MG SL tablet PLACE 1 TABLET UNDER TONGUE EVERY 5 MIN AS NEEDED FOR CHEST PAIN IF NO RELIEF IN15 MIN CALL 911 (MAX 3 TABS) 25 tablet 1  . simvastatin (ZOCOR) 40 MG tablet TAKE 1 TABLET BY MOUTH AT BEDTIME 90 tablet 0  . Ipratropium-Albuterol (COMBIVENT RESPIMAT) 20-100 MCG/ACT AERS respimat Inhale 1 puff into the lungs every 6 (six) hours as needed for wheezing. (Patient not taking: Reported on 07/01/2019) 4 g 5   No current facility-administered medications for this visit.      Allergies:   Codeine   Social History:  The patient  reports that she quit smoking about 35 years ago. Her smoking use included cigarettes. She has a 20.00 pack-year smoking history. She has never used smokeless tobacco. She reports previous alcohol use. She reports that she does not use drugs.   Family History:   family history includes Heart disease in her father and mother.    Review of Systems: Review of Systems  Constitutional: Negative.   Respiratory: Positive for shortness of breath.   Cardiovascular: Negative.   Gastrointestinal: Negative.   Skin:       Burning across lower extremity shin  Psychiatric/Behavioral: The patient has insomnia.   All other systems reviewed and are negative.    PHYSICAL EXAM: VS:  BP (!) 144/60 (BP Location: Left Arm, Patient Position: Sitting, Cuff Size: Normal)   Pulse 63   Temp 98.2 F (36.8 C)   Ht 5\' 3"  (1.6 m)   Wt 126 lb (57.2 kg)   BMI 22.32 kg/m  , BMI Body mass index is 22.32 kg/m. Constitutional:  oriented to person, place, and time. No distress.  HENT:  Head: Grossly normal Eyes:  no discharge. No scleral icterus.  Neck: No JVD, no carotid bruits  Cardiovascular: Regular rate and rhythm, no murmurs appreciated Pulmonary/Chest: Clear to  auscultation bilaterally, no wheezes or rails Abdominal: Soft.  no distension.  no tenderness.  Musculoskeletal: Normal range of motion Neurological:  normal muscle tone. Coordination normal. No atrophy Skin: Skin warm and dry Lower extremity with extensive burns, blistering, erythema across the shin Psychiatric: normal affect, pleasant  Recent Labs: 06/18/2019: ALT 13; BUN 20; Creatinine, Ser 1.22; Hemoglobin 12.5; Platelets 154; Potassium 4.4; Sodium 141    Lipid Panel Lab Results  Component Value Date   CHOL 120 04/02/2019   HDL 35.90 (L) 04/02/2019   LDLCALC 57 04/02/2019   TRIG 135.0 04/02/2019      Wt Readings from Last 3 Encounters:  07/01/19 126 lb (57.2 kg)  06/26/19 128 lb 12.8 oz (58.4 kg)  06/18/19 130 lb (59 kg)     ASSESSMENT AND PLAN:  Essential hypertension -  Blood pressure is well controlled on today's visit. No changes made to the medications.  Coronary artery disease involving native coronary artery of native heart without angina pectoris -  Recent stress test no significant ischemia combivent for SOB Talked about cardiaxc cath  if symptoms get worse  History of bypass surgery Plan as above, surgery 2006 Chronic baseline shortness of breath, does not appear to be escalating Trying to maximize her anginal regiment Recommend she restart her therapy, walking  Carotid stenosis Less than 39% bilaterally, significant plaque noted Cholesterol at goal  Hyperlipidemia Cholesterol is at goal on the current lipid regimen. No changes to the medications were made. Continue Zocor  Skin cancer, leg Long discussion concerning her burn on the leg Possibly contributing to chills fever at nighttime and insomnia Suggested she try Tylenol in the day, Tylenol PM at night    Total encounter time more than 25 minutes  Greater than 50% was spent in counseling and coordination of care with the patient   Disposition:   F/U  6 months   No orders of the defined  types were placed in this encounter.    Signed, Esmond Plants, M.D., Ph.D. 07/01/2019  [place], [place]

## 2019-07-01 ENCOUNTER — Encounter: Payer: Self-pay | Admitting: Cardiovascular Disease

## 2019-07-01 ENCOUNTER — Other Ambulatory Visit: Payer: Self-pay

## 2019-07-01 ENCOUNTER — Ambulatory Visit (INDEPENDENT_AMBULATORY_CARE_PROVIDER_SITE_OTHER): Payer: Medicare Other | Admitting: Cardiovascular Disease

## 2019-07-01 VITALS — BP 144/60 | HR 63 | Temp 98.2°F | Ht 63.0 in | Wt 126.0 lb

## 2019-07-01 DIAGNOSIS — I25118 Atherosclerotic heart disease of native coronary artery with other forms of angina pectoris: Secondary | ICD-10-CM

## 2019-07-01 DIAGNOSIS — I700 Atherosclerosis of aorta: Secondary | ICD-10-CM

## 2019-07-01 DIAGNOSIS — I200 Unstable angina: Secondary | ICD-10-CM

## 2019-07-01 DIAGNOSIS — I6523 Occlusion and stenosis of bilateral carotid arteries: Secondary | ICD-10-CM

## 2019-07-01 DIAGNOSIS — I10 Essential (primary) hypertension: Secondary | ICD-10-CM | POA: Diagnosis not present

## 2019-07-01 DIAGNOSIS — N1831 Chronic kidney disease, stage 3a: Secondary | ICD-10-CM

## 2019-07-01 DIAGNOSIS — E782 Mixed hyperlipidemia: Secondary | ICD-10-CM

## 2019-07-01 DIAGNOSIS — Z951 Presence of aortocoronary bypass graft: Secondary | ICD-10-CM | POA: Diagnosis not present

## 2019-07-01 MED ORDER — COMBIVENT RESPIMAT 20-100 MCG/ACT IN AERS: 1.0000 | INHALATION_SPRAY | Freq: Four times a day (QID) | RESPIRATORY_TRACT | 2 refills | Status: DC | PRN

## 2019-07-01 NOTE — Patient Instructions (Addendum)
Medication Instructions:  - Your physician has recommended you make the following change in your medication:   1) Combivent inhaler - inhale 1 puff into the lungs every 6 hours as needed (has been refilled)  2) Use Tylenol as needed during the day for chills fever from leg (follow instructions on the label for use)  3) Use Tylenol PM at night as needed for sleep (follow instructions on the label for use)  If you need a refill on your cardiac medications before your next appointment, please call your pharmacy.    - Call Dermatology regarding your leg, they may want to see you to follow up  Lab work:  No new labs needed   If you have labs (blood work) drawn today and your tests are completely normal, you will receive your results only by: Marland Kitchen MyChart Message (if you have MyChart) OR . A paper copy in the mail If you have any lab test that is abnormal or we need to change your treatment, we will call you to review the results.   Testing/Procedures: No new testing needed   Follow-Up: At [facility], you and your health needs are our priority.  As part of our continuing mission to provide you with exceptional heart care, we have created designated Provider Care Teams.  These Care Teams include your primary Cardiologist (physician) and Advanced Practice Providers (APPs -  Physician Assistants and Nurse Practitioners) who all work together to provide you with the care you need, when you need it.  . You will need a follow up appointment in 6 months (April 2021)  .   Please call our office 2 months in advance to schedule this appointment.  (Call in early February 2021 to schedule)  . Providers on your designated Care Team:   . Murray Hodgkins, NP . Christell Faith, PA-C . Marrianne Mood, PA-C  Any Other Special Instructions Will Be Listed Below (If Applicable).  For educational health videos Log in to : [email or web address] Or : [email or web address], password : [email or web address]

## 2019-07-03 ENCOUNTER — Telehealth: Payer: Self-pay

## 2019-07-03 NOTE — Telephone Encounter (Signed)
Copied from [place] [id number]. Topic: General - Other >> Jul 03, 2019 10:14 AM Jodie Echevaria wrote: Reason for CRM: Patient called to inform Dr Derrel Nip that since her visit a week ago she have been having night sweats and weakness in her body. She is concerned and is asking for a call back from Dr Derrel Nip. Ph#  [phone]

## 2019-07-03 NOTE — Telephone Encounter (Signed)
Called and spoke to patient.  Patient c/o of having night sweats and feeling weak.  Patient said that she "passed out" one night about a month ago.  Patient said that she didn't think to tell Dr. Derrel Nip.  Patient denied falling.  Patient said that she went to fix her friend a drink but then went to the couch and fell out but doesn't remember and then awoke.  Patient said that her friend was with her at the time who called pt's daughter out of concern.  Patient said that she had not been drinking any alcohol. Patient that when she woke up her daughter and daughter's husband was there.  Pt said that she was fine when she woke up and didn't follow up with a doctor.  Patient said that she is also having night sweats once a night which requires her to change her gown.  Patient said that she is feeling fatigue and this is not normal for her.  Patient said that fatigue has affected her to the point where she is no longer able to play golf and work in her yard.  Patient said that her breathing has worsen.  Patient has COPD.  Patient said that her cardiologist recently  prescribed Combivent but pt hasn't started using it yet, but has picked up Rx on yesterday.  Pt said that she plans on starting use of Combivent today.  Patient said that she has previously used this medication before.  Patient said that she saw cardiologist on 07/01/19.  Patient said that she told cardiologist that she has SOB but forgot to mention the fatigue.  Patient was scheduled a virtual visit with PCP-Dr. Derrel Nip on 07/08/19 @ 4:00 pm.  Patient advised to go to ED or UC if breathing or SOB worsens.

## 2019-07-05 ENCOUNTER — Encounter: Payer: Self-pay | Admitting: Internal Medicine

## 2019-07-05 ENCOUNTER — Telehealth: Payer: Self-pay

## 2019-07-05 ENCOUNTER — Ambulatory Visit (INDEPENDENT_AMBULATORY_CARE_PROVIDER_SITE_OTHER): Payer: Medicare Other | Admitting: Internal Medicine

## 2019-07-05 ENCOUNTER — Other Ambulatory Visit: Payer: Self-pay

## 2019-07-05 DIAGNOSIS — R630 Anorexia: Secondary | ICD-10-CM | POA: Diagnosis not present

## 2019-07-05 DIAGNOSIS — R531 Weakness: Secondary | ICD-10-CM

## 2019-07-05 DIAGNOSIS — L03116 Cellulitis of left lower limb: Secondary | ICD-10-CM

## 2019-07-05 MED ORDER — ESCITALOPRAM OXALATE 10 MG PO TABS: ORAL_TABLET | ORAL | 2 refills | Status: DC

## 2019-07-05 MED ORDER — CEPHALEXIN 500 MG PO CAPS: 500.0000 mg | ORAL_CAPSULE | Freq: Four times a day (QID) | ORAL | 0 refills | Status: DC

## 2019-07-05 NOTE — Progress Notes (Signed)
Patient c/o weakness, hot flashes during night, loss of appetite.

## 2019-07-05 NOTE — Progress Notes (Signed)
Telephone  Note  This visit type was conducted due to national recommendations for restrictions regarding the COVID-19 pandemic (e.g. social distancing).  This format is felt to be most appropriate for this patient at this time.  All issues noted in this document were discussed and addressed.  No physical exam was performed (except for noted visual exam findings with Video Visits).   I connected with@ on 07/05/19 at  3:00 PM EDT bytelephone and verified that I am speaking  with the correct person using two identifiers. Location patient: home Location provider: work or home office Persons participating in the virtual visit: patient, provider  I discussed the limitations, risks, security and privacy concerns of performing an evaluation and management service by telephone and the availability of in person appointments. I also discussed with the patient that there may be a patient responsible charge related to this service. The patient expressed understanding and agreed to proceed.  Reason for visit: generalized weakness,  Anorexia ,  Night sweats   HPI:  85 yr old with COPD, CAD with recent admission for chest pain , seen one week ago face to face for hospital follow up, presents with new onset  Weakness, anorexia with weight loss of several lbs, and recurrent night sweats for the past week. Denies fevers,  Chest pain, shortness of breath.   During hospital follow up one week ago  was having a severe reaction to topical chemotherapeutic prescribed by dermatology to left lower leg for treatment of skin cancers.  .  Since then she has stopped th topical treatment and leg has improved somwewhat but still swollen and red .  The leg is warm to the touch per Roiza's daughter who is present during the phone interview.    ROS: See pertinent positives and negatives per HPI.  Past Medical History:  Diagnosis Date  . C. difficile colitis   . CAD (coronary artery disease)    a. [facility] 10/11, not gated,  no scar or ischemia, EF 68%, low risk study; b. [place] 06/18/14: no evidence of infarction or ischemia, no WMA, normalization of TWI in precordial leads, equivocal EKG changes EF 77%, low risk study  . Carotid artery disease ([place])    Doppler January, 2012, 40-59% bilateral, followup one year  . Closed fracture pubis ([place]) 07/04/2017  . COPD (chronic obstructive pulmonary disease) ([place])   . Diffuse cystic mastopathy   . Diverticulitis    last colonoscopy  incomplete Jan 2011  . Dizziness    stable now (Oct 2011) patient tells me question of TIA, we will obtain records  . Gait abnormality    Patient complains of "wobbly gait", December, 2013  . GERD (gastroesophageal reflux disease)   . History of migraines   . Hx of CABG    a. 3 vessel in 1999  . Hyperlipidemia   . Hypertension   . Indigestion    3 weeks, October 2011  . Kidney stones   . Rheumatic fever   . Sinus bradycardia    Mild, December, 2013  . SOB (shortness of breath)   . Syncopal episodes    after 18 holes of golf and increase hydrochlorothiazide, resolved     Past Surgical History:  Procedure Laterality Date  . APPENDECTOMY  1950  . BREAST BIOPSY Right 1994  . BREAST CYST ASPIRATION     multiple BIL  . CATARACT EXTRACTION Right 2009  . CATARACT EXTRACTION Left 2011  . COLONOSCOPY  [id number]   Dr. Jamal Collin  .  CORONARY ARTERY BYPASS GRAFT  1999  . esophagus stretched    . TONSILLECTOMY  1939  . TOTAL ABDOMINAL HYSTERECTOMY  1968   due to metrorrhagia    Family History  Problem Relation Age of Onset  . Heart disease Mother   . Heart disease Father   . Cancer Neg Hx   . Drug abuse Neg Hx   . Breast cancer Neg Hx     SOCIAL HX:  reports that she quit smoking about 35 years ago. Her smoking use included cigarettes. She has a 20.00 pack-year smoking history. She has never used smokeless tobacco. She reports previous alcohol use. She reports that she does not use drugs.   Current Outpatient  Medications:  .  amLODipine (NORVASC) 2.5 MG tablet, TAKE 1 TABLET BY MOUTH DAILY, Disp: 90 tablet, Rfl: 1 .  clopidogrel (PLAVIX) 75 MG tablet, TAKE ONE TABLET EVERY DAY, Disp: 90 tablet, Rfl: 2 .  escitalopram (LEXAPRO) 10 MG tablet, One tablet by mouth daily after dinner, Disp: 30 tablet, Rfl: 2 .  esomeprazole (NEXIUM) 40 MG capsule, TAKE 1 CAPSULE DAILY BEFORE BREAKFAST, Disp: 90 capsule, Rfl: 1 .  Ipratropium-Albuterol (COMBIVENT RESPIMAT) 20-100 MCG/ACT AERS respimat, Inhale 1 puff into the lungs every 6 (six) hours as needed for wheezing., Disp: 4 g, Rfl: 2 .  isosorbide mononitrate (IMDUR) 60 MG 24 hr tablet, Take 1 tablet (60 mg total) by mouth 2 (two) times daily. ., Disp: 180 tablet, Rfl: 1 .  LORazepam (ATIVAN) 0.5 MG tablet, TAKE 1.5 TABLETS BY MOUTH AS NEEDED FOR INSOMNIA, Disp: 45 tablet, Rfl: 4 .  losartan (COZAAR) 100 MG tablet, TAKE ONE TABLET EVERY DAY, Disp: 90 tablet, Rfl: 2 .  metoprolol succinate (TOPROL-XL) 25 MG 24 hr tablet, Take 1 tablet (25 mg total) by mouth daily. Take with or immediately following a meal., Disp: 90 tablet, Rfl: 3 .  nitroGLYCERIN (NITROSTAT) 0.4 MG SL tablet, PLACE 1 TABLET UNDER TONGUE EVERY 5 MIN AS NEEDED FOR CHEST PAIN IF NO RELIEF IN15 MIN CALL 911 (MAX 3 TABS), Disp: 25 tablet, Rfl: 1 .  simvastatin (ZOCOR) 40 MG tablet, TAKE 1 TABLET BY MOUTH AT BEDTIME, Disp: 90 tablet, Rfl: 0 .  cephALEXin (KEFLEX) 500 MG capsule, Take 1 capsule (500 mg total) by mouth 4 (four) times daily., Disp: 28 capsule, Rfl: 0  EXAM:  General appearance: alert, cooperative and articulate.  No signs of being in distress  Lungs: not short of breath ,  No cough, speaking in full sentences  Psych: affect normal,dspeech is articulate and non pressured .  Denies suicidal thoughts   ASSESSMENT AND PLAN:  Discussed the following assessment and plan:  Cellulitis of left lower leg  Cellulitis of left lower leg Presumed, given absence of other symptoms or etiologies of  her weakness anorexia and night sweats.  cephalexi 500 mg qid.  And advised to double her probiotic     I discussed the assessment and treatment plan with the patient. The patient was provided an opportunity to ask questions and all were answered. The patient agreed with the plan and demonstrated an understanding of the instructions.   The patient was advised to call back or seek an in-person evaluation if the symptoms worsen or if the condition fails to improve as anticipated.  I provided  25 minutes of non-face-to-face time during this encounter reviewing patient's current problems and post surgeries.  Providing counseling on the above mentioned problems , and coordination  of care .  Helene Kelp  Ether Griffins, MD

## 2019-07-06 DIAGNOSIS — L03116 Cellulitis of left lower limb: Secondary | ICD-10-CM | POA: Insufficient documentation

## 2019-07-06 NOTE — Assessment & Plan Note (Signed)
Presumed, given absence of other symptoms or etiologies of her weakness anorexia and night sweats.  cephalexi 500 mg qid.  And advised to double her probiotic

## 2019-07-08 ENCOUNTER — Emergency Department
Admission: EM | Admit: 2019-07-08 | Discharge: 2019-07-08 | Disposition: A | Discharge status: Home / Self Care | Payer: Medicare Other | Attending: Emergency Medicine | Admitting: Emergency Medicine

## 2019-07-08 ENCOUNTER — Other Ambulatory Visit: Payer: Self-pay

## 2019-07-08 ENCOUNTER — Telehealth: Payer: Self-pay

## 2019-07-08 ENCOUNTER — Ambulatory Visit: Payer: Medicare Other | Admitting: Internal Medicine

## 2019-07-08 DIAGNOSIS — I130 Hypertensive heart and chronic kidney disease with heart failure and stage 1 through stage 4 chronic kidney disease, or unspecified chronic kidney disease: Secondary | ICD-10-CM | POA: Insufficient documentation

## 2019-07-08 DIAGNOSIS — Z87891 Personal history of nicotine dependence: Secondary | ICD-10-CM | POA: Insufficient documentation

## 2019-07-08 DIAGNOSIS — J449 Chronic obstructive pulmonary disease, unspecified: Secondary | ICD-10-CM | POA: Diagnosis not present

## 2019-07-08 DIAGNOSIS — E860 Dehydration: Secondary | ICD-10-CM

## 2019-07-08 DIAGNOSIS — N183 Chronic kidney disease, stage 3 unspecified: Secondary | ICD-10-CM | POA: Diagnosis not present

## 2019-07-08 DIAGNOSIS — I5032 Chronic diastolic (congestive) heart failure: Secondary | ICD-10-CM | POA: Diagnosis not present

## 2019-07-08 DIAGNOSIS — I2510 Atherosclerotic heart disease of native coronary artery without angina pectoris: Secondary | ICD-10-CM | POA: Diagnosis not present

## 2019-07-08 DIAGNOSIS — R531 Weakness: Secondary | ICD-10-CM

## 2019-07-08 DIAGNOSIS — Z951 Presence of aortocoronary bypass graft: Secondary | ICD-10-CM | POA: Diagnosis not present

## 2019-07-08 DIAGNOSIS — I10 Essential (primary) hypertension: Secondary | ICD-10-CM | POA: Diagnosis not present

## 2019-07-08 LAB — CBC WITH DIFFERENTIAL/PLATELET
Abs Immature Granulocytes: 0.02 10*3/uL (ref 0.00–0.07)
Basophils Absolute: 0.1 10*3/uL (ref 0.0–0.1)
Basophils Relative: 1 %
Eosinophils Absolute: 0.1 10*3/uL (ref 0.0–0.5)
Eosinophils Relative: 1 %
HCT: 39.9 % (ref 36.0–46.0)
Hemoglobin: 13.5 g/dL (ref 12.0–15.0)
Immature Granulocytes: 0 %
Lymphocytes Relative: 20 %
Lymphs Abs: 1.7 10*3/uL (ref 0.7–4.0)
MCH: 32.5 pg (ref 26.0–34.0)
MCHC: 33.8 g/dL (ref 30.0–36.0)
MCV: 95.9 fL (ref 80.0–100.0)
Monocytes Absolute: 0.8 10*3/uL (ref 0.1–1.0)
Monocytes Relative: 10 %
Neutro Abs: 5.5 10*3/uL (ref 1.7–7.7)
Neutrophils Relative %: 68 %
Platelets: 187 10*3/uL (ref 150–400)
RBC: 4.16 MIL/uL (ref 3.87–5.11)
RDW: 11.9 % (ref 11.5–15.5)
WBC: 8.1 10*3/uL (ref 4.0–10.5)
nRBC: 0 % (ref 0.0–0.2)

## 2019-07-08 LAB — COMPREHENSIVE METABOLIC PANEL
ALT: 12 U/L (ref 0–44)
AST: 18 U/L (ref 15–41)
Albumin: 3.7 g/dL (ref 3.5–5.0)
Alkaline Phosphatase: 50 U/L (ref 38–126)
Anion gap: 10 (ref 5–15)
BUN: 19 mg/dL (ref 8–23)
CO2: 26 mmol/L (ref 22–32)
Calcium: 9 mg/dL (ref 8.9–10.3)
Chloride: 105 mmol/L (ref 98–111)
Creatinine, Ser: 1.19 mg/dL — ABNORMAL HIGH (ref 0.44–1.00)
GFR calc Af Amer: 47 mL/min — ABNORMAL LOW (ref >60)
GFR calc non Af Amer: 40 mL/min — ABNORMAL LOW (ref >60)
Glucose, Bld: 127 mg/dL — ABNORMAL HIGH (ref 70–99)
Potassium: 4.5 mmol/L (ref 3.5–5.1)
Sodium: 141 mmol/L (ref 135–145)
Total Bilirubin: 0.7 mg/dL (ref 0.3–1.2)
Total Protein: 6.2 g/dL — ABNORMAL LOW (ref 6.5–8.1)

## 2019-07-08 LAB — URINALYSIS, COMPLETE (UACMP) WITH MICROSCOPIC
Bacteria, UA: NONE SEEN
Bilirubin Urine: NEGATIVE
Glucose, UA: NEGATIVE mg/dL
Hgb urine dipstick: NEGATIVE
Ketones, ur: NEGATIVE mg/dL
Nitrite: NEGATIVE
Protein, ur: 30 mg/dL — AB
Specific Gravity, Urine: 1.008 (ref 1.005–1.030)
pH: 6 (ref 5.0–8.0)

## 2019-07-08 LAB — TROPONIN I (HIGH SENSITIVITY): Troponin I (High Sensitivity): 6 ng/L (ref <18)

## 2019-07-08 MED ORDER — SODIUM CHLORIDE 0.9 % IV SOLN
1000.0000 mL | Freq: Once | INTRAVENOUS | Status: AC
  Administered 2019-07-08: 1000 mL via INTRAVENOUS

## 2019-07-08 NOTE — ED Provider Notes (Addendum)
[facility] Emergency Department Provider Note       Time seen: ----------------------------------------- 12:33 PM on 07/08/2019 -----------------------------------------   I have reviewed the triage vital signs and the nursing notes.  HISTORY   Chief Complaint No chief complaint on file.    HPI [name] is a 85 y.o. female with a history of C. difficile colitis, coronary disease, COPD, GERD, kidney stones, shortness of breath who presents to the ED for weakness for the last week.  Patient was started on antibiotics for infection on her leg on Friday and had a topical chemotherapy treatment on that leg prior to that.  She is feels like she may be dehydrated, has had nausea but no diarrhea.  She denies fevers, chills or other complaints.  Past Medical History:  Diagnosis Date  . C. difficile colitis   . CAD (coronary artery disease)    a. Nuclear 10/11, not gated, no scar or ischemia, EF 68%, low risk study; b. [place] 06/18/14: no evidence of infarction or ischemia, no WMA, normalization of TWI in precordial leads, equivocal EKG changes EF 77%, low risk study  . Carotid artery disease ([place])    Doppler January, 2012, 40-59% bilateral, followup one year  . Closed fracture pubis ([place]) 07/04/2017  . COPD (chronic obstructive pulmonary disease) ([place])   . Diffuse cystic mastopathy   . Diverticulitis    last colonoscopy  incomplete Jan 2011  . Dizziness    stable now (Oct 2011) patient tells me question of TIA, we will obtain records  . Gait abnormality    Patient complains of "wobbly gait", December, 2013  . GERD (gastroesophageal reflux disease)   . History of migraines   . Hx of CABG    a. 3 vessel in 1999  . Hyperlipidemia   . Hypertension   . Indigestion    3 weeks, October 2011  . Kidney stones   . Rheumatic fever   . Sinus bradycardia    Mild, December, 2013  . SOB (shortness of breath)   . Syncopal episodes    after 18 holes  of golf and increase hydrochlorothiazide, resolved     Patient Active Problem List   Diagnosis Date Noted  . Cellulitis of left lower leg 07/06/2019  . Chest pain with high risk of acute coronary syndrome 06/18/2019  . Mild malnutrition ([place]) 03/25/2019  . Diastolic CHF, chronic ([place]) 03/25/2019  . Vitamin D deficiency 03/25/2019  . Melena 02/03/2018  . Hearing loss secondary to cerumen impaction 12/26/2017  . S/P excision of skin lesion, follow-up exam 12/26/2017  . Impingement syndrome of left shoulder region 07/04/2017  . Neck pain 07/04/2017  . Allergic rhinitis 12/31/2016  . Hospital discharge follow-up 12/31/2016  . Change in bowel movement 08/20/2016  . SCC (squamous cell carcinoma), face 08/01/2016  . EKG abnormalities 06/17/2016  . Osteoporosis of forearm 02/13/2016  . Dizziness and giddiness 02/13/2016  . Other specified symptoms and signs involving the circulatory and respiratory systems 01/31/2016  . Insomnia secondary to anxiety 07/12/2015  . Anxiety disorder 07/12/2015  . Essential (primary) hypertension 10/07/2014  . Syncope and collapse   . Sinus bradycardia   . Hardening of the aorta (main artery of the heart) ([place]) 06/26/2014  . History of Clostridium difficile colitis 10/20/2013  . Personal history of other diseases of the digestive system 10/20/2013  . Low back pain 10/04/2013  . Encounter for general adult medical examination without abnormal findings 02/07/2013  . Chest pain at rest  12/26/2011  . COPD, moderate ([place]) 12/05/2011  . Presence of aortocoronary bypass graft   . Chronic kidney disease, stage III (moderate) 09/25/2011  . Hyperlipidemia   . Hypertension   . SOB (shortness of breath)   . Coronary artery disease of native artery of native heart with stable angina pectoris ([place])   . Carotid artery disease [facility])     Past Surgical History:  Procedure Laterality Date  . APPENDECTOMY  1950  . BREAST BIOPSY Right 1994  . BREAST CYST ASPIRATION      multiple BIL  . CATARACT EXTRACTION Right 2009  . CATARACT EXTRACTION Left 2011  . COLONOSCOPY  7782,4235   Dr. Jamal Collin  . CORONARY ARTERY BYPASS GRAFT  1999  . esophagus stretched    . TONSILLECTOMY  1939  . TOTAL ABDOMINAL HYSTERECTOMY  1968   due to metrorrhagia    Allergies Codeine  Social History Social History   Tobacco Use  . Smoking status: Former Smoker    Packs/day: 0.50    Years: 40.00    Pack years: 20.00    Types: Cigarettes    Quit date: 09/20/1983    Years since quitting: 35.8  . Smokeless tobacco: Never Used  Substance Use Topics  . Alcohol use: Not Currently    Comment: yes, social wine  . Drug use: No   Review of Systems Constitutional: Negative for fever. Cardiovascular: Negative for chest pain. Respiratory: Negative for shortness of breath. Gastrointestinal: Negative for abdominal pain, vomiting and diarrhea. Musculoskeletal: Negative for back pain. Skin: Negative for rash. Neurological: Positive for generalized weakness  All systems negative/normal/unremarkable except as stated in the HPI  ____________________________________________   PHYSICAL EXAM:  VITAL SIGNS: ED Triage Vitals  Enc Vitals Group     BP      Pulse      Resp      Temp      Temp src      SpO2      Weight      Height      Head Circumference      Peak Flow      Pain Score      Pain Loc      Pain Edu?      Excl. in [place]?    Constitutional: Alert and oriented. Well appearing and in no distress. Eyes: Conjunctivae are normal. Normal extraocular movements. ENT      Head: Normocephalic and atraumatic.      Nose: No congestion/rhinnorhea.      Mouth/Throat: Mucous membranes are moist.      Neck: No stridor. Cardiovascular: Normal rate, regular rhythm. No murmurs, rubs, or gallops. Respiratory: Normal respiratory effort without tachypnea nor retractions. Breath sounds are clear and equal bilaterally. No wheezes/rales/rhonchi. Gastrointestinal: Soft and nontender.  Normal bowel sounds Musculoskeletal: Nontender with normal range of motion in extremities. No lower extremity tenderness nor edema. Neurologic:  Normal speech and language. No gross focal neurologic deficits are appreciated.  Skin:  Skin is warm, dry and intact. No rash noted. Psychiatric: Mood and affect are normal. Speech and behavior are normal.   EKG: Sinus bradycardia with a rate of 46 bpm, low voltage, T wave inversions, long QT ____________________________________________  ED COURSE:  As part of my medical decision making, I reviewed the following data within the [place] History obtained from family if available, nursing notes, old chart and ekg, as well as notes from prior ED visits. Patient presented for weakness, we will assess with  labs and imaging as indicated at this time.   Procedures  [name] was evaluated in Emergency Department on 07/08/2019 for the symptoms described in the history of present illness. She was evaluated in the context of the global COVID-19 pandemic, which necessitated consideration that the patient might be at risk for infection with the SARS-CoV-2 virus that causes COVID-19. Institutional protocols and algorithms that pertain to the evaluation of patients at risk for COVID-19 are in a state of rapid change based on information released by regulatory bodies including the CDC and federal and state organizations. These policies and algorithms were followed during the patient's care in the ED.  ____________________________________________   LABS (pertinent positives/negatives)  Labs Reviewed  COMPREHENSIVE METABOLIC PANEL - Abnormal; Notable for the following components:      Result Value   Glucose, Bld 127 (*)    Creatinine, Ser 1.19 (*)    Total Protein 6.2 (*)    GFR calc non Af Amer 40 (*)    GFR calc Af Amer 47 (*)    All other components within normal limits  CBC WITH DIFFERENTIAL/PLATELET  URINALYSIS, COMPLETE (UACMP)  WITH MICROSCOPIC  CBG MONITORING, ED  TROPONIN I (HIGH SENSITIVITY)   ____________________________________________   DIFFERENTIAL DIAGNOSIS   Dehydration, electrolyte abnormality, occult infection  FINAL ASSESSMENT AND PLAN  Weakness, dehydration   Plan: The patient had presented for weakness and possible dehydration. Patient's labs perhaps suggestive of dehydration without other acute process identified.  She has been advised to hold antibiotics and Lexapro as this may be worsening her symptoms.  EKG is unchanged from prior.  She is cleared for outpatient follow-up.   Laurence Aly, MD    Note: This note was generated in part or whole with voice recognition software. Voice recognition is usually quite accurate but there are transcription errors that can and very often do occur. I apologize for any typographical errors that were not detected and corrected.     Earleen Newport, MD 07/08/19 [id number]    Earleen Newport, MD 07/08/19 [phone]

## 2019-07-08 NOTE — Telephone Encounter (Signed)
Copied from [place] [id number]. Topic: General - Other >> Jul 08, 2019 10:29 AM Berneta Levins wrote: Reason for CRM:   Pt's daughter, [name], calling.  States that she needs to speak with nurse regarding visit with Dr. Derrel Nip last week.  Daughter didn't want to give me any more information. [name] can be reached at [phone]

## 2019-07-08 NOTE — ED Notes (Signed)
Pt up to use bedside toilet. Urinating.

## 2019-07-08 NOTE — ED Triage Notes (Signed)
Pt arrived via ems from home for c/oi weakness x1 week - pt started atb for infection on leg Friday and had a topical chemo treatment on leg

## 2019-07-08 NOTE — ED Notes (Signed)
Pt resting comfortably in bed. Family remains at bedside.

## 2019-07-08 NOTE — ED Notes (Signed)
Pt given snack and drink with EDP Monks verbal okay. EDP Monks declines 2nd troponin. Pt given another warm blanket.

## 2019-07-08 NOTE — ED Notes (Signed)
EKG completed. Pt denies CP/SOB.

## 2019-07-08 NOTE — ED Notes (Signed)
This RN asked EDP Jimmye Norman if he wants a 2nd trop. Awaiting orders.

## 2019-07-09 ENCOUNTER — Telehealth: Payer: Self-pay | Admitting: *Deleted

## 2019-07-09 ENCOUNTER — Other Ambulatory Visit: Payer: Self-pay | Admitting: *Deleted

## 2019-07-09 NOTE — Patient Outreach (Signed)
[facility]) Care Management  07/09/2019  YOLANDRA HABIG March 13, 1926 [id number]   Telephone follow up call  Covering for assigned [place] Management  care coordinator ,Reginia Naas .  [name], 85 y/o female, routine referred to [place] by [place] 06/17/2019 for self health management of CHF.Patient has history including, but not limited to, CHF; COPD; HTN/ HLD; CAD; CKD- III. Noted from review of EMR that patient experienced post- referral observationhospitalization September 28-29, 2020 for chest pain; patient was ruled out for ACS/ MI and was discharged home to self-care without home health services in place.  07/08/19 Patient experienced [facility] ED visit due to complaint of weakness, possible dehydration.   Subjective  Successful outreach call to patient  introduced self explained reason for the call.  Patient discussed her recent visit to emergency room on yesterday for weakness and possible dehydration, she reports feeling better but not 100%. She discussed wanting to get back some her strength, she discussed being able to golf up until a month ago. She discussed having episode of sweat and hot flashes , no fever.  Patient denies dizziness today, she reports having a walker at her bedside for use at at night.  Patient reports that her appetite is improving , she was able to eat breakfast on today, she denies nausea or diarrhea. She report sipping on pedialyte , her daughter her bought her protein drink and she is trying to drink one a day. Patient reports that she is eating yogurt daily and has doubled up on her probiotic as PCP recommended , she spoke of her history of CDiff.   She further discussed :  Patient discussed recently having a reaction to topical cream she was using on her left leg for skin cancers  she has stopped cream, followed up with PCP and started recently on antibiotics 10/16.  Patient reports that area on her leg looks  much better. Patient discussed being unsure about continuing her antibiotic as one ED provider told her to hold off on antibiotic and lexapro as they may worsen her symptoms and the other told her to continue, she voiced being unsure what to do , but states she has  continued to take Keflex as prescribed previously prescribed, she voiced it would be good to know what Dr. Derrel Nip would recommended but she hates to bother her.  Patient also states that she has not been taking lexapro   Reviewed with patient recommended follow up with Dr.tullo after ED visit she voices understanding.   Patient denies any other concerns at this time.   Care Coordination  Placed call to Dr. Derrel Nip, spoke with representative Candace to discuss patient above  concern regarding Keflex, she states that she will give message to [place] for MD recommendations and they will follow up with patient.    Plan Reminded patient of follow up call from assigned care coordinator Pecola Lawless  in the next week.  Reviewed fall prevention measures, use of walker as needed  standing a few seconds upon arising before starting to walk.  Patient will schedule follow with PCP in the next week as recommended and advised to notify MD sooner if new concerns arise .    [facility] CM Care Plan Problem One     Most Recent Value  Care Plan Problem One  High Risk for hospital readmission related to/ as evidenced by recent hospital visit for chest pain September 28-29, 2020  Role Documenting the Problem One  Care Management Coordinator  Care Plan for Problem One  Active  [facility] Long Term Goal   Over the next 31 days, patient will not experience hospital readmission, as evidenced by patient reporting and review of EMR during [facility] RN CM outreach  [facility] Long Term Goal Start Date  06/21/19  [facility] CM Short Term Goal #1   Over the next 30 days, patient will begin to record her weights monitored at home 3-4 times per week, as evidenced by patient reporting during [facility] RN  CM outreach  [facility] CM Short Term Goal #1 Start Date  06/27/19  [facility] CM Short Term Goal #2   Over the next 30 days, patient will attend all scheduled provider appointments, as evidenced by patient reporting and review of EMR during [facility] RN CM outreach  [facility] CM Short Term Goal #2 Start Date  06/27/19      Joylene Draft, RN, [place] Management Coordinator  [phone]- Mobile [phone]- [place]

## 2019-07-09 NOTE — Telephone Encounter (Signed)
Copied from [place] [phone]. Topic: General - Other >> Jul 09, 2019  1:47 PM Sheran Luz wrote: Patient requesting call back from CMA to discuss medication list.

## 2019-07-10 NOTE — Telephone Encounter (Signed)
Patient's daughter Guy Franco, called back requesting to speak to [place].  Informed Ms. Harris that Janett Billow was not answering her extension and may have been at lunch or with a patient.  Offered to assist.  (Ok to speak to pt's daughter per [facility]).  Pt's daughter said that her mother was taken by ambulance on Monday to ED for weakness.  Pt's daughter said that she requested ambulance take pt since she felt it would be safer to mover her.  Pt's daughter said that daughter was given fluids through IV and had a chest x-ray.  Pt was told she was dehydrated and sent home.  Pt's daughter said that ED doctor advised pt to continue with the antibiotics that Dr. Derrel Nip had prescribed Friday for her leg but to discontinue Lexapro.  Pt stopped taking Lexapro after only taking it for 3 days.  Patient is still taking antibiotic.  Pt's daughter said that patient was doing better on yesterday but today complaining of feeling weak again.  Pt has been drinking PediaLyte, drinking protein shakes, water and eating better.  Pt's daughter wants to know if pt should have blood work to find out what may be causing patient's weakness.  Pt's daughter requested a face-to-face appt for pt.  Informed pt's daughter that Dr. Derrel Nip only has virtual appts left for the next couple of weeks.  Offered an appt with the NP but pt's daughter declined and wants to wait to see if Dr. Derrel Nip would open an appt or if pt even needs another face-to-face.  Pt's daughter said that pt was not aware that she was calling but would discuss with pt today.  Please advise.  Pt's daughter is requesting a call back from [place].  Best CB#:  [phone].

## 2019-07-10 NOTE — Telephone Encounter (Signed)
I can see her at 12:30 on Thursday (tomorrow). I have reviewed the ER doctor's  blood workand it was normal.  No anemia,  Kidney function was normal,  But her thyroid has not been checked and Her heart rate was slow , so I would recommend suspending the metoprolol for a few days to see if  It is the culprit and we will check her thyroid tomorrow Lab Results  Component Value Date   TSH 1.44 02/09/2015

## 2019-07-10 NOTE — Telephone Encounter (Signed)
Spoke to patient and pt's daughter, [name].  Pt advised per Dr. Lupita Dawn note.  Pt aware to suspend metoprolol for a few days.  Pt scheduled an in-person office visit for tomorrow (Thursday) @ 12:30 pm.  Patient was screened for COVID.  No symptoms, no exposure.

## 2019-07-11 ENCOUNTER — Encounter: Payer: Self-pay | Admitting: Internal Medicine

## 2019-07-11 ENCOUNTER — Other Ambulatory Visit: Payer: Self-pay

## 2019-07-11 ENCOUNTER — Ambulatory Visit (INDEPENDENT_AMBULATORY_CARE_PROVIDER_SITE_OTHER): Payer: Medicare Other | Admitting: Internal Medicine

## 2019-07-11 VITALS — BP 130/60 | HR 65 | Temp 96.6°F | Resp 17 | Ht 61.0 in | Wt 125.8 lb

## 2019-07-11 DIAGNOSIS — R531 Weakness: Secondary | ICD-10-CM

## 2019-07-11 DIAGNOSIS — I200 Unstable angina: Secondary | ICD-10-CM

## 2019-07-11 LAB — TSH: TSH: 2.08 u[IU]/mL (ref 0.35–4.50)

## 2019-07-11 NOTE — Progress Notes (Signed)
Subjective:  Patient ID: [name], female    DOB: 08-Feb-1926  Age: 85 y.o. MRN: [id number]  CC: The encounter diagnosis was Generalized weakness.  HPI AMENDA DUCLOS presents for evaluation of persistent weakness following an admission to [facility] in late September for unstable angina    Patient ruled out,  Chest pain was reproducible   Not short of breath .  Gets very weak after having hot flashes which have decreased in frequency  . From once per   Night  To maybe 3 or 4 per week .  The sweats started after the October 7 th visit  And have improved since taking the antibiotics for the cellulitis of the leg .   Has one more day left .  Was told by one ER doc to stop the ab and lexapro and by one to continue them .    UA was abnormal but no culture was done.    Outpatient Medications Prior to Visit  Medication Sig Dispense Refill  . amLODipine (NORVASC) 2.5 MG tablet TAKE 1 TABLET BY MOUTH DAILY 90 tablet 1  . cephALEXin (KEFLEX) 500 MG capsule Take 1 capsule (500 mg total) by mouth 4 (four) times daily. 28 capsule 0  . clopidogrel (PLAVIX) 75 MG tablet TAKE ONE TABLET EVERY DAY 90 tablet 2  . esomeprazole (NEXIUM) 40 MG capsule TAKE 1 CAPSULE DAILY BEFORE BREAKFAST 90 capsule 1  . Ipratropium-Albuterol (COMBIVENT RESPIMAT) 20-100 MCG/ACT AERS respimat Inhale 1 puff into the lungs every 6 (six) hours as needed for wheezing. 4 g 2  . isosorbide mononitrate (IMDUR) 60 MG 24 hr tablet Take 1 tablet (60 mg total) by mouth 2 (two) times daily. . 180 tablet 1  . LORazepam (ATIVAN) 0.5 MG tablet TAKE 1.5 TABLETS BY MOUTH AS NEEDED FOR INSOMNIA 45 tablet 4  . losartan (COZAAR) 100 MG tablet TAKE ONE TABLET EVERY DAY 90 tablet 2  . nitroGLYCERIN (NITROSTAT) 0.4 MG SL tablet PLACE 1 TABLET UNDER TONGUE EVERY 5 MIN AS NEEDED FOR CHEST PAIN IF NO RELIEF IN15 MIN CALL 911 (MAX 3 TABS) 25 tablet 1  . simvastatin (ZOCOR) 40 MG tablet TAKE 1 TABLET BY MOUTH AT BEDTIME 90 tablet 0  . escitalopram  (LEXAPRO) 10 MG tablet One tablet by mouth daily after dinner (Patient not taking: Reported on 07/11/2019) 30 tablet 2  . metoprolol succinate (TOPROL-XL) 25 MG 24 hr tablet Take 1 tablet (25 mg total) by mouth daily. Take with or immediately following a meal. (Patient not taking: Reported on 07/11/2019) 90 tablet 3   No facility-administered medications prior to visit.     Review of Systems;  Patient denies headache, fevers, malaise, unintentional weight loss, skin rash, eye pain, sinus congestion and sinus pain, sore throat, dysphagia,  hemoptysis , cough, dyspnea, wheezing, chest pain, palpitations, orthopnea, edema, abdominal pain, nausea, melena, diarrhea, constipation, flank pain, dysuria, hematuria, urinary  Frequency, nocturia, numbness, tingling, seizures,  Focal weakness, Loss of consciousness,  Tremor, insomnia, depression, anxiety, and suicidal ideation.      Objective:  BP 130/60 (BP Location: Left Arm, Patient Position: Sitting, Cuff Size: Normal)   Pulse 65   Temp (!) 96.6 F (35.9 C) (Temporal)   Resp 17   Ht 5\' 1"  (1.549 m)   Wt 125 lb 12.8 oz (57.1 kg)   SpO2 96%   BMI 23.77 kg/m   BP Readings from Last 3 Encounters:  07/11/19 130/60  07/08/19 (!) 178/77  07/01/19 (!) 144/60  Wt Readings from Last 3 Encounters:  07/11/19 125 lb 12.8 oz (57.1 kg)  07/08/19 130 lb (59 kg)  07/05/19 126 lb (57.2 kg)    General appearance: alert, cooperative and appears stated age Ears: normal TM's and external ear canals both ears Throat: lips, mucosa, and tongue normal; teeth and gums normal Neck: no adenopathy, no carotid bruit, supple, symmetrical, trachea midline and thyroid not enlarged, symmetric, no tenderness/mass/nodules Back: symmetric, no curvature. ROM normal. No CVA tenderness. Lungs: clear to auscultation bilaterally Heart: regular rate and rhythm, S1, S2 normal, no murmur, click, rub or gallop Abdomen: soft, non-tender; bowel sounds normal; no masses,  no  organomegaly Pulses: 2+ and symmetric Skin: Skin color, texture, turgor normal. No rashes or lesions Lymph nodes: Cervical, supraclavicular, and axillary nodes normal.  Lab Results  Component Value Date   HGBA1C 5.8 01/30/2017   HGBA1C 5.8 09/26/2016    Lab Results  Component Value Date   CREATININE 1.19 (H) 07/08/2019   CREATININE 1.22 (H) 06/18/2019   CREATININE 1.31 (H) 06/18/2019    Lab Results  Component Value Date   WBC 8.1 07/08/2019   HGB 13.5 07/08/2019   HCT 39.9 07/08/2019   PLT 187 07/08/2019   GLUCOSE 127 (H) 07/08/2019   CHOL 120 04/02/2019   TRIG 135.0 04/02/2019   HDL 35.90 (L) 04/02/2019   LDLDIRECT 72.0 11/09/2015   LDLCALC 57 04/02/2019   ALT 12 07/08/2019   AST 18 07/08/2019   NA 141 07/08/2019   K 4.5 07/08/2019   CL 105 07/08/2019   CREATININE 1.19 (H) 07/08/2019   BUN 19 07/08/2019   CO2 26 07/08/2019   TSH 2.08 07/11/2019   INR 0.9 01/31/2018   HGBA1C 5.8 01/30/2017   MICROALBUR 2.5 (H) 12/26/2011    No results found.  Assessment & Plan:   Problem List Items Addressed This Visit      Unprioritized   Generalized weakness - Primary    All reversible causes ruled out.  Home PT recommended  Lab Results  Component Value Date   TSH 2.08 07/11/2019   Lab Results  Component Value Date   WBC 8.1 07/08/2019   HGB 13.5 07/08/2019   HCT 39.9 07/08/2019   MCV 95.9 07/08/2019   PLT 187 07/08/2019         Relevant Orders   TSH (Completed)   Urine Culture (Completed)   Ambulatory referral to Home Health    A total of 40 minutes was spent with patient more than half of which was spent in counseling patient on the above mentioned issues , reviewing and explaining recent labs and imaging studies done, and coordination of care.  I am having [name] maintain her esomeprazole, metoprolol succinate, nitroGLYCERIN, amLODipine, clopidogrel, losartan, LORazepam, isosorbide mononitrate, simvastatin, Combivent Respimat, cephALEXin,  and escitalopram.  No orders of the defined types were placed in this encounter.   There are no discontinued medications.  Follow-up: Return in about 2 weeks (around 07/25/2019).   [name], MD

## 2019-07-11 NOTE — Telephone Encounter (Signed)
See previous message

## 2019-07-11 NOTE — Telephone Encounter (Signed)
Spoke with pt's daughter and she stated that the pt has an appt with Dr. Derrel Nip today at 12:30 and they will discuss the medication concerns and weakness with her then.

## 2019-07-11 NOTE — Patient Instructions (Addendum)
Finish the antibiotics   Stop the tylenol PM  Use the lorazepam for insomnia   Starting on Sunday  night  Take the lexapro,  With or after  after dinner    FOLLOW UP 2 WEEKS

## 2019-07-12 LAB — URINE CULTURE
MICRO NUMBER:: 1018937
Result:: NO GROWTH
SPECIMEN QUALITY:: ADEQUATE

## 2019-07-13 DIAGNOSIS — E559 Vitamin D deficiency, unspecified: Secondary | ICD-10-CM | POA: Diagnosis not present

## 2019-07-13 DIAGNOSIS — M810 Age-related osteoporosis without current pathological fracture: Secondary | ICD-10-CM | POA: Diagnosis not present

## 2019-07-13 DIAGNOSIS — J449 Chronic obstructive pulmonary disease, unspecified: Secondary | ICD-10-CM | POA: Diagnosis not present

## 2019-07-13 DIAGNOSIS — I25110 Atherosclerotic heart disease of native coronary artery with unstable angina pectoris: Secondary | ICD-10-CM | POA: Diagnosis not present

## 2019-07-13 DIAGNOSIS — J309 Allergic rhinitis, unspecified: Secondary | ICD-10-CM | POA: Diagnosis not present

## 2019-07-13 DIAGNOSIS — F5105 Insomnia due to other mental disorder: Secondary | ICD-10-CM | POA: Diagnosis not present

## 2019-07-13 DIAGNOSIS — I130 Hypertensive heart and chronic kidney disease with heart failure and stage 1 through stage 4 chronic kidney disease, or unspecified chronic kidney disease: Secondary | ICD-10-CM | POA: Diagnosis not present

## 2019-07-13 DIAGNOSIS — E441 Mild protein-calorie malnutrition: Secondary | ICD-10-CM | POA: Diagnosis not present

## 2019-07-13 DIAGNOSIS — F419 Anxiety disorder, unspecified: Secondary | ICD-10-CM | POA: Diagnosis not present

## 2019-07-13 DIAGNOSIS — K921 Melena: Secondary | ICD-10-CM | POA: Diagnosis not present

## 2019-07-13 DIAGNOSIS — I5032 Chronic diastolic (congestive) heart failure: Secondary | ICD-10-CM | POA: Diagnosis not present

## 2019-07-13 DIAGNOSIS — Z951 Presence of aortocoronary bypass graft: Secondary | ICD-10-CM | POA: Diagnosis not present

## 2019-07-14 DIAGNOSIS — R531 Weakness: Secondary | ICD-10-CM | POA: Insufficient documentation

## 2019-07-14 NOTE — Assessment & Plan Note (Signed)
All reversible causes ruled out.  Home PT recommended  Lab Results  Component Value Date   TSH 2.08 07/11/2019   Lab Results  Component Value Date   WBC 8.1 07/08/2019   HGB 13.5 07/08/2019   HCT 39.9 07/08/2019   MCV 95.9 07/08/2019   PLT 187 07/08/2019

## 2019-07-16 ENCOUNTER — Other Ambulatory Visit: Payer: Self-pay | Admitting: *Deleted

## 2019-07-16 ENCOUNTER — Encounter: Payer: Self-pay | Admitting: *Deleted

## 2019-07-16 DIAGNOSIS — F419 Anxiety disorder, unspecified: Secondary | ICD-10-CM

## 2019-07-16 DIAGNOSIS — I25110 Atherosclerotic heart disease of native coronary artery with unstable angina pectoris: Secondary | ICD-10-CM

## 2019-07-16 DIAGNOSIS — Z951 Presence of aortocoronary bypass graft: Secondary | ICD-10-CM

## 2019-07-16 DIAGNOSIS — E441 Mild protein-calorie malnutrition: Secondary | ICD-10-CM | POA: Diagnosis not present

## 2019-07-16 DIAGNOSIS — E559 Vitamin D deficiency, unspecified: Secondary | ICD-10-CM | POA: Diagnosis not present

## 2019-07-16 DIAGNOSIS — M810 Age-related osteoporosis without current pathological fracture: Secondary | ICD-10-CM

## 2019-07-16 DIAGNOSIS — I130 Hypertensive heart and chronic kidney disease with heart failure and stage 1 through stage 4 chronic kidney disease, or unspecified chronic kidney disease: Secondary | ICD-10-CM

## 2019-07-16 DIAGNOSIS — K921 Melena: Secondary | ICD-10-CM

## 2019-07-16 DIAGNOSIS — F5105 Insomnia due to other mental disorder: Secondary | ICD-10-CM | POA: Diagnosis not present

## 2019-07-16 DIAGNOSIS — J309 Allergic rhinitis, unspecified: Secondary | ICD-10-CM | POA: Diagnosis not present

## 2019-07-16 DIAGNOSIS — J449 Chronic obstructive pulmonary disease, unspecified: Secondary | ICD-10-CM | POA: Diagnosis not present

## 2019-07-16 DIAGNOSIS — I5032 Chronic diastolic (congestive) heart failure: Secondary | ICD-10-CM

## 2019-07-16 NOTE — Patient Outreach (Addendum)
[place] [facility]) [place] Telephone Outreach PCP completes Transition of Care follow up post-hospital discharge Post-hospital discharge day # 27 without hospital re-admission  07/16/2019  [name] [birth year] [id number]  Successful telephone outreach toBetty Allen, 85 y/o female, routine referred to [place] by [place] 06/17/2019 for self health management of CHF.Patient has history including, but not limited to, CHF; COPD; HTN/ HLD; CAD; CKD- III. Noted from review of EMR that patient experienced post- referral observationhospitalization September 28-29, 2020 for chest pain; patient was ruled out for ACS/ MI and was discharged home to self-care without home health services in place.  HIPAA/ identity verified with patient during phone call today; [facility] CM services were again reiterated with patient, who continues to provide verbal consent for [facility] CM involvement in her care. Pleasant 30 minute phone call  Today, patient reports that she "is feeling much better" after recent ED visit 07/08/2019 for dehydration; she denies problems/ concerns/ questions and reports that she is drinking fluids to maintain hydration; prevention of dehydration and UTI discussed with patient, as well as importance of maintaining fluid balance without overload in setting of CHF.  Patient denies pain and new/ recent falls and confirms that she continues driving herself to all provider appointments.  Reports that her leg "is healed" and confirms and that she completed antibiotics as prescribed.  Patient sounds to be in no apparent distress throughout entirety of phone call today.  Patient further reports:  -- Has all medicationsand takes as prescribed;denies questions/ concernsabout current medications -- continues self-managing medications -- reports that she has stopped taking metoprolol, stating that this change was recently advised "at my doctor  appointment after my ER visit;" discussed with patient that I do not see that this medication was discontinued, however, patient inisists that she was told to stop taking it, "due to low blood pressure and being dehydrated."  Patient unsure if this was "a permanent change or a temporary change;" discussed with patient that I would reach out to PCP to clarify and contact her and she is agreeable to this.  Reports that she continues to take amlodipine and that her blood pressures at home are running between "114-148/55-75" this week, post recent ED and PCP office visits last week; medication list in EMR was updated according to patient report  -- All recent / upcoming provider appointments were reviewed with patient today; patient continues driving self to appointments ---- Attended PCP 07/11/2019- states changes were made to "stop metoprolol" only but she is not sure if this is a permanent change or temporary, as noted above.  confirms that PCP ordered home health PT, and confirms that home health team did come and assess her but that patient "did so well," she "didn't need services." ---- attended cardiology 07/01/2019--discussed her recent episodes of increased activity intolerance and whether or not cardiology provider agrees that cardiac rehabilitation is adviseable  Self-health management ofchronic disease state of CHF/ CAD/ COPD: -- has started performing daily weight monitoring/ recording, and together we reviewed previously provided educational material on importance/ value of daily weight monitoring at home.  Discussed/ reiterated weight gain guidelines in setting of CHF along with corresponding action plan; discussed signs/ symptoms yellow CHF zone, along with corresponding action plan.  Encouraged patient to continue this important daily routine and provided positive reinforcement that she has initiated this routine in her daily schedule -- reports daily weight ranges over last week between  "125-128 lbs" without weight  gain >2 lbs overnight/ 5 lbs in one week -- discussed importance of being active but not over-doing activity -- discussed importance of maintaining fluid intake to prevent dehydration but to avoid overload -- reports takes blood pressures "several times per week," and this was encouraged; reviewed recent blood pressures at home with patient -- discussed signs/ symptoms MI/ CVA, along with corresponding action plan- provided education around same as indicated -- discussed with patient signs/ symptoms dehydration given recent ED visit for same  Patient denies further issues, concerns, or problems today, and Iconfirmed that she hasmy direct phone number, the main [facility] CM office phone number, and the [facility] CM 24-hour nurse advice phone number should issues arise prior to next scheduled [place] outreach. Encouraged patient to contact me directly if needs, questions, issues, or concerns arise prior to next scheduled outreach; patient agreed to do so.  Plan:  Patient will take medications as prescribed and will attend all scheduled provider appointments  Patient will promptly notify care providers for any new concerns/ issues/ problems that arise  Patient will continue monitoring/ and begin recording daily weightsat home and will follow action plan for weight gain guidelines in setting of CHF   I will share today's notes/ care plan with patient's PCP as [facility] CM Initial assessment and will request clarification of blood pressure medication by PCP  [facility] CM outreach to continue with scheduled phone call in 3 weeks   [facility] CM Care Plan Problem One     Most Recent Value  Care Plan Problem One  High Risk for hospital readmission related to/ as evidenced by recent hospital visit for chest pain September 28-29, 2020  Role Documenting the Problem One  Care Management Coordinator  Care Plan for Problem One  Active  THN Long Term Goal   Over the next 21  days, patient will not experience hospital readmission, as evidenced by patient reporting and review of EMR during [facility] RN CM outreach [goal extended due to recent ED visit]  THN Long Term Goal Start Date  07/16/19  Interventions for Problem One Long Term Goal  Discussed with patient recent ED visit and current clinical condition and confirmed that patient is feeling better and has no current clinical concerns,  reviewed with patient reasons to call EMS vs. seek care from care provider team,  confirmed that patient has no current medication concerns  THN CM Short Term Goal #1   Over the next 30 days, patient will begin to record her weights monitored at home 3-4 times per week, as evidenced by patient reporting during [facility] RN CM outreach  [facility] CM Short Term Goal #1 Start Date  06/27/19  [facility] CM Short Term Goal #1 Met Date  07/16/19 [Goal Met]  Interventions for Short Term Goal #1  Confirmed that patient has begun monitoring/ recording daily weights at home- positive reinforcement provided  THN CM Short Term Goal #2   Over the next 30 days, patient will attend all scheduled provider appointments, as evidenced by patient reporting and review of EMR during [facility] RN CM outreach  [facility] CM Short Term Goal #2 Start Date  06/27/19  [facility] CM Short Term Goal #2 Met Date  07/16/19 [Goal Met]  Interventions for Short Term Goal #2  Confirmed that patient continues transporting self to doctors appointments and has no transportation concerns,  reviewed recent and upcoming provider appointments with patient,  sent [facility] CM initial assessment to PCP    [facility] CM Care Plan Problem  Two     Most Recent Value  Care Plan Problem Two  Knowledge deficit around self-health management of chronic disease state of CHF, as evidenced by patient reporting  Role Documenting the Problem Two  Care Management [place] for Problem Two  Active  Interventions for Problem Two Long Term Goal   Confirmed that patient has begun monitoring  and recording daily weights at home and reviewed previously provided printed educational material with patient around weight gain guidelines in setting of CHF,  initiated educatin around yellow CHF zone along with corresponding action plan for same  [facility] Long Term Goal  Over the next 60 days, patient will contiue monitoring and recording daily weights at home as evidenced by patient reporting and review of same during [facility] RN CM outreach  [facility] Long Term Goal Start Date  07/16/19  THN CM Short Term Goal #1   Over the next 21 days, patient will continue to monitor and record blood pressures three times per week as evidenced by patient reporting and review of same during [facility] RN CM outreach  [facility] CM Short Term Goal #1 Start Date  07/16/19  Interventions for Short Term Goal #2   Discussed with patient her routine of monitoring blood pressures at home and reviewed recently recorded blood pressures,  discussed her current medications for blood pressure and sent PCP secure message through EMR to address currently prescribed medications for patient clarification     Oneta Rack, RN, BSN, [place] Coordinator [facility] Care Management  [phone]

## 2019-07-23 ENCOUNTER — Ambulatory Visit: Payer: Self-pay | Admitting: *Deleted

## 2019-07-23 ENCOUNTER — Telehealth: Payer: Self-pay | Admitting: Internal Medicine

## 2019-07-23 DIAGNOSIS — E782 Mixed hyperlipidemia: Secondary | ICD-10-CM

## 2019-07-23 MED ORDER — ATORVASTATIN CALCIUM 20 MG PO TABS: 20.0000 mg | ORAL_TABLET | Freq: Every day | ORAL | 3 refills | Status: DC

## 2019-07-23 NOTE — Assessment & Plan Note (Signed)
I am recommending a medication change due to a potential interaction between amlodipine and simvastatin .  I am recommending that she stop the simvastatin and start taking atorvastatin instead .  I have sent the medication to  local pharmacy.

## 2019-07-23 NOTE — Telephone Encounter (Signed)
Message from [name] sent at 07/23/2019 5:02 PM EST  Summary: med ques    Patient requesting call back from NT to discuss medications and potential interactions. Office closed. Requesting to speak with triage RN.           Patient called due to receiving a new prescription for Atorvastatin from the pharmacy.Notes from Dr. Derrel Nip in telephone encounter on 07/23/19 read to the patient regarding changing simvastatin to atorvastatin due to potential interaction with amlodipine.Pt verbalized understanding. Pt has not read MyChart message sent by Dr. Derrel Nip previously. Patient also confirmed that she was not taking Metoprolol or Lexapro at this time.  No other questions or concerns voiced at this time.   Reason for Disposition . Caller has medication question only, adult not sick, and triager answers question  Answer Assessment - Initial Assessment Questions 1.   NAME of MEDICATION: "What medicine are you calling about?"     Atorvastatin- new prescription and being informed at the pharmacy about a potential interaction with another medication(pt thought it was regarding Metoprolol) 2.   QUESTION: "What is your question?"    Should I be taking this new prescription 3.   PRESCRIBING HCP: "Who prescribed it?" Reason: if prescribed by specialist, call should be referred to that group.     Dr. Derrel Nip 4. SYMPTOMS: "Do you have any symptoms?"     n/a 5. SEVERITY: If symptoms are present, ask "Are they mild, moderate or severe?"     n/a 6.  PREGNANCY:  "Is there any chance that you are pregnant?" "When was your last menstrual period?"     n/a  Protocols used: MEDICATION QUESTION CALL-A-AH

## 2019-07-23 NOTE — Telephone Encounter (Signed)
My Chart message sent

## 2019-07-24 DIAGNOSIS — L570 Actinic keratosis: Secondary | ICD-10-CM | POA: Diagnosis not present

## 2019-07-24 DIAGNOSIS — C44729 Squamous cell carcinoma of skin of left lower limb, including hip: Secondary | ICD-10-CM | POA: Diagnosis not present

## 2019-07-24 DIAGNOSIS — R208 Other disturbances of skin sensation: Secondary | ICD-10-CM | POA: Diagnosis not present

## 2019-07-24 DIAGNOSIS — X32XXXA Exposure to sunlight, initial encounter: Secondary | ICD-10-CM | POA: Diagnosis not present

## 2019-07-24 DIAGNOSIS — D485 Neoplasm of uncertain behavior of skin: Secondary | ICD-10-CM | POA: Diagnosis not present

## 2019-08-06 ENCOUNTER — Other Ambulatory Visit: Payer: Self-pay | Admitting: *Deleted

## 2019-08-06 ENCOUNTER — Encounter: Payer: Self-pay | Admitting: *Deleted

## 2019-08-06 NOTE — Patient Outreach (Signed)
[place] [facility]) Care Management [place] Telephone Outreach Post-Allen discharge day # 48 without Allen readmission  08/06/2019  [name] [birth year] [id number]  Successful telephone outreach toBetty Allen, 85 y/o female, routine referred to [name] by [name] 06/17/2019 for self health management of CHF.Patient has history including, but not limited to, CHF; COPD; HTN/ HLD; CAD; CKD- III. Noted from review of EMR that patient experienced post- referral observationhospitalization September 28-29, 2020 for chest pain; patient was ruled out for ACS/ MI and was discharged home to self-care without home health services in place.  HIPAA/ identity verified with patient during phone call today; patient denies clinical concerns, pain, new/ recent falls and sounds to be in no distress throughout phone call today.  Patient further reports; -- no concerns around medications; able to continue self-managing medications and verbalize new instructions recently made by PCP around addition of Lipitor- patient confirms that she has made this change as advised by PCP.  Declines my offer to review all medications with her today; states she has everything with medications "under control."  Denies questions around medications ---- tells me today that she continues using NTG SL "as needed" "about once a week," for chest tightness/ "weakness;" denies chest "pain."  States that one NTG "along with rest helps very much;" using teach back method, I proactively discussed with patient side effect of NTG and advised her to only take this medication when she is lying/ sitting down due to drug mechanism of action; patient appreciative of this information.  Discussed instructions for using NTG, along with action plan for times when she needs to use > 3 NTG without relief os symptoms (call 9-1-1) which patient is familiar with; patient is not concerned, stating that "one  always helps" without need to take more ---- confirms today that she remains off (not taking) metoprolol as instructed by PCP during office visit in October; states PCP is aware she is not taking  -- patient asks me today what she should do if she were to develop signs/ symptoms corona virus, as she heard on the news that the cases are increasing; she admits that she does not know all of the signs/ symptoms and stated she " guessed" she would contact EMS if she developed signs/ symptoms ---- using teach back method, provided education around signs/ symptoms corona virus, along with action plan to contact PCP immediately for instructions on plan to obtain testing; discussed reasons patient would contact urgent/ emergent care/ call 9-1-1; patient assures me today that she does NOT currently have any signs/ symptoms, but "just wanted to know" what do if she were to develop in the future ---- patient confirms that she is following recommended community precautions around corona virus  -- no recent or upcoming provider appointments noted; patient confirms that she continues driving self to errands; daughter assists with transportation as indicated  Self-health management ofchronic disease state of CHF/ CAD/ COPD: --has continued daily weight monitoring/ recording, and we reviewed recent weights at home; reports daily weight ranges "the same" between 128-130 lbs consistently; able to verbalize weight gain guidelines/ corresponding action plan in setting of CHF -- reiterated previously provided education around early/ late signs CHF exacerbation- encouraged patient to promptly notify care providers for any development of early signs -- patient continues staying as active "as possible," noting that she "knows" she has gotten weaker since sh eis no longer able to play golf regularly -- reports blood pressures "all good  at home," and we reviewed reports ranges between 130- 153/ 62-75  Patient denies  further issues, concerns, or problems today, and Iconfirmed that she hasmy direct phone number, the main [name] Allen office phone number, and the [name] Allen 24-hour nurse advice phone number should issues arise prior to next scheduled [place] outreach next month. Discussed with patient that she has made great progress meeting her previously established THN Allen goals, and we discussed eventual transfer back to [name] in the future, to which patient is agreeable.  Encouraged patient to contact me directly if needs, questions, issues, or concerns arise prior to next scheduled outreach; patient agreed to do so.  Plan:  Patient will take medications as prescribed and will attend all scheduled provider appointments  Patient will promptly notify care providers for any new concerns/ issues/ problems that arise  Patient will continue monitoring/and beginrecording daily weightsat home and will follow action plan for weight gain guidelines in setting of CHF  [name] outreach to continue with scheduled phone callnext month   [name] Allen Care Plan Problem One     Most Recent Value  Care Plan Problem One  High Risk for Allen readmission related to/ as evidenced by recent Allen visit for chest pain September 28-29, 2020  Role Documenting the Problem One  Care Management Coordinator  Care Plan for Problem One  Not Active  [facility] Long Term Goal   Over the next 21 days, patient will not experience Allen readmission, as evidenced by patient reporting and review of EMR during [facility] RN Allen outreach  [name] Long Term Goal Start Date  07/16/19  [name] Long Term Goal Met Date  08/06/19 [name] Met]  Interventions for Problem One Long Term Goal  Discussed with patient her current clinical condition and confirmed that she does not have curent clinical concerns,  discussed signs/ symptoms and corresponding action plan should patient develop signs/ symptoms corona virus,  reinforced reasons to seek  urgent/ emeregnt care vs. routine care from established providers    [facility] Care Plan Problem Two     Most Recent Value  Care Plan Problem Two  Knowledge deficit around self-health management of chronic disease state of CHF, as evidenced by patient reporting  Role Documenting the Problem Two  Care Management Coordinator  Care Plan for Problem Two  Active  Interventions for Problem Two Long Term Goal   Reviewed with patient weight ranges at home and confirmed that she continues monitoring and recording daily weights,  discussed early/ late signs CHF exacerbation and action plan for weight gain,  discussed patient's use of NTG and using teach back method, provided education aorund precautions around using NTG at home,  discussed recent medication changes made by PCP and confirmed that patient has iitiated changes as instructed  THN Long Term Goal  Over the next 60 days, patient will contiue monitoring and recording daily weights at home as evidenced by patient reporting and review of same during [facility] RN Allen outreach  [place] [facility] Long Term Goal Start Date  07/16/19  [facility] Allen Short Term Goal #1   Over the next 21 days, patient will continue to monitor and record blood pressures three times per week as evidenced by patient reporting and review of same during [facility] RN Allen outreach  [facility] Short Term Goal #1 Start Date  07/16/19  [facility] Short Term Goal #1 Met Date   08/06/19 [Goal met]  Interventions for Short Term Goal #2  Confirmed that patient continues monitorig and recording blood pressures at home and reviewed recent blood pressures with patient     Oneta Rack, RN, BSN, [place] Care Management  [phone]

## 2019-08-20 ENCOUNTER — Ambulatory Visit: Payer: Medicare Other | Admitting: Internal Medicine

## 2019-09-02 ENCOUNTER — Other Ambulatory Visit: Payer: Self-pay | Admitting: Internal Medicine

## 2019-09-03 ENCOUNTER — Encounter: Payer: Self-pay | Admitting: *Deleted

## 2019-09-03 ENCOUNTER — Other Ambulatory Visit: Payer: Self-pay | Admitting: *Deleted

## 2019-09-03 NOTE — Patient Outreach (Signed)
[facility]) Care Management [place] Telephone Outreach Post-hospital discharge day # 76 without hospital readmission  09/03/2019  [name] 1926-06-03 [id number]   Successful telephone outreach toBetty Allen, 85 y/o female, routine referred to [place] by [place] 06/17/2019 for self health management of CHF.Patient has history including, but not limited to, CHF; COPD; HTN/ HLD; CAD; CKD- III. Noted from review of EMR that patient experienced post- referral observationhospitalization September 28-29, 2020 for chest pain; patient was ruled out for ACS/ MI and was discharged home to self-care without home health services in place.  HIPAA/ identity verified with patient during phone call today; patient denies clinical concerns, pain, new/ recent falls and sounds to be in no distress throughout phone call today.  Patient further reports; -- no concerns or recent changes around medications; continues self-managing medications and denies questions around current medications; continues using NTG SL prn chest discomfort, but reports "has not needed in several weeks."  -- patient asks me today what she should do to obtain vaccine for corona virus, and I encouraged her to contact her PCP to inquire about their plan for administering, based on practice availability; patient reports she will contact PCP "soon" to inquire; again confirms that she is following recommended community precautions around corona virus  -- no recent or upcoming provider appointments noted; patient confirms that she continues driving self to errands; daughter assists with transportation as indicated  Self-health management ofchronic disease state of CHF/ CAD/ COPD: --has continued daily weight monitoring/ recording,and we reviewed recent weights at home; again reports daily weight ranges "the same" between 128-130 lbs consistently; remains able to verbalize weight  gain guidelines/ corresponding action plan in setting of CHF -- using teach back method, reiterated previously provided education around early/ late signs CHF exacerbation- encouraged patient to promptly notify care providers for any development of early signs, which she denies today -- patient continues staying as active "as possible," noting that she "knows" she has gotten weaker since she is no longer able to play golf regularly-- however, she does tell me that she was 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  Continues monitoring blood pressures weekly.  Patient denies further issues, concerns, or problems today, and Iconfirmed thatshehasmy direct phone number, the main [place] office phone number, and the [facility] CM 24-hour nurse advice phone number should issues arise prior to next scheduled [place] outreach next month. Discussed with patient that she has made great progress meeting her previously established [facility] CM goals, and we again discussed eventual transfer back to [place] in the future, to which patient is agreeable.  Encouraged patient to contact me directly if needs, questions, issues, or concerns arise prior to next scheduled outreach; patient agreed to do so.  Plan:  Patient will take medications as prescribed and will attend all scheduled provider appointments  Patient will promptly notify care providers for any new concerns/ issues/ problems that arise  Patient will continue monitoring/and beginrecording daily weightsat homeand will follow action plan for weight gain guidelines in setting of CHF  [facility] CM outreach to continue with scheduled phone  callnext month  [facility]  CM Care Plan Problem Two     Most Recent Value  Care Plan Problem Two  Knowledge deficit around self-health management of chronic disease state of CHF, as evidenced by patient reporting  Role Documenting the Problem Two  Care Management [place] for Problem Two  Active  Interventions for Problem Two Long Term Goal   reviewed with patient recent weights at home and confirmed that she is able to verbalize weight gain guidelines in setting of CHF along with corresponding action plan,  confirmed that patient has no current clinical concerns or recent medication changes,  positive reinforcement provided for patient adhering to established plan of care  [facility] Long Term Goal  Over the next 45 days, patient will contiue monitoring and recording daily weights at home as evidenced by patient reporting and review of same during [facility] RN CM outreach [Goal extended today]  [facility] Long Term Goal Start Date  09/03/19  [facility] CM Short Term Goal #1   Over the next 30 days, patient will verbalize plan to receive coronavirus vaccine, as evidenced by patient reporting during [facility] RN CM outreach  [facility] CM Short Term Goal #1 Start Date  09/03/19  Interventions for Short Term Goal #2   Discussed with patient her desire to obtain coronavirus vaccine and encouraged patient to contact her PCP for information around being placed on vaccine waiting list,  encouraged patient to make this call promptly     Oneta Rack, RN, BSN, [employer] Coordinator [facility] Care Management  [phone]

## 2019-09-03 NOTE — Telephone Encounter (Signed)
Opened in Error.

## 2019-09-18 DIAGNOSIS — L308 Other specified dermatitis: Secondary | ICD-10-CM | POA: Diagnosis not present

## 2019-09-18 DIAGNOSIS — C44729 Squamous cell carcinoma of skin of left lower limb, including hip: Secondary | ICD-10-CM | POA: Diagnosis not present

## 2019-09-18 DIAGNOSIS — L570 Actinic keratosis: Secondary | ICD-10-CM | POA: Diagnosis not present

## 2019-10-03 ENCOUNTER — Encounter: Payer: Self-pay | Admitting: *Deleted

## 2019-10-03 ENCOUNTER — Other Ambulatory Visit: Payer: Self-pay | Admitting: *Deleted

## 2019-10-03 NOTE — Patient Outreach (Signed)
[facility]) Care Management [place] Telephone Outreach Post-hospital discharge day # 106 without re-admission  10/03/2019  [name] 1926/01/23 [id number]  Successful telephone outreach toBetty Allen, 86 y/o female, routine referred to [place] by [place] 06/17/2019 for self health management of CHF.Patient has history including, but not limited to, CHF; COPD; HTN/ HLD; CAD; CKD- III. Noted from review of EMR that patient experienced post- referral observationhospitalization September 28-29, 2020 for chest pain; patient was ruled out for ACS/ MI and was discharged home to self-care without home health services in place.  HIPAA/ identity verified with patient during phone call today;patient denies clinical concerns, pain, new/ recent falls and sounds to be in no distress throughout phone call today.  Patient further reports; -- no concerns or recent changes around medications; continues self-managing medications and denies questions around current medications  -- patient confirms that she has received her first dose of vaccine for corona virus, reports received at health department, and verbalizes plans to obtain second vaccine dose "as soon as they contact me"  -- no recent provider appointments noted; patient confirms that she plans to attend scheduled cardiology provider office visit on October 21, 2019- confirms that she has reliable transportation through family/ daughter  Self-health management ofchronic disease state of CHF/ CAD/ COPD: --hascontinueddaily weight monitoring/ recording, reviewedrecent weights at home; again reports daily weight ranges "the same" between 128-130 lbsconsistently with weight this morning of "129 lbs." -- remains able to verbalize weight gain guidelines/ corresponding action plan in setting of CHF -- using teach back method, reiterated previously provided education around early/ late signs  CHF exacerbation- encouraged patient to promptly notify care providers for any development of early signs, which she denies today -- reports blood pressures "all good at home," and again declines review of same, stating that "they are all fine."  Continues monitoring blood pressures weekly.  Patient denies further issues, concerns, or problems today, and Iconfirmed thatshehasmy direct phone number, the main [place] office phone number, and the [facility] CM 24-hour nurse advice phone number should issues arise prior to next scheduled [place] month, post- scheduled cardiology provider office visit.   Again discussed with patient that she has made great progress meeting her previously established [facility] CM goals, and eventual transfer back to [place] in the future, to which patient is agreeable.Encouraged patient to contact me directly if needs, questions, issues, or concerns arise prior to next scheduled outreach; patient agreed to do so.  Plan:  Patient will take medications as prescribed and will attend all scheduled provider appointments  Patient will promptly notify care providers for any new concerns/ issues/ problems that arise  Patient will continue monitoring/and beginrecording daily weightsat homeand will follow action plan for weight gain guidelines in setting of CHF  [facility] Community CM outreach to continue with scheduled phone callnext month  [facility] CM Care Plan Problem Two     Most Recent Value  Care Plan Problem Two  Need for ongoing reinforcement around self-health management of chronic disease state of CHF, as evidenced by patient reporting [problem re-stated today]  Role Documenting the Problem Two  Care Management Coordinator  Care Plan for Problem Two  Active  Interventions for Problem Two Long Term Goal   Discussed current clinical condition with patient and confirmed that she does not have any current clinical concerns,  confirmed that  patient continues monitoring and recording daily weights and reviewed with patient  recorded weights at home,  reiterated with patient previously provided education around signs/ symptoms CHF yellow zone, along with corresponding action plan  [facility] Long Term Goal  Over the next 45 days, patient will contiue monitoring and recording daily weights at home as evidenced by patient reporting and review of same during [facility] RN CM outreach [Goal extended today]  [facility] Long Term Goal Start Date  10/03/19 [Goal extended today]  [facility] CM Short Term Goal #1   Over the next 30 days, patient will verbalize plan to receive coronavirus vaccine, as evidenced by patient reporting during [facility] RN CM outreach  [facility] CM Short Term Goal #1 Start Date  09/03/19  [facility] CM Short Term Goal #1 Met Date   10/03/19 [Goal met]  Interventions for Short Term Goal #2   Confirmed that patient has received the first dose of the corona virus vaccine and understands plan to obtain second dose  [facility] CM Short Term Goal #2   Over the next 30 days, patient will attend cardiology provider office visit as scheduled, as evidenced by patient reporting and review of EMR during [facility] RN CM outreach  [facility] CM Short Term Goal #2 Start Date  10/03/19  Interventions for Short Term Goal #2  Confirmed that patient plans to attend scheduled cardiology provider appointment and that she has ongoing reliable transportation through family members,  encouraged patient to share recorded daily weights at home with cardiology provider      Oneta Rack, RN, BSN, [place] Care Management  [phone]

## 2019-10-08 ENCOUNTER — Other Ambulatory Visit: Payer: Self-pay | Admitting: Internal Medicine

## 2019-10-18 NOTE — Progress Notes (Signed)
Cardiology Office Note  Date:  10/21/2019   ID:  [name], DOB 1926-04-01, MRN [id number]  PCP:  [name], MD   Chief Complaint  Patient presents with  . OTHER    Sob pt using nitro c/o left should pain. Meds reviewed verbally with pt.    HPI:  [name] is a pleasant 86 year old woman with a history of  CAD, bypass surgery in 1999, Stress test October 2017 with no ischemia COPD, Smoking for 40 years who quit in 1985,  chronic renal insufficiency,  hypertension,  hyperlipidemia,  40-50% bilateral carotid arterial disease  EF >60% in 05/2015  C. difficile in January 2015. H/o fall wet floor  02/2017, Suffered a pelvic fracture, left shoulder pain who presents for routine followup of her coronary artery disease  In general reports that she has been doing relatively well  Reports having one episode of paroxysmal tachycardia approximately 1 week ago Tachycardia woke her up from sleep Rate was rapid, lasted approximately 30 minutes Took asa, NTG, heating pad, then went away Denies having prior episodes, none since then  Otherwise reports she is active, denies any chest pain She does have some pain left pectoral area when she pushes in that area her when she tries to raise her left arm  Chronic mild shortness of breath Feels that she has been doing well on Imdur twice a day  Prior issues with insomnia, anxiety, well controlled recently  EKG personally reviewed by myself on todays visit Shows sinus bradycardia rate 57 bpm ST-T wave abnormality precordial leads, inferior leads  Other past medical history reviewed Stress test 02/2019 Pharmacological myocardial perfusion imaging study with no significant ischemia Unable to exclude mild ischemia lateral wall on non-attenuation corrected images Normal wall motion, EF estimated at 56% EKG with equivocal ST abnormality V2 through V5 following infusion of lexiscan.  Patient did report chest pain following  infusion. Rare PVC noted Moderate risk scan , secondary to equivocal EKG changes, chest pain during the study  Previously treated with chemotherapy for cancer on right lower extremity  Previous stress test April 2019 reviewed with her showing no significant ischemia  Hospital admission 12/21/2016 for COPD exacerbation Syncope, dehydrated She received steroids,duo nebs, ABX   PMH:   has a past medical history of C. difficile colitis, CAD (coronary artery disease), Carotid artery disease ([place]), Closed fracture pubis ([place]) (07/04/2017), COPD (chronic obstructive pulmonary disease) ([place]), Diffuse cystic mastopathy, Diverticulitis, Dizziness, Gait abnormality, GERD (gastroesophageal reflux disease), History of migraines, CABG, Hyperlipidemia, Hypertension, Indigestion, Kidney stones, Rheumatic fever, Sinus bradycardia, SOB (shortness of breath), and Syncopal episodes.  PSH:    Past Surgical History:  Procedure Laterality Date  . APPENDECTOMY  1950  . BREAST BIOPSY Right 1994  . BREAST CYST ASPIRATION     multiple BIL  . CATARACT EXTRACTION Right 2009  . CATARACT EXTRACTION Left 2011  . COLONOSCOPY  [id number]   Dr. Jamal Collin  . CORONARY ARTERY BYPASS GRAFT  1999  . esophagus stretched    . TONSILLECTOMY  1939  . TOTAL ABDOMINAL HYSTERECTOMY  1968   due to metrorrhagia    Current Outpatient Medications  Medication Sig Dispense Refill  . amLODipine (NORVASC) 2.5 MG tablet TAKE 1 TABLET BY MOUTH DAILY 90 tablet 1  . atorvastatin (LIPITOR) 20 MG tablet Take 1 tablet (20 mg total) by mouth daily. 90 tablet 3  . cephALEXin (KEFLEX) 500 MG capsule Take 1 capsule (500 mg total) by mouth 4 (four) times daily. 28 capsule  0  . clopidogrel (PLAVIX) 75 MG tablet TAKE ONE TABLET EVERY DAY 90 tablet 2  . escitalopram (LEXAPRO) 10 MG tablet One tablet by mouth daily after dinner 30 tablet 2  . esomeprazole (NEXIUM) 40 MG capsule TAKE 1 CAPSULE DAILY BEFORE BREAKFAST 90 capsule 1  .  Ipratropium-Albuterol (COMBIVENT RESPIMAT) 20-100 MCG/ACT AERS respimat Inhale 1 puff into the lungs every 6 (six) hours as needed for wheezing. 4 g 2  . isosorbide mononitrate (IMDUR) 60 MG 24 hr tablet TAKE 1 TABLET BY MOUTH TWICE DAILY 180 tablet 0  . LORazepam (ATIVAN) 0.5 MG tablet TAKE ONE AND ONE-HALF TABLET BY MOUTH ASNEEDED FOR INSOMNIA 45 tablet 2  . losartan (COZAAR) 100 MG tablet TAKE ONE TABLET BY MOUTH EVERY DAY 90 tablet 3  . nitroGLYCERIN (NITROSTAT) 0.4 MG SL tablet PLACE 1 TABLET UNDER TONGUE EVERY 5 MIN AS NEEDED FOR CHEST PAIN IF NO RELIEF IN15 MIN CALL 911 (MAX 3 TABS) 25 tablet 1   No current facility-administered medications for this visit.     Allergies:   Codeine   Social History:  The patient  reports that she quit smoking about 36 years ago. Her smoking use included cigarettes. She has a 20.00 pack-year smoking history. She has never used smokeless tobacco. She reports previous alcohol use. She reports that she does not use drugs.   Family History:   family history includes Heart disease in her father and mother.    Review of Systems: Review of Systems  Constitutional: Negative.   HENT: Negative.   Respiratory: Positive for shortness of breath.   Cardiovascular: Positive for palpitations.  Gastrointestinal: Negative.   Musculoskeletal: Negative.   Neurological: Negative.   Psychiatric/Behavioral: Negative.   All other systems reviewed and are negative.   PHYSICAL EXAM: VS:  BP (!) 150/68 (BP Location: Left Arm, Patient Position: Sitting, Cuff Size: Normal)   Pulse (!) 57   Ht 5\' 2"  (1.575 m)   Wt 128 lb 12 oz (58.4 kg)   SpO2 98%   BMI 23.55 kg/m  , BMI Body mass index is 23.55 kg/m. Constitutional:  oriented to person, place, and time. No distress.  HENT:  Head: Grossly normal Eyes:  no discharge. No scleral icterus.  Neck: No JVD, no carotid bruits  Cardiovascular: Regular rate and rhythm, no murmurs appreciated Pulmonary/Chest: Clear to  auscultation bilaterally, no wheezes or rails Abdominal: Soft.  no distension.  no tenderness.  Musculoskeletal: Normal range of motion Neurological:  normal muscle tone. Coordination normal. No atrophy Skin: Skin warm and dry Psychiatric: normal affect, pleasant  Recent Labs: 07/08/2019: ALT 12; BUN 19; Creatinine, Ser 1.19; Hemoglobin 13.5; Platelets 187; Potassium 4.5; Sodium 141 07/11/2019: TSH 2.08    Lipid Panel Lab Results  Component Value Date   CHOL 120 04/02/2019   HDL 35.90 (L) 04/02/2019   LDLCALC 57 04/02/2019   TRIG 135.0 04/02/2019      Wt Readings from Last 3 Encounters:  10/21/19 128 lb 12 oz (58.4 kg)  07/11/19 125 lb 12.8 oz (57.1 kg)  07/08/19 130 lb (59 kg)     ASSESSMENT AND PLAN:  Essential hypertension -  Blood pressure is well controlled on today's visit. No changes made to the medications.  Coronary artery disease involving native coronary artery of native heart without angina pectoris -  Prior stress test no significant ischemia No further testing at this time Symptoms well controlled on isosorbide twice daily  History of bypass surgery Plan as above, surgery 2006 Chronic  shortness of breath, no active at baseline Having atypical chest pain  Carotid stenosis Less than 39% bilaterally, significant plaque noted Cholesterol at goal  Hyperlipidemia On Lipitor 20 daily, goal LDL less than 70    Total encounter time more than 25 minutes  Greater than 50% was spent in counseling and coordination of care with the patient   Disposition:   F/U  12 months   No orders of the defined types were placed in this encounter.    Signed, Esmond Plants, M.D., Ph.D. 10/21/2019  [place], [place]

## 2019-10-21 ENCOUNTER — Other Ambulatory Visit: Payer: Self-pay | Admitting: Internal Medicine

## 2019-10-21 ENCOUNTER — Ambulatory Visit (INDEPENDENT_AMBULATORY_CARE_PROVIDER_SITE_OTHER): Payer: Medicare Other | Admitting: Cardiovascular Disease

## 2019-10-21 ENCOUNTER — Encounter: Payer: Self-pay | Admitting: Cardiovascular Disease

## 2019-10-21 ENCOUNTER — Other Ambulatory Visit: Payer: Self-pay | Admitting: Cardiovascular Disease

## 2019-10-21 ENCOUNTER — Other Ambulatory Visit: Payer: Self-pay

## 2019-10-21 VITALS — BP 150/68 | HR 57 | Ht 62.0 in | Wt 128.8 lb

## 2019-10-21 DIAGNOSIS — I25118 Atherosclerotic heart disease of native coronary artery with other forms of angina pectoris: Secondary | ICD-10-CM | POA: Diagnosis not present

## 2019-10-21 DIAGNOSIS — I10 Essential (primary) hypertension: Secondary | ICD-10-CM | POA: Diagnosis not present

## 2019-10-21 DIAGNOSIS — N1831 Chronic kidney disease, stage 3a: Secondary | ICD-10-CM

## 2019-10-21 DIAGNOSIS — I6523 Occlusion and stenosis of bilateral carotid arteries: Secondary | ICD-10-CM | POA: Diagnosis not present

## 2019-10-21 DIAGNOSIS — E782 Mixed hyperlipidemia: Secondary | ICD-10-CM

## 2019-10-21 DIAGNOSIS — I700 Atherosclerosis of aorta: Secondary | ICD-10-CM

## 2019-10-21 DIAGNOSIS — Z951 Presence of aortocoronary bypass graft: Secondary | ICD-10-CM

## 2019-10-21 DIAGNOSIS — N183 Chronic kidney disease, stage 3 unspecified: Secondary | ICD-10-CM

## 2019-10-21 MED ORDER — CLOPIDOGREL BISULFATE 75 MG PO TABS: 75.0000 mg | ORAL_TABLET | Freq: Every day | ORAL | 3 refills | Status: DC

## 2019-10-21 MED ORDER — AMLODIPINE BESYLATE 2.5 MG PO TABS: 2.5000 mg | ORAL_TABLET | Freq: Every day | ORAL | 2 refills | Status: DC

## 2019-10-21 MED ORDER — ISOSORBIDE MONONITRATE ER 60 MG PO TB24: 60.0000 mg | ORAL_TABLET | Freq: Two times a day (BID) | ORAL | 3 refills | Status: DC

## 2019-10-21 MED ORDER — PROPRANOLOL HCL 20 MG PO TABS: 20.0000 mg | ORAL_TABLET | Freq: Three times a day (TID) | ORAL | 1 refills | Status: DC | PRN

## 2019-10-21 NOTE — Patient Instructions (Addendum)
Can we look to see if flu shot was give in late 2020 05/14/2020  Medication Instructions:  Please take propranolol as needed if you wake up with tachycardia   If you need a refill on your cardiac medications before your next appointment, please call your pharmacy.    Lab work: No new labs needed   If you have labs (blood work) drawn today and your tests are completely normal, you will receive your results only by: Marland Kitchen MyChart Message (if you have MyChart) OR . A paper copy in the mail If you have any lab test that is abnormal or we need to change your treatment, we will call you to review the results.   Testing/Procedures: No new testing needed   Follow-Up: At [facility], you and your health needs are our priority.  As part of our continuing mission to provide you with exceptional heart care, we have created designated Provider Care Teams.  These Care Teams include your primary Cardiologist (physician) and Advanced Practice Providers (APPs -  Physician Assistants and Nurse Practitioners) who all work together to provide you with the care you need, when you need it.  . You will need a follow up appointment in 12 months   . Providers on your designated Care Team:   . Murray Hodgkins, NP . Christell Faith, PA-C . Marrianne Mood, PA-C  Any Other Special Instructions Will Be Listed Below (If Applicable).  For educational health videos Log in to : [email or web address] Or : [email or web address], password : [email or web address]

## 2019-10-31 ENCOUNTER — Encounter: Payer: Self-pay | Admitting: *Deleted

## 2019-10-31 ENCOUNTER — Other Ambulatory Visit: Payer: Self-pay | Admitting: *Deleted

## 2019-10-31 NOTE — Patient Outreach (Signed)
[place] [facility]) Care Management [place] Telephone Outreach Post-hospital discharge day # 135 without unplanned hospital readmission  10/31/2019  [name] [birth year] [id number]  Successful telephone outreach toBetty Allen, 86 y/o female, routine referred to [place] by [place] 06/17/2019 for self health management of CHF.Patient has history including, but not limited to, CHF; COPD; HTN/ HLD; CAD; CKD- III. Noted from review of EMR that patient experienced post- referral observationhospitalization September 28-29, 2020 for chest pain; patient was ruled out for ACS/ MI and was discharged home to self-care without home health services in place.  HIPAA/ identity verified with patient during phone call today;patient denies clinical concerns, pain, new/ recent falls and sounds to be in no distress throughout phone call today.  Reports intermittent shoulder "soreness" that she attributes to taking holiday decorations down and storing; denies pain.  Reports that she attended recent cardiology provider appointment at which time she reported to cardiologist an isolated/ self-limiting episode of "heart racing" that awoke her form her sleep- reports this has not occurred again- positive reinforcement provided for promptly reporting this episode to care provider.   Patient further reports:  -- no concernsaround medications; continuesself-managing medications and denies questions around current medications; confirms that she obtained and has on hand newly prescribed propanolol for reported episode of "heart racing" several weeks ago that resolved spontaneously; confirms that she has not needed to use this medication since obtaining it  -- confirms that she has received both doses of vaccine for corona virus, reports no untoward effects; tolerated well.  -- no upcoming provider appointments noted; patient confirms that she attended scheduled cardiology  provider office visit on October 21, 2019- visit reviewed with patient, who is able to independently verbalize overall plan of care  --hascontinueddaily weight monitoring/ recording, reviewedrecent weights at home;againreports daily weight ranges "the same" between 128-130 lbsconsistently with weight this morning of "128 lbs." --remainsable to verbalize weight gain guidelines/ corresponding action plan in setting of CHF --using teach back method,reiterated previously provided education around early/ late signs CHF exacerbation and purpose of weight monitoring at home- encouraged patient to promptly notify care providers for any development of early signs, which she denies today, stating no shortness of breath outside of baseline, and no swelling of lower extremities -- reports blood pressures "all good at home," andagain declines review of same, stating that "they are all fine." Continues monitoring blood pressures weekly.  Patient denies further issues, concerns, or problems today, and Iconfirmed thatshehasmy direct phone number, the main [place] office phone number, and the [facility] CM 24-hour nurse advice phone number should issues arise prior to next scheduled [facility] CM outreachin 6 weeks.Encouraged patient to contact me directly if needs, questions, issues, or concerns arise prior to next scheduled outreach; patient agreed to do so.  Plan:  Patient will take medications as prescribed and will attend all scheduled provider appointments  Patient will promptly notify care providers for any new concerns/ issues/ problems that arise  Patient will continue monitoring/andrecording daily weightsat homeand will follow action plan for weight gain guidelines in setting of CHF  I will share today's [facility] CM notes/ care plan with patient's PCP as [facility] CM quarterly summary  [place] outreach to continue with scheduled phone callin 6 weeks  [facility] CM Care Plan Problem  Two     Most Recent Value  Care Plan Problem Two  Need for ongoing reinforcement around self-health management of chronic disease state of CHF,  as evidenced by patient reporting   Role Documenting the Problem Two  Care Management [place] for Problem Two  Active  Interventions for Problem Two Long Term Goal   Confirmed that patient continues monitoring and recording daily weights at home,  reiterated previously provided education around purpose of daily weight monitoring in CHF,  weight gain guidelines with corresponding action plan,  and value of dietary salt restriction/ heart healthy diet,  encouraged patient to promptly notify care providers for any new concerns that arise and reviewed previously provided education around reasons to seek emergent/ urgent care vs. contact care providers  [facility] Long Term Goal  Over the next 90 days, patient will contiue monitoring and recording daily weights at home and will promptly contact care providers for any new concerns that arise, as evidenced by patient reporting and review of same during [facility] RN CM outreach [Goal modified/ extended today]  [facility] Long Term Goal Start Date  10/31/19 [Goal modified/ extended today]  [facility] CM Short Term Goal #2   Over the next 30 days, patient will attend cardiology provider office visit as scheduled, as evidenced by patient reporting and review of EMR during [facility] RN CM outreach  [facility] CM Short Term Goal #2 Start Date  10/03/19  [facility] CM Short Term Goal #2 Met Date  10/31/19 [Goal met]  Interventions for Short Term Goal #2  Reviewed cardiology appointment with patient and confirmed that she is able to verbalize accurate understanding of overall plan of care,  confirmed that she obtained and has not needed to use prn medication for isolated episode of palpitations with sensation of heart racing,  discussed/ reiterated previously provided education around safe use of NTG with patient     Oneta Rack, RN, BSN, [place] Coordinator [facility] Care Management  [phone]

## 2019-11-04 ENCOUNTER — Other Ambulatory Visit: Payer: Self-pay | Admitting: Cardiovascular Disease

## 2019-11-05 ENCOUNTER — Other Ambulatory Visit: Payer: Self-pay | Admitting: Cardiovascular Disease

## 2019-12-02 ENCOUNTER — Telehealth: Payer: Self-pay | Admitting: Cardiovascular Disease

## 2019-12-02 DIAGNOSIS — H353131 Nonexudative age-related macular degeneration, bilateral, early dry stage: Secondary | ICD-10-CM | POA: Diagnosis not present

## 2019-12-02 NOTE — Telephone Encounter (Signed)
Pt c/o of Chest Pain: STAT if CP now or developed within 24 hours  1. Are you having CP right now? Sorta, states it is in her back as of now.   2. Are you experiencing any other symptoms (ex. SOB, nausea, vomiting, sweating)? Some SOB, but also has COPD  3. How long have you been experiencing CP?  Couple weeks  4. Is your CP continuous or coming and going? Comes and goes   5. Have you taken Nitroglycerin? Yes, 2 nights ago.  ?

## 2019-12-02 NOTE — Telephone Encounter (Signed)
Patient calling in because at last office visit Dr. Rockey Situ mentioned there could be other medications for her chronic chest pain. Inquired if she had ever tried Ranexa and she stated no. She states that she is unable to come in today to be seen because she has an eye appointment today. Reviewed signs and symptoms with her that would require immediate evaluation in the ED and scheduled her to come see Dr. Rockey Situ on Wednesday. She verbalized understanding of our conversation agreement of plan, and had no further questions at this time.  She did provide her systolic blood pressures: 144/77 150 133 138 133 143

## 2019-12-04 ENCOUNTER — Encounter: Payer: Self-pay | Admitting: Cardiovascular Disease

## 2019-12-04 ENCOUNTER — Other Ambulatory Visit: Payer: Self-pay

## 2019-12-04 ENCOUNTER — Ambulatory Visit (INDEPENDENT_AMBULATORY_CARE_PROVIDER_SITE_OTHER): Payer: Medicare Other | Admitting: Cardiovascular Disease

## 2019-12-04 VITALS — BP 120/60 | HR 79 | Ht 63.5 in | Wt 130.2 lb

## 2019-12-04 DIAGNOSIS — E782 Mixed hyperlipidemia: Secondary | ICD-10-CM | POA: Diagnosis not present

## 2019-12-04 DIAGNOSIS — Z951 Presence of aortocoronary bypass graft: Secondary | ICD-10-CM | POA: Diagnosis not present

## 2019-12-04 DIAGNOSIS — I10 Essential (primary) hypertension: Secondary | ICD-10-CM | POA: Diagnosis not present

## 2019-12-04 DIAGNOSIS — I700 Atherosclerosis of aorta: Secondary | ICD-10-CM

## 2019-12-04 DIAGNOSIS — N183 Chronic kidney disease, stage 3 unspecified: Secondary | ICD-10-CM | POA: Diagnosis not present

## 2019-12-04 DIAGNOSIS — I6523 Occlusion and stenosis of bilateral carotid arteries: Secondary | ICD-10-CM | POA: Diagnosis not present

## 2019-12-04 DIAGNOSIS — I25118 Atherosclerotic heart disease of native coronary artery with other forms of angina pectoris: Secondary | ICD-10-CM

## 2019-12-04 MED ORDER — RANOLAZINE ER 500 MG PO TB12: ORAL_TABLET | ORAL | 0 refills | Status: DC

## 2019-12-04 NOTE — Patient Instructions (Addendum)
We will talk to Dr. Derrel Nip about shortness of breath  Medication Instructions:  Your physician has recommended you make the following change in your medication:  1. START Ranexa 500 mg twice a day for one week then up to 1000 mg twice a day. Call before you run out for refill.    Please take your combivent for exertion  If you need a refill on your cardiac medications before your next appointment, please call your pharmacy.    Lab work: No new labs needed   If you have labs (blood work) drawn today and your tests are completely normal, you will receive your results only by: Marland Kitchen MyChart Message (if you have MyChart) OR . A paper copy in the mail If you have any lab test that is abnormal or we need to change your treatment, we will call you to review the results.   Testing/Procedures: Echocardiogram for shortness of breath Your physician has requested that you have an echocardiogram. Echocardiography is a painless test that uses sound waves to create images of your heart. It provides your doctor with information about the size and shape of your heart and how well your heart's chambers and valves are working. This procedure takes approximately one hour. There are no restrictions for this procedure.     Follow-Up: At [facility], you and your health needs are our priority.  As part of our continuing mission to provide you with exceptional heart care, we have created designated Provider Care Teams.  These Care Teams include your primary Cardiologist (physician) and Advanced Practice Providers (APPs -  Physician Assistants and Nurse Practitioners) who all work together to provide you with the care you need, when you need it.  . You will need a follow up appointment in 2 month   . Providers on your designated Care Team:   . Murray Hodgkins, NP . Christell Faith, PA-C . Marrianne Mood, PA-C  Any Other Special Instructions Will Be Listed Below (If Applicable).

## 2019-12-04 NOTE — Progress Notes (Signed)
Cardiology Office Note  Date:  12/04/2019   ID:  [name], DOB 1925/10/06, MRN [id number]  PCP:  [name], MD   Chief Complaint  Patient presents with  . other    Pt. c/o chest pain and shortness of breath at times. Meds reviewed by the pt. verbally.     HPI:  [name] is a pleasant 86 year old woman with a history of  CAD, bypass surgery in 1999, Stress test October 2017 with no ischemia COPD, Smoking for 40 years who quit in 1985,  chronic renal insufficiency,  hypertension,  hyperlipidemia,  40-50% bilateral carotid arterial disease  EF >60% in 05/2015  C. difficile in January 2015. H/o fall wet floor  02/2017, Suffered a pelvic fracture, left shoulder pain who presents for routine followup of her coronary artery disease  Seen October 21, 2019 for paroxysmal tachycardia Was having some left pectoral pain Chronic mild shortness of breath Tolerating Imdur twice a day Chronic problems with anxiety and insomnia  She presents today for add-on Reports having " weakness in her chest" when walking or exerting Continues to have reproducible left pectoral pain on palpation radiating into left shoulder, hurts if she raises her left arm -Bothered by the shortness of breath  Feels the shortness of breath symptoms have been worse the past 2 weeks Not using combivent No significant sputum, cough, no PND orthopnea Has been playing golf  No significant tachycardia on today's follow-up  Prior stress test reviewed with her September 2020, low risk  EKG personally reviewed by myself on todays visit Shows sinus bradycardia rate 79 bpm nonspecific ST-T wave abnormality precordial leads, inferior leads No significant change, perhaps slightly more pronounced  Other past medical history reviewed Stress test 02/2019 Pharmacological myocardial perfusion imaging study with no significant ischemia Unable to exclude mild ischemia lateral wall on non-attenuation corrected  images Normal wall motion, EF estimated at 56% EKG with equivocal ST abnormality V2 through V5 following infusion of lexiscan.  Patient did report chest pain following infusion. Rare PVC noted Moderate risk scan , secondary to equivocal EKG changes, chest pain during the study  Previously treated with chemotherapy for cancer on right lower extremity  Hospital admission 12/21/2016 for COPD exacerbation Syncope, dehydrated She received steroids,duo nebs, ABX   PMH:   has a past medical history of C. difficile colitis, CAD (coronary artery disease), Carotid artery disease ([place]), Closed fracture pubis ([place]) (07/04/2017), COPD (chronic obstructive pulmonary disease) ([place]), Diffuse cystic mastopathy, Diverticulitis, Dizziness, Gait abnormality, GERD (gastroesophageal reflux disease), History of migraines, CABG, Hyperlipidemia, Hypertension, Indigestion, Kidney stones, Rheumatic fever, Sinus bradycardia, SOB (shortness of breath), and Syncopal episodes.  PSH:    Past Surgical History:  Procedure Laterality Date  . APPENDECTOMY  1950  . BREAST BIOPSY Right 1994  . BREAST CYST ASPIRATION     multiple BIL  . CATARACT EXTRACTION Right 2009  . CATARACT EXTRACTION Left 2011  . COLONOSCOPY  [id number]   Dr. Jamal Collin  . CORONARY ARTERY BYPASS GRAFT  1999  . esophagus stretched    . TONSILLECTOMY  1939  . TOTAL ABDOMINAL HYSTERECTOMY  1968   due to metrorrhagia    Current Outpatient Medications  Medication Sig Dispense Refill  . amLODipine (NORVASC) 2.5 MG tablet Take 1 tablet (2.5 mg total) by mouth daily. 90 tablet 2  . atorvastatin (LIPITOR) 20 MG tablet Take 1 tablet (20 mg total) by mouth daily. 90 tablet 3  . clopidogrel (PLAVIX) 75 MG tablet Take 1 tablet (75  mg total) by mouth daily. 90 tablet 3  . escitalopram (LEXAPRO) 10 MG tablet One tablet by mouth daily after dinner 30 tablet 2  . esomeprazole (NEXIUM) 40 MG capsule TAKE 1 CAPSULE DAILY BEFORE BREAKFAST 90 capsule 1  .  Ipratropium-Albuterol (COMBIVENT RESPIMAT) 20-100 MCG/ACT AERS respimat Inhale 1 puff into the lungs every 6 (six) hours as needed for wheezing. 4 g 2  . isosorbide mononitrate (IMDUR) 60 MG 24 hr tablet Take 1 tablet (60 mg total) by mouth 2 (two) times daily. 180 tablet 3  . LORazepam (ATIVAN) 0.5 MG tablet TAKE ONE AND ONE-HALF TABLET BY MOUTH ASNEEDED FOR INSOMNIA 45 tablet 2  . losartan (COZAAR) 100 MG tablet TAKE ONE TABLET BY MOUTH EVERY DAY 90 tablet 3  . nitroGLYCERIN (NITROSTAT) 0.4 MG SL tablet PLACE 1 TABLET UNDER TONGUE EVERY 5 MIN AS NEEDED FOR CHEST PAIN IF NO RELIEF IN15 MIN CALL 911 (MAX 3 TABS) 25 tablet 1  . propranolol (INDERAL) 20 MG tablet TAKE ONE TABLET 3 TIMES DAILY AS NEEDED FOR TACHYCARDIA 30 tablet 1   No current facility-administered medications for this visit.     Allergies:   Codeine   Social History:  The patient  reports that she quit smoking about 36 years ago. Her smoking use included cigarettes. She has a 20.00 pack-year smoking history. She has never used smokeless tobacco. She reports previous alcohol use. She reports that she does not use drugs.   Family History:   family history includes Heart disease in her father and mother.    Review of Systems: Review of Systems  Constitutional: Negative.   HENT: Negative.   Respiratory: Positive for shortness of breath.   Cardiovascular: Positive for palpitations.  Gastrointestinal: Negative.   Musculoskeletal: Negative.   Neurological: Negative.   Psychiatric/Behavioral: Negative.   All other systems reviewed and are negative.   PHYSICAL EXAM: VS:  BP 120/60 (BP Location: Left Arm, Patient Position: Sitting, Cuff Size: Normal)   Pulse 79   Ht 5' 3.5" (1.613 m)   Wt 130 lb 4 oz (59.1 kg)   SpO2 97%   BMI 22.71 kg/m  , BMI Body mass index is 22.71 kg/m. Constitutional:  oriented to person, place, and time. No distress.  HENT:  Head: Grossly normal Eyes:  no discharge. No scleral icterus.  Neck:  No JVD, no carotid bruits  Cardiovascular: Regular rate and rhythm, no murmurs appreciated Pulmonary/Chest: Clear to auscultation bilaterally, no wheezes or rails Abdominal: Soft.  no distension.  no tenderness.  Musculoskeletal: Normal range of motion Neurological:  normal muscle tone. Coordination normal. No atrophy Skin: Skin warm and dry Psychiatric: normal affect, pleasant   Recent Labs: 07/08/2019: ALT 12; BUN 19; Creatinine, Ser 1.19; Hemoglobin 13.5; Platelets 187; Potassium 4.5; Sodium 141 07/11/2019: TSH 2.08    Lipid Panel Lab Results  Component Value Date   CHOL 120 04/02/2019   HDL 35.90 (L) 04/02/2019   LDLCALC 57 04/02/2019   TRIG 135.0 04/02/2019      Wt Readings from Last 3 Encounters:  12/04/19 130 lb 4 oz (59.1 kg)  10/21/19 128 lb 12 oz (58.4 kg)  07/11/19 125 lb 12.8 oz (57.1 kg)     ASSESSMENT AND PLAN:  Essential hypertension -  Blood pressure stable, no medication changes made  Coronary artery disease involving native coronary artery of native heart without angina pectoris -  Atypical left chest pain reproducible on palpation, likely musculoskeletal  Shortness of breath/weakness Etiology unclear, not taking her Combivent  Unable to exclude ischemia though she does not want repeat stress testing and does not want catheterization -Offered Ranexa for chronic stable angina, prescription sent in -Recommend she start her Combivent -Unclear if she needs more aggressive inhaler such as Symbicort, will defer to primary care -Echocardiogram ordered given last study was 2016 Unable to exclude pulmonary hypertension or drop in ejection fraction or even valvular heart disease  History of bypass surgery Plan as above, surgery 2006 Chronic shortness of breath, Plan as above for shortness of breath She really does not want cardiac catheterization " I am too old for that"  Carotid stenosis Less than 39% bilaterally, significant plaque noted Cholesterol  at goal  Hyperlipidemia On Lipitor 20 daily, goal LDL less than 70 Stable    Total encounter time more than 25 minutes  Greater than 50% was spent in counseling and coordination of care with the patient   Disposition:   F/U  12 months   Orders Placed This Encounter  Procedures  . EKG 12-Lead     Signed, Esmond Plants, M.D., Ph.D. 12/04/2019  [place], [place]

## 2019-12-09 ENCOUNTER — Other Ambulatory Visit: Payer: Self-pay | Admitting: *Deleted

## 2019-12-09 NOTE — Patient Outreach (Signed)
[place] [facility]) Care Management [place] Telephone Outreach Post-hospital discharge day #174 without unplanned hospital re-admission   12/09/2019  [name] [birth year] [id number]  11:05 am: Unsuccessful telephone outreach toBetty Allen, 86 y/o female, routine referred to [place] by [place] 06/17/2019 for self health management of CHF.Patient has history including, but not limited to, CHF; COPD; HTN/ HLD; CAD; CKD- III. Noted from review of EMR that patient experienced post- referral observationhospitalization September 28-29, 2020 for chest pain; patient was ruled out for ACS/ MI and was discharged home to self-care without home health services in place.  HIPAA compliant voice mail message left for patient, requesting return call back.  1:15 pm:  Patient returned my call; HIPAA/ identity verified. Patient reports "doing great" and she denies pain/ new recent falls and sounds to be in no distress throughout phone call today; reports she has been outside and been active and has been enjoying the nice weather.  She denies clinical concerns and sounds to be in no distress throughout phone call today.  Patient further reports:  -- no concernsaround medications; continuesself-managing medications and denies questions around current medications; confirms that she obtained and has newly prescribed Ranolazine post-cardiology provider appointment last week; reports adherence and is taking as prescribed; denies concerns around all of medications  -- attended recent cardiology and vision provider appointments on 12/04/19: reports both "went just fine" and we reviewed visits today ---- ECHOcardiogram scheduled for January 15, 2020 ---- patient has been driving self to provider office visits; confirms that her family are able to assist if needed  --hascontinueddaily weight monitoring/ recording, reviewedrecent weights at home;againreports daily  weight ranges "the same" between 130-132 lbsconsistentlywith weight this morning of "130 lbs." --reiterated weight gain guidelines/ corresponding action plan in setting of CHF: patient appreciates the reminder  --using teach back method,reiterated previously provided education around early/ late signs CHF exacerbation and purpose of weight monitoring at home- encouraged patient to promptly notify care providers for any development of early signs, which she denies today, stating no shortness of breath outside of baseline, and no swelling of lower extremities -- reports blood pressures "all good at home," andagaindeclines review of same, stating that "they are all fine." Continues monitoring blood pressures weekly and states that she shared her blood pressures with cardiologist during last office visit 12/04/19, and "he said they were all looking real good- he didn't have any concerns."  Patient denies further issues, concerns, or problems today, and Iconfirmed thatshehasmy direct phone number, the main [facility] CM office phone number, and the [facility] CM 24-hour nurse advice phone number should issues arise prior to next scheduled [facility] CM outreachin 6 weeks.Encouraged patient to contact me directly if needs, questions, issues, or concerns arise prior to next scheduled outreach; patient agreed to do so.  Plan:  Patient will take medications as prescribed and will attend all scheduled provider appointments  Patient will promptly notify care providers for any new concerns/ issues/ problems that arise  Patient will continue monitoring/andrecording daily weightsat homeand will follow action plan for weight gain guidelines in setting of CHF  [facility] CM outreach to continue with scheduled phone callin 6 weekspossibly for transfer to [place] Problem Two     Most Recent Value  Care Plan Problem Two  Need for ongoing reinforcement around  self-health management of chronic disease state of CHF, as evidenced by patient reporting   Role Documenting the  Problem Two  Care Management Coordinator  Care Plan for Problem Two  Active  Interventions for Problem Two Long Term Goal   Discussed current clinical condition with patient and confirmed that patient continues monitoring and recording daily weights and blood pressures at home and reviewed recent home values with patient,  using teach back method, reiterated previously provided education around purpose of daily weights and weight gain guidelines in setting of CHF,  confirmed that patient continues monitoring and recording blood pressures at home  [place] Goal  Over the next 90 days, patient will contiue monitoring and recording daily weights at home and will promptly contact care providers for any new concerns that arise, as evidenced by patient reporting and review of same during [facility] RN CM outreach  [place] Term Goal Start Date  10/31/19     Oneta Rack, RN, BSN, [place] Coordinator [facility] Care Management  [phone]

## 2019-12-11 DIAGNOSIS — D0472 Carcinoma in situ of skin of left lower limb, including hip: Secondary | ICD-10-CM | POA: Diagnosis not present

## 2019-12-11 DIAGNOSIS — L814 Other melanin hyperpigmentation: Secondary | ICD-10-CM | POA: Diagnosis not present

## 2019-12-11 DIAGNOSIS — L570 Actinic keratosis: Secondary | ICD-10-CM | POA: Diagnosis not present

## 2019-12-11 DIAGNOSIS — D485 Neoplasm of uncertain behavior of skin: Secondary | ICD-10-CM | POA: Diagnosis not present

## 2019-12-11 DIAGNOSIS — X32XXXA Exposure to sunlight, initial encounter: Secondary | ICD-10-CM | POA: Diagnosis not present

## 2019-12-13 DIAGNOSIS — D0472 Carcinoma in situ of skin of left lower limb, including hip: Secondary | ICD-10-CM | POA: Diagnosis not present

## 2019-12-13 DIAGNOSIS — L570 Actinic keratosis: Secondary | ICD-10-CM | POA: Diagnosis not present

## 2019-12-20 ENCOUNTER — Telehealth: Payer: Self-pay | Admitting: Cardiovascular Disease

## 2019-12-20 NOTE — Telephone Encounter (Signed)
Spoke with the patient. Patient sts that she was instructed to increase her Ranexa to 1000mg  bid after being on the 500mg  bid for 2 weeks. Patient stst that yesterday after going up on the dosage she began to experience weakness and dizziness when standing and with ambulation. Patient sts that she dis not sleep last night, but could not give a specific reason why. She did not check her VS during the weakness/ dizziness episodes.  She did take Ranexa 100 mg this morning.  Patient did tolerate the 500mg  bid dosing. Adv the patient to resume the 500 mg bid for now. An update will be fwd to Dr. Rockey Situ for further advice. Advised the patient to call back in the interim if symptoms reoccur. Patient agreeable with the plan and voiced appreciation for the call back.

## 2019-12-20 NOTE — Telephone Encounter (Signed)
Pt c/o medication issue:  1. Name of Medication: Ranalazine  2. How are you currently taking this medication (dosage and times per day)? 2 tablets (1,000mg  total) twice a day  Am and pm   3. Are you having a reaction (difficulty breathing--STAT)?   4. What is your medication issue? Started yesterday taking 2 tablets twice a day and has been so weak she cannot walk( gets dizzy when stands up) and didn't sleep at all last night

## 2019-12-21 NOTE — Telephone Encounter (Signed)
Okay to stay on the Ranexa 500 twice daily This was for chronic stable angina

## 2019-12-23 MED ORDER — RANOLAZINE ER 500 MG PO TB12: 500.0000 mg | ORAL_TABLET | Freq: Two times a day (BID) | ORAL | 3 refills | Status: DC

## 2019-12-23 NOTE — Telephone Encounter (Signed)
Spoke with patient and reviewed providers recommendations to take Ranexa 500 mg twice a day. Instructed her to call back if she should have any further symptoms or concerns. She was appreciative for the call with no further questions at this time.

## 2019-12-23 NOTE — Telephone Encounter (Signed)
Call attempted. Line busy. 

## 2019-12-26 ENCOUNTER — Telehealth: Payer: Self-pay | Admitting: Internal Medicine

## 2019-12-26 NOTE — Telephone Encounter (Signed)
Notified patient daughter of MD advice and daufghter will monitor patient and do as advised.

## 2019-12-26 NOTE — Telephone Encounter (Signed)
Patient has felt very weak X 2 days, patient has been eating and drinking normal, but her daughter says this happens when she is dehydrated, said she played golf last week and daughter thinks she is just getting dehydrated like before she is giving her Pedialyte and pushing fluids and is going to take patient to either UC or ER if this does not help for IV fluids but wanted to know if PCP wanted her to try anything else for dehydration.

## 2019-12-26 NOTE — Telephone Encounter (Signed)
No other recommendations regarding fluids: Pedialyte ok  Take to UC  If her  BP is < 100/60 or > 160/90 ;  or  ER if she develops shortness of breath or chest pain more than her usual

## 2019-12-28 ENCOUNTER — Other Ambulatory Visit: Payer: Self-pay | Admitting: Internal Medicine

## 2019-12-30 ENCOUNTER — Emergency Department: Payer: Medicare Other

## 2019-12-30 ENCOUNTER — Other Ambulatory Visit: Payer: Self-pay

## 2019-12-30 ENCOUNTER — Emergency Department
Admission: EM | Admit: 2019-12-30 | Discharge: 2019-12-30 | Disposition: A | Discharge status: Home / Self Care | Payer: Medicare Other | Attending: Emergency Medicine | Admitting: Emergency Medicine

## 2019-12-30 DIAGNOSIS — R531 Weakness: Secondary | ICD-10-CM | POA: Diagnosis not present

## 2019-12-30 DIAGNOSIS — Z79899 Other long term (current) drug therapy: Secondary | ICD-10-CM | POA: Diagnosis not present

## 2019-12-30 DIAGNOSIS — R0902 Hypoxemia: Secondary | ICD-10-CM | POA: Diagnosis not present

## 2019-12-30 DIAGNOSIS — I5032 Chronic diastolic (congestive) heart failure: Secondary | ICD-10-CM | POA: Diagnosis not present

## 2019-12-30 DIAGNOSIS — J441 Chronic obstructive pulmonary disease with (acute) exacerbation: Secondary | ICD-10-CM | POA: Diagnosis not present

## 2019-12-30 DIAGNOSIS — E860 Dehydration: Secondary | ICD-10-CM

## 2019-12-30 DIAGNOSIS — Z7902 Long term (current) use of antithrombotics/antiplatelets: Secondary | ICD-10-CM | POA: Insufficient documentation

## 2019-12-30 DIAGNOSIS — N183 Chronic kidney disease, stage 3 unspecified: Secondary | ICD-10-CM | POA: Diagnosis not present

## 2019-12-30 DIAGNOSIS — I2510 Atherosclerotic heart disease of native coronary artery without angina pectoris: Secondary | ICD-10-CM | POA: Diagnosis not present

## 2019-12-30 DIAGNOSIS — R5383 Other fatigue: Secondary | ICD-10-CM | POA: Diagnosis not present

## 2019-12-30 DIAGNOSIS — I10 Essential (primary) hypertension: Secondary | ICD-10-CM | POA: Diagnosis not present

## 2019-12-30 DIAGNOSIS — R001 Bradycardia, unspecified: Secondary | ICD-10-CM | POA: Diagnosis not present

## 2019-12-30 DIAGNOSIS — I130 Hypertensive heart and chronic kidney disease with heart failure and stage 1 through stage 4 chronic kidney disease, or unspecified chronic kidney disease: Secondary | ICD-10-CM | POA: Diagnosis not present

## 2019-12-30 DIAGNOSIS — R42 Dizziness and giddiness: Secondary | ICD-10-CM | POA: Diagnosis present

## 2019-12-30 LAB — CBC
HCT: 40.8 % (ref 36.0–46.0)
Hemoglobin: 13.2 g/dL (ref 12.0–15.0)
MCH: 32 pg (ref 26.0–34.0)
MCHC: 32.4 g/dL (ref 30.0–36.0)
MCV: 98.8 fL (ref 80.0–100.0)
Platelets: 226 10*3/uL (ref 150–400)
RBC: 4.13 MIL/uL (ref 3.87–5.11)
RDW: 11.9 % (ref 11.5–15.5)
WBC: 9.9 10*3/uL (ref 4.0–10.5)
nRBC: 0 % (ref 0.0–0.2)

## 2019-12-30 LAB — BASIC METABOLIC PANEL
Anion gap: 9 (ref 5–15)
BUN: 22 mg/dL (ref 8–23)
CO2: 24 mmol/L (ref 22–32)
Calcium: 8.6 mg/dL — ABNORMAL LOW (ref 8.9–10.3)
Chloride: 101 mmol/L (ref 98–111)
Creatinine, Ser: 1.48 mg/dL — ABNORMAL HIGH (ref 0.44–1.00)
GFR calc Af Amer: 36 mL/min — ABNORMAL LOW (ref >60)
GFR calc non Af Amer: 31 mL/min — ABNORMAL LOW (ref >60)
Glucose, Bld: 106 mg/dL — ABNORMAL HIGH (ref 70–99)
Potassium: 4.6 mmol/L (ref 3.5–5.1)
Sodium: 134 mmol/L — ABNORMAL LOW (ref 135–145)

## 2019-12-30 LAB — URINALYSIS, COMPLETE (UACMP) WITH MICROSCOPIC
Bilirubin Urine: NEGATIVE
Glucose, UA: NEGATIVE mg/dL
Hgb urine dipstick: NEGATIVE
Ketones, ur: NEGATIVE mg/dL
Nitrite: NEGATIVE
Protein, ur: NEGATIVE mg/dL
Specific Gravity, Urine: 1.006 (ref 1.005–1.030)
pH: 6 (ref 5.0–8.0)

## 2019-12-30 LAB — BRAIN NATRIURETIC PEPTIDE: B Natriuretic Peptide: 144 pg/mL — ABNORMAL HIGH (ref 0.0–100.0)

## 2019-12-30 LAB — TROPONIN I (HIGH SENSITIVITY): Troponin I (High Sensitivity): 7 ng/L (ref <18)

## 2019-12-30 MED ORDER — PREDNISONE 20 MG PO TABS: 20.0000 mg | ORAL_TABLET | Freq: Every day | ORAL | 0 refills | Status: AC

## 2019-12-30 MED ORDER — IPRATROPIUM-ALBUTEROL 0.5-2.5 (3) MG/3ML IN SOLN
3.0000 mL | Freq: Once | RESPIRATORY_TRACT | Status: AC
  Administered 2019-12-30: 3 mL via RESPIRATORY_TRACT
  Filled 2019-12-30: qty 3

## 2019-12-30 MED ORDER — LACTATED RINGERS IV BOLUS
1000.0000 mL | Freq: Once | INTRAVENOUS | Status: AC
  Administered 2019-12-30: 1000 mL via INTRAVENOUS

## 2019-12-30 MED ORDER — ALBUTEROL SULFATE HFA 108 (90 BASE) MCG/ACT IN AERS: 2.0000 | INHALATION_SPRAY | Freq: Four times a day (QID) | RESPIRATORY_TRACT | 0 refills | Status: DC | PRN

## 2019-12-30 NOTE — Discharge Instructions (Addendum)
For your breathing: - Use your Combivent inhaler twice a day for the next 5 days, then daily during pollen season to help with breathing - I have also given you an ALBUTEROL inhaler. This can be used up to 4-5 x daily as needed for wheezing or shortness of breath. You can use this IN ADDITION to your Combivent. - Start taking the prednisone. I have prescribed a low dose for 3 days.  - If you have recurrence of your shortness of breath after prednisone, call your doctor to set up follow-up.  For your hydration: - Try to drink at least 6-8 glasses of water daily - Eat frequent, small meals - I'd recommend eating fruit or other foods high in water content to help with hydration as well

## 2019-12-30 NOTE — ED Notes (Signed)
This RN to bedside, pt assisted to the bathroom by this RN. UA collected by this RN at this time.

## 2019-12-30 NOTE — ED Triage Notes (Signed)
pt arrives via ems from home with c/o dizziness and generalized weakness x 3 days. ems reports pt noticed urine was darker color.  ems reports no orthostatic changes with them. Pt a&o x 4 on arrival, NAD noted at this time   147/86 HR 83  96% [id number]

## 2019-12-30 NOTE — ED Provider Notes (Signed)
[facility] Emergency Department Provider Note  ____________________________________________   First MD Initiated Contact with Patient 12/30/19 1034     (approximate)  I have reviewed the triage vital signs and the nursing notes.   HISTORY  Chief Complaint Dizziness and Fatigue    HPI [name] is a 86 y.o. female with past medical history as below including hypertension, hyperlipidemia, coronary disease, recurrent syncope, here with weakness.  The patient states that over the last several days, she has had progressively worsening generalized weakness.  She has a history of similar episodes when she gets dehydrated, and thought that she was dehydrated because she had not been drinking as much and had been outside golfing with friends, which she does regularly.  She has been trying to drink as much water as usual.  She states she had a fairly good day yesterday, but when she awoke this morning, she continued to feel incredibly "drained."  She denies any associated pain with this.  Denies any nausea or vomiting.  Of note, she did recently start a medication for angina 2 weeks ago, but states she has been decreasing the dose of this and did not have any issues when she first started it.  This was prescribed by her cardiologist.  No other acute complaints.        Past Medical History:  Diagnosis Date  . C. difficile colitis   . CAD (coronary artery disease)    a. Nuclear 10/11, not gated, no scar or ischemia, EF 68%, low risk study; b. [place] 06/18/14: no evidence of infarction or ischemia, no WMA, normalization of TWI in precordial leads, equivocal EKG changes EF 77%, low risk study  . Carotid artery disease ([place])    Doppler January, 2012, 40-59% bilateral, followup one year  . Closed fracture pubis ([place]) 07/04/2017  . COPD (chronic obstructive pulmonary disease) ([place])   . Diffuse cystic mastopathy   . Diverticulitis    last colonoscopy   incomplete Jan 2011  . Dizziness    stable now (Oct 2011) patient tells me question of TIA, we will obtain records  . Gait abnormality    Patient complains of "wobbly gait", December, 2013  . GERD (gastroesophageal reflux disease)   . History of migraines   . Hx of CABG    a. 3 vessel in 1999  . Hyperlipidemia   . Hypertension   . Indigestion    3 weeks, October 2011  . Kidney stones   . Rheumatic fever   . Sinus bradycardia    Mild, December, 2013  . SOB (shortness of breath)   . Syncopal episodes    after 18 holes of golf and increase hydrochlorothiazide, resolved     Patient Active Problem List   Diagnosis Date Noted  . Generalized weakness 07/14/2019  . Cellulitis of left lower leg 07/06/2019  . Chest pain with high risk of acute coronary syndrome 06/18/2019  . Mild malnutrition ([place]) 03/25/2019  . Diastolic CHF, chronic ([place]) 03/25/2019  . Vitamin D deficiency 03/25/2019  . Melena 02/03/2018  . Hearing loss secondary to cerumen impaction 12/26/2017  . S/P excision of skin lesion, follow-up exam 12/26/2017  . Impingement syndrome of left shoulder region 07/04/2017  . Neck pain 07/04/2017  . Allergic rhinitis 12/31/2016  . Hospital discharge follow-up 12/31/2016  . Change in bowel movement 08/20/2016  . SCC (squamous cell carcinoma), face 08/01/2016  . EKG abnormalities 06/17/2016  . Osteoporosis of forearm 02/13/2016  . Dizziness and  giddiness 02/13/2016  . Other specified symptoms and signs involving the circulatory and respiratory systems 01/31/2016  . Insomnia secondary to anxiety 07/12/2015  . Anxiety disorder 07/12/2015  . Essential (primary) hypertension 10/07/2014  . Syncope and collapse   . Sinus bradycardia   . Hardening of the aorta (main artery of the heart) ([place]) 06/26/2014  . History of Clostridium difficile colitis 10/20/2013  . Personal history of other diseases of the digestive system 10/20/2013  . Low back pain 10/04/2013  . Encounter for  general adult medical examination without abnormal findings 02/07/2013  . Chest pain at rest 12/26/2011  . COPD, moderate ([place]) 12/05/2011  . Presence of aortocoronary bypass graft   . Chronic kidney disease, stage III (moderate) 09/25/2011  . Hyperlipidemia   . Hypertension   . SOB (shortness of breath)   . Coronary artery disease of native artery of native heart with stable angina pectoris ([place])   . Carotid artery disease [facility])     Past Surgical History:  Procedure Laterality Date  . APPENDECTOMY  1950  . BREAST BIOPSY Right 1994  . BREAST CYST ASPIRATION     multiple BIL  . CATARACT EXTRACTION Right 2009  . CATARACT EXTRACTION Left 2011  . COLONOSCOPY  [id number]   Dr. Jamal Collin  . CORONARY ARTERY BYPASS GRAFT  1999  . esophagus stretched    . TONSILLECTOMY  1939  . TOTAL ABDOMINAL HYSTERECTOMY  1968   due to metrorrhagia    Prior to Admission medications   Medication Sig Start Date End Date Taking? Authorizing Provider  amLODipine (NORVASC) 2.5 MG tablet Take 1 tablet (2.5 mg total) by mouth daily. 10/21/19  Yes Minna Merritts, MD  atorvastatin (LIPITOR) 20 MG tablet Take 1 tablet (20 mg total) by mouth daily. 07/23/19  Yes [name], MD  clopidogrel (PLAVIX) 75 MG tablet Take 1 tablet (75 mg total) by mouth daily. 10/21/19  Yes Minna Merritts, MD  isosorbide mononitrate (IMDUR) 60 MG 24 hr tablet Take 1 tablet (60 mg total) by mouth 2 (two) times daily. 10/21/19  Yes Gollan, Kathlene November, MD  LORazepam (ATIVAN) 0.5 MG tablet TAKE ONE AND ONE-HALF TABLET BY MOUTH ASNEEDED FOR INSOMNIA Patient taking differently: Take 0.75 mg by mouth at bedtime as needed for sleep.  10/09/19  Yes [name], MD  losartan (COZAAR) 100 MG tablet TAKE ONE TABLET BY MOUTH EVERY DAY Patient taking differently: Take 100 mg by mouth daily.  10/21/19  Yes [name], MD  nitroGLYCERIN (NITROSTAT) 0.4 MG SL tablet PLACE 1 TABLET UNDER TONGUE EVERY 5 MIN AS NEEDED FOR CHEST PAIN IF NO RELIEF  IN15 MIN CALL 911 (MAX 3 TABS) Patient taking differently: Place 0.4 mg under the tongue every 5 (five) minutes as needed for chest pain.  11/04/19  Yes Gollan, Kathlene November, MD  propranolol (INDERAL) 20 MG tablet TAKE ONE TABLET 3 TIMES DAILY AS NEEDED FOR TACHYCARDIA Patient taking differently: Take 20 mg by mouth 3 (three) times daily as needed (tachycardia).  11/05/19  Yes Minna Merritts, MD  ranolazine (RANEXA) 500 MG 12 hr tablet Take 1 tablet (500 mg total) by mouth 2 (two) times daily. 12/23/19  Yes Gollan, Kathlene November, MD  albuterol (VENTOLIN HFA) 108 (90 Base) MCG/ACT inhaler Inhale 2 puffs into the lungs every 6 (six) hours as needed for wheezing or shortness of breath. 12/30/19   Duffy Bruce, MD  predniSONE (DELTASONE) 20 MG tablet Take 1 tablet (20 mg total) by mouth  daily for 3 days. 12/30/19 01/02/20  Duffy Bruce, MD    Allergies Codeine  Family History  Problem Relation Age of Onset  . Heart disease Mother   . Heart disease Father   . Cancer Neg Hx   . Drug abuse Neg Hx   . Breast cancer Neg Hx     Social History Social History   Tobacco Use  . Smoking status: Former Smoker    Packs/day: 0.50    Years: 40.00    Pack years: 20.00    Types: Cigarettes    Quit date: 09/20/1983    Years since quitting: 36.3  . Smokeless tobacco: Never Used  Substance Use Topics  . Alcohol use: Not Currently    Comment: yes, social wine  . Drug use: No    Review of Systems  Review of Systems  Constitutional: Positive for fatigue. Negative for fever.  HENT: Negative for congestion and sore throat.   Eyes: Negative for visual disturbance.  Respiratory: Negative for cough and shortness of breath.   Cardiovascular: Negative for chest pain.  Gastrointestinal: Negative for abdominal pain, diarrhea, nausea and vomiting.  Genitourinary: Negative for flank pain.  Musculoskeletal: Negative for back pain and neck pain.  Skin: Negative for rash and wound.  Neurological: Positive for  weakness.  All other systems reviewed and are negative.    ____________________________________________  PHYSICAL EXAM:      VITAL SIGNS: ED Triage Vitals  Enc Vitals Group     BP 12/30/19 1049 137/67     Pulse Rate 12/30/19 1047 76     Resp 12/30/19 1047 18     Temp 12/30/19 1047 98.3 F (36.8 C)     Temp Source 12/30/19 1047 Oral     SpO2 12/30/19 1047 98 %     Weight 12/30/19 1046 130 lb (59 kg)     Height 12/30/19 1046 5\' 3"  (1.6 m)     Head Circumference --      Peak Flow --      Pain Score 12/30/19 1047 6     Pain Loc --      Pain Edu? --      Excl. in [place]? --      Physical Exam Vitals and nursing note reviewed.  Constitutional:      General: She is not in acute distress.    Appearance: She is well-developed.  HENT:     Head: Normocephalic and atraumatic.  Eyes:     Conjunctiva/sclera: Conjunctivae normal.  Cardiovascular:     Rate and Rhythm: Normal rate and regular rhythm.     Heart sounds: Normal heart sounds. No murmur. No friction rub.  Pulmonary:     Effort: Pulmonary effort is normal. No respiratory distress.     Breath sounds: Wheezing present. No rales.  Abdominal:     General: There is no distension.     Palpations: Abdomen is soft.     Tenderness: There is no abdominal tenderness.  Musculoskeletal:     Cervical back: Neck supple.  Skin:    General: Skin is warm.     Capillary Refill: Capillary refill takes less than 2 seconds.  Neurological:     Mental Status: She is alert and oriented to person, place, and time.     Motor: No abnormal muscle tone.       ____________________________________________   LABS (all labs ordered are listed, but only abnormal results are displayed)  Labs Reviewed  URINALYSIS, COMPLETE (UACMP) WITH MICROSCOPIC - Abnormal;  Notable for the following components:      Result Value   Color, Urine YELLOW (*)    APPearance CLEAR (*)    Leukocytes,Ua TRACE (*)    Bacteria, UA RARE (*)    All other components  within normal limits  BRAIN NATRIURETIC PEPTIDE - Abnormal; Notable for the following components:   B Natriuretic Peptide 144.0 (*)    All other components within normal limits  BASIC METABOLIC PANEL - Abnormal; Notable for the following components:   Sodium 134 (*)    Glucose, Bld 106 (*)    Creatinine, Ser 1.48 (*)    Calcium 8.6 (*)    GFR calc non Af Amer 31 (*)    GFR calc Af Amer 36 (*)    All other components within normal limits  CBC  CBG MONITORING, ED  TROPONIN I (HIGH SENSITIVITY)    ____________________________________________  EKG: Normal sinus rhythm, ventricular rate 71.  QRS 78, QTc 508.  No acute ST elevations or depressions. _____________________________  [id number] All imaging, including plain films, CT scans, and ultrasounds, independently reviewed by me, and interpretations confirmed via formal radiology reads.  ED MD interpretation:   Chest x-ray: Clear  Official radiology report(s): DG Chest 2 View  Result Date: 12/30/2019 CLINICAL DATA:  Weakness and dizziness EXAM: CHEST - 2 VIEW COMPARISON:  June 18, 2019 FINDINGS: Lungs are clear. Heart size and pulmonary vascularity are normal. Patient is status post coronary artery bypass grafting. There is aortic atherosclerosis. There also foci calcification in each subclavian artery. No adenopathy. 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  Bones are osteoporotic. IMPRESSION: Lungs clear. Cardiac silhouette within normal limits. Postoperative changes noted. Sizable paraesophageal hernia noted. Aortic Atherosclerosis (ICD10-I70.0).  Bones osteoporotic. Electronically Signed   By: Lowella Grip III M.D.   On: 12/30/2019 11:43    ____________________________________________  PROCEDURES   Procedure(s) performed (including Critical Care):  .1-3 Lead EKG Interpretation Performed by: Duffy Bruce, MD Authorized by: Duffy Bruce, MD     Interpretation: normal     ECG rate assessment: normal      Ectopy: none     Conduction: abnormal   Comments:     Indication: General weakness, receiving nebulizer treatments    ____________________________________________  INITIAL IMPRESSION / MDM / ASSESSMENT AND PLAN / ED COURSE  As part of my medical decision making, I reviewed the following data within the [place] notes reviewed and incorporated, Old chart reviewed, Notes from prior ED visits, and [place] Controlled Substance Database       *[name] was evaluated in Emergency Department on 12/30/2019 for the symptoms described in the history of present illness. She was evaluated in the context of the global COVID-19 pandemic, which necessitated consideration that the patient might be at risk for infection with the SARS-CoV-2 virus that causes COVID-19. Institutional protocols and algorithms that pertain to the evaluation of patients at risk for COVID-19 are in a state of rapid change based on information released by regulatory bodies including the CDC and federal and state organizations. These policies and algorithms were followed during the patient's care in the ED.  Some ED evaluations and interventions may be delayed as a result of limited staffing during the pandemic.*     Medical Decision Making:  86 yo F here with generalized weakness. On exam, she appears mildly dehydrated but otherwise remarkably well, in no distress. Mild diffuse wheezing noted. Labs, imaging as above. Pt has mild elevated Cr likely 2/2  dehydration - she admits to chronically poor PO intake and has had similar presentations in setting of dehydration in past. Gentle fluids given. BNP slightly elevated but she has no murmur, edema, or signs of CHF clinically. Otherwise, she does report a primary c/o SOB and wheezing and she has mild wheezing on exam. She has not been using her combivent inhaler. Trialed duonebs with significant improvement in WOB and resolution of wheezing on exam. CXR is clear,  no signs of PNA. EKG nonischemic and trop neg despite sx for days - doubt ischemic etiology.  Suspect fatigue 2/2 combination of mild dehydration and COPD exacerbation due to local pollen counts. Given her age and sensitivity to medication in the past, will rx low-dose prednisone x 3 days and have her increase her combivent to BID for a few days. Albuterol rescue inhaler prescribed as well. Otherwise, no signs of occult infection, ischemia, or other emergent pathology. D/c with family. ____________________________________________  FINAL CLINICAL IMPRESSION(S) / ED DIAGNOSES  Final diagnoses:  Dehydration  COPD exacerbation ([place])     MEDICATIONS GIVEN DURING THIS VISIT:  Medications  lactated ringers bolus 1,000 mL (0 mLs Intravenous Stopped 12/30/19 1507)  ipratropium-albuterol (DUONEB) 0.5-2.5 (3) MG/3ML nebulizer solution 3 mL (3 mLs Nebulization Given 12/30/19 1316)  ipratropium-albuterol (DUONEB) 0.5-2.5 (3) MG/3ML nebulizer solution 3 mL (3 mLs Nebulization Given 12/30/19 1514)     ED Discharge Orders         Ordered    predniSONE (DELTASONE) 20 MG tablet  Daily     12/30/19 1442    albuterol (VENTOLIN HFA) 108 (90 Base) MCG/ACT inhaler  Every 6 hours PRN     12/30/19 1446           Note:  This document was prepared using Dragon voice recognition software and may include unintentional dictation errors.   Duffy Bruce, MD 12/30/19 1742

## 2019-12-30 NOTE — ED Notes (Signed)
This RN to bedside at this time. Introduced self to patient and daughter. Pt visualized in NAD at this time. Pt asking for water at this time. Explained will speak with MD. Pt taken to X-ray at this time.

## 2019-12-31 NOTE — Telephone Encounter (Signed)
Refill for 30 days only.  OFFICE VISIT NEEDED prior to any more refills for controlled substance (lorazepam)

## 2020-01-03 ENCOUNTER — Other Ambulatory Visit: Payer: Self-pay | Admitting: Cardiovascular Disease

## 2020-01-03 DIAGNOSIS — R0600 Dyspnea, unspecified: Secondary | ICD-10-CM

## 2020-01-07 ENCOUNTER — Telehealth: Payer: Self-pay | Admitting: Cardiovascular Disease

## 2020-01-07 NOTE — Telephone Encounter (Signed)
Spoke with patient and reviewed her medication. She became upset thinking that she took it incorrectly and that is why she may have gotten dizzy. She was audibly upset over this but just is not sure what happened. Instructed her to only take one tablet daily for now and then recommended that she come in to see one of our APP providers to review her medications and see what is working for her. She was so apologetic for this error and provided her with emotional support and that no need for apologies. Scheduled her to see [name] on Thursday and will also discuss with Dr. Rockey Situ. She verbalized understanding with no further questions at this time.

## 2020-01-07 NOTE — Telephone Encounter (Signed)
Please call to discuss Ranolazine.  Pt c/o medication issue:  1. Name of Medication: Ranolazine  2. How are you currently taking this medication (dosage and times per day)? 500mg  2 times a day  3. Are you having a reaction (difficulty breathing--STAT)? dizziness  4. What is your medication issue?

## 2020-01-07 NOTE — Telephone Encounter (Signed)
Spoke with Dr. Rockey Situ about patient and medication. She was started on this for chronic chest pain to see if it would help. I think she may have gotten mixed up on the dosing but maybe we could suggest pill box, pill pack, or some other way of medication administration to assist her with this. Will make [facility] aware for upcoming appointment.

## 2020-01-08 ENCOUNTER — Encounter: Payer: Self-pay | Admitting: Internal Medicine

## 2020-01-08 ENCOUNTER — Other Ambulatory Visit: Payer: Self-pay

## 2020-01-08 ENCOUNTER — Ambulatory Visit (INDEPENDENT_AMBULATORY_CARE_PROVIDER_SITE_OTHER): Payer: Medicare Other | Admitting: Internal Medicine

## 2020-01-08 VITALS — BP 136/68 | HR 97 | Temp 97.8°F | Resp 15 | Ht 63.0 in | Wt 124.2 lb

## 2020-01-08 DIAGNOSIS — I5032 Chronic diastolic (congestive) heart failure: Secondary | ICD-10-CM

## 2020-01-08 DIAGNOSIS — R0789 Other chest pain: Secondary | ICD-10-CM

## 2020-01-08 DIAGNOSIS — R5383 Other fatigue: Secondary | ICD-10-CM | POA: Diagnosis not present

## 2020-01-08 DIAGNOSIS — I6523 Occlusion and stenosis of bilateral carotid arteries: Secondary | ICD-10-CM | POA: Diagnosis not present

## 2020-01-08 DIAGNOSIS — R531 Weakness: Secondary | ICD-10-CM | POA: Diagnosis not present

## 2020-01-08 DIAGNOSIS — E538 Deficiency of other specified B group vitamins: Secondary | ICD-10-CM

## 2020-01-08 DIAGNOSIS — E441 Mild protein-calorie malnutrition: Secondary | ICD-10-CM

## 2020-01-08 DIAGNOSIS — J9601 Acute respiratory failure with hypoxia: Secondary | ICD-10-CM

## 2020-01-08 DIAGNOSIS — R944 Abnormal results of kidney function studies: Secondary | ICD-10-CM | POA: Diagnosis not present

## 2020-01-08 DIAGNOSIS — R630 Anorexia: Secondary | ICD-10-CM | POA: Diagnosis not present

## 2020-01-08 LAB — BASIC METABOLIC PANEL
BUN: 20 mg/dL (ref 6–23)
CO2: 27 mEq/L (ref 19–32)
Calcium: 8.8 mg/dL (ref 8.4–10.5)
Chloride: 102 mEq/L (ref 96–112)
Creatinine, Ser: 1.26 mg/dL — ABNORMAL HIGH (ref 0.40–1.20)
GFR: 39.77 mL/min — ABNORMAL LOW (ref >60.00)
Glucose, Bld: 109 mg/dL — ABNORMAL HIGH (ref 70–99)
Potassium: 4.4 mEq/L (ref 3.5–5.1)
Sodium: 138 mEq/L (ref 135–145)

## 2020-01-08 LAB — B12 AND FOLATE PANEL
Folate: 13.1 ng/mL (ref >5.9)
Vitamin B-12: 215 pg/mL (ref 211–911)

## 2020-01-08 LAB — TSH: TSH: 1.64 u[IU]/mL (ref 0.35–4.50)

## 2020-01-08 MED ORDER — MIRTAZAPINE 7.5 MG PO TABS: 7.5000 mg | ORAL_TABLET | Freq: Every day | ORAL | 2 refills | Status: DC

## 2020-01-08 NOTE — Progress Notes (Signed)
Subjective:  Patient ID: [name], female    DOB: Sep 26, 1925  Age: 86 y.o. MRN: [id number]  CC: The primary encounter diagnosis was Fatigue, unspecified type. Diagnoses of Decreased GFR, B12 deficiency, Generalized weakness, Mild malnutrition ([place]), Left-sided chest wall pain, Anorexia, Acute respiratory failure with hypoxia ([place]), and Diastolic CHF, chronic ([place]) were also pertinent to this visit.  HPI [name] presents for follow up .  Last seen October 2020 following a hospitalization  She is a 86 yr old woman with a history of CAD, bypass surgery in 1999, EF > 60% by [facility] in 2016, Stress test October 2017 with no ischemia, COPD, Smoking for 40 years who quit in 1985, chronic renal insufficiency, hypertension, hyperlipidemia, PAD with 40-50% bilateral carotid arterial disease , C. difficile in January 2015, H/o fall wet floor  02/2017, Suffered a pelvic fracture, left shoulder pain  This visit occurred during the SARS-CoV-2 public health emergency.  Safety protocols were in place, including screening questions prior to the visit, additional usage of staff PPE, and extensive cleaning of exam room while observing appropriate contact time as indicated for disinfecting solutions.    Patient has received both doses of the available COVID 19 vaccine without complications.  Patient continues to mask when outside of the home except when walking in yard or at safe distances from others .  Patient denies any change in mood or development of unhealthy behaviors resuting from the pandemic's restriction of activities and socialization.    1) Treated in ED on April 12 for dehydration and COPD exacerbation. .  No UTI, negative troponin, BNP.  CXR normal.   treated and released.  Symptoms began after playing golf.  Received  prednisone x 3 days and iv fluids. Cr down and sodium down .  NO ANTIBIOTIC GIVEN,  DUE TO HISTORY OF c dif .    2) States that she continues  to feel excessively fatigued  with minimal activity.  Does not exercise regularly.  Feels generally weak.   3) Left shoulder and left side pain       4) medication overdose :  took an increased/ incorrect dose of Ranexa (started on March) and developed weakness   so daughter took her to the ER.   Had reduced medication to once daily for the last 30 days.  sees gollan/cardiology tomorrow  5) History of c dif colitis:  Bowels are moving and formed.      Outpatient Medications Prior to Visit  Medication Sig Dispense Refill  . albuterol (VENTOLIN HFA) 108 (90 Base) MCG/ACT inhaler Inhale 2 puffs into the lungs every 6 (six) hours as needed for wheezing or shortness of breath. 6.7 g 0  . amLODipine (NORVASC) 2.5 MG tablet Take 1 tablet (2.5 mg total) by mouth daily. 90 tablet 2  . atorvastatin (LIPITOR) 20 MG tablet Take 1 tablet (20 mg total) by mouth daily. 90 tablet 3  . clopidogrel (PLAVIX) 75 MG tablet Take 1 tablet (75 mg total) by mouth daily. 90 tablet 3  . isosorbide mononitrate (IMDUR) 60 MG 24 hr tablet Take 1 tablet (60 mg total) by mouth 2 (two) times daily. 180 tablet 3  . LORazepam (ATIVAN) 0.5 MG tablet TAKE ONE AND ONE-HALF TABLET BY MOUTH ASNEEDED FOR INSOMNIA 45 tablet 0  . losartan (COZAAR) 100 MG tablet TAKE ONE TABLET BY MOUTH EVERY DAY (Patient taking differently: Take 100 mg by mouth daily. ) 90 tablet 3  . nitroGLYCERIN (NITROSTAT) 0.4 MG SL tablet  PLACE 1 TABLET UNDER TONGUE EVERY 5 MIN AS NEEDED FOR CHEST PAIN IF NO RELIEF IN15 MIN CALL 911 (MAX 3 TABS) (Patient taking differently: Place 0.4 mg under the tongue every 5 (five) minutes as needed for chest pain. ) 25 tablet 1  . propranolol (INDERAL) 20 MG tablet TAKE ONE TABLET 3 TIMES DAILY AS NEEDED FOR TACHYCARDIA (Patient taking differently: Take 20 mg by mouth 3 (three) times daily as needed (tachycardia). ) 30 tablet 1  . ranolazine (RANEXA) 500 MG 12 hr tablet Take 1 tablet (500 mg total) by mouth 2 (two) times daily. (Patient taking differently:  Take 500 mg by mouth daily. ) 180 tablet 3   No facility-administered medications prior to visit.    Review of Systems;  Patient denies headache, fevers, malaise, unintentional weight loss, skin rash, eye pain, sinus congestion and sinus pain, sore throat, dysphagia,  hemoptysis , cough, dyspnea, wheezing, chest pain, palpitations, orthopnea, edema, abdominal pain, nausea, melena, diarrhea, constipation, flank pain, dysuria, hematuria, urinary  Frequency, nocturia, numbness, tingling, seizures,  Focal weakness, Loss of consciousness,  Tremor, insomnia, depression, anxiety, and suicidal ideation.      Objective:  BP 136/68 (BP Location: Left Arm, Patient Position: Sitting, Cuff Size: Normal)   Pulse 97   Temp 97.8 F (36.6 C) (Temporal)   Resp 15   Ht 5\' 3"  (1.6 m)   Wt 124 lb 3.2 oz (56.3 kg)   SpO2 97%   BMI 22.00 kg/m   BP Readings from Last 3 Encounters:  01/09/20 (!) 158/62  01/08/20 136/68  12/30/19 (!) 180/60    Wt Readings from Last 3 Encounters:  01/09/20 126 lb (57.2 kg)  01/08/20 124 lb 3.2 oz (56.3 kg)  12/30/19 130 lb (59 kg)    General appearance: alert, cooperative and appears tired, stated age Ears: normal TM's and external ear canals both ears Throat: lips, mucosa, and tongue normal; teeth and gums normal Neck: no adenopathy, no carotid bruit, supple, symmetrical, trachea midline and thyroid not enlarged, symmetric, no tenderness/mass/nodules Back: symmetric, no curvature. ROM normal. No CVA tenderness. Lungs: clear to auscultation bilaterally Heart: regular rate and rhythm, S1, S2 normal, no murmur, click, rub or gallop Abdomen: soft, non-tender; bowel sounds normal; no masses,  no organomegaly non tender to palpation of ribs  MSK: minimal pain with full ROM all movements.  Pulses: 2+ and symmetric Skin: Skin color, texture, turgor normal. No rashes or lesions Lymph nodes: Cervical, supraclavicular, and axillary nodes normal.  Lab Results  Component  Value Date   HGBA1C 5.8 01/30/2017   HGBA1C 5.8 09/26/2016    Lab Results  Component Value Date   CREATININE 1.26 (H) 01/08/2020   CREATININE 1.48 (H) 12/30/2019   CREATININE 1.19 (H) 07/08/2019    Lab Results  Component Value Date   WBC 9.9 12/30/2019   HGB 13.2 12/30/2019   HCT 40.8 12/30/2019   PLT 226 12/30/2019   GLUCOSE 109 (H) 01/08/2020   CHOL 120 04/02/2019   TRIG 135.0 04/02/2019   HDL 35.90 (L) 04/02/2019   LDLDIRECT 72.0 11/09/2015   LDLCALC 57 04/02/2019   ALT 12 07/08/2019   AST 18 07/08/2019   NA 138 01/08/2020   K 4.4 01/08/2020   CL 102 01/08/2020   CREATININE 1.26 (H) 01/08/2020   BUN 20 01/08/2020   CO2 27 01/08/2020   TSH 1.64 01/08/2020   INR 0.9 01/31/2018   HGBA1C 5.8 01/30/2017   MICROALBUR 2.5 (H) 12/26/2011    DG Chest 2  View  Result Date: 12/30/2019 CLINICAL DATA:  Weakness and dizziness EXAM: CHEST - 2 VIEW COMPARISON:  June 18, 2019 FINDINGS: Lungs are clear. Heart size and pulmonary vascularity are normal. Patient is status post coronary artery bypass grafting. There is aortic atherosclerosis. There also foci calcification in each subclavian artery. No adenopathy. 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  Bones are osteoporotic. IMPRESSION: Lungs clear. Cardiac silhouette within normal limits. Postoperative changes noted. Sizable paraesophageal hernia noted. Aortic Atherosclerosis (ICD10-I70.0).  Bones osteoporotic. Electronically Signed   By: Lowella Grip III M.D.   On: 12/30/2019 11:43    Assessment & Plan:   Problem List Items Addressed This Visit      Unprioritized   Acute respiratory failure with hypoxia ([place])   Anorexia    With insomnia, low energy .  Trial of mirtazipine at bedtime.  Reduce dose of lorazepam by 50%       B12 deficiency    b12 deficiency diagnosed. patient asked to return for B12 injections.  MMA and intrinsic factor ab ordered.   Lab Results  Component Value Date   VITAMINB12 215 01/08/2020          Relevant Orders   B12 and Folate Panel (Completed)   Methylmalonic Acid   Intrinsic Factor Antibodies   Diastolic CHF, chronic (HCC)   Generalized weakness    Likely multifactorial,  With dehydration .  b12 deficiency  Deconditioning secondary to age and COPD.  Pulmonary rehab referral offered and accepted.       Relevant Orders   Ambulatory referral to Physical Therapy   Left-sided chest wall pain    Her pain is not reproduced with palpation of rib cage so a fracture is unlikely.  Non pleuritic,  Likely MSK in nature due to recent particpation in golf after  a prolonged hiatus from  regular exercise .  Heating pad use encouarged.       Mild malnutrition ([place])     I have reviewed her diet and recommended that she increase her protein and fat intake while monitoring her carbohydrates.        Other Visit Diagnoses    Fatigue, unspecified type    -  Primary   Relevant Orders   TSH (Completed)   Decreased GFR       Relevant Orders   Basic metabolic panel (Completed)     I provided  30 minutes of  face-to-face time during this encounter reviewing patient's current problems and past surgeries, labs and imaging studies, providing counseling on the above mentioned problems , and coordination  of care .  I am having [name] start on mirtazapine. I am also having her maintain her atorvastatin, losartan, isosorbide mononitrate, amLODipine, clopidogrel, nitroGLYCERIN, propranolol, LORazepam, and albuterol.  Meds ordered this encounter  Medications  . mirtazapine (REMERON) 7.5 MG tablet    Sig: Take 1 tablet (7.5 mg total) by mouth at bedtime. 60 minutes before bedtime daily    Dispense:  30 tablet    Refill:  2    There are no discontinued medications.  Follow-up: No follow-ups on file.   [name], MD

## 2020-01-08 NOTE — Patient Instructions (Addendum)
I AM PRESCRIBING MIRTAZIPINE TO IMPROVE YOUR APPETITE   TAKE THE NEW MEDICATION  ONE HOUR BEFORE BEDTIME  REDUCE  YOUR SLEEPING PILL TO 1/2 TABLET AT BEDTIME   YOU NEED TO HAVE SOME REHAB WHICH I HAVE ORDERED

## 2020-01-08 NOTE — Progress Notes (Signed)
Office Visit    Patient Name: [name] Date of Encounter: 01/09/2020  Primary Care Provider:  [name], MD Primary Cardiologist:  Ida Rogue, MD Electrophysiologist:  None   Chief Complaint    [name] is a 86 y.o. female with a hx of CAD s/p CABG 1999, COPD, remote tobacco abuse (40 years, quit 1985), chronic renal insufficiency, HTN, HLD, bilateral carotid artery stenosis, paroxysmal tachycardia, anxiety, insomnia. She presents today for medication management.   Past Medical History    Past Medical History:  Diagnosis Date  . C. difficile colitis   . CAD (coronary artery disease)    a. Nuclear 10/11, not gated, no scar or ischemia, EF 68%, low risk study; b. [place] 06/18/14: no evidence of infarction or ischemia, no WMA, normalization of TWI in precordial leads, equivocal EKG changes EF 77%, low risk study  . Carotid artery disease ([place])    Doppler January, 2012, 40-59% bilateral, followup one year  . Closed fracture pubis ([place]) 07/04/2017  . COPD (chronic obstructive pulmonary disease) ([place])   . Diffuse cystic mastopathy   . Diverticulitis    last colonoscopy  incomplete Jan 2011  . Dizziness    stable now (Oct 2011) patient tells me question of TIA, we will obtain records  . Gait abnormality    Patient complains of "wobbly gait", December, 2013  . GERD (gastroesophageal reflux disease)   . History of migraines   . Hx of CABG    a. 3 vessel in 1999  . Hyperlipidemia   . Hypertension   . Indigestion    3 weeks, October 2011  . Kidney stones   . Rheumatic fever   . Sinus bradycardia    Mild, December, 2013  . SOB (shortness of breath)   . Syncopal episodes    after 18 holes of golf and increase hydrochlorothiazide, resolved    Past Surgical History:  Procedure Laterality Date  . APPENDECTOMY  1950  . BREAST BIOPSY Right 1994  . BREAST CYST ASPIRATION     multiple BIL  . CATARACT EXTRACTION Right 2009  . CATARACT  EXTRACTION Left 2011  . COLONOSCOPY  [id number]   Dr. Jamal Collin  . CORONARY ARTERY BYPASS GRAFT  1999  . esophagus stretched    . TONSILLECTOMY  1939  . TOTAL ABDOMINAL HYSTERECTOMY  1968   due to metrorrhagia    Allergies  Allergies  Allergen Reactions  . Codeine Nausea And Vomiting    Pt would like this removed. Not allergic to codeine.     History of Present Illness    [name] is a 86 y.o. female with a hx of CAD s/p CABG 1999, COPD, remote tobacco abuse (40 years, quit 1985), chronic renal insufficiency, HTN, HLD, bilateral carotid artery stenosis, paroxysmal tachycardia, anxiety, insomnia. She was last seen 12/04/19 by Dr. Rockey Situ.  Echocardiogram in September 2016 with LVEF >60%. Hospitalized April 2018 for COPD exacerbation. She had a fall and pelvic fracture June 2018. Stress test September 2020 was moderate risk due to equivocal ST abnormality V2-V5 following infusion of lexiscan with no significant ischemia, EF 56%.   At last office visit noted atypical left chest pain tender on palpation felt to be musculoskeletal. She noted shortness of breath, but was not taking Combivent. She declined repeat stress testing and cardiac catheterization. Echocardiogram was ordered and Ranexa 500mg  BID with plan to increase to 1,000mg  BID after 1 week.   She called the office 12/20/19 noting  dizziness after increasing dose of Ranexa. Was recommended to reduce to 500mg  BID. She was seen in the ED 12/30/19 with dizziness and fatigue. Her BP in the ED was 137/67. She was mildly dehydrated on exam with mildly elevated creatinine. She was given IVF. Suspected fatigue secondary to mild dehydration and COPD exacerbation. She was given prednisone and encouraged to use her combivent inhaler.    Presents today for medication management. She is very worried that we will be upset with her regarding her medications, reassurance provided that we just want to be sure we get her feeling well. Tells me she  believes the majority of her symptoms of weakness and dizziness were due to dehydration. Has been drinking Pedialyte and 'chocolate protein drinks' since being told to drink more fluids. No recurrent lightheadedness, dizziness. Her dyspnea on exertion is much better after using her Combivent twice daily as directed by the ED and completing short course of prednisone.  Checks her BP at home with systolic readings typically in the 150s. Reports no low readings when checked at home.  Reports she continues to feel fatigued. Saw her PCP Dr. Derrel Nip yesterday who added low dose Remeron at bedtime to help with appetite and home health physical therapy through [facility]. Miss Egge was able to golf as recently as one month ago and is hopeful to get back to this level of activity.   EKGs/Labs/Other Studies Reviewed:   The following studies were reviewed today:  EKG:  EKG is ordered today.  The ekg ordered today demonstrates SR 62 bpm with stable nonspecific ST/T wave changes.   Recent Labs: 07/08/2019: ALT 12 12/30/2019: B Natriuretic Peptide 144.0; Hemoglobin 13.2; Platelets 226 01/08/2020: BUN 20; Creatinine, Ser 1.26; Potassium 4.4; Sodium 138; TSH 1.64  Recent Lipid Panel    Component Value Date/Time   CHOL 120 04/02/2019 0847   CHOL 143 06/18/2014 0419   TRIG 135.0 04/02/2019 0847   TRIG 126 06/18/2014 0419   HDL 35.90 (L) 04/02/2019 0847   HDL 46 06/18/2014 0419   CHOLHDL 3 04/02/2019 0847   VLDL 27.0 04/02/2019 0847   VLDL 25 06/18/2014 0419   LDLCALC 57 04/02/2019 0847   LDLCALC 72 06/18/2014 0419   LDLDIRECT 72.0 11/09/2015 1209    Home Medications   Current Meds  Medication Sig  . albuterol (VENTOLIN HFA) 108 (90 Base) MCG/ACT inhaler Inhale 2 puffs into the lungs every 6 (six) hours as needed for wheezing or shortness of breath.  Marland Kitchen amLODipine (NORVASC) 2.5 MG tablet Take 1 tablet (2.5 mg total) by mouth daily.  Marland Kitchen atorvastatin (LIPITOR) 20 MG tablet Take 1 tablet (20 mg total)  by mouth daily.  . clopidogrel (PLAVIX) 75 MG tablet Take 1 tablet (75 mg total) by mouth daily.  . isosorbide mononitrate (IMDUR) 60 MG 24 hr tablet Take 1 tablet (60 mg total) by mouth 2 (two) times daily.  Marland Kitchen LORazepam (ATIVAN) 0.5 MG tablet TAKE ONE AND ONE-HALF TABLET BY MOUTH ASNEEDED FOR INSOMNIA  . losartan (COZAAR) 100 MG tablet TAKE ONE TABLET BY MOUTH EVERY DAY (Patient taking differently: Take 100 mg by mouth daily. )  . mirtazapine (REMERON) 7.5 MG tablet Take 1 tablet (7.5 mg total) by mouth at bedtime. 60 minutes before bedtime daily  . nitroGLYCERIN (NITROSTAT) 0.4 MG SL tablet PLACE 1 TABLET UNDER TONGUE EVERY 5 MIN AS NEEDED FOR CHEST PAIN IF NO RELIEF IN15 MIN CALL 911 (MAX 3 TABS) (Patient taking differently: Place 0.4 mg under the tongue every 5 (five)  minutes as needed for chest pain. )  . propranolol (INDERAL) 20 MG tablet TAKE ONE TABLET 3 TIMES DAILY AS NEEDED FOR TACHYCARDIA (Patient taking differently: Take 20 mg by mouth 3 (three) times daily as needed (tachycardia). )  . ranolazine (RANEXA) 500 MG 12 hr tablet Take 1 tablet (500 mg total) by mouth 2 (two) times daily.  . [DISCONTINUED] ranolazine (RANEXA) 500 MG 12 hr tablet Take 1 tablet (500 mg total) by mouth 2 (two) times daily. (Patient taking differently: Take 500 mg by mouth daily. )      Review of Systems      Review of Systems  Constitution: Positive for malaise/fatigue. Negative for chills and fever.  Cardiovascular: Positive for dyspnea on exertion. Negative for chest pain, leg swelling, near-syncope, orthopnea, palpitations and syncope.  Respiratory: Negative for cough, shortness of breath and wheezing.   Gastrointestinal: Negative for nausea and vomiting.  Neurological: Negative for dizziness, light-headedness and weakness.   All other systems reviewed and are otherwise negative except as noted above.  Physical Exam    VS:  BP (!) 158/62 (BP Location: Left Arm, Patient Position: Sitting, Cuff Size:  Normal)   Pulse 65   Ht 5\' 3"  (1.6 m)   Wt 126 lb (57.2 kg)   SpO2 97%   BMI 22.32 kg/m  , BMI Body mass index is 22.32 kg/m. GEN: Well nourished, thin, well developed, in no acute distress. HEENT: normal. Neck: Supple, no JVD, carotid bruits, or masses. Cardiac: RRR, no murmurs, rubs, or gallops. No clubbing, cyanosis, edema.  Radials/PT 2+ and equal bilaterally.  Respiratory:  Respirations regular and unlabored, clear to auscultation bilaterally. GI: Soft, nontender, nondistended, BS + x 4. MS: No deformity or atrophy. Skin: Warm and dry, no rash. Neuro:  Strength and sensation are intact. Psych: Normal affect.   Assessment & Plan    1. CAD s/p CABG - EKG today with no acute ST/T wave changes. At last visit started on Ranexa 500mg  BID, when she increased to 1000mg  BID per directions she was dizzy but also notably dehydrated requiring ED visit for IV fluids. Anticipate many symptoms were due to dehydration, not Ranexa as she has not had recurrent symptoms. Will continue present dosing of Ranexa 500mg  BID for chronic stable anginal symptoms. Her GDMT includes plavix, beta blocker, statin, Imdur, Ranexa, and PRN nitroglycerin. Of note, she had recent ED eval and PCP eval for fatigue and dizziness with new findings of dehydration, B12 deficiency so I anticipate her symptoms will improve with management of these conditions. If they resolve completely, may be able to simplify medication regimen in the future.   2. HTN - BP mildly elevated today, but better at recent office visits. Continue current antihypertensive regimen. She will continue monitor BP at home and we will reassess in 1 month. Hesitant to up-titrate present medications due to recent dizziness, lightheadedness.   3. HLD, LDL goal <70 - Continue Atorvastatin 20mg  daily.   4. Shortness of breath - Improved since taking Combivent BID and completing short course Prednisone prescribed by ED. Likely multifactorial deconditioning,  COPD, allergic rhinitis. Low suspicion CAD contributory, as above. Upcoming echocardiogram to rule out heart failure or valvular abnormality as contributory.   5. Carotid stenosis - Last ech owith <39% bilaterally. Continue lipid control. Continue plavix, statin.   6. Fatigue - Likely multifactorial. New diagnosis of B12 deficiency, plan for B12 injections with her PCP. Deconditioning, recent dehydration could also be contributory. When seen recently by PCP an order  was placed for home health PT, she is excited to participate. Upcoming echocardiogram to rule out worsening LVEF, valvular abnormalities, or RWMA.   Disposition: Follow up as previously scheduled with Dr. Rockey Situ.    Loel Dubonnet, NP 01/09/2020, 1:31 PM

## 2020-01-09 ENCOUNTER — Ambulatory Visit (INDEPENDENT_AMBULATORY_CARE_PROVIDER_SITE_OTHER): Payer: Medicare Other | Admitting: Family

## 2020-01-09 ENCOUNTER — Encounter: Payer: Self-pay | Admitting: Family

## 2020-01-09 VITALS — BP 158/62 | HR 65 | Ht 63.0 in | Wt 126.0 lb

## 2020-01-09 DIAGNOSIS — I6523 Occlusion and stenosis of bilateral carotid arteries: Secondary | ICD-10-CM

## 2020-01-09 DIAGNOSIS — R5383 Other fatigue: Secondary | ICD-10-CM | POA: Diagnosis not present

## 2020-01-09 DIAGNOSIS — I10 Essential (primary) hypertension: Secondary | ICD-10-CM

## 2020-01-09 DIAGNOSIS — Z951 Presence of aortocoronary bypass graft: Secondary | ICD-10-CM | POA: Diagnosis not present

## 2020-01-09 DIAGNOSIS — R0602 Shortness of breath: Secondary | ICD-10-CM

## 2020-01-09 DIAGNOSIS — E538 Deficiency of other specified B group vitamins: Secondary | ICD-10-CM | POA: Insufficient documentation

## 2020-01-09 DIAGNOSIS — I25118 Atherosclerotic heart disease of native coronary artery with other forms of angina pectoris: Secondary | ICD-10-CM | POA: Diagnosis not present

## 2020-01-09 DIAGNOSIS — E782 Mixed hyperlipidemia: Secondary | ICD-10-CM

## 2020-01-09 MED ORDER — RANOLAZINE ER 500 MG PO TB12: 500.0000 mg | ORAL_TABLET | Freq: Two times a day (BID) | ORAL | 3 refills | Status: DC

## 2020-01-09 NOTE — Patient Instructions (Addendum)
Medication Instructions:   Take your Ranolazine (Ranexa) 500mg  (one tablet) twice per day.  *If you need a refill on your cardiac medications before your next appointment, please call your pharmacy*  Lab Work: No lab work today.   Testing/Procedures: Your EKG today looked good!   Follow-Up: At [facility], you and your health needs are our priority.  As part of our continuing mission to provide you with exceptional heart care, we have created designated Provider Care Teams.  These Care Teams include your primary Cardiologist (physician) and Advanced Practice Providers (APPs -  Physician Assistants and Nurse Practitioners) who all work together to provide you with the care you need, when you need it.  Your next appointment:   As previously scheduled with Dr. Rockey Situ.  Other Instructions  Dr. Derrel Nip has sent a referral for physical therapy.   Your weakness is likely a combination of your known heart disease, dehydration, and not being as active. Keep up the good work with your Pedialyte and protein drinks. We will continue your Ranexa at one tablet twice per day just in case it caused you some weakness. We can re-evaluate increasing the dose after you get some therapy at your next appointment with Dr. Rockey Situ.   The mirtazapine (Remeron) that Dr. Derrel Nip gave you is to help increase your appetite. If you drink a glass of wine, please be careful that you don't get too sleepy. Be careful when you move from the couch to the bed.

## 2020-01-10 DIAGNOSIS — R630 Anorexia: Secondary | ICD-10-CM | POA: Insufficient documentation

## 2020-01-10 DIAGNOSIS — J9601 Acute respiratory failure with hypoxia: Secondary | ICD-10-CM | POA: Insufficient documentation

## 2020-01-10 DIAGNOSIS — R0789 Other chest pain: Secondary | ICD-10-CM | POA: Insufficient documentation

## 2020-01-10 NOTE — Assessment & Plan Note (Signed)
I have reviewed her diet and recommended that she increase her protein and fat intake while monitoring her carbohydrates.  

## 2020-01-10 NOTE — Assessment & Plan Note (Signed)
Her pain is not reproduced with palpation of rib cage so a fracture is unlikely.  Non pleuritic,  Likely MSK in nature due to recent particpation in golf after  a prolonged hiatus from  regular exercise .  Heating pad use encouarged.

## 2020-01-10 NOTE — Assessment & Plan Note (Signed)
With insomnia, low energy .  Trial of mirtazipine at bedtime.  Reduce dose of lorazepam by 50%

## 2020-01-10 NOTE — Assessment & Plan Note (Signed)
Likely multifactorial,  With dehydration .  b12 deficiency  Deconditioning secondary to age and COPD.  Pulmonary rehab referral offered and accepted.

## 2020-01-10 NOTE — Assessment & Plan Note (Signed)
b12 deficiency diagnosed. patient asked to return for B12 injections.  MMA and intrinsic factor ab ordered.   Lab Results  Component Value Date   [id number] 01/08/2020

## 2020-01-13 ENCOUNTER — Telehealth: Payer: Self-pay | Admitting: Internal Medicine

## 2020-01-13 NOTE — Telephone Encounter (Signed)
Patient daughter called in with Question concerning B 12 levels as to the status of how low and the effects of Low B 12. Daughter as DPR was given B 12 teaching and results of levels patient daughter ask could injections be scheduled sooner injection scheduled for first available Wednesday 01/15/20 and continuing each Wednesday for total of 3 injections.

## 2020-01-15 ENCOUNTER — Telehealth: Payer: Self-pay | Admitting: Internal Medicine

## 2020-01-15 ENCOUNTER — Ambulatory Visit (INDEPENDENT_AMBULATORY_CARE_PROVIDER_SITE_OTHER): Payer: Medicare Other

## 2020-01-15 ENCOUNTER — Other Ambulatory Visit: Payer: Self-pay

## 2020-01-15 DIAGNOSIS — E538 Deficiency of other specified B group vitamins: Secondary | ICD-10-CM | POA: Diagnosis not present

## 2020-01-15 DIAGNOSIS — R0600 Dyspnea, unspecified: Secondary | ICD-10-CM

## 2020-01-15 MED ORDER — CYANOCOBALAMIN 1000 MCG/ML IJ SOLN
1000.0000 ug | Freq: Once | INTRAMUSCULAR | Status: AC
  Administered 2020-01-15: 1000 ug via INTRAMUSCULAR

## 2020-01-15 NOTE — Telephone Encounter (Signed)
Pt has an appt with cardiologist today and wont be in until after 4. If you can not reach pt, please leave detailed message.

## 2020-01-15 NOTE — Telephone Encounter (Signed)
Error on the [facility].  If you can ask Dr Derrel Nip if she has a place in mind for at home PT. Thank you!

## 2020-01-15 NOTE — Telephone Encounter (Signed)
Referral has been placed to the requested facility. I will forward this to the nurse so she can speak to Dr Derrel Nip. I also will call pt to let pt know message was sent to provider nurse.   Janett Billow ask Dr Derrel Nip if its ok to refer pt to [facility] lab at home? Please advise and Thank you!

## 2020-01-15 NOTE — Telephone Encounter (Signed)
Pt was checking on referral to physical therapy and she states that she would rather have PT in home so she did not have to drive back and forth. Please advise

## 2020-01-15 NOTE — Progress Notes (Signed)
Patient presented for B 12 injection to right deltoid, patient voiced no concerns nor showed any signs of distress during injection. 

## 2020-01-16 ENCOUNTER — Ambulatory Visit: Payer: Medicare Other

## 2020-01-16 NOTE — Telephone Encounter (Signed)
Pt called returning your call 

## 2020-01-16 NOTE — Telephone Encounter (Signed)
Home pHysical therapy is only paid for by insurance if the patient is homebound (unable to drive or be driven).    It would be cheaper for her to [place] [facility] to PT sessions than to pay for home PT out of pocket

## 2020-01-16 NOTE — Telephone Encounter (Signed)
Ok. I will contact pt and let her know. Thanks.

## 2020-01-16 NOTE — Telephone Encounter (Signed)
[name] would like to if you know of somewhere that does in home PT.

## 2020-01-22 ENCOUNTER — Other Ambulatory Visit: Payer: Self-pay

## 2020-01-22 ENCOUNTER — Ambulatory Visit (INDEPENDENT_AMBULATORY_CARE_PROVIDER_SITE_OTHER): Payer: Medicare Other

## 2020-01-22 DIAGNOSIS — E538 Deficiency of other specified B group vitamins: Secondary | ICD-10-CM

## 2020-01-22 MED ORDER — CYANOCOBALAMIN 1000 MCG/ML IJ SOLN
1000.0000 ug | Freq: Once | INTRAMUSCULAR | Status: AC
  Administered 2020-01-22: 1000 ug via INTRAMUSCULAR

## 2020-01-22 NOTE — Progress Notes (Signed)
Patient presented for B 12 injection to right deltoid, patient voiced no concerns nor showed any signs of distress during injection. 

## 2020-01-23 ENCOUNTER — Ambulatory Visit: Payer: Medicare Other

## 2020-01-27 ENCOUNTER — Encounter: Payer: Self-pay | Admitting: *Deleted

## 2020-01-27 ENCOUNTER — Other Ambulatory Visit: Payer: Self-pay | Admitting: *Deleted

## 2020-01-27 NOTE — Patient Outreach (Signed)
[place] [facility]) Care Management [facility] CM Telephone Outreach, Transfer to [place] discharge day # [phone] without unplanned hospital readmission  01/27/2020  [name] April 28, 1926 [id number]  Unsuccessful telephone outreach x 2 toBetty Intriago, 86 y/o female, routine referred to [place] by [place] 06/17/2019 for self health management of CHF.Patient has history including, but not limited to, CHF; COPD; HTN/ HLD; CAD; CKD- III. Noted from review of EMR that patient experienced post- referral observationhospitalization September 28-29, 2020 for chest pain; patient was ruled out for ACS/ MI and was discharged home to self-care without home health services in place.  With both call attempts today, patient's phone had busy signal; unable to leave patient voice message requesting call back  Plan:  Will re-attempt THN CM telephone outreach within 4 business days if I do not hear back from patient first  Oneta Rack, RN, BSN, [employer] Coordinator [facility] Care Management  [phone]

## 2020-01-28 ENCOUNTER — Ambulatory Visit (INDEPENDENT_AMBULATORY_CARE_PROVIDER_SITE_OTHER): Payer: Medicare Other

## 2020-01-28 ENCOUNTER — Other Ambulatory Visit: Payer: Self-pay

## 2020-01-28 DIAGNOSIS — E538 Deficiency of other specified B group vitamins: Secondary | ICD-10-CM | POA: Diagnosis not present

## 2020-01-28 MED ORDER — CYANOCOBALAMIN 1000 MCG/ML IJ SOLN
1000.0000 ug | Freq: Once | INTRAMUSCULAR | Status: AC
  Administered 2020-01-28: 1000 ug via INTRAMUSCULAR

## 2020-01-28 NOTE — Progress Notes (Signed)
Patient presented for B 12 injection to right deltoid, patient voiced no concerns nor showed any signs of distress during injection.  Patient wanted to know if she is due for labs. To check her b12 levels.

## 2020-01-29 ENCOUNTER — Encounter: Payer: Self-pay | Admitting: *Deleted

## 2020-01-29 ENCOUNTER — Ambulatory Visit: Payer: Medicare Other

## 2020-01-29 ENCOUNTER — Other Ambulatory Visit: Payer: Self-pay | Admitting: *Deleted

## 2020-01-29 NOTE — Patient Outreach (Signed)
[place] [facility]) Care Management [place] Telephone Outreach, Transfer to [place] discharge day # 227 without unplanned hospital re-admission  01/29/2020  [name] 08-07-1926 [id number]  Successful telephone outreach toBetty Allen, 86 y/o female, routine referred to [place] by [place] 06/17/2019 for self health management of CHF.Patient has history including, but not limited to, CHF; COPD; HTN/ HLD; CAD; CKD- III. Noted from review of EMR that patient experienced post- referral observationhospitalization September 28-29, 2020 for chest pain; patient was ruled out for ACS/ MI and was discharged home to self-care without home health services in place.  HIPAA/ identity verified. Patient reports "doing great" and tells me that she played 9-holes of golf yesterday and "tolerated great;" stated, "I am not even sore today;" adding that since she was recently prescribed B-12 injections, she has "felt much better," post- ED visit 12/30/19 when she was dehydrated after playing golf-- confirms that her PCP subsequently ordered B-12 shots and mirtazipine- patient confirms both are helping and confirms that she is taking all as prescribed.  She denies pain, clinical concerns, and new/ recent falls today and sounds to be in no distress throughout our call today.  She tells me she can not talk very long today, as she is going out to play bridge with her friends soon.  Patient further reports:  -- no concernsaround medications; continuesself-managing medications and declines medication review, stating time constraints-- confirms that she has made all changes to medications and understands new medications, stating that she thinks "they are really helping."  -- has attended all recently scheduled provider appointments  ---- continues driving self; denies transportation concerns; confirms daughter is available to assist if  needed ---- upcoming cardiology provider appointment scheduled for Feb 05, 2020; confirms she plans to drive self to appointment ---- has scheduled outpatient PT sessions, starting tomorrow, per patient report: states she is not sure that she needs PT twice a week, and plans to discuss frequency of scheduled sessions with PT tomorrow- this was encouraged; I also encouraged patient to attend regularly, and discussed benefits of formal PT intervention to maintain and build upon her current activity level  -- continues to deny community resource needs: briefly discussed with patient insurance benefits/ community resources around wellness, however, patient prefers to continue playing golf; explained I just wanted to remind her of options for wellness  --hascontinueddaily weight monitoring/ recording, reviewedrecent weights at home;againreports daily weight ranges "about the same as always" between 129-132 lbsconsistentlywith weight this morning of "130lbs." --using teach back method, reviewed weight gain guidelines/ corresponding action plan in setting of CHF: patient appreciates the reminder and remains able to independently verbalize with minimal prompting --using teach back method,reiterated previously provided education around early/ late signs CHF exacerbationand rationale/ purpose of weight monitoring at home- encouraged patient to promptly notify care providers for any development of early signs, and she verbalizes agreement -- continues following heart healthy diet -- continues monitoring and recording-  reports "all good at home," andagaindeclines review of same, stating time constraints  Patient denies further issues, concerns, or problems today, and we discussed possibility of transfer to [place], as patient has thus far met her previously established goals and denies ongoing care coordination/ care management needs; she is agreeable to this; encouraged patient to fully  engage with [place] as she was prior to [facility] RN CM involvement, and she again verbalizes agreement.  Explained that [place]  Coach would contact her next month and confirmed thatshehasmy direct phone number should needs arise prior to outreach by [facility] RN Health Coach.  Plan:  Patient will take medications as prescribed and will attend all scheduled provider appointments  Patient will promptly notify care providers for any new concerns/ issues/ problems that arise  Patient will continue monitoring/andrecording daily weightsat homeand will follow action plan for weight gain guidelines in setting of CHF  Patient will participate in recently ordered outpatient PT  I will place referral for THN RN Health Coach, and patient will engage with [facility]N RN Health Coach, once outreach is established  I will make patient's PCP aware of [facility]N discipline transfer from care management to disease management- will send discipline closure/ transfer letter   THN CM Care Plan Problem Two     Most Recent Value  Care Plan Problem Two  Need for ongoing reinforcement around self-health management of chronic disease state of CHF, as evidenced by patient reporting   Role Documenting the Problem Two  Care Management Coordinator  Care Plan for Problem Two  Active  Interventions for Problem Two Long Term Goal   Confirmed that patient continues monitoring and recording daily weights at home and understands rationale for weight monitoring,  reviewed with patient recently recorded weights at home and using teachback method, reiterated previously provided education around signs/ symptoms yellow CHF zone and weight gain guidelines in setting of CHF,  confirmed that patient continues to deny community resource needs and currently denies ongoing care coordination/ care management needs- discussed with patient and placed referral for [facility] RN Health Coach and encouraged patient to actively engage with patient  once outreach is established  THN Long Term Goal  Over the next 90 days, patient will contiue monitoring and recording daily weights at home and will promptly contact care providers for any new concerns that arise, as evidenced by patient reporting and review of same during THN RN CM outreach  THN Long Term Goal Start Date  10/31/19  THN Long Term Goal Met Date  01/29/20 [Goal Met]  THN CM Short Term Goal #1   Over the next 30 days, patient will actively engage with [facility] RN Health Coach for ongoing reinforcement of self-health management of chronic disease state of CHF, as evidenced by successful patient engagement with THN RN Health Coach  THN CM Short Term Goal #1 Start Date  01/29/20  Interventions for Short Term Goal #2   Discussed differences in roles of THN RN CM and [facility] RN Health Coach,  encouraged patient to actively engage with THN RN Health Coach and placed referal for same  THN CM Short Term Goal #2   Over the next 30 days, patient will participate in recently ordered outpatient PT as evidenced by patient reporting during THN RN health Coach outreach  THN CM Short Term Goal #2 Start Date  01/29/20  Interventions for Short Term Goal #2  Discussed with patient her current activity level and ability to continue playing golf regularly,  encouraged her to participate in recently ordered outpatient PT to maintain/ build upon her current activity tolerance,  confirmed that patient has scheduled outpatient PT appointment tomorrow along with plans to attend and will drive self to appointment     It has been a pleasure caring for Katrina,   Allen , RN, BSN, CCRN [employer] [facility] Coordinator [facility] Care Management  [phone]    

## 2020-01-30 ENCOUNTER — Ambulatory Visit: Payer: Medicare Other

## 2020-01-30 ENCOUNTER — Ambulatory Visit: Payer: Medicare Other | Attending: Internal Medicine | Admitting: Physical Therapy

## 2020-01-30 ENCOUNTER — Encounter: Payer: Self-pay | Admitting: Physical Therapy

## 2020-01-30 ENCOUNTER — Other Ambulatory Visit: Payer: Self-pay | Admitting: *Deleted

## 2020-01-30 DIAGNOSIS — R2681 Unsteadiness on feet: Secondary | ICD-10-CM | POA: Diagnosis not present

## 2020-01-30 DIAGNOSIS — M6281 Muscle weakness (generalized): Secondary | ICD-10-CM

## 2020-01-30 DIAGNOSIS — M66829 Spontaneous rupture of other tendons, unspecified upper arm: Secondary | ICD-10-CM | POA: Diagnosis not present

## 2020-01-30 NOTE — Therapy (Signed)
[place] MAIN [facility] SERVICES [address] [place], [place], [address] Phone: [phone]   Fax:  [phone]  Physical Therapy Evaluation  Patient Details  Name: [name] MRN: [id number] Date of Birth: [birth year] Referring Provider (PT): Derrel Nip MD   Encounter Date: 01/30/2020  PT End of Session - 01/30/20 1128    Visit Number  1    Number of Visits  9    Date for PT Re-Evaluation  03/26/20    Authorization Type  medicare    Authorization Time Period  start of care 01/30/20    PT Start Time  1020    PT Stop Time  1120    PT Time Calculation (min)  60 min    Equipment Utilized During Treatment  Gait belt    Activity Tolerance  Patient tolerated treatment well    Behavior During Therapy  WFL for tasks assessed/performed       Past Medical History:  Diagnosis Date  . C. difficile colitis   . CAD (coronary artery disease)    a. Nuclear 10/11, not gated, no scar or ischemia, EF 68%, low risk study; b. [place] 06/18/14: no evidence of infarction or ischemia, no WMA, normalization of TWI in precordial leads, equivocal EKG changes EF 77%, low risk study  . Carotid artery disease ([place])    Doppler January, 2012, 40-59% bilateral, followup one year  . Closed fracture pubis ([place]) 07/04/2017  . COPD (chronic obstructive pulmonary disease) ([place])   . Diffuse cystic mastopathy   . Diverticulitis    last colonoscopy  incomplete Jan 2011  . Dizziness    stable now (Oct 2011) patient tells me question of TIA, we will obtain records  . Gait abnormality    Patient complains of "wobbly gait", December, 2013  . GERD (gastroesophageal reflux disease)   . History of migraines   . Hx of CABG    a. 3 vessel in 1999  . Hyperlipidemia   . Hypertension   . Indigestion    3 weeks, October 2011  . Kidney stones   . Rheumatic fever   . Sinus bradycardia    Mild, December, 2013  . SOB (shortness of breath)   . Syncopal episodes    after  18 holes of golf and increase hydrochlorothiazide, resolved     Past Surgical History:  Procedure Laterality Date  . APPENDECTOMY  1950  . BREAST BIOPSY Right 1994  . BREAST CYST ASPIRATION     multiple BIL  . CATARACT EXTRACTION Right 2009  . CATARACT EXTRACTION Left 2011  . COLONOSCOPY  [id number]   Dr. Jamal Collin  . CORONARY ARTERY BYPASS GRAFT  1999  . esophagus stretched    . TONSILLECTOMY  1939  . TOTAL ABDOMINAL HYSTERECTOMY  1968   due to metrorrhagia    There were no vitals filed for this visit.   Subjective Assessment - 01/30/20 1028    Subjective  "The B12 shots have helped a lot. I do think that I need some help with my balance."    Pertinent History  86 yo Female reports getting dehydrated on 12/30/19 and had to go to the hospital. She reports she got so weak she was unable to move around and walk. She reports she has since gotten B12 shots and is feeling better but is not quite at her PLOF. She does not use any assistive devices. she lives alone in a 2 story home with her bedroom/bathroom on 2nd floor. She  is able to negotiate steps well in variable manner (reciprocal and non-reciprocal). She denies any recent falls but reports feeling unsteady at times. She denies any numbness/tingling;  She reports getting short of breath with exertion especially when negotiating steps. She does have a PMH of heart disease including diastolic CHF and COPD, she has an inhaler but does not require supplemental oxygen.    Limitations  Standing;Walking    How long can you sit comfortably?  NA    How long can you stand comfortably?  30 min with minimal fatigue    How long can you walk comfortably?  >500 feet, mild fatigue    Diagnostic tests  NA    Patient Stated Goals  Improve balance and reduce shortness of breath;    Currently in Pain?  No/denies         [facility] PT Assessment - 01/30/20 0001      Assessment   Medical Diagnosis  weakness    Referring Provider (PT)  Derrel Nip MD    Onset  Date/Surgical Date  12/30/19    Hand Dominance  Right    Next MD Visit  none scheduled    Prior Therapy  no therapy for this condition      Precautions   Precautions  Fall      Restrictions   Weight Bearing Restrictions  No      Balance Screen   Has the patient fallen in the past 6 months  No    Has the patient had a decrease in activity level because of a fear of falling?   Yes    Is the patient reluctant to leave their home because of a fear of falling?   Yes      Home Environment   Additional Comments  Lives in 2 story home alone, bed/bath on 2nd floor; mod I for self care ADLs, does have help with cleaning home; does her own cooking; still driving      Prior Function   Level of Independence  Independent    Vocation  Retired    Leisure  Marketing executive, play bridge/reading;       Cognition   Overall Cognitive Status  Within Functional Limits for tasks assessed      Observation/Other Assessments   Observations  very nice woman, hard of hearing    Skin Integrity  grossly intact    Focus on Therapeutic Outcomes (FOTO)   60/100      Sensation   Light Touch  Appears Intact      Coordination   Gross Motor Movements are Fluid and Coordinated  Yes    Fine Motor Movements are Fluid and Coordinated  Yes      Posture/Postural Control   Posture Comments  WFL      ROM / Strength   AROM / PROM / Strength  AROM;Strength      AROM   Overall AROM Comments  BUE are WFL, decreased shoulder flexion/abduction but no pain; BLE are [facility]      Strength   Overall Strength Comments  BUE grossly 5/5;     Right/Left Hip  Right;Left    Right Hip Flexion  4-/5    Right Hip Extension  3-/5    Right Hip ABduction  4-/5    Left Hip Flexion  4-/5    Left Hip Extension  3/5    Left Hip ABduction  4-/5    Right/Left Knee  Right;Left    Right Knee Flexion  4/5  Right Knee Extension  4/5    Left Knee Flexion  4/5    Left Knee Extension  4/5    Right/Left Ankle  Right;Left    Right Ankle  Dorsiflexion  4/5    Left Ankle Dorsiflexion  4/5      Transfers   Comments  able to transfer sit<>Stand without HHA      Ambulation/Gait   Gait Comments  ambulates without AD, independently with good reciprocal gait pattern, normal base of support      6 Minute Walk- Baseline   6 Minute Walk- Baseline  yes    BP (mmHg)  144/53    HR (bpm)  61    02 Sat (%RA)  100 %      6 Minute walk- Post Test   6 Minute Walk Post Test  yes    BP (mmHg)  157/60    HR (bpm)  85    02 Sat (%RA)  99 %    Modified Borg Scale for Dyspnea  3- Moderate shortness of breath or breathing difficulty      6 minute walk test results    Aerobic Endurance Distance Walked  1060    Endurance additional comments  without AD, good balance and gait mechanics      Standardized Balance Assessment   Five times sit to stand comments   15.92 sec with arms across chest (slight risk for falls)    10 Meter Walk  0.95 m/s without AD, limited community ambulator      [employer]   Sit to Stand  Able to stand without using hands and stabilize independently    Standing Unsupported  Able to stand safely 2 minutes    Sitting with Back Unsupported but Feet Supported on Floor or Stool  Able to sit safely and securely 2 minutes    Stand to Sit  Sits safely with minimal use of hands    Transfers  Able to transfer safely, minor use of hands    Standing Unsupported with Eyes Closed  Able to stand 10 seconds with supervision    Standing Unsupported with Feet Together  Able to place feet together independently and stand for 1 minute with supervision    From Standing, Reach Forward with Outstretched Arm  Can reach forward >12 cm safely (5")    From Standing Position, Pick up Object from Floor  Able to pick up shoe safely and easily    From Standing Position, Turn to Look Behind Over each Shoulder  Looks behind from both sides and weight shifts well    Turn 360 Degrees  Able to turn 360 degrees safely but slowly    Standing  Unsupported, Alternately Place Feet on Step/Stool  Able to stand independently and complete 8 steps >20 seconds    Standing Unsupported, One Foot in Front  Able to plae foot ahead of the other independently and hold 30 seconds    Standing on One Leg  Tries to lift leg/unable to hold 3 seconds but remains standing independently    Total Score  46    Berg comment:  80% risk for falls                  Objective measurements completed on examination: See above findings.              PT Education - 01/30/20 1127    Education Details  Plan of care/recommendation    Person(s) Educated  Patient  Methods  Explanation;Verbal cues    Comprehension  Verbalized understanding;Returned demonstration;Verbal cues required;Need further instruction       PT Short Term Goals - 01/30/20 1132      PT SHORT TERM GOAL #1   Title  Patient will be adherent to HEP at least 3x a week to improve functional strength and balance for better safety at home.    Time  4    Period  Weeks    Status  New    Target Date  02/27/20      PT SHORT TERM GOAL #2   Title  Patient will increase BLE gross strength to 4+/5 as to improve functional strength for independent gait, increased standing tolerance and increased ADL ability.    Time  4    Period  Weeks    Status  New    Target Date  02/27/20        PT Long Term Goals - 01/30/20 1132      PT LONG TERM GOAL #1   Title  Patient will increase 10 meter walk test to >1.29m/s as to improve gait speed for better community ambulation and to reduce fall risk.    Time  8    Period  Weeks    Status  New    Target Date  03/26/20      PT LONG TERM GOAL #2   Title  Patient will increase Berg Balance score by > 6 points to demonstrate decreased fall risk during functional activities.    Time  8    Period  Weeks    Status  New    Target Date  03/26/20      PT LONG TERM GOAL #3   Title  Patient will increase six minute walk test distance to >1200  for progression to community ambulator and improve gait ability    Time  8    Period  Weeks    Status  New    Target Date  03/26/20      PT LONG TERM GOAL #4   Title  Patient will improve FOTO score to >65/100 to indicate improved functional mobility with ADLs and community tasks.    Time  8    Period  Weeks    Status  New    Target Date  03/26/20             Plan - 01/30/20 1128    Clinical Impression Statement  86 yo Female reports increased weakness and imbalance since being hospitalized on 12/30/19 for dehydration. Patient has since been given B12 shots which has helped a lot. She does live alone in a 2 story home with her bed/bath on 2nd floor. Patient reports getting short of breath when negotiating steps and feeling unsteady at home. She does exhibit increased weakness in BLE. She also tested as a high fall risk as evidenced by Merrilee Jansky Balance Assessment. Patient would benefit from skilled PT intervention to improve strength, balance and mobility;    Personal Factors and Comorbidities  Age;Comorbidity 3+    Comorbidities  COPD/Diastolic CHF, HTN, high fall risk, former smoker,    Examination-Activity Limitations  Carry;Lift;Locomotion Level;Squat;Stairs;Stand;Transfers    Examination-Participation Restrictions  Church;Cleaning;Community Activity;Shop;Volunteer;Laundry;Yard Work    Stability/Clinical Decision Making  Stable/Uncomplicated    Clinical Decision Making  Low    Rehab Potential  Good    PT Frequency  1x / week    PT Duration  8 weeks    PT Treatment/Interventions  ADLs/Self  Care Home Management;Cryotherapy;Moist Heat;Gait training;Stair training;Functional mobility training;Therapeutic activities;Therapeutic exercise;Balance training;Neuromuscular re-education;Patient/family education;Energy conservation    PT Next Visit Plan  initiate HEP    PT Home Exercise Plan  will address next visit    Consulted and Agree with Plan of Care  Patient       Patient will benefit  from skilled therapeutic intervention in order to improve the following deficits and impairments:  Decreased balance, Decreased mobility, Difficulty walking, Cardiopulmonary status limiting activity, Decreased activity tolerance, Decreased strength  Visit Diagnosis: Muscle weakness (generalized)  Unsteadiness on feet     Problem List Patient Active Problem List   Diagnosis Date Noted  . Left-sided chest wall pain 01/10/2020  . Anorexia 01/10/2020  . Acute respiratory failure with hypoxia ([place]) 01/10/2020  . B12 deficiency 01/09/2020  . Generalized weakness 07/14/2019  . Cellulitis of left lower leg 07/06/2019  . Chest pain with high risk of acute coronary syndrome 06/18/2019  . Mild malnutrition ([place]) 03/25/2019  . Diastolic CHF, chronic ([place]) 03/25/2019  . Vitamin D deficiency 03/25/2019  . Melena 02/03/2018  . Hearing loss secondary to cerumen impaction 12/26/2017  . S/P excision of skin lesion, follow-up exam 12/26/2017  . Impingement syndrome of left shoulder region 07/04/2017  . Neck pain 07/04/2017  . Allergic rhinitis 12/31/2016  . Hospital discharge follow-up 12/31/2016  . Change in bowel movement 08/20/2016  . SCC (squamous cell carcinoma), face 08/01/2016  . EKG abnormalities 06/17/2016  . Osteoporosis of forearm 02/13/2016  . Dizziness and giddiness 02/13/2016  . Other specified symptoms and signs involving the circulatory and respiratory systems 01/31/2016  . Insomnia secondary to anxiety 07/12/2015  . Anxiety disorder 07/12/2015  . Essential (primary) hypertension 10/07/2014  . Syncope and collapse   . Sinus bradycardia   . Hardening of the aorta (main artery of the heart) ([place]) 06/26/2014  . History of Clostridium difficile colitis 10/20/2013  . Personal history of other diseases of the digestive system 10/20/2013  . Low back pain 10/04/2013  . Encounter for general adult medical examination without abnormal findings 02/07/2013  . Chest pain at rest  12/26/2011  . COPD, moderate ([place]) 12/05/2011  . Presence of aortocoronary bypass graft   . Chronic kidney disease, stage III (moderate) 09/25/2011  . Hyperlipidemia   . Hypertension   . SOB (shortness of breath)   . Coronary artery disease of native artery of native heart with stable angina pectoris ([place])   . Carotid artery disease ([place])     Joelynn Dust PT, DPT 01/30/2020, 11:34 AM  [place] MAIN [facility] SERVICES [address] [place], [place], [address] Phone: [phone]   Fax:  [phone]  Name: [name] MRN: [id number] Date of Birth: 02/01/1926

## 2020-01-31 ENCOUNTER — Telehealth: Payer: Self-pay | Admitting: Internal Medicine

## 2020-01-31 NOTE — Telephone Encounter (Signed)
Pt called and asked what to do now that she has finished b12 shots. Please advise

## 2020-01-31 NOTE — Telephone Encounter (Signed)
b12 levels are not needed when patients are receiving IM injections , only after they transition to oral meds (3 months later,  FYI)

## 2020-01-31 NOTE — Telephone Encounter (Signed)
Spoke with to let her know that she needs to come back in to have some additional lab work done so we will now if she needs to continue with b12 injections or switch to oral b12 supplements. Pt gave a verbal understanding and scheduled a lab appt.

## 2020-02-03 ENCOUNTER — Other Ambulatory Visit: Payer: Self-pay

## 2020-02-03 ENCOUNTER — Other Ambulatory Visit (INDEPENDENT_AMBULATORY_CARE_PROVIDER_SITE_OTHER): Payer: Medicare Other

## 2020-02-03 ENCOUNTER — Other Ambulatory Visit: Payer: Medicare Other

## 2020-02-03 DIAGNOSIS — E538 Deficiency of other specified B group vitamins: Secondary | ICD-10-CM | POA: Diagnosis not present

## 2020-02-04 ENCOUNTER — Ambulatory Visit: Payer: Medicare Other | Admitting: Physical Therapy

## 2020-02-05 ENCOUNTER — Encounter: Payer: Self-pay | Admitting: Cardiovascular Disease

## 2020-02-05 ENCOUNTER — Other Ambulatory Visit: Payer: Self-pay

## 2020-02-05 ENCOUNTER — Ambulatory Visit (INDEPENDENT_AMBULATORY_CARE_PROVIDER_SITE_OTHER): Payer: Medicare Other | Admitting: Cardiovascular Disease

## 2020-02-05 VITALS — BP 120/60 | HR 72 | Ht 63.5 in | Wt 126.2 lb

## 2020-02-05 DIAGNOSIS — I10 Essential (primary) hypertension: Secondary | ICD-10-CM

## 2020-02-05 DIAGNOSIS — Z951 Presence of aortocoronary bypass graft: Secondary | ICD-10-CM | POA: Diagnosis not present

## 2020-02-05 DIAGNOSIS — E782 Mixed hyperlipidemia: Secondary | ICD-10-CM

## 2020-02-05 DIAGNOSIS — I25118 Atherosclerotic heart disease of native coronary artery with other forms of angina pectoris: Secondary | ICD-10-CM | POA: Diagnosis not present

## 2020-02-05 DIAGNOSIS — I700 Atherosclerosis of aorta: Secondary | ICD-10-CM

## 2020-02-05 DIAGNOSIS — N183 Chronic kidney disease, stage 3 unspecified: Secondary | ICD-10-CM

## 2020-02-05 DIAGNOSIS — I6523 Occlusion and stenosis of bilateral carotid arteries: Secondary | ICD-10-CM | POA: Diagnosis not present

## 2020-02-05 MED ORDER — RANOLAZINE ER 500 MG PO TB12: 500.0000 mg | ORAL_TABLET | Freq: Two times a day (BID) | ORAL | 3 refills | Status: DC

## 2020-02-05 MED ORDER — IPRATROPIUM-ALBUTEROL 20-100 MCG/ACT IN AERS: 1.0000 | INHALATION_SPRAY | Freq: Four times a day (QID) | RESPIRATORY_TRACT | 6 refills | Status: DC | PRN

## 2020-02-05 NOTE — Progress Notes (Signed)
Cardiology Office Note  Date:  02/05/2020   ID:  [name], DOB [birth year], MRN [id number]  PCP:  Katrina Mc, MD   Chief Complaint  Patient presents with  . OTHER    2 month f/u echo no complaints today. Meds reviewed verbally with pt.    HPI:  [name] is a pleasant 86 year old woman with a history of  CAD, bypass surgery in 1999, Stress test October 2017 with no ischemia COPD, Smoking for 40 years who quit in 1985,  chronic renal insufficiency,  hypertension,  hyperlipidemia,  40-50% bilateral carotid arterial disease  EF >60% in 05/2015  C. difficile in January 2015. H/o fall wet floor  02/2017, Suffered a pelvic fracture, left shoulder pain who presents for routine followup of her coronary artery disease  Seen in the emergency room for dizziness and fatigue on December 30, 2019 Felt to be dehydrated, wheezing from allergies/COPD exacerbation Creatinine 1.48 Treated with low-dose prednisone several days, inhaler  NTG x 2 about 2 weeks ago Chest pain went away  Otherwise active, has not been having unstable anginal symptoms Requesting refill of Combivent Feels better when she uses her inhaler  Seen October 21, 2019 for paroxysmal tachycardia Previously with reproducible left pectoral pain on palpation Chronic mild shortness of breath  EKG personally reviewed by myself on todays visit Shows normal sinus rhythm with rate 72 bpm nonspecific T wave abnormality  Other past medical history reviewed Prior stress test reviewed with her September 2020, low risk  Stress test 02/2019 Pharmacological myocardial perfusion imaging study with no significant ischemia Unable to exclude mild ischemia lateral wall on non-attenuation corrected images Normal wall motion, EF estimated at 56% EKG with equivocal ST abnormality V2 through V5 following infusion of lexiscan.  Patient did report chest pain following infusion. Rare PVC noted Moderate risk scan , secondary to  equivocal EKG changes, chest pain during the study  Previously treated with chemotherapy for cancer on right lower extremity  Hospital admission 12/21/2016 for COPD exacerbation Syncope, dehydrated She received steroids,duo nebs, ABX   PMH:   has a past medical history of C. difficile colitis, CAD (coronary artery disease), Carotid artery disease ([name]), Closed fracture pubis ([name]) (07/04/2017), COPD (chronic obstructive pulmonary disease) ([place]), Diffuse cystic mastopathy, Diverticulitis, Dizziness, Gait abnormality, GERD (gastroesophageal reflux disease), History of migraines, CABG, Hyperlipidemia, Hypertension, Indigestion, Kidney stones, Rheumatic fever, Sinus bradycardia, SOB (shortness of breath), and Syncopal episodes.  PSH:    Past Surgical History:  Procedure Laterality Date  . APPENDECTOMY  1950  . BREAST BIOPSY Right 1994  . BREAST CYST ASPIRATION     multiple BIL  . CATARACT EXTRACTION Right 2009  . CATARACT EXTRACTION Left 2011  . COLONOSCOPY  [id number]   Dr. Jamal Allen  . CORONARY ARTERY BYPASS GRAFT  1999  . esophagus stretched    . TONSILLECTOMY  1939  . TOTAL ABDOMINAL HYSTERECTOMY  1968   due to metrorrhagia    Current Outpatient Medications  Medication Sig Dispense Refill  . amLODipine (NORVASC) 2.5 MG tablet Take 1 tablet (2.5 mg total) by mouth daily. 90 tablet 2  . atorvastatin (LIPITOR) 20 MG tablet Take 1 tablet (20 mg total) by mouth daily. 90 tablet 3  . clopidogrel (PLAVIX) 75 MG tablet Take 1 tablet (75 mg total) by mouth daily. 90 tablet 3  . isosorbide mononitrate (IMDUR) 60 MG 24 hr tablet Take 1 tablet (60 mg total) by mouth 2 (two) times daily. 180 tablet 3  . LORazepam (ATIVAN)  0.5 MG tablet TAKE ONE AND ONE-HALF TABLET BY MOUTH ASNEEDED FOR INSOMNIA 45 tablet 0  . losartan (COZAAR) 100 MG tablet TAKE ONE TABLET BY MOUTH EVERY DAY (Patient taking differently: Take 100 mg by mouth daily. ) 90 tablet 3  . mirtazapine (REMERON) 7.5 MG tablet Take 1 tablet  (7.5 mg total) by mouth at bedtime. 60 minutes before bedtime daily 30 tablet 2  . nitroGLYCERIN (NITROSTAT) 0.4 MG SL tablet PLACE 1 TABLET UNDER TONGUE EVERY 5 MIN AS NEEDED FOR CHEST PAIN IF NO RELIEF IN15 MIN CALL 911 (MAX 3 TABS) (Patient taking differently: Place 0.4 mg under the tongue every 5 (five) minutes as needed for chest pain. ) 25 tablet 1  . propranolol (INDERAL) 20 MG tablet TAKE ONE TABLET 3 TIMES DAILY AS NEEDED FOR TACHYCARDIA (Patient taking differently: Take 20 mg by mouth 3 (three) times daily as needed (tachycardia). ) 30 tablet 1  . ranolazine (RANEXA) 500 MG 12 hr tablet Take 1 tablet (500 mg total) by mouth 2 (two) times daily. 180 tablet 3  . Ipratropium-Albuterol (COMBIVENT) 20-100 MCG/ACT AERS respimat Inhale 1 puff into the lungs every 6 (six) hours as needed for wheezing. 4 g 6   No current facility-administered medications for this visit.     Allergies:   Codeine   Social History:  The patient  reports that she quit smoking about 36 years ago. Her smoking use included cigarettes. She has a 20.00 pack-year smoking history. She has never used smokeless tobacco. She reports previous alcohol use. She reports that she does not use drugs.   Family History:   family history includes Heart disease in her father and mother.    Review of Systems: Review of Systems  Constitutional: Negative.   HENT: Negative.   Respiratory: Positive for shortness of breath.   Cardiovascular: Positive for palpitations.  Gastrointestinal: Negative.   Musculoskeletal: Negative.   Neurological: Negative.   Psychiatric/Behavioral: Negative.   All other systems reviewed and are negative.   PHYSICAL EXAM: VS:  BP 120/60 (BP Location: Left Arm, Patient Position: Sitting, Cuff Size: Normal)   Pulse 72   Ht 5' 3.5" (1.613 m)   Wt 126 lb 4 oz (57.3 kg)   SpO2 97%   BMI 22.01 kg/m  , BMI Body mass index is 22.01 kg/m. Constitutional:  oriented to person, place, and time. No distress.   HENT:  Head: Grossly normal Eyes:  no discharge. No scleral icterus.  Neck: No JVD, no carotid bruits  Cardiovascular: Regular rate and rhythm, no murmurs appreciated Pulmonary/Chest: Clear to auscultation bilaterally, no wheezes or rails Abdominal: Soft.  no distension.  no tenderness.  Musculoskeletal: Normal range of motion Neurological:  normal muscle tone. Coordination normal. No atrophy Skin: Skin warm and dry Psychiatric: normal affect, pleasant  Recent Labs: 07/08/2019: ALT 12 12/30/2019: B Natriuretic Peptide 144.0; Hemoglobin 13.2; Platelets 226 01/08/2020: BUN 20; Creatinine, Ser 1.26; Potassium 4.4; Sodium 138; TSH 1.64    Lipid Panel Lab Results  Component Value Date   CHOL 120 04/02/2019   HDL 35.90 (L) 04/02/2019   LDLCALC 57 04/02/2019   TRIG 135.0 04/02/2019    Wt Readings from Last 3 Encounters:  02/05/20 126 lb 4 oz (57.3 kg)  01/09/20 126 lb (57.2 kg)  01/08/20 124 lb 3.2 oz (56.3 kg)     ASSESSMENT AND PLAN:  Essential hypertension -  Blood pressure is well controlled on today's visit. No changes made to the medications.  Coronary artery disease involving  native coronary artery of native heart without angina pectoris -  Chronic stable angina Continue current medications, continue on Ranexa  Shortness of breath/weakness Continue Combivent, refill sent in Active, playing golf  History of bypass surgery Currently with no symptoms of angina. No further workup at this time. Continue current medication regimen.  Carotid stenosis Less than 39% bilaterally, significant plaque noted Cholesterol at goal  Hyperlipidemia On Lipitor 20 daily, goal LDL less than 70 Stable    Total encounter time more than 25 minutes  Greater than 50% was spent in counseling and coordination of care with the patient   Disposition:   F/U  6 months   Orders Placed This Encounter  Procedures  . EKG 12-Lead     Signed, Esmond Plants, M.D., Ph.D. 02/05/2020  [place], [place]

## 2020-02-05 NOTE — Patient Instructions (Signed)

## 2020-02-06 LAB — METHYLMALONIC ACID, SERUM: Methylmalonic Acid, Quant: 278 nmol/L (ref 87–318)

## 2020-02-06 LAB — INTRINSIC FACTOR ANTIBODIES: Intrinsic Factor: NEGATIVE

## 2020-02-11 ENCOUNTER — Ambulatory Visit: Payer: Medicare Other | Admitting: Physical Therapy

## 2020-02-18 ENCOUNTER — Ambulatory Visit: Payer: Medicare Other | Admitting: Physical Therapy

## 2020-02-20 ENCOUNTER — Other Ambulatory Visit: Payer: Self-pay

## 2020-02-20 ENCOUNTER — Ambulatory Visit: Payer: Medicare Other | Attending: Internal Medicine

## 2020-02-20 ENCOUNTER — Ambulatory Visit: Payer: Self-pay | Admitting: *Deleted

## 2020-02-20 DIAGNOSIS — R2681 Unsteadiness on feet: Secondary | ICD-10-CM | POA: Diagnosis present

## 2020-02-20 DIAGNOSIS — M6281 Muscle weakness (generalized): Secondary | ICD-10-CM | POA: Diagnosis present

## 2020-02-20 NOTE — Therapy (Signed)
[place] MAIN [facility] SERVICES [address] [place], [place], [address] Phone: [phone]   Fax:  [phone]  Physical Therapy Treatment  Patient Details  Name: [name] MRN: [id number] Date of Birth: 20-Apr-1926 Referring Provider (PT): Derrel Nip MD   Encounter Date: 02/20/2020  PT End of Session - 02/20/20 1055    Visit Number  2    Number of Visits  9    Date for PT Re-Evaluation  03/26/20    Authorization Type  medicare    Authorization Time Period  start of care 01/30/20    PT Start Time  1027    PT Stop Time  1059    PT Time Calculation (min)  32 min    Equipment Utilized During Treatment  Gait belt    Activity Tolerance  Patient tolerated treatment well;No increased pain    Behavior During Therapy  WFL for tasks assessed/performed       Past Medical History:  Diagnosis Date  . C. difficile colitis   . CAD (coronary artery disease)    a. Nuclear 10/11, not gated, no scar or ischemia, EF 68%, low risk study; b. [place] 06/18/14: no evidence of infarction or ischemia, no WMA, normalization of TWI in precordial leads, equivocal EKG changes EF 77%, low risk study  . Carotid artery disease ([place])    Doppler January, 2012, 40-59% bilateral, followup one year  . Closed fracture pubis ([place]) 07/04/2017  . COPD (chronic obstructive pulmonary disease) ([place])   . Diffuse cystic mastopathy   . Diverticulitis    last colonoscopy  incomplete Jan 2011  . Dizziness    stable now (Oct 2011) patient tells me question of TIA, we will obtain records  . Gait abnormality    Patient complains of "wobbly gait", December, 2013  . GERD (gastroesophageal reflux disease)   . History of migraines   . Hx of CABG    a. 3 vessel in 1999  . Hyperlipidemia   . Hypertension   . Indigestion    3 weeks, October 2011  . Kidney stones   . Rheumatic fever   . Sinus bradycardia    Mild, December, 2013  . SOB (shortness of breath)   . Syncopal  episodes    after 18 holes of golf and increase hydrochlorothiazide, resolved     Past Surgical History:  Procedure Laterality Date  . APPENDECTOMY  1950  . BREAST BIOPSY Right 1994  . BREAST CYST ASPIRATION     multiple BIL  . CATARACT EXTRACTION Right 2009  . CATARACT EXTRACTION Left 2011  . COLONOSCOPY  [id number]   Dr. Jamal Collin  . CORONARY ARTERY BYPASS GRAFT  1999  . esophagus stretched    . TONSILLECTOMY  1939  . TOTAL ABDOMINAL HYSTERECTOMY  1968   due to metrorrhagia    There were no vitals filed for this visit.  Subjective Assessment - 02/20/20 1030    Subjective  Patient reports she continues to struggle with balance, but tries to stay active at home. Balance has been an issue particular with walking on uneven surfaces.    Pertinent History  86 yo Female reports getting dehydrated on 12/30/19 and had to go to the hospital. She reports she got so weak she was unable to move around and walk. She reports she has since gotten B12 shots and is feeling better but is not quite at her PLOF. She does not use any assistive devices. she lives alone in a 2  story home with her bedroom/bathroom on 2nd floor. She is able to negotiate steps well in variable manner (reciprocal and non-reciprocal). She denies any recent falls but reports feeling unsteady at times. She denies any numbness/tingling;  She reports getting short of breath with exertion especially when negotiating steps. She does have a PMH of heart disease including diastolic CHF and COPD, she has an inhaler but does not require supplemental oxygen.    Currently in Pain?  No/denies      INTERVENTION THIS DATE:  *all with minguard assist -FWD AMB high knee marching 1x8ft -retro AMB 1x26ft -Lateral side stepping 1x71ft bilat -narrow stance vertical head turns (easy, no LOB)  -narrow stance horizontal head turns (easy, no LOB) -narrow stance foam, quiet stance x 90 sec  -normal stance foam, vertical head turns x15 -normal stance  foam, horizontal head turns x15 bilat -figurae 8s on firm surface 8x (not challenging)  -figure 8s on red mat 10x (very difficult), pt reports akin to her back yard conditions, pine straw -FWD step ups/downs 16xs (cautious, no significant LOB)  -lateral step up/downs 16x (1LOB, fairly difficult) -STS from plinth, feet on airex pad 2x10 (cues to maintain knee spacing)     PT Short Term Goals - 01/30/20 1132      PT SHORT TERM GOAL #1   Title  Patient will be adherent to HEP at least 3x a week to improve functional strength and balance for better safety at home.    Time  4    Period  Weeks    Status  New    Target Date  02/27/20      PT SHORT TERM GOAL #2   Title  Patient will increase BLE gross strength to 4+/5 as to improve functional strength for independent gait, increased standing tolerance and increased ADL ability.    Time  4    Period  Weeks    Status  New    Target Date  02/27/20        PT Long Term Goals - 01/30/20 1132      PT LONG TERM GOAL #1   Title  Patient will increase 10 meter walk test to >1.28m/s as to improve gait speed for better community ambulation and to reduce fall risk.    Time  8    Period  Weeks    Status  New    Target Date  03/26/20      PT LONG TERM GOAL #2   Title  Patient will increase Berg Balance score by > 6 points to demonstrate decreased fall risk during functional activities.    Time  8    Period  Weeks    Status  New    Target Date  03/26/20      PT LONG TERM GOAL #3   Title  Patient will increase six minute walk test distance to >1200 for progression to community ambulator and improve gait ability    Time  8    Period  Weeks    Status  New    Target Date  03/26/20      PT LONG TERM GOAL #4   Title  Patient will improve FOTO score to >65/100 to indicate improved functional mobility with ADLs and community tasks.    Time  8    Period  Weeks    Status  New    Target Date  03/26/20            Plan - 02/20/20  1058     Clinical Impression Statement  Commenced dynamic/static balance program, integrating componenets of visual, proprioceptive, and vestibular system. Pt excells in many areas requiring modification to creat more challenge. Author provides minGuard assist thorughout, attemts to keep interventions gears at triggering many mild LOB that pt is able to correct independently, although author provides minA for falls recovery 2x in session. Pt asks about doing more to address her DOE/SOB. She will need more targeted ankle strengthening next session.    Personal Factors and Comorbidities  Age;Comorbidity 3+    Comorbidities  COPD/Diastolic CHF, HTN, high fall risk, former smoker,    Examination-Activity Limitations  Carry;Lift;Locomotion Level;Squat;Stairs;Stand;Transfers    Examination-Participation Restrictions  Church;Cleaning;Community Activity;Shop;Volunteer;Laundry;Yard Work    Stability/Clinical Decision Making  Stable/Uncomplicated    Designer, jewellery  Low    Rehab Potential  Good    PT Frequency  1x / week    PT Duration  8 weeks    PT Treatment/Interventions  ADLs/Self Care Home Management;Cryotherapy;Moist Heat;Gait training;Stair training;Functional mobility training;Therapeutic activities;Therapeutic exercise;Balance training;Neuromuscular re-education;Patient/family education;Energy conservation    PT Next Visit Plan  initiate HEP    PT Home Exercise Plan  will address next visit    Consulted and Agree with Plan of Care  Patient       Patient will benefit from skilled therapeutic intervention in order to improve the following deficits and impairments:  Decreased balance, Decreased mobility, Difficulty walking, Cardiopulmonary status limiting activity, Decreased activity tolerance, Decreased strength  Visit Diagnosis: Muscle weakness (generalized)  Unsteadiness on feet     Problem List Patient Active Problem List   Diagnosis Date Noted  . Left-sided chest wall pain 01/10/2020   . Anorexia 01/10/2020  . Acute respiratory failure with hypoxia ([place]) 01/10/2020  . B12 deficiency 01/09/2020  . Generalized weakness 07/14/2019  . Cellulitis of left lower leg 07/06/2019  . Chest pain with high risk of acute coronary syndrome 06/18/2019  . Mild malnutrition ([place]) 03/25/2019  . Diastolic CHF, chronic ([place]) 03/25/2019  . Vitamin D deficiency 03/25/2019  . Melena 02/03/2018  . Hearing loss secondary to cerumen impaction 12/26/2017  . S/P excision of skin lesion, follow-up exam 12/26/2017  . Impingement syndrome of left shoulder region 07/04/2017  . Neck pain 07/04/2017  . Allergic rhinitis 12/31/2016  . Hospital discharge follow-up 12/31/2016  . Change in bowel movement 08/20/2016  . SCC (squamous cell carcinoma), face 08/01/2016  . EKG abnormalities 06/17/2016  . Osteoporosis of forearm 02/13/2016  . Dizziness and giddiness 02/13/2016  . Other specified symptoms and signs involving the circulatory and respiratory systems 01/31/2016  . Insomnia secondary to anxiety 07/12/2015  . Anxiety disorder 07/12/2015  . Essential (primary) hypertension 10/07/2014  . Syncope and collapse   . Sinus bradycardia   . Hardening of the aorta (main artery of the heart) ([place]) 06/26/2014  . History of Clostridium difficile colitis 10/20/2013  . Personal history of other diseases of the digestive system 10/20/2013  . Low back pain 10/04/2013  . Encounter for general adult medical examination without abnormal findings 02/07/2013  . Chest pain at rest 12/26/2011  . COPD, moderate ([place]) 12/05/2011  . Presence of aortocoronary bypass graft   . Chronic kidney disease, stage III (moderate) 09/25/2011  . Hyperlipidemia   . Hypertension   . SOB (shortness of breath)   . Coronary artery disease of native artery of native heart with stable angina pectoris ([place])   . Carotid artery disease ([place])    11:05 AM, 02/20/20 Jackquline Berlin  Dario Ave, PT, DPT Physical Therapist - [place] [phone]     Etta Grandchild 02/20/2020, 11:04 AM  [place] MAIN [facility] SERVICES [address] [place], [place], [address] Phone: [phone]   Fax:  [phone]  Name: [name] MRN: [id number] Date of Birth: 1925/12/21

## 2020-02-25 ENCOUNTER — Ambulatory Visit: Payer: Medicare Other | Admitting: Physical Therapy

## 2020-02-26 ENCOUNTER — Other Ambulatory Visit: Payer: Self-pay | Admitting: *Deleted

## 2020-02-26 NOTE — Patient Outreach (Signed)
[facility]) Care Management  02/26/2020  [name] [birth year] [id number]   RN Health Coach Initial Assessment  Referral Date:  01/29/2020 Referral Source:  Transfer from [place] Reason for Referral:  Continued Disease Management Education Insurance:  Medicare   Outreach Attempt:  Outreach attempt #1 to patient for introduction and initial telephone assessment.  Patient answered and verified HIPAA.  RN Health Coach introduced self and role.  Patient verbally agrees to Disease Management outreaches.  Unable to complete initial assessment today due to an appointment and is requesting call back another day.   Plan:  RN Health Coach will make another outreach attempt within the month of June per patient's request.    Hubert Azure RN [place] [phone] [name]

## 2020-02-27 ENCOUNTER — Encounter: Payer: Self-pay | Admitting: Physical Therapy

## 2020-02-27 ENCOUNTER — Ambulatory Visit: Payer: Medicare Other | Admitting: Physical Therapy

## 2020-02-27 ENCOUNTER — Other Ambulatory Visit: Payer: Self-pay

## 2020-02-27 DIAGNOSIS — M6281 Muscle weakness (generalized): Secondary | ICD-10-CM

## 2020-02-27 DIAGNOSIS — R2681 Unsteadiness on feet: Secondary | ICD-10-CM

## 2020-02-27 NOTE — Therapy (Signed)
[place] [facility] SERVICES [address] [place], [place], [address] Phone: [phone]   Fax:  [phone]  Physical Therapy Treatment  Patient Details  Name: [name] MRN: [id number] Date of Birth: 1926/07/17 Referring Provider (PT): Derrel Nip MD   Encounter Date: 02/27/2020   PT End of Session - 02/27/20 1013    Visit Number 3    Number of Visits 9    Date for PT Re-Evaluation 03/26/20    Authorization Type medicare    Authorization Time Period start of care 01/30/20    PT Start Time 1015    PT Stop Time 1100    PT Time Calculation (min) 45 min    Equipment Utilized During Treatment Gait belt    Activity Tolerance Patient tolerated treatment well;No increased pain    Behavior During Therapy WFL for tasks assessed/performed           Past Medical History:  Diagnosis Date  . C. difficile colitis   . CAD (coronary artery disease)    a. Nuclear 10/11, not gated, no scar or ischemia, EF 68%, low risk study; b. [place] 06/18/14: no evidence of infarction or ischemia, no WMA, normalization of TWI in precordial leads, equivocal EKG changes EF 77%, low risk study  . Carotid artery disease ([place])    Doppler January, 2012, 40-59% bilateral, followup one year  . Closed fracture pubis ([place]) 07/04/2017  . COPD (chronic obstructive pulmonary disease) ([place])   . Diffuse cystic mastopathy   . Diverticulitis    last colonoscopy  incomplete Jan 2011  . Dizziness    stable now (Oct 2011) patient tells me question of TIA, we will obtain records  . Gait abnormality    Patient complains of "wobbly gait", December, 2013  . GERD (gastroesophageal reflux disease)   . History of migraines   . Hx of CABG    a. 3 vessel in 1999  . Hyperlipidemia   . Hypertension   . Indigestion    3 weeks, October 2011  . Kidney stones   . Rheumatic fever   . Sinus bradycardia    Mild, December, 2013  . SOB (shortness of breath)   . Syncopal  episodes    after 18 holes of golf and increase hydrochlorothiazide, resolved     Past Surgical History:  Procedure Laterality Date  . APPENDECTOMY  1950  . BREAST BIOPSY Right 1994  . BREAST CYST ASPIRATION     multiple BIL  . CATARACT EXTRACTION Right 2009  . CATARACT EXTRACTION Left 2011  . COLONOSCOPY  4503,8882   Dr. Jamal Collin  . CORONARY ARTERY BYPASS GRAFT  1999  . esophagus stretched    . TONSILLECTOMY  1939  . TOTAL ABDOMINAL HYSTERECTOMY  1968   due to metrorrhagia    There were no vitals filed for this visit.   Subjective Assessment - 02/27/20 1020    Subjective Patient reports feeling weak and unsteady yesterday and today. She denies any pain; Also denies any falls;    Pertinent History 86 yo Female reports getting dehydrated on 12/30/19 and had to go to the hospital. She reports she got so weak she was unable to move around and walk. She reports she has since gotten B12 shots and is feeling better but is not quite at her PLOF. She does not use any assistive devices. she lives alone in a 2 story home with her bedroom/bathroom on 2nd floor. She is able to negotiate steps well in  variable manner (reciprocal and non-reciprocal). She denies any recent falls but reports feeling unsteady at times. She denies any numbness/tingling;  She reports getting short of breath with exertion especially when negotiating steps. She does have a PMH of heart disease including diastolic CHF and COPD, she has an inhaler but does not require supplemental oxygen.    Currently in Pain? No/denies    Multiple Pain Sites No              TREATMENT:  Warm up on Crosstrainer level 0 x4 min (Unbilled);  Patient instructed in advanced LE strengthening and balance exercise as part of HEP:  Standing in parallel bars: -heel/toe raises x15 reps; -forward/backward walking x10 feet x5 laps unsupported with cues for erect posture and to increase step length for better gait safety;  Standing with red  tband around BLE: -hip abduction x12 reps bilaterally; -hip extension x12 reps bilaterally; -hip flexion SLR x12 reps bilaterally; -side stepping x10 feet x3 laps each direction;   Also educated patient in walking program to improve cardiovascular endurance;  Leg press: BLE plate 25# 3I14 with min VCs for proper positioning/exercise technique  NMR: Resisted walking 7.5# forward/backward x2 laps, side stepping x2 laps with CGA for safety and cues for eccentric control for better dynamic balance;  Finished with LE stretches to reduce soreness: Standing heel off step calf stretch 30 sec hold x1 rep; Seated rolling stick to each thigh x30 sec each with min VCs for proper technique;  Throughout session patient became short of breath. Vitals monitored, SPo2 >95%, HR in mid 80's.  Patient denies any pain at end of session but does report increased fatigued;  Pt provided with written HEP, she verbalized and demonstrates understanding of exercise;                     PT Education - 02/27/20 1013    Education Details balance/strength, HEP    Person(s) Educated Patient    Methods Explanation;Verbal cues    Comprehension Verbalized understanding;Returned demonstration;Verbal cues required;Need further instruction            PT Short Term Goals - 01/30/20 1132      PT SHORT TERM GOAL #1   Title Patient will be adherent to HEP at least 3x a week to improve functional strength and balance for better safety at home.    Time 4    Period Weeks    Status New    Target Date 02/27/20      PT SHORT TERM GOAL #2   Title Patient will increase BLE gross strength to 4+/5 as to improve functional strength for independent gait, increased standing tolerance and increased ADL ability.    Time 4    Period Weeks    Status New    Target Date 02/27/20             PT Long Term Goals - 01/30/20 1132      PT LONG TERM GOAL #1   Title Patient will increase 10 meter walk test to  >1.1m/s as to improve gait speed for better community ambulation and to reduce fall risk.    Time 8    Period Weeks    Status New    Target Date 03/26/20      PT LONG TERM GOAL #2   Title Patient will increase Berg Balance score by > 6 points to demonstrate decreased fall risk during functional activities.    Time 8  Period Weeks    Status New    Target Date 03/26/20      PT LONG TERM GOAL #3   Title Patient will increase six minute walk test distance to >1200 for progression to community ambulator and improve gait ability    Time 8    Period Weeks    Status New    Target Date 03/26/20      PT LONG TERM GOAL #4   Title Patient will improve FOTO score to >65/100 to indicate improved functional mobility with ADLs and community tasks.    Time 8    Period Weeks    Status New    Target Date 03/26/20                 Plan - 02/27/20 1058    Clinical Impression Statement Patient motivated and participated well within session. Initiated HEP with LE strengthening and balance exercise. Provided written instruction for better adherence; Patient required min VCs for proper exercise technique. She denies any pain with exercise but did express concern being sore after new exercise. Instructed patient in LE stretches/rolling stick to help reduce delayed onset muscle soreness. Patient would benefit from additional skilled PT intervention to improve strength, balance and mobility;    Personal Factors and Comorbidities Age;Comorbidity 3+    Comorbidities COPD/Diastolic CHF, HTN, high fall risk, former smoker,    Examination-Activity Limitations Carry;Lift;Locomotion Level;Squat;Stairs;Stand;Transfers    Examination-Participation Restrictions Church;Cleaning;Community Activity;Shop;Volunteer;Laundry;Yard Work    Stability/Clinical Decision Making Stable/Uncomplicated    Rehab Potential Good    PT Frequency 1x / week    PT Duration 8 weeks    PT Treatment/Interventions ADLs/Self Care Home  Management;Cryotherapy;Moist Heat;Gait training;Stair training;Functional mobility training;Therapeutic activities;Therapeutic exercise;Balance training;Neuromuscular re-education;Patient/family education;Energy conservation    PT Next Visit Plan initiate HEP    PT Home Exercise Plan will address next visit    Consulted and Agree with Plan of Care Patient           Patient will benefit from skilled therapeutic intervention in order to improve the following deficits and impairments:  Decreased balance, Decreased mobility, Difficulty walking, Cardiopulmonary status limiting activity, Decreased activity tolerance, Decreased strength  Visit Diagnosis: Muscle weakness (generalized)  Unsteadiness on feet     Problem List Patient Active Problem List   Diagnosis Date Noted  . Left-sided chest wall pain 01/10/2020  . Anorexia 01/10/2020  . Acute respiratory failure with hypoxia ([place]) 01/10/2020  . B12 deficiency 01/09/2020  . Generalized weakness 07/14/2019  . Cellulitis of left lower leg 07/06/2019  . Chest pain with high risk of acute coronary syndrome 06/18/2019  . Mild malnutrition ([place]) 03/25/2019  . Diastolic CHF, chronic ([place]) 03/25/2019  . Vitamin D deficiency 03/25/2019  . Melena 02/03/2018  . Hearing loss secondary to cerumen impaction 12/26/2017  . S/P excision of skin lesion, follow-up exam 12/26/2017  . Impingement syndrome of left shoulder region 07/04/2017  . Neck pain 07/04/2017  . Allergic rhinitis 12/31/2016  . Hospital discharge follow-up 12/31/2016  . Change in bowel movement 08/20/2016  . SCC (squamous cell carcinoma), face 08/01/2016  . EKG abnormalities 06/17/2016  . Osteoporosis of forearm 02/13/2016  . Dizziness and giddiness 02/13/2016  . Other specified symptoms and signs involving the circulatory and respiratory systems 01/31/2016  . Insomnia secondary to anxiety 07/12/2015  . Anxiety disorder 07/12/2015  . Essential (primary) hypertension 10/07/2014   . Syncope and collapse   . Sinus bradycardia   . Hardening of the aorta (main artery of the  heart) ([place]) 06/26/2014  . History of Clostridium difficile colitis 10/20/2013  . Personal history of other diseases of the digestive system 10/20/2013  . Low back pain 10/04/2013  . Encounter for general adult medical examination without abnormal findings 02/07/2013  . Chest pain at rest 12/26/2011  . COPD, moderate ([place]) 12/05/2011  . Presence of aortocoronary bypass graft   . Chronic kidney disease, stage III (moderate) 09/25/2011  . Hyperlipidemia   . Hypertension   . SOB (shortness of breath)   . Coronary artery disease of native artery of native heart with stable angina pectoris ([place])   . Carotid artery disease ([place])     Tabita Corbo PT, DPT 02/27/2020, 11:01 AM  [place] [facility] SERVICES [address] [place], [place], [address] Phone: [phone]   Fax:  [phone]  Name: [name] MRN: [id number] Date of Birth: 10-05-1925

## 2020-02-27 NOTE — Patient Instructions (Addendum)
Walking program:  Start walking 2-3 laps along driveway daily (as long as weather permits) at least 5 x a week  Each week add 2 laps to build up your endurance.  Access Code: [email or web address] URL: [email or web address] Date: 02/27/2020 Prepared by: Blanche East  Exercises Standing Hip Abduction with Resistance at Ankles and Counter Support - 1 x daily - 7 x weekly - 2 sets - 12 reps Standing Hip Extension with Resistance at Ankles and Counter Support - 1 x daily - 7 x weekly - 2 sets - 12 reps Standing Hip Flexion with Resistance at Ankles and Counter Support - 1 x daily - 7 x weekly - 2 sets - 12 reps Side Stepping with Resistance at Ankles and Counter Support - 1 x daily - 7 x weekly - 2 sets - 10 reps Heel rises with counter support - 1 x daily - 7 x weekly - 2 sets - 15 reps Backward Walking with Counter Support - 1 x daily - 7 x weekly - 2 sets - 5-10 reps

## 2020-02-28 ENCOUNTER — Other Ambulatory Visit: Payer: Self-pay | Admitting: Internal Medicine

## 2020-02-28 NOTE — Telephone Encounter (Signed)
Refill request for Ativan, last seen 01-07-20, last filled 12-31-19.  Please advise.

## 2020-03-03 ENCOUNTER — Ambulatory Visit: Payer: Medicare Other | Admitting: Physical Therapy

## 2020-03-05 ENCOUNTER — Ambulatory Visit: Payer: Medicare Other | Admitting: Physical Therapy

## 2020-03-09 ENCOUNTER — Other Ambulatory Visit: Payer: Self-pay

## 2020-03-09 ENCOUNTER — Ambulatory Visit (INDEPENDENT_AMBULATORY_CARE_PROVIDER_SITE_OTHER): Payer: Medicare Other | Admitting: Family

## 2020-03-09 ENCOUNTER — Other Ambulatory Visit: Payer: Self-pay | Admitting: *Deleted

## 2020-03-09 ENCOUNTER — Encounter: Payer: Self-pay | Admitting: Family

## 2020-03-09 DIAGNOSIS — R42 Dizziness and giddiness: Secondary | ICD-10-CM

## 2020-03-09 DIAGNOSIS — M7989 Other specified soft tissue disorders: Secondary | ICD-10-CM | POA: Insufficient documentation

## 2020-03-09 DIAGNOSIS — I6523 Occlusion and stenosis of bilateral carotid arteries: Secondary | ICD-10-CM | POA: Diagnosis not present

## 2020-03-09 NOTE — Assessment & Plan Note (Signed)
No edema on exam today. Breathing at baseline for copd. No adventitiious lung sounds, weight gain to suggest fluid volume overload. Discussed conservative behavioral management of BLE edema including pain more sodium, elevating legs during the day and also wearing compression stockings during the day.  Since her current compression stockings are difficult to put on/ uncomfortable, patient understands to go to medical supply store get a more comfortable pair that she can wear during the day.  Discussed that potentially amlodipine could be playing a role however since she is on a very  low-dose, we opted for conservative management today and deferred trial stop amlodipine.  She will have close follow-up

## 2020-03-09 NOTE — Progress Notes (Signed)
Patient being seen for bilateral foot swelling. States the swelling is better in the morning as she sleeps with compression hose on. By night she states she can barely walk due to the swelling.   Onset of symptoms was about a month ago. Patient does have some light headedness. Checks her bp at home but could not remember the numbers. BP was 144/7   Subjective:    Patient ID: [name], female    DOB: [birth year], 87 y.o.   MRN: [id number]  CC: [name] is a 86 y.o. female who presents today for an acute visit.    HPI: Acute visit for BLE swelling x one  Month, waxes and wanes.  Worse in the evenings Resolves while sleeping, legs elevated and wearing compression stockings  Complains of dizziness, this has been chronic. In a good place.  Drinking more water to help with dizziness. Not adding salt. No syncope. No severe ha , vision changes.  Has never been on fluid pill.   Plays golf.   No recent surgery, immobilzation. No h/o dvt.  Legs are not warm or painful.   HTN- compliant with medications  COPD- no cough, increased sputum. SOB is at baseline.   Chronic Angina- compliant with ranexa.  uses nitro 'every once and while' . Nitro resolves pain. She has regular follow up with Dr Rockey Situ.   H/o bradycardia, CAD, COPD, CKD ( crt 1.26 last)     Had been on valsartan - hctz  2013   Seen in ED 12/30/19 for fatigue and dizziness Stress test 02/2019 moderate risk scan  Ef estimated 56% [place] 02/05/20-no changes made to blood pressure medication.  Chronic stable angina-continue Ranexa SOB- continue combivent  HISTORY:  Past Medical History:  Diagnosis Date  . C. difficile colitis   . CAD (coronary artery disease)    a. Nuclear 10/11, not gated, no scar or ischemia, EF 68%, low risk study; b. [place] 06/18/14: no evidence of infarction or ischemia, no WMA, normalization of TWI in precordial leads, equivocal EKG changes EF 77%, low risk study  . Carotid artery  disease ([place])    Doppler January, 2012, 40-59% bilateral, followup one year  . Closed fracture pubis ([place]) 07/04/2017  . COPD (chronic obstructive pulmonary disease) ([place])   . Diffuse cystic mastopathy   . Diverticulitis    last colonoscopy  incomplete Jan 2011  . Dizziness    stable now (Oct 2011) patient tells me question of TIA, we will obtain records  . Gait abnormality    Patient complains of "wobbly gait", December, 2013  . GERD (gastroesophageal reflux disease)   . History of migraines   . Hx of CABG    a. 3 vessel in 1999  . Hyperlipidemia   . Hypertension   . Indigestion    3 weeks, October 2011  . Kidney stones   . Rheumatic fever   . Sinus bradycardia    Mild, December, 2013  . SOB (shortness of breath)   . Syncopal episodes    after 18 holes of golf and increase hydrochlorothiazide, resolved    Past Surgical History:  Procedure Laterality Date  . APPENDECTOMY  1950  . BREAST BIOPSY Right 1994  . BREAST CYST ASPIRATION     multiple BIL  . CATARACT EXTRACTION Right 2009  . CATARACT EXTRACTION Left 2011  . COLONOSCOPY  [id number]   Dr. Jamal Collin  . CORONARY ARTERY BYPASS GRAFT  1999  . esophagus stretched    . TONSILLECTOMY  [place]   due to metrorrhagia   Family History  Problem Relation Age of Onset  . Heart disease Mother   . Heart disease Father   . Cancer Neg Hx   . Drug abuse Neg Hx   . Breast cancer Neg Hx     Allergies: Codeine Current Outpatient Medications on File Prior to Visit  Medication Sig Dispense Refill  . amLODipine (NORVASC) 2.5 MG tablet Take 1 tablet (2.5 mg total) by mouth daily. 90 tablet 2  . atorvastatin (LIPITOR) 20 MG tablet Take 1 tablet (20 mg total) by mouth daily. 90 tablet 3  . clopidogrel (PLAVIX) 75 MG tablet Take 1 tablet (75 mg total) by mouth daily. 90 tablet 3  . Ipratropium-Albuterol (COMBIVENT) 20-100 MCG/ACT AERS respimat Inhale 1 puff into the lungs every 6 (six) hours as needed  for wheezing. 4 g 6  . isosorbide mononitrate (IMDUR) 60 MG 24 hr tablet Take 1 tablet (60 mg total) by mouth 2 (two) times daily. 180 tablet 3  . LORazepam (ATIVAN) 0.5 MG tablet TAKE ONE AND ONE-HALF TABLET BY MOUTH ASNEEDED FOR INSOMNIA 45 tablet 5  . losartan (COZAAR) 100 MG tablet TAKE ONE TABLET BY MOUTH EVERY DAY (Patient taking differently: Take 100 mg by mouth daily. ) 90 tablet 3  . mirtazapine (REMERON) 7.5 MG tablet Take 1 tablet (7.5 mg total) by mouth at bedtime. 60 minutes before bedtime daily 30 tablet 2  . nitroGLYCERIN (NITROSTAT) 0.4 MG SL tablet PLACE 1 TABLET UNDER TONGUE EVERY 5 MIN AS NEEDED FOR CHEST PAIN IF NO RELIEF IN15 MIN CALL 911 (MAX 3 TABS) (Patient taking differently: Place 0.4 mg under the tongue every 5 (five) minutes as needed for chest pain. ) 25 tablet 1  . propranolol (INDERAL) 20 MG tablet TAKE ONE TABLET 3 TIMES DAILY AS NEEDED FOR TACHYCARDIA (Patient taking differently: Take 20 mg by mouth 3 (three) times daily as needed (tachycardia). ) 30 tablet 1  . ranolazine (RANEXA) 500 MG 12 hr tablet Take 1 tablet (500 mg total) by mouth 2 (two) times daily. 180 tablet 3   No current facility-administered medications on file prior to visit.    Social History   Tobacco Use  . Smoking status: Former Smoker    Packs/day: 0.50    Years: 40.00    Pack years: 20.00    Types: Cigarettes    Quit date: 09/20/1983    Years since quitting: 36.4  . Smokeless tobacco: Never Used  Vaping Use  . Vaping Use: Never used  Substance Use Topics  . Alcohol use: Not Currently    Comment: yes, social wine  . Drug use: No    Review of Systems  Constitutional: Negative for chills and fever.  Eyes: Negative for visual disturbance.  Respiratory: Positive for shortness of breath (chronic, baseline). Negative for cough.   Cardiovascular: Positive for leg swelling. Negative for chest pain and palpitations.  Gastrointestinal: Negative for nausea and vomiting.  Neurological:  Positive for dizziness (improved). Negative for syncope and headaches.      Objective:    BP (!) 144/70   Pulse 70   Temp (!) 97 F (36.1 C) (Temporal)   Ht 5' 3.5" (1.613 m)   Wt 128 lb 12.8 oz (58.4 kg)   SpO2 98%   BMI 22.46 kg/m  BP Readings from Last 3 Encounters:  03/09/20 (!) 144/70  02/05/20 120/60  01/09/20 (!) 158/62   Wt Readings from Last  3 Encounters:  03/09/20 128 lb 12.8 oz (58.4 kg)  02/05/20 126 lb 4 oz (57.3 kg)  01/09/20 126 lb (57.2 kg)    Physical Exam Vitals reviewed.  Constitutional:      Appearance: She is well-developed.  Eyes:     Conjunctiva/sclera: Conjunctivae normal.  Cardiovascular:     Rate and Rhythm: Normal rate and regular rhythm.     Pulses: Normal pulses.     Heart sounds: Normal heart sounds.     Comments: No LE edema, palpable cords or masses. No erythema or increased warmth. No asymmetry in calf size when compared bilaterally LE hair growth symmetric and present. No discoloration. LE warm and palpable pedal pulses.  Pulmonary:     Effort: Pulmonary effort is normal.     Breath sounds: Normal breath sounds. No wheezing, rhonchi or rales.  Musculoskeletal:     Right lower leg: No edema.     Left lower leg: No edema.  Skin:    General: Skin is warm and dry.  Neurological:     Mental Status: She is alert.  Psychiatric:        Speech: Speech normal.        Behavior: Behavior normal.        Thought Content: Thought content normal.        Assessment & Plan:   Problem List Items Addressed This Visit      Other   Dizziness and giddiness    Overall improved with drinking more water. Orthostatic hypotension on exam ( see flow sheet). Advised compression stockings, careful with position changes. No change to antihypertensives today.      Leg swelling    No edema on exam today. Breathing at baseline for copd. No adventitiious lung sounds, weight gain to suggest fluid volume overload. Discussed conservative behavioral  management of BLE edema including pain more sodium, elevating legs during the day and also wearing compression stockings during the day.  Since her current compression stockings are difficult to put on/ uncomfortable, patient understands to go to medical supply store get a more comfortable pair that she can wear during the day.  Discussed that potentially amlodipine could be playing a role however since she is on a very  low-dose, we opted for conservative management today and deferred trial stop amlodipine.  She will have close follow-up           I am having [name] maintain her atorvastatin, losartan, isosorbide mononitrate, amLODipine, clopidogrel, nitroGLYCERIN, propranolol, mirtazapine, Ipratropium-Albuterol, ranolazine, and LORazepam.   No orders of the defined types were placed in this encounter.   Return precautions given.   Risks, benefits, and alternatives of the medications and treatment plan prescribed today were discussed, and patient expressed understanding.   Education regarding symptom management and diagnosis given to patient on AVS.  Continue to follow with [name], MD for routine health maintenance.   [name] and I agreed with plan.   Mable Paris, FNP 0 today. No pain.

## 2020-03-09 NOTE — Assessment & Plan Note (Signed)
Overall improved with drinking more water. Orthostatic hypotension on exam ( see flow sheet). Advised compression stockings, careful with position changes. No change to antihypertensives today.

## 2020-03-09 NOTE — Patient Outreach (Signed)
[facility]) Care Management  03/09/2020  [name] 02/12/1926 [id number]   RN Health Coach Initial Assessment  Referral Date:  01/29/2020 Referral Source:  Transfer from [place] Reason for Referral:  Continued Disease Management Education Insurance:  Medicare   Outreach Attempt:  Outreach attempt #2 to patient for initial telephone assessment. No answer. RN Health Coach left HIPAA compliant voicemail message along with contact information.  Plan:  RN Health Coach will make another outreach attempt within the month of July if no return call back from patient.   [place] [phone] [email or web address]

## 2020-03-09 NOTE — Patient Instructions (Addendum)
Continue to increase your water intake as you are doing.  I would pay closer attention to labels and sodium in the label.  Reduction of salt can help lower the the swelling.  I also suspect amlodipine might be contributory as well however we can discuss at follow-up whether or not this medication may need to be stopped. please stay on the amlodipine as you are doing for now   I would like for you to go to medical supply store to look for more comfortable compression stockings.  I think if you wore them throughout the day, your swelling would resolve.  I am hesitant to use any had a fluid pill effect make her dizziness worse.    You appear to have orthostatic hypotension for you your blood pressure gets lower as you change positions.  Like for you very careful as you change position and take your time especially as you are on upon medications for blood pressure.  Please make follow-up in 1 month to see how you are doing.   Edema  Edema is when you have too much fluid in your body or under your skin. Edema may make your legs, feet, and ankles swell up. Swelling is also common in looser tissues, like around your eyes. This is a common condition. It gets more common as you get older. There are many possible causes of edema. Eating too much salt (sodium) and being on your feet or sitting for a long time can cause edema in your legs, feet, and ankles. Hot weather may make edema worse. Edema is usually painless. Your skin may look swollen or shiny. Follow these instructions at home:  Keep the swollen body part raised (elevated) above the level of your heart when you are sitting or lying down.  Do not sit still or stand for a long time.  Do not wear tight clothes. Do not wear garters on your upper legs.  Exercise your legs. This can help the swelling go down.  Wear elastic bandages or support stockings as told by your doctor.  Eat a low-salt (low-sodium) diet to reduce fluid as told by your  doctor.  Depending on the cause of your swelling, you may need to limit how much fluid you drink (fluid restriction).  Take over-the-counter and prescription medicines only as told by your doctor. Contact a doctor if:  Treatment is not working.  You have heart, liver, or kidney disease and have symptoms of edema.  You have sudden and unexplained weight gain. Get help right away if:  You have shortness of breath or chest pain.  You cannot breathe when you lie down.  You have pain, redness, or warmth in the swollen areas.  You have heart, liver, or kidney disease and get edema all of a sudden.  You have a fever and your symptoms get worse all of a sudden. Summary  Edema is when you have too much fluid in your body or under your skin.  Edema may make your legs, feet, and ankles swell up. Swelling is also common in looser tissues, like around your eyes.  Raise (elevate) the swollen body part above the level of your heart when you are sitting or lying down.  Follow your doctor's instructions about diet and how much fluid you can drink (fluid restriction). This information is not intended to replace advice given to you by your health care provider. Make sure you discuss any questions you have with your health care provider. Document Revised: 09/08/2017 Document  Reviewed: 09/23/2016 Elsevier Patient Education  [place].  Orthostatic Hypotension Blood pressure is a measurement of how strongly, or weakly, your blood is pressing against the walls of your arteries. Orthostatic hypotension is a sudden drop in blood pressure that happens when you quickly change positions, such as when you get up from sitting or lying down. Arteries are blood vessels that carry blood from your heart throughout your body. When blood pressure is too low, you may not get enough blood to your brain or to the rest of your organs. This can cause weakness, light-headedness, rapid heartbeat, and fainting.  This can last for just a few seconds or for up to a few minutes. Orthostatic hypotension is usually not a serious problem. However, if it happens frequently or gets worse, it may be a sign of something more serious. What are the causes? This condition may be caused by:  Sudden changes in posture, such as standing up quickly after you have been sitting or lying down.  Blood loss.  Loss of body fluids (dehydration).  Heart problems.  Hormone (endocrine) problems.  Pregnancy.  Severe infection.  Lack of certain nutrients.  Severe allergic reactions (anaphylaxis).  Certain medicines, such as blood pressure medicine or medicines that make the body lose excess fluids (diuretics). Sometimes, this condition can be caused by not taking medicine as directed, such as taking too much of a certain medicine. What increases the risk? The following factors may make you more likely to develop this condition:  Age. Risk increases as you get older.  Conditions that affect the heart or the central nervous system.  Taking certain medicines, such as blood pressure medicine or diuretics.  Being pregnant. What are the signs or symptoms? Symptoms of this condition may include:  Weakness.  Light-headedness.  Dizziness.  Blurred vision.  Fatigue.  Rapid heartbeat.  Fainting, in severe cases. How is this diagnosed? This condition is diagnosed based on:  Your medical history.  Your symptoms.  Your blood pressure measurement. Your health care provider will check your blood pressure when you are: ? Lying down. ? Sitting. ? Standing. A blood pressure reading is recorded as two numbers, such as "120 over 80" (or 120/80). The first ("top") number is called the systolic pressure. It is a measure of the pressure in your arteries as your heart beats. The second ("bottom") number is called the diastolic pressure. It is a measure of the pressure in your arteries when your heart relaxes between  beats. Blood pressure is measured in a unit called mm Hg. Healthy blood pressure for most adults is 120/80. If your blood pressure is below 90/60, you may be diagnosed with hypotension. Other information or tests that may be used to diagnose orthostatic hypotension include:  Your other vital signs, such as your heart rate and temperature.  Blood tests.  Tilt table test. For this test, you will be safely secured to a table that moves you from a lying position to an upright position. Your heart rhythm and blood pressure will be monitored during the test. How is this treated? This condition may be treated by:  Changing your diet. This may involve eating more salt (sodium) or drinking more water.  Taking medicines to raise your blood pressure.  Changing the dosage of certain medicines you are taking that might be lowering your blood pressure.  Wearing compression stockings. These stockings help to prevent blood clots and reduce swelling in your legs. In some cases, you may need to go  to the hospital for:  Fluid replacement. This means you will receive fluids through an IV.  Blood replacement. This means you will receive donated blood through an IV (transfusion).  Treating an infection or heart problems, if this applies.  Monitoring. You may need to be monitored while medicines that you are taking wear off. Follow these instructions at home: Eating and drinking   Drink enough fluid to keep your urine pale yellow.  Eat a healthy diet, and follow instructions from your health care provider about eating or drinking restrictions. A healthy diet includes: ? Fresh fruits and vegetables. ? Whole grains. ? Lean meats. ? Low-fat dairy products.  Eat extra salt only as directed. Do not add extra salt to your diet unless your health care provider told you to do that.  Eat frequent, small meals.  Avoid standing up suddenly after eating. Medicines  Take over-the-counter and prescription  medicines only as told by your health care provider. ? Follow instructions from your health care provider about changing the dosage of your current medicines, if this applies. ? Do not stop or adjust any of your medicines on your own. General instructions   Wear compression stockings as told by your health care provider.  Get up slowly from lying down or sitting positions. This gives your blood pressure a chance to adjust.  Avoid hot showers and excessive heat as directed by your health care provider.  Return to your normal activities as told by your health care provider. Ask your health care provider what activities are safe for you.  Do not use any products that contain nicotine or tobacco, such as cigarettes, e-cigarettes, and chewing tobacco. If you need help quitting, ask your health care provider.  Keep all follow-up visits as told by your health care provider. This is important. Contact a health care provider if you:  Vomit.  Have diarrhea.  Have a fever for more than 2-3 days.  Feel more thirsty than usual.  Feel weak and tired. Get help right away if you:  Have chest pain.  Have a fast or irregular heartbeat.  Develop numbness in any part of your body.  Cannot move your arms or your legs.  Have trouble speaking.  Become sweaty or feel light-headed.  Faint.  Feel short of breath.  Have trouble staying awake.  Feel confused. Summary  Orthostatic hypotension is a sudden drop in blood pressure that happens when you quickly change positions.  Orthostatic hypotension is usually not a serious problem.  It is diagnosed by having your blood pressure taken lying down, sitting, and then standing.  It may be treated by changing your diet or adjusting your medicines. This information is not intended to replace advice given to you by your health care provider. Make sure you discuss any questions you have with your health care provider. Document Revised:  03/01/2018 Document Reviewed: 03/01/2018 Elsevier Patient Education  [place].

## 2020-03-10 ENCOUNTER — Ambulatory Visit: Payer: Medicare Other | Admitting: Physical Therapy

## 2020-03-12 ENCOUNTER — Ambulatory Visit: Payer: Medicare Other | Admitting: Physical Therapy

## 2020-03-12 ENCOUNTER — Encounter: Payer: Self-pay | Admitting: Physical Therapy

## 2020-03-12 ENCOUNTER — Other Ambulatory Visit: Payer: Self-pay

## 2020-03-12 DIAGNOSIS — M6281 Muscle weakness (generalized): Secondary | ICD-10-CM | POA: Diagnosis not present

## 2020-03-12 DIAGNOSIS — R2681 Unsteadiness on feet: Secondary | ICD-10-CM

## 2020-03-12 NOTE — Therapy (Signed)
[place] MAIN [facility] SERVICES [address] [place], [place], [address] Phone: [phone]   Fax:  [phone]  Physical Therapy Treatment  Patient Details  Name: [name] MRN: [id number] Date of Birth: 12-Oct-1925 Referring Provider (PT): Derrel Nip MD   Encounter Date: 03/12/2020   PT End of Session - 03/12/20 1022    Visit Number 4    Number of Visits 9    Date for PT Re-Evaluation 03/26/20    Authorization Type medicare    Authorization Time Period start of care 01/30/20    PT Start Time 1016    PT Stop Time 1100    PT Time Calculation (min) 44 min    Equipment Utilized During Treatment Gait belt    Activity Tolerance Patient tolerated treatment well;No increased pain    Behavior During Therapy WFL for tasks assessed/performed           Past Medical History:  Diagnosis Date  . C. difficile colitis   . CAD (coronary artery disease)    a. Nuclear 10/11, not gated, no scar or ischemia, EF 68%, low risk study; b. [place] 06/18/14: no evidence of infarction or ischemia, no WMA, normalization of TWI in precordial leads, equivocal EKG changes EF 77%, low risk study  . Carotid artery disease ([place])    Doppler January, 2012, 40-59% bilateral, followup one year  . Closed fracture pubis ([place]) 07/04/2017  . COPD (chronic obstructive pulmonary disease) ([place])   . Diffuse cystic mastopathy   . Diverticulitis    last colonoscopy  incomplete Jan 2011  . Dizziness    stable now (Oct 2011) patient tells me question of TIA, we will obtain records  . Gait abnormality    Patient complains of "wobbly gait", December, 2013  . GERD (gastroesophageal reflux disease)   . History of migraines   . Hx of CABG    a. 3 vessel in 1999  . Hyperlipidemia   . Hypertension   . Indigestion    3 weeks, October 2011  . Kidney stones   . Rheumatic fever   . Sinus bradycardia    Mild, December, 2013  . SOB (shortness of breath)   . Syncopal  episodes    after 18 holes of golf and increase hydrochlorothiazide, resolved     Past Surgical History:  Procedure Laterality Date  . APPENDECTOMY  1950  . BREAST BIOPSY Right 1994  . BREAST CYST ASPIRATION     multiple BIL  . CATARACT EXTRACTION Right 2009  . CATARACT EXTRACTION Left 2011  . COLONOSCOPY  [id number]   Dr. Jamal Collin  . CORONARY ARTERY BYPASS GRAFT  1999  . esophagus stretched    . TONSILLECTOMY  1939  . TOTAL ABDOMINAL HYSTERECTOMY  1968   due to metrorrhagia    There were no vitals filed for this visit.   Subjective Assessment - 03/12/20 1021    Subjective Patient reports experiencing a death in her family last week and since then hasn't been able to do as much;    Pertinent History 86 yo Female reports getting dehydrated on 12/30/19 and had to go to the hospital. She reports she got so weak she was unable to move around and walk. She reports she has since gotten B12 shots and is feeling better but is not quite at her PLOF. She does not use any assistive devices. she lives alone in a 2 story home with her bedroom/bathroom on 2nd floor. She is able to negotiate  steps well in variable manner (reciprocal and non-reciprocal). She denies any recent falls but reports feeling unsteady at times. She denies any numbness/tingling;  She reports getting short of breath with exertion especially when negotiating steps. She does have a PMH of heart disease including diastolic CHF and COPD, she has an inhaler but does not require supplemental oxygen.    Currently in Pain? No/denies    Multiple Pain Sites No              [facility] PT Assessment - 03/12/20 0001      6 Minute Walk- Baseline   HR (bpm) 75    02 Sat (%RA) 98 %      6 Minute walk- Post Test   HR (bpm) 86    02 Sat (%RA) 96 %    Modified Borg Scale for Dyspnea 2- Mild shortness of breath      6 minute walk test results    Aerobic Endurance Distance Walked 1025    Endurance additional comments without AD, good balance  and gait mechanics             TREATMENT:  Warm up with 6 min walk test, see above;   Exercise: Leg press: BLE plate 25# 1I96 with min VCs for proper positioning/exercise technique  NMR: Standing in parallel bars: On airex pad:  -heel/toe raises x10 reps; -alternate toe taps airex to 4 inch step with 2-1-0 rail assist x15 reps each side;  -Standing one foot on airex, one foot on 4 inch step:  Unsupported standing 10 sec hold  BUE ball pass side/side x5 reps each; -modified tandem stance   Unsupported standing 10 sec hold  Head turns side/side x5 reps each; Patient required min VCs for balance stability, including to increase trunk control for less loss of balance with smaller base of support   NMR: Resisted walking 7.5# forward/backward x2 laps, side stepping x2 laps (4 way) with CGA for safety and cues for eccentric control for better dynamic balance;  Throughout session patient became short of breath. Vitals monitored, SPo2 >95%, HR in mid 80's.  Patient denies any pain at end of session but does report increased fatigued;  Pt did require CGA when standing on compliant surface having a harder time with balance with less rail assist;                        PT Education - 03/12/20 1037    Education Details balance/strength, HEP    Person(s) Educated Patient    Methods Explanation;Verbal cues    Comprehension Verbalized understanding;Returned demonstration;Verbal cues required;Need further instruction            PT Short Term Goals - 01/30/20 1132      PT SHORT TERM GOAL #1   Title Patient will be adherent to HEP at least 3x a week to improve functional strength and balance for better safety at home.    Time 4    Period Weeks    Status New    Target Date 02/27/20      PT SHORT TERM GOAL #2   Title Patient will increase BLE gross strength to 4+/5 as to improve functional strength for independent gait, increased standing tolerance and  increased ADL ability.    Time 4    Period Weeks    Status New    Target Date 02/27/20             PT Long Term Goals -  01/30/20 1132      PT LONG TERM GOAL #1   Title Patient will increase 10 meter walk test to >1.67m/s as to improve gait speed for better community ambulation and to reduce fall risk.    Time 8    Period Weeks    Status New    Target Date 03/26/20      PT LONG TERM GOAL #2   Title Patient will increase Berg Balance score by > 6 points to demonstrate decreased fall risk during functional activities.    Time 8    Period Weeks    Status New    Target Date 03/26/20      PT LONG TERM GOAL #3   Title Patient will increase six minute walk test distance to >1200 for progression to community ambulator and improve gait ability    Time 8    Period Weeks    Status New    Target Date 03/26/20      PT LONG TERM GOAL #4   Title Patient will improve FOTO score to >65/100 to indicate improved functional mobility with ADLs and community tasks.    Time 8    Period Weeks    Status New    Target Date 03/26/20                 Plan - 03/12/20 1102    Clinical Impression Statement Patient motivated and participated well within session. She was instructed in 6 min walk test with no significant difference from evaluation. She does admit that she has not been able to exercise this last week as she had a death in the family and has been in mourning. Pt instructed in advanced balance exercise, utilizing compliant surface to challenge stance control. She did require CGA and had increased difficulty with less rail assist. She would benefit from additional skilled PT intervention to improve strength, balance and mobility;    Personal Factors and Comorbidities Age;Comorbidity 3+    Comorbidities COPD/Diastolic CHF, HTN, high fall risk, former smoker,    Examination-Activity Limitations Carry;Lift;Locomotion Level;Squat;Stairs;Stand;Transfers    Examination-Participation  Restrictions Church;Cleaning;Community Activity;Shop;Volunteer;Laundry;Yard Work    Stability/Clinical Decision Making Stable/Uncomplicated    Rehab Potential Good    PT Frequency 1x / week    PT Duration 8 weeks    PT Treatment/Interventions ADLs/Self Care Home Management;Cryotherapy;Moist Heat;Gait training;Stair training;Functional mobility training;Therapeutic activities;Therapeutic exercise;Balance training;Neuromuscular re-education;Patient/family education;Energy conservation    PT Next Visit Plan initiate HEP    PT Home Exercise Plan will address next visit    Consulted and Agree with Plan of Care Patient           Patient will benefit from skilled therapeutic intervention in order to improve the following deficits and impairments:  Decreased balance, Decreased mobility, Difficulty walking, Cardiopulmonary status limiting activity, Decreased activity tolerance, Decreased strength  Visit Diagnosis: Muscle weakness (generalized)  Unsteadiness on feet     Problem List Patient Active Problem List   Diagnosis Date Noted  . Leg swelling 03/09/2020  . Left-sided chest wall pain 01/10/2020  . Anorexia 01/10/2020  . Acute respiratory failure with hypoxia ([place]) 01/10/2020  . B12 deficiency 01/09/2020  . Generalized weakness 07/14/2019  . Cellulitis of left lower leg 07/06/2019  . Chest pain with high risk of acute coronary syndrome 06/18/2019  . Mild malnutrition ([place]) 03/25/2019  . Diastolic CHF, chronic ([place]) 03/25/2019  . Vitamin D deficiency 03/25/2019  . Melena 02/03/2018  . Hearing loss secondary to cerumen impaction 12/26/2017  .  S/P excision of skin lesion, follow-up exam 12/26/2017  . Impingement syndrome of left shoulder region 07/04/2017  . Neck pain 07/04/2017  . Allergic rhinitis 12/31/2016  . Hospital discharge follow-up 12/31/2016  . Change in bowel movement 08/20/2016  . SCC (squamous cell carcinoma), face 08/01/2016  . EKG abnormalities 06/17/2016  .  Osteoporosis of forearm 02/13/2016  . Dizziness and giddiness 02/13/2016  . Other specified symptoms and signs involving the circulatory and respiratory systems 01/31/2016  . Insomnia secondary to anxiety 07/12/2015  . Anxiety disorder 07/12/2015  . Essential (primary) hypertension 10/07/2014  . Syncope and collapse   . Sinus bradycardia   . Hardening of the aorta (main artery of the heart) ([place]) 06/26/2014  . History of Clostridium difficile colitis 10/20/2013  . Personal history of other diseases of the digestive system 10/20/2013  . Low back pain 10/04/2013  . Encounter for general adult medical examination without abnormal findings 02/07/2013  . Chest pain at rest 12/26/2011  . COPD, moderate ([place]) 12/05/2011  . Presence of aortocoronary bypass graft   . Chronic kidney disease, stage III (moderate) 09/25/2011  . Hyperlipidemia   . Hypertension   . SOB (shortness of breath)   . Coronary artery disease of native artery of native heart with stable angina pectoris ([place])   . Carotid artery disease ([place])     Janesia Joswick PT, DPT 03/12/2020, 11:04 AM  [place] MAIN [facility] SERVICES [address] [place], [place], [address] Phone: [phone]   Fax:  [phone]  Name: [name] MRN: [id number] Date of Birth: May 20, 1926

## 2020-03-17 ENCOUNTER — Ambulatory Visit: Payer: Medicare Other | Admitting: Physical Therapy

## 2020-03-18 ENCOUNTER — Other Ambulatory Visit: Payer: Self-pay | Admitting: *Deleted

## 2020-03-18 NOTE — Patient Outreach (Signed)
[place] [facility]) Care Management  03/18/2020  [name] [birth year] [id number]   RN Health Coach Initial Assessment  Referral Date:01/29/2020 Referral Source:Transfer from [place] Reason for Referral:Continued Disease Management Education Insurance:Medicare   Outreach Attempt:  Outreach attempt #3 to patient for initial telephone assessment. No answer. RN Health Coach left HIPAA compliant voicemail message along with contact information.  Plan:  RN Health Coach will make another outreach attempt within the month of July if no return call back from patient.   [place] [phone] [email or web address]

## 2020-03-19 ENCOUNTER — Ambulatory Visit: Payer: Medicare Other | Attending: Internal Medicine | Admitting: Physical Therapy

## 2020-03-19 ENCOUNTER — Other Ambulatory Visit: Payer: Self-pay

## 2020-03-19 ENCOUNTER — Encounter: Payer: Self-pay | Admitting: Physical Therapy

## 2020-03-19 DIAGNOSIS — M6281 Muscle weakness (generalized): Secondary | ICD-10-CM | POA: Insufficient documentation

## 2020-03-19 DIAGNOSIS — R2681 Unsteadiness on feet: Secondary | ICD-10-CM | POA: Insufficient documentation

## 2020-03-19 NOTE — Therapy (Signed)
[place] [facility] [address] [place], [place], [address] Phone: [phone]   Fax:  [phone]  Physical Therapy Treatment  Patient Details  Name: LYNCOLN MASKELL MRN: [id number] Date of Birth: [birth year] Referring Provider (PT): Derrel Nip MD   Encounter Date: 03/19/2020   PT End of Session - 03/19/20 1025    Visit Number 5    Number of Visits 9    Date for PT Re-Evaluation 03/26/20    Authorization Type PT-FOTO    Authorization Time Period start of care 01/30/20    PT Start Time 1017    PT Stop Time 1100    PT Time Calculation (min) 43 min    Equipment Utilized During Treatment Gait belt    Activity Tolerance Patient tolerated treatment well;No increased pain    Behavior During Therapy WFL for tasks assessed/performed           Past Medical History:  Diagnosis Date  . C. difficile colitis   . CAD (coronary artery disease)    a. Nuclear 10/11, not gated, no scar or ischemia, EF 68%, low risk study; b. [place] 06/18/14: no evidence of infarction or ischemia, no WMA, normalization of TWI in precordial leads, equivocal EKG changes EF 77%, low risk study  . Carotid artery disease ([place])    Doppler January, 2012, 40-59% bilateral, followup one year  . Closed fracture pubis ([place]) 07/04/2017  . COPD (chronic obstructive pulmonary disease) ([place])   . Diffuse cystic mastopathy   . Diverticulitis    last colonoscopy  incomplete Jan 2011  . Dizziness    stable now (Oct 2011) patient tells me question of TIA, we will obtain records  . Gait abnormality    Patient complains of "wobbly gait", December, 2013  . GERD (gastroesophageal reflux disease)   . History of migraines   . Hx of CABG    a. 3 vessel in 1999  . Hyperlipidemia   . Hypertension   . Indigestion    3 weeks, October 2011  . Kidney stones   . Rheumatic fever   . Sinus bradycardia    Mild, December, 2013  . SOB (shortness of breath)   . Syncopal episodes     after 18 holes of golf and increase hydrochlorothiazide, resolved     Past Surgical History:  Procedure Laterality Date  . APPENDECTOMY  1950  . BREAST BIOPSY Right 1994  . BREAST CYST ASPIRATION     multiple BIL  . CATARACT EXTRACTION Right 2009  . CATARACT EXTRACTION Left 2011  . COLONOSCOPY  [id number]   Dr. Jamal Collin  . CORONARY ARTERY BYPASS GRAFT  1999  . esophagus stretched    . TONSILLECTOMY  1939  . TOTAL ABDOMINAL HYSTERECTOMY  1968   due to metrorrhagia    There were no vitals filed for this visit.   Subjective Assessment - 03/19/20 1023    Subjective Patient reports doing well; She reports that she forgot to use her puffer this morning; She is still having some shortness of breath;    Pertinent History 86 yo Female reports getting dehydrated on 12/30/19 and had to go to the hospital. She reports she got so weak she was unable to move around and walk. She reports she has since gotten B12 shots and is feeling better but is not quite at her PLOF. She does not use any assistive devices. she lives alone in a 2 story home with her bedroom/bathroom on 2nd floor. She is  able to negotiate steps well in variable manner (reciprocal and non-reciprocal). She denies any recent falls but reports feeling unsteady at times. She denies any numbness/tingling;  She reports getting short of breath with exertion especially when negotiating steps. She does have a PMH of heart disease including diastolic CHF and COPD, she has an inhaler but does not require supplemental oxygen.    Currently in Pain? No/denies    Multiple Pain Sites No              TREATMENT: HR: 77, SPO2 99% at start of care;  Gait on treadmill 1.0- 1.5   Mph with 2 HHA on rail x5 min with cues to increase step length and improve erect posture for better gait safety; Alternated speed for cardiovascular challenge; Patient tolerated well, completed 0.11 miles distance, following gait, SPO2  98%  HR: 88     Patient  instructed in advanced LE strengthening and balance exercise as part of HEP: Seated with 2# ankle weight: LAQ x20 reps bilaterally;  Standing in parallel bars: -heel/toe raises x20 reps; -hip abduction x20 reps bilaterally; -hip extension x20 reps bilaterally; -hip flexion march x20 reps bilaterally;  Also educated patient in walking program to improve cardiovascular endurance;  Leg press: BLE plate 25# 3G18 with min VCs for proper positioning/exercise technique   Finished with gait on treadmill:   1.3-1.7   Mph with 2 HHA on rail x5 min with cues to increase step length and improve erect posture for better gait safety; Alternated speed for cardiovascular challenge; Patient tolerated well, completed 0.13 miles distance, following gait, SPO2  96%  HR: 94  Throughout session patient became short of breath. Vitals monitored, SPo2 >95%, HR in low 90's to mid 80's during exercise;   Patient denies any pain at end of session but does report increased fatigued;  Reinforced HEP; Patient verbalized understanding;                     PT Education - 03/19/20 1025    Education Details balance/strength, HEP    Person(s) Educated Patient    Methods Explanation;Verbal cues    Comprehension Verbalized understanding;Returned demonstration;Verbal cues required;Need further instruction            PT Short Term Goals - 01/30/20 1132      PT SHORT TERM GOAL #1   Title Patient will be adherent to HEP at least 3x a week to improve functional strength and balance for better safety at home.    Time 4    Period Weeks    Status New    Target Date 02/27/20      PT SHORT TERM GOAL #2   Title Patient will increase BLE gross strength to 4+/5 as to improve functional strength for independent gait, increased standing tolerance and increased ADL ability.    Time 4    Period Weeks    Status New    Target Date 02/27/20             PT Long Term Goals - 01/30/20 1132      PT  LONG TERM GOAL #1   Title Patient will increase 10 meter walk test to >1.73m/s as to improve gait speed for better community ambulation and to reduce fall risk.    Time 8    Period Weeks    Status New    Target Date 03/26/20      PT LONG TERM GOAL #2   Title Patient will increase  Berg Balance score by > 6 points to demonstrate decreased fall risk during functional activities.    Time 8    Period Weeks    Status New    Target Date 03/26/20      PT LONG TERM GOAL #3   Title Patient will increase six minute walk test distance to >1200 for progression to community ambulator and improve gait ability    Time 8    Period Weeks    Status New    Target Date 03/26/20      PT LONG TERM GOAL #4   Title Patient will improve FOTO score to >65/100 to indicate improved functional mobility with ADLs and community tasks.    Time 8    Period Weeks    Status New    Target Date 03/26/20                 Plan - 03/19/20 1119    Clinical Impression Statement Patient motivated and participated well within session. She was instructed in advanced gait training and LE strengthening exercise to facilitate cardiovascular conditioning. Vitals monitored throughout session. Despite shortness of breath, patient does exhibit good SPo2 levels, she does exhibit elevated HR which could indicate poor cardiovascular endurance. Patient does require min VCs for proper positioning/exercise technique. She was able to progress with gait speed this session with increased speed on treadmill. Patient would benefit from additional skilled PT intervention to improve strength, balance and mobility;    Personal Factors and Comorbidities Age;Comorbidity 3+    Comorbidities COPD/Diastolic CHF, HTN, high fall risk, former smoker,    Examination-Activity Limitations Carry;Lift;Locomotion Level;Squat;Stairs;Stand;Transfers    Examination-Participation Restrictions Church;Cleaning;Community Activity;Shop;Volunteer;Laundry;Yard Work     Stability/Clinical Decision Making Stable/Uncomplicated    Rehab Potential Good    PT Frequency 1x / week    PT Duration 8 weeks    PT Treatment/Interventions ADLs/Self Care Home Management;Cryotherapy;Moist Heat;Gait training;Stair training;Functional mobility training;Therapeutic activities;Therapeutic exercise;Balance training;Neuromuscular re-education;Patient/family education;Energy conservation    PT Next Visit Plan initiate HEP    PT Home Exercise Plan will address next visit    Consulted and Agree with Plan of Care Patient           Patient will benefit from skilled therapeutic intervention in order to improve the following deficits and impairments:  Decreased balance, Decreased mobility, Difficulty walking, Cardiopulmonary status limiting activity, Decreased activity tolerance, Decreased strength  Visit Diagnosis: Muscle weakness (generalized)  Unsteadiness on feet     Problem List Patient Active Problem List   Diagnosis Date Noted  . Leg swelling 03/09/2020  . Left-sided chest wall pain 01/10/2020  . Anorexia 01/10/2020  . Acute respiratory failure with hypoxia ([place]) 01/10/2020  . B12 deficiency 01/09/2020  . Generalized weakness 07/14/2019  . Cellulitis of left lower leg 07/06/2019  . Chest pain with high risk of acute coronary syndrome 06/18/2019  . Mild malnutrition ([place]) 03/25/2019  . Diastolic CHF, chronic ([place]) 03/25/2019  . Vitamin D deficiency 03/25/2019  . Melena 02/03/2018  . Hearing loss secondary to cerumen impaction 12/26/2017  . S/P excision of skin lesion, follow-up exam 12/26/2017  . Impingement syndrome of left shoulder region 07/04/2017  . Neck pain 07/04/2017  . Allergic rhinitis 12/31/2016  . Hospital discharge follow-up 12/31/2016  . Change in bowel movement 08/20/2016  . SCC (squamous cell carcinoma), face 08/01/2016  . EKG abnormalities 06/17/2016  . Osteoporosis of forearm 02/13/2016  . Dizziness and giddiness 02/13/2016  . Other  specified symptoms and signs involving the circulatory and  respiratory systems 01/31/2016  . Insomnia secondary to anxiety 07/12/2015  . Anxiety disorder 07/12/2015  . Essential (primary) hypertension 10/07/2014  . Syncope and collapse   . Sinus bradycardia   . Hardening of the aorta (main artery of the heart) ([place]) 06/26/2014  . History of Clostridium difficile colitis 10/20/2013  . Personal history of other diseases of the digestive system 10/20/2013  . Low back pain 10/04/2013  . Encounter for general adult medical examination without abnormal findings 02/07/2013  . Chest pain at rest 12/26/2011  . COPD, moderate ([place]) 12/05/2011  . Presence of aortocoronary bypass graft   . Chronic kidney disease, stage III (moderate) 09/25/2011  . Hyperlipidemia   . Hypertension   . SOB (shortness of breath)   . Coronary artery disease of native artery of native heart with stable angina pectoris ([place])   . Carotid artery disease ([place])     Nan Maya PT, DPT 03/19/2020, 11:21 AM  [place] [facility] [address] [place], [place], [address] Phone: [phone]   Fax:  [phone]  Name: WILLMA OBANDO MRN: [id number] Date of Birth: 12/06/1925

## 2020-03-20 ENCOUNTER — Other Ambulatory Visit: Payer: Self-pay | Admitting: Cardiovascular Disease

## 2020-03-24 ENCOUNTER — Ambulatory Visit: Payer: Medicare Other | Admitting: Physical Therapy

## 2020-03-24 ENCOUNTER — Other Ambulatory Visit: Payer: Self-pay | Admitting: Internal Medicine

## 2020-03-24 DIAGNOSIS — Z1231 Encounter for screening mammogram for malignant neoplasm of breast: Secondary | ICD-10-CM

## 2020-03-25 ENCOUNTER — Ambulatory Visit: Payer: Medicare Other | Admitting: Physical Therapy

## 2020-03-25 ENCOUNTER — Ambulatory Visit (INDEPENDENT_AMBULATORY_CARE_PROVIDER_SITE_OTHER): Payer: Medicare Other

## 2020-03-25 VITALS — Ht 63.5 in | Wt 128.0 lb

## 2020-03-25 DIAGNOSIS — Z0000 Encounter for general adult medical examination without abnormal findings: Secondary | ICD-10-CM

## 2020-03-25 NOTE — Patient Instructions (Addendum)
[name] , Thank you for taking time to come for your Medicare Wellness Visit. I appreciate your ongoing commitment to your health goals. Please review the following plan we discussed and let me know if I can assist you in the future.   These are the goals we discussed: Goals    . Follow up with Primary Care Provider     As needed       This is a list of the screening recommended for you and due dates:  Health Maintenance  Topic Date Due  . Flu Shot  04/19/2020  . Tetanus Vaccine  02/08/2023  . DEXA scan (bone density measurement)  Completed  . COVID-19 Vaccine  Completed  . Pneumonia vaccines  Completed   Immunizations Immunization History  Administered Date(s) Administered  . Fluad Quad(high Dose 65+) 05/15/2019  . Influenza Split 07/02/2014, 07/08/2015, 05/20/2016  . Influenza Whole 06/11/2012  . Influenza, High Dose Seasonal PF 05/30/2018  . PFIZER SARS-COV-2 Vaccination 09/23/2019, 10/14/2019  . Pneumococcal Conjugate-13 12/24/2013  . Pneumococcal Polysaccharide-23 09/16/2011, 11/09/2015  . Tdap 02/07/2013  . Unspecified SARS-COV-2 Vaccination 10/21/2019  . Zoster 07/26/2013  . Zoster Recombinat (Shingrix) 05/15/2017, 07/21/2017   Keep all routine maintenance appointments.   Follow up 04/08/20 @ 9:30  Advanced directives: End of life planning; Advance aging; Advanced directives discussed.  Copy of current HCPOA/Living Will requested.    Follow up in one year for your annual wellness visit    Preventive Care 65 Years and Older, Female Preventive care refers to lifestyle choices and visits with your health care provider that can promote health and wellness. What does preventive care include?  A yearly physical exam. This is also called an annual well check.  Dental exams once or twice a year.  Routine eye exams. Ask your health care provider how often you should have your eyes checked.  Personal lifestyle choices, including:  Daily care of your teeth  and gums.  Regular physical activity.  Eating a healthy diet.  Avoiding tobacco and drug use.  Limiting alcohol use.  Practicing safe sex.  Taking low-dose aspirin every day.  Taking vitamin and mineral supplements as recommended by your health care provider. What happens during an annual well check? The services and screenings done by your health care provider during your annual well check will depend on your age, overall health, lifestyle risk factors, and family history of disease. Counseling  Your health care provider may ask you questions about your:  Alcohol use.  Tobacco use.  Drug use.  Emotional well-being.  Home and relationship well-being.  Sexual activity.  Eating habits.  History of falls.  Memory and ability to understand (cognition).  Work and work Statistician.  Reproductive health. Screening  You may have the following tests or measurements:  Height, weight, and BMI.  Blood pressure.  Lipid and cholesterol levels. These may be checked every 5 years, or more frequently if you are over 86 years old.  Skin check.  Lung cancer screening. You may have this screening every year starting at age 65 if you have a 30-pack-year history of smoking and currently smoke or have quit within the past 15 years.  Fecal occult blood test (FOBT) of the stool. You may have this test every year starting at age 53.  Flexible sigmoidoscopy or colonoscopy. You may have a sigmoidoscopy every 5 years or a colonoscopy every 10 years starting at age 29.  Hepatitis C blood test.  Hepatitis B blood test.  Sexually transmitted  disease (STD) testing.  Diabetes screening. This is done by checking your blood sugar (glucose) after you have not eaten for a while (fasting). You may have this done every 1-3 years.  Bone density scan. This is done to screen for osteoporosis. You may have this done starting at age 60.  Mammogram. This may be done every 1-2 years. Talk to  your health care provider about how often you should have regular mammograms. Talk with your health care provider about your test results, treatment options, and if necessary, the need for more tests. Vaccines  Your health care provider may recommend certain vaccines, such as:  Influenza vaccine. This is recommended every year.  Tetanus, diphtheria, and acellular pertussis (Tdap, Td) vaccine. You may need a Td booster every 10 years.  Zoster vaccine. You may need this after age 21.  Pneumococcal 13-valent conjugate (PCV13) vaccine. One dose is recommended after age 7.  Pneumococcal polysaccharide (PPSV23) vaccine. One dose is recommended after age 86. Talk to your health care provider about which screenings and vaccines you need and how often you need them. This information is not intended to replace advice given to you by your health care provider. Make sure you discuss any questions you have with your health care provider. Document Released: 10/02/2015 Document Revised: 05/25/2016 Document Reviewed: 07/07/2015 Elsevier Interactive Patient Education  2017 [employer] Prevention in the Home Falls can cause injuries. They can happen to people of all ages. There are many things you can do to make your home safe and to help prevent falls. What can I do on the outside of my home?  Regularly fix the edges of walkways and driveways and fix any cracks.  Remove anything that might make you trip as you walk through a door, such as a raised step or threshold.  Trim any bushes or trees on the path to your home.  Use bright outdoor lighting.  Clear any walking paths of anything that might make someone trip, such as rocks or tools.  Regularly check to see if handrails are loose or broken. Make sure that both sides of any steps have handrails.  Any raised decks and porches should have guardrails on the edges.  Have any leaves, snow, or ice cleared regularly.  Use sand or salt on  walking paths during winter.  Clean up any spills in your garage right away. This includes oil or grease spills. What can I do in the bathroom?  Use night lights.  Install grab bars by the toilet and in the tub and shower. Do not use towel bars as grab bars.  Use non-skid mats or decals in the tub or shower.  If you need to sit down in the shower, use a plastic, non-slip stool.  Keep the floor dry. Clean up any water that spills on the floor as soon as it happens.  Remove soap buildup in the tub or shower regularly.  Attach bath mats securely with double-sided non-slip rug tape.  Do not have throw rugs and other things on the floor that can make you trip. What can I do in the bedroom?  Use night lights.  Make sure that you have a light by your bed that is easy to reach.  Do not use any sheets or blankets that are too big for your bed. They should not hang down onto the floor.  Have a firm chair that has side arms. You can use this for support while you get dressed.  Do not have throw rugs and other things on the floor that can make you trip. What can I do in the kitchen?  Clean up any spills right away.  Avoid walking on wet floors.  Keep items that you use a lot in easy-to-reach places.  If you need to reach something above you, use a strong step stool that has a grab bar.  Keep electrical cords out of the way.  Do not use floor polish or wax that makes floors slippery. If you must use wax, use non-skid floor wax.  Do not have throw rugs and other things on the floor that can make you trip. What can I do with my stairs?  Do not leave any items on the stairs.  Make sure that there are handrails on both sides of the stairs and use them. Fix handrails that are broken or loose. Make sure that handrails are as long as the stairways.  Check any carpeting to make sure that it is firmly attached to the stairs. Fix any carpet that is loose or worn.  Avoid having throw  rugs at the top or bottom of the stairs. If you do have throw rugs, attach them to the floor with carpet tape.  Make sure that you have a light switch at the top of the stairs and the bottom of the stairs. If you do not have them, ask someone to add them for you. What else can I do to help prevent falls?  Wear shoes that:  Do not have high heels.  Have rubber bottoms.  Are comfortable and fit you well.  Are closed at the toe. Do not wear sandals.  If you use a stepladder:  Make sure that it is fully opened. Do not climb a closed stepladder.  Make sure that both sides of the stepladder are locked into place.  Ask someone to hold it for you, if possible.  Clearly mark and make sure that you can see:  Any grab bars or handrails.  First and last steps.  Where the edge of each step is.  Use tools that help you move around (mobility aids) if they are needed. These include:  Canes.  Walkers.  Scooters.  Crutches.  Turn on the lights when you go into a dark area. Replace any light bulbs as soon as they burn out.  Set up your furniture so you have a clear path. Avoid moving your furniture around.  If any of your floors are uneven, fix them.  If there are any pets around you, be aware of where they are.  Review your medicines with your doctor. Some medicines can make you feel dizzy. This can increase your chance of falling. Ask your doctor what other things that you can do to help prevent falls. This information is not intended to replace advice given to you by your health care provider. Make sure you discuss any questions you have with your health care provider. Document Released: 07/02/2009 Document Revised: 02/11/2016 Document Reviewed: 10/10/2014 Elsevier Interactive Patient Education  2017 [employer].

## 2020-03-25 NOTE — Progress Notes (Addendum)
Subjective:   [name] is a 86 y.o. female who presents for Medicare Annual (Subsequent) preventive examination.  Review of Systems    No ROS.  Medicare Wellness Virtual Visit.    Cardiac Risk Factors include: advanced age (>87men, >68 women);hypertension     Objective:    Today's Vitals   03/25/20 0903  Weight: 128 lb (58.1 kg)  Height: 5' 3.5" (1.613 m)   Body mass index is 22.32 kg/m.  Advanced Directives 03/25/2020 01/30/2020 12/30/2019 07/08/2019 06/21/2019 06/18/2019 06/18/2019  Does Patient Have a Medical Advance Directive? Yes Yes No No Yes Yes No  Type of Advance Directive Living will [place];Living will - - [place];Living will Living will -  Does patient want to make changes to medical advance directive? No - Patient declined No - Patient declined - - No - Patient declined No - Guardian declined -  Copy of Healthcare Power of Attorney in Chart? - No - copy requested - - - - -  Would patient like information on creating a medical advance directive? - - No - Patient declined - - No - Guardian declined No - Patient declined    Current Medications (verified) Outpatient Encounter Medications as of 03/25/2020  Medication Sig   amLODipine (NORVASC) 2.5 MG tablet Take 1 tablet (2.5 mg total) by mouth daily.   atorvastatin (LIPITOR) 20 MG tablet Take 1 tablet (20 mg total) by mouth daily.   clopidogrel (PLAVIX) 75 MG tablet Take 1 tablet (75 mg total) by mouth daily.   Ipratropium-Albuterol (COMBIVENT) 20-100 MCG/ACT AERS respimat Inhale 1 puff into the lungs every 6 (six) hours as needed for wheezing.   isosorbide mononitrate (IMDUR) 60 MG 24 hr tablet Take 1 tablet (60 mg total) by mouth 2 (two) times daily.   LORazepam (ATIVAN) 0.5 MG tablet TAKE ONE AND ONE-HALF TABLET BY MOUTH ASNEEDED FOR INSOMNIA   losartan (COZAAR) 100 MG tablet TAKE ONE TABLET BY MOUTH EVERY DAY (Patient taking differently: Take 100 mg by mouth daily. )    mirtazapine (REMERON) 7.5 MG tablet Take 1 tablet (7.5 mg total) by mouth at bedtime. 60 minutes before bedtime daily   nitroGLYCERIN (NITROSTAT) 0.4 MG SL tablet PLACE 1 TABLET UNDER TONGUE EVERY 5 MIN AS NEEDED FOR CHEST PAIN IF NO RELIEF IN15 MIN CALL 911 (MAX 3 TABS)   propranolol (INDERAL) 20 MG tablet TAKE ONE TABLET 3 TIMES DAILY AS NEEDED FOR TACHYCARDIA (Patient taking differently: Take 20 mg by mouth 3 (three) times daily as needed (tachycardia). )   ranolazine (RANEXA) 500 MG 12 hr tablet Take 1 tablet (500 mg total) by mouth 2 (two) times daily.   No facility-administered encounter medications on file as of 03/25/2020.    Allergies (verified) Codeine   History: Past Medical History:  Diagnosis Date   C. difficile colitis    CAD (coronary artery disease)    a. Nuclear 10/11, not gated, no scar or ischemia, EF 68%, low risk study; b. [place] 06/18/14: no evidence of infarction or ischemia, no WMA, normalization of TWI in precordial leads, equivocal EKG changes EF 77%, low risk study   Carotid artery disease ([place])    Doppler January, 2012, 40-59% bilateral, followup one year   Closed fracture pubis ([place]) 07/04/2017   COPD (chronic obstructive pulmonary disease) ([place])    Diffuse cystic mastopathy    Diverticulitis    last colonoscopy  incomplete Jan 2011   Dizziness    stable now (Oct  2011) patient tells me question of TIA, we will obtain records   Gait abnormality    Patient complains of "wobbly gait", December, 2013   GERD (gastroesophageal reflux disease)    History of migraines    Hx of CABG    a. 3 vessel in 1999   Hyperlipidemia    Hypertension    Indigestion    3 weeks, October 2011   Kidney stones    Rheumatic fever    Sinus bradycardia    Mild, December, 2013   SOB (shortness of breath)    Syncopal episodes    after 18 holes of golf and increase hydrochlorothiazide, resolved    Past Surgical History:  Procedure Laterality Date   APPENDECTOMY  1950     BREAST BIOPSY Right 1994   BREAST CYST ASPIRATION     multiple BIL   CATARACT EXTRACTION Right 2009   CATARACT EXTRACTION Left 2011   COLONOSCOPY  [id number]   Dr. Jamal Collin   CORONARY ARTERY BYPASS GRAFT  1999   esophagus stretched     [place]   due to metrorrhagia   Family History  Problem Relation Age of Onset   Heart disease Mother    Heart disease Father    Cancer Neg Hx    Drug abuse Neg Hx    Breast cancer Neg Hx    Social History   Socioeconomic History   Marital status: Widowed    Spouse name: Not on file   Number of children: 2   Years of education: Not on file   Highest education level: Not on file  Occupational History    Employer: RETIRED  Tobacco Use   Smoking status: Former Smoker    Packs/day: 0.50    Years: 40.00    Pack years: 20.00    Types: Cigarettes    Quit date: 09/20/1983    Years since quitting: 36.5   Smokeless tobacco: Never Used  Vaping Use   Vaping Use: Never used  Substance and Sexual Activity   Alcohol use: Not Currently    Comment: yes, social wine   Drug use: No   Sexual activity: Never  Other Topics Concern   Not on file  Social History Narrative   Lives by herself, ambulates independently at baseline   Social Determinants of Health   Financial Resource Strain:    Difficulty of Paying Living Expenses:   Food Insecurity:    Worried About Charity fundraiser in the Last Year:    Arboriculturist in the Last Year:   Transportation Needs: No Transportation Needs   Lack of Transportation (Medical): No   Lack of Transportation (Non-Medical): No  Physical Activity:    Days of Exercise per Week:    Minutes of Exercise per Session:   Stress:    Feeling of Stress :   Social Connections:    Frequency of Communication with Friends and Family:    Frequency of Social Gatherings with Friends and Family:    Attends Religious Services:    Active Member of Clubs or Organizations:     Attends Music therapist:    Marital Status:     Tobacco Counseling Counseling given: Not Answered   Clinical Intake:  Pre-visit preparation completed: Yes        Diabetes: No  How often do you need to have someone help you when you read instructions, pamphlets, or other written materials from your  doctor or pharmacy?: 1 - Never   Interpreter Needed?: No      Activities of Daily Living In your present state of health, do you have any difficulty performing the following activities: 03/25/2020 07/16/2019  Hearing? Y -  Comment Hearing aid -  Vision? N -  Difficulty concentrating or making decisions? N -  Walking or climbing stairs? Y -  Comment Unsteady gait; currently doing balance exercises with PT -  Dressing or bathing? N -  Doing errands, shopping? N -  Preparing Food and eating ? N -  Using the Toilet? N N  In the past six months, have you accidently leaked urine? Y N  Comment Managed with daily liner -  Do you have problems with loss of bowel control? N N  Managing your Medications? N -  Managing your Finances? N -  Housekeeping or managing your Housekeeping? Y -  Comment Maid assist -  Some recent data might be hidden    Patient Care Team: [name], MD as PCP - General (Internal Medicine) Minna Merritts, MD as PCP - Cardiology (Cardiology) Minna Merritts, MD (Cardiology) Christene Lye, MD (General Surgery) Karlyn Agee, MD (Unknown Physician Specialty) Leona Singleton, RN as [place] any recent Medical Services you may have received from other than [facility] providers in the past year (date may be approximate).     Assessment:   This is a routine wellness examination for [name].  I connected with [name] today by telephone and verified that I am speaking with the correct person using two identifiers. Location patient: home Location provider: work Persons  participating in the virtual visit: patient, Marine scientist.    I discussed the limitations, risks, security and privacy concerns of performing an evaluation and management service by telephone and the availability of in person appointments. I also discussed with the patient that there may be a patient responsible charge related to this service. The patient expressed understanding and verbally consented to this telephonic visit.    Interactive audio and video telecommunications were attempted between this provider and patient, however failed, due to patient having technical difficulties OR patient did not have access to video capability.  We continued and completed visit with audio only.  Some vital signs may be absent or patient reported.   Hearing/Vision screen  Hearing Screening   125Hz  250Hz  500Hz  1000Hz  2000Hz  3000Hz  4000Hz  6000Hz  8000Hz   Right ear:           Left ear:           Comments: Followed by [facility] aid, bilateral   Vision Screening Comments: Followed by [facility] Wears corrective lenses Cataract extraction, bilateral Visual acuity not assessed, virtual visit.  They have seen their ophthalmologist in the last 12 months.     Dietary issues and exercise activities discussed: Current Exercise Habits: Home exercise routine, Time (Minutes): > 60, Frequency (Times/Week): 1, Weekly Exercise (Minutes/Week): 0, Intensity: Mild  Goals      Follow up with Primary Care Provider     As needed       Depression Screen [facility] 2/9 Scores 03/25/2020 03/09/2020 07/05/2019 06/21/2019 06/17/2019 03/25/2019 02/05/2019  PHQ - 2 Score 0 0 0 0 0 0 0  PHQ- 9 Score - - - - - - 0    Fall Risk Fall Risk  03/25/2020 03/09/2020 01/08/2020 09/03/2019 08/06/2019  Falls in the past year? 0 0 0 (No Data) (No Data)  Comment - - - falls screening previously completed- denies new/ recent falls falls screening previously completed- denies new/ recent falls  Number falls in past yr: 0 0 - - -    Injury with Fall? - 0 - - -  Comment - - - - -  Risk for fall due to : - - - - -  Risk for fall due to: Comment - - - - -  Follow up Falls evaluation completed Falls evaluation completed Falls evaluation completed Falls prevention discussed Falls prevention discussed   Ted hose- worn at night  Handrails in use when climbing stairs? Yes  Home free of loose throw rugs in walkways, pet beds, electrical cords, etc? Yes  Adequate lighting in your home to reduce risk of falls? Yes   ASSISTIVE DEVICES UTILIZED TO PREVENT FALLS:  Life alert? Yes  Use of a cane, walker or w/c? No  Grab bars in the bathroom? Yes  Shower chair or bench in shower? No  Elevated toilet seat or a handicapped toilet? No   TIMED UP AND GO:  Was the test performed? No .   Cognitive Function: MMSE - Mini Mental State Exam 03/25/2020 11/08/2016  Not completed: Unable to complete -  Orientation to time - 5  Orientation to Place - 5  Registration - 3  Attention/ Calculation - 5  Recall - 3  Language- name 2 objects - 2  Language- repeat - 1  Language- follow 3 step command - 3  Language- read & follow direction - 1  Write a sentence - 1  Copy design - 1  Total score - 30     6CIT Screen 03/25/2019 03/14/2018  What Year? 0 points 0 points  What month? 0 points 0 points  What time? 0 points 0 points  Count back from 20 0 points 0 points  Months in reverse 0 points 0 points  Repeat phrase 0 points 0 points  Total Score 0 0   Immunizations Immunization History  Administered Date(s) Administered   Fluad Quad(high Dose 65+) 05/15/2019   Influenza Split 07/02/2014, 07/08/2015, 05/20/2016   Influenza Whole 06/11/2012   Influenza, High Dose Seasonal PF 05/30/2018   PFIZER SARS-COV-2 Vaccination 09/23/2019, 10/14/2019   Pneumococcal Conjugate-13 12/24/2013   Pneumococcal Polysaccharide-23 09/16/2011, 11/09/2015   Tdap 02/07/2013   Unspecified SARS-COV-2 Vaccination 10/21/2019   Zoster 07/26/2013   Zoster  Recombinat (Shingrix) 05/15/2017, 07/21/2017   Health Maintenance There are no preventive care reminders to display for this patient.  Health Maintenance  Topic Date Due   INFLUENZA VACCINE  04/19/2020   TETANUS/TDAP  02/08/2023   DEXA SCAN  Completed   COVID-19 Vaccine  Completed   PNA vac Low Risk Adult  Completed   Dental Screening: Recommended annual dental exams for proper oral hygiene  Community Resource Referral / Chronic Care Management: CRR required this visit?  No   CCM required this visit?  No     Plan:   Keep all routine maintenance appointments.   Follow up 04/08/20 @ 9:30  I have personally reviewed and noted the following in the patient's chart:   Medical and social history Use of alcohol, tobacco or illicit drugs  Current medications and supplements Functional ability and status Nutritional status Physical activity Advanced directives List of other physicians Hospitalizations, surgeries, and ER visits in previous 12 months Vitals Screenings to include cognitive, depression, and falls Referrals and appointments  In addition, I have reviewed and discussed with patient certain preventive protocols, quality metrics,  and best practice recommendations. A written personalized care plan for preventive services as well as general preventive health recommendations were provided to patient via mychart.     OBrien-Blaney, Katrina Boylen L, LPN   12/21/6188      I have reviewed the above information and agree with above.   Deborra Medina, MD

## 2020-03-26 DIAGNOSIS — C44629 Squamous cell carcinoma of skin of left upper limb, including shoulder: Secondary | ICD-10-CM | POA: Diagnosis not present

## 2020-03-31 ENCOUNTER — Ambulatory Visit: Payer: Medicare Other | Admitting: Physical Therapy

## 2020-04-02 ENCOUNTER — Other Ambulatory Visit: Payer: Self-pay

## 2020-04-02 ENCOUNTER — Ambulatory Visit: Payer: Medicare Other | Admitting: Physical Therapy

## 2020-04-02 ENCOUNTER — Encounter: Payer: Self-pay | Admitting: Physical Therapy

## 2020-04-02 DIAGNOSIS — R2681 Unsteadiness on feet: Secondary | ICD-10-CM

## 2020-04-02 DIAGNOSIS — M6281 Muscle weakness (generalized): Secondary | ICD-10-CM | POA: Diagnosis not present

## 2020-04-02 NOTE — Therapy (Signed)
[place] [facility]IN [facility] [address] [place], [place], [address] Phone: [phone]   Fax:  [phone]  Physical Therapy Treatment/Discharge Summary  Patient Details  Name: [name] MRN: [id number] Date of Birth: 10-Dec-1925 Referring Provider (PT): Derrel Nip MD   Encounter Date: 04/02/2020   PT End of Session - 04/02/20 1111    Visit Number 6    Number of Visits 9    Date for PT Re-Evaluation 04/02/20    Authorization Type PT-FOTO    Authorization Time Period start of care 01/30/20    PT Start Time 1102    PT Stop Time 1145    PT Time Calculation (min) 43 min    Equipment Utilized During Treatment Gait belt    Activity Tolerance Patient tolerated treatment well;No increased pain    Behavior During Therapy WFL for tasks assessed/performed           Past Medical History:  Diagnosis Date  . C. difficile colitis   . CAD (coronary artery disease)    a. Nuclear 10/11, not gated, no scar or ischemia, EF 68%, low risk study; b. [place] 06/18/14: no evidence of infarction or ischemia, no WMA, normalization of TWI in precordial leads, equivocal EKG changes EF 77%, low risk study  . Carotid artery disease ([place])    Doppler January, 2012, 40-59% bilateral, followup one year  . Closed fracture pubis ([place]) 07/04/2017  . COPD (chronic obstructive pulmonary disease) ([place])   . Diffuse cystic mastopathy   . Diverticulitis    last colonoscopy  incomplete Jan 2011  . Dizziness    stable now (Oct 2011) patient tells me question of TIA, we will obtain records  . Gait abnormality    Patient complains of "wobbly gait", December, 2013  . GERD (gastroesophageal reflux disease)   . History of migraines   . Hx of CABG    a. 3 vessel in 1999  . Hyperlipidemia   . Hypertension   . Indigestion    3 weeks, October 2011  . Kidney stones   . Rheumatic fever   . Sinus bradycardia    Mild, December, 2013  . SOB (shortness of breath)    . Syncopal episodes    after 18 holes of golf and increase hydrochlorothiazide, resolved     Past Surgical History:  Procedure Laterality Date  . APPENDECTOMY  1950  . BREAST BIOPSY Right 1994  . BREAST CYST ASPIRATION     multiple BIL  . CATARACT EXTRACTION Right 2009  . CATARACT EXTRACTION Left 2011  . COLONOSCOPY  [id number]   Dr. Jamal Collin  . CORONARY ARTERY BYPASS GRAFT  1999  . esophagus stretched    . TONSILLECTOMY  1939  . TOTAL ABDOMINAL HYSTERECTOMY  1968   due to metrorrhagia    There were no vitals filed for this visit.   Subjective Assessment - 04/02/20 1110    Subjective Patient reports feeling increased shortness of breath. She reports she has been using her inhaler. She also has been having some chest discomfort and has been using her nitroglycerin tablets more; She reports its been worse at night especially when she lays down;    Pertinent History 86 yo Female reports getting dehydrated on 12/30/19 and had to go to the hospital. She reports she got so weak she was unable to move around and walk. She reports she has since gotten B12 shots and is feeling better but is not quite at her PLOF. She does  not use any assistive devices. she lives alone in a 2 story home with her bedroom/bathroom on 2nd floor. She is able to negotiate steps well in variable manner (reciprocal and non-reciprocal). She denies any recent falls but reports feeling unsteady at times. She denies any numbness/tingling;  She reports getting short of breath with exertion especially when negotiating steps. She does have a PMH of heart disease including diastolic CHF and COPD, she has an inhaler but does not require supplemental oxygen.    Currently in Pain? No/denies    Multiple Pain Sites No              [facility] PT Assessment - 04/02/20 0001      Strength   Right Hip Flexion 4/5    Right Hip Extension 3+/5    Right Hip ABduction 4/5    Left Hip Flexion 4+/5    Left Hip Extension 3+/5    Left Hip  ABduction 4+/5    Right Knee Flexion 5/5    Right Knee Extension 5/5    Left Knee Flexion 5/5    Left Knee Extension 5/5    Right Ankle Dorsiflexion 5/5    Left Ankle Dorsiflexion 5/5      6 Minute Walk- Baseline   BP (mmHg) 132/49    HR (bpm) 61    02 Sat (%RA) 100 %      6 Minute walk- Post Test   BP (mmHg) 153/50    HR (bpm) 86    02 Sat (%RA) 99 %      6 minute walk test results    Aerobic Endurance Distance Walked 1000    Endurance additional comments without AD,       Standardized Balance Assessment   10 Meter Walk 1.0 m/s without AD, low fall risk;      Berg Balance Test   Sit to Stand Able to stand without using hands and stabilize independently    Standing Unsupported Able to stand safely 2 minutes    Sitting with Back Unsupported but Feet Supported on Floor or Stool Able to sit safely and securely 2 minutes    Stand to Sit Sits safely with minimal use of hands    Transfers Able to transfer safely, minor use of hands    Standing Unsupported with Eyes Closed Able to stand 10 seconds safely    Standing Unsupported with Feet Together Able to place feet together independently and stand 1 minute safely    From Standing, Reach Forward with Outstretched Arm Can reach confidently >25 cm (10")    From Standing Position, Pick up Object from Floor Able to pick up shoe safely and easily    From Standing Position, Turn to Look Behind Over each Shoulder Looks behind from both sides and weight shifts well    Turn 360 Degrees Able to turn 360 degrees safely but slowly    Standing Unsupported, Alternately Place Feet on Step/Stool Able to stand independently and safely and complete 8 steps in 20 seconds    Standing Unsupported, One Foot in Front Able to plae foot ahead of the other independently and hold 30 seconds    Standing on One Leg Tries to lift leg/unable to hold 3 seconds but remains standing independently    Total Score 50    Berg comment: low risk for falls, improved from  46/56         TREATMENT: Vitals at start of session: BP: 132/49, HR 61, SPo2 100%  Instructed patient  in 6 min walk, Berg Balance Assessment and other outcome measures to address goals, see above;  Patient has made progress towards most goals and met some. She exhibits a significant improvement in strength. At this time her shortness of breath is her biggest complaint. Recommend patient follow up with PCP/cardiologist regarding shortness of breath and consider referral for cardiac/pulmonary rehab;                        PT Education - 04/02/20 1245    Education Details balance/strength, HEP    Person(s) Educated Patient    Methods Explanation;Verbal cues    Comprehension Verbalized understanding;Returned demonstration;Verbal cues required;Need further instruction            PT Short Term Goals - 04/02/20 1112      PT SHORT TERM GOAL #1   Title Patient will be adherent to HEP at least 3x a week to improve functional strength and balance for better safety at home.    Baseline 7/14: 3x a week    Time 4    Period Weeks    Status Achieved    Target Date 02/27/20      PT SHORT TERM GOAL #2   Title Patient will increase BLE gross strength to 4+/5 as to improve functional strength for independent gait, increased standing tolerance and increased ADL ability.    Time 4    Period Weeks    Status Partially Met    Target Date 02/27/20             PT Long Term Goals - 04/02/20 1130      PT LONG TERM GOAL #1   Title Patient will increase 10 meter walk test to >1.63ms as to improve gait speed for better community ambulation and to reduce fall risk.    Time 8    Period Weeks    Status Achieved      PT LONG TERM GOAL #2   Title Patient will increase Berg Balance score by > 6 points to demonstrate decreased fall risk during functional activities.    Time 8    Period Weeks    Status Partially Met    Target Date 04/02/20      PT LONG TERM GOAL #3   Title  Patient will increase six minute walk test distance to >1200 for progression to community ambulator and improve gait ability    Time 8    Period Weeks    Status Not Met    Target Date 04/02/20      PT LONG TERM GOAL #4   Title Patient will improve FOTO score to >65/100 to indicate improved functional mobility with ADLs and community tasks.    Time 8    Period Weeks    Status Not Met    Target Date 04/02/20                 Plan - 04/02/20 1249    Clinical Impression Statement Patient motivated and participated well within session. She continues to have shortness of breath. At times it is worsened with activity and then other times it will just be exacerbated at rest. Patient's vitals monitored throughout session. Her SPO2 levels are good throughout the session. Patient concerned her heart disease could be contributing to shortness of breath. She has made progress towards most goals. She exhibits a significant improvement in strength. At the time, PT recommends discharge from physical therapy. recommend pt follow back up  with PCP/cardiologist and consider cardiac/pulmonary rehab for continued physical fitness.    Personal Factors and Comorbidities Age;Comorbidity 3+    Comorbidities COPD/Diastolic CHF, HTN, high fall risk, former smoker,    Examination-Activity Limitations Carry;Lift;Locomotion Level;Squat;Stairs;Stand;Transfers    Examination-Participation Restrictions Church;Cleaning;Community Activity;Shop;Volunteer;Laundry;Yard Work    Stability/Clinical Decision Making Stable/Uncomplicated    Rehab Potential Good    PT Frequency One time visit    PT Duration --    PT Treatment/Interventions ADLs/Self Care Home Management;Cryotherapy;Moist Heat;Gait training;Stair training;Functional mobility training;Therapeutic activities;Therapeutic exercise;Balance training;Neuromuscular re-education;Patient/family education;Energy conservation    PT Next Visit Plan initiate HEP    PT Home  Exercise Plan will address next visit    Consulted and Agree with Plan of Care Patient           Patient will benefit from skilled therapeutic intervention in order to improve the following deficits and impairments:  Decreased balance, Decreased mobility, Difficulty walking, Cardiopulmonary status limiting activity, Decreased activity tolerance, Decreased strength  Visit Diagnosis: Muscle weakness (generalized)  Unsteadiness on feet     Problem List Patient Active Problem List   Diagnosis Date Noted  . Leg swelling 03/09/2020  . Left-sided chest wall pain 01/10/2020  . Anorexia 01/10/2020  . Acute respiratory failure with hypoxia ([place]) 01/10/2020  . B12 deficiency 01/09/2020  . Generalized weakness 07/14/2019  . Cellulitis of left lower leg 07/06/2019  . Chest pain with high risk of acute coronary syndrome 06/18/2019  . Mild malnutrition ([place]) 03/25/2019  . Diastolic CHF, chronic ([place]) 03/25/2019  . Vitamin D deficiency 03/25/2019  . Melena 02/03/2018  . Hearing loss secondary to cerumen impaction 12/26/2017  . S/P excision of skin lesion, follow-up exam 12/26/2017  . Impingement syndrome of left shoulder region 07/04/2017  . Neck pain 07/04/2017  . Allergic rhinitis 12/31/2016  . Hospital discharge follow-up 12/31/2016  . Change in bowel movement 08/20/2016  . SCC (squamous cell carcinoma), face 08/01/2016  . EKG abnormalities 06/17/2016  . Osteoporosis of forearm 02/13/2016  . Dizziness and giddiness 02/13/2016  . Other specified symptoms and signs involving the circulatory and respiratory systems 01/31/2016  . Insomnia secondary to anxiety 07/12/2015  . Anxiety disorder 07/12/2015  . Essential (primary) hypertension 10/07/2014  . Syncope and collapse   . Sinus bradycardia   . Hardening of the aorta (main artery of the heart) ([place]) 06/26/2014  . History of Clostridium difficile colitis 10/20/2013  . Personal history of other diseases of the digestive system  10/20/2013  . Low back pain 10/04/2013  . Encounter for general adult medical examination without abnormal findings 02/07/2013  . Chest pain at rest 12/26/2011  . COPD, moderate ([place]) 12/05/2011  . Presence of aortocoronary bypass graft   . Chronic kidney disease, stage III (moderate) 09/25/2011  . Hyperlipidemia   . Hypertension   . SOB (shortness of breath)   . Coronary artery disease of native artery of native heart with stable angina pectoris ([place])   . Carotid artery disease ([place])     [name] PT, DPT 04/02/2020, 12:54 PM  [place] [facility] SERVICES [address] [place], [place], [address] Phone: [phone]   Fax:  [phone]  Name: [name] MRN: [id number] Date of Birth: August 23, 1926

## 2020-04-03 ENCOUNTER — Encounter: Payer: Self-pay | Admitting: Nurse Practitioner

## 2020-04-03 ENCOUNTER — Other Ambulatory Visit: Payer: Self-pay

## 2020-04-03 ENCOUNTER — Telehealth: Payer: Self-pay | Admitting: Cardiovascular Disease

## 2020-04-03 ENCOUNTER — Ambulatory Visit (INDEPENDENT_AMBULATORY_CARE_PROVIDER_SITE_OTHER): Payer: Medicare Other | Admitting: Nurse Practitioner

## 2020-04-03 VITALS — BP 158/70 | HR 65 | Ht 63.0 in | Wt 128.1 lb

## 2020-04-03 DIAGNOSIS — I200 Unstable angina: Secondary | ICD-10-CM | POA: Diagnosis not present

## 2020-04-03 DIAGNOSIS — I25110 Atherosclerotic heart disease of native coronary artery with unstable angina pectoris: Secondary | ICD-10-CM

## 2020-04-03 DIAGNOSIS — E785 Hyperlipidemia, unspecified: Secondary | ICD-10-CM

## 2020-04-03 DIAGNOSIS — I10 Essential (primary) hypertension: Secondary | ICD-10-CM | POA: Diagnosis not present

## 2020-04-03 MED ORDER — ISOSORBIDE MONONITRATE ER 60 MG PO TB24: 90.0000 mg | ORAL_TABLET | Freq: Two times a day (BID) | ORAL | 3 refills | Status: DC

## 2020-04-03 NOTE — Telephone Encounter (Signed)
Patient declined lexi and wants to see Dr. Rockey Situ asap to discuss care plan.     States he brother died doing a lexi and she understood that [place] only wanted medicinal intervention for further issues.    Scheduled 7/20 with Rockey Situ

## 2020-04-03 NOTE — Patient Instructions (Signed)
Medication Instructions:  1- INCREASE Imdur Take 1.5 tablets (90 mg total) by mouth 2 (two) times daily *If you need a refill on your cardiac medications before your next appointment, please call your pharmacy*   Lab Work: None ordered If you have labs (blood work) drawn today and your tests are completely normal, you will receive your results only by: Mar[facility] Kitchen MyChart Message (if you have MyChart) OR . A paper copy in the mail If you have any lab test that is abnormal or we need to change your treatment, we will call you to review the results.   Testing/Procedures: 1- [place]  Your caregiver has ordered a Stress Test with nuclear imaging. The purpose of this test is to evaluate the blood supply to your heart muscle. This procedure is referred to as a "Non-Invasive Stress Test." This is because other than having an IV started in your vein, nothing is inserted or "invades" your body. Cardiac stress tests are done to find areas of poor blood flow to the heart by determining the extent of coronary artery disease (CAD). Some patients exercise on a treadmill, which naturally increases the blood flow to your heart, while others who are  unable to walk on a treadmill due to physical limitations have a pharmacologic/chemical stress agent called Lexiscan . This medicine will mimic walking on a treadmill by temporarily increasing your coronary blood flow.   Please note: these test may take anywhere between 2-4 hours to complete  PLEASE REPORT TO [place] AT THE FIRST DESK WILL DIRECT YOU WHERE TO GO  Date of Procedure:_____________________________________  Arrival Time for Procedure:______________________________  Instructions regarding medication:   __x__:  Hold betablocker(s) night before procedure and morning of procedure (propranolol)  PLEASE NOTIFY THE OFFICE AT LEAST 24 HOURS IN ADVANCE IF YOU ARE UNABLE TO KEEP YOUR APPOINTMENT.  [phone] AND  PLEASE  NOTIFY NUCLEAR MEDICINE AT [facility] AT LEAST 24 HOURS IN ADVANCE IF YOU ARE UNABLE TO KEEP YOUR APPOINTMENT. [phone]  How to prepare for your Myoview test:  1. Do not eat or drink after midnight 2. No caffeine for 24 hours prior to test 3. No smoking 24 hours prior to test. 4. Your medication may be taken with water.  If your doctor stopped a medication because of this test, do not take that medication. 5. Ladies, please do not wear dresses.  Skirts or pants are appropriate. Please wear a short sleeve shirt. 6. No perfume, cologne or lotion. 7. Wear comfortable walking shoes. No heels!   Follow-Up: At [facility], you and your health needs are our priority.  As part of our continuing mission to provide you with exceptional heart care, we have created designated Provider Care Teams.  These Care Teams include your primary Cardiologist (physician) and Advanced Practice Providers (APPs -  Physician Assistants and Nurse Practitioners) who all work together to provide you with the care you need, when you need it.  We recommend signing up for the patient portal called "MyChart".  Sign up information is provided on this After Visit Summary.  MyChart is used to connect with patients for Virtual Visits (Telemedicine).  Patients are able to view lab/test results, encounter notes, upcoming appointments, etc.  Non-urgent messages can be sent to your provider as well.   To learn more about what you can do with MyChart, go to [email or web address].    Your next appointment:   2 week(s)  The format for your next appointment:  In Person  Provider:    Please see Ida Rogue, MD

## 2020-04-03 NOTE — Progress Notes (Signed)
Office Visit    Patient Name: [name] Date of Encounter: 04/03/2020  Primary Care Provider:  [name], MD Primary Cardiologist:  Ida Rogue, MD  Chief Complaint    86 y/o ? w/ a h/o CAD s/p remote CABG, hypertension, hyperlipidemia, mild carotid disease, and COPD, who presents for follow-up related to unstable angina.  Past Medical History    Past Medical History:  Diagnosis Date  . C. difficile colitis   . CAD (coronary artery disease)    a. s/p CABG 1999; b. Nuclear 10/11, no scar or ischemia, EF 68%; b. [place] 06/18/14: no evidence of infarction or ischemia, EF 77%, low risk study; c. 02/2019 MV: EF 56%, ? mild lat ischemia->low risk.  . Carotid artery disease ([place])    a. Doppler January, 2012, 40-59% bilateral; b. 02/2017 U/S: 1-39% bilat ICA stenosis.  . Closed fracture pubis ([place]) 07/04/2017  . COPD (chronic obstructive pulmonary disease) ([place])   . Diffuse cystic mastopathy   . Diverticulitis    last colonoscopy  incomplete Jan 2011  . Dizziness    stable now (Oct 2011) patient tells me question of TIA, we will obtain records  . Gait abnormality    Patient complains of "wobbly gait", December, 2013  . GERD (gastroesophageal reflux disease)   . History of migraines   . Hx of CABG    a. 3 vessel in 1999  . Hyperlipidemia   . Hypertension   . Indigestion    3 weeks, October 2011  . Kidney stones   . Rheumatic fever   . Sinus bradycardia    Mild, December, 2013  . SOB (shortness of breath)   . Syncopal episodes    after 18 holes of golf and increase hydrochlorothiazide, resolved    Past Surgical History:  Procedure Laterality Date  . APPENDECTOMY  1950  . BREAST BIOPSY Right 1994  . BREAST CYST ASPIRATION     multiple BIL  . CATARACT EXTRACTION Right 2009  . CATARACT EXTRACTION Left 2011  . COLONOSCOPY  1700,1749   Dr. Jamal Collin  . CORONARY ARTERY BYPASS GRAFT  1999  . esophagus stretched    . TONSILLECTOMY  1939  . TOTAL  ABDOMINAL HYSTERECTOMY  1968   due to metrorrhagia    Allergies  Allergies  Allergen Reactions  . Codeine Nausea And Vomiting    Pt would like this removed. Not allergic to codeine.     History of Present Illness    86 year old female with a h/o CAD s/p CABG in 1999, hypertension, hyperlipidemia, mild carotid arterial disease, and COPD.  She does have a history of intermittent angina, for which she takes sublingual nitroglycerin.  She is also on twice daily Imdur and ranolazine.  She underwent stress testing in June 2020 in the setting of chest pain and this was low risk with question of mild lateral ischemia.  She was last seen in clinic in May, at which time she was stable.  Since then however, she has noted more frequent substernal chest discomfort which can be as severe as 8/10, without associated symptoms, typically occurring while lying down, lasting anywhere between 5 and 40 minutes, and resolving either with sitting up or after sublingual nitroglycerin tablet.  Symptoms seem to occur multiple times per week.  Interestingly, she participates in a rehab program and is able to walk and exercise without chest pain or significant dyspnea.  Yesterday, she went and walked for 6 minutes with no problems.  Overnight  however, she had 2 separate episodes of chest discomfort.  The first, lasted up to about 40 minutes and she took 3 sublingual nitroglycerin tablets.  The second occurred about 4 AM, waking her from sleep, and lasting just a few minutes after taking sublingual nitroglycerin.  She denies palpitations, PND, orthopnea, dizziness, syncope, or early satiety.  She does not typically experience edema when she wears her support hose.  Home Medications    Prior to Admission medications   Medication Sig Start Date End Date Taking? Authorizing Provider  amLODipine (NORVASC) 2.5 MG tablet Take 1 tablet (2.5 mg total) by mouth daily. 10/21/19  Yes Minna Merritts, MD  atorvastatin (LIPITOR) 20 MG  tablet Take 1 tablet (20 mg total) by mouth daily. 07/23/19  Yes [name], MD  clopidogrel (PLAVIX) 75 MG tablet Take 1 tablet (75 mg total) by mouth daily. 10/21/19  Yes Gollan, Kathlene November, MD  Ipratropium-Albuterol (COMBIVENT) 20-100 MCG/ACT AERS respimat Inhale 1 puff into the lungs every 6 (six) hours as needed for wheezing. 02/05/20  Yes Minna Merritts, MD  isosorbide mononitrate (IMDUR) 60 MG 24 hr tablet Take 1 tablet (60 mg total) by mouth 2 (two) times daily. 10/21/19  Yes Gollan, Kathlene November, MD  LORazepam (ATIVAN) 0.5 MG tablet TAKE ONE AND ONE-HALF TABLET BY MOUTH ASNEEDED FOR INSOMNIA 02/28/20  Yes [name], MD  losartan (COZAAR) 100 MG tablet TAKE ONE TABLET BY MOUTH EVERY DAY Patient taking differently: Take 100 mg by mouth daily.  10/21/19  Yes [name], MD  nitroGLYCERIN (NITROSTAT) 0.4 MG SL tablet PLACE 1 TABLET UNDER TONGUE EVERY 5 MIN AS NEEDED FOR CHEST PAIN IF NO RELIEF IN15 MIN CALL 911 (MAX 3 TABS) 03/20/20  Yes Gollan, Kathlene November, MD  propranolol (INDERAL) 20 MG tablet TAKE ONE TABLET 3 TIMES DAILY AS NEEDED FOR TACHYCARDIA Patient taking differently: Take 20 mg by mouth 3 (three) times daily as needed (tachycardia).  11/05/19  Yes Minna Merritts, MD  ranolazine (RANEXA) 500 MG 12 hr tablet Take 1 tablet (500 mg total) by mouth 2 (two) times daily. 02/05/20  Yes Minna Merritts, MD  Nexium 40 mg daily  Review of Systems    Increase frequency of chest discomfort, especially while lying down.  She denies exertional chest pain or dyspnea, PND, orthopnea, dizziness, syncope, or early satiety.  She will experience lower extremity swelling if she does not wear her support hose.  All other systems reviewed and are otherwise negative except as noted above.  Physical Exam    VS:  BP (!) 158/70 (BP Location: Left Arm, Patient Position: Sitting, Cuff Size: Normal)   Pulse 65   Ht 5\' 3"  (1.6 m)   Wt 128 lb 2 oz (58.1 kg)   SpO2 98%   BMI 22.70 kg/m  , BMI Body mass  index is 22.7 kg/m. GEN: Well nourished, well developed, in no acute distress. HEENT: normal. Neck: Supple, no JVD, carotid bruits, or masses. Cardiac: RRR, no murmurs, rubs, or gallops. No clubbing, cyanosis, edema.  Radials/PT 2+ and equal bilaterally.  Respiratory:  Respirations regular and unlabored, clear to auscultation bilaterally. GI: Soft, nontender, nondistended, BS + x 4. MS: no deformity or atrophy. Skin: warm and dry, no rash. Neuro:  Strength and sensation are intact. Psych: Normal affect.  Accessory Clinical Findings    ECG personally reviewed by me today -sinus rhythm, 65, mild anterolateral ST depression with biphasic T waves- no acute changes.  Lab Results  Component Value Date   WBC 9.9 12/30/2019   HGB 13.2 12/30/2019   HCT 40.8 12/30/2019   MCV 98.8 12/30/2019   PLT 226 12/30/2019   Lab Results  Component Value Date   CREATININE 1.26 (H) 01/08/2020   BUN 20 01/08/2020   NA 138 01/08/2020   K 4.4 01/08/2020   CL 102 01/08/2020   CO2 27 01/08/2020   Lab Results  Component Value Date   ALT 12 07/08/2019   AST 18 07/08/2019   ALKPHOS 50 07/08/2019   BILITOT 0.7 07/08/2019   Lab Results  Component Value Date   CHOL 120 04/02/2019   HDL 35.90 (L) 04/02/2019   LDLCALC 57 04/02/2019   LDLDIRECT 72.0 11/09/2015   TRIG 135.0 04/02/2019   CHOLHDL 3 04/02/2019    Lab Results  Component Value Date   HGBA1C 5.8 01/30/2017    Assessment & Plan    1.  Unstable angina/coronary artery disease: Status post remote CABG in 1999.  She does have a history of angina for which she uses nitrates and over the past 2 months, she has had increasing frequency of substernal chest discomfort without associated symptoms, typically occurring while lying down, lasting between 5 and 40 minutes, and resolving with either sitting up or several nitroglycerin.  She had similar episodes overnight last night.  ECG is without acute changes today.  We discussed options for  management and I will increase her Imdur to 90 mg twice daily.  She otherwise remains on statin, Plavix, ARB, calcium channel blocker and ranolazine therapy.  She is a low risk stress test last June, and we will repeat given progressive symptoms.  She was agreeable with this plan.  2.  Essential hypertension: Blood pressure is elevated today.  In the setting of chest discomfort, titrating nitrate as above.  Continue current doses of amlodipine and losartan.  3.  Hyperlipidemia: LDL of 57 last year.  She is not fasting today but is otherwise due for follow-up.  We can address at next visit.  Continue statin therapy.  4.  COPD: She does use inhalers.  No active wheezing.  5.  GERD: Her symptoms of chest pain do occur while lying down and improved with sitting up however, she is on Nexium.  She does not think her symptoms are related to GERD.  6.  Disposition: Follow-up stress testing as outlined above.  Follow-up in clinic in the next 2 to 4 weeks pending timing of testing.   Murray Hodgkins, NP 04/03/2020, 12:51 PM

## 2020-04-06 ENCOUNTER — Ambulatory Visit: Payer: Medicare Other | Admitting: Physical Therapy

## 2020-04-06 NOTE — Progress Notes (Signed)
Cardiology Office Note  Date:  04/07/2020   ID:  [name], DOB 1926/04/15, MRN [id number]  PCP:  [name], MD   Chief Complaint  Patient presents with  . other    Pt. would like to discuss having the [place] due to her age. Meds reviewed by the pt. verbally. Pt. c/o burning sensation in her chest and weakness.     HPI:  [name] is a pleasant 86 year old woman with a history of  CAD, bypass surgery in 1999, Stress test October 2017 with no ischemia COPD, Smoking for 40 years who quit in 1985,  chronic renal insufficiency,  hypertension,  hyperlipidemia,  40-50% bilateral carotid arterial disease  EF >60% in 05/2015  C. difficile in January 2015. H/o fall wet floor  02/2017, Suffered a pelvic fracture, left shoulder pain who presents for routine followup of her coronary artery disease  In follow-up today reports that she continues to have periodic angina Describes it as her chest feel hollow, Uses NTG and it helps  typically does not have to take more than 1 nitro Then able to be active after she takes nitro No NTG in 4 to 5 days Does not feel that there has been an escalation in her use of nitro  Really does not want a stress test or catheterization Wonders if she can play golf, wondering what restrictions does she have Feels the Ranexa is of benefit Higher dose isosorbide is helping  Uses inhalers for shortness of breath episodes and tightness which seems to help  We did discuss her stress test in 2020 and 2019 Prior stress test reviewed with her September 2020, low risk  EKG personally reviewed by myself on todays visit Shows normal sinus rhythm with rate 67 bpm APCs, nonspecific ST-T wave abnormality anterior precordial leads  Other past medical history reviewed Seen in the emergency room for dizziness and fatigue on December 30, 2019 Felt to be dehydrated, wheezing from allergies/COPD exacerbation Creatinine 1.48 Treated with low-dose  prednisone several days, inhaler  Seen October 21, 2019 for paroxysmal tachycardia Previously with reproducible left pectoral pain on palpation Chronic mild shortness of breath  Previously treated with chemotherapy for cancer on right lower extremity  Hospital admission 12/21/2016 for COPD exacerbation Syncope, dehydrated  PMH:   has a past medical history of C. difficile colitis, CAD (coronary artery disease), Carotid artery disease ([place]), Closed fracture pubis ([place]) (07/04/2017), COPD (chronic obstructive pulmonary disease) ([place]), Diffuse cystic mastopathy, Diverticulitis, Dizziness, Gait abnormality, GERD (gastroesophageal reflux disease), History of migraines, CABG, Hyperlipidemia, Hypertension, Indigestion, Kidney stones, Rheumatic fever, Sinus bradycardia, SOB (shortness of breath), and Syncopal episodes.  PSH:    Past Surgical History:  Procedure Laterality Date  . APPENDECTOMY  1950  . BREAST BIOPSY Right 1994  . BREAST CYST ASPIRATION     multiple BIL  . CATARACT EXTRACTION Right 2009  . CATARACT EXTRACTION Left 2011  . COLONOSCOPY  [id number]   Dr. Jamal Collin  . CORONARY ARTERY BYPASS GRAFT  1999  . esophagus stretched    . TONSILLECTOMY  1939  . TOTAL ABDOMINAL HYSTERECTOMY  1968   due to metrorrhagia    Current Outpatient Medications  Medication Sig Dispense Refill  . amLODipine (NORVASC) 2.5 MG tablet Take 1 tablet (2.5 mg total) by mouth daily. 90 tablet 2  . atorvastatin (LIPITOR) 20 MG tablet Take 1 tablet (20 mg total) by mouth daily. 90 tablet 3  . clopidogrel (PLAVIX) 75 MG tablet Take 1 tablet (75 mg  total) by mouth daily. 90 tablet 3  . esomeprazole (NEXIUM) 40 MG capsule Take 40 mg by mouth daily at 12 noon.    . Ipratropium-Albuterol (COMBIVENT) 20-100 MCG/ACT AERS respimat Inhale 1 puff into the lungs every 6 (six) hours as needed for wheezing. 4 g 6  . isosorbide mononitrate (IMDUR) 60 MG 24 hr tablet Take 1.5 tablets (90 mg total) by mouth 2 (two) times daily.  270 tablet 3  . LORazepam (ATIVAN) 0.5 MG tablet TAKE ONE AND ONE-HALF TABLET BY MOUTH ASNEEDED FOR INSOMNIA 45 tablet 5  . losartan (COZAAR) 100 MG tablet TAKE ONE TABLET BY MOUTH EVERY DAY (Patient taking differently: Take 100 mg by mouth daily. ) 90 tablet 3  . nitroGLYCERIN (NITROSTAT) 0.4 MG SL tablet PLACE 1 TABLET UNDER TONGUE EVERY 5 MIN AS NEEDED FOR CHEST PAIN IF NO RELIEF IN15 MIN CALL 911 (MAX 3 TABS) 25 tablet 1  . propranolol (INDERAL) 20 MG tablet TAKE ONE TABLET 3 TIMES DAILY AS NEEDED FOR TACHYCARDIA (Patient taking differently: Take 20 mg by mouth 3 (three) times daily as needed (tachycardia). ) 30 tablet 1  . ranolazine (RANEXA) 500 MG 12 hr tablet Take 1 tablet (500 mg total) by mouth 2 (two) times daily. 180 tablet 3   No current facility-administered medications for this visit.     Allergies:   Codeine   Social History:  The patient  reports that she quit smoking about 36 years ago. Her smoking use included cigarettes. She has a 20.00 pack-year smoking history. She has never used smokeless tobacco. She reports previous alcohol use. She reports that she does not use drugs.   Family History:   family history includes Heart disease in her father and mother.    Review of Systems: Review of Systems  Constitutional: Negative.   HENT: Negative.   Respiratory: Negative.   Cardiovascular: Positive for chest pain.  Gastrointestinal: Negative.   Musculoskeletal: Negative.   Neurological: Negative.   Psychiatric/Behavioral: Negative.   All other systems reviewed and are negative.   PHYSICAL EXAM: VS:  BP 140/60 (BP Location: Left Arm, Patient Position: Sitting, Cuff Size: Normal)   Pulse 68   Ht 5\' 3"  (1.6 m)   Wt 127 lb 6 oz (57.8 kg)   SpO2 95%   BMI 22.56 kg/m  , BMI Body mass index is 22.56 kg/m. Constitutional:  oriented to person, place, and time. No distress.  HENT:  Head: Grossly normal Eyes:  no discharge. No scleral icterus.  Neck: No JVD, no carotid  bruits  Cardiovascular: Regular rate and rhythm, no murmurs appreciated Pulmonary/Chest: Clear to auscultation bilaterally, no wheezes or rails Abdominal: Soft.  no distension.  no tenderness.  Musculoskeletal: Normal range of motion Neurological:  normal muscle tone. Coordination normal. No atrophy Skin: Skin warm and dry Psychiatric: normal affect, pleasant  Recent Labs: 07/08/2019: ALT 12 12/30/2019: B Natriuretic Peptide 144.0; Hemoglobin 13.2; Platelets 226 01/08/2020: BUN 20; Creatinine, Ser 1.26; Potassium 4.4; Sodium 138; TSH 1.64    Lipid Panel Lab Results  Component Value Date   CHOL 120 04/02/2019   HDL 35.90 (L) 04/02/2019   LDLCALC 57 04/02/2019   TRIG 135.0 04/02/2019    Wt Readings from Last 3 Encounters:  04/07/20 127 lb 6 oz (57.8 kg)  04/03/20 128 lb 2 oz (58.1 kg)  03/25/20 128 lb (58.1 kg)     ASSESSMENT AND PLAN:  Essential hypertension -  Blood pressure is well controlled on today's visit. No changes made to  the medications. Stable  Coronary artery disease involving native coronary artery of native heart without angina pectoris -  Chronic stable angina No escalation in her nitro use She is finding some relief on Ranexa and high-dose isosorbide 90 twice daily No increase use of sublingual nitro She is not interested in stress testing or catheterization at this time Discussed that if she starts escalating use of her sublingual nitroglycerin that she should call our office, this would likely warrant further testing Currently taking nitro once maybe twice a week with good resolution of her symptoms  Shortness of breath/weakness Recommend she try to stay active, use her inhalers Suggest she restart her golf program though not too aggressive  History of bypass surgery Discussed management of chronic anginal symptoms as detailed above  Carotid stenosis Less than 39% bilaterally, significant plaque noted Cholesterol at goal  Hyperlipidemia Stay on  Lipitor, LDL at goal No changes made to her medications    Total encounter time more than 25 minutes  Greater than 50% was spent in counseling and coordination of care with the patient   Disposition:   F/U  6 months   No orders of the defined types were placed in this encounter.    Signed, Esmond Plants, M.D., Ph.D. 04/07/2020  [place], [place]

## 2020-04-07 ENCOUNTER — Encounter: Payer: Self-pay | Admitting: Cardiovascular Disease

## 2020-04-07 ENCOUNTER — Other Ambulatory Visit: Payer: Self-pay

## 2020-04-07 ENCOUNTER — Ambulatory Visit (INDEPENDENT_AMBULATORY_CARE_PROVIDER_SITE_OTHER): Payer: Medicare Other | Admitting: Cardiovascular Disease

## 2020-04-07 VITALS — BP 140/60 | HR 68 | Ht 63.0 in | Wt 127.4 lb

## 2020-04-07 DIAGNOSIS — N183 Chronic kidney disease, stage 3 unspecified: Secondary | ICD-10-CM

## 2020-04-07 DIAGNOSIS — E785 Hyperlipidemia, unspecified: Secondary | ICD-10-CM | POA: Diagnosis not present

## 2020-04-07 DIAGNOSIS — I10 Essential (primary) hypertension: Secondary | ICD-10-CM | POA: Diagnosis not present

## 2020-04-07 DIAGNOSIS — R5383 Other fatigue: Secondary | ICD-10-CM | POA: Diagnosis not present

## 2020-04-07 DIAGNOSIS — I25110 Atherosclerotic heart disease of native coronary artery with unstable angina pectoris: Secondary | ICD-10-CM | POA: Diagnosis not present

## 2020-04-07 DIAGNOSIS — I700 Atherosclerosis of aorta: Secondary | ICD-10-CM | POA: Diagnosis not present

## 2020-04-07 DIAGNOSIS — I6523 Occlusion and stenosis of bilateral carotid arteries: Secondary | ICD-10-CM

## 2020-04-07 DIAGNOSIS — E782 Mixed hyperlipidemia: Secondary | ICD-10-CM | POA: Diagnosis not present

## 2020-04-07 DIAGNOSIS — I200 Unstable angina: Secondary | ICD-10-CM | POA: Diagnosis not present

## 2020-04-07 DIAGNOSIS — Z951 Presence of aortocoronary bypass graft: Secondary | ICD-10-CM

## 2020-04-07 MED ORDER — AMLODIPINE BESYLATE 2.5 MG PO TABS: 2.5000 mg | ORAL_TABLET | Freq: Every day | ORAL | 3 refills | Status: DC

## 2020-04-07 MED ORDER — CLOPIDOGREL BISULFATE 75 MG PO TABS: 75.0000 mg | ORAL_TABLET | Freq: Every day | ORAL | 3 refills | Status: DC

## 2020-04-07 MED ORDER — RANOLAZINE ER 500 MG PO TB12: 500.0000 mg | ORAL_TABLET | Freq: Two times a day (BID) | ORAL | 3 refills | Status: DC

## 2020-04-07 MED ORDER — ISOSORBIDE MONONITRATE ER 60 MG PO TB24: 90.0000 mg | ORAL_TABLET | Freq: Two times a day (BID) | ORAL | 3 refills | Status: DC

## 2020-04-07 MED ORDER — LOSARTAN POTASSIUM 100 MG PO TABS: 100.0000 mg | ORAL_TABLET | Freq: Every day | ORAL | 3 refills | Status: DC

## 2020-04-07 MED ORDER — ATORVASTATIN CALCIUM 20 MG PO TABS: 20.0000 mg | ORAL_TABLET | Freq: Every day | ORAL | 3 refills | Status: DC

## 2020-04-07 MED ORDER — PROPRANOLOL HCL 20 MG PO TABS: ORAL_TABLET | ORAL | 3 refills | Status: DC

## 2020-04-07 MED ORDER — NITROGLYCERIN 0.4 MG SL SUBL: SUBLINGUAL_TABLET | SUBLINGUAL | 3 refills | Status: DC

## 2020-04-07 NOTE — Patient Instructions (Signed)

## 2020-04-08 ENCOUNTER — Encounter: Payer: Self-pay | Admitting: Internal Medicine

## 2020-04-08 ENCOUNTER — Ambulatory Visit (INDEPENDENT_AMBULATORY_CARE_PROVIDER_SITE_OTHER): Payer: Medicare Other | Admitting: Internal Medicine

## 2020-04-08 DIAGNOSIS — I10 Essential (primary) hypertension: Secondary | ICD-10-CM | POA: Diagnosis not present

## 2020-04-08 DIAGNOSIS — N183 Chronic kidney disease, stage 3 unspecified: Secondary | ICD-10-CM | POA: Diagnosis not present

## 2020-04-08 DIAGNOSIS — R079 Chest pain, unspecified: Secondary | ICD-10-CM

## 2020-04-08 DIAGNOSIS — F4321 Adjustment disorder with depressed mood: Secondary | ICD-10-CM | POA: Insufficient documentation

## 2020-04-08 DIAGNOSIS — I200 Unstable angina: Secondary | ICD-10-CM

## 2020-04-08 DIAGNOSIS — Z8719 Personal history of other diseases of the digestive system: Secondary | ICD-10-CM | POA: Insufficient documentation

## 2020-04-08 NOTE — Assessment & Plan Note (Signed)
Diagnosis reviewed with patient Renal function is at baseline with avoidance of NSAIDs.  She is on an ARB for control of hypertension,, and statin for control of hyperlipidemia.   Lab Results  Component Value Date   CREATININE 1.26 (H) 01/08/2020   Lab Results  Component Value Date   NA 138 01/08/2020   K 4.4 01/08/2020   CL 102 01/08/2020   CO2 27 01/08/2020

## 2020-04-08 NOTE — Progress Notes (Signed)
Subjective:  Patient ID: [name], female    DOB: 31-Jul-1926  Age: 86 y.o. MRN: [id number]  CC: Diagnoses of Stage 3 chronic kidney disease, unspecified whether stage 3a or 3b CKD, Essential (primary) hypertension, Grief, History of esophageal stricture, and Chest pain at rest were pertinent to this visit.  HPI [name] presents for follow up on chronic issues  1) has been having recurrent episodes of SSCP occurring both at rest when supine and when sitting up,  But not during physical rehab .  Episodes have not reliably responded to NTG.   Saw cardiology,  Dose of Imdur increased and symptoms have improved.   2) has a feeling of dysphagia without regurgitation or choking.  Points to a spot on the left side of her neck that she states feels "tight"   3) Grief:  Her grandson died one month ago from a drug overdose.  She is worried abut her daughter's emotional health  Outpatient Medications Prior to Visit  Medication Sig Dispense Refill  . amLODipine (NORVASC) 2.5 MG tablet Take 1 tablet (2.5 mg total) by mouth daily. 90 tablet 3  . atorvastatin (LIPITOR) 20 MG tablet Take 1 tablet (20 mg total) by mouth daily. 90 tablet 3  . clopidogrel (PLAVIX) 75 MG tablet Take 1 tablet (75 mg total) by mouth daily. 90 tablet 3  . esomeprazole (NEXIUM) 40 MG capsule Take 40 mg by mouth daily at 12 noon.    . Ipratropium-Albuterol (COMBIVENT) 20-100 MCG/ACT AERS respimat Inhale 1 puff into the lungs every 6 (six) hours as needed for wheezing. 4 g 6  . isosorbide mononitrate (IMDUR) 60 MG 24 hr tablet Take 1.5 tablets (90 mg total) by mouth 2 (two) times daily. 270 tablet 3  . LORazepam (ATIVAN) 0.5 MG tablet TAKE ONE AND ONE-HALF TABLET BY MOUTH ASNEEDED FOR INSOMNIA 45 tablet 5  . losartan (COZAAR) 100 MG tablet Take 1 tablet (100 mg total) by mouth daily. 90 tablet 3  . nitroGLYCERIN (NITROSTAT) 0.4 MG SL tablet PLACE 1 TABLET UNDER TONGUE EVERY 5 MIN AS NEEDED FOR CHEST PAIN IF NO  RELIEF IN15 MIN CALL 911 (MAX 3 TABS) 25 tablet 3  . propranolol (INDERAL) 20 MG tablet TAKE ONE TABLET 3 TIMES DAILY AS NEEDED FOR TACHYCARDIA 270 tablet 3  . ranolazine (RANEXA) 500 MG 12 hr tablet Take 1 tablet (500 mg total) by mouth 2 (two) times daily. 180 tablet 3   No facility-administered medications prior to visit.    Review of Systems;  Patient denies headache, fevers, malaise, unintentional weight loss, skin rash, eye pain, sinus congestion and sinus pain, sore throat, dysphagia,  hemoptysis , cough, dyspnea, wheezing, chest pain, palpitations, orthopnea, edema, abdominal pain, nausea, melena, diarrhea, constipation, flank pain, dysuria, hematuria, urinary  Frequency, nocturia, numbness, tingling, seizures,  Focal weakness, Loss of consciousness,  Tremor, insomnia, depression, anxiety, and suicidal ideation.      Objective:  BP 126/60 (BP Location: Left Arm, Patient Position: Sitting, Cuff Size: Normal)   Pulse 71   Temp 97.7 F (36.5 C) (Oral)   Resp 16   Ht 5\' 3"  (1.6 m)   Wt 127 lb 3.2 oz (57.7 kg)   SpO2 98%   BMI 22.53 kg/m   BP Readings from Last 3 Encounters:  04/08/20 126/60  04/07/20 140/60  04/03/20 (!) 158/70    Wt Readings from Last 3 Encounters:  04/08/20 127 lb 3.2 oz (57.7 kg)  04/07/20 127 lb 6 oz (57.8 kg)  04/03/20 128 lb 2 oz (58.1 kg)    General appearance: alert, cooperative and appears stated age Ears: normal TM's and external ear canals both ears Throat: lips, mucosa, and tongue normal; teeth and gums normal Neck: no adenopathy, no carotid bruit, supple, symmetrical, trachea midline and thyroid not enlarged, symmetric, no tenderness/mass/nodules Back: symmetric, no curvature. ROM normal. No CVA tenderness. Lungs: clear to auscultation bilaterally Heart: regular rate and rhythm, S1, S2 normal, no murmur, click, rub or gallop Abdomen: soft, non-tender; bowel sounds normal; no masses,  no organomegaly Pulses: 2+ and symmetric Skin: Skin  color, texture, turgor normal. No rashes or lesions Lymph nodes: Cervical, supraclavicular, and axillary nodes normal.  Lab Results  Component Value Date   HGBA1C 5.8 01/30/2017   HGBA1C 5.8 09/26/2016    Lab Results  Component Value Date   CREATININE 1.26 (H) 01/08/2020   CREATININE 1.48 (H) 12/30/2019   CREATININE 1.19 (H) 07/08/2019    Lab Results  Component Value Date   WBC 9.9 12/30/2019   HGB 13.2 12/30/2019   HCT 40.8 12/30/2019   PLT 226 12/30/2019   GLUCOSE 109 (H) 01/08/2020   CHOL 120 04/02/2019   TRIG 135.0 04/02/2019   HDL 35.90 (L) 04/02/2019   LDLDIRECT 72.0 11/09/2015   LDLCALC 57 04/02/2019   ALT 12 07/08/2019   AST 18 07/08/2019   NA 138 01/08/2020   K 4.4 01/08/2020   CL 102 01/08/2020   CREATININE 1.26 (H) 01/08/2020   BUN 20 01/08/2020   CO2 27 01/08/2020   TSH 1.64 01/08/2020   INR 0.9 01/31/2018   HGBA1C 5.8 01/30/2017   MICROALBUR 2.5 (H) 12/26/2011    DG Chest 2 View  Result Date: 12/30/2019 CLINICAL DATA:  Weakness and dizziness EXAM: CHEST - 2 VIEW COMPARISON:  June 18, 2019 FINDINGS: Lungs are clear. Heart size and pulmonary vascularity are normal. Patient is status post coronary artery bypass grafting. There is aortic atherosclerosis. There also foci calcification in each subclavian artery. No adenopathy. 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  Bones are osteoporotic. IMPRESSION: Lungs clear. Cardiac silhouette within normal limits. Postoperative changes noted. Sizable paraesophageal hernia noted. Aortic Atherosclerosis (ICD10-I70.0).  Bones osteoporotic. Electronically Signed   By: Lowella Grip III M.D.   On: 12/30/2019 11:43    Assessment & Plan:   Problem List Items Addressed This Visit      Unprioritized   Chest pain at rest    Atypical  , occurring at rest despite use of   Consider referral to GI for manometry to rule out nutcracker esophagus if symptoms persist.       Chronic kidney disease, stage III (moderate)      Diagnosis reviewed with patient Renal function is at baseline with avoidance of NSAIDs.  She is on an ARB for control of hypertension,, and statin for control of hyperlipidemia.   Lab Results  Component Value Date   CREATININE 1.26 (H) 01/08/2020   Lab Results  Component Value Date   NA 138 01/08/2020   K 4.4 01/08/2020   CL 102 01/08/2020   CO2 27 01/08/2020         Essential (primary) hypertension    Well controlled on current regimen. Renal function stable, no changes today.        Grief    Patient is dealing with the unexpected loss of her grandson and has adequate coping skills and emotional support .  i have asked patient to return in one month to examine for signs  of unresolving grief.       History of esophageal stricture    Current symptoms are mild,  She has deferred workup at this time. Would begin with DG esophagus          I am having [name] maintain her Ipratropium-Albuterol, LORazepam, esomeprazole, amLODipine, atorvastatin, clopidogrel, isosorbide mononitrate, losartan, nitroGLYCERIN, propranolol, and ranolazine.  No orders of the defined types were placed in this encounter.   There are no discontinued medications.  Follow-up: No follow-ups on file.   [name], MD

## 2020-04-08 NOTE — Assessment & Plan Note (Signed)
Patient is dealing with the unexpected loss of her grandson and has adequate coping skills and emotional support .  i have asked patient to return in one month to examine for signs of unresolving grief.

## 2020-04-08 NOTE — Assessment & Plan Note (Signed)
Current symptoms are mild,  She has deferred workup at this time. Would begin with DG esophagus

## 2020-04-08 NOTE — Assessment & Plan Note (Signed)
Well controlled on current regimen. Renal function stable, no changes today. 

## 2020-04-08 NOTE — Assessment & Plan Note (Signed)
Atypical  , occurring at rest despite use of   Consider referral to GI for manometry to rule out nutcracker esophagus if symptoms persist.

## 2020-04-10 ENCOUNTER — Other Ambulatory Visit: Payer: Self-pay | Admitting: Internal Medicine

## 2020-04-14 ENCOUNTER — Other Ambulatory Visit: Payer: Self-pay | Admitting: *Deleted

## 2020-04-14 NOTE — Patient Outreach (Signed)
[place] [facility]) Care Management  04/14/2020  ZAYLI VILLAFUERTE Aug 30, 1926 [id number]   RN Health Coach Initial Assessment  Referral Date:01/29/2020 Referral Source:Transfer from [place] Reason for Referral:Continued Disease Management Education Insurance:Medicare   Outreach Attempt:  Outreach attempt #4 to patient for initial telephone assessment. No answer. RN Health Coach left HIPAA compliant voicemail message along with contact information.  Plan:  RN Health Coach will make another outreach attempt within the month of August if no return call back from patient.   [place] [phone] [email or web address]

## 2020-04-15 ENCOUNTER — Ambulatory Visit
Admission: RE | Admit: 2020-04-15 | Discharge: 2020-04-15 | Disposition: A | Discharge status: Home / Self Care | Payer: Medicare Other | Source: Ambulatory Visit | Attending: Internal Medicine | Admitting: Internal Medicine

## 2020-04-15 DIAGNOSIS — Z1231 Encounter for screening mammogram for malignant neoplasm of breast: Secondary | ICD-10-CM

## 2020-04-17 ENCOUNTER — Other Ambulatory Visit: Payer: Medicare Other

## 2020-04-22 ENCOUNTER — Other Ambulatory Visit: Payer: Self-pay | Admitting: *Deleted

## 2020-04-22 NOTE — Patient Outreach (Signed)
[place] [facility]) Care Management  04/22/2020  [name] [birth year] [id number]   RN Health Coach Initial Assessment  Referral Date:01/29/2020 Referral Source:Transfer from [place] Reason for Referral:Continued Disease Management Education Insurance:Medicare   Outreach Attempt:  Outreach attempt #5 to patient for initial telephone assessment. No answer. RN Health Coach left HIPAA compliant voicemail message along with contact information.  Plan:  RN Health Coach will make another outreach attempt within the month of September if no return call back from patient.   [place] Coach [phone] [email or web address]

## 2020-04-29 ENCOUNTER — Ambulatory Visit: Payer: Medicare Other | Admitting: Cardiovascular Disease

## 2020-05-13 DIAGNOSIS — Z961 Presence of intraocular lens: Secondary | ICD-10-CM | POA: Diagnosis not present

## 2020-05-19 DIAGNOSIS — K219 Gastro-esophageal reflux disease without esophagitis: Secondary | ICD-10-CM | POA: Diagnosis not present

## 2020-05-19 DIAGNOSIS — F458 Other somatoform disorders: Secondary | ICD-10-CM | POA: Diagnosis not present

## 2020-05-26 ENCOUNTER — Telehealth: Payer: Self-pay | Admitting: Pharmacist

## 2020-05-26 ENCOUNTER — Other Ambulatory Visit: Payer: Self-pay | Admitting: *Deleted

## 2020-05-26 ENCOUNTER — Encounter: Payer: Self-pay | Admitting: *Deleted

## 2020-05-26 DIAGNOSIS — I10 Essential (primary) hypertension: Secondary | ICD-10-CM

## 2020-05-26 DIAGNOSIS — N183 Chronic kidney disease, stage 3 unspecified: Secondary | ICD-10-CM

## 2020-05-26 DIAGNOSIS — J9601 Acute respiratory failure with hypoxia: Secondary | ICD-10-CM

## 2020-05-26 DIAGNOSIS — I5032 Chronic diastolic (congestive) heart failure: Secondary | ICD-10-CM

## 2020-05-26 DIAGNOSIS — J449 Chronic obstructive pulmonary disease, unspecified: Secondary | ICD-10-CM

## 2020-05-26 NOTE — Patient Outreach (Addendum)
[place] [facility]) Care Management  [place]  05/26/2020   [name] 12-17-1925 [id number]   RN Health Coach Initial Assessment   Referral Date:  01/29/2020 Referral Source:  Transfer from [place] Reason for Referral:  Continued Disease Management Education Insurance:  Medicare   Outreach Attempt:  Successful telephone outreach to patient for reintroduction and initial telephone assessment.  HIPAA verified with patient.  RN Health Coach introduced self and role.  Patient agrees to Disease Management outreaches.  Patient completed initial telephone assessment.  Social:  Patient lives home alone.  Ambulates independently and denies any falls in the past year.  Independent with ADLs and has some assistance with IADLs of cleaning.  Transports self to medical appointments.  DME in the home include:  Blood pressure cuff, scale, bilateral hearing aids, rolling walker, eyeglasses, and grab bars in the shower and at the toilet.  Conditions:  Per chart review and discussion with patient, PMH include but not limited to:  Coronary artery disease with bypass grafting, COPD, chronic renal insufficiency, hypertension, hyperlipidemia, carotid artery disease, C. Difficile, and GERD.  Patient denies any recent emergency room visits or hospitalizations.  Does report she has some ankle swelling that resolves over night with the use of her compression stockings she wears at night.  Patient denies any history of heart failure and states she has never been told to weigh daily.  Encouraged patient to wear compression hose during the day and to keep lower extremities elevated when not up moving around during the day.  Also, encouraged to let primary care provider or Cardiologist know or swelling if it continues.  Denies any shortness of breath.  Does report using her rescue inhaler daily before going upstairs to bed.  Does report the loss of her grandson to drug overdose a few  months back and feels like she is handling the situation well; denies any need for grief counseling at this time.  Was playing golf, until recently due to her balance issues.  States balance is better after receiving therapy.  Medications:  Reports taking about 8 medications daily.  Manages medications herself with weekly pill box fills.  States her Combivent inhaler cost about $200 per refill.  Discussed Pharmacy referral for possible medication assistance and patient verbally agrees.  Encounter Medications:  Outpatient Encounter Medications as of 05/26/2020  Medication Sig  . amLODipine (NORVASC) 2.5 MG tablet Take 1 tablet (2.5 mg total) by mouth daily.  Marland Kitchen atorvastatin (LIPITOR) 20 MG tablet Take 1 tablet (20 mg total) by mouth daily.  . clopidogrel (PLAVIX) 75 MG tablet Take 1 tablet (75 mg total) by mouth daily.  Marland Kitchen esomeprazole (NEXIUM) 40 MG capsule Take 40 mg by mouth daily at 12 noon.  . Ipratropium-Albuterol (COMBIVENT) 20-100 MCG/ACT AERS respimat Inhale 1 puff into the lungs every 6 (six) hours as needed for wheezing.  . isosorbide mononitrate (IMDUR) 60 MG 24 hr tablet Take 1.5 tablets (90 mg total) by mouth 2 (two) times daily.  Marland Kitchen LORazepam (ATIVAN) 0.5 MG tablet TAKE ONE AND ONE-HALF TABLET BY MOUTH ASNEEDED FOR INSOMNIA  . losartan (COZAAR) 100 MG tablet Take 1 tablet (100 mg total) by mouth daily.  . nitroGLYCERIN (NITROSTAT) 0.4 MG SL tablet PLACE 1 TABLET UNDER TONGUE EVERY 5 MIN AS NEEDED FOR CHEST PAIN IF NO RELIEF IN15 MIN CALL 911 (MAX 3 TABS)  . propranolol (INDERAL) 20 MG tablet TAKE ONE TABLET 3 TIMES DAILY AS NEEDED FOR TACHYCARDIA  .  ranolazine (RANEXA) 500 MG 12 hr tablet Take 1 tablet (500 mg total) by mouth 2 (two) times daily.   No facility-administered encounter medications on file as of 05/26/2020.    Functional Status:  In your present state of health, do you have any difficulty performing the following activities: 03/25/2020 07/16/2019  Hearing? Y -  Comment  Hearing aid -  Vision? N -  Difficulty concentrating or making decisions? N -  Walking or climbing stairs? Y -  Comment Unsteady gait; currently doing balance exercises with PT -  Dressing or bathing? N -  Doing errands, shopping? N -  Preparing Food and eating ? N -  Using the Toilet? N N  In the past six months, have you accidently leaked urine? Y N  Comment Managed with daily liner -  Do you have problems with loss of bowel control? N N  Managing your Medications? N -  Managing your Finances? N -  Housekeeping or managing your Housekeeping? Y -  Comment Maid assist -  Some recent data might be hidden    Fall/Depression Screening: Fall Risk  05/26/2020 04/08/2020 03/25/2020  Falls in the past year? 0 0 0  Comment - - -  Number falls in past yr: 0 - 0  Injury with Fall? - - -  Comment - - -  Risk for fall due to : - - -  Risk for fall due to: Comment - - -  Follow up Falls evaluation completed;Education provided;Falls prevention discussed Falls evaluation completed Falls evaluation completed   PHQ 2/9 Scores 05/26/2020 03/25/2020 03/09/2020 07/05/2019 06/21/2019 06/17/2019 03/25/2019  PHQ - 2 Score 0 0 0 0 0 0 0  PHQ- 9 Score - - - - - - -   Goals Addressed              This Visit's Progress   .  [facility]) Patient will report no emergency room visits within the next 90 days. (pt-stated)        CARE PLAN ENTRY (see longtitudinal plan of care for additional care plan information)  Current Barriers:  Marland Kitchen Knowledge deficits related to basic understanding of COPD disease process . Knowledge deficits related to basic COPD self care/management . Knowledge deficit related to basic understanding of how to use inhalers and how inhaled medications work . Cannot afford prescribed medications   Case Manager Clinical Goal(s):  Over the next 90 days patient will report using inhalers as prescribed including rinsing mouth after use  Over the next 90 days, patient will be able to verbalize  understanding of COPD action plan and when to seek appropriate levels of medical care  Over the next 90 days, patient will verbalize basic understanding of COPD disease process and self care activities  Over the next 90 days, patient will not be hospitalized for COPD exacerbation  Patient will verbalize decrease in ankle edema within the next 30 days   Interventions:   Provided patient with basic written and verbal COPD education on self care/management/and exacerbation prevention   Provided patient with COPD action plan and reinforced importance of daily self assessment  Provided written and verbal instructions on pursed lip breathing and utilized returned demonstration as teach back  Provided instruction about proper use of medications used for management of COPD including inhalers  Provided education about and advised patient to utilize infection prevention strategies to reduce risk of respiratory infection   Sending Living Better with Heart Failure Educational Packet  Encouraged to contact provider if  continued ankle swelling  Encouraged to elevate lower extremities during the day and to wear support hose during day also  [place] referral for medication assistance  Patient Self Care Activities:  . Takes medications as prescribed including inhalers . Self assesses COPD action plan zone and makes appointment with provider if in the yellow zone for 48 hours without improvement. . Utilizes infection prevention strategies to reduce risk of respiratory infection  . Utilize support hose throughout the day to help with ankle edema . Contact primary care or cardiology if swelling in ankles do not imporve  Initial goal documentation       Appointments:  Attended appointment with primary care provider, Dr. Derrel Nip on 04/08/2020 and Cardiologist, Dr. Rockey Situ on 04/07/2020..  Advanced Directives: Reports she has Living Will in place and does not wish to make any changes  at this time.   Consent:  [facility] services reviewed and discussed.  Patient verbally agrees to Disease Management outreaches and Pharmacy referral for possible medication assistance.  Plan: RN Health Coach will send primary MD barriers letter. RN Health Coach will route initial telephone assessment note to primary MD. [place] will send patient [place]. RN Health Coach will send patient Living Well with COPD Educational Packet. RN Health Coach will send patient Living Better with Heart Failure Educational Packet. RN Health Coach will send [place] referral/message for possible medication assistance (sent to [facility] Pharmacist) [place] will make next telephone outreach to patient in the month of October and patient agrees to future outreach.  [place] [phone] [name]

## 2020-05-26 NOTE — Telephone Encounter (Signed)
-----   Message from Leona Singleton, RN sent at 05/26/2020  3:44 PM EDT ----- Regarding: Medication Assistance Hi Catie,  I just spoke with Ms. Weesner to do an initial for the Disease Management program.  She uses Combivent rescue inhaler and says it cost her about $200 per refill.  Although it is ordered prn she uses 1-2 times daily.  Can you assist her with applying for medication assistance.  There was also another medication for reflux that cost > $200 that she could not afford but does not remember the name of it and says she just stayed with taking Nexium.  Thanks,  Hubert Azure RN [place] [phone] [email or web address]

## 2020-05-26 NOTE — Telephone Encounter (Signed)
Placing CCM referral for medication assistance. Checking co-sign required. Sending to PCP Dr. Derrel Nip

## 2020-05-28 ENCOUNTER — Telehealth: Payer: Self-pay

## 2020-05-28 NOTE — Chronic Care Management (AMB) (Signed)
  Chronic Care Management   Note  05/28/2020 Name: [name] MRN: [id number] DOB: July 08, 1926  [name] is a 86 y.o. year old female who is a primary care patient of Derrel Nip, Aris Everts, MD. I reached out to [name] by phone today in response to a referral sent by Ms. [name] Asmar's PCP, Dr. Derrel Nip     [name] was given information about Chronic Care Management services today including:  1. CCM service includes personalized support from designated clinical staff supervised by her physician, including individualized plan of care and coordination with other care providers 2. 24/7 contact phone numbers for assistance for urgent and routine care needs. 3. Service will only be billed when office clinical staff spend 20 minutes or more in a month to coordinate care. 4. Only one practitioner may furnish and bill the service in a calendar month. 5. The patient may stop CCM services at any time (effective at the end of the month) by phone call to the office staff. 6. The patient will be responsible for cost sharing (co-pay) of up to 20% of the service fee (after annual deductible is met).  Patient agreed to services and verbal consent obtained.   Follow up plan: Telephone appointment with care management team member scheduled for:06/19/2020  Noreene Larsson, [place], [place], [place] [address] Direct Dial: [phone] Matsuko [email or web address] Website: [email or web address]

## 2020-05-29 ENCOUNTER — Ambulatory Visit: Payer: Self-pay | Admitting: *Deleted

## 2020-06-02 ENCOUNTER — Telehealth: Payer: Self-pay | Admitting: Cardiovascular Disease

## 2020-06-02 NOTE — Telephone Encounter (Signed)
[facility] calling  Needs a medication clarification for ranolazine (RANEXA) 500 MG  Patient states the provider told her to take it a different way then what is prescribed  Please call to discuss

## 2020-06-02 NOTE — Telephone Encounter (Signed)
Returned call to pharmacy to clarify that we have Ranexa written as 1 tablet 500 BID.   Tech verified understanding and has no further questions at this time.

## 2020-06-11 ENCOUNTER — Other Ambulatory Visit: Payer: Self-pay

## 2020-06-11 ENCOUNTER — Encounter: Payer: Self-pay | Admitting: Internal Medicine

## 2020-06-11 ENCOUNTER — Ambulatory Visit (INDEPENDENT_AMBULATORY_CARE_PROVIDER_SITE_OTHER): Payer: Medicare Other | Admitting: Internal Medicine

## 2020-06-11 VITALS — BP 130/52 | HR 96 | Temp 98.2°F | Ht 62.99 in | Wt 126.8 lb

## 2020-06-11 DIAGNOSIS — F4321 Adjustment disorder with depressed mood: Secondary | ICD-10-CM | POA: Diagnosis not present

## 2020-06-11 DIAGNOSIS — N2581 Secondary hyperparathyroidism of renal origin: Secondary | ICD-10-CM | POA: Diagnosis not present

## 2020-06-11 DIAGNOSIS — I200 Unstable angina: Secondary | ICD-10-CM

## 2020-06-11 DIAGNOSIS — M7989 Other specified soft tissue disorders: Secondary | ICD-10-CM

## 2020-06-11 DIAGNOSIS — N183 Chronic kidney disease, stage 3 unspecified: Secondary | ICD-10-CM

## 2020-06-11 DIAGNOSIS — Z23 Encounter for immunization: Secondary | ICD-10-CM | POA: Diagnosis not present

## 2020-06-11 DIAGNOSIS — E782 Mixed hyperlipidemia: Secondary | ICD-10-CM | POA: Diagnosis not present

## 2020-06-11 DIAGNOSIS — I10 Essential (primary) hypertension: Secondary | ICD-10-CM | POA: Diagnosis not present

## 2020-06-11 LAB — COMPREHENSIVE METABOLIC PANEL
ALT: 11 U/L (ref 0–35)
AST: 13 U/L (ref 0–37)
Albumin: 4.3 g/dL (ref 3.5–5.2)
Alkaline Phosphatase: 56 U/L (ref 39–117)
BUN: 21 mg/dL (ref 6–23)
CO2: 28 mEq/L (ref 19–32)
Calcium: 8.9 mg/dL (ref 8.4–10.5)
Chloride: 103 mEq/L (ref 96–112)
Creatinine, Ser: 1.53 mg/dL — ABNORMAL HIGH (ref 0.40–1.20)
GFR: 31.76 mL/min — ABNORMAL LOW (ref >60.00)
Glucose, Bld: 92 mg/dL (ref 70–99)
Potassium: 4.2 mEq/L (ref 3.5–5.1)
Sodium: 139 mEq/L (ref 135–145)
Total Bilirubin: 0.7 mg/dL (ref 0.2–1.2)
Total Protein: 6.4 g/dL (ref 6.0–8.3)

## 2020-06-11 LAB — LIPID PANEL
Cholesterol: 188 mg/dL (ref 0–200)
HDL: 56.6 mg/dL (ref >39.00)
LDL Cholesterol: 103 mg/dL — ABNORMAL HIGH (ref 0–99)
NonHDL: 130.99
Total CHOL/HDL Ratio: 3
Triglycerides: 138 mg/dL (ref 0.0–149.0)
VLDL: 27.6 mg/dL (ref 0.0–40.0)

## 2020-06-11 NOTE — Progress Notes (Signed)
Subjective:  Patient ID: [name], female    DOB: April 17, 1926  Age: 86 y.o. MRN: [id number]  CC: The primary encounter diagnosis was Stage 3 chronic kidney disease, unspecified whether stage 3a or 3b CKD. Diagnoses of Hyperparathyroidism due to renal insufficiency ([place]), Mixed hyperlipidemia, Need for immunization against influenza, Essential hypertension, Leg swelling, and Grief were also pertinent to this visit.  HPI [name] presents for follow up on multiple issues, including hyperlipidemia, CKD. And hypertension   This visit occurred during the SARS-CoV-2 public health emergency.  Safety protocols were in place, including screening questions prior to the visit, additional usage of staff PPE, and extensive cleaning of exam room while observing appropriate contact time as indicated for disinfecting solutions.   Cc:  Feet have been  swollen for the past month but are not swollen today.  Denies leg pain, weight gain, orthopnea and chest pain.  She has mild dyspnea with exertion but not over baseline dsypnea due to COPD   Medications reviewed.  Not taking a diuretic. Confused about medications,  Not sure what medication she has been taking for GERD. All questions answered.   Feels generally well.  Patient is taking her medications as prescribed and notes no adverse effects.  Home BP readings have been done about once per week and are  generally < 130/80 .  She is avoiding added salt in her diet and walking regularly about 3 times per week for exercise  .  Outpatient Medications Prior to Visit  Medication Sig Dispense Refill  . amLODipine (NORVASC) 2.5 MG tablet Take 1 tablet (2.5 mg total) by mouth daily. 90 tablet 3  . clopidogrel (PLAVIX) 75 MG tablet Take 1 tablet (75 mg total) by mouth daily. 90 tablet 3  . esomeprazole (NEXIUM) 40 MG capsule Take 40 mg by mouth daily at 12 noon.    . Ipratropium-Albuterol (COMBIVENT) 20-100 MCG/ACT AERS respimat Inhale 1 puff into the  lungs every 6 (six) hours as needed for wheezing. 4 g 6  . isosorbide mononitrate (IMDUR) 60 MG 24 hr tablet Take 1.5 tablets (90 mg total) by mouth 2 (two) times daily. 270 tablet 3  . LORazepam (ATIVAN) 0.5 MG tablet TAKE ONE AND ONE-HALF TABLET BY MOUTH ASNEEDED FOR INSOMNIA 45 tablet 5  . losartan (COZAAR) 100 MG tablet Take 1 tablet (100 mg total) by mouth daily. 90 tablet 3  . nitroGLYCERIN (NITROSTAT) 0.4 MG SL tablet PLACE 1 TABLET UNDER TONGUE EVERY 5 MIN AS NEEDED FOR CHEST PAIN IF NO RELIEF IN15 MIN CALL 911 (MAX 3 TABS) 25 tablet 3  . propranolol (INDERAL) 20 MG tablet TAKE ONE TABLET 3 TIMES DAILY AS NEEDED FOR TACHYCARDIA 270 tablet 3  . ranolazine (RANEXA) 500 MG 12 hr tablet Take 1 tablet (500 mg total) by mouth 2 (two) times daily. 180 tablet 3  . atorvastatin (LIPITOR) 20 MG tablet Take 1 tablet (20 mg total) by mouth daily. 90 tablet 3   No facility-administered medications prior to visit.    Review of Systems;  Patient denies headache, fevers, malaise, unintentional weight loss, skin rash, eye pain, sinus congestion and sinus pain, sore throat, dysphagia,  hemoptysis , cough, dyspnea, wheezing, chest pain, palpitations, orthopnea, edema, abdominal pain, nausea, melena, diarrhea, constipation, flank pain, dysuria, hematuria, urinary  Frequency, nocturia, numbness, tingling, seizures,  Focal weakness, Loss of consciousness,  Tremor, insomnia, depression, anxiety, and suicidal ideation.      Objective:  BP (!) 130/52 (BP Location: Left Arm, Patient  Position: Sitting)   Pulse 96   Temp 98.2 F (36.8 C)   Ht 5' 2.99" (1.6 m)   Wt 126 lb 12.8 oz (57.5 kg)   SpO2 96%   BMI 22.47 kg/m   BP Readings from Last 3 Encounters:  06/11/20 (!) 130/52  04/08/20 126/60  04/07/20 140/60    Wt Readings from Last 3 Encounters:  06/11/20 126 lb 12.8 oz (57.5 kg)  04/08/20 127 lb 3.2 oz (57.7 kg)  04/07/20 127 lb 6 oz (57.8 kg)    General appearance: alert, cooperative and  appears stated age Ears: normal TM's and external ear canals both ears Throat: lips, mucosa, and tongue normal; teeth and gums normal Neck: no adenopathy, no carotid bruit, supple, symmetrical, trachea midline and thyroid not enlarged, symmetric, no tenderness/mass/nodules Back: symmetric, no curvature. ROM normal. No CVA tenderness. Lungs: clear to auscultation bilaterally Heart: regular rate and rhythm, S1, S2 normal, no murmur, click, rub or gallop Abdomen: soft, non-tender; bowel sounds normal; no masses,  no organomegaly Pulses: 2+ and symmetric Skin: Skin color, texture, turgor normal. No rashes or lesions Lymph nodes: Cervical, supraclavicular, and axillary nodes normal.  Lab Results  Component Value Date   HGBA1C 5.8 01/30/2017   HGBA1C 5.8 09/26/2016    Lab Results  Component Value Date   CREATININE 1.53 (H) 06/11/2020   CREATININE 1.26 (H) 01/08/2020   CREATININE 1.48 (H) 12/30/2019    Lab Results  Component Value Date   WBC 9.9 12/30/2019   HGB 13.2 12/30/2019   HCT 40.8 12/30/2019   PLT 226 12/30/2019   GLUCOSE 92 06/11/2020   CHOL 188 06/11/2020   TRIG 138.0 06/11/2020   HDL 56.60 06/11/2020   LDLDIRECT 72.0 11/09/2015   LDLCALC 103 (H) 06/11/2020   ALT 11 06/11/2020   AST 13 06/11/2020   NA 139 06/11/2020   K 4.2 06/11/2020   CL 103 06/11/2020   CREATININE 1.53 (H) 06/11/2020   BUN 21 06/11/2020   CO2 28 06/11/2020   TSH 1.64 01/08/2020   INR 0.9 01/31/2018   HGBA1C 5.8 01/30/2017   MICROALBUR 2.5 (H) 12/26/2011    MM 3D SCREEN BREAST BILATERAL  Result Date: 04/16/2020 CLINICAL DATA:  Screening. EXAM: DIGITAL SCREENING BILATERAL MAMMOGRAM WITH TOMO AND CAD COMPARISON:  Previous exam(s). ACR Breast Density Category c: The breast tissue is heterogeneously dense, which may obscure small masses. FINDINGS: There are no findings suspicious for malignancy. Images were processed with CAD. IMPRESSION: No mammographic evidence of malignancy. A result letter of  this screening mammogram will be mailed directly to the patient. RECOMMENDATION: Screening mammogram in one year. (Code:[id number]-B-01Y) BI-RADS CATEGORY  1: Negative. Electronically Signed   By: Lajean Manes M.D.   On: 04/16/2020 13:51    Assessment & Plan:   Problem List Items Addressed This Visit      Unprioritized   Hyperlipidemia    LDL not at goal on 20 dose  Increase to 40 mg atorvastatin .       Relevant Medications   atorvastatin (LIPITOR) 40 MG tablet   Other Relevant Orders   Lipid panel (Completed)   Hypertension    Well controlled on current regimen of losartan, Imdur and amlodipine . Renal function stable, no changes today.        Relevant Medications   atorvastatin (LIPITOR) 40 MG tablet   Chronic kidney disease, stage III (moderate) - Primary    Diagnosis reviewed with patient Renal function is at baseline with avoidance of NSAIDs.  She is on an ARB for control of hypertension,, and statin for control of hyperlipidemia.   Lab Results  Component Value Date   CREATININE 1.53 (H) 06/11/2020   Lab Results  Component Value Date   NA 139 06/11/2020   K 4.2 06/11/2020   CL 103 06/11/2020   CO2 28 06/11/2020         Relevant Orders   Comprehensive metabolic panel (Completed)   Leg swelling    None on today's exam,  But likely medication induced given use of amlodipine, Imdur and Ranexa. Recommended to continue to use compression stockings and leg elevation.  Avoiding diuretics given GFR of 31       Grief    Her grief has improved somewhat  with time, but she remains with a sad affect        Other Visit Diagnoses    Hyperparathyroidism due to renal insufficiency ([place])       Relevant Orders   PTH, Intact and Calcium (Completed)   Need for immunization against influenza       Relevant Orders   Flu Vaccine QUAD High Dose(Fluad) (Completed)      I have changed [name]'s atorvastatin. I am also having her maintain her Ipratropium-Albuterol,  LORazepam, esomeprazole, amLODipine, clopidogrel, isosorbide mononitrate, losartan, nitroGLYCERIN, propranolol, and ranolazine.  Meds ordered this encounter  Medications  . atorvastatin (LIPITOR) 40 MG tablet    Sig: Take 1 tablet (40 mg total) by mouth daily.    Dispense:  90 tablet    Refill:  1    PHARMACIST NOTE DOSE CHANGE AND KEEP ON FILE FOR NEXT REFILL    Medications Discontinued During This Encounter  Medication Reason  . atorvastatin (LIPITOR) 20 MG tablet     Follow-up: Return in about 6 months (around 12/09/2020).   [name], MD

## 2020-06-11 NOTE — Patient Instructions (Addendum)
All you need is a stronger nexium dose  ,  40 mg daily  You can take your 20 mg twice  Daily  In the morning

## 2020-06-12 LAB — PTH, INTACT AND CALCIUM
Calcium: 9 mg/dL (ref 8.6–10.4)
PTH: 36 pg/mL (ref 14–64)

## 2020-06-13 MED ORDER — ATORVASTATIN CALCIUM 40 MG PO TABS
40.0000 mg | ORAL_TABLET | Freq: Every day | ORAL | 1 refills | Status: DC
Start: 2020-06-13 — End: 2020-11-09

## 2020-06-13 NOTE — Assessment & Plan Note (Signed)
Her grief has improved somewhat  with time, but she remains with a sad affect

## 2020-06-13 NOTE — Assessment & Plan Note (Signed)
LDL not at goal on 20 dose  Increase to 40 mg atorvastatin .

## 2020-06-13 NOTE — Assessment & Plan Note (Signed)
Diagnosis reviewed with patient Renal function is at baseline with avoidance of NSAIDs.  She is on an ARB for control of hypertension,, and statin for control of hyperlipidemia.   Lab Results  Component Value Date   CREATININE 1.53 (H) 06/11/2020   Lab Results  Component Value Date   NA 139 06/11/2020   K 4.2 06/11/2020   CL 103 06/11/2020   CO2 28 06/11/2020

## 2020-06-13 NOTE — Progress Notes (Signed)
I hope you are enjoying this beautiful fall weather!  I wanted to let you know that your cholesterol is not at goal , so I am recommending an increase in your dose of atorvastatin.  You can take 2 of your current tablets daily until you need a refill, which will be sent in at the higher dose  The rest of your labs are normal except for the kidney function, which is slightly lower than previously noted.  Please try to increase your water  intake (or other non caffeinated beverages) to a minimum 60 ounces daily , and avoid using all  anti inflammatories such as advil, aleve (ibuprofen and naproxen) as they can cause deterioration in renal function.  Tylenol is not an anti inflammatory ,  So you can use it safely,  Up to 2000 mg daily in divided doses,  For management of pain.    I'll see you in  6 months,  sooner if needed

## 2020-06-13 NOTE — Assessment & Plan Note (Signed)
None on today's exam,  But likely medication induced given use of amlodipine, Imdur and Ranexa. Recommended to continue to use compression stockings and leg elevation.  Avoiding diuretics given GFR of 31

## 2020-06-13 NOTE — Assessment & Plan Note (Signed)
Well controlled on current regimen of losartan, Imdur and amlodipine . Renal function stable, no changes today.

## 2020-06-19 ENCOUNTER — Ambulatory Visit (INDEPENDENT_AMBULATORY_CARE_PROVIDER_SITE_OTHER): Payer: Medicare Other | Admitting: Pharmacist

## 2020-06-19 DIAGNOSIS — I25118 Atherosclerotic heart disease of native coronary artery with other forms of angina pectoris: Secondary | ICD-10-CM

## 2020-06-19 DIAGNOSIS — J449 Chronic obstructive pulmonary disease, unspecified: Secondary | ICD-10-CM | POA: Diagnosis not present

## 2020-06-19 DIAGNOSIS — E782 Mixed hyperlipidemia: Secondary | ICD-10-CM | POA: Diagnosis not present

## 2020-06-19 DIAGNOSIS — N183 Chronic kidney disease, stage 3 unspecified: Secondary | ICD-10-CM

## 2020-06-19 MED ORDER — ANORO ELLIPTA 62.5-25 MCG/INH IN AEPB: 1.0000 | INHALATION_SPRAY | Freq: Every day | RESPIRATORY_TRACT | 2 refills | Status: DC

## 2020-06-19 NOTE — Chronic Care Management (AMB) (Signed)
Chronic Care Management   Note  06/19/2020 Name: [name] MRN: [id number] DOB: 1926-08-03   Subjective:  [name] is a 86 y.o. year old female who is a primary care patient of Tullo, Aris Everts, MD. The CCM team was consulted for assistance with chronic disease management and care coordination needs.     Contacted patient for initial medication access and medication management support  [name] was given information about Chronic Care Management services today including:  1. CCM service includes personalized support from designated clinical staff supervised by her physician, including individualized plan of care and coordination with other care providers 2. 24/7 contact phone numbers for assistance for urgent and routine care needs. 3. Service will only be billed when office clinical staff spend 20 minutes or more in a month to coordinate care. 4. Only one practitioner may furnish and bill the service in a calendar month. 5. The patient may stop CCM services at any time (effective at the end of the month) by phone call to the office staff. 6. The patient will be responsible for cost sharing (co-pay) of up to 20% of the service fee (after annual deductible is met).  Patient agreed to services and verbal consent obtained.   Review of patient status, including review of consultants reports, laboratory and other test data, was performed as part of comprehensive evaluation and provision of chronic care management services.   SDOH (Social Determinants of Health) assessments and interventions performed:  SDOH Interventions     Most Recent Value  SDOH Interventions  Financial Strain Interventions Other (Comment)  [manufacturer assistance evaluation]  Social Connections Interventions Other (Comment)  [encouraged continued hobbies now that she cannot play golf]       Objective:  Lab Results  Component Value Date   CREATININE 1.53 (H) 06/11/2020   CREATININE 1.26 (H)  01/08/2020   CREATININE 1.48 (H) 12/30/2019    Lab Results  Component Value Date   HGBA1C 5.8 01/30/2017       Component Value Date/Time   CHOL 188 06/11/2020 1114   CHOL 143 06/18/2014 0419   TRIG 138.0 06/11/2020 1114   TRIG 126 06/18/2014 0419   HDL 56.60 06/11/2020 1114   HDL 46 06/18/2014 0419   CHOLHDL 3 06/11/2020 1114   VLDL 27.6 06/11/2020 1114   VLDL 25 06/18/2014 0419   LDLCALC 103 (H) 06/11/2020 1114   LDLCALC 72 06/18/2014 0419   LDLDIRECT 72.0 11/09/2015 1209    Clinical ASCVD: Yes - hx revascularization The ASCVD Risk score Mikey Bussing DC Jr., et al., 2013) failed to calculate for the following reasons:   The 2013 ASCVD risk score is only valid for ages 29 to 54    BP Readings from Last 3 Encounters:  06/11/20 (!) 130/52  04/08/20 126/60  04/07/20 140/60    Allergies  Allergen Reactions  . Codeine Nausea And Vomiting    Pt would like this removed. Not allergic to codeine.     Medications Reviewed Today    Reviewed by De Hollingshead, RPH-CPP (Pharmacist) on 06/19/20 at [place] List Status: <None>  Medication Order Taking? Sig Documenting Provider Last Dose Status Informant  amLODipine (NORVASC) 2.5 MG tablet [id number] Yes Take 1 tablet (2.5 mg total) by mouth daily. Minna Merritts, MD Taking Active   atorvastatin (LIPITOR) 40 MG tablet [id number] Yes Take 1 tablet (40 mg total) by mouth daily. [name], MD Taking Active   clopidogrel (PLAVIX) 75 MG tablet [id number] Yes  Take 1 tablet (75 mg total) by mouth daily. Minna Merritts, MD Taking Active   esomeprazole (NEXIUM) 40 MG capsule [id number] Yes Take 40 mg by mouth daily at 12 noon. [provider] Taking Active   Ipratropium-Albuterol (COMBIVENT) 20-100 MCG/ACT AERS respimat [id number]  Inhale 1 puff into the lungs every 6 (six) hours as needed for wheezing. Minna Merritts, MD  Active   isosorbide mononitrate (IMDUR) 60 MG 24 hr tablet [id number] Yes Take 1.5 tablets (90 mg total)  by mouth 2 (two) times daily. Minna Merritts, MD Taking Active   LORazepam (ATIVAN) 0.5 MG tablet [id number] Yes TAKE ONE AND ONE-HALF TABLET BY MOUTH ASNEEDED FOR INSOMNIA [name], MD Taking Active   losartan (COZAAR) 100 MG tablet [id number] Yes Take 1 tablet (100 mg total) by mouth daily. Minna Merritts, MD Taking Active   nitroGLYCERIN (NITROSTAT) 0.4 MG SL tablet [id number] No PLACE 1 TABLET UNDER TONGUE EVERY 5 MIN AS NEEDED FOR CHEST PAIN IF NO RELIEF IN15 MIN CALL 911 (MAX 3 TABS)  Patient not taking: Reported on 06/19/2020   Minna Merritts, MD Not Taking Active   propranolol (INDERAL) 20 MG tablet [id number] No TAKE ONE TABLET 3 TIMES DAILY AS NEEDED FOR TACHYCARDIA  Patient not taking: Reported on 06/19/2020   Minna Merritts, MD Not Taking Active   ranolazine (RANEXA) 500 MG 12 hr tablet [id number] Yes Take 1 tablet (500 mg total) by mouth 2 (two) times daily. Minna Merritts, MD Taking Active            Assessment:   Goals Addressed              This Visit's Progress     Patient Stated   .  PharmD "I want to make sure I am taking only necessary medications" (pt-stated)        CARE PLAN ENTRY (see longitudinal plan of care for additional care plan information)  Current Barriers:  . Social, financial, community barriers:  o Notes she is sad she has had to stop golfing d/t unsteadiness. Has other hobbies including working in her gardening.  o Noted to [place] CM that her Combivent copay was >$200. Reports today that while she can pay this cost, she finds it "ridiculous" that it is this expensive . Polypharmacy; complex patient with multiple comorbidities including COPD, CAD, HTN, angina, CKD . EGFR 31 mL/min . Self-manages medications by using a BID pill box . COPD: Grade D (mMRC today 3, 2 exacerbations in the past year, 1 requiring ED visit);  Combivent 20/100 mcg PRN - using 3-4 times weekly for SOB. Using before going out, before going to bed. Notes  that sometimes she can't tell if her "angina" is related to vascular or lungs . CAD- HTN: Amlodipine 2.5 QAM, losartan 100 mg QAM;  o Periodic home BP readings 141/69, 144/77, 117/57, 150/75, 133/70, 133/62, 125/61 o Denies dizziness, lightheadedness of hypotension symptoms . CAD- Antianginal: ranolazine 500 mg BID, isosorbide 90 mg BID, NTG PRN - reports she has not required NTG "in a while"  . CAD - ASCVD risk reduction: atorvastatin 40 mg daily (recently increased d/t LDL not at goal <70); clopidogrel 75 mg daily . GERD: esomeprazole 40 mg daily  . Insomnia: lorazepam 0.75 mg QPM, reports she needs every night  Pharmacist Clinical Goal(s):  Marland Kitchen Over the next 90 days, patient will work with PharmD and provider towards optimized medication management  Interventions: . Comprehensive medication review performed;  medication list updated in electronic medical record . Inter-disciplinary care team collaboration (see longitudinal plan of care) . Given patient's reports of not being able to differential lung/breathing symptoms vs cardiac symptoms, may be appropriate to discuss optimization of COPD regimen. Rather than PRN ipratropium/albuterol, recommend escalation to daily LABA/LAMA maintenance inhaler regimen.  . Patient is over income for assistance w/ inhalers. Reviewed formulary - Combivent appears to be Tier 4 non-preferred brand. Anoro is a Tier 3 preferred brand. Clinically may be more beneficial and may be more cost effective. Start Anoro 62.5/25 mcg daily. Patient may use Combivent supply for rescue inhaler. Moving forward, will switch to PRN albuterol HFA if needed for breakthrough symptoms, as this is Tier 3 preferred brand.  . Moving forward, will discuss risks of benzodiazepines. . Reiterated plan to continue 2 atorvastatin 20 mg tabs until she completes home supply, then switch to new script for 1 atorvastatin 40 mg tab  Patient Self Care Activities:  . Patient will take medications as  prescribed using pill box . Patient will notify provider with any questions or concerns  Initial goal documentation        Plan: - Scheduled f/u call in ~4 weeks  Catie Darnelle Maffucci, PharmD, [place], [place] Pharmacist [place] [phone]

## 2020-06-19 NOTE — Patient Instructions (Addendum)
[name],   It was GREAT talking with you today!  I reviewed your prescription formulary. It appears Combivent is NOT a preferred brand name medication, in addition to it not being the most clinically appropriate option. Let's try a different inhaler. Start Anoro 1 puff once daily. This is a preferred brand name medication on your plan. This medication has 2 long acting medications in it to help improve your breathing. Our goal would be for you to have fewer symptoms of shortness of breath or wheezing. Even if your breathing is "fine", take this medication to PREVENT symptoms.   The yearly income cut off for this program that manufacturers Anoro is $32,200. Check and see if your Social Security + your husband's retirement + your retirement falls under this. They would also require you to have spent a total of $600 on prescription copays this calendar year. Check with [facility] on that. If you haven't met the out of pocket spend requirement, but your income ([place] + your husband's retirement + your retirement) is less than $25,760, we may be able to pursue assistance for another type of inhaler that is SIMILAR to Anoro.   Continue 2 atorvastatin 20 mg tablets every day until you finish that bottle. Then, pick up the prescription for atorvastatin 40 mg tablets that Dr. Derrel Nip sent to [facility]. You will just take 1 of these every day.   Please call me with any questions or concerns!  [name], PharmD, [name], [name] [phone]  Visit Information  Goals Addressed              This Visit's Progress     Patient Stated   .  PharmD "I want to make sure I am taking only necessary medications" (pt-stated)        CARE PLAN ENTRY (see longitudinal plan of care for additional care plan information)  Current Barriers:  . Social, financial, community barriers:  o Notes she is sad she has had to stop golfing d/t unsteadiness. Has other hobbies including working in her  gardening.  o Noted to [place] CM that her Combivent copay was >$200. Reports today that while she can pay this cost, she finds it "ridiculous" that it is this expensive . Polypharmacy; complex patient with multiple comorbidities including COPD, CAD, HTN, angina, CKD . EGFR 31 mL/min . Self-manages medications by using a BID pill box . COPD: Grade D (mMRC today 3, 2 exacerbations in the past year, 1 requiring ED visit);  Combivent 20/100 mcg PRN - using 3-4 times weekly for SOB. Using before going out, before going to bed. Notes that sometimes she can't tell if her "angina" is related to vascular or lungs . CAD- HTN: Amlodipine 2.5 QAM, losartan 100 mg QAM;  o Periodic home BP readings 141/69, 144/77, 117/57, 150/75, 133/70, 133/62, 125/61 o Denies dizziness, lightheadedness of hypotension symptoms . CAD- Antianginal: ranolazine 500 mg BID, isosorbide 90 mg BID, NTG PRN - reports she has not required NTG "in a while"  . CAD - ASCVD risk reduction: atorvastatin 40 mg daily (recently increased d/t LDL not at goal <70); clopidogrel 75 mg daily . GERD: esomeprazole 40 mg daily  . Insomnia: lorazepam 0.75 mg QPM, reports she needs every night  Pharmacist Clinical Goal(s):  Marland Kitchen Over the next 90 days, patient will work with PharmD and provider towards optimized medication management  Interventions: . Comprehensive medication review performed; medication list updated in electronic medical record . Inter-disciplinary care team  collaboration (see longitudinal plan of care) . Given patient's reports of not being able to differential lung/breathing symptoms vs cardiac symptoms, may be appropriate to discuss optimization of COPD regimen. Rather than PRN ipratropium/albuterol, recommend escalation to daily LABA/LAMA maintenance inhaler regimen.  . Patient is over income for assistance w/ inhalers. Reviewed formulary - Combivent appears to be Tier 4 non-preferred brand. Anoro is a Tier 3 preferred brand.  Clinically may be more beneficial and may be more cost effective. Start Anoro 62.5/25 mcg daily. Patient may use Combivent supply for rescue inhaler. Moving forward, will switch to PRN albuterol HFA if needed for breakthrough symptoms, as this is Tier 3 preferred brand.  . Moving forward, will discuss risks of benzodiazepines. . Reiterated plan to continue 2 atorvastatin 20 mg tabs until she completes home supply, then switch to new script for 1 atorvastatin 40 mg tab  Patient Self Care Activities:  . Patient will take medications as prescribed using pill box . Patient will notify provider with any questions or concerns  Initial goal documentation        Ms. Valko was given information about Chronic Care Management services today including:  1. CCM service includes personalized support from designated clinical staff supervised by her physician, including individualized plan of care and coordination with other care providers 2. 24/7 contact phone numbers for assistance for urgent and routine care needs. 3. Service will only be billed when office clinical staff spend 20 minutes or more in a month to coordinate care. 4. Only one practitioner may furnish and bill the service in a calendar month. 5. The patient may stop CCM services at any time (effective at the end of the month) by phone call to the office staff. 6. The patient will be responsible for cost sharing (co-pay) of up to 20% of the service fee (after annual deductible is met).  Patient agreed to services and verbal consent obtained.   The patient verbalized understanding of instructions provided today and agreed to receive a mailed copy of patient instruction and/or educational materials.    Plan: - Scheduled f/u call in ~4 weeks  [name], PharmD, [place], [place] Pharmacist [place] [phone]

## 2020-06-25 NOTE — Progress Notes (Signed)
I have collaborated with the care management provider regarding care management and care coordination activities outlined in this encounter and have reviewed this encounter including documentation in the note and care plan. I am certifying that I agree with the content of this note and encounter as supervising physician.  Regards,   Reason Helzer, MD    

## 2020-07-01 ENCOUNTER — Ambulatory Visit: Payer: Self-pay | Admitting: *Deleted

## 2020-07-01 DIAGNOSIS — Z23 Encounter for immunization: Secondary | ICD-10-CM | POA: Diagnosis not present

## 2020-07-14 ENCOUNTER — Encounter: Payer: Self-pay | Admitting: *Deleted

## 2020-07-14 ENCOUNTER — Other Ambulatory Visit: Payer: Self-pay | Admitting: *Deleted

## 2020-07-14 NOTE — Patient Outreach (Signed)
[facility]) Care Management  07/14/2020  [name] 03-12-1926 [id number]   RN Health Coach Monthly Outreach  Referral Date:01/29/2020 Referral Source:Transfer from [place] Reason for Referral:Continued Disease Management Education Insurance:Medicare   Outreach Attempt:  Successful telephone outreach to patient for follow up.  HIPAA verified with patient.  Patient reporting she is doing about the same.  States she has spoken with [facility] concerning inhaler and medications were changed and update.  Reports daily compliance with new maintenance inhaler, Anoro Ellipta.  Denies any increase in shortness of breath.  Continues to report some ankle swelling but reports it is better with wearing compression hose during the daytime.  Denies any falls and reports continued walking for exercise.  Appointments:   Attended appointment with primary care provider, Dr. Derrel Nip on 06/11/2020.  Plan: RN Health Coach will resend [place]. RN Health Coach will resend Living Well with COPD Educational Packet. RN Health Coach will resend Living Better with Heart Failure Educational Packet. RN Health Coach will send Quality Sleep Information Adult. RN Health Coach will make next telephone outreach to patient in the month of December and patient agrees to future outreach.   [place] [phone] [email or web address]

## 2020-07-14 NOTE — Patient Instructions (Signed)
Goals Addressed              This Visit's Progress     [facility]) Learn More About My Health        Follow Up Date 09/17/20   - tell my story and reason for my visit - make a list of questions - ask questions - repeat what I heard to make sure I understand - bring a list of my medicines to the visit - speak up when I don't understand    Why is this important?   The best way to learn about your health and care is by talking to the doctor and nurse.  They will answer your questions and give you information in the way that you like best.    Notes:       [facility]) Make and Keep All Appointments        Follow Up Date 11/16/20   - call to cancel if needed - keep a calendar with appointment dates    Why is this important?   Part of staying healthy is seeing the doctor for follow-up care.  If you forget your appointments, there are some things you can do to stay on track.    Notes:       [facility]) Manage My Cholesterol        Follow Up Date 11/16/20   - eat smaller or less servings of red meat - fill half the plate with nonstarchy vegetables - read food labels for fat and fiber -able to verbalize increase dose of statin and compliant with taking increase dose    Why is this important?   Changing cholesterol starts with eating heart-healthy foods.  Other steps may be to increase your activity and to quit if you smoke.    Notes:       COMPLETED: [facility]) Patient will report no emergency room visits within the next 90 days. (pt-stated)        [place] (see longtitudinal plan of care for additional care plan information)  Current Barriers:   Knowledge deficits related to basic understanding of COPD disease process  Knowledge deficits related to basic COPD self care/management  Knowledge deficit related to basic understanding of how to use inhalers and how inhaled medications work  Cannot afford prescribed medications   Case Manager Clinical Goal(s):  Over the next 90  days patient will report using inhalers as prescribed including rinsing mouth after use  Over the next 90 days, patient will be able to verbalize understanding of COPD action plan and when to seek appropriate levels of medical care  Over the next 90 days, patient will verbalize basic understanding of COPD disease process and self care activities  Over the next 90 days, patient will not be hospitalized for COPD exacerbation  Patient will verbalize decrease in ankle edema within the next 30 days   Interventions:   Provided patient with basic written and verbal COPD education on self care/management/and exacerbation prevention   Provided patient with COPD action plan and reinforced importance of daily self assessment  Provided written and verbal instructions on pursed lip breathing and utilized returned demonstration as teach back  Provided instruction about proper use of medications used for management of COPD including inhalers  Provided education about and advised patient to utilize infection prevention strategies to reduce risk of respiratory infection   Sending Living Better with Heart Failure Educational Packet  Encouraged to contact provider if continued ankle swelling  Encouraged to elevate  lower extremities during the day and to wear support hose during day also  [place] referral for medication assistance  Patient Self Care Activities:   Takes medications as prescribed including inhalers  Self assesses COPD action plan zone and makes appointment with provider if in the yellow zone for 48 hours without improvement.  Utilizes infection prevention strategies to reduce risk of respiratory infection   Utilize support hose throughout the day to help with ankle edema  Contact primary care or cardiology if swelling in ankles do not imporve  Initial goal documentation   Resolving due to duplicate goals      [facility]) Track and Manage My Symptoms         Follow Up Date 11/16/20   - develop a rescue plan - eliminate symptom triggers at home - follow rescue plan if symptoms flare-up - keep follow-up appointments -review COPD education mailed with action plan and zones  -wear compression hose throughout the day to help with ankle swelling   Why is this important?   Tracking your symptoms and other information about your health helps your doctor plan your care.  Write down the symptoms, the time of day, what you were doing and what medicine you are taking.  You will soon learn how to manage your symptoms.     Notes:

## 2020-07-21 ENCOUNTER — Ambulatory Visit: Payer: Medicare Other | Admitting: Pharmacist

## 2020-07-21 DIAGNOSIS — E782 Mixed hyperlipidemia: Secondary | ICD-10-CM

## 2020-07-21 DIAGNOSIS — N183 Chronic kidney disease, stage 3 unspecified: Secondary | ICD-10-CM

## 2020-07-21 DIAGNOSIS — J449 Chronic obstructive pulmonary disease, unspecified: Secondary | ICD-10-CM

## 2020-07-21 DIAGNOSIS — I25118 Atherosclerotic heart disease of native coronary artery with other forms of angina pectoris: Secondary | ICD-10-CM

## 2020-07-21 NOTE — Chronic Care Management (AMB) (Signed)
Chronic Care Management   Follow Up Note   07/21/2020 Name: SEANNE CHIRICO MRN: [id number] DOB: Aug 22, 1926  Referred by: [name], MD Reason for referral : Chronic Care Management (Medication Management)   CALA KRUCKENBERG is a 86 y.o. year old female who is a primary care patient of Tullo, Aris Everts, MD. The CCM team was consulted for assistance with chronic disease management and care coordination needs.    Contacted patient for medication management f/u.  Review of patient status, including review of consultants reports, relevant laboratory and other test results, and collaboration with appropriate care team members and the patient's provider was performed as part of comprehensive patient evaluation and provision of chronic care management services.    SDOH (Social Determinants of Health) assessments performed: Yes See Care Plan activities for detailed interventions related to SDOH)  SDOH Interventions     Most Recent Value  SDOH Interventions  Social Connections Interventions Intervention Not Indicated       Outpatient Encounter Medications as of 07/21/2020  Medication Sig   amLODipine (NORVASC) 2.5 MG tablet Take 1 tablet (2.5 mg total) by mouth daily.   atorvastatin (LIPITOR) 40 MG tablet Take 1 tablet (40 mg total) by mouth daily.   Calcium Carbonate Antacid (CALCIUM CARBONATE PO) Take 1 capsule by mouth daily.   clopidogrel (PLAVIX) 75 MG tablet Take 1 tablet (75 mg total) by mouth daily.   esomeprazole (NEXIUM) 40 MG capsule Take 40 mg by mouth daily at 12 noon.   Ipratropium-Albuterol (COMBIVENT) 20-100 MCG/ACT AERS respimat Inhale 1 puff into the lungs every 6 (six) hours as needed for wheezing.   isosorbide mononitrate (IMDUR) 60 MG 24 hr tablet Take 1.5 tablets (90 mg total) by mouth 2 (two) times daily.   LORazepam (ATIVAN) 0.5 MG tablet TAKE ONE AND ONE-HALF TABLET BY MOUTH ASNEEDED FOR INSOMNIA   losartan (COZAAR) 100 MG tablet Take 1 tablet (100  mg total) by mouth daily.   Probiotic Product (PROBIOTIC PO) Take 1 capsule by mouth daily.   propranolol (INDERAL) 20 MG tablet TAKE ONE TABLET 3 TIMES DAILY AS NEEDED FOR TACHYCARDIA   ranolazine (RANEXA) 500 MG 12 hr tablet Take 1 tablet (500 mg total) by mouth 2 (two) times daily.   umeclidinium-vilanterol (ANORO ELLIPTA) 62.5-25 MCG/INH AEPB Inhale 1 puff into the lungs daily.   nitroGLYCERIN (NITROSTAT) 0.4 MG SL tablet PLACE 1 TABLET UNDER TONGUE EVERY 5 MIN AS NEEDED FOR CHEST PAIN IF NO RELIEF IN15 MIN CALL 911 (MAX 3 TABS) (Patient not taking: Reported on 07/21/2020)   No facility-administered encounter medications on file as of 07/21/2020.     Objective:   Goals Addressed              This Visit's Progress     Patient Stated     PharmD "I want to make sure I am taking only necessary medications" (pt-stated)        CARE PLAN ENTRY (see longitudinal plan of care for additional care plan information)  Current Barriers:   Social, financial, community barriers:  o Sad that she can't golf anymore due to mobility, but instead plays bridge, works crossword puzzles, plays games on her telephone  Polypharmacy; complex patient with multiple comorbidities including COPD, CAD, HTN, angina, CKD  eGFR 31 mL/min  [facility] doing adherence packaging for patient  COPD: Anoro 62.5/25 mcg daily, using QPM. Reports she has not needed Combivent but ~once weekly since being on Anoro. Grade D  CAD- HTN: Amlodipine 2.5 QAM, losartan 100 mg QAM;  o Has not been checking BP at home recently, notes she needs to get back in the habit of doing this  CAD- Antianginal: ranolazine 500 mg BID, isosorbide 90 mg BID, NTG PRN, though has not needed in a while  CAD - ASCVD risk reduction: atorvastatin 40 mg daily (taking 2 20 mg tabs); clopidogrel 75 mg daily  GERD: esomeprazole 40 mg daily   Insomnia: lorazepam 0.75 mg QPM, reports she needs every night, but still doesn't feel  like she is sleeping as well as she would like to be. Denies caffeine afternoon, except the occasional chocolate dessert. Denies naps and technology before bed.   Pharmacist Clinical Goal(s):   Over the next 90 days, patient will work with PharmD and provider towards optimized medication management  Interventions:  Comprehensive medication review performed; medication list updated in electronic medical record  Inter-disciplinary care team collaboration (see longitudinal plan of care)  Continue Anoro daily w/ Combivent PRN. Can switch to albuterol PRN once Combivent supply runs out, if cheaper.   Encouraged to restart checking BP periodically and documenting.   Encouraged continue adherence to atorvastatin 40 mg daily.   Patient Self Care Activities:   Patient will take medications as prescribed using pill box  Patient will notify provider with any questions or concerns  Please see past updates related to this goal by clicking on the "Past Updates" button in the selected goal          Plan:  - Scheduled f/u call in ~ 10 weeks  Catie Darnelle Maffucci, PharmD, [place], [place] Pharmacist [place] [phone]

## 2020-07-21 NOTE — Patient Instructions (Signed)
Ms. Torosian,   It was great talking to you today (when we weren't having phone troubles!)  Keep taking the Anoro every day. The goal of this medication is to reduce how often you need a rescue inhaler. You can continue to use the Combivent as your reschedule inhaler. When you finish that supply that you have, we can switch to a different rescue inhaler called albuterol that may be a bit cheaper.   Below is some information about things you can do to help with sleep.   Call me with any questions or concerns!  Catie Darnelle Maffucci, PharmD [phone]  Visit Information  Goals Addressed              This Visit's Progress     Patient Stated   .  PharmD "I want to make sure I am taking only necessary medications" (pt-stated)        CARE PLAN ENTRY (see longitudinal plan of care for additional care plan information)  Current Barriers:  . Social, financial, community barriers:  o Sad that she can't golf anymore due to mobility, but instead plays bridge, works crossword puzzles, plays games on her telephone . Polypharmacy; complex patient with multiple comorbidities including COPD, CAD, HTN, angina, CKD . eGFR 31 mL/min . [facility] doing adherence packaging for patient . COPD: Anoro 62.5/25 mcg daily, using QPM. Reports she has not needed Combivent but ~once weekly since being on Anoro. Grade D  . CAD- HTN: Amlodipine 2.5 QAM, losartan 100 mg QAM;  o Has not been checking BP at home recently, notes she needs to get back in the habit of doing this . CAD- Antianginal: ranolazine 500 mg BID, isosorbide 90 mg BID, NTG PRN, though has not needed in a while . CAD - ASCVD risk reduction: atorvastatin 40 mg daily (taking 2 20 mg tabs); clopidogrel 75 mg daily . GERD: esomeprazole 40 mg daily  . Insomnia: lorazepam 0.75 mg QPM, reports she needs every night, but still doesn't feel like she is sleeping as well as she would like to be. Denies caffeine afternoon, except the occasional  chocolate dessert. Denies naps and technology before bed.   Pharmacist Clinical Goal(s):  [place] Over the next 90 days, patient will work with PharmD and provider towards optimized medication management  Interventions: . Comprehensive medication review performed; medication list updated in electronic medical record . Inter-disciplinary care team collaboration (see longitudinal plan of care) . Continue Anoro daily w/ Combivent PRN. Can switch to albuterol PRN once Combivent supply runs out, if cheaper.  . Encouraged to restart checking BP periodically and documenting.  . Encouraged continue adherence to atorvastatin 40 mg daily.   Patient Self Care Activities:  . Patient will take medications as prescribed using pill box . Patient will notify provider with any questions or concerns  Please see past updates related to this goal by clicking on the "Past Updates" button in the selected goal         The patient verbalized understanding of instructions provided today and agreed to receive a mailed copy of patient instruction and/or educational materials. Plan:  - Scheduled f/u call in ~ 10 weeks  Catie Darnelle Maffucci, PharmD, [place], [place] [phone]    Insomnia Insomnia is a sleep disorder that makes it difficult to fall asleep or stay asleep. Insomnia can cause fatigue, low energy, difficulty concentrating, mood swings, and poor performance at work or school. There are three different ways to classify  insomnia:  Difficulty falling asleep.  Difficulty staying asleep.  Waking up too early in the morning. Any type of insomnia can be long-term (chronic) or short-term (acute). Both are common. Short-term insomnia usually lasts for three months or less. Chronic insomnia occurs at least three times a week for longer than three months. What are the causes? Insomnia may be caused by another condition, situation, or  substance, such as:  Anxiety.  Certain medicines.  Gastroesophageal reflux disease (GERD) or other gastrointestinal conditions.  Asthma or other breathing conditions.  Restless legs syndrome, sleep apnea, or other sleep disorders.  Chronic pain.  Menopause.  Stroke.  Abuse of alcohol, tobacco, or illegal drugs.  Mental health conditions, such as depression.  Caffeine.  Neurological disorders, such as Alzheimer's disease.  An overactive thyroid (hyperthyroidism). Sometimes, the cause of insomnia may not be known. What increases the risk? Risk factors for insomnia include:  Gender. Women are affected more often than men.  Age. Insomnia is more common as you get older.  Stress.  Lack of exercise.  Irregular work schedule or working night shifts.  Traveling between different time zones.  Certain medical and mental health conditions. What are the signs or symptoms? If you have insomnia, the main symptom is having trouble falling asleep or having trouble staying asleep. This may lead to other symptoms, such as:  Feeling fatigued or having low energy.  Feeling nervous about going to sleep.  Not feeling rested in the morning.  Having trouble concentrating.  Feeling irritable, anxious, or depressed. How is this diagnosed? This condition may be diagnosed based on:  Your symptoms and medical history. Your health care provider may ask about: ? Your sleep habits. ? Any medical conditions you have. ? Your mental health.  A physical exam. How is this treated? Treatment for insomnia depends on the cause. Treatment may focus on treating an underlying condition that is causing insomnia. Treatment may also include:  Medicines to help you sleep.  Counseling or therapy.  Lifestyle adjustments to help you sleep better. Follow these instructions at home: Eating and drinking   Limit or avoid alcohol, caffeinated beverages, and cigarettes, especially close to  bedtime. These can disrupt your sleep.  Do not eat a large meal or eat spicy foods right before bedtime. This can lead to digestive discomfort that can make it hard for you to sleep. Sleep habits   Keep a sleep diary to help you and your health care provider figure out what could be causing your insomnia. Write down: ? When you sleep. ? When you wake up during the night. ? How well you sleep. ? How rested you feel the next day. ? Any side effects of medicines you are taking. ? What you eat and drink.  Make your bedroom a dark, comfortable place where it is easy to fall asleep. ? Put up shades or blackout curtains to block light from outside. ? Use a white noise machine to block noise. ? Keep the temperature cool.  Limit screen use before bedtime. This includes: ? Watching TV. ? Using your smartphone, tablet, or computer.  Stick to a routine that includes going to bed and waking up at the same times every day and night. This can help you fall asleep faster. Consider making a quiet activity, such as reading, part of your nighttime routine.  Try to avoid taking naps during the day so that you sleep better at night.  Get out of bed if you are still awake after  15 minutes of trying to sleep. Keep the lights down, but try reading or doing a quiet activity. When you feel sleepy, go back to bed. General instructions  Take over-the-counter and prescription medicines only as told by your health care provider.  Exercise regularly, as told by your health care provider. Avoid exercise starting several hours before bedtime.  Use relaxation techniques to manage stress. Ask your health care provider to suggest some techniques that may work well for you. These may include: ? Breathing exercises. ? Routines to release muscle tension. ? Visualizing peaceful scenes.  Make sure that you drive carefully. Avoid driving if you feel very sleepy.  Keep all follow-up visits as told by your health care  provider. This is important. Contact a health care provider if:  You are tired throughout the day.  You have trouble in your daily routine due to sleepiness.  You continue to have sleep problems, or your sleep problems get worse. Get help right away if:  You have serious thoughts about hurting yourself or someone else. If you ever feel like you may hurt yourself or others, or have thoughts about taking your own life, get help right away. You can go to your nearest emergency department or call:  Your local emergency services (911 in the [place]  A suicide crisis helpline, such as the [place] at 539-236-1437. This is open 24 hours a day. Summary  Insomnia is a sleep disorder that makes it difficult to fall asleep or stay asleep.  Insomnia can be long-term (chronic) or short-term (acute).  Treatment for insomnia depends on the cause. Treatment may focus on treating an underlying condition that is causing insomnia.  Keep a sleep diary to help you and your health care provider figure out what could be causing your insomnia. This information is not intended to replace advice given to you by your health care provider. Make sure you discuss any questions you have with your health care provider. Document Revised: 08/18/2017 Document Reviewed: 06/15/2017 Elsevier Patient Education  2020 [employer].

## 2020-08-10 ENCOUNTER — Ambulatory Visit: Payer: Medicare Other | Admitting: Cardiovascular Disease

## 2020-08-25 ENCOUNTER — Telehealth (INDEPENDENT_AMBULATORY_CARE_PROVIDER_SITE_OTHER): Payer: Medicare Other | Admitting: Internal Medicine

## 2020-08-25 ENCOUNTER — Other Ambulatory Visit: Payer: Self-pay

## 2020-08-25 ENCOUNTER — Encounter: Payer: Self-pay | Admitting: Internal Medicine

## 2020-08-25 VITALS — Ht 63.0 in | Wt 126.0 lb

## 2020-08-25 DIAGNOSIS — J441 Chronic obstructive pulmonary disease with (acute) exacerbation: Secondary | ICD-10-CM

## 2020-08-25 DIAGNOSIS — J069 Acute upper respiratory infection, unspecified: Secondary | ICD-10-CM | POA: Diagnosis not present

## 2020-08-25 MED ORDER — DOXYCYCLINE HYCLATE 100 MG PO TABS: 100.0000 mg | ORAL_TABLET | Freq: Two times a day (BID) | ORAL | 0 refills | Status: DC

## 2020-08-25 MED ORDER — PREDNISONE 20 MG PO TABS: 20.0000 mg | ORAL_TABLET | Freq: Every day | ORAL | 0 refills | Status: DC

## 2020-08-25 NOTE — Progress Notes (Signed)
Tested negative for COVID-19. Onset of symptoms 3 days ago carted coughing up green mucous, fatigue, headache, and congestion.   COVID rapid home test were negative.

## 2020-08-26 NOTE — Progress Notes (Signed)
Telephone Note  I connected with [name]  on 08/25/20 at  4:30PM EST by telephone and verified that I am speaking with the correct person using two identifiers.  Location patient: home, [place] Location provider:work or home office Persons participating in the virtual visit: patient, provider pts daughter   I discussed the limitations of evaluation and management by telemedicine and the availability of in person appointments. The patient expressed understanding and agreed to proceed.   HPI: Acute visit  C/o cough w/o fever and congestion and fatigue x 3-4 days no sob. 2 great grankids had covid 19 recently she had a rapid and PCR covid tests and was negative. She is coughing green phelgm tried otc cough drops and cough meds h/o COPD has anoro and combivent prn at home    ROS: See pertinent positives and negatives per HPI.  Past Medical History:  Diagnosis Date  . C. difficile colitis   . CAD (coronary artery disease)    a. s/p CABG 1999; b. Nuclear 10/11, no scar or ischemia, EF 68%; b. [place] 06/18/14: no evidence of infarction or ischemia, EF 77%, low risk study; c. 02/2019 MV: EF 56%, ? mild lat ischemia->low risk.  . Carotid artery disease ([place])    a. Doppler January, 2012, 40-59% bilateral; b. 02/2017 U/S: 1-39% bilat ICA stenosis.  . Closed fracture pubis ([place]) 07/04/2017  . COPD (chronic obstructive pulmonary disease) ([place])   . Diffuse cystic mastopathy   . Diverticulitis    last colonoscopy  incomplete Jan 2011  . Dizziness    stable now (Oct 2011) patient tells me question of TIA, we will obtain records  . Gait abnormality    Patient complains of "wobbly gait", December, 2013  . GERD (gastroesophageal reflux disease)   . History of migraines   . Hx of CABG    a. 3 vessel in 1999  . Hyperlipidemia   . Hypertension   . Indigestion    3 weeks, October 2011  . Kidney stones   . Rheumatic fever   . Sinus bradycardia    Mild, December, 2013  . SOB  (shortness of breath)   . Syncopal episodes    after 18 holes of golf and increase hydrochlorothiazide, resolved     Past Surgical History:  Procedure Laterality Date  . APPENDECTOMY  1950  . BREAST BIOPSY Right 1994  . BREAST CYST ASPIRATION     multiple BIL  . CATARACT EXTRACTION Right 2009  . CATARACT EXTRACTION Left 2011  . COLONOSCOPY  [id number]   Dr. Jamal Collin  . CORONARY ARTERY BYPASS GRAFT  1999  . esophagus stretched    . TONSILLECTOMY  1939  . TOTAL ABDOMINAL HYSTERECTOMY  1968   due to metrorrhagia     Current Outpatient Medications:  .  amLODipine (NORVASC) 2.5 MG tablet, Take 1 tablet (2.5 mg total) by mouth daily., Disp: 90 tablet, Rfl: 3 .  atorvastatin (LIPITOR) 40 MG tablet, Take 1 tablet (40 mg total) by mouth daily., Disp: 90 tablet, Rfl: 1 .  Calcium Carbonate Antacid (CALCIUM CARBONATE PO), Take 1 capsule by mouth daily., Disp: , Rfl:  .  clopidogrel (PLAVIX) 75 MG tablet, Take 1 tablet (75 mg total) by mouth daily., Disp: 90 tablet, Rfl: 3 .  esomeprazole (NEXIUM) 40 MG capsule, Take 40 mg by mouth daily at 12 noon., Disp: , Rfl:  .  Ipratropium-Albuterol (COMBIVENT) 20-100 MCG/ACT AERS respimat, Inhale 1 puff into the lungs every 6 (six) hours as needed for wheezing.,  Disp: 4 g, Rfl: 6 .  isosorbide mononitrate (IMDUR) 60 MG 24 hr tablet, Take 1.5 tablets (90 mg total) by mouth 2 (two) times daily., Disp: 270 tablet, Rfl: 3 .  LORazepam (ATIVAN) 0.5 MG tablet, TAKE ONE AND ONE-HALF TABLET BY MOUTH ASNEEDED FOR INSOMNIA, Disp: 45 tablet, Rfl: 5 .  losartan (COZAAR) 100 MG tablet, Take 1 tablet (100 mg total) by mouth daily., Disp: 90 tablet, Rfl: 3 .  nitroGLYCERIN (NITROSTAT) 0.4 MG SL tablet, PLACE 1 TABLET UNDER TONGUE EVERY 5 MIN AS NEEDED FOR CHEST PAIN IF NO RELIEF IN15 MIN CALL 911 (MAX 3 TABS), Disp: 25 tablet, Rfl: 3 .  Probiotic Product (PROBIOTIC PO), Take 1 capsule by mouth daily., Disp: , Rfl:  .  umeclidinium-vilanterol (ANORO ELLIPTA) 62.5-25  MCG/INH AEPB, Inhale 1 puff into the lungs daily., Disp: 60 each, Rfl: 2 .  doxycycline (VIBRA-TABS) 100 MG tablet, Take 1 tablet (100 mg total) by mouth 2 (two) times daily. X 7 days with food, Disp: 14 tablet, Rfl: 0 .  predniSONE (DELTASONE) 20 MG tablet, Take 1 tablet (20 mg total) by mouth daily with breakfast., Disp: 7 tablet, Rfl: 0 .  propranolol (INDERAL) 20 MG tablet, TAKE ONE TABLET 3 TIMES DAILY AS NEEDED FOR TACHYCARDIA (Patient not taking: Reported on 08/25/2020), Disp: 270 tablet, Rfl: 3 .  ranolazine (RANEXA) 500 MG 12 hr tablet, Take 1 tablet (500 mg total) by mouth 2 (two) times daily. (Patient not taking: Reported on 08/25/2020), Disp: 180 tablet, Rfl: 3  EXAM:  VITALS per patient if applicable:  GENERAL: alert, oriented, appears well and in no acute distress  PSYCH/NEURO: pleasant and cooperative, no obvious depression or anxiety, speech and thought processing grossly intact  ASSESSMENT AND PLAN:  Discussed the following assessment and plan:  COPD exacerbation ([place]) - Plan: predniSONE (DELTASONE) 20 MG tablet x 7days, doxycycline (VIBRA-TABS) 100 MG tablet bid x 7 days  anoro with prn Combivent  Cough med and drops  If worsening call back will do CXR or go to ED or [facility] urgent care 2 covid tests rapid and PCR negative recently   -we discussed possible serious and likely etiologies, options for evaluation and workup, limitations of telemedicine visit vs in person visit, treatment, treatment risks and precautions.    I discussed the assessment and treatment plan with the patient. The patient was provided an opportunity to ask questions and all were answered. The patient agreed with the plan and demonstrated an understanding of the instructions.    Time spent 20 min Delorise Jackson, MD

## 2020-08-28 ENCOUNTER — Encounter: Payer: Self-pay | Admitting: *Deleted

## 2020-08-28 ENCOUNTER — Other Ambulatory Visit: Payer: Self-pay | Admitting: *Deleted

## 2020-08-28 NOTE — Patient Outreach (Signed)
[name]) Care Management  [place]  08/28/2020   [name] [birth year] [id number]   [place] Monthly Outreach  Referral Date:01/29/2020 Referral Source:Transfer from [place] Reason for Referral:Continued Disease Management Education Insurance:Medicare   Outreach Attempt:  Received telephone call back from patient.  HIPAA verified with patient.  Patient reporting she had a COVID exposure over Thanksgiving holiday but has tested negative.  Has had some cough and shortness of breath and feels much better since the start of antibiotics and steroids prescribed this week.  States cough is better and she has not had to use her rescue inhaler.  Denies swelling in lower extremities and states is has been better since she is wearing her compression hose during the daytime.  Discussed with patient I will be changing positions and she will be contacted by another [place] in the future.  Patient continues to agree to future outreaches.  Encounter Medications:  Outpatient Encounter Medications as of 08/28/2020  Medication Sig  . amLODipine (NORVASC) 2.5 MG tablet Take 1 tablet (2.5 mg total) by mouth daily.  Marland Kitchen atorvastatin (LIPITOR) 40 MG tablet Take 1 tablet (40 mg total) by mouth daily.  . Calcium Carbonate Antacid (CALCIUM CARBONATE PO) Take 1 capsule by mouth daily.  . clopidogrel (PLAVIX) 75 MG tablet Take 1 tablet (75 mg total) by mouth daily.  Marland Kitchen doxycycline (VIBRA-TABS) 100 MG tablet Take 1 tablet (100 mg total) by mouth 2 (two) times daily. X 7 days with food  . esomeprazole (NEXIUM) 40 MG capsule Take 40 mg by mouth daily at 12 noon.  . isosorbide mononitrate (IMDUR) 60 MG 24 hr tablet Take 1.5 tablets (90 mg total) by mouth 2 (two) times daily.  Marland Kitchen LORazepam (ATIVAN) 0.5 MG tablet TAKE ONE AND ONE-HALF TABLET BY MOUTH ASNEEDED FOR INSOMNIA  . losartan (COZAAR) 100 MG tablet Take 1 tablet (100 mg total) by mouth  daily.  . nitroGLYCERIN (NITROSTAT) 0.4 MG SL tablet PLACE 1 TABLET UNDER TONGUE EVERY 5 MIN AS NEEDED FOR CHEST PAIN IF NO RELIEF IN15 MIN CALL 911 (MAX 3 TABS)  . predniSONE (DELTASONE) 20 MG tablet Take 1 tablet (20 mg total) by mouth daily with breakfast.  . Probiotic Product (PROBIOTIC PO) Take 1 capsule by mouth daily.  Marland Kitchen umeclidinium-vilanterol (ANORO ELLIPTA) 62.5-25 MCG/INH AEPB Inhale 1 puff into the lungs daily.  . Ipratropium-Albuterol (COMBIVENT) 20-100 MCG/ACT AERS respimat Inhale 1 puff into the lungs every 6 (six) hours as needed for wheezing.  . propranolol (INDERAL) 20 MG tablet TAKE ONE TABLET 3 TIMES DAILY AS NEEDED FOR TACHYCARDIA (Patient not taking: No sig reported)  . ranolazine (RANEXA) 500 MG 12 hr tablet Take 1 tablet (500 mg total) by mouth 2 (two) times daily. (Patient not taking: No sig reported)   No facility-administered encounter medications on file as of 08/28/2020.    Functional Status:  In your present state of health, do you have any difficulty performing the following activities: 03/25/2020  Hearing? Y  Comment Hearing aid  Vision? N  Difficulty concentrating or making decisions? N  Walking or climbing stairs? Y  Comment Unsteady gait; currently doing balance exercises with PT  Dressing or bathing? N  Doing errands, shopping? N  Preparing Food and eating ? N  Using the Toilet? N  In the past six months, have you accidently leaked urine? Y  Comment Managed with daily liner  Do you have problems with loss of bowel control? N  Managing your Medications? N  Managing your Finances? N  Housekeeping or managing your Housekeeping? Y  Comment Maid assist  Some recent data might be hidden    Fall/Depression Screening: Fall Risk  08/25/2020 06/11/2020 05/26/2020  Falls in the past year? 0 0 0  Comment - - -  Number falls in past yr: 0 0 0  Injury with Fall? 0 0 -  Comment - - -  Risk for fall due to : - - -  Risk for fall due to: Comment - - -  Follow  up Falls evaluation completed Falls evaluation completed Falls evaluation completed;Education provided;Falls prevention discussed   PHQ 2/9 Scores 06/11/2020 05/26/2020 03/25/2020 03/09/2020 07/05/2019 06/21/2019 06/17/2019  PHQ - 2 Score 0 0 0 0 0 0 0  PHQ- 9 Score - - - - - - -    Goals Addressed            This Visit's Progress   . COMPLETED: [facility]) Learn More About My Health       Follow Up Date 09/17/20   - tell my story and reason for my visit - make a list of questions - ask questions - repeat what I heard to make sure I understand - bring a list of my medicines to the visit - speak up when I don't understand    Why is this important?   The best way to learn about your health and care is by talking to the doctor and nurse.  They will answer your questions and give you information in the way that you like best.    Notes:     . [facility]) Make and Keep All Appointments   On track    Timeframe:  Long-Range Goal Priority:  Medium Start Date:   07/14/2020                          Expected End Date:  03/18/21                       - call to cancel if needed - keep a calendar with appointment dates    Why is this important?   Part of staying healthy is seeing the doctor for follow-up care.  If you forget your appointments, there are some things you can do to stay on track.    Notes:  08/28/2020-attended telehealth appointment with providers office for COPD exacerbation, has scheduled PCP and Cardiology appointments    . [facility]) Manage My Cholesterol       Timeframe:  Long-Range Goal Priority:  Medium Start Date:  07/14/20                           Expected End Date:   03/18/21                      - eat smaller or less servings of red meat - fill half the plate with nonstarchy vegetables - read food labels for fat and fiber -able to verbalize increase dose of statin and compliant with taking increase dose    Why is this important?   Changing cholesterol starts with eating  heart-healthy foods.  Other steps may be to increase your activity and to quit if you smoke.    Notes:  08/28/20-continue to encourage heart healthy low cholesterol diet food options    . [facility]) Track and  Manage My Symptoms       Timeframe:  Long-Range Goal Priority:  High Start Date:  07/14/2020                           Expected End Date:  03/18/2021                       - develop a rescue plan - eliminate symptom triggers at home - follow rescue plan if symptoms flare-up - keep follow-up appointments -review COPD education mailed with action plan and zones  -wear compression hose throughout the day to help with ankle swelling   Why is this important?   Tracking your symptoms and other information about your health helps your doctor plan your care.  Write down the symptoms, the time of day, what you were doing and what medicine you are taking.  You will soon learn how to manage your symptoms.     Notes:  08/28/20-Reports experiencing COPD exacerbation this week and contacting primary care for antibiotics and steroids, congratulated patient for recognizing symptoms and following action plan      Appointments:  Attended Telehealth appointment with primary care on 08/25/2020.  Plan: Follow-up:  Patient agrees to Care Plan and Follow-up.  RN Health Coach will send primary care provider quarterly update. RN Health Coach will make next telephone outreach to patient within the month of March and patient agrees to future outreach.  [place] Coach [phone] [email or web address]

## 2020-08-28 NOTE — Patient Outreach (Signed)
[facility]) Care Management  [place]  08/28/2020   BERYLE BAGSBY 1925-12-19 [id number]   [place] Monthly Outreach  Referral Date:01/29/2020 Referral Source:Transfer from [place] Reason for Referral:Continued Disease Management Education Insurance:Medicare   Outreach Attempt:  Outreach attempt #1 to patient for follow up. No answer. RN Health Coach left voicemail message along with contact information.  Plan:  RN Health Coach will make another outreach attempt within the month of January if no return call back from patient.   [place] [phone] [email or web address]

## 2020-08-28 NOTE — Patient Instructions (Signed)
Goals Addressed            This Visit's Progress   . COMPLETED: [facility]) Learn More About My Health       Follow Up Date 09/17/20   - tell my story and reason for my visit - make a list of questions - ask questions - repeat what I heard to make sure I understand - bring a list of my medicines to the visit - speak up when I don't understand    Why is this important?   The best way to learn about your health and care is by talking to the doctor and nurse.  They will answer your questions and give you information in the way that you like best.    Notes:     . [facility]) Make and Keep All Appointments   On track    Timeframe:  Long-Range Goal Priority:  Medium Start Date:   07/14/2020                          Expected End Date:  03/18/21                       - call to cancel if needed - keep a calendar with appointment dates    Why is this important?   Part of staying healthy is seeing the doctor for follow-up care.  If you forget your appointments, there are some things you can do to stay on track.    Notes:  08/28/2020-attended telehealth appointment with providers office for COPD exacerbation, has scheduled PCP and Cardiology appointments    . [facility]) Manage My Cholesterol       Timeframe:  Long-Range Goal Priority:  Medium Start Date:  07/14/20                           Expected End Date:   03/18/21                      - eat smaller or less servings of red meat - fill half the plate with nonstarchy vegetables - read food labels for fat and fiber -able to verbalize increase dose of statin and compliant with taking increase dose    Why is this important?   Changing cholesterol starts with eating heart-healthy foods.  Other steps may be to increase your activity and to quit if you smoke.    Notes:  08/28/20-continue to encourage heart healthy low cholesterol diet food options    . [facility]) Track and Manage My Symptoms       Timeframe:  Long-Range Goal Priority:   High Start Date:  07/14/2020                           Expected End Date:  03/18/2021                       - develop a rescue plan - eliminate symptom triggers at home - follow rescue plan if symptoms flare-up - keep follow-up appointments -review COPD education mailed with action plan and zones  -wear compression hose throughout the day to help with ankle swelling   Why is this important?   Tracking your symptoms and other information about your health helps your doctor plan your care.  Write down the symptoms, the time of  day, what you were doing and what medicine you are taking.  You will soon learn how to manage your symptoms.     Notes:  08/28/20-Reports experiencing COPD exacerbation this week and contacting primary care for antibiotics and steroids, congratulated patient for recognizing symptoms and following action plan

## 2020-09-03 ENCOUNTER — Other Ambulatory Visit: Payer: Self-pay | Admitting: Cardiovascular Disease

## 2020-09-29 ENCOUNTER — Ambulatory Visit: Payer: Medicare Other | Admitting: Pharmacist

## 2020-09-29 DIAGNOSIS — I5032 Chronic diastolic (congestive) heart failure: Secondary | ICD-10-CM

## 2020-09-29 DIAGNOSIS — J449 Chronic obstructive pulmonary disease, unspecified: Secondary | ICD-10-CM

## 2020-09-29 DIAGNOSIS — I25118 Atherosclerotic heart disease of native coronary artery with other forms of angina pectoris: Secondary | ICD-10-CM

## 2020-09-29 DIAGNOSIS — E782 Mixed hyperlipidemia: Secondary | ICD-10-CM

## 2020-09-29 DIAGNOSIS — N183 Chronic kidney disease, stage 3 unspecified: Secondary | ICD-10-CM

## 2020-09-29 NOTE — Patient Instructions (Addendum)
Visit Information  Patient Care Plan: Medication Management    Problem Identified: COPD, CAD     Long-Range Goal: Disease Progression Prevention   Start Date: 09/29/2020  This Visit's Progress: On track  Priority: High  Note:   Current Barriers:  . Complex patient with multiple comorbidities including COPD, CAD, HTN, angina, CKD with a high risk of disease progression    Pharmacist Clinical Goal(s):  Marland Kitchen Over the next 90 days, patient will achieve adherence to monitoring guidelines and medication adherence to achieve therapeutic efficacy through collaboration with PharmD and provider.   Interventions: . 1:1 collaboration with [name], MD regarding development and update of comprehensive plan of care as evidenced by provider attestation and co-signature . Inter-disciplinary care team collaboration (see longitudinal plan of care) . Comprehensive medication review performed; medication list updated in electronic medical record  Health Maintenance: . Reports positive COVID test 09/13/20. Did not seek care for COVID diagnosis. Reports that she feels overall recovered except for energy - continues to be fatigued. Is going to get her hair washed/done this afternoon and is looking forward to getting out of the house.  Marland Kitchen Confirmed COVID full regimen + booster in November.  . Confirmed that [facility] still provides medications to the patient in adherence packaging.   CAD, HTN, angina: . Moderately well controlled; current treatment: HTN: amlodipine 2.5 mg daily, losartan 100 mg daily;  . Antianginal regimen: ranolazine 500 mg BID, isosorbide 90 mg BID, NTG PRN - reports occasional use recently.   . F/u with cardiology later this month. Encouraged to discuss episodes of angina requiring NTG in the post-COVID infection setting  . Recommended to continue current regimen at this time  Hyperlipidemia and secondary ASCVD prevention . Uncontrolled but anticipated to be improved;  current treatment: atorvastatin 40 mg daily (increased by PCP at last visit);  Marland Kitchen Antiplatelet regimen: clopidogrel 75 mg daily    . Denies any concerns since atorvastatin dose increase . Recommended to continue current regimen at this time  Chronic Obstructive Pulmonary Disease: Marland Kitchen Moderately well managed; current treatment: Anoro 62.5/25 mcg daily, Combivent PRN - though denies much use of Combivent since starting on Anoro therapy.  Marland Kitchen GOLD Classification: likely D . 1 exacerbations requiring treatment in the last 6 months  . If continued exacerbations, consider escalation to triple therapy w/ ICS. Given low symptom burden at this time, will avoid making changes. Continue Anoro daily.   Insomnia: . Uncontrolled; reports she has not been taking lorazepam very often, as she has not perceived much benefit from it.  . Encouraged to discuss w/ PCP at next visit. Reviewed goal of minimization of benzodiazepines, especially if not beneficial, given patient age and comorbidities increasing fall risk.   GERD: . Controlled per patient report; current regimen: esomeprazole 40 mg daily  . Recommend to continue current regimen at this time.   Patient Goals/Self-Care Activities . Over the next 90 days, patient will:  - take medications as prescribed focus on medication adherence by utilizing adherence packaging from community pharmacy check blood pressure periodocally, document, and provide at future appointments  Follow Up Plan: Telephone follow up appointment with care management team member scheduled for: ~ 8 weeks     The patient verbalized understanding of instructions, educational materials, and care plan provided today and declined offer to receive copy of patient instructions, educational materials, and care plan.   Plan: Telephone follow up appointment with care management team member scheduled for:  ~ 8 weeks  Catie Darnelle Maffucci, PharmD, [place], [place] Clinical Pharmacist [employer] at  [place]

## 2020-09-29 NOTE — Chronic Care Management (AMB) (Signed)
Chronic Care Management Pharmacy Note  09/29/2020 Name:  [name] MRN:  [id number] DOB:  [birth year]  Subjective: [name] is an 87 y.o. year old female who is a primary patient of Tullo, Aris Everts, MD.  The CCM team was consulted for assistance with disease management and care coordination needs.    Engaged with patient by telephone for follow up visit in response to provider referral for pharmacy case management and/or care coordination services.   Consent to Services:  The patient was given information about Chronic Care Management services, agreed to services, and gave verbal consent prior to initiation of services.  Please see initial visit note for detailed documentation.   Objective:  Lab Results  Component Value Date   CREATININE 1.53 (H) 06/11/2020   CREATININE 1.26 (H) 01/08/2020   CREATININE 1.48 (H) 12/30/2019    Lab Results  Component Value Date   HGBA1C 5.8 01/30/2017       Component Value Date/Time   CHOL 188 06/11/2020 1114   CHOL 143 06/18/2014 0419   TRIG 138.0 06/11/2020 1114   TRIG 126 06/18/2014 0419   HDL 56.60 06/11/2020 1114   HDL 46 06/18/2014 0419   CHOLHDL 3 06/11/2020 1114   VLDL 27.6 06/11/2020 1114   VLDL 25 06/18/2014 0419   LDLCALC 103 (H) 06/11/2020 1114   LDLCALC 72 06/18/2014 0419   LDLDIRECT 72.0 11/09/2015 1209    BP Readings from Last 3 Encounters:  06/11/20 (!) 130/52  04/08/20 126/60  04/07/20 140/60    Assessment/Interventions: Review of patient past medical history, allergies, medications, health status, including review of consultants reports, laboratory and other test data, was performed as part of comprehensive evaluation and provision of chronic care management services.   SDOH:  (Social Determinants of Health) assessments and interventions performed:  SDOH Interventions   Flowsheet Row Most Recent Value  SDOH Interventions   Financial Strain Interventions Intervention Not Indicated      CCM  Care Plan  Allergies  Allergen Reactions  . Ciprofloxacin     ? Caused c diff     Medications Reviewed Today    Reviewed by De Hollingshead, RPH-CPP (Pharmacist) on 09/29/20 at 52  Med List Status: <None>  Medication Order Taking? Sig Documenting Provider Last Dose Status Informant  amLODipine (NORVASC) 2.5 MG tablet [id number] Yes TAKE ONE TABLET EVERY DAY Gollan, Kathlene November, MD Taking Active   atorvastatin (LIPITOR) 40 MG tablet [id number] Yes Take 1 tablet (40 mg total) by mouth daily. [name], MD Taking Active   clopidogrel (PLAVIX) 75 MG tablet [id number] Yes Take 1 tablet (75 mg total) by mouth daily. Minna Merritts, MD Taking Active   esomeprazole (NEXIUM) 40 MG capsule [id number] Yes Take 40 mg by mouth daily at 12 noon. [provider] Taking Active   Ipratropium-Albuterol (COMBIVENT) 20-100 MCG/ACT AERS respimat [id number]  Inhale 1 puff into the lungs every 6 (six) hours as needed for wheezing. Minna Merritts, MD  Active   isosorbide mononitrate (IMDUR) 60 MG 24 hr tablet [id number] Yes Take 1.5 tablets (90 mg total) by mouth 2 (two) times daily. Minna Merritts, MD Taking Active   LORazepam (ATIVAN) 0.5 MG tablet [id number] No TAKE ONE AND ONE-HALF TABLET BY MOUTH ASNEEDED FOR INSOMNIA  Patient not taking: Reported on 09/29/2020   [name], MD Not Taking Active   losartan (COZAAR) 100 MG tablet [id number] Yes Take 1 tablet (100 mg total) by mouth daily. Minna Merritts,  MD Taking Active   nitroGLYCERIN (NITROSTAT) 0.4 MG SL tablet [id number] Yes PLACE 1 TABLET UNDER TONGUE EVERY 5 MIN AS NEEDED FOR CHEST PAIN IF NO RELIEF IN15 MIN CALL 911 (MAX 3 TABS) Gollan, Kathlene November, MD Taking Active   Probiotic Product (PROBIOTIC PO) [id number] Yes Take 1 capsule by mouth daily. [provider] Taking Active   propranolol (INDERAL) 20 MG tablet [id number] No TAKE ONE TABLET 3 TIMES DAILY AS NEEDED FOR TACHYCARDIA  Patient not taking: No sig reported    Minna Merritts, MD Not Taking Active   ranolazine (RANEXA) 500 MG 12 hr tablet [id number] Yes Take 1 tablet (500 mg total) by mouth 2 (two) times daily. Minna Merritts, MD Taking Active   umeclidinium-vilanterol [facility] ELLIPTA) 62.5-25 MCG/INH AEPB [id number] Yes Inhale 1 puff into the lungs daily. [name], MD Taking Active           Patient Active Problem List   Diagnosis Date Noted  . Grief 04/08/2020  . History of esophageal stricture 04/08/2020  . Leg swelling 03/09/2020  . Left-sided chest wall pain 01/10/2020  . Anorexia 01/10/2020  . B12 deficiency 01/09/2020  . Generalized weakness 07/14/2019  . Chest pain with high risk of acute coronary syndrome 06/18/2019  . Mild malnutrition ([place]) 03/25/2019  . Diastolic CHF, chronic ([place]) 03/25/2019  . Vitamin D deficiency 03/25/2019  . Melena 02/03/2018  . S/P excision of skin lesion, follow-up exam 12/26/2017  . Impingement syndrome of left shoulder region 07/04/2017  . Neck pain 07/04/2017  . Allergic rhinitis 12/31/2016  . SCC (squamous cell carcinoma), face 08/01/2016  . EKG abnormalities 06/17/2016  . Osteoporosis of forearm 02/13/2016  . Dizziness and giddiness 02/13/2016  . Insomnia secondary to anxiety 07/12/2015  . Anxiety disorder 07/12/2015  . Essential (primary) hypertension 10/07/2014  . Sinus bradycardia   . Hardening of the aorta (main artery of the heart) ([place]) 06/26/2014  . History of Clostridium difficile colitis 10/20/2013  . Personal history of other diseases of the digestive system 10/20/2013  . Low back pain 10/04/2013  . Encounter for general adult medical examination without abnormal findings 02/07/2013  . Chest pain at rest 12/26/2011  . COPD, moderate ([place]) 12/05/2011  . Presence of aortocoronary bypass graft   . Chronic kidney disease, stage III (moderate) ([place]) 09/25/2011  . Hyperlipidemia   . Hypertension   . SOB (shortness of breath)   . Coronary artery disease of native artery of  native heart with stable angina pectoris ([place])   . Carotid artery disease ([place])     Conditions to be addressed/monitored:  HLD and COPD      Patient Care Plan: Medication Management    Problem Identified: COPD, CAD     Long-Range Goal: Disease Progression Prevention   Start Date: 09/29/2020  This Visit's Progress: On track  Priority: High  Note:   Current Barriers:  . Complex patient with multiple comorbidities including COPD, CAD, HTN, angina, CKD with a high risk of disease progression    Pharmacist Clinical Goal(s):  Marland Kitchen Over the next 90 days, patient will achieve adherence to monitoring guidelines and medication adherence to achieve therapeutic efficacy through collaboration with PharmD and provider.   Interventions: . 1:1 collaboration with [name], MD regarding development and update of comprehensive plan of care as evidenced by provider attestation and co-signature . Inter-disciplinary care team collaboration (see longitudinal plan of care) . Comprehensive medication review performed; medication list updated in electronic medical record  Health  Maintenance: . Reports positive COVID test 09/13/20. Did not seek care for COVID diagnosis. Reports that she feels overall recovered except for energy - continues to be fatigued. Is going to get her hair washed/done this afternoon and is looking forward to getting out of the house.  Marland Kitchen Confirmed COVID full regimen + booster in November.  . Confirmed that [facility] pharmacy still provides medications to the patient in adherence packaging.   CAD, HTN, angina: . Moderately well controlled; current treatment: HTN: amlodipine 2.5 mg daily, losartan 100 mg daily;  . Antianginal regimen: ranolazine 500 mg BID, isosorbide 90 mg BID, NTG PRN - reports occasional use recently.   . F/u with cardiology later this month. Encouraged to discuss episodes of angina requiring NTG in the post-COVID infection setting  . Recommended to continue  current regimen at this time  Hyperlipidemia and secondary ASCVD prevention . Uncontrolled but anticipated to be improved; current treatment: atorvastatin 40 mg daily (increased by PCP at last visit);  Marland Kitchen Antiplatelet regimen: clopidogrel 75 mg daily    . Denies any concerns since atorvastatin dose increase . Recommended to continue current regimen at this time  Chronic Obstructive Pulmonary Disease: Marland Kitchen Moderately well managed; current treatment: Anoro 62.5/25 mcg daily, Combivent PRN - though denies much use of Combivent since starting on Anoro therapy.  Marland Kitchen GOLD Classification: likely D . 1 exacerbations requiring treatment in the last 6 months  . If continued exacerbations, consider escalation to triple therapy w/ ICS. Given low symptom burden at this time, will avoid making changes. Continue Anoro daily.   Insomnia: . Uncontrolled; reports she has not been taking lorazepam very often, as she has not perceived much benefit from it.  . Encouraged to discuss w/ PCP at next visit. Reviewed goal of minimization of benzodiazepines, especially if not beneficial, given patient age and comorbidities increasing fall risk.   GERD: . Controlled per patient report; current regimen: esomeprazole 40 mg daily  . Recommend to continue current regimen at this time.   Patient Goals/Self-Care Activities . Over the next 90 days, patient will:  - take medications as prescribed focus on medication adherence by utilizing adherence packaging from community pharmacy check blood pressure periodocally, document, and provide at future appointments  Follow Up Plan: Telephone follow up appointment with care management team member scheduled for: ~ 8 weeks     Medication Assistance: Over income for inhaler patient assistance. Confirms affordability at this time  Follow Up:  Patient agrees to Care Plan and Follow-up.  Plan: Telephone follow up appointment with care management team member scheduled for:  ~ 8  weeks  Catie Darnelle Maffucci, PharmD, [place], [place] Clinical Pharmacist [employer] at [employer] [phone]

## 2020-10-11 NOTE — Progress Notes (Signed)
Cardiology Office Note  Date:  10/12/2020   ID:  [name], DOB 1926/05/03, MRN [id number]  PCP:  [name], MD   Chief Complaint  Patient presents with  . Other    6 month follow up. Patient c.o Occ swelling in ankles.  Meds reviewed verbally with patient.     HPI:  [name] is a pleasant 87 year old woman with a history of  CAD, bypass surgery in 1999, Stress test October 2017 with no ischemia COPD, Smoking for 40 years who quit in 1985,  chronic renal insufficiency,  hypertension,  hyperlipidemia,  40-50% bilateral carotid arterial disease  EF >60% in 05/2015  C. difficile in January 2015. H/o fall wet floor  02/2017, Suffered a pelvic fracture, left shoulder pain who presents for routine followup of her coronary artery disease  In follow-up today reports that she got Covid infection over Christmas holiday when everybody got together for Christmas dinner Everybody at the dinner got Covid Difficult time recovering, low energy, has been very sedentary Used to play golf, has not been doing anything recently  Not requiring much nitro Previous visits did not want stress test or catheterization Rare use of inhalers  Rare fluttering in chest at night, wakes her up Takes proopranolol as needed Happens rarely, does not want to have to take a pill /beta-blocker every day  Prior stress test in 2020 and 2019 Prior stress test reviewed with her September 2020, low risk  EKG personally reviewed by myself on todays visit Shows normal sinus rhythm with rate 69 bpm APCs, nonspecific ST-T wave abnormality anterior precordial leads  Other past medical history reviewed Seen in the emergency room for dizziness and fatigue on December 30, 2019 Felt to be dehydrated, wheezing from allergies/COPD exacerbation Creatinine 1.48 Treated with low-dose prednisone several days, inhaler  Seen October 21, 2019 for paroxysmal tachycardia Previously with reproducible left pectoral  pain on palpation Chronic mild shortness of breath  Previously treated with chemotherapy for cancer on right lower extremity  Hospital admission 12/21/2016 for COPD exacerbation Syncope, dehydrated  PMH:   has a past medical history of C. difficile colitis, CAD (coronary artery disease), Carotid artery disease ([place]), Closed fracture pubis ([place]) (07/04/2017), COPD (chronic obstructive pulmonary disease) ([place]), Diffuse cystic mastopathy, Diverticulitis, Dizziness, Gait abnormality, GERD (gastroesophageal reflux disease), History of migraines, CABG, Hyperlipidemia, Hypertension, Indigestion, Kidney stones, Rheumatic fever, Sinus bradycardia, SOB (shortness of breath), and Syncopal episodes.  PSH:    Past Surgical History:  Procedure Laterality Date  . APPENDECTOMY  1950  . BREAST BIOPSY Right 1994  . BREAST CYST ASPIRATION     multiple BIL  . CATARACT EXTRACTION Right 2009  . CATARACT EXTRACTION Left 2011  . COLONOSCOPY  [id number]   Dr. Jamal Collin  . CORONARY ARTERY BYPASS GRAFT  1999  . esophagus stretched    . TONSILLECTOMY  1939  . TOTAL ABDOMINAL HYSTERECTOMY  1968   due to metrorrhagia    Current Outpatient Medications  Medication Sig Dispense Refill  . amLODipine (NORVASC) 2.5 MG tablet TAKE ONE TABLET EVERY DAY 90 tablet 0  . atorvastatin (LIPITOR) 40 MG tablet Take 1 tablet (40 mg total) by mouth daily. 90 tablet 1  . clopidogrel (PLAVIX) 75 MG tablet Take 1 tablet (75 mg total) by mouth daily. 90 tablet 3  . esomeprazole (NEXIUM) 40 MG capsule Take 40 mg by mouth daily at 12 noon.    . Ipratropium-Albuterol (COMBIVENT) 20-100 MCG/ACT AERS respimat Inhale 1 puff into the lungs every  6 (six) hours as needed for wheezing. 4 g 6  . isosorbide mononitrate (IMDUR) 60 MG 24 hr tablet Take 1.5 tablets (90 mg total) by mouth 2 (two) times daily. 270 tablet 3  . LORazepam (ATIVAN) 0.5 MG tablet TAKE ONE AND ONE-HALF TABLET BY MOUTH ASNEEDED FOR INSOMNIA 45 tablet 5  . losartan (COZAAR) 100  MG tablet Take 1 tablet (100 mg total) by mouth daily. 90 tablet 3  . nitroGLYCERIN (NITROSTAT) 0.4 MG SL tablet PLACE 1 TABLET UNDER TONGUE EVERY 5 MIN AS NEEDED FOR CHEST PAIN IF NO RELIEF IN15 MIN CALL 911 (MAX 3 TABS) 25 tablet 3  . Probiotic Product (PROBIOTIC PO) Take 1 capsule by mouth daily.    . propranolol (INDERAL) 20 MG tablet TAKE ONE TABLET 3 TIMES DAILY AS NEEDED FOR TACHYCARDIA 270 tablet 3  . ranolazine (RANEXA) 500 MG 12 hr tablet Take 1 tablet (500 mg total) by mouth 2 (two) times daily. 180 tablet 3  . umeclidinium-vilanterol (ANORO ELLIPTA) 62.5-25 MCG/INH AEPB Inhale 1 puff into the lungs daily. 60 each 2   No current facility-administered medications for this visit.     Allergies:   Ciprofloxacin   Social History:  The patient  reports that she quit smoking about 37 years ago. Her smoking use included cigarettes. She has a 20.00 pack-year smoking history. She has never used smokeless tobacco. She reports previous alcohol use. She reports that she does not use drugs.   Family History:   family history includes Heart disease in her father and mother.    Review of Systems: Review of Systems  Constitutional: Positive for malaise/fatigue.  HENT: Negative.   Respiratory: Negative.   Cardiovascular: Negative.   Gastrointestinal: Negative.   Musculoskeletal: Negative.   Neurological: Negative.   Psychiatric/Behavioral: Negative.   All other systems reviewed and are negative.   PHYSICAL EXAM: VS:  BP 138/62 (BP Location: Left Arm, Patient Position: Sitting, Cuff Size: Normal)   Pulse 69   Ht 5' 3.5" (1.613 m)   Wt 126 lb (57.2 kg)   SpO2 97%   BMI 21.97 kg/m  , BMI Body mass index is 21.97 kg/m. Constitutional:  oriented to person, place, and time. No distress.  HENT:  Head: Grossly normal Eyes:  no discharge. No scleral icterus.  Neck: No JVD, no carotid bruits  Cardiovascular: Regular rate and rhythm, no murmurs appreciated Pulmonary/Chest: Clear to  auscultation bilaterally, no wheezes or rails Abdominal: Soft.  no distension.  no tenderness.  Musculoskeletal: Normal range of motion Neurological:  normal muscle tone. Coordination normal. No atrophy Skin: Skin warm and dry Psychiatric: normal affect, pleasant   Recent Labs: 12/30/2019: B Natriuretic Peptide 144.0; Hemoglobin 13.2; Platelets 226 01/08/2020: TSH 1.64 06/11/2020: ALT 11; BUN 21; Creatinine, Ser 1.53; Potassium 4.2; Sodium 139    Lipid Panel Lab Results  Component Value Date   CHOL 188 06/11/2020   HDL 56.60 06/11/2020   LDLCALC 103 (H) 06/11/2020   TRIG 138.0 06/11/2020    Wt Readings from Last 3 Encounters:  10/12/20 126 lb (57.2 kg)  08/25/20 126 lb (57.2 kg)  06/11/20 126 lb 12.8 oz (57.5 kg)     ASSESSMENT AND PLAN:  Essential hypertension -  Blood pressure is well controlled on today's visit. No changes made to the medications.  Coronary artery disease involving native coronary artery of native heart without angina pectoris -  Chronic stable angina Rare use of nitro, no further testing indicated at this time Recommend walking program  Shortness of breath/weakness Recommend regular walking program, use of inhalers if needed  History of bypass surgery Nitro as needed Previously declined catheterization and stress testing  Carotid stenosis Less than 39% bilaterally, significant plaque noted Cholesterol at goal No further testing needed  Hyperlipidemia Cholesterol is at goal on the current lipid regimen. No changes to the medications were made.     Total encounter time more than 25 minutes  Greater than 50% was spent in counseling and coordination of care with the patient   Disposition:   F/U  6 months   Orders Placed This Encounter  Procedures  . EKG 12-Lead     Signed, Esmond Plants, M.D., Ph.D. 10/12/2020  [place], [place]

## 2020-10-12 ENCOUNTER — Other Ambulatory Visit: Payer: Self-pay

## 2020-10-12 ENCOUNTER — Ambulatory Visit (INDEPENDENT_AMBULATORY_CARE_PROVIDER_SITE_OTHER): Payer: Medicare Other | Admitting: Cardiovascular Disease

## 2020-10-12 ENCOUNTER — Encounter: Payer: Self-pay | Admitting: Cardiovascular Disease

## 2020-10-12 VITALS — BP 138/62 | HR 69 | Ht 63.5 in | Wt 126.0 lb

## 2020-10-12 DIAGNOSIS — Z951 Presence of aortocoronary bypass graft: Secondary | ICD-10-CM | POA: Diagnosis not present

## 2020-10-12 DIAGNOSIS — I700 Atherosclerosis of aorta: Secondary | ICD-10-CM

## 2020-10-12 DIAGNOSIS — N183 Chronic kidney disease, stage 3 unspecified: Secondary | ICD-10-CM | POA: Diagnosis not present

## 2020-10-12 DIAGNOSIS — I10 Essential (primary) hypertension: Secondary | ICD-10-CM

## 2020-10-12 DIAGNOSIS — E785 Hyperlipidemia, unspecified: Secondary | ICD-10-CM | POA: Diagnosis not present

## 2020-10-12 DIAGNOSIS — I25118 Atherosclerotic heart disease of native coronary artery with other forms of angina pectoris: Secondary | ICD-10-CM

## 2020-10-12 DIAGNOSIS — I6523 Occlusion and stenosis of bilateral carotid arteries: Secondary | ICD-10-CM

## 2020-10-12 NOTE — Patient Instructions (Signed)
Medication Instructions:  No changes  If you need a refill on your cardiac medications before your next appointment, please call your pharmacy.    Lab work: No new labs needed   If you have labs (blood work) drawn today and your tests are completely normal, you will receive your results only by: . MyChart Message (if you have MyChart) OR . A paper copy in the mail If you have any lab test that is abnormal or we need to change your treatment, we will call you to review the results.   Testing/Procedures: No new testing needed   Follow-Up: At [facility], you and your health needs are our priority.  As part of our continuing mission to provide you with exceptional heart care, we have created designated Provider Care Teams.  These Care Teams include your primary Cardiologist (physician) and Advanced Practice Providers (APPs -  Physician Assistants and Nurse Practitioners) who all work together to provide you with the care you need, when you need it.  . You will need a follow up appointment in 6 months  . Providers on your designated Care Team:   . Christopher Berge, NP . Ryan Dunn, PA-C . Jacquelyn Visser, PA-C  Any Other Special Instructions Will Be Listed Below (If Applicable).  COVID-19 Vaccine Information can be found at: [email or web address] For questions related to vaccine distribution or appointments, please email [email or web address] or call [phone].     

## 2020-10-15 DIAGNOSIS — L814 Other melanin hyperpigmentation: Secondary | ICD-10-CM | POA: Diagnosis not present

## 2020-10-15 DIAGNOSIS — L570 Actinic keratosis: Secondary | ICD-10-CM | POA: Diagnosis not present

## 2020-10-15 DIAGNOSIS — X32XXXA Exposure to sunlight, initial encounter: Secondary | ICD-10-CM | POA: Diagnosis not present

## 2020-10-15 DIAGNOSIS — Z08 Encounter for follow-up examination after completed treatment for malignant neoplasm: Secondary | ICD-10-CM | POA: Diagnosis not present

## 2020-10-15 DIAGNOSIS — Z85828 Personal history of other malignant neoplasm of skin: Secondary | ICD-10-CM | POA: Diagnosis not present

## 2020-11-03 ENCOUNTER — Ambulatory Visit (INDEPENDENT_AMBULATORY_CARE_PROVIDER_SITE_OTHER): Payer: Medicare Other | Admitting: Pharmacist

## 2020-11-03 DIAGNOSIS — E782 Mixed hyperlipidemia: Secondary | ICD-10-CM | POA: Diagnosis not present

## 2020-11-03 DIAGNOSIS — J449 Chronic obstructive pulmonary disease, unspecified: Secondary | ICD-10-CM

## 2020-11-03 DIAGNOSIS — I25118 Atherosclerotic heart disease of native coronary artery with other forms of angina pectoris: Secondary | ICD-10-CM

## 2020-11-03 DIAGNOSIS — N183 Chronic kidney disease, stage 3 unspecified: Secondary | ICD-10-CM

## 2020-11-03 DIAGNOSIS — I10 Essential (primary) hypertension: Secondary | ICD-10-CM

## 2020-11-03 DIAGNOSIS — I5032 Chronic diastolic (congestive) heart failure: Secondary | ICD-10-CM

## 2020-11-03 MED ORDER — ANORO ELLIPTA 62.5-25 MCG/INH IN AEPB: 1.0000 | INHALATION_SPRAY | Freq: Every day | RESPIRATORY_TRACT | 2 refills | Status: DC

## 2020-11-03 NOTE — Patient Instructions (Signed)
Ms. Bolding,   Each Anoro inhaler should have 30 doses, so should last you 1 month. Talk to the team at the pharmacy about how to use the inhaler.   This company has a patient assistance program. If you make <$33,975 per year (for a 1 person household size), you meet the income criteria. You then need to spend at least $600 on prescription copays in 2022. Once you have met those two criteria, we can apply for patient assistance and get the Anoro for free through the drug company through the end of the year. We will continue to check on your total copay spend when we have follow up calls, but you can ask [facility] for what your total is thus far in 2022.   Try melatonin every evening to help with sleep. You need to take this medication every evening for about 1-2 weeks to determine if you are going to have benefit. This is a safer medication (with less of a risk of causing you to fall) than other options for sleep.   Call me with any questions or concerns!  Catie Darnelle Maffucci, PharmD [phone]  Visit Information  PATIENT GOALS: Goals Addressed              This Visit's Progress     Patient Stated   .  Medication Monitoring (pt-stated)        Patient Goals/Self-Care Activities . Over the next 90 days, patient will:  - take medications as prescribed focus on medication adherence by utilizing adherence packaging from community pharmacy check blood pressure periodocally, document, and provide at future appointments         The patient verbalized understanding of instructions, educational materials, and care plan provided today and agreed to receive a mailed copy of patient instructions, educational materials, and care plan.   Plan: Telephone follow up appointment with care management team member scheduled for:  ~ 10 weeks  Catie Darnelle Maffucci, PharmD, [place], [place] Clinical Pharmacist [employer] at [employer] [phone]

## 2020-11-03 NOTE — Chronic Care Management (AMB) (Signed)
Chronic Care Management Pharmacy Note  11/03/2020 Name:  [name] MRN:  [id number] DOB:  May 28, 1926  Subjective: [name] is an 87 y.o. year old female who is a primary patient of Tullo, Aris Everts, MD.  The CCM team was consulted for assistance with disease management and care coordination needs.    Engaged with patient by telephone for follow up visit in response to provider referral for pharmacy case management and/or care coordination services.   Consent to Services:  The patient was given information about Chronic Care Management services, agreed to services, and gave verbal consent prior to initiation of services.  Please see initial visit note for detailed documentation.   Objective:  Lab Results  Component Value Date   CREATININE 1.53 (H) 06/11/2020   CREATININE 1.26 (H) 01/08/2020   CREATININE 1.48 (H) 12/30/2019    Lab Results  Component Value Date   HGBA1C 5.8 01/30/2017       Component Value Date/Time   CHOL 188 06/11/2020 1114   CHOL 143 06/18/2014 0419   TRIG 138.0 06/11/2020 1114   TRIG 126 06/18/2014 0419   HDL 56.60 06/11/2020 1114   HDL 46 06/18/2014 0419   CHOLHDL 3 06/11/2020 1114   VLDL 27.6 06/11/2020 1114   VLDL 25 06/18/2014 0419   LDLCALC 103 (H) 06/11/2020 1114   LDLCALC 72 06/18/2014 0419   LDLDIRECT 72.0 11/09/2015 1209    BP Readings from Last 3 Encounters:  10/12/20 138/62  06/11/20 (!) 130/52  04/08/20 126/60    Assessment: Review of patient past medical history, allergies, medications, health status, including review of consultants reports, laboratory and other test data, was performed as part of comprehensive evaluation and provision of chronic care management services.   SDOH:  (Social Determinants of Health) assessments and interventions performed:  SDOH Interventions   Flowsheet Row Most Recent Value  SDOH Interventions   Financial Strain Interventions Other (Comment)  [discussed patient assistance  eligibility]      CCM Care Plan  Allergies  Allergen Reactions  . Ciprofloxacin     ? Caused c diff     Medications Reviewed Today    Reviewed by De Hollingshead, RPH-CPP (Pharmacist) on 11/03/20 at 1039  Med List Status: <None>  Medication Order Taking? Sig Documenting Provider Last Dose Status Informant  amLODipine (NORVASC) 2.5 MG tablet [id number] Yes TAKE ONE TABLET EVERY DAY Gollan, Kathlene November, MD Taking Active   atorvastatin (LIPITOR) 40 MG tablet [id number] Yes Take 1 tablet (40 mg total) by mouth daily. [name], MD Taking Active   clopidogrel (PLAVIX) 75 MG tablet [id number] Yes Take 1 tablet (75 mg total) by mouth daily. Minna Merritts, MD Taking Active   esomeprazole (NEXIUM) 40 MG capsule [id number] Yes Take 40 mg by mouth daily at 12 noon. [provider] Taking Active   Ipratropium-Albuterol (COMBIVENT) 20-100 MCG/ACT AERS respimat [id number] No Inhale 1 puff into the lungs every 6 (six) hours as needed for wheezing.  Patient not taking: Reported on 11/03/2020   Minna Merritts, MD Not Taking Active   isosorbide mononitrate (IMDUR) 60 MG 24 hr tablet [id number] Yes Take 1.5 tablets (90 mg total) by mouth 2 (two) times daily. Minna Merritts, MD Taking Active   LORazepam (ATIVAN) 0.5 MG tablet [id number] No TAKE ONE AND ONE-HALF TABLET BY MOUTH ASNEEDED FOR INSOMNIA  Patient not taking: Reported on 11/03/2020   [name], MD Not Taking Active   losartan (COZAAR) 100 MG tablet  [id number] Yes Take 1 tablet (100 mg total) by mouth daily. Minna Merritts, MD Taking Active   nitroGLYCERIN (NITROSTAT) 0.4 MG SL tablet [id number] Yes PLACE 1 TABLET UNDER TONGUE EVERY 5 MIN AS NEEDED FOR CHEST PAIN IF NO RELIEF IN15 MIN CALL 911 (MAX 3 TABS) Gollan, Kathlene November, MD Taking Active   Probiotic Product (PROBIOTIC PO) [id number] Yes Take 1 capsule by mouth daily. [provider] Taking Active   propranolol (INDERAL) 20 MG tablet [id number] Yes TAKE ONE TABLET  3 TIMES DAILY AS NEEDED FOR TACHYCARDIA Gollan, Kathlene November, MD Taking Active   ranolazine (RANEXA) 500 MG 12 hr tablet [id number] Yes Take 1 tablet (500 mg total) by mouth 2 (two) times daily. Minna Merritts, MD Taking Active   umeclidinium-vilanterol [place] ELLIPTA) 62.5-25 MCG/INH AEPB [id number] No Inhale 1 puff into the lungs daily.  Patient not taking: Reported on 11/03/2020   [name], MD Not Taking Active   vitamin B-12 (CYANOCOBALAMIN) 500 MCG tablet [id number] Yes Take 500 mcg by mouth daily. [provider] Taking Active           Patient Active Problem List   Diagnosis Date Noted  . Grief 04/08/2020  . History of esophageal stricture 04/08/2020  . Leg swelling 03/09/2020  . Left-sided chest wall pain 01/10/2020  . Anorexia 01/10/2020  . B12 deficiency 01/09/2020  . Generalized weakness 07/14/2019  . Chest pain with high risk of acute coronary syndrome 06/18/2019  . Mild malnutrition ([place]) 03/25/2019  . Diastolic CHF, chronic ([place]) 03/25/2019  . Vitamin D deficiency 03/25/2019  . Melena 02/03/2018  . S/P excision of skin lesion, follow-up exam 12/26/2017  . Impingement syndrome of left shoulder region 07/04/2017  . Neck pain 07/04/2017  . Allergic rhinitis 12/31/2016  . SCC (squamous cell carcinoma), face 08/01/2016  . EKG abnormalities 06/17/2016  . Osteoporosis of forearm 02/13/2016  . Dizziness and giddiness 02/13/2016  . Insomnia secondary to anxiety 07/12/2015  . Anxiety disorder 07/12/2015  . Essential (primary) hypertension 10/07/2014  . Sinus bradycardia   . Hardening of the aorta (main artery of the heart) ([place]) 06/26/2014  . History of Clostridium difficile colitis 10/20/2013  . Personal history of other diseases of the digestive system 10/20/2013  . Low back pain 10/04/2013  . Encounter for general adult medical examination without abnormal findings 02/07/2013  . Chest pain at rest 12/26/2011  . COPD, moderate ([place]) 12/05/2011  . Presence  of aortocoronary bypass graft   . Chronic kidney disease, stage III (moderate) ([place]) 09/25/2011  . Hyperlipidemia   . Hypertension   . SOB (shortness of breath)   . Coronary artery disease of native artery of native heart with stable angina pectoris ([place])   . Carotid artery disease ([place])     Conditions to be addressed/monitored: CAD, HTN, HLD and COPD  Care Plan : Medication Management  Updates made by De Hollingshead, RPH-CPP since 11/03/2020 12:00 AM    Problem: COPD, CAD     Long-Range Goal: Disease Progression Prevention   Start Date: 09/29/2020  This Visit's Progress: On track  Recent Progress: On track  Priority: High  Note:   Current Barriers:  . Complex patient with multiple comorbidities including COPD, CAD, HTN, angina, CKD with a high risk of disease progression    Pharmacist Clinical Goal(s):  Marland Kitchen Over the next 90 days, patient will achieve adherence to monitoring guidelines and medication adherence to achieve therapeutic efficacy through collaboration with PharmD and provider.   Interventions: .  1:1 collaboration with [name], MD regarding development and update of comprehensive plan of care as evidenced by provider attestation and co-signature . Inter-disciplinary care team collaboration (see longitudinal plan of care) . Comprehensive medication review performed; medication list updated in electronic medical record  Health Maintenance: . Confirmed that [facility] still provides medications to the patient in adherence packaging. Reports that they are trying to sync her medications so that she can pick up all at one time every month . Endorses in improvement in "brain fog" s/p COVID diagnosis at Christmas.   CAD, HTN, angina: . Moderately well controlled; current treatment: HTN: amlodipine 2.5 mg daily, losartan 100 mg daily . Denies recent home BP monitoring. Last clinic reading at cardiology appt is at goal.  . Antianginal regimen: ranolazine  500 mg BID, isosorbide 90 mg BID, propranolol 20 mg PRN tachycardia, NTG PRN angina - reports occasional NTG use (<1 time per week), and infrequent propranolol use (does not remember the last time she used) . Recent appt w/ cardiology, discussed her NTG use. Discussed addition of medication to target angina/palpitations, but patient declined. Continue to follow for frequency of use. Reminded to seek emergency services if no resolution of angina after 2 dose.s   Hyperlipidemia and secondary ASCVD prevention . Uncontrolled but anticipated to be improved; current treatment: atorvastatin 40 mg daily (increased by PCP at last visit);  Marland Kitchen Antiplatelet regimen: clopidogrel 75 mg daily    . Recheck lipid panel at next PCP visit.  Marland Kitchen Recommended to continue current regimen at this time  Chronic Obstructive Pulmonary Disease: Marland Kitchen Moderately well managed; current treatment: Anoro 62.5/25 mcg daily- though notes that for the last several weeks, the dose counter on the front of the inhaler (has been red), Combivent PRN (no use lately) . GOLD Classification: likely D . 0 exacerbations requiring treatment in the last 6 months  . Reviewed refill hx. Last Anoro refill 06/2020. Explained to patient that each Anoro inhaler has 30 doses, only lasts 30 days. Likely she has not been receiving any medication for a few months now. Sent refill on Anoro to pharmacy, educated patient to speak with a pharmacist at [facility] for administration demonstration. Patient verbalized understanding.  . Discussed Anoro patient assistance through [place]. Will mail information about eligibility criteria and will follow for eligibility moving forward.   Insomnia: . Uncontrolled; notes she tried to fill lorazepam the other day and script was expired.  . Daily schedule: wakes up variable times, goes to bed 9-11 pm depending on if she stays up reading. Very little naps during the day, only if she dozes off while reading. Coffee in the morning,  infrequent coke in the afternoon, still very active during the day. Reports remote hx melatonin, but doesn't remember how long she took it or if it provided benefit.  . Discussed concerns of CNS sedation, increased fall risk with benzodiazepines. Suggested another trial of OTC melatonin regularly. Discussed adequate trial of 1-2 weeks.  . Encouraged healthy sleep hygiene habits.   GERD: . Controlled per patient report; current regimen: esomeprazole 40 mg daily  . Recommend to continue current regimen at this time.   Patient Goals/Self-Care Activities . Over the next 90 days, patient will:  - take medications as prescribed focus on medication adherence by utilizing adherence packaging from community pharmacy check blood pressure periodocally, document, and provide at future appointments  Follow Up Plan: Telephone follow up appointment with care management team member scheduled for: ~ 10 weeks  Medication Assistance: Discussed Anoro patient assistance program, will follow for eligibility.   Follow Up:  Patient agrees to Care Plan and Follow-up.  Plan: Telephone follow up appointment with care management team member scheduled for:  ~ 10 weeks  Catie Darnelle Maffucci, PharmD, [place], [place] Clinical Pharmacist [employer] at [employer] [phone]

## 2020-11-07 ENCOUNTER — Other Ambulatory Visit: Payer: Self-pay | Admitting: Internal Medicine

## 2020-11-17 ENCOUNTER — Other Ambulatory Visit: Payer: Self-pay | Admitting: *Deleted

## 2020-11-17 NOTE — Patient Outreach (Signed)
[facility]) Care Management  [place]  11/17/2020   [name] 02/28/1926 [id number]  RN Health Coach telephone call to patient.  [employer] compliance verified. Per patient her breathing is good. Patient stated her balance is not good but she has not had any recent falls. Patient stated she ambulated using a golf club. She stated she exercises when the mood hits her.  She stated she does have some problems sleeping. She uses melatonin to help. Her appetite is fair. She drinks boost occasionally.  Patient is using inhalers as prescribed. She has agreed to follow up outreach calls.   Encounter Medications:  Outpatient Encounter Medications as of 11/17/2020  Medication Sig  . amLODipine (NORVASC) 2.5 MG tablet TAKE ONE TABLET EVERY DAY  . atorvastatin (LIPITOR) 40 MG tablet TAKE ONE TABLET BY MOUTH EVERY DAY  . clopidogrel (PLAVIX) 75 MG tablet Take 1 tablet (75 mg total) by mouth daily.  Marland Kitchen esomeprazole (NEXIUM) 40 MG capsule Take 40 mg by mouth daily at 12 noon.  . Ipratropium-Albuterol (COMBIVENT) 20-100 MCG/ACT AERS respimat Inhale 1 puff into the lungs every 6 (six) hours as needed for wheezing. (Patient not taking: Reported on 11/03/2020)  . isosorbide mononitrate (IMDUR) 60 MG 24 hr tablet Take 1.5 tablets (90 mg total) by mouth 2 (two) times daily.  Marland Kitchen LORazepam (ATIVAN) 0.5 MG tablet TAKE ONE AND ONE-HALF TABLET BY MOUTH ASNEEDED FOR INSOMNIA (Patient not taking: Reported on 11/03/2020)  . losartan (COZAAR) 100 MG tablet Take 1 tablet (100 mg total) by mouth daily.  . nitroGLYCERIN (NITROSTAT) 0.4 MG SL tablet PLACE 1 TABLET UNDER TONGUE EVERY 5 MIN AS NEEDED FOR CHEST PAIN IF NO RELIEF IN15 MIN CALL 911 (MAX 3 TABS)  . Probiotic Product (PROBIOTIC PO) Take 1 capsule by mouth daily.  . propranolol (INDERAL) 20 MG tablet TAKE ONE TABLET 3 TIMES DAILY AS NEEDED FOR TACHYCARDIA  . ranolazine (RANEXA) 500 MG 12 hr tablet Take 1 tablet (500 mg total) by mouth 2 (two)  times daily.  Marland Kitchen umeclidinium-vilanterol (ANORO ELLIPTA) 62.5-25 MCG/INH AEPB Inhale 1 puff into the lungs daily.  . vitamin B-12 (CYANOCOBALAMIN) 500 MCG tablet Take 500 mcg by mouth daily.   No facility-administered encounter medications on file as of 11/17/2020.    Functional Status:  In your present state of health, do you have any difficulty performing the following activities: 03/25/2020  Hearing? Y  Comment Hearing aid  Vision? N  Difficulty concentrating or making decisions? N  Walking or climbing stairs? Y  Comment Unsteady gait; currently doing balance exercises with PT  Dressing or bathing? N  Doing errands, shopping? N  Preparing Food and eating ? N  Using the Toilet? N  In the past six months, have you accidently leaked urine? Y  Comment Managed with daily liner  Do you have problems with loss of bowel control? N  Managing your Medications? N  Managing your Finances? N  Housekeeping or managing your Housekeeping? Y  Comment Maid assist  Some recent data might be hidden    Fall/Depression Screening: Fall Risk  08/25/2020 06/11/2020 05/26/2020  Falls in the past year? 0 0 0  Comment - - -  Number falls in past yr: 0 0 0  Injury with Fall? 0 0 -  Comment - - -  Risk for fall due to : - - -  Risk for fall due to: Comment - - -  Follow up Falls evaluation completed Falls evaluation completed Falls evaluation  completed;Education provided;Falls prevention discussed   PHQ 2/9 Scores 11/17/2020 06/11/2020 05/26/2020 03/25/2020 03/09/2020 07/05/2019 06/21/2019  PHQ - 2 Score 0 0 0 0 0 0 0  PHQ- 9 Score - - - - - - -    Assessment:  Goals Addressed            This Visit's Progress   . [facility]) Make and Keep All Appointments       Timeframe:  Long-Range Goal Priority:  Medium Start Date:   07/14/2020                          Expected End Date:  [id number]                 Follow up [id number]   - call to cancel if needed - keep a calendar with appointment dates    Why is this  important?   Part of staying healthy is seeing the doctor for follow-up care.  If you forget your appointments, there are some things you can do to stay on track.    Notes:     . [facility]) Manage My Cholesterol       Timeframe:  Long-Range Goal Priority:  Medium Start Date:  07/14/20                           Expected End Date:   [id number]     - eat smaller or less servings of red meat - fill half the plate with nonstarchy vegetables - read food labels for fat and fiber -able to verbalize increase dose of statin and compliant with taking increase dose    Why is this important?   Changing cholesterol starts with eating heart-healthy foods.  Other steps may be to increase your activity and to quit if you smoke.        Marland Kitchen [facility]) Track and Manage My Symptoms       Timeframe:  Long-Range Goal Priority:  High Start Date:  07/14/2020                           Expected End Date:  [id number]  Follow up [id number]   - develop a rescue plan - eliminate symptom triggers at home - follow rescue plan if symptoms flare-up - keep follow-up appointments -review COPD education mailed with action plan and zones  -wear compression hose throughout the day to help with ankle swelling   Why is this important?   Tracking your symptoms and other information about your health helps your doctor plan your care.  Write down the symptoms, the time of day, what you were doing and what medicine you are taking.  You will soon learn how to manage your symptoms.     Notes:         Plan:  Follow-up:  Patient agrees to Care Plan and Follow-up. Provided educational material on COPD activity Provided eating plan on COPD RN sent update assessment to PCP RN provided boost coupons RN will follow up within the month of August  [name] [place] Management [phone]

## 2020-11-17 NOTE — Patient Instructions (Addendum)
  Goals Addressed            This Visit's Progress   . [facility]) Make and Keep All Appointments       Timeframe:  Long-Range Goal Priority:  Medium Start Date:   07/14/2020                          Expected End Date:  [id number]                 Follow up [id number]   - call to cancel if needed - keep a calendar with appointment dates    Why is this important?   Part of staying healthy is seeing the doctor for follow-up care.  If you forget your appointments, there are some things you can do to stay on track.    Notes:     . [facility]) Manage My Cholesterol       Timeframe:  Long-Range Goal Priority:  Medium Start Date:  07/14/20                           Expected End Date:   [id number]     - eat smaller or less servings of red meat - fill half the plate with nonstarchy vegetables - read food labels for fat and fiber -able to verbalize increase dose of statin and compliant with taking increase dose    Why is this important?   Changing cholesterol starts with eating heart-healthy foods.  Other steps may be to increase your activity and to quit if you smoke.        Marland Kitchen [facility]) Track and Manage My Symptoms       Timeframe:  Long-Range Goal Priority:  High Start Date:  07/14/2020                           Expected End Date:  [id number]  Follow up [id number]   - develop a rescue plan - eliminate symptom triggers at home - follow rescue plan if symptoms flare-up - keep follow-up appointments -review COPD education mailed with action plan and zones  -wear compression hose throughout the day to help with ankle swelling   Why is this important?   Tracking your symptoms and other information about your health helps your doctor plan your care.  Write down the symptoms, the time of day, what you were doing and what medicine you are taking.  You will soon learn how to manage your symptoms.     Notes:

## 2020-12-09 DIAGNOSIS — D3132 Benign neoplasm of left choroid: Secondary | ICD-10-CM | POA: Diagnosis not present

## 2020-12-09 DIAGNOSIS — H353221 Exudative age-related macular degeneration, left eye, with active choroidal neovascularization: Secondary | ICD-10-CM | POA: Diagnosis not present

## 2020-12-11 ENCOUNTER — Ambulatory Visit (INDEPENDENT_AMBULATORY_CARE_PROVIDER_SITE_OTHER): Payer: Medicare Other | Admitting: Internal Medicine

## 2020-12-11 ENCOUNTER — Other Ambulatory Visit: Payer: Self-pay

## 2020-12-11 ENCOUNTER — Ambulatory Visit: Payer: Medicare Other | Admitting: Internal Medicine

## 2020-12-11 ENCOUNTER — Encounter: Payer: Self-pay | Admitting: Internal Medicine

## 2020-12-11 VITALS — BP 114/68 | HR 70 | Temp 98.0°F | Ht 63.5 in | Wt 128.0 lb

## 2020-12-11 DIAGNOSIS — E559 Vitamin D deficiency, unspecified: Secondary | ICD-10-CM | POA: Diagnosis not present

## 2020-12-11 DIAGNOSIS — I6523 Occlusion and stenosis of bilateral carotid arteries: Secondary | ICD-10-CM

## 2020-12-11 DIAGNOSIS — I10 Essential (primary) hypertension: Secondary | ICD-10-CM | POA: Diagnosis not present

## 2020-12-11 DIAGNOSIS — R079 Chest pain, unspecified: Secondary | ICD-10-CM | POA: Diagnosis not present

## 2020-12-11 DIAGNOSIS — N183 Chronic kidney disease, stage 3 unspecified: Secondary | ICD-10-CM

## 2020-12-11 DIAGNOSIS — E782 Mixed hyperlipidemia: Secondary | ICD-10-CM | POA: Diagnosis not present

## 2020-12-11 DIAGNOSIS — J449 Chronic obstructive pulmonary disease, unspecified: Secondary | ICD-10-CM

## 2020-12-11 DIAGNOSIS — E538 Deficiency of other specified B group vitamins: Secondary | ICD-10-CM | POA: Diagnosis not present

## 2020-12-11 LAB — COMPREHENSIVE METABOLIC PANEL
ALT: 10 U/L (ref 0–35)
AST: 11 U/L (ref 0–37)
Albumin: 4.3 g/dL (ref 3.5–5.2)
Alkaline Phosphatase: 71 U/L (ref 39–117)
BUN: 22 mg/dL (ref 6–23)
CO2: 24 mEq/L (ref 19–32)
Calcium: 8.7 mg/dL (ref 8.4–10.5)
Chloride: 106 mEq/L (ref 96–112)
Creatinine, Ser: 1.42 mg/dL — ABNORMAL HIGH (ref 0.40–1.20)
GFR: 32.15 mL/min — ABNORMAL LOW (ref >60.00)
Glucose, Bld: 106 mg/dL — ABNORMAL HIGH (ref 70–99)
Potassium: 4.2 mEq/L (ref 3.5–5.1)
Sodium: 141 mEq/L (ref 135–145)
Total Bilirubin: 0.6 mg/dL (ref 0.2–1.2)
Total Protein: 6.1 g/dL (ref 6.0–8.3)

## 2020-12-11 LAB — VITAMIN B12: Vitamin B-12: 1084 pg/mL — ABNORMAL HIGH (ref 211–911)

## 2020-12-11 LAB — VITAMIN D 25 HYDROXY (VIT D DEFICIENCY, FRACTURES): VITD: 32.35 ng/mL (ref 30.00–100.00)

## 2020-12-11 MED ORDER — ZOSTER VAC RECOMB ADJUVANTED 50 MCG/0.5ML IM SUSR: 0.5000 mL | Freq: Once | INTRAMUSCULAR | 1 refills | Status: AC

## 2020-12-11 MED ORDER — NYSTATIN 100000 UNIT/ML MT SUSP: 5.0000 mL | Freq: Every day | OROMUCOSAL | 2 refills | Status: DC

## 2020-12-11 NOTE — Progress Notes (Signed)
Subjective:  Patient ID: [name], female    DOB: [birth year]  Age: 87 y.o. MRN: [id number]  CC: The primary encounter diagnosis was Mixed hyperlipidemia. Diagnoses of B12 deficiency, Vitamin D deficiency, Chest pain at rest, COPD, moderate ([place]), Primary hypertension, and Stage 3 chronic kidney disease, unspecified whether stage 3a or 3b CKD ([place]) were also pertinent to this visit.  HPI [name] presents for FOLLOW UP on hypertension, hyperlipidemia, and COPD.  This visit occurred during the SARS-CoV-2 public health emergency.  Safety protocols were in place, including screening questions prior to the visit, additional usage of staff PPE, and extensive cleaning of exam room while observing appropriate contact time as indicated for disinfecting solutions.   [name] is a 87 yr old female with COPD , CAD s/p CABG , hypertension and hyperlipidemia presents for follow up,  She feels generally well and has no acute issues.  No recnet hospitalizations or falls. .  She  is  confused about medications.  Doesn't think she is taking ranolazine.  Picked up jan 24 for 90 days supply per pharmacy .  "I take 7 meds rx in the morning.  I take  4 non prescription medications later on,    And at night I take 15 mg Imdur and another  pink pill at night .  Using ntg prn,  Last time 3 weeks ago for CP that occurred while in bed.  Took 2 before CP resolved.   Lives independently.  No caregiver  Uses a pill box that she fills weekly     Outpatient Medications Prior to Visit  Medication Sig Dispense Refill  . amLODipine (NORVASC) 2.5 MG tablet TAKE ONE TABLET EVERY DAY 90 tablet 0  . atorvastatin (LIPITOR) 40 MG tablet TAKE ONE TABLET BY MOUTH EVERY DAY 90 tablet 1  . clopidogrel (PLAVIX) 75 MG tablet Take 1 tablet (75 mg total) by mouth daily. 90 tablet 3  . esomeprazole (NEXIUM) 40 MG capsule Take 40 mg by mouth daily at 12 noon.    . Ipratropium-Albuterol (COMBIVENT) 20-100 MCG/ACT AERS  respimat Inhale 1 puff into the lungs every 6 (six) hours as needed for wheezing. 4 g 6  . isosorbide mononitrate (IMDUR) 60 MG 24 hr tablet Take 1.5 tablets (90 mg total) by mouth 2 (two) times daily. 270 tablet 3  . LORazepam (ATIVAN) 0.5 MG tablet TAKE ONE AND ONE-HALF TABLET BY MOUTH ASNEEDED FOR INSOMNIA 45 tablet 5  . losartan (COZAAR) 100 MG tablet Take 1 tablet (100 mg total) by mouth daily. 90 tablet 3  . nitroGLYCERIN (NITROSTAT) 0.4 MG SL tablet PLACE 1 TABLET UNDER TONGUE EVERY 5 MIN AS NEEDED FOR CHEST PAIN IF NO RELIEF IN15 MIN CALL 911 (MAX 3 TABS) 25 tablet 3  . Probiotic Product (PROBIOTIC PO) Take 1 capsule by mouth daily.    . propranolol (INDERAL) 20 MG tablet TAKE ONE TABLET 3 TIMES DAILY AS NEEDED FOR TACHYCARDIA 270 tablet 3  . ranolazine (RANEXA) 500 MG 12 hr tablet Take 1 tablet (500 mg total) by mouth 2 (two) times daily. 180 tablet 3  . umeclidinium-vilanterol (ANORO ELLIPTA) 62.5-25 MCG/INH AEPB Inhale 1 puff into the lungs daily. 60 each 2  . vitamin B-12 (CYANOCOBALAMIN) 500 MCG tablet Take 500 mcg by mouth daily.     No facility-administered medications prior to visit.    Review of Systems;  Patient denies headache, fevers, malaise, unintentional weight loss, skin rash, eye pain, sinus congestion and sinus pain, sore throat,  dysphagia,  hemoptysis , cough, dyspnea, wheezing, chest pain, palpitations, orthopnea, edema, abdominal pain, nausea, melena, diarrhea, constipation, flank pain, dysuria, hematuria, urinary  Frequency, nocturia, numbness, tingling, seizures,  Focal weakness, Loss of consciousness,  Tremor, insomnia, depression, anxiety, and suicidal ideation.      Objective:  BP 114/68   Pulse 70   Temp 98 F (36.7 C) (Oral)   Ht 5' 3.5" (1.613 m)   Wt 128 lb (58.1 kg)   SpO2 98%   BMI 22.32 kg/m   BP Readings from Last 3 Encounters:  12/11/20 114/68  10/12/20 138/62  06/11/20 (!) 130/52    Wt Readings from Last 3 Encounters:  12/11/20 128 lb  (58.1 kg)  10/12/20 126 lb (57.2 kg)  08/25/20 126 lb (57.2 kg)    General appearance: alert, cooperative and appears stated age Ears: normal TM's and external ear canals both ears Throat: lips, mucosa, and tongue normal; teeth and gums normal Neck: no adenopathy, no carotid bruit, supple, symmetrical, trachea midline and thyroid not enlarged, symmetric, no tenderness/mass/nodules Back: symmetric, no curvature. ROM normal. No CVA tenderness. Lungs: clear to auscultation bilaterally Heart: regular rate and rhythm, S1, S2 normal, no murmur, click, rub or gallop Abdomen: soft, non-tender; bowel sounds normal; no masses,  no organomegaly Pulses: 2+ and symmetric Skin: Skin color, texture, turgor normal. No rashes or lesions Lymph nodes: Cervical, supraclavicular, and axillary nodes normal.  Lab Results  Component Value Date   HGBA1C 5.8 01/30/2017   HGBA1C 5.8 09/26/2016    Lab Results  Component Value Date   CREATININE 1.42 (H) 12/11/2020   CREATININE 1.53 (H) 06/11/2020   CREATININE 1.26 (H) 01/08/2020    Lab Results  Component Value Date   WBC 9.9 12/30/2019   HGB 13.2 12/30/2019   HCT 40.8 12/30/2019   PLT 226 12/30/2019   GLUCOSE 106 (H) 12/11/2020   CHOL 188 06/11/2020   TRIG 138.0 06/11/2020   HDL 56.60 06/11/2020   LDLDIRECT 72.0 11/09/2015   LDLCALC 103 (H) 06/11/2020   ALT 10 12/11/2020   AST 11 12/11/2020   NA 141 12/11/2020   K 4.2 12/11/2020   CL 106 12/11/2020   CREATININE 1.42 (H) 12/11/2020   BUN 22 12/11/2020   CO2 24 12/11/2020   TSH 1.64 01/08/2020   INR 0.9 01/31/2018   HGBA1C 5.8 01/30/2017   MICROALBUR 2.5 (H) 12/26/2011    MM 3D SCREEN BREAST BILATERAL  Result Date: 04/16/2020 CLINICAL DATA:  Screening. EXAM: DIGITAL SCREENING BILATERAL MAMMOGRAM WITH TOMO AND CAD COMPARISON:  Previous exam(s). ACR Breast Density Category c: The breast tissue is heterogeneously dense, which may obscure small masses. FINDINGS: There are no findings  suspicious for malignancy. Images were processed with CAD. IMPRESSION: No mammographic evidence of malignancy. A result letter of this screening mammogram will be mailed directly to the patient. RECOMMENDATION: Screening mammogram in one year. (Code:SM-B-01Y) BI-RADS CATEGORY  1: Negative. Electronically Signed   By: Lajean Manes M.D.   On: 04/16/2020 13:51    Assessment & Plan:   Problem List Items Addressed This Visit      Unprioritized   B12 deficiency   Relevant Orders   Vitamin B12 (Completed)   Vitamin D deficiency   Relevant Orders   VITAMIN D 25 Hydroxy (Vit-D Deficiency, Fractures) (Completed)   Hyperlipidemia - Primary   Relevant Orders   Comprehensive metabolic panel (Completed)   Hypertension    Well controlled on current regimen of losartan and amlodipine. . Renal function stable, no  changes today.  Lab Results  Component Value Date   CREATININE 1.42 (H) 12/11/2020         COPD, moderate (HCC)    Currently asymptomatic using Anoro once daily . Some tongue discomfort  That  Has been attributed to thrushT, although exam is not consistent with diagnosis,  Trial of nystatin oral suspension .       Chronic kidney disease, stage III (moderate) (HCC)    Diagnosis reviewed with patien. t Renal function is at baseline with avoidance of NSAIDs.  She is on an ARB for control of hypertension,, and statin for control of hyperlipidemia.   Lab Results  Component Value Date   CREATININE 1.42 (H) 12/11/2020   Lab Results  Component Value Date   NA 141 12/11/2020   K 4.2 12/11/2020   CL 106 12/11/2020   CO2 24 12/11/2020         Chest pain at rest    Atypical  , occurring at rest and relieved with NTD   Denies dysphagia. No chagne  to regimen          I am having [name] start on nystatin and Zoster Vaccine Adjuvanted. I am also having her maintain her Ipratropium-Albuterol, LORazepam, esomeprazole, clopidogrel, isosorbide mononitrate, losartan,  nitroGLYCERIN, propranolol, ranolazine, Probiotic Product (PROBIOTIC PO), amLODipine, vitamin B-12, Anoro Ellipta, and atorvastatin.  Meds ordered this encounter  Medications  . nystatin (MYCOSTATIN) 100000 UNIT/ML suspension    Sig: Use as directed 5 mLs (500,000 Units total) in the mouth or throat daily. To rinse mouth after using inhaler    Dispense:  150 mL    Refill:  2  . Zoster Vaccine Adjuvanted [facility]) injection    Sig: Inject 0.5 mLs into the muscle once for 1 dose.    Dispense:  1 each    Refill:  1   A total of 40 minutes of face to face time was spent with patient more than half of which was spent in counselling and coordination of care    There are no discontinued medications.  Follow-up: No follow-ups on file.   [name], MD

## 2020-12-11 NOTE — Patient Instructions (Addendum)
Please go home and check your medications .   You should be taking ranolazine  Twice daily   Your tongue may be irritated from thrush.  If this is correct,  You will get relief from Nystatin liquid that you swish around your mouth after you use your inhaler .  For the first few days I want you to use it three times daily to resolve your infection . After that,  Start using it once daily after your inhaler     You can get your Shingles shot at your pharmacy so I have printed you a prescription for it.  (it requires a 2nd dose 2 too 6 months after the first one) .  It will cause you to have flu  like symptoms for 2 days

## 2020-12-13 NOTE — Assessment & Plan Note (Addendum)
Well controlled on current regimen of losartan and amlodipine. . Renal function stable, no changes today.  Lab Results  Component Value Date   CREATININE 1.42 (H) 12/11/2020

## 2020-12-13 NOTE — Assessment & Plan Note (Addendum)
Currently asymptomatic using Anoro once daily . Some tongue discomfort  That  Has been attributed to thrushT, although exam is not consistent with diagnosis,  Trial of nystatin oral suspension .

## 2020-12-13 NOTE — Assessment & Plan Note (Signed)
Diagnosis reviewed with patien. t Renal function is at baseline with avoidance of NSAIDs.  She is on an ARB for control of hypertension,, and statin for control of hyperlipidemia.   Lab Results  Component Value Date   CREATININE 1.42 (H) 12/11/2020   Lab Results  Component Value Date   NA 141 12/11/2020   K 4.2 12/11/2020   CL 106 12/11/2020   CO2 24 12/11/2020

## 2020-12-13 NOTE — Assessment & Plan Note (Addendum)
Atypical  , occurring at rest and relieved with NTD   Denies dysphagia. No chagne  to regimen

## 2020-12-29 DIAGNOSIS — Z23 Encounter for immunization: Secondary | ICD-10-CM | POA: Diagnosis not present

## 2021-01-02 DIAGNOSIS — R059 Cough, unspecified: Secondary | ICD-10-CM | POA: Diagnosis not present

## 2021-01-02 DIAGNOSIS — J069 Acute upper respiratory infection, unspecified: Secondary | ICD-10-CM | POA: Diagnosis not present

## 2021-01-05 ENCOUNTER — Telehealth: Payer: Self-pay | Admitting: Internal Medicine

## 2021-01-05 ENCOUNTER — Telehealth: Payer: Self-pay

## 2021-01-05 MED ORDER — CHERATUSSIN AC 100-10 MG/5ML PO SOLN: 5.0000 mL | Freq: Three times a day (TID) | ORAL | 0 refills | Status: DC | PRN

## 2021-01-05 MED ORDER — NYSTATIN 100000 UNIT/ML MT SUSP: 5.0000 mL | Freq: Four times a day (QID) | OROMUCOSAL | 0 refills | Status: DC

## 2021-01-05 NOTE — Telephone Encounter (Signed)
Went to walk in clinic on Saturday and they prescribed her an antibiotic and nasal spray. Pt stated that they did not give her anything for cough and she has been coughing "her head off". Pt is taking OTC cough syrup but it is not helping at all. Pt also stated that the thrush has not gone away that she got after her inhalers were switched. Pt would like to see if she could get something for the cough and the thrush.

## 2021-01-05 NOTE — Telephone Encounter (Signed)
Patient has been treated for thrush and still has it. She went to urgent care on Saturday for a cough and is cough really bad. She was given an antibiotic, nose drops, but no cough medication. No appointments available at this office.

## 2021-01-05 NOTE — Telephone Encounter (Signed)
Cheratussin cough syrup and Nystatin oral suspension sent to [facility]

## 2021-01-06 NOTE — Telephone Encounter (Signed)
Number busy

## 2021-01-06 NOTE — Telephone Encounter (Signed)
Spoke with pt and she stated that she got the medication yesterday and took it last night. Medication did help her cough and she was able to sleep for at least 4 hours straight.

## 2021-01-12 ENCOUNTER — Telehealth: Payer: Medicare Other

## 2021-01-12 ENCOUNTER — Ambulatory Visit (INDEPENDENT_AMBULATORY_CARE_PROVIDER_SITE_OTHER): Payer: Medicare Other | Admitting: Pharmacist

## 2021-01-12 DIAGNOSIS — E782 Mixed hyperlipidemia: Secondary | ICD-10-CM

## 2021-01-12 DIAGNOSIS — N183 Chronic kidney disease, stage 3 unspecified: Secondary | ICD-10-CM

## 2021-01-12 DIAGNOSIS — I25118 Atherosclerotic heart disease of native coronary artery with other forms of angina pectoris: Secondary | ICD-10-CM | POA: Diagnosis not present

## 2021-01-12 DIAGNOSIS — J449 Chronic obstructive pulmonary disease, unspecified: Secondary | ICD-10-CM

## 2021-01-12 DIAGNOSIS — I10 Essential (primary) hypertension: Secondary | ICD-10-CM

## 2021-01-12 NOTE — Chronic Care Management (AMB) (Signed)
Chronic Care Management Pharmacy Note  01/12/2021 Name:  [name] MRN:  [id number] DOB:  [birth year]  Subjective: [name] is an 87 y.o. year old female who is a primary patient of Tullo, Aris Everts, MD.  The CCM team was consulted for assistance with disease management and care coordination needs.    Engaged with patient by telephone for follow up visit in response to provider referral for pharmacy case management and/or care coordination services.   Consent to Services:  The patient was given information about Chronic Care Management services, agreed to services, and gave verbal consent prior to initiation of services.  Please see initial visit note for detailed documentation.   Patient Care Team: [name], MD as PCP - General (Internal Medicine) Minna Merritts, MD as PCP - Cardiology (Cardiology) Minna Merritts, MD (Cardiology) Christene Lye, MD (General Surgery) Karlyn Agee, MD (Unknown Physician Specialty) De Hollingshead, RPH-CPP as Pharmacist (Pharmacist) Pleasant, Eppie Gibson, RN as [place] Management  Recent office visits:  3/25 - PCP - continue regimen, get Shingles vaccine, ?Thrush  Recent consult visits:  4/16 - [facility] for URI- tx doxycyline + azelastine. Patient completed both. Completed cough syrup as well   Hospital visits: None  Objective:  Lab Results  Component Value Date   CREATININE 1.42 (H) 12/11/2020   CREATININE 1.53 (H) 06/11/2020   CREATININE 1.26 (H) 01/08/2020    Lab Results  Component Value Date   HGBA1C 5.8 01/30/2017   Last diabetic Eye exam: No results found for: HMDIABEYEEXA  Last diabetic Foot exam: No results found for: HMDIABFOOTEX      Component Value Date/Time   CHOL 188 06/11/2020 1114   CHOL 143 06/18/2014 0419   TRIG 138.0 06/11/2020 1114   TRIG 126 06/18/2014 0419   HDL 56.60 06/11/2020 1114   HDL 46 06/18/2014 0419   CHOLHDL 3 06/11/2020 1114    VLDL 27.6 06/11/2020 1114   VLDL 25 06/18/2014 0419   LDLCALC 103 (H) 06/11/2020 1114   LDLCALC 72 06/18/2014 0419   LDLDIRECT 72.0 11/09/2015 1209    Hepatic Function Latest Ref Rng & Units 12/11/2020 06/11/2020 07/08/2019  Total Protein 6.0 - 8.3 g/dL 6.1 6.4 6.2(L)  Albumin 3.5 - 5.2 g/dL 4.3 4.3 3.7  AST 0 - 37 U/L '11 13 18  ' ALT 0 - 35 U/L '10 11 12  ' Alk Phosphatase 39 - 117 U/L 71 56 50  Total Bilirubin 0.2 - 1.2 mg/dL 0.6 0.7 0.7  Bilirubin, Direct 0.0 - 0.3 mg/dL - - -    Lab Results  Component Value Date/Time   TSH 1.64 01/08/2020 12:03 PM   TSH 2.08 07/11/2019 01:20 PM    CBC Latest Ref Rng & Units 12/30/2019 07/08/2019 06/18/2019  WBC 4.0 - 10.5 K/uL 9.9 8.1 6.1  Hemoglobin 12.0 - 15.0 g/dL 13.2 13.5 12.5  Hematocrit 36.0 - 46.0 % 40.8 39.9 37.7  Platelets 150 - 400 K/uL 226 187 154    Lab Results  Component Value Date/Time   VD25OH 32.35 12/11/2020 02:18 PM   VD25OH 36.35 12/08/2016 08:52 AM    Clinical ASCVD: Yes    Social History   Tobacco Use  Smoking Status Former Smoker  . Packs/day: 0.50  . Years: 40.00  . Pack years: 20.00  . Types: Cigarettes  . Quit date: 09/20/1983  . Years since quitting: 37.3  Smokeless Tobacco Never Used   BP Readings from Last 3 Encounters:  12/11/20 114/68  10/12/20 138/62  06/11/20 (!) 130/52   Pulse Readings from Last 3 Encounters:  12/11/20 70  10/12/20 69  06/11/20 96   Wt Readings from Last 3 Encounters:  12/11/20 128 lb (58.1 kg)  10/12/20 126 lb (57.2 kg)  08/25/20 126 lb (57.2 kg)    Assessment: Review of patient past medical history, allergies, medications, health status, including review of consultants reports, laboratory and other test data, was performed as part of comprehensive evaluation and provision of chronic care management services.   SDOH:  (Social Determinants of Health) assessments and interventions performed:  SDOH Interventions   Flowsheet Row Most Recent Value  SDOH Interventions    Financial Strain Interventions Other (Comment)  [will evaluate assistance moving forward]      CCM Care Plan  Allergies  Allergen Reactions  . Ciprofloxacin     ? Caused c diff     Medications Reviewed Today    Reviewed by De Hollingshead, RPH-CPP (Pharmacist) on 01/12/21 at 70  Med List Status: <None>  Medication Order Taking? Sig Documenting Provider Last Dose Status Informant  amLODipine (NORVASC) 2.5 MG tablet [id number] Yes TAKE ONE TABLET EVERY DAY Rockey Situ Kathlene November, MD Taking Active   atorvastatin (LIPITOR) 40 MG tablet [id number] Yes TAKE ONE TABLET BY MOUTH EVERY DAY [name], MD Taking Active   clopidogrel (PLAVIX) 75 MG tablet [id number] Yes Take 1 tablet (75 mg total) by mouth daily. Minna Merritts, MD Taking Active   esomeprazole (NEXIUM) 40 MG capsule [id number] Yes Take 40 mg by mouth daily at 12 noon. [provider] Taking Active   Ipratropium-Albuterol (COMBIVENT) 20-100 MCG/ACT AERS respimat [id number] No Inhale 1 puff into the lungs every 6 (six) hours as needed for wheezing.  Patient not taking: Reported on 01/12/2021   Minna Merritts, MD Not Taking Active   isosorbide mononitrate (IMDUR) 60 MG 24 hr tablet [id number] Yes Take 1.5 tablets (90 mg total) by mouth 2 (two) times daily. Minna Merritts, MD Taking Active   losartan (COZAAR) 100 MG tablet [id number] Yes Take 1 tablet (100 mg total) by mouth daily. Minna Merritts, MD Taking Active   nitroGLYCERIN (NITROSTAT) 0.4 MG SL tablet [id number] No PLACE 1 TABLET UNDER TONGUE EVERY 5 MIN AS NEEDED FOR CHEST PAIN IF NO RELIEF IN15 MIN CALL 911 (MAX 3 TABS)  Patient not taking: Reported on 01/12/2021   Minna Merritts, MD Not Taking Active   nystatin (MYCOSTATIN) 100000 UNIT/ML suspension [id number] No Use as directed 5 mLs (500,000 Units total) in the mouth or throat daily. To rinse mouth after using inhaler  Patient not taking: Reported on 01/12/2021   [name], MD Not Taking Active    nystatin (MYCOSTATIN) 100000 UNIT/ML suspension [id number] Yes Take 5 mLs (500,000 Units total) by mouth 4 (four) times daily. [name], MD Taking Active   Probiotic Product (PROBIOTIC PO) [id number] Yes Take 1 capsule by mouth daily. [provider] Taking Active   propranolol (INDERAL) 20 MG tablet [id number] No TAKE ONE TABLET 3 TIMES DAILY AS NEEDED FOR TACHYCARDIA  Patient not taking: Reported on 01/12/2021   Minna Merritts, MD Not Taking Active   ranolazine (RANEXA) 500 MG 12 hr tablet [id number] Yes Take 1 tablet (500 mg total) by mouth 2 (two) times daily. Minna Merritts, MD Taking Active   umeclidinium-vilanterol [facility] ELLIPTA) 62.5-25 MCG/INH AEPB [id number] No Inhale 1 puff into the lungs daily.  Patient not taking: Reported on  01/12/2021   [name], MD Not Taking Active   vitamin B-12 (CYANOCOBALAMIN) 500 MCG tablet [id number] Yes Take 500 mcg by mouth daily. [provider] Taking Active           Patient Active Problem List   Diagnosis Date Noted  . Grief 04/08/2020  . History of esophageal stricture 04/08/2020  . Leg swelling 03/09/2020  . Left-sided chest wall pain 01/10/2020  . Anorexia 01/10/2020  . B12 deficiency 01/09/2020  . Generalized weakness 07/14/2019  . Mild malnutrition ([place]) 03/25/2019  . Diastolic CHF, chronic ([place]) 03/25/2019  . Vitamin D deficiency 03/25/2019  . Melena 02/03/2018  . S/P excision of skin lesion, follow-up exam 12/26/2017  . Impingement syndrome of left shoulder region 07/04/2017  . Neck pain 07/04/2017  . Allergic rhinitis 12/31/2016  . SCC (squamous cell carcinoma), face 08/01/2016  . Osteoporosis of forearm 02/13/2016  . Dizziness and giddiness 02/13/2016  . Insomnia secondary to anxiety 07/12/2015  . Anxiety disorder 07/12/2015  . Essential (primary) hypertension 10/07/2014  . Sinus bradycardia   . Hardening of the aorta (main artery of the heart) ([place]) 06/26/2014  . History of Clostridium  difficile colitis 10/20/2013  . Personal history of other diseases of the digestive system 10/20/2013  . Low back pain 10/04/2013  . Encounter for general adult medical examination without abnormal findings 02/07/2013  . Chest pain at rest 12/26/2011  . COPD, moderate ([place]) 12/05/2011  . Presence of aortocoronary bypass graft   . Chronic kidney disease, stage III (moderate) ([place]) 09/25/2011  . Hyperlipidemia   . Hypertension   . SOB (shortness of breath)   . Coronary artery disease of native artery of native heart with stable angina pectoris ([place])   . Carotid artery disease ([place])     Immunization History  Administered Date(s) Administered  . Fluad Quad(high Dose 65+) 05/15/2019, 06/11/2020  . Influenza Split 07/02/2014, 07/08/2015, 05/20/2016  . Influenza Whole 06/11/2012  . Influenza, High Dose Seasonal PF 05/30/2018  . PFIZER(Purple Top)SARS-COV-2 Vaccination 09/23/2019, 10/14/2019, 07/20/2020  . Pneumococcal Conjugate-13 12/24/2013  . Pneumococcal Polysaccharide-23 09/16/2011, 11/09/2015  . Tdap 02/07/2013  . Zoster 07/26/2013  . Zoster Recombinat (Shingrix) 05/15/2017, 07/21/2017    Conditions to be addressed/monitored: CAD, HTN, HLD and COPD  Care Plan : PharmD - Medication Management  Updates made by De Hollingshead, RPH-CPP since 01/12/2021 12:00 AM    Problem: COPD, HTN, Angina     Long-Range Goal: Disease Progression Prevention   Start Date: 09/29/2020  This Visit's Progress: On track  Priority: High  Note:   Current Barriers:   Complex patient with multiple comorbidities including COPD, CAD, HTN, angina, CKD with a high risk of disease progression     Pharmacist Clinical Goal(s):   Over the next 90 days, patient will achieve adherence to monitoring guidelines and medication adherence to achieve therapeutic efficacy through collaboration with PharmD and provider.    Interventions:  1:1 collaboration with [name], MD regarding development and  update of comprehensive plan of care as evidenced by provider attestation and co-signature  Inter-disciplinary care team collaboration (see longitudinal plan of care)  Comprehensive medication review performed; medication list updated in electronic medical record   Health Maintenance:  Confirmed that [facility] still provides medications to the patient in adherence packaging.  Due for shingles vaccination. Will discuss moving forward.    CAD, HTN, angina:  Moderately well controlled; current treatment: HTN: amlodipine 2.5 mg daily, losartan 100 mg daily  Denies recent  home BP monitoring. Last clinic reading at goal.   Antianginal regimen: ranolazine 500 mg BID, isosorbide 90 mg BID, propranolol 20 mg PRN tachycardia (has not needed lately), NTG PRN angina - reports occasional NTG use (<1 time per week, angina resolves upon first dose of nitroglycerin)  Continue current regimen at this time along with cardiology collaboration. Seek emergency medical services if angina is not resolved with 2 doses.    Hyperlipidemia and secondary ASCVD prevention  Uncontrolled but anticipated to be improved; current treatment: atorvastatin 40 mg daily   Antiplatelet regimen: clopidogrel 75 mg daily     Due for repeat lipid panel with next PCP visit to evaluate impact of atorvastatin dose increase  Recommended to continue current regimen at this time   Chronic Obstructive Pulmonary Disease:  Moderately well managed; current treatment: Anoro 62.5/25 mcg- though has cut back use; Combivent - though is not using  At our last visit, we realized patient had not been taking Anoro (she was attempting to, but dose counter was at 0). Restarted Anoro. Since then, developed complaints of thrush. Reports she is rinsing her mouth out after each administration  1 exacerbations requiring treatment in the last 6 months - upper respiratory infection treated at [facility], doxycycline script given.  Reports resolution of cough.   Last PFT: 12/08/2011 simple spirometry FEV1/FVC 54%, FEV1 1.23 L (69%); moderate obstruction per Dr. Leonidas Romberg.   Given intolerance, discontinue Anoro. Continue Combivent PRN shortness of breath, wheezing. Scheduled follow up call in ~ 4 weeks to re-evaluate breathing, need for maintenance therapy. Advised patient to contact the office to be seen by a provider face to face to evaluate "sores on tongue" if no resolution in the next 1-2 weeks  May be most appropriate to continue PRN SAMA/SABA therapy given low symptom burden at baseline. Will evaluate for manufacturer assistance   Insomnia:  Uncontrolled; notes she tried to fill lorazepam the other day and script was expired. Notes that she stopped using because it was not beneficial.   Continue to support nonpharmacologic methods for improvement in sleep.   GERD:  Controlled per patient report; current regimen: esomeprazole 40 mg daily   Recommend to continue current regimen at this time.    Patient Goals/Self-Care Activities  Over the next 90 days, patient will:  - take medications as prescribed focus on medication adherence by utilizing adherence packaging from community pharmacy check blood pressure periodocally, document, and provide at future appointments   Follow Up Plan: Telephone follow up appointment with care management team member scheduled for: ~ 4 weeks      Medication Assistance: Will evaluate moving forward  Patient's preferred pharmacy is:  [facility], [place] [facility] [address] [place] [address] Phone: [phone] Fax: [phone]   Follow Up:  Patient agrees to Care Plan and Follow-up.  Plan: Telephone follow up appointment with care management team member scheduled for:  ~ 4 weeks  Catie Darnelle Maffucci, PharmD, [place], [place] Clinical Pharmacist [employer] [facility] [employer] [phone]

## 2021-01-12 NOTE — Patient Instructions (Addendum)
It was great talking with you today!  Stop the Anoro. Just use the Combivent as needed for wheezing, shortness of breath. We will see how often you are using this and determine next steps.   Please call the office and schedule a face to face appointment with one of the providers if your mouth sores do not improve in the next 2 weeks.   Take care!  Catie Darnelle Maffucci, PharmD [phone]    Visit Information  PATIENT GOALS: Goals Addressed              This Visit's Progress     Patient Stated   .  Medication Monitoring (pt-stated)        Patient Goals/Self-Care Activities . Over the next 90 days, patient will:  - take medications as prescribed focus on medication adherence by utilizing adherence packaging from community pharmacy check blood pressure periodocally, document, and provide at future appointments       Patient verbalizes understanding of instructions provided today and agrees to view in [place].    Plan: Telephone follow up appointment with care management team member scheduled for:  ~ 4 weeks  Catie Darnelle Maffucci, PharmD, [place], [place] Clinical Pharmacist [employer] [facility] [employer] [phone]

## 2021-01-14 ENCOUNTER — Telehealth: Payer: Self-pay | Admitting: Internal Medicine

## 2021-01-14 DIAGNOSIS — H353221 Exudative age-related macular degeneration, left eye, with active choroidal neovascularization: Secondary | ICD-10-CM | POA: Diagnosis not present

## 2021-01-14 NOTE — Telephone Encounter (Signed)
LMTCB

## 2021-01-14 NOTE — Telephone Encounter (Signed)
She has multiple BP medications that she takes .  Tell her to keep the appt tomorrow with Dr Linus Orn,  But to suspend the losartan if she is scheduled to take that one tonight,  OR THE AMLODIPINE,  Whichever one she is scheduled to take

## 2021-01-14 NOTE — Telephone Encounter (Signed)
Pt called her BP is running low and has been feeling weak for the last week  Her BP today is 105/57 104/58

## 2021-01-14 NOTE — Telephone Encounter (Signed)
Spoken to patient, she stated her BP has been lower than usual, she has been having sx of fatigued and weak. She has not other sx. NO SOB, Heart palpitation, arm px, jaw px, chest px, nausea, vomiting, and blurry vision. Patient was tested for COVID twice both negative. No medications for sx. Appointment has been scheduled.

## 2021-01-15 ENCOUNTER — Other Ambulatory Visit: Payer: Self-pay

## 2021-01-15 ENCOUNTER — Ambulatory Visit (INDEPENDENT_AMBULATORY_CARE_PROVIDER_SITE_OTHER): Payer: Medicare Other | Admitting: Internal Medicine

## 2021-01-15 ENCOUNTER — Encounter: Payer: Self-pay | Admitting: Internal Medicine

## 2021-01-15 ENCOUNTER — Telehealth: Payer: Self-pay | Admitting: Internal Medicine

## 2021-01-15 VITALS — BP 118/62 | HR 85 | Temp 98.0°F | Ht 63.5 in | Wt 123.4 lb

## 2021-01-15 DIAGNOSIS — N179 Acute kidney failure, unspecified: Secondary | ICD-10-CM | POA: Diagnosis not present

## 2021-01-15 DIAGNOSIS — N184 Chronic kidney disease, stage 4 (severe): Secondary | ICD-10-CM

## 2021-01-15 DIAGNOSIS — I959 Hypotension, unspecified: Secondary | ICD-10-CM | POA: Diagnosis not present

## 2021-01-15 DIAGNOSIS — N3000 Acute cystitis without hematuria: Secondary | ICD-10-CM | POA: Diagnosis not present

## 2021-01-15 DIAGNOSIS — H6123 Impacted cerumen, bilateral: Secondary | ICD-10-CM | POA: Diagnosis not present

## 2021-01-15 DIAGNOSIS — Z1329 Encounter for screening for other suspected endocrine disorder: Secondary | ICD-10-CM | POA: Diagnosis not present

## 2021-01-15 DIAGNOSIS — E611 Iron deficiency: Secondary | ICD-10-CM

## 2021-01-15 DIAGNOSIS — R5383 Other fatigue: Secondary | ICD-10-CM | POA: Diagnosis not present

## 2021-01-15 DIAGNOSIS — N183 Chronic kidney disease, stage 3 unspecified: Secondary | ICD-10-CM | POA: Insufficient documentation

## 2021-01-15 DIAGNOSIS — N1832 Chronic kidney disease, stage 3b: Secondary | ICD-10-CM | POA: Diagnosis not present

## 2021-01-15 LAB — CBC WITH DIFFERENTIAL/PLATELET
Basophils Absolute: 0.1 10*3/uL (ref 0.0–0.1)
Basophils Relative: 0.6 % (ref 0.0–3.0)
Eosinophils Absolute: 0.2 10*3/uL (ref 0.0–0.7)
Eosinophils Relative: 2.7 % (ref 0.0–5.0)
HCT: 38.6 % (ref 36.0–46.0)
Hemoglobin: 12.8 g/dL (ref 12.0–15.0)
Lymphocytes Relative: 13.4 % (ref 12.0–46.0)
Lymphs Abs: 1.2 10*3/uL (ref 0.7–4.0)
MCHC: 33.3 g/dL (ref 30.0–36.0)
MCV: 102 fl — ABNORMAL HIGH (ref 78.0–100.0)
Monocytes Absolute: 0.9 10*3/uL (ref 0.1–1.0)
Monocytes Relative: 9.7 % (ref 3.0–12.0)
Neutro Abs: 6.9 10*3/uL (ref 1.4–7.7)
Neutrophils Relative %: 73.6 % (ref 43.0–77.0)
Platelets: 218 10*3/uL (ref 150.0–400.0)
RBC: 3.78 Mil/uL — ABNORMAL LOW (ref 3.87–5.11)
RDW: 12.1 % (ref 11.5–15.5)
WBC: 9.3 10*3/uL (ref 4.0–10.5)

## 2021-01-15 LAB — COMPREHENSIVE METABOLIC PANEL
ALT: 14 U/L (ref 0–35)
AST: 18 U/L (ref 0–37)
Albumin: 4 g/dL (ref 3.5–5.2)
Alkaline Phosphatase: 66 U/L (ref 39–117)
BUN: 30 mg/dL — ABNORMAL HIGH (ref 6–23)
CO2: 27 mEq/L (ref 19–32)
Calcium: 9.3 mg/dL (ref 8.4–10.5)
Chloride: 102 mEq/L (ref 96–112)
Creatinine, Ser: 1.72 mg/dL — ABNORMAL HIGH (ref 0.40–1.20)
GFR: 25.53 mL/min — ABNORMAL LOW (ref >60.00)
Glucose, Bld: 104 mg/dL — ABNORMAL HIGH (ref 70–99)
Potassium: 5 mEq/L (ref 3.5–5.1)
Sodium: 138 mEq/L (ref 135–145)
Total Bilirubin: 0.8 mg/dL (ref 0.2–1.2)
Total Protein: 6.5 g/dL (ref 6.0–8.3)

## 2021-01-15 LAB — TROPONIN I (HIGH SENSITIVITY): High Sens Troponin I: 12 ng/L (ref 2–17)

## 2021-01-15 LAB — TSH: TSH: 2.32 u[IU]/mL (ref 0.35–4.50)

## 2021-01-15 NOTE — Patient Instructions (Addendum)
Drink 55-64 ounces of water daily to prevent dehydration  anbesol if needed for canker sore  Stop norvasc 2.5 mg daily  Debrox ear wax drops to clean ears 1x  Per month use for 1 week to remove wax 5-10 drops in ear let sit up 5-10 minutes     Canker Sores  Canker sores are small, painful sores that develop inside your mouth. You can get one or more canker sores on the inside of your lips or cheeks, on your tongue, or anywhere inside your mouth. Canker sores cannot be passed from person to person (are not contagious). These sores are different from the sores that you may get on the outside of your lips (cold sores or fever blisters). What are the causes? The cause of this condition is not known. The condition may be passed down from a parent (genetic). What increases the risk? This condition is more likely to develop in:  Women.  People in their teens or 70s.  Women who are having their menstrual period.  People who are under a lot of emotional stress.  People who do not get enough iron or B vitamins.  People who do not take care of their mouth and teeth (have poor oral hygiene).  People who have an injury inside the mouth, such as after having dental work or from chewing something hard. What are the signs or symptoms? Canker sores usually start as painful red bumps. Then they turn into small white, yellow, or gray sores that have red borders. The sores may be painful, and the pain may get worse when you eat or drink. Along with the canker sore, symptoms may also include:  Fever.  Fatigue.  Swollen lymph nodes in your neck. How is this diagnosed? This condition may be diagnosed based on your symptoms and an exam of the inside of your mouth. If you get canker sores often or if they are very bad, you may have tests, such as:  Blood tests to rule out possible causes.  Swabbing a fluid sample from the sore to be tested for infection.  Removing a small tissue sample from the  sore (biopsy) to test it for cancer. How is this treated? Most canker sores go away without treatment in about 1 week. Home care is usually the only treatment that you will need. Over-the-counter medicines can relieve discomfort. If you have severe canker sores, your health care provider may prescribe:  Numbing ointment to relieve pain. ? Do not use numbing gels or products containing benzocaine in children who are 22 years of age or younger.  Vitamins.  Steroid medicines. These may be given as pills, mouth rinses, or gels.  Antibiotic mouth rinse. Follow these instructions at home:  Apply, take, or use over-the-counter and prescription medicines only as told by your health care provider. These include vitamins and ointments.  If you were prescribed an antibiotic mouth rinse, use it as told by your health care provider. Do not stop using the antibiotic even if your condition improves.  Until the sores are healed: ? Do not drink coffee or citrus juices. ? Do not eat spicy or salty foods.  Use a mild, over-the-counter mouth rinse as recommended by your health care provider.  Practice good oral hygiene by: ? Flossing your teeth every day. ? Brushing your teeth with a soft toothbrush twice each day.   Contact a health care provider if:  Your symptoms do not get better after 2 weeks.  You also have  a fever or swollen glands in your neck.  You get canker sores often.  You have a canker sore that is getting larger.  You cannot eat or drink due to your canker sores. Summary  Canker sores are small, painful sores that develop inside your mouth.  Canker sores usually start as painful red bumps that turn into small white, yellow, or gray sores that have red borders.  The sores may be quite painful, and the pain may get worse when you eat or drink.  Most canker sores clear up without treatment in about 1 week. Over-the-counter medicines can relieve discomfort. This information is  not intended to replace advice given to you by your health care provider. Make sure you discuss any questions you have with your health care provider. Document Revised: 05/29/2020 Document Reviewed: 05/29/2020 Elsevier Patient Education  2021 [place].  Weakness Weakness is a lack of strength. You may feel weak all over your body (generalized), or you may feel weak in one specific part of your body (focal). Common causes of weakness include:  Infection and immune system disorders.  Physical exhaustion.  Internal bleeding or other blood loss that results in a lack of red blood cells (anemia).  Dehydration.  An imbalance in mineral (electrolyte) levels, such as potassium.  Heart disease, circulation problems, or stroke. Other causes include:  Some medicines or cancer treatment.  Stress, anxiety, or depression.  Nervous system disorders.  Thyroid disorders.  Loss of muscle strength because of age or inactivity.  Poor sleep quality or sleep disorders. The cause of your weakness may not be known. Some causes of weakness can be serious, so it is important to see your health care provider. Follow these instructions at home: Activity  Rest as needed.  Try to get enough sleep. Most adults need 7-8 hours of quality sleep each night. Talk to your health care provider about how much sleep you need each night.  Do exercises, such as arm curls and leg raises, for 30 minutes at least 2 days a week or as told by your health care provider. This helps build muscle strength.  Consider working with a physical therapist or trainer who can develop an exercise plan to help you gain muscle strength. General instructions  Take over-the-counter and prescription medicines only as told by your health care provider.  Eat a healthy, well-balanced diet. This includes: ? Proteins to build muscles, such as lean meats and fish. ? Fresh fruits and vegetables. ? Carbohydrates to boost energy, such  as whole grains.  Drink enough fluid to keep your urine pale yellow.  Keep all follow-up visits as told by your health care provider. This is important.   Contact a health care provider if your weakness:  Does not improve or gets worse.  Affects your ability to think clearly.  Affects your ability to do your normal daily activities. Get help right away if you:  Develop sudden weakness, especially on one side of your face or body.  Have chest pain.  Have trouble breathing or shortness of breath.  Have problems with your vision.  Have trouble talking or swallowing.  Have trouble standing or walking.  Are light-headed or lose consciousness. Summary  Weakness is a lack of strength. You may feel weak all over your body or just in one specific part of your body.  Weakness can be caused by a variety of things. In some cases, the cause may be unknown.  Rest as needed, and try to get  enough sleep. Most adults need 7-8 hours of quality sleep each night.  Eat a healthy, well-balanced diet. This information is not intended to replace advice given to you by your health care provider. Make sure you discuss any questions you have with your health care provider. Document Revised: 04/11/2018 Document Reviewed: 04/11/2018 Elsevier Patient Education  2021 [place].  Fatigue If you have fatigue, you feel tired all the time and have a lack of energy or a lack of motivation. Fatigue may make it difficult to start or complete tasks because of exhaustion. In general, occasional or mild fatigue is often a normal response to activity or life. However, long-lasting (chronic) or extreme fatigue may be a symptom of a medical condition. Follow these instructions at home: General instructions  Watch your fatigue for any changes.  Go to bed and get up at the same time every day.  Avoid fatigue by pacing yourself during the day and getting enough sleep at night.  Maintain a healthy  weight. Medicines  Take over-the-counter and prescription medicines only as told by your health care provider.  Take a multivitamin, if told by your health care provider.  Do not use herbal or dietary supplements unless they are approved by your health care provider. Activity  Exercise regularly, as told by your health care provider.  Use or practice techniques to help you relax, such as yoga, tai chi, meditation, or massage therapy.   Eating and drinking  Avoid heavy meals in the evening.  Eat a well-balanced diet, which includes lean proteins, whole grains, plenty of fruits and vegetables, and low-fat dairy products.  Avoid consuming too much caffeine.  Avoid the use of alcohol.  Drink enough fluid to keep your urine pale yellow.   Lifestyle  Change situations that cause you stress. Try to keep your work and personal schedule in balance.  Do not use any products that contain nicotine or tobacco, such as cigarettes and e-cigarettes. If you need help quitting, ask your health care provider.  Do not use drugs. Contact a health care provider if:  Your fatigue does not get better.  You have a fever.  You suddenly lose or gain weight.  You have headaches.  You have trouble falling asleep or sleeping through the night.  You feel angry, guilty, anxious, or sad.  You are unable to have a bowel movement (constipation).  Your skin is dry.  You have swelling in your legs or another part of your body. Get help right away if:  You feel confused.  Your vision is blurry.  You feel faint or you pass out.  You have a severe headache.  You have severe pain in your abdomen, your back, or the area between your waist and hips (pelvis).  You have chest pain, shortness of breath, or an irregular or fast heartbeat.  You are unable to urinate, or you urinate less than normal.  You have abnormal bleeding, such as bleeding from the rectum, vagina, nose, lungs, or  nipples.  You vomit blood.  You have thoughts about hurting yourself or others. If you ever feel like you may hurt yourself or others, or have thoughts about taking your own life, get help right away. You can go to your nearest emergency department or call:  Your local emergency services (911 in the [place]  A suicide crisis helpline, such as the [place] at 626-653-8163. This is open 24 hours a day. Summary  If you have fatigue, you feel  tired all the time and have a lack of energy or a lack of motivation.  Fatigue may make it difficult to start or complete tasks because of exhaustion.  Long-lasting (chronic) or extreme fatigue may be a symptom of a medical condition.  Exercise regularly, as told by your health care provider.  Change situations that cause you stress. Try to keep your work and personal schedule in balance. This information is not intended to replace advice given to you by your health care provider. Make sure you discuss any questions you have with your health care provider. Document Revised: 03/27/2019 Document Reviewed: 05/31/2017 Elsevier Patient Education  2021 [employer].

## 2021-01-15 NOTE — Progress Notes (Addendum)
Chief Complaint  Patient presents with  . Hypotension   F/u  1. Hypotension, fatigue and weakness x 5-6 days her bedroom is upstairs and has noticed this but no falls BP low normal 104-110/56-65 at home on norvasc 2.5 mg qd and losartan 100 mg qd and imdur 60 mg qd will stop norvasc 2.5 mg qd, ranexa 500 mg bid 2 covid tests negative and wearing mask at home 2. B/l cerumen impaction with hearing loss R>L not wearing hearing aids today and aids not working as well    Review of Systems  Constitutional: Positive for malaise/fatigue.  HENT: Positive for hearing loss.   Eyes: Negative for blurred vision.  Respiratory: Negative for cough and shortness of breath.   Gastrointestinal: Negative for blood in stool.  Genitourinary: Negative for dysuria.  Musculoskeletal: Negative for falls.  Skin: Negative for rash.  Neurological: Positive for weakness.   Past Medical History:  Diagnosis Date  . C. difficile colitis   . CAD (coronary artery disease)    a. s/p CABG 1999; b. Nuclear 10/11, no scar or ischemia, EF 68%; b. [place] 06/18/14: no evidence of infarction or ischemia, EF 77%, low risk study; c. 02/2019 [place]: EF 56%, ? mild lat ischemia->low risk.  . Carotid artery disease ([place])    a. Doppler January, 2012, 40-59% bilateral; b. 02/2017 [place]: 1-39% bilat ICA stenosis.  . Closed fracture pubis ([place]) 07/04/2017  . COPD (chronic obstructive pulmonary disease) ([place])   . Diffuse cystic mastopathy   . Diverticulitis    last colonoscopy  incomplete Jan 2011  . Dizziness    stable now (Oct 2011) patient tells me question of TIA, we will obtain records  . Gait abnormality    Patient complains of "wobbly gait", December, 2013  . GERD (gastroesophageal reflux disease)   . History of migraines   . Hx of CABG    a. 3 vessel in 1999  . Hyperlipidemia   . Hypertension   . Indigestion    3 weeks, October 2011  . Kidney stones   . Rheumatic fever   . Sinus bradycardia    Mild, December,  2013  . SOB (shortness of breath)   . Syncopal episodes    after 18 holes of golf and increase hydrochlorothiazide, resolved    Past Surgical History:  Procedure Laterality Date  . APPENDECTOMY  1950  . BREAST BIOPSY Right 1994  . BREAST CYST ASPIRATION     multiple BIL  . CATARACT EXTRACTION Right 2009  . CATARACT EXTRACTION Left 2011  . COLONOSCOPY  [id number]   Dr. Jamal Collin  . CORONARY ARTERY BYPASS GRAFT  1999  . esophagus stretched    . TONSILLECTOMY  1939  . TOTAL ABDOMINAL HYSTERECTOMY  1968   due to metrorrhagia   Family History  Problem Relation Age of Onset  . Heart disease Mother   . Heart disease Father   . Cancer Neg Hx   . Drug abuse Neg Hx   . Breast cancer Neg Hx    Social History   Socioeconomic History  . Marital status: Widowed    Spouse name: Not on file  . Number of children: 2  . Years of education: Not on file  . Highest education level: Not on file  Occupational History    Employer: RETIRED  Tobacco Use  . Smoking status: Former Smoker    Packs/day: 0.50    Years: 40.00    Pack years: 20.00    Types: Cigarettes  Quit date: 09/20/1983    Years since quitting: 37.3  . Smokeless tobacco: Never Used  Vaping Use  . Vaping Use: Never used  Substance and Sexual Activity  . Alcohol use: Not Currently    Comment: yes, social wine  . Drug use: No  . Sexual activity: Never  Other Topics Concern  . Not on file  Social History Narrative   Lives by herself, ambulates independently at baseline   Social Determinants of Health   Financial Resource Strain: Medium Risk  . Difficulty of Paying Living Expenses: Somewhat hard  Food Insecurity: No Food Insecurity  . Worried About Charity fundraiser in the Last Year: Never true  . Ran Out of Food in the Last Year: Never true  Transportation Needs: No Transportation Needs  . Lack of Transportation (Medical): No  . Lack of Transportation (Non-Medical): No  Physical Activity: Not on file  Stress: Not  on file  Social Connections: Unknown  . Frequency of Communication with Friends and Family: Once a week  . Frequency of Social Gatherings with Friends and Family: Not on file  . Attends Religious Services: Not on file  . Active Member of Clubs or Organizations: Not on file  . Attends Archivist Meetings: Not on file  . Marital Status: Not on file  Intimate Partner Violence: Not At Risk  . Fear of Current or Ex-Partner: No  . Emotionally Abused: No  . Physically Abused: No  . Sexually Abused: No   Current Meds  Medication Sig  . atorvastatin (LIPITOR) 40 MG tablet TAKE ONE TABLET BY MOUTH EVERY DAY  . clopidogrel (PLAVIX) 75 MG tablet Take 1 tablet (75 mg total) by mouth daily.  Marland Kitchen esomeprazole (NEXIUM) 40 MG capsule Take 40 mg by mouth daily at 12 noon.  . Ipratropium-Albuterol (COMBIVENT) 20-100 MCG/ACT AERS respimat Inhale 1 puff into the lungs every 6 (six) hours as needed for wheezing.  . isosorbide mononitrate (IMDUR) 60 MG 24 hr tablet Take 1.5 tablets (90 mg total) by mouth 2 (two) times daily.  Marland Kitchen losartan (COZAAR) 100 MG tablet Take 1 tablet (100 mg total) by mouth daily.  . nitroGLYCERIN (NITROSTAT) 0.4 MG SL tablet PLACE 1 TABLET UNDER TONGUE EVERY 5 MIN AS NEEDED FOR CHEST PAIN IF NO RELIEF IN15 MIN CALL 911 (MAX 3 TABS)  . Probiotic Product (PROBIOTIC PO) Take 1 capsule by mouth daily.  . propranolol (INDERAL) 20 MG tablet TAKE ONE TABLET 3 TIMES DAILY AS NEEDED FOR TACHYCARDIA  . ranolazine (RANEXA) 500 MG 12 hr tablet Take 1 tablet (500 mg total) by mouth 2 (two) times daily.  . vitamin B-12 (CYANOCOBALAMIN) 500 MCG tablet Take 500 mcg by mouth daily.  . [DISCONTINUED] amLODipine (NORVASC) 2.5 MG tablet TAKE ONE TABLET EVERY DAY   Allergies  Allergen Reactions  . Ciprofloxacin     ? Caused c diff    Recent Results (from the past 2160 hour(s))  Comprehensive metabolic panel     Status: Abnormal   Collection Time: 12/11/20  2:18 PM  Result Value Ref Range    Sodium 141 135 - 145 mEq/L   Potassium 4.2 3.5 - 5.1 mEq/L   Chloride 106 96 - 112 mEq/L   CO2 24 19 - 32 mEq/L   Glucose, Bld 106 (H) 70 - 99 mg/dL   BUN 22 6 - 23 mg/dL   Creatinine, Ser 1.42 (H) 0.40 - 1.20 mg/dL   Total Bilirubin 0.6 0.2 - 1.2 mg/dL   Alkaline  Phosphatase 71 39 - 117 U/L   AST 11 0 - 37 U/L   ALT 10 0 - 35 U/L   Total Protein 6.1 6.0 - 8.3 g/dL   Albumin 4.3 3.5 - 5.2 g/dL   GFR 32.15 (L) >60.00 mL/min    Comment: Calculated using the CKD-EPI Creatinine Equation (2021)   Calcium 8.7 8.4 - 10.5 mg/dL  Vitamin B12     Status: Abnormal   Collection Time: 12/11/20  2:18 PM  Result Value Ref Range   Vitamin B-12 1,084 (H) 211 - 911 pg/mL  VITAMIN D 25 Hydroxy (Vit-D Deficiency, Fractures)     Status: None   Collection Time: 12/11/20  2:18 PM  Result Value Ref Range   VITD 32.35 30.00 - 100.00 ng/mL   Objective  Body mass index is 21.52 kg/m. Wt Readings from Last 3 Encounters:  01/15/21 123 lb 6.4 oz (56 kg)  12/11/20 128 lb (58.1 kg)  10/12/20 126 lb (57.2 kg)   Temp Readings from Last 3 Encounters:  01/15/21 98 F (36.7 C) (Oral)  12/11/20 98 F (36.7 C) (Oral)  06/11/20 98.2 F (36.8 C)   BP Readings from Last 3 Encounters:  01/15/21 118/62  12/11/20 114/68  10/12/20 138/62   Pulse Readings from Last 3 Encounters:  01/15/21 85  12/11/20 70  10/12/20 69    Physical Exam Vitals and nursing note reviewed.  Constitutional:      Appearance: Normal appearance. She is well-developed and well-groomed.  HENT:     Head: Normocephalic and atraumatic.     Right Ear: There is impacted cerumen.     Left Ear: There is impacted cerumen.  Eyes:     Conjunctiva/sclera: Conjunctivae normal.     Pupils: Pupils are equal, round, and reactive to light.  Cardiovascular:     Rate and Rhythm: Normal rate and regular rhythm.     Heart sounds: Normal heart sounds. No murmur heard.   Abdominal:     General: Abdomen is flat. Bowel sounds are normal.   Skin:    General: Skin is warm and dry.     Comments: Some skin tenting hands/arms mild   Neurological:     General: No focal deficit present.     Mental Status: She is alert and oriented to person, place, and time.     Gait: Gait normal.  Psychiatric:        Attention and Perception: Attention and perception normal.        Mood and Affect: Mood and affect normal.        Speech: Speech normal.        Behavior: Behavior normal. Behavior is cooperative.        Thought Content: Thought content normal.        Cognition and Memory: Cognition and memory normal.        Judgment: Judgment normal.     Assessment  Plan  Hypotension, fatigue r/o anemia/UTI/cardiac etiology 2 home covid tests negative h/o htn on losartan 100mg  qd and norvasc 2.5 mg qd- Check with cards if should be on ranexa and imdur?  Plan: CBC with Differential/Platelet, Urinalysis, Routine w reflex microscopic, Urine Culture, TSH, Comprehensive metabolic panel, Troponin I  Stop norvasc 2.5 will CC PCP and cards Dr. Rockey Situ  Bilateral hearing loss due to cerumen impaction  Normally wears hearing aids  Consented and tolerated ear currette all wax removed left and more wax in right ear removed some with currette and rest with lavage  All wax removed   AKI on CKD3B vs early CKD 4 Hydration 55-64 ounces water daily   Referred Dr. Candiss Norse renal initial consult  Provider: Dr. Olivia Mackie [facility]

## 2021-01-15 NOTE — Telephone Encounter (Signed)
Pt never returned phone message but saw Dr. Olivia Mackie today.

## 2021-01-15 NOTE — Addendum Note (Signed)
Addended by: Orland Mustard on: 01/15/2021 05:26 PM   Modules accepted: Orders

## 2021-01-15 NOTE — Telephone Encounter (Signed)
Check with cards if should be on ranexa and imdur?

## 2021-01-16 LAB — MICROSCOPIC EXAMINATION
Bacteria, UA: NONE SEEN
Casts: NONE SEEN /lpf

## 2021-01-16 LAB — URINALYSIS, ROUTINE W REFLEX MICROSCOPIC
Bilirubin, UA: NEGATIVE
Glucose, UA: NEGATIVE
Nitrite, UA: NEGATIVE
RBC, UA: NEGATIVE
Specific Gravity, UA: 1.026 (ref 1.005–1.030)
Urobilinogen, Ur: 1 mg/dL (ref 0.2–1.0)
pH, UA: 5 (ref 5.0–7.5)

## 2021-01-16 LAB — IRON,TIBC AND FERRITIN PANEL
%SAT: 51 % (calc) — ABNORMAL HIGH (ref 16–45)
Ferritin: 178 ng/mL (ref 16–288)
Iron: 120 ug/dL (ref 45–160)
TIBC: 234 mcg/dL (calc) — ABNORMAL LOW (ref 250–450)

## 2021-01-18 LAB — URINE CULTURE

## 2021-01-20 ENCOUNTER — Other Ambulatory Visit: Payer: Self-pay

## 2021-01-20 ENCOUNTER — Emergency Department: Payer: Medicare Other

## 2021-01-20 ENCOUNTER — Emergency Department
Admission: EM | Admit: 2021-01-20 | Discharge: 2021-01-20 | Disposition: A | Discharge status: Home / Self Care | Payer: Medicare Other | Attending: Emergency Medicine | Admitting: Emergency Medicine

## 2021-01-20 DIAGNOSIS — Z79899 Other long term (current) drug therapy: Secondary | ICD-10-CM | POA: Diagnosis not present

## 2021-01-20 DIAGNOSIS — I25119 Atherosclerotic heart disease of native coronary artery with unspecified angina pectoris: Secondary | ICD-10-CM | POA: Insufficient documentation

## 2021-01-20 DIAGNOSIS — R531 Weakness: Secondary | ICD-10-CM | POA: Diagnosis not present

## 2021-01-20 DIAGNOSIS — Z951 Presence of aortocoronary bypass graft: Secondary | ICD-10-CM | POA: Insufficient documentation

## 2021-01-20 DIAGNOSIS — Z87891 Personal history of nicotine dependence: Secondary | ICD-10-CM | POA: Diagnosis not present

## 2021-01-20 DIAGNOSIS — Z7902 Long term (current) use of antithrombotics/antiplatelets: Secondary | ICD-10-CM | POA: Diagnosis not present

## 2021-01-20 DIAGNOSIS — I10 Essential (primary) hypertension: Secondary | ICD-10-CM | POA: Diagnosis not present

## 2021-01-20 DIAGNOSIS — K449 Diaphragmatic hernia without obstruction or gangrene: Secondary | ICD-10-CM | POA: Diagnosis not present

## 2021-01-20 DIAGNOSIS — N184 Chronic kidney disease, stage 4 (severe): Secondary | ICD-10-CM | POA: Diagnosis not present

## 2021-01-20 DIAGNOSIS — I5032 Chronic diastolic (congestive) heart failure: Secondary | ICD-10-CM | POA: Diagnosis not present

## 2021-01-20 DIAGNOSIS — E860 Dehydration: Secondary | ICD-10-CM | POA: Diagnosis not present

## 2021-01-20 DIAGNOSIS — J449 Chronic obstructive pulmonary disease, unspecified: Secondary | ICD-10-CM | POA: Diagnosis not present

## 2021-01-20 DIAGNOSIS — I130 Hypertensive heart and chronic kidney disease with heart failure and stage 1 through stage 4 chronic kidney disease, or unspecified chronic kidney disease: Secondary | ICD-10-CM | POA: Insufficient documentation

## 2021-01-20 LAB — URINALYSIS, COMPLETE (UACMP) WITH MICROSCOPIC
Bacteria, UA: NONE SEEN
Bilirubin Urine: NEGATIVE
Glucose, UA: NEGATIVE mg/dL
Hgb urine dipstick: NEGATIVE
Ketones, ur: NEGATIVE mg/dL
Nitrite: NEGATIVE
Protein, ur: NEGATIVE mg/dL
Specific Gravity, Urine: 1.013 (ref 1.005–1.030)
pH: 7 (ref 5.0–8.0)

## 2021-01-20 LAB — CBC
HCT: 38.6 % (ref 36.0–46.0)
Hemoglobin: 12.7 g/dL (ref 12.0–15.0)
MCH: 33.8 pg (ref 26.0–34.0)
MCHC: 32.9 g/dL (ref 30.0–36.0)
MCV: 102.7 fL — ABNORMAL HIGH (ref 80.0–100.0)
Platelets: 208 10*3/uL (ref 150–400)
RBC: 3.76 MIL/uL — ABNORMAL LOW (ref 3.87–5.11)
RDW: 11.3 % — ABNORMAL LOW (ref 11.5–15.5)
WBC: 6.8 10*3/uL (ref 4.0–10.5)
nRBC: 0 % (ref 0.0–0.2)

## 2021-01-20 LAB — CBG MONITORING, ED: Glucose-Capillary: 110 mg/dL — ABNORMAL HIGH (ref 70–99)

## 2021-01-20 LAB — BASIC METABOLIC PANEL
Anion gap: 9 (ref 5–15)
BUN: 21 mg/dL (ref 8–23)
CO2: 26 mmol/L (ref 22–32)
Calcium: 9.1 mg/dL (ref 8.9–10.3)
Chloride: 101 mmol/L (ref 98–111)
Creatinine, Ser: 1.47 mg/dL — ABNORMAL HIGH (ref 0.44–1.00)
GFR, Estimated: 33 mL/min — ABNORMAL LOW (ref >60)
Glucose, Bld: 111 mg/dL — ABNORMAL HIGH (ref 70–99)
Potassium: 5.2 mmol/L — ABNORMAL HIGH (ref 3.5–5.1)
Sodium: 136 mmol/L (ref 135–145)

## 2021-01-20 LAB — TROPONIN I (HIGH SENSITIVITY): Troponin I (High Sensitivity): 10 ng/L (ref <18)

## 2021-01-20 MED ORDER — LACTATED RINGERS IV BOLUS
1000.0000 mL | Freq: Once | INTRAVENOUS | Status: AC
  Administered 2021-01-20: 1000 mL via INTRAVENOUS

## 2021-01-20 NOTE — ED Provider Notes (Signed)
[facility] Emergency Department Provider Note  ____________________________________________   Event Date/Time   First MD Initiated Contact with Patient 01/20/21 1246     (approximate)  I have reviewed the triage vital signs and the nursing notes.   HISTORY  Chief Complaint Weakness    HPI [name] is a 87 y.o. female  Here with generalized weakness. Pt reports that she has been generally weak for the past several days. Pt has a h/o prior similar s xwith dehydration and thinks she has not been eating/drinking as much as usual. She states that she has just felt "very tired" over last several days. She saw her PCP for this and was told that her labs showed her kidney numbers were abnormal and that she needed to f/u with a nephrologist. Reports that when she awoke today and did not feel much better, she decided to present for further evaluation. Denies any pain> no CP, SOb. No abd pain, dysuria, hematuria. Reports poor appetite chronically but this is not new. No constipation or diarrhea. No other complaints. No recent med changes. No known recent sick contacts.        Past Medical History:  Diagnosis Date  . C. difficile colitis   . CAD (coronary artery disease)    a. s/p CABG 1999; b. Nuclear 10/11, no scar or ischemia, EF 68%; b. [place] 06/18/14: no evidence of infarction or ischemia, EF 77%, low risk study; c. 02/2019 MV: EF 56%, ? mild lat ischemia->low risk.  . Carotid artery disease ([place])    a. Doppler January, 2012, 40-59% bilateral; b. 02/2017 U/S: 1-39% bilat ICA stenosis.  . Closed fracture pubis ([place]) 07/04/2017  . COPD (chronic obstructive pulmonary disease) ([place])   . Diffuse cystic mastopathy   . Diverticulitis    last colonoscopy  incomplete Jan 2011  . Dizziness    stable now (Oct 2011) patient tells me question of TIA, we will obtain records  . Gait abnormality    Patient complains of "wobbly gait", December, 2013  . GERD  (gastroesophageal reflux disease)   . History of migraines   . Hx of CABG    a. 3 vessel in 1999  . Hyperlipidemia   . Hypertension   . Indigestion    3 weeks, October 2011  . Kidney stones   . Rheumatic fever   . Sinus bradycardia    Mild, December, 2013  . SOB (shortness of breath)   . Syncopal episodes    after 18 holes of golf and increase hydrochlorothiazide, resolved     Patient Active Problem List   Diagnosis Date Noted  . CKD (chronic kidney disease) stage 4, GFR 15-29 ml/min (HCC) 01/15/2021  . Grief 04/08/2020  . History of esophageal stricture 04/08/2020  . Leg swelling 03/09/2020  . Left-sided chest wall pain 01/10/2020  . Anorexia 01/10/2020  . B12 deficiency 01/09/2020  . Generalized weakness 07/14/2019  . Mild malnutrition ([place]) 03/25/2019  . Diastolic CHF, chronic ([place]) 03/25/2019  . Vitamin D deficiency 03/25/2019  . Melena 02/03/2018  . S/P excision of skin lesion, follow-up exam 12/26/2017  . Impingement syndrome of left shoulder region 07/04/2017  . Neck pain 07/04/2017  . Allergic rhinitis 12/31/2016  . SCC (squamous cell carcinoma), face 08/01/2016  . Osteoporosis of forearm 02/13/2016  . Dizziness and giddiness 02/13/2016  . Insomnia secondary to anxiety 07/12/2015  . Anxiety disorder 07/12/2015  . Essential (primary) hypertension 10/07/2014  . Sinus bradycardia   . Hardening of  the aorta (main artery of the heart) ([place]) 06/26/2014  . History of Clostridium difficile colitis 10/20/2013  . Personal history of other diseases of the digestive system 10/20/2013  . Low back pain 10/04/2013  . Encounter for general adult medical examination without abnormal findings 02/07/2013  . Chest pain at rest 12/26/2011  . COPD, moderate ([place]) 12/05/2011  . Presence of aortocoronary bypass graft   . Stage 3b chronic kidney disease ([place]) 09/25/2011  . Hyperlipidemia   . Hypertension   . SOB (shortness of breath)   . Coronary artery disease of native artery  of native heart with stable angina pectoris ([place])   . Carotid artery disease [facility])     Past Surgical History:  Procedure Laterality Date  . APPENDECTOMY  1950  . BREAST BIOPSY Right 1994  . BREAST CYST ASPIRATION     multiple BIL  . CATARACT EXTRACTION Right 2009  . CATARACT EXTRACTION Left 2011  . COLONOSCOPY  [id number]   Dr. Jamal Collin  . CORONARY ARTERY BYPASS GRAFT  1999  . esophagus stretched    . TONSILLECTOMY  1939  . TOTAL ABDOMINAL HYSTERECTOMY  1968   due to metrorrhagia    Prior to Admission medications   Medication Sig Start Date End Date Taking? Authorizing Provider  atorvastatin (LIPITOR) 40 MG tablet TAKE ONE TABLET BY MOUTH EVERY DAY 11/09/20  Yes [name], MD  clopidogrel (PLAVIX) 75 MG tablet Take 1 tablet (75 mg total) by mouth daily. 04/07/20  Yes Minna Merritts, MD  esomeprazole (NEXIUM) 40 MG capsule Take 40 mg by mouth daily at 12 noon.   Yes [provider]  Ipratropium-Albuterol (COMBIVENT) 20-100 MCG/ACT AERS respimat Inhale 1 puff into the lungs every 6 (six) hours as needed for wheezing. 02/05/20  Yes Minna Merritts, MD  isosorbide mononitrate (IMDUR) 60 MG 24 hr tablet Take 1.5 tablets (90 mg total) by mouth 2 (two) times daily. 04/07/20  Yes Minna Merritts, MD  losartan (COZAAR) 100 MG tablet Take 1 tablet (100 mg total) by mouth daily. 04/07/20  Yes Gollan, Kathlene November, MD  nitroGLYCERIN (NITROSTAT) 0.4 MG SL tablet PLACE 1 TABLET UNDER TONGUE EVERY 5 MIN AS NEEDED FOR CHEST PAIN IF NO RELIEF IN15 MIN CALL 911 (MAX 3 TABS) 04/07/20  Yes Gollan, Kathlene November, MD  propranolol (INDERAL) 20 MG tablet TAKE ONE TABLET 3 TIMES DAILY AS NEEDED FOR TACHYCARDIA 04/07/20  Yes Gollan, Kathlene November, MD  ranolazine (RANEXA) 500 MG 12 hr tablet Take 1 tablet (500 mg total) by mouth 2 (two) times daily. 04/07/20  Yes Gollan, Kathlene November, MD  vitamin B-12 (CYANOCOBALAMIN) 500 MCG tablet Take 500 mcg by mouth daily.   Yes [provider]  Probiotic Product  (PROBIOTIC PO) Take 1 capsule by mouth daily.    [provider]    Allergies Ciprofloxacin  Family History  Problem Relation Age of Onset  . Heart disease Mother   . Heart disease Father   . Cancer Neg Hx   . Drug abuse Neg Hx   . Breast cancer Neg Hx     Social History Social History   Tobacco Use  . Smoking status: Former Smoker    Packs/day: 0.50    Years: 40.00    Pack years: 20.00    Types: Cigarettes    Quit date: 09/20/1983    Years since quitting: 37.3  . Smokeless tobacco: Never Used  Vaping Use  . Vaping Use: Never used  Substance Use Topics  .  Alcohol use: Not Currently    Comment: yes, social wine  . Drug use: No    Review of Systems  Review of Systems  Constitutional: Positive for fatigue. Negative for chills and fever.  HENT: Negative for sore throat.   Respiratory: Negative for shortness of breath.   Cardiovascular: Negative for chest pain.  Gastrointestinal: Negative for abdominal pain.  Genitourinary: Negative for flank pain.  Musculoskeletal: Negative for neck pain.  Skin: Negative for rash and wound.  Allergic/Immunologic: Negative for immunocompromised state.  Neurological: Negative for weakness and numbness.  Hematological: Does not bruise/bleed easily.  All other systems reviewed and are negative.    ____________________________________________  PHYSICAL EXAM:      VITAL SIGNS: ED Triage Vitals  Enc Vitals Group     BP 01/20/21 1249 (!) 152/66     Pulse Rate 01/20/21 1249 72     Resp 01/20/21 1249 16     Temp 01/20/21 1249 97.8 F (36.6 C)     Temp Source 01/20/21 1249 Oral     SpO2 01/20/21 1249 96 %     Weight 01/20/21 1250 123 lb (55.8 kg)     Height 01/20/21 1250 5' 3.5" (1.613 m)     Head Circumference --      Peak Flow --      Pain Score 01/20/21 1250 0     Pain Loc --      Pain Edu? --      Excl. in [place]? --      Physical Exam Vitals and nursing note reviewed.  Constitutional:      General: She is not in  acute distress.    Appearance: She is well-developed.  HENT:     Head: Normocephalic and atraumatic.     Mouth/Throat:     Mouth: Mucous membranes are dry.  Eyes:     Conjunctiva/sclera: Conjunctivae normal.  Cardiovascular:     Rate and Rhythm: Normal rate and regular rhythm.     Heart sounds: Normal heart sounds. No murmur heard. No friction rub.  Pulmonary:     Effort: Pulmonary effort is normal. No respiratory distress.     Breath sounds: Normal breath sounds. No wheezing or rales.  Abdominal:     General: There is no distension.     Palpations: Abdomen is soft.     Tenderness: There is no abdominal tenderness.  Musculoskeletal:     Cervical back: Neck supple.  Skin:    General: Skin is warm.     Capillary Refill: Capillary refill takes less than 2 seconds.  Neurological:     Mental Status: She is alert and oriented to person, place, and time.     Motor: No abnormal muscle tone.       ____________________________________________   LABS (all labs ordered are listed, but only abnormal results are displayed)  Labs Reviewed  BASIC METABOLIC PANEL - Abnormal; Notable for the following components:      Result Value   Potassium 5.2 (*)    Glucose, Bld 111 (*)    Creatinine, Ser 1.47 (*)    GFR, Estimated 33 (*)    All other components within normal limits  CBC - Abnormal; Notable for the following components:   RBC 3.76 (*)    MCV 102.7 (*)    RDW 11.3 (*)    All other components within normal limits  URINALYSIS, COMPLETE (UACMP) WITH MICROSCOPIC - Abnormal; Notable for the following components:   Color, Urine YELLOW (*)  APPearance CLEAR (*)    Leukocytes,Ua TRACE (*)    All other components within normal limits  CBG MONITORING, ED - Abnormal; Notable for the following components:   Glucose-Capillary 110 (*)    All other components within normal limits  TROPONIN I (HIGH SENSITIVITY)    ____________________________________________  EKG: Normal sinus  rhythm, ventricular rate 69.  PR 130, QRS 74, QTc 443.  Rightward axis.  No acute ST elevations or depressions.  No acute evidence of acute ischemia or infarct.  No acute changes compared to prior. ________________________________________  RADIOLOGY All imaging, including plain films, CT scans, and ultrasounds, independently reviewed by me, and interpretations confirmed via formal radiology reads.  ED MD interpretation:   CXR: Clear  Official radiology report(s): DG Chest 2 View  Result Date: 01/20/2021 CLINICAL DATA:  Generalized weakness in a 87 year old female. EXAM: CHEST - 2 VIEW COMPARISON:  December 30, 2019 FINDINGS: Trachea midline. Cardiomediastinal contours are stable with signs of median sternotomy. Some added density in the retrocardiac region compatible with known hiatal hernia and extending to the RIGHT of the spine as on previous chest imaging and abdominal CT. Biapical scarring similar to the prior study. No lobar consolidation. No pleural effusion. Hyperinflation as before. EKG leads project over the chest. IMPRESSION: 1. No acute cardiopulmonary disease. 2. Postoperative changes of median sternotomy for CABG as before. 3. Hiatal hernia. Electronically Signed   By: Zetta Bills M.D.   On: 01/20/2021 14:52    ____________________________________________  PROCEDURES   Procedure(s) performed (including Critical Care):  .1-3 Lead EKG Interpretation Performed by: Duffy Bruce, MD Authorized by: Duffy Bruce, MD     Interpretation: normal     ECG rate:  60-80   ECG rate assessment: normal     Rhythm: sinus rhythm     Ectopy: none     Conduction: normal   Comments:     Indication: weakness    ____________________________________________  INITIAL IMPRESSION / MDM / [place] / ED COURSE  As part of my medical decision making, I reviewed the following data within the [place] notes reviewed and incorporated, Old chart  reviewed, Notes from prior ED visits, and [place] Controlled Substance Database       *KELLIS TOPETE was evaluated in Emergency Department on 01/20/2021 for the symptoms described in the history of present illness. She was evaluated in the context of the global COVID-19 pandemic, which necessitated consideration that the patient might be at risk for infection with the SARS-CoV-2 virus that causes COVID-19. Institutional protocols and algorithms that pertain to the evaluation of patients at risk for COVID-19 are in a state of rapid change based on information released by regulatory bodies including the CDC and federal and state organizations. These policies and algorithms were followed during the patient's care in the ED.  Some ED evaluations and interventions may be delayed as a result of limited staffing during the pandemic.*     Medical Decision Making:  87 yo F here with generalized weakness. H/o similar presentations in setting of dehydration in the past. Pt is otherwise very well appearing and in NAD. No focal neuro deficits. NO fever, chills. No headache. No focal neuro deficits. Labs reviewed, show mild baseline CKD but no acute changes or abnormalities. CBC without anemia or leukocytosis. UA without signs of UTI or infection. Denies any urinary sx. CXR is clear. EKG non ischemic and no CP reported.  Patient feels much better after IV fluids here.  She would like to attempt outpatient management with continued hydration.  I encouraged her to start using Ensure as I suspect some of her weakness is due to poor calorie intake as well.  Otherwise, daughter is here and will keep an eye on her at home.  Return precautions given.  No apparent emergent pathology otherwise, discharged with outpatient follow-up.  ____________________________________________  FINAL CLINICAL IMPRESSION(S) / ED DIAGNOSES  Final diagnoses:  Weakness  Dehydration     MEDICATIONS GIVEN DURING THIS VISIT:  Medications   lactated ringers bolus 1,000 mL (0 mLs Intravenous Stopped 01/20/21 1645)     ED Discharge Orders    None       Note:  This document was prepared using Dragon voice recognition software and may include unintentional dictation errors.   Duffy Bruce, MD 01/20/21 [phone]

## 2021-01-20 NOTE — ED Triage Notes (Signed)
Per pt daughter, pt has been having increased weakness, PCP concerned she is dehydrated, not drinking enough fluids. States she has an appt with nephrologist for concerned for renal function. Pt does live alone. Intermittently has nausea denies vomiting

## 2021-01-21 ENCOUNTER — Telehealth: Payer: Self-pay | Admitting: Internal Medicine

## 2021-01-21 NOTE — Telephone Encounter (Signed)
Pt was seen in the ED yesterday for Weakness. She wanted to schedule an appt with Dr. Derrel Nip asap

## 2021-01-22 NOTE — Telephone Encounter (Signed)
Spoke with pt and rescheduled her appt for later in the day. She stated that 9 am was to early for her.

## 2021-01-25 ENCOUNTER — Other Ambulatory Visit: Payer: Self-pay

## 2021-01-25 ENCOUNTER — Emergency Department
Admission: EM | Admit: 2021-01-25 | Discharge: 2021-01-25 | Disposition: A | Discharge status: Home / Self Care | Payer: Medicare Other | Attending: Emergency Medicine | Admitting: Emergency Medicine

## 2021-01-25 DIAGNOSIS — Z7902 Long term (current) use of antithrombotics/antiplatelets: Secondary | ICD-10-CM | POA: Insufficient documentation

## 2021-01-25 DIAGNOSIS — N184 Chronic kidney disease, stage 4 (severe): Secondary | ICD-10-CM | POA: Diagnosis not present

## 2021-01-25 DIAGNOSIS — Z79899 Other long term (current) drug therapy: Secondary | ICD-10-CM | POA: Insufficient documentation

## 2021-01-25 DIAGNOSIS — Z8616 Personal history of COVID-19: Secondary | ICD-10-CM | POA: Insufficient documentation

## 2021-01-25 DIAGNOSIS — Z85828 Personal history of other malignant neoplasm of skin: Secondary | ICD-10-CM | POA: Diagnosis not present

## 2021-01-25 DIAGNOSIS — I5032 Chronic diastolic (congestive) heart failure: Secondary | ICD-10-CM | POA: Diagnosis not present

## 2021-01-25 DIAGNOSIS — I25118 Atherosclerotic heart disease of native coronary artery with other forms of angina pectoris: Secondary | ICD-10-CM | POA: Insufficient documentation

## 2021-01-25 DIAGNOSIS — R531 Weakness: Secondary | ICD-10-CM

## 2021-01-25 DIAGNOSIS — J449 Chronic obstructive pulmonary disease, unspecified: Secondary | ICD-10-CM | POA: Diagnosis not present

## 2021-01-25 DIAGNOSIS — Z87891 Personal history of nicotine dependence: Secondary | ICD-10-CM | POA: Insufficient documentation

## 2021-01-25 DIAGNOSIS — Z951 Presence of aortocoronary bypass graft: Secondary | ICD-10-CM | POA: Insufficient documentation

## 2021-01-25 DIAGNOSIS — I130 Hypertensive heart and chronic kidney disease with heart failure and stage 1 through stage 4 chronic kidney disease, or unspecified chronic kidney disease: Secondary | ICD-10-CM | POA: Insufficient documentation

## 2021-01-25 DIAGNOSIS — Z20822 Contact with and (suspected) exposure to covid-19: Secondary | ICD-10-CM | POA: Insufficient documentation

## 2021-01-25 LAB — CBC
HCT: 37.2 % (ref 36.0–46.0)
Hemoglobin: 12.4 g/dL (ref 12.0–15.0)
MCH: 34 pg (ref 26.0–34.0)
MCHC: 33.3 g/dL (ref 30.0–36.0)
MCV: 101.9 fL — ABNORMAL HIGH (ref 80.0–100.0)
Platelets: 212 10*3/uL (ref 150–400)
RBC: 3.65 MIL/uL — ABNORMAL LOW (ref 3.87–5.11)
RDW: 11.4 % — ABNORMAL LOW (ref 11.5–15.5)
WBC: 7 10*3/uL (ref 4.0–10.5)
nRBC: 0 % (ref 0.0–0.2)

## 2021-01-25 LAB — BASIC METABOLIC PANEL
Anion gap: 9 (ref 5–15)
BUN: 24 mg/dL — ABNORMAL HIGH (ref 8–23)
CO2: 26 mmol/L (ref 22–32)
Calcium: 8.8 mg/dL — ABNORMAL LOW (ref 8.9–10.3)
Chloride: 100 mmol/L (ref 98–111)
Creatinine, Ser: 1.42 mg/dL — ABNORMAL HIGH (ref 0.44–1.00)
GFR, Estimated: 35 mL/min — ABNORMAL LOW (ref >60)
Glucose, Bld: 110 mg/dL — ABNORMAL HIGH (ref 70–99)
Potassium: 4.2 mmol/L (ref 3.5–5.1)
Sodium: 135 mmol/L (ref 135–145)

## 2021-01-25 LAB — RESP PANEL BY RT-PCR (FLU A&B, COVID) ARPGX2
Influenza A by PCR: NEGATIVE
Influenza B by PCR: NEGATIVE
SARS Coronavirus 2 by RT PCR: NEGATIVE

## 2021-01-25 MED ORDER — NYSTATIN 100000 UNIT/ML MT SUSP: OROMUCOSAL | 0 refills | Status: DC

## 2021-01-25 NOTE — ED Notes (Signed)
Says continued weakness general since she was here last week.  Says she is drinking enough and eating.  Has not seen pcp yet in follow up.  Says no urinary problems and urine looks clear.

## 2021-01-25 NOTE — ED Triage Notes (Signed)
Pt to ED POV with daughter for weakness and dizziness x1 week, was recently treated here for same and states felt better after IVF.  Denies recent falls.  Alert and oriented, clear speech

## 2021-01-25 NOTE — ED Provider Notes (Signed)
[facility] Emergency Department Provider Note  Time seen: 2:44 PM  I have reviewed the triage vital signs and the nursing notes.   HISTORY  Chief Complaint Weakness   HPI [name] is a 87 y.o. female with a past medical history of CAD, COPD, gastric reflux, hypertension, hyperlipidemia, presents to the emergency department for weakness.  According to the patient over the past week or 2 she has been feeling more weak than typical.  Having trouble doing her normal amount of activity around the house which per the daughter is considerable for her age.  Patient states she was very active until December when she had COVID prior to that she was still playing golf.  Patient states over the past 1 to 2 weeks she has begun feeling weak once again.  Denies any fever or cough.  No chest pain or abdominal pain.  Patient states she was here for the same 3 to 4 days ago and had a reassuring work-up but states she is still feeling weak and wanted to get checked out to make sure nothing had gone wrong.   Past Medical History:  Diagnosis Date  . C. difficile colitis   . CAD (coronary artery disease)    a. s/p CABG 1999; b. Nuclear 10/11, no scar or ischemia, EF 68%; b. [place] 06/18/14: no evidence of infarction or ischemia, EF 77%, low risk study; c. 02/2019 MV: EF 56%, ? mild lat ischemia->low risk.  . Carotid artery disease ([place])    a. Doppler January, 2012, 40-59% bilateral; b. 02/2017 U/S: 1-39% bilat ICA stenosis.  . Closed fracture pubis ([place]) 07/04/2017  . COPD (chronic obstructive pulmonary disease) ([place])   . Diffuse cystic mastopathy   . Diverticulitis    last colonoscopy  incomplete Jan 2011  . Dizziness    stable now (Oct 2011) patient tells me question of TIA, we will obtain records  . Gait abnormality    Patient complains of "wobbly gait", December, 2013  . GERD (gastroesophageal reflux disease)   . History of migraines   . Hx of CABG    a. 3  vessel in 1999  . Hyperlipidemia   . Hypertension   . Indigestion    3 weeks, October 2011  . Kidney stones   . Rheumatic fever   . Sinus bradycardia    Mild, December, 2013  . SOB (shortness of breath)   . Syncopal episodes    after 18 holes of golf and increase hydrochlorothiazide, resolved     Patient Active Problem List   Diagnosis Date Noted  . CKD (chronic kidney disease) stage 4, GFR 15-29 ml/min (HCC) 01/15/2021  . Grief 04/08/2020  . History of esophageal stricture 04/08/2020  . Leg swelling 03/09/2020  . Left-sided chest wall pain 01/10/2020  . Anorexia 01/10/2020  . B12 deficiency 01/09/2020  . Generalized weakness 07/14/2019  . Mild malnutrition ([place]) 03/25/2019  . Diastolic CHF, chronic ([place]) 03/25/2019  . Vitamin D deficiency 03/25/2019  . Melena 02/03/2018  . S/P excision of skin lesion, follow-up exam 12/26/2017  . Impingement syndrome of left shoulder region 07/04/2017  . Neck pain 07/04/2017  . Allergic rhinitis 12/31/2016  . SCC (squamous cell carcinoma), face 08/01/2016  . Osteoporosis of forearm 02/13/2016  . Dizziness and giddiness 02/13/2016  . Insomnia secondary to anxiety 07/12/2015  . Anxiety disorder 07/12/2015  . Essential (primary) hypertension 10/07/2014  . Sinus bradycardia   . Hardening of the aorta (main artery of the heart) (  [place]) 06/26/2014  . History of Clostridium difficile colitis 10/20/2013  . Personal history of other diseases of the digestive system 10/20/2013  . Low back pain 10/04/2013  . Encounter for general adult medical examination without abnormal findings 02/07/2013  . Chest pain at rest 12/26/2011  . COPD, moderate ([place]) 12/05/2011  . Presence of aortocoronary bypass graft   . Stage 3b chronic kidney disease ([place]) 09/25/2011  . Hyperlipidemia   . Hypertension   . SOB (shortness of breath)   . Coronary artery disease of native artery of native heart with stable angina pectoris ([place])   . Carotid artery disease [facility])      Past Surgical History:  Procedure Laterality Date  . APPENDECTOMY  1950  . BREAST BIOPSY Right 1994  . BREAST CYST ASPIRATION     multiple BIL  . CATARACT EXTRACTION Right 2009  . CATARACT EXTRACTION Left 2011  . COLONOSCOPY  [id number]   Dr. Jamal Collin  . CORONARY ARTERY BYPASS GRAFT  1999  . esophagus stretched    . TONSILLECTOMY  1939  . TOTAL ABDOMINAL HYSTERECTOMY  1968   due to metrorrhagia    Prior to Admission medications   Medication Sig Start Date End Date Taking? Authorizing Provider  atorvastatin (LIPITOR) 40 MG tablet TAKE ONE TABLET BY MOUTH EVERY DAY 11/09/20   [name], MD  clopidogrel (PLAVIX) 75 MG tablet Take 1 tablet (75 mg total) by mouth daily. 04/07/20   Minna Merritts, MD  esomeprazole (NEXIUM) 40 MG capsule Take 40 mg by mouth daily at 12 noon.    [provider]  Ipratropium-Albuterol (COMBIVENT) 20-100 MCG/ACT AERS respimat Inhale 1 puff into the lungs every 6 (six) hours as needed for wheezing. 02/05/20   Minna Merritts, MD  isosorbide mononitrate (IMDUR) 60 MG 24 hr tablet Take 1.5 tablets (90 mg total) by mouth 2 (two) times daily. 04/07/20   Minna Merritts, MD  losartan (COZAAR) 100 MG tablet Take 1 tablet (100 mg total) by mouth daily. 04/07/20   Minna Merritts, MD  nitroGLYCERIN (NITROSTAT) 0.4 MG SL tablet PLACE 1 TABLET UNDER TONGUE EVERY 5 MIN AS NEEDED FOR CHEST PAIN IF NO RELIEF IN15 MIN CALL 911 (MAX 3 TABS) 04/07/20   Gollan, Kathlene November, MD  Probiotic Product (PROBIOTIC PO) Take 1 capsule by mouth daily.    [provider]  propranolol (INDERAL) 20 MG tablet TAKE ONE TABLET 3 TIMES DAILY AS NEEDED FOR TACHYCARDIA 04/07/20   Minna Merritts, MD  ranolazine (RANEXA) 500 MG 12 hr tablet Take 1 tablet (500 mg total) by mouth 2 (two) times daily. 04/07/20   Minna Merritts, MD  vitamin B-12 (CYANOCOBALAMIN) 500 MCG tablet Take 500 mcg by mouth daily.    [provider]    Allergies  Allergen Reactions  .  Ciprofloxacin     ? Caused c diff     Family History  Problem Relation Age of Onset  . Heart disease Mother   . Heart disease Father   . Cancer Neg Hx   . Drug abuse Neg Hx   . Breast cancer Neg Hx     Social History Social History   Tobacco Use  . Smoking status: Former Smoker    Packs/day: 0.50    Years: 40.00    Pack years: 20.00    Types: Cigarettes    Quit date: 09/20/1983    Years since quitting: 37.3  . Smokeless tobacco: Never Used  Vaping Use  .  Vaping Use: Never used  Substance Use Topics  . Alcohol use: Not Currently    Comment: yes, social wine  . Drug use: No    Review of Systems Constitutional: Negative for fever.  Generalized weakness. Cardiovascular: Negative for chest pain. Respiratory: Negative for shortness of breath. Gastrointestinal: Negative for abdominal pain, vomiting and diarrhea. Genitourinary: Negative for urinary compaints Musculoskeletal: Negative for musculoskeletal complaints Skin: Negative for skin complaints  Neurological: Negative for headache All other ROS negative  ____________________________________________   PHYSICAL EXAM:  VITAL SIGNS: ED Triage Vitals [01/25/21 1256]  Enc Vitals Group     BP 127/63     Pulse Rate 76     Resp 18     Temp 98.6 F (37 C)     Temp Source Oral     SpO2 96 %     Weight 125 lb (56.7 kg)     Height 5\' 3"  (1.6 m)     Head Circumference      Peak Flow      Pain Score 0     Pain Loc      Pain Edu?      Excl. in [place]?     Constitutional: Alert and oriented. Well appearing and in no distress. Eyes: Normal exam ENT      Head: Normocephalic and atraumatic.      Mouth/Throat: Mucous membranes are moist.  Somewhat edematous and erythematous tongue which she states started after using an oral steroid.  Suspicious for possible Candida. Cardiovascular: Normal rate, regular rhythm Respiratory: Normal respiratory effort without tachypnea nor retractions. Breath sounds are  clear Gastrointestinal: Soft and nontender. No distention.   Musculoskeletal: Nontender with normal range of motion in all extremities. No lower extremity tenderness or edema. Neurologic:  Normal speech and language. No gross focal neurologic deficits Skin:  Skin is warm, dry and intact.  Psychiatric: Mood and affect are normal.   ____________________________________________    EKG  EKG viewed and interpreted by myself shows a normal sinus rhythm at 76 bpm with a narrow QRS, normal axis, normal intervals, nonspecific ST changes.  Unchanged from prior EKG.  ____________________________________________   INITIAL IMPRESSION / ASSESSMENT AND PLAN / ED COURSE  Pertinent labs & imaging results that were available during my care of the patient were reviewed by me and considered in my medical decision making (see chart for details).   Patient presents emergency department for generalized weakness over the past 1 to 2 weeks.  Overall the patient appears well.  Reassuring labs and vitals.  I reviewed the patient's chart including lab work from 4 days ago when she was last seen in the emergency department.  Overall the patient appears very well for her age.  She does have possible remnants of thrush, finished the nystatin solution approximately 1 week ago.  We will place the patient on Magic mouthwash.  I have ordered a COVID/flu swab for the patient as well as a precaution although the patient states she had COVID in December.  Lab work is reassuring.  We will discharge the patient home and have her follow-up with her doctor.  Patient agreeable plan of care.  Discussed supportive care at home including plenty of rest and oral hydration.  KYLIE SIMMONDS was evaluated in Emergency Department on 01/25/2021 for the symptoms described in the history of present illness. She was evaluated in the context of the global COVID-19 pandemic, which necessitated consideration that the patient might be at risk for  infection with  the SARS-CoV-2 virus that causes COVID-19. Institutional protocols and algorithms that pertain to the evaluation of patients at risk for COVID-19 are in a state of rapid change based on information released by regulatory bodies including the CDC and federal and state organizations. These policies and algorithms were followed during the patient's care in the ED.  ____________________________________________   FINAL CLINICAL IMPRESSION(S) / ED DIAGNOSES  Weakness   Harvest Dark, MD 01/25/21 1448

## 2021-01-28 NOTE — Telephone Encounter (Signed)
Stay on ranexa and imdur

## 2021-02-01 ENCOUNTER — Ambulatory Visit (INDEPENDENT_AMBULATORY_CARE_PROVIDER_SITE_OTHER): Payer: Medicare Other | Admitting: Internal Medicine

## 2021-02-01 ENCOUNTER — Ambulatory Visit: Payer: Medicare Other | Admitting: Internal Medicine

## 2021-02-01 ENCOUNTER — Encounter: Payer: Self-pay | Admitting: Internal Medicine

## 2021-02-01 ENCOUNTER — Other Ambulatory Visit: Payer: Self-pay

## 2021-02-01 VITALS — BP 130/82 | HR 72 | Temp 98.3°F | Ht 63.0 in | Wt 122.2 lb

## 2021-02-01 DIAGNOSIS — N184 Chronic kidney disease, stage 4 (severe): Secondary | ICD-10-CM

## 2021-02-01 DIAGNOSIS — F5105 Insomnia due to other mental disorder: Secondary | ICD-10-CM

## 2021-02-01 DIAGNOSIS — N2581 Secondary hyperparathyroidism of renal origin: Secondary | ICD-10-CM

## 2021-02-01 DIAGNOSIS — I6523 Occlusion and stenosis of bilateral carotid arteries: Secondary | ICD-10-CM

## 2021-02-01 DIAGNOSIS — R531 Weakness: Secondary | ICD-10-CM

## 2021-02-01 DIAGNOSIS — J449 Chronic obstructive pulmonary disease, unspecified: Secondary | ICD-10-CM

## 2021-02-01 DIAGNOSIS — I5032 Chronic diastolic (congestive) heart failure: Secondary | ICD-10-CM | POA: Diagnosis not present

## 2021-02-01 DIAGNOSIS — F419 Anxiety disorder, unspecified: Secondary | ICD-10-CM | POA: Diagnosis not present

## 2021-02-01 DIAGNOSIS — E441 Mild protein-calorie malnutrition: Secondary | ICD-10-CM

## 2021-02-01 NOTE — Patient Instructions (Signed)
  I recommend trying melatonin for your insomnia.  It is not a sedative,  But must be taken on  a regular basis to help your internal clock.  Take every evening after dinner start with 5 mg dose   Max effective dose is 10 mg   PT referral to [facility] for weakness   Do not resume amlodipine .    Your BP is fine without it    You can cancel the nephrology evaluation . You do not need it.   your kidney function is slightly low but stable and has not changed over the past 2 years.  Continue to avoid using Advil, motrin and aleve on a regular basis as theses can harm kidney function with regular use.     You can add up to 2000 mg of acetominophen (tylenol) every day safely  In divided doses (500 mg every 6 hours  Or 1000 mg every 12 hours.)   Keep drinking your lemon drink .   And your Equal in your  Coffee.

## 2021-02-01 NOTE — Telephone Encounter (Signed)
Noted inform patient stay on ranexa and imdur  Dr. Olivia Mackie McLean-Scocuzza

## 2021-02-01 NOTE — Progress Notes (Signed)
Subjective:  Patient ID: [name], female    DOB: 06/26/1926  Age: 87 y.o. MRN: [id number]  CC: The primary encounter diagnosis was Generalized weakness. Diagnoses of Hyperparathyroidism due to renal insufficiency (HCC), Mild malnutrition ([place]), Diastolic CHF, chronic ([place]), COPD, moderate ([place]), CKD (chronic kidney disease) stage 4, GFR 15-29 ml/min ([place]), and Insomnia secondary to anxiety were also pertinent to this visit.  HPI [name] presents for follow up on 2 recent ER visits for generalized weakness .  She is accompanied by her daughter, Guy Franco   This visit occurred during the SARS-CoV-2 public health emergency.  Safety protocols were in place, including screening questions prior to the visit, additional usage of staff PPE, and extensive cleaning of exam room while observing appropriate contact time as indicated for disinfecting solutions.    [name] is a delightful 87 yr old woman with CAD and COPD who has had 2 ER visits for generalized weakness , both of which occurred last week on  May 4 and May 9.  Labs were normal.  COVID RETESTED AND NEGATIVE. ( Had COVID infection   In  December.).  Energy level has not returned to normal .  She states that she can no longer complete her household activities,  Play golf ,  or participate in her usual activities as easily as she could prior to becoming infected with COVDas many tasks at home.  At last visit her short term memory appeared to be impaired as well  "I'm so weak>''  Using a walking cane but no history of falls.  Previous episode of thrush now resolved.  aggravated by use of Anoro, which she is no longer using , in favor of using combivent  Renal disease;  Referred by Dr Aundra Dubin to Nephrology without discussion.  Reviewed labs. She acknowledges her age and appreciates dr Claris Gladden thoroughness but does not want to see nephrology  Reviewed recent imaging studies and procedures with patient and daughter    EF 5 to  55% by Mary 2021 ECHO.  Chest x ray May 2022  No acute changes..  Some bilateral pleural scarring        Outpatient Medications Prior to Visit  Medication Sig Dispense Refill  . atorvastatin (LIPITOR) 40 MG tablet TAKE ONE TABLET BY MOUTH EVERY DAY 90 tablet 1  . clopidogrel (PLAVIX) 75 MG tablet Take 1 tablet (75 mg total) by mouth daily. 90 tablet 3  . esomeprazole (NEXIUM) 40 MG capsule Take 40 mg by mouth daily at 12 noon.    . Ipratropium-Albuterol (COMBIVENT) 20-100 MCG/ACT AERS respimat Inhale 1 puff into the lungs every 6 (six) hours as needed for wheezing. 4 g 6  . isosorbide mononitrate (IMDUR) 60 MG 24 hr tablet Take 1.5 tablets (90 mg total) by mouth 2 (two) times daily. 270 tablet 3  . losartan (COZAAR) 100 MG tablet Take 1 tablet (100 mg total) by mouth daily. 90 tablet 3  . magic mouthwash (nystatin, lidocaine, diphenhydrAMINE, alum & mag hydroxide) suspension Mix ingredients 1:1:1:1 Swish 66ml twice daily for th next 14 days 180 mL 0  . nitroGLYCERIN (NITROSTAT) 0.4 MG SL tablet PLACE 1 TABLET UNDER TONGUE EVERY 5 MIN AS NEEDED FOR CHEST PAIN IF NO RELIEF IN15 MIN CALL 911 (MAX 3 TABS) 25 tablet 3  . Probiotic Product (PROBIOTIC PO) Take 1 capsule by mouth daily.    . propranolol (INDERAL) 20 MG tablet TAKE ONE TABLET 3 TIMES DAILY AS NEEDED FOR TACHYCARDIA 270 tablet 3  .  ranolazine (RANEXA) 500 MG 12 hr tablet Take 1 tablet (500 mg total) by mouth 2 (two) times daily. 180 tablet 3  . vitamin B-12 (CYANOCOBALAMIN) 500 MCG tablet Take 500 mcg by mouth daily.     No facility-administered medications prior to visit.    Review of Systems;  Patient denies headache, fevers, malaise, unintentional weight loss, skin rash, eye pain, sinus congestion and sinus pain, sore throat, dysphagia,  hemoptysis , cough, dyspnea, wheezing, chest pain, palpitations, orthopnea, edema, abdominal pain, nausea, melena, diarrhea, constipation, flank pain, dysuria, hematuria, urinary  Frequency,  nocturia, numbness, tingling, seizures,  Focal weakness, Loss of consciousness,  Tremor, insomnia, depression, anxiety, and suicidal ideation.      Objective:  BP 130/82 (BP Location: Left Arm, Patient Position: Sitting, Cuff Size: Normal)   Pulse 72   Temp 98.3 F (36.8 C) (Oral)   Ht 5\' 3"  (1.6 m)   Wt 122 lb 3.2 oz (55.4 kg)   SpO2 96%   BMI 21.65 kg/m   BP Readings from Last 3 Encounters:  02/01/21 130/82  01/25/21 (!) 141/68  01/20/21 (!) 172/59    Wt Readings from Last 3 Encounters:  02/01/21 122 lb 3.2 oz (55.4 kg)  01/25/21 125 lb (56.7 kg)  01/20/21 123 lb (55.8 kg)    General appearance: alert, cooperative and appears stated age Ears: normal TM's and external ear canals both ears Throat: lips, mucosa, and tongue normal; teeth and gums normal Neck: no adenopathy, no carotid bruit, supple, symmetrical, trachea midline and thyroid not enlarged, symmetric, no tenderness/mass/nodules Back: symmetric, no curvature. ROM normal. No CVA tenderness. Lungs: clear to auscultation bilaterally Heart: regular rate and rhythm, S1, S2 normal, no murmur, click, rub or gallop Abdomen: soft, non-tender; bowel sounds normal; no masses,  no organomegaly Pulses: 2+ and symmetric Skin: Skin color, texture, turgor normal. No rashes or lesions Lymph nodes: Cervical, supraclavicular, and axillary nodes normal.  Lab Results  Component Value Date   HGBA1C 5.8 01/30/2017   HGBA1C 5.8 09/26/2016    Lab Results  Component Value Date   CREATININE 1.42 (H) 01/25/2021   CREATININE 1.47 (H) 01/20/2021   CREATININE 1.72 (H) 01/15/2021    Lab Results  Component Value Date   WBC 7.0 01/25/2021   HGB 12.4 01/25/2021   HCT 37.2 01/25/2021   PLT 212 01/25/2021   GLUCOSE 110 (H) 01/25/2021   CHOL 188 06/11/2020   TRIG 138.0 06/11/2020   HDL 56.60 06/11/2020   LDLDIRECT 72.0 11/09/2015   LDLCALC 103 (H) 06/11/2020   ALT 14 01/15/2021   AST 18 01/15/2021   NA 135 01/25/2021   K 4.2  01/25/2021   CL 100 01/25/2021   CREATININE 1.42 (H) 01/25/2021   BUN 24 (H) 01/25/2021   CO2 26 01/25/2021   TSH 2.32 01/15/2021   INR 0.9 01/31/2018   HGBA1C 5.8 01/30/2017   MICROALBUR 2.5 (H) 12/26/2011    No results found.  Assessment & Plan:   Problem List Items Addressed This Visit      Unprioritized   CKD (chronic kidney disease) stage 4, GFR 15-29 ml/min (HCC)    Reviewed her GFR and other labs with patient and daughter.  There is no electrolyte imbalance or anemia.  She declines nephrology evaluation.   Reminded to  avoid NSAIDS and control BP       COPD, moderate (HCC)    Currently asymptomaticwithout Anoro once daily  which caused recurrent thrush. Refilling nystatin suspension as needed.  Regimen now to  be combivent only qid per patient preference to avoid steroids.       Diastolic CHF, chronic (HCC)   Generalized weakness - Primary    With 2 recent ER evaluations (days apart) failing to reveal any reversible cause.  Symptoms are most likely due to recent COVID infection on the significantly aged.  PT referral in place to reduce patient's use of ER       Relevant Orders   Ambulatory referral to Physical Therapy   Hyperparathyroidism due to renal insufficiency (HCC)   Insomnia secondary to anxiety    She has not taken any benzodiazepines in several months.  Reviewed the risks associated with b/d use in elderly with plans NOT to restart.  Trial of melatonin 5 mg at dinnertime      Mild malnutrition (HCC)    4 lb wt loss over the past year noted.  . I have reviewed her diet and recommended that she increase her protein and fat intake while monitoring her carbohydrates.         A total of 40 minutes was spent with patient more than half of which was spent in counseling patient and daughter on the age related energy decline she is experiencing,  Significance of CKD, alternative medicaitons for managing COPD , reviewing and explaining recent ER evaluations , labs  and imaging studies done, and coordination of care.  I am having [name] maintain her Ipratropium-Albuterol, esomeprazole, clopidogrel, isosorbide mononitrate, losartan, nitroGLYCERIN, propranolol, ranolazine, Probiotic Product (PROBIOTIC PO), vitamin B-12, atorvastatin, and (magic mouthwash (nystatin, lidocaine, diphenhydrAMINE, alum & mag hydroxide) suspension).  No orders of the defined types were placed in this encounter.   There are no discontinued medications.  Follow-up: Return in about 2 months (around 04/03/2021).   [name], MD

## 2021-02-02 ENCOUNTER — Telehealth: Payer: Self-pay | Admitting: Internal Medicine

## 2021-02-02 DIAGNOSIS — N2581 Secondary hyperparathyroidism of renal origin: Secondary | ICD-10-CM | POA: Insufficient documentation

## 2021-02-02 NOTE — Assessment & Plan Note (Addendum)
Currently asymptomaticwithout Anoro once daily  which caused recurrent thrush. Refilling nystatin suspension as needed.  Regimen now to be combivent only qid per patient preference to avoid steroids.

## 2021-02-02 NOTE — Telephone Encounter (Signed)
Called and informed the Patient. She states that her phone was breaking up and could not hear me. Patient states she needed to switch phones. She hung up. Line busy when calling back

## 2021-02-02 NOTE — Assessment & Plan Note (Addendum)
With 2 recent ER evaluations (days apart) failing to reveal any reversible cause.  Symptoms are most likely due to recent COVID infection on the significantly aged.  PT referral in place to reduce patient's use of ER

## 2021-02-02 NOTE — Assessment & Plan Note (Signed)
4 lb wt loss over the past year noted.  . I have reviewed her diet and recommended that she increase her protein and fat intake while monitoring her carbohydrates.

## 2021-02-02 NOTE — Assessment & Plan Note (Deleted)
Reviewed her GFR and other labs with patient and daughter.  There is no electrolyte imbalance or anemia.  She declines nephrology evaluation.   Reminded to  avoid NSAIDS and control BP

## 2021-02-02 NOTE — Telephone Encounter (Signed)
PT called to advise she kept getting disconnected and would like for them to call back on her cell # to reach her.

## 2021-02-02 NOTE — Assessment & Plan Note (Signed)
She has not taken any benzodiazepines in several months.  Reviewed the risks associated with b/d use in elderly with plans NOT to restart.  Trial of melatonin 5 mg at dinnertime

## 2021-02-02 NOTE — Assessment & Plan Note (Signed)
Reviewed her GFR and other labs with patient and daughter.  There is no electrolyte imbalance or anemia.  She declines nephrology evaluation.   Reminded to  avoid NSAIDS and control BP

## 2021-02-05 NOTE — Telephone Encounter (Signed)
Left message to return call. °Mychart message sent as well.  °

## 2021-02-09 ENCOUNTER — Ambulatory Visit (INDEPENDENT_AMBULATORY_CARE_PROVIDER_SITE_OTHER): Payer: Medicare Other | Admitting: Pharmacist

## 2021-02-09 DIAGNOSIS — I10 Essential (primary) hypertension: Secondary | ICD-10-CM | POA: Diagnosis not present

## 2021-02-09 DIAGNOSIS — I25118 Atherosclerotic heart disease of native coronary artery with other forms of angina pectoris: Secondary | ICD-10-CM

## 2021-02-09 DIAGNOSIS — J449 Chronic obstructive pulmonary disease, unspecified: Secondary | ICD-10-CM

## 2021-02-09 DIAGNOSIS — E785 Hyperlipidemia, unspecified: Secondary | ICD-10-CM | POA: Diagnosis not present

## 2021-02-09 DIAGNOSIS — N1832 Chronic kidney disease, stage 3b: Secondary | ICD-10-CM

## 2021-02-09 NOTE — Telephone Encounter (Signed)
Error. ng 

## 2021-02-09 NOTE — Chronic Care Management (AMB) (Signed)
Chronic Care Management Pharmacy Note  02/09/2021 Name:  [name] MRN:  [id number] DOB:  06-Jun-1926  Subjective: [name] is an 87 y.o. year old female who is a primary patient of Tullo, Aris Everts, MD.  The CCM team was consulted for assistance with disease management and care coordination needs.    Engaged with patient by telephone for follow up visit in response to provider referral for pharmacy case management and/or care coordination services.   Consent to Services:  The patient was given information about Chronic Care Management services, agreed to services, and gave verbal consent prior to initiation of services.  Please see initial visit note for detailed documentation.   Patient Care Team: [name], MD as PCP - General (Internal Medicine) Minna Merritts, MD as PCP - Cardiology (Cardiology) Minna Merritts, MD (Cardiology) Christene Lye, MD (General Surgery) Karlyn Agee, MD (Unknown Physician Specialty) De Hollingshead, RPH-CPP as Pharmacist (Pharmacist) Pleasant, Eppie Gibson, RN as [facility]  Recent office visits: 5/16 - PCP visit to f/u weakness. Ordered PT.    Hospital visits: ED visits 5/8, 5/4 for weakness. COVID negative.   Objective:  Lab Results  Component Value Date   CREATININE 1.42 (H) 01/25/2021   CREATININE 1.47 (H) 01/20/2021   CREATININE 1.72 (H) 01/15/2021    Lab Results  Component Value Date   HGBA1C 5.8 01/30/2017   Last diabetic Eye exam: No results found for: HMDIABEYEEXA  Last diabetic Foot exam: No results found for: HMDIABFOOTEX      Component Value Date/Time   CHOL 188 06/11/2020 1114   CHOL 143 06/18/2014 0419   TRIG 138.0 06/11/2020 1114   TRIG 126 06/18/2014 0419   HDL 56.60 06/11/2020 1114   HDL 46 06/18/2014 0419   CHOLHDL 3 06/11/2020 1114   VLDL 27.6 06/11/2020 1114   VLDL 25 06/18/2014 0419   LDLCALC 103 (H) 06/11/2020 1114   LDLCALC  72 06/18/2014 0419   LDLDIRECT 72.0 11/09/2015 1209    Hepatic Function Latest Ref Rng & Units 01/15/2021 12/11/2020 06/11/2020  Total Protein 6.0 - 8.3 g/dL 6.5 6.1 6.4  Albumin 3.5 - 5.2 g/dL 4.0 4.3 4.3  AST 0 - 37 U/L '18 11 13  ' ALT 0 - 35 U/L '14 10 11  ' Alk Phosphatase 39 - 117 U/L 66 71 56  Total Bilirubin 0.2 - 1.2 mg/dL 0.8 0.6 0.7  Bilirubin, Direct 0.0 - 0.3 mg/dL - - -    Lab Results  Component Value Date/Time   TSH 2.32 01/15/2021 11:39 AM   TSH 1.64 01/08/2020 12:03 PM    CBC Latest Ref Rng & Units 01/25/2021 01/20/2021 01/15/2021  WBC 4.0 - 10.5 K/uL 7.0 6.8 9.3  Hemoglobin 12.0 - 15.0 g/dL 12.4 12.7 12.8  Hematocrit 36.0 - 46.0 % 37.2 38.6 38.6  Platelets 150 - 400 K/uL 212 208 218.0    Lab Results  Component Value Date/Time   VD25OH 32.35 12/11/2020 02:18 PM   VD25OH 36.35 12/08/2016 08:52 AM    Clinical ASCVD: No      Social History   Tobacco Use  Smoking Status Former Smoker  . Packs/day: 0.50  . Years: 40.00  . Pack years: 20.00  . Types: Cigarettes  . Quit date: 09/20/1983  . Years since quitting: 37.4  Smokeless Tobacco Never Used   BP Readings from Last 3 Encounters:  02/01/21 130/82  01/25/21 (!) 141/68  01/20/21 (!) 172/59   Pulse  Readings from Last 3 Encounters:  02/01/21 72  01/25/21 76  01/20/21 63   Wt Readings from Last 3 Encounters:  02/01/21 122 lb 3.2 oz (55.4 kg)  01/25/21 125 lb (56.7 kg)  01/20/21 123 lb (55.8 kg)    Assessment: Review of patient past medical history, allergies, medications, health status, including review of consultants reports, laboratory and other test data, was performed as part of comprehensive evaluation and provision of chronic care management services.   SDOH:  (Social Determinants of Health) assessments and interventions performed:    CCM Care Plan  Allergies  Allergen Reactions  . Ciprofloxacin     ? Caused c diff     Medications Reviewed Today    Reviewed by De Hollingshead, RPH-CPP  (Pharmacist) on 02/09/21 at [facility] List Status: <None>  Medication Order Taking? Sig Documenting Provider Last Dose Status Informant  atorvastatin (LIPITOR) 40 MG tablet [id number] Yes TAKE ONE TABLET BY MOUTH EVERY DAY [name], MD Taking Active Child  clopidogrel (PLAVIX) 75 MG tablet [id number] Yes Take 1 tablet (75 mg total) by mouth daily. Minna Merritts, MD Taking Active Child  esomeprazole (NEXIUM) 40 MG capsule [id number] Yes Take 40 mg by mouth daily at 12 noon. [provider] Taking Active Child  Ipratropium-Albuterol (COMBIVENT) 20-100 MCG/ACT AERS respimat [id number] Yes Inhale 1 puff into the lungs every 6 (six) hours as needed for wheezing. Minna Merritts, MD Taking Active Child  isosorbide mononitrate (IMDUR) 60 MG 24 hr tablet [id number] Yes Take 1.5 tablets (90 mg total) by mouth 2 (two) times daily. Minna Merritts, MD Taking Active Child  losartan (COZAAR) 100 MG tablet [id number] Yes Take 1 tablet (100 mg total) by mouth daily. Minna Merritts, MD Taking Active Child  magic mouthwash (nystatin, lidocaine, diphenhydrAMINE, alum & mag hydroxide) suspension [id number] Yes Mix ingredients 1:1:1:1 Swish 48m twice daily for th next 14 days PHarvest Dark MD Taking Active   nitroGLYCERIN (NITROSTAT) 0.4 MG SL tablet [id number] PLACE 1 TABLET UNDER TONGUE EVERY 5 MIN AS NEEDED FOR CHEST PAIN IF NO RELIEF IN15 MIN CALL 911 (MAX 3 TABS) Gollan, TKathlene November MD Taking Active Child  Probiotic Product (PROBIOTIC PO) [id number] Take 1 capsule by mouth daily. [provider] Taking Active Child  propranolol (INDERAL) 20 MG tablet [id number] TAKE ONE TABLET 3 TIMES DAILY AS NEEDED FOR TACHYCARDIA  Patient not taking: Reported on 02/09/2021   GMinna Merritts MD Not Taking Active Child  ranolazine (RANEXA) 500 MG 12 hr tablet [id number] Take 1 tablet (500 mg total) by mouth 2 (two) times daily. GMinna Merritts MD Taking Active Child  vitamin B-12  (CYANOCOBALAMIN) 500 MCG tablet [id number] Take 500 mcg by mouth daily. [provider] Taking Active Child          Patient Active Problem List   Diagnosis Date Noted  . Hyperparathyroidism due to renal insufficiency ([place] 02/02/2021  . CKD (chronic kidney disease) stage 4, GFR 15-29 ml/min (HCC) 01/15/2021  . Grief 04/08/2020  . History of esophageal stricture 04/08/2020  . Leg swelling 03/09/2020  . Left-sided chest wall pain 01/10/2020  . Anorexia 01/10/2020  . B12 deficiency 01/09/2020  . Generalized weakness 07/14/2019  . Mild malnutrition ([place] 03/25/2019  . Diastolic CHF, chronic ([place] 03/25/2019  . Vitamin D deficiency 03/25/2019  . Melena 02/03/2018  . S/P excision of skin lesion, follow-up exam 12/26/2017  . Impingement syndrome of left shoulder region 07/04/2017  .  Neck pain 07/04/2017  . Allergic rhinitis 12/31/2016  . SCC (squamous cell carcinoma), face 08/01/2016  . Osteoporosis of forearm 02/13/2016  . Dizziness and giddiness 02/13/2016  . Insomnia secondary to anxiety 07/12/2015  . Anxiety disorder 07/12/2015  . Essential (primary) hypertension 10/07/2014  . Sinus bradycardia   . Hardening of the aorta (main artery of the heart) ([place]) 06/26/2014  . History of Clostridium difficile colitis 10/20/2013  . Personal history of other diseases of the digestive system 10/20/2013  . Low back pain 10/04/2013  . Encounter for general adult medical examination without abnormal findings 02/07/2013  . Chest pain at rest 12/26/2011  . COPD, moderate ([place]) 12/05/2011  . Presence of aortocoronary bypass graft   . Stage 3b chronic kidney disease ([place]) 09/25/2011  . Hyperlipidemia   . Hypertension   . SOB (shortness of breath)   . Coronary artery disease of native artery of native heart with stable angina pectoris ([place])   . Carotid artery disease ([place])     Immunization History  Administered Date(s) Administered  . Fluad Quad(high Dose 65+) 05/15/2019,  06/11/2020  . Influenza Split 07/02/2014, 07/08/2015, 05/20/2016  . Influenza Whole 06/11/2012  . Influenza, High Dose Seasonal PF 05/30/2018  . Moderna Sars-Covid-2 Vaccination 12/25/2020  . PFIZER(Purple Top)SARS-COV-2 Vaccination 09/23/2019, 10/14/2019, 07/20/2020  . Pneumococcal Conjugate-13 12/24/2013  . Pneumococcal Polysaccharide-23 09/16/2011, 11/09/2015  . Tdap 02/07/2013  . Zoster 07/26/2013  . Zoster Recombinat (Shingrix) 05/15/2017, 07/21/2017    Conditions to be addressed/monitored: CAD and HTN  Care Plan : Wellness (Adult)  Updates made by De Hollingshead, RPH-CPP since 02/09/2021 12:00 AM  Completed 02/09/2021  Care Plan : PharmD - Medication Management  Updates made by De Hollingshead, RPH-CPP since 02/09/2021 12:00 AM  Completed 02/09/2021  Problem: COPD, HTN, Angina Resolved 02/09/2021    Long-Range Goal: Disease Progression Prevention Completed 02/09/2021  Start Date: 09/29/2020  This Visit's Progress: On track  Recent Progress: On track  Priority: High  Note:   Current Barriers:   Complex patient with multiple comorbidities including COPD, CAD, HTN, angina, CKD with a high risk of disease progression     Pharmacist Clinical Goal(s):   Over the next 90 days, patient will achieve adherence to monitoring guidelines and medication adherence to achieve therapeutic efficacy through collaboration with PharmD and provider.    Interventions:  1:1 collaboration with [name], MD regarding development and update of comprehensive plan of care as evidenced by provider attestation and co-signature  Inter-disciplinary care team collaboration (see longitudinal plan of care)  Comprehensive medication review performed; medication list updated in electronic medical record   Health Maintenance:  Has PT scheduled for weakness. Denies transportation needs. Encouraged her to call the office if needed.    CAD, HTN, angina:  Well controlled; current treatment:  losartan 100 mg daily  Denies recent home BP monitoring. Last clinic reading at goal. Amlodipine discontinued   Antianginal regimen: ranolazine 500 mg BID, isosorbide 90 mg BID, propranolol 20 mg PRN tachycardia (has not needed lately), NTG PRN angina - reports occasional NTG use (<1 time per week, angina resolves upon first dose of nitroglycerin)  Continue current regimen at this time along with cardiology collaboration.    Hyperlipidemia and secondary ASCVD prevention  Uncontrolled but anticipated to be improved; current treatment: atorvastatin 40 mg daily   Antiplatelet regimen: clopidogrel 75 mg daily     Recommended to continue current regimen at this time and check lipids with next PCP visit   Chronic  Obstructive Pulmonary Disease:  Moderately well managed; current treatment: Combivent 20/100 mcg up to QID  Continuing to use nystatin mouthwash   Hx intolerance to Anoro, thrush concerns  1 exacerbations requiring treatment in the last 6 months - upper respiratory [facility], doxycycline script given. Reports resolution of cough.   Last PFT: 12/08/2011 simple spirometry FEV1/FVC 54%, FEV1 1.23 L (69%); moderate obstruction per Dr. Leonidas Romberg.   Recommend to continue current regimen at this time.    GERD:  Controlled per patient report; current regimen: esomeprazole 40 mg daily   Recommend to continue current regimen at this time.    Patient Goals/Self-Care Activities  Over the next 90 days, patient will:  - take medications as prescribed focus on medication adherence by utilizing adherence packaging from community pharmacy check blood pressure periodocally, document, and provide at future appointments   Follow Up Plan: Goals of care met. Closing CCM case      Medication Assistance: None required.  Patient affirms current coverage meets needs.  Patient's preferred pharmacy is:  [facility] - [place], [place] - [address] [place] [address] Phone: [phone] Fax: [phone]   Follow Up:  Patient agrees to Care Plan and Follow-up.  Plan: Goals of care met. Patient declined need for continued Pharmacist follow up. Closing CCM case  Catie Darnelle Maffucci, PharmD, [place], [place] Clinical Pharmacist [employer] at [place]

## 2021-02-09 NOTE — Patient Instructions (Signed)
Visit Information  PATIENT GOALS: Goals Addressed              This Visit's Progress     Patient Stated   .  COMPLETED: Medication Monitoring (pt-stated)        Patient Goals/Self-Care Activities . Over the next 90 days, patient will:  - take medications as prescribed focus on medication adherence by utilizing adherence packaging from community pharmacy check blood pressure periodocally, document, and provide at future appointments       Patient verbalizes understanding of instructions provided today and agrees to view in [name].   Plan: Goals of care met. Patient declined need for continued Pharmacist follow up. Closing CCM case  Catie Darnelle Maffucci, PharmD, [place], [place] Clinical Pharmacist [employer] at [place]

## 2021-02-11 DIAGNOSIS — H353221 Exudative age-related macular degeneration, left eye, with active choroidal neovascularization: Secondary | ICD-10-CM | POA: Diagnosis not present

## 2021-02-17 ENCOUNTER — Ambulatory Visit: Payer: Medicare Other | Attending: Internal Medicine

## 2021-02-17 ENCOUNTER — Other Ambulatory Visit: Payer: Self-pay

## 2021-02-17 ENCOUNTER — Other Ambulatory Visit: Payer: Self-pay | Admitting: *Deleted

## 2021-02-17 VITALS — BP 129/59 | HR 76 | Ht 63.0 in | Wt 128.0 lb

## 2021-02-17 DIAGNOSIS — R269 Unspecified abnormalities of gait and mobility: Secondary | ICD-10-CM | POA: Diagnosis not present

## 2021-02-17 DIAGNOSIS — R531 Weakness: Secondary | ICD-10-CM | POA: Diagnosis not present

## 2021-02-17 DIAGNOSIS — M6281 Muscle weakness (generalized): Secondary | ICD-10-CM | POA: Insufficient documentation

## 2021-02-17 DIAGNOSIS — R262 Difficulty in walking, not elsewhere classified: Secondary | ICD-10-CM | POA: Diagnosis not present

## 2021-02-17 DIAGNOSIS — R278 Other lack of coordination: Secondary | ICD-10-CM | POA: Diagnosis not present

## 2021-02-17 DIAGNOSIS — R2689 Other abnormalities of gait and mobility: Secondary | ICD-10-CM | POA: Insufficient documentation

## 2021-02-17 DIAGNOSIS — R2681 Unsteadiness on feet: Secondary | ICD-10-CM | POA: Insufficient documentation

## 2021-02-17 NOTE — Patient Outreach (Signed)
[facility]) Care Management  02/17/2021  [name] 1925-10-08 [id number]   Patient is being transferred to the Chronic Care Management program with provider office.   [place] Care Management [phone]

## 2021-02-17 NOTE — Therapy (Signed)
[place] MAIN [facility] SERVICES [address] [place], [place], [address] Phone: [phone]   Fax:  [phone]  Physical Therapy Evaluation  Patient Details  Name: [name] MRN: [id number] Date of Birth: 1926/06/02 Referring Provider (PT): Derrel Nip MD   Encounter Date: 02/17/2021   PT End of Session - 02/17/21 1725    Visit Number 1    Number of Visits 25    Date for PT Re-Evaluation 05/12/21    Authorization Time Period INITIAL EVAL = 02/17/2021    PT Start Time 1600    PT Stop Time 1700    PT Time Calculation (min) 60 min    Equipment Utilized During Treatment Gait belt    Activity Tolerance Patient tolerated treatment well    Behavior During Therapy WFL for tasks assessed/performed           Past Medical History:  Diagnosis Date  . C. difficile colitis   . CAD (coronary artery disease)    a. s/p CABG 1999; b. Nuclear 10/11, no scar or ischemia, EF 68%; b. [place] 06/18/14: no evidence of infarction or ischemia, EF 77%, low risk study; c. 02/2019 MV: EF 56%, ? mild lat ischemia->low risk.  . Carotid artery disease ([place])    a. Doppler January, 2012, 40-59% bilateral; b. 02/2017 U/S: 1-39% bilat ICA stenosis.  . Closed fracture pubis ([place]) 07/04/2017  . COPD (chronic obstructive pulmonary disease) ([place])   . Diffuse cystic mastopathy   . Diverticulitis    last colonoscopy  incomplete Jan 2011  . Dizziness    stable now (Oct 2011) patient tells me question of TIA, we will obtain records  . Gait abnormality    Patient complains of "wobbly gait", December, 2013  . GERD (gastroesophageal reflux disease)   . History of migraines   . Hx of CABG    a. 3 vessel in 1999  . Hyperlipidemia   . Hypertension   . Indigestion    3 weeks, October 2011  . Kidney stones   . Rheumatic fever   . Sinus bradycardia    Mild, December, 2013  . SOB (shortness of breath)   . Syncopal episodes    after 18 holes of golf and increase  hydrochlorothiazide, resolved     Past Surgical History:  Procedure Laterality Date  . APPENDECTOMY  1950  . BREAST BIOPSY Right 1994  . BREAST CYST ASPIRATION     multiple BIL  . CATARACT EXTRACTION Right 2009  . CATARACT EXTRACTION Left 2011  . COLONOSCOPY  [id number]   Dr. Jamal Collin  . CORONARY ARTERY BYPASS GRAFT  1999  . esophagus stretched    . TONSILLECTOMY  1939  . TOTAL ABDOMINAL HYSTERECTOMY  1968   due to metrorrhagia    Vitals:   02/17/21 1611  BP: (!) 129/59  Pulse: 76  SpO2: 97%  Weight: 128 lb (58.1 kg)  Height: 5\' 3"  (1.6 m)      Subjective Assessment - 02/17/21 1614    Subjective Patient reports She has been dealing with progressive LE muscle weakness over past several months. She states she lives alone in 2 story home and has remained independent with household activities and still drives locally but states her mobility has declined and she is not as active due to feeling weak. She states her daughter and family assist some with groceries. Patient reports her goal is regain some strength and she was seen a year ago here with good results per her  report.    Patient is accompained by: Family member   Daughter   Pertinent History Patient is a 87 year old female with referral from PCP for progressive weakness. Patient reports she is still mostly independent just moving around slower and not as active. She states she lives alone in a 2 story home without an AD for mobility. She reports her bedroom and bathroom are on 2nd level and that she has to negotiate steps with some difficulty. Past medical history is significant for HTN, CAD, Hardening of Aorta, Sinus Bradycardia, Diastolic CHF, COPD, Hyperparathyroidism, Osteoporosis, CKD- Stage 3, Hyperlipidemia, LBP, COVID- Dec 2021.    Limitations Lifting;Standing;Walking;House hold activities    How long can you sit comfortably? NA    How long can you stand comfortably? maybe 5-10 min    How long can you walk comfortably? <  500 feet probably    Diagnostic tests NA    Patient Stated Goals I want to get my legs stronger    Currently in Pain? No/denies              [facility] PT Assessment - 02/17/21 1617      Assessment   Medical Diagnosis Generalized weakness    Referring Provider (PT) Derrel Nip MD    Onset Date/Surgical Date 11/17/20    Hand Dominance Right    Next MD Visit none scheduled    Prior Therapy more than 1 year ago wtih success      Precautions   Precautions Fall      Restrictions   Weight Bearing Restrictions No      Balance Screen   Has the patient fallen in the past 6 months No    Has the patient had a decrease in activity level because of a fear of falling?  Yes    Is the patient reluctant to leave their home because of a fear of falling?  Yes      [place] residence    Living Arrangements Alone    Available Help at Discharge Family    Type of [place] to enter    Entrance Stairs-Number of Steps 4   bedroom is upstairs (14)   Entrance Stairs-Rails Can reach both    Home Layout Two level    [place] - single point;Grab bars - tub/shower;Hand held shower head      Prior Function   Level of [place] Retired    Leisure play bridge/reading; water flowers      Cognition   Overall Cognitive Status Within Functional Limits for tasks assessed    Attention Focused    Focused Attention Appears intact    Memory Appears intact    Awareness Appears intact    Problem Solving Appears intact    Executive Function Reasoning;Sequencing;Organizing;Decision Making;Initiating;Self Monitoring;Self Correcting    Reasoning Appears intact    Sequencing Appears intact    Organizing Appears intact    Decision Making Appears intact    Initiating Appears intact    Self Monitoring Appears intact    Self Correcting Appears intact      Observation/Other Assessments   Skin Integrity known skin tear  with bandage covering wound    Focus on Therapeutic Outcomes (FOTO)  45      Berg Balance Test   Sit to Stand Able to stand without using hands and stabilize independently    Standing Unsupported Able to stand  safely 2 minutes    Sitting with Back Unsupported but Feet Supported on Floor or Stool Able to sit safely and securely 2 minutes    Stand to Sit Sits safely with minimal use of hands    Transfers Able to transfer safely, minor use of hands    Standing Unsupported with Eyes Closed Able to stand 10 seconds safely    Standing Unsupported with Feet Together Able to place feet together independently and stand 1 minute safely    From Standing, Reach Forward with Outstretched Arm Can reach confidently >25 cm (10")    From Standing Position, Pick up Object from Floor Able to pick up shoe safely and easily    From Standing Position, Turn to Look Behind Over each Shoulder Looks behind from both sides and weight shifts well    Turn 360 Degrees Able to turn 360 degrees safely but slowly    Standing Unsupported, Alternately Place Feet on Step/Stool Able to stand independently and safely and complete 8 steps in 20 seconds    Standing Unsupported, One Foot in Front Able to take small step independently and hold 30 seconds    Standing on One Leg Tries to lift leg/unable to hold 3 seconds but remains standing independently    Total Score 49             OBJECTIVE  MUSCULOSKELETAL: Tremor: Absent Bulk: Normal Tone: Normal, no clonus  Posture Forward head and protracted shoulders   Gait Reciprocal short steps, mild scissoring gait at times yet no loss of balance- ambulated around 100 feet without AD  Strength R/L 3+/3+ Hip flexion 4/4 Hip external rotation 4/4 Hip internal rotation 4/4Hip extension  3+/3+ Hip abduction 4/4 Hip adduction 4/4 Knee extension 4/4 Knee flexion 4/4 Ankle Plantarflexion 4/4 Ankle Dorsiflexion   NEUROLOGICAL:  Mental Status Patient is oriented to  person, place and time.  Recent memory is intact.  Remote memory is intact.  Attention span and concentration are intact.  Expressive speech is intact.  Patient's fund of knowledge is within normal limits for educational level.   Sensation Grossly intact to light touch bilateral UEs/LEs as determined by testing dermatomes C2-T2/L2-S2 respectively Proprioception and hot/cold testing deferred on this date    FUNCTIONAL OUTCOME MEASURES   Results Comments  BERG 49/56 Fall risk, in need of intervention          TUG 17.35econds No AD   5TSTS  20.03 seconds No UE support  6 Minute Walk Test    10 Meter Gait Speed Self-selected: 14.33s = 0.7 m/s Below normative values for full community ambulation            ASSESSMENT Clinical Impression: Pt is a 55 pleasant year-old female/female referred with diagnosis of generalized weakness. Patient lives alone in a 2 story home with bedroom/bathroom on 2nd floor. She denies any pain or falls but states she has become progressively weak. She presents with functional impairments as identified in PT exam today including LE muscle weakness, Impaired balance, decreased functional endurance and presents at increased risk of falling. Patient will benefit from skilled PT services to address deficits in strength and  balance for improved independence and functional capabilities in home and communitydecrease with decreased risk for future falls.                     Objective measurements completed on examination: See above findings.  PT Education - 02/17/21 1724    Education Details Plan of care and purpose of PT services in relation to addressing weakness    Person(s) Educated Patient    Methods Explanation    Comprehension Verbalized understanding            PT Short Term Goals - 02/17/21 1728      PT SHORT TERM GOAL #1   Title Pt will be independent with HEP in order to improve strength and balance in  order to decrease fall risk and improve function at home and work.    Baseline 02/17/2021: Patient reports she has not been performing any exercises in the home.    Time 6    Period Weeks    Status New    Target Date 03/31/21             PT Long Term Goals - 02/17/21 1729      PT LONG TERM GOAL #1   Title Patient will increase 10 meter walk test to >1.41m/s as to improve gait speed for better community ambulation and to reduce fall risk.    Baseline 02/17/2021= 0.7 m/s without an AD with mild unsteadiness    Time 12    Period Weeks    Status New    Target Date 05/12/21      PT LONG TERM GOAL #2   Title Pt will improve BERG by at least 3 points in order to demonstrate clinically significant improvement in balance.    Baseline 02/17/2021= 49/56    Time 12    Period Weeks    Status New    Target Date 05/12/21      PT LONG TERM GOAL #3   Title Pt will improve FOTO to target score of 56/100 to display perceived improvements in ability to complete ADL's.    Baseline 02/17/2021= 45/100    Time 12    Period Weeks    Status New    Target Date 05/12/21      PT LONG TERM GOAL #4   Title Pt will decrease TUG to below 14 seconds/decrease in order to demonstrate decreased fall risk.    Baseline 02/17/2021= 17.35 sec without an AD    Time 12    Period Weeks    Status New    Target Date 05/12/21      PT LONG TERM GOAL #5   Title Pt will decrease 5TSTS by at least 3 seconds in order to demonstrate clinically significant improvement in LE strength.    Baseline 02/17/2021= 20.03 sec without UE support    Time 12    Period Weeks    Status New    Target Date 05/12/21                  Plan - 02/17/21 1726    Clinical Impression Statement Pt is a 15 pleasant year-old female/female referred with diagnosis of generalized weakness. Patient lives alone in a 2 story home with bedroom/bathroom on 2nd floor. She denies any pain or falls but states she has become progressively weak. She presents  with functional impairments as identified in PT exam today including LE muscle weakness, Impaired balance, decreased functional endurance and presents at increased risk of falling. Patient will benefit from skilled PT services to address deficits in strength and  balance for improved independence and functional capabilities in home and communitydecrease with decreased risk for future falls.    Personal Factors and Comorbidities Age;Comorbidity 3+  Comorbidities COPD/Diastolic CHF, HTN, high fall risk, former smoker,    Examination-Activity Limitations Carry;Lift;Locomotion Level;Squat;Stairs;Stand;Transfers    Examination-Participation Restrictions Church;Cleaning;Community Activity;Shop;Volunteer;Laundry;Yard Work    Stability/Clinical Decision Making Stable/Uncomplicated    Surveyor, mining    Rehab Potential Good    PT Frequency 2x / week    PT Duration 12 weeks    PT Treatment/Interventions ADLs/Self Care Home Management;Cryotherapy;Moist Heat;Gait training;Stair training;Functional mobility training;Therapeutic activities;Therapeutic exercise;Balance training;Neuromuscular re-education;Patient/family education;Energy conservation;DME Instruction;Manual techniques;Passive range of motion    PT Next Visit Plan initiate HEP    PT Home Exercise Plan will address next visit    Consulted and Agree with Plan of Care Patient           Patient will benefit from skilled therapeutic intervention in order to improve the following deficits and impairments:  Decreased balance,Decreased mobility,Difficulty walking,Cardiopulmonary status limiting activity,Decreased activity tolerance,Decreased strength,Decreased endurance,Decreased coordination,Hypomobility  Visit Diagnosis: Abnormality of gait and mobility  Difficulty in walking, not elsewhere classified  Muscle weakness (generalized)     Problem List Patient Active Problem List   Diagnosis Date Noted  . Hyperparathyroidism due  to renal insufficiency ([place]) 02/02/2021  . CKD (chronic kidney disease) stage 4, GFR 15-29 ml/min (HCC) 01/15/2021  . Grief 04/08/2020  . History of esophageal stricture 04/08/2020  . Leg swelling 03/09/2020  . Left-sided chest wall pain 01/10/2020  . Anorexia 01/10/2020  . B12 deficiency 01/09/2020  . Generalized weakness 07/14/2019  . Mild malnutrition ([place]) 03/25/2019  . Diastolic CHF, chronic ([place]) 03/25/2019  . Vitamin D deficiency 03/25/2019  . Melena 02/03/2018  . S/P excision of skin lesion, follow-up exam 12/26/2017  . Impingement syndrome of left shoulder region 07/04/2017  . Neck pain 07/04/2017  . Allergic rhinitis 12/31/2016  . SCC (squamous cell carcinoma), face 08/01/2016  . Osteoporosis of forearm 02/13/2016  . Dizziness and giddiness 02/13/2016  . Insomnia secondary to anxiety 07/12/2015  . Anxiety disorder 07/12/2015  . Essential (primary) hypertension 10/07/2014  . Sinus bradycardia   . Hardening of the aorta (main artery of the heart) ([place]) 06/26/2014  . History of Clostridium difficile colitis 10/20/2013  . Personal history of other diseases of the digestive system 10/20/2013  . Low back pain 10/04/2013  . Encounter for general adult medical examination without abnormal findings 02/07/2013  . Chest pain at rest 12/26/2011  . COPD, moderate ([place]) 12/05/2011  . Presence of aortocoronary bypass graft   . Stage 3b chronic kidney disease ([place]) 09/25/2011  . Hyperlipidemia   . Hypertension   . SOB (shortness of breath)   . Coronary artery disease of native artery of native heart with stable angina pectoris ([place])   . Carotid artery disease ([place])     Lewis Moccasin, PT 02/18/2021, 7:49 AM  [place] MAIN [facility] SERVICES [address] [place], [place], [address] Phone: [phone]   Fax:  [phone]  Name: [name] MRN: [id number] Date of Birth: 09-16-1926

## 2021-02-18 ENCOUNTER — Telehealth: Payer: Self-pay

## 2021-02-18 NOTE — Chronic Care Management (AMB) (Signed)
  Chronic Care Management   Note  02/18/2021 Name: [name] MRN: [id number] DOB: 01-06-1926  [name] is a 87 y.o. year old female who is a primary care patient of Derrel Nip, Aris Everts, MD. [name] is currently enrolled in care management services. An additional referral for RN CM  was placed.   Follow up plan: Unsuccessful telephone outreach attempt made. A HIPAA compliant phone message was left for the patient providing contact information and requesting a return call.  The care management team will reach out to the patient again over the next 7 days.  If patient returns call to provider office, please advise to call [facility]  at [place], [place], [place], [place] [address] Direct Dial: [phone] [email or web address]

## 2021-02-19 NOTE — Chronic Care Management (AMB) (Signed)
  Chronic Care Management   Note  02/19/2021 Name: [name] MRN: [id number] DOB: 1926-06-21  [name] is a 87 y.o. year old female who is a primary care patient of Derrel Nip, Aris Everts, MD. [name] is currently enrolled in care management services. An additional referral for RN CM  was placed.   Follow up plan: Telephone appointment with care management team member scheduled for:03/15/2021  Noreene Larsson, [place], [place], [place] [address] Direct Dial: [phone] [email or web address]: [email or web address]

## 2021-02-23 ENCOUNTER — Other Ambulatory Visit: Payer: Self-pay

## 2021-02-23 ENCOUNTER — Ambulatory Visit: Payer: Medicare Other

## 2021-02-23 DIAGNOSIS — R278 Other lack of coordination: Secondary | ICD-10-CM | POA: Diagnosis not present

## 2021-02-23 DIAGNOSIS — R2689 Other abnormalities of gait and mobility: Secondary | ICD-10-CM

## 2021-02-23 DIAGNOSIS — R262 Difficulty in walking, not elsewhere classified: Secondary | ICD-10-CM | POA: Diagnosis not present

## 2021-02-23 DIAGNOSIS — R269 Unspecified abnormalities of gait and mobility: Secondary | ICD-10-CM | POA: Diagnosis not present

## 2021-02-23 DIAGNOSIS — R531 Weakness: Secondary | ICD-10-CM | POA: Diagnosis not present

## 2021-02-23 DIAGNOSIS — R2681 Unsteadiness on feet: Secondary | ICD-10-CM

## 2021-02-23 DIAGNOSIS — M6281 Muscle weakness (generalized): Secondary | ICD-10-CM | POA: Diagnosis not present

## 2021-02-23 NOTE — Therapy (Signed)
[name] [facility] SERVICES [address] [place], [place], [address] Phone: [phone]   Fax:  [phone]  Physical Therapy Treatment  Patient Details  Name: [name] MRN: [id number] Date of Birth: 14-Jul-1926 Referring Provider (PT): Derrel Nip MD   Encounter Date: 02/23/2021   PT End of Session - 02/23/21 1241    Visit Number 2    Number of Visits 25    Date for PT Re-Evaluation 05/12/21    Authorization Time Period INITIAL EVAL = 02/17/2021    PT Start Time 1151    PT Stop Time 1230    PT Time Calculation (min) 39 min    Equipment Utilized During Treatment Gait belt    Activity Tolerance Patient tolerated treatment well    Behavior During Therapy [facility] for tasks assessed/performed           Past Medical History:  Diagnosis Date  . C. difficile colitis   . CAD (coronary artery disease)    a. s/p CABG 1999; b. [place] 10/11, no scar or ischemia, EF 68%; b. [place] 06/18/14: no evidence of infarction or ischemia, EF 77%, low risk study; c. 02/2019 MV: EF 56%, ? mild lat ischemia->low risk.  . Carotid artery disease ([place])    a. Doppler January, 2012, 40-59% bilateral; b. 02/2017 U/S: 1-39% bilat ICA stenosis.  . Closed fracture pubis ([place]) 07/04/2017  . COPD (chronic obstructive pulmonary disease) ([place])   . Diffuse cystic mastopathy   . Diverticulitis    last colonoscopy  incomplete Jan 2011  . Dizziness    stable now (Oct 2011) patient tells me question of TIA, we will obtain records  . Gait abnormality    Patient complains of "wobbly gait", December, 2013  . GERD (gastroesophageal reflux disease)   . History of migraines   . Hx of CABG    a. 3 vessel in 1999  . Hyperlipidemia   . Hypertension   . Indigestion    3 weeks, October 2011  . Kidney stones   . Rheumatic fever   . Sinus bradycardia    Mild, December, 2013  . SOB (shortness of breath)   . Syncopal episodes    after 18 holes of golf and increase  hydrochlorothiazide, resolved     Past Surgical History:  Procedure Laterality Date  . APPENDECTOMY  1950  . BREAST BIOPSY Right 1994  . BREAST CYST ASPIRATION     multiple BIL  . CATARACT EXTRACTION Right 2009  . CATARACT EXTRACTION Left 2011  . COLONOSCOPY  [id number]   Dr. Jamal Collin  . CORONARY ARTERY BYPASS GRAFT  1999  . esophagus stretched    . TONSILLECTOMY  1939  . TOTAL ABDOMINAL HYSTERECTOMY  1968   due to metrorrhagia    There were no vitals filed for this visit.   Subjective Assessment - 02/23/21 1154    Subjective Pt presents with large bandage over RLE where pt reports 5 days ago she dropped a picture that "scooted down my leg and laid the skin back." Pt reports no pain currently. Pt reports she forgot to bring inhaler today.    Patient is accompained by: Family member   Daughter   Pertinent History Patient is a 87 year old female with referral from PCP for progressive weakness. Patient reports she is still mostly independent just moving around slower and not as active. She states she lives alone in a 2 story home without an AD for mobility. She reports her bedroom  and bathroom are on 2nd level and that she has to negotiate steps with some difficulty. Past medical history is significant for HTN, CAD, Hardening of Aorta, Sinus Bradycardia, Diastolic CHF, COPD, Hyperparathyroidism, Osteoporosis, CKD- Stage 3, Hyperlipidemia, LBP, COVID- Dec 2021.    Limitations Lifting;Standing;Walking;House hold activities    How long can you sit comfortably? NA    How long can you stand comfortably? maybe 5-10 min    How long can you walk comfortably? < 500 feet probably    Diagnostic tests NA    Patient Stated Goals I want to get my legs stronger    Currently in Pain? No/denies           TREATMENT  Therex-  Hip flexion 10x no AW, 15x with 3# AW (placed just above knees), progressed to 5# AW (placed just above knees), pt states "not easy, but not real hard"  STS - 3x10;  HR 95  bpm, SPO2% 97 bpm, prolonged rest break due to pt reporting feeling out breath   Hip internal rotation 2x10 each LE with RTB, hands-on assist for first ten reps for technique; pt rates medium Prolonged rest break  Hip abduction 2x15 each LE with RTB; pt rates medium  Standing abduction in  // bars 2x10 each LE  Standing Hip adduction 2x10 each LE  Heel raies 15x, 20x each LE    Access Code: [email or web address] URL: [email or web address] Date: 02/23/2021 Prepared by: Ricard Dillon  Exercises Sit to Stand with Counter Support - 1 x daily - 4 x weekly - 3 sets - 10 reps Seated March with Resistance - 1 x daily - 4 x weekly - 3 sets - 10 reps Seated Hip Abduction with Resistance - 1 x daily - 4 x weekly - 3 sets - 10 reps    Pt educated throughout session about proper posture and technique with exercises. Improved exercise technique, movement at target joints, use of target muscles after min to mod verbal, visual, tactile cues.      PT Short Term Goals - 02/17/21 1728      PT SHORT TERM GOAL #1   Title Pt will be independent with HEP in order to improve strength and balance in order to decrease fall risk and improve function at home and work.    Baseline 02/17/2021: Patient reports she has not been performing any exercises in the home.    Time 6    Period Weeks    Status New    Target Date 03/31/21             PT Long Term Goals - 02/17/21 1729      PT LONG TERM GOAL #1   Title Patient will increase 10 meter walk test to >1.105m/s as to improve gait speed for better community ambulation and to reduce fall risk.    Baseline 02/17/2021= 0.7 m/s without an AD with mild unsteadiness    Time 12    Period Weeks    Status New    Target Date 05/12/21      PT LONG TERM GOAL #2   Title Pt will improve BERG by at least 3 points in order to demonstrate clinically significant improvement in balance.    Baseline 02/17/2021= 49/56    Time 12    Period Weeks    Status New    Target  Date 05/12/21      PT LONG TERM GOAL #3   Title Pt will improve FOTO to target score of 56/100 to  display perceived improvements in ability to complete ADL's.    Baseline 02/17/2021= 45/100    Time 12    Period Weeks    Status New    Target Date 05/12/21      PT LONG TERM GOAL #4   Title Pt will decrease TUG to below 14 seconds/decrease in order to demonstrate decreased fall risk.    Baseline 02/17/2021= 17.35 sec without an AD    Time 12    Period Weeks    Status New    Target Date 05/12/21      PT LONG TERM GOAL #5   Title Pt will decrease 5TSTS by at least 3 seconds in order to demonstrate clinically significant improvement in LE strength.    Baseline 02/17/2021= 20.03 sec without UE support    Time 12    Period Weeks    Status New    Target Date 05/12/21                 Plan - 02/23/21 1238    Clinical Impression Statement Pt highly motivated throughout session. Pt is without inhaler today so she required prolonged rest breaks between sets, however SPO2% remains 97% or greater with exercise. HEP issued this session (see note). The pt will benefit from further skilled PT to improve BLE strength and encurance to decrease fall risk.    Personal Factors and Comorbidities Age;Comorbidity 3+    Comorbidities COPD/Diastolic CHF, HTN, high fall risk, former smoker,    Examination-Activity Limitations Carry;Lift;Locomotion Level;Squat;Stairs;Stand;Transfers    Examination-Participation Restrictions Church;Cleaning;Community Activity;Shop;Volunteer;Laundry;Yard Work    Stability/Clinical Decision Making Stable/Uncomplicated    Rehab Potential Good    PT Frequency 2x / week    PT Duration 12 weeks    PT Treatment/Interventions ADLs/Self Care Home Management;Cryotherapy;Moist Heat;Gait training;Stair training;Functional mobility training;Therapeutic activities;Therapeutic exercise;Balance training;Neuromuscular re-education;Patient/family education;Energy conservation;DME  Instruction;Manual techniques;Passive range of motion    PT Next Visit Plan initiate HEP    PT Home Exercise Plan Access Code: [email or web address]    Consulted and Agree with Plan of Care Patient           Patient will benefit from skilled therapeutic intervention in order to improve the following deficits and impairments:  Decreased balance,Decreased mobility,Difficulty walking,Cardiopulmonary status limiting activity,Decreased activity tolerance,Decreased strength,Decreased endurance,Decreased coordination,Hypomobility  Visit Diagnosis: Muscle weakness (generalized)  Other abnormalities of gait and mobility  Unsteadiness on feet     Problem List Patient Active Problem List   Diagnosis Date Noted  . Hyperparathyroidism due to renal insufficiency ([place]) 02/02/2021  . CKD (chronic kidney disease) stage 4, GFR 15-29 ml/min (HCC) 01/15/2021  . Grief 04/08/2020  . History of esophageal stricture 04/08/2020  . Leg swelling 03/09/2020  . Left-sided chest wall pain 01/10/2020  . Anorexia 01/10/2020  . B12 deficiency 01/09/2020  . Generalized weakness 07/14/2019  . Mild malnutrition ([place]) 03/25/2019  . Diastolic CHF, chronic ([place]) 03/25/2019  . Vitamin D deficiency 03/25/2019  . Melena 02/03/2018  . S/P excision of skin lesion, follow-up exam 12/26/2017  . Impingement syndrome of left shoulder region 07/04/2017  . Neck pain 07/04/2017  . Allergic rhinitis 12/31/2016  . SCC (squamous cell carcinoma), face 08/01/2016  . Osteoporosis of forearm 02/13/2016  . Dizziness and giddiness 02/13/2016  . Insomnia secondary to anxiety 07/12/2015  . Anxiety disorder 07/12/2015  . Essential (primary) hypertension 10/07/2014  . Sinus bradycardia   . Hardening of the aorta (Allen artery of the heart) ([place]) 06/26/2014  . History of Clostridium difficile colitis 10/20/2013  .  Personal history of other diseases of the digestive system 10/20/2013  . Low back pain 10/04/2013  . Encounter for general adult  medical examination without abnormal findings 02/07/2013  . Chest pain at rest 12/26/2011  . COPD, moderate ([place]) 12/05/2011  . Presence of aortocoronary bypass graft   . Stage 3b chronic kidney disease ([place]) 09/25/2011  . Hyperlipidemia   . Hypertension   . SOB (shortness of breath)   . Coronary artery disease of native artery of native heart with stable angina pectoris ([place])   . Carotid artery disease ([place])    Ricard Dillon PT, DPT 02/23/2021, 12:45 PM  [place] Allen [facility] SERVICES [address] [place], [place], [address] Phone: [phone]   Fax:  [phone]  Name: BIJOU EASLER MRN: [id number] Date of Birth: 1925/12/06

## 2021-02-25 ENCOUNTER — Ambulatory Visit: Payer: Medicare Other

## 2021-02-25 DIAGNOSIS — L089 Local infection of the skin and subcutaneous tissue, unspecified: Secondary | ICD-10-CM | POA: Diagnosis not present

## 2021-03-01 ENCOUNTER — Ambulatory Visit: Payer: Medicare Other

## 2021-03-01 ENCOUNTER — Other Ambulatory Visit: Payer: Self-pay

## 2021-03-01 DIAGNOSIS — M6281 Muscle weakness (generalized): Secondary | ICD-10-CM

## 2021-03-01 DIAGNOSIS — R269 Unspecified abnormalities of gait and mobility: Secondary | ICD-10-CM | POA: Diagnosis not present

## 2021-03-01 DIAGNOSIS — R531 Weakness: Secondary | ICD-10-CM | POA: Diagnosis not present

## 2021-03-01 DIAGNOSIS — S00432A Contusion of left ear, initial encounter: Secondary | ICD-10-CM | POA: Diagnosis not present

## 2021-03-01 DIAGNOSIS — R2689 Other abnormalities of gait and mobility: Secondary | ICD-10-CM | POA: Diagnosis not present

## 2021-03-01 DIAGNOSIS — R262 Difficulty in walking, not elsewhere classified: Secondary | ICD-10-CM | POA: Diagnosis not present

## 2021-03-01 DIAGNOSIS — R278 Other lack of coordination: Secondary | ICD-10-CM | POA: Diagnosis not present

## 2021-03-01 NOTE — Therapy (Signed)
[place] MAIN [facility] SERVICES [address] [place], [place], [address] Phone: [phone]   Fax:  [phone]  Physical Therapy Treatment  Patient Details  Name: [name] MRN: [id number] Date of Birth: 1925/09/25 Referring Provider (PT): Derrel Nip MD   Encounter Date: 03/01/2021   PT End of Session - 03/01/21 1351     Visit Number 3    Number of Visits 25    Date for PT Re-Evaluation 05/12/21    Authorization Time Period INITIAL EVAL = 02/17/2021    PT Start Time 1345    PT Stop Time 1429    PT Time Calculation (min) 44 min    Equipment Utilized During Treatment Gait belt    Activity Tolerance Patient tolerated treatment well    Behavior During Therapy WFL for tasks assessed/performed             Past Medical History:  Diagnosis Date   C. difficile colitis    CAD (coronary artery disease)    a. s/p CABG 1999; b. Nuclear 10/11, no scar or ischemia, EF 68%; b. [place] 06/18/14: no evidence of infarction or ischemia, EF 77%, low risk study; c. 02/2019 MV: EF 56%, ? mild lat ischemia->low risk.   Carotid artery disease ([place])    a. Doppler January, 2012, 40-59% bilateral; b. 02/2017 U/S: 1-39% bilat ICA stenosis.   Closed fracture pubis ([place]) 07/04/2017   COPD (chronic obstructive pulmonary disease) (HCC)    Diffuse cystic mastopathy    Diverticulitis    last colonoscopy  incomplete Jan 2011   Dizziness    stable now (Oct 2011) patient tells me question of TIA, we will obtain records   Gait abnormality    Patient complains of "wobbly gait", December, 2013   GERD (gastroesophageal reflux disease)    History of migraines    Hx of CABG    a. 3 vessel in 1999   Hyperlipidemia    Hypertension    Indigestion    3 weeks, October 2011   Kidney stones    Rheumatic fever    Sinus bradycardia    Mild, December, 2013   SOB (shortness of breath)    Syncopal episodes    after 18 holes of golf and increase  hydrochlorothiazide, resolved     Past Surgical History:  Procedure Laterality Date   [place]     multiple BIL   CATARACT EXTRACTION Right 2009   CATARACT EXTRACTION Left 2011   COLONOSCOPY  [id number]   Dr. Jamal Collin   CORONARY ARTERY BYPASS GRAFT  1999   esophagus stretched     [place]   due to metrorrhagia    There were no vitals filed for this visit.   Subjective Assessment - 03/01/21 1350     Subjective Pt reports she is now taking an antibiotic for her leg wound    Patient is accompained by: Family member   Daughter   Pertinent History Patient is a 87 year old female with referral from PCP for progressive weakness. Patient reports she is still mostly independent just moving around slower and not as active. She states she lives alone in a 2 story home without an AD for mobility. She reports her bedroom and bathroom are on 2nd level and that she has to negotiate steps with some difficulty. Past medical history is significant for HTN, CAD,  Hardening of Aorta, Sinus Bradycardia, Diastolic CHF, COPD, Hyperparathyroidism, Osteoporosis, CKD- Stage 3, Hyperlipidemia, LBP, COVID- Dec 2021.    Limitations Lifting;Standing;Walking;House hold activities    How long can you sit comfortably? NA    How long can you stand comfortably? maybe 5-10 min    How long can you walk comfortably? < 500 feet probably    Diagnostic tests NA    Patient Stated Goals I want to get my legs stronger    Currently in Pain? No/denies            Interventions:   Neuromuscular re-education in //bars:    Static stand on 1/2 white foam roll- (Curved side up)- hold 20 sec x 2 sets   Static stand on 1/2 white foam roll- (Curved side down )-mild increase in unsteadiness yet able to continue x 20 sec x 2 sets without UE support.     Static stand on 1/2 foam roll (curved side up)- rhomberg position  with eyes open- hold 20 sec x 2 sec- Patient using ankle strategy.  Static stand on 1/2 foam roll (Curved side down) - rhomberg position with eyes open- mild difficulty but able to hold 20 sec x 3 trials   Tandem stance on curve side up on 1/2 white foam roll- hold 10-20 sec x 3 trials.  *Increased difficulty with curve side down- hold - 20 sec x 3    Static stand with feet apart and Lift left arm(shoulder length high) and then add trunk rotation left to right x 5 x 2 sets- Mild difficulty with coordinating movement- improved with 2nd set.      Blue airex pad- minisquats- 10 reps with feet apart and then 2nd set in staggered position.    Golfers lean to pick up cone with 1 UE support on // bar x 10 each LE. Patient able to complete but reports very fatigued.   Step up onto 6" step without UE support, CGA and no LOB.   Education provided throughout session via VC/TC and demonstration to facilitate movement at target joints and correct muscle activation for all testing and exercises performed.   Clinical Impression: Patient challenged today with balance activities - increased unsteadiness with higher level balance activities including curved side down activities and minisquats - transitioned to airex pad to accomplish tasks. The pt will benefit from further skilled PT to improve BLE strength and encurance to decrease fall risk.                             PT Education - 03/01/21 1350     Education Details Exercise form    Person(s) Educated Patient    Methods Explanation;Demonstration;Tactile cues;Verbal cues    Comprehension Returned demonstration;Verbal cues required;Tactile cues required              PT Short Term Goals - 02/17/21 1728       PT SHORT TERM GOAL #1   Title Pt will be independent with HEP in order to improve strength and balance in order to decrease fall risk and improve function at home and work.    Baseline 02/17/2021: Patient reports  she has not been performing any exercises in the home.    Time 6    Period Weeks    Status New    Target Date 03/31/21               PT Long Term Goals - 02/17/21 1729  PT LONG TERM GOAL #1   Title Patient will increase 10 meter walk test to >1.63m/s as to improve gait speed for better community ambulation and to reduce fall risk.    Baseline 02/17/2021= 0.7 m/s without an AD with mild unsteadiness    Time 12    Period Weeks    Status New    Target Date 05/12/21      PT LONG TERM GOAL #2   Title Pt will improve BERG by at least 3 points in order to demonstrate clinically significant improvement in balance.    Baseline 02/17/2021= 49/56    Time 12    Period Weeks    Status New    Target Date 05/12/21      PT LONG TERM GOAL #3   Title Pt will improve FOTO to target score of 56/100 to display perceived improvements in ability to complete ADL's.    Baseline 02/17/2021= 45/100    Time 12    Period Weeks    Status New    Target Date 05/12/21      PT LONG TERM GOAL #4   Title Pt will decrease TUG to below 14 seconds/decrease in order to demonstrate decreased fall risk.    Baseline 02/17/2021= 17.35 sec without an AD    Time 12    Period Weeks    Status New    Target Date 05/12/21      PT LONG TERM GOAL #5   Title Pt will decrease 5TSTS by at least 3 seconds in order to demonstrate clinically significant improvement in LE strength.    Baseline 02/17/2021= 20.03 sec without UE support    Time 12    Period Weeks    Status New    Target Date 05/12/21                   Plan - 03/01/21 1403     Clinical Impression Statement Patient challenged today with balance activities - increased unsteadiness with higher level balance activities including curved side down activities and minisquats - transitioned to airex pad to accomplish tasks. The pt will benefit from further skilled PT to improve BLE strength and encurance to decrease fall risk.    Personal Factors and  Comorbidities Age;Comorbidity 3+    Comorbidities COPD/Diastolic CHF, HTN, high fall risk, former smoker,    Examination-Activity Limitations Carry;Lift;Locomotion Level;Squat;Stairs;Stand;Transfers    Examination-Participation Restrictions Church;Cleaning;Community Activity;Shop;Volunteer;Laundry;Yard Work    Stability/Clinical Decision Making Stable/Uncomplicated    Rehab Potential Good    PT Frequency 2x / week    PT Duration 12 weeks    PT Treatment/Interventions ADLs/Self Care Home Management;Cryotherapy;Moist Heat;Gait training;Stair training;Functional mobility training;Therapeutic activities;Therapeutic exercise;Balance training;Neuromuscular re-education;Patient/family education;Energy conservation;DME Instruction;Manual techniques;Passive range of motion    PT Next Visit Plan initiate HEP    PT Home Exercise Plan Access Code: [email or web address]    Consulted and Agree with Plan of Care Patient             Patient will benefit from skilled therapeutic intervention in order to improve the following deficits and impairments:  Decreased balance, Decreased mobility, Difficulty walking, Cardiopulmonary status limiting activity, Decreased activity tolerance, Decreased strength, Decreased endurance, Decreased coordination, Hypomobility  Visit Diagnosis: Abnormality of gait and mobility  Difficulty in walking, not elsewhere classified  Muscle weakness (generalized)  Other abnormalities of gait and mobility     Problem List Patient Active Problem List   Diagnosis Date Noted   Hyperparathyroidism due to renal insufficiency ([place]) 02/02/2021  CKD (chronic kidney disease) stage 4, GFR 15-29 ml/min (HCC) 01/15/2021   Grief 04/08/2020   History of esophageal stricture 04/08/2020   Leg swelling 03/09/2020   Left-sided chest wall pain 01/10/2020   Anorexia 01/10/2020   B12 deficiency 01/09/2020   Generalized weakness 07/14/2019   Mild malnutrition ([place]) 01/77/9390   Diastolic CHF, chronic  ([place]) 03/25/2019   Vitamin D deficiency 03/25/2019   Melena 02/03/2018   S/P excision of skin lesion, follow-up exam 12/26/2017   Impingement syndrome of left shoulder region 07/04/2017   Neck pain 07/04/2017   Allergic rhinitis 12/31/2016   SCC (squamous cell carcinoma), face 08/01/2016   Osteoporosis of forearm 02/13/2016   Dizziness and giddiness 02/13/2016   Insomnia secondary to anxiety 07/12/2015   Anxiety disorder 07/12/2015   Essential (primary) hypertension 10/07/2014   Sinus bradycardia    Hardening of the aorta (main artery of the heart) ([place]) 06/26/2014   History of Clostridium difficile colitis 10/20/2013   Personal history of other diseases of the digestive system 10/20/2013   Low back pain 10/04/2013   Encounter for general adult medical examination without abnormal findings 02/07/2013   Chest pain at rest 12/26/2011   COPD, moderate ([place]) 12/05/2011   Presence of aortocoronary bypass graft    Stage 3b chronic kidney disease ([place]) 09/25/2011   Hyperlipidemia    Hypertension    SOB (shortness of breath)    Coronary artery disease of native artery of native heart with stable angina pectoris ([place])    Carotid artery disease ([place])     Lewis Moccasin, PT 03/02/2021, 5:50 PM  [place] [facility] MAIN [facility] SERVICES [address] [place], [place], [address] Phone: [phone]   Fax:  [phone]  Name: [name] MRN: [id number] Date of Birth: May 16, 1926

## 2021-03-02 ENCOUNTER — Telehealth: Payer: Self-pay | Admitting: Internal Medicine

## 2021-03-02 DIAGNOSIS — R682 Dry mouth, unspecified: Secondary | ICD-10-CM | POA: Diagnosis not present

## 2021-03-02 DIAGNOSIS — H748X9 Other specified disorders of middle ear and mastoid, unspecified ear: Secondary | ICD-10-CM | POA: Diagnosis not present

## 2021-03-02 DIAGNOSIS — R070 Pain in throat: Secondary | ICD-10-CM | POA: Diagnosis not present

## 2021-03-02 NOTE — Telephone Encounter (Signed)
"  Rejection Reason - Other - patient cancelled but did not wish to reschedule" [name] said on Mar 01, 2021 12:30 PM  Pt was on 02/17/2021 10:20am  Msg from [facility] [place] [facility]

## 2021-03-04 ENCOUNTER — Ambulatory Visit: Payer: Medicare Other

## 2021-03-05 DIAGNOSIS — R202 Paresthesia of skin: Secondary | ICD-10-CM | POA: Diagnosis not present

## 2021-03-08 ENCOUNTER — Ambulatory Visit: Payer: Medicare Other

## 2021-03-08 ENCOUNTER — Other Ambulatory Visit: Payer: Self-pay

## 2021-03-08 DIAGNOSIS — R2689 Other abnormalities of gait and mobility: Secondary | ICD-10-CM | POA: Diagnosis not present

## 2021-03-08 DIAGNOSIS — R262 Difficulty in walking, not elsewhere classified: Secondary | ICD-10-CM

## 2021-03-08 DIAGNOSIS — R531 Weakness: Secondary | ICD-10-CM | POA: Diagnosis not present

## 2021-03-08 DIAGNOSIS — R278 Other lack of coordination: Secondary | ICD-10-CM | POA: Diagnosis not present

## 2021-03-08 DIAGNOSIS — M6281 Muscle weakness (generalized): Secondary | ICD-10-CM | POA: Diagnosis not present

## 2021-03-08 DIAGNOSIS — R269 Unspecified abnormalities of gait and mobility: Secondary | ICD-10-CM | POA: Diagnosis not present

## 2021-03-08 NOTE — Therapy (Signed)
[place] MAIN [facility] SERVICES [address] [place], [place], [address] Phone: [phone]   Fax:  [phone]  Physical Therapy Treatment  Patient Details  Name: [name] MRN: [id number] Date of Birth: [birth year] Referring Provider (PT): Derrel Nip MD   Encounter Date: 03/08/2021   PT End of Session - 03/08/21 1546     Visit Number 4    Number of Visits 25    Date for PT Re-Evaluation 05/12/21    Authorization Time Period INITIAL EVAL = 02/17/2021    PT Start Time 1355    PT Stop Time 1422    PT Time Calculation (min) 27 min    Equipment Utilized During Treatment Gait belt    Activity Tolerance Patient tolerated treatment well    Behavior During Therapy [facility] for tasks assessed/performed             Past Medical History:  Diagnosis Date   C. difficile colitis    CAD (coronary artery disease)    a. s/p CABG 1999; b. [place] 10/11, no scar or ischemia, EF 68%; b. [place] 06/18/14: no evidence of infarction or ischemia, EF 77%, low risk study; c. 02/2019 MV: EF 56%, ? mild lat ischemia->low risk.   Carotid artery disease ([place])    a. Doppler January, 2012, 40-59% bilateral; b. 02/2017 U/S: 1-39% bilat ICA stenosis.   Closed fracture pubis ([place]) 07/04/2017   COPD (chronic obstructive pulmonary disease) (HCC)    Diffuse cystic mastopathy    Diverticulitis    last colonoscopy  incomplete Jan 2011   Dizziness    stable now (Oct 2011) patient tells me question of TIA, we will obtain records   Gait abnormality    Patient complains of "wobbly gait", December, 2013   GERD (gastroesophageal reflux disease)    History of migraines    Hx of CABG    a. 3 vessel in 1999   Hyperlipidemia    Hypertension    Indigestion    3 weeks, October 2011   Kidney stones    Rheumatic fever    Sinus bradycardia    Mild, December, 2013   SOB (shortness of breath)    Syncopal episodes    after 18 holes of golf and increase  hydrochlorothiazide, resolved     Past Surgical History:  Procedure Laterality Date   [place]     multiple BIL   CATARACT EXTRACTION Right 2009   CATARACT EXTRACTION Left 2011   COLONOSCOPY  [id number]   Dr. Jamal Collin   CORONARY ARTERY BYPASS GRAFT  1999   esophagus stretched     [place]   due to metrorrhagia    There were no vitals filed for this visit.   Subjective Assessment - 03/08/21 1541     Subjective Patient reports she has been nauseated since last Friday but feeling better overall- mostly just weak    Patient is accompained by: Family member   Daughter   Pertinent History Patient is a 87 year old female with referral from PCP for progressive weakness. Patient reports she is still mostly independent just moving around slower and not as active. She states she lives alone in a 2 story home without an AD for mobility. She reports her bedroom and bathroom are on 2nd level and that she has to negotiate steps with some difficulty. Past medical history is  significant for HTN, CAD, Hardening of Aorta, Sinus Bradycardia, Diastolic CHF, COPD, Hyperparathyroidism, Osteoporosis, CKD- Stage 3, Hyperlipidemia, LBP, COVID- Dec 2021.    Limitations Lifting;Standing;Walking;House hold activities    How long can you sit comfortably? NA    How long can you stand comfortably? maybe 5-10 min    How long can you walk comfortably? < 500 feet probably    Diagnostic tests NA    Patient Stated Goals I want to get my legs stronger    Currently in Pain? No/denies           *Patient reported not feeling well - nauseated today so modified Treatment to within patient's tolerance and deferred dynamic balance exercises.   Seated therapeutic exercises:  Seated hip march- hold 2 sec BLE x 10 reps Hip add with ball squeeze - hold 5 sec x 10 reps Seated knee ext with 2 sec hold - x 10 reps  each leg Hip flex/abd up and over cone x 10 reps each LE x 2 sets Sit to stand with UE holding onto ball with outstretched hands x 10 reps.  Stand step tap on base of TR with BUE Support x 10 each LE.  - No loss of balance or exhibited difficulty. Education provided throughout session via VC/TC and demonstration to facilitate movement at target joints and correct muscle activation for all testing and exercises performed.   Patient became very fatigued and requested to terminate session.      Clinical Impression: Treatment limited secondary to patient not feeling well. She remained motivated despite not feeling well and requested to work on strengthening as much as possible. She was able to perform exercises with correct technique and good form yet fatigued very quickly and presents as self limiting today. She denied any SOB or any pain- just nauseated today and unable to progress. The pt will benefit from further skilled PT to improve BLE strength and encurance to decrease fall risk.                       PT Education - 03/08/21 1544     Education Details specific exercise technique.    Person(s) Educated Patient    Methods Explanation;Demonstration;Tactile cues;Verbal cues    Comprehension Verbalized understanding;Returned demonstration;Verbal cues required;Tactile cues required;Need further instruction              PT Short Term Goals - 02/17/21 1728       PT SHORT TERM GOAL #1   Title Pt will be independent with HEP in order to improve strength and balance in order to decrease fall risk and improve function at home and work.    Baseline 02/17/2021: Patient reports she has not been performing any exercises in the home.    Time 6    Period Weeks    Status New    Target Date 03/31/21               PT Long Term Goals - 02/17/21 1729       PT LONG TERM GOAL #1   Title Patient will increase 10 meter walk test to >1.39m/s as to improve gait speed for  better community ambulation and to reduce fall risk.    Baseline 02/17/2021= 0.7 m/s without an AD with mild unsteadiness    Time 12    Period Weeks    Status New    Target Date 05/12/21      PT LONG TERM GOAL #2  Title Pt will improve BERG by at least 3 points in order to demonstrate clinically significant improvement in balance.    Baseline 02/17/2021= 49/56    Time 12    Period Weeks    Status New    Target Date 05/12/21      PT LONG TERM GOAL #3   Title Pt will improve FOTO to target score of 56/100 to display perceived improvements in ability to complete ADL's.    Baseline 02/17/2021= 45/100    Time 12    Period Weeks    Status New    Target Date 05/12/21      PT LONG TERM GOAL #4   Title Pt will decrease TUG to below 14 seconds/decrease in order to demonstrate decreased fall risk.    Baseline 02/17/2021= 17.35 sec without an AD    Time 12    Period Weeks    Status New    Target Date 05/12/21      PT LONG TERM GOAL #5   Title Pt will decrease 5TSTS by at least 3 seconds in order to demonstrate clinically significant improvement in LE strength.    Baseline 02/17/2021= 20.03 sec without UE support    Time 12    Period Weeks    Status New    Target Date 05/12/21                   Plan - 03/08/21 1546     Clinical Impression Statement Treatment limited secondary to patient not feeling well. She remained motivated despite not feeling well and requested to work on strengthening as much as possible. She was able to perform exercises with correct technique and good form yet fatigued very quickly and presents as self limiting today. She denied any SOB or any pain- just nauseated today and unable to progress. The pt will benefit from further skilled PT to improve BLE strength and encurance to decrease fall risk.  ?    Personal Factors and Comorbidities Age;Comorbidity 3+    Comorbidities COPD/Diastolic CHF, HTN, high fall risk, former smoker,    Examination-Activity  Limitations Carry;Lift;Locomotion Level;Squat;Stairs;Stand;Transfers    Examination-Participation Restrictions Church;Cleaning;Community Activity;Shop;Volunteer;Laundry;Yard Work    Stability/Clinical Decision Making Stable/Uncomplicated    Rehab Potential Good    PT Frequency 2x / week    PT Duration 12 weeks    PT Treatment/Interventions ADLs/Self Care Home Management;Cryotherapy;Moist Heat;Gait training;Stair training;Functional mobility training;Therapeutic activities;Therapeutic exercise;Balance training;Neuromuscular re-education;Patient/family education;Energy conservation;DME Instruction;Manual techniques;Passive range of motion    PT Next Visit Plan Progress LE strengthening and balance activities as appropriate.    PT Home Exercise Plan --    Consulted and Agree with Plan of Care Patient             Patient will benefit from skilled therapeutic intervention in order to improve the following deficits and impairments:  Decreased balance, Decreased mobility, Difficulty walking, Cardiopulmonary status limiting activity, Decreased activity tolerance, Decreased strength, Decreased endurance, Decreased coordination, Hypomobility  Visit Diagnosis: Abnormality of gait and mobility  Difficulty in walking, not elsewhere classified  Muscle weakness (generalized)  Other lack of coordination     Problem List Patient Active Problem List   Diagnosis Date Noted   Hyperparathyroidism due to renal insufficiency ([place]) 02/02/2021   CKD (chronic kidney disease) stage 4, GFR 15-29 ml/min (HCC) 01/15/2021   Grief 04/08/2020   History of esophageal stricture 04/08/2020   Leg swelling 03/09/2020   Left-sided chest wall pain 01/10/2020   Anorexia 01/10/2020   B12  deficiency 01/09/2020   Generalized weakness 07/14/2019   Mild malnutrition ([place]) 82/42/3536   Diastolic CHF, chronic ([place]) 03/25/2019   Vitamin D deficiency 03/25/2019   Melena 02/03/2018   S/P excision of skin lesion,  follow-up exam 12/26/2017   Impingement syndrome of left shoulder region 07/04/2017   Neck pain 07/04/2017   Allergic rhinitis 12/31/2016   SCC (squamous cell carcinoma), face 08/01/2016   Osteoporosis of forearm 02/13/2016   Dizziness and giddiness 02/13/2016   Insomnia secondary to anxiety 07/12/2015   Anxiety disorder 07/12/2015   Essential (primary) hypertension 10/07/2014   Sinus bradycardia    Hardening of the aorta (main artery of the heart) ([place]) 06/26/2014   History of Clostridium difficile colitis 10/20/2013   Personal history of other diseases of the digestive system 10/20/2013   Low back pain 10/04/2013   Encounter for general adult medical examination without abnormal findings 02/07/2013   Chest pain at rest 12/26/2011   COPD, moderate ([place]) 12/05/2011   Presence of aortocoronary bypass graft    Stage 3b chronic kidney disease ([place]) 09/25/2011   Hyperlipidemia    Hypertension    SOB (shortness of breath)    Coronary artery disease of native artery of native heart with stable angina pectoris ([place])    Carotid artery disease ([place])     Lewis Moccasin, PT 03/09/2021, 4:31 PM  [place] MAIN [facility] SERVICES [address] [place], [place], [address] Phone: [phone]   Fax:  [phone]  Name: [name] MRN: [id number] Date of Birth: June 04, 1926

## 2021-03-11 ENCOUNTER — Other Ambulatory Visit: Payer: Self-pay

## 2021-03-11 ENCOUNTER — Other Ambulatory Visit: Payer: Self-pay | Admitting: Cardiovascular Disease

## 2021-03-11 ENCOUNTER — Ambulatory Visit: Payer: Medicare Other

## 2021-03-11 DIAGNOSIS — R262 Difficulty in walking, not elsewhere classified: Secondary | ICD-10-CM | POA: Diagnosis not present

## 2021-03-11 DIAGNOSIS — R531 Weakness: Secondary | ICD-10-CM | POA: Diagnosis not present

## 2021-03-11 DIAGNOSIS — R269 Unspecified abnormalities of gait and mobility: Secondary | ICD-10-CM

## 2021-03-11 DIAGNOSIS — M6281 Muscle weakness (generalized): Secondary | ICD-10-CM

## 2021-03-11 DIAGNOSIS — R278 Other lack of coordination: Secondary | ICD-10-CM

## 2021-03-11 DIAGNOSIS — R2689 Other abnormalities of gait and mobility: Secondary | ICD-10-CM | POA: Diagnosis not present

## 2021-03-11 NOTE — Therapy (Signed)
[place] MAIN [facility] SERVICES [address] [place], [place], [address] Phone: [phone]   Fax:  [phone]  Physical Therapy Treatment  Patient Details  Name: [name] MRN: [id number] Date of Birth: 09-27-1925 Referring Provider (PT): Derrel Nip MD   Encounter Date: 03/11/2021   PT End of Session - 03/11/21 1142     Visit Number 5    Number of Visits 25    Date for PT Re-Evaluation 05/12/21    Authorization Time Period INITIAL EVAL = 02/17/2021    PT Start Time 1100    PT Stop Time 1134    PT Time Calculation (min) 34 min    Equipment Utilized During Treatment Gait belt    Activity Tolerance Patient tolerated treatment well    Behavior During Therapy [facility] for tasks assessed/performed             Past Medical History:  Diagnosis Date   C. difficile colitis    CAD (coronary artery disease)    a. s/p CABG 1999; b. Nuclear 10/11, no scar or ischemia, EF 68%; b. [place] 06/18/14: no evidence of infarction or ischemia, EF 77%, low risk study; c. 02/2019 MV: EF 56%, ? mild lat ischemia->low risk.   Carotid artery disease ([place])    a. Doppler January, 2012, 40-59% bilateral; b. 02/2017 U/S: 1-39% bilat ICA stenosis.   Closed fracture pubis ([place]) 07/04/2017   COPD (chronic obstructive pulmonary disease) (HCC)    Diffuse cystic mastopathy    Diverticulitis    last colonoscopy  incomplete Jan 2011   Dizziness    stable now (Oct 2011) patient tells me question of TIA, we will obtain records   Gait abnormality    Patient complains of "wobbly gait", December, 2013   GERD (gastroesophageal reflux disease)    History of migraines    Hx of CABG    a. 3 vessel in 1999   Hyperlipidemia    Hypertension    Indigestion    3 weeks, October 2011   Kidney stones    Rheumatic fever    Sinus bradycardia    Mild, December, 2013   SOB (shortness of breath)    Syncopal episodes    after 18 holes of golf and increase  hydrochlorothiazide, resolved     Past Surgical History:  Procedure Laterality Date   [place]     multiple BIL   CATARACT EXTRACTION Right 2009   CATARACT EXTRACTION Left 2011   COLONOSCOPY  [id number]   Dr. Jamal Collin   CORONARY ARTERY BYPASS GRAFT  1999   esophagus stretched     [place]   due to metrorrhagia    There were no vitals filed for this visit.   Subjective Assessment - 03/11/21 1106     Subjective Patient reports feeling about the same- states she just does not have much energy.    Patient is accompained by: Family member   Daughter   Pertinent History Patient is a 87 year old female with referral from PCP for progressive weakness. Patient reports she is still mostly independent just moving around slower and not as active. She states she lives alone in a 2 story home without an AD for mobility. She reports her bedroom and bathroom are on 2nd level and that she has to negotiate steps with some difficulty. Past medical history is significant for  HTN, CAD, Hardening of Aorta, Sinus Bradycardia, Diastolic CHF, COPD, Hyperparathyroidism, Osteoporosis, CKD- Stage 3, Hyperlipidemia, LBP, COVID- Dec 2021.    Limitations Lifting;Standing;Walking;House hold activities    How long can you sit comfortably? NA    How long can you stand comfortably? maybe 5-10 min    How long can you walk comfortably? < 500 feet probably    Diagnostic tests NA    Patient Stated Goals I want to get my legs stronger    Currently in Pain? No/denies              Interventions:   Seated ham curl with GTB x 12 reps BLE   Instructed in standing LE strengthening exercises at support bar today:   Standing hip march Standing hip abd Standing hip ext Standing knee flex Standing Minisquats Standing Calf raises Standing toe raises  Education provided throughout session via VC/TC and  demonstration to facilitate movement at target joints and correct muscle activation for all testing and exercises performed.   Patient denied any pain but required several rest breaks to complete above therex today.   Clinical Impression: Patient stated she is doing "some" of the prescribed exercises but states still feeling weak so focused treatment today on Instructing/educating on LE strengthening for home program. She was able to perform several muscle groups and return demonstration well without significant difficulty other than fatigue. The pt will benefit from further skilled PT to improve BLE strength and encurance to decrease fall risk.    Access Code: [email or web address] URL: [email or web address] Date: 03/11/2021 Prepared by: Sande Brothers, PT  Exercises Standing March with Counter Support - 1 x daily - 3 x weekly - 3 sets - 10 reps - 2 hold Standing Hip Abduction with Counter Support - 1 x daily - 3 x weekly - 3 sets - 10 reps - 2 hold Standing Hip Extension with Counter Support - 1 x daily - 7 x weekly - 3 sets - 10 reps - 2 hold Mini Squat with Counter Support - 1 x daily - 3 x weekly - 3 sets - 10 reps - 2 hold Standing Knee Flexion with Counter Support - 1 x daily - 3 x weekly - 3 sets - 10 reps - 2 hold Heel rises with counter support - 1 x daily - 3 x weekly - 3 sets - 10 reps - 2 hold Standing Ankle Dorsiflexion with Table Support - 1 x daily - 3 x weekly - 3 sets - 10 reps - 2 hold                        PT Education - 03/11/21 1107     Education Details educated in exercise techniques    Person(s) Educated Patient    Methods Explanation;Demonstration;Tactile cues;Verbal cues    Comprehension Verbalized understanding;Returned demonstration;Verbal cues required;Tactile cues required;Need further instruction              PT Short Term Goals - 02/17/21 1728       PT SHORT TERM GOAL #1   Title Pt will be independent with HEP in order  to improve strength and balance in order to decrease fall risk and improve function at home and work.    Baseline 02/17/2021: Patient reports she has not been performing any exercises in the home.    Time 6    Period Weeks    Status New    Target Date 03/31/21  PT Long Term Goals - 02/17/21 1729       PT LONG TERM GOAL #1   Title Patient will increase 10 meter walk test to >1.28m/s as to improve gait speed for better community ambulation and to reduce fall risk.    Baseline 02/17/2021= 0.7 m/s without an AD with mild unsteadiness    Time 12    Period Weeks    Status New    Target Date 05/12/21      PT LONG TERM GOAL #2   Title Pt will improve BERG by at least 3 points in order to demonstrate clinically significant improvement in balance.    Baseline 02/17/2021= 49/56    Time 12    Period Weeks    Status New    Target Date 05/12/21      PT LONG TERM GOAL #3   Title Pt will improve FOTO to target score of 56/100 to display perceived improvements in ability to complete ADL's.    Baseline 02/17/2021= 45/100    Time 12    Period Weeks    Status New    Target Date 05/12/21      PT LONG TERM GOAL #4   Title Pt will decrease TUG to below 14 seconds/decrease in order to demonstrate decreased fall risk.    Baseline 02/17/2021= 17.35 sec without an AD    Time 12    Period Weeks    Status New    Target Date 05/12/21      PT LONG TERM GOAL #5   Title Pt will decrease 5TSTS by at least 3 seconds in order to demonstrate clinically significant improvement in LE strength.    Baseline 02/17/2021= 20.03 sec without UE support    Time 12    Period Weeks    Status New    Target Date 05/12/21                   Plan - 03/11/21 1141     Clinical Impression Statement Patient stated she is doing "some" of the prescribed exercises but states still feeling weak so focused treatment today on Instructing/educating on LE strengthening for home program. She was able to perform  several muscle groups and return demonstration well without significant difficulty other than fatigue. The pt will benefit from further skilled PT to improve BLE strength and encurance to decrease fall risk.    Personal Factors and Comorbidities Age;Comorbidity 3+    Comorbidities COPD/Diastolic CHF, HTN, high fall risk, former smoker,    Examination-Activity Limitations Carry;Lift;Locomotion Level;Squat;Stairs;Stand;Transfers    Examination-Participation Restrictions Church;Cleaning;Community Activity;Shop;Volunteer;Laundry;Yard Work    Stability/Clinical Decision Making Stable/Uncomplicated    Rehab Potential Good    PT Frequency 2x / week    PT Duration 12 weeks    PT Treatment/Interventions ADLs/Self Care Home Management;Cryotherapy;Moist Heat;Gait training;Stair training;Functional mobility training;Therapeutic activities;Therapeutic exercise;Balance training;Neuromuscular re-education;Patient/family education;Energy conservation;DME Instruction;Manual techniques;Passive range of motion    PT Next Visit Plan Progress LE strengthening and balance activities as appropriate.    PT Home Exercise Plan Access Code: [email or web address]  URL: [email or web address]    Consulted and Agree with Plan of Care Patient             Patient will benefit from skilled therapeutic intervention in order to improve the following deficits and impairments:  Decreased balance, Decreased mobility, Difficulty walking, Cardiopulmonary status limiting activity, Decreased activity tolerance, Decreased strength, Decreased endurance, Decreased coordination, Hypomobility  Visit Diagnosis: Abnormality of gait and mobility  Difficulty in walking, not elsewhere  classified  Muscle weakness (generalized)  Other lack of coordination     Problem List Patient Active Problem List   Diagnosis Date Noted   Hyperparathyroidism due to renal insufficiency ([place]) 02/02/2021   CKD (chronic kidney disease) stage 4, GFR  15-29 ml/min ([place]) 01/15/2021   Grief 04/08/2020   History of esophageal stricture 04/08/2020   Leg swelling 03/09/2020   Left-sided chest wall pain 01/10/2020   Anorexia 01/10/2020   B12 deficiency 01/09/2020   Generalized weakness 07/14/2019   Mild malnutrition ([place]) 88/41/6606   Diastolic CHF, chronic ([place]) 03/25/2019   Vitamin D deficiency 03/25/2019   Melena 02/03/2018   S/P excision of skin lesion, follow-up exam 12/26/2017   Impingement syndrome of left shoulder region 07/04/2017   Neck pain 07/04/2017   Allergic rhinitis 12/31/2016   SCC (squamous cell carcinoma), face 08/01/2016   Osteoporosis of forearm 02/13/2016   Dizziness and giddiness 02/13/2016   Insomnia secondary to anxiety 07/12/2015   Anxiety disorder 07/12/2015   Essential (primary) hypertension 10/07/2014   Sinus bradycardia    Hardening of the aorta (main artery of the heart) ([place]) 06/26/2014   History of Clostridium difficile colitis 10/20/2013   Personal history of other diseases of the digestive system 10/20/2013   Low back pain 10/04/2013   Encounter for general adult medical examination without abnormal findings 02/07/2013   Chest pain at rest 12/26/2011   COPD, moderate ([place]) 12/05/2011   Presence of aortocoronary bypass graft    Stage 3b chronic kidney disease ([place]) 09/25/2011   Hyperlipidemia    Hypertension    SOB (shortness of breath)    Coronary artery disease of native artery of native heart with stable angina pectoris ([place])    Carotid artery disease ([place])     Lewis Moccasin, PT 03/11/2021, 11:43 AM  [place] MAIN [facility] SERVICES [address] [place], [place], [address] Phone: [phone]   Fax:  [phone]  Name: [name] MRN: [id number] Date of Birth: [birth year]

## 2021-03-12 NOTE — Telephone Encounter (Signed)
Pt not taking Amlodipine 2.5 mg tablet.

## 2021-03-15 ENCOUNTER — Ambulatory Visit (INDEPENDENT_AMBULATORY_CARE_PROVIDER_SITE_OTHER): Payer: Medicare Other | Admitting: *Deleted

## 2021-03-15 DIAGNOSIS — I10 Essential (primary) hypertension: Secondary | ICD-10-CM

## 2021-03-15 DIAGNOSIS — J449 Chronic obstructive pulmonary disease, unspecified: Secondary | ICD-10-CM | POA: Diagnosis not present

## 2021-03-16 ENCOUNTER — Ambulatory Visit: Payer: Medicare Other

## 2021-03-16 NOTE — Chronic Care Management (AMB) (Signed)
Chronic Care Management   CCM RN Visit Note  03/16/2021 Name: [name] MRN: [id number] DOB: August 03, 1926  Subjective: [name] is a 87 y.o. year old female who is a primary care patient of Tullo, Aris Everts, MD. The care management team was consulted for assistance with disease management and care coordination needs.    Engaged with patient by telephone for initial visit in response to provider referral for case management and/or care coordination services.   Consent to Services:  The patient was given the following information about Chronic Care Management services today, agreed to services, and gave verbal consent: 1. CCM service includes personalized support from designated clinical staff supervised by the primary care provider, including individualized plan of care and coordination with other care providers 2. 24/7 contact phone numbers for assistance for urgent and routine care needs. 3. Service will only be billed when office clinical staff spend 20 minutes or more in a month to coordinate care. 4. Only one practitioner may furnish and bill the service in a calendar month. 5.The patient may stop CCM services at any time (effective at the end of the month) by phone call to the office staff. 6. The patient will be responsible for cost sharing (co-pay) of up to 20% of the service fee (after annual deductible is met). Patient agreed to services and consent obtained.  Patient agreed to services and verbal consent obtained.   Assessment: Review of patient past medical history, allergies, medications, health status, including review of consultants reports, laboratory and other test data, was performed as part of comprehensive evaluation and provision of chronic care management services.   SDOH (Social Determinants of Health) assessments and interventions performed:  SDOH Interventions    Flowsheet Row Most Recent Value  SDOH Interventions   Food Insecurity Interventions  Intervention Not Indicated  Housing Interventions Intervention Not Indicated  Intimate Partner Violence Interventions Intervention Not Indicated  Transportation Interventions Intervention Not Indicated        CCM Care Plan  Allergies  Allergen Reactions   Ciprofloxacin     ? Caused c diff     Outpatient Encounter Medications as of 03/15/2021  Medication Sig Note   atorvastatin (LIPITOR) 40 MG tablet TAKE ONE TABLET BY MOUTH EVERY DAY    clopidogrel (PLAVIX) 75 MG tablet TAKE 1 TABLET BY MOUTH DAILY    Cranberry 125 MG TABS Take 1 tablet by mouth daily.    esomeprazole (NEXIUM) 40 MG capsule Take 40 mg by mouth daily at 12 noon.    Ipratropium-Albuterol (COMBIVENT) 20-100 MCG/ACT AERS respimat Inhale 1 puff into the lungs every 6 (six) hours as needed for wheezing.    isosorbide mononitrate (IMDUR) 60 MG 24 hr tablet Take 1.5 tablets (90 mg total) by mouth 2 (two) times daily.    losartan (COZAAR) 100 MG tablet TAKE 1 TABLET BY MOUTH DAILY    nitroGLYCERIN (NITROSTAT) 0.4 MG SL tablet PLACE 1 TABLET UNDER TONGUE EVERY 5 MIN AS NEEDED FOR CHEST PAIN IF NO RELIEF IN15 MIN CALL 911 (MAX 3 TABS)    Probiotic Product (PROBIOTIC PO) Take 1 capsule by mouth daily.    ranolazine (RANEXA) 500 MG 12 hr tablet TAKE 1 TABLET BY MOUTH TWICE DAILY    vitamin B-12 (CYANOCOBALAMIN) 500 MCG tablet Take 500 mcg by mouth daily.    magic mouthwash (nystatin, lidocaine, diphenhydrAMINE, alum & mag hydroxide) suspension Mix ingredients 1:1:1:1 Swish 45m twice daily for th next 14 days (Patient not taking: Reported on 03/15/2021)  propranolol (INDERAL) 20 MG tablet TAKE ONE TABLET 3 TIMES DAILY AS NEEDED FOR TACHYCARDIA (Patient not taking: Reported on 02/09/2021) 03/15/2021: Hasn't needed in long time per patient   No facility-administered encounter medications on file as of 03/15/2021.    Patient Active Problem List   Diagnosis Date Noted   Hyperparathyroidism due to renal insufficiency ([place]) 02/02/2021    CKD (chronic kidney disease) stage 4, GFR 15-29 ml/min ([place]) 01/15/2021   Grief 04/08/2020   History of esophageal stricture 04/08/2020   Leg swelling 03/09/2020   Left-sided chest wall pain 01/10/2020   Anorexia 01/10/2020   B12 deficiency 01/09/2020   Generalized weakness 07/14/2019   Mild malnutrition ([place]) 50/38/8828   Diastolic CHF, chronic ([place]) 03/25/2019   Vitamin D deficiency 03/25/2019   Melena 02/03/2018   S/P excision of skin lesion, follow-up exam 12/26/2017   Impingement syndrome of left shoulder region 07/04/2017   Neck pain 07/04/2017   Allergic rhinitis 12/31/2016   SCC (squamous cell carcinoma), face 08/01/2016   Osteoporosis of forearm 02/13/2016   Dizziness and giddiness 02/13/2016   Insomnia secondary to anxiety 07/12/2015   Anxiety disorder 07/12/2015   Essential (primary) hypertension 10/07/2014   Sinus bradycardia    Hardening of the aorta (main artery of the heart) ([place]) 06/26/2014   History of Clostridium difficile colitis 10/20/2013   Personal history of other diseases of the digestive system 10/20/2013   Low back pain 10/04/2013   Encounter for general adult medical examination without abnormal findings 02/07/2013   Chest pain at rest 12/26/2011   COPD, moderate ([place]) 12/05/2011   Presence of aortocoronary bypass graft    Stage 3b chronic kidney disease ([place]) 09/25/2011   Hyperlipidemia    Hypertension    SOB (shortness of breath)    Coronary artery disease of native artery of native heart with stable angina pectoris ([place])    Carotid artery disease ([place])     Conditions to be addressed/monitored:HTN and COPD  Care Plan : COPD (Adult)  Updates made by Leona Singleton, RN since 03/16/2021 12:00 AM     Problem: Symptom Exacerbation (COPD)   Priority: Medium     Long-Range Goal: Symptom Exacerbation Prevented or Minimized   Start Date: 03/15/2021  Expected End Date: 09/17/2021  Priority: Medium  Note:   Current Barriers:  Knowledge  deficits related to basic understanding of COPD disease process Knowledge deficits related to basic COPD self care/management history of COPD; Denies current shortness of breath.  Uses Combivent rescue inhaler occasionally, last use a few weeks ago.  Was on Anora, intolerable causing mouth rash/thrush that has turned into dry mouth per patient.  Does report continued occasional lip swelling Case Manager Clinical Goal(s): patient will report using inhalers as prescribed including rinsing mouth after use patient will verbalize understanding of COPD action plan and when to seek appropriate levels of medical care patient will verbalize basic understanding of COPD disease process and self care activities  Interventions:  Collaboration with [name], MD regarding development and update of comprehensive plan of care as evidenced by provider attestation and co-signature Inter-disciplinary care team collaboration (see longitudinal plan of care) Provided patient with basic written and verbal COPD education on self care/management/and exacerbation prevention  Provided patient with COPD action plan and reinforced importance of daily self assessment Provided instruction about proper use of medications used for management of COPD including inhalers Advised patient to self assesses COPD action plan zone and make appointment with provider if in the yellow zone  for 48 hours without improvement. Provided education about and advised patient to utilize infection prevention strategies to reduce risk of respiratory infection  Barriers to lifestyle changes reviewed and addressed Barriers to treatment reviewed and addressed Healthy lifestyle promoted and encouraged to develop a rescue plan Rescue (action) plan reviewed and signs/symptoms of worsening disease assessed; encouraged to follow rescue plan if symptoms flare-up Discussed eliminating symptom triggers at home Encouraged to attend all scheduled provider  appointments   Instructed to call provider office for new concerns or questions Reviewed and assessed barriers and manage adherence, including inhaler technique and persistent trigger exposure; encourage adherence, even when symptoms are controlled or infrequent Encouraged to monitor for signs/symptoms of psychosocial concerns, such as shortness of breath-anxiety cycle or depression that may impact stability of symptoms Discussed allergic reaction to previous inhaler, Anora and subsequent diagnosis of dry mouth with provider recommended treatment of sipping water, lemon water and chewing gum; patient encouraged to contact provider for worsening symptoms Patient Goals/Self-Care Activities: : Identify and avoid  triggers Identify and remove indoor air pollutants Limit outdoor activity during cold weather  Rinse mouth after inhaler use Continue dry mouth treatment with sips of water, lemon water, chewing gum Contact provider if dry mouth or mouth rash gets worse Follow Up Plan: The care management team will reach out to the patient again over the next 45 business days.      Care Plan : Hypertension (Adult)  Updates made by Leona Singleton, RN since 03/16/2021 12:00 AM     Problem: Hypertension (Hypertension)   Priority: Medium     Long-Range Goal: Hypertension Monitored   Start Date: 03/15/2021  Expected End Date: 09/17/2021  Priority: Medium  Note:   Objective:  Last practice recorded BP readings:  BP Readings from Last 3 Encounters:  02/17/21 (!) 129/59  02/01/21 130/82  01/25/21 (!) 141/68  Current Barriers:  Knowledge Deficits related to basic understanding of hypertension pathophysiology and self care management reports monitoring home blood pressure about once a month.  States she has been told goal is SBP <160 and as she best remembers she has been below goal.  Discussed with patient monitoring blood pressures weekly and she agrees. Case Manager Clinical Goal(s):  patient  will verbalize understanding of plan for hypertension management patient will demonstrate improved adherence to prescribed treatment plan for hypertension as evidenced by taking all medications as prescribed, monitoring and recording blood pressure as directed, adhering to low sodium/DASH diet Interventions:  Collaboration with [name], MD regarding development and update of comprehensive plan of care as evidenced by provider attestation and co-signature Inter-disciplinary care team collaboration (see longitudinal plan of care) Evaluation of current treatment plan related to hypertension self management and patient's adherence to plan as established by provider. Provided education to patient re: stroke prevention, s/s of heart attack and stroke, DASH diet, complications of uncontrolled blood pressure Reviewed medications with patient and discussed importance of compliance Discussed plans with patient for ongoing care management follow up and provided patient with direct contact information for care management team Advised patient, providing education and rationale, to monitor blood pressure weekly and record, calling PCP for findings outside established parameters.  Blood pressure trends reviewed Depression screen reviewed Encouraged continued use of home blood pressure monitoring and recording in blood pressure log; include symptoms of hypotension or potential medication side effects in log  Encouraged to attend all scheduled provider appointments Instructed to call provider office for new concerns, questions, or BP outside discussed parameters  Reviewed and instructed to follow a low sodium diet/DASH diet Patient Goals/Self-Care Activities: Check blood pressure weekly Write blood pressure results in a log  Take log to medical appointments for provider review Low salt diet Follow Up Plan: The care management team will reach out to the patient again over the next 45 business days.        Plan:The care management team will reach out to the patient again over the next 45 business days.  Hubert Azure RN, MSN RN Care Management Coordinator [place] [phone] [email or web address]

## 2021-03-16 NOTE — Patient Instructions (Signed)
Visit Information   PATIENT GOALS:   Goals Addressed             This Visit's Progress    (RNCM) Track and Manage My Triggers-COPD       Timeframe:  Long-Range Goal Priority:  Medium Start Date:   03/15/21                          Expected End Date: 09/17/21                      Follow Up Date 04/16/21   Identify and avoid  triggers Identify and remove indoor air pollutants Limit outdoor activity during cold weather  Rinse mouth after inhaler use Continue dry mouth treatment with sips of water, lemon water, chewing gum Contact provider if dry mouth or mouth rash gets worse   Why is this important?   Triggers are activities or things, like tobacco smoke or cold weather, that make your COPD (chronic obstructive pulmonary disease) flare-up.  Knowing these triggers helps you plan how to stay away from them.  When you cannot remove them, you can learn how to manage them.     Notes:       (RNCM)Track and Manage My Blood Pressure-Hypertension       Timeframe:  Long-Range Goal Priority:  Medium Start Date:   03/15/21                          Expected End Date:  09/17/21                     Follow Up Date 04/16/21   Check blood pressure weekly Write blood pressure results in a log  Take log to medical appointments for provider review Low salt diet   Why is this important?   You won't feel high blood pressure, but it can still hurt your blood vessels.  High blood pressure can cause heart or kidney problems. It can also cause a stroke.  Making lifestyle changes like losing a little weight or eating less salt will help.  Checking your blood pressure at home and at different times of the day can help to control blood pressure.  If the doctor prescribes medicine remember to take it the way the doctor ordered.  Call the office if you cannot afford the medicine or if there are questions about it.     Notes:          Consent to CCM Services: Ms. Shilling was given  information about Chronic Care Management services today including:  CCM service includes personalized support from designated clinical staff supervised by her physician, including individualized plan of care and coordination with other care providers 24/7 contact phone numbers for assistance for urgent and routine care needs. Service will only be billed when office clinical staff spend 20 minutes or more in a month to coordinate care. Only one practitioner may furnish and bill the service in a calendar month. The patient may stop CCM services at any time (effective at the end of the month) by phone call to the office staff. The patient will be responsible for cost sharing (co-pay) of up to 20% of the service fee (after annual deductible is met).  Patient agreed to services and verbal consent obtained.   Patient verbalizes understanding of instructions provided today and agrees to view in [place].   The care  management team will reach out to the patient again over the next 45 business days.   Hubert Azure RN, MSN RN Care Management Coordinator [place] [phone] [email or web address]   CLINICAL CARE PLAN:   Problem Identified: Symptom Exacerbation (COPD)   Priority: Medium   Long-Range Goal: Symptom Exacerbation Prevented or Minimized   Start Date: 03/15/2021  Expected End Date: 09/17/2021  Priority: Medium  Note:   Current Barriers:  Knowledge deficits related to basic understanding of COPD disease process Knowledge deficits related to basic COPD self care/management history of COPD; Denies current shortness of breath.  Uses Combivent rescue inhaler occasionally, last use a few weeks ago.  Was on Anora, intolerable causing mouth rash/thrush that has turned into dry mouth per patient.  Does report continued occasional lip swelling Case Manager Clinical Goal(s): patient will report using inhalers as prescribed including rinsing mouth after  use patient will verbalize understanding of COPD action plan and when to seek appropriate levels of medical care patient will verbalize basic understanding of COPD disease process and self care activities  Interventions:  Collaboration with [name], MD regarding development and update of comprehensive plan of care as evidenced by provider attestation and co-signature Inter-disciplinary care team collaboration (see longitudinal plan of care) Provided patient with basic written and verbal COPD education on self care/management/and exacerbation prevention  Provided patient with COPD action plan and reinforced importance of daily self assessment Provided instruction about proper use of medications used for management of COPD including inhalers Advised patient to self assesses COPD action plan zone and make appointment with provider if in the yellow zone for 48 hours without improvement. Provided education about and advised patient to utilize infection prevention strategies to reduce risk of respiratory infection  Barriers to lifestyle changes reviewed and addressed Barriers to treatment reviewed and addressed Healthy lifestyle promoted and encouraged to develop a rescue plan Rescue (action) plan reviewed and signs/symptoms of worsening disease assessed; encouraged to follow rescue plan if symptoms flare-up Discussed eliminating symptom triggers at home Encouraged to attend all scheduled provider appointments   Instructed to call provider office for new concerns or questions Reviewed and assessed barriers and manage adherence, including inhaler technique and persistent trigger exposure; encourage adherence, even when symptoms are controlled or infrequent Encouraged to monitor for signs/symptoms of psychosocial concerns, such as shortness of breath-anxiety cycle or depression that may impact stability of symptoms Discussed allergic reaction to previous inhaler, Anora and subsequent diagnosis of  dry mouth with provider recommended treatment of sipping water, lemon water and chewing gum; patient encouraged to contact provider for worsening symptoms Patient Goals/Self-Care Activities: : Identify and avoid  triggers Identify and remove indoor air pollutants Limit outdoor activity during cold weather  Rinse mouth after inhaler use Continue dry mouth treatment with sips of water, lemon water, chewing gum Contact provider if dry mouth or mouth rash gets worse Follow Up Plan: The care management team will reach out to the patient again over the next 45 business days.     Patient Care Plan: Hypertension (Adult)   Problem Identified: Hypertension (Hypertension)   Priority: Medium   Long-Range Goal: Hypertension Monitored   Start Date: 03/15/2021  Expected End Date: 09/17/2021  Priority: Medium  Note:   Objective:  Last practice recorded BP readings:  BP Readings from Last 3 Encounters:  02/17/21 (!) 129/59  02/01/21 130/82  01/25/21 (!) 141/68  Current Barriers:  Knowledge Deficits related to basic understanding of hypertension pathophysiology and self care management reports  monitoring home blood pressure about once a month.  States she has been told goal is SBP <160 and as she best remembers she has been below goal.  Discussed with patient monitoring blood pressures weekly and she agrees. Case Manager Clinical Goal(s):  patient will verbalize understanding of plan for hypertension management patient will demonstrate improved adherence to prescribed treatment plan for hypertension as evidenced by taking all medications as prescribed, monitoring and recording blood pressure as directed, adhering to low sodium/DASH diet Interventions:  Collaboration with [name], MD regarding development and update of comprehensive plan of care as evidenced by provider attestation and co-signature Inter-disciplinary care team collaboration (see longitudinal plan of care) Evaluation of current  treatment plan related to hypertension self management and patient's adherence to plan as established by provider. Provided education to patient re: stroke prevention, s/s of heart attack and stroke, DASH diet, complications of uncontrolled blood pressure Reviewed medications with patient and discussed importance of compliance Discussed plans with patient for ongoing care management follow up and provided patient with direct contact information for care management team Advised patient, providing education and rationale, to monitor blood pressure weekly and record, calling PCP for findings outside established parameters.  Blood pressure trends reviewed Depression screen reviewed Encouraged continued use of home blood pressure monitoring and recording in blood pressure log; include symptoms of hypotension or potential medication side effects in log  Encouraged to attend all scheduled provider appointments Instructed to call provider office for new concerns, questions, or BP outside discussed parameters Reviewed and instructed to follow a low sodium diet/DASH diet Patient Goals/Self-Care Activities: Check blood pressure weekly Write blood pressure results in a log  Take log to medical appointments for provider review Low salt diet Follow Up Plan: The care management team will reach out to the patient again over the next 45 business days.

## 2021-03-17 ENCOUNTER — Other Ambulatory Visit: Payer: Self-pay

## 2021-03-17 ENCOUNTER — Ambulatory Visit: Payer: Medicare Other | Admitting: Physical Therapy

## 2021-03-17 DIAGNOSIS — M6281 Muscle weakness (generalized): Secondary | ICD-10-CM | POA: Diagnosis not present

## 2021-03-17 DIAGNOSIS — R531 Weakness: Secondary | ICD-10-CM

## 2021-03-17 DIAGNOSIS — R2689 Other abnormalities of gait and mobility: Secondary | ICD-10-CM | POA: Diagnosis not present

## 2021-03-17 DIAGNOSIS — R278 Other lack of coordination: Secondary | ICD-10-CM | POA: Diagnosis not present

## 2021-03-17 DIAGNOSIS — R269 Unspecified abnormalities of gait and mobility: Secondary | ICD-10-CM | POA: Diagnosis not present

## 2021-03-17 DIAGNOSIS — R262 Difficulty in walking, not elsewhere classified: Secondary | ICD-10-CM | POA: Diagnosis not present

## 2021-03-17 NOTE — Therapy (Signed)
[facility] [address], [place], [address] Phone: [phone]   Fax:  [phone]  Physical Therapy Treatment  Patient Details  Name: [name] MRN: [id number] Date of Birth: Feb 11, 1926 Referring Provider (PT): Derrel Nip MD   Encounter Date: 03/17/2021   PT End of Session - 03/17/21 1822     Visit Number 6    Number of Visits 25    Date for PT Re-Evaluation 05/12/21    Authorization Time Period INITIAL EVAL = 02/17/2021    Equipment Utilized During Treatment Gait belt    Activity Tolerance Patient tolerated treatment well    Behavior During Therapy [facility] for tasks assessed/performed             Past Medical History:  Diagnosis Date   C. difficile colitis    CAD (coronary artery disease)    a. s/p CABG 1999; b. [place] 10/11, no scar or ischemia, EF 68%; b. [place] 06/18/14: no evidence of infarction or ischemia, EF 77%, low risk study; c. 02/2019 MV: EF 56%, ? mild lat ischemia->low risk.   Carotid artery disease ([place])    a. Doppler January, 2012, 40-59% bilateral; b. 02/2017 U/S: 1-39% bilat ICA stenosis.   Closed fracture pubis ([place]) 07/04/2017   COPD (chronic obstructive pulmonary disease) (HCC)    Diffuse cystic mastopathy    Diverticulitis    last colonoscopy  incomplete Jan 2011   Dizziness    stable now (Oct 2011) patient tells me question of TIA, we will obtain records   Gait abnormality    Patient complains of "wobbly gait", December, 2013   GERD (gastroesophageal reflux disease)    History of migraines    Hx of CABG    a. 3 vessel in 1999   Hyperlipidemia    Hypertension    Indigestion    3 weeks, October 2011   Kidney stones    Rheumatic fever    Sinus bradycardia    Mild, December, 2013   SOB (shortness of breath)    Syncopal episodes    after 18 holes of golf and increase hydrochlorothiazide, resolved     Past Surgical History:  Procedure Laterality Date    [place]     multiple BIL   CATARACT EXTRACTION Right 2009   CATARACT EXTRACTION Left 2011   COLONOSCOPY  [id number]   Dr. Jamal Collin   CORONARY ARTERY BYPASS GRAFT  1999   esophagus stretched     [place]   due to metrorrhagia    There were no vitals filed for this visit.   Subjective Assessment - 03/17/21 1542     Subjective Pt reports not being able to sleep the past two nights, so she is not feeling very energetic. She reports being able to do her exercises. She describes feeling unsteady when walking and she would like to walk better.    Patient is accompained by: Family member   Daughter   Pertinent History Patient is a 87 year old female with referral from PCP for progressive weakness. Patient reports she is still mostly independent just moving around slower and not as active. She states she lives alone in a 2 story home without an AD for mobility. She reports her bedroom and bathroom are on 2nd level and that she has to negotiate steps with some difficulty. Past medical history  is significant for HTN, CAD, Hardening of Aorta, Sinus Bradycardia, Diastolic CHF, COPD, Hyperparathyroidism, Osteoporosis, CKD- Stage 3, Hyperlipidemia, LBP, COVID- Dec 2021.    Limitations Lifting;Standing;Walking;House hold activities    How long can you sit comfortably? NA    How long can you stand comfortably? maybe 5-10 min    How long can you walk comfortably? < 500 feet probably    Diagnostic tests NA    Patient Stated Goals I want to get my legs stronger    Currently in Pain? No/denies    Pain Score 0-No pain            EVALUATION:   STEADI 4-Stage Balance Test: (inability to maintain tandem stance for 10 sec = increased risk for falls) - feet together, eyes open, noncompliant surface: 10/10 sec - semi-tandem stance, eyes open, noncompliant surface: 10/10 sec - tandem stances, eyes  open, noncompliant surface: 10/10 sec - single leg stance:         - R LE: 10/10 sec         - L LE: 7/10 sec   NMR   Romberg with hor head turns 2 x 10   Romberg with vertical head turns 2 x 10  Romberg eyes closed 30 sec  Semi-tandem 30 sec  Semi-tandem hor head turns 2 x 10  Semi-tandem vert head turns 2 x 10  Semi-tandem eyes closed 30 sec   THEREX  Sit to stand 2 x 10  -Vcs to stand up fast and sit down slow   Standing Marches with 1 UE support 2 x 10    Nu-Step Level 2 Resistance and 5 min 3 sec                       PT Education - 03/17/21 1823     Education Details form/technique for correct exercise    Person(s) Educated Patient    Methods Explanation;Demonstration;Verbal cues;Handout    Comprehension Verbalized understanding;Returned demonstration;Verbal cues required              PT Short Term Goals - 03/17/21 1822       PT SHORT TERM GOAL #1   Title Pt will be independent with HEP in order to improve strength and balance in order to decrease fall risk and improve function at home and work.    Baseline 02/17/2021: Patient reports she has not been performing any exercises in the home.    Time 6    Period Weeks    Status New    Target Date 03/31/21               PT Long Term Goals - 03/17/21 1823       PT LONG TERM GOAL #1   Title Patient will increase 10 meter walk test to >1.19m/s as to improve gait speed for better community ambulation and to reduce fall risk.    Baseline 02/17/2021= 0.7 m/s without an AD with mild unsteadiness    Time 12    Period Weeks    Status New      PT LONG TERM GOAL #2   Title Pt will improve BERG by at least 3 points in order to demonstrate clinically significant improvement in balance.    Baseline 02/17/2021= 49/56    Time 12    Period Weeks    Status New      PT LONG TERM GOAL #3   Title Pt will improve FOTO to target score of  56/100 to display perceived improvements in ability to complete  ADL's.    Baseline 02/17/2021= 45/100    Time 12    Period Weeks    Status New      PT LONG TERM GOAL #4   Title Pt will decrease TUG to below 14 seconds/decrease in order to demonstrate decreased fall risk.    Baseline 02/17/2021= 17.35 sec without an AD    Time 12    Period Weeks    Status New      PT LONG TERM GOAL #5   Title Pt will decrease 5TSTS by at least 3 seconds in order to demonstrate clinically significant improvement in LE strength.    Baseline 02/17/2021= 20.03 sec without UE support    Time 12    Period Weeks    Status New                   Plan - 03/17/21 1819     Clinical Impression Statement Pt isa  87 yo presenting for follow-up for decreased balance and generalized weakness. She demonstrates improved and endurance with ability to perform standing strengthening exercises with decrease upper extremity support and completion of an increased amount of aerobic exercise. Pt does demonstrate at an increased risk of falling from 4 stage balance test. She will continue to benefit from skilled PT to progress her LE strength in order to decrease her risk of falling and to maintain her functional independence.    Personal Factors and Comorbidities Age;Comorbidity 3+    Comorbidities COPD/Diastolic CHF, HTN, high fall risk, former smoker,    Examination-Activity Limitations Carry;Lift;Locomotion Level;Squat;Stairs;Stand;Transfers    Examination-Participation Restrictions Church;Cleaning;Community Activity;Shop;Volunteer;Laundry;Yard Work    Stability/Clinical Decision Making Stable/Uncomplicated    Rehab Potential Good    PT Frequency 2x / week    PT Duration 12 weeks    PT Treatment/Interventions ADLs/Self Care Home Management;Cryotherapy;Moist Heat;Gait training;Stair training;Functional mobility training;Therapeutic activities;Therapeutic exercise;Balance training;Neuromuscular re-education;Patient/family education;Energy conservation;DME Instruction;Manual  techniques;Passive range of motion    PT Next Visit Plan Progress LE strengthening and balance activities as appropriate.    PT Home Exercise Plan Access Code: [email or web address]  URL: [email or web address]    Consulted and Agree with Plan of Care Patient             Patient will benefit from skilled therapeutic intervention in order to improve the following deficits and impairments:  Decreased balance, Decreased mobility, Difficulty walking, Cardiopulmonary status limiting activity, Decreased activity tolerance, Decreased strength, Decreased endurance, Decreased coordination, Hypomobility  Visit Diagnosis: Generalized weakness     Problem List Patient Active Problem List   Diagnosis Date Noted   Hyperparathyroidism due to renal insufficiency ([place]) 02/02/2021   CKD (chronic kidney disease) stage 4, GFR 15-29 ml/min ([place]) 01/15/2021   Grief 04/08/2020   History of esophageal stricture 04/08/2020   Leg swelling 03/09/2020   Left-sided chest wall pain 01/10/2020   Anorexia 01/10/2020   B12 deficiency 01/09/2020   Generalized weakness 07/14/2019   Mild malnutrition ([place]) 25/85/2778   Diastolic CHF, chronic ([place]) 03/25/2019   Vitamin D deficiency 03/25/2019   Melena 02/03/2018   S/P excision of skin lesion, follow-up exam 12/26/2017   Impingement syndrome of left shoulder region 07/04/2017   Neck pain 07/04/2017   Allergic rhinitis 12/31/2016   SCC (squamous cell carcinoma), face 08/01/2016   Osteoporosis of forearm 02/13/2016   Dizziness and giddiness 02/13/2016   Insomnia secondary to anxiety 07/12/2015   Anxiety disorder 07/12/2015   Essential (primary) hypertension 10/07/2014  Sinus bradycardia    Hardening of the aorta (main artery of the heart) (HCC) 06/26/2014   History of Clostridium difficile colitis 10/20/2013   Personal history of other diseases of the digestive system 10/20/2013   Low back pain 10/04/2013   Encounter for general adult medical examination  without abnormal findings 02/07/2013   Chest pain at rest 12/26/2011   COPD, moderate ([place]) 12/05/2011   Presence of aortocoronary bypass graft    Stage 3b chronic kidney disease ([place]) 09/25/2011   Hyperlipidemia    Hypertension    SOB (shortness of breath)    Coronary artery disease of native artery of native heart with stable angina pectoris ([place])    Carotid artery disease ([place])    Bradly Chris PT, DPT  03/17/2021, 6:26 PM  [facility] [address] [place], [address] Phone: [phone]   Fax:  [phone]  Name: [name] MRN: [id number] Date of Birth: March 01, 1926

## 2021-03-18 ENCOUNTER — Ambulatory Visit: Payer: Medicare Other

## 2021-03-23 ENCOUNTER — Ambulatory Visit: Payer: Medicare Other

## 2021-03-25 ENCOUNTER — Ambulatory Visit: Payer: Medicare Other

## 2021-03-25 DIAGNOSIS — H353221 Exudative age-related macular degeneration, left eye, with active choroidal neovascularization: Secondary | ICD-10-CM | POA: Diagnosis not present

## 2021-03-26 ENCOUNTER — Ambulatory Visit: Payer: Medicare Other

## 2021-03-30 ENCOUNTER — Ambulatory Visit: Payer: Medicare Other

## 2021-03-30 ENCOUNTER — Ambulatory Visit: Payer: Medicare Other | Admitting: Physical Therapy

## 2021-03-31 DIAGNOSIS — R202 Paresthesia of skin: Secondary | ICD-10-CM | POA: Diagnosis not present

## 2021-04-01 ENCOUNTER — Encounter: Payer: Self-pay | Admitting: Emergency Medicine

## 2021-04-01 ENCOUNTER — Other Ambulatory Visit: Payer: Self-pay

## 2021-04-01 ENCOUNTER — Emergency Department: Payer: Medicare Other

## 2021-04-01 ENCOUNTER — Ambulatory Visit: Payer: Medicare Other | Attending: Internal Medicine | Admitting: Physical Therapy

## 2021-04-01 ENCOUNTER — Observation Stay
Admission: EM | Admit: 2021-04-01 | Discharge: 2021-04-02 | Disposition: A | Discharge status: Home / Self Care | Payer: Medicare Other | Attending: Internal Medicine | Admitting: Internal Medicine

## 2021-04-01 ENCOUNTER — Ambulatory Visit: Payer: Medicare Other

## 2021-04-01 DIAGNOSIS — R269 Unspecified abnormalities of gait and mobility: Secondary | ICD-10-CM | POA: Insufficient documentation

## 2021-04-01 DIAGNOSIS — R0789 Other chest pain: Secondary | ICD-10-CM

## 2021-04-01 DIAGNOSIS — I25118 Atherosclerotic heart disease of native coronary artery with other forms of angina pectoris: Secondary | ICD-10-CM | POA: Diagnosis not present

## 2021-04-01 DIAGNOSIS — F411 Generalized anxiety disorder: Secondary | ICD-10-CM | POA: Diagnosis not present

## 2021-04-01 DIAGNOSIS — Z87891 Personal history of nicotine dependence: Secondary | ICD-10-CM | POA: Insufficient documentation

## 2021-04-01 DIAGNOSIS — R531 Weakness: Secondary | ICD-10-CM | POA: Diagnosis not present

## 2021-04-01 DIAGNOSIS — F419 Anxiety disorder, unspecified: Secondary | ICD-10-CM | POA: Diagnosis present

## 2021-04-01 DIAGNOSIS — N2581 Secondary hyperparathyroidism of renal origin: Secondary | ICD-10-CM | POA: Diagnosis present

## 2021-04-01 DIAGNOSIS — N1832 Chronic kidney disease, stage 3b: Secondary | ICD-10-CM | POA: Diagnosis present

## 2021-04-01 DIAGNOSIS — Z20822 Contact with and (suspected) exposure to covid-19: Secondary | ICD-10-CM | POA: Insufficient documentation

## 2021-04-01 DIAGNOSIS — E782 Mixed hyperlipidemia: Secondary | ICD-10-CM | POA: Diagnosis not present

## 2021-04-01 DIAGNOSIS — I2510 Atherosclerotic heart disease of native coronary artery without angina pectoris: Secondary | ICD-10-CM | POA: Diagnosis present

## 2021-04-01 DIAGNOSIS — I10 Essential (primary) hypertension: Secondary | ICD-10-CM | POA: Diagnosis present

## 2021-04-01 DIAGNOSIS — I5032 Chronic diastolic (congestive) heart failure: Secondary | ICD-10-CM | POA: Diagnosis not present

## 2021-04-01 DIAGNOSIS — E785 Hyperlipidemia, unspecified: Secondary | ICD-10-CM | POA: Diagnosis present

## 2021-04-01 DIAGNOSIS — I517 Cardiomegaly: Secondary | ICD-10-CM | POA: Diagnosis not present

## 2021-04-01 DIAGNOSIS — J449 Chronic obstructive pulmonary disease, unspecified: Secondary | ICD-10-CM | POA: Diagnosis present

## 2021-04-01 DIAGNOSIS — Z951 Presence of aortocoronary bypass graft: Secondary | ICD-10-CM | POA: Insufficient documentation

## 2021-04-01 DIAGNOSIS — I779 Disorder of arteries and arterioles, unspecified: Secondary | ICD-10-CM | POA: Diagnosis present

## 2021-04-01 DIAGNOSIS — K449 Diaphragmatic hernia without obstruction or gangrene: Secondary | ICD-10-CM | POA: Diagnosis not present

## 2021-04-01 DIAGNOSIS — Z79899 Other long term (current) drug therapy: Secondary | ICD-10-CM | POA: Diagnosis not present

## 2021-04-01 DIAGNOSIS — Z7902 Long term (current) use of antithrombotics/antiplatelets: Secondary | ICD-10-CM | POA: Insufficient documentation

## 2021-04-01 DIAGNOSIS — Z8719 Personal history of other diseases of the digestive system: Secondary | ICD-10-CM | POA: Diagnosis not present

## 2021-04-01 DIAGNOSIS — I959 Hypotension, unspecified: Principal | ICD-10-CM | POA: Diagnosis present

## 2021-04-01 DIAGNOSIS — N184 Chronic kidney disease, stage 4 (severe): Secondary | ICD-10-CM | POA: Insufficient documentation

## 2021-04-01 DIAGNOSIS — F5105 Insomnia due to other mental disorder: Secondary | ICD-10-CM | POA: Diagnosis present

## 2021-04-01 DIAGNOSIS — I130 Hypertensive heart and chronic kidney disease with heart failure and stage 1 through stage 4 chronic kidney disease, or unspecified chronic kidney disease: Secondary | ICD-10-CM | POA: Insufficient documentation

## 2021-04-01 DIAGNOSIS — R262 Difficulty in walking, not elsewhere classified: Secondary | ICD-10-CM | POA: Insufficient documentation

## 2021-04-01 DIAGNOSIS — I4891 Unspecified atrial fibrillation: Secondary | ICD-10-CM

## 2021-04-01 DIAGNOSIS — R0602 Shortness of breath: Secondary | ICD-10-CM | POA: Diagnosis not present

## 2021-04-01 LAB — URINALYSIS, COMPLETE (UACMP) WITH MICROSCOPIC
Bacteria, UA: NONE SEEN
Bilirubin Urine: NEGATIVE
Glucose, UA: NEGATIVE mg/dL
Hgb urine dipstick: NEGATIVE
Ketones, ur: NEGATIVE mg/dL
Nitrite: NEGATIVE
Protein, ur: 30 mg/dL — AB
Specific Gravity, Urine: 1.014 (ref 1.005–1.030)
pH: 5 (ref 5.0–8.0)

## 2021-04-01 LAB — CBC
HCT: 36.1 % (ref 36.0–46.0)
Hemoglobin: 12.2 g/dL (ref 12.0–15.0)
MCH: 34.8 pg — ABNORMAL HIGH (ref 26.0–34.0)
MCHC: 33.8 g/dL (ref 30.0–36.0)
MCV: 102.8 fL — ABNORMAL HIGH (ref 80.0–100.0)
Platelets: 196 10*3/uL (ref 150–400)
RBC: 3.51 MIL/uL — ABNORMAL LOW (ref 3.87–5.11)
RDW: 12 % (ref 11.5–15.5)
WBC: 7 10*3/uL (ref 4.0–10.5)
nRBC: 0 % (ref 0.0–0.2)

## 2021-04-01 LAB — BASIC METABOLIC PANEL
Anion gap: 9 (ref 5–15)
BUN: 23 mg/dL (ref 8–23)
CO2: 28 mmol/L (ref 22–32)
Calcium: 8.9 mg/dL (ref 8.9–10.3)
Chloride: 101 mmol/L (ref 98–111)
Creatinine, Ser: 1.45 mg/dL — ABNORMAL HIGH (ref 0.44–1.00)
GFR, Estimated: 34 mL/min — ABNORMAL LOW (ref >60)
Glucose, Bld: 122 mg/dL — ABNORMAL HIGH (ref 70–99)
Potassium: 4.7 mmol/L (ref 3.5–5.1)
Sodium: 138 mmol/L (ref 135–145)

## 2021-04-01 LAB — RESP PANEL BY RT-PCR (FLU A&B, COVID) ARPGX2
Influenza A by PCR: NEGATIVE
Influenza B by PCR: NEGATIVE
SARS Coronavirus 2 by RT PCR: NEGATIVE

## 2021-04-01 LAB — TROPONIN I (HIGH SENSITIVITY)
Troponin I (High Sensitivity): 10 ng/L (ref <18)
Troponin I (High Sensitivity): 10 ng/L (ref <18)

## 2021-04-01 LAB — TSH: TSH: 2.301 u[IU]/mL (ref 0.350–4.500)

## 2021-04-01 MED ORDER — CLOPIDOGREL BISULFATE 75 MG PO TABS
75.0000 mg | ORAL_TABLET | Freq: Every day | ORAL | Status: DC
  Administered 2021-04-02: 75 mg via ORAL
  Filled 2021-04-01: qty 1

## 2021-04-01 MED ORDER — CYANOCOBALAMIN 500 MCG PO TABS
500.0000 ug | ORAL_TABLET | Freq: Every day | ORAL | Status: DC
  Administered 2021-04-02: 500 ug via ORAL
  Filled 2021-04-01: qty 1

## 2021-04-01 MED ORDER — ENOXAPARIN SODIUM 30 MG/0.3ML IJ SOSY
30.0000 mg | PREFILLED_SYRINGE | INTRAMUSCULAR | Status: DC
  Administered 2021-04-02: 30 mg via SUBCUTANEOUS
  Filled 2021-04-01: qty 0.3

## 2021-04-01 MED ORDER — NITROGLYCERIN 2 % TD OINT
0.5000 [in_us] | TOPICAL_OINTMENT | Freq: Four times a day (QID) | TRANSDERMAL | Status: DC | PRN
  Filled 2021-04-01: qty 1

## 2021-04-01 MED ORDER — SODIUM CHLORIDE 0.9 % IV SOLN
1.0000 g | INTRAVENOUS | Status: DC
  Administered 2021-04-02: 1 g via INTRAVENOUS
  Filled 2021-04-01: qty 10

## 2021-04-01 MED ORDER — ISOSORBIDE MONONITRATE ER 60 MG PO TB24
90.0000 mg | ORAL_TABLET | Freq: Two times a day (BID) | ORAL | Status: DC
  Administered 2021-04-01 – 2021-04-02 (×2): 90 mg via ORAL
  Filled 2021-04-01 (×2): qty 2

## 2021-04-01 MED ORDER — ONDANSETRON HCL 4 MG/2ML IJ SOLN: 4.0000 mg | Freq: Four times a day (QID) | INTRAMUSCULAR | Status: DC | PRN

## 2021-04-01 MED ORDER — ACETAMINOPHEN 325 MG PO TABS
650.0000 mg | ORAL_TABLET | ORAL | Status: DC | PRN
  Administered 2021-04-02: 650 mg via ORAL
  Filled 2021-04-01: qty 2

## 2021-04-01 MED ORDER — SODIUM CHLORIDE 0.9 % IV SOLN: INTRAVENOUS | Status: DC

## 2021-04-01 MED ORDER — ATORVASTATIN CALCIUM 20 MG PO TABS
40.0000 mg | ORAL_TABLET | Freq: Every day | ORAL | Status: DC
  Administered 2021-04-02: 40 mg via ORAL
  Filled 2021-04-01: qty 2

## 2021-04-01 MED ORDER — RANOLAZINE ER 500 MG PO TB12
500.0000 mg | ORAL_TABLET | Freq: Two times a day (BID) | ORAL | Status: DC
  Administered 2021-04-01 – 2021-04-02 (×2): 500 mg via ORAL
  Filled 2021-04-01 (×3): qty 1

## 2021-04-01 MED ORDER — PANTOPRAZOLE SODIUM 40 MG PO TBEC
80.0000 mg | DELAYED_RELEASE_TABLET | Freq: Every day | ORAL | Status: DC
  Administered 2021-04-02: 80 mg via ORAL
  Filled 2021-04-01: qty 2

## 2021-04-01 NOTE — ED Triage Notes (Signed)
Pt sent over by physical therapy due to her blood pressure being low.  PT states her BP was 60'O systolic.  Pt  denies any CP, dizziness.  Pt states she has had some SHOB more than normal.  Pt also admits that she has been more tired lately than normal.  Pt in NAd at this time with even and unlabored respirations.

## 2021-04-01 NOTE — Therapy (Signed)
[facility] [address], [place], [address] Phone: [phone]   Fax:  [phone]  Physical Therapy Treatment  Patient Details  Name: [name] MRN: [id number] Date of Birth: [birth year] Referring Provider (PT): Derrel Nip MD   Encounter Date: 04/01/2021    Past Medical History:  Diagnosis Date   C. difficile colitis    CAD (coronary artery disease)    a. s/p CABG 1999; b. Nuclear 10/11, no scar or ischemia, EF 68%; b. [place] 06/18/14: no evidence of infarction or ischemia, EF 77%, low risk study; c. 02/2019 MV: EF 56%, ? mild lat ischemia->low risk.   Carotid artery disease ([place])    a. Doppler January, 2012, 40-59% bilateral; b. 02/2017 U/S: 1-39% bilat ICA stenosis.   Closed fracture pubis ([place]) 07/04/2017   COPD (chronic obstructive pulmonary disease) (HCC)    Diffuse cystic mastopathy    Diverticulitis    last colonoscopy  incomplete Jan 2011   Dizziness    stable now (Oct 2011) patient tells me question of TIA, we will obtain records   Gait abnormality    Patient complains of "wobbly gait", December, 2013   GERD (gastroesophageal reflux disease)    History of migraines    Hx of CABG    a. 3 vessel in 1999   Hyperlipidemia    Hypertension    Indigestion    3 weeks, October 2011   Kidney stones    Rheumatic fever    Sinus bradycardia    Mild, December, 2013   SOB (shortness of breath)    Syncopal episodes    after 18 holes of golf and increase hydrochlorothiazide, resolved     Past Surgical History:  Procedure Laterality Date   [place]     multiple BIL   CATARACT EXTRACTION Right 2009   CATARACT EXTRACTION Left 2011   COLONOSCOPY  [id number]   Dr. Jamal Collin   CORONARY ARTERY BYPASS GRAFT  1999   esophagus stretched     [place]   due to metrorrhagia    There  were no vitals filed for this visit.   Subjective Assessment - 04/01/21 1428     Subjective Pt reports that she has not been able to sleep well and that has been effecting her energy levels. She is going to see doctor and report this issue. She usually goes to bed around 10:30 and then wakes up at 2:00 am. She takes melatonin which helps her get to sleep but she has trouble staying asleep.    Patient is accompained by: Family member   Daughter   Pertinent History Patient is a 87 year old female with referral from PCP for progressive weakness. Patient reports she is still mostly independent just moving around slower and not as active. She states she lives alone in a 2 story home without an AD for mobility. She reports her bedroom and bathroom are on 2nd level and that she has to negotiate steps with some difficulty. Past medical history is significant for HTN, CAD, Hardening of Aorta, Sinus Bradycardia, Diastolic CHF, COPD, Hyperparathyroidism, Osteoporosis, CKD- Stage 3, Hyperlipidemia, LBP, COVID- Dec 2021.    Limitations Lifting;Standing;Walking;House hold activities    How long can you sit comfortably? NA    How long can you stand comfortably? maybe 5-10 min    How long can you  walk comfortably? < 500 feet probably    Diagnostic tests NA    Patient Stated Goals I want to get my legs stronger    Pain Score 0-No pain             VITALS  Sitting BP 90/55 HR 88 Standing BP 77/55 -No symptoms  Standing BP 102/50   PT recommended to pt that BP is low enough not to exercise and advised she go to ER or urgent care for further evaluation of BP to determine what is causing hypotension. Educated that BP lower than <02 systolic or <58 diastolic is grounds for medical emergency. She reported that she would go home and take BP and determine whether she needed to go to hospital after that.        PT Short Term Goals - 04/01/21 1457       PT SHORT TERM GOAL #1   Title Pt will be independent  with HEP in order to improve strength and balance in order to decrease fall risk and improve function at home and work.    Baseline 02/17/2021: Patient reports she has not been performing any exercises in the home.    Time 6    Period Weeks    Status New    Target Date 03/31/21               PT Long Term Goals - 04/01/21 1458       PT LONG TERM GOAL #1   Title Patient will increase 10 meter walk test to >1.42m/s as to improve gait speed for better community ambulation and to reduce fall risk.    Baseline 02/17/2021= 0.7 m/s without an AD with mild unsteadiness    Time 12    Period Weeks    Status New      PT LONG TERM GOAL #2   Title Pt will improve BERG by at least 3 points in order to demonstrate clinically significant improvement in balance.    Baseline 02/17/2021= 49/56    Time 12    Period Weeks    Status New      PT LONG TERM GOAL #3   Title Pt will improve FOTO to target score of 56/100 to display perceived improvements in ability to complete ADL's.    Baseline 02/17/2021= 45/100    Time 12    Period Weeks    Status New      PT LONG TERM GOAL #4   Title Pt will decrease TUG to below 14 seconds/decrease in order to demonstrate decreased fall risk.    Baseline 02/17/2021= 17.35 sec without an AD    Time 12    Period Weeks    Status New      PT LONG TERM GOAL #5   Title Pt will decrease 5TSTS by at least 3 seconds in order to demonstrate clinically significant improvement in LE strength.    Baseline 02/17/2021= 20.03 sec without UE support    Time 12    Period Weeks    Status New                   Plan - 04/01/21 1454     Clinical Impression Statement Pt presents for follow-up for generalized weakness and balance impairment. Session terminated, because of pt's hypotensive sitting and standing BP. Despite pt's BP rebounding to >52 systolic, she continued reporting feeling lethargic that seems like it could be related to both her lack of sleep and hypotensive BP.  Pt refused PT recommendation to call ambulance and go to ER. She instead drove home and stated she would monitor BP and head to ER or urgent care if it countinues to be low.    Personal Factors and Comorbidities Age;Comorbidity 3+    Comorbidities COPD/Diastolic CHF, HTN, high fall risk, former smoker,    Examination-Activity Limitations Carry;Lift;Locomotion Level;Squat;Stairs;Stand;Transfers    Examination-Participation Restrictions Church;Cleaning;Community Activity;Shop;Volunteer;Laundry;Yard Work    Stability/Clinical Decision Making Stable/Uncomplicated    Rehab Potential Good    PT Frequency 2x / week    PT Duration 12 weeks    PT Treatment/Interventions ADLs/Self Care Home Management;Cryotherapy;Moist Heat;Gait training;Stair training;Functional mobility training;Therapeutic activities;Therapeutic exercise;Balance training;Neuromuscular re-education;Patient/family education;Energy conservation;DME Instruction;Manual techniques;Passive range of motion    PT Next Visit Plan Progress LE strengthening and balance activities as appropriate.    PT Home Exercise Plan Access Code: [email or web address]  URL: [email or web address]    Consulted and Agree with Plan of Care Patient             Patient will benefit from skilled therapeutic intervention in order to improve the following deficits and impairments:  Decreased balance, Decreased mobility, Difficulty walking, Cardiopulmonary status limiting activity, Decreased activity tolerance, Decreased strength, Decreased endurance, Decreased coordination, Hypomobility  Visit Diagnosis: Generalized weakness  Abnormality of gait and mobility  Difficulty in walking, not elsewhere classified     Problem List Patient Active Problem List   Diagnosis Date Noted   Hyperparathyroidism due to renal insufficiency ([place]) 02/02/2021   CKD (chronic kidney disease) stage 4, GFR 15-29 ml/min ([place]) 01/15/2021   Grief 04/08/2020   History of esophageal  stricture 04/08/2020   Leg swelling 03/09/2020   Left-sided chest wall pain 01/10/2020   Anorexia 01/10/2020   B12 deficiency 01/09/2020   Generalized weakness 07/14/2019   Mild malnutrition ([place]) 54/00/8676   Diastolic CHF, chronic ([place]) 03/25/2019   Vitamin D deficiency 03/25/2019   Melena 02/03/2018   S/P excision of skin lesion, follow-up exam 12/26/2017   Impingement syndrome of left shoulder region 07/04/2017   Neck pain 07/04/2017   Allergic rhinitis 12/31/2016   SCC (squamous cell carcinoma), face 08/01/2016   Osteoporosis of forearm 02/13/2016   Dizziness and giddiness 02/13/2016   Insomnia secondary to anxiety 07/12/2015   Anxiety disorder 07/12/2015   Essential (primary) hypertension 10/07/2014   Sinus bradycardia    Hardening of the aorta (main artery of the heart) ([place]) 06/26/2014   History of Clostridium difficile colitis 10/20/2013   Personal history of other diseases of the digestive system 10/20/2013   Low back pain 10/04/2013   Encounter for general adult medical examination without abnormal findings 02/07/2013   Chest pain at rest 12/26/2011   COPD, moderate ([place]) 12/05/2011   Presence of aortocoronary bypass graft    Stage 3b chronic kidney disease ([place]) 09/25/2011   Hyperlipidemia    Hypertension    SOB (shortness of breath)    Coronary artery disease of native artery of native heart with stable angina pectoris (HCC)    Carotid artery disease ([place])    Bradly Chris PT, DPT  04/01/2021, 2:59 PM  [facility] [address] [place], [address] Phone: [phone]   Fax:  [phone]  Name: [name] MRN: [id number] Date of Birth: 10-06-1925

## 2021-04-01 NOTE — Discharge Instructions (Addendum)
Please follow-up with your cardiologist Dr. Rockey Situ.  I have texted him.  He should call you and set up a follow-up appointment in the morning.  Please make sure you return here if you get worse.  This includes any chest pain or shortness of breath or fever  or other problems.I really wish you would stay in the hospital. It would be much safer.

## 2021-04-01 NOTE — ED Notes (Signed)
Pt aware of need for urine specimen, unable to provide one at this time.

## 2021-04-01 NOTE — ED Provider Notes (Signed)
[facility], [name]. Emergency Department Provider Note   ____________________________________________   Event Date/Time   First MD Initiated Contact with Patient 04/01/21 1700     (approximate)  I have reviewed the triage vital signs and the nursing notes.   HISTORY  Chief Complaint Hypotension   HPI [name] is a 87 y.o. female who was going down to physical therapy today but her blood pressure there was in the 70Y systolic and then went down to 77 systolic and she was sent up to the emergency room.  She reports she has been a little bit more short of breath than usual and more tired than normal lately.  She is not having any pleuritic pain or coughing or sneezing or other problems.         Past Medical History:  Diagnosis Date   C. difficile colitis    CAD (coronary artery disease)    a. s/p CABG 1999; b. Nuclear 10/11, no scar or ischemia, EF 68%; b. [place] 06/18/14: no evidence of infarction or ischemia, EF 77%, low risk study; c. 02/2019 MV: EF 56%, ? mild lat ischemia->low risk.   Carotid artery disease ([place])    a. Doppler January, 2012, 40-59% bilateral; b. 02/2017 U/S: 1-39% bilat ICA stenosis.   Closed fracture pubis ([place]) 07/04/2017   COPD (chronic obstructive pulmonary disease) (HCC)    Diffuse cystic mastopathy    Diverticulitis    last colonoscopy  incomplete Jan 2011   Dizziness    stable now (Oct 2011) patient tells me question of TIA, we will obtain records   Gait abnormality    Patient complains of "wobbly gait", December, 2013   GERD (gastroesophageal reflux disease)    History of migraines    Hx of CABG    a. 3 vessel in 1999   Hyperlipidemia    Hypertension    Indigestion    3 weeks, October 2011   Kidney stones    Rheumatic fever    Sinus bradycardia    Mild, December, 2013   SOB (shortness of breath)    Syncopal episodes    after 18 holes of golf and increase hydrochlorothiazide, resolved     Patient  Active Problem List   Diagnosis Date Noted   Hypotension 04/01/2021   Hyperparathyroidism due to renal insufficiency ([place]) 02/02/2021   CKD (chronic kidney disease) stage 4, GFR 15-29 ml/min ([place]) 01/15/2021   Grief 04/08/2020   History of esophageal stricture 04/08/2020   Leg swelling 03/09/2020   Atypical chest pain 01/10/2020   Anorexia 01/10/2020   B12 deficiency 01/09/2020   Generalized weakness 07/14/2019   Mild malnutrition ([place]) 63/78/5885   Diastolic CHF, chronic ([place]) 03/25/2019   Vitamin D deficiency 03/25/2019   Melena 02/03/2018   S/P excision of skin lesion, follow-up exam 12/26/2017   Impingement syndrome of left shoulder region 07/04/2017   Neck pain 07/04/2017   Allergic rhinitis 12/31/2016   SCC (squamous cell carcinoma), face 08/01/2016   Osteoporosis of forearm 02/13/2016   Dizziness and giddiness 02/13/2016   Insomnia secondary to anxiety 07/12/2015   Anxiety disorder 07/12/2015   Essential (primary) hypertension 10/07/2014   Sinus bradycardia    Hardening of the aorta (main artery of the heart) ([place]) 06/26/2014   History of Clostridium difficile colitis 10/20/2013   Personal history of other diseases of the digestive system 10/20/2013   Low back pain 10/04/2013   Encounter for general adult medical examination without abnormal findings 02/07/2013   Chest  pain at rest 12/26/2011   COPD, moderate ([place]) 12/05/2011   Presence of aortocoronary bypass graft    Stage 3b chronic kidney disease ([place]) 09/25/2011   Hyperlipidemia    Hypertension    SOB (shortness of breath)    Coronary artery disease of native artery of native heart with stable angina pectoris ([place])    Carotid artery disease ([place])     Past Surgical History:  Procedure Laterality Date   APPENDECTOMY  1950   BREAST BIOPSY Right 1994   BREAST CYST ASPIRATION     multiple BIL   CATARACT EXTRACTION Right 2009   CATARACT EXTRACTION Left 2011   COLONOSCOPY  [id number]   Dr. Jamal Collin   CORONARY  ARTERY BYPASS GRAFT  1999   esophagus stretched     [place]   due to metrorrhagia    Prior to Admission medications   Medication Sig Start Date End Date Taking? Authorizing Provider  atorvastatin (LIPITOR) 40 MG tablet TAKE ONE TABLET BY MOUTH EVERY DAY 11/09/20  Yes [name], MD  clopidogrel (PLAVIX) 75 MG tablet TAKE 1 TABLET BY MOUTH DAILY 03/12/21  Yes Gollan, Kathlene November, MD  Cranberry 125 MG TABS Take 1 tablet by mouth daily.   Yes [provider]  esomeprazole (NEXIUM) 40 MG capsule Take 40 mg by mouth daily at 12 noon.   Yes [provider]  Ipratropium-Albuterol (COMBIVENT) 20-100 MCG/ACT AERS respimat Inhale 1 puff into the lungs every 6 (six) hours as needed for wheezing. 02/05/20  Yes Minna Merritts, MD  isosorbide mononitrate (IMDUR) 60 MG 24 hr tablet Take 1.5 tablets (90 mg total) by mouth 2 (two) times daily. 04/07/20  Yes Minna Merritts, MD  losartan (COZAAR) 100 MG tablet TAKE 1 TABLET BY MOUTH DAILY 03/12/21  Yes Gollan, Kathlene November, MD  Probiotic Product (PROBIOTIC PO) Take 1 capsule by mouth daily.   Yes [provider]  ranolazine (RANEXA) 500 MG 12 hr tablet TAKE 1 TABLET BY MOUTH TWICE DAILY 03/12/21  Yes Gollan, Kathlene November, MD  vitamin B-12 (CYANOCOBALAMIN) 500 MCG tablet Take 500 mcg by mouth daily.   Yes [provider]  nitroGLYCERIN (NITROSTAT) 0.4 MG SL tablet PLACE 1 TABLET UNDER TONGUE EVERY 5 MIN AS NEEDED FOR CHEST PAIN IF NO RELIEF IN15 MIN CALL 911 (MAX 3 TABS) 04/07/20   Gollan, Kathlene November, MD  propranolol (INDERAL) 20 MG tablet TAKE ONE TABLET 3 TIMES DAILY AS NEEDED FOR TACHYCARDIA Patient not taking: No sig reported 04/07/20   Minna Merritts, MD    Allergies Ciprofloxacin  Family History  Problem Relation Age of Onset   Heart disease Mother    Heart disease Father    Cancer Neg Hx    Drug abuse Neg Hx    Breast cancer Neg Hx     Social History Social  History   Tobacco Use   Smoking status: Former    Packs/day: 0.50    Years: 40.00    Pack years: 20.00    Types: Cigarettes    Quit date: 09/20/1983    Years since quitting: 37.5   Smokeless tobacco: Never  Vaping Use   Vaping Use: Never used  Substance Use Topics   Alcohol use: Not Currently    Comment: yes, social wine   Drug use: No    Review of Systems  Constitutional: No fever/chills Eyes: No visual changes. ENT: No sore throat. Cardiovascular: Denies chest pain.  Respiratory: Currently denies shortness of breath. Gastrointestinal: No abdominal pain.  No nausea, no vomiting.  No diarrhea.  No constipation. Genitourinary: Negative for dysuria. Musculoskeletal: Negative for back pain. Skin: Negative for rash. Neurological: Negative for headaches, focal weakness   ____________________________________________   PHYSICAL EXAM:  VITAL SIGNS: ED Triage Vitals [04/01/21 1556]  Enc Vitals Group     BP (!) 174/69     Pulse Rate 74     Resp 17     Temp 98.7 F (37.1 C)     Temp Source Oral     SpO2 99 %     Weight 128 lb 1.4 oz (58.1 kg)     Height 5\' 3"  (1.6 m)     Head Circumference      Peak Flow      Pain Score 0     Pain Loc      Pain Edu?      Excl. in [place]?    Constitutional: Alert and oriented. Well appearing and in no acute distress. Eyes: Conjunctivae are normal. PER. EOMI. Head: Atraumatic. Nose: No congestion/rhinnorhea. Mouth/Throat: Mucous membranes are moist.  Oropharynx non-erythematous. Neck: No stridor. Cardiovascular: Normal rate, regular rhythm. Grossly normal heart sounds.  Good peripheral circulation. Respiratory: Normal respiratory effort.  No retractions. Lungs CTAB. Gastrointestinal: Soft and nontender. No distention. No abdominal bruits.  Musculoskeletal: No lower extremity tenderness nor edema.  Neurologic:  Normal speech and language. No gross focal neurologic deficits are appreciated. . Skin:  Skin is warm, dry and intact. No rash  noted. Psychiatric: Mood and affect are normal. Speech and behavior are normal.  ____________________________________________   LABS (all labs ordered are listed, but only abnormal results are displayed)  Labs Reviewed  BASIC METABOLIC PANEL - Abnormal; Notable for the following components:      Result Value   Glucose, Bld 122 (*)    Creatinine, Ser 1.45 (*)    GFR, Estimated 34 (*)    All other components within normal limits  CBC - Abnormal; Notable for the following components:   RBC 3.51 (*)    MCV 102.8 (*)    MCH 34.8 (*)    All other components within normal limits  URINALYSIS, COMPLETE (UACMP) WITH MICROSCOPIC - Abnormal; Notable for the following components:   Color, Urine YELLOW (*)    APPearance CLEAR (*)    Protein, ur 30 (*)    Leukocytes,Ua MODERATE (*)    All other components within normal limits  RESP PANEL BY RT-PCR (FLU A&B, COVID) ARPGX2  TSH  LIPID PANEL  MAGNESIUM  TROPONIN I (HIGH SENSITIVITY)  TROPONIN I (HIGH SENSITIVITY)   ____________________________________________  EKG  EKG read interpreted by me shows normal sinus rhythm rate of 74 normal axis nonspecific ST-T wave changes similar to prior EKGs.  Monitor strip was obtained which showed what appeared to be an episode of A. fib.  Heart rate was read on the monitor is 142 this looks real there look like there were some PVCs in it as well.  Patient had no symptoms or hypotension with this however.  Patient was maintained on the monitor during her stay here and did have 1 brief episode of hypotension with blood pressure down to 98 and 104 again she did not have any symptoms. ____________________________________________  RADIOLOGY Gertha Calkin, personally viewed and evaluated these images (plain radiographs) as part of my medical decision making, as well as reviewing the written report by the radiologist.  ED MD interpretation:  Chest x-ray read by radiology reviewed by me did not show any  acute process.  Official radiology report(s): No results found.  ____________________________________________   PROCEDURES  Procedure(s) performed (including Critical Care):  Procedures   ____________________________________________   INITIAL IMPRESSION / ASSESSMENT AND PLAN / ED COURSE Old records were reviewed old EKGs were reviewed additionally I spoke with Dr. Algernon Huxley on-call for cardiology.  Patient refuses adamantly to stay.  Her daughter is here with her.  She is gone to go home.  She will follow-up with Dr. Rockey Situ who is her regular cardiologist.  I will text him at Dr. Aundra Dubin suggestion and we will see if hopefully they can see her tomorrow.  She will return if she is worse.  Later patient changes her mind and will stay in the hospital.  We will get her admitted.          ____________________________________________   FINAL CLINICAL IMPRESSION(S) / ED DIAGNOSES  Final diagnoses:  Hypotension, unspecified hypotension type  Atrial fibrillation, unspecified type [facility])     ED Discharge Orders          Ordered    Increase activity slowly        04/02/21 1410    Diet - low sodium heart healthy        04/02/21 1410             Note:  This document was prepared using Dragon voice recognition software and may include unintentional dictation errors.    Nena Polio, MD 04/04/21 1255

## 2021-04-01 NOTE — H&P (Signed)
History and Physical   [name]:[id number] DOB: 1926/02/10 DOA: 04/01/2021  PCP: [name], MD  Outpatient Specialists: Dr. Rockey Situ, cardiology Patient coming from:   I have personally briefly reviewed patient's old medical records in [place].  Chief Concern: Hypertension  HPI: [name] is a 87 y.o. female with medical history significant for history of CAD status post bypass surgery 1999, remote history of tobacco use, quit in 1985, COPD, hypertension, hyperlipidemia, bilateral carotid arterial disease, anxiety, insomnia, presents to the emergency department for chief concerns of weakness and hypotension.  Patient was at physical therapy and prior to initiating physical therapy patient had low blood pressure with a systolic pressure in the 19J.  Patient states in the last week she has had a couple of episodes of feeling chest pain on her lateral left side that improved with nitroglycerin at home.  However at bedside she feels like she is not sure if the nitroglycerin improved the chest pain and she feels it may have been musculoskeletal and that she pulled something.  She denies trauma to the area.  At bedside patient is able to tell me her name, age, location of hospital.  She denies that she is in chest pain and/or shortness of breath, abdominal pain, dysuria, hematuria.  She endorses that she has been urinating a lot lately however denies urgency.  She states that she has been drinking a lot of water in an effort to prevent dehydration as she has been hospitalized for dehydration multiple times in the past.  Social history: Patient had remote tobacco use, quit in 1985.  Patient denies EtOH and recreational drug use  ROS: Constitutional: no weight change, no fever ENT/Mouth: no sore throat, no rhinorrhea Eyes: no eye pain, no vision changes Cardiovascular: no chest pain, no dyspnea,  no edema, no palpitations Respiratory: no cough, no sputum, no  wheezing Gastrointestinal: no nausea, no vomiting, no diarrhea, no constipation Genitourinary: no urinary incontinence, no dysuria, no hematuria Musculoskeletal: no arthralgias, no myalgias Skin: no skin lesions, no pruritus, Neuro: + weakness, no loss of consciousness, no syncope Psych: no anxiety, no depression, + decrease appetite Heme/Lymph: no bruising, no bleeding  ED Course: Discussed with emergency medicine provider, patient requiring hospitalization for chief concerns of hypotension and shortness of breath.  Vitals in the emergency department was remarkable for temperature of 98.7, respiration rate of 17, heart rate of 74, blood pressure 174/69, SPO2 of 99% on room air.  Labs in the emergency department was remarkable for normal saline 138, potassium 4.7, chloride 101, BUN 23, serum creatinine of 1.45, nonfasting blood glucose 122, EGFR 34, WBC 7, hemoglobin of 12.2, platelets 196.  UA in the emergency department was remarkable for moderate leukocytes.  Troponin was negative x2.  Assessment/Plan  Principal Problem:   Hypotension Active Problems:   Hyperlipidemia   Hypertension   Coronary artery disease of native artery of native heart with stable angina pectoris (HCC)   Carotid artery disease (HCC)   Stage 3b chronic kidney disease (HCC)   COPD, moderate ([place])   Essential (primary) hypertension   Insomnia secondary to anxiety   Anxiety disorder   Generalized weakness   History of esophageal stricture   Hyperparathyroidism due to renal insufficiency ([place])   # Weakness and hypotension - TSH was within normal limits, B12 levels was 1084 on 12/11/2020, vitamin D was 32.35 in March 2020 - Complete echo ordered - Normal saline 125 mL/h, 1 day ordered  - Of note,  patient had a outpatient nuclear medicine stress test ordered for July 2021 however this has not been done - A.m. team to consider consultation to cardiology for further recommendations  # Pyuria in setting of  polyuria and weakness and hypotension -UA was positive for moderate leukocytes - Ceftriaxone 1 g IV ordered, 3 doses  # Coronary artery disease status post CABG in 1999-isosorbide mononitrate 90 mg p.o. twice daily, Ranexa 500 mg twice daily, atorvastatin 40 mg daily resumed  # Hyperlipidemia-atorvastatin # History of hypertension-holding home losartan at this time due to hypotension # CKD 3B-at baseline # Moderate COPD-not in exacerbation # History of COVID-19 infection in December 2021  # CODE STATUS-patient endorses DNR CODE STATUS with daughter [name] at bedside who states that she respects patient's decision  Chart reviewed.   Echo on 01/15/2020: Ejection fraction was read as 50 to 16%, grade 1 diastolic dysfunction Prior stress test in 2019 and 2020, last stress test in September 2020 showed low risk.  DVT prophylaxis: Enoxaparin every 24 hours, weight-based Code Status: DNR Diet: Heart healthy Family Communication: Updated daughter, [name] at bedside Disposition Plan: Pending clinical course Consults called: None at this time Admission status: MedSurg, observation, telemetry  Past Medical History:  Diagnosis Date   C. difficile colitis    CAD (coronary artery disease)    a. s/p CABG 1999; b. Nuclear 10/11, no scar or ischemia, EF 68%; b. [place] 06/18/14: no evidence of infarction or ischemia, EF 77%, low risk study; c. 02/2019 MV: EF 56%, ? mild lat ischemia->low risk.   Carotid artery disease ([place])    a. Doppler January, 2012, 40-59% bilateral; b. 02/2017 U/S: 1-39% bilat ICA stenosis.   Closed fracture pubis ([place]) 07/04/2017   COPD (chronic obstructive pulmonary disease) (HCC)    Diffuse cystic mastopathy    Diverticulitis    last colonoscopy  incomplete Jan 2011   Dizziness    stable now (Oct 2011) patient tells me question of TIA, we will obtain records   Gait abnormality    Patient complains of "wobbly gait", December, 2013   GERD (gastroesophageal reflux  disease)    History of migraines    Hx of CABG    a. 3 vessel in 1999   Hyperlipidemia    Hypertension    Indigestion    3 weeks, October 2011   Kidney stones    Rheumatic fever    Sinus bradycardia    Mild, December, 2013   SOB (shortness of breath)    Syncopal episodes    after 18 holes of golf and increase hydrochlorothiazide, resolved    Past Surgical History:  Procedure Laterality Date   [place]     multiple BIL   CATARACT EXTRACTION Right 2009   CATARACT EXTRACTION Left 2011   COLONOSCOPY  [id number]   Dr. Jamal Collin   CORONARY ARTERY BYPASS GRAFT  1999   esophagus stretched     [place]   due to metrorrhagia   Social History:  reports that she quit smoking about 37 years ago. Her smoking use included cigarettes. She has a 20.00 pack-year smoking history. She has never used smokeless tobacco. She reports previous alcohol use. She reports that she does not use drugs.  Allergies  Allergen Reactions   Ciprofloxacin     ? Caused c diff    Family History  Problem Relation Age of Onset  Heart disease Mother    Heart disease Father    Cancer Neg Hx    Drug abuse Neg Hx    Breast cancer Neg Hx    Family history: Family history reviewed and not pertinent  Prior to Admission medications   Medication Sig Start Date End Date Taking? Authorizing Provider  atorvastatin (LIPITOR) 40 MG tablet TAKE ONE TABLET BY MOUTH EVERY DAY 11/09/20   [name], MD  clopidogrel (PLAVIX) 75 MG tablet TAKE 1 TABLET BY MOUTH DAILY 03/12/21   Minna Merritts, MD  Cranberry 125 MG TABS Take 1 tablet by mouth daily.    [provider]  esomeprazole (NEXIUM) 40 MG capsule Take 40 mg by mouth daily at 12 noon.    [provider]  Ipratropium-Albuterol (COMBIVENT) 20-100 MCG/ACT AERS respimat Inhale 1 puff into the lungs every 6 (six) hours as needed for wheezing.  02/05/20   Minna Merritts, MD  isosorbide mononitrate (IMDUR) 60 MG 24 hr tablet Take 1.5 tablets (90 mg total) by mouth 2 (two) times daily. 04/07/20   Minna Merritts, MD  losartan (COZAAR) 100 MG tablet TAKE 1 TABLET BY MOUTH DAILY 03/12/21   Minna Merritts, MD  magic mouthwash (nystatin, lidocaine, diphenhydrAMINE, alum & mag hydroxide) suspension Mix ingredients 1:1:1:1 Swish 56m twice daily for th next 14 days Patient not taking: Reported on 03/15/2021 01/25/21   PHarvest Dark MD  nitroGLYCERIN (NITROSTAT) 0.4 MG SL tablet PLACE 1 TABLET UNDER TONGUE EVERY 5 MIN AS NEEDED FOR CHEST PAIN IF NO RELIEF IN15 MIN CALL 911 (MAX 3 TABS) 04/07/20   Gollan, TKathlene November MD  Probiotic Product (PROBIOTIC PO) Take 1 capsule by mouth daily.    [provider]  propranolol (INDERAL) 20 MG tablet TAKE ONE TABLET 3 TIMES DAILY AS NEEDED FOR TACHYCARDIA Patient not taking: Reported on 02/09/2021 04/07/20   GMinna Merritts MD  ranolazine (RANEXA) 500 MG 12 hr tablet TAKE 1 TABLET BY MOUTH TWICE DAILY 03/12/21   GMinna Merritts MD  vitamin B-12 (CYANOCOBALAMIN) 500 MCG tablet Take 500 mcg by mouth daily.    [provider]   Physical Exam: Vitals:   04/01/21 1715 04/01/21 1830 04/01/21 1900 04/01/21 2008  BP: (!) 179/68 (!) 148/81 98/80 (!) 187/72  Pulse: 80 63 71 71  Resp: '16 15 15 18  ' Temp:      TempSrc:      SpO2: 97% 98% 99% 100%  Weight:      Height:       Constitutional: appears age-appropriate, frail, NAD, calm, comfortable Eyes: PERRL, lids and conjunctivae normal ENMT: Mucous membranes are moist. Posterior pharynx clear of any exudate or lesions. Age-appropriate dentition. Hearing appropriate Neck: normal, supple, no masses, no thyromegaly Respiratory: clear to auscultation bilaterally, no wheezing, no crackles. Normal respiratory effort. No accessory muscle use.  Cardiovascular: Regular rate and rhythm, no murmurs / rubs / gallops. No extremity edema. 2+ pedal  pulses. No carotid bruits.  Abdomen: no tenderness, no masses palpated, no hepatosplenomegaly. Bowel sounds positive.  Musculoskeletal: no clubbing / cyanosis. No joint deformity upper and lower extremities. Good ROM, no contractures, no atrophy. Normal muscle tone.  Skin: no rashes, lesions, ulcers. No induration Neurologic: Sensation intact. Strength 5/5 in all 4.  Psychiatric: Normal judgment and insight. Alert and oriented x 3. Normal mood.   EKG: independently reviewed, showing sinus rhythm with rate of 74, QTc 421  Chest x-ray on Admission: I personally reviewed and I agree with  radiologist reading as below.  DG Chest 2 View  Result Date: 04/01/2021 CLINICAL DATA:  Shortness of breath and low blood pressure. EXAM: CHEST - 2 VIEW COMPARISON:  01/20/2021 FINDINGS: Hyperinflation. Prior median sternotomy. Patient rotated right. Mild cardiomegaly. Atherosclerosis in the transverse aorta. Moderate to large hiatal hernia. Left apical pleuroparenchymal scarring. Diffuse peribronchial thickening. No lobar consolidation. IMPRESSION: Hyperinflation and interstitial thickening, likely related to COPD/chronic bronchitis. No acute superimposed process. Hiatal hernia Aortic Atherosclerosis (ICD10-I70.0). Electronically Signed   By: Abigail Miyamoto M.D.   On: 04/01/2021 16:44    Labs on Admission: I have personally reviewed following labs  CBC: Recent Labs  Lab 04/01/21 1558  WBC 7.0  HGB 12.2  HCT 36.1  MCV 102.8*  PLT 720   Basic Metabolic Panel: Recent Labs  Lab 04/01/21 1558  NA 138  K 4.7  CL 101  CO2 28  GLUCOSE 122*  BUN 23  CREATININE 1.45*  CALCIUM 8.9   GFR: Estimated Creatinine Clearance: 20.5 mL/min (A) (by C-G formula based on SCr of 1.45 mg/dL (H)).  Thyroid Function Tests: Recent Labs    04/01/21 1558  TSH 2.301   Urine analysis:    Component Value Date/Time   COLORURINE YELLOW (A) 04/01/2021 1828   APPEARANCEUR CLEAR (A) 04/01/2021 1828   APPEARANCEUR Clear  01/15/2021 1134   LABSPEC 1.014 04/01/2021 1828   LABSPEC 1.025 10/13/2013 1038   PHURINE 5.0 04/01/2021 1828   GLUCOSEU NEGATIVE 04/01/2021 1828   GLUCOSEU NEGATIVE 11/22/2017 1537   HGBUR NEGATIVE 04/01/2021 1828   BILIRUBINUR NEGATIVE 04/01/2021 1828   BILIRUBINUR Negative 01/15/2021 1134   BILIRUBINUR Negative 10/13/2013 1038   [place] 04/01/2021 1828   PROTEINUR 30 (A) 04/01/2021 1828   UROBILINOGEN 0.2 11/22/2017 1537   NITRITE NEGATIVE 04/01/2021 1828   LEUKOCYTESUR MODERATE (A) 04/01/2021 1828   LEUKOCYTESUR Negative 10/13/2013 1038   Dr. Tobie Poet [facility]  If 7PM-7AM, please contact overnight-coverage provider If 7AM-7PM, please contact day coverage provider [email or web address]  04/01/2021, 10:59 PM

## 2021-04-02 ENCOUNTER — Other Ambulatory Visit: Payer: Self-pay

## 2021-04-02 ENCOUNTER — Observation Stay (HOSPITAL_BASED_OUTPATIENT_CLINIC_OR_DEPARTMENT_OTHER)
Admit: 2021-04-02 | Discharge: 2021-04-02 | Disposition: A | Discharge status: Home / Self Care | Payer: Medicare Other | Attending: Internal Medicine | Admitting: Internal Medicine

## 2021-04-02 DIAGNOSIS — R531 Weakness: Secondary | ICD-10-CM | POA: Diagnosis not present

## 2021-04-02 DIAGNOSIS — R0789 Other chest pain: Secondary | ICD-10-CM | POA: Diagnosis not present

## 2021-04-02 DIAGNOSIS — F411 Generalized anxiety disorder: Secondary | ICD-10-CM

## 2021-04-02 DIAGNOSIS — N1832 Chronic kidney disease, stage 3b: Secondary | ICD-10-CM | POA: Diagnosis not present

## 2021-04-02 DIAGNOSIS — I10 Essential (primary) hypertension: Secondary | ICD-10-CM | POA: Diagnosis not present

## 2021-04-02 DIAGNOSIS — I4891 Unspecified atrial fibrillation: Secondary | ICD-10-CM

## 2021-04-02 DIAGNOSIS — I6523 Occlusion and stenosis of bilateral carotid arteries: Secondary | ICD-10-CM

## 2021-04-02 DIAGNOSIS — I25118 Atherosclerotic heart disease of native coronary artery with other forms of angina pectoris: Secondary | ICD-10-CM

## 2021-04-02 DIAGNOSIS — E782 Mixed hyperlipidemia: Secondary | ICD-10-CM | POA: Diagnosis not present

## 2021-04-02 DIAGNOSIS — I959 Hypotension, unspecified: Secondary | ICD-10-CM | POA: Diagnosis not present

## 2021-04-02 DIAGNOSIS — R0609 Other forms of dyspnea: Secondary | ICD-10-CM | POA: Diagnosis not present

## 2021-04-02 DIAGNOSIS — R079 Chest pain, unspecified: Secondary | ICD-10-CM | POA: Diagnosis not present

## 2021-04-02 DIAGNOSIS — J449 Chronic obstructive pulmonary disease, unspecified: Secondary | ICD-10-CM | POA: Diagnosis not present

## 2021-04-02 LAB — LIPID PANEL
Cholesterol: 128 mg/dL (ref 0–200)
HDL: 48 mg/dL (ref >40)
LDL Cholesterol: 58 mg/dL (ref 0–99)
Total CHOL/HDL Ratio: 2.7 RATIO
Triglycerides: 108 mg/dL (ref <150)
VLDL: 22 mg/dL (ref 0–40)

## 2021-04-02 LAB — ECHOCARDIOGRAM COMPLETE
AR max vel: 1.81 cm2
AV Area VTI: 2.05 cm2
AV Area mean vel: 1.59 cm2
AV Mean grad: 3 mmHg
AV Peak grad: 5 mmHg
Ao pk vel: 1.12 m/s
Area-P 1/2: 1.98 cm2
Height: 63 in
S' Lateral: 2.73 cm
Weight: 2049.4 oz

## 2021-04-02 LAB — MAGNESIUM: Magnesium: 1.8 mg/dL (ref 1.7–2.4)

## 2021-04-02 MED ORDER — PROPRANOLOL HCL 20 MG PO TABS: 20.0000 mg | ORAL_TABLET | Freq: Three times a day (TID) | ORAL | Status: DC | PRN

## 2021-04-02 MED ORDER — NITROGLYCERIN 0.4 MG SL SUBL: 0.4000 mg | SUBLINGUAL_TABLET | SUBLINGUAL | Status: DC | PRN

## 2021-04-02 MED ORDER — LOSARTAN POTASSIUM 50 MG PO TABS: 25.0000 mg | ORAL_TABLET | Freq: Every day | ORAL | Status: DC

## 2021-04-02 MED ORDER — LOSARTAN POTASSIUM 50 MG PO TABS
100.0000 mg | ORAL_TABLET | Freq: Every day | ORAL | Status: DC
  Administered 2021-04-02: 100 mg via ORAL
  Filled 2021-04-02: qty 2

## 2021-04-02 MED ORDER — LOSARTAN POTASSIUM 50 MG PO TABS: 100.0000 mg | ORAL_TABLET | Freq: Every day | ORAL | Status: DC

## 2021-04-02 MED ORDER — LOSARTAN POTASSIUM 50 MG PO TABS: 50.0000 mg | ORAL_TABLET | Freq: Every day | ORAL | Status: DC

## 2021-04-02 NOTE — Discharge Summary (Signed)
[facility] NAME: [name]    MR#:  [id number]  DATE OF BIRTH:  Aug 10, 1926  DATE OF ADMISSION:  04/01/2021 ADMITTING PHYSICIAN: Amy N Cox, DO  DATE OF DISCHARGE: 04/02/2021  PRIMARY CARE PHYSICIAN: [name], MD    ADMISSION DIAGNOSIS:  Hypotension [I95.9]  DISCHARGE DIAGNOSIS:  Hypotension--resolved Atypical Chest pain--ruled out MI SECONDARY DIAGNOSIS:   Past Medical History:  Diagnosis Date   C. difficile colitis    CAD (coronary artery disease)    a. s/p CABG 1999; b. Nuclear 10/11, no scar or ischemia, EF 68%; b. [place] 06/18/14: no evidence of infarction or ischemia, EF 77%, low risk study; c. 02/2019 [facility]: EF 56%, ? mild lat ischemia->low risk.   Carotid artery disease ([facility])    a. Doppler January, 2012, 40-59% bilateral; b. 02/2017 [facility]: 1-39% bilat ICA stenosis.   Closed fracture pubis ([place]) 07/04/2017   COPD (chronic obstructive pulmonary disease) (HCC)    Diffuse cystic mastopathy    Diverticulitis    last colonoscopy  incomplete Jan 2011   Dizziness    stable now (Oct 2011) patient tells me question of TIA, we will obtain records   Gait abnormality    Patient complains of "wobbly gait", December, 2013   GERD (gastroesophageal reflux disease)    History of migraines    Hx of CABG    a. 3 vessel in 1999   Hyperlipidemia    Hypertension    Indigestion    3 weeks, October 2011   Kidney stones    Rheumatic fever    Sinus bradycardia    Mild, December, 2013   SOB (shortness of breath)    Syncopal episodes    after 18 holes of golf and increase hydrochlorothiazide, resolved     HOSPITAL COURSE:  [name] is a 87 y.o. female with medical history significant for history of CAD status post bypass surgery 1999, remote history of tobacco use, quit in 1985, COPD, hypertension, hyperlipidemia, bilateral carotid arterial disease, anxiety, insomnia, presents to the emergency department for  chief concerns of weakness and hypotension.  chest pain appears atypical. Ruled out MI -- troponin times two negative. No acute EKG STT changes. -- patient denies any chest pain today -- continue her cardiac meds and PRN sublingual Nitro -- echo reviewed by Dr. Rockey Situ-- nothing acute. Okay to discharge home from cardiology standpoint. Patient is in agreement. -- discussed with daughter [name] on the phone.  History of CAD status post CABG -- continue cardiac meds  Hyperlipidemia -- atorvastatin  Hypertension -- resume losartan. -- patient's blood pressure improved after IV bolus fluid in the ER she is asymptomatic  overall hemodynamically stable. Will discharge patient to home with outpatient follow-up PCP and Dr. Rockey Situ  patient is DNR per daughter   CONSULTS OBTAINED:  Treatment Team:  Minna Merritts, MD  DRUG ALLERGIES:   Allergies  Allergen Reactions   Ciprofloxacin     ? Caused c diff     DISCHARGE MEDICATIONS:   Allergies as of 04/02/2021       Reactions   Ciprofloxacin    ? Caused c diff         Medication List     STOP taking these medications    magic mouthwash (nystatin, lidocaine, diphenhydrAMINE, alum & mag hydroxide) suspension       TAKE these medications    atorvastatin 40 MG tablet Commonly known as: LIPITOR TAKE ONE TABLET  BY MOUTH EVERY DAY   clopidogrel 75 MG tablet Commonly known as: PLAVIX TAKE 1 TABLET BY MOUTH DAILY   Cranberry 125 MG Tabs Take 1 tablet by mouth daily.   esomeprazole 40 MG capsule Commonly known as: NEXIUM Take 40 mg by mouth daily at 12 noon.   Ipratropium-Albuterol 20-100 MCG/ACT Aers respimat Commonly known as: COMBIVENT Inhale 1 puff into the lungs every 6 (six) hours as needed for wheezing.   isosorbide mononitrate 60 MG 24 hr tablet Commonly known as: IMDUR Take 1.5 tablets (90 mg total) by mouth 2 (two) times daily.   losartan 100 MG tablet Commonly known as: COZAAR TAKE 1 TABLET BY  MOUTH DAILY   nitroGLYCERIN 0.4 MG SL tablet Commonly known as: NITROSTAT PLACE 1 TABLET UNDER TONGUE EVERY 5 MIN AS NEEDED FOR CHEST PAIN IF NO RELIEF IN15 MIN CALL 911 (MAX 3 TABS)   PROBIOTIC PO Take 1 capsule by mouth daily.   ranolazine 500 MG 12 hr tablet Commonly known as: RANEXA TAKE 1 TABLET BY MOUTH TWICE DAILY   vitamin B-12 500 MCG tablet Commonly known as: CYANOCOBALAMIN Take 500 mcg by mouth daily.       ASK your doctor about these medications    propranolol 20 MG tablet Commonly known as: INDERAL TAKE ONE TABLET 3 TIMES DAILY AS NEEDED FOR TACHYCARDIA        If you experience worsening of your admission symptoms, develop shortness of breath, life threatening emergency, suicidal or homicidal thoughts you must seek medical attention immediately by calling 911 or calling your MD immediately  if symptoms less severe.  You Must read complete instructions/literature along with all the possible adverse reactions/side effects for all the Medicines you take and that have been prescribed to you. Take any new Medicines after you have completely understood and accept all the possible adverse reactions/side effects.   Please note  You were cared for by a hospitalist during your hospital stay. If you have any questions about your discharge medications or the care you received while you were in the hospital after you are discharged, you can call the unit and asked to speak with the hospitalist on call if the hospitalist that took care of you is not available. Once you are discharged, your primary care physician will handle any further medical issues. Please note that NO REFILLS for any discharge medications will be authorized once you are discharged, as it is imperative that you return to your primary care physician (or establish a relationship with a primary care physician if you do not have one) for your aftercare needs so that they can reassess your need for medications and  monitor your lab values. Today   SUBJECTIVE   no new complaints  VITAL SIGNS:  Blood pressure (!) 156/58, pulse 67, temperature 98.7 F (37.1 C), temperature source Oral, resp. rate 16, height 5\' 3"  (1.6 m), weight 58.1 kg, SpO2 96 %.  I/O:   Intake/Output Summary (Last 24 hours) at 04/02/2021 1429 Last data filed at 04/02/2021 0111 Gross per 24 hour  Intake 100 ml  Output --  Net 100 ml    PHYSICAL EXAMINATION:  GENERAL:  87 y.o.-year-old patient lying in the bed with no acute distress.  LUNGS: Normal breath sounds bilaterally, no wheezing, rales,rhonchi or crepitation. No use of accessory muscles of respiration.  CARDIOVASCULAR: S1, S2 normal. No murmurs, rubs, or gallops.  ABDOMEN: Soft, non-tender, non-distended. Bowel sounds present. No organomegaly or mass.  EXTREMITIES: No pedal edema, cyanosis,  or clubbing.  PSYCHIATRIC: The patient is alert and oriented x 3.  SKIN: No obvious rash, lesion, or ulcer.   DATA REVIEW:   CBC  Recent Labs  Lab 04/01/21 1558  WBC 7.0  HGB 12.2  HCT 36.1  PLT 196    Chemistries  Recent Labs  Lab 04/01/21 1558 04/02/21 0543  NA 138  --   K 4.7  --   CL 101  --   CO2 28  --   GLUCOSE 122*  --   BUN 23  --   CREATININE 1.45*  --   CALCIUM 8.9  --   MG  --  1.8    Microbiology Results   Recent Results (from the past 240 hour(s))  Resp Panel by RT-PCR (Flu A&B, Covid) Nasopharyngeal Swab     Status: None   Collection Time: 04/01/21  9:59 PM   Specimen: Nasopharyngeal Swab; Nasopharyngeal(NP) swabs in vial transport medium  Result Value Ref Range Status   SARS Coronavirus 2 by RT PCR NEGATIVE NEGATIVE Final    Comment: (NOTE) SARS-CoV-2 target nucleic acids are NOT DETECTED.  The SARS-CoV-2 RNA is generally detectable in upper respiratory specimens during the acute phase of infection. The lowest concentration of SARS-CoV-2 viral copies this assay can detect is 138 copies/mL. A negative result does not preclude  SARS-Cov-2 infection and should not be used as the sole basis for treatment or other patient management decisions. A negative result may occur with  improper specimen collection/handling, submission of specimen other than nasopharyngeal swab, presence of viral mutation(s) within the areas targeted by this assay, and inadequate number of viral copies(<138 copies/mL). A negative result must be combined with clinical observations, patient history, and epidemiological information. The expected result is Negative.  Fact Sheet for Patients:  EntrepreneurPulse.com.au  Fact Sheet for Healthcare Providers:  IncredibleEmployment.be  This test is no t yet approved or cleared by the [place] FDA and  has been authorized for detection and/or diagnosis of SARS-CoV-2 by FDA under an Emergency Use Authorization (EUA). This EUA will remain  in effect (meaning this test can be used) for the duration of the COVID-19 declaration under Section 564(b)(1) of the Act, 21 U.S.C.section 360bbb-3(b)(1), unless the authorization is terminated  or revoked sooner.       Influenza A by PCR NEGATIVE NEGATIVE Final   Influenza B by PCR NEGATIVE NEGATIVE Final    Comment: (NOTE) The Xpert Xpress SARS-CoV-2/FLU/RSV plus assay is intended as an aid in the diagnosis of influenza from Nasopharyngeal swab specimens and should not be used as a sole basis for treatment. Nasal washings and aspirates are unacceptable for Xpert Xpress SARS-CoV-2/FLU/RSV testing.  Fact Sheet for Patients: EntrepreneurPulse.com.au  Fact Sheet for Healthcare Providers: IncredibleEmployment.be  This test is not yet approved or cleared by the [place] FDA and has been authorized for detection and/or diagnosis of SARS-CoV-2 by FDA under an Emergency Use Authorization (EUA). This EUA will remain in effect (meaning this test can be used) for the duration of  the COVID-19 declaration under Section 564(b)(1) of the Act, 21 U.S.C. section 360bbb-3(b)(1), unless the authorization is terminated or revoked.  Performed at [facility], [address] [place], [place] [address]     RADIOLOGY:  DG Chest 2 View  Result Date: 04/01/2021 CLINICAL DATA:  Shortness of breath and low blood pressure. EXAM: CHEST - 2 VIEW COMPARISON:  01/20/2021 FINDINGS: Hyperinflation. Prior median sternotomy. Patient rotated right. Mild cardiomegaly. Atherosclerosis in the transverse aorta. Moderate  to large hiatal hernia. Left apical pleuroparenchymal scarring. Diffuse peribronchial thickening. No lobar consolidation. IMPRESSION: Hyperinflation and interstitial thickening, likely related to COPD/chronic bronchitis. No acute superimposed process. Hiatal hernia Aortic Atherosclerosis (ICD10-I70.0). Electronically Signed   By: Abigail Miyamoto M.D.   On: 04/01/2021 16:44     CODE STATUS:     Code Status Orders  (From admission, onward)           Start     Ordered   04/01/21 2308  Do not attempt resuscitation (DNR)  Continuous       Question Answer Comment  In the event of cardiac or respiratory ARREST Do not call a "code blue"   In the event of cardiac or respiratory ARREST Do not perform Intubation, CPR, defibrillation or ACLS   In the event of cardiac or respiratory ARREST Use medication by any route, position, wound care, and other measures to relive pain and suffering. May use oxygen, suction and manual treatment of airway obstruction as needed for comfort.      04/01/21 2307           Code Status History     Date Active Date Inactive Code Status Order ID Comments User Context   04/01/2021 2036 04/01/2021 2307 Full Code [id number]  Criss Alvine, DO ED   06/18/2019 0347 06/18/2019 1829 Full Code [id number]  Lang Snow, NP ED   09/07/2017 1542 09/08/2017 1621 Full Code [id number]  Nicholes Mango, MD Inpatient   03/08/2017 1846 03/10/2017 1604 Full  Code [id number]  Gladstone Lighter, MD Inpatient   12/21/2016 2159 12/23/2016 1342 Full Code [id number]  Gladstone Lighter, MD ED      Advance Directive Documentation    Flowsheet Row Most Recent Value  Type of Advance Directive Healthcare Power of Attorney, Living will  Pre-existing out of facility DNR order (yellow form or pink MOST form) --  "MOST" Form in Place? --        TOTAL TIME TAKING CARE OF THIS PATIENT: 35 minutes.    Fritzi Mandes M.D  [facility]    CC: Primary care physician; [name], MD

## 2021-04-02 NOTE — ED Notes (Signed)
Discharge instructions reviewed with patient and daughter. Denies any needs or questions at this time. Patient ambulatory to wheelchair to be transported out of hospital into POV.

## 2021-04-02 NOTE — Progress Notes (Signed)
*  PRELIMINARY RESULTS* Echocardiogram 2D Echocardiogram has been performed.  [name] 04/02/2021, 1:27 PM

## 2021-04-02 NOTE — ED Notes (Signed)
Lunch tray given. 

## 2021-04-02 NOTE — ED Notes (Signed)
Patient ambulatory to restroom  ?

## 2021-04-02 NOTE — Consult Note (Signed)
Cardiology Consultation:   Patient ID: [name] MRN: [id number]; DOB: Jul 21, 1926  Admit date: 04/01/2021 Date of Consult: 04/02/2021  PCP:  [name], MD   [place]  Cardiologist:  Ida Rogue, MD  Advanced Practice Provider:  No care team member to display Electrophysiologist:  None [id number]    Patient Profile:   [name] is a 87 y.o. female with a hx of CAD s/p CABG in 1999, hypertension, hyperlipidemia, mild carotid CKD, disease (40 to 50%), Cdiff 2015, fall 02/2017 with pelvic fx/L shoulder pain, history of COVID-19, and COPD s/p tobacco use for 40 years (quit 1985), and who is being seen today for the evaluation of hypotension and chest pain at the request of Dr. Posey Pronto.  History of Present Illness:   [name] 87 year old female with history of CAD s/p CABG in 1999, hypertension, hyperlipidemia, mild carotid CKD, disease (40 to 50%), Cdiff 2015, fall 02/2017 with pelvic fx, and COPD s/p tobacco use for 40 years (quit 1985).  She was previously treated with chemotherapy for cancer of the right lower extremity.  She has a history of intermittent angina, for which she takes sublingual nitro, Imdur, and Ranexa.  02/2019 stress testing in the setting of chest pain low risk with question of mild lateral ischemia.  H/o substernal chest discomfort, typically occuring while lying down, and lasting 5 to 40 minutes. CP resolved with sitting up or after sublingual nitro.  Sx would occur as frequently as multiple times per week.  Despite CP, she was noted to be able to participate in rehab and walk/exercise without significant chest pain or dyspnea.    12/2019 echo with EF 50 to 55%, NR WMA, G1 DD.   She was last seen by her primary cardiologist 10/12/2020 for 64-month follow-up.  She had caught COVID-19 over the Christmas holiday with a difficult recovery and subsequent low energy / sedentary lifestyle.  She was not playing much golf.  CP had  improved, and she had not needed much SL nitro. It was noted she had declined future stress test / catheterization.  She reported rare fluttering in her chest at night that alleviated with as needed propanolol.She did not wish to increase to daily BB.  Over the last 10 days, she reports episodes of atypical chest discomfort, attributed to MSK etiology. CP described as a tender soreness of her lateral left side.  This chest pain is described as tender to palpation and positional. It is "not consistent." Chest discomfort can be elicited by moving her arm around in different directions.  She states that it feels as if she pulled a muscle.  She is unable to recall if she has participated in any heavy lifting or stretched in a certain way to strain the muscle.  It does not feel similar to chest pain reported in the past.  At the time of cardiology consultation, she reports current chest pain that is tender to palpation when she mashes on her chest.  She moves her arm around, which changes the nature of the chest pain and makes it more significant.  She is uncertain if the chest pain is worse with deeper breathing, as it dissipates before she is able to try on exam.  She denies any clear exacerbating or alleviating triggers.  No associated symptoms.  No symptoms of volume overload.  She reports lower extremity edema in the past but not current.  She states she initially was not concerned about her CP as  above; however, she became increasingly concerned when her BP was reportedly low at physical therapy, leading to her presenting to [facility] emergency department.  Prior to physical therapy, she noticed some issues with her vision.  She was not seeing as clearly. She stated she was tired and weak, unchanged /ongoing finding since her COVID-19 illness.  She reports that she still is unable to play golf.  At physical therapy, she reportedly had a systolic in the 84X.  The patient reports that her blood pressure was taken  4 times to confirm this low reading.  Given her 10 days of chest pain as described above, as well as her SBP in the 70s, she decided to present to [facility] emergency department.    She does report that she has had some lower BP readings, and because of this, her PCP stopped her amlodipine sometime ago.    Since in the emergency department, her BP has been elevated.  She has been on IV saline and reports a few bites of a breakfast country ham biscuit, which may be contributing.  On review of medications, losartan and as needed beta-blocker currently held in the emergency department.    She reports drinking lots of water to prevent dehydration. In the ED, UA positive for moderate leukocytosis with patient started on IV antibiotics.    Initial labs unremarkable and showed Cr at baseline when compared with previous labs (Cr 1.45), H&H stable from previous, HS Tn 10x2, TSH wnl, LDL well controlled at 58. EKG SR, 76 bpm, low voltage, repolarization abnormalities, QTC 495, baseline wander.   Echo ordered, pending.  We did discuss possible further ischemic work-up, if EF reduced/wall motion abnormality.  The patient reports that she would like to avoid stress testing if possible, as she had a brother that passed away while undergoing a stress test at that the hospital.  Past Medical History:  Diagnosis Date   C. difficile colitis    CAD (coronary artery disease)    a. s/p CABG 1999; b. Nuclear 10/11, no scar or ischemia, EF 68%; b. [place] 06/18/14: no evidence of infarction or ischemia, EF 77%, low risk study; c. 02/2019 MV: EF 56%, ? mild lat ischemia->low risk.   Carotid artery disease ([place])    a. Doppler January, 2012, 40-59% bilateral; b. 02/2017 U/S: 1-39% bilat ICA stenosis.   Closed fracture pubis ([place]) 07/04/2017   COPD (chronic obstructive pulmonary disease) (HCC)    Diffuse cystic mastopathy    Diverticulitis    last colonoscopy  incomplete Jan 2011   Dizziness    stable now (Oct 2011)  patient tells me question of TIA, we will obtain records   Gait abnormality    Patient complains of "wobbly gait", December, 2013   GERD (gastroesophageal reflux disease)    History of migraines    Hx of CABG    a. 3 vessel in 1999   Hyperlipidemia    Hypertension    Indigestion    3 weeks, October 2011   Kidney stones    Rheumatic fever    Sinus bradycardia    Mild, December, 2013   SOB (shortness of breath)    Syncopal episodes    after 18 holes of golf and increase hydrochlorothiazide, resolved     Past Surgical History:  Procedure Laterality Date   [place]     multiple BIL   CATARACT EXTRACTION Right 2009   CATARACT EXTRACTION  Left 2011   COLONOSCOPY  [id number]   Dr. Jamal Collin   CORONARY ARTERY BYPASS GRAFT  1999   esophagus stretched     [place]   due to metrorrhagia     Home Medications:  Prior to Admission medications   Medication Sig Start Date End Date Taking? Authorizing Provider  atorvastatin (LIPITOR) 40 MG tablet TAKE ONE TABLET BY MOUTH EVERY DAY 11/09/20  Yes [name], MD  clopidogrel (PLAVIX) 75 MG tablet TAKE 1 TABLET BY MOUTH DAILY 03/12/21  Yes Gollan, Kathlene November, MD  Cranberry 125 MG TABS Take 1 tablet by mouth daily.   Yes [provider]  esomeprazole (NEXIUM) 40 MG capsule Take 40 mg by mouth daily at 12 noon.   Yes [provider]  Ipratropium-Albuterol (COMBIVENT) 20-100 MCG/ACT AERS respimat Inhale 1 puff into the lungs every 6 (six) hours as needed for wheezing. 02/05/20  Yes Minna Merritts, MD  isosorbide mononitrate (IMDUR) 60 MG 24 hr tablet Take 1.5 tablets (90 mg total) by mouth 2 (two) times daily. 04/07/20  Yes Minna Merritts, MD  losartan (COZAAR) 100 MG tablet TAKE 1 TABLET BY MOUTH DAILY 03/12/21  Yes Gollan, Kathlene November, MD  Probiotic Product (PROBIOTIC PO) Take 1 capsule by mouth daily.   Yes [provider]  ranolazine (RANEXA) 500 MG 12 hr tablet TAKE 1 TABLET BY MOUTH TWICE DAILY 03/12/21  Yes Gollan, Kathlene November, MD  vitamin B-12 (CYANOCOBALAMIN) 500 MCG tablet Take 500 mcg by mouth daily.   Yes [provider]  magic mouthwash (nystatin, lidocaine, diphenhydrAMINE, alum & mag hydroxide) suspension Mix ingredients 1:1:1:1 Swish 64ml twice daily for th next 14 days Patient not taking: No sig reported 01/25/21   Harvest Dark, MD  nitroGLYCERIN (NITROSTAT) 0.4 MG SL tablet PLACE 1 TABLET UNDER TONGUE EVERY 5 MIN AS NEEDED FOR CHEST PAIN IF NO RELIEF IN15 MIN CALL 911 (MAX 3 TABS) 04/07/20   Gollan, Kathlene November, MD  propranolol (INDERAL) 20 MG tablet TAKE ONE TABLET 3 TIMES DAILY AS NEEDED FOR TACHYCARDIA Patient not taking: No sig reported 04/07/20   Minna Merritts, MD    Inpatient Medications: Scheduled Meds:  atorvastatin  40 mg Oral Daily   clopidogrel  75 mg Oral Daily   enoxaparin (LOVENOX) injection  30 mg Subcutaneous Q24H   isosorbide mononitrate  90 mg Oral BID   pantoprazole  80 mg Oral Q1200   ranolazine  500 mg Oral BID   vitamin B-12  500 mcg Oral Daily   Continuous Infusions:  sodium chloride 125 mL/hr at 04/02/21 0012   cefTRIAXone (ROCEPHIN)  IV Stopped (04/02/21 0111)   PRN Meds: acetaminophen, nitroGLYCERIN, ondansetron (ZOFRAN) IV  Allergies:    Allergies  Allergen Reactions   Ciprofloxacin     ? Caused c diff     Social History:   Social History   Socioeconomic History   Marital status: Widowed    Spouse name: Not on file   Number of children: 2   Years of education: Not on file   Highest education level: Not on file  Occupational History    Employer: RETIRED  Tobacco Use   Smoking status: Former    Packs/day: 0.50    Years: 40.00    Pack years: 20.00    Types: Cigarettes    Quit date: 09/20/1983    Years since quitting: 37.5   Smokeless tobacco: Never  Vaping Use  Vaping Use: Never used  Substance and Sexual Activity    Alcohol use: Not Currently    Comment: yes, social wine   Drug use: No   Sexual activity: Never  Other Topics Concern   Not on file  Social History Narrative   Lives by herself, ambulates independently at baseline   Social Determinants of Health   Financial Resource Strain: Medium Risk   Difficulty of Paying Living Expenses: Somewhat hard  Food Insecurity: No Food Insecurity   Worried About Charity fundraiser in the Last Year: Never true   Ran Out of Food in the Last Year: Never true  Transportation Needs: No Transportation Needs   Lack of Transportation (Medical): No   Lack of Transportation (Non-Medical): No  Physical Activity: Not on file  Stress: Not on file  Social Connections: Unknown   Frequency of Communication with Friends and Family: Once a week   Frequency of Social Gatherings with Friends and Family: Not on file   Attends Religious Services: Not on Electrical engineer or Organizations: Not on file   Attends Archivist Meetings: Not on file   Marital Status: Not on file  Intimate Partner Violence: Not At Risk   Fear of Current or Ex-Partner: No   Emotionally Abused: No   Physically Abused: No   Sexually Abused: No    Family History:    Family History  Problem Relation Age of Onset   Heart disease Mother    Heart disease Father    Cancer Neg Hx    Drug abuse Neg Hx    Breast cancer Neg Hx      ROS:  Please see the history of present illness.  Review of Systems  Constitutional:  Positive for malaise/fatigue.  Eyes:  Positive for blurred vision.       Blurred vision with low BP  Respiratory:  Negative for shortness of breath.   Cardiovascular:  Positive for chest pain and leg swelling. Negative for palpitations and orthopnea.       Reports a history of LEE, though not current  Gastrointestinal:  Negative for abdominal pain, nausea and vomiting.  Genitourinary:  Positive for frequency.  Neurological:  Positive for weakness. Negative  for dizziness, focal weakness and loss of consciousness.   All other ROS reviewed and negative.     Physical Exam/Data:   Vitals:   04/02/21 0500 04/02/21 0530 04/02/21 0730 04/02/21 0745  BP: (!) 158/76 (!) 135/53 (!) 163/63 (!) 163/69  Pulse: 70 75 73 75  Resp: 18 16 18 20   Temp:      TempSrc:      SpO2: 94% 92% 95% 96%  Weight:      Height:        Intake/Output Summary (Last 24 hours) at 04/02/2021 0910 Last data filed at 04/02/2021 0111 Gross per 24 hour  Intake 100 ml  Output --  Net 100 ml   Last 3 Weights 04/01/2021 02/17/2021 02/01/2021  Weight (lbs) 128 lb 1.4 oz 128 lb 122 lb 3.2 oz  Weight (kg) 58.1 kg 58.06 kg 55.43 kg     Body mass index is 22.69 kg/m.  General: Frail female, no acute distress, joined by her daughter HEENT: normal Lymph: no adenopathy Neck: no JVD Endocrine:  No thryomegaly Vascular: No carotid bruits; radial pulses 2+ bilaterally  Cardiac:  normal S1, S2; RRR; 1/6 systolic murmur Lungs:  clear to auscultation bilaterally, no wheezing, rhonchi or rales  Abd: soft,  nontender, no hepatomegaly  Ext: no edema Musculoskeletal:  No deformities, BUE and BLE strength normal and equal Skin: warm and dry  Neuro:  CNs 2-12 intact, no focal abnormalities noted Psych:  Normal affect   EKG:  The EKG was personally reviewed and demonstrates:  EKG SR, 76 bpm, low voltage, repolarization abnormalities, QTC 495, baseline wander Telemetry:  Telemetry was personally reviewed and demonstrates:  NSR with occasional PACs   Relevant CV Studies: Echo 12/2019  1. Left ventricular ejection fraction, by estimation, is 50 to 55%. The  left ventricle has low normal function. The left ventricle has no regional  wall motion abnormalities. Left ventricular diastolic parameters are  consistent with Grade I diastolic  dysfunction (impaired relaxation).   2. Right ventricular systolic function is normal. The right ventricular  size is normal. There is normal pulmonary  artery systolic pressure.   3. The mitral valve is normal in structure. Trivial mitral valve  regurgitation.   4. The aortic valve is tricuspid. Aortic valve regurgitation is not  visualized.   5. The inferior vena cava is normal in size with greater than 50%  respiratory variability, suggesting right atrial pressure of 3 mmHg.   MPI 02/2019 Pharmacological myocardial perfusion imaging study with no significant ischemia Unable to exclude mild ischemia lateral wall on non-attenuation corrected images Normal wall motion, EF estimated at 56% EKG with equivocal ST abnormality V2 through V5 following infusion of lexiscan.  Patient did report chest pain following infusion. Rare PVC noted Moderate risk scan , secondary to equivocal EKG changes, chest pain during the study    Laboratory Data:  High Sensitivity Troponin:   Recent Labs  Lab 04/01/21 1558 04/01/21 1828  TROPONINIHS 10 10     Chemistry Recent Labs  Lab 04/01/21 1558  NA 138  K 4.7  CL 101  CO2 28  GLUCOSE 122*  BUN 23  CREATININE 1.45*  CALCIUM 8.9  GFRNONAA 34*  ANIONGAP 9    No results for input(s): PROT, ALBUMIN, AST, ALT, ALKPHOS, BILITOT in the last 168 hours. Hematology Recent Labs  Lab 04/01/21 1558  WBC 7.0  RBC 3.51*  HGB 12.2  HCT 36.1  MCV 102.8*  MCH 34.8*  MCHC 33.8  RDW 12.0  PLT 196   BNPNo results for input(s): BNP, PROBNP in the last 168 hours.  DDimer No results for input(s): DDIMER in the last 168 hours.   Radiology/Studies:  DG Chest 2 View  Result Date: 04/01/2021 CLINICAL DATA:  Shortness of breath and low blood pressure. EXAM: CHEST - 2 VIEW COMPARISON:  01/20/2021 FINDINGS: Hyperinflation. Prior median sternotomy. Patient rotated right. Mild cardiomegaly. Atherosclerosis in the transverse aorta. Moderate to large hiatal hernia. Left apical pleuroparenchymal scarring. Diffuse peribronchial thickening. No lobar consolidation. IMPRESSION: Hyperinflation and interstitial  thickening, likely related to COPD/chronic bronchitis. No acute superimposed process. Hiatal hernia Aortic Atherosclerosis (ICD10-I70.0). Electronically Signed   By: Abigail Miyamoto M.D.   On: 04/01/2021 16:44     Assessment and Plan:   Atypical chest pain with history of CAD -- Atypical chest pain that is initially present during exam dissipates.  Chest pain is tender to palpation, positional, and most distant with MSK etiology.   --EKG without acute ST/T changes. HS Tn 10, 10.  Not consistent with ACS. --Echo ordered, pending.  If echo without reduced EF or wall motion abnormalities, we can likely discharge home with follow-up as already scheduled with her primary cardiologist in the office --Continue PTA medications, including clopidogrel, statin,  PRN SL nitro, Ranexa, Imdur, Losartan.  Recommend ongoing medical management and risk factor modification.  Hypertension, reported hypotension --BP suboptimal at 156/58.  She was started on IV fluids with BP medication held at presentation, due to report of SBP into the 60s per physical therapist.  On review of her BP since admitted, SBP has consistently been elevated with 1 dipped to SBP 98.  Suspect dip in SBP due to taking BP at same time as PACs seen on telemetry.  Restarted losartan 100 mg daily. Continue Imdur, PRN BB / SL nitro.  Restart PTA as needed beta-blocker.  Ideally, she would benefit from taking this medication on a daily basis, though she has declined this in the past. Educated patient that SBP may dip if pressure is taken at the same time as ectopy, and recommendation would be to repeat her pressure in approximately 5 minutes to confirm the value.  HLD --LDL at goal. Continue statin.  COPD --Continue inhalers. Ongoing smoking cessation (quit 1985)  GERD --Continue PPI  For questions or updates, please contact [place] Please consult [email or web address] for contact info under    Signed, Arvil Chaco, PA-C  04/02/2021  9:10 AM

## 2021-04-02 NOTE — ED Notes (Signed)
Patient complaining of intermittent chest pain. States it feels like it is fluttering. No changes noted on monitor. EKG obtained. Gave patient tylenol per order. States it hurts when being pressed on.

## 2021-04-05 ENCOUNTER — Ambulatory Visit: Payer: Medicare Other

## 2021-04-05 ENCOUNTER — Telehealth: Payer: Self-pay

## 2021-04-05 NOTE — Telephone Encounter (Signed)
Transition Care Management Follow-up Telephone Call Date of discharge and from where: 04/02/21 from [facility] How have you been since you were released from the hospital? Patient states," I am fine." Denies dizziness, pain, falls, shortness of breath, over fatigue.  Any questions or concerns? No  Items Reviewed: Did the pt receive and understand the discharge instructions provided? Yes  Medications obtained and verified? Yes  Other? No  Any new allergies since your discharge? No Dietary orders reviewed? Yes Do you have support at home? Yes   Home Care and Equipment/Supplies: Were home health services ordered? No  Functional Questionnaire: (I = Independent and D = Dependent) ADLs: I  Eating- I  Transferring/Ambulation- cane/walker  Managing Meds- I  Follow up appointments reviewed:  PCP Hospital f/u appt confirmed? Yes  Scheduled to see Dr. Derrel Nip on  04/06/21 @ 3:00. [place] [facility] f/u appt confirmed? Yes  Scheduled to see Cardiology 04/12/21.   Are transportation arrangements needed? No  If their condition worsens, is the pt aware to call PCP or go to the Emergency Dept.? Yes Was the patient provided with contact information for the PCP's office or ED? Yes Was to pt encouraged to call back with questions or concerns? Yes

## 2021-04-06 ENCOUNTER — Encounter: Payer: Self-pay | Admitting: Internal Medicine

## 2021-04-06 ENCOUNTER — Ambulatory Visit: Payer: Medicare Other

## 2021-04-06 ENCOUNTER — Other Ambulatory Visit: Payer: Self-pay

## 2021-04-06 ENCOUNTER — Encounter: Payer: Medicare Other | Admitting: Physical Therapy

## 2021-04-06 ENCOUNTER — Ambulatory Visit (INDEPENDENT_AMBULATORY_CARE_PROVIDER_SITE_OTHER): Payer: Medicare Other | Admitting: Internal Medicine

## 2021-04-06 VITALS — BP 130/60 | HR 61 | Temp 96.0°F | Resp 16 | Ht 63.0 in | Wt 122.6 lb

## 2021-04-06 DIAGNOSIS — F419 Anxiety disorder, unspecified: Secondary | ICD-10-CM | POA: Diagnosis not present

## 2021-04-06 DIAGNOSIS — E538 Deficiency of other specified B group vitamins: Secondary | ICD-10-CM

## 2021-04-06 DIAGNOSIS — F5105 Insomnia due to other mental disorder: Secondary | ICD-10-CM | POA: Diagnosis not present

## 2021-04-06 DIAGNOSIS — I10 Essential (primary) hypertension: Secondary | ICD-10-CM | POA: Diagnosis not present

## 2021-04-06 DIAGNOSIS — R079 Chest pain, unspecified: Secondary | ICD-10-CM | POA: Diagnosis not present

## 2021-04-06 DIAGNOSIS — R0789 Other chest pain: Secondary | ICD-10-CM | POA: Diagnosis not present

## 2021-04-06 DIAGNOSIS — I6523 Occlusion and stenosis of bilateral carotid arteries: Secondary | ICD-10-CM

## 2021-04-06 DIAGNOSIS — N1832 Chronic kidney disease, stage 3b: Secondary | ICD-10-CM | POA: Diagnosis not present

## 2021-04-06 DIAGNOSIS — R8281 Pyuria: Secondary | ICD-10-CM | POA: Diagnosis not present

## 2021-04-06 DIAGNOSIS — R0602 Shortness of breath: Secondary | ICD-10-CM | POA: Diagnosis not present

## 2021-04-06 MED ORDER — ALPRAZOLAM 0.25 MG PO TABS: 0.2500 mg | ORAL_TABLET | Freq: Every evening | ORAL | 2 refills | Status: AC | PRN

## 2021-04-06 MED ORDER — CYANOCOBALAMIN 1000 MCG/ML IJ SOLN
1000.0000 ug | Freq: Once | INTRAMUSCULAR | Status: AC
  Administered 2021-04-06: 1000 ug via INTRAMUSCULAR

## 2021-04-06 NOTE — Assessment & Plan Note (Signed)
Giving IM injection today for low energy.  If she feels an improvement,  Will resume injections at drugstore.

## 2021-04-06 NOTE — Progress Notes (Signed)
Subjective:  Patient ID: [name], Allen    DOB: [birth year]  Age: 87 y.o. MRN: [id number]  CC: The primary encounter diagnosis was Chest pain, unspecified type. Diagnoses of B12 deficiency, Essential (primary) hypertension, Atypical chest pain, SOB (shortness of breath), Stage 3b chronic kidney disease ([name]), Sterile pyuria, and Insomnia secondary to anxiety were also pertinent to this visit.  HPI [name] for ER follow up  This visit occurred during the SARS-CoV-2 public health emergency.  Safety protocols were in place, including screening questions prior to the visit, additional usage of staff PPE, and extensive cleaning of exam room while observing appropriate contact time as indicated for disinfecting solutions.    [name],  CAD with normal EF/diastolic dysfunction who was evaluated and treated at [facility] on  July 14 for  hypotension, dyspnea and generalized weakness.  She states that she started feeling weak during an outpatient PT session that occurred in the afternoon after Allen busy morning of shopping and running errands with her daughter.  She was found to be profoundly hypotensive and referred to the ER for evaluation and treatment.  She was offered but refused admission. She ruled out for AMI with  negative troponins and EKG which was unchanged from may 2022.  She was hydrated with IV fluids and hypotension resolved . She was treated with IV ceftriaxone for Allen UTI because of  pyuria on Allen UA, but she denies Allen history of dysuria,  and has had c dificile colitis multiple times. No culture was done.  She feels generally well today but notes that she is easily fatigued.  She has recurrent atypical chest pain occurring at rest ,  and uses SL nitro,  last one was taken today before coming to the office. She has not checked her BP since ER discharge.   She continues to feel low energy,  takes B12 supplements.  Using melatonin at  8 pm but wakes up at 2 am most nights and is unable to return to sleep   Follow up on  aortic atherosclerosis  .  Diagnosis reviewed with patient.  She has been taking atorvastatin for years and tolerating it.  Reviewed ER labs .  Reviewed CKD and the decision she made at last visit to decline nephrology evaluation   Outpatient Medications Prior to Visit  Medication Sig Dispense Refill   atorvastatin (LIPITOR) 40 MG tablet TAKE ONE TABLET BY MOUTH EVERY DAY 90 tablet 1   clopidogrel (PLAVIX) 75 MG tablet TAKE 1 TABLET BY MOUTH DAILY 90 tablet 0   Cranberry 125 MG TABS Take 1 tablet by mouth daily.     esomeprazole (NEXIUM) 40 MG capsule Take 40 mg by mouth daily at 12 noon.     Ipratropium-Albuterol (COMBIVENT) 20-100 MCG/ACT AERS respimat Inhale 1 puff into the lungs every 6 (six) hours as needed for wheezing. 4 g 6   isosorbide mononitrate (IMDUR) 60 MG 24 hr tablet Take 1.5 tablets (90 mg total) by mouth 2 (two) times daily. 270 tablet 3   losartan (COZAAR) 100 MG tablet TAKE 1 TABLET BY MOUTH DAILY 90 tablet 0   nitroGLYCERIN (NITROSTAT) 0.4 MG SL tablet PLACE 1 TABLET UNDER TONGUE EVERY 5 MIN AS NEEDED FOR CHEST PAIN IF NO RELIEF IN15 MIN CALL 911 (MAX 3 TABS) 25 tablet 3   Probiotic Product (PROBIOTIC PO) Take 1 capsule by mouth daily.     propranolol (INDERAL) 20  MG tablet TAKE ONE TABLET 3 TIMES DAILY AS NEEDED FOR TACHYCARDIA 270 tablet 3   ranolazine (RANEXA) 500 MG 12 hr tablet TAKE 1 TABLET BY MOUTH TWICE DAILY 180 tablet 0   vitamin B-12 (CYANOCOBALAMIN) 500 MCG tablet Take 500 mcg by mouth daily.     No facility-administered medications prior to visit.    Review of Systems;  Patient denies headache, fevers, malaise, unintentional weight loss, skin rash, eye pain, sinus congestion and sinus pain, sore throat, dysphagia,  hemoptysis , cough, dyspneanocturia, numbness, tingling, seizures,  Focal weakness, Loss of consciousness,  Tremor, depression, anxiety, and suicidal  ideation.      Objective:  BP 130/60 (BP Location: Left Arm, Patient Position: Sitting, Cuff Size: Normal)   Pulse 61   Temp (!) 96 F (35.6 C) (Temporal)   Resp 16   Ht 5\' 3"  (1.6 m)   Wt 122 lb 9.6 oz (55.6 kg)   SpO2 95%   BMI 21.72 kg/m   BP Readings from Last 3 Encounters:  04/06/21 130/60  04/02/21 134/60  02/17/21 (!) 129/59    Wt Readings from Last 3 Encounters:  04/06/21 122 lb 9.6 oz (55.6 kg)  04/01/21 128 lb 1.4 oz (58.1 kg)  02/17/21 128 lb (58.1 kg)    General appearance: alert, cooperative and appears stated age Ears: normal TM's and external ear canals both ears Throat: lips, mucosa, and tongue normal; teeth and gums normal Neck: no adenopathy, no carotid bruit, supple, symmetrical, trachea midline and thyroid not enlarged, symmetric, no tenderness/mass/nodules Back: symmetric, no curvature. ROM normal. No CVA tenderness. Lungs: clear to auscultation bilaterally Heart: regular rate and rhythm, S1, S2 normal, no murmur, click, rub or gallop Abdomen: soft, non-tender; bowel sounds normal; no masses,  no organomegaly Pulses: 2+ and symmetric Skin: Skin color, texture, turgor normal. No rashes or lesions Lymph nodes: Cervical, supraclavicular, and axillary nodes normal.  Lab Results  Component Value Date   HGBA1C 5.8 01/30/2017   HGBA1C 5.8 09/26/2016    Lab Results  Component Value Date   CREATININE 1.45 (H) 04/01/2021   CREATININE 1.42 (H) 01/25/2021   CREATININE 1.47 (H) 01/20/2021    Lab Results  Component Value Date   WBC 7.0 04/01/2021   HGB 12.2 04/01/2021   HCT 36.1 04/01/2021   PLT 196 04/01/2021   GLUCOSE 122 (H) 04/01/2021   CHOL 128 04/02/2021   TRIG 108 04/02/2021   HDL 48 04/02/2021   LDLDIRECT 72.0 11/09/2015   LDLCALC 58 04/02/2021   ALT 14 01/15/2021   AST 18 01/15/2021   NA 138 04/01/2021   K 4.7 04/01/2021   CL 101 04/01/2021   CREATININE 1.45 (H) 04/01/2021   BUN 23 04/01/2021   CO2 28 04/01/2021   TSH 2.301  04/01/2021   INR 0.9 01/31/2018   HGBA1C 5.8 01/30/2017   MICROALBUR 2.5 (H) 12/26/2011    ECHOCARDIOGRAM COMPLETE  Result Date: 04/02/2021    ECHOCARDIOGRAM REPORT   Patient Name:   [name] Date of Exam: 04/02/2021 Medical Rec #:  [id number]         Height:       63.0 in Accession #:    [id number]        Weight:       128.1 lb Date of Birth:  11-28-1925        BSA:          1.600 m Patient Age:    33 years  BP:           156/58 mmHg Patient Gender: F                 HR:           67 bpm. Exam Location:  [facility] Procedure: 2D Echo, Color Doppler and Cardiac Doppler Indications:     Dyspnea R06.00                  Chest pain R07.9  History:         Patient has prior history of Echocardiogram examinations, most                  recent 01/15/2020. COPD; Signs/Symptoms:Shortness of Breath and                  Syncope.  Sonographer:     Sherrie Sport RDCS (AE) Referring Phys:  [id number] AMY N COX Diagnosing Phys: Ida Rogue MD  Sonographer Comments: Suboptimal apical window. Image acquisition challenging due to patient body habitus. IMPRESSIONS  1. Left ventricular ejection fraction, by estimation, is 55 to 60%. The left ventricle has normal function. The left ventricle has no regional wall motion abnormalities. Left ventricular diastolic parameters are consistent with Grade I diastolic dysfunction (impaired relaxation).  2. Right ventricular systolic function is normal. The right ventricular size is normal.  3. Left atrial size was mildly dilated.  4. The mitral valve is normal in structure. Mild mitral valve regurgitation. FINDINGS  Left Ventricle: Left ventricular ejection fraction, by estimation, is 55 to 60%. The left ventricle has normal function. The left ventricle has no regional wall motion abnormalities. The left ventricular internal cavity size was normal in size. There is  no left ventricular hypertrophy. Left ventricular diastolic parameters are consistent with Grade I diastolic  dysfunction (impaired relaxation). Right Ventricle: The right ventricular size is normal. No increase in right ventricular wall thickness. Right ventricular systolic function is normal. Left Atrium: Left atrial size was mildly dilated. Right Atrium: Right atrial size was normal in size. Pericardium: There is no evidence of pericardial effusion. Mitral Valve: The mitral valve is normal in structure. Mild mitral valve regurgitation. No evidence of mitral valve stenosis. Tricuspid Valve: The tricuspid valve is normal in structure. Tricuspid valve regurgitation is mild . No evidence of tricuspid stenosis. Aortic Valve: The aortic valve is normal in structure. Aortic valve regurgitation is not visualized. Mild to moderate aortic valve sclerosis/calcification is present, without any evidence of aortic stenosis. Aortic valve mean gradient measures 3.0 mmHg. Aortic valve peak gradient measures 5.0 mmHg. Aortic valve area, by VTI measures 2.05 cm. Pulmonic Valve: The pulmonic valve was normal in structure. Pulmonic valve regurgitation is not visualized. No evidence of pulmonic stenosis. Aorta: The aortic root is normal in size and structure. Venous: The inferior vena cava is normal in size with greater than 50% respiratory variability, suggesting right atrial pressure of 3 mmHg. IAS/Shunts: No atrial level shunt detected by color flow Doppler.  LEFT VENTRICLE PLAX 2D LVIDd:         3.82 cm  Diastology LVIDs:         2.73 cm  LV e' medial:    4.79 cm/s LV PW:         1.42 cm  LV E/e' medial:  14.2 LV IVS:        0.89 cm  LV e' lateral:   7.29 cm/s LVOT diam:     2.00 cm  LV E/e'  lateral: 9.3 LV SV:         52 LV SV Index:   33 LVOT Area:     3.14 cm  RIGHT VENTRICLE RV Basal diam:  2.48 cm LEFT ATRIUM             Index       RIGHT ATRIUM           Index LA diam:        4.30 cm 2.69 cm/m  RA Area:     11.30 cm LA Vol (A2C):   52.5 ml 32.81 ml/m RA Volume:   24.00 ml  15.00 ml/m LA Vol (A4C):   61.0 ml 38.12 ml/m LA  Biplane Vol: 57.3 ml 35.81 ml/m  AORTIC VALVE                   PULMONIC VALVE AV Area (Vmax):    1.81 cm    PV Vmax:        0.75 m/s AV Area (Vmean):   1.59 cm    PV Peak grad:   2.2 mmHg AV Area (VTI):     2.05 cm    RVOT Peak grad: 3 mmHg AV Vmax:           112.00 cm/s AV Vmean:          83.000 cm/s AV VTI:            0.254 m AV Peak Grad:      5.0 mmHg AV Mean Grad:      3.0 mmHg LVOT Vmax:         64.60 cm/s LVOT Vmean:        42.100 cm/s LVOT VTI:          0.166 m LVOT/AV VTI ratio: 0.65  AORTA Ao Root diam: 2.70 cm MITRAL VALVE                TRICUSPID VALVE MV Area (PHT): 1.98 cm     TR Peak grad:   11.7 mmHg MV Decel Time: 383 msec     TR Vmax:        171.00 cm/s MV E velocity: 68.10 cm/s MV Allen velocity: 103.00 cm/s  SHUNTS MV E/Allen ratio:  0.66         Systemic VTI:  0.17 m                             Systemic Diam: 2.00 cm Ida Rogue MD Electronically signed by Ida Rogue MD Signature Date/Time: 04/02/2021/4:31:51 PM    Final     Assessment & Plan:   Problem List Items Addressed This Visit       Unprioritized   Atypical chest pain    Atypical  , occurring at rest and relieved with NTG   Denies dysphagia. No chagne  to regimen . I have ordered and reviewed Allen 12 lead EKG and find that there are no acute changes and patient is in sinus rhythm.         B12 deficiency    Giving IM injection today for low energy.  If she feels an improvement,  Will resume injections at drugstore.        CKD (chronic kidney disease) stage 3, GFR 30-59 ml/min (HCC)    Diagnosis and management reviewed with patient.  Nephrology referral declined       Essential (primary) hypertension    Secondary to dehydration and  over exertion.  Resolved with IV fluids .  Encouraged to start checking BP at home given recent episode of severe hypotension (systolic drop to 77)       Insomnia secondary to anxiety    Moving melatonin to dinnertime dosing and rx alprazolam 0.125 mg  qhs prn 2 am wakeups        Relevant Medications   ALPRAZolam (XANAX) 0.25 MG tablet   SOB (shortness of breath)    Likely related to pulmonary disease given normal EF by July 2022 ECHO       Sterile pyuria    Educated patient in the difference between pyuria and symptomatic UTI.  Given her history of c dificile , she is at increased risk of recurrence with any antibiotic use.  Fortunately she has not had diarrhea and is taking Allen probiotic        Other Visit Diagnoses     Chest pain, unspecified type    -  Primary   Relevant Orders   EKG 12-Lead (Completed)     Allen total of 40 minutes of face to face time was spent with patient more than half of which was spent in reviewing her symptoms of weakness and hypotension on the day of ER evaluation,  reviewing the work up counselling and coordination of care    I have changed [name]'s ALPRAZolam. I am also having her maintain her Ipratropium-Albuterol, esomeprazole, isosorbide mononitrate, nitroGLYCERIN, propranolol, Probiotic Product (PROBIOTIC PO), vitamin B-12, atorvastatin, ranolazine, losartan, clopidogrel, and Cranberry. We administered cyanocobalamin.  Meds ordered this encounter  Medications   ALPRAZolam (XANAX) 0.25 MG tablet    Sig: Take 1 tablet (0.25 mg total) by mouth at bedtime as needed for sleep or anxiety.    Dispense:  30 tablet    Refill:  2   cyanocobalamin ((VITAMIN B-12)) injection 1,000 mcg    Medications Discontinued During This Encounter  Medication Reason   ALPRAZolam (XANAX) 0.25 MG tablet Reorder    Follow-up: Return in about 3 months (around 07/07/2021) for hypertension,  insomnia  CKD.   [name], MD

## 2021-04-06 NOTE — Assessment & Plan Note (Signed)
Educated patient in the difference between pyuria and symptomatic UTI.  Given her history of c dificile , she is at increased risk of recurrence with any antibiotic use.  Fortunately she has not had diarrhea and is taking a probiotic

## 2021-04-06 NOTE — Assessment & Plan Note (Signed)
Moving melatonin to dinnertime dosing and rx alprazolam 0.125 mg  qhs prn 2 am wakeups

## 2021-04-06 NOTE — Assessment & Plan Note (Signed)
Secondary to dehydration and over exertion.  Resolved with IV fluids .  Encouraged to start checking BP at home given recent episode of severe hypotension (systolic drop to 77)

## 2021-04-06 NOTE — Assessment & Plan Note (Signed)
Diagnosis and management reviewed with patient.  Nephrology referral declined

## 2021-04-06 NOTE — Patient Instructions (Addendum)
Start checking your blood pressure at home,  not more than once daily.  Send readings to me   You have had all the vaccines you need,  unless you decide to get more COVID boosters..  For your insomnia:  Take the melatonin at dinner time.  Use the alprazolam at 2 AM if you wake up and can't go back to sleep   You did not have a UTI.  An abnormal urinalysis in the absence of urinary symptoms should NOT be treated as a UTI , especially in you

## 2021-04-06 NOTE — Assessment & Plan Note (Signed)
Likely related to pulmonary disease given normal EF by July 2022 ECHO

## 2021-04-06 NOTE — Assessment & Plan Note (Addendum)
Atypical  , occurring at rest and relieved with NTG   Denies dysphagia. No chagne  to regimen . I have ordered and reviewed a 12 lead EKG and find that there are no acute changes and patient is in sinus rhythm.

## 2021-04-08 ENCOUNTER — Ambulatory Visit: Payer: Medicare Other | Admitting: Physical Therapy

## 2021-04-08 ENCOUNTER — Ambulatory Visit: Payer: Medicare Other

## 2021-04-11 IMAGING — MG DIGITAL SCREENING BILAT W/ TOMO W/ CAD
6 of 10 series · 6 of 30 positions shown · non-contrast
Comparison: Previous exam(s).

CLINICAL DATA: Screening.

EXAM:
DIGITAL SCREENING BILATERAL MAMMOGRAM WITH TOMO AND CAD

[R CC synth-2D]
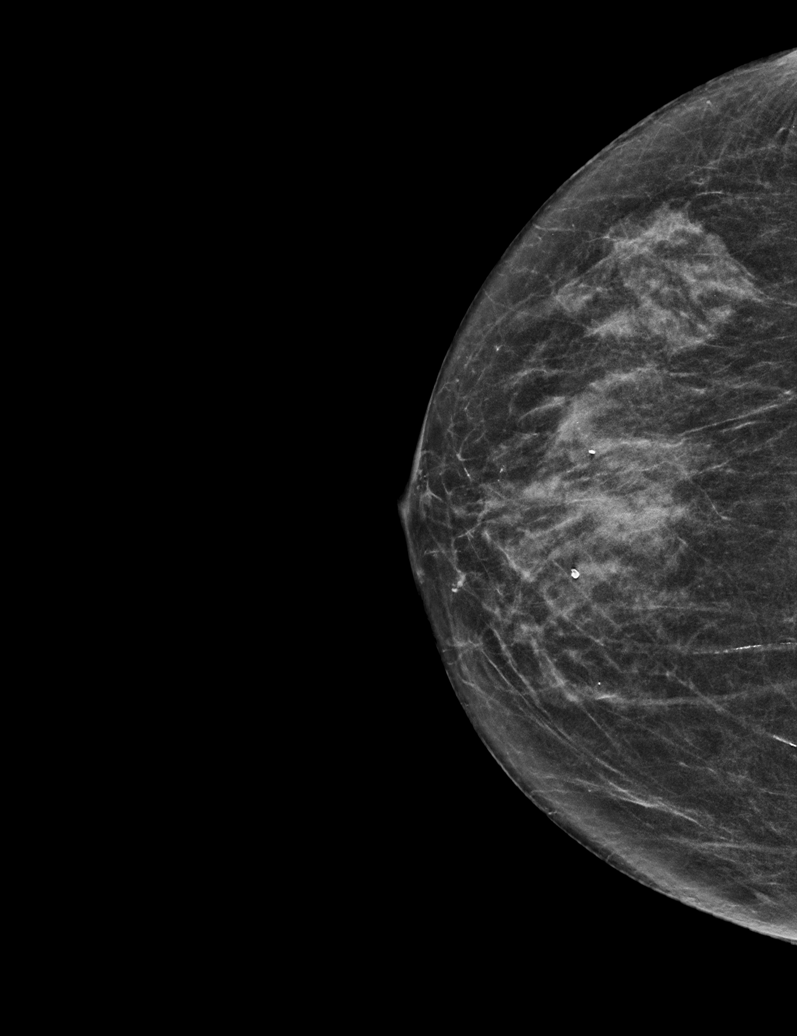

[L MLO synth-2D]
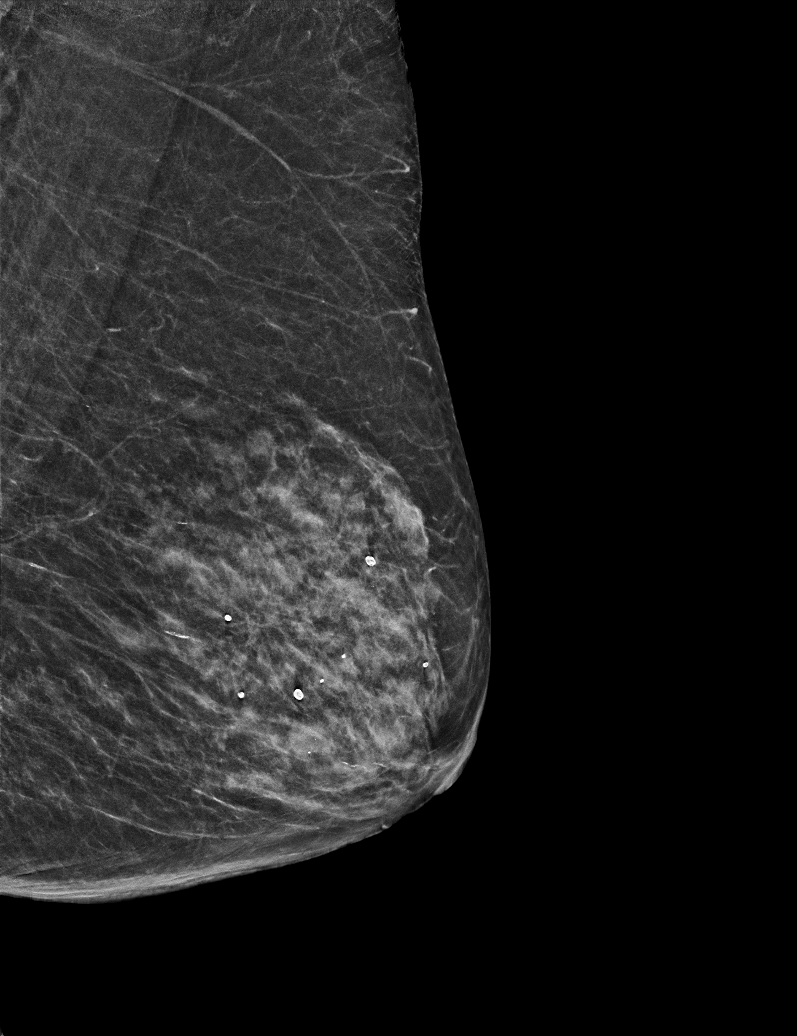

[L CC synth-2D]
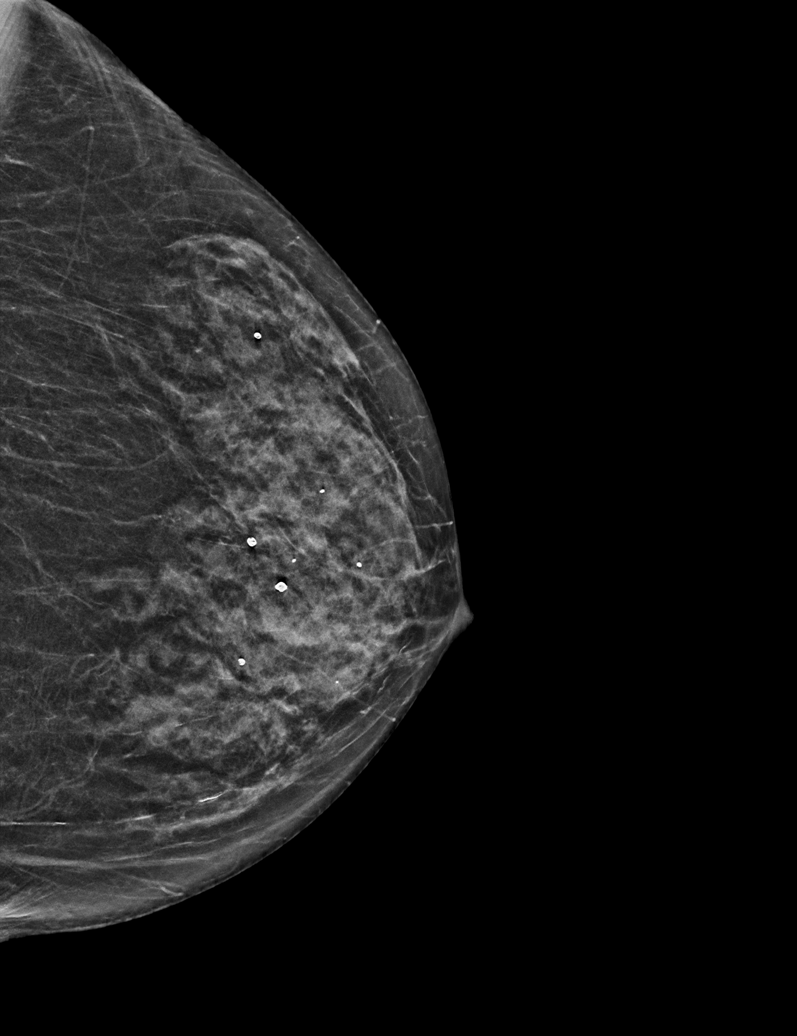

[R MLO synth-2D]
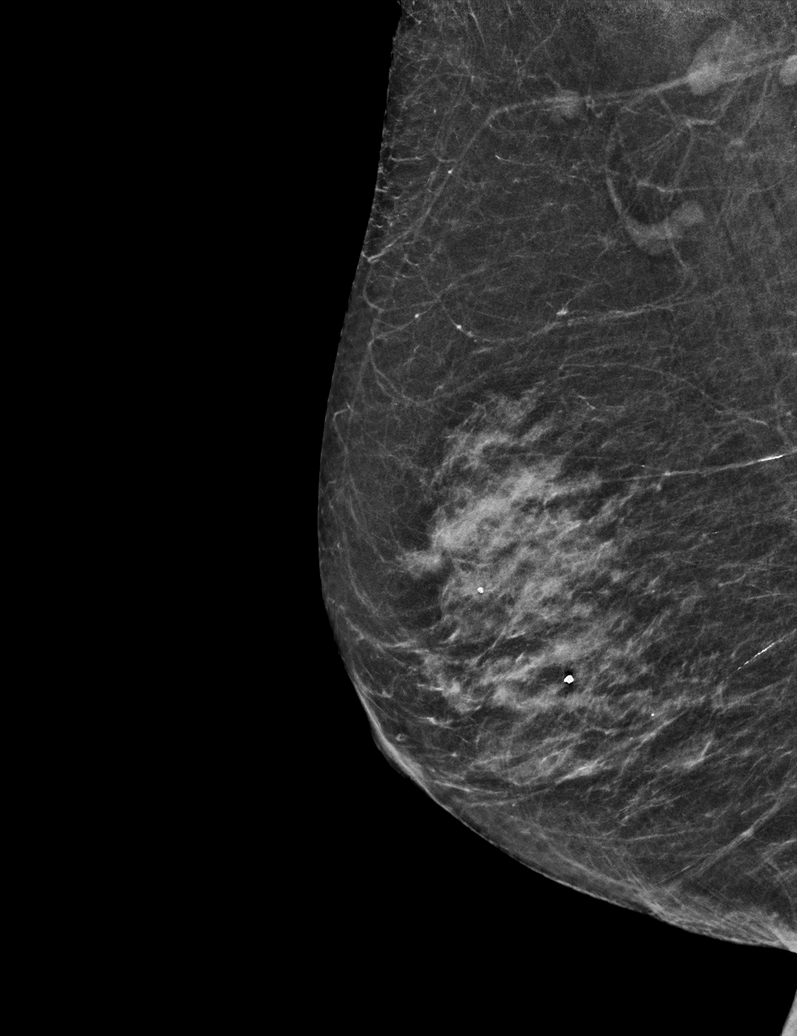

[L XCCL synth-2D]
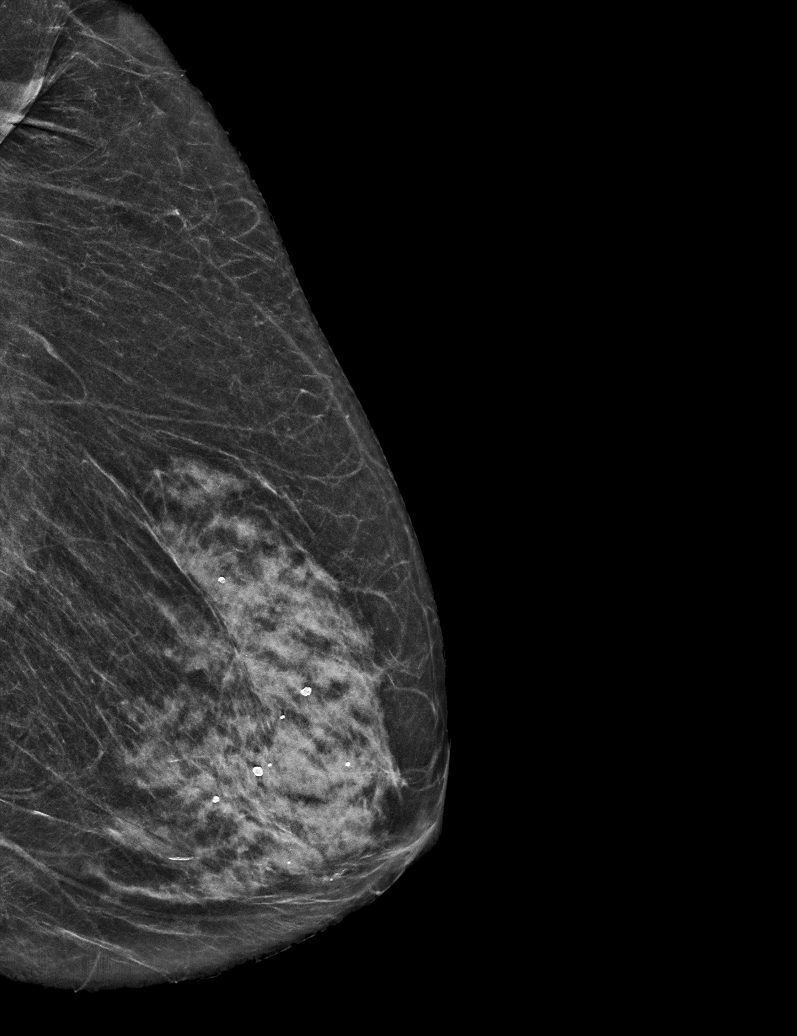

[L CC tomo · tomo slice 22/43.0]
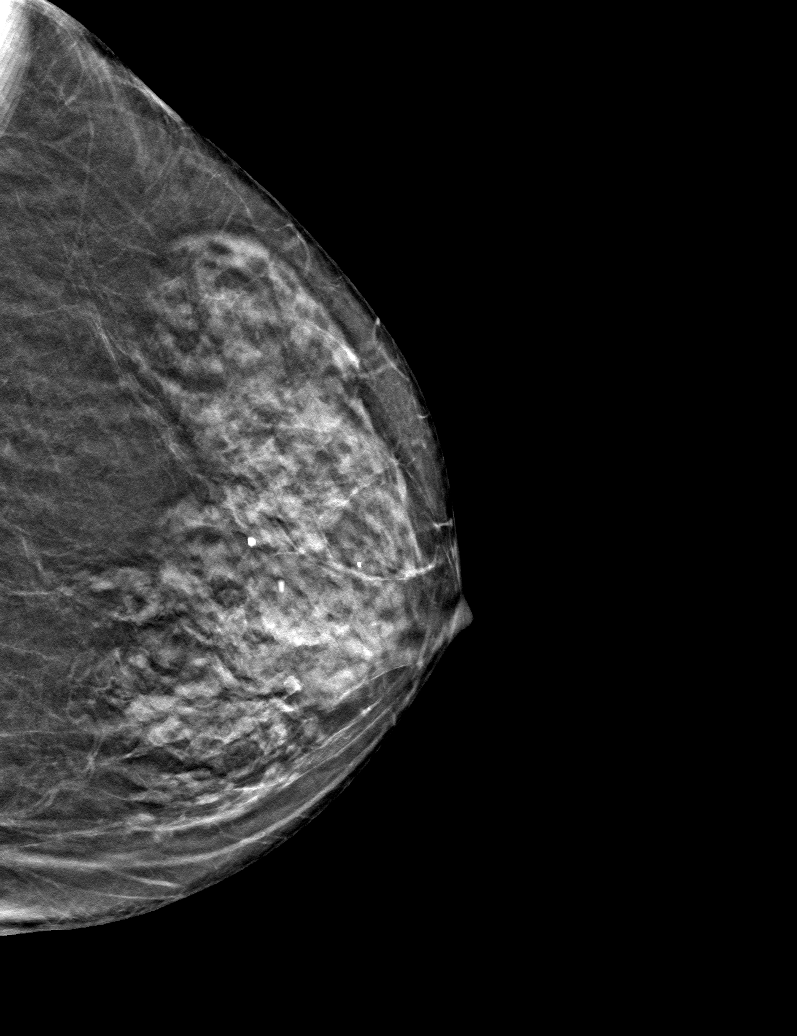

[6 of 30 positions shown; findings below may reference images not displayed]

ACR Breast Density Category c: The breast tissue is heterogeneously
dense, which may obscure small masses.
FINDINGS: There are no findings suspicious for malignancy. Images were
processed with CAD.
IMPRESSION: No mammographic evidence of malignancy. A result letter of this
screening mammogram will be mailed directly to the patient.

RECOMMENDATION:
Screening mammogram in one year. (Code:[id number])

BI-RADS CATEGORY  1: Negative.

## 2021-04-11 NOTE — Progress Notes (Signed)
Cardiology Office Note  Date:  04/12/2021   ID:  [name], DOB 1926-03-31, MRN [id number]  PCP:  [name], MD   Chief Complaint  Patient presents with   Other    6 month follow up. Patient c.o "chest fullness" and swelling in ankles. Meds reviewed verbally with patient.     HPI:  [name] is a pleasant 87 year old woman with a history of  CAD, bypass surgery in 1999, Stress test October 2017 with no ischemia COPD, Smoking for 40 years who quit in 1985,  chronic renal insufficiency,  hypertension,  hyperlipidemia,  40-50% bilateral carotid arterial disease  EF >60% in 05/2015  C. difficile in January 2015. H/o fall wet floor  02/2017, Suffered a pelvic fracture, left shoulder pain who presents for routine followup of her coronary artery disease  Leg swelling, B/l Off amlodipine Leg still swollen More sedentary Denies shortness of breath on exertion  Echocardiogram July 2022 normal LV function, no mention of elevated right heart pressures, no significant TR  BP elevated today: 130 at home 180 here, was rushing to get here  Confused when to take her propranolol Previously reported occasional palpitation Not a major issue recently  NTG SL once every 2 weeks for chest pain at night Feels it is stable Also some atypical left flank pain that improves with Biofreeze  EKG personally reviewed by myself on todays visit Normal sinus rhythm rate 66 bpm nonspecific ST and T wave abnormality  Other past medical history reviewed In follow-up today reports that she got Covid infection over Christmas holiday when everybody got together for Christmas dinner Everybody at the dinner got Covid  Prior stress test in 2020 and 2019 Prior stress test reviewed with her September 2020, low risk  Seen in the emergency room for dizziness and fatigue on December 30, 2019 Felt to be dehydrated, wheezing from allergies/COPD exacerbation Creatinine 1.48 Treated with low-dose  prednisone several days, inhaler  Seen October 21, 2019 for paroxysmal tachycardia Previously with reproducible left pectoral pain on palpation Chronic mild shortness of breath  Previously treated with chemotherapy for cancer on right lower extremity  Hospital admission 12/21/2016 for COPD exacerbation Syncope, dehydrated  PMH:   has a past medical history of C. difficile colitis, CAD (coronary artery disease), Carotid artery disease ([place]), Closed fracture pubis ([place]) (07/04/2017), COPD (chronic obstructive pulmonary disease) ([place]), Diffuse cystic mastopathy, Diverticulitis, Dizziness, Gait abnormality, GERD (gastroesophageal reflux disease), History of migraines, CABG, Hyperlipidemia, Hypertension, Indigestion, Kidney stones, Rheumatic fever, Sinus bradycardia, SOB (shortness of breath), and Syncopal episodes.  PSH:    Past Surgical History:  Procedure Laterality Date   APPENDECTOMY  1950   BREAST BIOPSY Right 1994   BREAST CYST ASPIRATION     multiple BIL   CATARACT EXTRACTION Right 2009   CATARACT EXTRACTION Left 2011   COLONOSCOPY  [id number]   Dr. Jamal Collin   CORONARY ARTERY BYPASS GRAFT  1999   esophagus stretched     [place]   due to metrorrhagia    Current Outpatient Medications  Medication Sig Dispense Refill   ALPRAZolam (XANAX) 0.25 MG tablet Take 1 tablet (0.25 mg total) by mouth at bedtime as needed for sleep or anxiety. 30 tablet 2   atorvastatin (LIPITOR) 40 MG tablet TAKE ONE TABLET BY MOUTH EVERY DAY 90 tablet 1   clopidogrel (PLAVIX) 75 MG tablet TAKE 1 TABLET BY MOUTH DAILY 90 tablet 0   Cranberry 125 MG  TABS Take 1 tablet by mouth daily.     esomeprazole (NEXIUM) 40 MG capsule Take 40 mg by mouth daily at 12 noon.     Ipratropium-Albuterol (COMBIVENT) 20-100 MCG/ACT AERS respimat Inhale 1 puff into the lungs every 6 (six) hours as needed for wheezing. 4 g 6   isosorbide mononitrate (IMDUR) 60 MG 24 hr tablet Take  1.5 tablets (90 mg total) by mouth 2 (two) times daily. 270 tablet 3   losartan (COZAAR) 100 MG tablet TAKE 1 TABLET BY MOUTH DAILY 90 tablet 0   Probiotic Product (PROBIOTIC PO) Take 1 capsule by mouth daily.     propranolol (INDERAL) 20 MG tablet TAKE ONE TABLET 3 TIMES DAILY AS NEEDED FOR TACHYCARDIA 270 tablet 3   ranolazine (RANEXA) 500 MG 12 hr tablet TAKE 1 TABLET BY MOUTH TWICE DAILY 180 tablet 0   vitamin B-12 (CYANOCOBALAMIN) 500 MCG tablet Take 500 mcg by mouth daily.     nitroGLYCERIN (NITROSTAT) 0.4 MG SL tablet PLACE 1 TABLET UNDER TONGUE EVERY 5 MIN AS NEEDED FOR CHEST PAIN IF NO RELIEF IN15 MIN CALL 911 (MAX 3 TABS) 25 tablet 3   No current facility-administered medications for this visit.     Allergies:   Ciprofloxacin   Social History:  The patient  reports that she quit smoking about 37 years ago. Her smoking use included cigarettes. She has a 20.00 pack-year smoking history. She has never used smokeless tobacco. She reports previous alcohol use. She reports that she does not use drugs.   Family History:   family history includes Heart disease in her father and mother.    Review of Systems: Review of Systems  Constitutional:  Positive for malaise/fatigue.  HENT: Negative.    Respiratory: Negative.    Cardiovascular: Negative.   Gastrointestinal: Negative.   Musculoskeletal: Negative.   Neurological: Negative.   Psychiatric/Behavioral: Negative.    All other systems reviewed and are negative.  PHYSICAL EXAM: VS:  BP (!) 160/70 (BP Location: Left Arm, Patient Position: Sitting, Cuff Size: Normal)   Pulse 66   Ht 5' 2.5" (1.588 m)   Wt 123 lb (55.8 kg)   SpO2 96%   BMI 22.14 kg/m  , BMI Body mass index is 22.14 kg/m. Constitutional:  oriented to person, place, and time. No distress.  HENT:  Head: Grossly normal Eyes:  no discharge. No scleral icterus.  Neck: No JVD, no carotid bruits  Cardiovascular: Regular rate and rhythm, no murmurs  appreciated Pulmonary/Chest: Clear to auscultation bilaterally, no wheezes or rails Abdominal: Soft.  no distension.  no tenderness.  Musculoskeletal: Normal range of motion Neurological:  normal muscle tone. Coordination normal. No atrophy Skin: Skin warm and dry Psychiatric: normal affect, pleasant   Recent Labs: 01/15/2021: ALT 14 04/01/2021: BUN 23; Creatinine, Ser 1.45; Hemoglobin 12.2; Platelets 196; Potassium 4.7; Sodium 138; TSH 2.301 04/02/2021: Magnesium 1.8    Lipid Panel Lab Results  Component Value Date   CHOL 128 04/02/2021   HDL 48 04/02/2021   LDLCALC 58 04/02/2021   TRIG 108 04/02/2021    Wt Readings from Last 3 Encounters:  04/12/21 123 lb (55.8 kg)  04/06/21 122 lb 9.6 oz (55.6 kg)  04/01/21 128 lb 1.4 oz (58.1 kg)     ASSESSMENT AND PLAN:  Essential hypertension -  Blood pressure elevated, was rushing, typically 130 at home No changes made  Coronary artery disease involving native coronary artery of native heart without angina pectoris -  Chronic stable angina Again using  nitro periodically for chest pain, recommend she call us if this escalates Would be high risk catheterization given her age and complexity of disease  Shortness of breath/weakness Stable, active, doing well for her age  History of bypass surgery Nitro as needed Previously declined catheterization and stress testing  Carotid stenosis Less than 39% bilaterally, significant plaque noted Cholesterol at goal  Hyperlipidemia Cholesterol is at goal on the current lipid regimen. No changes to the medications were made.  Leg swelling Likely venous insufficiency, echo normal, no indication of elevated right heart pressures Recommend she gingerly could try Lasix 20 mg up to 2 times a week Recommend leg elevation, compression hose   Total encounter time more than 25 minutes  Greater than 50% was spent in counseling and coordination of care with the patient    Orders Placed This  Encounter  Procedures   EKG 12-Lead     Signed, Esmond Plants, M.D., Ph.D. 04/12/2021  [place], [place]

## 2021-04-12 ENCOUNTER — Other Ambulatory Visit: Payer: Self-pay

## 2021-04-12 ENCOUNTER — Ambulatory Visit (INDEPENDENT_AMBULATORY_CARE_PROVIDER_SITE_OTHER): Payer: Medicare Other | Admitting: Cardiovascular Disease

## 2021-04-12 ENCOUNTER — Other Ambulatory Visit: Payer: Self-pay | Admitting: Cardiovascular Disease

## 2021-04-12 VITALS — BP 160/70 | HR 66 | Ht 62.5 in | Wt 123.0 lb

## 2021-04-12 DIAGNOSIS — Z951 Presence of aortocoronary bypass graft: Secondary | ICD-10-CM | POA: Diagnosis not present

## 2021-04-12 DIAGNOSIS — I700 Atherosclerosis of aorta: Secondary | ICD-10-CM | POA: Diagnosis not present

## 2021-04-12 DIAGNOSIS — I10 Essential (primary) hypertension: Secondary | ICD-10-CM

## 2021-04-12 DIAGNOSIS — I6523 Occlusion and stenosis of bilateral carotid arteries: Secondary | ICD-10-CM

## 2021-04-12 DIAGNOSIS — I25118 Atherosclerotic heart disease of native coronary artery with other forms of angina pectoris: Secondary | ICD-10-CM | POA: Diagnosis not present

## 2021-04-12 DIAGNOSIS — N183 Chronic kidney disease, stage 3 unspecified: Secondary | ICD-10-CM

## 2021-04-12 DIAGNOSIS — E785 Hyperlipidemia, unspecified: Secondary | ICD-10-CM

## 2021-04-12 MED ORDER — NITROGLYCERIN 0.4 MG SL SUBL: SUBLINGUAL_TABLET | SUBLINGUAL | 3 refills | Status: DC

## 2021-04-12 NOTE — Patient Instructions (Addendum)
Medication Instructions:   1) Please take lasix/furosemide 1 to 2 x a week for leg swelling as needed  2) Continue to wear compression hose  3) Nitroglycerin has been refilled  If you need a refill on your cardiac medications before your next appointment, please call your pharmacy.    Lab work: No new labs needed   If you have labs (blood work) drawn today and your tests are completely normal, you will receive your results only by: [place] (if you have MyChart) OR A paper copy in the mail If you have any lab test that is abnormal or we need to change your treatment, we will call you to review the results.   Testing/Procedures: No new testing needed   Follow-Up: At [facility], you and your health needs are our priority.  As part of our continuing mission to provide you with exceptional heart care, we have created designated Provider Care Teams.  These Care Teams include your primary Cardiologist (physician) and Advanced Practice Providers (APPs -  Physician Assistants and Nurse Practitioners) who all work together to provide you with the care you need, when you need it.  You will need a follow up appointment in 6 months  Providers on your designated Care Team:   Murray Hodgkins, NP Christell Faith, PA-C Marrianne Mood, PA-C Cadence Kathlen Mody, [place]  Any Other Special Instructions Will Be Listed Below (If Applicable).  COVID-19 Vaccine Information can be found at: [email or web address] For questions related to vaccine distribution or appointments, please email vaccine@ [email or web address] or call [phone].

## 2021-04-13 ENCOUNTER — Ambulatory Visit: Payer: Medicare Other | Admitting: Physical Therapy

## 2021-04-13 ENCOUNTER — Ambulatory Visit: Payer: Medicare Other

## 2021-04-13 DIAGNOSIS — H903 Sensorineural hearing loss, bilateral: Secondary | ICD-10-CM | POA: Diagnosis not present

## 2021-04-13 DIAGNOSIS — H60332 Swimmer's ear, left ear: Secondary | ICD-10-CM | POA: Diagnosis not present

## 2021-04-14 ENCOUNTER — Other Ambulatory Visit: Payer: Self-pay | Admitting: Internal Medicine

## 2021-04-14 DIAGNOSIS — Z1231 Encounter for screening mammogram for malignant neoplasm of breast: Secondary | ICD-10-CM

## 2021-04-15 ENCOUNTER — Telehealth: Payer: Self-pay | Admitting: Cardiovascular Disease

## 2021-04-15 ENCOUNTER — Ambulatory Visit: Payer: Medicare Other

## 2021-04-15 ENCOUNTER — Ambulatory Visit: Payer: Medicare Other | Admitting: Physical Therapy

## 2021-04-15 VITALS — BP 142/53 | HR 78

## 2021-04-15 DIAGNOSIS — R262 Difficulty in walking, not elsewhere classified: Secondary | ICD-10-CM | POA: Diagnosis not present

## 2021-04-15 DIAGNOSIS — R269 Unspecified abnormalities of gait and mobility: Secondary | ICD-10-CM | POA: Diagnosis not present

## 2021-04-15 DIAGNOSIS — R531 Weakness: Secondary | ICD-10-CM

## 2021-04-15 MED ORDER — FUROSEMIDE 20 MG PO TABS: ORAL_TABLET | ORAL | 1 refills | Status: DC

## 2021-04-15 NOTE — Telephone Encounter (Signed)
*  STAT* If patient is at the pharmacy, call can be transferred to refill team.   1. Which medications need to be refilled? (please list name of each medication and dose if known) lasix/ furosemide  2. Which pharmacy/location (including street and city if local pharmacy) is medication to be sent to? [facility]  3. Do they need a 30 day or 90 day supply? 63   Was not sent to pharmacy after last visit

## 2021-04-15 NOTE — Telephone Encounter (Signed)
Requested Prescriptions   Signed Prescriptions Disp Refills   furosemide (LASIX) 20 MG tablet 10 tablet 1    Sig: Take 1 tablet 1-2 days per week as needed    Authorizing Provider: Minna Merritts    Ordering User: Raelene Bott, Darrell Leonhardt L

## 2021-04-15 NOTE — Therapy (Signed)
[facility] [address] [place], [place], [address] Phone: [phone]   Fax:  [phone]  Physical Therapy Treatment  Patient Details  Name: Katrina LOSEE MRN: [id number] Date of Birth: 18-Sep-1926 Referring Provider (PT): Derrel Nip MD   Encounter Date: 04/15/2021   PT End of Session - 04/15/21 1456     Visit Number 8    Number of Visits 25    Date for PT Re-Evaluation 05/12/21    Authorization Time Period INITIAL EVAL = 02/17/2021    PT Start Time 1415    PT Stop Time 1500    PT Time Calculation (min) 45 min    Equipment Utilized During Treatment Gait belt    Activity Tolerance Patient tolerated treatment well    Behavior During Therapy [facility] for tasks assessed/performed             Past Medical History:  Diagnosis Date   C. difficile colitis    CAD (coronary artery disease)    a. s/p CABG 1999; b. Nuclear 10/11, no scar or ischemia, EF 68%; b. [place] 06/18/14: no evidence of infarction or ischemia, EF 77%, low risk study; c. 02/2019 MV: EF 56%, ? mild lat ischemia->low risk.   Carotid artery disease ([place])    a. Doppler January, 2012, 40-59% bilateral; b. 02/2017 U/S: 1-39% bilat ICA stenosis.   Closed fracture pubis ([place]) 07/04/2017   COPD (chronic obstructive pulmonary disease) (HCC)    Diffuse cystic mastopathy    Diverticulitis    last colonoscopy  incomplete Jan 2011   Dizziness    stable now (Oct 2011) patient tells me question of TIA, we will obtain records   Gait abnormality    Patient complains of "wobbly gait", December, 2013   GERD (gastroesophageal reflux disease)    History of migraines    Hx of CABG    a. 3 vessel in 1999   Hyperlipidemia    Hypertension    Indigestion    3 weeks, October 2011   Kidney stones    Rheumatic fever    Sinus bradycardia    Mild, December, 2013   SOB (shortness of breath)    Syncopal episodes    after 18 holes of golf and increase  hydrochlorothiazide, resolved     Past Surgical History:  Procedure Laterality Date   [place]     multiple BIL   CATARACT EXTRACTION Right 2009   CATARACT EXTRACTION Left 2011   COLONOSCOPY  0623,7628   Dr. Jamal Collin   CORONARY ARTERY BYPASS GRAFT  1999   esophagus stretched     [place]   due to metrorrhagia    Vitals:   04/15/21 1422  BP: (!) 142/53  Pulse: 78  SpO2: 98%     Subjective Assessment - 04/15/21 1418     Subjective Pt reports feeling better since last appointment, and she has been sleeping better.    Patient is accompained by: Family member   Daughter   Pertinent History Patient is a 87 year old female with referral from PCP for progressive weakness. Patient reports she is still mostly independent just moving around slower and not as active. She states she lives alone in a 2 story home without an AD for mobility. She reports her bedroom and bathroom are on 2nd level and that she has to negotiate steps  with some difficulty. Past medical history is significant for HTN, CAD, Hardening of Aorta, Sinus Bradycardia, Diastolic CHF, COPD, Hyperparathyroidism, Osteoporosis, CKD- Stage 3, Hyperlipidemia, LBP, COVID- Dec 2021.    Limitations Lifting;Standing;Walking;House hold activities    How long can you sit comfortably? NA    How long can you stand comfortably? maybe 5-10 min    How long can you walk comfortably? < 500 feet probably    Diagnostic tests NA    Patient Stated Goals I want to get my legs stronger               THEREX  Standing Marches 2 x 10 no BUE support   Standing Hip Abduction with 1 UE 2 x 10  -VC to maintain upright posture   Standing HS curls with 1 UE support 2 x 10   Standing Mini-Squats BUE support 1 x 10  -VC to keep knees behind toes   Standing Mini-Squats 1 x 10   Standing Heel Raises 2 x 10   Pt reports feeling out  of breath  -Educated on diaphragmatic breathing   Sit to stand 2 x 10  -VC to not use hands    Updated HEP and educated patient on changes to exercises and addition of new exercises      PT Education - 04/15/21 1421     Education Details form/technique for correct exercise    Person(s) Educated Patient    Methods Explanation;Demonstration;Verbal cues;Handout    Comprehension Verbalized understanding;Returned demonstration;Verbal cues required              PT Short Term Goals - 04/15/21 1456       PT SHORT TERM GOAL #1   Title Pt will be independent with HEP in order to improve strength and balance in order to decrease fall risk and improve function at home and work.    Baseline 02/17/2021: Patient reports she has not been performing any exercises in the home.    Time 6    Period Weeks    Status New    Target Date 03/31/21               PT Long Term Goals - 04/01/21 1458       PT LONG TERM GOAL #1   Title Patient will increase 10 meter walk test to >1.42m/s as to improve gait speed for better community ambulation and to reduce fall risk.    Baseline 02/17/2021= 0.7 m/s without an AD with mild unsteadiness    Time 12    Period Weeks    Status New      PT LONG TERM GOAL #2   Title Pt will improve BERG by at least 3 points in order to demonstrate clinically significant improvement in balance.    Baseline 02/17/2021= 49/56    Time 12    Period Weeks    Status New      PT LONG TERM GOAL #3   Title Pt will improve FOTO to target score of 56/100 to display perceived improvements in ability to complete ADL's.    Baseline 02/17/2021= 45/100    Time 12    Period Weeks    Status New      PT LONG TERM GOAL #4   Title Pt will decrease TUG to below 14 seconds/decrease in order to demonstrate decreased fall risk.    Baseline 02/17/2021= 17.35 sec without an AD    Time 12    Period Weeks    Status  New      PT LONG TERM GOAL #5   Title Pt will decrease 5TSTS by at least 3  seconds in order to demonstrate clinically significant improvement in LE strength.    Baseline 02/17/2021= 20.03 sec without UE support    Time 12    Period Weeks    Status New                   Plan - 04/15/21 1457     Clinical Impression Statement Pt presents for f/u for generalized weakness and balance impairment. Pt demonstrates progress with LE strength and balance with ability to finish complete set of hip strengthening balance exercises. Pt does show limitations with labored breathing and fatigue. She will continue to benefit from skilled PT to improve LE strength and balance to decrease her risk of falling and to return to playing recreational golf.    Personal Factors and Comorbidities Age;Comorbidity 3+    Comorbidities COPD/Diastolic CHF, HTN, high fall risk, former smoker,    Examination-Activity Limitations Carry;Lift;Locomotion Level;Squat;Stairs;Stand;Transfers    Examination-Participation Restrictions Church;Cleaning;Community Activity;Shop;Volunteer;Laundry;Yard Work    Stability/Clinical Decision Making Stable/Uncomplicated    Rehab Potential Good    PT Frequency 2x / week    PT Duration 12 weeks    PT Treatment/Interventions ADLs/Self Care Home Management;Cryotherapy;Moist Heat;Gait training;Stair training;Functional mobility training;Therapeutic activities;Therapeutic exercise;Balance training;Neuromuscular re-education;Patient/family education;Energy conservation;DME Instruction;Manual techniques;Passive range of motion    PT Next Visit Plan Progress standing hip strengthening exercises and static dynamic balance exercises    PT Home Exercise Plan Access Code: [email or web address]  URL: [email or web address]    Consulted and Agree with Plan of Care Patient            HEP includes:  Access Code: [email or web address] URL: [email or web address] Date: 04/15/2021 Prepared by: Bradly Chris  Exercises Standing March with Counter Support - 1 x daily - 3  x weekly - 2 sets - 10 reps - 2 hold Standing Hip Abduction with Counter Support - 1 x daily - 3 x weekly - 2 sets - 10 reps - 2 hold Mini Squat with Counter Support - 1 x daily - 3 x weekly - 2 sets - 10 reps - 2 hold Standing Knee Flexion with Counter Support - 1 x daily - 3 x weekly - 2 sets - 10 reps - 2 hold Heel rises with counter support - 1 x daily - 3 x weekly - 2 sets - 10 reps - 2 hold Standing Ankle Dorsiflexion with Table Support - 1 x daily - 3 x weekly - 2 sets - 10 reps - 2 hold Half Tandem Stance Balance with Eyes Closed - 1 x daily - 7 x weekly - 1 sets - 5 reps - 30 hold Standing Knee Flexion AROM with Chair Support - 1 x daily - 3 x weekly - 2 sets - 10 reps Sit to Stand - 1 x daily - 3 x weekly - 2 sets - 10 reps    Patient will benefit from skilled therapeutic intervention in order to improve the following deficits and impairments:  Decreased balance, Decreased mobility, Difficulty walking, Cardiopulmonary status limiting activity, Decreased activity tolerance, Decreased strength, Decreased endurance, Decreased coordination, Hypomobility  Visit Diagnosis: Generalized weakness  Abnormality of gait and mobility     Problem List Patient Active Problem List   Diagnosis Date Noted   Sterile pyuria 04/06/2021   Hypotension 04/01/2021   Hyperparathyroidism due to renal insufficiency (HCC) 02/02/2021   CKD (chronic kidney disease) stage 3,  GFR 30-59 ml/min (HCC) 01/15/2021   Grief 04/08/2020   History of esophageal stricture 04/08/2020   Leg swelling 03/09/2020   Atypical chest pain 01/10/2020   Anorexia 01/10/2020   B12 deficiency 01/09/2020   Generalized weakness 07/14/2019   Mild malnutrition ([place]) 45/85/9292   Diastolic CHF, chronic ([place]) 03/25/2019   Vitamin D deficiency 03/25/2019   Melena 02/03/2018   S/P excision of skin lesion, follow-up exam 12/26/2017   Impingement syndrome of left shoulder region 07/04/2017   Neck pain 07/04/2017   Allergic  rhinitis 12/31/2016   SCC (squamous cell carcinoma), face 08/01/2016   Osteoporosis of forearm 02/13/2016   Dizziness and giddiness 02/13/2016   Insomnia secondary to anxiety 07/12/2015   Anxiety disorder 07/12/2015   Essential (primary) hypertension 10/07/2014   Sinus bradycardia    Hardening of the aorta (main artery of the heart) ([place]) 06/26/2014   History of Clostridium difficile colitis 10/20/2013   Personal history of other diseases of the digestive system 10/20/2013   Low back pain 10/04/2013   Encounter for general adult medical examination without abnormal findings 02/07/2013   Chest pain at rest 12/26/2011   COPD, moderate ([place]) 12/05/2011   Presence of aortocoronary bypass graft    Stage 3b chronic kidney disease ([place]) 09/25/2011   Hyperlipidemia    Hypertension    SOB (shortness of breath)    Coronary artery disease of native artery of native heart with stable angina pectoris ([place])    Carotid artery disease ([place])    Bradly Chris PT, DPT  04/15/2021, 3:00 PM  [facility] [address] [place], [address] Phone: [phone]   Fax:  [phone]  Name: LAMEES GABLE MRN: [id number] Date of Birth: 07/15/1926

## 2021-04-16 ENCOUNTER — Ambulatory Visit: Payer: Medicare Other | Admitting: *Deleted

## 2021-04-16 DIAGNOSIS — I10 Essential (primary) hypertension: Secondary | ICD-10-CM

## 2021-04-16 DIAGNOSIS — J449 Chronic obstructive pulmonary disease, unspecified: Secondary | ICD-10-CM

## 2021-04-16 NOTE — Chronic Care Management (AMB) (Signed)
  Care Management   Follow Up Note   04/16/2021 Name: [name] MRN: [id number] DOB: 12-12-1925   Referred by: [name], MD Reason for referral : Chronic Care Management (COPD, HTN)   Successful telephone outreach to patient for follow up.  Patient out at function and request call back another day and time.  Follow Up Plan: The care management team will reach out to the patient again over the next 30 business days per patient request.   Hubert Azure RN, MSN RN Care Management Coordinator [place] [phone] [email or web address]

## 2021-04-19 ENCOUNTER — Ambulatory Visit: Payer: Medicare Other | Admitting: *Deleted

## 2021-04-20 ENCOUNTER — Ambulatory Visit: Payer: Medicare Other | Admitting: Physical Therapy

## 2021-04-20 ENCOUNTER — Ambulatory Visit: Payer: Medicare Other

## 2021-04-20 ENCOUNTER — Telehealth: Payer: Self-pay | Admitting: Physical Therapy

## 2021-04-20 NOTE — Telephone Encounter (Signed)
Called pt because of her absence from apt. She did not pickup and VM instructing pt to call back to reschedule.

## 2021-04-22 ENCOUNTER — Other Ambulatory Visit: Payer: Self-pay

## 2021-04-22 ENCOUNTER — Ambulatory Visit: Payer: Medicare Other | Attending: Internal Medicine | Admitting: Physical Therapy

## 2021-04-22 ENCOUNTER — Ambulatory Visit: Payer: Medicare Other

## 2021-04-22 DIAGNOSIS — R531 Weakness: Secondary | ICD-10-CM | POA: Insufficient documentation

## 2021-04-22 DIAGNOSIS — R269 Unspecified abnormalities of gait and mobility: Secondary | ICD-10-CM | POA: Insufficient documentation

## 2021-04-22 DIAGNOSIS — R262 Difficulty in walking, not elsewhere classified: Secondary | ICD-10-CM | POA: Insufficient documentation

## 2021-04-22 NOTE — Therapy (Signed)
[facility] [address] [place], [place], [address] Phone: [phone]   Fax:  [phone]  Physical Therapy Treatment  Patient Details  Name: [name] MRN: [id number] Date of Birth: [birth year] Referring Provider (PT): Derrel Nip MD   Encounter Date: 04/22/2021   PT End of Session - 04/22/21 1508     Visit Number 9    Number of Visits 25    Date for PT Re-Evaluation 05/12/21    Authorization Time Period INITIAL EVAL = 02/17/2021    PT Start Time 1500    PT Stop Time 1545    PT Time Calculation (min) 45 min    Equipment Utilized During Treatment Gait belt    Activity Tolerance Patient tolerated treatment well    Behavior During Therapy [facility] for tasks assessed/performed             Past Medical History:  Diagnosis Date   C. difficile colitis    CAD (coronary artery disease)    a. s/p CABG 1999; b. Nuclear 10/11, no scar or ischemia, EF 68%; b. [place] 06/18/14: no evidence of infarction or ischemia, EF 77%, low risk study; c. 02/2019 MV: EF 56%, ? mild lat ischemia->low risk.   Carotid artery disease ([place])    a. Doppler January, 2012, 40-59% bilateral; b. 02/2017 U/S: 1-39% bilat ICA stenosis.   Closed fracture pubis ([place]) 07/04/2017   COPD (chronic obstructive pulmonary disease) (HCC)    Diffuse cystic mastopathy    Diverticulitis    last colonoscopy  incomplete Jan 2011   Dizziness    stable now (Oct 2011) patient tells me question of TIA, we will obtain records   Gait abnormality    Patient complains of "wobbly gait", December, 2013   GERD (gastroesophageal reflux disease)    History of migraines    Hx of CABG    a. 3 vessel in 1999   Hyperlipidemia    Hypertension    Indigestion    3 weeks, October 2011   Kidney stones    Rheumatic fever    Sinus bradycardia    Mild, December, 2013   SOB (shortness of breath)    Syncopal episodes    after 18 holes of golf and increase  hydrochlorothiazide, resolved     Past Surgical History:  Procedure Laterality Date   [place]     multiple BIL   CATARACT EXTRACTION Right 2009   CATARACT EXTRACTION Left 2011   COLONOSCOPY  8315,1761   Dr. Jamal Collin   CORONARY ARTERY BYPASS GRAFT  1999   esophagus stretched     [place]   due to metrorrhagia    BP 157/56 HR 79    Subjective Assessment - 04/22/21 1507     Subjective Pt reports having company this week and not having as much time to do her exercises.    Patient is accompained by: Family member   Daughter   Pertinent History Patient is a 87 year old female with referral from PCP for progressive weakness. Patient reports she is still mostly independent just moving around slower and not as active. She states she lives alone in a 2 story home without an AD for mobility. She reports her bedroom and bathroom are on 2nd level and that she has to negotiate steps with some difficulty. Past medical history is significant for  HTN, CAD, Hardening of Aorta, Sinus Bradycardia, Diastolic CHF, COPD, Hyperparathyroidism, Osteoporosis, CKD- Stage 3, Hyperlipidemia, LBP, COVID- Dec 2021.    Limitations Lifting;Standing;Walking;House hold activities    How long can you sit comfortably? NA    How long can you stand comfortably? maybe 5-10 min    How long can you walk comfortably? < 500 feet probably    Diagnostic tests NA    Patient Stated Goals I want to get my legs stronger    Currently in Pain? No/denies            NMR  Semi-tandem with horizontal head turns 2 x 10  Semi-tandem with vertical head turns 2 x 10  Semi-tandem with eyes closed 2 x 60 sec   THEREX   Sit to Stand 1 x 10   Sit to Stand with #5 DB 2 x 10   Standing Hip Abduction with 1 UE support 2 x 10  -VC to maintain upright posture     Updated HEP and educated patient on changes to exercises  and addition of new exercises      PT Education - 04/22/21 1508     Education Details form/technique for correct exercise    Person(s) Educated Patient    Methods Explanation;Demonstration    Comprehension Verbalized understanding;Verbal cues required;Returned demonstration              PT Short Term Goals - 04/22/21 1630       PT SHORT TERM GOAL #1   Title Pt will be independent with HEP in order to improve strength and balance in order to decrease fall risk and improve function at home and work.    Baseline 02/17/2021: Patient reports she has not been performing any exercises in the home.    Time 6    Period Weeks    Status New    Target Date 03/31/21               PT Long Term Goals - 04/22/21 1631       PT LONG TERM GOAL #1   Title Patient will increase 10 meter walk test to >1.2m/s as to improve gait speed for better community ambulation and to reduce fall risk.    Baseline 02/17/2021= 0.7 m/s without an AD with mild unsteadiness    Time 12    Period Weeks    Status New      PT LONG TERM GOAL #2   Title Pt will improve BERG by at least 3 points in order to demonstrate clinically significant improvement in balance.    Baseline 02/17/2021= 49/56    Time 12    Period Weeks    Status New      PT LONG TERM GOAL #3   Title Pt will improve FOTO to target score of 56/100 to display perceived improvements in ability to complete ADL's.    Baseline 02/17/2021= 45/100    Time 12    Period Weeks    Status New      PT LONG TERM GOAL #4   Title Pt will decrease TUG to below 14 seconds/decrease in order to demonstrate decreased fall risk.    Baseline 02/17/2021= 17.35 sec without an AD    Time 12    Period Weeks    Status New      PT LONG TERM GOAL #5   Title Pt will decrease 5TSTS by at least 3 seconds in order to demonstrate clinically significant improvement in LE strength.  Baseline 02/17/2021= 20.03 sec without UE support    Time 12    Period Weeks    Status New                    Plan - 04/22/21 1509     Clinical Impression Statement Pt presents for f/u generalized weakness and balance impairment. She exhibits improved strength with ability to complete increased number of sit to stands reps and increased resistance with #5 DB. She demonstrates improved static balance with ability to complete semi-tandem with head turns and eyes closed. Pt will continue to benefit from skilled PT to progress LE strength and static and dynamic balance to decrease her risk of falling.    Personal Factors and Comorbidities Age;Comorbidity 3+    Comorbidities COPD/Diastolic CHF, HTN, high fall risk, former smoker,    Examination-Activity Limitations Carry;Lift;Locomotion Level;Squat;Stairs;Stand;Transfers    Examination-Participation Restrictions Church;Cleaning;Community Activity;Shop;Volunteer;Laundry;Yard Work    Stability/Clinical Decision Making Stable/Uncomplicated    Rehab Potential Good    PT Frequency 2x / week    PT Duration 12 weeks    PT Treatment/Interventions ADLs/Self Care Home Management;Cryotherapy;Moist Heat;Gait training;Stair training;Functional mobility training;Therapeutic activities;Therapeutic exercise;Balance training;Neuromuscular re-education;Patient/family education;Energy conservation;DME Instruction;Manual techniques;Passive range of motion    PT Next Visit Plan Reassess goals. Dynamic balance exercises. Progress standing hip exercises and static balance exercises.    PT Home Exercise Plan Access Code: [email or web address]  URL: [email or web address]    Consulted and Agree with Plan of Care Patient             Patient will benefit from skilled therapeutic intervention in order to improve the following deficits and impairments:  Decreased balance, Decreased mobility, Difficulty walking, Cardiopulmonary status limiting activity, Decreased activity tolerance, Decreased strength, Decreased endurance, Decreased coordination,  Hypomobility  Visit Diagnosis: Generalized weakness  Abnormality of gait and mobility  Difficulty in walking, not elsewhere classified     Problem List Patient Active Problem List   Diagnosis Date Noted   Sterile pyuria 04/06/2021   Hypotension 04/01/2021   Hyperparathyroidism due to renal insufficiency ([place]) 02/02/2021   CKD (chronic kidney disease) stage 3, GFR 30-59 ml/min (HCC) 01/15/2021   Grief 04/08/2020   History of esophageal stricture 04/08/2020   Leg swelling 03/09/2020   Atypical chest pain 01/10/2020   Anorexia 01/10/2020   B12 deficiency 01/09/2020   Generalized weakness 07/14/2019   Mild malnutrition ([place]) 57/32/2025   Diastolic CHF, chronic ([place]) 03/25/2019   Vitamin D deficiency 03/25/2019   Melena 02/03/2018   S/P excision of skin lesion, follow-up exam 12/26/2017   Impingement syndrome of left shoulder region 07/04/2017   Neck pain 07/04/2017   Allergic rhinitis 12/31/2016   SCC (squamous cell carcinoma), face 08/01/2016   Osteoporosis of forearm 02/13/2016   Dizziness and giddiness 02/13/2016   Insomnia secondary to anxiety 07/12/2015   Anxiety disorder 07/12/2015   Essential (primary) hypertension 10/07/2014   Sinus bradycardia    Hardening of the aorta (main artery of the heart) ([place]) 06/26/2014   History of Clostridium difficile colitis 10/20/2013   Personal history of other diseases of the digestive system 10/20/2013   Low back pain 10/04/2013   Encounter for general adult medical examination without abnormal findings 02/07/2013   Chest pain at rest 12/26/2011   COPD, moderate ([place]) 12/05/2011   Presence of aortocoronary bypass graft    Stage 3b chronic kidney disease ([place]) 09/25/2011   Hyperlipidemia    Hypertension    SOB (shortness of breath)    Coronary artery disease  of native artery of native heart with stable angina pectoris ([place])    Carotid artery disease ([place])    Bradly Chris PT, DPT  04/22/2021, 5:00 PM  [facility] [address] [place], [address] Phone: [phone]   Fax:  [phone]  Name: [name] MRN: [id number] Date of Birth: 1926/05/19

## 2021-04-27 ENCOUNTER — Ambulatory Visit: Payer: Medicare Other | Admitting: Physical Therapy

## 2021-04-27 DIAGNOSIS — R531 Weakness: Secondary | ICD-10-CM

## 2021-04-27 DIAGNOSIS — R269 Unspecified abnormalities of gait and mobility: Secondary | ICD-10-CM

## 2021-04-27 DIAGNOSIS — R262 Difficulty in walking, not elsewhere classified: Secondary | ICD-10-CM

## 2021-04-27 NOTE — Therapy (Signed)
[facility] [address], [place], [address] Phone: [phone]   Fax:  [phone]  Physical Therapy Progress Note  Reporting Period: 02/17/21-04/27/21  Patient Details  Name: [name] MRN: [id number] Date of Birth: May 03, 1926 Referring Provider (PT): Derrel Nip MD   Encounter Date: 04/27/2021    Past Medical History:  Diagnosis Date   C. difficile colitis    CAD (coronary artery disease)    a. s/p CABG 1999; b. [place] 10/11, no scar or ischemia, EF 68%; b. [place] 06/18/14: no evidence of infarction or ischemia, EF 77%, low risk study; c. 02/2019 MV: EF 56%, ? mild lat ischemia->low risk.   Carotid artery disease ([place])    a. Doppler January, 2012, 40-59% bilateral; b. 02/2017 U/S: 1-39% bilat ICA stenosis.   Closed fracture pubis ([place]) 07/04/2017   COPD (chronic obstructive pulmonary disease) (HCC)    Diffuse cystic mastopathy    Diverticulitis    last colonoscopy  incomplete Jan 2011   Dizziness    stable now (Oct 2011) patient tells me question of TIA, we will obtain records   Gait abnormality    Patient complains of "wobbly gait", December, 2013   GERD (gastroesophageal reflux disease)    History of migraines    Hx of CABG    a. 3 vessel in 1999   Hyperlipidemia    Hypertension    Indigestion    3 weeks, October 2011   Kidney stones    Rheumatic fever    Sinus bradycardia    Mild, December, 2013   SOB (shortness of breath)    Syncopal episodes    after 18 holes of golf and increase hydrochlorothiazide, resolved     Past Surgical History:  Procedure Laterality Date   [place]     multiple BIL   CATARACT EXTRACTION Right 2009   CATARACT EXTRACTION Left 2011   COLONOSCOPY  8527,7824   Dr. Jamal Collin   CORONARY ARTERY BYPASS GRAFT  1999   esophagus stretched     [place]    due to metrorrhagia    There were no vitals filed for this visit.   Subjective Assessment - 04/27/21 1508     Subjective Pt reports still not being able to hear despite having hearing aids.    Patient is accompained by: Family member   Daughter   Pertinent History Patient is a 87 year old female with referral from PCP for progressive weakness. Patient reports she is still mostly independent just moving around slower and not as active. She states she lives alone in a 2 story home without an AD for mobility. She reports her bedroom and bathroom are on 2nd level and that she has to negotiate steps with some difficulty. Past medical history is significant for HTN, CAD, Hardening of Aorta, Sinus Bradycardia, Diastolic CHF, COPD, Hyperparathyroidism, Osteoporosis, CKD- Stage 3, Hyperlipidemia, LBP, COVID- Dec 2021.    Limitations Lifting;Standing;Walking;House hold activities    How long can you sit comfortably? NA    How long can you stand comfortably? maybe 5-10 min    How long can you walk comfortably? < 500 feet probably    Diagnostic tests NA    Patient Stated Goals I want to get my legs stronger    Currently in Pain? No/denies  [facility] PT Assessment - 04/27/21 0001       Berg Balance Test   Sit to Stand Able to stand without using hands and stabilize independently    Standing Unsupported Able to stand safely 2 minutes    Sitting with Back Unsupported but Feet Supported on Floor or Stool Able to sit safely and securely 2 minutes    Stand to Sit Sits safely with minimal use of hands    Transfers Able to transfer safely, minor use of hands    Standing Unsupported with Eyes Closed Able to stand 10 seconds safely    Standing Unsupported with Feet Together Able to place feet together independently and stand 1 minute safely    From Standing, Reach Forward with Outstretched Arm Can reach forward >12 cm safely (5")    From Standing Position, Pick up Object from Floor Able  to pick up shoe safely and easily    From Standing Position, Turn to Look Behind Over each Shoulder Looks behind one side only/other side shows less weight shift    Turn 360 Degrees Able to turn 360 degrees safely but slowly    Standing Unsupported, Alternately Place Feet on Step/Stool Able to stand independently and complete 8 steps >20 seconds    Standing Unsupported, One Foot in Front Needs help to step but can hold 15 seconds    Standing on One Leg Tries to lift leg/unable to hold 3 seconds but remains standing independently    Total Score 45    Berg comment: Graded by different PT            PHYSICAL PERFORMANCE   5 Times Sit to Stand: 14.30 sec  (Individuals with times that exceed the listed time have worse than average performance - predictive of recurrent falls in healthy community-living subjects)         - 74-69 y.o. 11.4 sec         - 70-79 y.o. 12.6 sec         - 80-89 y.o. 14.8 sec  TUG: 14.8 sec  >14 sec= increased risk for falls   10 Meter Walk Test   Gait Speed:    1st trial 13 sec , 2nd trial 13, Avg=  13 sec - Normal Gait speed Avg=   0.77 m/sec   <1 m/sec Need Intervention to reduce falls risk  0.12 m/sec improvement represents a statistically significant improvement in gait speed    BERG: 45/56 represents a low falls risk        PT Education - 04/27/21 1509     Education Details form/technique for correct exercise    Person(s) Educated Patient    Methods Explanation;Demonstration;Verbal cues    Comprehension Verbalized understanding;Returned demonstration;Verbal cues required              PT Short Term Goals - 04/27/21 1540       PT SHORT TERM GOAL #1   Title Pt will be independent with HEP in order to improve strength and balance in order to decrease fall risk and improve function at home and work.    Baseline 02/17/2021: Patient reports she has not been performing any exercises in the home.    Time 6    Period Weeks    Status New     Target Date 03/31/21               PT Long Term Goals - 04/27/21 1510       PT LONG TERM GOAL #  1   Title Patient will increase 10 meter walk test to >1.39m/s as to improve gait speed for better community ambulation and to reduce fall risk.    Baseline 02/17/2021= 0.7 m/s without an AD with mild unsteadiness 04/27/21: 0.77 m/sec    Time 12    Period Weeks    Status New      PT LONG TERM GOAL #2   Title Pt will improve BERG by at least 3 points in order to demonstrate clinically significant improvement in balance.    Baseline 02/17/2021= 49/56 04/2021: 49/56    Time 12    Period Weeks    Status New      PT LONG TERM GOAL #3   Title Pt will improve FOTO to target score of 56/100 to display perceived improvements in ability to complete ADL's.    Baseline 02/17/2021= 45/100 8/9: 73/56    Time 12    Period Weeks    Status Achieved      PT LONG TERM GOAL #4   Title Pt will decrease TUG to below 14 seconds/decrease in order to demonstrate decreased fall risk.    Baseline 02/17/2021= 17.35 sec without an AD 8/9: 14.90 sec    Time 12    Period Weeks    Status On-going    Target Date 05/12/21      PT LONG TERM GOAL #5   Title Pt will decrease 5TSTS by at least 3 seconds in order to demonstrate clinically significant improvement in LE strength.    Baseline 02/17/2021= 20.03 sec without UE support 8/9/022= 14.30 sec    Time 12    Period Weeks    Status Achieved                   Plan - 04/27/21 1640     Clinical Impression Statement Pt presents for f/u with re-evaluation for progress not for generalized weakness and balance impairment. She demonstrates statisically significant improvement in gait speed and an improvement in TUG score and LE strength demonstrating a reduction in her falls risk. She did exhibit a decrease in her BERG score compared to her initial score that places her at a low falls risk but this could be because each BERG was conducted by a different PT. She will  continue to benefit from skilled PT to progress static balance especially single leg tasks and to improve gait speed and LE strength to continue to decrease her risk for falling and to safely ambulate community distances.    Personal Factors and Comorbidities Age;Comorbidity 3+    Comorbidities COPD/Diastolic CHF, HTN, high fall risk, former smoker,    Examination-Activity Limitations Carry;Lift;Locomotion Level;Squat;Stairs;Stand;Transfers    Examination-Participation Restrictions Church;Cleaning;Community Activity;Shop;Volunteer;Laundry;Yard Work    Stability/Clinical Decision Making Stable/Uncomplicated    Rehab Potential Good    PT Frequency 2x / week    PT Duration 12 weeks    PT Treatment/Interventions ADLs/Self Care Home Management;Cryotherapy;Moist Heat;Gait training;Stair training;Functional mobility training;Therapeutic activities;Therapeutic exercise;Balance training;Neuromuscular re-education;Patient/family education;Energy conservation;DME Instruction;Manual techniques;Passive range of motion    PT Next Visit Plan Static Balance activities. Progress hip strengthening activities    PT Home Exercise Plan Access Code: [email or web address]  URL: [email or web address]    Consulted and Agree with Plan of Care Patient            HEP includes following exercises:  Access Code: [email or web address] URL: [email or web address] Date: 04/27/2021 Prepared by: Bradly Chris  Exercises Standing March with Counter Support - 1 x daily - 3 x weekly -  2 sets - 10 reps - 2 hold Standing Hip Abduction with Counter Support - 1 x daily - 3 x weekly - 2 sets - 10 reps - 2 hold Mini Squat with Counter Support - 1 x daily - 3 x weekly - 2 sets - 10 reps - 2 hold Standing Knee Flexion with Counter Support - 1 x daily - 3 x weekly - 2 sets - 10 reps - 2 hold Heel rises with counter support - 1 x daily - 3 x weekly - 2 sets - 10 reps - 2 hold Standing Ankle Dorsiflexion with Table Support - 1 x  daily - 3 x weekly - 2 sets - 10 reps - 2 hold Half Tandem Stance Balance with Eyes Closed - 1 x daily - 7 x weekly - 1 sets - 5 reps - 30 hold Standing Knee Flexion AROM with Chair Support - 1 x daily - 3 x weekly - 2 sets - 10 reps Sit to Stand - 1 x daily - 3 x weekly - 2 sets - 10 reps   Patient will benefit from skilled therapeutic intervention in order to improve the following deficits and impairments:  Decreased balance, Decreased mobility, Difficulty walking, Cardiopulmonary status limiting activity, Decreased activity tolerance, Decreased strength, Decreased endurance, Decreased coordination, Hypomobility  Visit Diagnosis: Generalized weakness  Abnormality of gait and mobility  Difficulty in walking, not elsewhere classified     Problem List Patient Active Problem List   Diagnosis Date Noted   Sterile pyuria 04/06/2021   Hypotension 04/01/2021   Hyperparathyroidism due to renal insufficiency (HCC) 02/02/2021   CKD (chronic kidney disease) stage 3, GFR 30-59 ml/min (HCC) 01/15/2021   Grief 04/08/2020   History of esophageal stricture 04/08/2020   Leg swelling 03/09/2020   Atypical chest pain 01/10/2020   Anorexia 01/10/2020   B12 deficiency 01/09/2020   Generalized weakness 07/14/2019   Mild malnutrition (HCC) 08/65/7846   Diastolic CHF, chronic (HCC) 03/25/2019   Vitamin D deficiency 03/25/2019   Melena 02/03/2018   S/P excision of skin lesion, follow-up exam 12/26/2017   Impingement syndrome of left shoulder region 07/04/2017   Neck pain 07/04/2017   Allergic rhinitis 12/31/2016   SCC (squamous cell carcinoma), face 08/01/2016   Osteoporosis of forearm 02/13/2016   Dizziness and giddiness 02/13/2016   Insomnia secondary to anxiety 07/12/2015   Anxiety disorder 07/12/2015   Essential (primary) hypertension 10/07/2014   Sinus bradycardia    Hardening of the aorta (main artery of the heart) ([place]) 06/26/2014   History of Clostridium difficile colitis 10/20/2013    Personal history of other diseases of the digestive system 10/20/2013   Low back pain 10/04/2013   Encounter for general adult medical examination without abnormal findings 02/07/2013   Chest pain at rest 12/26/2011   COPD, moderate ([place]) 12/05/2011   Presence of aortocoronary bypass graft    Stage 3b chronic kidney disease ([place]) 09/25/2011   Hyperlipidemia    Hypertension    SOB (shortness of breath)    Coronary artery disease of native artery of native heart with stable angina pectoris ([place])    Carotid artery disease ([place])     Daneil Dan 04/27/2021, 5:07 PM  [facility] [address], [place], [address] Phone: [phone]   Fax:  [phone]  Name: [name] MRN: [id number] Date of Birth: 1926-01-03

## 2021-04-29 ENCOUNTER — Ambulatory Visit: Payer: Medicare Other | Admitting: Physical Therapy

## 2021-04-29 ENCOUNTER — Ambulatory Visit: Payer: Medicare Other

## 2021-04-29 DIAGNOSIS — R262 Difficulty in walking, not elsewhere classified: Secondary | ICD-10-CM

## 2021-04-29 DIAGNOSIS — R269 Unspecified abnormalities of gait and mobility: Secondary | ICD-10-CM | POA: Diagnosis not present

## 2021-04-29 DIAGNOSIS — R531 Weakness: Secondary | ICD-10-CM | POA: Diagnosis not present

## 2021-04-29 NOTE — Therapy (Signed)
[facility] [address] [place], [place], [address] Phone: [phone]   Fax:  [phone]  Physical Therapy Treatment  Patient Details  Name: DEMECIA NORTHWAY MRN: [id number] Date of Birth: 04/02/1926 Referring Provider (PT): Derrel Nip MD   Encounter Date: 04/29/2021   PT End of Session - 04/29/21 1429     Visit Number 11    Number of Visits 25    Date for PT Re-Evaluation 05/12/21    Authorization Time Period INITIAL EVAL = 02/17/2021    PT Start Time 1340    PT Stop Time 1420    PT Time Calculation (min) 40 min    Equipment Utilized During Treatment Gait belt    Activity Tolerance Patient tolerated treatment well    Behavior During Therapy [facility] for tasks assessed/performed             Past Medical History:  Diagnosis Date   C. difficile colitis    CAD (coronary artery disease)    a. s/p CABG 1999; b. Nuclear 10/11, no scar or ischemia, EF 68%; b. [place] 06/18/14: no evidence of infarction or ischemia, EF 77%, low risk study; c. 02/2019 MV: EF 56%, ? mild lat ischemia->low risk.   Carotid artery disease ([place])    a. Doppler January, 2012, 40-59% bilateral; b. 02/2017 U/S: 1-39% bilat ICA stenosis.   Closed fracture pubis ([place]) 07/04/2017   COPD (chronic obstructive pulmonary disease) (HCC)    Diffuse cystic mastopathy    Diverticulitis    last colonoscopy  incomplete Jan 2011   Dizziness    stable now (Oct 2011) patient tells me question of TIA, we will obtain records   Gait abnormality    Patient complains of "wobbly gait", December, 2013   GERD (gastroesophageal reflux disease)    History of migraines    Hx of CABG    a. 3 vessel in 1999   Hyperlipidemia    Hypertension    Indigestion    3 weeks, October 2011   Kidney stones    Rheumatic fever    Sinus bradycardia    Mild, December, 2013   SOB (shortness of breath)    Syncopal episodes    after 18 holes of golf and increase  hydrochlorothiazide, resolved     Past Surgical History:  Procedure Laterality Date   [place]     multiple BIL   CATARACT EXTRACTION Right 2009   CATARACT EXTRACTION Left 2011   COLONOSCOPY  [id number]   Dr. Jamal Collin   CORONARY ARTERY BYPASS GRAFT  1999   esophagus stretched     [place]   due to metrorrhagia    There were no vitals filed for this visit.   Subjective Assessment - 04/29/21 1344     Subjective Pt reports that she is able to do some of the exercises but not all of them. She is going to her hearing aids tomorrow.    Patient is accompained by: Family member   Daughter   Pertinent History Patient is a 87 year old female with referral from PCP for progressive weakness. Patient reports she is still mostly independent just moving around slower and not as active. She states she lives alone in a 2 story home without an AD for mobility. She reports her bedroom and bathroom are on 2nd level and that she has  to negotiate steps with some difficulty. Past medical history is significant for HTN, CAD, Hardening of Aorta, Sinus Bradycardia, Diastolic CHF, COPD, Hyperparathyroidism, Osteoporosis, CKD- Stage 3, Hyperlipidemia, LBP, COVID- Dec 2021.    Limitations Lifting;Standing;Walking;House hold activities    How long can you sit comfortably? NA    How long can you stand comfortably? maybe 5-10 min    How long can you walk comfortably? < 500 feet probably    Diagnostic tests NA    Patient Stated Goals I want to get my legs stronger    Currently in Pain? No/denies            No falls since last appointment   NMR  Tandem 2 x 30 sec alternating feet    Tandem 2 x 10 with horizontal head turns  -Intermittent use of hands to steady herself   Toe Taps 2 x 10 alternating feet on 6 inch step  Semi-tandem Horizontal Head Turns 2 x 10 alternating feet  Semi-tandem  Vertical Head Turns 2 x 10 alternating feet  Semi-tandem Eyes Closed 2 x 30 sec  Semi-tandem Eyes Closed Horizontal Head Turns 2 x 10  Semi-tandem Eyes Closed Vertical Head Turns 2 x 10   THEREX  Stairs independently 6 stairs x 4 with use of left railing and alternating steps   Sit to Stand 1 x 15  -min VC to perform concentric face quickly and eccentric phase slowly   Sit to Stand 1 x 10  Sit to Stand 1 x 5    Updated HEP and educated patient on changes to exercises and addition of new exercises.  Semi-tandem eyes closed horizontal head turns 3 x 10 x 7 days per week  Semi-tandem eyes closed vertical head turns 3 x 10 x 7 days per week  Tandem eyes open 5 x 30 sec x 7 days per week      PT Short Term Goals - 04/29/21 1430       PT SHORT TERM GOAL #1   Title Pt will be independent with HEP in order to improve strength and balance in order to decrease fall risk and improve function at home and work.    Baseline 02/17/2021: Patient reports she has not been performing any exercises in the home.    Time 6    Period Weeks    Status New    Target Date 03/31/21               PT Long Term Goals - 04/29/21 1430       PT LONG TERM GOAL #1   Title Patient will increase 10 meter walk test to >1.3m/s as to improve gait speed for better community ambulation and to reduce fall risk.    Baseline 02/17/2021= 0.7 m/s without an AD with mild unsteadiness 04/27/21: 0.77 m/sec    Time 12    Period Weeks    Status On-going      PT LONG TERM GOAL #2   Title Pt will improve BERG by at least 3 points in order to demonstrate clinically significant improvement in balance.    Baseline 02/17/2021= 49/56 04/2021: 45/56    Time 12    Period Weeks    Status On-going      PT LONG TERM GOAL #3   Title Pt will improve FOTO to target score of 56/100 to display perceived improvements in ability to complete ADL's.    Baseline 02/17/2021= 45/100 8/9: 73/56    Time 12  Period Weeks    Status Achieved       PT LONG TERM GOAL #4   Title Pt will decrease TUG to below 14 seconds/decrease in order to demonstrate decreased fall risk.    Baseline 02/17/2021= 17.35 sec without an AD 8/9: 14.90 sec    Time 12    Period Weeks    Status On-going      PT LONG TERM GOAL #5   Title Pt will decrease 5TSTS by at least 3 seconds in order to demonstrate clinically significant improvement in LE strength.    Baseline 02/17/2021= 20.03 sec without UE support 8/9/022= 14.30 sec    Time 12    Period Weeks    Status Achieved                   Plan - 04/29/21 1445     Clinical Impression Statement Pt presents for f/u treatment for generalized weakness and imbalance. She demonstrates improved static balance with ability to perform semi-tandem and tandem with horizontal head turns. She will continue to benefit from skilled PT to progress LE strength and static and dynamic balance activities to decrease her risk for falling so that she can continue to live indepently.    Personal Factors and Comorbidities Age;Comorbidity 3+    Comorbidities COPD/Diastolic CHF, HTN, high fall risk, former smoker,    Examination-Activity Limitations Carry;Lift;Locomotion Level;Squat;Stairs;Stand;Transfers    Examination-Participation Restrictions Church;Cleaning;Community Activity;Shop;Volunteer;Laundry;Yard Work    Stability/Clinical Decision Making Stable/Uncomplicated    Rehab Potential Good    PT Frequency 2x / week    PT Duration 12 weeks    PT Treatment/Interventions ADLs/Self Care Home Management;Cryotherapy;Moist Heat;Gait training;Stair training;Functional mobility training;Therapeutic activities;Therapeutic exercise;Balance training;Neuromuscular re-education;Patient/family education;Energy conservation;DME Instruction;Manual techniques;Passive range of motion    PT Next Visit Plan Progress hip strengthening activities and static and dynamic balance exercises    PT Home Exercise Plan Access Code: [email or web address]  URL:  [email or web address]    Consulted and Agree with Plan of Care Patient             Patient will benefit from skilled therapeutic intervention in order to improve the following deficits and impairments:  Decreased balance, Decreased mobility, Difficulty walking, Cardiopulmonary status limiting activity, Decreased activity tolerance, Decreased strength, Decreased endurance, Decreased coordination, Hypomobility  Visit Diagnosis: Generalized weakness  Abnormality of gait and mobility  Difficulty in walking, not elsewhere classified     Problem List Patient Active Problem List   Diagnosis Date Noted   Sterile pyuria 04/06/2021   Hypotension 04/01/2021   Hyperparathyroidism due to renal insufficiency ([place]) 02/02/2021   CKD (chronic kidney disease) stage 3, GFR 30-59 ml/min ([place]) 01/15/2021   Grief 04/08/2020   History of esophageal stricture 04/08/2020   Leg swelling 03/09/2020   Atypical chest pain 01/10/2020   Anorexia 01/10/2020   B12 deficiency 01/09/2020   Generalized weakness 07/14/2019   Mild malnutrition ([place]) 40/98/1191   Diastolic CHF, chronic ([place]) 03/25/2019   Vitamin D deficiency 03/25/2019   Melena 02/03/2018   S/P excision of skin lesion, follow-up exam 12/26/2017   Impingement syndrome of left shoulder region 07/04/2017   Neck pain 07/04/2017   Allergic rhinitis 12/31/2016   SCC (squamous cell carcinoma), face 08/01/2016   Osteoporosis of forearm 02/13/2016   Dizziness and giddiness 02/13/2016   Insomnia secondary to anxiety 07/12/2015   Anxiety disorder 07/12/2015   Essential (primary) hypertension 10/07/2014   Sinus bradycardia    Hardening of the aorta (main artery of the heart) ([place]) 06/26/2014  History of Clostridium difficile colitis 10/20/2013   Personal history of other diseases of the digestive system 10/20/2013   Low back pain 10/04/2013   Encounter for general adult medical examination without abnormal findings 02/07/2013    Chest pain at rest 12/26/2011   COPD, moderate ([place]) 12/05/2011   Presence of aortocoronary bypass graft    Stage 3b chronic kidney disease ([place]) 09/25/2011   Hyperlipidemia    Hypertension    SOB (shortness of breath)    Coronary artery disease of native artery of native heart with stable angina pectoris ([place])    Carotid artery disease ([place])    Bradly Chris PT, DPT  04/29/2021, 2:52 PM  [facility] [address] [place], [address] Phone: [phone]   Fax:  [phone]  Name: CORTANA VANDERFORD MRN: [id number] Date of Birth: Mar 30, 1926

## 2021-05-04 ENCOUNTER — Ambulatory Visit: Payer: Medicare Other | Admitting: Physical Therapy

## 2021-05-04 ENCOUNTER — Ambulatory Visit: Payer: Medicare Other

## 2021-05-04 VITALS — BP 129/53 | HR 71

## 2021-05-04 DIAGNOSIS — R269 Unspecified abnormalities of gait and mobility: Secondary | ICD-10-CM

## 2021-05-04 DIAGNOSIS — R531 Weakness: Secondary | ICD-10-CM

## 2021-05-04 DIAGNOSIS — R262 Difficulty in walking, not elsewhere classified: Secondary | ICD-10-CM | POA: Diagnosis not present

## 2021-05-04 NOTE — Therapy (Signed)
[facility] [address], [place], [address] Phone: [phone]   Fax:  [phone]  Physical Therapy Treatment  Patient Details  Name: [name] MRN: [id number] Date of Birth: Jun 13, 1926 Referring Provider (PT): Derrel Nip MD   Encounter Date: 05/04/2021   PT End of Session - 05/04/21 1907     Visit Number 12    Number of Visits 25    Date for PT Re-Evaluation 05/12/21    Authorization Time Period INITIAL EVAL = 02/17/2021    PT Start Time 1415    PT Stop Time 1500    PT Time Calculation (min) 45 min    Equipment Utilized During Treatment Gait belt    Activity Tolerance Patient tolerated treatment well    Behavior During Therapy [facility] for tasks assessed/performed             Past Medical History:  Diagnosis Date   C. difficile colitis    CAD (coronary artery disease)    a. s/p CABG 1999; b. [place] 10/11, no scar or ischemia, EF 68%; b. [place] 06/18/14: no evidence of infarction or ischemia, EF 77%, low risk study; c. 02/2019 MV: EF 56%, ? mild lat ischemia->low risk.   Carotid artery disease ([place])    a. Doppler January, 2012, 40-59% bilateral; b. 02/2017 U/S: 1-39% bilat ICA stenosis.   Closed fracture pubis ([place]) 07/04/2017   COPD (chronic obstructive pulmonary disease) (HCC)    Diffuse cystic mastopathy    Diverticulitis    last colonoscopy  incomplete Jan 2011   Dizziness    stable now (Oct 2011) patient tells me question of TIA, we will obtain records   Gait abnormality    Patient complains of "wobbly gait", December, 2013   GERD (gastroesophageal reflux disease)    History of migraines    Hx of CABG    a. 3 vessel in 1999   Hyperlipidemia    Hypertension    Indigestion    3 weeks, October 2011   Kidney stones    Rheumatic fever    Sinus bradycardia    Mild, December, 2013   SOB (shortness of breath)    Syncopal episodes    after 18 holes of golf and increase  hydrochlorothiazide, resolved     Past Surgical History:  Procedure Laterality Date   APPENDECTOMY  1950   BREAST BIOPSY Right 1994   BREAST CYST ASPIRATION     multiple BIL   CATARACT EXTRACTION Right 2009   CATARACT EXTRACTION Left 2011   COLONOSCOPY  9450,3888   Dr. Jamal Collin   CORONARY ARTERY BYPASS GRAFT  1999   esophagus stretched     [place]   due to metrorrhagia    Vitals:   05/04/21 1425  BP: (!) 129/53  Pulse: 71   NMR: Ankle Strategy>Hip Strategy   Semi-tandem Horizontal Head Turns 2 x 10 alternating feet  Semi-tandem Vertical Head Turns 2 x 10 alternating feet  Semi-tandem Eyes Closed 2 x 30 sec  Semi-tandem Eyes Closed Horizontal Head Turns 2 x 10  Semi-tandem Eyes Closed Vertical Head Turns 2 x 10  Tandem 2 x 30 sec   Use of live transcribe app during apt   THEREX:    Sit to Stand 2 x 10 with #8 DB  Standing Hip Abduction with 1 UE 2 x 10  Stand Heel Raises with 1 UE 2 x 10  Updated HEP and educated patient on changes to exercises and addition of new exercises. Perform sit to stand while holding water jug.    PT Education - 05/04/21 1906     Education Details form/technique for correct exercise    Person(s) Educated Patient    Methods Explanation;Demonstration;Verbal cues    Comprehension Verbalized understanding;Returned demonstration;Verbal cues required              PT Short Term Goals - 05/04/21 2102       PT SHORT TERM GOAL #1   Title Pt will be independent with HEP in order to improve strength and balance in order to decrease fall risk and improve function at home and work.    Baseline 02/17/2021: Patient reports she has not been performing any exercises in the home.    Time 6    Period Weeks    Status New    Target Date 03/31/21               PT Long Term Goals - 05/04/21 2102       PT LONG TERM GOAL #1   Title Patient will increase 10 meter walk test to >1.87m/s as to  improve gait speed for better community ambulation and to reduce fall risk.    Baseline 02/17/2021= 0.7 m/s without an AD with mild unsteadiness 04/27/21: 0.77 m/sec    Time 12    Period Weeks    Status On-going      PT LONG TERM GOAL #2   Title Pt will improve BERG by at least 3 points in order to demonstrate clinically significant improvement in balance.    Baseline 02/17/2021= 49/56 04/2021: 45/56    Time 12    Period Weeks    Status On-going      PT LONG TERM GOAL #3   Title Pt will improve FOTO to target score of 56/100 to display perceived improvements in ability to complete ADL's.    Baseline 02/17/2021= 45/100 8/9: 73/56    Time 12    Period Weeks    Status Achieved      PT LONG TERM GOAL #4   Title Pt will decrease TUG to below 14 seconds/decrease in order to demonstrate decreased fall risk.    Baseline 02/17/2021= 17.35 sec without an AD 8/9: 14.90 sec    Time 12    Period Weeks    Status On-going      PT LONG TERM GOAL #5   Title Pt will decrease 5TSTS by at least 3 seconds in order to demonstrate clinically significant improvement in LE strength.    Baseline 02/17/2021= 20.03 sec without UE support 8/9/022= 14.30 sec    Time 12    Period Weeks    Status Achieved                   Plan - 05/04/21 2058     Clinical Impression Statement Pt presents for f/u treatment for generalized weakness and imbalance. She demonstrates improvement with static balance with ability perform semi-tandem and tandem poses with use of ankle strategies more than hip strategies. Pt also exhibits increased LE strength with ability to perform sit to stand with increased resistance. She will continue to benefit from skilled PT to increase her LE strength and static and dynamic balance in order to decrease her risk of falling and to safely navigate her home environment.    Personal Factors and Comorbidities Age;Comorbidity 3+    Comorbidities COPD/Diastolic CHF, HTN, high  fall risk, former smoker,     Examination-Activity Limitations Carry;Lift;Locomotion Level;Squat;Stairs;Stand;Transfers    Examination-Participation Restrictions Church;Cleaning;Community Activity;Shop;Volunteer;Laundry;Yard Work    Stability/Clinical Decision Making Stable/Uncomplicated    Rehab Potential Good    PT Frequency 2x / week    PT Duration 12 weeks    PT Treatment/Interventions ADLs/Self Care Home Management;Cryotherapy;Moist Heat;Gait training;Stair training;Functional mobility training;Therapeutic activities;Therapeutic exercise;Balance training;Neuromuscular re-education;Patient/family education;Energy conservation;DME Instruction;Manual techniques;Passive range of motion    PT Next Visit Plan Trial ambulating outside. Progress hip strengthening activities and static and dynamic balance exercises    PT Home Exercise Plan Access Code: [email or web address]  URL: [email or web address]    Consulted and Agree with Plan of Care Patient             Patient will benefit from skilled therapeutic intervention in order to improve the following deficits and impairments:  Decreased balance, Decreased mobility, Difficulty walking, Cardiopulmonary status limiting activity, Decreased activity tolerance, Decreased strength, Decreased endurance, Decreased coordination, Hypomobility  Visit Diagnosis: Generalized weakness  Abnormality of gait and mobility  Difficulty in walking, not elsewhere classified     Problem List Patient Active Problem List   Diagnosis Date Noted   Sterile pyuria 04/06/2021   Hypotension 04/01/2021   Hyperparathyroidism due to renal insufficiency ([place]) 02/02/2021   CKD (chronic kidney disease) stage 3, GFR 30-59 ml/min (HCC) 01/15/2021   Grief 04/08/2020   History of esophageal stricture 04/08/2020   Leg swelling 03/09/2020   Atypical chest pain 01/10/2020   Anorexia 01/10/2020   B12 deficiency 01/09/2020   Generalized weakness 07/14/2019   Mild malnutrition ([place]) 26/71/2458    Diastolic CHF, chronic ([place]) 03/25/2019   Vitamin D deficiency 03/25/2019   Melena 02/03/2018   S/P excision of skin lesion, follow-up exam 12/26/2017   Impingement syndrome of left shoulder region 07/04/2017   Neck pain 07/04/2017   Allergic rhinitis 12/31/2016   SCC (squamous cell carcinoma), face 08/01/2016   Osteoporosis of forearm 02/13/2016   Dizziness and giddiness 02/13/2016   Insomnia secondary to anxiety 07/12/2015   Anxiety disorder 07/12/2015   Essential (primary) hypertension 10/07/2014   Sinus bradycardia    Hardening of the aorta (main artery of the heart) ([place]) 06/26/2014   History of Clostridium difficile colitis 10/20/2013   Personal history of other diseases of the digestive system 10/20/2013   Low back pain 10/04/2013   Encounter for general adult medical examination without abnormal findings 02/07/2013   Chest pain at rest 12/26/2011   COPD, moderate ([place]) 12/05/2011   Presence of aortocoronary bypass graft    Stage 3b chronic kidney disease ([place]) 09/25/2011   Hyperlipidemia    Hypertension    SOB (shortness of breath)    Coronary artery disease of native artery of native heart with stable angina pectoris ([place])    Carotid artery disease ([place])    Bradly Chris PT, DPT  05/04/2021, 9:05 PM   [facility] [address] [place], [address] Phone: [phone]   Fax:  [phone]  Name: [name] MRN: [id number] Date of Birth: 1925/12/22

## 2021-05-05 ENCOUNTER — Telehealth: Payer: Medicare Other

## 2021-05-05 ENCOUNTER — Telehealth: Payer: Self-pay | Admitting: *Deleted

## 2021-05-05 NOTE — Telephone Encounter (Signed)
  Care Management   Follow Up Note   05/05/2021 Name: [name] MRN: [id number] DOB: [birth year]   Referred by: [name], MD Reason for referral : No chief complaint on file.   An unsuccessful telephone outreach was attempted today. The patient was referred to the case management team for assistance with care management and care coordination.   Follow Up Plan: RNCM will seek assistance from [facility] in rescheduling appointment within the next 30 days.  Hubert Azure RN, MSN RN Care Management Coordinator [place] [phone] [email or web address]

## 2021-05-06 ENCOUNTER — Ambulatory Visit: Payer: Medicare Other | Admitting: Physical Therapy

## 2021-05-06 ENCOUNTER — Ambulatory Visit: Payer: Medicare Other

## 2021-05-08 ENCOUNTER — Other Ambulatory Visit: Payer: Self-pay | Admitting: Internal Medicine

## 2021-05-10 ENCOUNTER — Encounter: Payer: Self-pay | Admitting: Internal Medicine

## 2021-05-11 ENCOUNTER — Ambulatory Visit: Payer: Medicare Other | Admitting: Physical Therapy

## 2021-05-11 ENCOUNTER — Ambulatory Visit: Payer: Medicare Other

## 2021-05-11 ENCOUNTER — Other Ambulatory Visit: Payer: Self-pay | Admitting: Cardiovascular Disease

## 2021-05-13 ENCOUNTER — Other Ambulatory Visit: Payer: Self-pay

## 2021-05-13 ENCOUNTER — Ambulatory Visit: Payer: Medicare Other | Admitting: Physical Therapy

## 2021-05-13 ENCOUNTER — Ambulatory Visit: Payer: Medicare Other

## 2021-05-13 DIAGNOSIS — R531 Weakness: Secondary | ICD-10-CM

## 2021-05-13 DIAGNOSIS — R262 Difficulty in walking, not elsewhere classified: Secondary | ICD-10-CM

## 2021-05-13 DIAGNOSIS — R269 Unspecified abnormalities of gait and mobility: Secondary | ICD-10-CM | POA: Diagnosis not present

## 2021-05-13 NOTE — Therapy (Addendum)
[facility] [address], [place], [address] Phone: [phone]   Fax:  [phone]  Physical Therapy Treatment/ Recertification  Patient Details  Name: [name] MRN: [id number] Date of Birth: 15-May-1926 Referring Provider (PT): Derrel Nip MD   Encounter Date: 05/13/2021   PT End of Session - 05/13/21 1442     Visit Number 13    Number of Visits 25    Date for PT Re-Evaluation 05/12/21    Authorization Time Period INITIAL EVAL = 02/17/2021    PT Start Time 1415    PT Stop Time 1500    PT Time Calculation (min) 45 min    Equipment Utilized During Treatment Gait belt    Activity Tolerance Patient tolerated treatment well    Behavior During Therapy [facility] for tasks assessed/performed             Past Medical History:  Diagnosis Date   C. difficile colitis    CAD (coronary artery disease)    a. s/p CABG 1999; b. Nuclear 10/11, no scar or ischemia, EF 68%; b. [place] 06/18/14: no evidence of infarction or ischemia, EF 77%, low risk study; c. 02/2019 MV: EF 56%, ? mild lat ischemia->low risk.   Carotid artery disease ([place])    a. Doppler January, 2012, 40-59% bilateral; b. 02/2017 U/S: 1-39% bilat ICA stenosis.   Closed fracture pubis ([place]) 07/04/2017   COPD (chronic obstructive pulmonary disease) (HCC)    Diffuse cystic mastopathy    Diverticulitis    last colonoscopy  incomplete Jan 2011   Dizziness    stable now (Oct 2011) patient tells me question of TIA, we will obtain records   Gait abnormality    Patient complains of "wobbly gait", December, 2013   GERD (gastroesophageal reflux disease)    History of migraines    Hx of CABG    a. 3 vessel in 1999   Hyperlipidemia    Hypertension    Indigestion    3 weeks, October 2011   Kidney stones    Rheumatic fever    Sinus bradycardia    Mild, December, 2013   SOB (shortness of breath)    Syncopal episodes    after 18 holes of golf and  increase hydrochlorothiazide, resolved     Past Surgical History:  Procedure Laterality Date   [place]     multiple BIL   CATARACT EXTRACTION Right 2009   CATARACT EXTRACTION Left 2011   COLONOSCOPY  [id number]   Dr. Jamal Collin   CORONARY ARTERY BYPASS GRAFT  1999   esophagus stretched     [place]   due to metrorrhagia    There were no vitals filed for this visit.   Subjective Assessment - 05/13/21 1417     Subjective Pt reports still having difficulty hearing people speak. She is going to hear aid specialist.    Patient is accompained by: Family member   Daughter   Pertinent History Patient is a 87 year old female with referral from PCP for progressive weakness. Patient reports she is still mostly independent just moving around slower and not as active. She states she lives alone in a 2 story home without an AD for mobility. She reports her bedroom and bathroom are on 2nd level and that she has to negotiate steps with some difficulty. Past medical history  is significant for HTN, CAD, Hardening of Aorta, Sinus Bradycardia, Diastolic CHF, COPD, Hyperparathyroidism, Osteoporosis, CKD- Stage 3, Hyperlipidemia, LBP, COVID- Dec 2021.    Limitations Lifting;Standing;Walking;House hold activities    How long can you sit comfortably? NA    How long can you stand comfortably? maybe 5-10 min    How long can you walk comfortably? < 500 feet probably    Diagnostic tests NA    Patient Stated Goals I want to get my legs stronger    Pain Score 0-No pain            NMR:   Semi-tandem EO 30 sec  Semi-tandem EO Horizontal Head Turns 2 x 10  Sem-tandem EO Vertical Head Turns 2 x 10  Semi-tandem EC 30 sec  Semi-tandem EC Horizontal Head Turns 2 x 10  Sem-tandem EC Vertical Head Turns 2 x 10  -Ankle>Hip Strategies  Tandem 2 x 30 sec  -Ankle>Hip Strategies     THEREX:   10 min  ambulating outside with rollator  -Education about how to set breaks and sit in rollator  -Discussion about ordering rollator through PCP  Sit to stand 1 x 10 with 2 x #4 DB  -min VC to perform concentric phase and eccentric phase fast    Updated HEP and educated patient on changes to exercises and addition of new exercises        PT Education - 05/13/21 1443     Education Details form/technique for correct exercise    Person(s) Educated Patient    Methods Explanation;Demonstration;Verbal cues;Handout    Comprehension Verbalized understanding;Verbal cues required;Returned demonstration              PT Short Term Goals - 05/13/21 1442       PT SHORT TERM GOAL #1   Title Pt will be independent with HEP in order to improve strength and balance in order to decrease fall risk and improve function at home and work.    Baseline 02/17/2021: Patient reports she has not been performing any exercises in the home.    Time 6    Period Weeks    Status New    Target Date 03/31/21               PT Long Term Goals - 05/13/21 1442       PT LONG TERM GOAL #1   Title Patient will increase 10 meter walk test to >1.62m/s as to improve gait speed for better community ambulation and to reduce fall risk.    Baseline 02/17/2021= 0.7 m/s without an AD with mild unsteadiness 04/27/21: 0.77 m/sec    Time 12    Period Weeks    Status On-going      PT LONG TERM GOAL #2   Title Pt will improve BERG by at least 3 points in order to demonstrate clinically significant improvement in balance.    Baseline 02/17/2021= 49/56 04/2021: 45/56    Time 12    Period Weeks    Status On-going      PT LONG TERM GOAL #3   Title Pt will improve FOTO to target score of 56/100 to display perceived improvements in ability to complete ADL's.    Baseline 02/17/2021= 45/100 8/9: 73/56    Time 12    Period Weeks    Status Achieved      PT LONG TERM GOAL #4   Title Pt will decrease TUG to below 14 seconds/decrease  in order to demonstrate decreased fall  risk.    Baseline 02/17/2021= 17.35 sec without an AD 8/9: 14.90 sec    Time 12    Period Weeks    Status On-going      PT LONG TERM GOAL #5   Title Pt will decrease 5TSTS by at least 3 seconds in order to demonstrate clinically significant improvement in LE strength.    Baseline 02/17/2021= 20.03 sec without UE support 8/9/022= 14.30 sec    Time 12    Period Weeks    Status Achieved                   Plan - 05/13/21 1509     Clinical Impression Statement Pt presents for f/u for generalized weakness and imbalance. She exhibits consistent progress with ability to maintain semi-tandem, eyes closed position and tandem positions and consistency with LE strength with ability to complete sit to stand with increased resistance. PT recommends that pt utilize rollator for outside ambulation given her ability to safely brake and sit in rollator and increased steadiness with ambulation with rollator.  She will continue to benefit from skilled PT to increase her LE strength and static and dynamic balance in order to decrease her risk of falling and to safely navigate her home environment.    Personal Factors and Comorbidities Age;Comorbidity 3+    Comorbidities COPD/Diastolic CHF, HTN, high fall risk, former smoker,    Examination-Activity Limitations Carry;Lift;Locomotion Level;Squat;Stairs;Stand;Transfers    Examination-Participation Restrictions Church;Cleaning;Community Activity;Shop;Volunteer;Laundry;Yard Work    Stability/Clinical Decision Making Stable/Uncomplicated    Rehab Potential Good    PT Frequency 2x / week    PT Duration 12 weeks    PT Treatment/Interventions ADLs/Self Care Home Management;Cryotherapy;Moist Heat;Gait training;Stair training;Functional mobility training;Therapeutic activities;Therapeutic exercise;Balance training;Neuromuscular re-education;Patient/family education;Energy conservation;DME Instruction;Manual techniques;Passive  range of motion    PT Next Visit Plan Dynamic balance exercises and progress static exercises. Progress hip strengthening with priority given to exercises aside from sit to stand.    PT Home Exercise Plan Access Code: [email or web address]  URL: [email or web address]    Consulted and Agree with Plan of Care Patient            HEP includes:  Access Code: [email or web address] URL: [email or web address] Date: 05/13/2021 Prepared by: Bradly Chris  Exercises Standing March with Counter Support - 1 x daily - 3 x weekly - 2 sets - 10 reps - 2 hold Standing Hip Abduction with Counter Support - 1 x daily - 3 x weekly - 2 sets - 10 reps - 2 hold Mini Squat with Counter Support - 1 x daily - 3 x weekly - 2 sets - 10 reps - 2 hold Standing Knee Flexion with Counter Support - 1 x daily - 3 x weekly - 2 sets - 10 reps - 2 hold Heel rises with counter support - 1 x daily - 3 x weekly - 2 sets - 10 reps - 2 hold Standing Ankle Dorsiflexion with Table Support - 1 x daily - 3 x weekly - 2 sets - 10 reps - 2 hold Half Tandem Stance Balance with Eyes Closed - 1 x daily - 7 x weekly - 1 sets - 5 reps - 30 hold Standing Knee Flexion AROM with Chair Support - 1 x daily - 3 x weekly - 2 sets - 10 reps Sit to Stand - 1 x daily - 3 x weekly - 2 sets - 10 reps    Patient will benefit from skilled therapeutic intervention in order to improve the following deficits and  impairments:  Decreased balance, Decreased mobility, Difficulty walking, Cardiopulmonary status limiting activity, Decreased activity tolerance, Decreased strength, Decreased endurance, Decreased coordination, Hypomobility  Visit Diagnosis: Generalized weakness  Abnormality of gait and mobility  Difficulty in walking, not elsewhere classified     Problem List Patient Active Problem List   Diagnosis Date Noted   Sterile pyuria 04/06/2021   Hypotension 04/01/2021   Hyperparathyroidism due to renal insufficiency ([place]) 02/02/2021    CKD (chronic kidney disease) stage 3, GFR 30-59 ml/min (HCC) 01/15/2021   Grief 04/08/2020   History of esophageal stricture 04/08/2020   Leg swelling 03/09/2020   Atypical chest pain 01/10/2020   Anorexia 01/10/2020   B12 deficiency 01/09/2020   Generalized weakness 07/14/2019   Mild malnutrition ([place]) 37/16/9678   Diastolic CHF, chronic ([place]) 03/25/2019   Vitamin D deficiency 03/25/2019   Melena 02/03/2018   S/P excision of skin lesion, follow-up exam 12/26/2017   Impingement syndrome of left shoulder region 07/04/2017   Neck pain 07/04/2017   Allergic rhinitis 12/31/2016   SCC (squamous cell carcinoma), face 08/01/2016   Osteoporosis of forearm 02/13/2016   Dizziness and giddiness 02/13/2016   Insomnia secondary to anxiety 07/12/2015   Anxiety disorder 07/12/2015   Essential (primary) hypertension 10/07/2014   Sinus bradycardia    Hardening of the aorta (main artery of the heart) ([place]) 06/26/2014   History of Clostridium difficile colitis 10/20/2013   Personal history of other diseases of the digestive system 10/20/2013   Low back pain 10/04/2013   Encounter for general adult medical examination without abnormal findings 02/07/2013   Chest pain at rest 12/26/2011   COPD, moderate ([place]) 12/05/2011   Presence of aortocoronary bypass graft    Stage 3b chronic kidney disease ([place]) 09/25/2011   Hyperlipidemia    Hypertension    SOB (shortness of breath)    Coronary artery disease of native artery of native heart with stable angina pectoris ([place])    Carotid artery disease ([place])    Bradly Chris PT, DPT  05/13/2021, 3:30 PM  [facility] [address] [place], [address] Phone: [phone]   Fax:  [phone]  Name: [name] MRN: [id number] Date of Birth: 1925-10-12

## 2021-05-17 ENCOUNTER — Other Ambulatory Visit: Payer: Self-pay

## 2021-05-17 ENCOUNTER — Ambulatory Visit (INDEPENDENT_AMBULATORY_CARE_PROVIDER_SITE_OTHER): Payer: Medicare Other | Admitting: Family

## 2021-05-17 ENCOUNTER — Encounter: Payer: Self-pay | Admitting: Family

## 2021-05-17 VITALS — BP 130/76 | HR 77 | Ht 62.52 in | Wt 119.2 lb

## 2021-05-17 DIAGNOSIS — E538 Deficiency of other specified B group vitamins: Secondary | ICD-10-CM | POA: Diagnosis not present

## 2021-05-17 DIAGNOSIS — I6523 Occlusion and stenosis of bilateral carotid arteries: Secondary | ICD-10-CM

## 2021-05-17 DIAGNOSIS — Z23 Encounter for immunization: Secondary | ICD-10-CM

## 2021-05-17 DIAGNOSIS — K1370 Unspecified lesions of oral mucosa: Secondary | ICD-10-CM

## 2021-05-17 MED ORDER — MAGIC MOUTHWASH W/LIDOCAINE: 5.0000 mL | Freq: Every day | ORAL | 0 refills | Status: AC | PRN

## 2021-05-17 MED ORDER — CYANOCOBALAMIN 1000 MCG/ML IJ SOLN
1000.0000 ug | Freq: Once | INTRAMUSCULAR | Status: AC
  Administered 2021-05-17: 1000 ug via INTRAMUSCULAR

## 2021-05-17 NOTE — Assessment & Plan Note (Addendum)
Pending b12 and resumption of monthly b12.  If fatigue does not improve with resumption of B12, advised patient to follow-up PCP for further evaluation.

## 2021-05-17 NOTE — Patient Instructions (Signed)
Start abreva over the counter for cold sore while we await on viral culture.  I also refilled the Magic mouthwash which has lidocaine in it as a numbing agent to help control the pain. If no improvement or diagnosis from viral culture, as discussed today, I would feel most comfortable with you return to dermatology, Dr. Darrick Huntsman for further evaluation and treatment.  We have given you your B12 injection today, you may return in 1 month's time for your next B12 injection.   Please continue to follow Dr Derrel Nip for ongoing B12 injections

## 2021-05-17 NOTE — Progress Notes (Signed)
Subjective:    Patient ID: [name], female    DOB: March 30, 1926, 87 y.o.   MRN: [id number]  CC: [name] is a 87 y.o. female who presents today for an acute visit.    HPI: Complains of sore inside of her mouth right side upper month.  Painful to touch.  Onset of lesions  7-8 months ago Lesions are tender. Lesions will resolve and return. Lesions move around.   No trouble swallowing, choking.   Dr Derrel Nip sent in nystatin oral suspension 12/2020 ED physican sent in magic mouthwash 01/25/2021  No oral appliance.   She has h/o SCC.        H/o copd She has been compliant with combivent prn. SOB at baseline.   She saw Dr Kellie Moor , dermatology, one month ago ( unable to see these notes in Epic. Advised to use mint, gum as she suspected dry mouth.    She has also seen her dentist which advised that her teeth were healthy and he had no concerns.   History of B12 deficiency.  She is not had a B12 injection in several months.  Per chart, no B12 injection in the last 4 weeks.  she would like to resume as she felt this is been helpful in the past for her fatigue  Seen by Dr. Rockey Situ 04/12/2021 for CAD, angina.  She is compliant with nitro as needed.  History of coronary bypass  Hospitalized for hypertension last month.   Echocardiogram July 2022, normal ejection fraction.  She was seen by Dr. Tami Ribas 03/02/2021; unable to find any office visit notes as a relates this consult in epic  HISTORY:  Past Medical History:  Diagnosis Date   C. difficile colitis    CAD (coronary artery disease)    a. s/p CABG 1999; b. [place] 10/11, no scar or ischemia, EF 68%; b. [place] 06/18/14: no evidence of infarction or ischemia, EF 77%, low risk study; c. 02/2019 MV: EF 56%, ? mild lat ischemia->low risk.   Carotid artery disease ([place])    a. Doppler January, 2012, 40-59% bilateral; b. 02/2017 U/S: 1-39% bilat ICA stenosis.   Closed fracture pubis ([place]) 07/04/2017   COPD  (chronic obstructive pulmonary disease) (HCC)    Diffuse cystic mastopathy    Diverticulitis    last colonoscopy  incomplete Jan 2011   Dizziness    stable now (Oct 2011) patient tells me question of TIA, we will obtain records   Gait abnormality    Patient complains of "wobbly gait", December, 2013   GERD (gastroesophageal reflux disease)    History of migraines    Hx of CABG    a. 3 vessel in 1999   Hyperlipidemia    Hypertension    Indigestion    3 weeks, October 2011   Kidney stones    Rheumatic fever    Sinus bradycardia    Mild, December, 2013   SOB (shortness of breath)    Syncopal episodes    after 18 holes of golf and increase hydrochlorothiazide, resolved    Past Surgical History:  Procedure Laterality Date   APPENDECTOMY  1950   BREAST BIOPSY Right 1994   BREAST CYST ASPIRATION     multiple BIL   CATARACT EXTRACTION Right 2009   CATARACT EXTRACTION Left 2011   COLONOSCOPY  8338,2505   Dr. Jamal Collin   CORONARY ARTERY BYPASS GRAFT  1999   esophagus stretched     TONSILLECTOMY  1939   TOTAL ABDOMINAL  HYSTERECTOMY  1968   due to metrorrhagia   Family History  Problem Relation Age of Onset   Heart disease Mother    Heart disease Father    Cancer Neg Hx    Drug abuse Neg Hx    Breast cancer Neg Hx     Allergies: Ciprofloxacin Current Outpatient Medications on File Prior to Visit  Medication Sig Dispense Refill   atorvastatin (LIPITOR) 40 MG tablet TAKE 1 TABLET BY MOUTH DAILY 90 tablet 1   clopidogrel (PLAVIX) 75 MG tablet TAKE 1 TABLET BY MOUTH DAILY 90 tablet 0   Cranberry 125 MG TABS Take 1 tablet by mouth daily.     esomeprazole (NEXIUM) 40 MG capsule Take 40 mg by mouth daily at 12 noon.     furosemide (LASIX) 20 MG tablet Take 1 tablet 1-2 days per week as needed 10 tablet 1   Ipratropium-Albuterol (COMBIVENT) 20-100 MCG/ACT AERS respimat Inhale 1 puff into the lungs every 6 (six) hours as needed for wheezing. 4 g 6   isosorbide mononitrate (IMDUR) 60  MG 24 hr tablet TAKE 1 AND 1/2 TABLETS BY MOUTH TWO TIMES DAILY 270 tablet 3   losartan (COZAAR) 100 MG tablet TAKE 1 TABLET BY MOUTH DAILY 90 tablet 0   nitroGLYCERIN (NITROSTAT) 0.4 MG SL tablet PLACE 1 TABLET UNDER TONGUE EVERY 5 MIN AS NEEDED FOR CHEST PAIN IF NO RELIEF IN15 MIN CALL 911 (MAX 3 TABS) 25 tablet 3   Probiotic Product (PROBIOTIC PO) Take 1 capsule by mouth daily.     propranolol (INDERAL) 20 MG tablet TAKE ONE TABLET 3 TIMES DAILY AS NEEDED FOR TACHYCARDIA 270 tablet 3   ranolazine (RANEXA) 500 MG 12 hr tablet TAKE 1 TABLET BY MOUTH TWICE DAILY 180 tablet 0   vitamin B-12 (CYANOCOBALAMIN) 500 MCG tablet Take 500 mcg by mouth daily.     No current facility-administered medications on file prior to visit.    Social History   Tobacco Use   Smoking status: Former    Packs/day: 0.50    Years: 40.00    Pack years: 20.00    Types: Cigarettes    Quit date: 09/20/1983    Years since quitting: 37.6   Smokeless tobacco: Never  Vaping Use   Vaping Use: Never used  Substance Use Topics   Alcohol use: Not Currently    Comment: yes, social wine   Drug use: No    Review of Systems  Constitutional:  Positive for fatigue. Negative for chills and fever.  HENT:  Positive for mouth sores. Negative for congestion, postnasal drip and sore throat.   Respiratory:  Negative for cough.   Cardiovascular:  Negative for chest pain and palpitations.  Gastrointestinal:  Negative for nausea and vomiting.     Objective:    BP 130/76   Pulse 77   Ht 5' 2.52" (1.588 m)   Wt 119 lb 3.2 oz (54.1 kg)   SpO2 93%   BMI 21.44 kg/m    Physical Exam Vitals reviewed.  Constitutional:      Appearance: She is well-developed.  HENT:     Head: Normocephalic and atraumatic.     Right Ear: Hearing, tympanic membrane, ear canal and external ear normal. No decreased hearing noted. No drainage, swelling or tenderness. No middle ear effusion. No foreign body. Tympanic membrane is not erythematous or  bulging.     Left Ear: Hearing, tympanic membrane, ear canal and external ear normal. No decreased hearing noted. No drainage, swelling or  tenderness.  No middle ear effusion. No foreign body. Tympanic membrane is not erythematous or bulging.     Nose: Nose normal. No rhinorrhea.     Right Sinus: No maxillary sinus tenderness or frontal sinus tenderness.     Left Sinus: No maxillary sinus tenderness or frontal sinus tenderness.     Mouth/Throat:     Pharynx: Uvula midline. No oropharyngeal exudate or posterior oropharyngeal erythema.     Tonsils: No tonsillar abscesses.      Comments: Tender area with palpation right upper labial mucosa marked on diagram. Appearance 1cm deroofed vesicular lesion. No other oral lesions appreciated.  No confluent, white patches Eyes:     Conjunctiva/sclera: Conjunctivae normal.  Cardiovascular:     Rate and Rhythm: Regular rhythm.     Pulses: Normal pulses.     Heart sounds: Normal heart sounds.  Pulmonary:     Effort: Pulmonary effort is normal.     Breath sounds: Normal breath sounds. No wheezing, rhonchi or rales.  Lymphadenopathy:     Head:     Right side of head: No submental, submandibular, tonsillar, preauricular, posterior auricular or occipital adenopathy.     Left side of head: No submental, submandibular, tonsillar, preauricular, posterior auricular or occipital adenopathy.     Cervical: No cervical adenopathy.  Skin:    General: Skin is warm and dry.  Neurological:     Mental Status: She is alert.  Psychiatric:        Speech: Speech normal.        Behavior: Behavior normal.        Thought Content: Thought content normal.       Assessment & Plan:      I am having [name] start on magic mouthwash w/lidocaine. I am also having her maintain her Ipratropium-Albuterol, esomeprazole, propranolol, Probiotic Product (PROBIOTIC PO), vitamin B-12, ranolazine, losartan, clopidogrel, Cranberry, nitroGLYCERIN, furosemide, atorvastatin,  and isosorbide mononitrate. We administered cyanocobalamin.   Meds ordered this encounter  Medications   magic mouthwash w/lidocaine SOLN    Sig: Take 5 mLs by mouth daily as needed for up to 10 days for mouth pain.    Dispense:  50 mL    Refill:  0    Order Specific Question:   Supervising Provider    Answer:   Deborra Medina L [[id number]]   cyanocobalamin ((VITAMIN B-12)) injection 1,000 mcg     Return precautions given.   Risks, benefits, and alternatives of the medications and treatment plan prescribed today were discussed, and patient expressed understanding.   Education regarding symptom management and diagnosis given to patient on AVS.  Continue to follow with [name], MD for routine health maintenance.   [name] and I agreed with plan.   Mable Paris, FNP  I have spent 25 minutes with a patient including precharting, exam, reviewing medical records, and discussion plan of care.

## 2021-05-18 ENCOUNTER — Ambulatory Visit: Payer: Medicare Other | Admitting: Physical Therapy

## 2021-05-18 ENCOUNTER — Ambulatory Visit: Payer: Medicare Other

## 2021-05-20 ENCOUNTER — Ambulatory Visit: Payer: Medicare Other

## 2021-05-20 LAB — HERPES SIMPLEX VIRUS CULTURE
MICRO NUMBER:: 12309265
SPECIMEN QUALITY:: ADEQUATE

## 2021-05-20 LAB — TIQ-NTM

## 2021-05-21 NOTE — Assessment & Plan Note (Addendum)
Lesion is consistent with aphthous ulcerations. HSV culture negative. No evidence of thrush however she does use Combivent. She has h/o SCC and I have advised to continue to follow up with dermatology if lesion doesn't resolve with magic mouth wash, OTC abreva ( advised to stop abreva if HSV culture negative)

## 2021-05-25 NOTE — Progress Notes (Signed)
Pt called and scheduled for Friday at 3p.

## 2021-05-25 NOTE — Addendum Note (Signed)
Addended by: Burnard Hawthorne on: 05/25/2021 01:29 PM   Modules accepted: Level of Service

## 2021-05-26 ENCOUNTER — Telehealth: Payer: Self-pay

## 2021-05-26 ENCOUNTER — Other Ambulatory Visit: Payer: Self-pay

## 2021-05-26 DIAGNOSIS — R29898 Other symptoms and signs involving the musculoskeletal system: Secondary | ICD-10-CM

## 2021-05-26 NOTE — Telephone Encounter (Signed)
I spoke with patient & let her know that DME order had been signed for rolling walker. She stated that she just was not sure that she was ready to go that route. She would like to hold off. I advised that I would place order upfront in folder & she could pick up just to have on Friday when she comes for labs. If she changes her mind she will have.

## 2021-05-27 DIAGNOSIS — H353221 Exudative age-related macular degeneration, left eye, with active choroidal neovascularization: Secondary | ICD-10-CM | POA: Diagnosis not present

## 2021-05-28 ENCOUNTER — Other Ambulatory Visit: Payer: Medicare Other

## 2021-05-28 NOTE — Addendum Note (Signed)
Addended by: Leeanne Rio on: 05/28/2021 10:11 AM   Modules accepted: Orders

## 2021-06-01 ENCOUNTER — Encounter: Payer: Medicare Other | Admitting: Physical Therapy

## 2021-06-03 ENCOUNTER — Telehealth: Payer: Medicare Other

## 2021-06-04 ENCOUNTER — Telehealth: Payer: Self-pay | Admitting: *Deleted

## 2021-06-04 ENCOUNTER — Telehealth: Payer: Medicare Other

## 2021-06-04 NOTE — Telephone Encounter (Signed)
  Care Management   Follow Up Note   06/04/2021 Name: [name] MRN: [id number] DOB: 06-Apr-1926   Referred by: [name], MD Reason for referral : Chronic Care Management (COPD, HTN)   An unsuccessful telephone outreach was attempted today. The patient was referred to the case management team for assistance with care management and care coordination.   Follow Up Plan: RNCM will seek assistance from [facility] in rescheduling appointment within the next 30 days.  Hubert Azure RN, MSN RN Care Management Coordinator [place] [phone] [email or web address]

## 2021-06-07 NOTE — Telephone Encounter (Signed)
Pt has been rescheduled. 

## 2021-06-08 ENCOUNTER — Encounter: Payer: Medicare Other | Admitting: Physical Therapy

## 2021-06-10 ENCOUNTER — Ambulatory Visit: Payer: Medicare Other | Attending: Internal Medicine | Admitting: Physical Therapy

## 2021-06-10 ENCOUNTER — Other Ambulatory Visit: Payer: Self-pay | Admitting: Cardiovascular Disease

## 2021-06-10 DIAGNOSIS — R531 Weakness: Secondary | ICD-10-CM | POA: Diagnosis not present

## 2021-06-10 DIAGNOSIS — R262 Difficulty in walking, not elsewhere classified: Secondary | ICD-10-CM | POA: Diagnosis not present

## 2021-06-10 DIAGNOSIS — R269 Unspecified abnormalities of gait and mobility: Secondary | ICD-10-CM | POA: Insufficient documentation

## 2021-06-10 NOTE — Addendum Note (Signed)
Addended by: Daneil Dan on: 06/10/2021 01:54 PM   Modules accepted: Orders

## 2021-06-10 NOTE — Therapy (Signed)
[facility] [address], [place], [address] Phone: [phone]   Fax:  [phone]  Physical Therapy Treatment  Patient Details  Name: [name] MRN: [id number] Date of Birth: [birth year] Referring Provider (PT): Derrel Nip MD   Encounter Date: 06/10/2021   PT End of Session - 06/10/21 1421     Visit Number 14    Number of Visits 25    Date for PT Re-Evaluation 05/12/21    Authorization Time Period INITIAL EVAL = 02/17/2021    PT Start Time 1415    PT Stop Time 1500    PT Time Calculation (min) 45 min    Equipment Utilized During Treatment Gait belt    Activity Tolerance Patient tolerated treatment well    Behavior During Therapy [facility] for tasks assessed/performed             Past Medical History:  Diagnosis Date   C. difficile colitis    CAD (coronary artery disease)    a. s/p CABG 1999; b. Nuclear 10/11, no scar or ischemia, EF 68%; b. [place] 06/18/14: no evidence of infarction or ischemia, EF 77%, low risk study; c. 02/2019 MV: EF 56%, ? mild lat ischemia->low risk.   Carotid artery disease ([place])    a. Doppler January, 2012, 40-59% bilateral; b. 02/2017 U/S: 1-39% bilat ICA stenosis.   Closed fracture pubis ([place]) 07/04/2017   COPD (chronic obstructive pulmonary disease) (HCC)    Diffuse cystic mastopathy    Diverticulitis    last colonoscopy  incomplete Jan 2011   Dizziness    stable now (Oct 2011) patient tells me question of TIA, we will obtain records   Gait abnormality    Patient complains of "wobbly gait", December, 2013   GERD (gastroesophageal reflux disease)    History of migraines    Hx of CABG    a. 3 vessel in 1999   Hyperlipidemia    Hypertension    Indigestion    3 weeks, October 2011   Kidney stones    Rheumatic fever    Sinus bradycardia    Mild, December, 2013   SOB (shortness of breath)    Syncopal episodes    after 18 holes of golf and increase  hydrochlorothiazide, resolved     Past Surgical History:  Procedure Laterality Date   [place]     multiple BIL   CATARACT EXTRACTION Right 2009   CATARACT EXTRACTION Left 2011   COLONOSCOPY  [id number]   Dr. Jamal Collin   CORONARY ARTERY BYPASS GRAFT  1999   esophagus stretched     [place]   due to metrorrhagia    There were no vitals filed for this visit.  PHYSICAL PERFORMANCE   BERG BALANCE SCALE   ITEM                                                      SCORE 1.Sitting to Standing                                       4  2.Standing Unsupported  4 3.Sitting with back Unsupported                             4    But Feet Supported on Floor/Stool  4.Standing to Sitting                                       4  5.Transfers                                                 4 6.Standing Unsupported with Eyes Closed                     4 7.Standing Unsupported with Feet Together                   4  8.Reaching Forward Outstretched Arm while Standing          4 9.Pick Up Object from the Floor While Standing              4 10.Turning to Look Behind Over Left and Right Shoulders     4     While Standing  11.Turn 360 Degrees                                         4  12.Place Alternate Foot on Step or Stool                    3    while Standing Unsupported (8 STEPS >20 SEC) 13. Standing Unsupported One Foot in Front ()            1 14. Standing On One Leg                                     1                                                      Total= 49/56 Score of <40 on BBS associated with almost 100% fall risk (Shumway-Cook et  al.1997)  41-56= Low Fall Risk 21-40= Medium Fall Risk  0-20= High Fall Risk   (Berg et al. 1992)  TUG: 10.36 sec   10 Meter Walk Test   Gait Speed:  - Normal Gait speed Avg=  0.96 m/sec   >1 m/sec  Need Intervention to reduce falls risk   0.12 m/sec improvement represents a statistically significant improvement in gait speed    - 0.8 - 1.3 m/sec - community ambulator - associated with increased independence in self-care   Educated on use of rollator for ambulating outdoors and the need to continue home exercises to maintain gains.        PT Short Term Goals - 06/10/21 1540       PT SHORT TERM GOAL #1   Title Pt will be independent with HEP in  order to improve strength and balance in order to decrease fall risk and improve function at home and work.    Baseline 02/17/2021: Patient reports she has not been performing any exercises in the home. 9/22: Able to do on her own    Time 6    Period Weeks    Status Achieved    Target Date 03/31/21               PT Long Term Goals - 06/10/21 1435       PT LONG TERM GOAL #1   Title Patient will increase 10 meter walk test to >1.85ms as to improve gait speed for better community ambulation and to reduce fall risk.    Baseline 02/17/2021= 0.7 m/s without an AD with mild unsteadiness 04/27/21: 0.77 m/sec 9/22: 0.96 m/sec    Time 12    Period Weeks    Status Partially Met      PT LONG TERM GOAL #2   Title Pt will improve BERG by at least 3 points in order to demonstrate clinically significant improvement in balance.    Baseline 02/17/2021= 49/56 04/2021: 45/56 06/10/21: 49/56    Time 12    Period Weeks    Status Not Met      PT LONG TERM GOAL #3   Title Pt will improve FOTO to target score of 56/100 to display perceived improvements in ability to complete ADL's.    Baseline 02/17/2021= 45/100 8/9: 73/56 9/22: 63/56    Time 12    Period Weeks    Status Achieved      PT LONG TERM GOAL #4   Title Pt will decrease TUG to below 14 seconds/decrease in order to demonstrate decreased fall risk.    Baseline 02/17/2021= 17.35 sec without an AD 8/9: 14.90 sec 9/22: 10.36 sec    Time 12    Period Weeks    Status Achieved      PT LONG TERM  GOAL #5   Title Pt will decrease 5TSTS by at least 3 seconds in order to demonstrate clinically significant improvement in LE strength.    Baseline 02/17/2021= 20.03 sec without UE support 8/9/022= 14.30 sec    Time 12    Period Weeks    Status Achieved                   Plan - 06/10/21 1536     Clinical Impression Statement Pt presents for f/u and re-eval after having not been to PT for over a month. She has nearly met all her goals and demonstrates improvement with her balance, gait speed, and mobility that place her at a decreased risk of falling. PT recommends that she d/c from PT and continue to follow HEP to maintain LE strength and static and dynamic balance to maintain her low fall risk status to avoid falls and maintain her independence.    Personal Factors and Comorbidities Age;Comorbidity 3+    Comorbidities COPD/Diastolic CHF, HTN, high fall risk, former smoker,    Examination-Activity Limitations Carry;Lift;Locomotion Level;Squat;Stairs;Stand;Transfers    Examination-Participation Restrictions Church;Cleaning;Community Activity;Shop;Volunteer;Laundry;Yard Work    Stability/Clinical Decision Making Stable/Uncomplicated    Rehab Potential Good    PT Frequency 2x / week    PT Duration 12 weeks    PT Treatment/Interventions ADLs/Self Care Home Management;Cryotherapy;Moist Heat;Gait training;Stair training;Functional mobility training;Therapeutic activities;Therapeutic exercise;Balance training;Neuromuscular re-education;Patient/family education;Energy conservation;DME Instruction;Manual techniques;Passive range of motion    PT Next Visit Plan N/a discharge    PT Home Exercise  Plan Access Code: [email or web address]  URL: [email or web address]    Consulted and Agree with Plan of Care Patient             Patient will benefit from skilled therapeutic intervention in order to improve the following deficits and impairments:  Decreased balance, Decreased mobility, Difficulty  walking, Cardiopulmonary status limiting activity, Decreased activity tolerance, Decreased strength, Decreased endurance, Decreased coordination, Hypomobility  Visit Diagnosis: Generalized weakness  Abnormality of gait and mobility  Difficulty in walking, not elsewhere classified     Problem List Patient Active Problem List   Diagnosis Date Noted   Oral lesion 05/17/2021   Sterile pyuria 04/06/2021   Hypotension 04/01/2021   Hyperparathyroidism due to renal insufficiency ([place]) 02/02/2021   CKD (chronic kidney disease) stage 3, GFR 30-59 ml/min (HCC) 01/15/2021   Grief 04/08/2020   History of esophageal stricture 04/08/2020   Leg swelling 03/09/2020   Atypical chest pain 01/10/2020   Anorexia 01/10/2020   B12 deficiency 01/09/2020   Generalized weakness 07/14/2019   Mild malnutrition ([place]) 94/76/5465   Diastolic CHF, chronic ([place]) 03/25/2019   Vitamin D deficiency 03/25/2019   Melena 02/03/2018   S/P excision of skin lesion, follow-up exam 12/26/2017   Impingement syndrome of left shoulder region 07/04/2017   Neck pain 07/04/2017   Allergic rhinitis 12/31/2016   SCC (squamous cell carcinoma), face 08/01/2016   Osteoporosis of forearm 02/13/2016   Dizziness and giddiness 02/13/2016   Insomnia secondary to anxiety 07/12/2015   Anxiety disorder 07/12/2015   Essential (primary) hypertension 10/07/2014   Sinus bradycardia    Hardening of the aorta (main artery of the heart) ([place]) 06/26/2014   History of Clostridium difficile colitis 10/20/2013   Personal history of other diseases of the digestive system 10/20/2013   Low back pain 10/04/2013   Encounter for general adult medical examination without abnormal findings 02/07/2013   Chest pain at rest 12/26/2011   COPD, moderate ([place]) 12/05/2011   Presence of aortocoronary bypass graft    Stage 3b chronic kidney disease ([place]) 09/25/2011   Hyperlipidemia    Hypertension    SOB (shortness of breath)    Coronary artery disease  of native artery of native heart with stable angina pectoris ([place])    Carotid artery disease ([place])    Bradly Chris PT, DPT  06/10/2021, 4:51 PM  [facility] [address], [place], [address] Phone: [phone]   Fax:  [phone]  Name: [name] MRN: [id number] Date of Birth: [birth year]

## 2021-06-18 ENCOUNTER — Ambulatory Visit (INDEPENDENT_AMBULATORY_CARE_PROVIDER_SITE_OTHER): Payer: Medicare Other | Admitting: *Deleted

## 2021-06-18 DIAGNOSIS — J449 Chronic obstructive pulmonary disease, unspecified: Secondary | ICD-10-CM

## 2021-06-18 DIAGNOSIS — I10 Essential (primary) hypertension: Secondary | ICD-10-CM | POA: Diagnosis not present

## 2021-06-18 NOTE — Chronic Care Management (AMB) (Signed)
Chronic Care Management   CCM RN Visit Note  06/18/2021 Name: [name] MRN: [id number] DOB: 06-Nov-1925  Subjective: [name] is a 87 y.o. year old female who is a primary care patient of Tullo, Aris Everts, MD. The care management team was consulted for assistance with disease management and care coordination needs.    Engaged with patient by telephone for follow up visit in response to provider referral for case management and/or care coordination services.   Consent to Services:  The patient was given information about Chronic Care Management services, agreed to services, and gave verbal consent prior to initiation of services.  Please see initial visit note for detailed documentation.   Patient agreed to services and verbal consent obtained.   Assessment: Review of patient past medical history, allergies, medications, health status, including review of consultants reports, laboratory and other test data, was performed as part of comprehensive evaluation and provision of chronic care management services.   SDOH (Social Determinants of Health) assessments and interventions performed:    CCM Care Plan  Allergies  Allergen Reactions   Ciprofloxacin     ? Caused c diff     Outpatient Encounter Medications as of 06/18/2021  Medication Sig Note   Ipratropium-Albuterol (COMBIVENT) 20-100 MCG/ACT AERS respimat Inhale 1 puff into the lungs every 6 (six) hours as needed for wheezing.    atorvastatin (LIPITOR) 40 MG tablet TAKE 1 TABLET BY MOUTH DAILY    clopidogrel (PLAVIX) 75 MG tablet TAKE 1 TABLET BY MOUTH DAILY    Cranberry 125 MG TABS Take 1 tablet by mouth daily.    esomeprazole (NEXIUM) 40 MG capsule Take 40 mg by mouth daily at 12 noon.    furosemide (LASIX) 20 MG tablet Take 1 tablet 1-2 days per week as needed    isosorbide mononitrate (IMDUR) 60 MG 24 hr tablet TAKE 1 AND 1/2 TABLETS BY MOUTH TWO TIMES DAILY    losartan (COZAAR) 100 MG tablet TAKE 1 TABLET BY  MOUTH DAILY    nitroGLYCERIN (NITROSTAT) 0.4 MG SL tablet PLACE 1 TABLET UNDER TONGUE EVERY 5 MIN AS NEEDED FOR CHEST PAIN IF NO RELIEF IN15 MIN CALL 911 (MAX 3 TABS)    Probiotic Product (PROBIOTIC PO) Take 1 capsule by mouth daily.    propranolol (INDERAL) 20 MG tablet TAKE ONE TABLET 3 TIMES DAILY AS NEEDED FOR TACHYCARDIA 03/15/2021: Hasn't needed in long time per patient   ranolazine (RANEXA) 500 MG 12 hr tablet TAKE 1 TABLET BY MOUTH TWICE DAILY    vitamin B-12 (CYANOCOBALAMIN) 500 MCG tablet Take 500 mcg by mouth daily.    No facility-administered encounter medications on file as of 06/18/2021.    Patient Active Problem List   Diagnosis Date Noted   Oral lesion 05/17/2021   Sterile pyuria 04/06/2021   Hypotension 04/01/2021   Hyperparathyroidism due to renal insufficiency ([place]) 02/02/2021   CKD (chronic kidney disease) stage 3, GFR 30-59 ml/min (HCC) 01/15/2021   Grief 04/08/2020   History of esophageal stricture 04/08/2020   Leg swelling 03/09/2020   Atypical chest pain 01/10/2020   Anorexia 01/10/2020   B12 deficiency 01/09/2020   Generalized weakness 07/14/2019   Mild malnutrition ([place]) 02/40/9735   Diastolic CHF, chronic ([place]) 03/25/2019   Vitamin D deficiency 03/25/2019   Melena 02/03/2018   S/P excision of skin lesion, follow-up exam 12/26/2017   Impingement syndrome of left shoulder region 07/04/2017   Neck pain 07/04/2017   Allergic rhinitis 12/31/2016   SCC (squamous cell  carcinoma), face 08/01/2016   Osteoporosis of forearm 02/13/2016   Dizziness and giddiness 02/13/2016   Insomnia secondary to anxiety 07/12/2015   Anxiety disorder 07/12/2015   Essential (primary) hypertension 10/07/2014   Sinus bradycardia    Hardening of the aorta (main artery of the heart) ([place]) 06/26/2014   History of Clostridium difficile colitis 10/20/2013   Personal history of other diseases of the digestive system 10/20/2013   Low back pain 10/04/2013   Encounter for general adult  medical examination without abnormal findings 02/07/2013   Chest pain at rest 12/26/2011   COPD, moderate ([place]) 12/05/2011   Presence of aortocoronary bypass graft    Stage 3b chronic kidney disease ([place]) 09/25/2011   Hyperlipidemia    Hypertension    SOB (shortness of breath)    Coronary artery disease of native artery of native heart with stable angina pectoris ([place])    Carotid artery disease (HCC)     Conditions to be addressed/monitored:HTN and COPD  Care Plan : COPD (Adult)  Updates made by Leona Singleton, RN since 06/18/2021 12:00 AM     Problem: Symptom Exacerbation (COPD)   Priority: Medium     Long-Range Goal: Symptom Exacerbation Prevented or Minimized   Start Date: 03/15/2021  Expected End Date: 09/17/2021  Priority: Medium  Note:   Current Barriers:  Knowledge deficits related to basic understanding of COPD disease process Knowledge deficits related to basic COPD self care/management history of COPD; Denies current shortness of breath.  Uses Combivent rescue inhaler about daily.  Was on Anora, intolerable causing mouth rash/thrush that has turned into dry mouth per patient.  Does report continued occasional lip swelling Case Manager Clinical Goal(s): patient will report using inhalers as prescribed including rinsing mouth after use patient will verbalize understanding of COPD action plan and when to seek appropriate levels of medical care patient will verbalize basic understanding of COPD disease process and self care activities  Interventions:  Collaboration with [name], MD regarding development and update of comprehensive plan of care as evidenced by provider attestation and co-signature Inter-disciplinary care team collaboration (see longitudinal plan of care) Provided patient with basic written and verbal COPD education on self care/management/and exacerbation prevention  Provided patient with COPD action plan and reinforced importance of daily self  assessment Provided instruction about proper use of medications used for management of COPD including inhalers Advised patient to self assesses COPD action plan zone and make appointment with provider if in the yellow zone for 48 hours without improvement. Provided education about and advised patient to utilize infection prevention strategies to reduce risk of respiratory infection  Barriers to lifestyle changes reviewed and addressed Barriers to treatment reviewed and addressed Healthy lifestyle promoted and encouraged to develop a rescue plan Rescue (action) plan reviewed and signs/symptoms of worsening disease assessed; encouraged to follow rescue plan if symptoms flare-up Discussed eliminating symptom triggers at home Encouraged to attend all scheduled provider appointments   Instructed to call provider office for new concerns or questions Reviewed and assessed barriers and manage adherence, including inhaler technique and persistent trigger exposure; encourage adherence, even when symptoms are controlled or infrequent Encouraged to monitor for signs/symptoms of psychosocial concerns, such as shortness of breath-anxiety cycle or depression that may impact stability of symptoms Discussed allergic reaction to previous inhaler, Anora and subsequent diagnosis of dry mouth with provider recommended treatment of sipping water, lemon water and chewing gum; patient encouraged to contact provider for worsening symptoms Patient Goals/Self-Care Activities: : Identify and  avoid  triggers Identify and remove indoor air pollutants Limit outdoor activity during cold weather  Rinse mouth after inhaler use Continue dry mouth treatment with sips of water, hard candy, chewing gum Contact provider if dry mouth gets worse Follow Up Plan: The care management team will reach out to the patient again over the next 45 business days.      Care Plan : Hypertension (Adult)  Updates made by Leona Singleton, RN  since 06/18/2021 12:00 AM     Problem: Hypertension (Hypertension)   Priority: Medium     Long-Range Goal: Hypertension Monitored   Start Date: 03/15/2021  Expected End Date: 09/17/2021  This Visit's Progress: On track  Priority: Medium  Note:   Objective:  Last practice recorded BP readings:  BP Readings from Last 3 Encounters:  05/17/21 130/76  05/04/21 (!) 129/53  04/15/21 (!) 142/53  Current Barriers:  Knowledge Deficits related to basic understanding of hypertension pathophysiology and self care management reports monitoring home blood pressures weekly.  Does not remember the numbers and is not near her log.  Does think they are normal ranges. Case Manager Clinical Goal(s):  patient will verbalize understanding of plan for hypertension management patient will demonstrate improved adherence to prescribed treatment plan for hypertension as evidenced by taking all medications as prescribed, monitoring and recording blood pressure as directed, adhering to low sodium/DASH diet Interventions:  Collaboration with [name], MD regarding development and update of comprehensive plan of care as evidenced by provider attestation and co-signature Inter-disciplinary care team collaboration (see longitudinal plan of care) Evaluation of current treatment plan related to hypertension self management and patient's adherence to plan as established by provider. Provided education to patient re: stroke prevention, s/s of heart attack and stroke, DASH diet, complications of uncontrolled blood pressure Reviewed medications with patient and discussed importance of compliance Discussed plans with patient for ongoing care management follow up and provided patient with direct contact information for care management team Advised patient, providing education and rationale, to monitor blood pressure weekly and record, calling PCP for findings outside established parameters.  Blood pressure trends  reviewed Depression screen reviewed Encouraged continued use of home blood pressure monitoring and recording in blood pressure log; include symptoms of hypotension or potential medication side effects in log  Encouraged to attend all scheduled provider appointments Instructed to call provider office for new concerns, questions, or BP outside discussed parameters Reviewed and instructed to follow a low sodium diet/DASH diet Patient Goals/Self-Care Activities: Check blood pressure weekly Write blood pressure results in a log  Take log to medical appointments for provider review Low salt diet Follow Up Plan: The care management team will reach out to the patient again over the next 45 business days.       Plan:The care management team will reach out to the patient again over the next 45 business days.  Hubert Azure RN, MSN RN Care Management Coordinator [place] [phone] [email or web address]

## 2021-06-18 NOTE — Patient Instructions (Signed)
Visit Information  PATIENT GOALS:  Goals Addressed             This Visit's Progress    (RNCM) Track and Manage My Triggers-COPD   On track    Timeframe:  Long-Range Goal Priority:  Medium Start Date:   03/15/21                          Expected End Date: 09/17/21                      Follow Up Date 07/30/21   Identify and avoid  triggers Identify and remove indoor air pollutants Limit outdoor activity during cold weather  Rinse mouth after inhaler use Continue dry mouth treatment with sips of water, hard candy, chewing gum Contact provider if dry mouth gets worse   Why is this important?   Triggers are activities or things, like tobacco smoke or cold weather, that make your COPD (chronic obstructive pulmonary disease) flare-up.  Knowing these triggers helps you plan how to stay away from them.  When you cannot remove them, you can learn how to manage them.     Notes:      (RNCM)Track and Manage My Blood Pressure-Hypertension   On track    Timeframe:  Long-Range Goal Priority:  Medium Start Date:   03/15/21                          Expected End Date:  09/17/21                     Follow Up Date 07/30/21   Check blood pressure weekly Write blood pressure results in a log  Take log to medical appointments for provider review Low salt diet   Why is this important?   You won't feel high blood pressure, but it can still hurt your blood vessels.  High blood pressure can cause heart or kidney problems. It can also cause a stroke.  Making lifestyle changes like losing a little weight or eating less salt will help.  Checking your blood pressure at home and at different times of the day can help to control blood pressure.  If the doctor prescribes medicine remember to take it the way the doctor ordered.  Call the office if you cannot afford the medicine or if there are questions about it.     Notes:         Patient verbalizes understanding of instructions provided today  and agrees to view in [place].   The care management team will reach out to the patient again over the next 45 business days.   Hubert Azure RN, MSN RN Care Management Coordinator [place] [phone] [email or web address]

## 2021-06-22 ENCOUNTER — Other Ambulatory Visit: Payer: Self-pay

## 2021-06-22 ENCOUNTER — Ambulatory Visit
Admission: RE | Admit: 2021-06-22 | Discharge: 2021-06-22 | Disposition: A | Discharge status: Home / Self Care | Payer: Medicare Other | Source: Ambulatory Visit | Attending: Internal Medicine | Admitting: Internal Medicine

## 2021-06-22 ENCOUNTER — Encounter: Payer: Medicare Other | Admitting: Physical Therapy

## 2021-06-22 DIAGNOSIS — Z1231 Encounter for screening mammogram for malignant neoplasm of breast: Secondary | ICD-10-CM | POA: Insufficient documentation

## 2021-06-23 ENCOUNTER — Ambulatory Visit (INDEPENDENT_AMBULATORY_CARE_PROVIDER_SITE_OTHER): Payer: Medicare Other

## 2021-06-23 ENCOUNTER — Other Ambulatory Visit: Payer: Self-pay

## 2021-06-23 DIAGNOSIS — E538 Deficiency of other specified B group vitamins: Secondary | ICD-10-CM

## 2021-06-23 MED ORDER — CYANOCOBALAMIN 1000 MCG/ML IJ SOLN
1000.0000 ug | Freq: Once | INTRAMUSCULAR | Status: AC
  Administered 2021-06-23: 1000 ug via INTRAMUSCULAR

## 2021-06-23 NOTE — Progress Notes (Signed)
Patient presented for B 12 injection to left deltoid, patient voiced no concerns nor showed any signs of distress during injection. 

## 2021-06-24 ENCOUNTER — Encounter: Payer: Medicare Other | Admitting: Physical Therapy

## 2021-06-29 ENCOUNTER — Encounter: Payer: Medicare Other | Admitting: Physical Therapy

## 2021-07-01 ENCOUNTER — Encounter: Payer: Medicare Other | Admitting: Physical Therapy

## 2021-07-06 ENCOUNTER — Encounter: Payer: Medicare Other | Admitting: Physical Therapy

## 2021-07-07 ENCOUNTER — Other Ambulatory Visit: Payer: Self-pay

## 2021-07-07 ENCOUNTER — Ambulatory Visit (INDEPENDENT_AMBULATORY_CARE_PROVIDER_SITE_OTHER): Payer: Medicare Other | Admitting: Internal Medicine

## 2021-07-07 ENCOUNTER — Encounter: Payer: Self-pay | Admitting: Internal Medicine

## 2021-07-07 VITALS — BP 132/62 | HR 84 | Temp 96.2°F | Ht 62.5 in | Wt 121.0 lb

## 2021-07-07 DIAGNOSIS — I6523 Occlusion and stenosis of bilateral carotid arteries: Secondary | ICD-10-CM

## 2021-07-07 DIAGNOSIS — N1832 Chronic kidney disease, stage 3b: Secondary | ICD-10-CM

## 2021-07-07 DIAGNOSIS — J449 Chronic obstructive pulmonary disease, unspecified: Secondary | ICD-10-CM | POA: Diagnosis not present

## 2021-07-07 DIAGNOSIS — Z8719 Personal history of other diseases of the digestive system: Secondary | ICD-10-CM | POA: Diagnosis not present

## 2021-07-07 DIAGNOSIS — R0602 Shortness of breath: Secondary | ICD-10-CM | POA: Diagnosis not present

## 2021-07-07 DIAGNOSIS — I10 Essential (primary) hypertension: Secondary | ICD-10-CM | POA: Diagnosis not present

## 2021-07-07 DIAGNOSIS — F419 Anxiety disorder, unspecified: Secondary | ICD-10-CM | POA: Diagnosis not present

## 2021-07-07 DIAGNOSIS — I25118 Atherosclerotic heart disease of native coronary artery with other forms of angina pectoris: Secondary | ICD-10-CM

## 2021-07-07 DIAGNOSIS — F5105 Insomnia due to other mental disorder: Secondary | ICD-10-CM | POA: Diagnosis not present

## 2021-07-07 DIAGNOSIS — K5900 Constipation, unspecified: Secondary | ICD-10-CM | POA: Diagnosis not present

## 2021-07-07 MED ORDER — ALPRAZOLAM 0.25 MG PO TABS: 0.2500 mg | ORAL_TABLET | Freq: Every evening | ORAL | 5 refills | Status: DC | PRN

## 2021-07-07 NOTE — Patient Instructions (Signed)
Try taking one of the following bulk forming laxatives every day (apart from medications):  Metamucil (Capsule) Citruel Miralax Benefiber Fibercon (capsule)  You need about  60 ounces of water or non caffeinated liquid daily to keep kidneys and bowels working right  WORK YOUR WAY UP GRADUALLY OVER SEVERAL WEEKS   Only use the dulcolax if you have gone 48 hours without a BM despite taking daily miralax

## 2021-07-07 NOTE — Progress Notes (Signed)
Subjective:  Patient ID: [name], female    DOB: 01-20-1926  Age: 87 y.o. MRN: [id number]  CC: The primary encounter diagnosis was Constipation, unspecified constipation type. Diagnoses of Stage 3b chronic kidney disease ([place]), Primary hypertension, Coronary artery disease of native artery of native heart with stable angina pectoris ([place]), COPD, moderate ([place]), History of pancreatitis, Essential (primary) hypertension, SOB (shortness of breath), and Insomnia secondary to anxiety were also pertinent to this visit.  HPI HEAVENLY CHRISTINE presents for  Chief Complaint  Patient presents with   Follow-up    3 month follow up on hypertension, insomnia and CKD   This visit occurred during the SARS-CoV-2 public health emergency.  Safety protocols were in place, including screening questions prior to the visit, additional usage of staff PPE, and extensive cleaning of exam room while observing appropriate contact time as indicated for disinfecting solutions.   1)Hypertension: patient checks blood pressure twice weekly at home.  Readings have been for the most part <140/80 at rest . Patient is following a reduce salt diet most days and is taking medications as prescribed   2) Insomnia:  this is a chronic problem.   No troubel initiating sleep , usually.  Has trouble going back to sleep after getting up to void.   3) CKD: doesn't want to see a nephrologist given her age. Avoiding nsaids.   4) Constipation:using Dulcolax every other day,  averaging 3 times per week. No straining ,  no blood in stools. No abd pain.    Outpatient Medications Prior to Visit  Medication Sig Dispense Refill   atorvastatin (LIPITOR) 40 MG tablet TAKE 1 TABLET BY MOUTH DAILY 90 tablet 1   clopidogrel (PLAVIX) 75 MG tablet TAKE 1 TABLET BY MOUTH DAILY 90 tablet 0   Cranberry 125 MG TABS Take 1 tablet by mouth daily.     esomeprazole (NEXIUM) 40 MG capsule Take 40 mg by mouth daily at 12 noon.     furosemide  (LASIX) 20 MG tablet Take 1 tablet 1-2 days per week as needed 10 tablet 1   Ipratropium-Albuterol (COMBIVENT) 20-100 MCG/ACT AERS respimat Inhale 1 puff into the lungs every 6 (six) hours as needed for wheezing. 4 g 6   isosorbide mononitrate (IMDUR) 60 MG 24 hr tablet TAKE 1 AND 1/2 TABLETS BY MOUTH TWO TIMES DAILY 270 tablet 3   losartan (COZAAR) 100 MG tablet TAKE 1 TABLET BY MOUTH DAILY 90 tablet 0   nitroGLYCERIN (NITROSTAT) 0.4 MG SL tablet PLACE 1 TABLET UNDER TONGUE EVERY 5 MIN AS NEEDED FOR CHEST PAIN IF NO RELIEF IN15 MIN CALL 911 (MAX 3 TABS) 25 tablet 3   Probiotic Product (PROBIOTIC PO) Take 1 capsule by mouth daily.     propranolol (INDERAL) 20 MG tablet TAKE ONE TABLET 3 TIMES DAILY AS NEEDED FOR TACHYCARDIA 270 tablet 3   ranolazine (RANEXA) 500 MG 12 hr tablet TAKE 1 TABLET BY MOUTH TWICE DAILY 180 tablet 0   vitamin B-12 (CYANOCOBALAMIN) 500 MCG tablet Take 500 mcg by mouth daily.     ALPRAZolam (XANAX) 0.25 MG tablet Take 0.25 mg by mouth at bedtime as needed.     No facility-administered medications prior to visit.    Review of Systems;  Patient denies headache, fevers, malaise, unintentional weight loss, skin rash, eye pain, sinus congestion and sinus pain, sore throat, dysphagia,  hemoptysis , cough, dyspnea, wheezing, chest pain, palpitations, orthopnea, edema, abdominal pain, nausea, melena, diarrhea, constipation, flank pain, dysuria, hematuria, urinary  Frequency, nocturia, numbness, tingling, seizures,  Focal weakness, Loss of consciousness,  Tremor, insomnia, depression, anxiety, and suicidal ideation.      Objective:  BP 132/62 (BP Location: Left Arm, Patient Position: Sitting, Cuff Size: Normal)   Pulse 84   Temp (!) 96.2 F (35.7 C) (Temporal)   Ht 5' 2.5" (1.588 m)   Wt 121 lb (54.9 kg)   SpO2 94%   BMI 21.78 kg/m   BP Readings from Last 3 Encounters:  07/07/21 132/62  05/17/21 130/76  05/04/21 (!) 129/53    Wt Readings from Last 3 Encounters:   07/07/21 121 lb (54.9 kg)  05/17/21 119 lb 3.2 oz (54.1 kg)  04/12/21 123 lb (55.8 kg)    General appearance: alert, cooperative and appears stated age Ears: normal TM's and external ear canals both ears Throat: lips, mucosa, and tongue normal; teeth and gums normal Neck: no adenopathy, no carotid bruit, supple, symmetrical, trachea midline and thyroid not enlarged, symmetric, no tenderness/mass/nodules Back: symmetric, no curvature. ROM normal. No CVA tenderness. Lungs: clear to auscultation bilaterally Heart: regular rate and rhythm, S1, S2 normal, no murmur, click, rub or gallop Abdomen: soft, non-tender; bowel sounds normal; no masses,  no organomegaly Pulses: 2+ and symmetric Skin: Skin color, texture, turgor normal. No rashes or lesions Lymph nodes: Cervical, supraclavicular, and axillary nodes normal.  Lab Results  Component Value Date   HGBA1C 5.8 01/30/2017   HGBA1C 5.8 09/26/2016    Lab Results  Component Value Date   CREATININE 1.42 (H) 07/07/2021   CREATININE 1.45 (H) 04/01/2021   CREATININE 1.42 (H) 01/25/2021    Lab Results  Component Value Date   WBC 6.8 07/07/2021   HGB 12.4 07/07/2021   HCT 37.2 07/07/2021   PLT 191.0 07/07/2021   GLUCOSE 112 (H) 07/07/2021   CHOL 128 04/02/2021   TRIG 108 04/02/2021   HDL 48 04/02/2021   LDLDIRECT 72.0 11/09/2015   LDLCALC 58 04/02/2021   ALT 11 07/07/2021   AST 14 07/07/2021   NA 141 07/07/2021   K 4.7 07/07/2021   CL 103 07/07/2021   CREATININE 1.42 (H) 07/07/2021   BUN 19 07/07/2021   CO2 29 07/07/2021   TSH 2.47 07/07/2021   INR 0.9 01/31/2018   HGBA1C 5.8 01/30/2017   MICROALBUR 2.5 (H) 12/26/2011    MM 3D SCREEN BREAST BILATERAL  Result Date: 06/26/2021 CLINICAL DATA:  Screening. EXAM: DIGITAL SCREENING BILATERAL MAMMOGRAM WITH TOMOSYNTHESIS AND CAD TECHNIQUE: Bilateral screening digital craniocaudal and mediolateral oblique mammograms were obtained. Bilateral screening digital breast tomosynthesis  was performed. The images were evaluated with computer-aided detection. COMPARISON:  Previous exam(s). ACR Breast Density Category c: The breast tissue is heterogeneously dense, which may obscure small masses. FINDINGS: There are no findings suspicious for malignancy. IMPRESSION: No mammographic evidence of malignancy. A result letter of this screening mammogram will be mailed directly to the patient. RECOMMENDATION: Screening mammogram in one year. (Code:SM-B-01Y) BI-RADS CATEGORY  1: Negative. Electronically Signed   By: Ammie Ferrier M.D.   On: 06/26/2021 07:32   Assessment & Plan:   Problem List Items Addressed This Visit     SOB (shortness of breath)    Likely multifactorial with CAD and COPD contributing.  Currently asymptomatic      Coronary artery disease of native artery of native heart with stable angina pectoris ([place])    She denies recurrent exertional chest pain on current regimen of Imdur and Ranexa       Stage 3b chronic kidney  disease ([place])    Diagnosis and management reviewed with patient.  Nephrology referral declined.  She is avoiding NSAIDs      Relevant Orders   Comprehensive metabolic panel (Completed)   CBC with Differential/Platelet (Completed)   COPD, moderate (HCC)    Currently stable ,  No symptoms at rest or with walking .       History of pancreatitis   Essential (primary) hypertension    No recurrent hypotension on current regimen  No changes today       Insomnia secondary to anxiety    Stopping lorazepam and prescribign prn use of alprazolam for early wakenings The risks and benefits of benzodiazepine use were discussed with patient today including excessive sedation leading to respiratory depression,  impaired thinking/driving, and addiction.  Patient was advised to avoid concurrent use with alcohol, to use medication only as needed and not to share with others  .       Relevant Medications   ALPRAZolam (XANAX) 0.25 MG tablet   Other Visit  Diagnoses     Constipation, unspecified constipation type    -  Primary   Relevant Orders   Magnesium (Completed)   TSH (Completed)   Primary hypertension           I have changed Shaniqua B. Fernholz's ALPRAZolam. I am also having her maintain her Ipratropium-Albuterol, esomeprazole, propranolol, Probiotic Product (PROBIOTIC PO), vitamin B-12, Cranberry, nitroGLYCERIN, furosemide, atorvastatin, isosorbide mononitrate, ranolazine, losartan, and clopidogrel.  Meds ordered this encounter  Medications   ALPRAZolam (XANAX) 0.25 MG tablet    Sig: Take 1 tablet (0.25 mg total) by mouth at bedtime as needed.    Dispense:  30 tablet    Refill:  5    Medications Discontinued During This Encounter  Medication Reason   ALPRAZolam (XANAX) 0.25 MG tablet Reorder    Follow-up: No follow-ups on file.   [name], MD

## 2021-07-08 ENCOUNTER — Encounter: Payer: Medicare Other | Admitting: Physical Therapy

## 2021-07-08 LAB — CBC WITH DIFFERENTIAL/PLATELET
Basophils Absolute: 0.1 10*3/uL (ref 0.0–0.1)
Basophils Relative: 1.4 % (ref 0.0–3.0)
Eosinophils Absolute: 0.1 10*3/uL (ref 0.0–0.7)
Eosinophils Relative: 1.4 % (ref 0.0–5.0)
HCT: 37.2 % (ref 36.0–46.0)
Hemoglobin: 12.4 g/dL (ref 12.0–15.0)
Lymphocytes Relative: 16.9 % (ref 12.0–46.0)
Lymphs Abs: 1.2 10*3/uL (ref 0.7–4.0)
MCHC: 33.2 g/dL (ref 30.0–36.0)
MCV: 103.7 fl — ABNORMAL HIGH (ref 78.0–100.0)
Monocytes Absolute: 0.7 10*3/uL (ref 0.1–1.0)
Monocytes Relative: 10.7 % (ref 3.0–12.0)
Neutro Abs: 4.8 10*3/uL (ref 1.4–7.7)
Neutrophils Relative %: 69.6 % (ref 43.0–77.0)
Platelets: 191 10*3/uL (ref 150.0–400.0)
RBC: 3.58 Mil/uL — ABNORMAL LOW (ref 3.87–5.11)
RDW: 12.9 % (ref 11.5–15.5)
WBC: 6.8 10*3/uL (ref 4.0–10.5)

## 2021-07-08 LAB — COMPREHENSIVE METABOLIC PANEL
ALT: 11 U/L (ref 0–35)
AST: 14 U/L (ref 0–37)
Albumin: 4.1 g/dL (ref 3.5–5.2)
Alkaline Phosphatase: 61 U/L (ref 39–117)
BUN: 19 mg/dL (ref 6–23)
CO2: 29 mEq/L (ref 19–32)
Calcium: 8.9 mg/dL (ref 8.4–10.5)
Chloride: 103 mEq/L (ref 96–112)
Creatinine, Ser: 1.42 mg/dL — ABNORMAL HIGH (ref 0.40–1.20)
GFR: 32.02 mL/min — ABNORMAL LOW (ref >60.00)
Glucose, Bld: 112 mg/dL — ABNORMAL HIGH (ref 70–99)
Potassium: 4.7 mEq/L (ref 3.5–5.1)
Sodium: 141 mEq/L (ref 135–145)
Total Bilirubin: 0.7 mg/dL (ref 0.2–1.2)
Total Protein: 5.9 g/dL — ABNORMAL LOW (ref 6.0–8.3)

## 2021-07-08 LAB — MAGNESIUM: Magnesium: 2.1 mg/dL (ref 1.5–2.5)

## 2021-07-08 LAB — TSH: TSH: 2.47 u[IU]/mL (ref 0.35–5.50)

## 2021-07-10 NOTE — Assessment & Plan Note (Signed)
No recurrent hypotension on current regimen  No changes today

## 2021-07-10 NOTE — Assessment & Plan Note (Signed)
Stopping lorazepam and prescribign prn use of alprazolam for early wakenings The risks and benefits of benzodiazepine use were discussed with patient today including excessive sedation leading to respiratory depression,  impaired thinking/driving, and addiction.  Patient was advised to avoid concurrent use with alcohol, to use medication only as needed and not to share with others  .

## 2021-07-10 NOTE — Assessment & Plan Note (Signed)
Diagnosis and management reviewed with patient.   Nephrology referral declined.  She is avoiding NSAIDs °

## 2021-07-10 NOTE — Assessment & Plan Note (Signed)
Likely multifactorial with CAD and COPD contributing.  Currently asymptomatic

## 2021-07-10 NOTE — Assessment & Plan Note (Signed)
She denies recurrent exertional chest pain on current regimen of Imdur and Ranexa

## 2021-07-10 NOTE — Assessment & Plan Note (Signed)
Well controlled on current regimen. Renal function stable, no changes today. Continue Imdur , losartan and propranolol

## 2021-07-10 NOTE — Assessment & Plan Note (Signed)
Currently stable ,  No symptoms at rest or with walking .

## 2021-07-12 ENCOUNTER — Other Ambulatory Visit: Payer: Self-pay | Admitting: Cardiovascular Disease

## 2021-07-13 ENCOUNTER — Encounter: Payer: Medicare Other | Admitting: Physical Therapy

## 2021-07-15 ENCOUNTER — Encounter: Payer: Medicare Other | Admitting: Physical Therapy

## 2021-07-21 DIAGNOSIS — H353221 Exudative age-related macular degeneration, left eye, with active choroidal neovascularization: Secondary | ICD-10-CM | POA: Diagnosis not present

## 2021-07-26 ENCOUNTER — Other Ambulatory Visit: Payer: Self-pay | Admitting: Cardiovascular Disease

## 2021-07-30 ENCOUNTER — Telehealth: Payer: Self-pay | Admitting: *Deleted

## 2021-07-30 ENCOUNTER — Telehealth: Payer: Medicare Other

## 2021-07-30 NOTE — Telephone Encounter (Signed)
  Care Management   Follow Up Note   07/30/2021 Name: CARLOTTA TELFAIR MRN: [id number] DOB: Jul 15, 1926   Referred by: [name], MD Reason for referral : Chronic Care Management (COPD, HTN)   An unsuccessful telephone outreach was attempted today. The patient was referred to the case management team for assistance with care management and care coordination.   Follow Up Plan: RNCM will seek assistance from [facility] Gu[facility] in rescheduling appointment within the next 30 days.  Hubert Azure RN, MSN RN Care Management Coordinator [place] [phone] [email or web address]

## 2021-08-04 ENCOUNTER — Telehealth: Payer: Self-pay

## 2021-08-04 NOTE — Chronic Care Management (AMB) (Signed)
  Care Management   Note  08/04/2021 Name: [name] MRN: [id number] DOB: Feb 10, 1926  [name] is a 87 y.o. year old female who is a primary care patient of Derrel Nip, Aris Everts, MD and is actively engaged with the care management team. I reached out to [name] by phone today to assist with re-scheduling a follow up visit with the RN Case Manager  Follow up plan: Unsuccessful telephone outreach attempt made. A HIPAA compliant phone message was left for the patient providing contact information and requesting a return call.  The care management team will reach out to the patient again over the next 7 days.  If patient returns call to provider office, please advise to call [facility]  at [place], [place], [place], [place] [address] Direct Dial: [phone] [email or web address] Website: [email or web address]

## 2021-08-11 DIAGNOSIS — Z23 Encounter for immunization: Secondary | ICD-10-CM | POA: Diagnosis not present

## 2021-08-24 ENCOUNTER — Other Ambulatory Visit: Payer: Self-pay | Admitting: Cardiovascular Disease

## 2021-08-25 NOTE — Chronic Care Management (AMB) (Signed)
  Care Management   Note  08/25/2021 Name: [name] MRN: [id number] DOB: [birth year]  [name] is a 87 y.o. year old female who is a primary care patient of Derrel Nip, Aris Everts, MD and is actively engaged with the care management team. I reached out to [name] by phone today to assist with re-scheduling a follow up visit with the RN Case Manager  Follow up plan: Telephone appointment with care management team member scheduled for:09/15/2021  Noreene Larsson, [place], [place] [facility]  [place], [place] [address] Direct Dial: [phone] [email or web address] Website: [email or web address]

## 2021-08-25 NOTE — Telephone Encounter (Signed)
Pt has been r/s  

## 2021-09-15 ENCOUNTER — Ambulatory Visit (INDEPENDENT_AMBULATORY_CARE_PROVIDER_SITE_OTHER): Payer: Medicare Other | Admitting: *Deleted

## 2021-09-15 DIAGNOSIS — J449 Chronic obstructive pulmonary disease, unspecified: Secondary | ICD-10-CM

## 2021-09-15 DIAGNOSIS — I10 Essential (primary) hypertension: Secondary | ICD-10-CM

## 2021-09-15 NOTE — Chronic Care Management (AMB) (Signed)
Chronic Care Management   CCM RN Visit Note  09/15/2021 Name: [name] MRN: [id number] DOB: [birth year]  Subjective: [name] is a 87 y.o. year old female who is a primary care patient of [place], Aris Everts, MD. The care management team was consulted for assistance with disease management and care coordination needs.    Engaged with patient by telephone for follow up visit in response to provider referral for case management and/or care coordination services.   Consent to Services:  The patient was given information about Chronic Care Management services, agreed to services, and gave verbal consent prior to initiation of services.  Please see initial visit note for detailed documentation.   Patient agreed to services and verbal consent obtained.   Assessment: Review of patient past medical history, allergies, medications, health status, including review of consultants reports, laboratory and other test data, was performed as part of comprehensive evaluation and provision of chronic care management services.   SDOH (Social Determinants of Health) assessments and interventions performed:    CCM Care Plan  Allergies  Allergen Reactions   Ciprofloxacin     ? Caused c diff     Outpatient Encounter Medications as of 09/15/2021  Medication Sig Note   ALPRAZolam (XANAX) 0.25 MG tablet Take 1 tablet (0.25 mg total) by mouth at bedtime as needed.    atorvastatin (LIPITOR) 40 MG tablet TAKE 1 TABLET BY MOUTH DAILY    clopidogrel (PLAVIX) 75 MG tablet TAKE 1 TABLET BY MOUTH DAILY    Cranberry 125 MG TABS Take 1 tablet by mouth daily.    esomeprazole (NEXIUM) 40 MG capsule Take 40 mg by mouth daily at 12 noon.    furosemide (LASIX) 20 MG tablet TAKE 1 TABLET BY MOUTH 1-2 DAYS PER WEEKAS NEEDED    Ipratropium-Albuterol (COMBIVENT) 20-100 MCG/ACT AERS respimat Inhale 1 puff into the lungs every 6 (six) hours as needed for wheezing.    isosorbide mononitrate (IMDUR) 60 MG 24 hr  tablet TAKE 1 AND 1/2 TABLETS BY MOUTH TWO TIMES DAILY    losartan (COZAAR) 100 MG tablet TAKE 1 TABLET BY MOUTH DAILY    nitroGLYCERIN (NITROSTAT) 0.4 MG SL tablet PLACE 1 TABLET UNDER TONGUE EVERY 5 MIN AS NEEDED FOR CHEST PAIN IF NO RELIEF IN15 MIN CALL 911 (MAX 3 TABS)    Probiotic Product (PROBIOTIC PO) Take 1 capsule by mouth daily.    propranolol (INDERAL) 20 MG tablet TAKE ONE TABLET 3 TIMES DAILY AS NEEDED FOR TACHYCARDIA 03/15/2021: Hasn't needed in long time per patient   ranolazine (RANEXA) 500 MG 12 hr tablet TAKE 1 TABLET BY MOUTH TWICE DAILY    vitamin B-12 (CYANOCOBALAMIN) 500 MCG tablet Take 500 mcg by mouth daily.    No facility-administered encounter medications on file as of 09/15/2021.    Patient Active Problem List   Diagnosis Date Noted   Oral lesion 05/17/2021   Sterile pyuria 04/06/2021   Hypotension 04/01/2021   Hyperparathyroidism due to renal insufficiency ([facility]) 02/02/2021   CKD (chronic kidney disease) stage 3, GFR 30-59 ml/min (HCC) 01/15/2021   Grief 04/08/2020   History of esophageal stricture 04/08/2020   Leg swelling 03/09/2020   Atypical chest pain 01/10/2020   Anorexia 01/10/2020   B12 deficiency 01/09/2020   Generalized weakness 07/14/2019   Mild malnutrition ([facility]) 40/98/1191   Diastolic CHF, chronic ([facility]) 03/25/2019   Vitamin D deficiency 03/25/2019   Melena 02/03/2018   S/P excision of skin lesion, follow-up exam 12/26/2017   Impingement  syndrome of left shoulder region 07/04/2017   Neck pain 07/04/2017   Allergic rhinitis 12/31/2016   SCC (squamous cell carcinoma), face 08/01/2016   Osteoporosis of forearm 02/13/2016   Dizziness and giddiness 02/13/2016   Insomnia secondary to anxiety 07/12/2015   Anxiety disorder 07/12/2015   Essential (primary) hypertension 10/07/2014   Sinus bradycardia    Hardening of the aorta (main artery of the heart) ([facility]) 06/26/2014   History of Clostridium difficile colitis 10/20/2013   History of pancreatitis  10/20/2013   Low back pain 10/04/2013   Encounter for general adult medical examination without abnormal findings 02/07/2013   Chest pain at rest 12/26/2011   COPD, moderate ([facility]) 12/05/2011   Presence of aortocoronary bypass graft    Stage 3b chronic kidney disease ([facility]) 09/25/2011   Hyperlipidemia    SOB (shortness of breath)    Coronary artery disease of native artery of native heart with stable angina pectoris ([facility])    Carotid artery disease ([facility])     Conditions to be addressed/monitored:HTN and COPD  Care Plan : COPD (Adult)  Updates made by Leona Singleton, RN since 09/15/2021 12:00 AM     Problem: Symptom Exacerbation (COPD/HTN)   Priority: Medium     Long-Range Goal: Symptom Exacerbation Prevented or Minimized  (COPD/HTN)   Start Date: 03/15/2021  Expected End Date: 03/15/2022  Priority: Medium  Note:   Current Barriers:  Knowledge deficits related to basic understanding of COPD disease process Knowledge deficits related to basic COPD self care/management history of COPD; Denies current shortness of breath.  Unsure of last time using the combivent inhaler.  Reports shortness of breath typically when going up/down stair.  Reports taking blood pressure about once a week.  Does not remember exact numbers but states it has been with in range.  Does report fall at home caring too many groceries from garage.  Did have an abrasion and black eye; did not seek medical attention. Case Manager Clinical Goal(s): patient will report using inhalers as prescribed including rinsing mouth after use patient will verbalize understanding of COPD action plan and when to seek appropriate levels of medical care patient will verbalize basic understanding of COPD disease process and self care activities   COPD: (Status: Goal on Track (progressing): YES.) Long Term Goal  Interventions:  Collaboration with [name], MD regarding development and update of comprehensive plan of care as  evidenced by provider attestation and co-signature Inter-disciplinary care team collaboration (see longitudinal plan of care) Provided patient with basic written and verbal COPD education on self care/management/and exacerbation prevention  Provided patient with COPD action plan and reinforced importance of daily self assessment Provided instruction about proper use of medications used for management of COPD including inhalers Advised patient to self assesses COPD action plan zone and make appointment with provider if in the yellow zone for 48 hours without improvement. Provided education about and advised patient to utilize infection prevention strategies to reduce risk of respiratory infection  Healthy lifestyle promoted and encouraged to develop a rescue plan Rescue (action) plan reviewed and signs/symptoms of worsening disease assessed; encouraged to follow rescue plan if symptoms flare-up Discussed eliminating symptom triggers at home Encouraged to attend all scheduled provider appointments   Instructed to call provider office for new concerns or questions Reviewed and assessed barriers and manage adherence, including inhaler technique and persistent trigger exposure; encourage adherence, even when symptoms are controlled or infrequent Encouraged to monitor for signs/symptoms of psychosocial concerns, such as shortness of  breath-anxiety cycle or depression that may impact stability of symptoms  Hypertension: (Status: Goal on Track (progressing): YES.) Long Term Goal  Last practice recorded BP readings:  BP Readings from Last 3 Encounters:  07/07/21 132/62  05/17/21 130/76  05/04/21 (!) 129/53  Most recent eGFR/CrCl: No results found for: EGFR  No components found for: CRCL  Evaluation of current treatment plan related to hypertension self management and patient's adherence to plan as established by provider;   Provided education to patient re: stroke prevention, s/s of heart attack and  stroke; Reviewed prescribed diet low salt Reviewed medications with patient and discussed importance of compliance;  Discussed plans with patient for ongoing care management follow up and provided patient with direct contact information for care management team; Advised patient, providing education and rationale, to monitor blood pressure daily and record, calling PCP for findings outside established parameters;  Discussed complications of poorly controlled blood pressure such as heart disease, stroke, circulatory complications, vision complications, kidney impairment, sexual dysfunction;   Patient Goals/Self-Care Activities: : Identify and avoid  triggers Identify and remove indoor air pollutants Limit outdoor activity during cold weather  Rinse mouth after inhaler use Continue dry mouth treatment with sips of water, hard candy, chewing gum eck blood pressure weekly Write blood pressure results in a log  Take log to medical appointments for provider review Low salt diet Follow Up Plan: The care management team will reach out to the patient again over the next 45 business days.      Care Plan : Hypertension (Adult)  Updates made by Leona Singleton, RN since 09/15/2021 12:00 AM  Completed 09/15/2021   Problem: Hypertension (Hypertension) Resolved 09/15/2021  Priority: Medium  Note:   RESOLVING DUE TO DUPLICATE GOALS    Long-Range Goal: Hypertension Monitored Completed 09/15/2021  Start Date: 03/15/2021  Expected End Date: 09/17/2021  This Visit's Progress: On track  Recent Progress: On track  Priority: Medium  Note:   RESOLVING DUE TO DUPLICATE GOALS  Objective:  Last practice recorded BP readings:  BP Readings from Last 3 Encounters:  05/17/21 130/76  05/04/21 (!) 129/53  04/15/21 (!) 142/53  Current Barriers:  Knowledge Deficits related to basic understanding of hypertension pathophysiology and self care management reports monitoring home blood pressures weekly.  Does  not remember the numbers and is not near her log.  Does think they are normal ranges. Case Manager Clinical Goal(s):  patient will verbalize understanding of plan for hypertension management patient will demonstrate improved adherence to prescribed treatment plan for hypertension as evidenced by taking all medications as prescribed, monitoring and recording blood pressure as directed, adhering to low sodium/DASH diet Interventions:  Collaboration with [name], MD regarding development and update of comprehensive plan of care as evidenced by provider attestation and co-signature Inter-disciplinary care team collaboration (see longitudinal plan of care) Evaluation of current treatment plan related to hypertension self management and patient's adherence to plan as established by provider. Provided education to patient re: stroke prevention, s/s of heart attack and stroke, DASH diet, complications of uncontrolled blood pressure Reviewed medications with patient and discussed importance of compliance Discussed plans with patient for ongoing care management follow up and provided patient with direct contact information for care management team Advised patient, providing education and rationale, to monitor blood pressure weekly and record, calling PCP for findings outside established parameters.  Blood pressure trends reviewed Depression screen reviewed Encouraged continued use of home blood pressure monitoring and recording in blood pressure log; include  symptoms of hypotension or potential medication side effects in log  Encouraged to attend all scheduled provider appointments Instructed to call provider office for new concerns, questions, or BP outside discussed parameters Reviewed and instructed to follow a low sodium diet/DASH diet Patient Goals/Self-Care Activities: Check blood pressure weekly Write blood pressure results in a log  Take log to medical appointments for provider review Low  salt diet Follow Up Plan: The care management team will reach out to the patient again over the next 45 business days.       Plan:The care management team will reach out to the patient again over the next 45 days.  Hubert Azure RN, MSN RN Care Management Coordinator [facility] [phone] [email or web address]

## 2021-09-15 NOTE — Patient Instructions (Signed)
Visit Information  Thank you for taking time to visit with me today. Please don't hesitate to contact me if I can be of assistance to you before our next scheduled telephone appointment.  Following are the goals we discussed today:  (Copy and paste patient goals from clinical care plan here)  Our next appointment is by telephone on 2/8 at 1100  Please call the care guide team at [phone] if you need to cancel or reschedule your appointment.   If you are experiencing a Mental Health or [place] or need someone to talk to, please call the Suicide and Crisis Lifeline: 988 call the [place] National Suicide Prevention Lifeline: 601-725-6598 or TTY: 941-309-6413 TTY 510-177-5189) to talk to a trained counselor call 1-800-273-TALK (toll free, 24 hour hotline) call 911   Patient verbalizes understanding of instructions provided today and agrees to view in [facility].   Hubert Azure RN, MSN RN Care Management Coordinator [place] [phone] [email or web address]

## 2021-09-18 DIAGNOSIS — J449 Chronic obstructive pulmonary disease, unspecified: Secondary | ICD-10-CM

## 2021-09-18 DIAGNOSIS — I10 Essential (primary) hypertension: Secondary | ICD-10-CM | POA: Diagnosis not present

## 2021-09-22 DIAGNOSIS — H6123 Impacted cerumen, bilateral: Secondary | ICD-10-CM | POA: Diagnosis not present

## 2021-09-27 ENCOUNTER — Telehealth: Payer: Self-pay | Admitting: Cardiovascular Disease

## 2021-09-27 NOTE — Telephone Encounter (Addendum)
Was able to reach back out to [name] to f/u on Norvasc, pt reports has not been taking, but say an old med noted so wanted to make sure she was not suppose to be taking. Advised  Per Blanche East PA-C consult notes on 04/02/2021 -It appears amlodipine has been held by primary care, possibly secondary to lower extremity edema  [name] very thankful for the return call and clarification.

## 2021-09-27 NOTE — Telephone Encounter (Signed)
New Message:    Patient wants t o know if she is supposed to be taking Norvasc? She says she have not been taking it.

## 2021-09-29 ENCOUNTER — Other Ambulatory Visit: Payer: Self-pay | Admitting: Cardiovascular Disease

## 2021-09-30 ENCOUNTER — Other Ambulatory Visit: Payer: Self-pay | Admitting: Cardiovascular Disease

## 2021-10-04 ENCOUNTER — Telehealth: Payer: Self-pay | Admitting: Cardiovascular Disease

## 2021-10-04 ENCOUNTER — Telehealth: Payer: Self-pay | Admitting: Internal Medicine

## 2021-10-04 NOTE — Telephone Encounter (Signed)
Spoke with patient and advised her to call PCP for refill of Combivent inhaler. Patient expressed verbal understanding and gratitude for the call.

## 2021-10-04 NOTE — Telephone Encounter (Signed)
Per review of medication record, it appears that she should still have a refill of alprazolam at the pharmacy.  Please call pharmacy and explain to them she has lost her medication and she needs a refill.

## 2021-10-04 NOTE — Telephone Encounter (Signed)
°*  STAT* If patient is at the pharmacy, call can be transferred to refill team.   1. Which medications need to be refilled? (please list name of each medication and dose if known) combivent   2. Which pharmacy/location (including street and city if local pharmacy) is medication to be sent to?[facility]   3. Do they need a 30 day or 90 day supply? 90  Patient aware this is not a cardiac medication and provider may decline.  Patient encouraged to reach out to pcp office while waiting on response.

## 2021-10-04 NOTE — Telephone Encounter (Signed)
Patient was away for the weekend and now she can not find her ALPRAZolam (XANAX) 0.25 MG tablet. Patient was wondering if another one can be called in since she lost it.

## 2021-10-05 NOTE — Telephone Encounter (Signed)
I called the pharmacy and they will refill the medication and I called and informed the patient and she understood.  Faelynn Wynder,cma

## 2021-10-11 ENCOUNTER — Ambulatory Visit (INDEPENDENT_AMBULATORY_CARE_PROVIDER_SITE_OTHER): Payer: Medicare Other | Admitting: Internal Medicine

## 2021-10-11 ENCOUNTER — Encounter: Payer: Self-pay | Admitting: Internal Medicine

## 2021-10-11 ENCOUNTER — Other Ambulatory Visit: Payer: Self-pay

## 2021-10-11 VITALS — BP 125/80 | HR 79 | Temp 97.7°F | Ht 63.0 in | Wt 120.4 lb

## 2021-10-11 DIAGNOSIS — N1832 Chronic kidney disease, stage 3b: Secondary | ICD-10-CM | POA: Diagnosis not present

## 2021-10-11 DIAGNOSIS — F5105 Insomnia due to other mental disorder: Secondary | ICD-10-CM

## 2021-10-11 DIAGNOSIS — E782 Mixed hyperlipidemia: Secondary | ICD-10-CM

## 2021-10-11 DIAGNOSIS — I5032 Chronic diastolic (congestive) heart failure: Secondary | ICD-10-CM

## 2021-10-11 DIAGNOSIS — N2581 Secondary hyperparathyroidism of renal origin: Secondary | ICD-10-CM | POA: Diagnosis not present

## 2021-10-11 DIAGNOSIS — Z7189 Other specified counseling: Secondary | ICD-10-CM

## 2021-10-11 DIAGNOSIS — I25118 Atherosclerotic heart disease of native coronary artery with other forms of angina pectoris: Secondary | ICD-10-CM

## 2021-10-11 DIAGNOSIS — J449 Chronic obstructive pulmonary disease, unspecified: Secondary | ICD-10-CM | POA: Diagnosis not present

## 2021-10-11 DIAGNOSIS — E441 Mild protein-calorie malnutrition: Secondary | ICD-10-CM

## 2021-10-11 DIAGNOSIS — F419 Anxiety disorder, unspecified: Secondary | ICD-10-CM | POA: Diagnosis not present

## 2021-10-11 DIAGNOSIS — G3184 Mild cognitive impairment, so stated: Secondary | ICD-10-CM

## 2021-10-11 MED ORDER — COMBIVENT RESPIMAT 20-100 MCG/ACT IN AERS: INHALATION_SPRAY | RESPIRATORY_TRACT | 5 refills | Status: DC

## 2021-10-11 NOTE — Progress Notes (Signed)
Subjective:  Patient ID: [name], female    DOB: 03-30-1926  Age: 88 y.o. MRN: [id number]  CC: The primary encounter diagnosis was Do not resuscitate discussion. Diagnoses of Mixed hyperlipidemia, Stage 3b chronic kidney disease ([place]), Insomnia secondary to anxiety, Mild malnutrition ([place]), Diastolic CHF, chronic ([place]), Mild cognitive impairment with memory loss, Hyperparathyroidism due to renal insufficiency ([place]), COPD, moderate ([place]), and Coronary artery disease of native artery of native heart with stable angina pectoris [facility]) were also pertinent to this visit.   This visit occurred during the SARS-CoV-2 public health emergency.  Safety protocols were in place, including screening questions prior to the visit, additional usage of staff PPE, and extensive cleaning of exam room while observing appropriate contact time as indicated for disinfecting solutions.    HPI [name] presents for  Chief Complaint  Patient presents with   Follow-up    3 mo   [name] is a delightful 88 yr old female with a history of COPD secondary to tobacco abuse ,  CAD s/p CABG,  stage 3b CKD and hypertension  who presents for follow up on chronic issues.  She reports feeling well and in good spirits having recently visited her son in [place]  who lives on a yacht. Following this trip, she travelled to [place], [place]  to visit with her grandchildren plus 5 great grandchildren.  She was accompanied by her daughter [name].  She is alone today   1) weight loss of 6 lbs over the past year .  Her appetite is fair.  She denies post prandial abdominal pain ,  changes in bowel habits, and early satiety.   2) COPD: using combivent as  needed.  She uses no inhaled therapy on a daily basis by choice,  not even weekly. She states that she is not short of breath with mild activity but no longer golfs due to age. She was very active during her two trips and reports no episodes of weakness,  excessive  fatigue dyspnea,  or imbalance/falls.   3) CKD stage 3b:  she does not recall this diagnosis until recently when she was seen by another provider who recommended a nephrology referral.  She wonders if this is necessary.  We discussed the natural history of CKD,  her stable GFR and her EOL plans (see below)  4) Aortic atherosclerosis:  we reviewed findings of prior CT scan today..  Patient is tolerating high potency statin therapy    5) insomnia: she has been  managing her insomnia with low dose alprazolam taken at 10 pm.  She often wakes up  4 hours later and has difficulty returning to sleep.    6) EOL discussion:  she  has a healthcare POA and a living will on file.  She desires a DNR order, which was discussed in full and signed.   Outpatient Medications Prior to Visit  Medication Sig Dispense Refill   ALPRAZolam (XANAX) 0.25 MG tablet Take 1 tablet (0.25 mg total) by mouth at bedtime as needed. 30 tablet 5   atorvastatin (LIPITOR) 40 MG tablet TAKE 1 TABLET BY MOUTH DAILY 90 tablet 1   clopidogrel (PLAVIX) 75 MG tablet TAKE 1 TABLET BY MOUTH DAILY 90 tablet 0   Cranberry 125 MG TABS Take 1 tablet by mouth daily.     esomeprazole (NEXIUM) 40 MG capsule Take 40 mg by mouth daily at 12 noon.     furosemide (LASIX) 20 MG tablet TAKE 1 TABLET BY MOUTH  1-2 DAYS PER WEEKAS NEEDED 10 tablet 0   isosorbide mononitrate (IMDUR) 60 MG 24 hr tablet TAKE 1 AND 1/2 TABLETS BY MOUTH TWO TIMES DAILY 270 tablet 3   losartan (COZAAR) 100 MG tablet TAKE 1 TABLET BY MOUTH DAILY 90 tablet 0   nitroGLYCERIN (NITROSTAT) 0.4 MG SL tablet PLACE 1 TABLET UNDER TONGUE EVERY 5 MIN AS NEEDED FOR CHEST PAIN IF NO RELIEF IN15 MIN CALL 911 (MAX 3 TABS) 25 tablet 3   Probiotic Product (PROBIOTIC PO) Take 1 capsule by mouth daily.     propranolol (INDERAL) 20 MG tablet TAKE ONE TABLET 3 TIMES DAILY AS NEEDED FOR TACHYCARDIA 270 tablet 3   ranolazine (RANEXA) 500 MG 12 hr tablet TAKE 1 TABLET BY MOUTH TWICE DAILY 180 tablet  0   vitamin B-12 (CYANOCOBALAMIN) 500 MCG tablet Take 500 mcg by mouth daily.     Ipratropium-Albuterol (COMBIVENT) 20-100 MCG/ACT AERS respimat Inhale 1 puff into the lungs every 6 (six) hours as needed for wheezing. 4 g 6   No facility-administered medications prior to visit.    Review of Systems;  Patient denies headache, fevers, malaise, unintentional weight loss, skin rash, eye pain, sinus congestion and sinus pain, sore throat, dysphagia,  hemoptysis , cough, dyspnea, wheezing, chest pain, palpitations, orthopnea, edema, abdominal pain, nausea, melena, diarrhea, constipation, flank pain, dysuria, hematuria, urinary  Frequency, nocturia, numbness, tingling, seizures,  Focal weakness, Loss of consciousness,  Tremor, insomnia, depression, anxiety, and suicidal ideation.      Objective:  BP 125/80 (BP Location: Left Arm, Patient Position: Sitting, Cuff Size: Normal)    Pulse 79    Temp 97.7 F (36.5 C) (Oral)    Ht 5\' 3"  (1.6 m)    Wt 120 lb 6.4 oz (54.6 kg)    SpO2 95%    BMI 21.33 kg/m   BP Readings from Last 3 Encounters:  10/11/21 125/80  07/07/21 132/62  05/17/21 130/76    Wt Readings from Last 3 Encounters:  10/11/21 120 lb 6.4 oz (54.6 kg)  07/07/21 121 lb (54.9 kg)  05/17/21 119 lb 3.2 oz (54.1 kg)    General appearance: alert, cooperative and appears stated age Ears: normal TM's and external ear canals both ears Throat: lips, mucosa, and tongue normal; teeth and gums normal Neck: no adenopathy, no carotid bruit, supple, symmetrical, trachea midline and thyroid not enlarged, symmetric, no tenderness/mass/nodules Back: symmetric, no curvature. ROM normal. No CVA tenderness. Lungs: clear to auscultation bilaterally Heart: regular rate and rhythm, S1, S2 normal, no murmur, click, rub or gallop Abdomen: soft, non-tender; bowel sounds normal; no masses,  no organomegaly Pulses: 2+ and symmetric Skin: Skin color, texture, turgor normal. No rashes or lesions Lymph nodes:  Cervical, supraclavicular, and axillary nodes normal.  Lab Results  Component Value Date   HGBA1C 5.8 01/30/2017   HGBA1C 5.8 09/26/2016    Lab Results  Component Value Date   CREATININE 1.42 (H) 07/07/2021   CREATININE 1.45 (H) 04/01/2021   CREATININE 1.42 (H) 01/25/2021    Lab Results  Component Value Date   WBC 6.8 07/07/2021   HGB 12.4 07/07/2021   HCT 37.2 07/07/2021   PLT 191.0 07/07/2021   GLUCOSE 112 (H) 07/07/2021   CHOL 128 04/02/2021   TRIG 108 04/02/2021   HDL 48 04/02/2021   LDLDIRECT 72.0 11/09/2015   LDLCALC 58 04/02/2021   ALT 11 07/07/2021   AST 14 07/07/2021   NA 141 07/07/2021   K 4.7 07/07/2021   CL 103  07/07/2021   CREATININE 1.42 (H) 07/07/2021   BUN 19 07/07/2021   CO2 29 07/07/2021   TSH 2.47 07/07/2021   INR 0.9 01/31/2018   HGBA1C 5.8 01/30/2017   MICROALBUR 2.5 (H) 12/26/2011    MM 3D SCREEN BREAST BILATERAL  Result Date: 06/26/2021 CLINICAL DATA:  Screening. EXAM: DIGITAL SCREENING BILATERAL MAMMOGRAM WITH TOMOSYNTHESIS AND CAD TECHNIQUE: Bilateral screening digital craniocaudal and mediolateral oblique mammograms were obtained. Bilateral screening digital breast tomosynthesis was performed. The images were evaluated with computer-aided detection. COMPARISON:  Previous exam(s). ACR Breast Density Category c: The breast tissue is heterogeneously dense, which may obscure small masses. FINDINGS: There are no findings suspicious for malignancy. IMPRESSION: No mammographic evidence of malignancy. A result letter of this screening mammogram will be mailed directly to the patient. RECOMMENDATION: Screening mammogram in one year. (Code:SM-B-01Y) BI-RADS CATEGORY  1: Negative. Electronically Signed   By: Ammie Ferrier M.D.   On: 06/26/2021 07:32   Assessment & Plan:   Problem List Items Addressed This Visit     Hyperlipidemia    LDL is at goal (<70) on current statin and LFTs are normal.   No changes needed    Lab Results  Component Value  Date   CHOL 128 04/02/2021   HDL 48 04/02/2021   LDLCALC 58 04/02/2021   LDLDIRECT 72.0 11/09/2015   TRIG 108 04/02/2021   CHOLHDL 2.7 04/02/2021   Lab Results  Component Value Date   ALT 11 07/07/2021   AST 14 07/07/2021   ALKPHOS 61 07/07/2021   BILITOT 0.7 07/07/2021         Relevant Orders   TSH   Lipid panel   Coronary artery disease of native artery of native heart with stable angina pectoris ([place])    She denies recurrent exertional chest pain on current regimen of Imdur and Ranexa .  Last known EF was 55 to 60% by ECHO July 2022       Stage 3b chronic kidney disease ([place])   Relevant Orders   Comprehensive metabolic panel   CBC with Differential/Platelet   COPD, moderate ([place])    She remains asymptomatic at rest and with ADLS .  She has requested refill of her Combivent mdi which she uses less than weekly   Vaccinations reviewed and up to date       Relevant Medications   Ipratropium-Albuterol (COMBIVENT RESPIMAT) 20-100 MCG/ACT AERS respimat   Do not resuscitate discussion - Primary    DNR status has been requested by patient after risks and benefits of the order were discussed today .  Order given to patient and placed in chart.      Relevant Orders   DNR (Do Not Resuscitate)   Insomnia secondary to anxiety    She is no longer taking  Lorazepam.  She is using alprazolam for early wakenings The risks and benefits of benzodiazepine use were discussed with patient today including excessive sedation leading to respiratory depression,  impaired thinking/driving, and addiction.  Patient was advised to avoid concurrent use with alcohol, to use medication only as needed and not to share with others  .       Mild malnutrition ([place])    She has had a 5 lb wt loss over the past year.  I have reviewed her diet and recommended that she increase her protein and fat intake while monitoring her carbohydrates.       Diastolic CHF, chronic ([facility])    She is euvolemic and BP is  well controlled.  No changes today       Hyperparathyroidism due to renal insufficiency ([facility])    Electrolytes remain in normal range.  Lab Results  Component Value Date   NA 141 07/07/2021   K 4.7 07/07/2021   CL 103 07/07/2021   CO2 29 07/07/2021   Lab Results  Component Value Date   PTH 36 06/11/2020   CALCIUM 8.9 07/07/2021         Mild cognitive impairment with memory loss    She has required increased assistance from her daugher [name] regarding management of medications  And has appointed her healthcare POA       Meds ordered this encounter  Medications   Ipratropium-Albuterol (COMBIVENT RESPIMAT) 20-100 MCG/ACT AERS respimat    Sig: INHALE ONE PUFF EVERY SIX HOURS    Dispense:  4 g    Refill:  5     I spent 30 minutes dedicated to the care of this patient on the date of this encounter to include pre-visit review of her medical history,  most recent imaging studies, Face-to-face time with the patient , and post visit ordering of testing and therapeutics.    Follow-up: Return in about 3 months (around 01/09/2022).   [name], MD

## 2021-10-11 NOTE — Patient Instructions (Addendum)
Use the flushable wipes to keep your genital area clean between showers   We signed a DO NOT RESUSCITATE ORDER TODAY .  This form protects you from having CPR done on you if your heart were to stop or if you were to stop breathing    You have mild kidney function that is STABLE.  If it starts to decline we will discuss what seeing a kidney specialist will involve  Return in 3 months

## 2021-10-12 ENCOUNTER — Encounter: Payer: Self-pay | Admitting: Internal Medicine

## 2021-10-12 DIAGNOSIS — G3184 Mild cognitive impairment, so stated: Secondary | ICD-10-CM | POA: Insufficient documentation

## 2021-10-12 DIAGNOSIS — F0154 Vascular dementia, unspecified severity, with anxiety: Secondary | ICD-10-CM | POA: Insufficient documentation

## 2021-10-12 NOTE — Assessment & Plan Note (Signed)
She is no longer taking  Lorazepam.  She is using alprazolam for early wakenings The risks and benefits of benzodiazepine use were discussed with patient today including excessive sedation leading to respiratory depression,  impaired thinking/driving, and addiction.  Patient was advised to avoid concurrent use with alcohol, to use medication only as needed and not to share with others  .

## 2021-10-12 NOTE — Assessment & Plan Note (Signed)
She remains asymptomatic at rest and with ADLS .  She has requested refill of her Combivent mdi which she uses less than weekly   Vaccinations reviewed and up to date

## 2021-10-12 NOTE — Assessment & Plan Note (Addendum)
She denies recurrent exertional chest pain on current regimen of Imdur and Ranexa .  Last known EF was 55 to 60% by ECHO July 2022

## 2021-10-12 NOTE — Assessment & Plan Note (Signed)
She has had a 5 lb wt loss over the past year.  I have reviewed her diet and recommended that she increase her protein and fat intake while monitoring her carbohydrates.

## 2021-10-12 NOTE — Assessment & Plan Note (Signed)
Diagnosis and management reviewed with patient.   Nephrology referral declined.  She is avoiding NSAIDs

## 2021-10-12 NOTE — Assessment & Plan Note (Signed)
Electrolytes remain in normal range.  Lab Results  Component Value Date   NA 141 07/07/2021   K 4.7 07/07/2021   CL 103 07/07/2021   CO2 29 07/07/2021   Lab Results  Component Value Date   PTH 36 06/11/2020   CALCIUM 8.9 07/07/2021

## 2021-10-12 NOTE — Assessment & Plan Note (Signed)
She is euvolemic and BP is well controlled.  No changes today

## 2021-10-12 NOTE — Assessment & Plan Note (Signed)
DNR status has been requested by patient after risks and benefits of the order were discussed today .  Order given to patient and placed in chart.

## 2021-10-12 NOTE — Assessment & Plan Note (Signed)
LDL is at goal (<70) on current statin and LFTs are normal.   No changes needed    Lab Results  Component Value Date   CHOL 128 04/02/2021   HDL 48 04/02/2021   LDLCALC 58 04/02/2021   LDLDIRECT 72.0 11/09/2015   TRIG 108 04/02/2021   CHOLHDL 2.7 04/02/2021   Lab Results  Component Value Date   ALT 11 07/07/2021   AST 14 07/07/2021   ALKPHOS 61 07/07/2021   BILITOT 0.7 07/07/2021

## 2021-10-12 NOTE — Assessment & Plan Note (Signed)
She has required increased assistance from her daugher [name] regarding management of medications  And has appointed her healthcare POA

## 2021-10-13 DIAGNOSIS — H353221 Exudative age-related macular degeneration, left eye, with active choroidal neovascularization: Secondary | ICD-10-CM | POA: Diagnosis not present

## 2021-10-15 ENCOUNTER — Encounter: Payer: Self-pay | Admitting: Cardiovascular Disease

## 2021-10-15 ENCOUNTER — Other Ambulatory Visit: Payer: Self-pay

## 2021-10-15 ENCOUNTER — Ambulatory Visit (INDEPENDENT_AMBULATORY_CARE_PROVIDER_SITE_OTHER): Payer: Medicare Other | Admitting: Cardiovascular Disease

## 2021-10-15 VITALS — BP 160/70 | HR 83 | Ht 63.0 in | Wt 119.4 lb

## 2021-10-15 DIAGNOSIS — L570 Actinic keratosis: Secondary | ICD-10-CM | POA: Diagnosis not present

## 2021-10-15 DIAGNOSIS — I700 Atherosclerosis of aorta: Secondary | ICD-10-CM | POA: Diagnosis not present

## 2021-10-15 DIAGNOSIS — E785 Hyperlipidemia, unspecified: Secondary | ICD-10-CM | POA: Diagnosis not present

## 2021-10-15 DIAGNOSIS — N183 Chronic kidney disease, stage 3 unspecified: Secondary | ICD-10-CM

## 2021-10-15 DIAGNOSIS — L720 Epidermal cyst: Secondary | ICD-10-CM | POA: Diagnosis not present

## 2021-10-15 DIAGNOSIS — Z85828 Personal history of other malignant neoplasm of skin: Secondary | ICD-10-CM | POA: Diagnosis not present

## 2021-10-15 DIAGNOSIS — L814 Other melanin hyperpigmentation: Secondary | ICD-10-CM | POA: Diagnosis not present

## 2021-10-15 DIAGNOSIS — I6523 Occlusion and stenosis of bilateral carotid arteries: Secondary | ICD-10-CM | POA: Diagnosis not present

## 2021-10-15 DIAGNOSIS — Z951 Presence of aortocoronary bypass graft: Secondary | ICD-10-CM | POA: Diagnosis not present

## 2021-10-15 DIAGNOSIS — L821 Other seborrheic keratosis: Secondary | ICD-10-CM | POA: Diagnosis not present

## 2021-10-15 DIAGNOSIS — I10 Essential (primary) hypertension: Secondary | ICD-10-CM | POA: Diagnosis not present

## 2021-10-15 DIAGNOSIS — I25118 Atherosclerotic heart disease of native coronary artery with other forms of angina pectoris: Secondary | ICD-10-CM

## 2021-10-15 MED ORDER — NITROGLYCERIN 0.4 MG SL SUBL: SUBLINGUAL_TABLET | SUBLINGUAL | 3 refills | Status: DC

## 2021-10-15 NOTE — Patient Instructions (Addendum)
Please monitor blood pressure at home Goal 130 on top number   Medication Instructions:  No changes  Nitro as needed Furosemide as needed for leg swelling, shortness of breath  If you need a refill on your cardiac medications before your next appointment, please call your pharmacy.   Lab work: No new labs needed  Testing/Procedures: No new testing needed  Follow-Up: At [facility], you and your health needs are our priority.  As part of our continuing mission to provide you with exceptional heart care, we have created designated Provider Care Teams.  These Care Teams include your primary Cardiologist (physician) and Advanced Practice Providers (APPs -  Physician Assistants and Nurse Practitioners) who all work together to provide you with the care you need, when you need it.   You will need a follow up appointment in 6 months  Providers on your designated Care Team:   Murray Hodgkins, NP Christell Faith, PA-C Cadence Kathlen Mody, [place]  COVID-19 Vaccine Information can be found at: [email or web address] For questions related to vaccine distribution or appointments, please email [email or web address] or call [phone].

## 2021-10-15 NOTE — Progress Notes (Signed)
Cardiology Office Note  Date:  10/15/2021   ID:  [name], DOB 1926/06/30, MRN [id number]  PCP:  [name], MD   Chief Complaint  Patient presents with   6 month follow up     Patient c/o chest pain/tightness and shortness of breath with activity. Medications reviewed by the patient verbally.     HPI:  [name] is a pleasant 88 year old woman with a history of  CAD, bypass surgery in 1999, Stress test October 2017 with no ischemia COPD, Smoking for 40 years who quit in 1985,  chronic renal insufficiency,  hypertension,  hyperlipidemia,  40-50% bilateral carotid arterial disease  EF >60% in 05/2015  C. difficile in January 2015. H/o fall wet floor  02/2017, Suffered a pelvic fracture, left shoulder pain who presents for routine followup of her coronary artery disease  In follow-up today reports having continued episodes of shortness of breath, chest tightness " Do not know if it is my lungs on my heart" " Combivent cost $200, just got it" When she does have shortness of breath or chest discomfort, sometimes will present at rest, tries a puff of Combivent Wants to make sure it is okay to take a nitro Does not check blood pressure at home, running high on today's visit Not taking Lasix, legs are somewhat swollen  Not interested in ischemic work-up  Sometimes with palpitations,  Echocardiogram July 2022 reviewed normal LV function, no mention of elevated right heart pressures, no significant TR  EKG personally reviewed by myself on todays visit Normal sinus rhythm rate 83 bpm nonspecific ST and T wave abnormality  Other past medical history reviewed Prior stress test in 2020 and 2019 Prior stress test reviewed with her September 2020, low risk  Previously treated with chemotherapy for cancer on right lower extremity  Hospital admission 12/21/2016 for COPD exacerbation  PMH:   has a past medical history of C. difficile colitis, CAD (coronary artery  disease), Carotid artery disease ([place]), Closed fracture pubis ([place]) (07/04/2017), COPD (chronic obstructive pulmonary disease) ([place]), Diffuse cystic mastopathy, Diverticulitis, Dizziness, Gait abnormality, GERD (gastroesophageal reflux disease), History of migraines, CABG, Hyperlipidemia, Hypertension, Indigestion, Kidney stones, Rheumatic fever, SCC (squamous cell carcinoma), face (08/01/2016), Sinus bradycardia, SOB (shortness of breath), and Syncopal episodes.  PSH:    Past Surgical History:  Procedure Laterality Date   APPENDECTOMY  1950   BREAST BIOPSY Right 1994   BREAST CYST ASPIRATION     multiple BIL   CATARACT EXTRACTION Right 2009   CATARACT EXTRACTION Left 2011   COLONOSCOPY  [id number]   Dr. Jamal Collin   CORONARY ARTERY BYPASS GRAFT  1999   esophagus stretched     [place]   due to metrorrhagia    Current Outpatient Medications  Medication Sig Dispense Refill   ALPRAZolam (XANAX) 0.25 MG tablet Take 1 tablet (0.25 mg total) by mouth at bedtime as needed. 30 tablet 5   atorvastatin (LIPITOR) 40 MG tablet TAKE 1 TABLET BY MOUTH DAILY 90 tablet 1   clopidogrel (PLAVIX) 75 MG tablet TAKE 1 TABLET BY MOUTH DAILY 90 tablet 0   Cranberry 125 MG TABS Take 1 tablet by mouth daily.     esomeprazole (NEXIUM) 40 MG capsule Take 40 mg by mouth daily at 12 noon.     furosemide (LASIX) 20 MG tablet TAKE 1 TABLET BY MOUTH 1-2 DAYS PER WEEKAS NEEDED 10 tablet 0   Ipratropium-Albuterol (COMBIVENT RESPIMAT) 20-100 MCG/ACT AERS  respimat INHALE ONE PUFF EVERY SIX HOURS 4 g 5   isosorbide mononitrate (IMDUR) 60 MG 24 hr tablet TAKE 1 AND 1/2 TABLETS BY MOUTH TWO TIMES DAILY 270 tablet 3   losartan (COZAAR) 100 MG tablet TAKE 1 TABLET BY MOUTH DAILY 90 tablet 0   nitroGLYCERIN (NITROSTAT) 0.4 MG SL tablet PLACE 1 TABLET UNDER TONGUE EVERY 5 MIN AS NEEDED FOR CHEST PAIN IF NO RELIEF IN15 MIN CALL 911 (MAX 3 TABS) 25 tablet 3   Probiotic Product  (PROBIOTIC PO) Take 1 capsule by mouth daily.     propranolol (INDERAL) 20 MG tablet TAKE ONE TABLET 3 TIMES DAILY AS NEEDED FOR TACHYCARDIA 270 tablet 3   ranolazine (RANEXA) 500 MG 12 hr tablet TAKE 1 TABLET BY MOUTH TWICE DAILY 180 tablet 0   vitamin B-12 (CYANOCOBALAMIN) 500 MCG tablet Take 500 mcg by mouth daily.     No current facility-administered medications for this visit.     Allergies:   Ciprofloxacin   Social History:  The patient  reports that she quit smoking about 38 years ago. Her smoking use included cigarettes. She has a 20.00 pack-year smoking history. She has never used smokeless tobacco. She reports that she does not currently use alcohol. She reports that she does not use drugs.   Family History:   family history includes Heart disease in her father and mother.    Review of Systems: Review of Systems  Constitutional: Negative.   HENT: Negative.    Respiratory:  Positive for shortness of breath.   Cardiovascular:  Positive for chest pain.  Gastrointestinal: Negative.   Musculoskeletal: Negative.   Neurological: Negative.   Psychiatric/Behavioral: Negative.    All other systems reviewed and are negative.  PHYSICAL EXAM: VS:  BP (!) 160/70 (BP Location: Left Arm, Patient Position: Sitting, Cuff Size: Normal)    Pulse 83    Ht 5\' 3"  (1.6 m)    Wt 119 lb 6 oz (54.1 kg)    SpO2 97%    BMI 21.15 kg/m  , BMI Body mass index is 21.15 kg/m. Constitutional:  oriented to person, place, and time. No distress.  HENT:  Head: Grossly normal Eyes:  no discharge. No scleral icterus.  Neck: No JVD, no carotid bruits  Cardiovascular: Regular rate and rhythm, no murmurs appreciated Trace pitting lower extremity edema bilaterally Pulmonary/Chest: Clear to auscultation bilaterally, no wheezes or rails Abdominal: Soft.  no distension.  no tenderness.  Musculoskeletal: Normal range of motion Neurological:  normal muscle tone. Coordination normal. No atrophy Skin: Skin warm and  dry Psychiatric: normal affect, pleasant  Recent Labs: 07/07/2021: ALT 11; BUN 19; Creatinine, Ser 1.42; Hemoglobin 12.4; Magnesium 2.1; Platelets 191.0; Potassium 4.7; Sodium 141; TSH 2.47    Lipid Panel Lab Results  Component Value Date   CHOL 128 04/02/2021   HDL 48 04/02/2021   LDLCALC 58 04/02/2021   TRIG 108 04/02/2021    Wt Readings from Last 3 Encounters:  10/15/21 119 lb 6 oz (54.1 kg)  10/11/21 120 lb 6.4 oz (54.6 kg)  07/07/21 121 lb (54.9 kg)     ASSESSMENT AND PLAN:  Essential hypertension -  Blood pressure elevated in the office today, recommend she monitor blood pressure at home and call us if it continues to run high Discussed that high blood pressure could contribute to chest pain/anginal symptoms and shortness of breath as well as fluid retention, hypertensive heart disease -No medication changes made, recommended Lasix as needed for worsening shortness of  breath, high blood pressure, leg swelling  Coronary artery disease involving native coronary artery of native heart without angina pectoris -  Chronic stable angina She does not want cardiac catheterization Would be high risk catheterization given her age and complexity of disease Recommended continued use of nitro sublingual, Monitoring her blood pressure at home Continue isosorbide, Ranexa  Shortness of breath COPD, chronic angina Recommended use of inhalers, nitro, Lasix sparingly She does not want ischemic work-up  History of bypass surgery Nitro as needed Previously declined catheterization and stress testing  Carotid stenosis Less than 39% bilaterally, significant plaque noted Cholesterol at goal  Hyperlipidemia Cholesterol is at goal on the current lipid regimen. No changes to the medications were made.  Leg swelling Unable to exclude mildly elevated right heart pressures given underlying COPD, Given normal renal function, recommended Lasix 20 mg up to 2 times a week (sparingly) This  may help with her shortness of breath and chest tightness Recommend leg elevation, compression hose   Total encounter time more than 30 minutes  Greater than 50% was spent in counseling and coordination of care with the patient    No orders of the defined types were placed in this encounter.    Signed, Esmond Plants, M.D., Ph.D. 10/15/2021  [place], [place]

## 2021-10-27 ENCOUNTER — Ambulatory Visit (INDEPENDENT_AMBULATORY_CARE_PROVIDER_SITE_OTHER): Payer: Medicare Other | Admitting: *Deleted

## 2021-10-27 DIAGNOSIS — J449 Chronic obstructive pulmonary disease, unspecified: Secondary | ICD-10-CM

## 2021-10-27 DIAGNOSIS — I10 Essential (primary) hypertension: Secondary | ICD-10-CM

## 2021-10-27 NOTE — Patient Instructions (Addendum)
Visit Information  Thank you for taking time to visit with me today. Please don't hesitate to contact me if I can be of assistance to you before our next scheduled telephone appointment.  Following are the goals we discussed today:  Identify and avoid  triggers Identify and remove indoor air pollutants Limit outdoor activity during cold weather  Rinse mouth after inhaler use Continue dry mouth treatment with sips of water, hard candy, chewing gum check blood pressure daily Write blood pressure results in a log  Take log to medical appointments for provider review Low salt diet  Our next appointment is by telephone on 3/10 at 1100  Please call the care guide team at [phone] if you need to cancel or reschedule your appointment.   If you are experiencing a Mental Health or [place] or need someone to talk to, please call the Suicide and Crisis Lifeline: 988 call the [place] National Suicide Prevention Lifeline: 331-625-5919 or TTY: (989) 838-1068 TTY (862) 112-2890) to talk to a trained counselor call 1-800-273-TALK (toll free, 24 hour hotline) call 911   Patient verbalizes understanding of instructions and care plan provided today and agrees to view in [place]. Active MyChart status confirmed with patient.    Hubert Azure RN, MSN RN Care Management Coordinator [place] [phone] [email or web address]

## 2021-10-31 NOTE — Chronic Care Management (AMB) (Signed)
Chronic Care Management   CCM RN Visit Note  10/31/2021 Name: [name] MRN: [id number] DOB: May 08, 1926  Subjective: [name] is a 88 y.o. year old female who is a primary care patient of Tullo, Aris Everts, MD. The care management team was consulted for assistance with disease management and care coordination needs.    Engaged with patient by telephone for follow up visit in response to provider referral for case management and/or care coordination services.   Consent to Services:  The patient was given information about Chronic Care Management services, agreed to services, and gave verbal consent prior to initiation of services.  Please see initial visit note for detailed documentation.   Patient agreed to services and verbal consent obtained.   Assessment: Review of patient past medical history, allergies, medications, health status, including review of consultants reports, laboratory and other test data, was performed as part of comprehensive evaluation and provision of chronic care management services.   SDOH (Social Determinants of Health) assessments and interventions performed:    CCM Care Plan  Allergies  Allergen Reactions   Ciprofloxacin     ? Caused c diff     Outpatient Encounter Medications as of 10/27/2021  Medication Sig Note   ALPRAZolam (XANAX) 0.25 MG tablet Take 1 tablet (0.25 mg total) by mouth at bedtime as needed.    atorvastatin (LIPITOR) 40 MG tablet TAKE 1 TABLET BY MOUTH DAILY    clopidogrel (PLAVIX) 75 MG tablet TAKE 1 TABLET BY MOUTH DAILY    Cranberry 125 MG TABS Take 1 tablet by mouth daily.    esomeprazole (NEXIUM) 40 MG capsule Take 40 mg by mouth daily at 12 noon.    furosemide (LASIX) 20 MG tablet TAKE 1 TABLET BY MOUTH 1-2 DAYS PER WEEKAS NEEDED    Ipratropium-Albuterol (COMBIVENT RESPIMAT) 20-100 MCG/ACT AERS respimat INHALE ONE PUFF EVERY SIX HOURS    isosorbide mononitrate (IMDUR) 60 MG 24 hr tablet TAKE 1 AND 1/2 TABLETS BY  MOUTH TWO TIMES DAILY    losartan (COZAAR) 100 MG tablet TAKE 1 TABLET BY MOUTH DAILY    nitroGLYCERIN (NITROSTAT) 0.4 MG SL tablet PLACE 1 TABLET UNDER TONGUE EVERY 5 MIN AS NEEDED FOR CHEST PAIN IF NO RELIEF IN15 MIN CALL 911 (MAX 3 TABS)    Probiotic Product (PROBIOTIC PO) Take 1 capsule by mouth daily.    propranolol (INDERAL) 20 MG tablet TAKE ONE TABLET 3 TIMES DAILY AS NEEDED FOR TACHYCARDIA 03/15/2021: Hasn't needed in long time per patient   ranolazine (RANEXA) 500 MG 12 hr tablet TAKE 1 TABLET BY MOUTH TWICE DAILY    vitamin B-12 (CYANOCOBALAMIN) 500 MCG tablet Take 500 mcg by mouth daily.    No facility-administered encounter medications on file as of 10/27/2021.    Patient Active Problem List   Diagnosis Date Noted   Mild cognitive impairment with memory loss 10/12/2021   Hyperparathyroidism due to renal insufficiency ([place]) 02/02/2021   CKD (chronic kidney disease) stage 3, GFR 30-59 ml/min (HCC) 01/15/2021   History of esophageal stricture 04/08/2020   Leg swelling 03/09/2020   Atypical chest pain 01/10/2020   B12 deficiency 01/09/2020   Mild malnutrition ([place]) 73/41/9379   Diastolic CHF, chronic ([place]) 03/25/2019   Vitamin D deficiency 03/25/2019   S/P excision of skin lesion, follow-up exam 12/26/2017   Impingement syndrome of left shoulder region 07/04/2017   Allergic rhinitis 12/31/2016   Osteoporosis of forearm 02/13/2016   Insomnia secondary to anxiety 07/12/2015   Anxiety disorder 07/12/2015  Essential (primary) hypertension 10/07/2014   Sinus bradycardia    Hardening of the aorta (main artery of the heart) (HCC) 06/26/2014   History of Clostridium difficile colitis 10/20/2013   History of pancreatitis 10/20/2013   Low back pain 10/04/2013   Do not resuscitate discussion 02/07/2013   Chest pain at rest 12/26/2011   COPD, moderate ([place]) 12/05/2011   Presence of aortocoronary bypass graft    Stage 3b chronic kidney disease ([place]) 09/25/2011   Hyperlipidemia     SOB (shortness of breath)    Coronary artery disease of native artery of native heart with stable angina pectoris ([place])    Carotid artery disease ([place])     Conditions to be addressed/monitored:HTN and COPD  Care Plan : COPD (Adult)  Updates made by Leona Singleton, RN since 10/31/2021 12:00 AM     Problem: Symptom Exacerbation (COPD/HTN)   Priority: Medium     Long-Range Goal: Symptom Exacerbation Prevented or Minimized  (COPD/HTN)   Start Date: 03/15/2021  Expected End Date: 03/15/2022  Priority: Medium  Note:   Current Barriers:  Knowledge deficits related to basic understanding of COPD disease process Knowledge deficits related to basic COPD self care/management history of COPD; Denies current shortness of breath.  Unsure of last time using the combivent inhaler.  Reports shortness of breath typically when going up/down stair.  Bed room still upstairs.  Reports taking blood pressure about once a week.  BP 164/74.  Discussed importance of monitoring blood pressure and notifying provider for sustained elevations Case Manager Clinical Goal(s): patient will report using inhalers as prescribed including rinsing mouth after use patient will verbalize understanding of COPD action plan and when to seek appropriate levels of medical care patient will verbalize basic understanding of COPD disease process and self care activities   COPD: (Status: Goal on Track (progressing): YES.) Long Term Goal  Interventions:  Collaboration with [name], MD regarding development and update of comprehensive plan of care as evidenced by provider attestation and co-signature Inter-disciplinary care team collaboration (see longitudinal plan of care) Provided patient with basic written and verbal COPD education on self care/management/and exacerbation prevention  Provided patient with COPD action plan and reinforced importance of daily self assessment Provided instruction about proper use of medications  used for management of COPD including inhalers Advised patient to self assesses COPD action plan zone and make appointment with provider if in the yellow zone for 48 hours without improvement. Provided education about and advised patient to utilize infection prevention strategies to reduce risk of respiratory infection  Healthy lifestyle promoted and encouraged to develop a rescue plan Rescue (action) plan reviewed and signs/symptoms of worsening disease assessed; encouraged to follow rescue plan if symptoms flare-up Discussed eliminating symptom triggers at home Encouraged to attend all scheduled provider appointments   Instructed to call provider office for new concerns or questions Reviewed and assessed barriers and manage adherence, including inhaler technique and persistent trigger exposure; encourage adherence, even when symptoms are controlled or infrequent Encouraged to monitor for signs/symptoms of psychosocial concerns, such as shortness of breath-anxiety cycle or depression that may impact stability of symptoms  Hypertension: (Status: Goal on track: NO.) Long Term Goal  Last practice recorded BP readings:  BP Readings from Last 3 Encounters:  07/07/21 132/62  05/17/21 130/76  05/04/21 (!) 129/53  Most recent eGFR/CrCl: No results found for: EGFR  No components found for: CRCL  Evaluation of current treatment plan related to hypertension self management and patient's adherence to  plan as established by provider;   Provided education to patient re: stroke prevention, s/s of heart attack and stroke; Reviewed prescribed diet low salt Reviewed medications with patient and discussed importance of compliance;  Discussed plans with patient for ongoing care management follow up and provided patient with direct contact information for care management team; Advised patient, providing education and rationale, to monitor blood pressure daily and record, calling PCP for findings outside  established parameters;  Discussed complications of poorly controlled blood pressure such as heart disease, stroke, circulatory complications, vision complications, kidney impairment, sexual dysfunction;   Discussed importance of monitoring blood pressure daily and notifying provider for sustained elevations Patient Goals/Self-Care Activities: : Identify and avoid  triggers Identify and remove indoor air pollutants Limit outdoor activity during cold weather  Rinse mouth after inhaler use Continue dry mouth treatment with sips of water, hard candy, chewing gum check blood pressure daily Write blood pressure results in a log  Take log to medical appointments for provider review Low salt diet Follow Up Plan: The care management team will reach out to the patient again over the next 45 business days.       Plan:The care management team will reach out to the patient again over the next 45 days.  Hubert Azure RN, MSN RN Care Management Coordinator [place] [phone] [email or web address]

## 2021-11-02 ENCOUNTER — Other Ambulatory Visit: Payer: Self-pay | Admitting: Cardiovascular Disease

## 2021-11-16 DIAGNOSIS — J449 Chronic obstructive pulmonary disease, unspecified: Secondary | ICD-10-CM

## 2021-11-16 DIAGNOSIS — I10 Essential (primary) hypertension: Secondary | ICD-10-CM

## 2021-11-18 ENCOUNTER — Encounter: Payer: Self-pay | Admitting: Emergency Medicine

## 2021-11-18 ENCOUNTER — Emergency Department: Payer: Medicare Other

## 2021-11-18 ENCOUNTER — Other Ambulatory Visit: Payer: Self-pay

## 2021-11-18 DIAGNOSIS — Z7901 Long term (current) use of anticoagulants: Secondary | ICD-10-CM | POA: Diagnosis not present

## 2021-11-18 DIAGNOSIS — R531 Weakness: Secondary | ICD-10-CM | POA: Diagnosis not present

## 2021-11-18 DIAGNOSIS — J449 Chronic obstructive pulmonary disease, unspecified: Secondary | ICD-10-CM | POA: Diagnosis not present

## 2021-11-18 DIAGNOSIS — I129 Hypertensive chronic kidney disease with stage 1 through stage 4 chronic kidney disease, or unspecified chronic kidney disease: Secondary | ICD-10-CM | POA: Insufficient documentation

## 2021-11-18 DIAGNOSIS — Z79899 Other long term (current) drug therapy: Secondary | ICD-10-CM | POA: Insufficient documentation

## 2021-11-18 DIAGNOSIS — R059 Cough, unspecified: Secondary | ICD-10-CM | POA: Diagnosis not present

## 2021-11-18 DIAGNOSIS — Z20822 Contact with and (suspected) exposure to covid-19: Secondary | ICD-10-CM | POA: Diagnosis not present

## 2021-11-18 DIAGNOSIS — I2510 Atherosclerotic heart disease of native coronary artery without angina pectoris: Secondary | ICD-10-CM | POA: Diagnosis not present

## 2021-11-18 DIAGNOSIS — N189 Chronic kidney disease, unspecified: Secondary | ICD-10-CM | POA: Diagnosis not present

## 2021-11-18 DIAGNOSIS — N390 Urinary tract infection, site not specified: Secondary | ICD-10-CM | POA: Diagnosis not present

## 2021-11-18 DIAGNOSIS — M6281 Muscle weakness (generalized): Secondary | ICD-10-CM | POA: Diagnosis present

## 2021-11-18 DIAGNOSIS — K449 Diaphragmatic hernia without obstruction or gangrene: Secondary | ICD-10-CM | POA: Diagnosis not present

## 2021-11-18 LAB — URINALYSIS, COMPLETE (UACMP) WITH MICROSCOPIC
Bacteria, UA: NONE SEEN
Bilirubin Urine: NEGATIVE
Glucose, UA: NEGATIVE mg/dL
Hgb urine dipstick: NEGATIVE
Ketones, ur: NEGATIVE mg/dL
Nitrite: NEGATIVE
Protein, ur: 30 mg/dL — AB
Specific Gravity, Urine: 1.01 (ref 1.005–1.030)
WBC, UA: >50 WBC/hpf — ABNORMAL HIGH (ref 0–5)
pH: 5 (ref 5.0–8.0)

## 2021-11-18 LAB — HEPATIC FUNCTION PANEL
ALT: 13 U/L (ref 0–44)
AST: 22 U/L (ref 15–41)
Albumin: 3.8 g/dL (ref 3.5–5.0)
Alkaline Phosphatase: 61 U/L (ref 38–126)
Bilirubin, Direct: 0.2 mg/dL (ref 0.0–0.2)
Indirect Bilirubin: 0.5 mg/dL (ref 0.3–0.9)
Total Bilirubin: 0.7 mg/dL (ref 0.3–1.2)
Total Protein: 6.2 g/dL — ABNORMAL LOW (ref 6.5–8.1)

## 2021-11-18 LAB — CBC
HCT: 39 % (ref 36.0–46.0)
Hemoglobin: 12.7 g/dL (ref 12.0–15.0)
MCH: 34 pg (ref 26.0–34.0)
MCHC: 32.6 g/dL (ref 30.0–36.0)
MCV: 104.3 fL — ABNORMAL HIGH (ref 80.0–100.0)
Platelets: 183 10*3/uL (ref 150–400)
RBC: 3.74 MIL/uL — ABNORMAL LOW (ref 3.87–5.11)
RDW: 11.9 % (ref 11.5–15.5)
WBC: 6.2 10*3/uL (ref 4.0–10.5)
nRBC: 0 % (ref 0.0–0.2)

## 2021-11-18 LAB — BASIC METABOLIC PANEL
Anion gap: 10 (ref 5–15)
BUN: 21 mg/dL (ref 8–23)
CO2: 28 mmol/L (ref 22–32)
Calcium: 8.7 mg/dL — ABNORMAL LOW (ref 8.9–10.3)
Chloride: 97 mmol/L — ABNORMAL LOW (ref 98–111)
Creatinine, Ser: 1.39 mg/dL — ABNORMAL HIGH (ref 0.44–1.00)
GFR, Estimated: 35 mL/min — ABNORMAL LOW (ref >60)
Glucose, Bld: 108 mg/dL — ABNORMAL HIGH (ref 70–99)
Potassium: 4.3 mmol/L (ref 3.5–5.1)
Sodium: 135 mmol/L (ref 135–145)

## 2021-11-18 LAB — RESP PANEL BY RT-PCR (FLU A&B, COVID) ARPGX2
Influenza A by PCR: NEGATIVE
Influenza B by PCR: NEGATIVE
SARS Coronavirus 2 by RT PCR: NEGATIVE

## 2021-11-18 LAB — TROPONIN I (HIGH SENSITIVITY)
Troponin I (High Sensitivity): 12 ng/L (ref <18)
Troponin I (High Sensitivity): 13 ng/L (ref <18)

## 2021-11-18 LAB — LIPASE, BLOOD: Lipase: 48 U/L (ref 11–51)

## 2021-11-18 NOTE — ED Provider Triage Note (Signed)
Emergency Medicine Provider Triage Evaluation Note ? ?[name] , a 88 y.o. female  was evaluated in triage.  Pt complains of weakness, fatigue.  Had a cough for several days, cough resolved and now she has been nauseated and feeling very weak.  She denies any headache, chest pain, abdominal pain, shortness of breath.  No urinary symptoms.  Patient with history of dehydration ? ?Review of Systems  ?Positive: Fatigue, weakness ?Negative: Fevers, chills, chest pain, shortness of breath ? ?Physical Exam  ?There were no vitals taken for this visit. ?Gen:   Awake, no distress   ?Resp:  Normal effort  ?MSK:   Moves extremities without difficulty  ?Other:  Patient presents in a wheelchair. ? ?Medical Decision Making  ?Medically screening exam initiated at 5:31 PM.  Appropriate orders placed.  KIMBLE DELAURENTIS was informed that the remainder of the evaluation will be completed by another provider, this initial triage assessment does not replace that evaluation, and the importance of remaining in the ED until their evaluation is complete. ? ? ?  ?Duanne Guess, PA-C ?11/18/21 1732 ? ?

## 2021-11-18 NOTE — ED Triage Notes (Signed)
Pt in via POV, reports ongoing generalized weakness x a few days, also reports some nausea and intermittent left upper quadrant abdominal pain.  A/Ox4, vitals WDL, NAD noted at this time. ?

## 2021-11-19 ENCOUNTER — Emergency Department
Admission: EM | Admit: 2021-11-19 | Discharge: 2021-11-19 | Disposition: A | Discharge status: Home / Self Care | Payer: Medicare Other | Attending: Emergency Medicine | Admitting: Emergency Medicine

## 2021-11-19 DIAGNOSIS — N390 Urinary tract infection, site not specified: Secondary | ICD-10-CM | POA: Diagnosis not present

## 2021-11-19 DIAGNOSIS — R531 Weakness: Secondary | ICD-10-CM

## 2021-11-19 LAB — MAGNESIUM: Magnesium: 2 mg/dL (ref 1.7–2.4)

## 2021-11-19 MED ORDER — CEPHALEXIN 500 MG PO CAPS: 500.0000 mg | ORAL_CAPSULE | Freq: Two times a day (BID) | ORAL | 0 refills | Status: DC

## 2021-11-19 MED ORDER — SODIUM CHLORIDE 0.9 % IV BOLUS (SEPSIS)
500.0000 mL | Freq: Once | INTRAVENOUS | Status: AC
  Administered 2021-11-19: 500 mL via INTRAVENOUS

## 2021-11-19 MED ORDER — PROBIOTIC 1-250 BILLION-MG PO CAPS: 1.0000 | ORAL_CAPSULE | Freq: Every day | ORAL | 0 refills | Status: DC

## 2021-11-19 MED ORDER — SODIUM CHLORIDE 0.9 % IV SOLN
1.0000 g | Freq: Once | INTRAVENOUS | Status: AC
  Administered 2021-11-19: 1 g via INTRAVENOUS
  Filled 2021-11-19: qty 10

## 2021-11-19 NOTE — ED Notes (Signed)
Called lab to add on Mg level. ?

## 2021-11-19 NOTE — ED Provider Notes (Signed)
[facility] Provider Note    Event Date/Time   First MD Initiated Contact with Patient 11/19/21 0021     (approximate)   History   No chief complaint on file.   HPI  [name] is a 88 y.o. female with history of CAD, COPD, hypertension, hyperlipidemia, chronic kidney disease who presents with her daughter for concerns for generalized weakness over the past couple of days.  She states she is feeling weak and is worried that she is going to fall.  She does have a walker at home that she can use as needed.  She denies any fevers, chills, chest pain, shortness of breath, vomiting, diarrhea, dysuria.  No numbness, tingling or focal weakness.  She did have some nausea yesterday that has resolved.   History provided by patient and daughter.    Past Medical History:  Diagnosis Date   C. difficile colitis    CAD (coronary artery disease)    a. s/p CABG 1999; b. Nuclear 10/11, no scar or ischemia, EF 68%; b. [place] 06/18/14: no evidence of infarction or ischemia, EF 77%, low risk study; c. 02/2019 MV: EF 56%, ? mild lat ischemia->low risk.   Carotid artery disease ([place])    a. Doppler January, 2012, 40-59% bilateral; b. 02/2017 [place]: 1-39% bilat ICA stenosis.   Closed fracture pubis ([place]) 07/04/2017   COPD (chronic obstructive pulmonary disease) (HCC)    Diffuse cystic mastopathy    Diverticulitis    last colonoscopy  incomplete Jan 2011   Dizziness    stable now (Oct 2011) patient tells me question of TIA, we will obtain records   Gait abnormality    Patient complains of "wobbly gait", December, 2013   GERD (gastroesophageal reflux disease)    History of migraines    Hx of CABG    a. 3 vessel in 1999   Hyperlipidemia    Hypertension    Indigestion    3 weeks, October 2011   Kidney stones    Rheumatic fever    SCC (squamous cell carcinoma), face 08/01/2016   Sinus bradycardia    Mild, December, 2013   SOB (shortness of breath)     Syncopal episodes    after 18 holes of golf and increase hydrochlorothiazide, resolved     Past Surgical History:  Procedure Laterality Date   [place]     multiple BIL   CATARACT EXTRACTION Right 2009   CATARACT EXTRACTION Left 2011   COLONOSCOPY  [id number]   Dr. Jamal Collin   CORONARY ARTERY BYPASS GRAFT  1999   esophagus stretched     [place]   due to metrorrhagia    MEDICATIONS:  Prior to Admission medications   Medication Sig Start Date End Date Taking? Authorizing Provider  ALPRAZolam (XANAX) 0.25 MG tablet Take 1 tablet (0.25 mg total) by mouth at bedtime as needed. 07/07/21   [name], MD  atorvastatin (LIPITOR) 40 MG tablet TAKE 1 TABLET BY MOUTH DAILY 05/10/21   [name], MD  clopidogrel (PLAVIX) 75 MG tablet TAKE 1 TABLET BY MOUTH DAILY 07/12/21   Minna Merritts, MD  Cranberry 125 MG TABS Take 1 tablet by mouth daily.    [provider]  esomeprazole (NEXIUM) 40 MG capsule Take 40 mg by mouth daily at 12 noon.    [provider]  furosemide (  LASIX) 20 MG tablet TAKE 1 TABLET BY MOUTH 1-2 DAYS PER WEEKAS NEEDED 11/02/21   Gollan, Kathlene November, MD  Ipratropium-Albuterol (COMBIVENT RESPIMAT) 20-100 MCG/ACT AERS respimat INHALE ONE PUFF EVERY SIX HOURS 10/11/21   [name], MD  isosorbide mononitrate (IMDUR) 60 MG 24 hr tablet TAKE 1 AND 1/2 TABLETS BY MOUTH TWO TIMES DAILY 05/11/21   Minna Merritts, MD  losartan (COZAAR) 100 MG tablet TAKE 1 TABLET BY MOUTH DAILY 07/12/21   Minna Merritts, MD  nitroGLYCERIN (NITROSTAT) 0.4 MG SL tablet PLACE 1 TABLET UNDER TONGUE EVERY 5 MIN AS NEEDED FOR CHEST PAIN IF NO RELIEF IN15 MIN CALL 911 (MAX 3 TABS) 10/15/21   Gollan, Kathlene November, MD  Probiotic Product (PROBIOTIC PO) Take 1 capsule by mouth daily.    [provider]  propranolol (INDERAL) 20 MG tablet TAKE ONE TABLET 3 TIMES DAILY  AS NEEDED FOR TACHYCARDIA 04/07/20   Minna Merritts, MD  ranolazine (RANEXA) 500 MG 12 hr tablet TAKE 1 TABLET BY MOUTH TWICE DAILY 07/26/21   Minna Merritts, MD  vitamin B-12 (CYANOCOBALAMIN) 500 MCG tablet Take 500 mcg by mouth daily.    [provider]    Physical Exam   Triage Vital Signs: ED Triage Vitals  Enc Vitals Group     BP 11/18/21 1732 (!) 180/86     Pulse Rate 11/18/21 1732 73     Resp 11/18/21 1732 18     Temp 11/18/21 1732 98.5 F (36.9 C)     Temp Source 11/18/21 1732 Oral     SpO2 11/18/21 1732 98 %     Weight 11/18/21 1734 129 lb (58.5 kg)     Height 11/18/21 1734 5' (1.524 m)     Head Circumference --      Peak Flow --      Pain Score 11/18/21 1734 0     Pain Loc --      Pain Edu? --      Excl. in [place]? --     Most recent vital signs: Vitals:   11/18/21 2149 11/19/21 0132  BP: (!) 154/63 (!) 163/64  Pulse: 80 73  Resp: 18   Temp:    SpO2: 97% 100%    CONSTITUTIONAL: Alert and oriented and responds appropriately to questions. Well-appearing; well-nourished, elderly HEAD: Normocephalic, atraumatic EYES: Conjunctivae clear, pupils appear equal, sclera nonicteric ENT: normal nose; moist mucous membranes NECK: Supple, normal ROM CARD: RRR; S1 and S2 appreciated; no murmurs, no clicks, no rubs, no gallops RESP: Normal chest excursion without splinting or tachypnea; breath sounds clear and equal bilaterally; no wheezes, no rhonchi, no rales, no hypoxia or respiratory distress, speaking full sentences ABD/GI: Normal bowel sounds; non-distended; soft, non-tender, no rebound, no guarding, no peritoneal signs BACK: The back appears normal EXT: Normal ROM in all joints; no deformity noted, no edema; no cyanosis SKIN: Normal color for age and race; warm; no rash on exposed skin NEURO: Moves all extremities equally, normal speech, no facial asymmetry PSYCH: The patient's mood and manner are appropriate.   ED Results / Procedures / Treatments    LABS: (all labs ordered are listed, but only abnormal results are displayed) Labs Reviewed  BASIC METABOLIC PANEL - Abnormal; Notable for the following components:      Result Value   Chloride 97 (*)    Glucose, Bld 108 (*)    Creatinine, Ser 1.39 (*)    Calcium 8.7 (*)    GFR, Estimated 35 (*)  All other components within normal limits  CBC - Abnormal; Notable for the following components:   RBC 3.74 (*)    MCV 104.3 (*)    All other components within normal limits  URINALYSIS, COMPLETE (UACMP) WITH MICROSCOPIC - Abnormal; Notable for the following components:   Color, Urine YELLOW (*)    APPearance HAZY (*)    Protein, ur 30 (*)    Leukocytes,Ua LARGE (*)    WBC, UA >50 (*)    All other components within normal limits  HEPATIC FUNCTION PANEL - Abnormal; Notable for the following components:   Total Protein 6.2 (*)    All other components within normal limits  RESP PANEL BY RT-PCR (FLU A&B, COVID) ARPGX2  URINE CULTURE  LIPASE, BLOOD  MAGNESIUM  TROPONIN I (HIGH SENSITIVITY)  TROPONIN I (HIGH SENSITIVITY)     EKG:  EKG Interpretation  Date/Time:  Thursday November 18 2021 17:33:37 EST Ventricular Rate:  72 PR Interval:  122 QRS Duration: 76 QT Interval:  440 QTC Calculation: 481 R Axis:   94 Text Interpretation: Normal sinus rhythm Rightward axis Low voltage QRS Nonspecific T wave abnormality Prolonged QT Abnormal ECG When compared with ECG of 02-Apr-2021 07:55, PREVIOUS ECG IS PRESENT Confirmed by Pryor Curia [phone]) on 11/19/2021 12:29:45 AM         RADIOLOGY: My personal review and interpretation of imaging: Chest x-ray is clear.  I have personally reviewed all radiology reports.   DG Chest 2 View  Result Date: 11/18/2021 CLINICAL DATA:  Cough EXAM: CHEST - 2 VIEW COMPARISON:  04/01/2021 FINDINGS: Transverse diameter of heart is unremarkable. There is soft tissue fullness in the retrocardiac region suggesting fixed hiatal hernia. There is evidence of  previous coronary bypass surgery. Low position of diaphragms suggests COPD. There are no signs of pulmonary edema or new focal pulmonary infiltrates. There is no pleural effusion or pneumothorax. IMPRESSION: There are no new infiltrates or signs of pulmonary edema. COPD. Fixed hiatal hernia. Electronically Signed   By: Elmer Picker M.D.   On: 11/18/2021 18:03     PROCEDURES:  Critical Care performed: No   CRITICAL CARE Performed by: Cyril Mourning Naia Ruff   Total critical care time: 0 minutes  Critical care time was exclusive of separately billable procedures and treating other patients.  Critical care was necessary to treat or prevent imminent or life-threatening deterioration.  Critical care was time spent personally by me on the following activities: development of treatment plan with patient and/or surrogate as well as nursing, discussions with consultants, evaluation of patient's response to treatment, examination of patient, obtaining history from patient or surrogate, ordering and performing treatments and interventions, ordering and review of laboratory studies, ordering and review of radiographic studies, pulse oximetry and re-evaluation of patient's condition.   Marland Kitchen1-3 Lead EKG Interpretation Performed by: Rosalee Tolley, Delice Bison, DO Authorized by: Tiras Bianchini, Delice Bison, DO     Interpretation: normal     ECG rate:  85   ECG rate assessment: normal     Rhythm: sinus rhythm     Ectopy: none     Conduction: normal      IMPRESSION / MDM / ASSESSMENT AND PLAN / ED COURSE  I reviewed the triage vital signs and the nursing notes.    Patient here with complaints of generalized weakness for the past few days.  The patient is on the cardiac monitor to evaluate for evidence of arrhythmia and/or significant heart rate changes.   DIFFERENTIAL DIAGNOSIS (includes but not limited to):  Dehydration, UTI, anemia, electrolyte derangement, ACS, arrhythmia, hypoglycemia, deconditioning,  pneumonia   PLAN: We will obtain CBC, BMP, urinalysis, troponin, EKG, chest x-ray.  Will give IV hydration   MEDICATIONS GIVEN IN ED: Medications  cefTRIAXone (ROCEPHIN) 1 g in sodium chloride 0.9 % 100 mL IVPB (0 g Intravenous Stopped 11/19/21 0131)  sodium chloride 0.9 % bolus 500 mL (0 mLs Intravenous Stopped 11/19/21 0140)     ED COURSE: Patient's labs show no leukocytosis.  Normal hemoglobin.  Troponin x2 negative.  She does have chronic kidney disease which is stable.  Normal-appearing electrolytes.  COVID and flu negative.  Urine does appear infected.  We will add on a urine culture.  Chest x-ray reviewed by myself and radiologist and shows no infiltrate, edema or pneumothorax.  Her EKG does show a slightly prolonged QT interval with nonspecific T wave flattening.  We will add on a magnesium level.  She is on cardiac monitoring and no acute events noticed.  We will give IV fluids and Rocephin and reassess.  Patient would like to be discharged home if possible.  Will p.o. challenge and ambulate in the ED.   Patient has been able to tolerate p.o. here.  She has been able to ambulate with a steady gait and reports feeling better.  I have offered her admission for UTI and generalized weakness but she declines.  Will discharge on Keflex and probiotics.  She has a PCP for follow-up.  Discussed at length return precautions.  Daughter at bedside also comfortable with plan for discharge home.   At this time, I do not feel there is any life-threatening condition present. I reviewed all nursing notes, vitals, pertinent previous records.  All lab and urine results, EKGs, imaging ordered have been independently reviewed and interpreted by myself.  I reviewed all available radiology reports from any imaging ordered this visit.  Based on my assessment, I feel the patient is safe to be discharged home without further emergent workup and can continue workup as an outpatient as needed. Discussed all findings,  treatment plan as well as usual and customary return precautions with patient and daughter.  They verbalize understanding and are comfortable with this plan.  Outpatient follow-up has been provided as needed.  All questions have been answered.    CONSULTS: Admission offered given UTI, generalized weakness and an elderly patient however she states she is feeling better and would like discharge home.   OUTSIDE RECORDS REVIEWED: Reviewed since last office visit with Dr. Ida Rogue on 10/15/2021.         FINAL CLINICAL IMPRESSION(S) / ED DIAGNOSES   Final diagnoses:  Acute UTI  Generalized weakness     Rx / DC Orders   ED Discharge Orders          Ordered    cephALEXin (KEFLEX) 500 MG capsule  2 times daily        11/19/21 0153    Bacillus Coagulans-Inulin (PROBIOTIC) 1-250 BILLION-MG CAPS  Daily        11/19/21 0153             Note:  This document was prepared using Dragon voice recognition software and may include unintentional dictation errors.   Alleene Stoy, Delice Bison, DO 11/19/21 [phone]

## 2021-11-20 LAB — URINE CULTURE: Culture: <10000 — AB

## 2021-11-22 ENCOUNTER — Telehealth: Payer: Self-pay | Admitting: Internal Medicine

## 2021-11-22 ENCOUNTER — Encounter: Payer: Self-pay | Admitting: Adult Health

## 2021-11-22 ENCOUNTER — Other Ambulatory Visit: Payer: Self-pay

## 2021-11-22 ENCOUNTER — Ambulatory Visit (INDEPENDENT_AMBULATORY_CARE_PROVIDER_SITE_OTHER): Payer: Medicare Other | Admitting: Adult Health

## 2021-11-22 VITALS — BP 140/72 | HR 88 | Temp 98.0°F | Ht 60.0 in | Wt 129.0 lb

## 2021-11-22 DIAGNOSIS — K1379 Other lesions of oral mucosa: Secondary | ICD-10-CM | POA: Diagnosis not present

## 2021-11-22 DIAGNOSIS — R8271 Bacteriuria: Secondary | ICD-10-CM

## 2021-11-22 DIAGNOSIS — Z87440 Personal history of urinary (tract) infections: Secondary | ICD-10-CM

## 2021-11-22 DIAGNOSIS — B370 Candidal stomatitis: Secondary | ICD-10-CM | POA: Insufficient documentation

## 2021-11-22 MED ORDER — CLOTRIMAZOLE 10 MG MT TROC: 10.0000 mg | Freq: Three times a day (TID) | OROMUCOSAL | 0 refills | Status: AC

## 2021-11-22 MED ORDER — NYSTATIN 100000 UNIT/ML MT SUSP: 5.0000 mL | Freq: Four times a day (QID) | OROMUCOSAL | 0 refills | Status: DC

## 2021-11-22 NOTE — Patient Instructions (Signed)
Nystatin Suspension What is this medication? NYSTATIN (nye STAT in) treats thrush, a fungal or yeast infection in the mouth. It belongs to a group of medications called antifungals. It will not treat infections caused by bacteria or viruses. This medicine may be used for other purposes; ask your health care provider or pharmacist if you have questions. COMMON BRAND NAME(S): Mycostatin, Nystex What should I tell my care team before I take this medication? They need to know if you have any of these conditions: Diabetes Kidney disease An unusual or allergic reaction to nystatin, ethylenediamine, parabens, thimerosal, other foods, dyes or preservatives Pregnant or trying to get pregnant Breast-feeding How should I use this medication? Follow the directions on the prescription label. Shake well before using. Use a specially marked dropper to measure every dose. Ask your pharmacist if you do not have one. Put one half of the dose in each side of your mouth. Swish the medication around in your mouth and gargle. Hold your dose in your mouth for as long as you can. Swallow or spit out as directed by your care team. Take your medication at regular intervals. Do not take your medication more often than directed. Do not skip doses or stop your medication early even if you feel better. Do not stop taking except on your care team's advice. Talk to your care team about the use of this medication in children. Special care may be needed. Overdosage: If you think you have taken too much of this medicine contact a poison control center or emergency room at once. NOTE: This medicine is only for you. Do not share this medicine with others. What if I miss a dose? If you miss a dose, take it as soon as you can. If it is almost time for your next dose, take only that dose. Do not take double or extra doses. What may interact with this medication? Interactions are not expected. This list may not describe all possible  interactions. Give your health care provider a list of all the medicines, herbs, non-prescription drugs, or dietary supplements you use. Also tell them if you smoke, drink alcohol, or use illegal drugs. Some items may interact with your medicine. What should I watch for while using this medication? Tell your care team if your symptoms do not improve or get worse. If you wear dentures talk to your care team about how to clean them. What side effects may I notice from receiving this medication? Side effects that you should report to your care team as soon as possible: Allergic reactions--skin rash, itching, hives, swelling of the face, lips, tongue, or throat Redness, blistering, peeling, or loosening of the skin, including inside the mouth Side effects that usually do not require medical attention (report to your care team if they continue or are bothersome): Irritation inside the mouth Upset stomach This list may not describe all possible side effects. Call your doctor for medical advice about side effects. You may report side effects to FDA at 1-800-FDA-1088. Where should I keep my medication? Keep out of the reach of children. Store at room temperature between 15 and 25 degrees C (59 and 77 degrees F). Protect from light. Throw away any unused medication after the expiration date. NOTE: This sheet is a summary. It may not cover all possible information. If you have questions about this medicine, talk to your doctor, pharmacist, or health care provider.  2022 Elsevier/Gold Standard (2020-12-11 00:00:00) Clotrimazole lozenges What is this medication? CLOTRIMAZOLE (kloe TRIM a  zole) is an antifungal medicine. It is used to treat certain kinds of fungal or yeast infections of the mouth. This medicine may be used for other purposes; ask your health care provider or pharmacist if you have questions. COMMON BRAND NAME(S): Mycelex [place] What should I tell my care team before I take this  medication? They need to know if you have any of these conditions: liver disease an unusual or allergic reaction to clotrimazole, other medicines, foods, dyes or preservatives pregnant or trying to get pregnant breast-feeding How should I use this medication? Allow this medicine to dissolve slowly in your mouth. Do not chew or swallow whole. Follow the directions on the prescription label. Take your medicine at regular intervals. Do not take your medicine more often than directed. Do not skip doses or stop your medicine early even if you feel better. Do not stop taking except on your doctor's advice. Talk to your pediatrician regarding the use of this medicine in children. While this drug may be prescribed for children as young as 34 years old for selected conditions, precautions do apply. Overdosage: If you think you have taken too much of this medicine contact a poison control center or emergency room at once. NOTE: This medicine is only for you. Do not share this medicine with others. What if I miss a dose? If you miss a dose, take it as soon as you can. If it is almost time for your next dose, take only that dose. Do not take double or extra doses. What may interact with this medication? Interactions are not expected. This list may not describe all possible interactions. Give your health care provider a list of all the medicines, herbs, non-prescription drugs, or dietary supplements you use. Also tell them if you smoke, drink alcohol, or use illegal drugs. Some items may interact with your medicine. What should I watch for while using this medication? Tell your doctor or health care professional if your symptoms do not improve or get worse. What side effects may I notice from receiving this medication? Side effects that you should report to your doctor or health care professional as soon as possible: allergic reactions like skin rash, itching or hives, swelling of the face, lips, or  tongue irritation in the mouth Side effects that usually do not require medical attention (report to your doctor or health care professional if they continue or are bothersome): diarrhea nausea, vomiting stomach upset This list may not describe all possible side effects. Call your doctor for medical advice about side effects. You may report side effects to FDA at 1-800-FDA-1088. Where should I keep my medication? Keep out of the reach of children. Store at room temperature below 30 degrees C (86 degrees F). Do not freeze. Throw away any unused medicine after the expiration date. NOTE: This sheet is a summary. It may not cover all possible information. If you have questions about this medicine, talk to your doctor, pharmacist, or health care provider.  2022 Elsevier/Gold Standard (2008-02-22 00:00:00) Oral Thrush, Adult Oral thrush is an infection in your mouth and throat and on your tongue. It causes white patches to form in your mouth and on your tongue. Many cases of thrush are mild. But, sometimes, thrush can be serious. People who have a weak body defense system (immune system) or other diseases can be affected more. What are the causes? This condition is caused by a type of fungus called yeast. The fungus is normally present in small amounts in the  mouth and nose. If a person has a long-term illness or a weak body defense system, the fungus can grow and spread quickly. This causes thrush. What increases the risk? You are more likely to develop this condition if: You have a weak body defense system. You are an older adult. You have diabetes, cancer, or HIV. You have a dry mouth. You are pregnant or breastfeeding. You do not take good care of your teeth. This risk is greater for people who have false teeth (dentures). You use antibiotic or steroid medicines. What are the signs or symptoms? Symptoms of this condition include: A burning feeling in the mouth and throat. White patches  that stick to the mouth and tongue. A bad taste in the mouth or trouble tasting foods. A feeling like you have cotton in your mouth. Pain when you eat and swallow. Not wanting to eat as much as usual. Cracking at the corners of the mouth. How is this treated? This condition is treated with medicines called antifungals. These medicines prevent a fungus from growing. The medicines are either put right on the area (topical) or swallowed (oral). Your doctor will also treat other problems that you may have, such as diabetes or HIV. Follow these instructions at home: Helping with pain and soreness To lessen your pain: Drink cold liquids, like water and iced tea. Eat frozen ice pops or frozen juices. Eat foods that are easy to swallow, like gelatin and ice cream. Drink from a straw if you have too much pain in your mouth.  General instructions Take or use over-the-counter and prescription medicines only as told by your doctor. Eat plain yogurt that has live cultures in it. Read the label to make sure that there are live cultures in your yogurt. If you wear false teeth: Take them out before you go to bed. Brush them well. Soak them in a cleaner. Rinse your mouth with warm salt-water many times a day. To make the salt-water mixture, dissolve -1 teaspoon (3-6 g) of salt in 1 cup (237 mL) of warm water. Contact a doctor if: Your problems do not get better within 7 days of treatment. Your infection is spreading. This may show as white areas on the skin outside of your mouth. You are nursing your baby and you have redness and pain in the nipples. Summary Oral thrush is an infection in your mouth and throat. It is caused by a fungus. You are more likely to get this condition if you have a weak body defense system. Diseases like diabetes, cancer, or HIV also add to your risk. This condition is treated with medicines called antifungals. Contact a doctor if you do not get better within 7 days of  starting treatment. This information is not intended to replace advice given to you by your health care provider. Make sure you discuss any questions you have with your health care provider. Document Revised: 05/05/2021 Document Reviewed: 07/12/2019 Elsevier Patient Education  [place].

## 2021-11-22 NOTE — Telephone Encounter (Signed)
Pt called in requesting for an appointment for emergency room visit. Pt stated that she thinks that she is having a reaction to her medication. Pt stated that her mouth is sore. No available appt. Transfer Pt to Access Nurse  ?

## 2021-11-22 NOTE — Progress Notes (Signed)
Acute Office Visit  Subjective:    Patient ID: [name], female    DOB: 1926-01-19, 88 y.o.   MRN: [id number]  Chief Complaint  Patient presents with   Follow-up    F/u -  pt c/o sore mouth, swollen bottom lip. Pt with previous dx of thrush. Was given tx - nystatin mouth wash - had some relief not much. Was advised to keep mouth moist with gum or mint. Pt using biotene to keep mouth moist. Was seen in ER for UTI. Given abx, now thrush acting up "worse than before".    HPI Patient is in today for a new problem. She presents with her daughter. She was recently treated for UTI. She is currently finishing Keflex.  She has had mouth problem since starting a new inhaler however she reports that she has switched back to her combivent.   She was changed from Spiriva to another inhaler and stopped it and went back to combivent. This has been months ago she reports.  She reports her mouth has been sore, red and irritated for the past 4 days, denies any problems swallowing, throat swelling, rash or angioedema.   Denies any urinary symptoms, only has had fatigue and this has improved substantially.   She has seen dermatology, ENT and also told it was dry mouth or yeast. She has had this multiple times in the past.  Seen in ED:  She was treated on 11/19/21 for a urinary tract infection wit keflex 500 mg BID  x 7 days.  She was also given Rocephin IV x1.   Patient  denies any fever, body aches,chills, rash, chest pain, shortness of breath, nausea, vomiting, or diarrhea.  Denies dizziness, lightheadedness, pre syncopal or syncopal episodes.     Past Medical History:  Diagnosis Date   C. difficile colitis    CAD (coronary artery disease)    a. s/p CABG 1999; b. Nuclear 10/11, no scar or ischemia, EF 68%; b. [place] 06/18/14: no evidence of infarction or ischemia, EF 77%, low risk study; c. 02/2019 MV: EF 56%, ? mild lat ischemia->low risk.   Carotid artery disease ([place])    a.  Doppler January, 2012, 40-59% bilateral; b. 02/2017 U/S: 1-39% bilat ICA stenosis.   Closed fracture pubis ([place]) 07/04/2017   COPD (chronic obstructive pulmonary disease) (HCC)    Diffuse cystic mastopathy    Diverticulitis    last colonoscopy  incomplete Jan 2011   Dizziness    stable now (Oct 2011) patient tells me question of TIA, we will obtain records   Gait abnormality    Patient complains of "wobbly gait", December, 2013   GERD (gastroesophageal reflux disease)    History of migraines    Hx of CABG    a. 3 vessel in 1999   Hyperlipidemia    Hypertension    Indigestion    3 weeks, October 2011   Kidney stones    Rheumatic fever    SCC (squamous cell carcinoma), face 08/01/2016   Sinus bradycardia    Mild, December, 2013   SOB (shortness of breath)    Syncopal episodes    after 18 holes of golf and increase hydrochlorothiazide, resolved     Past Surgical History:  Procedure Laterality Date   [place]     multiple BIL   CATARACT EXTRACTION Right 2009   CATARACT EXTRACTION Left 2011   COLONOSCOPY  9518,[id number]  Dr. Jamal Collin   CORONARY ARTERY BYPASS GRAFT  1999   esophagus stretched     TONSILLECTOMY  1939   TOTAL ABDOMINAL HYSTERECTOMY  1968   due to metrorrhagia    Family History  Problem Relation Age of Onset   Heart disease Mother    Heart disease Father    Cancer Neg Hx    Drug abuse Neg Hx    Breast cancer Neg Hx     Social History   Socioeconomic History   Marital status: Widowed    Spouse name: Not on file   Number of children: 2   Years of education: Not on file   Highest education level: Not on file  Occupational History    Employer: RETIRED  Tobacco Use   Smoking status: Former    Packs/day: 0.50    Years: 40.00    Pack years: 20.00    Types: Cigarettes    Quit date: 09/20/1983    Years since quitting: 38.2   Smokeless tobacco: Never  Vaping Use   Vaping Use: Never used   Substance and Sexual Activity   Alcohol use: Not Currently    Comment: yes, social wine   Drug use: No   Sexual activity: Never  Other Topics Concern   Not on file  Social History Narrative   She is widowed,  Lives independently , ambulates independently at baseline   Social Determinants of Health   Financial Resource Strain: Medium Risk   Difficulty of Paying Living Expenses: Somewhat hard  Food Insecurity: No Food Insecurity   Worried About Charity fundraiser in the Last Year: Never true   Espy in the Last Year: Never true  Transportation Needs: No Transportation Needs   Lack of Transportation (Medical): No   Lack of Transportation (Non-Medical): No  Physical Activity: Not on file  Stress: Not on file  Social Connections: Not on file  Intimate Partner Violence: Not At Risk   Fear of Current or Ex-Partner: No   Emotionally Abused: No   Physically Abused: No   Sexually Abused: No    Outpatient Medications Prior to Visit  Medication Sig Dispense Refill   ALPRAZolam (XANAX) 0.25 MG tablet Take 1 tablet (0.25 mg total) by mouth at bedtime as needed. 30 tablet 5   atorvastatin (LIPITOR) 40 MG tablet TAKE 1 TABLET BY MOUTH DAILY 90 tablet 1   Bacillus Coagulans-Inulin (PROBIOTIC) 1-250 BILLION-MG CAPS Take 1 capsule by mouth daily. 30 capsule 0   cephALEXin (KEFLEX) 500 MG capsule Take 1 capsule (500 mg total) by mouth 2 (two) times daily. 14 capsule 0   clopidogrel (PLAVIX) 75 MG tablet TAKE 1 TABLET BY MOUTH DAILY 90 tablet 0   Cranberry 125 MG TABS Take 1 tablet by mouth daily.     esomeprazole (NEXIUM) 40 MG capsule Take 40 mg by mouth daily at 12 noon.     furosemide (LASIX) 20 MG tablet TAKE 1 TABLET BY MOUTH 1-2 DAYS PER WEEKAS NEEDED 10 tablet 0   Ipratropium-Albuterol (COMBIVENT RESPIMAT) 20-100 MCG/ACT AERS respimat INHALE ONE PUFF EVERY SIX HOURS 4 g 5   isosorbide mononitrate (IMDUR) 60 MG 24 hr tablet TAKE 1 AND 1/2 TABLETS BY MOUTH TWO TIMES DAILY 270  tablet 3   losartan (COZAAR) 100 MG tablet TAKE 1 TABLET BY MOUTH DAILY 90 tablet 0   nitroGLYCERIN (NITROSTAT) 0.4 MG SL tablet PLACE 1 TABLET UNDER TONGUE EVERY 5 MIN AS NEEDED FOR CHEST PAIN IF NO RELIEF  IN15 MIN CALL 911 (MAX 3 TABS) 25 tablet 3   Probiotic Product (PROBIOTIC PO) Take 1 capsule by mouth daily.     ranolazine (RANEXA) 500 MG 12 hr tablet TAKE 1 TABLET BY MOUTH TWICE DAILY 180 tablet 0   vitamin B-12 (CYANOCOBALAMIN) 500 MCG tablet Take 500 mcg by mouth daily.     propranolol (INDERAL) 20 MG tablet TAKE ONE TABLET 3 TIMES DAILY AS NEEDED FOR TACHYCARDIA (Patient not taking: Reported on 11/22/2021) 270 tablet 3   No facility-administered medications prior to visit.    No Known Allergies  Review of Systems     Objective:    Physical Exam Constitutional:      General: She is not in acute distress.    Appearance: Normal appearance. She is not ill-appearing, toxic-appearing or diaphoretic.  HENT:     Head: Normocephalic and atraumatic.     Jaw: There is normal jaw occlusion.     Right Ear: External ear normal.     Left Ear: External ear normal.     Nose: Nose normal.     Right Sinus: No maxillary sinus tenderness or frontal sinus tenderness.     Left Sinus: No maxillary sinus tenderness or frontal sinus tenderness.     Mouth/Throat:     Mouth: Mucous membranes are moist. Oral lesions (mild ehite scattered exudate on tongue, bucal mucosa) present. No angioedema.     Tongue: Lesions (scant scattered white exudate and erythema) present.     Pharynx: Oropharynx is clear. Uvula midline. No oropharyngeal exudate or posterior oropharyngeal erythema.     Tonsils: No tonsillar exudate or tonsillar abscesses. 0 on the right. 0 on the left.  Eyes:     Extraocular Movements: Extraocular movements intact.     Conjunctiva/sclera: Conjunctivae normal.     Pupils: Pupils are equal, round, and reactive to light.  Cardiovascular:     Rate and Rhythm: Normal rate and regular rhythm.      Pulses: Normal pulses.     Heart sounds: Normal heart sounds. No murmur heard.   No friction rub. No gallop.  Pulmonary:     Effort: Pulmonary effort is normal. No respiratory distress.     Breath sounds: Normal breath sounds. No stridor. No wheezing, rhonchi or rales.  Chest:     Chest wall: No tenderness.  Abdominal:     General: There is no distension.     Palpations: Abdomen is soft.     Tenderness: There is no abdominal tenderness.  Musculoskeletal:        General: Normal range of motion.     Cervical back: Normal range of motion and neck supple.     Right lower leg: No edema.     Left lower leg: No edema.  Skin:    General: Skin is warm.     Findings: No erythema or rash.  Neurological:     Mental Status: She is alert and oriented to person, place, and time.     Gait: Gait normal.  Psychiatric:        Mood and Affect: Mood normal.        Behavior: Behavior normal.        Thought Content: Thought content normal.        Judgment: Judgment normal.    BP 140/72 (BP Location: Left Arm, Patient Position: Sitting, Cuff Size: Small)    Pulse 88    Temp 98 F (36.7 C) (Oral)    Ht 5' (1.524 m)  Wt 129 lb (58.5 kg)    SpO2 96%    BMI 25.19 kg/m  Wt Readings from Last 3 Encounters:  11/22/21 129 lb (58.5 kg)  11/18/21 129 lb (58.5 kg)  10/15/21 119 lb 6 oz (54.1 kg)    There are no preventive care reminders to display for this patient.  There are no preventive care reminders to display for this patient.   Lab Results  Component Value Date   TSH 2.47 07/07/2021   Lab Results  Component Value Date   WBC 6.2 11/18/2021   HGB 12.7 11/18/2021   HCT 39.0 11/18/2021   MCV 104.3 (H) 11/18/2021   PLT 183 11/18/2021   Lab Results  Component Value Date   NA 135 11/18/2021   K 4.3 11/18/2021   CO2 28 11/18/2021   GLUCOSE 108 (H) 11/18/2021   BUN 21 11/18/2021   CREATININE 1.39 (H) 11/18/2021   BILITOT 0.7 11/18/2021   ALKPHOS 61 11/18/2021   AST 22 11/18/2021    ALT 13 11/18/2021   PROT 6.2 (L) 11/18/2021   ALBUMIN 3.8 11/18/2021   CALCIUM 8.7 (L) 11/18/2021   ANIONGAP 10 11/18/2021   GFR 32.02 (L) 07/07/2021   Lab Results  Component Value Date   CHOL 128 04/02/2021   Lab Results  Component Value Date   HDL 48 04/02/2021   Lab Results  Component Value Date   LDLCALC 58 04/02/2021   Lab Results  Component Value Date   TRIG 108 04/02/2021   Lab Results  Component Value Date   CHOLHDL 2.7 04/02/2021   Lab Results  Component Value Date   HGBA1C 5.8 01/30/2017       Assessment & Plan:   Problem List Items Addressed This Visit   None Visit Diagnoses     Oral thrush    -  Primary   Relevant Medications   clotrimazole (MYCELEX) 10 MG troche   nystatin (MYCOSTATIN) 100000 UNIT/ML suspension   Other Relevant Orders   B12 and Folate Panel      Given her recurrence of oral thrush like symptoms and recent antibiotic use, will give her nystatin mouth wash she can alternate with clotrimazole troche.   Avoid mouth washes with alcohol.  Stay hydrated.   Recheck urine 1 week after completing antibiotics.   Nystatin, swish , hold as long as can and spit.  Should not have any worsening symptoms return or seek care if she does.   Do not suspect antibiotic allergy given history and exam as well as past history.  Change toothbrush once treated.   Return in about 2 weeks (around 12/06/2021), or if symptoms worsen or fail to improve, for at any time for any worsening symptoms, Go to Emergency room/ urgent care if worse.   Meds ordered this encounter  Medications   clotrimazole (MYCELEX) 10 MG troche    Sig: Take 1 tablet (10 mg total) by mouth 3 (three) times daily for 5 days. Dissolve TID    Dispense:  15 tablet    Refill:  0   nystatin (MYCOSTATIN) 100000 UNIT/ML suspension    Sig: Take 5 mLs (500,000 Units total) by mouth 4 (four) times daily.    Dispense:  60 mL    Refill:  0   Check B12 TODAY.   RECHECK URINE MICRO  AND URINE CULTURE IN ONE WEEK ADVISED TO BE SURE CLEARED.  Red Flags discussed. The patient was given clear instructions to go to ER or return to medical center  if any red flags develop, symptoms do not improve, worsen or new problems develop. They verbalized understanding.   Marcille Buffy, FNP

## 2021-11-22 NOTE — Telephone Encounter (Signed)
Spoke with pt and moved her appt up to today at 2:30. Pt gave a verbal understanding.  ?

## 2021-11-23 LAB — B12 AND FOLATE PANEL
Folate: 8 ng/mL (ref >5.9)
Vitamin B-12: >1504 pg/mL — ABNORMAL HIGH (ref 211–911)

## 2021-11-23 NOTE — Progress Notes (Signed)
B12 is elevated. I recommend changing her B12 500 mcg once daily to taking it only every other day and have lab rechecked with PCP in 3 months B12 level.

## 2021-11-24 ENCOUNTER — Ambulatory Visit: Payer: Medicare Other | Admitting: Internal Medicine

## 2021-11-26 ENCOUNTER — Ambulatory Visit (INDEPENDENT_AMBULATORY_CARE_PROVIDER_SITE_OTHER): Payer: Medicare Other | Admitting: *Deleted

## 2021-11-26 DIAGNOSIS — Z87440 Personal history of urinary (tract) infections: Secondary | ICD-10-CM

## 2021-11-26 DIAGNOSIS — J449 Chronic obstructive pulmonary disease, unspecified: Secondary | ICD-10-CM

## 2021-11-26 DIAGNOSIS — I10 Essential (primary) hypertension: Secondary | ICD-10-CM

## 2021-11-26 NOTE — Patient Instructions (Addendum)
Visit Information ? ?Thank you for taking time to visit with me today. Please don't hesitate to contact me if I can be of assistance to you before our next scheduled telephone appointment. ? ?Following are the goals we discussed today:  ?Identify and avoid  triggers ?Identify and remove indoor air pollutants ?Limit outdoor activity during cold weather  ?Rinse mouth after inhaler use ?Continue dry mouth treatment with sips of water, hard candy, chewing gum ?check blood pressure daily ?Write blood pressure results in a log  ?Take log to medical appointments for provider review ?Low salt diet ?Fall precautions and preventions ?Drink plenty of fluids ? ?Our next appointment is by telephone on 3/17 at 1100 ? ?Please call the care guide team at [phone] if you need to cancel or reschedule your appointment.  ? ?If you are experiencing a Mental Health or [place] or need someone to talk to, please call the Suicide and Crisis Lifeline: 988 ?call the [place] National Suicide Prevention Lifeline: 737-164-9006 or TTY: (629) 198-0827 TTY (313)026-8481) to talk to a trained counselor ?call 1-800-273-TALK (toll free, 24 hour hotline) ?call 911  ? ?Patient verbalizes understanding of instructions and care plan provided today and agrees to view in [place]. Active MyChart status confirmed with patient.   ? ?Hubert Azure RN, MSN ?RN Care Management Coordinator ?[place] ?[phone] ?[email or web address] ?  ?

## 2021-11-26 NOTE — Chronic Care Management (AMB) (Cosign Needed)
Chronic Care Management   CCM RN Visit Note  11/26/2021 Name: [name] MRN: [id number] DOB: [birth year]  Subjective: [name] is a 88 y.o. year old female who is a primary care patient of Tullo, Aris Everts, MD. The care management team was consulted for assistance with disease management and care coordination needs.    Engaged with patient by telephone for follow up visit in response to provider referral for case management and/or care coordination services.   Consent to Services:  The patient was given information about Chronic Care Management services, agreed to services, and gave verbal consent prior to initiation of services.  Please see initial visit note for detailed documentation.   Patient agreed to services and verbal consent obtained.   Assessment: Review of patient past medical history, allergies, medications, health status, including review of consultants reports, laboratory and other test data, was performed as part of comprehensive evaluation and provision of chronic care management services.   SDOH (Social Determinants of Health) assessments and interventions performed:    CCM Care Plan  No Known Allergies  Outpatient Encounter Medications as of 11/26/2021  Medication Sig Note   ALPRAZolam (XANAX) 0.25 MG tablet Take 1 tablet (0.25 mg total) by mouth at bedtime as needed.    atorvastatin (LIPITOR) 40 MG tablet TAKE 1 TABLET BY MOUTH DAILY    Bacillus Coagulans-Inulin (PROBIOTIC) 1-250 BILLION-MG CAPS Take 1 capsule by mouth daily.    cephALEXin (KEFLEX) 500 MG capsule Take 1 capsule (500 mg total) by mouth 2 (two) times daily.    clopidogrel (PLAVIX) 75 MG tablet TAKE 1 TABLET BY MOUTH DAILY    clotrimazole (MYCELEX) 10 MG troche Take 1 tablet (10 mg total) by mouth 3 (three) times daily for 5 days. Dissolve TID    Cranberry 125 MG TABS Take 1 tablet by mouth daily.    esomeprazole (NEXIUM) 40 MG capsule Take 40 mg by mouth daily at 12 noon.     furosemide (LASIX) 20 MG tablet TAKE 1 TABLET BY MOUTH 1-2 DAYS PER WEEKAS NEEDED    Ipratropium-Albuterol (COMBIVENT RESPIMAT) 20-100 MCG/ACT AERS respimat INHALE ONE PUFF EVERY SIX HOURS    isosorbide mononitrate (IMDUR) 60 MG 24 hr tablet TAKE 1 AND 1/2 TABLETS BY MOUTH TWO TIMES DAILY    losartan (COZAAR) 100 MG tablet TAKE 1 TABLET BY MOUTH DAILY    nitroGLYCERIN (NITROSTAT) 0.4 MG SL tablet PLACE 1 TABLET UNDER TONGUE EVERY 5 MIN AS NEEDED FOR CHEST PAIN IF NO RELIEF IN15 MIN CALL 911 (MAX 3 TABS)    nystatin (MYCOSTATIN) 100000 UNIT/ML suspension Take 5 mLs (500,000 Units total) by mouth 4 (four) times daily.    Probiotic Product (PROBIOTIC PO) Take 1 capsule by mouth daily.    propranolol (INDERAL) 20 MG tablet TAKE ONE TABLET 3 TIMES DAILY AS NEEDED FOR TACHYCARDIA (Patient not taking: Reported on 11/22/2021) 03/15/2021: Hasn't needed in long time per patient   ranolazine (RANEXA) 500 MG 12 hr tablet TAKE 1 TABLET BY MOUTH TWICE DAILY    vitamin B-12 (CYANOCOBALAMIN) 500 MCG tablet Take 500 mcg by mouth daily.    No facility-administered encounter medications on file as of 11/26/2021.    Patient Active Problem List   Diagnosis Date Noted   Mucosal irritation of oral cavity 11/22/2021   Oral thrush 11/22/2021   Mild cognitive impairment with memory loss 10/12/2021   Hyperparathyroidism due to renal insufficiency ([place]) 02/02/2021   CKD (chronic kidney disease) stage 3, GFR 30-59 ml/min (HCC) 01/15/2021  History of esophageal stricture 04/08/2020   Leg swelling 03/09/2020   Atypical chest pain 01/10/2020   B12 deficiency 01/09/2020   Mild malnutrition ([place]) 16/06/9603   Diastolic CHF, chronic ([place]) 03/25/2019   Vitamin D deficiency 03/25/2019   S/P excision of skin lesion, follow-up exam 12/26/2017   Impingement syndrome of left shoulder region 07/04/2017   Allergic rhinitis 12/31/2016   Osteoporosis of forearm 02/13/2016   Insomnia secondary to anxiety 07/12/2015   Anxiety  disorder 07/12/2015   Essential (primary) hypertension 10/07/2014   Sinus bradycardia    Hardening of the aorta (main artery of the heart) ([place]) 06/26/2014   History of Clostridium difficile colitis 10/20/2013   History of pancreatitis 10/20/2013   Low back pain 10/04/2013   Do not resuscitate discussion 02/07/2013   Chest pain at rest 12/26/2011   COPD, moderate ([place]) 12/05/2011   Presence of aortocoronary bypass graft    Stage 3b chronic kidney disease ([place]) 09/25/2011   Hyperlipidemia    SOB (shortness of breath)    Coronary artery disease of native artery of native heart with stable angina pectoris ([place])    Carotid artery disease ([place])     Conditions to be addressed/monitored:HTN and COPD  Care Plan : COPD (Adult)  Updates made by Leona Singleton, RN since 11/26/2021 12:00 AM     Problem: Symptom Exacerbation (COPD/HTN)   Priority: Medium     Long-Range Goal: Symptom Exacerbation Prevented or Minimized  (COPD/HTN)   Start Date: 03/15/2021  Expected End Date: 03/15/2022  Priority: Medium  Note:   Current Barriers:  Knowledge deficits related to basic understanding of COPD disease process Knowledge deficits related to basic COPD self care/management history of COPD; currently recovering from uti.  Completed antibiotics today.  Still has the oral thrush, encouraged to continue treatment provided by provider and to add yogurt to her diet, contact provider if mouth does not get better.  Reports she continues to be real weak  confirmed she has assistance at home.  Reports blood pressures have ranged 160/70'S.  Denies any falls or sob.  Discussed drinking more fluids to increase hydration Case Manager Clinical Goal(s): patient will report using inhalers as prescribed including rinsing mouth after use patient will verbalize understanding of COPD action plan and when to seek appropriate levels of medical care patient will verbalize basic understanding of COPD disease process and self  care activities   COPD: (Status: Goal on Track (progressing): YES.) Long Term Goal  Interventions:  Collaboration with [name], MD regarding development and update of comprehensive plan of care as evidenced by provider attestation and co-signature Inter-disciplinary care team collaboration (see longitudinal plan of care) Provided patient with basic written and verbal COPD education on self care/management/and exacerbation prevention  Provided patient with COPD action plan and reinforced importance of daily self assessment Provided instruction about proper use of medications used for management of COPD including inhalers Advised patient to self assesses COPD action plan zone and make appointment with provider if in the yellow zone for 48 hours without improvement. Provided education about and advised patient to utilize infection prevention strategies to reduce risk of respiratory infection  Healthy lifestyle promoted and encouraged to develop a rescue plan Rescue (action) plan reviewed and signs/symptoms of worsening disease assessed; encouraged to follow rescue plan if symptoms flare-up Discussed eliminating symptom triggers at home Encouraged to attend all scheduled provider appointments   Instructed to call provider office for new concerns or questions Reviewed and assessed barriers and manage  adherence, including inhaler technique and persistent trigger exposure; encourage adherence, even when symptoms are controlled or infrequent Encouraged to monitor for signs/symptoms of psychosocial concerns, such as shortness of breath-anxiety cycle or depression that may impact stability of symptoms  Hypertension: (Status: Goal on track: NO.) Long Term Goal  Last practice recorded BP readings:  BP Readings from Last 3 Encounters:  07/07/21 132/62  05/17/21 130/76  05/04/21 (!) 129/53  Most recent eGFR/CrCl: No results found for: EGFR  No components found for: CRCL  Evaluation of current  treatment plan related to hypertension self management and patient's adherence to plan as established by provider;   Provided education to patient re: stroke prevention, s/s of heart attack and stroke; Reviewed prescribed diet low salt Reviewed medications with patient and discussed importance of compliance;  Discussed plans with patient for ongoing care management follow up and provided patient with direct contact information for care management team; Advised patient, providing education and rationale, to monitor blood pressure daily and record, calling PCP for findings outside established parameters;  Discussed complications of poorly controlled blood pressure such as heart disease, stroke, circulatory complications, vision complications, kidney impairment, sexual dysfunction;   Discussed importance of monitoring blood pressure daily and notifying provider for sustained elevations  Patient Goals/Self-Care Activities: : Identify and avoid  triggers Identify and remove indoor air pollutants Limit outdoor activity during cold weather  Rinse mouth after inhaler use Continue dry mouth treatment with sips of water, hard candy, chewing gum check blood pressure daily Write blood pressure results in a log  Take log to medical appointments for provider review Low salt diet Fall precautions and preventions Drink plenty of fluids Follow Up Plan: The care management team will reach out to the patient again over the next 45 business days.       Plan:The care management team will reach out to the patient again over the next 20 days.  Hubert Azure RN, MSN RN Care Management Coordinator [place] [phone] [email or web address]

## 2021-11-29 ENCOUNTER — Encounter: Payer: Self-pay | Admitting: Emergency Medicine

## 2021-11-29 ENCOUNTER — Other Ambulatory Visit: Payer: Self-pay

## 2021-11-29 ENCOUNTER — Emergency Department
Admission: EM | Admit: 2021-11-29 | Discharge: 2021-11-29 | Disposition: A | Discharge status: Home / Self Care | Payer: Medicare Other | Attending: Emergency Medicine | Admitting: Emergency Medicine

## 2021-11-29 DIAGNOSIS — I10 Essential (primary) hypertension: Secondary | ICD-10-CM | POA: Insufficient documentation

## 2021-11-29 DIAGNOSIS — Z20822 Contact with and (suspected) exposure to covid-19: Secondary | ICD-10-CM | POA: Diagnosis not present

## 2021-11-29 DIAGNOSIS — I2510 Atherosclerotic heart disease of native coronary artery without angina pectoris: Secondary | ICD-10-CM | POA: Diagnosis not present

## 2021-11-29 DIAGNOSIS — J449 Chronic obstructive pulmonary disease, unspecified: Secondary | ICD-10-CM | POA: Diagnosis not present

## 2021-11-29 DIAGNOSIS — R5383 Other fatigue: Secondary | ICD-10-CM | POA: Insufficient documentation

## 2021-11-29 DIAGNOSIS — R531 Weakness: Secondary | ICD-10-CM | POA: Diagnosis not present

## 2021-11-29 DIAGNOSIS — N289 Disorder of kidney and ureter, unspecified: Secondary | ICD-10-CM | POA: Insufficient documentation

## 2021-11-29 LAB — URINALYSIS, ROUTINE W REFLEX MICROSCOPIC
Bacteria, UA: NONE SEEN
Bilirubin Urine: NEGATIVE
Glucose, UA: NEGATIVE mg/dL
Hgb urine dipstick: NEGATIVE
Ketones, ur: NEGATIVE mg/dL
Nitrite: NEGATIVE
Protein, ur: 100 mg/dL — AB
Specific Gravity, Urine: 1.02 (ref 1.005–1.030)
pH: 6 (ref 5.0–8.0)

## 2021-11-29 LAB — CBC
HCT: 40.6 % (ref 36.0–46.0)
Hemoglobin: 12.9 g/dL (ref 12.0–15.0)
MCH: 33.5 pg (ref 26.0–34.0)
MCHC: 31.8 g/dL (ref 30.0–36.0)
MCV: 105.5 fL — ABNORMAL HIGH (ref 80.0–100.0)
Platelets: 231 10*3/uL (ref 150–400)
RBC: 3.85 MIL/uL — ABNORMAL LOW (ref 3.87–5.11)
RDW: 11.9 % (ref 11.5–15.5)
WBC: 6.8 10*3/uL (ref 4.0–10.5)
nRBC: 0 % (ref 0.0–0.2)

## 2021-11-29 LAB — BASIC METABOLIC PANEL
Anion gap: 7 (ref 5–15)
BUN: 19 mg/dL (ref 8–23)
CO2: 28 mmol/L (ref 22–32)
Calcium: 8.6 mg/dL — ABNORMAL LOW (ref 8.9–10.3)
Chloride: 101 mmol/L (ref 98–111)
Creatinine, Ser: 1.43 mg/dL — ABNORMAL HIGH (ref 0.44–1.00)
GFR, Estimated: 34 mL/min — ABNORMAL LOW (ref >60)
Glucose, Bld: 129 mg/dL — ABNORMAL HIGH (ref 70–99)
Potassium: 4.2 mmol/L (ref 3.5–5.1)
Sodium: 136 mmol/L (ref 135–145)

## 2021-11-29 LAB — RESP PANEL BY RT-PCR (FLU A&B, COVID) ARPGX2
Influenza A by PCR: NEGATIVE
Influenza B by PCR: NEGATIVE
SARS Coronavirus 2 by RT PCR: NEGATIVE

## 2021-11-29 LAB — TROPONIN I (HIGH SENSITIVITY): Troponin I (High Sensitivity): 12 ng/L (ref <18)

## 2021-11-29 LAB — TSH: TSH: 3.129 u[IU]/mL (ref 0.350–4.500)

## 2021-11-29 MED ORDER — SODIUM CHLORIDE 0.9 % IV BOLUS
1000.0000 mL | Freq: Once | INTRAVENOUS | Status: AC
  Administered 2021-11-29: 1000 mL via INTRAVENOUS

## 2021-11-29 NOTE — ED Triage Notes (Signed)
Pt to ED via POV with c/o weakness, she was here on the 3rd for weakness and was found to have a UTI and dehydration. She states that she is not feeling better. Three days ago she started to get weak.  ?

## 2021-11-29 NOTE — ED Provider Notes (Signed)
Coronavirus test negative. ? ?TSH normal ? ?Patient and her family comfortable plan to discharge and continue follow-up with Dr. Derrel Nip.  She is awake alert oriented without distress at this time, complains of ongoing feeling of general malaise.  Able to ambulate back and forth to urinal. ? ?Appears appropriate for discharge at this time ?  ?Delman Kitten, MD ?11/29/21 1658 ? ?

## 2021-11-29 NOTE — ED Provider Notes (Signed)
? ?[facility] ?Provider Note ? ? ? Event Date/Time  ? First MD Initiated Contact with Patient 11/29/21 1322   ?  (approximate) ? ?History  ? ?Chief Complaint: Weakness ? ?HPI ? ?[name] is a 88 y.o. female with a past medical history of CAD, COPD, hypertension, hyperlipidemia, presents to the emergency department for generalized fatigue/weakness.  According to the patient over the past week or 2 she has been feeling extremely weak.  Patient was seen here a little over a week ago and diagnosed with urinary tract infection.  Patient states she is finished her antibiotics but continues to feel very weak so she came to the emergency department for evaluation.  Patient denies any chest pain or shortness of breath.  Denies any abdominal pain nausea vomiting or diarrhea.  Denies any shortness of breath but does state occasional cough/congestion which she attributes to allergies.  No dysuria. ? ?Physical Exam  ? ?Triage Vital Signs: ?ED Triage Vitals  ?Enc Vitals Group  ?   BP 11/29/21 1201 (!) 163/68  ?   Pulse Rate 11/29/21 1201 81  ?   Resp 11/29/21 1201 20  ?   Temp 11/29/21 1201 (!) 96.3 ?F (35.7 ?C)  ?   Temp Source 11/29/21 1201 Axillary  ?   SpO2 11/29/21 1201 97 %  ?   Weight 11/29/21 1202 129 lb (58.5 kg)  ?   Height 11/29/21 1202 5' (1.524 m)  ?   Head Circumference --   ?   Peak Flow --   ?   Pain Score --   ?   Pain Loc --   ?   Pain Edu? --   ?   Excl. in [place]? --   ? ? ?Most recent vital signs: ?Vitals:  ? 11/29/21 1201  ?BP: (!) 163/68  ?Pulse: 81  ?Resp: 20  ?Temp: (!) 96.3 ?F (35.7 ?C)  ?SpO2: 97%  ? ? ?General: Awake, no distress.  ?CV:  Good peripheral perfusion.  Regular rate and rhythm  ?Resp:  Normal effort.  Equal breath sounds bilaterally.  ?Abd:  No distention.  Soft, nontender.  No rebound or guarding. ? ? ?ED Results / Procedures / Treatments  ? ?EKG ? ?EKG viewed and interpreted by myself shows a sinus rhythm 86 bpm with a narrow QRS, normal axis, normal intervals,  no concerning ST changes. ? ?MEDICATIONS ORDERED IN ED: ?Medications  ?sodium chloride 0.9 % bolus 1,000 mL (has no administration in time range)  ? ? ? ?IMPRESSION / MDM / ASSESSMENT AND PLAN / ED COURSE  ?I reviewed the triage vital signs and the nursing notes. ? ?Patient presents to the emergency department for generalized weakness/fatigue over the past 2 weeks or so.  Patient was diagnosed with UTI last week but continues to state fatigue/weakness.  Largely negative review of systems otherwise.  Patient has no focal complaints.  Patient's lab work is thus far largely [place].  Normal CBC including normal H&H.  Chemistry is reassuring with mild renal insufficiency largely unchanged from baseline/historical values.  Urinalysis does show some white cells but no bacteria.  Urine culture has been sent.  I have also added on a troponin as well as a COVID swab as a precaution.  We will IV hydrate while awaiting results.  Overall exam and vitals are reassuring. ? ?Lab work is reassuring.  Chemistry is normal besides mild renal insufficiency largely unchanged.  CBC is normal urinalysis shows some white cells but no bacteria.  Urine culture has been added.  Troponin has resulted negative.  I have added on a TSH and COVID/flu.  I have updated the patient and daughter on the patient's findings.  If the COVID/flu and TSH are normal anticipate likely discharge home.  Patient care signed out to oncoming provider. ? ?FINAL CLINICAL IMPRESSION(S) / ED DIAGNOSES  ? ?Generalized weakness ? ? ?Note:  This document was prepared using Dragon voice recognition software and may include unintentional dictation errors. ?  ?Harvest Dark, MD ?11/29/21 1530 ? ?

## 2021-11-29 NOTE — ED Notes (Signed)
Pt up to bathroom with assistance.  Family with pt  md in with pt also.   ?

## 2021-12-01 DIAGNOSIS — R202 Paresthesia of skin: Secondary | ICD-10-CM | POA: Diagnosis not present

## 2021-12-02 LAB — URINE CULTURE: Culture: 10000 — AB

## 2021-12-03 ENCOUNTER — Ambulatory Visit: Payer: Medicare Other | Admitting: *Deleted

## 2021-12-03 DIAGNOSIS — J449 Chronic obstructive pulmonary disease, unspecified: Secondary | ICD-10-CM

## 2021-12-03 DIAGNOSIS — Z87440 Personal history of urinary (tract) infections: Secondary | ICD-10-CM

## 2021-12-03 DIAGNOSIS — I10 Essential (primary) hypertension: Secondary | ICD-10-CM

## 2021-12-03 NOTE — Patient Instructions (Addendum)
Visit Information ? ?Thank you for taking time to visit with me today. Please don't hesitate to contact me if I can be of assistance to you before our next scheduled telephone appointment. ? ?Following are the goals we discussed today:  ? Identify and avoid  triggers ? Identify and remove indoor air pollutants ? Limit outdoor activity during cold weather  ? Rinse mouth after inhaler use ? Continue dry mouth treatment with sips of water, hard candy, chewing gum ? check blood pressure daily ? Write blood pressure results in a log  ? Take log to medical appointments for provider review ? Low salt diet ? Fall precautions and preventions ? Drink plenty of fluids ? ?Our next appointment is by telephone on 3/29 at 1400 ? ?Please call the care guide team at [phone] if you need to cancel or reschedule your appointment.  ? ?If you are experiencing a Mental Health or [place] or need someone to talk to, please call the Suicide and Crisis Lifeline: 988 ?call the [place] National Suicide Prevention Lifeline: 971 675 6506 or TTY: 801-441-0944 TTY 551-238-7231) to talk to a trained counselor ?call 1-800-273-TALK (toll free, 24 hour hotline) ?call 911  ? ?Patient verbalizes understanding of instructions and care plan provided today and agrees to view in [place]. Active MyChart status confirmed with patient.   ? ?Hubert Azure RN, MSN ?RN Care Management Coordinator ?[place] ?[phone] ?[email or web address] ? ?

## 2021-12-03 NOTE — Chronic Care Management (AMB) (Signed)
?Chronic Care Management  ? ?CCM RN Visit Note ? ?12/03/2021 ?Name: [name] MRN: [id number] DOB: 02/19/1926 ? ?Subjective: ?[name] is a 88 y.o. year old female who is a primary care patient of Tullo, Aris Everts, MD. The care management team was consulted for assistance with disease management and care coordination needs.   ? ?Engaged with patient by telephone for follow up visit in response to provider referral for case management and/or care coordination services.  ? ?Consent to Services:  ?The patient was given information about Chronic Care Management services, agreed to services, and gave verbal consent prior to initiation of services.  Please see initial visit note for detailed documentation.  ? ?Patient agreed to services and verbal consent obtained.  ? ?Assessment: Review of patient past medical history, allergies, medications, health status, including review of consultants reports, laboratory and other test data, was performed as part of comprehensive evaluation and provision of chronic care management services.  ? ?SDOH (Social Determinants of Health) assessments and interventions performed:   ? ?CCM Care Plan ? ?No Known Allergies ? ?Outpatient Encounter Medications as of 12/03/2021  ?Medication Sig Note  ? ALPRAZolam (XANAX) 0.25 MG tablet Take 1 tablet (0.25 mg total) by mouth at bedtime as needed.   ? atorvastatin (LIPITOR) 40 MG tablet TAKE 1 TABLET BY MOUTH DAILY   ? Bacillus Coagulans-Inulin (PROBIOTIC) 1-250 BILLION-MG CAPS Take 1 capsule by mouth daily.   ? cephALEXin (KEFLEX) 500 MG capsule Take 1 capsule (500 mg total) by mouth 2 (two) times daily.   ? clopidogrel (PLAVIX) 75 MG tablet TAKE 1 TABLET BY MOUTH DAILY   ? Cranberry 125 MG TABS Take 1 tablet by mouth daily.   ? esomeprazole (NEXIUM) 40 MG capsule Take 40 mg by mouth daily at 12 noon.   ? furosemide (LASIX) 20 MG tablet TAKE 1 TABLET BY MOUTH 1-2 DAYS PER WEEKAS NEEDED   ? Ipratropium-Albuterol (COMBIVENT RESPIMAT)  20-100 MCG/ACT AERS respimat INHALE ONE PUFF EVERY SIX HOURS   ? isosorbide mononitrate (IMDUR) 60 MG 24 hr tablet TAKE 1 AND 1/2 TABLETS BY MOUTH TWO TIMES DAILY   ? losartan (COZAAR) 100 MG tablet TAKE 1 TABLET BY MOUTH DAILY   ? nitroGLYCERIN (NITROSTAT) 0.4 MG SL tablet PLACE 1 TABLET UNDER TONGUE EVERY 5 MIN AS NEEDED FOR CHEST PAIN IF NO RELIEF IN15 MIN CALL 911 (MAX 3 TABS)   ? nystatin (MYCOSTATIN) 100000 UNIT/ML suspension Take 5 mLs (500,000 Units total) by mouth 4 (four) times daily.   ? Probiotic Product (PROBIOTIC PO) Take 1 capsule by mouth daily.   ? propranolol (INDERAL) 20 MG tablet TAKE ONE TABLET 3 TIMES DAILY AS NEEDED FOR TACHYCARDIA (Patient not taking: Reported on 11/22/2021) 03/15/2021: Hasn't needed in long time per patient  ? ranolazine (RANEXA) 500 MG 12 hr tablet TAKE 1 TABLET BY MOUTH TWICE DAILY   ? vitamin B-12 (CYANOCOBALAMIN) 500 MCG tablet Take 500 mcg by mouth daily.   ? ?No facility-administered encounter medications on file as of 12/03/2021.  ? ? ?Patient Active Problem List  ? Diagnosis Date Noted  ? Mucosal irritation of oral cavity 11/22/2021  ? Oral thrush 11/22/2021  ? Mild cognitive impairment with memory loss 10/12/2021  ? Hyperparathyroidism due to renal insufficiency ([place]) 02/02/2021  ? CKD (chronic kidney disease) stage 3, GFR 30-59 ml/min (HCC) 01/15/2021  ? History of esophageal stricture 04/08/2020  ? Leg swelling 03/09/2020  ? Atypical chest pain 01/10/2020  ? B12 deficiency 01/09/2020  ?  Mild malnutrition ([place]) 03/25/2019  ? Diastolic CHF, chronic ([place]) 03/25/2019  ? Vitamin D deficiency 03/25/2019  ? S/P excision of skin lesion, follow-up exam 12/26/2017  ? Impingement syndrome of left shoulder region 07/04/2017  ? Allergic rhinitis 12/31/2016  ? Osteoporosis of forearm 02/13/2016  ? Insomnia secondary to anxiety 07/12/2015  ? Anxiety disorder 07/12/2015  ? Essential (primary) hypertension 10/07/2014  ? Sinus bradycardia   ? Hardening of the aorta (main artery of the  heart) ([place]) 06/26/2014  ? History of Clostridium difficile colitis 10/20/2013  ? History of pancreatitis 10/20/2013  ? Low back pain 10/04/2013  ? Do not resuscitate discussion 02/07/2013  ? Chest pain at rest 12/26/2011  ? COPD, moderate ([place]) 12/05/2011  ? Presence of aortocoronary bypass graft   ? Stage 3b chronic kidney disease ([place]) 09/25/2011  ? Hyperlipidemia   ? SOB (shortness of breath)   ? Coronary artery disease of native artery of native heart with stable angina pectoris ([place])   ? Carotid artery disease ([place])   ? ? ?Conditions to be addressed/monitored:HTN and COPD ? ?Care Plan : COPD (Adult)  ?Updates made by Leona Singleton, RN since 12/03/2021 12:00 AM  ?  ? ?Problem: Symptom Exacerbation (COPD/HTN)   ?Priority: Medium  ?  ? ?Long-Range Goal: Symptom Exacerbation Prevented or Minimized  (COPD/HTN)   ?Start Date: 03/15/2021  ?Expected End Date: 03/15/2022  ?Priority: Medium  ?Note:   ?Current Barriers:  ?Knowledge deficits related to basic understanding of COPD disease process ?Knowledge deficits related to basic COPD self care/management history of COPD; currently recovering from uti.  Completed antibiotics today.  Still has the oral thrush, encouraged to continue treatment provided by provider and to add yogurt to her diet, contact provider if mouth does not get better.  Reports she continues to be real weak  confirmed she has assistance at home.  Reports blood pressures have ranged 160/70'S.  Denies any falls or sob.  Discussed drinking more fluids to increase hydration ?3/17--recent ed visit for continued weakness.  Denies fall or sob.  Now reporting no one stays overnight with her, but if need be her daughter can.  Encouraged to discuss with daughter.  Continues to complain of mouth burning and sore even with the various mouth washes.  Reports trying to stay hydrated but continues to be very weak, hard to care for self.  Rncm assisted patient to call pcp office for f/u appointment  to discuss hh  vs op therapy ?Case Manager Clinical Goal(s): ?patient will report using inhalers as prescribed including rinsing mouth after use ?patient will verbalize understanding of COPD action plan and when to seek appropriate levels of medical care ?patient will verbalize basic understanding of COPD disease process and self care activities  ? ?COPD: (Status: Goal on Track (progressing): YES.) Long Term Goal  ?Interventions:  ?Collaboration with [name], MD regarding development and update of comprehensive plan of care as evidenced by provider attestation and co-signature ?Inter-disciplinary care team collaboration (see longitudinal plan of care) ?Provided patient with basic written and verbal COPD education on self care/management/and exacerbation prevention  ?Provided patient with COPD action plan and reinforced importance of daily self assessment ?Provided instruction about proper use of medications used for management of COPD including inhalers ?Advised patient to self assesses COPD action plan zone and make appointment with provider if in the yellow zone for 48 hours without improvement. ?Provided education about and advised patient to utilize infection prevention strategies to reduce risk of respiratory infection  ?  Healthy lifestyle promoted and encouraged to develop a rescue plan ?Rescue (action) plan reviewed and signs/symptoms of worsening disease assessed; encouraged to follow rescue plan if symptoms flare-up ?Discussed eliminating symptom triggers at home ?Encouraged to attend all scheduled provider appointments   ?Instructed to call provider office for new concerns or questions ?Reviewed and assessed barriers and manage adherence, including inhaler technique and persistent trigger exposure; encourage adherence, even when symptoms are controlled or infrequent, Encouraged to monitor for signs/symptoms of psychosocial concerns, such as shortness of breath-anxiety cycle or depression that may impact stability  of symptoms ?FALL PRECAUTIONS AND PREVENTIONS REVIEWED AND DISCUSSED ?ENCOURAGED HAVING FAMILY OR FRIEND ASSIST WITH CARE AND TO STAY WITH PATIENT UNTIL STRENGTH RETURNS ?REVIEWED AND ENCOURAGED CONTINUED

## 2021-12-08 ENCOUNTER — Encounter: Payer: Self-pay | Admitting: Internal Medicine

## 2021-12-08 ENCOUNTER — Ambulatory Visit (INDEPENDENT_AMBULATORY_CARE_PROVIDER_SITE_OTHER): Payer: Medicare Other | Admitting: Internal Medicine

## 2021-12-08 ENCOUNTER — Other Ambulatory Visit: Payer: Self-pay

## 2021-12-08 VITALS — BP 132/64 | HR 42 | Temp 97.9°F | Ht 60.0 in | Wt 117.0 lb

## 2021-12-08 DIAGNOSIS — N390 Urinary tract infection, site not specified: Secondary | ICD-10-CM

## 2021-12-08 DIAGNOSIS — N1832 Chronic kidney disease, stage 3b: Secondary | ICD-10-CM | POA: Diagnosis not present

## 2021-12-08 DIAGNOSIS — K146 Glossodynia: Secondary | ICD-10-CM | POA: Diagnosis not present

## 2021-12-08 DIAGNOSIS — F4321 Adjustment disorder with depressed mood: Secondary | ICD-10-CM

## 2021-12-08 DIAGNOSIS — I498 Other specified cardiac arrhythmias: Secondary | ICD-10-CM

## 2021-12-08 DIAGNOSIS — M6281 Muscle weakness (generalized): Secondary | ICD-10-CM

## 2021-12-08 DIAGNOSIS — R0602 Shortness of breath: Secondary | ICD-10-CM

## 2021-12-08 DIAGNOSIS — E538 Deficiency of other specified B group vitamins: Secondary | ICD-10-CM | POA: Diagnosis not present

## 2021-12-08 DIAGNOSIS — I6523 Occlusion and stenosis of bilateral carotid arteries: Secondary | ICD-10-CM | POA: Diagnosis not present

## 2021-12-08 DIAGNOSIS — I499 Cardiac arrhythmia, unspecified: Secondary | ICD-10-CM | POA: Insufficient documentation

## 2021-12-08 MED ORDER — LIDOCAINE VISCOUS HCL 2 % MT SOLN: 5.0000 mL | Freq: Four times a day (QID) | OROMUCOSAL | 1 refills | Status: DC | PRN

## 2021-12-08 NOTE — Progress Notes (Signed)
? ?Subjective:  ?Patient ID: [name], female    DOB: 01/14/1926  Age: 88 y.o. MRN: [id number] ? ?CC: The primary encounter diagnosis was Recurrent UTI. Diagnoses of Other cardiac arrhythmia, Burning mouth syndrome, Stage 3b chronic kidney disease (HCC), SOB (shortness of breath), Generalized muscle weakness, Grief, and B12 deficiency were also pertinent to this visit. ? ? ?This visit occurred during the SARS-CoV-2 public health emergency.  Safety protocols were in place, including screening questions prior to the visit, additional usage of staff PPE, and extensive cleaning of exam room while observing appropriate contact time as indicated for disinfecting solutions.   ? ?HPI ?[name] presents for follow up on ER visits.  Accompanied by her daughter.  ?Chief Complaint  ?Patient presents with  ? Follow-up  ?  Ed follow up on weakness  ? ?She has multiple complaints ? ?1) mouth pain,  altered taste.  She has been diagnosed with "burning mouth syndrome " after seeing multiple specialists.  No relief yet.   ? ? ?2) treated for UTI during ER visit on March 3 for generalized weakness without UTI symptoms.  Finished abx and had a Repeat eval on march 13 for same.  UA still abnormal but culture was notable for 10K colonies,  suggesting UTI resolved.   ? ?3) Generalized weakness:  Having good days  of increased energy resulting in normalization of activities,  followed by days of fatigue and weakness.   ? ?4) grief:  sister in law [name] passed away peacefully in her chair,  was found by patient  when she could not reach her by phone . ? ? ? ? ?Outpatient Medications Prior to Visit  ?Medication Sig Dispense Refill  ? ALPRAZolam (XANAX) 0.25 MG tablet Take 1 tablet (0.25 mg total) by mouth at bedtime as needed. 30 tablet 5  ? Bacillus Coagulans-Inulin (PROBIOTIC) 1-250 BILLION-MG CAPS Take 1 capsule by mouth daily. 30 capsule 0  ? Cranberry 125 MG TABS Take 1 tablet by mouth daily.    ? esomeprazole  (NEXIUM) 40 MG capsule Take 40 mg by mouth daily at 12 noon.    ? furosemide (LASIX) 20 MG tablet TAKE 1 TABLET BY MOUTH 1-2 DAYS PER WEEKAS NEEDED 10 tablet 0  ? Ipratropium-Albuterol (COMBIVENT RESPIMAT) 20-100 MCG/ACT AERS respimat INHALE ONE PUFF EVERY SIX HOURS 4 g 5  ? isosorbide mononitrate (IMDUR) 60 MG 24 hr tablet TAKE 1 AND 1/2 TABLETS BY MOUTH TWO TIMES DAILY 270 tablet 3  ? nitroGLYCERIN (NITROSTAT) 0.4 MG SL tablet PLACE 1 TABLET UNDER TONGUE EVERY 5 MIN AS NEEDED FOR CHEST PAIN IF NO RELIEF IN15 MIN CALL 911 (MAX 3 TABS) 25 tablet 3  ? Probiotic Product (PROBIOTIC PO) Take 1 capsule by mouth daily.    ? propranolol (INDERAL) 20 MG tablet TAKE ONE TABLET 3 TIMES DAILY AS NEEDED FOR TACHYCARDIA 270 tablet 3  ? vitamin B-12 (CYANOCOBALAMIN) 500 MCG tablet Take 500 mcg by mouth daily.    ? atorvastatin (LIPITOR) 40 MG tablet TAKE 1 TABLET BY MOUTH DAILY 90 tablet 1  ? cephALEXin (KEFLEX) 500 MG capsule Take 1 capsule (500 mg total) by mouth 2 (two) times daily. 14 capsule 0  ? clopidogrel (PLAVIX) 75 MG tablet TAKE 1 TABLET BY MOUTH DAILY 90 tablet 0  ? losartan (COZAAR) 100 MG tablet TAKE 1 TABLET BY MOUTH DAILY 90 tablet 0  ? ranolazine (RANEXA) 500 MG 12 hr tablet TAKE 1 TABLET BY MOUTH TWICE DAILY 180 tablet 0  ?  nystatin (MYCOSTATIN) 100000 UNIT/ML suspension Take 5 mLs (500,000 Units total) by mouth 4 (four) times daily. (Patient not taking: Reported on 12/08/2021) 60 mL 0  ? ?No facility-administered medications prior to visit.  ? ? ?Review of Systems; ? ?Patient denies headache, fevers, malaise, unintentional weight loss, skin rash, eye pain, sinus congestion and sinus pain, sore throat, dysphagia,  hemoptysis , cough, dyspnea, wheezing, chest pain, palpitations, orthopnea, edema, abdominal pain, nausea, melena, diarrhea, constipation, flank pain, dysuria, hematuria, urinary  Frequency, nocturia, numbness, tingling, seizures,  Focal weakness, Loss of consciousness,  Tremor, insomnia, depression,  anxiety, and suicidal ideation.   ? ? ? ?Objective:  ?BP 132/64 (BP Location: Left Arm, Patient Position: Sitting, Cuff Size: Normal)   Pulse (!) 42   Temp 97.9 ?F (36.6 ?C) (Oral)   Ht 5' (1.524 m)   Wt 117 lb (53.1 kg)   SpO2 97%   BMI 22.85 kg/m?  ? ?BP Readings from Last 3 Encounters:  ?12/08/21 132/64  ?11/29/21 (!) 144/71  ?11/22/21 140/72  ? ? ?Wt Readings from Last 3 Encounters:  ?12/08/21 117 lb (53.1 kg)  ?11/29/21 129 lb (58.5 kg)  ?11/22/21 129 lb (58.5 kg)  ? ? ?General appearance: alert, cooperative and appears stated age ?Ears: normal TM's and external ear canals both ears ?Throat: lips, mucosa, and tongue normal; teeth and gums normal ?Neck: no adenopathy, no carotid bruit, supple, symmetrical, trachea midline and thyroid not enlarged, symmetric, no tenderness/mass/nodules ?Back: symmetric, no curvature. ROM normal. No CVA tenderness. ?Lungs: clear to auscultation bilaterally ?Heart: regular rate and rhythm, S1, S2 normal, no murmur, click, rub or gallop ?Abdomen: soft, non-tender; bowel sounds normal; no masses,  no organomegaly ?Pulses: 2+ and symmetric ?Skin: Skin color, texture, turgor normal. No rashes or lesions ?Lymph nodes: Cervical, supraclavicular, and axillary nodes normal. ?Psych: affect sad,  makes good eye contact. No fidgeting,  not crying.  Denies suicidal thoughts   ? ?Lab Results  ?Component Value Date  ? HGBA1C 5.8 01/30/2017  ? HGBA1C 5.8 09/26/2016  ? ? ?Lab Results  ?Component Value Date  ? CREATININE 1.43 (H) 11/29/2021  ? CREATININE 1.39 (H) 11/18/2021  ? CREATININE 1.42 (H) 07/07/2021  ? ? ?Lab Results  ?Component Value Date  ? WBC 6.8 11/29/2021  ? HGB 12.9 11/29/2021  ? HCT 40.6 11/29/2021  ? PLT 231 11/29/2021  ? GLUCOSE 129 (H) 11/29/2021  ? CHOL 128 04/02/2021  ? TRIG 108 04/02/2021  ? HDL 48 04/02/2021  ? LDLDIRECT 72.0 11/09/2015  ? [place] 58 04/02/2021  ? ALT 13 11/18/2021  ? AST 22 11/18/2021  ? NA 136 11/29/2021  ? K 4.2 11/29/2021  ? CL 101 11/29/2021  ?  CREATININE 1.43 (H) 11/29/2021  ? BUN 19 11/29/2021  ? CO2 28 11/29/2021  ? TSH 3.129 11/29/2021  ? INR 0.9 01/31/2018  ? HGBA1C 5.8 01/30/2017  ? MICROALBUR 2.5 (H) 12/26/2011  ? ? ?No results found. ? ?Assessment & Plan:  ? ?Problem List Items Addressed This Visit   ? ? Stage 3b chronic kidney disease ([place])  ?  Recent ER visit labs with no change in GFR  ;  in the past    Nephrology referral declined.  She is avoiding NSAIDs ?  ?  ? SOB (shortness of breath)  ?   multifactorial with CAD and COPD contributing. She is becoming more sedentary to remain  Asymptomatic.  DNR status discussed at last visit and DNR orders placed. ?  ?  ? Grief  ?  Secondary to recurrent loss of friends and family.  Continue lexapro.  Grief counselling offered but declined ?  ?  ? Generalized muscle weakness  ?  This was her presentation ,  When found to have a UTI.  Rechecking urine today given report of increased fatigue and weakness  in the last 24 hours.  UA without pyuria but with increased SG of 1.030, consistent with clinical history of dehydration.   encouraged to drink more water   ? ?  ?  ? Burning mouth syndrome  ?  Has tried multiple suspensions without change.  Trial of magic  mouthwash with lidocaine /nystatin  ?  ?  ? B12 deficiency  ?  She has persistent macrocytosis without anemia .  B12 level is fine . Given her age, NO FURTHER WORKUP  ? ?Lab Results  ?Component Value Date  ? [id number] >1504 (H) 11/22/2021  ? ? ?  ?  ? Arrhythmia  ?  March 14 EKG in ER noted SR, frequent PVC's.  prolonged QT.  Repeat done today notes  ?  ?  ? Relevant Orders  ? DNR (Do Not Resuscitate)  ? ?Other Visit Diagnoses   ? ? Recurrent UTI    -  Primary  ? Relevant Orders  ? Urinalysis, Routine w reflex microscopic (Completed)  ? Urine Culture  ? ?  ? ? ?I spent 30 minutes dedicated to the care of this patient on the date of this encounter to include pre-visit review of patient's medical history,  most recent imaging studies, Face-to-face time  with the patient , and post visit ordering of testing and therapeutics.   ? ?Follow-up: No follow-ups on file. ? ? ?[name], MD ?

## 2021-12-08 NOTE — Patient Instructions (Signed)
[facility] has lidocaine in it.  You can use it 4 times daily if it helps ? ? ?

## 2021-12-08 NOTE — Assessment & Plan Note (Signed)
March 14 EKG in ER noted SR, frequent PVC's.  prolonged QT.  Repeat done today notes  ?

## 2021-12-08 NOTE — Assessment & Plan Note (Addendum)
Has tried multiple suspensions without change.  Trial of magic  mouthwash with lidocaine /nystatin  ?

## 2021-12-09 ENCOUNTER — Other Ambulatory Visit: Payer: Self-pay | Admitting: Internal Medicine

## 2021-12-09 ENCOUNTER — Other Ambulatory Visit: Payer: Self-pay | Admitting: Cardiovascular Disease

## 2021-12-09 DIAGNOSIS — M6281 Muscle weakness (generalized): Secondary | ICD-10-CM | POA: Insufficient documentation

## 2021-12-09 LAB — URINE CULTURE
MICRO NUMBER:: 13164908
Result:: NO GROWTH
SPECIMEN QUALITY:: ADEQUATE

## 2021-12-09 LAB — URINALYSIS, ROUTINE W REFLEX MICROSCOPIC
Hgb urine dipstick: NEGATIVE
Nitrite: NEGATIVE
Specific Gravity, Urine: >=1.03 — AB (ref 1.000–1.030)
Total Protein, Urine: 100 — AB
Urine Glucose: NEGATIVE
Urobilinogen, UA: 1 (ref 0.0–1.0)
pH: 5.5 (ref 5.0–8.0)

## 2021-12-09 NOTE — Assessment & Plan Note (Addendum)
multifactorial with CAD and COPD contributing. She is becoming more sedentary to remain  Asymptomatic.  DNR status discussed at last visit and DNR orders placed. ?

## 2021-12-09 NOTE — Assessment & Plan Note (Addendum)
This was her presentation ,  When found to have a UTI.  Rechecking urine today given report of increased fatigue and weakness  in the last 24 hours.  UA without pyuria but with increased SG of 1.030, consistent with clinical history of dehydration.   encouraged to drink more water   ? ?

## 2021-12-09 NOTE — Assessment & Plan Note (Addendum)
She has persistent macrocytosis without anemia .  B12 level is fine . Given her age, NO FURTHER WORKUP  ? ?Lab Results  ?Component Value Date  ? [id number] >1504 (H) 11/22/2021  ? ? ?

## 2021-12-09 NOTE — Assessment & Plan Note (Signed)
Secondary to recurrent loss of friends and family.  Continue lexapro.  Grief counselling offered but declined ?

## 2021-12-09 NOTE — Assessment & Plan Note (Signed)
Recent ER visit labs with no change in GFR  ;  in the past    Nephrology referral declined.  She is avoiding NSAIDs ?

## 2021-12-13 ENCOUNTER — Telehealth: Payer: Self-pay

## 2021-12-13 NOTE — Telephone Encounter (Signed)
-----   Message from Doreen Beam, [place] sent at 12/09/2021  8:38 PM EDT ----- ?Negative urine culture ?

## 2021-12-15 ENCOUNTER — Ambulatory Visit: Payer: Medicare Other | Admitting: *Deleted

## 2021-12-15 ENCOUNTER — Telehealth: Payer: Self-pay | Admitting: Internal Medicine

## 2021-12-15 ENCOUNTER — Other Ambulatory Visit: Payer: Self-pay

## 2021-12-15 DIAGNOSIS — K146 Glossodynia: Secondary | ICD-10-CM

## 2021-12-15 DIAGNOSIS — Z87440 Personal history of urinary (tract) infections: Secondary | ICD-10-CM

## 2021-12-15 DIAGNOSIS — N390 Urinary tract infection, site not specified: Secondary | ICD-10-CM

## 2021-12-15 DIAGNOSIS — I10 Essential (primary) hypertension: Secondary | ICD-10-CM

## 2021-12-15 DIAGNOSIS — M6281 Muscle weakness (generalized): Secondary | ICD-10-CM

## 2021-12-15 MED ORDER — ONDANSETRON HCL 4 MG PO TABS: 4.0000 mg | ORAL_TABLET | Freq: Three times a day (TID) | ORAL | 0 refills | Status: DC | PRN

## 2021-12-15 NOTE — Telephone Encounter (Signed)
Zofran sent to pharmacy

## 2021-12-15 NOTE — Chronic Care Management (AMB) (Signed)
?  Care Management  ? ?Follow Up Note ? ? ?12/15/2021 ?Name: [name] MRN: [id number] DOB: 19-Jan-1926 ? ? ?Referred by: [name], MD ?Reason for referral : Chronic Care Management (UTI, WEAKNESS) ? ? ?Successful outreach for follow up, daughter answered.   Stated patient not feeling well and requesting call back another day and time. ? ?Follow Up Plan: The care management team will reach out to the patient again over the next 20 days.  ? ?Hubert Azure RN, MSN ?RN Care Management Coordinator ?[place] ?[phone] ?[email or web address] ? ? ?

## 2021-12-15 NOTE — Telephone Encounter (Signed)
Pt daughter called in stating that pt is feeling nauseated... Pt daughter stated that pt hasn't really eaten anything to feel nauseated... Pt daughter would like to know what can she give pt to help her... Pt daughter requesting callback  ?

## 2021-12-17 ENCOUNTER — Ambulatory Visit: Payer: Medicare Other | Admitting: Internal Medicine

## 2021-12-17 ENCOUNTER — Encounter: Payer: Self-pay | Admitting: Internal Medicine

## 2021-12-17 DIAGNOSIS — J449 Chronic obstructive pulmonary disease, unspecified: Secondary | ICD-10-CM

## 2021-12-17 DIAGNOSIS — I10 Essential (primary) hypertension: Secondary | ICD-10-CM

## 2021-12-17 NOTE — Telephone Encounter (Addendum)
Pt daughter called about pt. Pt daughter left a Pharmacist, community message sent to acces nurse ?Pt daughter number [phone] ?

## 2021-12-21 ENCOUNTER — Ambulatory Visit
Admission: RE | Admit: 2021-12-21 | Discharge: 2021-12-21 | Disposition: A | Discharge status: Home / Self Care | Payer: Medicare Other | Source: Ambulatory Visit | Attending: Internal Medicine | Admitting: Internal Medicine

## 2021-12-21 ENCOUNTER — Ambulatory Visit (INDEPENDENT_AMBULATORY_CARE_PROVIDER_SITE_OTHER): Payer: Medicare Other | Admitting: Internal Medicine

## 2021-12-21 ENCOUNTER — Other Ambulatory Visit: Payer: Self-pay

## 2021-12-21 ENCOUNTER — Encounter: Payer: Self-pay | Admitting: Internal Medicine

## 2021-12-21 VITALS — BP 160/52 | HR 40 | Temp 98.0°F | Ht 60.0 in | Wt 119.4 lb

## 2021-12-21 DIAGNOSIS — R634 Abnormal weight loss: Secondary | ICD-10-CM

## 2021-12-21 DIAGNOSIS — R5383 Other fatigue: Secondary | ICD-10-CM

## 2021-12-21 DIAGNOSIS — R001 Bradycardia, unspecified: Secondary | ICD-10-CM | POA: Diagnosis not present

## 2021-12-21 DIAGNOSIS — R110 Nausea: Secondary | ICD-10-CM

## 2021-12-21 DIAGNOSIS — N1832 Chronic kidney disease, stage 3b: Secondary | ICD-10-CM

## 2021-12-21 DIAGNOSIS — N3000 Acute cystitis without hematuria: Secondary | ICD-10-CM | POA: Diagnosis not present

## 2021-12-21 DIAGNOSIS — R531 Weakness: Secondary | ICD-10-CM | POA: Diagnosis not present

## 2021-12-21 DIAGNOSIS — R42 Dizziness and giddiness: Secondary | ICD-10-CM

## 2021-12-21 DIAGNOSIS — K5900 Constipation, unspecified: Secondary | ICD-10-CM | POA: Diagnosis not present

## 2021-12-21 MED ORDER — ONDANSETRON HCL 4 MG PO TABS: 4.0000 mg | ORAL_TABLET | Freq: Three times a day (TID) | ORAL | 0 refills | Status: DC | PRN

## 2021-12-21 NOTE — Progress Notes (Signed)
Chief Complaint  ?Patient presents with  ? Fatigue  ? ?F/u with daughter  ?1/ c/o fatigue, lightheadedness and weakness was doing PT [facility] outpatient but now unable to leave home due to dizziness and went to the ED 11/2021 for similar sxs txed for UTI but repeat urine negative labs indicate cKD 3B for now they decline referral kidney specialist. Also c/o nausea since 11/2021 and prn zofran if helping. With dizziness the room is not spinning and weakness getting worse x 1 month. Appetite down and wt down 12 lbs but up 2 lbs so now down 10 lbs which is new  ? ?2. Bradycardia new today  ?3. Grief sister in law died 47 month ago ? ?Review of Systems  ?Constitutional:  Negative for weight loss.  ?HENT:  Negative for hearing loss.   ?Eyes:  Negative for blurred vision.  ?Respiratory:  Negative for shortness of breath.   ?Cardiovascular:  Negative for chest pain.  ?Gastrointestinal:  Positive for nausea. Negative for abdominal pain and blood in stool.  ?Genitourinary:  Negative for dysuria.  ?Musculoskeletal:  Negative for falls and joint pain.  ?Skin:  Negative for rash.  ?Neurological:  Positive for dizziness and weakness. Negative for headaches.  ?Psychiatric/Behavioral:  Negative for depression.   ?Past Medical History:  ?Diagnosis Date  ? C. difficile colitis   ? CAD (coronary artery disease)   ? a. s/p CABG 1999; b. Nuclear 10/11, no scar or ischemia, EF 68%; b. [place] 06/18/14: no evidence of infarction or ischemia, EF 77%, low risk study; c. 02/2019 MV: EF 56%, ? mild lat ischemia->low risk.  ? Carotid artery disease ([place])   ? a. Doppler January, 2012, 40-59% bilateral; b. 02/2017 U/S: 1-39% bilat ICA stenosis.  ? Closed fracture pubis ([place]) 07/04/2017  ? COPD (chronic obstructive pulmonary disease) ([place])   ? Diffuse cystic mastopathy   ? Diverticulitis   ? last colonoscopy  incomplete Jan 2011  ? Dizziness   ? stable now (Oct 2011) patient tells me question of TIA, we will obtain records  ? Gait abnormality   ?  Patient complains of "wobbly gait", December, 2013  ? GERD (gastroesophageal reflux disease)   ? History of migraines   ? Hx of CABG   ? a. 3 vessel in 1999  ? Hyperlipidemia   ? Hypertension   ? Indigestion   ? 3 weeks, October 2011  ? Kidney stones   ? Rheumatic fever   ? SCC (squamous cell carcinoma), face 08/01/2016  ? Sinus bradycardia   ? Mild, December, 2013  ? SOB (shortness of breath)   ? Syncopal episodes   ? after 18 holes of golf and increase hydrochlorothiazide, resolved   ? ?Past Surgical History:  ?Procedure Laterality Date  ? APPENDECTOMY  1950  ? BREAST BIOPSY Right 1994  ? BREAST CYST ASPIRATION    ? multiple BIL  ? CATARACT EXTRACTION Right 2009  ? CATARACT EXTRACTION Left 2011  ? COLONOSCOPY  [id number]  ? Dr. Jamal Collin  ? CORONARY ARTERY BYPASS GRAFT  1999  ? esophagus stretched    ? TONSILLECTOMY  1939  ? TOTAL ABDOMINAL HYSTERECTOMY  1968  ? due to metrorrhagia  ? ?Family History  ?Problem Relation Age of Onset  ? Heart disease Mother   ? Heart disease Father   ? Cancer Neg Hx   ? Drug abuse Neg Hx   ? Breast cancer Neg Hx   ? ?Social History  ? ?Socioeconomic History  ? Marital status:  Widowed  ?  Spouse name: Not on file  ? Number of children: 2  ? Years of education: Not on file  ? Highest education level: Not on file  ?Occupational History  ?  Employer: RETIRED  ?Tobacco Use  ? Smoking status: Former  ?  Packs/day: 0.50  ?  Years: 40.00  ?  Pack years: 20.00  ?  Types: Cigarettes  ?  Quit date: 09/20/1983  ?  Years since quitting: 38.2  ? Smokeless tobacco: Never  ?Vaping Use  ? Vaping Use: Never used  ?Substance and Sexual Activity  ? Alcohol use: Not Currently  ?  Comment: yes, social wine  ? Drug use: No  ? Sexual activity: Never  ?Other Topics Concern  ? Not on file  ?Social History Narrative  ? She is widowed,  Lives independently , ambulates independently at baseline  ? ?Social Determinants of Health  ? ?Financial Resource Strain: Medium Risk  ? Difficulty of Paying Living Expenses:  Somewhat hard  ?Food Insecurity: No Food Insecurity  ? Worried About Charity fundraiser in the Last Year: Never true  ? Ran Out of Food in the Last Year: Never true  ?Transportation Needs: No Transportation Needs  ? Lack of Transportation (Medical): No  ? Lack of Transportation (Non-Medical): No  ?Physical Activity: Not on file  ?Stress: Not on file  ?Social Connections: Not on file  ?Intimate Partner Violence: Not At Risk  ? Fear of Current or Ex-Partner: No  ? Emotionally Abused: No  ? Physically Abused: No  ? Sexually Abused: No  ? ?No outpatient medications have been marked as taking for the 12/21/21 encounter (Office Visit) with McLean-Scocuzza, Nino Glow, MD.  ? ?No Known Allergies ?Recent Results (from the past 2160 hour(s))  ?Basic metabolic panel     Status: Abnormal  ? Collection Time: 11/18/21  5:40 PM  ?Result Value Ref Range  ? Sodium 135 135 - 145 mmol/L  ? Potassium 4.3 3.5 - 5.1 mmol/L  ? Chloride 97 (L) 98 - 111 mmol/L  ? CO2 28 22 - 32 mmol/L  ? Glucose, Bld 108 (H) 70 - 99 mg/dL  ?  Comment: Glucose reference range applies only to samples taken after fasting for at least 8 hours.  ? BUN 21 8 - 23 mg/dL  ? Creatinine, Ser 1.39 (H) 0.44 - 1.00 mg/dL  ? Calcium 8.7 (L) 8.9 - 10.3 mg/dL  ? GFR, Estimated 35 (L) >60 mL/min  ?  Comment: (NOTE) ?Calculated using the CKD-EPI Creatinine Equation (2021) ?  ? Anion gap 10 5 - 15  ?  Comment: Performed at [facility], [address], [place], [place] [address]  ?CBC     Status: Abnormal  ? Collection Time: 11/18/21  5:40 PM  ?Result Value Ref Range  ? WBC 6.2 4.0 - 10.5 K/uL  ? RBC 3.74 (L) 3.87 - 5.11 MIL/uL  ? Hemoglobin 12.7 12.0 - 15.0 g/dL  ? HCT 39.0 36.0 - 46.0 %  ? MCV 104.3 (H) 80.0 - 100.0 fL  ? MCH 34.0 26.0 - 34.0 pg  ? MCHC 32.6 30.0 - 36.0 g/dL  ? RDW 11.9 11.5 - 15.5 %  ? Platelets 183 150 - 400 K/uL  ? nRBC 0.0 0.0 - 0.2 %  ?  Comment: Performed at [facility], [address] [place], [place] [address]  ?Troponin I (High  Sensitivity)     Status: None  ? Collection Time: 11/18/21  5:40 PM  ?  Result Value Ref Range  ? Troponin I (High Sensitivity) 12 <18 ng/L  ?  Comment: (NOTE) ?Elevated high sensitivity troponin I (hsTnI) values and significant  ?changes across serial measurements may suggest ACS but many other  ?chronic and acute conditions are known to elevate hsTnI results.  ?Refer to the "Links" section for chest pain algorithms and additional  ?guidance. ?Performed at [facility], [place], ?[place] [address] ?  ?Lipase, blood     Status: None  ? Collection Time: 11/18/21  5:40 PM  ?Result Value Ref Range  ? Lipase 48 11 - 51 U/L  ?  Comment: Performed at [facility], [address] [place], [place] [address]  ?Hepatic function panel     Status: Abnormal  ? Collection Time: 11/18/21  5:40 PM  ?Result Value Ref Range  ? Total Protein 6.2 (L) 6.5 - 8.1 g/dL  ? Albumin 3.8 3.5 - 5.0 g/dL  ? AST 22 15 - 41 U/L  ? ALT 13 0 - 44 U/L  ? Alkaline Phosphatase 61 38 - 126 U/L  ? Total Bilirubin 0.7 0.3 - 1.2 mg/dL  ? Bilirubin, Direct 0.2 0.0 - 0.2 mg/dL  ? Indirect Bilirubin 0.5 0.3 - 0.9 mg/dL  ?  Comment: Performed at [facility], [address], [place], [place] [address]  ?Urinalysis, Complete w Microscopic     Status: Abnormal  ? Collection Time: 11/18/21  8:58 PM  ?Result Value Ref Range  ? Color, Urine YELLOW (A) YELLOW  ? APPearance HAZY (A) CLEAR  ? Specific Gravity, Urine 1.010 1.005 - 1.030  ? pH 5.0 5.0 - 8.0  ? Glucose, UA NEGATIVE NEGATIVE mg/dL  ? Hgb urine dipstick NEGATIVE NEGATIVE  ? Bilirubin Urine NEGATIVE NEGATIVE  ? Ketones, ur NEGATIVE NEGATIVE mg/dL  ? Protein, ur 30 (A) NEGATIVE mg/dL  ? Nitrite NEGATIVE NEGATIVE  ? Leukocytes,Ua LARGE (A) NEGATIVE  ? RBC / HPF 0-5 0 - 5 RBC/hpf  ? WBC, UA >50 (H) 0 - 5 WBC/hpf  ? Bacteria, UA NONE SEEN NONE SEEN  ? Squamous Epithelial / LPF 0-5 0 - 5  ? Mucus PRESENT   ?  Comment: Performed at [facility], [address], [place],  [address]  ?Urine Culture     Status: Abnormal  ? Collection Time: 11/18/21  8:58 PM  ? Specimen: Urine, Random  ?Result Value Ref Range  ? Specimen Description    ?  URINE, RANDOM ?[place]fo

## 2021-12-21 NOTE — Patient Instructions (Addendum)
Chronic Kidney Disease, Adult Chronic kidney disease (CKD) occurs when the kidneys are slowly and permanently damaged over a long period of time. The kidneys are a pair of organs that do many important jobs in the body, including: Removing waste and extra fluid from the blood to make urine. Making hormones that maintain the amount of fluid in tissues and blood vessels. Maintaining the right amount of fluids and chemicals in the body. A small amount of kidney damage may not cause problems, but a large amount of damage may make it hard or impossible for the kidneys to work right. Steps must be taken to slow kidney damage or to stop it from getting worse. If steps are not taken, the kidneys may stop working permanently (end-stage renal disease, or ESRD). Most of the time, CKD does not go away, but it can often be controlled. People who have CKD are usually able to live full lives. What are the causes? The most common causes of this condition are diabetes and high blood pressure (hypertension). Other causes include: Cardiovascular diseases. These affect the heart and blood vessels. Kidney diseases. These include: Glomerulonephritis, or inflammation of the tiny filters in the kidneys. Interstitial nephritis. This is swelling of the small tubes of the kidneys and of the surrounding structures. Polycystic kidney disease, in which clusters of fluid-filled sacs form within the kidneys. Renal vascular disease. This includes disorders that affect the arteries and veins of the kidneys. Diseases that affect the body's defense system (immune system). A problem with urine flow. This may be caused by: Kidney stones. Cancer. An enlarged prostate, in males. A kidney infection or urinary tract infection (UTI) that keeps coming back. Vasculitis. This is swelling or inflammation of the blood vessels. What increases the risk? Your chances of having kidney disease increase with age. The following factors may make  you more likely to develop this condition: A family history of kidney disease or kidney failure. Kidney failure means the kidneys can no longer work right. Certain genetic diseases. Taking medicines often that are damaging to the kidneys. Being around or being in contact with toxic substances. Obesity. A history of tobacco use. What are the signs or symptoms? Symptoms of this condition include: Feeling very tired (lethargic) and having less energy. Swelling, or edema, of the face, legs, ankles, or feet. Nausea or vomiting, or loss of appetite. Confusion or trouble concentrating. Muscle twitches and cramps, especially in the legs. Dry, itchy skin. A metallic taste in the mouth. Producing less urine, or producing more urine (especially at night). Shortness of breath. Trouble sleeping. CKD may also result in not having enough red blood cells or hemoglobin in the blood (anemia) or having weak bones (bone disease). Symptoms develop slowly and may not be obvious until the kidney damage becomes severe. It is possible to have kidney disease for years without having symptoms. How is this diagnosed? This condition may be diagnosed based on: Blood tests. Urine tests. Imaging tests, such as an ultrasound or a CT scan. A kidney biopsy. This involves removing a sample of kidney tissue to be looked at under a microscope. Results from these tests will help to determine how serious the CKD is. How is this treated? There is no cure for most cases of this condition, but treatment usually relieves symptoms and prevents or slows the worsening of the disease. Treatment may include: Diet changes, which may require you to avoid alcohol and foods that are high in salt, potassium, phosphorous, and protein. Medicines. These may:   Lower blood pressure. ?Control blood sugar (glucose). ?Relieve anemia. ?Relieve swelling. ?Protect your bones. ?Improve the balance of salts and minerals in your blood  (electrolytes). ?Dialysis, which is a type of treatment that removes toxic waste from the body. It may be needed if you have kidney failure. ?Managing any other conditions that are causing your CKD or making it worse. ?Follow these instructions at home: ?Medicines ?Take over-the-counter and prescription medicines only as told by your health care provider. The amount of some medicines that you take may need to be changed. ?Do not take any new medicines unless approved by your health care provider. Many medicines can make kidney damage worse. ?Do not take any vitamin and mineral supplements unless approved by your health care provider. Many nutritional supplements can make kidney damage worse. ?Lifestyle ? ?Do not use any products that contain nicotine or tobacco, such as cigarettes, e-cigarettes, and chewing tobacco. If you need help quitting, ask your health care provider. ?If you drink alcohol: ?Limit how much you use to: ?0-1 drink a day for women who are not pregnant. ?0-2 drinks a day for men. ?Know how much alcohol is in your drink. In the [place] one drink equals one 12 oz bottle of beer (355 mL), one 5 oz glass of wine (148 mL), or one 1? oz glass of hard liquor (44 mL). ?Maintain a healthy weight. If you need help, ask your health care provider. ?General instructions ? ?Follow instructions from your health care provider about eating or drinking restrictions, including any prescribed diet. ?Track your blood pressure at home. Report changes in your blood pressure as told. ?If you are being treated for diabetes, track your blood glucose levels as told. ?Start or continue an exercise plan. Exercise at least 30 minutes a day, 5 days a week. ?Keep your immunizations up to date as told. ?Keep all follow-up visits. This is important. ?Where to find more information ?American Association of Kidney Patients: BombTimer.gl ?[place]: www.kidney.org ?[place]: https://mathis.com/ ?Life Options:  www.lifeoptions.org ?[employer]idney [employer]: www.kidneyschool.org ?Contact a health care provider if: ?Your symptoms get worse. ?You develop new symptoms. ?Get help right away if: ?You develop symptoms of ESRD. These include: ?Headaches. ?Numbness in your hands or feet. ?Easy bruising. ?Frequent hiccups. ?Chest pain. ?Shortness of breath. ?Lack of menstrual periods, in women. ?You have a fever. ?You are producing less urine than usual. ?You have pain or bleeding when you urinate or when you have a bowel movement. ?These symptoms may represent a serious problem that is an emergency. Do not wait to see if the symptoms will go away. Get medical help right away. Call your local emergency services (911 in the [place] Do not drive yourself to the hospital. ?Summary ?Chronic kidney disease (CKD) occurs when the kidneys become damaged slowly over a long period of time. ?The most common causes of this condition are diabetes and high blood pressure (hypertension). ?There is no cure for most cases of CKD, but treatment usually relieves symptoms and prevents or slows the worsening of the disease. Treatment may include a combination of lifestyle changes, medicines, and dialysis. ?This information is not intended to replace advice given to you by your health care provider. Make sure you discuss any questions you have with your health care provider. ?Document Revised: 12/11/2019 Document Reviewed: 12/11/2019 ?Elsevier Patient Education ? [place]. ? ?Orthostatic Hypotension ?Blood pressure is a measurement of how strongly, or weakly, your circulating blood is pressing against the walls of your arteries. Orthostatic  hypotension is a drop in blood pressure that can happen when you change positions, such as when you go from lying down to standing. ?Arteries are blood vessels that carry blood from your heart throughout your body. When blood pressure is too low, you may not get enough blood to your brain or to the rest of your organs.  Orthostatic hypotension can cause light-headedness, sweating, rapid heartbeat, blurred vision, and fainting. These symptoms require further investigation into the cause. ?What are the causes? ?Orthostatic hypotension can b

## 2021-12-22 LAB — URINALYSIS, ROUTINE W REFLEX MICROSCOPIC
Bacteria, UA: NONE SEEN /HPF
Bilirubin Urine: NEGATIVE
Glucose, UA: NEGATIVE
Hgb urine dipstick: NEGATIVE
Hyaline Cast: NONE SEEN /LPF
Ketones, ur: NEGATIVE
Nitrite: NEGATIVE
Specific Gravity, Urine: 1.019 (ref 1.001–1.035)
pH: <=5 (ref 5.0–8.0)

## 2021-12-22 LAB — MICROSCOPIC MESSAGE

## 2021-12-22 LAB — URINE CULTURE
MICRO NUMBER:: 13220307
SPECIMEN QUALITY:: ADEQUATE

## 2021-12-23 ENCOUNTER — Encounter: Payer: Self-pay | Admitting: Internal Medicine

## 2021-12-27 ENCOUNTER — Ambulatory Visit (INDEPENDENT_AMBULATORY_CARE_PROVIDER_SITE_OTHER): Payer: Medicare Other | Admitting: *Deleted

## 2021-12-27 DIAGNOSIS — R531 Weakness: Secondary | ICD-10-CM

## 2021-12-27 DIAGNOSIS — N3000 Acute cystitis without hematuria: Secondary | ICD-10-CM

## 2021-12-27 DIAGNOSIS — R5383 Other fatigue: Secondary | ICD-10-CM

## 2021-12-27 DIAGNOSIS — J449 Chronic obstructive pulmonary disease, unspecified: Secondary | ICD-10-CM

## 2021-12-27 DIAGNOSIS — I10 Essential (primary) hypertension: Secondary | ICD-10-CM

## 2021-12-27 NOTE — Patient Instructions (Addendum)
Visit Information ? ?Thank you for taking time to visit with me today. Please don't hesitate to contact me if I can be of assistance to you before our next scheduled telephone appointment. ? ?Following are the goals we discussed today:  ?Identify and avoid  triggers ?Identify and remove indoor air pollutants ?Limit outdoor activity during cold weather  ?Rinse mouth after inhaler use ?Continue dry mouth treatment with sips of water, hard candy, chewing gum ?check blood pressure daily ?Write blood pressure results in a log  ?Take log to medical appointments for provider review ?Low salt diet ?Fall precautions and preventions ?Drink plenty of fluids ?Participate with home therapy ? ?Our next appointment is by telephone on 5/3 at 1300 ? ?Please call the care guide team at [phone] if you need to cancel or reschedule your appointment.  ? ?If you are experiencing a Mental Health or [place] or need someone to talk to, please call the Suicide and Crisis Lifeline: 988 ?call the [place] National Suicide Prevention Lifeline: (289) 881-6455 or TTY: (289) 887-6838 TTY 551-106-8923) to talk to a trained counselor ?call 1-800-273-TALK (toll free, 24 hour hotline) ?call 911  ? ?Patient verbalizes understanding of instructions and care plan provided today and agrees to view in [place]. Active MyChart status confirmed with patient.   ? ?Hubert Azure RN, MSN ?RN Care Management Coordinator ?[facility] ?[phone] ?[email or web address] ? ?

## 2021-12-27 NOTE — Chronic Care Management (AMB) (Signed)
?Chronic Care Management  ? ?CCM RN Visit Note ? ?12/27/2021 ?Name: [name] MRN: [id number] DOB: 07-Mar-1926 ? ?Subjective: ?[name] is a 88 y.o. year old female who is a primary care patient of Tullo, Aris Everts, MD. The care management team was consulted for assistance with disease management and care coordination needs.   ? ?Engaged with patient by telephone for follow up visit in response to provider referral for case management and/or care coordination services.  ? ?Consent to Services:  ?The patient was given information about Chronic Care Management services, agreed to services, and gave verbal consent prior to initiation of services.  Please see initial visit note for detailed documentation.  ? ?Patient agreed to services and verbal consent obtained.  ? ?Assessment: Review of patient past medical history, allergies, medications, health status, including review of consultants reports, laboratory and other test data, was performed as part of comprehensive evaluation and provision of chronic care management services.  ? ?SDOH (Social Determinants of Health) assessments and interventions performed:   ? ?CCM Care Plan ? ?No Known Allergies ? ?Outpatient Encounter Medications as of 12/27/2021  ?Medication Sig Note  ? ALPRAZolam (XANAX) 0.25 MG tablet Take 1 tablet (0.25 mg total) by mouth at bedtime as needed.   ? atorvastatin (LIPITOR) 40 MG tablet TAKE 1 TABLET BY MOUTH DAILY   ? Bacillus Coagulans-Inulin (PROBIOTIC) 1-250 BILLION-MG CAPS Take 1 capsule by mouth daily.   ? clopidogrel (PLAVIX) 75 MG tablet TAKE 1 TABLET BY MOUTH DAILY   ? Cranberry 125 MG TABS Take 1 tablet by mouth daily.   ? esomeprazole (NEXIUM) 40 MG capsule Take 40 mg by mouth daily at 12 noon.   ? furosemide (LASIX) 20 MG tablet TAKE 1 TABLET BY MOUTH 1-2 DAYS PER WEEKAS NEEDED   ? Ipratropium-Albuterol (COMBIVENT RESPIMAT) 20-100 MCG/ACT AERS respimat INHALE ONE PUFF EVERY SIX HOURS   ? isosorbide mononitrate (IMDUR) 60 MG  24 hr tablet TAKE 1 AND 1/2 TABLETS BY MOUTH TWO TIMES DAILY   ? losartan (COZAAR) 100 MG tablet TAKE 1 TABLET BY MOUTH DAILY   ? magic mouthwash (lidocaine, diphenhydrAMINE, alum & mag hydroxide) suspension Swish and spit 5 mLs 4 (four) times daily as needed for mouth pain.   ? nitroGLYCERIN (NITROSTAT) 0.4 MG SL tablet PLACE 1 TABLET UNDER TONGUE EVERY 5 MIN AS NEEDED FOR CHEST PAIN IF NO RELIEF IN15 MIN CALL 911 (MAX 3 TABS)   ? nystatin (MYCOSTATIN) 100000 UNIT/ML suspension Take 5 mLs (500,000 Units total) by mouth 4 (four) times daily.   ? ondansetron (ZOFRAN) 4 MG tablet Take 1 tablet (4 mg total) by mouth every 8 (eight) hours as needed for nausea or vomiting.   ? Probiotic Product (PROBIOTIC PO) Take 1 capsule by mouth daily.   ? propranolol (INDERAL) 20 MG tablet TAKE ONE TABLET 3 TIMES DAILY AS NEEDED FOR TACHYCARDIA 03/15/2021: Hasn't needed in long time per patient  ? ranolazine (RANEXA) 500 MG 12 hr tablet TAKE 1 TABLET BY MOUTH TWICE DAILY   ? vitamin B-12 (CYANOCOBALAMIN) 500 MCG tablet Take 500 mcg by mouth daily.   ? ?No facility-administered encounter medications on file as of 12/27/2021.  ? ? ?Patient Active Problem List  ? Diagnosis Date Noted  ? Generalized muscle weakness 12/09/2021  ? Arrhythmia 12/08/2021  ? 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 Oral thrush 11/22/2021  ? Mild cognitive impairment with memory loss 10/12/2021  ? Hyperparathyroidism due to renal insufficiency ([place]) 02/02/2021  ? CKD (chronic  kidney disease) stage 3, GFR 30-59 ml/min (HCC) 01/15/2021  ? Grief 04/08/2020  ? History of esophageal stricture 04/08/2020  ? Leg swelling 03/09/2020  ? Atypical chest pain 01/10/2020  ? B12 deficiency 01/09/2020  ? Mild malnutrition ([place]) 03/25/2019  ? Diastolic CHF, chronic ([place]) 03/25/2019  ? Vitamin D deficiency 03/25/2019  ? S/P excision of skin lesion, follow-up exam 12/26/2017  ? Impingement syndrome of left shoulder region 07/04/2017  ? Allergic rhinitis 12/31/2016  ? Osteoporosis of  forearm 02/13/2016  ? Insomnia secondary to anxiety 07/12/2015  ? Anxiety disorder 07/12/2015  ? Essential (primary) hypertension 10/07/2014  ? Sinus bradycardia   ? Hardening of the aorta (main artery of the heart) ([place]) 06/26/2014  ? History of Clostridium difficile colitis 10/20/2013  ? History of pancreatitis 10/20/2013  ? Low back pain 10/04/2013  ? Do not resuscitate discussion 02/07/2013  ? Chest pain at rest 12/26/2011  ? COPD, moderate ([place]) 12/05/2011  ? Presence of aortocoronary bypass graft   ? Stage 3b chronic kidney disease ([place]) 09/25/2011  ? Hyperlipidemia   ? SOB (shortness of breath)   ? Coronary artery disease of native artery of native heart with stable angina pectoris ([place])   ? Carotid artery disease ([place])   ? ? ?Conditions to be addressed/monitored:HTN and COPD ? ?Care Plan : COPD (Adult)  ?Updates made by Leona Singleton, RN since 12/27/2021 12:00 AM  ?  ? ?Problem: Symptom Exacerbation (COPD/HTN)   ?Priority: Medium  ?  ? ?Long-Range Goal: Symptom Exacerbation Prevented or Minimized  (COPD/HTN)   ?Start Date: 03/15/2021  ?Expected End Date: 03/15/2022  ?Priority: Medium  ?Note:   ?Current Barriers:  ?Knowledge deficits related to basic understanding of COPD disease process ?Knowledge deficits related to basic COPD self care/management history of COPD; currently recovering from uti.  Completed antibiotics today.  Still has the oral thrush, encouraged to continue treatment provided by provider and to add yogurt to her diet, contact provider if mouth does not get better.  Reports she continues to be real weak  confirmed she has assistance at home.  Reports blood pressures have ranged 160/70'S.  Denies any falls or sob.  Discussed drinking more fluids to increase hydration ?3/17--recent ed visit for continued weakness.  Denies fall or sob.  Now reporting no one stays overnight with her, but if need be her daughter can.  Encouraged to discuss with daughter.  Continues to complain of mouth burning  and sore even with the various mouth washes.  Reports trying to stay hydrated but continues to be very weak, hard to care for self.  Rncm assisted patient to call pcp office for f/u appointment  to discuss hh vs op therapy ?4/10--States she feels some better, still with periods of nausea and still very weak.  At home alone and can independently get to bathroom and bed.  Toleratig food and drink in small quantities.  States her daughter has spoken to someone about therapy.  Denies any increase in SOB, edema, chest pains or falls.  Does endorse some lightheadiness and dizziness with BP readings of 120-150/60-90's.  Fall precautions and preventions reviewed and discussed; encouraged to stay hydrated ?Case Manager Clinical Goal(s): ?patient will report using inhalers as prescribed including rinsing mouth after use ?patient will verbalize understanding of COPD action plan and when to seek appropriate levels of medical care ?patient will verbalize basic understanding of COPD disease process and self care activities  ? ?COPD: (Status: Goal on Track (progressing): YES.) Long Term Goal  ?  Interventions:  ?Collaboration with [name], MD regarding development and update of comprehensive plan of care as evidenced by provider attestation and co-signature ?Inter-disciplinary care team collaboration (see longitudinal plan of care) ?Provided patient with basic written and verbal COPD education on self care/management/and exacerbation prevention  ?Provided patient with COPD action plan and reinforced importance of daily self assessment ?Provided instruction about proper use of medications used for management of COPD including inhalers ?Advised patient to self assesses COPD action plan zone and make appointment with provider if in the yellow zone for 48 hours without improvement. ?Provided education about and advised patient to utilize infection prevention strategies to reduce risk of respiratory infection  ?Healthy lifestyle  promoted and encouraged to develop a rescue plan ?Rescue (action) plan reviewed and signs/symptoms of worsening disease assessed; encouraged to follow rescue plan if symptoms flare-up ?Discussed elimi

## 2021-12-28 DIAGNOSIS — Z8781 Personal history of (healed) traumatic fracture: Secondary | ICD-10-CM | POA: Diagnosis not present

## 2021-12-28 DIAGNOSIS — Z87891 Personal history of nicotine dependence: Secondary | ICD-10-CM | POA: Diagnosis not present

## 2021-12-28 DIAGNOSIS — I129 Hypertensive chronic kidney disease with stage 1 through stage 4 chronic kidney disease, or unspecified chronic kidney disease: Secondary | ICD-10-CM | POA: Diagnosis not present

## 2021-12-28 DIAGNOSIS — J449 Chronic obstructive pulmonary disease, unspecified: Secondary | ICD-10-CM | POA: Diagnosis not present

## 2021-12-28 DIAGNOSIS — R5383 Other fatigue: Secondary | ICD-10-CM | POA: Diagnosis not present

## 2021-12-28 DIAGNOSIS — R42 Dizziness and giddiness: Secondary | ICD-10-CM | POA: Diagnosis not present

## 2021-12-28 DIAGNOSIS — N1832 Chronic kidney disease, stage 3b: Secondary | ICD-10-CM | POA: Diagnosis not present

## 2021-12-28 DIAGNOSIS — K219 Gastro-esophageal reflux disease without esophagitis: Secondary | ICD-10-CM | POA: Diagnosis not present

## 2021-12-28 DIAGNOSIS — Z951 Presence of aortocoronary bypass graft: Secondary | ICD-10-CM | POA: Diagnosis not present

## 2021-12-28 DIAGNOSIS — E785 Hyperlipidemia, unspecified: Secondary | ICD-10-CM | POA: Diagnosis not present

## 2021-12-28 DIAGNOSIS — N3000 Acute cystitis without hematuria: Secondary | ICD-10-CM | POA: Diagnosis not present

## 2021-12-28 DIAGNOSIS — Z85828 Personal history of other malignant neoplasm of skin: Secondary | ICD-10-CM | POA: Diagnosis not present

## 2021-12-28 DIAGNOSIS — Z9181 History of falling: Secondary | ICD-10-CM | POA: Diagnosis not present

## 2021-12-28 DIAGNOSIS — I2510 Atherosclerotic heart disease of native coronary artery without angina pectoris: Secondary | ICD-10-CM | POA: Diagnosis not present

## 2021-12-28 DIAGNOSIS — Z87442 Personal history of urinary calculi: Secondary | ICD-10-CM | POA: Diagnosis not present

## 2021-12-28 DIAGNOSIS — R001 Bradycardia, unspecified: Secondary | ICD-10-CM | POA: Diagnosis not present

## 2022-01-03 ENCOUNTER — Encounter: Payer: Self-pay | Admitting: Internal Medicine

## 2022-01-03 DIAGNOSIS — K5790 Diverticulosis of intestine, part unspecified, without perforation or abscess without bleeding: Secondary | ICD-10-CM | POA: Insufficient documentation

## 2022-01-03 DIAGNOSIS — I700 Atherosclerosis of aorta: Secondary | ICD-10-CM | POA: Insufficient documentation

## 2022-01-03 DIAGNOSIS — K449 Diaphragmatic hernia without obstruction or gangrene: Secondary | ICD-10-CM | POA: Insufficient documentation

## 2022-01-04 DIAGNOSIS — N3000 Acute cystitis without hematuria: Secondary | ICD-10-CM | POA: Diagnosis not present

## 2022-01-04 DIAGNOSIS — N1832 Chronic kidney disease, stage 3b: Secondary | ICD-10-CM | POA: Diagnosis not present

## 2022-01-04 DIAGNOSIS — R5383 Other fatigue: Secondary | ICD-10-CM | POA: Diagnosis not present

## 2022-01-04 DIAGNOSIS — I129 Hypertensive chronic kidney disease with stage 1 through stage 4 chronic kidney disease, or unspecified chronic kidney disease: Secondary | ICD-10-CM | POA: Diagnosis not present

## 2022-01-04 DIAGNOSIS — R42 Dizziness and giddiness: Secondary | ICD-10-CM | POA: Diagnosis not present

## 2022-01-04 DIAGNOSIS — R001 Bradycardia, unspecified: Secondary | ICD-10-CM | POA: Diagnosis not present

## 2022-01-06 ENCOUNTER — Other Ambulatory Visit (INDEPENDENT_AMBULATORY_CARE_PROVIDER_SITE_OTHER): Payer: Medicare Other

## 2022-01-06 DIAGNOSIS — E782 Mixed hyperlipidemia: Secondary | ICD-10-CM

## 2022-01-06 DIAGNOSIS — N1832 Chronic kidney disease, stage 3b: Secondary | ICD-10-CM | POA: Diagnosis not present

## 2022-01-06 LAB — TSH: TSH: 5.39 u[IU]/mL (ref 0.35–5.50)

## 2022-01-06 LAB — COMPREHENSIVE METABOLIC PANEL
ALT: 18 U/L (ref 0–35)
AST: 15 U/L (ref 0–37)
Albumin: 3.9 g/dL (ref 3.5–5.2)
Alkaline Phosphatase: 58 U/L (ref 39–117)
BUN: 20 mg/dL (ref 6–23)
CO2: 27 mEq/L (ref 19–32)
Calcium: 8.4 mg/dL (ref 8.4–10.5)
Chloride: 100 mEq/L (ref 96–112)
Creatinine, Ser: 1.37 mg/dL — ABNORMAL HIGH (ref 0.40–1.20)
GFR: 33.31 mL/min — ABNORMAL LOW (ref >60.00)
Glucose, Bld: 100 mg/dL — ABNORMAL HIGH (ref 70–99)
Potassium: 4.5 mEq/L (ref 3.5–5.1)
Sodium: 136 mEq/L (ref 135–145)
Total Bilirubin: 0.8 mg/dL (ref 0.2–1.2)
Total Protein: 5.8 g/dL — ABNORMAL LOW (ref 6.0–8.3)

## 2022-01-06 LAB — LIPID PANEL
Cholesterol: 138 mg/dL (ref 0–200)
HDL: 59.5 mg/dL (ref >39.00)
LDL Cholesterol: 60 mg/dL (ref 0–99)
NonHDL: 78.79
Total CHOL/HDL Ratio: 2
Triglycerides: 93 mg/dL (ref 0.0–149.0)
VLDL: 18.6 mg/dL (ref 0.0–40.0)

## 2022-01-06 LAB — CBC WITH DIFFERENTIAL/PLATELET
Basophils Absolute: 0 10*3/uL (ref 0.0–0.1)
Basophils Relative: 0.7 % (ref 0.0–3.0)
Eosinophils Absolute: 0.2 10*3/uL (ref 0.0–0.7)
Eosinophils Relative: 2.4 % (ref 0.0–5.0)
HCT: 36.6 % (ref 36.0–46.0)
Hemoglobin: 12.2 g/dL (ref 12.0–15.0)
Lymphocytes Relative: 16.1 % (ref 12.0–46.0)
Lymphs Abs: 1.2 10*3/uL (ref 0.7–4.0)
MCHC: 33.2 g/dL (ref 30.0–36.0)
MCV: 104.2 fl — ABNORMAL HIGH (ref 78.0–100.0)
Monocytes Absolute: 0.9 10*3/uL (ref 0.1–1.0)
Monocytes Relative: 12.5 % — ABNORMAL HIGH (ref 3.0–12.0)
Neutro Abs: 4.9 10*3/uL (ref 1.4–7.7)
Neutrophils Relative %: 68.3 % (ref 43.0–77.0)
Platelets: 159 10*3/uL (ref 150.0–400.0)
RBC: 3.52 Mil/uL — ABNORMAL LOW (ref 3.87–5.11)
RDW: 12.6 % (ref 11.5–15.5)
WBC: 7.2 10*3/uL (ref 4.0–10.5)

## 2022-01-10 ENCOUNTER — Encounter: Payer: Self-pay | Admitting: Internal Medicine

## 2022-01-10 ENCOUNTER — Ambulatory Visit (INDEPENDENT_AMBULATORY_CARE_PROVIDER_SITE_OTHER): Payer: Medicare Other | Admitting: Internal Medicine

## 2022-01-10 DIAGNOSIS — F5105 Insomnia due to other mental disorder: Secondary | ICD-10-CM

## 2022-01-10 DIAGNOSIS — G3184 Mild cognitive impairment, so stated: Secondary | ICD-10-CM

## 2022-01-10 DIAGNOSIS — I872 Venous insufficiency (chronic) (peripheral): Secondary | ICD-10-CM | POA: Diagnosis not present

## 2022-01-10 DIAGNOSIS — F419 Anxiety disorder, unspecified: Secondary | ICD-10-CM | POA: Diagnosis not present

## 2022-01-10 DIAGNOSIS — M6281 Muscle weakness (generalized): Secondary | ICD-10-CM | POA: Diagnosis not present

## 2022-01-10 DIAGNOSIS — K449 Diaphragmatic hernia without obstruction or gangrene: Secondary | ICD-10-CM

## 2022-01-10 DIAGNOSIS — I6523 Occlusion and stenosis of bilateral carotid arteries: Secondary | ICD-10-CM | POA: Diagnosis not present

## 2022-01-10 MED ORDER — FUROSEMIDE 20 MG PO TABS: ORAL_TABLET | ORAL | 3 refills | Status: DC

## 2022-01-10 MED ORDER — ONDANSETRON HCL 4 MG PO TABS: 4.0000 mg | ORAL_TABLET | Freq: Three times a day (TID) | ORAL | 3 refills | Status: DC | PRN

## 2022-01-10 MED ORDER — MIRTAZAPINE 7.5 MG PO TABS: 7.5000 mg | ORAL_TABLET | Freq: Every day | ORAL | 1 refills | Status: DC

## 2022-01-10 MED ORDER — ESOMEPRAZOLE MAGNESIUM 40 MG PO CPDR: 40.0000 mg | DELAYED_RELEASE_CAPSULE | Freq: Every morning | ORAL | 0 refills | Status: DC

## 2022-01-10 NOTE — Assessment & Plan Note (Signed)
Complicated by dehydration and deconditioning .  Continue PT ?

## 2022-01-10 NOTE — Assessment & Plan Note (Signed)
Secondary to vascular dementia  ?

## 2022-01-10 NOTE — Progress Notes (Signed)
? ?Subjective:  ?Patient ID: [name], female    DOB: July 13, 1926  Age: 88 y.o. MRN: [id number] ? ?CC: Diagnoses of Insomnia secondary to anxiety, Venous insufficiency of both lower extremities, Mild cognitive impairment with memory loss, Hiatal hernia, and Generalized muscle weakness were pertinent to this visit. ? ? ?This visit occurred during the SARS-CoV-2 public health emergency.  Safety protocols were in place, including screening questions prior to the visit, additional usage of staff PPE, and extensive cleaning of exam room while observing appropriate contact time as indicated for disinfecting solutions.   ? ?HP, eem ?[name] presents for follow up on multiple issues:  dizziness, weakness,  insomnia , edema , and nausea . ? ?Chief Complaint  ?Patient presents with  ? Follow-up  ?  3 month follow up   ? ?Since her last visit she was evaluated [place] for dizziness and weakness and underwent imaging with a CT head,  abdomen and pelvis,  all of which were negative for acute changes.   ? ?1) WEAKNESS:  she has been receiving PT at [facility] since last week.  ? ?1) INSOMNIA :   in bed by 10 pm and falls asleep but wakes up habitually  at 2 pm.  She has been  using alprazolam prn early wakeups.    Discussed changing to mirtazipine given concurrent anorexia and vascular dementia  ? ?  ?5) Ischemic CM: taking Ranexa, lasix an d Imdur 90 mg bid.  Taking lasix every 3 days , prescribed by Dr Rockey Situ for leg swelling.  Legs more swollen  lately . Not wearing compression stockings   EF 50 to 55% by last ECHO  ? ?6) recurrent nausea   occurs in the middle of the night, improved by eating . Forces herself to eat . Has not been taking nexium   has a large hiatal hernia by recent CT  ? ?7) vascular dementia :  suggested by inability to manage medications, changes on head CT  ? ? ?Outpatient Medications Prior to Visit  ?Medication Sig Dispense Refill  ? atorvastatin (LIPITOR) 40 MG tablet TAKE 1 TABLET BY MOUTH DAILY  90 tablet 1  ? Bacillus Coagulans-Inulin (PROBIOTIC) 1-250 BILLION-MG CAPS Take 1 capsule by mouth daily. 30 capsule 0  ? clopidogrel (PLAVIX) 75 MG tablet TAKE 1 TABLET BY MOUTH DAILY 90 tablet 0  ? Cranberry 125 MG TABS Take 1 tablet by mouth daily.    ? Ipratropium-Albuterol (COMBIVENT RESPIMAT) 20-100 MCG/ACT AERS respimat INHALE ONE PUFF EVERY SIX HOURS 4 g 5  ? isosorbide mononitrate (IMDUR) 60 MG 24 hr tablet TAKE 1 AND 1/2 TABLETS BY MOUTH TWO TIMES DAILY 270 tablet 3  ? losartan (COZAAR) 100 MG tablet TAKE 1 TABLET BY MOUTH DAILY 90 tablet 3  ? magic mouthwash (lidocaine, diphenhydrAMINE, alum & mag hydroxide) suspension Swish and spit 5 mLs 4 (four) times daily as needed for mouth pain. 360 mL 1  ? nitroGLYCERIN (NITROSTAT) 0.4 MG SL tablet PLACE 1 TABLET UNDER TONGUE EVERY 5 MIN AS NEEDED FOR CHEST PAIN IF NO RELIEF IN15 MIN CALL 911 (MAX 3 TABS) 25 tablet 3  ? nystatin (MYCOSTATIN) 100000 UNIT/ML suspension Take 5 mLs (500,000 Units total) by mouth 4 (four) times daily. 60 mL 0  ? Probiotic Product (PROBIOTIC PO) Take 1 capsule by mouth daily.    ? propranolol (INDERAL) 20 MG tablet TAKE ONE TABLET 3 TIMES DAILY AS NEEDED FOR TACHYCARDIA 270 tablet 3  ? ranolazine (RANEXA) 500 MG 12  hr tablet TAKE 1 TABLET BY MOUTH TWICE DAILY 180 tablet 3  ? vitamin B-12 (CYANOCOBALAMIN) 500 MCG tablet Take 500 mcg by mouth daily.    ? ALPRAZolam (XANAX) 0.25 MG tablet Take 1 tablet (0.25 mg total) by mouth at bedtime as needed. 30 tablet 5  ? esomeprazole (NEXIUM) 40 MG capsule Take 40 mg by mouth daily at 12 noon.    ? furosemide (LASIX) 20 MG tablet TAKE 1 TABLET BY MOUTH 1-2 DAYS PER WEEKAS NEEDED 10 tablet 0  ? ondansetron (ZOFRAN) 4 MG tablet Take 1 tablet (4 mg total) by mouth every 8 (eight) hours as needed for nausea or vomiting. 40 tablet 0  ? ?No facility-administered medications prior to visit.  ? ? ?Review of Systems; ? ?Patient denies headache, fevers, malaise, unintentional weight loss, skin rash, eye  pain, sinus congestion and sinus pain, sore throat, dysphagia,  hemoptysis , cough, dyspnea, wheezing, chest pain, palpitations, orthopnea, edema, abdominal pain, nausea, melena, diarrhea, constipation, flank pain, dysuria, hematuria, urinary  Frequency, nocturia, numbness, tingling, seizures,  Focal weakness, Loss of consciousness,  Tremor, insomnia, depression, anxiety, and suicidal ideation.   ? ? ? ?Objective:  ?BP (!) 158/76 (BP Location: Left Arm, Patient Position: Sitting, Cuff Size: Normal)   Pulse 69   Temp 97.9 ?F (36.6 ?C) (Oral)   Ht 5' (1.524 m)   Wt 122 lb 9.6 oz (55.6 kg)   SpO2 98%   BMI 23.94 kg/m?  ? ?BP Readings from Last 3 Encounters:  ?01/10/22 (!) 158/76  ?12/21/21 (!) 160/52  ?12/08/21 132/64  ? ? ?Wt Readings from Last 3 Encounters:  ?01/10/22 122 lb 9.6 oz (55.6 kg)  ?12/21/21 119 lb 6.4 oz (54.2 kg)  ?12/08/21 117 lb (53.1 kg)  ? ? ?General appearance: alert, cooperative and appears stated age ?Ears: normal TM's and external ear canals both ears ?Throat: lips, mucosa, and tongue normal; teeth and gums normal ?Neck: no adenopathy, no carotid bruit, supple, symmetrical, trachea midline and thyroid not enlarged, symmetric, no tenderness/mass/nodules ?Back: symmetric, no curvature. ROM normal. No CVA tenderness. ?Lungs: clear to auscultation bilaterally ?Heart: regular rate and rhythm, S1, S2 normal, no murmur, click, rub or gallop ?Abdomen: soft, non-tender; bowel sounds normal; no masses,  no organomegaly ?Pulses: 2+ and symmetric ?Skin: Skin color, texture, turgor normal. No rashes or lesions ?Lymph nodes: Cervical, supraclavicular, and axillary nodes normal. ? ?Lab Results  ?Component Value Date  ? HGBA1C 5.8 01/30/2017  ? HGBA1C 5.8 09/26/2016  ? ? ?Lab Results  ?Component Value Date  ? CREATININE 1.37 (H) 01/06/2022  ? CREATININE 1.43 (H) 11/29/2021  ? CREATININE 1.39 (H) 11/18/2021  ? ? ?Lab Results  ?Component Value Date  ? WBC 7.2 01/06/2022  ? HGB 12.2 01/06/2022  ? HCT 36.6  01/06/2022  ? PLT 159.0 01/06/2022  ? GLUCOSE 100 (H) 01/06/2022  ? CHOL 138 01/06/2022  ? TRIG 93.0 01/06/2022  ? HDL 59.50 01/06/2022  ? LDLDIRECT 72.0 11/09/2015  ? [place] 60 01/06/2022  ? ALT 18 01/06/2022  ? AST 15 01/06/2022  ? NA 136 01/06/2022  ? K 4.5 01/06/2022  ? CL 100 01/06/2022  ? CREATININE 1.37 (H) 01/06/2022  ? BUN 20 01/06/2022  ? CO2 27 01/06/2022  ? TSH 5.39 01/06/2022  ? INR 0.9 01/31/2018  ? HGBA1C 5.8 01/30/2017  ? MICROALBUR 2.5 (H) 12/26/2011  ? ? ?CT ABDOMEN PELVIS WO CONTRAST ? ?Result Date: 12/21/2021 ?CLINICAL DATA:  Unintentional weight loss approximately 12 lb in 1 month. Nausea.  Weakness. Constipation. EXAM: CT ABDOMEN AND PELVIS WITHOUT CONTRAST TECHNIQUE: Multidetector CT imaging of the abdomen and pelvis was performed following the standard protocol without IV contrast. RADIATION DOSE REDUCTION: This exam was performed according to the departmental dose-optimization program which includes automated exposure control, adjustment of the mA and/or kV according to patient size and/or use of iterative reconstruction technique. COMPARISON:  03/29/2017 FINDINGS: Lower chest: No acute findings. Hepatobiliary: No mass visualized on this unenhanced exam. Gallbladder is unremarkable. No evidence of biliary ductal dilatation. Pancreas: No mass or inflammatory process visualized on this unenhanced exam. Spleen:  Within normal limits in size. Adrenals/Urinary tract: Vascular calcification is again noted in the left renal hilum. No evidence of urolithiasis or hydronephrosis. Unremarkable unopacified urinary bladder. Stomach/Bowel: A large hiatal hernia is again seen. No evidence of obstruction, inflammatory process, or abnormal fluid collections. Colonic diverticulosis is seen which is most severe in the descending and sigmoid colon, however there is no evidence of diverticulitis. Vascular/Lymphatic: No pathologically enlarged lymph nodes identified. No evidence of abdominal aortic aneurysm. Aortic  atherosclerotic calcification noted. Reproductive: Prior hysterectomy noted. Adnexal regions are unremarkable in appearance. Other:  None. Musculoskeletal:  No suspicious bone lesions identified. IMPRESSION: No

## 2022-01-10 NOTE — Assessment & Plan Note (Signed)
Large, noted on CT .  Resume nexium  ?

## 2022-01-10 NOTE — Patient Instructions (Addendum)
Your swelling will not respond to furosemide because  it is due to venous insufficiency.  You HAVE to start wearing your compression knee highs EVERY DAY BEFORE YOU START SWELLING . PUT THEM ON BEFORE YOU GET OUT OF BED . REMOVE BEFORE BEDTIME .  DO NOT SLEEP IN THEM.   TAKE YOUR SHOWER AT NIGHT . Do not take furosemide more than once a week. ? ?Your nausea is due to 1) dehydration  2) gastritis  and 3) constipation  ?RESUME TAKING NEXIUM EVERY DAY IN THE MORNING BEFORE BREAKFAST ? ?YOU ARE DEHYDRATED . wORK ON GETTING 50   OUNCES OF FLAVORED WATER DAILY  EVERYTHING COUTNS THAT DOESN'T HAVE ALCOHOL OR CAFFEINE  ? ?DON'T USE DULCOLAX .  TOO STRONG FOR BOWELS.  INSTEAD ADD   BENEFIBER (MY CHOICE) TO YOUR WATER ONCE DAILY FOR YOUR CONSTIPATION  ? ? ?STOP THE ALPRAZOLAM FOR INSOMNIA.  START TAKING MIRTAZIPINE AT BEDTIME STARTING DOSE 7.5 MG  .  WE CAN INCREASE AFTER 2 WEEKS IF NEEDED;  LET ME KNOW ? ?

## 2022-01-10 NOTE — Assessment & Plan Note (Addendum)
Recommend use  Of compression stockings rather than use of lasix  Given apparent dehydration ?

## 2022-01-10 NOTE — Assessment & Plan Note (Addendum)
She is using alprazolam for early wakenings.  Given the  risks and benefits of benzodiazepine use complicated by her vascular dementia  And anorexia, will change to trial of mirtazipine  ?

## 2022-01-11 DIAGNOSIS — R001 Bradycardia, unspecified: Secondary | ICD-10-CM | POA: Diagnosis not present

## 2022-01-11 DIAGNOSIS — R5383 Other fatigue: Secondary | ICD-10-CM | POA: Diagnosis not present

## 2022-01-11 DIAGNOSIS — N1832 Chronic kidney disease, stage 3b: Secondary | ICD-10-CM | POA: Diagnosis not present

## 2022-01-11 DIAGNOSIS — I129 Hypertensive chronic kidney disease with stage 1 through stage 4 chronic kidney disease, or unspecified chronic kidney disease: Secondary | ICD-10-CM | POA: Diagnosis not present

## 2022-01-11 DIAGNOSIS — N3000 Acute cystitis without hematuria: Secondary | ICD-10-CM | POA: Diagnosis not present

## 2022-01-11 DIAGNOSIS — R42 Dizziness and giddiness: Secondary | ICD-10-CM | POA: Diagnosis not present

## 2022-01-16 DIAGNOSIS — J449 Chronic obstructive pulmonary disease, unspecified: Secondary | ICD-10-CM

## 2022-01-16 DIAGNOSIS — I10 Essential (primary) hypertension: Secondary | ICD-10-CM

## 2022-01-17 DIAGNOSIS — H353221 Exudative age-related macular degeneration, left eye, with active choroidal neovascularization: Secondary | ICD-10-CM | POA: Diagnosis not present

## 2022-01-19 ENCOUNTER — Telehealth: Payer: Medicare Other

## 2022-01-19 ENCOUNTER — Telehealth: Payer: Self-pay | Admitting: *Deleted

## 2022-01-19 NOTE — Telephone Encounter (Signed)
?  Care Management  ? ?Follow Up Note ? ? ?01/19/2022 ?Name: [name] MRN: [id number] DOB: [birth year] ? ? ?Referred by: [name], MD ?Reason for referral : Chronic Care Management (Htn, copd) ? ? ?An unsuccessful telephone outreach was attempted today. The patient was referred to the case management team for assistance with care management and care coordination.  ? ?Follow Up Plan: The care management team will reach out to the patient again over the next 30 days.  ? ?Hubert Azure RN, MSN ?RN Care Management Coordinator ?[place] ?[phone] ?[email or web address] ? ?

## 2022-01-20 DIAGNOSIS — R42 Dizziness and giddiness: Secondary | ICD-10-CM | POA: Diagnosis not present

## 2022-01-20 DIAGNOSIS — R5383 Other fatigue: Secondary | ICD-10-CM | POA: Diagnosis not present

## 2022-01-20 DIAGNOSIS — N3000 Acute cystitis without hematuria: Secondary | ICD-10-CM | POA: Diagnosis not present

## 2022-01-20 DIAGNOSIS — I129 Hypertensive chronic kidney disease with stage 1 through stage 4 chronic kidney disease, or unspecified chronic kidney disease: Secondary | ICD-10-CM | POA: Diagnosis not present

## 2022-01-20 DIAGNOSIS — R001 Bradycardia, unspecified: Secondary | ICD-10-CM | POA: Diagnosis not present

## 2022-01-20 DIAGNOSIS — N1832 Chronic kidney disease, stage 3b: Secondary | ICD-10-CM | POA: Diagnosis not present

## 2022-01-27 DIAGNOSIS — R42 Dizziness and giddiness: Secondary | ICD-10-CM | POA: Diagnosis not present

## 2022-01-27 DIAGNOSIS — R001 Bradycardia, unspecified: Secondary | ICD-10-CM | POA: Diagnosis not present

## 2022-01-27 DIAGNOSIS — Z85828 Personal history of other malignant neoplasm of skin: Secondary | ICD-10-CM | POA: Diagnosis not present

## 2022-01-27 DIAGNOSIS — N1832 Chronic kidney disease, stage 3b: Secondary | ICD-10-CM | POA: Diagnosis not present

## 2022-01-27 DIAGNOSIS — Z951 Presence of aortocoronary bypass graft: Secondary | ICD-10-CM | POA: Diagnosis not present

## 2022-01-27 DIAGNOSIS — R5383 Other fatigue: Secondary | ICD-10-CM | POA: Diagnosis not present

## 2022-01-27 DIAGNOSIS — Z8781 Personal history of (healed) traumatic fracture: Secondary | ICD-10-CM | POA: Diagnosis not present

## 2022-01-27 DIAGNOSIS — K219 Gastro-esophageal reflux disease without esophagitis: Secondary | ICD-10-CM | POA: Diagnosis not present

## 2022-01-27 DIAGNOSIS — Z87891 Personal history of nicotine dependence: Secondary | ICD-10-CM | POA: Diagnosis not present

## 2022-01-27 DIAGNOSIS — I2510 Atherosclerotic heart disease of native coronary artery without angina pectoris: Secondary | ICD-10-CM | POA: Diagnosis not present

## 2022-01-27 DIAGNOSIS — J449 Chronic obstructive pulmonary disease, unspecified: Secondary | ICD-10-CM | POA: Diagnosis not present

## 2022-01-27 DIAGNOSIS — Z9181 History of falling: Secondary | ICD-10-CM | POA: Diagnosis not present

## 2022-01-27 DIAGNOSIS — E785 Hyperlipidemia, unspecified: Secondary | ICD-10-CM | POA: Diagnosis not present

## 2022-01-27 DIAGNOSIS — N3000 Acute cystitis without hematuria: Secondary | ICD-10-CM | POA: Diagnosis not present

## 2022-01-27 DIAGNOSIS — Z87442 Personal history of urinary calculi: Secondary | ICD-10-CM | POA: Diagnosis not present

## 2022-01-27 DIAGNOSIS — I129 Hypertensive chronic kidney disease with stage 1 through stage 4 chronic kidney disease, or unspecified chronic kidney disease: Secondary | ICD-10-CM | POA: Diagnosis not present

## 2022-02-01 DIAGNOSIS — R42 Dizziness and giddiness: Secondary | ICD-10-CM | POA: Diagnosis not present

## 2022-02-01 DIAGNOSIS — R5383 Other fatigue: Secondary | ICD-10-CM | POA: Diagnosis not present

## 2022-02-01 DIAGNOSIS — I129 Hypertensive chronic kidney disease with stage 1 through stage 4 chronic kidney disease, or unspecified chronic kidney disease: Secondary | ICD-10-CM | POA: Diagnosis not present

## 2022-02-01 DIAGNOSIS — N1832 Chronic kidney disease, stage 3b: Secondary | ICD-10-CM | POA: Diagnosis not present

## 2022-02-01 DIAGNOSIS — R001 Bradycardia, unspecified: Secondary | ICD-10-CM | POA: Diagnosis not present

## 2022-02-01 DIAGNOSIS — N3000 Acute cystitis without hematuria: Secondary | ICD-10-CM | POA: Diagnosis not present

## 2022-02-02 ENCOUNTER — Telehealth: Payer: Self-pay | Admitting: Internal Medicine

## 2022-02-02 ENCOUNTER — Ambulatory Visit (INDEPENDENT_AMBULATORY_CARE_PROVIDER_SITE_OTHER): Payer: PRIVATE HEALTH INSURANCE | Admitting: *Deleted

## 2022-02-02 DIAGNOSIS — I10 Essential (primary) hypertension: Secondary | ICD-10-CM

## 2022-02-02 DIAGNOSIS — J449 Chronic obstructive pulmonary disease, unspecified: Secondary | ICD-10-CM

## 2022-02-02 NOTE — Patient Instructions (Signed)
Visit Information  Thank you for taking time to visit with me today. Please don't hesitate to contact me if I can be of assistance to you before our next scheduled telephone appointment.  Following are the goals we discussed today:  Identify and avoid  triggers Identify and remove indoor air pollutants Limit outdoor activity during cold weather  Rinse mouth after inhaler use Continue dry mouth treatment with sips of water, hard candy, chewing gum check blood pressure daily Write blood pressure results in a log  Take log to medical appointments for provider review Low salt diet Fall precautions and preventions Drink plenty of fluids Do exercises taught by therapist  Our next appointment is by telephone on 6/16 at 0945  Please call the care guide team at [phone] if you need to cancel or reschedule your appointment.   If you are experiencing a Mental Health or [place] or need someone to talk to, please call the Suicide and Crisis Lifeline: 988 call the [place] National Suicide Prevention Lifeline: 915-112-5198 or TTY: 407 685 8474 TTY 601 061 0891) to talk to a trained counselor call 1-800-273-TALK (toll free, 24 hour hotline) call 911   Patient verbalizes understanding of instructions and care plan provided today and agrees to view in [place]. Active MyChart status and patient understanding of how to access instructions and care plan via MyChart confirmed with patient.     Hubert Azure RN, MSN RN Care Management Coordinator [place] [phone] [email or web address]

## 2022-02-02 NOTE — Telephone Encounter (Signed)
[name] from [facility] home health called stating they are discharging pt from home health physical therapy because the goals are met ?[phone] ?

## 2022-02-02 NOTE — Telephone Encounter (Signed)
FYI

## 2022-02-03 NOTE — Chronic Care Management (AMB) (Signed)
Chronic Care Management   CCM RN Visit Note  02/03/2022 Name: [name] MRN: [id number] DOB: 02-Jan-1926  Subjective: [name] is a 88 y.o. year old female who is a primary care patient of Tullo, Aris Everts, MD. The care management team was consulted for assistance with disease management and care coordination needs.    Engaged with patient by telephone for follow up visit in response to provider referral for case management and/or care coordination services.   Consent to Services:  The patient was given information about Chronic Care Management services, agreed to services, and gave verbal consent prior to initiation of services.  Please see initial visit note for detailed documentation.   Patient agreed to services and verbal consent obtained.   Assessment: Review of patient past medical history, allergies, medications, health status, including review of consultants reports, laboratory and other test data, was performed as part of comprehensive evaluation and provision of chronic care management services.   SDOH (Social Determinants of Health) assessments and interventions performed:    CCM Care Plan  No Known Allergies  Outpatient Encounter Medications as of 02/02/2022  Medication Sig Note   atorvastatin (LIPITOR) 40 MG tablet TAKE 1 TABLET BY MOUTH DAILY    Bacillus Coagulans-Inulin (PROBIOTIC) 1-250 BILLION-MG CAPS Take 1 capsule by mouth daily.    clopidogrel (PLAVIX) 75 MG tablet TAKE 1 TABLET BY MOUTH DAILY    Cranberry 125 MG TABS Take 1 tablet by mouth daily.    esomeprazole (NEXIUM) 40 MG capsule Take 1 capsule (40 mg total) by mouth in the morning.    furosemide (LASIX) 20 MG tablet TAKE 1 TABLET BY MOUTH NOT MORE THAN ONCE A WEEK AS NEEDED    Ipratropium-Albuterol (COMBIVENT RESPIMAT) 20-100 MCG/ACT AERS respimat INHALE ONE PUFF EVERY SIX HOURS    isosorbide mononitrate (IMDUR) 60 MG 24 hr tablet TAKE 1 AND 1/2 TABLETS BY MOUTH TWO TIMES DAILY    losartan  (COZAAR) 100 MG tablet TAKE 1 TABLET BY MOUTH DAILY    magic mouthwash (lidocaine, diphenhydrAMINE, alum & mag hydroxide) suspension Swish and spit 5 mLs 4 (four) times daily as needed for mouth pain.    mirtazapine (REMERON) 7.5 MG tablet Take 1 tablet (7.5 mg total) by mouth at bedtime.    nitroGLYCERIN (NITROSTAT) 0.4 MG SL tablet PLACE 1 TABLET UNDER TONGUE EVERY 5 MIN AS NEEDED FOR CHEST PAIN IF NO RELIEF IN15 MIN CALL 911 (MAX 3 TABS)    nystatin (MYCOSTATIN) 100000 UNIT/ML suspension Take 5 mLs (500,000 Units total) by mouth 4 (four) times daily.    ondansetron (ZOFRAN) 4 MG tablet Take 1 tablet (4 mg total) by mouth every 8 (eight) hours as needed for nausea or vomiting.    Probiotic Product (PROBIOTIC PO) Take 1 capsule by mouth daily.    propranolol (INDERAL) 20 MG tablet TAKE ONE TABLET 3 TIMES DAILY AS NEEDED FOR TACHYCARDIA 03/15/2021: Hasn't needed in long time per patient   ranolazine (RANEXA) 500 MG 12 hr tablet TAKE 1 TABLET BY MOUTH TWICE DAILY    vitamin B-12 (CYANOCOBALAMIN) 500 MCG tablet Take 500 mcg by mouth daily.    No facility-administered encounter medications on file as of 02/02/2022.    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  Oral thrush 11/22/2021   Mild cognitive impairment with memory loss 10/12/2021   Hyperparathyroidism due to renal insufficiency ([place]) 02/02/2021   CKD (chronic kidney disease) stage 3, GFR 30-59 ml/min (HCC) 01/15/2021   Grief 04/08/2020   History of esophageal stricture 04/08/2020   Leg swelling 03/09/2020   Atypical chest pain 01/10/2020   B12 deficiency 01/09/2020   Generalized weakness 07/14/2019   Mild malnutrition ([place]) 80/99/8338   Diastolic CHF, chronic ([place])  03/25/2019   Vitamin D deficiency 03/25/2019   S/P excision of skin lesion, follow-up exam 12/26/2017   Impingement syndrome of left shoulder region 07/04/2017   Allergic rhinitis 12/31/2016   Osteoporosis of forearm 02/13/2016   Insomnia secondary to anxiety 07/12/2015   Anxiety disorder 07/12/2015   Essential (primary) hypertension 10/07/2014   Sinus bradycardia    Hardening of the aorta (main artery of the heart) ([place]) 06/26/2014   History of Clostridium difficile colitis 10/20/2013   History of pancreatitis 10/20/2013   Low back pain 10/04/2013   Do not resuscitate discussion 02/07/2013   Chest pain at rest 12/26/2011   COPD, moderate ([place]) 12/05/2011   Presence of aortocoronary bypass graft    Stage 3b chronic kidney disease ([place]) 09/25/2011   Hyperlipidemia    SOB (shortness of breath)    Coronary artery disease of native artery of native heart with stable angina pectoris ([place])    Carotid artery disease ([place])     Conditions to be addressed/monitored:HTN and COPD  Care Plan : COPD (Adult)  Updates made by Leona Singleton, RN since 02/03/2022 12:00 AM     Problem: Symptom Exacerbation (COPD/HTN)   Priority: Medium     Long-Range Goal: Symptom Exacerbation Prevented or Minimized  (COPD/HTN)   Start Date: 02/02/2022  Expected End Date: 02/03/2023  Priority: Medium  Note:   Current Barriers:  Knowledge deficits related to basic understanding of COPD disease process Knowledge deficits related to basic COPD self care/management history of COPD; currently recovering from uti.  Completed antibiotics today.  Still has the oral thrush, encouraged to continue treatment provided by provider and to add yogurt to her diet, contact provider if mouth does not get better.  Reports she continues to be real weak  confirmed she has assistance at home.  Reports blood pressures have ranged 160/70'S.  Denies any falls or sob.  Discussed drinking more fluids to increase hydration 3/17--recent  ed visit for continued weakness.  Denies fall or sob.  Now reporting no one stays overnight with her, but if need be her daughter can.  Encouraged to discuss with daughter.  Continues to complain of mouth burning and sore even with the various mouth washes.  Reports trying to stay hydrated but continues to be very weak, hard to care for self.  Rncm assisted patient to call pcp office for f/u appointment  to discuss hh vs op therapy 4/10--States she feels some better, still with periods of nausea and still very weak.  At home alone and can independently get to bathroom and bed.  Toleratig food and drink in small quantities.  States her daughter has spoken to someone about therapy.  Denies any increase in SOB, edema, chest pains or falls.  Does endorse some lightheadiness and dizziness with BP readings of 120-150/60-90's.  Fall precautions and preventions reviewed and discussed; encouraged to stay hydrated 5/17--reports to continue to get better; states she finished working with therapy this week.  Says she is still a little weak but feels like therapy did make  her stronger.  Denies any recent falls.  Denies any increase SOB.  States blood pressure have been elevated but does not have readings; feels home blood pressures are higher than at doctors office; feels dry mouth is just a little better Case Manager Clinical Goal(s): patient will report using inhalers as prescribed including rinsing mouth after use patient will verbalize understanding of COPD action plan and when to seek appropriate levels of medical care patient will verbalize basic understanding of COPD disease process and self care activities   COPD: (Status: Goal on Track (progressing): YES.) Long Term Goal  Interventions:  Collaboration with [name], MD regarding development and update of comprehensive plan of care as evidenced by provider attestation and co-signature Inter-disciplinary care team collaboration (see longitudinal plan of  care) Provided patient with basic written and verbal COPD education on self care/management/and exacerbation prevention  Provided patient with COPD action plan and reinforced importance of daily self assessment Provided instruction about proper use of medications used for management of COPD including inhalers Advised patient to self assesses COPD action plan zone and make appointment with provider if in the yellow zone for 48 hours without improvement. Provided education about and advised patient to utilize infection prevention strategies to reduce risk of respiratory infection  Healthy lifestyle promoted and encouraged to develop a rescue plan Rescue (action) plan reviewed and signs/symptoms of worsening disease assessed; encouraged to follow rescue plan if symptoms flare-up Discussed eliminating symptom triggers at home Encouraged to attend all scheduled provider appointments   Instructed to call provider office for new concerns or questions Reviewed and assessed barriers and manage adherence, including inhaler technique and persistent trigger exposure; encourage adherence, even when symptoms are controlled or infrequent, Encouraged to monitor for signs/symptoms of psychosocial concerns, such as shortness of breath-anxiety cycle or depression that may impact stability of symptoms FALL PRECAUTIONS AND PREVENTIONS REVIEWED AND DISCUSSED REVIEWED AND ENCOURAGED CONTINUED USE OF DRY MOUTH TREATMENT AND TO DISCUSS WITH PROVIDER  Hypertension: (Status: Goal on Track (progressing): YES.) Long Term Goal  Last practice recorded BP readings:  BP Readings from Last 3 Encounters:  01/10/22 (!) 158/76  12/21/21 (!) 160/52  12/08/21 132/64  Most recent eGFR/CrCl: No results found for: EGFR  No components found for: CRCL  Evaluation of current treatment plan related to hypertension self management and patient's adherence to plan as established by provider;   Provided education to patient re: stroke  prevention, s/s of heart attack and stroke; Reviewed prescribed diet low salt Reviewed medications with patient and discussed importance of compliance;  Discussed plans with patient for ongoing care management follow up and provided patient with direct contact information for care management team; Advised patient, providing education and rationale, to monitor blood pressure daily and record, calling PCP for findings outside established parameters;  Discussed complications of poorly controlled blood pressure such as heart disease, stroke, circulatory complications, vision complications, kidney impairment, sexual dysfunction;   Discussed importance of monitoring blood pressure daily and notifying provider for sustained elevations or hypotensive episodes Encouraged to take home BP machine to next provider appointment and let staff assess machine and compare readings  Patient Goals/Self-Care Activities: : Identify and avoid  triggers Identify and remove indoor air pollutants Limit outdoor activity during cold weather  Rinse mouth after inhaler use Continue dry mouth treatment with sips of water, hard candy, chewing gum check blood pressure daily Write blood pressure results in a log  Take log to medical appointments for provider review Low salt diet  Fall precautions and preventions Drink plenty of fluids Do exercises taught by therapist Follow Up Plan: The care management team will reach out to the patient again over the next 45 business days.       Plan:The care management team will reach out to the patient again over the next 45 days.  Hubert Azure RN, MSN RN Care Management Coordinator [place] [phone] [email or web address]

## 2022-02-07 ENCOUNTER — Ambulatory Visit (INDEPENDENT_AMBULATORY_CARE_PROVIDER_SITE_OTHER): Payer: Medicare Other | Admitting: Internal Medicine

## 2022-02-07 ENCOUNTER — Encounter: Payer: Self-pay | Admitting: Internal Medicine

## 2022-02-07 ENCOUNTER — Other Ambulatory Visit: Payer: Self-pay | Admitting: Cardiovascular Disease

## 2022-02-07 DIAGNOSIS — F5105 Insomnia due to other mental disorder: Secondary | ICD-10-CM | POA: Diagnosis not present

## 2022-02-07 DIAGNOSIS — I6523 Occlusion and stenosis of bilateral carotid arteries: Secondary | ICD-10-CM

## 2022-02-07 DIAGNOSIS — F419 Anxiety disorder, unspecified: Secondary | ICD-10-CM

## 2022-02-07 DIAGNOSIS — I700 Atherosclerosis of aorta: Secondary | ICD-10-CM | POA: Diagnosis not present

## 2022-02-07 DIAGNOSIS — E441 Mild protein-calorie malnutrition: Secondary | ICD-10-CM

## 2022-02-07 MED ORDER — MIRTAZAPINE 15 MG PO TABS: 15.0000 mg | ORAL_TABLET | Freq: Every day | ORAL | 2 refills | Status: DC

## 2022-02-07 NOTE — Progress Notes (Unsigned)
Subjective:  Patient ID: [name], female    DOB: [birth year]  Age: 88 y.o. MRN: [id number]  CC: There were no encounter diagnoses.   HPI [name] presents for  Chief Complaint  Patient presents with   Follow-up    1 month follow up on insomnia due to anxiety   1) INSOMNIA:  SWITCHED FROM ALPRAZOLAM TO MIRTAZaPINE DUE TO COGNITIVE DECLINE   2) RECENT SIGHTING OF BLOOD IN UNDERWEAR , NOTED ON LAUNDRY DAY . Has not seen any since the  3) tolerating remeron 7.5 mg sleepin well but still losing weight dose increased  4) Using compression stockings  5) Doing pt at home . Legs getting stronger . Wants to go upstairs to bedroom  Outpatient Medications Prior to Visit  Medication Sig Dispense Refill   atorvastatin (LIPITOR) 40 MG tablet TAKE 1 TABLET BY MOUTH DAILY 90 tablet 1   Bacillus Coagulans-Inulin (PROBIOTIC) 1-250 BILLION-MG CAPS Take 1 capsule by mouth daily. 30 capsule 0   clopidogrel (PLAVIX) 75 MG tablet TAKE 1 TABLET BY MOUTH DAILY 90 tablet 0   Cranberry 125 MG TABS Take 1 tablet by mouth daily.     esomeprazole (NEXIUM) 40 MG capsule Take 1 capsule (40 mg total) by mouth in the morning. 30 capsule 0   furosemide (LASIX) 20 MG tablet TAKE 1 TABLET BY MOUTH NOT MORE THAN ONCE A WEEK AS NEEDED 12 tablet 3   Ipratropium-Albuterol (COMBIVENT RESPIMAT) 20-100 MCG/ACT AERS respimat INHALE ONE PUFF EVERY SIX HOURS 4 g 5   isosorbide mononitrate (IMDUR) 60 MG 24 hr tablet TAKE 1 AND 1/2 TABLETS BY MOUTH TWO TIMES DAILY 270 tablet 3   losartan (COZAAR) 100 MG tablet TAKE 1 TABLET BY MOUTH DAILY 90 tablet 3   magic mouthwash (lidocaine, diphenhydrAMINE, alum & mag hydroxide) suspension Swish and spit 5 mLs 4 (four) times daily as needed for mouth pain. 360 mL 1   mirtazapine (REMERON) 7.5 MG tablet Take 1 tablet (7.5 mg total) by mouth at bedtime. 30 tablet 1   nitroGLYCERIN (NITROSTAT) 0.4 MG SL tablet PLACE 1 TABLET UNDER TONGUE EVERY 5 MIN AS NEEDED FOR CHEST PAIN  IF NO RELIEF IN15 MIN CALL 911 (MAX 3 TABS) 25 tablet 3   nystatin (MYCOSTATIN) 100000 UNIT/ML suspension Take 5 mLs (500,000 Units total) by mouth 4 (four) times daily. 60 mL 0   ondansetron (ZOFRAN) 4 MG tablet Take 1 tablet (4 mg total) by mouth every 8 (eight) hours as needed for nausea or vomiting. 40 tablet 3   Probiotic Product (PROBIOTIC PO) Take 1 capsule by mouth daily.     propranolol (INDERAL) 20 MG tablet TAKE ONE TABLET 3 TIMES DAILY AS NEEDED FOR TACHYCARDIA 270 tablet 3   ranolazine (RANEXA) 500 MG 12 hr tablet TAKE 1 TABLET BY MOUTH TWICE DAILY 180 tablet 3   vitamin B-12 (CYANOCOBALAMIN) 500 MCG tablet Take 500 mcg by mouth daily.     No facility-administered medications prior to visit.    Review of Systems;  Patient denies headache, fevers, malaise, unintentional weight loss, skin rash, eye pain, sinus congestion and sinus pain, sore throat, dysphagia,  hemoptysis , cough, dyspnea, wheezing, chest pain, palpitations, orthopnea, edema, abdominal pain, nausea, melena, diarrhea, constipation, flank pain, dysuria, hematuria, urinary  Frequency, nocturia, numbness, tingling, seizures,  Focal weakness, Loss of consciousness,  Tremor, insomnia, depression, anxiety, and suicidal ideation.      Objective:  BP (!) 150/70 (BP Location: Left Arm, Patient Position: Sitting, Cuff  Size: Normal)   Pulse 82   Temp 97.9 F (36.6 C) (Oral)   Ht 5' (1.524 m)   Wt 115 lb 6.4 oz (52.3 kg)   SpO2 94%   BMI 22.54 kg/m   BP Readings from Last 3 Encounters:  02/07/22 (!) 150/70  01/10/22 (!) 158/76  12/21/21 (!) 160/52    Wt Readings from Last 3 Encounters:  02/07/22 115 lb 6.4 oz (52.3 kg)  01/10/22 122 lb 9.6 oz (55.6 kg)  12/21/21 119 lb 6.4 oz (54.2 kg)    General appearance: alert, cooperative and appears stated age Ears: normal TM's and external ear canals both ears Throat: lips, mucosa, and tongue normal; teeth and gums normal Neck: no adenopathy, no carotid bruit, supple,  symmetrical, trachea midline and thyroid not enlarged, symmetric, no tenderness/mass/nodules Back: symmetric, no curvature. ROM normal. No CVA tenderness. Lungs: clear to auscultation bilaterally Heart: regular rate and rhythm, S1, S2 normal, no murmur, click, rub or gallop Abdomen: soft, non-tender; bowel sounds normal; no masses,  no organomegaly Pulses: 2+ and symmetric Skin: Skin color, texture, turgor normal. No rashes or lesions Lymph nodes: Cervical, supraclavicular, and axillary nodes normal.  Lab Results  Component Value Date   HGBA1C 5.8 01/30/2017   HGBA1C 5.8 09/26/2016    Lab Results  Component Value Date   CREATININE 1.37 (H) 01/06/2022   CREATININE 1.43 (H) 11/29/2021   CREATININE 1.39 (H) 11/18/2021    Lab Results  Component Value Date   WBC 7.2 01/06/2022   HGB 12.2 01/06/2022   HCT 36.6 01/06/2022   PLT 159.0 01/06/2022   GLUCOSE 100 (H) 01/06/2022   CHOL 138 01/06/2022   TRIG 93.0 01/06/2022   HDL 59.50 01/06/2022   LDLDIRECT 72.0 11/09/2015   LDLCALC 60 01/06/2022   ALT 18 01/06/2022   AST 15 01/06/2022   NA 136 01/06/2022   K 4.5 01/06/2022   CL 100 01/06/2022   CREATININE 1.37 (H) 01/06/2022   BUN 20 01/06/2022   CO2 27 01/06/2022   TSH 5.39 01/06/2022   INR 0.9 01/31/2018   HGBA1C 5.8 01/30/2017   MICROALBUR 2.5 (H) 12/26/2011    CT ABDOMEN PELVIS WO CONTRAST  Result Date: 12/21/2021 CLINICAL DATA:  Unintentional weight loss approximately 12 lb in 1 month. Nausea. Weakness. Constipation. EXAM: CT ABDOMEN AND PELVIS WITHOUT CONTRAST TECHNIQUE: Multidetector CT imaging of the abdomen and pelvis was performed following the standard protocol without IV contrast. RADIATION DOSE REDUCTION: This exam was performed according to the departmental dose-optimization program which includes automated exposure control, adjustment of the mA and/or kV according to patient size and/or use of iterative reconstruction technique. COMPARISON:  03/29/2017 FINDINGS:  Lower chest: No acute findings. Hepatobiliary: No mass visualized on this unenhanced exam. Gallbladder is unremarkable. No evidence of biliary ductal dilatation. Pancreas: No mass or inflammatory process visualized on this unenhanced exam. Spleen:  Within normal limits in size. Adrenals/Urinary tract: Vascular calcification is again noted in the left renal hilum. No evidence of urolithiasis or hydronephrosis. Unremarkable unopacified urinary bladder. Stomach/Bowel: A large hiatal hernia is again seen. No evidence of obstruction, inflammatory process, or abnormal fluid collections. Colonic diverticulosis is seen which is most severe in the descending and sigmoid colon, however there is no evidence of diverticulitis. Vascular/Lymphatic: No pathologically enlarged lymph nodes identified. No evidence of abdominal aortic aneurysm. Aortic atherosclerotic calcification noted. Reproductive: Prior hysterectomy noted. Adnexal regions are unremarkable in appearance. Other:  None. Musculoskeletal:  No suspicious bone lesions identified. IMPRESSION: No acute findings. Stable  large hiatal hernia. Colonic diverticulosis, without radiographic evidence of diverticulitis. Electronically Signed   By: Marlaine Hind M.D.   On: 12/21/2021 15:28   CT HEAD WO CONTRAST (5MM)  Result Date: 12/21/2021 CLINICAL DATA:  Dizziness, weakness, nausea, fatigue, lightheadedness EXAM: CT HEAD WITHOUT CONTRAST TECHNIQUE: Contiguous axial images were obtained from the base of the skull through the vertex without intravenous contrast. RADIATION DOSE REDUCTION: This exam was performed according to the departmental dose-optimization program which includes automated exposure control, adjustment of the mA and/or kV according to patient size and/or use of iterative reconstruction technique. COMPARISON:  None FINDINGS: Brain: Generalized atrophy. Normal ventricular morphology. No midline shift or mass effect. Small vessel chronic ischemic changes of deep  cerebral white matter. No intracranial hemorrhage, mass lesion, or evidence of acute infarction. No extra-axial fluid collections. Few nonspecific calcifications are seen at the LEFT hemisphere, nonspecific. Vascular: Atherosclerotic calcification of internal carotid and vertebral arteries at skull base. No hyperdense vessels. Skull: Intact Sinuses/Orbits: Clear Other: N/A IMPRESSION: Atrophy with small vessel chronic ischemic changes of deep cerebral white matter. No acute intracranial abnormalities. Electronically Signed   By: Lavonia Dana M.D.   On: 12/21/2021 15:27    Assessment & Plan:   Problem List Items Addressed This Visit   None   I spent a total of   minutes with this patient in a face to face visit on the date of this encounter reviewing the last office visit with me on        ,  most recent with patient's cardiologist in    ,  patient'ss diet and eating habits, home blood pressure readings ,  most recent imaging study ,   and post visit ordering of testing and therapeutics.    Follow-up: No follow-ups on file.   [name], MD

## 2022-02-07 NOTE — Patient Instructions (Signed)
Continue the mirtazapine at bedtime.  I have increased the dose to 15 mg to stimulate your appetite  Pleas drink a protein shake every day to BOOST your protein and calories

## 2022-02-08 NOTE — Assessment & Plan Note (Signed)
Reviewed findings of prior CT scan today..  Patient is tolerating high potency statin therapy  

## 2022-02-08 NOTE — Assessment & Plan Note (Signed)
Improved with change to remeron.  Increasing dose today to stimulate appetite

## 2022-02-08 NOTE — Assessment & Plan Note (Signed)
Increasing remeron dose to 15 mg for appetite.  Reviewed diet,  Advised daughter to furnish protein drinks (Ensure, Boost and glucerna)

## 2022-02-10 ENCOUNTER — Ambulatory Visit (INDEPENDENT_AMBULATORY_CARE_PROVIDER_SITE_OTHER): Payer: Medicare Other

## 2022-02-10 VITALS — Ht 60.0 in | Wt 115.0 lb

## 2022-02-10 DIAGNOSIS — Z0000 Encounter for general adult medical examination without abnormal findings: Secondary | ICD-10-CM | POA: Diagnosis not present

## 2022-02-10 NOTE — Progress Notes (Signed)
Subjective:   [name] is a 88 y.o. female who presents for Medicare Annual (Subsequent) preventive examination.  Review of Systems    No ROS.  Medicare Wellness Virtual Visit.  Visual/audio telehealth visit, UTA vital signs.   See social history for additional risk factors.   Cardiac Risk Factors include: advanced age (>57mn, >>92women)     Objective:    Today's Vitals   02/10/22 1350  Weight: 115 lb (52.2 kg)  Height: 5' (1.524 m)   Body mass index is 22.46 kg/m.     02/10/2022    1:56 PM 11/29/2021   12:03 PM 11/18/2021    5:37 PM 04/01/2021    3:57 PM 03/15/2021    1:17 PM 02/17/2021    4:07 PM 01/20/2021   12:51 PM  Advanced Directives  Does Patient Have a Medical Advance Directive? Yes Yes Yes Yes Yes Yes Yes  Type of AParamedicof [place]ank[place]Living will HSanta VenetiaLiving will [place]umber[place]Living will HHide-A-Way HillsLiving will HPetersLiving will HBlue Ridge SummitLiving will Living will;Healthcare Power of Attorney  Does patient want to make changes to medical advance directive? No - Patient declined No - Patient declined No - Patient declined  No - Patient declined No - Patient declined No - Patient declined  Copy of HGold[place] Gatein Chart? No - copy requested  No - copy requested  No - copy requested No - copy requested     Current Medications (verified) Outpatient Encounter Medications as of 02/10/2022  Medication Sig   atorvastatin (LIPITOR) 40 MG tablet TAKE 1 TABLET BY MOUTH DAILY   Bacillus Coagulans-Inulin (PROBIOTIC) 1-250 BILLION-MG CAPS Take 1 capsule by mouth daily.   clopidogrel (PLAVIX) 75 MG tablet TAKE 1 TABLET BY MOUTH DAILY   Cranberry 125 MG TABS Take 1 tablet by mouth daily.   esomeprazole (NEXIUM) 40 MG capsule Take 1 capsule (40 mg total) by mouth in the morning.   furosemide (LASIX) 20 MG tablet TAKE 1 TABLET BY MOUTH NOT  MORE THAN ONCE A WEEK AS NEEDED   Ipratropium-Albuterol (COMBIVENT RESPIMAT) 20-100 MCG/ACT AERS respimat INHALE ONE PUFF EVERY SIX HOURS   isosorbide mononitrate (IMDUR) 60 MG 24 hr tablet TAKE 1 AND 1/2 TABLETS BY MOUTH TWO TIMES DAILY   losartan (COZAAR) 100 MG tablet TAKE 1 TABLET BY MOUTH DAILY   magic mouthwash (lidocaine, diphenhydrAMINE, alum & mag hydroxide) suspension Swish and spit 5 mLs 4 (four) times daily as needed for mouth pain.   mirtazapine (REMERON) 15 MG tablet Take 1 tablet (15 mg total) by mouth at bedtime.   nitroGLYCERIN (NITROSTAT) 0.4 MG SL tablet PLACE 1 TABLET UNDER TONGUE EVERY 5 MIN AS NEEDED FOR CHEST PAIN IF NO RELIEF IN15 MIN CALL 911 (MAX 3 TABS)   nystatin (MYCOSTATIN) 100000 UNIT/ML suspension Take 5 mLs (500,000 Units total) by mouth 4 (four) times daily.   ondansetron (ZOFRAN) 4 MG tablet Take 1 tablet (4 mg total) by mouth every 8 (eight) hours as needed for nausea or vomiting.   Probiotic Product (PROBIOTIC PO) Take 1 capsule by mouth daily.   propranolol (INDERAL) 20 MG tablet TAKE ONE TABLET 3 TIMES DAILY AS NEEDED FOR TACHYCARDIA   ranolazine (RANEXA) 500 MG 12 hr tablet TAKE 1 TABLET BY MOUTH TWICE DAILY   vitamin B-12 (CYANOCOBALAMIN) 500 MCG tablet Take 500 mcg by mouth daily.   No facility-administered encounter medications on file as of  02/10/2022.    Allergies (verified) Patient has no known allergies.   History: Past Medical History:  Diagnosis Date   C. difficile colitis    CAD (coronary artery disease)    a. s/p CABG 1999; b. Nuclear 10/11, no scar or ischemia, EF 68%; b. [place] 06/18/14: no evidence of infarction or ischemia, EF 77%, low risk study; c. 02/2019 MV: EF 56%, ? mild lat ischemia->low risk.   Carotid artery disease ([place])    a. Doppler January, 2012, 40-59% bilateral; b. 02/2017 U/S: 1-39% bilat ICA stenosis.   Closed fracture pubis ([place]) 07/04/2017   COPD (chronic obstructive pulmonary disease) (HCC)    Diffuse  cystic mastopathy    Diverticulitis    last colonoscopy  incomplete Jan 2011   Dizziness    stable now (Oct 2011) patient tells me question of TIA, we will obtain records   Gait abnormality    Patient complains of "wobbly gait", December, 2013   GERD (gastroesophageal reflux disease)    History of migraines    Hx of CABG    a. 3 vessel in 1999   Hyperlipidemia    Hypertension    Indigestion    3 weeks, October 2011   Kidney stones    Rheumatic fever    SCC (squamous cell carcinoma), face 08/01/2016   Sinus bradycardia    Mild, December, 2013   SOB (shortness of breath)    Syncopal episodes    after 18 holes of golf and increase hydrochlorothiazide, resolved    Past Surgical History:  Procedure Laterality Date   APPENDECTOMY  1950   BREAST BIOPSY Right 1994   BREAST CYST ASPIRATION     multiple BIL   CATARACT EXTRACTION Right 2009   CATARACT EXTRACTION Left 2011   COLONOSCOPY  4098,1191   Dr. Jamal Collin   CORONARY ARTERY BYPASS GRAFT  1999   esophagus stretched     [place]   due to metrorrhagia   Family History  Problem Relation Age of Onset   Heart disease Mother    Heart disease Father    Cancer Neg Hx    Drug abuse Neg Hx    Breast cancer Neg Hx    Social History   Socioeconomic History   Marital status: Widowed    Spouse name: Not on file   Number of children: 2   Years of education: Not on file   Highest education level: Not on file  Occupational History    Employer: RETIRED  Tobacco Use   Smoking status: Former    Packs/day: 0.50    Years: 40.00    Pack years: 20.00    Types: Cigarettes    Quit date: 09/20/1983    Years since quitting: 38.4   Smokeless tobacco: Never  Vaping Use   Vaping Use: Never used  Substance and Sexual Activity   Alcohol use: Not Currently    Comment: yes, social wine   Drug use: No   Sexual activity: Never  Other Topics Concern   Not on file  Social History Narrative    She is widowed,  Lives independently , ambulates independently at baseline   Social Determinants of Health   Financial Resource Strain: Not on file  Food Insecurity: No Food Insecurity   Worried About Charity fundraiser in the Last Year: Never true   [place] in the Last Year: Never true  Transportation Needs: No Transportation Needs  Lack of Transportation (Medical): No   Lack of Transportation (Non-Medical): No  Physical Activity: Not on file  Stress: Not on file  Social Connections: Not on file    Tobacco Counseling Counseling given: Not Answered   Clinical Intake:  Pre-visit preparation completed: Yes        Diabetes: No  How often do you need to have someone help you when you read instructions, pamphlets, or other written materials from your doctor or pharmacy?: 1 - Never  Interpreter Needed?: No      Activities of Daily Living    02/10/2022    1:54 PM  In your present state of health, do you have any difficulty performing the following activities:  Hearing? 1  Vision? 0  Difficulty concentrating or making decisions? 0  Walking or climbing stairs? 0  Comment Cane as needed  Dressing or bathing? 0  Doing errands, shopping? 0  Preparing Food and eating ? N  Using the Toilet? N  In the past six months, have you accidently leaked urine? N  Do you have problems with loss of bowel control? N  Managing your Medications? N  Managing your Finances? N  Housekeeping or managing your Housekeeping? Y  Comment Maid assist    Patient Care Team: [name], MD as PCP - General (Internal Medicine) Minna Merritts, MD as PCP - Cardiology (Cardiology) Minna Merritts, MD (Cardiology) Christene Lye, MD (General Surgery) Karlyn Agee, MD (Unknown Physician Specialty) Leona Singleton, RN as Case Manager  Indicate any recent Medical Services you may have received from other than [facility] providers in the past year (date may be  approximate).     Assessment:   This is a routine wellness examination for Lovie.  Virtual Visit via Telephone Note  I connected with  [name] on 02/10/22 at  1:45 PM EDT by telephone and verified that I am speaking with the correct person using two identifiers.  Persons participating in the virtual visit: patient/Nurse Health Advisor   I discussed the limitations of performing an evaluation and management service by telehealth. We continued and completed visit with audio only. Some vital signs may be absent or patient reported.   Hearing/Vision screen Hearing Screening - Comments:: Hearing aids Vision Screening - Comments:: Followed by [facility]  Wears corrective lenses  Cataract extraction, bilateral  They have seen their ophthalmologist in the last 12 months.   Dietary issues and exercise activities discussed: Current Exercise Habits: Home exercise routine, Type of exercise: walking (Post physical therapy exercises), Time (Minutes): 10, Frequency (Times/Week): 7, Weekly Exercise (Minutes/Week): 70, Intensity: Mild Healthy diet Ensure daily Good water intake   Goals Addressed             This Visit's Progress    Follow up with Primary Care Provider       As needed.       Depression Screen    02/10/2022    1:58 PM 02/07/2022    2:11 PM 12/08/2021    1:39 PM 11/22/2021    2:07 PM 10/11/2021    3:06 PM 07/07/2021    2:14 PM 05/17/2021    3:43 PM  PHQ 2/9 Scores  PHQ - 2 Score 0 0 1 0 0 0 0    Fall Risk    02/10/2022    1:58 PM 02/07/2022    2:11 PM 01/10/2022   10:40 AM 12/08/2021    1:39 PM 11/22/2021    2:06 PM  Fall Risk   Falls in the past year? 0 0 0 0 0  Number falls in past yr: 0      Risk for fall due to :  No Fall Risks No Fall Risks No Fall Risks No Fall Risks  Follow up Falls evaluation completed Falls evaluation completed Falls evaluation completed Falls evaluation completed Falls evaluation completed    [place]: Home free of loose throw rugs in walkways, pet beds, electrical cords, etc? Yes  Adequate lighting in your home to reduce risk of falls? Yes   ASSISTIVE DEVICES UTILIZED TO PREVENT FALLS: Use of a cane, walker or w/c? Yes , as needed   TIMED UP AND GO: Was the test performed? No .   Cognitive Function: Patient is alert and oriented x3.     03/25/2020    9:26 AM 11/08/2016   11:34 AM  MMSE - Mini Mental State Exam  Not completed: Unable to complete   Orientation to time  5  Orientation to Place  5  Registration  3  Attention/ Calculation  5  Recall  3  Language- name 2 objects  2  Language- repeat  1  Language- follow 3 step command  3  Language- read & follow direction  1  Write a sentence  1  Copy design  1  Total score  30        03/25/2019    9:21 AM 03/14/2018    9:56 AM  6CIT Screen  What Year? 0 points 0 points  What month? 0 points 0 points  What time? 0 points 0 points  Count back from 20 0 points 0 points  Months in reverse 0 points 0 points  Repeat phrase 0 points 0 points  Total Score 0 points 0 points    Immunizations Immunization History  Administered Date(s) Administered   Fluad Quad(high Dose 65+) 05/15/2019, 06/11/2020, 05/17/2021   Influenza Split 07/02/2014, 07/08/2015, 05/20/2016   Influenza Whole 06/11/2012   Influenza, High Dose Seasonal PF 05/30/2018   Moderna Sars-Covid-2 Vaccination 12/25/2020   PFIZER Comirnaty(Gray Top)Covid-19 Tri-Sucrose Vaccine 07/20/2020   PFIZER(Purple Top)SARS-COV-2 Vaccination 09/23/2019, 10/14/2019, 07/20/2020   Pneumococcal Conjugate-13 12/24/2013   Pneumococcal Polysaccharide-23 09/16/2011, 11/09/2015   Tdap 02/07/2013   Zoster Recombinat (Shingrix) 05/15/2017, 07/21/2017   Zoster, Live 07/26/2013   Screening Tests Health Maintenance  Topic Date Due   INFLUENZA VACCINE  04/19/2022   TETANUS/TDAP  02/08/2023   Pneumonia Vaccine 45+ Years old  Completed   DEXA SCAN  Completed    COVID-19 Vaccine  Completed   Zoster Vaccines- Shingrix  Completed   HPV VACCINES  Aged Out   Health Maintenance There are no preventive care reminders to display for this patient.  Lung Cancer Screening: (Low Dose CT Chest recommended if Age 60-80 years, 30 pack-year currently smoking OR have quit w/in 15years.) does not qualify.   Hepatitis C Screening: does not qualify  Vision Screening: Recommended annual ophthalmology exams for early detection of glaucoma and other disorders of the eye.  Dental Screening: Recommended annual dental exams for proper oral hygiene  Community Resource Referral / Chronic Care Management: CRR required this visit?  No   CCM required this visit?  No      Plan:   Keep all routine maintenance appointments.   I have personally reviewed and noted the following in the patient's chart:   Medical and social history Use of alcohol, tobacco or illicit drugs  Current medications  and supplements including opioid prescriptions.  Functional ability and status Nutritional status Physical activity Advanced directives List of other physicians Hospitalizations, surgeries, and ER visits in previous 12 months Vitals Screenings to include cognitive, depression, and falls Referrals and appointments  In addition, I have reviewed and discussed with patient certain preventive protocols, quality metrics, and best practice recommendations. A written personalized care plan for preventive services as well as general preventive health recommendations were provided to patient.     Varney Biles, LPN   2/44/6286

## 2022-02-10 NOTE — Patient Instructions (Addendum)
  [name] , Thank you for taking time to come for your Medicare Wellness Visit. I appreciate your ongoing commitment to your health goals. Please review the following plan we discussed and let me know if I can assist you in the future.   These are the goals we discussed:  Goals      Follow up with Primary Care Provider     As needed.        This is a list of the screening recommended for you and due dates:  Health Maintenance  Topic Date Due   Flu Shot  04/19/2022   Tetanus Vaccine  02/08/2023   Pneumonia Vaccine  Completed   DEXA scan (bone density measurement)  Completed   COVID-19 Vaccine  Completed   Zoster (Shingles) Vaccine  Completed   HPV Vaccine  Aged Out

## 2022-02-16 DIAGNOSIS — Z87891 Personal history of nicotine dependence: Secondary | ICD-10-CM | POA: Diagnosis not present

## 2022-02-16 DIAGNOSIS — I10 Essential (primary) hypertension: Secondary | ICD-10-CM

## 2022-02-16 DIAGNOSIS — J449 Chronic obstructive pulmonary disease, unspecified: Secondary | ICD-10-CM

## 2022-03-04 ENCOUNTER — Ambulatory Visit (INDEPENDENT_AMBULATORY_CARE_PROVIDER_SITE_OTHER): Payer: Medicare Other | Admitting: *Deleted

## 2022-03-04 DIAGNOSIS — I10 Essential (primary) hypertension: Secondary | ICD-10-CM

## 2022-03-04 DIAGNOSIS — J449 Chronic obstructive pulmonary disease, unspecified: Secondary | ICD-10-CM

## 2022-03-04 NOTE — Patient Instructions (Addendum)
Visit Information  Thank you for taking time to visit with me today. Please don't hesitate to contact me if I can be of assistance to you before our next scheduled telephone appointment.  Following are the goals we discussed today:  Identify and avoid  triggers Identify and remove indoor air pollutants Limit outdoor activity during cold weather  Rinse mouth after inhaler use Continue dry mouth treatment with sips of water, hard candy, chewing gum check blood pressure daily Write blood pressure results in a log  Take log to medical appointments for provider review Low salt diet Fall precautions and preventions Drink plenty of fluids Do exercises taught by therapist  Our next appointment is by telephone on 7/21 at 1000  Please call the care guide team at [phone] if you need to cancel or reschedule your appointment.   If you are experiencing a Mental Health or [place] or need someone to talk to, please call the Suicide and Crisis Lifeline: 988 call the [place] National Suicide Prevention Lifeline: (250)764-7068 or TTY: 412-490-6978 TTY 304-543-3016) to talk to a trained counselor call 1-800-273-TALK (toll free, 24 hour hotline) go to [facility] [address], [place] [phone]) call 911   Patient verbalizes understanding of instructions and care plan provided today and agrees to view in [facility]. Active MyChart status and patient understanding of how to access instructions and care plan via MyChart confirmed with patient.      Hubert Azure RN, MSN RN Care Management Coordinator [place] [phone] [email or web address]

## 2022-03-04 NOTE — Chronic Care Management (AMB) (Cosign Needed)
Chronic Care Management   CCM RN Visit Note  03/04/2022 Name: [name] MRN: [id number] DOB: 03/28/1926  Subjective: [name] is a 88 y.o. year old female who is a primary care patient of Tullo, Aris Everts, MD. The care management team was consulted for assistance with disease management and care coordination needs.    Engaged with patient by telephone for follow up visit in response to provider referral for case management and/or care coordination services.   Consent to Services:  The patient was given information about Chronic Care Management services, agreed to services, and gave verbal consent prior to initiation of services.  Please see initial visit note for detailed documentation.   Patient agreed to services and verbal consent obtained.   Assessment: Review of patient past medical history, allergies, medications, health status, including review of consultants reports, laboratory and other test data, was performed as part of comprehensive evaluation and provision of chronic care management services.   SDOH (Social Determinants of Health) assessments and interventions performed:    CCM Care Plan  No Known Allergies  Outpatient Encounter Medications as of 03/04/2022  Medication Sig Note   atorvastatin (LIPITOR) 40 MG tablet TAKE 1 TABLET BY MOUTH DAILY    Bacillus Coagulans-Inulin (PROBIOTIC) 1-250 BILLION-MG CAPS Take 1 capsule by mouth daily.    clopidogrel (PLAVIX) 75 MG tablet TAKE 1 TABLET BY MOUTH DAILY    Cranberry 125 MG TABS Take 1 tablet by mouth daily.    esomeprazole (NEXIUM) 40 MG capsule Take 1 capsule (40 mg total) by mouth in the morning.    furosemide (LASIX) 20 MG tablet TAKE 1 TABLET BY MOUTH NOT MORE THAN ONCE A WEEK AS NEEDED    Ipratropium-Albuterol (COMBIVENT RESPIMAT) 20-100 MCG/ACT AERS respimat INHALE ONE PUFF EVERY SIX HOURS    isosorbide mononitrate (IMDUR) 60 MG 24 hr tablet TAKE 1 AND 1/2 TABLETS BY MOUTH TWO TIMES DAILY    losartan  (COZAAR) 100 MG tablet TAKE 1 TABLET BY MOUTH DAILY    magic mouthwash (lidocaine, diphenhydrAMINE, alum & mag hydroxide) suspension Swish and spit 5 mLs 4 (four) times daily as needed for mouth pain.    mirtazapine (REMERON) 15 MG tablet Take 1 tablet (15 mg total) by mouth at bedtime.    nitroGLYCERIN (NITROSTAT) 0.4 MG SL tablet PLACE 1 TABLET UNDER TONGUE EVERY 5 MIN AS NEEDED FOR CHEST PAIN IF NO RELIEF IN15 MIN CALL 911 (MAX 3 TABS)    nystatin (MYCOSTATIN) 100000 UNIT/ML suspension Take 5 mLs (500,000 Units total) by mouth 4 (four) times daily.    ondansetron (ZOFRAN) 4 MG tablet Take 1 tablet (4 mg total) by mouth every 8 (eight) hours as needed for nausea or vomiting.    Probiotic Product (PROBIOTIC PO) Take 1 capsule by mouth daily.    propranolol (INDERAL) 20 MG tablet TAKE ONE TABLET 3 TIMES DAILY AS NEEDED FOR TACHYCARDIA 03/15/2021: Hasn't needed in long time per patient   ranolazine (RANEXA) 500 MG 12 hr tablet TAKE 1 TABLET BY MOUTH TWICE DAILY    vitamin B-12 (CYANOCOBALAMIN) 500 MCG tablet Take 500 mcg by mouth daily.    No facility-administered encounter medications on file as of 03/04/2022.    Patient Active Problem List   Diagnosis Date Noted   Venous insufficiency of both lower extremities 01/10/2022   Hiatal hernia 01/03/2022   Diverticulosis 01/03/2022   Aortic atherosclerosis ([place]) 01/03/2022   Generalized muscle weakness 12/09/2021   Arrhythmia 12/08/2021   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  Oral thrush 11/22/2021   Mild cognitive impairment with memory loss 10/12/2021   Hyperparathyroidism due to renal insufficiency ([place]) 02/02/2021   CKD (chronic kidney disease) stage 3, GFR 30-59 ml/min (HCC) 01/15/2021   Grief 04/08/2020   History of esophageal stricture 04/08/2020   Leg swelling 03/09/2020   Atypical chest pain 01/10/2020   B12 deficiency 01/09/2020   Generalized weakness 07/14/2019   Mild malnutrition ([place]) 56/70/1410   Diastolic CHF, chronic ([place])  03/25/2019   Vitamin D deficiency 03/25/2019   S/P excision of skin lesion, follow-up exam 12/26/2017   Impingement syndrome of left shoulder region 07/04/2017   Allergic rhinitis 12/31/2016   Osteoporosis of forearm 02/13/2016   Insomnia secondary to anxiety 07/12/2015   Anxiety disorder 07/12/2015   Essential (primary) hypertension 10/07/2014   Sinus bradycardia    Hardening of the aorta (main artery of the heart) ([place]) 06/26/2014   History of Clostridium difficile colitis 10/20/2013   History of pancreatitis 10/20/2013   Low back pain 10/04/2013   Do not resuscitate discussion 02/07/2013   Chest pain at rest 12/26/2011   COPD, moderate ([place]) 12/05/2011   Presence of aortocoronary bypass graft    Stage 3b chronic kidney disease ([place]) 09/25/2011   Hyperlipidemia    SOB (shortness of breath)    Coronary artery disease of native artery of native heart with stable angina pectoris ([place])    Carotid artery disease ([place])     Conditions to be addressed/monitored:HTN and COPD  Care Plan : COPD (Adult)  Updates made by Leona Singleton, RN since 03/04/2022 12:00 AM     Problem: Symptom Exacerbation (COPD/HTN)   Priority: Medium     Long-Range Goal: Symptom Exacerbation Prevented or Minimized  (COPD/HTN)   Start Date: 02/02/2022  Expected End Date: 02/03/2023  Priority: Medium  Note:   Current Barriers:  Knowledge deficits related to basic understanding of COPD disease process Knowledge deficits related to basic COPD self care/management history of COPD; currently recovering from uti.  Completed antibiotics today.  Still has the oral thrush, encouraged to continue treatment provided by provider and to add yogurt to her diet, contact provider if mouth does not get better.  Reports she continues to be real weak  confirmed she has assistance at home.  Reports blood pressures have ranged 160/70'S.  Denies any falls or sob.  Discussed drinking more fluids to increase hydration 3/17--recent  ed visit for continued weakness.  Denies fall or sob.  Now reporting no one stays overnight with her, but if need be her daughter can.  Encouraged to discuss with daughter.  Continues to complain of mouth burning and sore even with the various mouth washes.  Reports trying to stay hydrated but continues to be very weak, hard to care for self.  Rncm assisted patient to call pcp office for f/u appointment  to discuss hh vs op therapy 4/10--States she feels some better, still with periods of nausea and still very weak.  At home alone and can independently get to bathroom and bed.  Toleratig food and drink in small quantities.  States her daughter has spoken to someone about therapy.  Denies any increase in SOB, edema, chest pains or falls.  Does endorse some lightheadiness and dizziness with BP readings of 120-150/60-90's.  Fall precautions and preventions reviewed and discussed; encouraged to stay hydrated 5/17--reports to continue to get better; states she finished working with therapy this week.  Says she is still a little weak but feels like therapy did make  her stronger.  Denies any recent falls.  Denies any increase SOB.  States blood pressure have been elevated but does not have readings; feels home blood pressures are higher than at doctors office; feels dry mouth is just a little better 6/16--reports she is feeling better, little stronger.  Now sleeping upstairs in her own bed.  Denies any recent falls.  Denies any chest pain, dizziness, or SOB.  Reports using rescue inhaler once in the last month.  States blood pressures continue to be elevated but does not have numbers to review. Case Manager Clinical Goal(s): patient will report using inhalers as prescribed including rinsing mouth after use patient will verbalize understanding of COPD action plan and when to seek appropriate levels of medical care patient will verbalize basic understanding of COPD disease process and self care activities   COPD:  (Status: Goal on Track (progressing): YES.) Long Term Goal  Interventions:  Collaboration with [name], MD regarding development and update of comprehensive plan of care as evidenced by provider attestation and co-signature Inter-disciplinary care team collaboration (see longitudinal plan of care) Provided patient with basic written and verbal COPD education on self care/management/and exacerbation prevention  Provided patient with COPD action plan and reinforced importance of daily self assessment Provided instruction about proper use of medications used for management of COPD including inhalers Advised patient to self assesses COPD action plan zone and make appointment with provider if in the yellow zone for 48 hours without improvement. Provided education about and advised patient to utilize infection prevention strategies to reduce risk of respiratory infection  Healthy lifestyle promoted and encouraged to develop a rescue plan Rescue (action) plan reviewed and signs/symptoms of worsening disease assessed; encouraged to follow rescue plan if symptoms flare-up Discussed eliminating symptom triggers at home Encouraged to attend all scheduled provider appointments   Instructed to call provider office for new concerns or questions Reviewed and assessed barriers and manage adherence, including inhaler technique and persistent trigger exposure; encourage adherence, even when symptoms are controlled or infrequent, Encouraged to monitor for signs/symptoms of psychosocial concerns, such as shortness of breath-anxiety cycle or depression that may impact stability of symptoms FALL PRECAUTIONS AND PREVENTIONS REVIEWED AND DISCUSSED  Hypertension: (Status: Goal on track: NO.) Long Term Goal  Last practice recorded BP readings:  BP Readings from Last 3 Encounters:  01/10/22 (!) 158/76  12/21/21 (!) 160/52  12/08/21 132/64  Most recent eGFR/CrCl: No results found for: EGFR  No components found  for: CRCL  Evaluation of current treatment plan related to hypertension self management and patient's adherence to plan as established by provider;   Provided education to patient re: stroke prevention, s/s of heart attack and stroke; Reviewed prescribed diet low salt Reviewed medications with patient and discussed importance of compliance;  Discussed plans with patient for ongoing care management follow up and provided patient with direct contact information for care management team; Advised patient, providing education and rationale, to monitor blood pressure daily and record, calling PCP for findings outside established parameters;  Discussed complications of poorly controlled blood pressure such as heart disease, stroke, circulatory complications, vision complications, kidney impairment, sexual dysfunction;   Discussed importance of monitoring blood pressure daily and notifying provider for sustained elevations or hypotensive episodes Encouraged to take home BP machine to next provider appointment and let staff assess machine and compare readings  Patient Goals/Self-Care Activities: : Identify and avoid  triggers Identify and remove indoor air pollutants Limit outdoor activity during cold weather  Rinse mouth  after inhaler use Continue dry mouth treatment with sips of water, hard candy, chewing gum check blood pressure daily Write blood pressure results in a log  Take log to medical appointments for provider review Low salt diet Fall precautions and preventions Drink plenty of fluids Do exercises taught by therapist Follow Up Plan: The care management team will reach out to the patient again over the next 45 business days.       Plan:The care management team will reach out to the patient again over the next 45 days.  Hubert Azure RN, MSN RN Care Management Coordinator [place] [phone] [email or web address]

## 2022-03-18 DIAGNOSIS — I10 Essential (primary) hypertension: Secondary | ICD-10-CM | POA: Diagnosis not present

## 2022-03-18 DIAGNOSIS — J449 Chronic obstructive pulmonary disease, unspecified: Secondary | ICD-10-CM | POA: Diagnosis not present

## 2022-04-08 ENCOUNTER — Ambulatory Visit (INDEPENDENT_AMBULATORY_CARE_PROVIDER_SITE_OTHER): Payer: Medicare Other | Admitting: *Deleted

## 2022-04-08 DIAGNOSIS — I10 Essential (primary) hypertension: Secondary | ICD-10-CM

## 2022-04-08 DIAGNOSIS — J449 Chronic obstructive pulmonary disease, unspecified: Secondary | ICD-10-CM

## 2022-04-08 NOTE — Chronic Care Management (AMB) (Signed)
  Care Management   Follow Up Note   04/08/2022 Name: [name] MRN: [id number] DOB: 1926/03/23   Referred by: [name], MD Reason for referral : Case Closure   Successful outreach to patient.  States she is doing well without complaints.  Discussed goals and both agree patient has met goals of the program.  Follow Up Plan: The patient has been provided with contact information for the care management team and has been advised to call with any health-related questions or concerns.  No further follow up required: as personal goals have been met.  Hubert Azure RN, MSN RN Care Management Coordinator [place] [phone] [email or web address]

## 2022-04-08 NOTE — Patient Instructions (Signed)
CONGRATULATIONS ON COMPLETING YOUR GOALS.  IT AS BEEN A PLEASURE WORKING WITH AND TALKING TO YOU.  IF  NEEDS [place] IN THE FUTURE PLEASE DO NOT HESITATE TO CONTACT ME  [phone]  Catelyn Friel RN, MSN RN Care Management Coordinator [facility] [phone] [email or web address]  

## 2022-04-11 ENCOUNTER — Other Ambulatory Visit: Payer: Self-pay | Admitting: Internal Medicine

## 2022-04-13 DIAGNOSIS — D485 Neoplasm of uncertain behavior of skin: Secondary | ICD-10-CM | POA: Diagnosis not present

## 2022-04-13 DIAGNOSIS — D0472 Carcinoma in situ of skin of left lower limb, including hip: Secondary | ICD-10-CM | POA: Diagnosis not present

## 2022-04-13 DIAGNOSIS — D0462 Carcinoma in situ of skin of left upper limb, including shoulder: Secondary | ICD-10-CM | POA: Diagnosis not present

## 2022-04-13 DIAGNOSIS — L821 Other seborrheic keratosis: Secondary | ICD-10-CM | POA: Diagnosis not present

## 2022-04-18 DIAGNOSIS — J449 Chronic obstructive pulmonary disease, unspecified: Secondary | ICD-10-CM | POA: Diagnosis not present

## 2022-04-18 DIAGNOSIS — H353221 Exudative age-related macular degeneration, left eye, with active choroidal neovascularization: Secondary | ICD-10-CM | POA: Diagnosis not present

## 2022-04-18 DIAGNOSIS — I10 Essential (primary) hypertension: Secondary | ICD-10-CM

## 2022-05-10 ENCOUNTER — Other Ambulatory Visit: Payer: Self-pay | Admitting: Cardiovascular Disease

## 2022-05-10 ENCOUNTER — Other Ambulatory Visit: Payer: Self-pay | Admitting: Internal Medicine

## 2022-05-10 ENCOUNTER — Ambulatory Visit: Payer: Medicare Other | Admitting: Internal Medicine

## 2022-05-14 ENCOUNTER — Emergency Department: Payer: Medicare Other

## 2022-05-14 ENCOUNTER — Other Ambulatory Visit: Payer: Self-pay

## 2022-05-14 ENCOUNTER — Emergency Department
Admission: EM | Admit: 2022-05-14 | Discharge: 2022-05-15 | Discharge status: Against Medical Advice | Payer: Medicare Other | Attending: Emergency Medicine | Admitting: Emergency Medicine

## 2022-05-14 DIAGNOSIS — Z5321 Procedure and treatment not carried out due to patient leaving prior to being seen by health care provider: Secondary | ICD-10-CM | POA: Diagnosis not present

## 2022-05-14 DIAGNOSIS — R0789 Other chest pain: Secondary | ICD-10-CM | POA: Diagnosis not present

## 2022-05-14 DIAGNOSIS — Z951 Presence of aortocoronary bypass graft: Secondary | ICD-10-CM | POA: Diagnosis not present

## 2022-05-14 DIAGNOSIS — R079 Chest pain, unspecified: Secondary | ICD-10-CM | POA: Diagnosis not present

## 2022-05-14 DIAGNOSIS — I10 Essential (primary) hypertension: Secondary | ICD-10-CM | POA: Diagnosis not present

## 2022-05-14 LAB — COMPREHENSIVE METABOLIC PANEL
ALT: 13 U/L (ref 0–44)
AST: 19 U/L (ref 15–41)
Albumin: 3.8 g/dL (ref 3.5–5.0)
Alkaline Phosphatase: 67 U/L (ref 38–126)
Anion gap: 7 (ref 5–15)
BUN: 24 mg/dL — ABNORMAL HIGH (ref 8–23)
CO2: 24 mmol/L (ref 22–32)
Calcium: 8.6 mg/dL — ABNORMAL LOW (ref 8.9–10.3)
Chloride: 105 mmol/L (ref 98–111)
Creatinine, Ser: 1.31 mg/dL — ABNORMAL HIGH (ref 0.44–1.00)
GFR, Estimated: 38 mL/min — ABNORMAL LOW (ref >60)
Glucose, Bld: 112 mg/dL — ABNORMAL HIGH (ref 70–99)
Potassium: 4.4 mmol/L (ref 3.5–5.1)
Sodium: 136 mmol/L (ref 135–145)
Total Bilirubin: 0.9 mg/dL (ref 0.3–1.2)
Total Protein: 6.3 g/dL — ABNORMAL LOW (ref 6.5–8.1)

## 2022-05-14 LAB — CBC
HCT: 37.9 % (ref 36.0–46.0)
Hemoglobin: 12.5 g/dL (ref 12.0–15.0)
MCH: 33.5 pg (ref 26.0–34.0)
MCHC: 33 g/dL (ref 30.0–36.0)
MCV: 101.6 fL — ABNORMAL HIGH (ref 80.0–100.0)
Platelets: 186 10*3/uL (ref 150–400)
RBC: 3.73 MIL/uL — ABNORMAL LOW (ref 3.87–5.11)
RDW: 12.1 % (ref 11.5–15.5)
WBC: 6.7 10*3/uL (ref 4.0–10.5)
nRBC: 0 % (ref 0.0–0.2)

## 2022-05-14 LAB — TROPONIN I (HIGH SENSITIVITY): Troponin I (High Sensitivity): 14 ng/L (ref <18)

## 2022-05-14 LAB — LIPASE, BLOOD: Lipase: 52 U/L — ABNORMAL HIGH (ref 11–51)

## 2022-05-14 NOTE — ED Notes (Signed)
Pt and family member to stat desk stating they are leaving.  Will return if sx return.

## 2022-05-14 NOTE — ED Triage Notes (Addendum)
Pt presents to ER via ems from home with c/o central chest pain that is non-radiating in nature.  Pt states pain feels like a pressure in nature.  Pt endroses previous hx of CABG surgery.  Pt denies n/v, or dizziness with this pain.  Does state she has been sob.  Pt is A&O x4 at this time in NAD in triage.    Pt took some NTG at home which helped relieve the pain slightly, along with 324 mg ASA.

## 2022-05-24 ENCOUNTER — Encounter: Payer: Self-pay | Admitting: Internal Medicine

## 2022-05-24 ENCOUNTER — Ambulatory Visit (INDEPENDENT_AMBULATORY_CARE_PROVIDER_SITE_OTHER): Payer: Medicare Other | Admitting: Internal Medicine

## 2022-05-24 DIAGNOSIS — E441 Mild protein-calorie malnutrition: Secondary | ICD-10-CM

## 2022-05-24 DIAGNOSIS — I872 Venous insufficiency (chronic) (peripheral): Secondary | ICD-10-CM | POA: Diagnosis not present

## 2022-05-24 DIAGNOSIS — R0789 Other chest pain: Secondary | ICD-10-CM

## 2022-05-24 DIAGNOSIS — I10 Essential (primary) hypertension: Secondary | ICD-10-CM | POA: Diagnosis not present

## 2022-05-24 DIAGNOSIS — I6523 Occlusion and stenosis of bilateral carotid arteries: Secondary | ICD-10-CM

## 2022-05-24 MED ORDER — PANTOPRAZOLE SODIUM 40 MG PO TBEC: 40.0000 mg | DELAYED_RELEASE_TABLET | Freq: Two times a day (BID) | ORAL | 3 refills | Status: DC

## 2022-05-24 NOTE — Progress Notes (Signed)
Subjective:  Patient ID: [name], female    DOB: 11/25/1925  Age: 88 y.o. MRN: [id number]  CC: Diagnoses of Atypical chest pain, Venous insufficiency of both lower extremities, Essential (primary) hypertension, and Mild malnutrition ([place]) were pertinent to this visit.   HPI [name] presents for ER follow up and follow up on chronic conditions  Chief Complaint  Patient presents with   Follow-up    3 month follow up    1) MILD MALNUTRiTION;  has gained 5 lbs since Remeron  dose has increased 3 months ago and protein intake increase  2) hypertension : not checkig BP at home  . .taking imdur and losartan only.   3)  venous insufficiency.  Wearing compression stockings.  Not taking lasix more than once a week   4)  ER VISIT FOR CHEST PAIN ON AUGUST 23.  THE CHEST PAIN OCCURRED AT 10 PM and resolved after 2 SL NTG.  However it recurred at 2:20 am so her daughter took her to the ER .  Marland Kitchen She waited 5 HOURS, WAS NEVER SEEN ,by MD  BUT EKG , lipase  troponin  AND CHEST  X RAY WERE DONE. SO SHE LEFT   Outpatient Medications Prior to Visit  Medication Sig Dispense Refill   atorvastatin (LIPITOR) 40 MG tablet TAKE 1 TABLET BY MOUTH DAILY 90 tablet 1   Bacillus Coagulans-Inulin (PROBIOTIC) 1-250 BILLION-MG CAPS Take 1 capsule by mouth daily. 30 capsule 0   clopidogrel (PLAVIX) 75 MG tablet TAKE 1 TABLET BY MOUTH DAILY 90 tablet 0   Cranberry 125 MG TABS Take 1 tablet by mouth daily.     furosemide (LASIX) 20 MG tablet TAKE 1 TABLET BY MOUTH NOT MORE THAN ONCE A WEEK AS NEEDED 12 tablet 3   Ipratropium-Albuterol (COMBIVENT RESPIMAT) 20-100 MCG/ACT AERS respimat INHALE ONE PUFF EVERY SIX HOURS 4 g 5   isosorbide mononitrate (IMDUR) 60 MG 24 hr tablet TAKE 1 AND 1/2 TABLETS BY MOUTH TWO TIMES DAILY 270 tablet 0   losartan (COZAAR) 100 MG tablet TAKE 1 TABLET BY MOUTH DAILY 90 tablet 3   magic mouthwash (lidocaine, diphenhydrAMINE, alum & mag hydroxide) suspension Swish and spit  5 mLs 4 (four) times daily as needed for mouth pain. 360 mL 1   mirtazapine (REMERON) 15 MG tablet TAKE 1 TABLET BY MOUTH AT BEDTIME 30 tablet 2   nitroGLYCERIN (NITROSTAT) 0.4 MG SL tablet PLACE 1 TABLET UNDER TONGUE EVERY 5 MIN AS NEEDED FOR CHEST PAIN IF NO RELIEF IN15 MIN CALL 911 (MAX 3 TABS) 25 tablet 3   nystatin (MYCOSTATIN) 100000 UNIT/ML suspension Take 5 mLs (500,000 Units total) by mouth 4 (four) times daily. 60 mL 0   ondansetron (ZOFRAN) 4 MG tablet Take 1 tablet (4 mg total) by mouth every 8 (eight) hours as needed for nausea or vomiting. 40 tablet 3   Probiotic Product (PROBIOTIC PO) Take 1 capsule by mouth daily.     propranolol (INDERAL) 20 MG tablet TAKE ONE TABLET 3 TIMES DAILY AS NEEDED FOR TACHYCARDIA 270 tablet 3   ranolazine (RANEXA) 500 MG 12 hr tablet TAKE 1 TABLET BY MOUTH TWICE DAILY 180 tablet 3   vitamin B-12 (CYANOCOBALAMIN) 500 MCG tablet Take 500 mcg by mouth daily.     esomeprazole (NEXIUM) 40 MG capsule Take 1 capsule (40 mg total) by mouth in the morning. 30 capsule 0   No facility-administered medications prior to visit.    Review of Systems;  Patient  denies headache, fevers, malaise, unintentional weight loss, skin rash, eye pain, sinus congestion and sinus pain, sore throat, dysphagia,  hemoptysis , cough, dyspnea, wheezing, chest pain, palpitations, orthopnea, edema, abdominal pain, nausea, melena, diarrhea, constipation, flank pain, dysuria, hematuria, urinary  Frequency, nocturia, numbness, tingling, seizures,  Focal weakness, Loss of consciousness,  Tremor, insomnia, depression, anxiety, and suicidal ideation.      Objective:  BP (!) 144/68 (BP Location: Left Arm, Patient Position: Sitting, Cuff Size: Normal)   Pulse 84   Temp 98.1 F (36.7 C) (Oral)   Ht 5' (1.524 m)   Wt 120 lb 3.2 oz (54.5 kg)   SpO2 98%   BMI 23.47 kg/m   BP Readings from Last 3 Encounters:  05/24/22 (!) 144/68  05/14/22 (!) 192/89  02/07/22 (!) 150/70    Wt Readings  from Last 3 Encounters:  05/24/22 120 lb 3.2 oz (54.5 kg)  05/14/22 115 lb (52.2 kg)  02/10/22 115 lb (52.2 kg)    General appearance: alert, cooperative and appears stated age Ears: normal TM's and external ear canals both ears Throat: lips, mucosa, and tongue normal; teeth and gums normal Neck: no adenopathy, no carotid bruit, supple, symmetrical, trachea midline and thyroid not enlarged, symmetric, no tenderness/mass/nodules Back: symmetric, no curvature. ROM normal. No CVA tenderness. Lungs: clear to auscultation bilaterally Heart: regular rate and rhythm, S1, S2 normal, no murmur, click, rub or gallop Abdomen: soft, non-tender; bowel sounds normal; no masses,  no organomegaly Pulses: 2+ and symmetric Skin: Skin color, texture, turgor normal. No rashes or lesions Lymph nodes: Cervical, supraclavicular, and axillary nodes normal.  Lab Results  Component Value Date   HGBA1C 5.8 01/30/2017   HGBA1C 5.8 09/26/2016    Lab Results  Component Value Date   CREATININE 1.31 (H) 05/14/2022   CREATININE 1.37 (H) 01/06/2022   CREATININE 1.43 (H) 11/29/2021    Lab Results  Component Value Date   WBC 6.7 05/14/2022   HGB 12.5 05/14/2022   HCT 37.9 05/14/2022   PLT 186 05/14/2022   GLUCOSE 112 (H) 05/14/2022   CHOL 138 01/06/2022   TRIG 93.0 01/06/2022   HDL 59.50 01/06/2022   LDLDIRECT 72.0 11/09/2015   LDLCALC 60 01/06/2022   ALT 13 05/14/2022   AST 19 05/14/2022   NA 136 05/14/2022   K 4.4 05/14/2022   CL 105 05/14/2022   CREATININE 1.31 (H) 05/14/2022   BUN 24 (H) 05/14/2022   CO2 24 05/14/2022   TSH 5.39 01/06/2022   INR 0.9 01/31/2018   HGBA1C 5.8 01/30/2017   MICROALBUR 2.5 (H) 12/26/2011    DG Chest 2 View  Result Date: 05/14/2022 CLINICAL DATA:  Chest pain EXAM: CHEST - 2 VIEW COMPARISON:  11/18/2021 FINDINGS: Cardiac shadow is enlarged but stable. Postsurgical changes are again seen. Lungs are clear bilaterally. No acute bony abnormality is seen. IMPRESSION:  No acute abnormality noted. Electronically Signed   By: Inez Catalina M.D.   On: 05/14/2022 03:12    Assessment & Plan:   Problem List Items Addressed This Visit     Atypical chest pain    Recommend treating reflux more aggressively,  Changing nexium to protonix,  Adding pre dinner dose ,  And advised her to use her wedge pillow       Essential (primary) hypertension    Elevated today on current regimen of Imdur 90 mg daily and losartan 100 mg daily. Amlodipine was stopped due to lower extremity edema.  Home readings requested.  Mild malnutrition (HCC)    Improved weight with gain of 5 lbs over the last 3 months with increased remeron dose and increased protein intake       Venous insufficiency of both lower extremities    She is using compression stockings rather than lasix  Given previous evidence of dehydration       I spent a total of  30 minutes with this patient in a face to face visit on the date of this encounter reviewing the last office visit with me  in June,  most recent with patient's cardiologist ,  her diet and eating habits, recent ER visit,  including   most recent imaging study , EKG and labs  and post visit ordering of testing and therapeutics.    Follow-up: Return in about 3 months (around 08/23/2022).   [name], MD

## 2022-05-24 NOTE — Patient Instructions (Addendum)
You can take the furosemide once a week for fluid NOT MORE THAN THAT.  The refill is on file   I WANT YOU TO CHECK BLOOD PRESSURE ONCE A DAY AND SEND ME READINGS IN ONE WEEK   [name] YOU CAN MYCHART ME THE READINGS  Take  you new stomach pill instead of nexium, twice daily on an empty stomach,  am and pre dinner   You can finish your nexium first but take it twice daily

## 2022-05-24 NOTE — Assessment & Plan Note (Signed)
She is using compression stockings rather than lasix  Given previous evidence of dehydration

## 2022-05-24 NOTE — Assessment & Plan Note (Signed)
Improved weight with gain of 5 lbs over the last 3 months with increased remeron dose and increased protein intake

## 2022-05-24 NOTE — Assessment & Plan Note (Signed)
Recommend treating reflux more aggressively,  Changing nexium to protonix,  Adding pre dinner dose ,  And advised her to use her wedge pillow

## 2022-05-24 NOTE — Assessment & Plan Note (Signed)
Elevated today on current regimen of Imdur 90 mg daily and losartan 100 mg daily. Amlodipine was stopped due to lower extremity edema.  Home readings requested.

## 2022-06-15 ENCOUNTER — Other Ambulatory Visit: Payer: Self-pay | Admitting: Cardiovascular Disease

## 2022-06-15 DIAGNOSIS — D0472 Carcinoma in situ of skin of left lower limb, including hip: Secondary | ICD-10-CM | POA: Diagnosis not present

## 2022-06-15 DIAGNOSIS — D0462 Carcinoma in situ of skin of left upper limb, including shoulder: Secondary | ICD-10-CM | POA: Diagnosis not present

## 2022-06-15 NOTE — Telephone Encounter (Signed)
Rx refill sent to pharmacy. 

## 2022-06-23 ENCOUNTER — Other Ambulatory Visit: Payer: Self-pay | Admitting: Cardiovascular Disease

## 2022-07-08 ENCOUNTER — Other Ambulatory Visit: Payer: Self-pay | Admitting: Family

## 2022-07-11 DIAGNOSIS — H353221 Exudative age-related macular degeneration, left eye, with active choroidal neovascularization: Secondary | ICD-10-CM | POA: Diagnosis not present

## 2022-07-18 NOTE — Progress Notes (Unsigned)
Cardiology Office Note  Date:  07/19/2022   ID:  [name], DOB 28-Nov-1925, MRN [id number]  PCP:  Katrina Mc, MD   Chief Complaint  Patient presents with   6 month follow up     "Doing well." Medications reviewed by the patient verbally.     HPI:  [name] is a pleasant 88 year old woman with a history of  CAD, bypass surgery in 1999, Stress test October 2017 with no ischemia COPD, Smoking for 40 years who quit in 1985,  chronic renal insufficiency,  hypertension,  hyperlipidemia,  40-50% bilateral carotid arterial disease  EF >60% in 05/2015  C. difficile in January 2015. H/o fall wet floor  02/2017, Suffered a pelvic fracture, left shoulder pain who presents for routine followup of her coronary artery disease  Last seen by myself in clinic January 2023 Presented to the emergency room May 14, 2022 with central chest pain, nonradiating Troponin negative x1  On discussion of her recent trip to the emergency room, she does not remember the details Denies having significant chest pain and when she does she takes nitroglycerin Again she has reiterated she does not want aggressive procedures or catheterizations  Denies significant leg swelling, no PND orthopnea  Echocardiogram July 2022  normal LV function, no mention of elevated right heart pressures, no significant TR  EKG personally reviewed by myself on todays visit Normal sinus rhythm rate 79 bpm nonspecific ST and T wave abnormality no change from prior EKG January 2023  Other past medical history reviewed Prior stress test in 2020 and 2019 Prior stress test reviewed with her September 2020, low risk  Previously treated with chemotherapy for cancer on right lower extremity  Hospital admission 12/21/2016 for COPD exacerbation  PMH:   has a past medical history of C. difficile colitis, CAD (coronary artery disease), Carotid artery disease ([place]), Closed fracture pubis ([place]) (07/04/2017), COPD (chronic  obstructive pulmonary disease) ([place]), Diffuse cystic mastopathy, Diverticulitis, Dizziness, Gait abnormality, GERD (gastroesophageal reflux disease), History of migraines, CABG, Hyperlipidemia, Hypertension, Indigestion, Kidney stones, Rheumatic fever, SCC (squamous cell carcinoma), face (08/01/2016), Sinus bradycardia, SOB (shortness of breath), and Syncopal episodes.  PSH:    Past Surgical History:  Procedure Laterality Date   APPENDECTOMY  1950   BREAST BIOPSY Right 1994   BREAST CYST ASPIRATION     multiple BIL   CATARACT EXTRACTION Right 2009   CATARACT EXTRACTION Left 2011   COLONOSCOPY  [id number]   Dr. Jamal Collin   CORONARY ARTERY BYPASS GRAFT  1999   esophagus stretched     [employer]   due to metrorrhagia    Current Outpatient Medications  Medication Sig Dispense Refill   atorvastatin (LIPITOR) 40 MG tablet TAKE 1 TABLET BY MOUTH DAILY 90 tablet 1   Bacillus Coagulans-Inulin (PROBIOTIC) 1-250 BILLION-MG CAPS Take 1 capsule by mouth daily. 30 capsule 0   clopidogrel (PLAVIX) 75 MG tablet TAKE 1 TABLET BY MOUTH DAILY 90 tablet 1   Cranberry 125 MG TABS Take 1 tablet by mouth daily.     furosemide (LASIX) 20 MG tablet TAKE 1 TABLET BY MOUTH NOT MORE THAN ONCE A WEEK AS NEEDED 12 tablet 3   Ipratropium-Albuterol (COMBIVENT RESPIMAT) 20-100 MCG/ACT AERS respimat INHALE ONE PUFF EVERY SIX HOURS 4 g 5   isosorbide mononitrate (IMDUR) 60 MG 24 hr tablet TAKE 1 AND 1/2 TABLETS BY MOUTH TWO TIMES DAILY 270 tablet 0   losartan (COZAAR) 100 MG tablet  TAKE 1 TABLET BY MOUTH DAILY 90 tablet 3   magic mouthwash (lidocaine, diphenhydrAMINE, alum & mag hydroxide) suspension Swish and spit 5 mLs 4 (four) times daily as needed for mouth pain. 360 mL 1   mirtazapine (REMERON) 15 MG tablet TAKE 1 TABLET BY MOUTH AT BEDTIME 30 tablet 2   nitroGLYCERIN (NITROSTAT) 0.4 MG SL tablet PLACE 1 TABLET UNDER TONGUE EVERY 5 MINUTES AS NEEDED FOR CHEST PAIN. IF NO  RELIEF IN 15 MINUTES CALL 911 ( MAXIMUM 3 TABLETS). 25 tablet 3   nystatin (MYCOSTATIN) 100000 UNIT/ML suspension Take 5 mLs (500,000 Units total) by mouth 4 (four) times daily. 60 mL 0   ondansetron (ZOFRAN) 4 MG tablet Take 1 tablet (4 mg total) by mouth every 8 (eight) hours as needed for nausea or vomiting. 40 tablet 3   pantoprazole (PROTONIX) 40 MG tablet Take 1 tablet (40 mg total) by mouth 2 (two) times daily before a meal. 180 tablet 3   Probiotic Product (PROBIOTIC PO) Take 1 capsule by mouth daily.     propranolol (INDERAL) 20 MG tablet TAKE ONE TABLET 3 TIMES DAILY AS NEEDED FOR TACHYCARDIA 270 tablet 3   ranolazine (RANEXA) 500 MG 12 hr tablet TAKE 1 TABLET BY MOUTH TWICE DAILY 180 tablet 3   vitamin B-12 (CYANOCOBALAMIN) 500 MCG tablet Take 500 mcg by mouth daily.     No current facility-administered medications for this visit.     Allergies:   Patient has no known allergies.   Social History:  The patient  reports that she quit smoking about 38 years ago. Her smoking use included cigarettes. She has a 20.00 pack-year smoking history. She has never used smokeless tobacco. She reports that she does not currently use alcohol. She reports that she does not use drugs.   Family History:   family history includes Heart disease in her father and mother.    Review of Systems: Review of Systems  Constitutional: Negative.   HENT: Negative.    Respiratory: Negative.    Cardiovascular: Negative.   Gastrointestinal: Negative.   Musculoskeletal: Negative.   Neurological: Negative.   Psychiatric/Behavioral: Negative.    All other systems reviewed and are negative.   PHYSICAL EXAM: VS:  BP (!) 160/70 (BP Location: Left Arm, Patient Position: Sitting, Cuff Size: Normal)   Pulse 79   Ht '5\' 3"'$  (1.6 m)   Wt 121 lb 8 oz (55.1 kg)   SpO2 97%   BMI 21.52 kg/m  , BMI Body mass index is 21.52 kg/m. Constitutional:  oriented to person, place, and time. No distress.  HENT:  Head:  Grossly normal Eyes:  no discharge. No scleral icterus.  Neck: No JVD, no carotid bruits  Cardiovascular: Regular rate and rhythm, no murmurs appreciated Trace pitting lower extremity edema bilaterally Pulmonary/Chest: Clear to auscultation bilaterally, no wheezes or rails Abdominal: Soft.  no distension.  no tenderness.  Musculoskeletal: Normal range of motion Neurological:  normal muscle tone. Coordination normal. No atrophy Skin: Skin warm and dry Psychiatric: normal affect, pleasant  Recent Labs: 11/18/2021: Magnesium 2.0 01/06/2022: TSH 5.39 05/14/2022: ALT 13; BUN 24; Creatinine, Ser 1.31; Hemoglobin 12.5; Platelets 186; Potassium 4.4; Sodium 136    Lipid Panel Lab Results  Component Value Date   CHOL 138 01/06/2022   HDL 59.50 01/06/2022   LDLCALC 60 01/06/2022   TRIG 93.0 01/06/2022    Wt Readings from Last 3 Encounters:  07/19/22 121 lb 8 oz (55.1 kg)  05/24/22 120 lb 3.2 oz (54.5  kg)  05/14/22 115 lb (52.2 kg)     ASSESSMENT AND PLAN:  Essential hypertension -  As on prior office visits, blood pressure running high Reports that she was rushing into the office, stressful -No medication changes made,  Suggested she monitor blood pressure at home and call us with numbers Recommend we like to keep her blood pressure less than 140 Could consider carvedilol, hydralazine if needed  Coronary artery disease involving native coronary artery of native heart without angina pectoris -  Chronic stable angina She does not want cardiac catheterization, again this was discussed with her She would be a high risk catheterization given her age and complexity of disease Recommended continued use of nitro sublingual, Continue isosorbide, Ranexa We have stressed the importance of aggressive blood pressure control  Shortness of breath COPD, chronic angina Recommended use of inhalers, nitro, Lasix sparingly Blood pressure control recommended  History of bypass surgery Nitro as  needed Previously declined catheterization   Carotid stenosis Less than 39% bilaterally, significant plaque noted Cholesterol at goal  Hyperlipidemia Cholesterol is at goal on the current lipid regimen. No changes to the medications were made.  Leg swelling No significant leg swelling noted on today's visit Lasix as needed   Total encounter time more than 30 minutes  Greater than 50% was spent in counseling and coordination of care with the patient    No orders of the defined types were placed in this encounter.    Signed, Esmond Plants, M.D., Ph.D. 07/19/2022  [place], [place]

## 2022-07-19 ENCOUNTER — Ambulatory Visit: Payer: Medicare Other | Attending: Cardiovascular Disease | Admitting: Cardiovascular Disease

## 2022-07-19 ENCOUNTER — Encounter: Payer: Self-pay | Admitting: Cardiovascular Disease

## 2022-07-19 VITALS — BP 160/70 | HR 79 | Ht 63.0 in | Wt 121.5 lb

## 2022-07-19 DIAGNOSIS — I10 Essential (primary) hypertension: Secondary | ICD-10-CM | POA: Diagnosis not present

## 2022-07-19 DIAGNOSIS — I700 Atherosclerosis of aorta: Secondary | ICD-10-CM | POA: Diagnosis not present

## 2022-07-19 DIAGNOSIS — Z951 Presence of aortocoronary bypass graft: Secondary | ICD-10-CM | POA: Insufficient documentation

## 2022-07-19 DIAGNOSIS — I25118 Atherosclerotic heart disease of native coronary artery with other forms of angina pectoris: Secondary | ICD-10-CM | POA: Diagnosis not present

## 2022-07-19 DIAGNOSIS — I6523 Occlusion and stenosis of bilateral carotid arteries: Secondary | ICD-10-CM | POA: Insufficient documentation

## 2022-07-19 DIAGNOSIS — N183 Chronic kidney disease, stage 3 unspecified: Secondary | ICD-10-CM | POA: Insufficient documentation

## 2022-07-19 DIAGNOSIS — I5032 Chronic diastolic (congestive) heart failure: Secondary | ICD-10-CM | POA: Diagnosis not present

## 2022-07-19 DIAGNOSIS — E785 Hyperlipidemia, unspecified: Secondary | ICD-10-CM | POA: Insufficient documentation

## 2022-07-19 MED ORDER — LOSARTAN POTASSIUM 100 MG PO TABS: 100.0000 mg | ORAL_TABLET | Freq: Every day | ORAL | 3 refills | Status: DC

## 2022-07-19 MED ORDER — CLOPIDOGREL BISULFATE 75 MG PO TABS: 75.0000 mg | ORAL_TABLET | Freq: Every day | ORAL | 3 refills | Status: DC

## 2022-07-19 MED ORDER — RANOLAZINE ER 500 MG PO TB12: 500.0000 mg | ORAL_TABLET | Freq: Two times a day (BID) | ORAL | 3 refills | Status: DC

## 2022-07-19 NOTE — Patient Instructions (Addendum)
Your physician recommends that you continue on your current medications as directed. Please refer to the Current Medication list given to you today.   Please keep a record of your blood pressure readings and if you see your blood pressure continues to run 140 or higher, give our office a call.   If you need a refill on your cardiac medications before your next appointment, please call your pharmacy.   Lab work: No new labs needed  Testing/Procedures: No new testing needed  Follow-Up: At [facility], you and your health needs are our priority.  As part of our continuing mission to provide you with exceptional heart care, we have created designated Provider Care Teams.  These Care Teams include your primary Cardiologist (physician) and Advanced Practice Providers (APPs -  Physician Assistants and Nurse Practitioners) who all work together to provide you with the care you need, when you need it.  You will need a follow up appointment in 6 months  Providers on your designated Care Team:   Murray Hodgkins, NP Christell Faith, PA-C Cadence Kathlen Mody, [place]  COVID-19 Vaccine Information can be found at: [email or web address].uk For questions related to vaccine distribution or appointments, please email [email or web address] or call [phone].

## 2022-08-04 ENCOUNTER — Other Ambulatory Visit: Payer: Self-pay | Admitting: Internal Medicine

## 2022-08-04 DIAGNOSIS — Z1231 Encounter for screening mammogram for malignant neoplasm of breast: Secondary | ICD-10-CM

## 2022-08-08 ENCOUNTER — Telehealth: Payer: Self-pay | Admitting: Internal Medicine

## 2022-08-08 MED ORDER — MIRTAZAPINE 15 MG PO TABS: 15.0000 mg | ORAL_TABLET | Freq: Every day | ORAL | 1 refills | Status: DC

## 2022-08-08 NOTE — Telephone Encounter (Signed)
Medication has been refilled.

## 2022-08-08 NOTE — Addendum Note (Signed)
Addended by: Adair Laundry on: 08/08/2022 12:57 PM   Modules accepted: Orders

## 2022-08-08 NOTE — Telephone Encounter (Signed)
Patient is requesting a refill on her mirtazapine (REMERON) 15 MG tablet.

## 2022-08-09 DIAGNOSIS — Z23 Encounter for immunization: Secondary | ICD-10-CM | POA: Diagnosis not present

## 2022-08-14 ENCOUNTER — Other Ambulatory Visit: Payer: Self-pay | Admitting: Cardiovascular Disease

## 2022-08-24 ENCOUNTER — Encounter: Payer: Self-pay | Admitting: Internal Medicine

## 2022-08-24 ENCOUNTER — Ambulatory Visit (INDEPENDENT_AMBULATORY_CARE_PROVIDER_SITE_OTHER): Payer: Medicare Other | Admitting: Internal Medicine

## 2022-08-24 VITALS — BP 182/76 | HR 72 | Temp 97.8°F | Ht 63.0 in | Wt 119.6 lb

## 2022-08-24 DIAGNOSIS — R531 Weakness: Secondary | ICD-10-CM

## 2022-08-24 DIAGNOSIS — I6523 Occlusion and stenosis of bilateral carotid arteries: Secondary | ICD-10-CM | POA: Diagnosis not present

## 2022-08-24 DIAGNOSIS — R0602 Shortness of breath: Secondary | ICD-10-CM

## 2022-08-24 DIAGNOSIS — R143 Flatulence: Secondary | ICD-10-CM | POA: Diagnosis not present

## 2022-08-24 DIAGNOSIS — R627 Adult failure to thrive: Secondary | ICD-10-CM

## 2022-08-24 DIAGNOSIS — M6281 Muscle weakness (generalized): Secondary | ICD-10-CM

## 2022-08-24 DIAGNOSIS — E441 Mild protein-calorie malnutrition: Secondary | ICD-10-CM | POA: Diagnosis not present

## 2022-08-24 MED ORDER — AMLODIPINE BESYLATE 2.5 MG PO TABS: 2.5000 mg | ORAL_TABLET | Freq: Every day | ORAL | 1 refills | Status: DC

## 2022-08-24 NOTE — Patient Instructions (Addendum)
Gas is part of life.  It gets worse as you get older Try using beano before any meal with any beans , cabbage,  Broccoli, cauliflower and brussel sprouts   Your blood pressure is VERY HIGH. I am adding amlodipine for your blood pressure starting today  once daily:   put IT IN  your evening pill box   CONTINUE TAKING : Isosorbide is 1.5 tablets  Twice daily  Losartan (COZAAR)   once daily   PHYSICAL THERAPY HAS BEEN ORDERED  YOUR BREAST EXAM IS NORMAL

## 2022-08-24 NOTE — Progress Notes (Unsigned)
Subjective:  Patient ID: [name], female    DOB: 1925/09/26  Age: 88 y.o. MRN: [id number]  CC: There were no encounter diagnoses.   HPI [name] presents for  Chief Complaint  Patient presents with   Follow-up    3 month follow up    1) Excessive gas  2) "weakness"  :  gets short of breath going to the store,  going up and down stairs , denies  chest pain . [place]ed  in PT [place] [address]    Outpatient Medications Prior to Visit  Medication Sig Dispense Refill   atorvastatin (LIPITOR) 40 MG tablet TAKE 1 TABLET BY MOUTH DAILY 90 tablet 1   Bacillus Coagulans-Inulin (PROBIOTIC) 1-250 BILLION-MG CAPS Take 1 capsule by mouth daily. 30 capsule 0   clopidogrel (PLAVIX) 75 MG tablet Take 1 tablet (75 mg total) by mouth daily. 90 tablet 3   Cranberry 125 MG TABS Take 1 tablet by mouth daily.     furosemide (LASIX) 20 MG tablet TAKE 1 TABLET BY MOUTH NOT MORE THAN ONCE A WEEK AS NEEDED 12 tablet 3   Ipratropium-Albuterol (COMBIVENT RESPIMAT) 20-100 MCG/ACT AERS respimat INHALE ONE PUFF EVERY SIX HOURS 4 g 5   isosorbide mononitrate (IMDUR) 60 MG 24 hr tablet TAKE ONE AND ONE-HALF TABLET BY MOUTH TWICE DAILY 270 tablet 0   losartan (COZAAR) 100 MG tablet Take 1 tablet (100 mg total) by mouth daily. 90 tablet 3   magic mouthwash (lidocaine, diphenhydrAMINE, alum & mag hydroxide) suspension Swish and spit 5 mLs 4 (four) times daily as needed for mouth pain. 360 mL 1   mirtazapine (REMERON) 15 MG tablet Take 1 tablet (15 mg total) by mouth at bedtime. 90 tablet 1   nitroGLYCERIN (NITROSTAT) 0.4 MG SL tablet PLACE 1 TABLET UNDER TONGUE EVERY 5 MINUTES AS NEEDED FOR CHEST PAIN. IF NO RELIEF IN 15 MINUTES CALL 911 ( MAXIMUM 3 TABLETS). 25 tablet 3   nystatin (MYCOSTATIN) 100000 UNIT/ML suspension Take 5 mLs (500,000 Units total) by mouth 4 (four) times daily. 60 mL 0   ondansetron (ZOFRAN) 4 MG tablet Take 1 tablet (4 mg total) by mouth every 8 (eight) hours as needed for  nausea or vomiting. 40 tablet 3   pantoprazole (PROTONIX) 40 MG tablet Take 1 tablet (40 mg total) by mouth 2 (two) times daily before a meal. 180 tablet 3   Probiotic Product (PROBIOTIC PO) Take 1 capsule by mouth daily.     propranolol (INDERAL) 20 MG tablet TAKE ONE TABLET 3 TIMES DAILY AS NEEDED FOR TACHYCARDIA 270 tablet 3   ranolazine (RANEXA) 500 MG 12 hr tablet Take 1 tablet (500 mg total) by mouth 2 (two) times daily. 180 tablet 3   vitamin B-12 (CYANOCOBALAMIN) 500 MCG tablet Take 500 mcg by mouth daily.     No facility-administered medications prior to visit.    Review of Systems;  Patient denies headache, fevers, malaise, unintentional weight loss, skin rash, eye pain, sinus congestion and sinus pain, sore throat, dysphagia,  hemoptysis , cough, dyspnea, wheezing, chest pain, palpitations, orthopnea, edema, abdominal pain, nausea, melena, diarrhea, constipation, flank pain, dysuria, hematuria, urinary  Frequency, nocturia, numbness, tingling, seizures,  Focal weakness, Loss of consciousness,  Tremor, insomnia, depression, anxiety, and suicidal ideation.      Objective:  BP (!) 182/76   Pulse 72   Temp 97.8 F (36.6 C) (Oral)   Ht '5\' 3"'$  (1.6 m)   Wt 119 lb 9.6 oz (54.3  kg)   SpO2 97%   BMI 21.19 kg/m   BP Readings from Last 3 Encounters:  08/24/22 (!) 182/76  07/19/22 (!) 160/70  05/24/22 (!) 144/68    Wt Readings from Last 3 Encounters:  08/24/22 119 lb 9.6 oz (54.3 kg)  07/19/22 121 lb 8 oz (55.1 kg)  05/24/22 120 lb 3.2 oz (54.5 kg)    General appearance: alert, cooperative and appears stated age Ears: normal TM's and external ear canals both ears Throat: lips, mucosa, and tongue normal; teeth and gums normal Neck: no adenopathy, no carotid bruit, supple, symmetrical, trachea midline and thyroid not enlarged, symmetric, no tenderness/mass/nodules Back: symmetric, no curvature. ROM normal. No CVA tenderness. Lungs: clear to auscultation bilaterally Heart:  regular rate and rhythm, S1, S2 normal, no murmur, click, rub or gallop Abdomen: soft, non-tender; bowel sounds normal; no masses,  no organomegaly Pulses: 2+ and symmetric Skin: Skin color, texture, turgor normal. No rashes or lesions Lymph nodes: Cervical, supraclavicular, and axillary nodes normal. Neuro:  awake and interactive with normal mood and affect. Higher cortical functions are normal. Speech is clear without word-finding difficulty or dysarthria. Extraocular movements are intact. Visual fields of both eyes are grossly intact. Sensation to light touch is grossly intact bilaterally of upper and lower extremities. Motor examination shows 4+/5 symmetric hand grip and upper extremity and 5/5 lower extremity strength. There is no pronation or drift. Gait is non-ataxic   Lab Results  Component Value Date   HGBA1C 5.8 01/30/2017   HGBA1C 5.8 09/26/2016    Lab Results  Component Value Date   CREATININE 1.31 (H) 05/14/2022   CREATININE 1.37 (H) 01/06/2022   CREATININE 1.43 (H) 11/29/2021    Lab Results  Component Value Date   WBC 6.7 05/14/2022   HGB 12.5 05/14/2022   HCT 37.9 05/14/2022   PLT 186 05/14/2022   GLUCOSE 112 (H) 05/14/2022   CHOL 138 01/06/2022   TRIG 93.0 01/06/2022   HDL 59.50 01/06/2022   LDLDIRECT 72.0 11/09/2015   LDLCALC 60 01/06/2022   ALT 13 05/14/2022   AST 19 05/14/2022   NA 136 05/14/2022   K 4.4 05/14/2022   CL 105 05/14/2022   CREATININE 1.31 (H) 05/14/2022   BUN 24 (H) 05/14/2022   CO2 24 05/14/2022   TSH 5.39 01/06/2022   INR 0.9 01/31/2018   HGBA1C 5.8 01/30/2017   MICROALBUR 2.5 (H) 12/26/2011    DG Chest 2 View  Result Date: 05/14/2022 CLINICAL DATA:  Chest pain EXAM: CHEST - 2 VIEW COMPARISON:  11/18/2021 FINDINGS: Cardiac shadow is enlarged but stable. Postsurgical changes are again seen. Lungs are clear bilaterally. No acute bony abnormality is seen. IMPRESSION: No acute abnormality noted. Electronically Signed   By: Inez Catalina  M.D.   On: 05/14/2022 03:12    Assessment & Plan:   Problem List Items Addressed This Visit   None   I spent a total of   minutes with this patient in a face to face visit on the date of this encounter reviewing the last office visit with me in       ,  most recent visit with cardiology ,    ,  patient's diet and exercise habits, home blood pressure /blod sugar readings, recent ER visit including labs and imaging studies ,   and post visit ordering of testing and therapeutics.    Follow-up: No follow-ups on file.   [name], MD

## 2022-08-25 DIAGNOSIS — R627 Adult failure to thrive: Secondary | ICD-10-CM | POA: Insufficient documentation

## 2022-08-25 DIAGNOSIS — R143 Flatulence: Secondary | ICD-10-CM | POA: Insufficient documentation

## 2022-08-25 NOTE — Assessment & Plan Note (Signed)
Diet and bowel habits reviewed,  advised to add beano

## 2022-08-25 NOTE — Assessment & Plan Note (Signed)
Suggested by weight loss increased fatigue

## 2022-08-25 NOTE — Assessment & Plan Note (Signed)
PT referral made

## 2022-08-25 NOTE — Assessment & Plan Note (Signed)
multifactorial with CAD and COPD contributing. She is becoming more sedentary to remain asymptomatic and is now reporting fatigue with minimal exertion.  PT referral made

## 2022-08-25 NOTE — Assessment & Plan Note (Addendum)
She had a gain of 5 lbs over the last 3 months with increased remeron dose and increased protein intake but has lost 2 lbs since October

## 2022-09-05 ENCOUNTER — Telehealth: Payer: Self-pay | Admitting: Internal Medicine

## 2022-09-05 NOTE — Telephone Encounter (Signed)
Pt daughter called wanting to know about the pt referral to physical therapy. No one has called the pt yet

## 2022-09-20 NOTE — Telephone Encounter (Signed)
[name], patient's daughter called. Patient still has not heard from PT. Please call [name] at [phone] mother can not hear on phone very well.

## 2022-10-05 ENCOUNTER — Ambulatory Visit: Payer: No Typology Code available for payment source | Admitting: Physical Therapy

## 2022-10-07 ENCOUNTER — Encounter: Payer: Self-pay | Admitting: Internal Medicine

## 2022-10-07 ENCOUNTER — Other Ambulatory Visit: Payer: Self-pay | Admitting: Internal Medicine

## 2022-10-11 ENCOUNTER — Encounter: Payer: Self-pay | Admitting: Internal Medicine

## 2022-10-12 ENCOUNTER — Encounter: Payer: Medicare Other | Admitting: Physical Therapy

## 2022-10-17 DIAGNOSIS — H353221 Exudative age-related macular degeneration, left eye, with active choroidal neovascularization: Secondary | ICD-10-CM | POA: Diagnosis not present

## 2022-10-18 ENCOUNTER — Encounter: Payer: Medicare Other | Admitting: Physical Therapy

## 2022-10-25 ENCOUNTER — Ambulatory Visit: Payer: No Typology Code available for payment source | Attending: Internal Medicine | Admitting: Physical Therapy

## 2022-10-25 ENCOUNTER — Encounter: Payer: Medicare Other | Admitting: Physical Therapy

## 2022-10-25 DIAGNOSIS — R2681 Unsteadiness on feet: Secondary | ICD-10-CM | POA: Insufficient documentation

## 2022-10-25 DIAGNOSIS — R531 Weakness: Secondary | ICD-10-CM | POA: Diagnosis not present

## 2022-10-25 DIAGNOSIS — M6281 Muscle weakness (generalized): Secondary | ICD-10-CM | POA: Diagnosis not present

## 2022-10-25 NOTE — Therapy (Signed)
OUTPATIENT PHYSICAL THERAPY THORACOLUMBAR EVALUATION   Patient Name: [name] MRN: [id number] DOB:11/08/1925, 89 y.o., female Today's Date: 10/26/2022  END OF SESSION:  PT End of Session - 10/26/22 0955     Visit Number 1    Number of Visits 17    Date for PT Re-Evaluation 01/17/23    Authorization - Visit Number 1    Authorization - Number of Visits 17    Progress Note Due on Visit 10    PT Start Time 1300    PT Stop Time 1345    PT Time Calculation (min) 45 min    Activity Tolerance Patient tolerated treatment well    Behavior During Therapy [facility] for tasks assessed/performed             Past Medical History:  Diagnosis Date   C. difficile colitis    CAD (coronary artery disease)    a. s/p CABG 1999; b. Nuclear 10/11, no scar or ischemia, EF 68%; b. [place] 06/18/14: no evidence of infarction or ischemia, EF 77%, low risk study; c. 02/2019 MV: EF 56%, ? mild lat ischemia->low risk.   Carotid artery disease ([place])    a. Doppler January, 2012, 40-59% bilateral; b. 02/2017 U/S: 1-39% bilat ICA stenosis.   Closed fracture pubis ([place]) 07/04/2017   COPD (chronic obstructive pulmonary disease) (HCC)    Diffuse cystic mastopathy    Diverticulitis    last colonoscopy  incomplete Jan 2011   Dizziness    stable now (Oct 2011) patient tells me question of TIA, we will obtain records   Gait abnormality    Patient complains of "wobbly gait", December, 2013   GERD (gastroesophageal reflux disease)    History of migraines    Hx of CABG    a. 3 vessel in 1999   Hyperlipidemia    Hypertension    Indigestion    3 weeks, October 2011   Kidney stones    Rheumatic fever    SCC (squamous cell carcinoma), face 08/01/2016   Sinus bradycardia    Mild, December, 2013   SOB (shortness of breath)    Syncopal episodes    after 18 holes of golf and increase hydrochlorothiazide, resolved    Past Surgical History:  Procedure Laterality Date   APPENDECTOMY  1950   BREAST  BIOPSY Right 1994   BREAST CYST ASPIRATION     multiple BIL   CATARACT EXTRACTION Right 2009   CATARACT EXTRACTION Left 2011   COLONOSCOPY  3009,2330   Dr. 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   Oral thrush 11/22/2021   Mild cognitive impairment with memory loss 10/12/2021   Hyperparathyroidism due to renal insufficiency ([place]) 02/02/2021   CKD (chronic kidney disease) stage 3, GFR 30-59 ml/min ([place]) 01/15/2021   Grief 04/08/2020   History of esophageal stricture 04/08/2020   Leg swelling 03/09/2020   Atypical chest pain 01/10/2020   B12 deficiency 01/09/2020   Generalized weakness 07/14/2019   Mild malnutrition (  [place]) 33/29/5188   Diastolic CHF, chronic ([place]) 03/25/2019   Vitamin D deficiency 03/25/2019   S/P excision of skin lesion, follow-up exam 12/26/2017   Impingement syndrome of left shoulder region 07/04/2017   Allergic rhinitis 12/31/2016   Osteoporosis of forearm 02/13/2016   Insomnia secondary to anxiety 07/12/2015   Anxiety disorder 07/12/2015   Essential (primary) hypertension 10/07/2014   Sinus bradycardia    Hardening of the aorta (main artery of the heart) ([place]) 06/26/2014   History of Clostridium difficile colitis 10/20/2013   History of pancreatitis 10/20/2013   Low back pain 10/04/2013   Do not resuscitate discussion 02/07/2013   Chest pain at rest 12/26/2011   COPD, moderate ([place]) 12/05/2011   Presence of aortocoronary bypass graft    Stage 3b chronic kidney disease  ([place]) 09/25/2011   Hyperlipidemia    SOB (shortness of breath)    Coronary artery disease of native artery of native heart with stable angina pectoris ([place])    Carotid artery disease ([place])     PCP: Deborra Medina MD  REFERRING PROVIDER: Deborra Medina MD  REFERRING DIAG: unsteadiness  Rationale for Evaluation and Treatment: Rehabilitation  THERAPY DIAG:  Muscle weakness (generalized)  Unsteadiness on feet  ONSET DATE: ~44month  SUBJECTIVE:                                                                                                                                                                                             SUBJECTIVE STATEMENT:  Generalized muscle weakness; unsteadiness on feet  PERTINENT HISTORY: Pt is a 89y.o. female who presents with unsteadiness on feet that began about 4 months ago. Pt reports she started to feel "wobbly and unsteady". MD told pt she has generalized weakness and her response was "that's just what happens when you're this old". Unsteadiness on feet ISQ since onset. Ambulates independently without an AD. No falls in the last 6 months. Pt owns a SPC but does not use it. Pt lives alone in a 2-story house close to the clinic and her daughter visits very often. Her room is upstairs; she walks up ~10 steps holding onto the L handrail with both hands to get to her room. Pt drives independently with no issues. Pt remains independent with all ADLs including grocery shopping with the exception of big loads (her daughter helps her out with that), cleaning (she has help for that), cooking ("not because I can't but because I don't want to do it"). PMH: heart surgery in 1999, COPD, SOB intermittently. Pt denies N/V, B&B changes, unexplained weight fluctuation, saddle paresthesia, fever, night sweats, or unrelenting night pain at  this time. No falls in past 6 months.   Pt accompanied by: self   PAIN:  Are you having pain? No  PRECAUTIONS: Other: Fall  risk  WEIGHT BEARING RESTRICTIONS: No  FALLS: Has patient fallen in last 6 months? No  LIVING ENVIRONMENT: Lives with: lives with their family and lives alone Lives in: House/apartment Stairs: Yes: Internal: 10 steps; on left going up and External: 3-4 steps; can reach both, walkway to front door - 3-4 steps; can reach both Has following equipment at home: Single point cane  PLOF: Independent  PATIENT GOALS: Pt does not want to feel wobbly. Wants to feel more stable and prevent the risk of falls.  OBJECTIVE:   DIAGNOSTIC FINDINGS: N/A  COGNITION: Overall cognitive status: Within functional limits for tasks assessed   SENSATION: WFL  COORDINATION: Slowed coordination  EDEMA: N/A   MUSCLE TONE:  Decreased muscle tone bilaterally  MUSCLE LENGTH: Not assessed  DTRs:   Not assessed   POSTURE: No significant postural limitations  LOWER EXTREMITY ROM:     Active  Right Eval Left Eval  Hip flexion 102 102  Hip extension 120 120  Hip abduction    Hip adduction    Hip internal rotation    Hip external rotation    Knee flexion    Knee extension    Ankle dorsiflexion    Ankle plantarflexion    Ankle inversion    Ankle eversion     (Blank rows = not tested)  LOWER EXTREMITY MMT:    MMT Right Eval Left Eval  Hip flexion 3 3  Hip extension 3 3  Hip abduction 3 3  Hip adduction    Hip internal rotation    Hip external rotation    Knee flexion    Knee extension    Ankle dorsiflexion    Ankle plantarflexion    Ankle inversion    Ankle eversion    (Blank rows = not tested)  BED MOBILITY:  Sit to supine Complete Independence Supine to sit Complete Independence Pt uses walker for bed transfer   TRANSFERS: Assistive device utilized: None  Sit to stand: Modified independence Stand to sit: Used bilateral UE to stand Floor: Complete Independence   STAIRS:  Deferred to next session  GAIT: Gait pattern: decreased step length- Left, decreased  stride length, decreased hip/knee flexion- Right, decreased hip/knee flexion- Left, decreased ankle dorsiflexion- Right, decreased ankle dorsiflexion- Left, shuffling, narrow BOS, poor foot clearance- Right, and poor foot clearance- Left Distance walked: 10 meters Assistive device utilized: None Level of assistance: Complete Independence Comments: shuffling gait pattern  FUNCTIONAL TESTS:  5 times sit to stand: 18.09 secs (pt used arms to push off her quads to assist in transfer) Timed up and go (TUG): 15.44 secs 10 meter walk test: self-selected - 0.83 m/s; fastest - 1.0 m/s Dynamic Gait Index: 15/24  PATIENT SURVEYS:  FOTO 41  TODAY'S TREATMENT:  DATE: 10/25/22 Therex: Standing marches with counter support x 10 (bilaterally) Standing hip abduction with counter support x 8 (bilaterally) HS stretch - 1 x 30 seconds (bilaterally)     PATIENT EDUCATION: Education details: Pt was educated on diagnosis, anatomy and pathology involved, prognosis, role of PT, and was given an HEP, demonstrating exercises with proper form following verbal and tactile cues, and was given a paper hand out to continue exercise at home. Pt was educated on and agreed to plan of care Person educated: Patient Education method: Explanation, Demonstration, Tactile cues, Verbal cues, and Handouts Education comprehension: verbalized understanding, returned demonstration, and verbal cues required  HOME EXERCISE PROGRAM: Standing marches - 1 x weekly - 2 sets - 8-12 reps Standing hip abduction with counter support - 1 x weekly  HS stretch - 3 x 30 seconds 2x/day    ASSESSMENT:  CLINICAL IMPRESSION: Patient is a 89 y.o. female who was seen today for physical therapy evaluation and treatment for unsteadiness on feet. Pt exhibited S&S that align with generalized muscle weakness. AROM testing was  WNL and pt did not have any pain. Impairments in decreased bilateral hip strength, decreased LLE motor control, decreased balance, decreased global muscular endurance, abnormal gait mechanics, and pain. Activity limitations in lifting, carrying, stairs, transfers, bed mobility, and locomotion level. Participation limitations include inhibition of safe household and community ambulation. Pt would benefit from skilled PT to address the aforementioned impairments and improve overall QoL.    OBJECTIVE IMPAIRMENTS: Abnormal gait, decreased activity tolerance, decreased balance, decreased coordination, decreased endurance, decreased mobility, difficulty walking, decreased ROM, decreased strength, increased fascial restrictions, impaired flexibility, improper body mechanics, and postural dysfunction.   ACTIVITY LIMITATIONS: carrying, lifting, bending, squatting, stairs, transfers, bed mobility, dressing, and hygiene/grooming  PARTICIPATION LIMITATIONS: meal prep, cleaning, laundry, shopping, and community activity  PERSONAL FACTORS: Age, Education, Fitness, Past/current experiences, Time since onset of injury/illness/exacerbation, and 3+ comorbidities: COPD, CAD, OA  are also affecting patient's functional outcome.   REHAB POTENTIAL: Good  CLINICAL DECISION MAKING: Evolving/moderate complexity  EVALUATION COMPLEXITY: Moderate   GOALS: Goals reviewed with patient? Yes  SHORT TERM GOALS: Target date: 11/24/22  Pt will be independent with HEP in order to improve strength and balance in order to decrease fall risk and improve function at home and work. Baseline: 10/26/22 HEP given  Goal status: INITIAL    LONG TERM GOALS: Target date: 01/08/23  Pt will demonstrate 10MWT speed of at least 1.0 m/s to demonstrate decreased fall risk and increased independence in community ambulation Baseline: Baseline: self-selected - 0.83 m/s; fastest - 0.91 m/s Goal status: INITIAL  2.  Pt will increase FOTO score  to 57 to demonstrate predicted increase in functional mobility to complete ADLs Baseline: 41 Goal status: INITIAL  3.  Pt will ascend 10 steps only using LUE to hold onto the L handrail for increased independence to complete ADLs Baseline: Unsteadiness with 10 steps and requires both hands on L handrail Goal status: INITIAL  4.  Pt will demonstrate TUG of 13 secs or less in order to demonstrate decreased fall risk Baseline: 18.09 secs Goal status: INITIAL 5.  Pt will improve DGI by to least 19/24 in order to demonstrate clinically significant improvement in balance for decreased risk for falls  Baseline: 15/24 Goal status: INITIAL 6.  Pt will decrease 5TSTS to at least 13 seconds in order to demonstrate clinically significant improvement in LE strength to age matched norms, needed for increased independence with basic transfers  Baseline: 10/26/22  18.09 secs (pt used arms to push off her quads to assist in transfer) Goal status: INITIAL     PLAN:  PT FREQUENCY: 1-2x/week  PT DURATION: 8 weeks  PLANNED INTERVENTIONS: Therapeutic exercises, Therapeutic activity, Neuromuscular re-education, Balance training, Gait training, Patient/Family education, Self Care, Joint mobilization, Joint manipulation, Stair training, DME instructions, Aquatic Therapy, Dry Needling, Electrical stimulation, Spinal manipulation, Spinal mobilization, Cryotherapy, Moist heat, Traction, Ultrasound, Ionotophoresis '4mg'$ /ml Dexamethasone, Manual therapy, and Re-evaluation.  PLAN FOR NEXT SESSION: Update HEP. Test grip strength. Test stairs. Incorporate dynamic stabilization with ramps and curbs.   Stanford [place] SPT  Durwin Reges DPT Durwin Reges, PT 10/26/2022, 1:36 PM

## 2022-10-26 ENCOUNTER — Encounter: Payer: Self-pay | Admitting: Physical Therapy

## 2022-10-26 DIAGNOSIS — L570 Actinic keratosis: Secondary | ICD-10-CM | POA: Diagnosis not present

## 2022-10-26 DIAGNOSIS — D2261 Melanocytic nevi of right upper limb, including shoulder: Secondary | ICD-10-CM | POA: Diagnosis not present

## 2022-10-26 DIAGNOSIS — L821 Other seborrheic keratosis: Secondary | ICD-10-CM | POA: Diagnosis not present

## 2022-10-26 DIAGNOSIS — D225 Melanocytic nevi of trunk: Secondary | ICD-10-CM | POA: Diagnosis not present

## 2022-10-26 DIAGNOSIS — D2271 Melanocytic nevi of right lower limb, including hip: Secondary | ICD-10-CM | POA: Diagnosis not present

## 2022-10-26 DIAGNOSIS — D0472 Carcinoma in situ of skin of left lower limb, including hip: Secondary | ICD-10-CM | POA: Diagnosis not present

## 2022-10-26 DIAGNOSIS — D485 Neoplasm of uncertain behavior of skin: Secondary | ICD-10-CM | POA: Diagnosis not present

## 2022-10-26 DIAGNOSIS — D2272 Melanocytic nevi of left lower limb, including hip: Secondary | ICD-10-CM | POA: Diagnosis not present

## 2022-10-26 DIAGNOSIS — L814 Other melanin hyperpigmentation: Secondary | ICD-10-CM | POA: Diagnosis not present

## 2022-10-26 DIAGNOSIS — D2262 Melanocytic nevi of left upper limb, including shoulder: Secondary | ICD-10-CM | POA: Diagnosis not present

## 2022-11-03 ENCOUNTER — Ambulatory Visit: Payer: No Typology Code available for payment source | Admitting: Physical Therapy

## 2022-11-08 ENCOUNTER — Ambulatory Visit: Payer: No Typology Code available for payment source | Admitting: Physical Therapy

## 2022-11-10 ENCOUNTER — Ambulatory Visit: Payer: No Typology Code available for payment source | Admitting: Physical Therapy

## 2022-11-10 ENCOUNTER — Encounter: Payer: Self-pay | Admitting: Physical Therapy

## 2022-11-10 DIAGNOSIS — M6281 Muscle weakness (generalized): Secondary | ICD-10-CM | POA: Diagnosis not present

## 2022-11-10 DIAGNOSIS — R2681 Unsteadiness on feet: Secondary | ICD-10-CM

## 2022-11-10 NOTE — Therapy (Signed)
OUTPATIENT PHYSICAL THERAPY THORACOLUMBAR TREATMENT   Patient Name: [name] MRN: [id number] DOB:September 08, 1926, 89 y.o., female Today's Date: 11/11/2022  END OF SESSION:  PT End of Session - 11/10/22 1440     Visit Number 2    Number of Visits 17    Date for PT Re-Evaluation 01/17/23    PT Start Time 1345    PT Stop Time 1430    PT Time Calculation (min) 45 min    Activity Tolerance Patient tolerated treatment well    Behavior During Therapy [facility] for tasks assessed/performed              Past Medical History:  Diagnosis Date   C. difficile colitis    CAD (coronary artery disease)    a. s/p CABG 1999; b. Nuclear 10/11, no scar or ischemia, EF 68%; b. [place] 06/18/14: no evidence of infarction or ischemia, EF 77%, low risk study; c. 02/2019 MV: EF 56%, ? mild lat ischemia->low risk.   Carotid artery disease ([place])    a. Doppler January, 2012, 40-59% bilateral; b. 02/2017 U/S: 1-39% bilat ICA stenosis.   Closed fracture pubis ([place]) 07/04/2017   COPD (chronic obstructive pulmonary disease) (HCC)    Diffuse cystic mastopathy    Diverticulitis    last colonoscopy  incomplete Jan 2011   Dizziness    stable now (Oct 2011) patient tells me question of TIA, we will obtain records   Gait abnormality    Patient complains of "wobbly gait", December, 2013   GERD (gastroesophageal reflux disease)    History of migraines    Hx of CABG    a. 3 vessel in 1999   Hyperlipidemia    Hypertension    Indigestion    3 weeks, October 2011   Kidney stones    Rheumatic fever    SCC (squamous cell carcinoma), face 08/01/2016   Sinus bradycardia    Mild, December, 2013   SOB (shortness of breath)    Syncopal episodes    after 18 holes of golf and increase hydrochlorothiazide, resolved    Past Surgical History:  Procedure Laterality Date   APPENDECTOMY  1950   BREAST BIOPSY Right 1994   BREAST CYST ASPIRATION     multiple BIL   CATARACT EXTRACTION Right 2009    CATARACT EXTRACTION Left 2011   COLONOSCOPY  [id number]   Dr. 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   Oral thrush 11/22/2021   Mild cognitive impairment with memory loss 10/12/2021   Hyperparathyroidism due to renal insufficiency ([place]) 02/02/2021   CKD (chronic kidney disease) stage 3, GFR 30-59 ml/min ([place]) 01/15/2021   Grief 04/08/2020   History of esophageal stricture 04/08/2020   Leg swelling 03/09/2020   Atypical chest pain 01/10/2020   B12 deficiency 01/09/2020   Generalized weakness 07/14/2019   Mild malnutrition ([place]) [id number]   Diastolic CHF, chronic ([place]) 03/25/2019   Vitamin D deficiency 03/25/2019   S/P excision of skin lesion, follow-up exam 12/26/2017  Impingement syndrome of left shoulder region 07/04/2017   Allergic rhinitis 12/31/2016   Osteoporosis of forearm 02/13/2016   Insomnia secondary to anxiety 07/12/2015   Anxiety disorder 07/12/2015   Essential (primary) hypertension 10/07/2014   Sinus bradycardia    Hardening of the aorta (main artery of the heart) ([place]) 06/26/2014   History of Clostridium difficile colitis 10/20/2013   History of pancreatitis 10/20/2013   Low back pain 10/04/2013   Do not resuscitate discussion 02/07/2013   Chest pain at rest 12/26/2011   COPD, moderate ([place]) 12/05/2011   Presence of aortocoronary bypass graft    Stage 3b chronic kidney disease ([place]) 09/25/2011   Hyperlipidemia    SOB (shortness of breath)    Coronary artery disease of  native artery of native heart with stable angina pectoris ([place])    Carotid artery disease ([place])     PCP: Deborra Medina MD  REFERRING PROVIDER: Deborra Medina MD  REFERRING DIAG: unsteadiness  Rationale for Evaluation and Treatment: Rehabilitation  THERAPY DIAG:  Muscle weakness (generalized)  Unsteadiness on feet  ONSET DATE: ~26month  SUBJECTIVE:                                                                                                                                                                                             SUBJECTIVE STATEMENT:  Pt reports she is feeling "extra wobbly" today. Generalized muscle weakness and unsteadiness on feet is ISQ. Pt states she has eaten and has been drinking water today. No falls since last session.   PERTINENT HISTORY: Pt is a 89y.o. female who presents with unsteadiness on feet that began about 4 months ago. Pt reports she started to feel "wobbly and unsteady". MD told pt she has generalized weakness and her response was "that's just what happens when you're this old". Unsteadiness on feet ISQ since onset. Ambulates independently without an AD. No falls in the last 6 months. Pt owns a SPC but does not use it. Pt lives alone in a 2-story house close to the clinic and her daughter visits very often. Her room is upstairs; she walks up ~10 steps holding onto the L handrail with both hands to get to her room. Pt drives independently with no issues. Pt remains independent with all ADLs including grocery shopping with the exception of big loads (her daughter helps her out with that), cleaning (she has help for that), cooking ("not because I can't but because I don't want to do it"). PMH: heart surgery in 1999, COPD, SOB intermittently. Pt denies N/V, B&B changes, unexplained weight fluctuation, saddle paresthesia, fever, night sweats, or unrelenting night pain  at this time. No falls in past 6 months.   Pt accompanied by: self   PAIN:  Are you  having pain? No  PRECAUTIONS: Other: Fall risk  WEIGHT BEARING RESTRICTIONS: No  FALLS: Has patient fallen in last 6 months? No  LIVING ENVIRONMENT: Lives with: lives with their family and lives alone Lives in: House/apartment Stairs: Yes: Internal: 10 steps; on left going up and External: 3-4 steps; can reach both, walkway to front door - 3-4 steps; can reach both Has following equipment at home: Single point cane  PLOF: Independent  PATIENT GOALS: Pt does not want to feel wobbly. Wants to feel more stable and prevent the risk of falls.  OBJECTIVE:   BP:  prior to 6MWT - 137/57; after 6MWT - 145/98 6MWT: 765 feet with minimal CGA Grip strength - L: 25.33 lbs R: 30.67 lbs   DIAGNOSTIC FINDINGS: N/A  COGNITION: Overall cognitive status: Within functional limits for tasks assessed   SENSATION: WFL  COORDINATION: Slowed coordination  EDEMA: N/A   MUSCLE TONE:  Decreased muscle tone bilaterally  MUSCLE LENGTH: Not assessed  DTRs:   Not assessed   POSTURE: No significant postural limitations  LOWER EXTREMITY ROM:     Active  Right Eval Left Eval  Hip flexion 102 102  Hip extension 120 120  Hip abduction    Hip adduction    Hip internal rotation    Hip external rotation    Knee flexion    Knee extension    Ankle dorsiflexion    Ankle plantarflexion    Ankle inversion    Ankle eversion     (Blank rows = not tested)  LOWER EXTREMITY MMT:    MMT Right Eval Left Eval  Hip flexion 3 3  Hip extension 3 3  Hip abduction 3 3  Hip adduction    Hip internal rotation    Hip external rotation    Knee flexion    Knee extension    Ankle dorsiflexion    Ankle plantarflexion    Ankle inversion    Ankle eversion    (Blank rows = not tested)  BED MOBILITY:  Sit to supine Complete Independence Supine to sit Complete Independence Pt uses walker for bed transfer   TRANSFERS: Assistive device utilized: None  Sit to stand: Modified independence Stand  to sit: Used bilateral UE to stand Floor: Complete Independence   STAIRS:  Deferred to next session  GAIT: Gait pattern: decreased step length- Left, decreased stride length, decreased hip/knee flexion- Right, decreased hip/knee flexion- Left, decreased ankle dorsiflexion- Right, decreased ankle dorsiflexion- Left, shuffling, narrow BOS, poor foot clearance- Right, and poor foot clearance- Left Distance walked: 10 meters Assistive device utilized: None Level of assistance: Complete Independence Comments: shuffling gait pattern  FUNCTIONAL TESTS:  5 times sit to stand: 18.09 secs (pt used arms to push off her quads to assist in transfer) Timed up and go (TUG): 15.44 secs 10 meter walk test: self-selected - 0.83 m/s; fastest - 1.0 m/s Dynamic Gait Index: 15/24  PATIENT SURVEYS:  FOTO 41  TODAY'S TREATMENT:  DATE: 10/25/22 Therex: Standing hip abduction with counter support x 8 (bilaterally) Standing marches 2x x 12 cuing for slow, controlled mvmt with good carry over  Stair training 3x with one finger on ascend/descent Standing mini squats BUE support (2 fingers) x 12 with VC/TC to keep knees behind toes and hips back with good carry over     PATIENT EDUCATION: Education details: Pt was educated on diagnosis, anatomy and pathology involved, prognosis, role of PT, and was given an HEP, demonstrating exercises with proper form following verbal and tactile cues, and was given a paper hand out to continue exercise at home. Pt was educated on and agreed to plan of care Person educated: Patient Education method: Explanation, Demonstration, Tactile cues, Verbal cues, and Handouts Education comprehension: verbalized understanding, returned demonstration, and verbal cues required  HOME EXERCISE PROGRAM: Standing marches - 1 x weekly - 2 sets - 8-12 reps Standing hip  abduction with counter support - 1 x weekly  HS stretch - 3 x 30 seconds 2x/day    ASSESSMENT:  CLINICAL IMPRESSION: PT initiated therex for bilateral hip strength, increased LLE motor control, increased balance, increased global muscular endurance, and proper gait mechanics with success. PT had pt complete 6MWT and grip strength test with success. PT monitored BP levels throughout session. Pt is able to complete therex with increased rest this session. Pt is able to comply with all cuing for proper form and technique of therex with great effort throughout session. Pt felt less wobbly at the end of session. Pt experienced muscular fatigue as the session went on. Pt would benefit from skilled PT to address the aforementioned impairments and improve overall QoL.   OBJECTIVE IMPAIRMENTS: Abnormal gait, decreased activity tolerance, decreased balance, decreased coordination, decreased endurance, decreased mobility, difficulty walking, decreased ROM, decreased strength, increased fascial restrictions, impaired flexibility, improper body mechanics, and postural dysfunction.   ACTIVITY LIMITATIONS: carrying, lifting, bending, squatting, stairs, transfers, bed mobility, dressing, and hygiene/grooming  PARTICIPATION LIMITATIONS: meal prep, cleaning, laundry, shopping, and community activity  PERSONAL FACTORS: Age, Education, Fitness, Past/current experiences, Time since onset of injury/illness/exacerbation, and 3+ comorbidities: COPD, CAD, OA  are also affecting patient's functional outcome.   REHAB POTENTIAL: Good  CLINICAL DECISION MAKING: Evolving/moderate complexity  EVALUATION COMPLEXITY: Moderate   GOALS: Goals reviewed with patient? Yes  SHORT TERM GOALS: Target date: 11/24/22  Pt will be independent with HEP in order to improve strength and balance in order to decrease fall risk and improve function at home and work. Baseline: 10/26/22 HEP given  Goal status: INITIAL    LONG TERM  GOALS: Target date: 01/08/23  Pt will demonstrate 10MWT speed of at least 1.0 m/s to demonstrate decreased fall risk and increased independence in community ambulation Baseline: Baseline: self-selected - 0.83 m/s; fastest - 0.91 m/s Goal status: INITIAL  2.  Pt will increase FOTO score to 57 to demonstrate predicted increase in functional mobility to complete ADLs Baseline: 41 Goal status: INITIAL  3.  Pt will ascend 10 steps only using LUE to hold onto the L handrail for increased independence to complete ADLs Baseline: Unsteadiness with 10 steps and requires both hands on L handrail Goal status: INITIAL  4.  Pt will demonstrate TUG of 13 secs or less in order to demonstrate decreased fall risk Baseline: 18.09 secs Goal status: INITIAL 5.  Pt will improve DGI by to least 19/24 in order to demonstrate clinically significant improvement in balance for decreased risk for falls  Baseline: 15/24 Goal status: INITIAL  6.  Pt will decrease 5TSTS to at least 13 seconds in order to demonstrate clinically significant improvement in LE strength to age matched norms, needed for increased independence with basic transfers  Baseline: 10/26/22 18.09 secs (pt used arms to push off her quads to assist in transfer) Goal status: INITIAL     PLAN:  PT FREQUENCY: 1-2x/week  PT DURATION: 8 weeks  PLANNED INTERVENTIONS: Therapeutic exercises, Therapeutic activity, Neuromuscular re-education, Balance training, Gait training, Patient/Family education, Self Care, Joint mobilization, Joint manipulation, Stair training, DME instructions, Aquatic Therapy, Dry Needling, Electrical stimulation, Spinal manipulation, Spinal mobilization, Cryotherapy, Moist heat, Traction, Ultrasound, Ionotophoresis '4mg'$ /ml Dexamethasone, Manual therapy, and Re-evaluation.  PLAN FOR NEXT SESSION: Update HEP. Test grip strength. Test stairs. Incorporate dynamic stabilization with ramps and curbs.   Stanford Scotland SPT  Durwin Reges DPT Durwin Reges, PT 11/11/2022, 9:53 AM

## 2022-11-15 ENCOUNTER — Encounter: Payer: No Typology Code available for payment source | Admitting: Physical Therapy

## 2022-11-16 ENCOUNTER — Encounter: Payer: Medicare Other | Admitting: Physical Therapy

## 2022-11-17 ENCOUNTER — Encounter: Payer: No Typology Code available for payment source | Admitting: Physical Therapy

## 2022-11-22 ENCOUNTER — Encounter: Payer: No Typology Code available for payment source | Admitting: Physical Therapy

## 2022-11-23 ENCOUNTER — Encounter: Payer: No Typology Code available for payment source | Admitting: Physical Therapy

## 2022-11-24 ENCOUNTER — Ambulatory Visit (INDEPENDENT_AMBULATORY_CARE_PROVIDER_SITE_OTHER): Payer: No Typology Code available for payment source | Admitting: Internal Medicine

## 2022-11-24 ENCOUNTER — Encounter: Payer: Self-pay | Admitting: Internal Medicine

## 2022-11-24 VITALS — BP 130/70 | HR 69 | Temp 98.1°F | Ht 63.0 in | Wt 123.8 lb

## 2022-11-24 DIAGNOSIS — I10 Essential (primary) hypertension: Secondary | ICD-10-CM

## 2022-11-24 DIAGNOSIS — M6281 Muscle weakness (generalized): Secondary | ICD-10-CM

## 2022-11-24 DIAGNOSIS — E782 Mixed hyperlipidemia: Secondary | ICD-10-CM

## 2022-11-24 DIAGNOSIS — E441 Mild protein-calorie malnutrition: Secondary | ICD-10-CM

## 2022-11-24 LAB — COMPREHENSIVE METABOLIC PANEL
ALT: 11 U/L (ref 0–35)
AST: 16 U/L (ref 0–37)
Albumin: 3.7 g/dL (ref 3.5–5.2)
Alkaline Phosphatase: 58 U/L (ref 39–117)
BUN: 21 mg/dL (ref 6–23)
CO2: 29 mEq/L (ref 19–32)
Calcium: 9 mg/dL (ref 8.4–10.5)
Chloride: 105 mEq/L (ref 96–112)
Creatinine, Ser: 1.56 mg/dL — ABNORMAL HIGH (ref 0.40–1.20)
GFR: 28.33 mL/min — ABNORMAL LOW (ref >60.00)
Glucose, Bld: 80 mg/dL (ref 70–99)
Potassium: 4.5 mEq/L (ref 3.5–5.1)
Sodium: 142 mEq/L (ref 135–145)
Total Bilirubin: 0.6 mg/dL (ref 0.2–1.2)
Total Protein: 6.1 g/dL (ref 6.0–8.3)

## 2022-11-24 NOTE — Assessment & Plan Note (Addendum)
Significantly Improved control on  Imdur 90 mg daily, amlodipine 2.5 mg daily  and losartan 100 mg daily.  DID NOT TOLERATE HIGHER DOSES OF AMLODIPINE .  Per cardiology will consider adding carvedilol or hydralazine (would prefer once daily medication given cognitive issues)

## 2022-11-24 NOTE — Patient Instructions (Addendum)
You should be taking PROBIOTIC every day because of your history of c dificile colitis   You take losartan amlodipine and Isosorbide mononitrate .  As long as your blood pressures are 120/70 or HIGHER,  CONTINUE THIS REGIMEN   IF MOST OF YOUR READINGS ARE BELOW 120/70 ,  SUSPEND THE AMLODIPINE   YOUR PHYSICAL THERAPY SCHEDULE IS VERY IMPORTANT ;  DO NOT MISS THEM FOR ME!  WE CAN RESCHEDULE YOUR DOCTOR'S APPTS .  I WANT YOU GOING TWO TIMES PER WEEK !  You should not be going up the stairs alone !

## 2022-11-24 NOTE — Assessment & Plan Note (Signed)
Secondary to deconditioning and aging.  Encouraged to increase PT two twice weekly.  Strongly advised her to change bedroom to downstairs to minimize risk of catstrophic fall

## 2022-11-24 NOTE — Progress Notes (Signed)
Subjective:  Patient ID: [name], female    DOB: 05-Jan-1926  Age: 89 y.o. MRN: [id number]  CC: The primary encounter diagnosis was Mixed hyperlipidemia. Diagnoses of Mild malnutrition ([place]), Generalized muscle weakness, and Essential (primary) hypertension were also pertinent to this visit.   HPI [name] presents for  Chief Complaint  Patient presents with   Medical Management of Chronic Issues    3 month follow up / dizzy   HTN , underweight, ckd, COPD:  1) HTN:  taking amlodipine and losartan.  Daughter has concerns about regimen and reports readings at home of    2) generalized weakness: has only received  PT    Outpatient Medications Prior to Visit  Medication Sig Dispense Refill   amLODipine (NORVASC) 2.5 MG tablet Take 1 tablet (2.5 mg total) by mouth daily. 90 tablet 1   atorvastatin (LIPITOR) 40 MG tablet TAKE 1 TABLET BY MOUTH DAILY 90 tablet 1   Bacillus Coagulans-Inulin (PROBIOTIC) 1-250 BILLION-MG CAPS Take 1 capsule by mouth daily. 30 capsule 0   clopidogrel (PLAVIX) 75 MG tablet Take 1 tablet (75 mg total) by mouth daily. 90 tablet 3   Cranberry 125 MG TABS Take 1 tablet by mouth daily.     furosemide (LASIX) 20 MG tablet TAKE 1 TABLET BY MOUTH NOT MORE THAN ONCE A WEEK AS NEEDED 12 tablet 3   Ipratropium-Albuterol (COMBIVENT RESPIMAT) 20-100 MCG/ACT AERS respimat INHALE ONE PUFF EVERY SIX HOURS 4 g 5   isosorbide mononitrate (IMDUR) 60 MG 24 hr tablet TAKE ONE AND ONE-HALF TABLET BY MOUTH TWICE DAILY 270 tablet 0   losartan (COZAAR) 100 MG tablet Take 1 tablet (100 mg total) by mouth daily. 90 tablet 3   magic mouthwash (lidocaine, diphenhydrAMINE, alum & mag hydroxide) suspension Swish and spit 5 mLs 4 (four) times daily as needed for mouth pain. 360 mL 1   mirtazapine (REMERON) 15 MG tablet Take 1 tablet (15 mg total) by mouth at bedtime. 90 tablet 1   nitroGLYCERIN (NITROSTAT) 0.4 MG SL tablet PLACE 1 TABLET UNDER TONGUE EVERY 5 MINUTES AS  NEEDED FOR CHEST PAIN. IF NO RELIEF IN 15 MINUTES CALL 911 ( MAXIMUM 3 TABLETS). 25 tablet 3   nystatin (MYCOSTATIN) 100000 UNIT/ML suspension Take 5 mLs (500,000 Units total) by mouth 4 (four) times daily. 60 mL 0   ondansetron (ZOFRAN) 4 MG tablet Take 1 tablet (4 mg total) by mouth every 8 (eight) hours as needed for nausea or vomiting. 40 tablet 3   pantoprazole (PROTONIX) 40 MG tablet Take 1 tablet (40 mg total) by mouth 2 (two) times daily before a meal. 180 tablet 3   Probiotic Product (PROBIOTIC PO) Take 1 capsule by mouth daily.     propranolol (INDERAL) 20 MG tablet TAKE ONE TABLET 3 TIMES DAILY AS NEEDED FOR TACHYCARDIA 270 tablet 3   ranolazine (RANEXA) 500 MG 12 hr tablet Take 1 tablet (500 mg total) by mouth 2 (two) times daily. 180 tablet 3   vitamin B-12 (CYANOCOBALAMIN) 500 MCG tablet Take 500 mcg by mouth daily.     No facility-administered medications prior to visit.    Review of Systems;  Patient denies headache, fevers, malaise, unintentional weight loss, skin rash, eye pain, sinus congestion and sinus pain, sore throat, dysphagia,  hemoptysis , cough, dyspnea, wheezing, chest pain, palpitations, orthopnea, edema, abdominal pain, nausea, melena, diarrhea, constipation, flank pain, dysuria, hematuria, urinary  Frequency, nocturia, numbness, tingling, seizures,  Focal weakness, Loss of consciousness,  Tremor, insomnia, depression, anxiety, and suicidal ideation.      Objective:  BP 130/70   Pulse 69   Temp 98.1 F (36.7 C) (Oral)   Ht '5\' 3"'$  (1.6 m)   Wt 123 lb 12.8 oz (56.2 kg)   SpO2 95%   BMI 21.93 kg/m   BP Readings from Last 3 Encounters:  11/24/22 130/70  08/24/22 (!) 182/76  07/19/22 (!) 160/70    Wt Readings from Last 3 Encounters:  11/24/22 123 lb 12.8 oz (56.2 kg)  08/24/22 119 lb 9.6 oz (54.3 kg)  07/19/22 121 lb 8 oz (55.1 kg)    Physical Exam Vitals reviewed.  Constitutional:      General: She is not in acute distress.    Appearance: Normal  appearance. She is normal weight. She is not ill-appearing, toxic-appearing or diaphoretic.  HENT:     Head: Normocephalic.  Eyes:     General: No scleral icterus.       Right eye: No discharge.        Left eye: No discharge.     Conjunctiva/sclera: Conjunctivae normal.  Cardiovascular:     Rate and Rhythm: Normal rate and regular rhythm.     Heart sounds: Normal heart sounds.  Pulmonary:     Effort: Pulmonary effort is normal. No respiratory distress.     Breath sounds: Normal breath sounds.  Musculoskeletal:        General: Normal range of motion.  Skin:    General: Skin is warm and dry.  Neurological:     General: No focal deficit present.     Mental Status: She is alert and oriented to person, place, and time. Mental status is at baseline.  Psychiatric:        Mood and Affect: Mood normal.        Behavior: Behavior normal.        Thought Content: Thought content normal.        Judgment: Judgment normal.    Lab Results  Component Value Date   HGBA1C 5.8 01/30/2017   HGBA1C 5.8 09/26/2016    Lab Results  Component Value Date   CREATININE 1.56 (H) 11/24/2022   CREATININE 1.31 (H) 05/14/2022   CREATININE 1.37 (H) 01/06/2022    Lab Results  Component Value Date   WBC 6.7 05/14/2022   HGB 12.5 05/14/2022   HCT 37.9 05/14/2022   PLT 186 05/14/2022   GLUCOSE 80 11/24/2022   CHOL 138 01/06/2022   TRIG 93.0 01/06/2022   HDL 59.50 01/06/2022   LDLDIRECT 72.0 11/09/2015   LDLCALC 60 01/06/2022   ALT 11 11/24/2022   AST 16 11/24/2022   NA 142 11/24/2022   K 4.5 11/24/2022   CL 105 11/24/2022   CREATININE 1.56 (H) 11/24/2022   BUN 21 11/24/2022   CO2 29 11/24/2022   TSH 5.39 01/06/2022   INR 0.9 01/31/2018   HGBA1C 5.8 01/30/2017   MICROALBUR 2.5 (H) 12/26/2011    DG Chest 2 View  Result Date: 05/14/2022 CLINICAL DATA:  Chest pain EXAM: CHEST - 2 VIEW COMPARISON:  11/18/2021 FINDINGS: Cardiac shadow is enlarged but stable. Postsurgical changes are again  seen. Lungs are clear bilaterally. No acute bony abnormality is seen. IMPRESSION: No acute abnormality noted. Electronically Signed   By: Inez Catalina M.D.   On: 05/14/2022 03:12    Assessment & Plan:  .Mixed hyperlipidemia -     Comprehensive metabolic panel  Mild malnutrition (HCC) Assessment & Plan: Weight is stable;  continue remeron    Generalized muscle weakness Assessment & Plan: Secondary to deconditioning and aging.  Encouraged to increase PT two twice weekly.  Strongly advised her to change bedroom to downstairs to minimize risk of catstrophic fall    Essential (primary) hypertension Assessment & Plan: Significantly Improved control on  Imdur 90 mg daily, amlodipine 2.5 mg daily  and losartan 100 mg daily.  DID NOT TOLERATE HIGHER DOSES OF AMLODIPINE .  Per cardiology will consider adding carvedilol or hydralazine (would prefer once daily medication given cognitive issues)      I provided 30 minutes of face-to-face time during this encounter reviewing patient's last visit with me, patient's  most recent visit with PHYSICAL THERAPY., CARDIOLOGY,  previous  labs and imaging studies, counseling on currently addressed issues,  and post visit ordering to diagnostics and therapeutics .   Follow-up: Return in about 3 months (around 02/24/2023).   [name], MD

## 2022-11-24 NOTE — Assessment & Plan Note (Signed)
Weight is stable;  continue remeron

## 2022-11-29 ENCOUNTER — Encounter: Payer: No Typology Code available for payment source | Admitting: Physical Therapy

## 2022-11-30 ENCOUNTER — Encounter: Payer: Medicare Other | Admitting: Physical Therapy

## 2022-12-01 ENCOUNTER — Ambulatory Visit: Payer: No Typology Code available for payment source | Attending: Internal Medicine | Admitting: Physical Therapy

## 2022-12-01 ENCOUNTER — Encounter: Payer: Self-pay | Admitting: Physical Therapy

## 2022-12-01 DIAGNOSIS — M6281 Muscle weakness (generalized): Secondary | ICD-10-CM | POA: Insufficient documentation

## 2022-12-01 DIAGNOSIS — R2681 Unsteadiness on feet: Secondary | ICD-10-CM | POA: Diagnosis not present

## 2022-12-01 DIAGNOSIS — R531 Weakness: Secondary | ICD-10-CM | POA: Diagnosis not present

## 2022-12-01 NOTE — Therapy (Signed)
OUTPATIENT PHYSICAL THERAPY THORACOLUMBAR TREATMENT   Patient Name: [name] MRN: [id number] DOB:[birth year], 89 y.o., female Today's Date: 12/01/2022  END OF SESSION:  PT End of Session - 12/01/22 1350     Visit Number 3    Number of Visits 17    Date for PT Re-Evaluation 01/17/23    Authorization - Visit Number 3    Authorization - Number of Visits 17    Progress Note Due on Visit 10    PT Start Time 1350    PT Stop Time 1430    PT Time Calculation (min) 40 min    Activity Tolerance Patient tolerated treatment well    Behavior During Therapy WFL for tasks assessed/performed               Past Medical History:  Diagnosis Date   C. difficile colitis    CAD (coronary artery disease)    a. s/p CABG 1999; b. [facility] 10/11, no scar or ischemia, EF 68%; b. [place] 06/18/14: no evidence of infarction or ischemia, EF 77%, low risk study; c. 02/2019 MV: EF 56%, ? mild lat ischemia->low risk.   Carotid artery disease ([place])    a. Doppler January, 2012, 40-59% bilateral; b. 02/2017 U/S: 1-39% bilat ICA stenosis.   Closed fracture pubis ([place]) 07/04/2017   COPD (chronic obstructive pulmonary disease) (HCC)    Diffuse cystic mastopathy    Diverticulitis    last colonoscopy  incomplete Jan 2011   Dizziness    stable now (Oct 2011) patient tells me question of TIA, we will obtain records   Gait abnormality    Patient complains of "wobbly gait", December, 2013   GERD (gastroesophageal reflux disease)    History of migraines    Hx of CABG    a. 3 vessel in 1999   Hyperlipidemia    Hypertension    Indigestion    3 weeks, October 2011   Kidney stones    Rheumatic fever    SCC (squamous cell carcinoma), face 08/01/2016   Sinus bradycardia    Mild, December, 2013   SOB (shortness of breath)    Syncopal episodes    after 18 holes of golf and increase hydrochlorothiazide, resolved    Past Surgical History:  Procedure Laterality Date   APPENDECTOMY  1950    BREAST BIOPSY Right 1994   BREAST CYST ASPIRATION     multiple BIL   CATARACT EXTRACTION Right 2009   CATARACT EXTRACTION Left 2011   COLONOSCOPY  [id number]   Dr. 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   Oral thrush 11/22/2021   Mild cognitive impairment with memory loss 10/12/2021   Hyperparathyroidism due to renal insufficiency ([place]) 02/02/2021   CKD (chronic kidney disease) stage 3, GFR 30-59 ml/min ([place]) 01/15/2021   Grief 04/08/2020   History of esophageal stricture 04/08/2020   Leg swelling 03/09/2020   Atypical chest pain 01/10/2020   B12 deficiency 01/09/2020   Generalized weakness 07/14/2019  Mild malnutrition ([place]) [id number]   Diastolic CHF, chronic ([place]) 03/25/2019   Vitamin D deficiency 03/25/2019   S/P excision of skin lesion, follow-up exam 12/26/2017   Impingement syndrome of left shoulder region 07/04/2017   Allergic rhinitis 12/31/2016   Osteoporosis of forearm 02/13/2016   Insomnia secondary to anxiety 07/12/2015   Anxiety disorder 07/12/2015   Essential (primary) hypertension 10/07/2014   Sinus bradycardia    Hardening of the aorta (main artery of the heart) ([place]) 06/26/2014   History of Clostridium difficile colitis 10/20/2013   History of pancreatitis 10/20/2013   Low back pain 10/04/2013   Do not resuscitate discussion 02/07/2013   Chest pain at rest 12/26/2011   COPD, moderate ([place]) 12/05/2011   Presence of aortocoronary bypass graft    Stage 3b chronic kidney  disease ([place]) 09/25/2011   Hyperlipidemia    SOB (shortness of breath)    Coronary artery disease of native artery of native heart with stable angina pectoris ([place])    Carotid artery disease ([place])     PCP: Deborra Medina MD  REFERRING PROVIDER: Deborra Medina MD  REFERRING DIAG: unsteadiness  Rationale for Evaluation and Treatment: Rehabilitation  THERAPY DIAG:  Muscle weakness (generalized)  Unsteadiness on feet  Generalized weakness  ONSET DATE: ~36month  SUBJECTIVE:                                                                                                                                                                                             SUBJECTIVE STATEMENT:  Pt reports she is feeling wobbly but is not in any pay. She reports she uses uses a cane occasionally, but maybe should use it more.    PERTINENT HISTORY: Pt is a 89y.o. female who presents with unsteadiness on feet that began about 4 months ago. Pt reports she started to feel "wobbly and unsteady". MD told pt she has generalized weakness and her response was "that's just what happens when you're this old". Unsteadiness on feet ISQ since onset. Ambulates independently without an AD. No falls in the last 6 months. Pt owns a SPC but does not use it. Pt lives alone in a 2-story house close to the clinic and her daughter visits very often. Her room is upstairs; she walks up ~10 steps holding onto the L handrail with both hands to get to her room. Pt drives independently with no issues. Pt remains independent with all ADLs including grocery shopping with the exception of big loads (her daughter helps her out with that), cleaning (she has help for that), cooking ("not because I can't but because I don't want to do  it"). PMH: heart surgery in 1999, COPD, SOB intermittently. Pt denies N/V, B&B changes, unexplained weight fluctuation, saddle paresthesia, fever, night sweats, or unrelenting night pain at this time. No falls in  past 6 months.   Pt accompanied by: self   PAIN:  Are you having pain? No  PRECAUTIONS: Other: Fall risk  WEIGHT BEARING RESTRICTIONS: No  FALLS: Has patient fallen in last 6 months? No  LIVING ENVIRONMENT: Lives with: lives with their family and lives alone Lives in: House/apartment Stairs: Yes: Internal: 10 steps; on left going up and External: 3-4 steps; can reach both, walkway to front door - 3-4 steps; can reach both Has following equipment at home: Single point cane  PLOF: Independent  PATIENT GOALS: Pt does not want to feel wobbly. Wants to feel more stable and prevent the risk of falls.  OBJECTIVE:   BP:  prior to 6MWT - 137/57; after 6MWT - 145/98 6MWT: 765 feet with minimal CGA Grip strength - L: 25.33 lbs R: 30.67 lbs   DIAGNOSTIC FINDINGS: N/A  COGNITION: Overall cognitive status: Within functional limits for tasks assessed   SENSATION: WFL  COORDINATION: Slowed coordination  EDEMA: N/A   MUSCLE TONE:  Decreased muscle tone bilaterally  MUSCLE LENGTH: Not assessed  DTRs:   Not assessed   POSTURE: No significant postural limitations  LOWER EXTREMITY ROM:     Active  Right Eval Left Eval  Hip flexion 102 102  Hip extension 120 120  Hip abduction    Hip adduction    Hip internal rotation    Hip external rotation    Knee flexion    Knee extension    Ankle dorsiflexion    Ankle plantarflexion    Ankle inversion    Ankle eversion     (Blank rows = not tested)  LOWER EXTREMITY MMT:    MMT Right Eval Left Eval  Hip flexion 3 3  Hip extension 3 3  Hip abduction 3 3  Hip adduction    Hip internal rotation    Hip external rotation    Knee flexion    Knee extension    Ankle dorsiflexion    Ankle plantarflexion    Ankle inversion    Ankle eversion    (Blank rows = not tested)  BED MOBILITY:  Sit to supine Complete Independence Supine to sit Complete Independence Pt uses walker for bed transfer   TRANSFERS: Assistive  device utilized: None  Sit to stand: Modified independence Stand to sit: Used bilateral UE to stand Floor: Complete Independence   STAIRS:  Deferred to next session  GAIT: Gait pattern: decreased step length- Left, decreased stride length, decreased hip/knee flexion- Right, decreased hip/knee flexion- Left, decreased ankle dorsiflexion- Right, decreased ankle dorsiflexion- Left, shuffling, narrow BOS, poor foot clearance- Right, and poor foot clearance- Left Distance walked: 10 meters Assistive device utilized: None Level of assistance: Complete Independence Comments: shuffling gait pattern  FUNCTIONAL TESTS:  5 times sit to stand: 18.09 secs (pt used arms to push off her quads to assist in transfer) Timed up and go (TUG): 15.44 secs 10 meter walk test: self-selected - 0.83 m/s; fastest - 1.0 m/s Dynamic Gait Index: 15/24  PATIENT SURVEYS:  FOTO 41  TODAY'S TREATMENT:  DATE: 10/25/22 Therex: Nustep seat 6 UE 12 L3 31mns cuing for SPM in 50s with decent carry over STS from elevated mat table 2x 10 with good carry over of cuing for proper technique Lateral stepping 20 ft L and R with SBA for safety with good carry over of cuing for foot clearance Alt heel taps on 6in cone 2x 12 with 245fger support with SBA for safety Standing hip abduction with counter support x 8 (bilaterally) Standing ball toss/catch with PT throwing ball slightly outside BOS with chair behind patient for safety 2x 15 throws     PATIENT EDUCATION: Education details: Pt was educated on diagnosis, anatomy and pathology involved, prognosis, role of PT, and was given an HEP, demonstrating exercises with proper form following verbal and tactile cues, and was given a paper hand out to continue exercise at home. Pt was educated on and agreed to plan of care Person educated: Patient Education  method: Explanation, Demonstration, Tactile cues, Verbal cues, and Handouts Education comprehension: verbalized understanding, returned demonstration, and verbal cues required  HOME EXERCISE PROGRAM: Standing marches - 1 x weekly - 2 sets - 8-12 reps Standing hip abduction with counter support - 1 x weekly  HS stretch - 3 x 30 seconds 2x/day    ASSESSMENT:  CLINICAL IMPRESSION: PT initiated therex for bilateral hip strength, increased LLE motor control, increased balance, increased global muscular endurance, and proper gait mechanics with success. Pt is able to comply with all cuing for proper technique of therex with no pain throughout session. Some SBA needed for safety, no evidence of near LOB throughout session, occassional stepping strategy. Pt would benefit from skilled PT to address the aforementioned impairments and improve overall QoL.   OBJECTIVE IMPAIRMENTS: Abnormal gait, decreased activity tolerance, decreased balance, decreased coordination, decreased endurance, decreased mobility, difficulty walking, decreased ROM, decreased strength, increased fascial restrictions, impaired flexibility, improper body mechanics, and postural dysfunction.   ACTIVITY LIMITATIONS: carrying, lifting, bending, squatting, stairs, transfers, bed mobility, dressing, and hygiene/grooming  PARTICIPATION LIMITATIONS: meal prep, cleaning, laundry, shopping, and community activity  PERSONAL FACTORS: Age, Education, Fitness, Past/current experiences, Time since onset of injury/illness/exacerbation, and 3+ comorbidities: COPD, CAD, OA  are also affecting patient's functional outcome.   REHAB POTENTIAL: Good  CLINICAL DECISION MAKING: Evolving/moderate complexity  EVALUATION COMPLEXITY: Moderate   GOALS: Goals reviewed with patient? Yes  SHORT TERM GOALS: Target date: 11/24/22  Pt will be independent with HEP in order to improve strength and balance in order to decrease fall risk and improve function  at home and work. Baseline: 10/26/22 HEP given  Goal status: INITIAL    LONG TERM GOALS: Target date: 01/08/23  Pt will demonstrate 10MWT speed of at least 1.0 m/s to demonstrate decreased fall risk and increased independence in community ambulation Baseline: Baseline: self-selected - 0.83 m/s; fastest - 0.91 m/s Goal status: INITIAL  2.  Pt will increase FOTO score to 57 to demonstrate predicted increase in functional mobility to complete ADLs Baseline: 41 Goal status: INITIAL  3.  Pt will ascend 10 steps only using LUE to hold onto the L handrail for increased independence to complete ADLs Baseline: Unsteadiness with 10 steps and requires both hands on L handrail Goal status: INITIAL  4.  Pt will demonstrate TUG of 13 secs or less in order to demonstrate decreased fall risk Baseline: 18.09 secs Goal status: INITIAL 5.  Pt will improve DGI by to least 19/24 in order to demonstrate clinically significant improvement in balance for decreased risk for  falls  Baseline: 15/24 Goal status: INITIAL 6.  Pt will decrease 5TSTS to at least 13 seconds in order to demonstrate clinically significant improvement in LE strength to age matched norms, needed for increased independence with basic transfers  Baseline: 10/26/22 18.09 secs (pt used arms to push off her quads to assist in transfer) Goal status: INITIAL     PLAN:  PT FREQUENCY: 1-2x/week  PT DURATION: 8 weeks  PLANNED INTERVENTIONS: Therapeutic exercises, Therapeutic activity, Neuromuscular re-education, Balance training, Gait training, Patient/Family education, Self Care, Joint mobilization, Joint manipulation, Stair training, DME instructions, Aquatic Therapy, Dry Needling, Electrical stimulation, Spinal manipulation, Spinal mobilization, Cryotherapy, Moist heat, Traction, Ultrasound, Ionotophoresis '4mg'$ /ml Dexamethasone, Manual therapy, and Re-evaluation.  PLAN FOR NEXT SESSION: Update HEP. Test grip strength. Test stairs.  Incorporate dynamic stabilization with ramps and curbs.   Stanford Scotland SPT  Durwin Reges DPT Durwin Reges, PT 12/01/2022, 2:29 PM

## 2022-12-06 ENCOUNTER — Encounter: Payer: No Typology Code available for payment source | Admitting: Physical Therapy

## 2022-12-08 ENCOUNTER — Ambulatory Visit: Payer: No Typology Code available for payment source | Admitting: Physical Therapy

## 2022-12-08 ENCOUNTER — Encounter: Payer: Self-pay | Admitting: Physical Therapy

## 2022-12-08 DIAGNOSIS — R2681 Unsteadiness on feet: Secondary | ICD-10-CM

## 2022-12-08 DIAGNOSIS — M6281 Muscle weakness (generalized): Secondary | ICD-10-CM

## 2022-12-08 NOTE — Therapy (Signed)
OUTPATIENT PHYSICAL THERAPY THORACOLUMBAR TREATMENT   Patient Name: [name] MRN: [id number] DOB:03-17-1926, 89 y.o., female Today's Date: 12/08/2022  END OF SESSION:  PT End of Session - 12/08/22 1307     Visit Number 4    Number of Visits 17    Date for PT Re-Evaluation 01/17/23    Authorization - Visit Number 4    Authorization - Number of Visits 17    Progress Note Due on Visit 10    PT Start Time 1303    PT Stop Time 1342    PT Time Calculation (min) 39 min    Activity Tolerance Patient tolerated treatment well    Behavior During Therapy WFL for tasks assessed/performed                Past Medical History:  Diagnosis Date   C. difficile colitis    CAD (coronary artery disease)    a. s/p CABG 1999; b. [facility] 10/11, no scar or ischemia, EF 68%; b. [place] 06/18/14: no evidence of infarction or ischemia, EF 77%, low risk study; c. 02/2019 [place]: EF 56%, ? mild lat ischemia->low risk.   Carotid artery disease ([place])    a. Doppler January, 2012, 40-59% bilateral; b. 02/2017 U/[place]: 1-39% bilat ICA stenosis.   Closed fracture pubis ([place]) 07/04/2017   COPD (chronic obstructive pulmonary disease) (HCC)    Diffuse cystic mastopathy    Diverticulitis    last colonoscopy  incomplete Jan 2011   Dizziness    stable now (Oct 2011) patient tells me question of TIA, we will obtain records   Gait abnormality    Patient complains of "wobbly gait", December, 2013   GERD (gastroesophageal reflux disease)    History of migraines    Hx of CABG    a. 3 vessel in 1999   Hyperlipidemia    Hypertension    Indigestion    3 weeks, October 2011   Kidney stones    Rheumatic fever    SCC (squamous cell carcinoma), face 08/01/2016   Sinus bradycardia    Mild, December, 2013   SOB (shortness of breath)    Syncopal episodes    after 18 holes of golf and increase hydrochlorothiazide, resolved    Past Surgical History:  Procedure Laterality Date   APPENDECTOMY  1950    BREAST BIOPSY Right 1994   BREAST CYST ASPIRATION     multiple BIL   CATARACT EXTRACTION Right 2009   CATARACT EXTRACTION Left 2011   COLONOSCOPY  [id number]   Dr. 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   Oral thrush 11/22/2021   Mild cognitive impairment with memory loss 10/12/2021   Hyperparathyroidism due to renal insufficiency ([place]) 02/02/2021   CKD (chronic kidney disease) stage 3, GFR 30-59 ml/min ([place]) 01/15/2021   Grief 04/08/2020   History of esophageal stricture 04/08/2020   Leg swelling 03/09/2020   Atypical chest pain 01/10/2020   B12 deficiency 01/09/2020   Generalized weakness 07/14/2019  Mild malnutrition ([place]) [id number]   Diastolic CHF, chronic ([place]) 03/25/2019   Vitamin D deficiency 03/25/2019   S/P excision of skin lesion, follow-up exam 12/26/2017   Impingement syndrome of left shoulder region 07/04/2017   Allergic rhinitis 12/31/2016   Osteoporosis of forearm 02/13/2016   Insomnia secondary to anxiety 07/12/2015   Anxiety disorder 07/12/2015   Essential (primary) hypertension 10/07/2014   Sinus bradycardia    Hardening of the aorta (main artery of the heart) ([place]) 06/26/2014   History of Clostridium difficile colitis 10/20/2013   History of pancreatitis 10/20/2013   Low back pain 10/04/2013   Do not resuscitate discussion 02/07/2013   Chest pain at rest 12/26/2011   COPD, moderate ([place]) 12/05/2011   Presence of aortocoronary bypass graft    Stage 3b chronic kidney  disease ([place]) 09/25/2011   Hyperlipidemia    SOB (shortness of breath)    Coronary artery disease of native artery of native heart with stable angina pectoris ([place])    Carotid artery disease ([place])     PCP: Deborra Medina MD  REFERRING PROVIDER: Deborra Medina MD  REFERRING DIAG: unsteadiness  Rationale for Evaluation and Treatment: Rehabilitation  THERAPY DIAG:  Unsteadiness on feet  Muscle weakness (generalized)  ONSET DATE: ~34months  SUBJECTIVE:                                                                                                                                                                                             SUBJECTIVE STATEMENT:  Pt reports she is feeling wobbly but is not in any pain."I just feel wobbly with all walking". No falls since last visit, reports her children are helping her move her bedroom downstairs in her home.   PERTINENT HISTORY: Pt is a 89 y.o. female who presents with unsteadiness on feet that began about 4 months ago. Pt reports she started to feel "wobbly and unsteady". MD told pt she has generalized weakness and her response was "that's just what happens when you're this old". Unsteadiness on feet ISQ since onset. Ambulates independently without an AD. No falls in the last 6 months. Pt owns a SPC but does not use it. Pt lives alone in a 2-story house close to the clinic and her daughter visits very often. Her room is upstairs; she walks up ~10 steps holding onto the L handrail with both hands to get to her room. Pt drives independently with no issues. Pt remains independent with all ADLs including grocery shopping with the exception of big loads (her daughter helps her out with that), cleaning (she has help for that), cooking ("not because I can't but  because I don't want to do it"). PMH: heart surgery in 1999, COPD, SOB intermittently. Pt denies N/V, B&B changes, unexplained weight fluctuation, saddle paresthesia, fever, night sweats, or  unrelenting night pain at this time. No falls in past 6 months.   Pt accompanied by: self   PAIN:  Are you having pain? No  PRECAUTIONS: Other: Fall risk  WEIGHT BEARING RESTRICTIONS: No  FALLS: Has patient fallen in last 6 months? No  LIVING ENVIRONMENT: Lives with: lives with their family and lives alone Lives in: House/apartment Stairs: Yes: Internal: 10 steps; on left going up and External: 3-4 steps; can reach both, walkway to front door - 3-4 steps; can reach both Has following equipment at home: Single point cane  PLOF: Independent  PATIENT GOALS: Pt does not want to feel wobbly. Wants to feel more stable and prevent the risk of falls.  OBJECTIVE:   BP:  prior to 6MWT - 137/57; after 6MWT - 145/98 6MWT: 765 feet with minimal CGA Grip strength - L: 25.33 lbs R: 30.67 lbs   DIAGNOSTIC FINDINGS: N/A  COGNITION: Overall cognitive status: Within functional limits for tasks assessed   SENSATION: WFL  COORDINATION: Slowed coordination  EDEMA: N/A   MUSCLE TONE:  Decreased muscle tone bilaterally  MUSCLE LENGTH: Not assessed  DTRs:   Not assessed   POSTURE: No significant postural limitations  LOWER EXTREMITY ROM:     Active  Right Eval Left Eval  Hip flexion 102 102  Hip extension 120 120  Hip abduction    Hip adduction    Hip internal rotation    Hip external rotation    Knee flexion    Knee extension    Ankle dorsiflexion    Ankle plantarflexion    Ankle inversion    Ankle eversion     (Blank rows = not tested)  LOWER EXTREMITY MMT:    MMT Right Eval Left Eval  Hip flexion 3 3  Hip extension 3 3  Hip abduction 3 3  Hip adduction    Hip internal rotation    Hip external rotation    Knee flexion    Knee extension    Ankle dorsiflexion    Ankle plantarflexion    Ankle inversion    Ankle eversion    (Blank rows = not tested)  BED MOBILITY:  Sit to supine Complete Independence Supine to sit Complete Independence Pt uses  walker for bed transfer   TRANSFERS: Assistive device utilized: None  Sit to stand: Modified independence Stand to sit: Used bilateral UE to stand Floor: Complete Independence   STAIRS:  Deferred to next session  GAIT: Gait pattern: decreased step length- Left, decreased stride length, decreased hip/knee flexion- Right, decreased hip/knee flexion- Left, decreased ankle dorsiflexion- Right, decreased ankle dorsiflexion- Left, shuffling, narrow BOS, poor foot clearance- Right, and poor foot clearance- Left Distance walked: 10 meters Assistive device utilized: None Level of assistance: Complete Independence Comments: shuffling gait pattern  FUNCTIONAL TESTS:  5 times sit to stand: 18.09 secs (pt used arms to push off her quads to assist in transfer) Timed up and go (TUG): 15.44 secs 10 meter walk test: self-selected - 0.83 m/s; fastest - 1.0 m/s Dynamic Gait Index: 15/24  PATIENT SURVEYS:  FOTO 41  TODAY'S TREATMENT:  DATE: 10/25/22 Therex: Nustep seat 6 UE 12 L3 54mins cuing for SPM in 50s with decent carry over STS from elevated mat table with pink tball overhead 2x 10 with min cuing for full ext with stand with decent carry over BP reading (following rest from exercise) 148/57 Ambulation 357ft with speed changes called out at random SBA for safety  Blood pressure following rest: 123/58 Amb 278ft with 2# DB in pillow case in one hand, and 5# DB in pillow case in the other with speed changes called out at random; CGA for safety Stair amb w/ unilateral handrail x1 trial with reciprocal gait SBA; without handrail x2 trials (4 steps) with a few steps reciprocal, having to do a couple steps step to to maintain balance     PATIENT EDUCATION: Education details: Pt was educated on diagnosis, anatomy and pathology involved, prognosis, role of PT, and was given an  HEP, demonstrating exercises with proper form following verbal and tactile cues, and was given a paper hand out to continue exercise at home. Pt was educated on and agreed to plan of care Person educated: Patient Education method: Explanation, Demonstration, Tactile cues, Verbal cues, and Handouts Education comprehension: verbalized understanding, returned demonstration, and verbal cues required  HOME EXERCISE PROGRAM: Standing marches - 1 x weekly - 2 sets - 8-12 reps Standing hip abduction with counter support - 1 x weekly  HS stretch - 3 x 30 seconds 2x/day    ASSESSMENT:  CLINICAL IMPRESSION: PT initiated therex for bilateral hip strength, increased LLE motor control, increased balance, increased global muscular endurance, and proper gait mechanics with success. Pt is able to comply with all cuing for proper technique of therex with no pain throughout session. Some SBA needed for safety, no evidence of near LOB throughout session, occassional stepping strategy. Pt would benefit from skilled PT to address the aforementioned impairments and improve overall QoL.   OBJECTIVE IMPAIRMENTS: Abnormal gait, decreased activity tolerance, decreased balance, decreased coordination, decreased endurance, decreased mobility, difficulty walking, decreased ROM, decreased strength, increased fascial restrictions, impaired flexibility, improper body mechanics, and postural dysfunction.   ACTIVITY LIMITATIONS: carrying, lifting, bending, squatting, stairs, transfers, bed mobility, dressing, and hygiene/grooming  PARTICIPATION LIMITATIONS: meal prep, cleaning, laundry, shopping, and community activity  PERSONAL FACTORS: Age, Education, Fitness, Past/current experiences, Time since onset of injury/illness/exacerbation, and 3+ comorbidities: COPD, CAD, OA  are also affecting patient's functional outcome.   REHAB POTENTIAL: Good  CLINICAL DECISION MAKING: Evolving/moderate complexity  EVALUATION COMPLEXITY:  Moderate   GOALS: Goals reviewed with patient? Yes  SHORT TERM GOALS: Target date: 11/24/22  Pt will be independent with HEP in order to improve strength and balance in order to decrease fall risk and improve function at home and work. Baseline: 10/26/22 HEP given  Goal status: INITIAL    LONG TERM GOALS: Target date: 01/08/23  Pt will demonstrate 10MWT speed of at least 1.0 m/s to demonstrate decreased fall risk and increased independence in community ambulation Baseline: Baseline: self-selected - 0.83 m/s; fastest - 0.91 m/s Goal status: INITIAL  2.  Pt will increase FOTO score to 57 to demonstrate predicted increase in functional mobility to complete ADLs Baseline: 41 Goal status: INITIAL  3.  Pt will ascend 10 steps only using LUE to hold onto the L handrail for increased independence to complete ADLs Baseline: Unsteadiness with 10 steps and requires both hands on L handrail Goal status: INITIAL  4.  Pt will demonstrate TUG of 13 secs or less in order to demonstrate  decreased fall risk Baseline: 18.09 secs Goal status: INITIAL 5.  Pt will improve DGI by to least 19/24 in order to demonstrate clinically significant improvement in balance for decreased risk for falls  Baseline: 15/24 Goal status: INITIAL 6.  Pt will decrease 5TSTS to at least 13 seconds in order to demonstrate clinically significant improvement in LE strength to age matched norms, needed for increased independence with basic transfers  Baseline: 10/26/22 18.09 secs (pt used arms to push off her quads to assist in transfer) Goal status: INITIAL     PLAN:  PT FREQUENCY: 1-2x/week  PT DURATION: 8 weeks  PLANNED INTERVENTIONS: Therapeutic exercises, Therapeutic activity, Neuromuscular re-education, Balance training, Gait training, Patient/Family education, Self Care, Joint mobilization, Joint manipulation, Stair training, DME instructions, Aquatic Therapy, Dry Needling, Electrical stimulation, Spinal  manipulation, Spinal mobilization, Cryotherapy, Moist heat, Traction, Ultrasound, Ionotophoresis 4mg /ml Dexamethasone, Manual therapy, and Re-evaluation.  PLAN FOR NEXT SESSION: Update HEP. Test grip strength. Test stairs. Incorporate dynamic stabilization with ramps and curbs.   Stanford Scotland SPT  Durwin Reges DPT Durwin Reges, PT 12/08/2022, 2:12 PM

## 2022-12-09 ENCOUNTER — Other Ambulatory Visit: Payer: Self-pay | Admitting: Internal Medicine

## 2022-12-09 ENCOUNTER — Other Ambulatory Visit: Payer: Self-pay | Admitting: Cardiovascular Disease

## 2022-12-13 ENCOUNTER — Encounter: Payer: No Typology Code available for payment source | Admitting: Physical Therapy

## 2022-12-15 ENCOUNTER — Ambulatory Visit: Payer: No Typology Code available for payment source | Admitting: Physical Therapy

## 2022-12-20 ENCOUNTER — Ambulatory Visit: Payer: No Typology Code available for payment source | Attending: Internal Medicine | Admitting: Physical Therapy

## 2022-12-20 ENCOUNTER — Encounter: Payer: Self-pay | Admitting: Physical Therapy

## 2022-12-20 DIAGNOSIS — R262 Difficulty in walking, not elsewhere classified: Secondary | ICD-10-CM | POA: Diagnosis present

## 2022-12-20 DIAGNOSIS — R278 Other lack of coordination: Secondary | ICD-10-CM | POA: Insufficient documentation

## 2022-12-20 DIAGNOSIS — R2681 Unsteadiness on feet: Secondary | ICD-10-CM | POA: Insufficient documentation

## 2022-12-20 DIAGNOSIS — R269 Unspecified abnormalities of gait and mobility: Secondary | ICD-10-CM | POA: Diagnosis present

## 2022-12-20 DIAGNOSIS — R2689 Other abnormalities of gait and mobility: Secondary | ICD-10-CM | POA: Diagnosis present

## 2022-12-20 DIAGNOSIS — M6281 Muscle weakness (generalized): Secondary | ICD-10-CM | POA: Diagnosis not present

## 2022-12-20 DIAGNOSIS — R531 Weakness: Secondary | ICD-10-CM | POA: Diagnosis present

## 2022-12-20 NOTE — Therapy (Signed)
OUTPATIENT PHYSICAL THERAPY THORACOLUMBAR TREATMENT   Patient Name: [name] MRN: [id number] DOB:[birth year], 89 y.o., female Today's Date: 12/20/2022  END OF SESSION:  PT End of Session - 12/20/22 1119     Visit Number 5    Number of Visits 17    Date for PT Re-Evaluation 01/17/23    Authorization - Visit Number 5    Authorization - Number of Visits 17    Progress Note Due on Visit 10    PT Start Time 1125    PT Stop Time 1204    PT Time Calculation (min) 39 min    Activity Tolerance Patient tolerated treatment well    Behavior During Therapy WFL for tasks assessed/performed                 Past Medical History:  Diagnosis Date   C. difficile colitis    CAD (coronary artery disease)    a. s/p CABG 1999; b. Nuclear 10/11, no scar or ischemia, EF 68%; b. [place] 06/18/14: no evidence of infarction or ischemia, EF 77%, low risk study; c. 02/2019 MV: EF 56%, ? mild lat ischemia->low risk.   Carotid artery disease    a. Doppler January, 2012, 40-59% bilateral; b. 02/2017 U/S: 1-39% bilat ICA stenosis.   Closed fracture pubis 07/04/2017   COPD (chronic obstructive pulmonary disease)    Diffuse cystic mastopathy    Diverticulitis    last colonoscopy  incomplete Jan 2011   Dizziness    stable now (Oct 2011) patient tells me question of TIA, we will obtain records   Gait abnormality    Patient complains of "wobbly gait", December, 2013   GERD (gastroesophageal reflux disease)    History of migraines    Hx of CABG    a. 3 vessel in 1999   Hyperlipidemia    Hypertension    Indigestion    3 weeks, October 2011   Kidney stones    Rheumatic fever    SCC (squamous cell carcinoma), face 08/01/2016   Sinus bradycardia    Mild, December, 2013   SOB (shortness of breath)    Syncopal episodes    after 18 holes of golf and increase hydrochlorothiazide, resolved    Past Surgical History:  Procedure Laterality Date   APPENDECTOMY  1950   BREAST BIOPSY  Right 1994   BREAST CYST ASPIRATION     multiple BIL   CATARACT EXTRACTION Right 2009   CATARACT EXTRACTION Left 2011   COLONOSCOPY  [id number]   Dr. 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   Oral thrush 11/22/2021   Mild cognitive impairment with memory loss 10/12/2021   Hyperparathyroidism due to renal insufficiency 02/02/2021   CKD (chronic kidney disease) stage 3, GFR 30-59 ml/min 01/15/2021   Grief 04/08/2020   History of esophageal stricture 04/08/2020   Leg swelling 03/09/2020   Atypical chest pain 01/10/2020   B12 deficiency 01/09/2020   Generalized weakness 07/14/2019   Mild malnutrition 03/25/2019  Diastolic CHF, chronic [id number]   Vitamin D deficiency 03/25/2019   S/P excision of skin lesion, follow-up exam 12/26/2017   Impingement syndrome of left shoulder region 07/04/2017   Allergic rhinitis 12/31/2016   Osteoporosis of forearm 02/13/2016   Insomnia secondary to anxiety 07/12/2015   Anxiety disorder 07/12/2015   Essential (primary) hypertension 10/07/2014   Sinus bradycardia    Hardening of the aorta (main artery of the heart) 06/26/2014   History of Clostridium difficile colitis 10/20/2013   History of pancreatitis 10/20/2013   Low back pain 10/04/2013   Do not resuscitate discussion 02/07/2013   Chest pain at rest 12/26/2011   COPD, moderate 12/05/2011   Presence of aortocoronary bypass graft    Stage 3b chronic kidney disease 09/25/2011   Hyperlipidemia    SOB (shortness of  breath)    Coronary artery disease of native artery of native heart with stable angina pectoris    Carotid artery disease     PCP: Deborra Medina MD  REFERRING PROVIDER: Deborra Medina MD  REFERRING DIAG: unsteadiness  Rationale for Evaluation and Treatment: Rehabilitation  THERAPY DIAG:  Unsteadiness on feet  Muscle weakness (generalized)  ONSET DATE: ~8months  SUBJECTIVE:                                                                                                                                                                                             SUBJECTIVE STATEMENT:  Pt reports she is feeling wobbly but is not in any pain."I just feel wobbly with all walking". No falls since last visit. Feels like she is low energy.    PERTINENT HISTORY: Pt is a 89 y.o. female who presents with unsteadiness on feet that began about 4 months ago. Pt reports she started to feel "wobbly and unsteady". MD told pt she has generalized weakness and her response was "that's just what happens when you're this old". Unsteadiness on feet ISQ since onset. Ambulates independently without an AD. No falls in the last 6 months. Pt owns a SPC but does not use it. Pt lives alone in a 2-story house close to the clinic and her daughter visits very often. Her room is upstairs; she walks up ~10 steps holding onto the L handrail with both hands to get to her room. Pt drives independently with no issues. Pt remains independent with all ADLs including grocery shopping with the exception of big loads (her daughter helps her out with that), cleaning (she has help for that), cooking ("not because I can't but because I don't want to do it"). PMH: heart surgery in 1999, COPD, SOB intermittently. Pt denies N/V,  B&B changes, unexplained weight fluctuation, saddle paresthesia, fever, night sweats, or unrelenting night pain at this time. No falls in past 6 months.   Pt accompanied by: self   PAIN:  Are you having pain?  No  PRECAUTIONS: Other: Fall risk  WEIGHT BEARING RESTRICTIONS: No  FALLS: Has patient fallen in last 6 months? No  LIVING ENVIRONMENT: Lives with: lives with their family and lives alone Lives in: House/apartment Stairs: Yes: Internal: 10 steps; on left going up and External: 3-4 steps; can reach both, walkway to front door - 3-4 steps; can reach both Has following equipment at home: Single point cane  PLOF: Independent  PATIENT GOALS: Pt does not want to feel wobbly. Wants to feel more stable and prevent the risk of falls.  OBJECTIVE:   BP:  prior to 6MWT - 137/57; after 6MWT - 145/98 6MWT: 765 feet with minimal CGA Grip strength - L: 25.33 lbs R: 30.67 lbs   DIAGNOSTIC FINDINGS: N/A  COGNITION: Overall cognitive status: Within functional limits for tasks assessed   SENSATION: WFL  COORDINATION: Slowed coordination  EDEMA: N/A   MUSCLE TONE:  Decreased muscle tone bilaterally  MUSCLE LENGTH: Not assessed  DTRs:   Not assessed   POSTURE: No significant postural limitations  LOWER EXTREMITY ROM:     Active  Right Eval Left Eval  Hip flexion 102 102  Hip extension 120 120  Hip abduction    Hip adduction    Hip internal rotation    Hip external rotation    Knee flexion    Knee extension    Ankle dorsiflexion    Ankle plantarflexion    Ankle inversion    Ankle eversion     (Blank rows = not tested)  LOWER EXTREMITY MMT:    MMT Right Eval Left Eval  Hip flexion 3 3  Hip extension 3 3  Hip abduction 3 3  Hip adduction    Hip internal rotation    Hip external rotation    Knee flexion    Knee extension    Ankle dorsiflexion    Ankle plantarflexion    Ankle inversion    Ankle eversion    (Blank rows = not tested)  BED MOBILITY:  Sit to supine Complete Independence Supine to sit Complete Independence Pt uses walker for bed transfer   TRANSFERS: Assistive device utilized: None  Sit to stand: Modified independence Stand to sit: Used  bilateral UE to stand Floor: Complete Independence   STAIRS:  Deferred to next session  GAIT: Gait pattern: decreased step length- Left, decreased stride length, decreased hip/knee flexion- Right, decreased hip/knee flexion- Left, decreased ankle dorsiflexion- Right, decreased ankle dorsiflexion- Left, shuffling, narrow BOS, poor foot clearance- Right, and poor foot clearance- Left Distance walked: 10 meters Assistive device utilized: None Level of assistance: Complete Independence Comments: shuffling gait pattern  FUNCTIONAL TESTS:  5 times sit to stand: 18.09 secs (pt used arms to push off her quads to assist in transfer) Timed up and go (TUG): 15.44 secs 10 meter walk test: self-selected - 0.83 m/s; fastest - 1.0 m/s Dynamic Gait Index: 15/24  PATIENT SURVEYS:  FOTO 41  TODAY'S TREATMENT:  DATE: 10/25/22 Therex: Nustep seat 6 UE 12 L3 17mins cuing for SPM in 50s with decent carry over STS from elevated mat table with pink tball overhead 2x 10 with min cuing for full ext with stand with decent carry over BP reading (following rest from exercise) 142/58 Sidestepping 24ft L and R with SBA for safety; min cuing for foot clearance Backward ambulation 2x 4ft with focus on increased hip ext; CGA gait belt for safety  Forward step down from 6in stair 2x 8 bilat with unilateral handrail; more difficulty with LLE     PATIENT EDUCATION: Education details: Pt was educated on diagnosis, anatomy and pathology involved, prognosis, role of PT, and was given an HEP, demonstrating exercises with proper form following verbal and tactile cues, and was given a paper hand out to continue exercise at home. Pt was educated on and agreed to plan of care Person educated: Patient Education method: Explanation, Demonstration, Tactile cues, Verbal cues, and Handouts Education  comprehension: verbalized understanding, returned demonstration, and verbal cues required  HOME EXERCISE PROGRAM: Standing marches - 1 x weekly - 2 sets - 8-12 reps Standing hip abduction with counter support - 1 x weekly  HS stretch - 3 x 30 seconds 2x/day    ASSESSMENT:  CLINICAL IMPRESSION: PT initiated therex for bilateral hip strength, increased LLE motor control, increased balance, increased global muscular endurance, and proper gait mechanics with success. Pt is able to comply with all cuing for proper technique of therex with no pain throughout session. Some guarding needed for safety, no evidence of near LOB throughout session, occassional stepping strategy. Pt would benefit from skilled PT to address the aforementioned impairments and improve overall QoL.   OBJECTIVE IMPAIRMENTS: Abnormal gait, decreased activity tolerance, decreased balance, decreased coordination, decreased endurance, decreased mobility, difficulty walking, decreased ROM, decreased strength, increased fascial restrictions, impaired flexibility, improper body mechanics, and postural dysfunction.   ACTIVITY LIMITATIONS: carrying, lifting, bending, squatting, stairs, transfers, bed mobility, dressing, and hygiene/grooming  PARTICIPATION LIMITATIONS: meal prep, cleaning, laundry, shopping, and community activity  PERSONAL FACTORS: Age, Education, Fitness, Past/current experiences, Time since onset of injury/illness/exacerbation, and 3+ comorbidities: COPD, CAD, OA  are also affecting patient's functional outcome.   REHAB POTENTIAL: Good  CLINICAL DECISION MAKING: Evolving/moderate complexity  EVALUATION COMPLEXITY: Moderate   GOALS: Goals reviewed with patient? Yes  SHORT TERM GOALS: Target date: 11/24/22  Pt will be independent with HEP in order to improve strength and balance in order to decrease fall risk and improve function at home and work. Baseline: 10/26/22 HEP given  Goal status: INITIAL    LONG  TERM GOALS: Target date: 01/08/23  Pt will demonstrate 10MWT speed of at least 1.0 m/s to demonstrate decreased fall risk and increased independence in community ambulation Baseline: Baseline: self-selected - 0.83 m/s; fastest - 0.91 m/s Goal status: INITIAL  2.  Pt will increase FOTO score to 57 to demonstrate predicted increase in functional mobility to complete ADLs Baseline: 41 Goal status: INITIAL  3.  Pt will ascend 10 steps only using LUE to hold onto the L handrail for increased independence to complete ADLs Baseline: Unsteadiness with 10 steps and requires both hands on L handrail Goal status: INITIAL  4.  Pt will demonstrate TUG of 13 secs or less in order to demonstrate decreased fall risk Baseline: 18.09 secs Goal status: INITIAL 5.  Pt will improve DGI by to least 19/24 in order to demonstrate clinically significant improvement in balance for decreased risk for falls  Baseline:  15/24 Goal status: INITIAL 6.  Pt will decrease 5TSTS to at least 13 seconds in order to demonstrate clinically significant improvement in LE strength to age matched norms, needed for increased independence with basic transfers  Baseline: 10/26/22 18.09 secs (pt used arms to push off her quads to assist in transfer) Goal status: INITIAL     PLAN:  PT FREQUENCY: 1-2x/week  PT DURATION: 8 weeks  PLANNED INTERVENTIONS: Therapeutic exercises, Therapeutic activity, Neuromuscular re-education, Balance training, Gait training, Patient/Family education, Self Care, Joint mobilization, Joint manipulation, Stair training, DME instructions, Aquatic Therapy, Dry Needling, Electrical stimulation, Spinal manipulation, Spinal mobilization, Cryotherapy, Moist heat, Traction, Ultrasound, Ionotophoresis 4mg /ml Dexamethasone, Manual therapy, and Re-evaluation.  PLAN FOR NEXT SESSION: Update HEP. Test grip strength. Test stairs. Incorporate dynamic stabilization with ramps and curbs.   Stanford Scotland SPT  Durwin Reges DPT Durwin Reges, PT 12/20/2022, 12:08 PM

## 2022-12-22 ENCOUNTER — Ambulatory Visit: Payer: No Typology Code available for payment source | Admitting: Physical Therapy

## 2022-12-27 ENCOUNTER — Ambulatory Visit: Payer: No Typology Code available for payment source | Admitting: Physical Therapy

## 2022-12-27 ENCOUNTER — Encounter: Payer: Self-pay | Admitting: Physical Therapy

## 2022-12-27 ENCOUNTER — Other Ambulatory Visit: Payer: Self-pay

## 2022-12-27 DIAGNOSIS — M6281 Muscle weakness (generalized): Secondary | ICD-10-CM

## 2022-12-27 DIAGNOSIS — R2681 Unsteadiness on feet: Secondary | ICD-10-CM

## 2022-12-27 DIAGNOSIS — R531 Weakness: Secondary | ICD-10-CM

## 2022-12-27 MED ORDER — ATORVASTATIN CALCIUM 40 MG PO TABS: 40.0000 mg | ORAL_TABLET | Freq: Every day | ORAL | 1 refills | Status: DC

## 2022-12-27 NOTE — Therapy (Signed)
OUTPATIENT PHYSICAL THERAPY THORACOLUMBAR TREATMENT   Patient Name: [name] MRN: [id number] DOB:1926-05-29, 89 y.o., female Today's Date: 12/27/2022  END OF SESSION:  PT End of Session - 12/27/22 1349     Visit Number 6    Number of Visits 17    Date for PT Re-Evaluation 01/17/23    Authorization - Visit Number 6    Authorization - Number of Visits 17    Progress Note Due on Visit 10    PT Start Time 1345    PT Stop Time 1425    PT Time Calculation (min) 40 min    Activity Tolerance Patient tolerated treatment well    Behavior During Therapy WFL for tasks assessed/performed                  Past Medical History:  Diagnosis Date   C. difficile colitis    CAD (coronary artery disease)    a. s/p CABG 1999; b. Nuclear 10/11, no scar or ischemia, EF 68%; b. Lexiscan Myoview 06/18/14: no evidence of infarction or ischemia, EF 77%, low risk study; c. 02/2019 MV: EF 56%, ? mild lat ischemia->low risk.   Carotid artery disease    a. Doppler January, 2012, 40-59% bilateral; b. 02/2017 U/S: 1-39% bilat ICA stenosis.   Closed fracture pubis 07/04/2017   COPD (chronic obstructive pulmonary disease)    Diffuse cystic mastopathy    Diverticulitis    last colonoscopy  incomplete Jan 2011   Dizziness    stable now (Oct 2011) patient tells me question of TIA, we will obtain records   Gait abnormality    Patient complains of "wobbly gait", December, 2013   GERD (gastroesophageal reflux disease)    History of migraines    Hx of CABG    a. 3 vessel in 1999   Hyperlipidemia    Hypertension    Indigestion    3 weeks, October 2011   Kidney stones    Rheumatic fever    SCC (squamous cell carcinoma), face 08/01/2016   Sinus bradycardia    Mild, December, 2013   SOB (shortness of breath)    Syncopal episodes    after 18 holes of golf and increase hydrochlorothiazide, resolved    Past Surgical History:  Procedure Laterality Date   APPENDECTOMY  1950   BREAST BIOPSY  Right 1994   BREAST CYST ASPIRATION     multiple BIL   CATARACT EXTRACTION Right 2009   CATARACT EXTRACTION Left 2011   COLONOSCOPY  4098,1191   Dr. 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   Oral thrush 11/22/2021   Mild cognitive impairment with memory loss 10/12/2021   Hyperparathyroidism due to renal insufficiency 02/02/2021   CKD (chronic kidney disease) stage 3, GFR 30-59 ml/min 01/15/2021   Grief 04/08/2020   History of esophageal stricture 04/08/2020   Leg swelling 03/09/2020   Atypical chest pain 01/10/2020   B12 deficiency 01/09/2020   Generalized weakness 07/14/2019   Mild malnutrition 03/25/2019  Diastolic CHF, chronic 03/25/2019   Vitamin D deficiency 03/25/2019   S/P excision of skin lesion, follow-up exam 12/26/2017   Impingement syndrome of left shoulder region 07/04/2017   Allergic rhinitis 12/31/2016   Osteoporosis of forearm 02/13/2016   Insomnia secondary to anxiety 07/12/2015   Anxiety disorder 07/12/2015   Essential (primary) hypertension 10/07/2014   Sinus bradycardia    Hardening of the aorta (main artery of the heart) 06/26/2014   History of Clostridium difficile colitis 10/20/2013   History of pancreatitis 10/20/2013   Low back pain 10/04/2013   Do not resuscitate discussion 02/07/2013   Chest pain at rest 12/26/2011   COPD, moderate 12/05/2011   Presence of aortocoronary bypass graft    Stage 3b chronic kidney disease 09/25/2011   Hyperlipidemia    SOB (shortness of  breath)    Coronary artery disease of native artery of native heart with stable angina pectoris    Carotid artery disease     PCP: Duncan Dull MD  REFERRING PROVIDER: Duncan Dull MD  REFERRING DIAG: unsteadiness  Rationale for Evaluation and Treatment: Rehabilitation  THERAPY DIAG:  Unsteadiness on feet  Muscle weakness (generalized)  Generalized weakness  ONSET DATE: ~14months  SUBJECTIVE:                                                                                                                                                                                             SUBJECTIVE STATEMENT:  Pt reports she is feeling wobbly but is not in any pain."I just feel wobbly with all walking". No falls since last visit. Feels like she is low energy.    PERTINENT HISTORY: Pt is a 89 y.o. female who presents with unsteadiness on feet that began about 4 months ago. Pt reports she started to feel "wobbly and unsteady". MD told pt she has generalized weakness and her response was "that's just what happens when you're this old". Unsteadiness on feet ISQ since onset. Ambulates independently without an AD. No falls in the last 6 months. Pt owns a SPC but does not use it. Pt lives alone in a 2-story house close to the clinic and her daughter visits very often. Her room is upstairs; she walks up ~10 steps holding onto the L handrail with both hands to get to her room. Pt drives independently with no issues. Pt remains independent with all ADLs including grocery shopping with the exception of big loads (her daughter helps her out with that), cleaning (she has help for that), cooking ("not because I can't but because I don't want to do it"). PMH: heart surgery in 1999, COPD, SOB intermittently.  Pt denies N/V, B&B changes, unexplained weight fluctuation, saddle paresthesia, fever, night sweats, or unrelenting night pain at this time. No falls in past 6 months.   Pt accompanied by: self   PAIN:   Are you having pain? No  PRECAUTIONS: Other: Fall risk  WEIGHT BEARING RESTRICTIONS: No  FALLS: Has patient fallen in last 6 months? No  LIVING ENVIRONMENT: Lives with: lives with their family and lives alone Lives in: House/apartment Stairs: Yes: Internal: 10 steps; on left going up and External: 3-4 steps; can reach both, walkway to front door - 3-4 steps; can reach both Has following equipment at home: Single point cane  PLOF: Independent  PATIENT GOALS: Pt does not want to feel wobbly. Wants to feel more stable and prevent the risk of falls.  OBJECTIVE:   BP:  prior to - 137/57; after - 145/98 : 765 feet with minimal CGA Grip strength - L: 25.33 lbs R: 30.67 lbs   DIAGNOSTIC FINDINGS: N/A  COGNITION: Overall cognitive status: Within functional limits for tasks assessed   SENSATION: WFL  COORDINATION: Slowed coordination  EDEMA: N/A   MUSCLE TONE:  Decreased muscle tone bilaterally  MUSCLE LENGTH: Not assessed  DTRs:   Not assessed   POSTURE: No significant postural limitations  LOWER EXTREMITY ROM:     Active  Right Eval Left Eval  Hip flexion 102 102  Hip extension 120 120  Hip abduction    Hip adduction    Hip internal rotation    Hip external rotation    Knee flexion    Knee extension    Ankle dorsiflexion    Ankle plantarflexion    Ankle inversion    Ankle eversion     (Blank rows = not tested)  LOWER EXTREMITY MMT:    MMT Right Eval Left Eval  Hip flexion 3 3  Hip extension 3 3  Hip abduction 3 3  Hip adduction    Hip internal rotation    Hip external rotation    Knee flexion    Knee extension    Ankle dorsiflexion    Ankle plantarflexion    Ankle inversion    Ankle eversion    (Blank rows = not tested)  BED MOBILITY:  Sit to supine Complete Independence Supine to sit Complete Independence Pt uses walker for bed transfer   TRANSFERS: Assistive device utilized: None  Sit to stand: Modified  independence Stand to sit: Used bilateral UE to stand Floor: Complete Independence   STAIRS:  Deferred to next session  GAIT: Gait pattern: decreased step length- Left, decreased stride length, decreased hip/knee flexion- Right, decreased hip/knee flexion- Left, decreased ankle dorsiflexion- Right, decreased ankle dorsiflexion- Left, shuffling, narrow BOS, poor foot clearance- Right, and poor foot clearance- Left Distance walked: 10 meters Assistive device utilized: None Level of assistance: Complete Independence Comments: shuffling gait pattern  FUNCTIONAL TESTS:  5 times sit to stand: 18.09 secs (pt used arms to push off her quads to assist in transfer) Timed up and go (TUG): 15.44 secs 10 meter walk test: self-selected - 0.83 m/s; fastest - 1.0 m/s Dynamic Gait Index: 15/24  PATIENT SURVEYS:  FOTO 41  TODAY'S TREATMENT:  DATE: 10/25/22 Therex: Nustep seat 6 UE 12 L3 5mins cuing for SPM in 50s with decent carry over STS from elevated mat table with pink tball overhead 2x 10 with min cuing for full ext with stand with decent carry over BP reading (following rest from exercise) 144/55 Sidestepping over and back 6in hurdle 2 finger support 2x 12 Alt Heel tap onto 6in cone tap 2 finger support 2x 12 with SBA; pt reminds brief dizziness following. Maintains she is dehydrated Rest, following BP 138/52      PATIENT EDUCATION: Education details: Pt was educated on diagnosis, anatomy and pathology involved, prognosis, role of PT, and was given an HEP, demonstrating exercises with proper form following verbal and tactile cues, and was given a paper hand out to continue exercise at home. Pt was educated on and agreed to plan of care Person educated: Patient Education method: Explanation, Demonstration, Tactile cues, Verbal cues, and Handouts Education  comprehension: verbalized understanding, returned demonstration, and verbal cues required  HOME EXERCISE PROGRAM: Standing marches - 1 x weekly - 2 sets - 8-12 reps Standing hip abduction with counter support - 1 x weekly  HS stretch - 3 x 30 seconds 2x/day    ASSESSMENT:  CLINICAL IMPRESSION: PT initiated therex for bilateral hip strength, increased LLE motor control, increased balance, increased global muscular endurance, and proper gait mechanics with success. Session modified within patient feeling "dehydrated" with BP taken, water and rest breaks given, and advisement on how to maintain hydration. Pt with some dizziness that she reports is fleeting without other symptoms. Pt would benefit from skilled PT to address the aforementioned impairments and improve overall QoL.   OBJECTIVE IMPAIRMENTS: Abnormal gait, decreased activity tolerance, decreased balance, decreased coordination, decreased endurance, decreased mobility, difficulty walking, decreased ROM, decreased strength, increased fascial restrictions, impaired flexibility, improper body mechanics, and postural dysfunction.   ACTIVITY LIMITATIONS: carrying, lifting, bending, squatting, stairs, transfers, bed mobility, dressing, and hygiene/grooming  PARTICIPATION LIMITATIONS: meal prep, cleaning, laundry, shopping, and community activity  PERSONAL FACTORS: Age, Education, Fitness, Past/current experiences, Time since onset of injury/illness/exacerbation, and 3+ comorbidities: COPD, CAD, OA  are also affecting patient's functional outcome.   REHAB POTENTIAL: Good  CLINICAL DECISION MAKING: Evolving/moderate complexity  EVALUATION COMPLEXITY: Moderate   GOALS: Goals reviewed with patient? Yes  SHORT TERM GOALS: Target date: 11/24/22  Pt will be independent with HEP in order to improve strength and balance in order to decrease fall risk and improve function at home and work. Baseline: 10/26/22 HEP given  Goal status:  INITIAL    LONG TERM GOALS: Target date: 01/08/23  Pt will demonstrate 10MWT speed of at least 1.0 m/s to demonstrate decreased fall risk and increased independence in community ambulation Baseline: Baseline: self-selected - 0.83 m/s; fastest - 0.91 m/s Goal status: INITIAL  2.  Pt will increase FOTO score to 57 to demonstrate predicted increase in functional mobility to complete ADLs Baseline: 41 Goal status: INITIAL  3.  Pt will ascend 10 steps only using LUE to hold onto the L handrail for increased independence to complete ADLs Baseline: Unsteadiness with 10 steps and requires both hands on L handrail Goal status: INITIAL  4.  Pt will demonstrate TUG of 13 secs or less in order to demonstrate decreased fall risk Baseline: 18.09 secs Goal status: INITIAL 5.  Pt will improve DGI by to least 19/24 in order to demonstrate clinically significant improvement in balance for decreased risk for falls  Baseline: 15/24 Goal status: INITIAL 6.  Pt  will decrease 5TSTS to at least 13 seconds in order to demonstrate clinically significant improvement in LE strength to age matched norms, needed for increased independence with basic transfers  Baseline: 10/26/22 18.09 secs (pt used arms to push off her quads to assist in transfer) Goal status: INITIAL     PLAN:  PT FREQUENCY: 1-2x/week  PT DURATION: 8 weeks  PLANNED INTERVENTIONS: Therapeutic exercises, Therapeutic activity, Neuromuscular re-education, Balance training, Gait training, Patient/Family education, Self Care, Joint mobilization, Joint manipulation, Stair training, DME instructions, Aquatic Therapy, Dry Needling, Electrical stimulation, Spinal manipulation, Spinal mobilization, Cryotherapy, Moist heat, Traction, Ultrasound, Ionotophoresis 4mg /ml Dexamethasone, Manual therapy, and Re-evaluation.  PLAN FOR NEXT SESSION: Update HEP. Test grip strength. Test stairs. Incorporate dynamic stabilization with ramps and curbs.   Marisue Humble SPT  Hilda Lias DPT Hilda Lias, PT 12/27/2022, 2:39 PM

## 2022-12-28 NOTE — Therapy (Signed)
OUTPATIENT PHYSICAL THERAPY THORACOLUMBAR TREATMENT   Patient Name: Katrina ClementBetty B Allen MRN: [id number] DOB:[birth year], 89 y.o., female Today's Date: 12/29/2022  END OF SESSION:  PT End of Session - 12/29/22 1357     Visit Number 7    Number of Visits 17    Date for PT Re-Evaluation 01/17/23    Authorization - Visit Number 7    Authorization - Number of Visits 17    Progress Note Due on Visit 10    PT Start Time 1351    PT Stop Time 1430    PT Time Calculation (min) 39 min    Activity Tolerance Patient tolerated treatment well    Behavior During Therapy WFL for tasks assessed/performed                   Past Medical History:  Diagnosis Date   C. difficile colitis    CAD (coronary artery disease)    a. s/p CABG 1999; b. Nuclear 10/11, no scar or ischemia, EF 68%; b. Lexiscan Myoview 06/18/14: no evidence of infarction or ischemia, EF 77%, low risk study; c. 02/2019 MV: EF 56%, ? mild lat ischemia->low risk.   Carotid artery disease    a. Doppler January, 2012, 40-59% bilateral; b. 02/2017 U/S: 1-39% bilat ICA stenosis.   Closed fracture pubis 07/04/2017   COPD (chronic obstructive pulmonary disease)    Diffuse cystic mastopathy    Diverticulitis    last colonoscopy  incomplete Jan 2011   Dizziness    stable now (Oct 2011) patient tells me question of TIA, we will obtain records   Gait abnormality    Patient complains of "wobbly gait", December, 2013   GERD (gastroesophageal reflux disease)    History of migraines    Hx of CABG    a. 3 vessel in 1999   Hyperlipidemia    Hypertension    Indigestion    3 weeks, October 2011   Kidney stones    Rheumatic fever    SCC (squamous cell carcinoma), face 08/01/2016   Sinus bradycardia    Mild, December, 2013   SOB (shortness of breath)    Syncopal episodes    after 18 holes of golf and increase hydrochlorothiazide, resolved    Past Surgical History:  Procedure Laterality Date   APPENDECTOMY  1950   BREAST  BIOPSY Right 1994   BREAST CYST ASPIRATION     multiple BIL   CATARACT EXTRACTION Right 2009   CATARACT EXTRACTION Left 2011   COLONOSCOPY  [id number]   Dr. 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   Oral thrush 11/22/2021   Mild cognitive impairment with memory loss 10/12/2021   Hyperparathyroidism due to renal insufficiency 02/02/2021   CKD (chronic kidney disease) stage 3, GFR 30-59 ml/min 01/15/2021   Grief 04/08/2020   History of esophageal stricture 04/08/2020   Leg swelling 03/09/2020   Atypical chest pain 01/10/2020   B12 deficiency 01/09/2020   Generalized weakness 07/14/2019   Mild malnutrition  03/25/2019   Diastolic CHF, chronic 03/25/2019   Vitamin D deficiency 03/25/2019   S/P excision of skin lesion, follow-up exam 12/26/2017   Impingement syndrome of left shoulder region 07/04/2017   Allergic rhinitis 12/31/2016   Osteoporosis of forearm 02/13/2016   Insomnia secondary to anxiety 07/12/2015   Anxiety disorder 07/12/2015   Essential (primary) hypertension 10/07/2014   Sinus bradycardia    Hardening of the aorta (main artery of the heart) 06/26/2014   History of Clostridium difficile colitis 10/20/2013   History of pancreatitis 10/20/2013   Low back pain 10/04/2013   Do not resuscitate discussion 02/07/2013   Chest pain at rest 12/26/2011   COPD, moderate 12/05/2011   Presence of aortocoronary bypass graft    Stage 3b chronic kidney disease 09/25/2011   Hyperlipidemia    SOB  (shortness of breath)    Coronary artery disease of native artery of native heart with stable angina pectoris    Carotid artery disease     PCP: Duncan Dull MD  REFERRING PROVIDER: Duncan Dull MD  REFERRING DIAG: unsteadiness  Rationale for Evaluation and Treatment: Rehabilitation  THERAPY DIAG:  Unsteadiness on feet  Muscle weakness (generalized)  Generalized weakness  Abnormality of gait and mobility  Difficulty in walking, not elsewhere classified  Other lack of coordination  Other abnormalities of gait and mobility  ONSET DATE: ~35months  SUBJECTIVE:                                                                                                                                                                                             SUBJECTIVE STATEMENT:   Pt reports feeling wobbly today.  She states she feels worse today and has been mostly just reading because of the weather.    PERTINENT HISTORY: Pt is a 89 y.o. female who presents with unsteadiness on feet that began about 4 months ago. Pt reports she started to feel "wobbly and unsteady". MD told pt she has generalized weakness and her response was "that's just what happens when you're this old". Unsteadiness on feet ISQ since onset. Ambulates independently without an AD. No falls in the last 6 months. Pt owns a SPC but does not use it. Pt lives alone in a 2-story house close to the clinic and her daughter visits very often. Her room is upstairs; she walks up ~10 steps holding onto the L handrail with both hands to get to her room. Pt drives independently with no issues. Pt remains independent with all ADLs including grocery shopping with the exception of big loads (her daughter helps her out with that), cleaning (she has help for  that), cooking ("not because I can't but because I don't want to do it"). PMH: heart surgery in 1999, COPD, SOB intermittently. Pt denies N/V, B&B changes, unexplained weight fluctuation,  saddle paresthesia, fever, night sweats, or unrelenting night pain at this time. No falls in past 6 months.   Pt accompanied by: self     PAIN:  Are you having pain? No  PRECAUTIONS: Other: Fall risk  WEIGHT BEARING RESTRICTIONS: No  FALLS: Has patient fallen in last 6 months? No  LIVING ENVIRONMENT: Lives with: lives with their family and lives alone Lives in: House/apartment Stairs: Yes: Internal: 10 steps; on left going up and External: 3-4 steps; can reach both, walkway to front door - 3-4 steps; can reach both Has following equipment at home: Single point cane  PLOF: Independent  PATIENT GOALS: Pt does not want to feel wobbly. Wants to feel more stable and prevent the risk of falls.  OBJECTIVE:   BP:  prior to - 137/57; after - 145/98 : 765 feet with minimal CGA Grip strength - L: 25.33 lbs R: 30.67 lbs   DIAGNOSTIC FINDINGS: N/A  COGNITION: Overall cognitive status: Within functional limits for tasks assessed   SENSATION: WFL  COORDINATION: Slowed coordination  EDEMA: N/A   MUSCLE TONE:  Decreased muscle tone bilaterally  MUSCLE LENGTH: Not assessed  DTRs:   Not assessed   POSTURE: No significant postural limitations  LOWER EXTREMITY ROM:     Active  Right Eval Left Eval  Hip flexion 102 102  Hip extension 120 120  Hip abduction    Hip adduction    Hip internal rotation    Hip external rotation    Knee flexion    Knee extension    Ankle dorsiflexion    Ankle plantarflexion    Ankle inversion    Ankle eversion     (Blank rows = not tested)  LOWER EXTREMITY MMT:    MMT Right Eval Left Eval  Hip flexion 3 3  Hip extension 3 3  Hip abduction 3 3  Hip adduction    Hip internal rotation    Hip external rotation    Knee flexion    Knee extension    Ankle dorsiflexion    Ankle plantarflexion    Ankle inversion    Ankle eversion    (Blank rows = not tested)  BED MOBILITY:  Sit to supine Complete  Independence Supine to sit Complete Independence Pt uses walker for bed transfer   TRANSFERS: Assistive device utilized: None  Sit to stand: Modified independence Stand to sit: Used bilateral UE to stand Floor: Complete Independence   STAIRS:  Deferred to next session  GAIT: Gait pattern: decreased step length- Left, decreased stride length, decreased hip/knee flexion- Right, decreased hip/knee flexion- Left, decreased ankle dorsiflexion- Right, decreased ankle dorsiflexion- Left, shuffling, narrow BOS, poor foot clearance- Right, and poor foot clearance- Left Distance walked: 10 meters Assistive device utilized: None Level of assistance: Complete Independence Comments: shuffling gait pattern  FUNCTIONAL TESTS:  5 times sit to stand: 18.09 secs (pt used arms to push off her quads to assist in transfer) Timed up and go (TUG): 15.44 secs 10 meter walk test: self-selected - 0.83 m/s; fastest - 1.0 m/s Dynamic Gait Index: 15/24  PATIENT SURVEYS:  FOTO 41  TODAY'S TREATMENT: DATE: 12/29/22   TherEx:  Nustep seat 6 UE 12 L3 cuing for SPM in 50s with decent carry over BP:  125/54 in sitting STS from elevated  mat table with pink tball overhead 2x10 with min cuing for full ext with stand with decent carry over  Sidestepping over and back 6in hurdle 2 finger support 2x 12  Alt Heel tap onto 6in cone tap 2x 12 with CGA;   Rest following BP 127/55      PATIENT EDUCATION: Education details: Pt was educated on diagnosis, anatomy and pathology involved, prognosis, role of PT, and was given an HEP, demonstrating exercises with proper form following verbal and tactile cues, and was given a paper hand out to continue exercise at home. Pt was educated on and agreed to plan of care Person educated: Patient Education method: Explanation, Demonstration, Tactile cues, Verbal cues, and Handouts Education comprehension: verbalized understanding, returned demonstration, and verbal  cues required  HOME EXERCISE PROGRAM: Standing marches - 1 x weekly - 2 sets - 8-12 reps Standing hip abduction with counter support - 1 x weekly  HS stretch - 3 x 30 seconds 2x/day    ASSESSMENT:  CLINICAL IMPRESSION:  Pt performed well with bilateral hip strengthening and is making significant improvements in her balance deficits.  Pt able to tolerate 6" cone taps without any UE support and CGA with use of gait belt.  Pt still stating that she is dehydrated on multiple occasions and was given water throughout the session as well as BP monitored throughout.   Pt will continue to benefit from skilled therapy to address remaining deficits in order to improve overall QoL and return to PLOF.     OBJECTIVE IMPAIRMENTS: Abnormal gait, decreased activity tolerance, decreased balance, decreased coordination, decreased endurance, decreased mobility, difficulty walking, decreased ROM, decreased strength, increased fascial restrictions, impaired flexibility, improper body mechanics, and postural dysfunction.   ACTIVITY LIMITATIONS: carrying, lifting, bending, squatting, stairs, transfers, bed mobility, dressing, and hygiene/grooming  PARTICIPATION LIMITATIONS: meal prep, cleaning, laundry, shopping, and community activity  PERSONAL FACTORS: Age, Education, Fitness, Past/current experiences, Time since onset of injury/illness/exacerbation, and 3+ comorbidities: COPD, CAD, OA  are also affecting patient's functional outcome.   REHAB POTENTIAL: Good  CLINICAL DECISION MAKING: Evolving/moderate complexity  EVALUATION COMPLEXITY: Moderate   GOALS: Goals reviewed with patient? Yes  SHORT TERM GOALS: Target date: 11/24/22  Pt will be independent with HEP in order to improve strength and balance in order to decrease fall risk and improve function at home and work. Baseline: 10/26/22 HEP given  Goal status: INITIAL    LONG TERM GOALS: Target date: 01/08/23  Pt will demonstrate speed of at  least 1.0 m/s to demonstrate decreased fall risk and increased independence in community ambulation Baseline: Baseline: self-selected - 0.83 m/s; fastest - 0.91 m/s Goal status: INITIAL  2.  Pt will increase FOTO score to 57 to demonstrate predicted increase in functional mobility to complete ADLs Baseline: 41 Goal status: INITIAL  3.  Pt will ascend 10 steps only using LUE to hold onto the L handrail for increased independence to complete ADLs Baseline: Unsteadiness with 10 steps and requires both hands on L handrail Goal status: INITIAL  4.  Pt will demonstrate TUG of 13 secs or less in order to demonstrate decreased fall risk Baseline: 18.09 secs Goal status: INITIAL 5.  Pt will improve DGI by to least 19/24 in order to demonstrate clinically significant improvement in balance for decreased risk for falls  Baseline: 15/24 Goal status: INITIAL 6.  Pt will decrease 5TSTS to at least 13 seconds in order to demonstrate clinically significant improvement in LE strength to age matched norms, needed  for increased independence with basic transfers  Baseline: 10/26/22 18.09 secs (pt used arms to push off her quads to assist in transfer) Goal status: INITIAL     PLAN:  PT FREQUENCY: 1-2x/week  PT DURATION: 8 weeks  PLANNED INTERVENTIONS: Therapeutic exercises, Therapeutic activity, Neuromuscular re-education, Balance training, Gait training, Patient/Family education, Self Care, Joint mobilization, Joint manipulation, Stair training, DME instructions, Aquatic Therapy, Dry Needling, Electrical stimulation, Spinal manipulation, Spinal mobilization, Cryotherapy, Moist heat, Traction, Ultrasound, Ionotophoresis 4mg /ml Dexamethasone, Manual therapy, and Re-evaluation.  PLAN FOR NEXT SESSION:  Update HEP. Test grip strength. Test stairs. Incorporate dynamic stabilization with ramps and curbs.     Nolon Bussing, PT, DPT Physical Therapist - [facility]   12/29/22, 4:18 PM

## 2022-12-29 ENCOUNTER — Ambulatory Visit: Payer: No Typology Code available for payment source

## 2022-12-29 DIAGNOSIS — R262 Difficulty in walking, not elsewhere classified: Secondary | ICD-10-CM

## 2022-12-29 DIAGNOSIS — R2681 Unsteadiness on feet: Secondary | ICD-10-CM | POA: Diagnosis not present

## 2022-12-29 DIAGNOSIS — R278 Other lack of coordination: Secondary | ICD-10-CM

## 2022-12-29 DIAGNOSIS — R269 Unspecified abnormalities of gait and mobility: Secondary | ICD-10-CM

## 2022-12-29 DIAGNOSIS — R531 Weakness: Secondary | ICD-10-CM

## 2022-12-29 DIAGNOSIS — M6281 Muscle weakness (generalized): Secondary | ICD-10-CM

## 2022-12-29 DIAGNOSIS — R2689 Other abnormalities of gait and mobility: Secondary | ICD-10-CM

## 2023-01-03 ENCOUNTER — Ambulatory Visit: Payer: No Typology Code available for payment source

## 2023-01-03 DIAGNOSIS — H8110 Benign paroxysmal vertigo, unspecified ear: Secondary | ICD-10-CM | POA: Diagnosis not present

## 2023-01-05 ENCOUNTER — Ambulatory Visit: Payer: No Typology Code available for payment source

## 2023-01-10 ENCOUNTER — Ambulatory Visit: Payer: No Typology Code available for payment source

## 2023-01-12 ENCOUNTER — Ambulatory Visit: Payer: No Typology Code available for payment source

## 2023-01-12 DIAGNOSIS — M6281 Muscle weakness (generalized): Secondary | ICD-10-CM | POA: Diagnosis not present

## 2023-01-12 DIAGNOSIS — R262 Difficulty in walking, not elsewhere classified: Secondary | ICD-10-CM

## 2023-01-12 DIAGNOSIS — R2681 Unsteadiness on feet: Secondary | ICD-10-CM

## 2023-01-12 DIAGNOSIS — R269 Unspecified abnormalities of gait and mobility: Secondary | ICD-10-CM

## 2023-01-12 DIAGNOSIS — R278 Other lack of coordination: Secondary | ICD-10-CM

## 2023-01-12 DIAGNOSIS — R531 Weakness: Secondary | ICD-10-CM

## 2023-01-12 DIAGNOSIS — R2689 Other abnormalities of gait and mobility: Secondary | ICD-10-CM

## 2023-01-12 NOTE — Therapy (Signed)
OUTPATIENT PHYSICAL THERAPY THORACOLUMBAR TREATMENT   Patient Name: ALAZAY LEICHT MRN: [id number] DOB:1926/05/31, 89 y.o., female Today's Date: 01/12/2023  END OF SESSION:  PT End of Session - 01/12/23 1435     Visit Number 8    Number of Visits 17    Date for PT Re-Evaluation 01/17/23    Authorization - Visit Number 8    Authorization - Number of Visits 17    Progress Note Due on Visit 10    PT Start Time 1435    PT Stop Time 1515    PT Time Calculation (min) 40 min    Activity Tolerance Patient tolerated treatment well    Behavior During Therapy WFL for tasks assessed/performed              Past Medical History:  Diagnosis Date   C. difficile colitis    CAD (coronary artery disease)    a. s/p CABG 1999; b. Nuclear 10/11, no scar or ischemia, EF 68%; b. Lexiscan Myoview 06/18/14: no evidence of infarction or ischemia, EF 77%, low risk study; c. 02/2019 MV: EF 56%, ? mild lat ischemia->low risk.   Carotid artery disease    a. Doppler January, 2012, 40-59% bilateral; b. 02/2017 U/S: 1-39% bilat ICA stenosis.   Closed fracture pubis 07/04/2017   COPD (chronic obstructive pulmonary disease)    Diffuse cystic mastopathy    Diverticulitis    last colonoscopy  incomplete Jan 2011   Dizziness    stable now (Oct 2011) patient tells me question of TIA, we will obtain records   Gait abnormality    Patient complains of "wobbly gait", December, 2013   GERD (gastroesophageal reflux disease)    History of migraines    Hx of CABG    a. 3 vessel in 1999   Hyperlipidemia    Hypertension    Indigestion    3 weeks, October 2011   Kidney stones    Rheumatic fever    SCC (squamous cell carcinoma), face 08/01/2016   Sinus bradycardia    Mild, December, 2013   SOB (shortness of breath)    Syncopal episodes    after 18 holes of golf and increase hydrochlorothiazide, resolved    Past Surgical History:  Procedure Laterality Date   APPENDECTOMY  1950   BREAST BIOPSY Right  1994   BREAST CYST ASPIRATION     multiple BIL   CATARACT EXTRACTION Right 2009   CATARACT EXTRACTION Left 2011   COLONOSCOPY  [id number]   Dr. 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   Oral thrush 11/22/2021   Mild cognitive impairment with memory loss 10/12/2021   Hyperparathyroidism due to renal insufficiency 02/02/2021   CKD (chronic kidney disease) stage 3, GFR 30-59 ml/min 01/15/2021   Grief 04/08/2020   History of esophageal stricture 04/08/2020   Leg swelling 03/09/2020   Atypical chest pain 01/10/2020   B12 deficiency 01/09/2020   Generalized weakness 07/14/2019   Mild malnutrition 03/25/2019   Diastolic CHF,  chronic 03/25/2019   Vitamin D deficiency 03/25/2019   S/P excision of skin lesion, follow-up exam 12/26/2017   Impingement syndrome of left shoulder region 07/04/2017   Allergic rhinitis 12/31/2016   Osteoporosis of forearm 02/13/2016   Insomnia secondary to anxiety 07/12/2015   Anxiety disorder 07/12/2015   Essential (primary) hypertension 10/07/2014   Sinus bradycardia    Hardening of the aorta (main artery of the heart) 06/26/2014   History of Clostridium difficile colitis 10/20/2013   History of pancreatitis 10/20/2013   Low back pain 10/04/2013   Do not resuscitate discussion 02/07/2013   Chest pain at rest 12/26/2011   COPD, moderate 12/05/2011   Presence of aortocoronary bypass graft    Stage 3b chronic kidney disease 09/25/2011   Hyperlipidemia    SOB (shortness of  breath)    Coronary artery disease of native artery of native heart with stable angina pectoris    Carotid artery disease     PCP: Duncan Dull MD  REFERRING PROVIDER: Duncan Dull MD  REFERRING DIAG: unsteadiness  Rationale for Evaluation and Treatment: Rehabilitation  THERAPY DIAG:  Unsteadiness on feet  Muscle weakness (generalized)  Generalized weakness  Abnormality of gait and mobility  Difficulty in walking, not elsewhere classified  Other lack of coordination  Other abnormalities of gait and mobility  ONSET DATE: ~75months  SUBJECTIVE:                                                                                                                                                                                             SUBJECTIVE STATEMENT:   Pt reports she is feeling rough today because she had vertigo this week, and she's unsure of if it's any better.  Pt states she probably shouldn't have come today.      PERTINENT HISTORY: Pt is a 89 y.o. female who presents with unsteadiness on feet that began about 4 months ago. Pt reports she started to feel "wobbly and unsteady". MD told pt she has generalized weakness and her response was "that's just what happens when you're this old". Unsteadiness on feet ISQ since onset. Ambulates independently without an AD. No falls in the last 6 months. Pt owns a SPC but does not use it. Pt lives alone in a 2-story house close to the clinic and her daughter visits very often. Her room is upstairs; she walks up ~10 steps holding onto the L handrail with both hands to get to her room. Pt drives independently with no issues. Pt remains independent with all ADLs including grocery shopping with the exception of big loads (her daughter helps her out with that),  cleaning (she has help for that), cooking ("not because I can't but because I don't want to do it"). PMH: heart surgery in 1999, COPD, SOB intermittently. Pt denies N/V, B&B changes,  unexplained weight fluctuation, saddle paresthesia, fever, night sweats, or unrelenting night pain at this time. No falls in past 6 months.   Pt accompanied by: self     PAIN:  Are you having pain? No  PRECAUTIONS: Other: Fall risk  WEIGHT BEARING RESTRICTIONS: No  FALLS: Has patient fallen in last 6 months? No  LIVING ENVIRONMENT: Lives with: lives with their family and lives alone Lives in: House/apartment Stairs: Yes: Internal: 10 steps; on left going up and External: 3-4 steps; can reach both, walkway to front door - 3-4 steps; can reach both Has following equipment at home: Single point cane  PLOF: Independent  PATIENT GOALS: Pt does not want to feel wobbly. Wants to feel more stable and prevent the risk of falls.  OBJECTIVE:   BP:  prior to - 137/57; after - 145/98 : 765 feet with minimal CGA Grip strength - L: 25.33 lbs R: 30.67 lbs   DIAGNOSTIC FINDINGS: N/A  COGNITION: Overall cognitive status: Within functional limits for tasks assessed   SENSATION: WFL  COORDINATION: Slowed coordination  EDEMA: N/A   MUSCLE TONE:  Decreased muscle tone bilaterally  MUSCLE LENGTH: Not assessed  DTRs:   Not assessed   POSTURE: No significant postural limitations  LOWER EXTREMITY ROM:     Active  Right Eval Left Eval  Hip flexion 102 102  Hip extension 120 120  Hip abduction    Hip adduction    Hip internal rotation    Hip external rotation    Knee flexion    Knee extension    Ankle dorsiflexion    Ankle plantarflexion    Ankle inversion    Ankle eversion     (Blank rows = not tested)  LOWER EXTREMITY MMT:    MMT Right Eval Left Eval  Hip flexion 3 3  Hip extension 3 3  Hip abduction 3 3  Hip adduction    Hip internal rotation    Hip external rotation    Knee flexion    Knee extension    Ankle dorsiflexion    Ankle plantarflexion    Ankle inversion    Ankle eversion    (Blank rows = not tested)  BED MOBILITY:  Sit  to supine Complete Independence Supine to sit Complete Independence Pt uses walker for bed transfer   TRANSFERS: Assistive device utilized: None  Sit to stand: Modified independence Stand to sit: Used bilateral UE to stand Floor: Complete Independence   STAIRS:  Deferred to next session  GAIT: Gait pattern: decreased step length- Left, decreased stride length, decreased hip/knee flexion- Right, decreased hip/knee flexion- Left, decreased ankle dorsiflexion- Right, decreased ankle dorsiflexion- Left, shuffling, narrow BOS, poor foot clearance- Right, and poor foot clearance- Left Distance walked: 10 meters Assistive device utilized: None Level of assistance: Complete Independence Comments: shuffling gait pattern  FUNCTIONAL TESTS:  5 times sit to stand: 18.09 secs (pt used arms to push off her quads to assist in transfer) Timed up and go (TUG): 15.44 secs 10 meter walk test: self-selected - 0.83 m/s; fastest - 1.0 m/s Dynamic Gait Index: 15/24  PATIENT SURVEYS:  FOTO 41  TODAY'S TREATMENT: DATE: 01/12/23   TherEx:  Nustep seat 6 UE 12 L3 cuing for SPM in 50s with decent carry over  BP: 140/86  in sitting  Seated LAQ, 2# AW donned, 2x10 each LE Seated marching, 2# AW donned, 2x10 each LE   Neuro:  Pt noted to be reporting symptoms of BPPV, so pt was screened for BPPV:  Sidelying test  (to test posterior semicircular canal) and supine head roll test (to test posterior semicircular canal): negative bilaterally (no visible nystagmus or reported symptoms)     PATIENT EDUCATION: Education details: Pt was educated on diagnosis, anatomy and pathology involved, prognosis, role of PT, and was given an HEP, demonstrating exercises with proper form following verbal and tactile cues, and was given a paper hand out to continue exercise at home. Pt was educated on and agreed to plan of care Person educated: Patient Education method: Explanation, Demonstration, Tactile  cues, Verbal cues, and Handouts Education comprehension: verbalized understanding, returned demonstration, and verbal cues required  HOME EXERCISE PROGRAM: Standing marches - 1 x weekly - 2 sets - 8-12 reps Standing hip abduction with counter support - 1 x weekly  HS stretch - 3 x 30 seconds 2x/day    ASSESSMENT:  CLINICAL IMPRESSION:  Pt unable to participate completely in therapy session today due to the instability/dizziness that the patient was reporting initially.  Pt elected to continue with therapy and perform sitting exercises and then was evaluated for BPPV.  Pt at this time is not exhibiting signs or symptoms indicative of BPPV at this time, but will continue to monitor throughout therapy progression.   Pt will continue to benefit from skilled therapy to address remaining deficits in order to improve overall QoL and return to PLOF.       OBJECTIVE IMPAIRMENTS: Abnormal gait, decreased activity tolerance, decreased balance, decreased coordination, decreased endurance, decreased mobility, difficulty walking, decreased ROM, decreased strength, increased fascial restrictions, impaired flexibility, improper body mechanics, and postural dysfunction.   ACTIVITY LIMITATIONS: carrying, lifting, bending, squatting, stairs, transfers, bed mobility, dressing, and hygiene/grooming  PARTICIPATION LIMITATIONS: meal prep, cleaning, laundry, shopping, and community activity  PERSONAL FACTORS: Age, Education, Fitness, Past/current experiences, Time since onset of injury/illness/exacerbation, and 3+ comorbidities: COPD, CAD, OA  are also affecting patient's functional outcome.   REHAB POTENTIAL: Good  CLINICAL DECISION MAKING: Evolving/moderate complexity  EVALUATION COMPLEXITY: Moderate   GOALS: Goals reviewed with patient? Yes  SHORT TERM GOALS: Target date: 11/24/22  Pt will be independent with HEP in order to improve strength and balance in order to decrease fall risk and improve  function at home and work. Baseline: 10/26/22 HEP given  Goal status: INITIAL    LONG TERM GOALS: Target date: 01/08/23  Pt will demonstrate speed of at least 1.0 m/s to demonstrate decreased fall risk and increased independence in community ambulation Baseline: Baseline: self-selected - 0.83 m/s; fastest - 0.91 m/s Goal status: INITIAL  2.  Pt will increase FOTO score to 57 to demonstrate predicted increase in functional mobility to complete ADLs Baseline: 41 Goal status: INITIAL  3.  Pt will ascend 10 steps only using LUE to hold onto the L handrail for increased independence to complete ADLs Baseline: Unsteadiness with 10 steps and requires both hands on L handrail Goal status: INITIAL  4.  Pt will demonstrate TUG of 13 secs or less in order to demonstrate decreased fall risk Baseline: 18.09 secs Goal status: INITIAL 5.  Pt will improve DGI by to least 19/24 in order to demonstrate clinically significant improvement in balance for decreased risk for falls  Baseline: 15/24 Goal status: INITIAL 6.  Pt will decrease 5TSTS to at  least 13 seconds in order to demonstrate clinically significant improvement in LE strength to age matched norms, needed for increased independence with basic transfers  Baseline: 10/26/22 18.09 secs (pt used arms to push off her quads to assist in transfer) Goal status: INITIAL     PLAN:  PT FREQUENCY: 1-2x/week  PT DURATION: 8 weeks  PLANNED INTERVENTIONS: Therapeutic exercises, Therapeutic activity, Neuromuscular re-education, Balance training, Gait training, Patient/Family education, Self Care, Joint mobilization, Joint manipulation, Stair training, DME instructions, Aquatic Therapy, Dry Needling, Electrical stimulation, Spinal manipulation, Spinal mobilization, Cryotherapy, Moist heat, Traction, Ultrasound, Ionotophoresis /ml Dexamethasone, Manual therapy, and Re-evaluation.  PLAN FOR NEXT SESSION:   Update HEP. Test grip strength. Test  stairs. Incorporate dynamic stabilization with ramps and curbs.     Nolon Bussing, PT, DPT Physical Therapist - [facility]  01/12/23, 4:24 PM

## 2023-01-16 NOTE — Progress Notes (Unsigned)
Cardiology Office Note  Date:  01/17/2023   ID:  [name], DOB 13-Dec-1925, MRN [id number]  PCP:  Katrina Shams, MD   Chief Complaint  Patient presents with   6 month follow up     Patient c/o shortness of breath & vertigo off & on x 2 weeks. Medications reviewed by the patient verbally.     HPI:  [name] is a pleasant 89 year old woman with a history of  CAD, bypass surgery in 1999, Stress test October 2017 with no ischemia COPD, Smoking for 40 years who quit in 1985,  chronic renal insufficiency,  hypertension,  hyperlipidemia,  40-50% bilateral carotid arterial disease  EF >60% in 05/2015  C. difficile in January 2015. H/o fall wet floor  02/2017, Suffered a pelvic fracture, left shoulder pain who presents for routine followup of her coronary artery disease  Last seen by myself in clinic October 2023 In follow-up today reports having issues with vertigo, was given meclizine and Valium by urgent care Still may have symptoms, ran out of her meclizine, did not take any of the benzodiazepine  Also several nights ago woke up with 1/2 hour of tachycardia Went away without intervention  Reports amlodipine held for low blood pressure, dizziness Recent sampling of blood pressure at home ranging from 107 systolic up to 130s In general has been well-controlled without amlodipine  Using cane for balance Has a walker but not using it  Lasix as needed Minimal ankle swelling  Denies significant chest pain concerning for angina  EKG personally reviewed by myself on todays visit Normal sinus rhythm rate 72 bpm T wave abnormality V1 through V6 inferior leads, no change from prior EKG   Presented to the emergency room May 14, 2022 with central chest pain, nonradiating Troponin negative x1  Other past medical history reviewed Prior stress test in 2020 and 2019 Prior stress test reviewed with her September 2020, low risk  Previously treated with chemotherapy for  cancer on right lower extremity  Hospital admission 12/21/2016 for COPD exacerbation  PMH:   has a past medical history of C. difficile colitis, CAD (coronary artery disease), Carotid artery disease (HCC), Closed fracture pubis (HCC) (07/04/2017), COPD (chronic obstructive pulmonary disease) (HCC), Diffuse cystic mastopathy, Diverticulitis, Dizziness, Gait abnormality, GERD (gastroesophageal reflux disease), History of migraines, CABG, Hyperlipidemia, Hypertension, Indigestion, Kidney stones, Rheumatic fever, SCC (squamous cell carcinoma), face (08/01/2016), Sinus bradycardia, SOB (shortness of breath), and Syncopal episodes.  PSH:    Past Surgical History:  Procedure Laterality Date   APPENDECTOMY  1950   BREAST BIOPSY Right 1994   BREAST CYST ASPIRATION     multiple BIL   CATARACT EXTRACTION Right 2009   CATARACT EXTRACTION Left 2011   COLONOSCOPY  [id number]   Dr. Evette Cristal   CORONARY ARTERY BYPASS GRAFT  1999   esophagus stretched     TONSILLECTOMY  1939   TOTAL ABDOMINAL HYSTERECTOMY  1968   due to metrorrhagia    Current Outpatient Medications  Medication Sig Dispense Refill   atorvastatin (LIPITOR) 40 MG tablet Take 1 tablet (40 mg total) by mouth daily. 90 tablet 1   Bacillus Coagulans-Inulin (PROBIOTIC) 1-250 BILLION-MG CAPS Take 1 capsule by mouth daily. 30 capsule 0   clopidogrel (PLAVIX) 75 MG tablet Take 1 tablet (75 mg total) by mouth daily. 90 tablet 3   furosemide (LASIX) 20 MG tablet TAKE 1 TABLET BY MOUTH NOT MORE THAN ONCE A WEEK AS NEEDED 12 tablet 3   Ipratropium-Albuterol (COMBIVENT RESPIMAT)  20-100 MCG/ACT AERS respimat INHALE ONE PUFF EVERY SIX HOURS 4 g 5   isosorbide mononitrate (IMDUR) 60 MG 24 hr tablet TAKE ONE AND ONE-HALF TABLET BY MOUTH TWICE DAILY 270 tablet 0   losartan (COZAAR) 100 MG tablet Take 1 tablet (100 mg total) by mouth daily. 90 tablet 3   magic mouthwash (lidocaine, diphenhydrAMINE, alum & mag hydroxide) suspension Swish and spit 5 mLs 4 (four)  times daily as needed for mouth pain. 360 mL 1   meclizine (ANTIVERT) 25 MG tablet Take 1 tablet (25 mg total) by mouth 3 (three) times daily as needed for dizziness. 60 tablet 2   mirtazapine (REMERON) 15 MG tablet TAKE 1 TABLET BY MOUTH NIGHTLY 90 tablet 1   nitroGLYCERIN (NITROSTAT) 0.4 MG SL tablet PLACE 1 TABLET UNDER TONGUE EVERY 5 MINUTES AS NEEDED FOR CHEST PAIN. IF NO RELIEF IN 15 MINUTES CALL 911 ( MAXIMUM 3 TABLETS). 25 tablet 3   nystatin (MYCOSTATIN) 100000 UNIT/ML suspension Take 5 mLs (500,000 Units total) by mouth 4 (four) times daily. 60 mL 0   ondansetron (ZOFRAN) 4 MG tablet Take 1 tablet (4 mg total) by mouth every 8 (eight) hours as needed for nausea or vomiting. 40 tablet 3   pantoprazole (PROTONIX) 40 MG tablet Take 1 tablet (40 mg total) by mouth 2 (two) times daily before a meal. 180 tablet 3   Probiotic Product (PROBIOTIC PO) Take 1 capsule by mouth daily.     propranolol (INDERAL) 20 MG tablet TAKE ONE TABLET 3 TIMES DAILY AS NEEDED FOR TACHYCARDIA 270 tablet 3   ranolazine (RANEXA) 500 MG 12 hr tablet Take 1 tablet (500 mg total) by mouth 2 (two) times daily. 180 tablet 3   vitamin B-12 (CYANOCOBALAMIN) 500 MCG tablet Take 500 mcg by mouth daily.     No current facility-administered medications for this visit.     Allergies:   Patient has no known allergies.   Social History:  The patient  reports that she quit smoking about 39 years ago. Her smoking use included cigarettes. She has a 20.00 pack-year smoking history. She has never used smokeless tobacco. She reports that she does not currently use alcohol. She reports that she does not use drugs.   Family History:   family history includes Heart disease in her father and mother.    Review of Systems: Review of Systems  Constitutional: Negative.   HENT: Negative.    Respiratory: Negative.    Cardiovascular: Negative.   Gastrointestinal: Negative.   Musculoskeletal: Negative.   Neurological: Negative.    Psychiatric/Behavioral: Negative.    All other systems reviewed and are negative.   PHYSICAL EXAM: VS:  BP (!) 120/50 (BP Location: Left Arm, Patient Position: Sitting, Cuff Size: Normal)   Pulse 72   Ht 5' 3.5" (1.613 m)   Wt 121 lb 2 oz (54.9 kg)   SpO2 95%   BMI 21.12 kg/m  , BMI Body mass index is 21.12 kg/m. Constitutional:  oriented to person, place, and time. No distress.  HENT:  Head: Grossly normal Eyes:  no discharge. No scleral icterus.  Neck: No JVD, no carotid bruits  Cardiovascular: Regular rate and rhythm, no murmurs appreciated Pulmonary/Chest: Clear to auscultation bilaterally, no wheezes or rails Abdominal: Soft.  no distension.  no tenderness.  Musculoskeletal: Normal range of motion Neurological:  normal muscle tone. Coordination normal. No atrophy Skin: Skin warm and dry Psychiatric: normal affect, pleasant  Recent Labs: 05/14/2022: Hemoglobin 12.5; Platelets 186 11/24/2022: ALT 11;  BUN 21; Creatinine, Ser 1.56; Potassium 4.5; Sodium 142    Lipid Panel Lab Results  Component Value Date   CHOL 138 01/06/2022   HDL 59.50 01/06/2022   LDLCALC 60 01/06/2022   TRIG 93.0 01/06/2022    Wt Readings from Last 3 Encounters:  01/17/23 121 lb 2 oz (54.9 kg)  11/24/22 123 lb 12.8 oz (56.2 kg)  08/24/22 119 lb 9.6 oz (54.3 kg)     ASSESSMENT AND PLAN:  Essential hypertension -  Blood pressure is well controlled on today's visit. No changes made to the medications.  Coronary artery disease involving native coronary artery of native heart without angina pectoris -  Chronic stable angina Medical management has been relatively successful in suppressing anginal symptoms On today's visit denies any significant change in symptoms  Shortness of breath COPD, chronic angina Reports Hartness of breath symptoms are stable  History of bypass surgery Nitro as needed Previously declined catheterization   Carotid stenosis Less than 39% bilaterally, significant  plaque noted Cholesterol at goal  Hyperlipidemia Cholesterol is at goal on the current lipid regimen. No changes to the medications were made.  Leg swelling Trivial ankle swelling, stable, takes Lasix as needed    Total encounter time more than 30 minutes  Greater than 50% was spent in counseling and coordination of care with the patient   Orders Placed This Encounter  Procedures   EKG 12-Lead     Signed, Dossie Arbour, M.D., Ph.D. 01/17/2023  [facility] [place], [place] [phone]

## 2023-01-17 ENCOUNTER — Encounter: Payer: Self-pay | Admitting: Cardiovascular Disease

## 2023-01-17 ENCOUNTER — Ambulatory Visit
Payer: No Typology Code available for payment source | Attending: Cardiovascular Disease | Admitting: Cardiovascular Disease

## 2023-01-17 ENCOUNTER — Ambulatory Visit: Payer: No Typology Code available for payment source

## 2023-01-17 VITALS — BP 120/50 | HR 72 | Ht 63.5 in | Wt 121.1 lb

## 2023-01-17 DIAGNOSIS — Z951 Presence of aortocoronary bypass graft: Secondary | ICD-10-CM | POA: Diagnosis not present

## 2023-01-17 DIAGNOSIS — I700 Atherosclerosis of aorta: Secondary | ICD-10-CM

## 2023-01-17 DIAGNOSIS — N183 Chronic kidney disease, stage 3 unspecified: Secondary | ICD-10-CM | POA: Diagnosis not present

## 2023-01-17 DIAGNOSIS — I25118 Atherosclerotic heart disease of native coronary artery with other forms of angina pectoris: Secondary | ICD-10-CM

## 2023-01-17 DIAGNOSIS — E785 Hyperlipidemia, unspecified: Secondary | ICD-10-CM | POA: Diagnosis not present

## 2023-01-17 DIAGNOSIS — I5032 Chronic diastolic (congestive) heart failure: Secondary | ICD-10-CM | POA: Diagnosis not present

## 2023-01-17 DIAGNOSIS — I10 Essential (primary) hypertension: Secondary | ICD-10-CM | POA: Diagnosis not present

## 2023-01-17 DIAGNOSIS — I6523 Occlusion and stenosis of bilateral carotid arteries: Secondary | ICD-10-CM

## 2023-01-17 MED ORDER — MECLIZINE HCL 25 MG PO TABS: 25.0000 mg | ORAL_TABLET | Freq: Three times a day (TID) | ORAL | 3 refills | Status: DC | PRN

## 2023-01-17 MED ORDER — MECLIZINE HCL 25 MG PO TABS: 25.0000 mg | ORAL_TABLET | Freq: Three times a day (TID) | ORAL | 2 refills | Status: DC | PRN

## 2023-01-17 NOTE — Patient Instructions (Addendum)
Medication Instructions:  No changes  If you need a refill on your cardiac medications before your next appointment, please call your pharmacy.    Lab work: No new labs needed   Testing/Procedures: No new testing needed   Follow-Up: At [facility], you and your health needs are our priority.  As part of our continuing mission to provide you with exceptional heart care, we have created designated Provider Care Teams.  These Care Teams include your primary Cardiologist (physician) and Advanced Practice Providers (APPs -  Physician Assistants and Nurse Practitioners) who all work together to provide you with the care you need, when you need it.  You will need a follow up appointment in 6 months  Providers on your designated Care Team:   Christopher Berge, NP Ryan Dunn, PA-C Cadence Furth, PA-C  COVID-19 Vaccine Information can be found at: [email or web address] For questions related to vaccine distribution or appointments, please email [email or web address] or call [phone].   

## 2023-01-23 DIAGNOSIS — H353131 Nonexudative age-related macular degeneration, bilateral, early dry stage: Secondary | ICD-10-CM | POA: Diagnosis not present

## 2023-01-23 DIAGNOSIS — H353221 Exudative age-related macular degeneration, left eye, with active choroidal neovascularization: Secondary | ICD-10-CM | POA: Diagnosis not present

## 2023-01-26 ENCOUNTER — Ambulatory Visit: Payer: No Typology Code available for payment source

## 2023-02-01 DIAGNOSIS — D0472 Carcinoma in situ of skin of left lower limb, including hip: Secondary | ICD-10-CM | POA: Diagnosis not present

## 2023-02-02 ENCOUNTER — Ambulatory Visit: Payer: No Typology Code available for payment source

## 2023-02-06 ENCOUNTER — Emergency Department
Admission: EM | Admit: 2023-02-06 | Discharge: 2023-02-07 | Disposition: A | Discharge status: Home / Self Care | Payer: No Typology Code available for payment source | Attending: Emergency Medicine | Admitting: Emergency Medicine

## 2023-02-06 DIAGNOSIS — I2510 Atherosclerotic heart disease of native coronary artery without angina pectoris: Secondary | ICD-10-CM | POA: Diagnosis not present

## 2023-02-06 DIAGNOSIS — E860 Dehydration: Secondary | ICD-10-CM | POA: Insufficient documentation

## 2023-02-06 DIAGNOSIS — I129 Hypertensive chronic kidney disease with stage 1 through stage 4 chronic kidney disease, or unspecified chronic kidney disease: Secondary | ICD-10-CM | POA: Diagnosis not present

## 2023-02-06 DIAGNOSIS — J449 Chronic obstructive pulmonary disease, unspecified: Secondary | ICD-10-CM | POA: Diagnosis not present

## 2023-02-06 DIAGNOSIS — R531 Weakness: Secondary | ICD-10-CM | POA: Diagnosis not present

## 2023-02-06 DIAGNOSIS — R109 Unspecified abdominal pain: Secondary | ICD-10-CM | POA: Diagnosis present

## 2023-02-06 DIAGNOSIS — R001 Bradycardia, unspecified: Secondary | ICD-10-CM | POA: Diagnosis not present

## 2023-02-06 DIAGNOSIS — N390 Urinary tract infection, site not specified: Secondary | ICD-10-CM | POA: Diagnosis not present

## 2023-02-06 DIAGNOSIS — N189 Chronic kidney disease, unspecified: Secondary | ICD-10-CM | POA: Diagnosis not present

## 2023-02-06 DIAGNOSIS — R42 Dizziness and giddiness: Secondary | ICD-10-CM

## 2023-02-06 DIAGNOSIS — I959 Hypotension, unspecified: Secondary | ICD-10-CM | POA: Diagnosis not present

## 2023-02-06 LAB — URINALYSIS, ROUTINE W REFLEX MICROSCOPIC
Bilirubin Urine: NEGATIVE
Glucose, UA: NEGATIVE mg/dL
Hgb urine dipstick: NEGATIVE
Ketones, ur: 5 mg/dL — AB
Nitrite: NEGATIVE
Protein, ur: 100 mg/dL — AB
Specific Gravity, Urine: 1.023 (ref 1.005–1.030)
pH: 5 (ref 5.0–8.0)

## 2023-02-06 LAB — COMPREHENSIVE METABOLIC PANEL
ALT: 10 U/L (ref 0–44)
AST: 25 U/L (ref 15–41)
Albumin: 3.9 g/dL (ref 3.5–5.0)
Alkaline Phosphatase: 58 U/L (ref 38–126)
Anion gap: 8 (ref 5–15)
BUN: 24 mg/dL — ABNORMAL HIGH (ref 8–23)
CO2: 23 mmol/L (ref 22–32)
Calcium: 8.2 mg/dL — ABNORMAL LOW (ref 8.9–10.3)
Chloride: 106 mmol/L (ref 98–111)
Creatinine, Ser: 1.57 mg/dL — ABNORMAL HIGH (ref 0.44–1.00)
GFR, Estimated: 30 mL/min — ABNORMAL LOW (ref >60)
Glucose, Bld: 118 mg/dL — ABNORMAL HIGH (ref 70–99)
Potassium: 4.7 mmol/L (ref 3.5–5.1)
Sodium: 137 mmol/L (ref 135–145)
Total Bilirubin: 1.2 mg/dL (ref 0.3–1.2)
Total Protein: 6.1 g/dL — ABNORMAL LOW (ref 6.5–8.1)

## 2023-02-06 LAB — LIPASE, BLOOD: Lipase: 44 U/L (ref 11–51)

## 2023-02-06 LAB — CBC
HCT: 39.7 % (ref 36.0–46.0)
Hemoglobin: 12.5 g/dL (ref 12.0–15.0)
MCH: 34.3 pg — ABNORMAL HIGH (ref 26.0–34.0)
MCHC: 31.5 g/dL (ref 30.0–36.0)
MCV: 109.1 fL — ABNORMAL HIGH (ref 80.0–100.0)
Platelets: 170 10*3/uL (ref 150–400)
RBC: 3.64 MIL/uL — ABNORMAL LOW (ref 3.87–5.11)
RDW: 11.6 % (ref 11.5–15.5)
WBC: 7.3 10*3/uL (ref 4.0–10.5)
nRBC: 0 % (ref 0.0–0.2)

## 2023-02-06 LAB — TROPONIN I (HIGH SENSITIVITY): Troponin I (High Sensitivity): 12 ng/L (ref <18)

## 2023-02-06 MED ORDER — CEPHALEXIN 500 MG PO CAPS
500.0000 mg | ORAL_CAPSULE | Freq: Once | ORAL | Status: AC
Start: 2023-02-06 — End: 2023-02-06
  Administered 2023-02-06: 500 mg via ORAL
  Filled 2023-02-06: qty 1

## 2023-02-06 MED ORDER — SODIUM CHLORIDE 0.9 % IV BOLUS
500.0000 mL | Freq: Once | INTRAVENOUS | Status: AC
  Administered 2023-02-06: 500 mL via INTRAVENOUS

## 2023-02-06 MED ORDER — CEPHALEXIN 500 MG PO CAPS: 500.0000 mg | ORAL_CAPSULE | Freq: Two times a day (BID) | ORAL | 0 refills | Status: AC

## 2023-02-06 NOTE — ED Provider Notes (Signed)
[facility] Provider Note    Event Date/Time   First MD Initiated Contact with Patient 02/06/23 1649     (approximate)   History   Abdominal Pain   HPI  [name] is a 89 y.o. female with a history of CAD, COPD, CKD, hypertension, and hyperlipidemia who presents with dizziness over the last 3 days, persistent course, worse when she gets up and moves around.  He described as lightheadedness and feel like she is going to pass out.  She denies any sensation of spinning.  She has been treated for vertigo recently but states that this feels different.  The patient denies any vomiting or diarrhea, but has been nauseous.  She states that she did have some upper abdominal pain earlier but that this has completely resolved.  She has no cough or fever.  She has no chest pain or shortness of breath.  She denies any urinary symptoms.  I reviewed the past medical records.  The patient was most recently seen by Dr. Mariah Milling from cardiology on 4/30 of this year for follow-up.  She reported vertigo at that time but did not have any acute cardiac issues.  She has no recent inpatient admissions.   Physical Exam   Triage Vital Signs: ED Triage Vitals  Enc Vitals Group     BP 02/06/23 1515 (!) 122/56     Pulse Rate 02/06/23 1515 77     Resp 02/06/23 1515 17     Temp 02/06/23 1515 98.1 F (36.7 C)     Temp Source 02/06/23 1515 Oral     SpO2 02/06/23 1515 96 %     Weight 02/06/23 1516 121 lb 4.1 oz (55 kg)     Height --      Head Circumference --      Peak Flow --      Pain Score 02/06/23 1516 0     Pain Loc --      Pain Edu? --      Excl. in GC? --     Most recent vital signs: Vitals:   02/06/23 1916 02/06/23 1941  BP: (!) 176/71   Pulse: 75   Resp: 18   Temp:  98.2 F (36.8 C)  SpO2: 94%      General: Awake, no distress, well-appearing for age.  CV:  Good peripheral perfusion.  Resp:  Normal effort.  Lungs CTAB. Abd:  Soft and nontender.  No  distention.  Other:  No significant peripheral edema.  Slightly dry mucous membranes.  EOMI.  PERRLA.  No photophobia.  No pronator drift.  No ataxia on finger-to-nose.  Motor intact in all extremities.   ED Results / Procedures / Treatments   Labs (all labs ordered are listed, but only abnormal results are displayed) Labs Reviewed  COMPREHENSIVE METABOLIC PANEL - Abnormal; Notable for the following components:      Result Value   Glucose, Bld 118 (*)    BUN 24 (*)    Creatinine, Ser 1.57 (*)    Calcium 8.2 (*)    Total Protein 6.1 (*)    GFR, Estimated 30 (*)    All other components within normal limits  CBC - Abnormal; Notable for the following components:   RBC 3.64 (*)    MCV 109.1 (*)    MCH 34.3 (*)    All other components within normal limits  URINALYSIS, ROUTINE W REFLEX MICROSCOPIC - Abnormal; Notable for the following components:   Color, Urine  AMBER (*)    APPearance HAZY (*)    Ketones, ur 5 (*)    Protein, ur 100 (*)    Leukocytes,Ua SMALL (*)    Bacteria, UA RARE (*)    All other components within normal limits  LIPASE, BLOOD  TROPONIN I (HIGH SENSITIVITY)     EKG  ED ECG REPORT I, Dionne Bucy, the attending physician, personally viewed and interpreted this ECG.  Date: 02/06/2023 EKG Time: 1520 Rate: 75 Rhythm: normal sinus rhythm (read by machine as accelerated junctional rhythm but appears sinus) QRS Axis: normal Intervals: normal ST/T Wave abnormalities: Nonspecific T wave abnormalities Narrative Interpretation: no evidence of acute ischemia    RADIOLOGY   PROCEDURES:  Critical Care performed: No  Procedures   MEDICATIONS ORDERED IN ED: Medications  sodium chloride 0.9 % bolus 500 mL (0 mLs Intravenous Stopped 02/06/23 1910)  cephALEXin (KEFLEX) capsule 500 mg (500 mg Oral Given 02/06/23 1940)     IMPRESSION / MDM / ASSESSMENT AND PLAN / ED COURSE  I reviewed the triage vital signs and the nursing notes.  89 year old female  with PMH as noted above presents with lightheadedness and near syncope which seems to be orthostatic in nature.  She denies any vertigo symptoms, and she did report an episode of abdominal pain that resolved after a bowel movement.  She has no abdominal pain currently.  Physical exam is unremarkable for acute findings.  Differential diagnosis includes, but is not limited to, dehydration, electrolyte abnormality, AKI, other metabolic disturbance, anemia, cardiac etiology, UTI or other infection.  We will give a fluid bolus, obtain lab workup, and reassess.  Patient's presentation is most consistent with acute presentation with potential threat to life or bodily function.  The patient is on the cardiac monitor to evaluate for evidence of arrhythmia and/or significant heart rate changes.  ----------------------------------------- 7:27 PM on 02/06/2023 -----------------------------------------  The patient is feeling significantly better after fluids.  Lab workup is overall reassuring although the urinalysis does show findings consistent with a mild UTI.  Creatinine is mildly elevated from baseline.  There is no leukocytosis.  Other labs are unremarkable.  Troponin is negative.  The patient states that she would strongly prefer to go home.  We checked orthostatic vital signs.  Although the patient's blood pressure did drop slightly when sitting, it was normal and standing.  The patient was completely asymptomatic.  I did offer her admission for further hydration and antibiotics but she would like to go home.  I gave a dose of Keflex here and prescribe the same for home.  I counseled her on the results of the workup and plan of care.  I gave strict return precautions and she expressed understanding.  FINAL CLINICAL IMPRESSION(S) / ED DIAGNOSES   Final diagnoses:  Urinary tract infection without hematuria, site unspecified  Lightheadedness  Dehydration     Rx / DC Orders   ED Discharge Orders           Ordered    cephALEXin (KEFLEX) 500 MG capsule  2 times daily        02/06/23 1926             Note:  This document was prepared using Dragon voice recognition software and may include unintentional dictation errors.    Dionne Bucy, MD 02/07/23 [phone]

## 2023-02-06 NOTE — Discharge Instructions (Addendum)
Take the Keflex as prescribed and finish the full course.  Follow-up with your primary care provider.  Make sure to drink plenty of fluids over the next few days.  Return to the ER for new, worsening, or persistent severe dizziness, weakness, lightheadedness, vomiting, abdominal pain, fever, or any other new or worsening symptoms that concern you.

## 2023-02-06 NOTE — ED Triage Notes (Signed)
Pt sts that she woke up this AM with abd pain that than changed into dizziness and nausea. Pt sts that she is concerned about falling.

## 2023-02-09 ENCOUNTER — Ambulatory Visit: Payer: No Typology Code available for payment source | Attending: Internal Medicine

## 2023-02-09 DIAGNOSIS — R262 Difficulty in walking, not elsewhere classified: Secondary | ICD-10-CM | POA: Insufficient documentation

## 2023-02-09 DIAGNOSIS — R2689 Other abnormalities of gait and mobility: Secondary | ICD-10-CM | POA: Diagnosis not present

## 2023-02-09 DIAGNOSIS — R269 Unspecified abnormalities of gait and mobility: Secondary | ICD-10-CM | POA: Diagnosis not present

## 2023-02-09 DIAGNOSIS — R2681 Unsteadiness on feet: Secondary | ICD-10-CM | POA: Insufficient documentation

## 2023-02-09 DIAGNOSIS — R531 Weakness: Secondary | ICD-10-CM | POA: Insufficient documentation

## 2023-02-09 DIAGNOSIS — R278 Other lack of coordination: Secondary | ICD-10-CM | POA: Insufficient documentation

## 2023-02-09 DIAGNOSIS — M6281 Muscle weakness (generalized): Secondary | ICD-10-CM | POA: Diagnosis not present

## 2023-02-09 NOTE — Therapy (Signed)
OUTPATIENT PHYSICAL THERAPY THORACOLUMBAR TREATMENT/RE-CERTIFICATION   Patient Name: [name] MRN: [id number] DOB:1925/10/01, 89 y.o., female Today's Date: 02/09/2023  END OF SESSION:  PT End of Session - 02/09/23 1306     Visit Number 9    Number of Visits 17    Date for PT Re-Evaluation 04/06/23    Authorization - Visit Number 9    Authorization - Number of Visits 33    Progress Note Due on Visit 10    PT Start Time 1305    PT Stop Time 1345    PT Time Calculation (min) 40 min    Activity Tolerance Patient tolerated treatment well    Behavior During Therapy [facility] for tasks assessed/performed              Past Medical History:  Diagnosis Date   C. difficile colitis    CAD (coronary artery disease)    a. s/p CABG 1999; b. Nuclear 10/11, no scar or ischemia, EF 68%; b. Lexiscan Myoview 06/18/14: no evidence of infarction or ischemia, EF 77%, low risk study; c. 02/2019 MV: EF 56%, ? mild lat ischemia->low risk.   Carotid artery disease (HCC)    a. Doppler January, 2012, 40-59% bilateral; b. 02/2017 U/S: 1-39% bilat ICA stenosis.   Closed fracture pubis (HCC) 07/04/2017   COPD (chronic obstructive pulmonary disease) (HCC)    Diffuse cystic mastopathy    Diverticulitis    last colonoscopy  incomplete Jan 2011   Dizziness    stable now (Oct 2011) patient tells me question of TIA, we will obtain records   Gait abnormality    Patient complains of "wobbly gait", December, 2013   GERD (gastroesophageal reflux disease)    History of migraines    Hx of CABG    a. 3 vessel in 1999   Hyperlipidemia    Hypertension    Indigestion    3 weeks, October 2011   Kidney stones    Rheumatic fever    SCC (squamous cell carcinoma), face 08/01/2016   Sinus bradycardia    Mild, December, 2013   SOB (shortness of breath)    Syncopal episodes    after 18 holes of golf and increase hydrochlorothiazide, resolved    Past Surgical History:  Procedure Laterality Date    APPENDECTOMY  1950   BREAST BIOPSY Right 1994   BREAST CYST ASPIRATION     multiple BIL   CATARACT EXTRACTION Right 2009   CATARACT EXTRACTION Left 2011   COLONOSCOPY  4098,1191   Dr. 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   Oral thrush 11/22/2021   Mild cognitive impairment with memory loss 10/12/2021   Hyperparathyroidism due to renal insufficiency (HCC) 02/02/2021   CKD (chronic kidney disease) stage 3, GFR 30-59 ml/min (HCC) 01/15/2021   Grief 04/08/2020   History of esophageal stricture 04/08/2020   Leg swelling 03/09/2020   Atypical chest pain 01/10/2020   B12 deficiency 01/09/2020   Generalized weakness 07/14/2019   Mild  malnutrition (HCC) 03/25/2019   Diastolic CHF, chronic (HCC) 03/25/2019   Vitamin D deficiency 03/25/2019   S/P excision of skin lesion, follow-up exam 12/26/2017   Impingement syndrome of left shoulder region 07/04/2017   Allergic rhinitis 12/31/2016   Osteoporosis of forearm 02/13/2016   Insomnia secondary to anxiety 07/12/2015   Anxiety disorder 07/12/2015   Essential (primary) hypertension 10/07/2014   Sinus bradycardia    Hardening of the aorta (main artery of the heart) (HCC) 06/26/2014   History of Clostridium difficile colitis 10/20/2013   History of pancreatitis 10/20/2013   Low back pain 10/04/2013   Do not resuscitate discussion 02/07/2013   Chest pain at rest 12/26/2011   COPD, moderate (HCC) 12/05/2011   Presence of aortocoronary bypass graft    Stage  3b chronic kidney disease (HCC) 09/25/2011   Hyperlipidemia    SOB (shortness of breath)    Coronary artery disease of native artery of native heart with stable angina pectoris (HCC)    Carotid artery disease (HCC)     PCP: Duncan Dull MD  REFERRING PROVIDER: Duncan Dull MD  REFERRING DIAG: unsteadiness  Rationale for Evaluation and Treatment: Rehabilitation  THERAPY DIAG:  Unsteadiness on feet - Plan: PT plan of care cert/re-cert  Muscle weakness (generalized) - Plan: PT plan of care cert/re-cert  Generalized weakness - Plan: PT plan of care cert/re-cert  Abnormality of gait and mobility - Plan: PT plan of care cert/re-cert  Difficulty in walking, not elsewhere classified - Plan: PT plan of care cert/re-cert  Other lack of coordination - Plan: PT plan of care cert/re-cert  Other abnormalities of gait and mobility - Plan: PT plan of care cert/re-cert  ONSET DATE: ~57months  SUBJECTIVE:                                                                                                                                                                                             SUBJECTIVE STATEMENT:   Pt states she is feeling weak and wobbly as the normal.  She states she has been much more wobbly over the past week or so since being being in the hospital with a UTI.  Pt unsure if she should even be at therapy at this time.      PERTINENT HISTORY: Pt is a 89 y.o. female who presents with unsteadiness on feet that began about 4 months ago. Pt reports she started to feel "wobbly and unsteady". MD told pt she has generalized weakness and her response was "that's just what happens when you're this old". Unsteadiness on feet ISQ since onset. Ambulates independently without an AD. No falls in  the last 6 months. Pt owns a SPC but does not use it. Pt lives alone in a 2-story house close to the clinic and her daughter visits very often. Her room is upstairs; she walks up ~10 steps holding  onto the L handrail with both hands to get to her room. Pt drives independently with no issues. Pt remains independent with all ADLs including grocery shopping with the exception of big loads (her daughter helps her out with that), cleaning (she has help for that), cooking ("not because I can't but because I don't want to do it"). PMH: heart surgery in 1999, COPD, SOB intermittently. Pt denies N/V, B&B changes, unexplained weight fluctuation, saddle paresthesia, fever, night sweats, or unrelenting night pain at this time. No falls in past 6 months.   Pt accompanied by: self     PAIN:  Are you having pain? No  PRECAUTIONS: Other: Fall risk  WEIGHT BEARING RESTRICTIONS: No  FALLS: Has patient fallen in last 6 months? No  LIVING ENVIRONMENT: Lives with: lives with their family and lives alone Lives in: House/apartment Stairs: Yes: Internal: 10 steps; on left going up and External: 3-4 steps; can reach both, walkway to front door - 3-4 steps; can reach both Has following equipment at home: Single point cane  PLOF: Independent  PATIENT GOALS: Pt does not want to feel wobbly. Wants to feel more stable and prevent the risk of falls.  OBJECTIVE:   BP:  prior to - 137/57; after - 145/98 : 765 feet with minimal CGA Grip strength - L: 25.33 lbs R: 30.67 lbs   DIAGNOSTIC FINDINGS: N/A  COGNITION: Overall cognitive status: Within functional limits for tasks assessed   SENSATION: WFL  COORDINATION: Slowed coordination  EDEMA: N/A   MUSCLE TONE:  Decreased muscle tone bilaterally  MUSCLE LENGTH: Not assessed  DTRs:   Not assessed   POSTURE: No significant postural limitations  LOWER EXTREMITY ROM:     Active  Right Eval Left Eval  Hip flexion 102 102  Hip extension 120 120  Hip abduction    Hip adduction    Hip internal rotation    Hip external rotation    Knee flexion    Knee extension    Ankle dorsiflexion    Ankle plantarflexion    Ankle  inversion    Ankle eversion     (Blank rows = not tested)  LOWER EXTREMITY MMT:    MMT Right Eval Left Eval  Hip flexion 3 3  Hip extension 3 3  Hip abduction 3 3  Hip adduction    Hip internal rotation    Hip external rotation    Knee flexion    Knee extension    Ankle dorsiflexion    Ankle plantarflexion    Ankle inversion    Ankle eversion    (Blank rows = not tested)  BED MOBILITY:  Sit to supine Complete Independence Supine to sit Complete Independence Pt uses walker for bed transfer   TRANSFERS: Assistive device utilized: None  Sit to stand: Modified independence Stand to sit: Used bilateral UE to stand Floor: Complete Independence   STAIRS:  Deferred to next session  GAIT: Gait pattern: decreased step length- Left, decreased stride length, decreased hip/knee flexion- Right, decreased hip/knee flexion- Left, decreased ankle dorsiflexion- Right, decreased ankle dorsiflexion- Left, shuffling, narrow BOS, poor foot clearance- Right, and poor foot clearance- Left Distance walked: 10 meters Assistive device utilized: None Level of assistance: Complete Independence Comments: shuffling gait pattern  FUNCTIONAL TESTS:  5 times sit to stand: 18.09 secs (pt used arms to push off her quads to assist in transfer) Timed up and go (TUG): 15.44 secs 10 meter walk test: self-selected - 0.83 m/s; fastest - 1.0 m/s Dynamic Gait Index: 15/24  PATIENT SURVEYS:  FOTO 41  TODAY'S TREATMENT: DATE: 02/09/23   Neuro:  Goal assessment performed and noted below as part of re-certification of pt.    Vestibular Screen: oculomotor range of motion-intact, smooth pursuits-intact, 2 saccades noted, end range nystagmus-negative right and noted with left gaze nystagmus which might be age appropriate, saccades-abnormal with corrective undershoots noted. Pt AROM cervical spine WFL. Attempted head impulse test but patient with guarding and unable to relax enough to test. Head shaking  nystagmus test- pt reports dizziness symptoms but no nystagmus was observed.  Pt describes her dizziness as imbalance and denies vertigo. Pt reports that her imbalance increased after her most recent hospitalization.    PATIENT EDUCATION: Education details: Pt was educated on diagnosis, anatomy and pathology involved, prognosis, role of PT, and was given an HEP, demonstrating exercises with proper form following verbal and tactile cues, and was given a paper hand out to continue exercise at home. Pt was educated on and agreed to plan of care Person educated: Patient Education method: Explanation, Demonstration, Tactile cues, Verbal cues, and Handouts Education comprehension: verbalized understanding, returned demonstration, and verbal cues required  HOME EXERCISE PROGRAM: Standing marches - 1 x weekly - 2 sets - 8-12 reps Standing hip abduction with counter support - 1 x weekly  HS stretch - 3 x 30 seconds 2x/day    ASSESSMENT:  CLINICAL IMPRESSION:  Pt performed well with the goal assessment and has not made a drastic decline as expected due to the lack of therapy over the past few weeks.  Pt currently has saccadic movement in the eyes, which the pt would benefit from having updated HEP to include exercises that will challenge and assist her dizziness symptoms.  Patient's condition has the potential to improve in response to therapy. Maximum improvement is yet to be obtained. The anticipated improvement is attainable and reasonable in a generally predictable time.   Pt will continue to benefit from skilled therapy to address remaining deficits in order to improve overall QoL and return to PLOF.        OBJECTIVE IMPAIRMENTS: Abnormal gait, decreased activity tolerance, decreased balance, decreased coordination, decreased endurance, decreased mobility, difficulty walking, decreased ROM, decreased strength, increased fascial restrictions, impaired flexibility, improper body mechanics, and  postural dysfunction.   ACTIVITY LIMITATIONS: carrying, lifting, bending, squatting, stairs, transfers, bed mobility, dressing, and hygiene/grooming  PARTICIPATION LIMITATIONS: meal prep, cleaning, laundry, shopping, and community activity  PERSONAL FACTORS: Age, Education, Fitness, Past/current experiences, Time since onset of injury/illness/exacerbation, and 3+ comorbidities: COPD, CAD, OA  are also affecting patient's functional outcome.   REHAB POTENTIAL: Good  CLINICAL DECISION MAKING: Evolving/moderate complexity  EVALUATION COMPLEXITY: Moderate   GOALS: Goals reviewed with patient? Yes  SHORT TERM GOALS: Target date: 11/24/22  Pt will be independent with HEP in order to improve strength and balance in order to decrease fall risk and improve function at home and work. Baseline: 10/26/22 HEP given  Goal status: INITIAL    LONG TERM GOALS: Target date: 01/08/23  Pt will demonstrate speed of at least 1.0 m/s to demonstrate decreased fall risk and increased independence in community ambulation Baseline: Baseline: self-selected - 0.83 m/s; fastest - 0.91 m/s 02/09/23: self-selected - 0.67 m/s; fastest -  0.93 m/s Goal status: IN PROGRESS  2.  Pt will increase FOTO score to 53 to demonstrate predicted increase in functional mobility to complete ADLs Baseline: 48 Goal status: INITIAL  3.  Pt will ascend 10 steps only using LUE to hold onto the L handrail for increased independence to complete ADLs Baseline: Unsteadiness with 10 steps and requires both hands on L handrail 02/09/23: Pt still requires both handrails for support Goal status: IN PROGRESS  4.  Pt will demonstrate TUG of 13 secs or less in order to demonstrate decreased fall risk Baseline: 18.09 secs 02/09/23: 10.91 Goal status: MET  5.  Pt will improve DGI by to least 19/24 in order to demonstrate clinically significant improvement in balance for decreased risk for falls Baseline: 15/24 02/09/23: 13/24 Goal  status: IN PROGRESS  6.  Pt will decrease 5TSTS to at least 13 seconds in order to demonstrate clinically significant improvement in LE strength to age matched norms, needed for increased independence with basic transfers Baseline: 10/26/22 18.09 secs (pt used arms to push off her quads to assist in transfer) 02/09/23: 15.29 sec (pt minimally uses her UE's to push through quads in transfer) Goal status: IN PROGRESS     PLAN:  PT FREQUENCY: 1-2x/week  PT DURATION: 8 weeks  PLANNED INTERVENTIONS: Therapeutic exercises, Therapeutic activity, Neuromuscular re-education, Balance training, Gait training, Patient/Family education, Self Care, Joint mobilization, Joint manipulation, Stair training, DME instructions, Aquatic Therapy, Dry Needling, Electrical stimulation, Spinal manipulation, Spinal mobilization, Cryotherapy, Moist heat, Traction, Ultrasound, Ionotophoresis 4mg /ml Dexamethasone, Manual therapy, and Re-evaluation.  PLAN FOR NEXT SESSION:   Update HEP. Test grip strength. Test stairs. Incorporate dynamic stabilization with ramps and curbs.     Nolon Bussing, PT, DPT Physical Therapist - [facility]  02/09/23, 5:05 PM

## 2023-02-16 ENCOUNTER — Ambulatory Visit: Payer: No Typology Code available for payment source

## 2023-02-16 DIAGNOSIS — R2681 Unsteadiness on feet: Secondary | ICD-10-CM | POA: Diagnosis not present

## 2023-02-16 DIAGNOSIS — M6281 Muscle weakness (generalized): Secondary | ICD-10-CM

## 2023-02-16 DIAGNOSIS — R262 Difficulty in walking, not elsewhere classified: Secondary | ICD-10-CM

## 2023-02-16 DIAGNOSIS — R531 Weakness: Secondary | ICD-10-CM

## 2023-02-16 DIAGNOSIS — R269 Unspecified abnormalities of gait and mobility: Secondary | ICD-10-CM

## 2023-02-16 NOTE — Therapy (Signed)
OUTPATIENT PHYSICAL THERAPY THORACOLUMBAR TREATMENT/PHYSICAL THERAPY PROGRESS NOTE   Dates of reporting period  10/25/22   to   02/16/23    Patient Name: [name] MRN: [id number] DOB:[birth year], 89 y.o., female Today's Date: 02/16/2023  END OF SESSION:  PT End of Session - 02/16/23 1431     Visit Number 10    Number of Visits 17    Date for PT Re-Evaluation 04/06/23    Authorization - Visit Number 10    Authorization - Number of Visits 33    Progress Note Due on Visit 10    PT Start Time 1435    PT Stop Time 1515    PT Time Calculation (min) 40 min    Activity Tolerance Patient tolerated treatment well    Behavior During Therapy [facility] for tasks assessed/performed              Past Medical History:  Diagnosis Date   C. difficile colitis    CAD (coronary artery disease)    a. s/p CABG 1999; b. Nuclear 10/11, no scar or ischemia, EF 68%; b. Lexiscan Myoview 06/18/14: no evidence of infarction or ischemia, EF 77%, low risk study; c. 02/2019 MV: EF 56%, ? mild lat ischemia->low risk.   Carotid artery disease (HCC)    a. Doppler January, 2012, 40-59% bilateral; b. 02/2017 U/S: 1-39% bilat ICA stenosis.   Closed fracture pubis (HCC) 07/04/2017   COPD (chronic obstructive pulmonary disease) (HCC)    Diffuse cystic mastopathy    Diverticulitis    last colonoscopy  incomplete Jan 2011   Dizziness    stable now (Oct 2011) patient tells me question of TIA, we will obtain records   Gait abnormality    Patient complains of "wobbly gait", December, 2013   GERD (gastroesophageal reflux disease)    History of migraines    Hx of CABG    a. 3 vessel in 1999   Hyperlipidemia    Hypertension    Indigestion    3 weeks, October 2011   Kidney stones    Rheumatic fever    SCC (squamous cell carcinoma), face 08/01/2016   Sinus bradycardia    Mild, December, 2013   SOB (shortness of breath)    Syncopal episodes    after 18 holes of golf and increase hydrochlorothiazide,  resolved    Past Surgical History:  Procedure Laterality Date   APPENDECTOMY  1950   BREAST BIOPSY Right 1994   BREAST CYST ASPIRATION     multiple BIL   CATARACT EXTRACTION Right 2009   CATARACT EXTRACTION Left 2011   COLONOSCOPY  4098,1191   Dr. 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   Oral thrush 11/22/2021   Mild cognitive impairment with memory loss 10/12/2021   Hyperparathyroidism due to renal insufficiency (HCC) 02/02/2021   CKD (chronic kidney disease) stage 3, GFR 30-59 ml/min (HCC) 01/15/2021   Grief 04/08/2020   History of esophageal stricture 04/08/2020   Leg swelling 03/09/2020  Atypical chest pain 01/10/2020   B12 deficiency 01/09/2020   Generalized weakness 07/14/2019   Mild malnutrition (HCC) 03/25/2019   Diastolic CHF, chronic (HCC) 03/25/2019   Vitamin D deficiency 03/25/2019   S/P excision of skin lesion, follow-up exam 12/26/2017   Impingement syndrome of left shoulder region 07/04/2017   Allergic rhinitis 12/31/2016   Osteoporosis of forearm 02/13/2016   Insomnia secondary to anxiety 07/12/2015   Anxiety disorder 07/12/2015   Essential (primary) hypertension 10/07/2014   Sinus bradycardia    Hardening of the aorta (main artery of the heart) (HCC) 06/26/2014   History of Clostridium difficile colitis 10/20/2013   History of pancreatitis 10/20/2013   Low back pain 10/04/2013   Do not resuscitate discussion 02/07/2013   Chest pain at rest 12/26/2011   COPD, moderate  (HCC) 12/05/2011   Presence of aortocoronary bypass graft    Stage 3b chronic kidney disease (HCC) 09/25/2011   Hyperlipidemia    SOB (shortness of breath)    Coronary artery disease of native artery of native heart with stable angina pectoris (HCC)    Carotid artery disease (HCC)     PCP: Duncan Dull MD  REFERRING PROVIDER: Duncan Dull MD  REFERRING DIAG: unsteadiness  Rationale for Evaluation and Treatment: Rehabilitation  THERAPY DIAG:  Unsteadiness on feet  Muscle weakness (generalized)  Generalized weakness  Abnormality of gait and mobility  Difficulty in walking, not elsewhere classified  ONSET DATE: ~27months  SUBJECTIVE:                                                                                                                                                                                             SUBJECTIVE STATEMENT:   Pt reports she is still feeling weak and dizzy which is consistent with her normal response when arriving to therapy.  Pt denies any falls since the last visit.  Pt notes that her family is moving her bedroom to the bottom floor to assist with preventing falls.    PERTINENT HISTORY: Pt is a 89 y.o. female who presents with unsteadiness on feet that began about 4 months ago. Pt reports she started to feel "wobbly and unsteady". MD told pt she has generalized weakness and her response was "that's just what happens when you're this old". Unsteadiness on feet ISQ since onset. Ambulates independently without an AD. No falls in the last 6 months. Pt owns a SPC but does not use it. Pt lives alone in a 2-story house close to the clinic and her daughter visits very often. Her room is upstairs; she walks up ~10 steps holding onto the L handrail with both  hands to get to her room. Pt drives independently with no issues. Pt remains independent with all ADLs including grocery shopping with the exception of big loads (her daughter helps her out with that),  cleaning (she has help for that), cooking ("not because I can't but because I don't want to do it"). PMH: heart surgery in 1999, COPD, SOB intermittently. Pt denies N/V, B&B changes, unexplained weight fluctuation, saddle paresthesia, fever, night sweats, or unrelenting night pain at this time. No falls in past 6 months.   Pt accompanied by: self     PAIN:  Are you having pain? No  PRECAUTIONS: Other: Fall risk  WEIGHT BEARING RESTRICTIONS: No  FALLS: Has patient fallen in last 6 months? No  LIVING ENVIRONMENT: Lives with: lives with their family and lives alone Lives in: House/apartment Stairs: Yes: Internal: 10 steps; on left going up and External: 3-4 steps; can reach both, walkway to front door - 3-4 steps; can reach both Has following equipment at home: Single point cane  PLOF: Independent  PATIENT GOALS: Pt does not want to feel wobbly. Wants to feel more stable and prevent the risk of falls.  OBJECTIVE:   BP:  prior to - 137/57; after - 145/98 : 765 feet with minimal CGA Grip strength - L: 25.33 lbs R: 30.67 lbs   DIAGNOSTIC FINDINGS: N/A  COGNITION: Overall cognitive status: Within functional limits for tasks assessed   SENSATION: WFL  COORDINATION: Slowed coordination  EDEMA: N/A   MUSCLE TONE:  Decreased muscle tone bilaterally  MUSCLE LENGTH: Not assessed  DTRs:   Not assessed   POSTURE: No significant postural limitations  LOWER EXTREMITY ROM:     Active  Right Eval Left Eval  Hip flexion 102 102  Hip extension 120 120  Hip abduction    Hip adduction    Hip internal rotation    Hip external rotation    Knee flexion    Knee extension    Ankle dorsiflexion    Ankle plantarflexion    Ankle inversion    Ankle eversion     (Blank rows = not tested)  LOWER EXTREMITY MMT:    MMT Right Eval Left Eval  Hip flexion 3 3  Hip extension 3 3  Hip abduction 3 3  Hip adduction    Hip internal rotation    Hip external  rotation    Knee flexion    Knee extension    Ankle dorsiflexion    Ankle plantarflexion    Ankle inversion    Ankle eversion    (Blank rows = not tested)  BED MOBILITY:  Sit to supine Complete Independence Supine to sit Complete Independence Pt uses walker for bed transfer   TRANSFERS: Assistive device utilized: None  Sit to stand: Modified independence Stand to sit: Used bilateral UE to stand Floor: Complete Independence   STAIRS:  Deferred to next session  GAIT: Gait pattern: decreased step length- Left, decreased stride length, decreased hip/knee flexion- Right, decreased hip/knee flexion- Left, decreased ankle dorsiflexion- Right, decreased ankle dorsiflexion- Left, shuffling, narrow BOS, poor foot clearance- Right, and poor foot clearance- Left Distance walked: 10 meters Assistive device utilized: None Level of assistance: Complete Independence Comments: shuffling gait pattern  FUNCTIONAL TESTS:  5 times sit to stand: 18.09 secs (pt used arms to push off her quads to assist in transfer) Timed up and go (TUG): 15.44 secs 10 meter walk test: self-selected - 0.83 m/s; fastest - 1.0 m/s Dynamic Gait Index: 15/24  PATIENT SURVEYS:  FOTO 41  TODAY'S TREATMENT: DATE: 02/16/23   Neuro:  Standing lateral VOR x1, 10 head turns to each side and 10 head nods up/down Patient ambulated 10 min outdoor today on varied surfaces: concrete, brick, asphalt, mulch, grass, up ramp, inclines, declines- with 1 seated rest break(s) - CGA for all indoor/outdoor - no instance of instability when ambulating.   Focusing on overall endurance and to improve ambulation across uneven terrain that pt would come in contact with in the community.   TherEx:  NuStep, seat 7, arms 7, level 1 for 6 minutes for gentle muscular endurance and gathering subjective information STS x10 Seated marches with RTB resistance, x10 each LE Seated hamstring curls with RTB resistance, x10 each LE Seated hip  abduction with RTB resistance, x10 each LE       PATIENT EDUCATION: Education details: Pt was educated on diagnosis, anatomy and pathology involved, prognosis, role of PT, and was given an HEP, demonstrating exercises with proper form following verbal and tactile cues, and was given a paper hand out to continue exercise at home. Pt was educated on and agreed to plan of care Person educated: Patient Education method: Explanation, Demonstration, Tactile cues, Verbal cues, and Handouts Education comprehension: verbalized understanding, returned demonstration, and verbal cues required  HOME EXERCISE PROGRAM: Standing marches - 1 x weekly - 2 sets - 8-12 reps Standing hip abduction with counter support - 1 x weekly  HS stretch - 3 x 30 seconds 2x/day    ASSESSMENT:  CLINICAL IMPRESSION:  Pt goals assessed at previous visit, therefore were not addressed at this time and are found below.  Pt continues to describe significant dizziness so she was introduced to some neuro related items such as VOR in order to assist with her dizziness.  Pt responded well to that technique and ambulation outside.  Pt moves well outside across most surfaces, only having some difficulty with mulch due to it being soft and moving while ambulating.  Pt otherwise performed well and will continue to benefit from balance training at future sessions.   Pt will continue to benefit from skilled therapy to address remaining deficits in order to improve overall QoL and return to PLOF.         OBJECTIVE IMPAIRMENTS: Abnormal gait, decreased activity tolerance, decreased balance, decreased coordination, decreased endurance, decreased mobility, difficulty walking, decreased ROM, decreased strength, increased fascial restrictions, impaired flexibility, improper body mechanics, and postural dysfunction.   ACTIVITY LIMITATIONS: carrying, lifting, bending, squatting, stairs, transfers, bed mobility, dressing, and  hygiene/grooming  PARTICIPATION LIMITATIONS: meal prep, cleaning, laundry, shopping, and community activity  PERSONAL FACTORS: Age, Education, Fitness, Past/current experiences, Time since onset of injury/illness/exacerbation, and 3+ comorbidities: COPD, CAD, OA  are also affecting patient's functional outcome.   REHAB POTENTIAL: Good  CLINICAL DECISION MAKING: Evolving/moderate complexity  EVALUATION COMPLEXITY: Moderate   GOALS: Goals reviewed with patient? Yes  SHORT TERM GOALS: Target date: 11/24/22  Pt will be independent with HEP in order to improve strength and balance in order to decrease fall risk and improve function at home and work. Baseline: 10/26/22 HEP given  Goal status: INITIAL    LONG TERM GOALS: Target date: 01/08/23  Pt will demonstrate speed of at least 1.0 m/s to demonstrate decreased fall risk and increased independence in community ambulation Baseline: Baseline: self-selected - 0.83 m/s; fastest - 0.91 m/s 02/09/23: self-selected - 0.67 m/s; fastest - 0.93 m/s Goal status: IN PROGRESS  2.  Pt will increase FOTO score  to 53 to demonstrate predicted increase in functional mobility to complete ADLs Baseline: 48 Goal status: INITIAL  3.  Pt will ascend 10 steps only using LUE to hold onto the L handrail for increased independence to complete ADLs Baseline: Unsteadiness with 10 steps and requires both hands on L handrail 02/09/23: Pt still requires both handrails for support Goal status: IN PROGRESS  4.  Pt will demonstrate TUG of 13 secs or less in order to demonstrate decreased fall risk Baseline: 18.09 secs 02/09/23: 10.91 Goal status: MET  5.  Pt will improve DGI by to least 19/24 in order to demonstrate clinically significant improvement in balance for decreased risk for falls Baseline: 15/24 02/09/23: 13/24 Goal status: IN PROGRESS  6.  Pt will decrease 5TSTS to at least 13 seconds in order to demonstrate clinically significant improvement in LE  strength to age matched norms, needed for increased independence with basic transfers Baseline: 10/26/22 18.09 secs (pt used arms to push off her quads to assist in transfer) 02/09/23: 15.29 sec (pt minimally uses her UE's to push through quads in transfer) Goal status: IN PROGRESS     PLAN:  PT FREQUENCY: 1-2x/week  PT DURATION: 8 weeks  PLANNED INTERVENTIONS: Therapeutic exercises, Therapeutic activity, Neuromuscular re-education, Balance training, Gait training, Patient/Family education, Self Care, Joint mobilization, Joint manipulation, Stair training, DME instructions, Aquatic Therapy, Dry Needling, Electrical stimulation, Spinal manipulation, Spinal mobilization, Cryotherapy, Moist heat, Traction, Ultrasound, Ionotophoresis 4mg /ml Dexamethasone, Manual therapy, and Re-evaluation.  PLAN FOR NEXT SESSION:   Update HEP. Test grip strength. Test stairs. Incorporate dynamic stabilization with ramps and curbs.     Nolon Bussing, PT, DPT Physical Therapist - [facility]  02/16/23, 5:00 PM

## 2023-02-20 ENCOUNTER — Ambulatory Visit: Payer: No Typology Code available for payment source

## 2023-02-20 VITALS — Ht 63.5 in | Wt 121.0 lb

## 2023-02-20 DIAGNOSIS — Z0000 Encounter for general adult medical examination without abnormal findings: Secondary | ICD-10-CM

## 2023-02-20 NOTE — Patient Instructions (Signed)
[name] , Thank you for taking time to come for your Medicare Wellness Visit. I appreciate your ongoing commitment to your health goals. Please review the following plan we discussed and let me know if I can assist you in the future.   These are the goals we discussed:  Goals      DIET - EAT MORE FRUITS AND VEGETABLES     Follow up with Primary Care Provider     As needed.        This is a list of the screening recommended for you and due dates:  Health Maintenance  Topic Date Due   DTaP/Tdap/Td vaccine (2 - Td or Tdap) 02/08/2023   Flu Shot  04/20/2023   Medicare Annual Wellness Visit  02/20/2024   Pneumonia Vaccine  Completed   DEXA scan (bone density measurement)  Completed   COVID-19 Vaccine  Completed   Zoster (Shingles) Vaccine  Completed   HPV Vaccine  Aged Out    Advanced directives: yes  Conditions/risks identified: none  Next appointment: Follow up in one year for your annual wellness visit 02/21/24 @ 10:45 am by phone   Preventive Care 65 Years and Older, Female Preventive care refers to lifestyle choices and visits with your health care provider that can promote health and wellness. What does preventive care include? A yearly physical exam. This is also called an annual well check. Dental exams once or twice a year. Routine eye exams. Ask your health care provider how often you should have your eyes checked. Personal lifestyle choices, including: Daily care of your teeth and gums. Regular physical activity. Eating a healthy diet. Avoiding tobacco and drug use. Limiting alcohol use. Practicing safe sex. Taking low-dose aspirin every day. Taking vitamin and mineral supplements as recommended by your health care provider. What happens during an annual well check? The services and screenings done by your health care provider during your annual well check will depend on your age, overall health, lifestyle risk factors, and family history of  disease. Counseling  Your health care provider may ask you questions about your: Alcohol use. Tobacco use. Drug use. Emotional well-being. Home and relationship well-being. Sexual activity. Eating habits. History of falls. Memory and ability to understand (cognition). Work and work Astronomer. Reproductive health. Screening  You may have the following tests or measurements: Height, weight, and BMI. Blood pressure. Lipid and cholesterol levels. These may be checked every 5 years, or more frequently if you are over 25 years old. Skin check. Lung cancer screening. You may have this screening every year starting at age 56 if you have a 30-pack-year history of smoking and currently smoke or have quit within the past 15 years. Fecal occult blood test (FOBT) of the stool. You may have this test every year starting at age 17. Flexible sigmoidoscopy or colonoscopy. You may have a sigmoidoscopy every 5 years or a colonoscopy every 10 years starting at age 66. Hepatitis C blood test. Hepatitis B blood test. Sexually transmitted disease (STD) testing. Diabetes screening. This is done by checking your blood sugar (glucose) after you have not eaten for a while (fasting). You may have this done every 1-3 years. Bone density scan. This is done to screen for osteoporosis. You may have this done starting at age 28. Mammogram. This may be done every 1-2 years. Talk to your health care provider about how often you should have regular mammograms. Talk with your health care provider about your test results, treatment options, and if  necessary, the need for more tests. Vaccines  Your health care provider may recommend certain vaccines, such as: Influenza vaccine. This is recommended every year. Tetanus, diphtheria, and acellular pertussis (Tdap, Td) vaccine. You may need a Td booster every 10 years. Zoster vaccine. You may need this after age 61. Pneumococcal 13-valent conjugate (PCV13) vaccine. One  dose is recommended after age 16. Pneumococcal polysaccharide (PPSV23) vaccine. One dose is recommended after age 47. Talk to your health care provider about which screenings and vaccines you need and how often you need them. This information is not intended to replace advice given to you by your health care provider. Make sure you discuss any questions you have with your health care provider. Document Released: 10/02/2015 Document Revised: 05/25/2016 Document Reviewed: 07/07/2015 Elsevier Interactive Patient Education  2017 [employer].  Fall Prevention in the Home Falls can cause injuries. They can happen to people of all ages. There are many things you can do to make your home safe and to help prevent falls. What can I do on the outside of my home? Regularly fix the edges of walkways and driveways and fix any cracks. Remove anything that might make you trip as you walk through a door, such as a raised step or threshold. Trim any bushes or trees on the path to your home. Use bright outdoor lighting. Clear any walking paths of anything that might make someone trip, such as rocks or tools. Regularly check to see if handrails are loose or broken. Make sure that both sides of any steps have handrails. Any raised decks and porches should have guardrails on the edges. Have any leaves, snow, or ice cleared regularly. Use sand or salt on walking paths during winter. Clean up any spills in your garage right away. This includes oil or grease spills. What can I do in the bathroom? Use night lights. Install grab bars by the toilet and in the tub and shower. Do not use towel bars as grab bars. Use non-skid mats or decals in the tub or shower. If you need to sit down in the shower, use a plastic, non-slip stool. Keep the floor dry. Clean up any water that spills on the floor as soon as it happens. Remove soap buildup in the tub or shower regularly. Attach bath mats securely with double-sided  non-slip rug tape. Do not have throw rugs and other things on the floor that can make you trip. What can I do in the bedroom? Use night lights. Make sure that you have a light by your bed that is easy to reach. Do not use any sheets or blankets that are too big for your bed. They should not hang down onto the floor. Have a firm chair that has side arms. You can use this for support while you get dressed. Do not have throw rugs and other things on the floor that can make you trip. What can I do in the kitchen? Clean up any spills right away. Avoid walking on wet floors. Keep items that you use a lot in easy-to-reach places. If you need to reach something above you, use a strong step stool that has a grab bar. Keep electrical cords out of the way. Do not use floor polish or wax that makes floors slippery. If you must use wax, use non-skid floor wax. Do not have throw rugs and other things on the floor that can make you trip. What can I do with my stairs? Do not leave any items  on the stairs. Make sure that there are handrails on both sides of the stairs and use them. Fix handrails that are broken or loose. Make sure that handrails are as long as the stairways. Check any carpeting to make sure that it is firmly attached to the stairs. Fix any carpet that is loose or worn. Avoid having throw rugs at the top or bottom of the stairs. If you do have throw rugs, attach them to the floor with carpet tape. Make sure that you have a light switch at the top of the stairs and the bottom of the stairs. If you do not have them, ask someone to add them for you. What else can I do to help prevent falls? Wear shoes that: Do not have high heels. Have rubber bottoms. Are comfortable and fit you well. Are closed at the toe. Do not wear sandals. If you use a stepladder: Make sure that it is fully opened. Do not climb a closed stepladder. Make sure that both sides of the stepladder are locked into place. Ask  someone to hold it for you, if possible. Clearly mark and make sure that you can see: Any grab bars or handrails. First and last steps. Where the edge of each step is. Use tools that help you move around (mobility aids) if they are needed. These include: Canes. Walkers. Scooters. Crutches. Turn on the lights when you go into a dark area. Replace any light bulbs as soon as they burn out. Set up your furniture so you have a clear path. Avoid moving your furniture around. If any of your floors are uneven, fix them. If there are any pets around you, be aware of where they are. Review your medicines with your doctor. Some medicines can make you feel dizzy. This can increase your chance of falling. Ask your doctor what other things that you can do to help prevent falls. This information is not intended to replace advice given to you by your health care provider. Make sure you discuss any questions you have with your health care provider. Document Released: 07/02/2009 Document Revised: 02/11/2016 Document Reviewed: 10/10/2014 Elsevier Interactive Patient Education  2017 [employer].

## 2023-02-20 NOTE — Progress Notes (Addendum)
I connected with  Gordy Clement on 02/20/23 by a audio enabled telemedicine application and verified that I am speaking with the correct person using two identifiers.  Patient Location: Home  Provider Location: Office/Clinic  I discussed the limitations of evaluation and management by telemedicine. The patient expressed understanding and agreed to proceed.  Subjective:   [name] is a 89 y.o. female who presents for Medicare Annual (Subsequent) preventive examination.  Review of Systems     Cardiac Risk Factors include: advanced age (>41men, >75 women);hypertension;dyslipidemia     Objective:    Today's Vitals   02/20/23 1343  Weight: 121 lb (54.9 kg)  Height: 5' 3.5" (1.613 m)   Body mass index is 21.1 kg/m.     02/20/2023    1:34 PM 10/25/2022    1:16 PM 05/14/2022    2:45 AM 02/10/2022    1:56 PM 11/29/2021   12:03 PM 11/18/2021    5:37 PM 04/01/2021    3:57 PM  Advanced Directives  Does Patient Have a Medical Advance Directive? No Yes No Yes Yes Yes Yes  Type of Furniture conservator/restorer;Living will  Healthcare Power of [place];Living will Healthcare Power of [place];Living will Healthcare Power of [place];Living will Healthcare Power of [place];Living will  Does patient want to make changes to medical advance directive?    No - Patient declined No - Patient declined No - Patient declined   Copy of Healthcare Power of Attorney in Chart?    No - copy requested  No - copy requested   Would patient like information on creating a medical advance directive? No - Patient declined          Current Medications (verified) Outpatient Encounter Medications as of 02/20/2023  Medication Sig   atorvastatin (LIPITOR) 40 MG tablet Take 1 tablet (40 mg total) by mouth daily.   Bacillus Coagulans-Inulin (PROBIOTIC) 1-250 BILLION-MG CAPS Take 1 capsule by mouth daily.   clopidogrel (PLAVIX) 75 MG tablet Take 1 tablet (75 mg total) by mouth daily.    furosemide (LASIX) 20 MG tablet TAKE 1 TABLET BY MOUTH NOT MORE THAN ONCE A WEEK AS NEEDED   Ipratropium-Albuterol (COMBIVENT RESPIMAT) 20-100 MCG/ACT AERS respimat INHALE ONE PUFF EVERY SIX HOURS   isosorbide mononitrate (IMDUR) 60 MG 24 hr tablet TAKE ONE AND ONE-HALF TABLET BY MOUTH TWICE DAILY   losartan (COZAAR) 100 MG tablet Take 1 tablet (100 mg total) by mouth daily.   magic mouthwash (lidocaine, diphenhydrAMINE, alum & mag hydroxide) suspension Swish and spit 5 mLs 4 (four) times daily as needed for mouth pain.   meclizine (ANTIVERT) 25 MG tablet Take 1 tablet (25 mg total) by mouth 3 (three) times daily as needed for dizziness.   mirtazapine (REMERON) 15 MG tablet TAKE 1 TABLET BY MOUTH NIGHTLY   nitroGLYCERIN (NITROSTAT) 0.4 MG SL tablet PLACE 1 TABLET UNDER TONGUE EVERY 5 MINUTES AS NEEDED FOR CHEST PAIN. IF NO RELIEF IN 15 MINUTES CALL 911 ( MAXIMUM 3 TABLETS).   nystatin (MYCOSTATIN) 100000 UNIT/ML suspension Take 5 mLs (500,000 Units total) by mouth 4 (four) times daily.   ondansetron (ZOFRAN) 4 MG tablet Take 1 tablet (4 mg total) by mouth every 8 (eight) hours as needed for nausea or vomiting.   pantoprazole (PROTONIX) 40 MG tablet Take 1 tablet (40 mg total) by mouth 2 (two) times daily before a meal.   Probiotic Product (PROBIOTIC PO) Take 1 capsule by mouth daily.   propranolol (INDERAL) 20  MG tablet TAKE ONE TABLET 3 TIMES DAILY AS NEEDED FOR TACHYCARDIA   ranolazine (RANEXA) 500 MG 12 hr tablet Take 1 tablet (500 mg total) by mouth 2 (two) times daily.   vitamin B-12 (CYANOCOBALAMIN) 500 MCG tablet Take 500 mcg by mouth daily.   No facility-administered encounter medications on file as of 02/20/2023.    Allergies (verified) Patient has no known allergies.   History: Past Medical History:  Diagnosis Date   C. difficile colitis    CAD (coronary artery disease)    a. s/p CABG 1999; b. Nuclear 10/11, no scar or ischemia, EF 68%; b. Lexiscan Myoview 06/18/14: no evidence of  infarction or ischemia, EF 77%, low risk study; c. 02/2019 MV: EF 56%, ? mild lat ischemia->low risk.   Carotid artery disease (HCC)    a. Doppler January, 2012, 40-59% bilateral; b. 02/2017 U/S: 1-39% bilat ICA stenosis.   Closed fracture pubis (HCC) 07/04/2017   COPD (chronic obstructive pulmonary disease) (HCC)    Diffuse cystic mastopathy    Diverticulitis    last colonoscopy  incomplete Jan 2011   Dizziness    stable now (Oct 2011) patient tells me question of TIA, we will obtain records   Gait abnormality    Patient complains of "wobbly gait", December, 2013   GERD (gastroesophageal reflux disease)    History of migraines    Hx of CABG    a. 3 vessel in 1999   Hyperlipidemia    Hypertension    Indigestion    3 weeks, October 2011   Kidney stones    Rheumatic fever    SCC (squamous cell carcinoma), face 08/01/2016   Sinus bradycardia    Mild, December, 2013   SOB (shortness of breath)    Syncopal episodes    after 18 holes of golf and increase hydrochlorothiazide, resolved    Past Surgical History:  Procedure Laterality Date   APPENDECTOMY  1950   BREAST BIOPSY Right 1994   BREAST CYST ASPIRATION     multiple BIL   CATARACT EXTRACTION Right 2009   CATARACT EXTRACTION Left 2011   COLONOSCOPY  [id number]   Dr. Evette Cristal   CORONARY ARTERY BYPASS GRAFT  1999   esophagus stretched     TONSILLECTOMY  1939   TOTAL ABDOMINAL HYSTERECTOMY  1968   due to metrorrhagia   Family History  Problem Relation Age of Onset   Heart disease Mother    Heart disease Father    Cancer Neg Hx    Drug abuse Neg Hx    Breast cancer Neg Hx    Social History   Socioeconomic History   Marital status: Widowed    Spouse name: Not on file   Number of children: 2   Years of education: Not on file   Highest education level: Not on file  Occupational History    Employer: RETIRED  Tobacco Use   Smoking status: Former    Packs/day: 0.50    Years: 40.00    Additional pack years: 0.00     Total pack years: 20.00    Types: Cigarettes    Quit date: 09/20/1983    Years since quitting: 39.4   Smokeless tobacco: Never  Vaping Use   Vaping Use: Never used  Substance and Sexual Activity   Alcohol use: Not Currently    Comment: yes, social wine   Drug use: No   Sexual activity: Never  Other Topics Concern   Not on file  Social History Narrative  She is widowed,  Lives independently , ambulates independently at baseline   Social Determinants of Health   Financial Resource Strain: Low Risk  (02/20/2023)   Overall Financial Resource Strain (CARDIA)    Difficulty of Paying Living Expenses: Not hard at all  Food Insecurity: No Food Insecurity (02/20/2023)   Hunger Vital Sign    Worried About Running Out of Food in the Last Year: Never true    Ran Out of Food in the Last Year: Never true  Transportation Needs: No Transportation Needs (02/20/2023)   PRAPARE - Administrator, Civil Service (Medical): No    Lack of Transportation (Non-Medical): No  Physical Activity: Insufficiently Active (02/20/2023)   Exercise Vital Sign    Days of Exercise per Week: 3 days    Minutes of Exercise per Session: 30 min  Stress: No Stress Concern Present (02/20/2023)   [employer] of Occupational Health - Occupational Stress Questionnaire    Feeling of Stress : Not at all  Social Connections: Socially Isolated (02/20/2023)   Social Connection and Isolation Panel [NHANES]    Frequency of Communication with Friends and Family: Not on file    Frequency of Social Gatherings with Friends and Family: More than three times a week    Attends Religious Services: Never    Database administrator or Organizations: No    Attends Banker Meetings: Never    Marital Status: Widowed    Tobacco Counseling Counseling given: Not Answered   Clinical Intake:  Pre-visit preparation completed: Yes  Pain : No/denies pain     Nutritional Risks: None Diabetes: No  How often do you  need to have someone help you when you read instructions, pamphlets, or other written materials from your doctor or pharmacy?: 1 - Never  Diabetic?no     Information entered by :: Kennedy Bucker, LPN   Activities of Daily Living    02/20/2023    1:35 PM  In your present state of health, do you have any difficulty performing the following activities:  Hearing? 0  Vision? 0  Difficulty concentrating or making decisions? 0  Walking or climbing stairs? 1  Dressing or bathing? 0  Doing errands, shopping? 0  Preparing Food and eating ? N  Using the Toilet? N  In the past six months, have you accidently leaked urine? N  Do you have problems with loss of bowel control? N  Managing your Medications? N  Managing your Finances? N  Housekeeping or managing your Housekeeping? N    Patient Care Team: Sherlene Shams, MD as PCP - General (Internal Medicine) Antonieta Iba, MD as PCP - Cardiology (Cardiology) Antonieta Iba, MD (Cardiology) Kieth Brightly, MD (General Surgery) Jennefer Bravo, MD (Unknown Physician Specialty)  Indicate any recent Medical Services you may have received from other than [facility] providers in the past year (date may be approximate).     Assessment:   This is a routine wellness examination for Lisamaria.  Hearing/Vision screen Hearing Screening - Comments:: Wears aids Vision Screening - Comments:: Wears glasses-Dr.Porfilio  Dietary issues and exercise activities discussed: Current Exercise Habits: Home exercise routine, Type of exercise: Other - see comments (P.T. once per week), Time (Minutes): 30, Frequency (Times/Week): 3, Weekly Exercise (Minutes/Week): 90, Intensity: Mild   Goals Addressed             This Visit's Progress    DIET - EAT MORE FRUITS AND VEGETABLES  Depression Screen    02/20/2023    1:33 PM 11/24/2022   10:42 AM 08/24/2022   10:53 AM 05/24/2022   11:33 AM 02/10/2022    1:58 PM 02/07/2022    2:11 PM  12/08/2021    1:39 PM  PHQ 2/9 Scores  PHQ - 2 Score 0 0 0 0 0 0 1  PHQ- 9 Score 0          Fall Risk    02/20/2023    1:34 PM 11/24/2022   10:41 AM 08/24/2022   10:53 AM 05/24/2022   11:33 AM 02/10/2022    1:58 PM  Fall Risk   Falls in the past year? 0 0 0 0 0  Number falls in past yr: 0 0   0  Injury with Fall? 0 0     Risk for fall due to : Impaired vision No Fall Risks No Fall Risks No Fall Risks   Follow up Falls prevention discussed;Falls evaluation completed Falls evaluation completed Falls evaluation completed Falls evaluation completed Falls evaluation completed    FALL RISK PREVENTION PERTAINING TO THE HOME:  Any stairs in or around the home? Yes  If so, are there any without handrails? No  Home free of loose throw rugs in walkways, pet beds, electrical cords, etc? Yes  Adequate lighting in your home to reduce risk of falls? Yes   ASSISTIVE DEVICES UTILIZED TO PREVENT FALLS:  Life alert? No  Use of a cane, walker or w/c? Yes - cane sometimes Grab bars in the bathroom? Yes  Shower chair or bench in shower? Yes - getting ready to have one Elevated toilet seat or a handicapped toilet? No    Cognitive Function:    03/25/2020    9:26 AM 11/08/2016   11:34 AM  MMSE - Mini Mental State Exam  Not completed: Unable to complete   Orientation to time  5  Orientation to Place  5  Registration  3  Attention/ Calculation  5  Recall  3  Language- name 2 objects  2  Language- repeat  1  Language- follow 3 step command  3  Language- read & follow direction  1  Write a sentence  1  Copy design  1  Total score  30        02/20/2023    1:38 PM 03/25/2019    9:21 AM 03/14/2018    9:56 AM  6CIT Screen  What Year? 0 points 0 points 0 points  What month? 0 points 0 points 0 points  What time? 0 points 0 points 0 points  Count back from 20 0 points 0 points 0 points  Months in reverse 0 points 0 points 0 points  Repeat phrase 0 points 0 points 0 points  Total Score 0 points 0  points 0 points    Immunizations Immunization History  Administered Date(s) Administered   COVID-19, mRNA, vaccine(Comirnaty)12 years and older 08/04/2022   Fluad Quad(high Dose 65+) 05/15/2019, 06/11/2020, 05/17/2021   Influenza Split 07/02/2014, 07/08/2015, 05/20/2016   Influenza Whole 06/11/2012   Influenza, High Dose Seasonal PF 05/30/2018   Influenza-Unspecified 07/03/2022   Moderna Sars-Covid-2 Vaccination 12/25/2020   PFIZER Comirnaty(Gray Top)Covid-19 Tri-Sucrose Vaccine 07/20/2020   PFIZER(Purple Top)SARS-COV-2 Vaccination 09/23/2019, 10/14/2019, 07/20/2020   Pneumococcal Conjugate-13 12/24/2013   Pneumococcal Polysaccharide-23 09/16/2011, 11/09/2015   Tdap 02/07/2013   Zoster Recombinat (Shingrix) 05/15/2017, 07/21/2017   Zoster, Live 07/26/2013    TDAP status: Due, Education has been provided regarding the importance of  this vaccine. Advised may receive this vaccine at local pharmacy or Health Dept. Aware to provide a copy of the vaccination record if obtained from local pharmacy or Health Dept. Verbalized acceptance and understanding.  Flu Vaccine status: Up to date  Pneumococcal vaccine status: Up to date  Covid-19 vaccine status: Completed vaccines  Qualifies for Shingles Vaccine? Yes   Zostavax completed Yes   Shingrix Completed?: Yes  Screening Tests Health Maintenance  Topic Date Due   DTaP/Tdap/Td (2 - Td or Tdap) 02/08/2023   INFLUENZA VACCINE  04/20/2023   Medicare Annual Wellness (AWV)  02/20/2024   Pneumonia Vaccine 21+ Years old  Completed   DEXA SCAN  Completed   COVID-19 Vaccine  Completed   Zoster Vaccines- Shingrix  Completed   HPV VACCINES  Aged Out    Health Maintenance  Health Maintenance Due  Topic Date Due   DTaP/Tdap/Td (2 - Td or Tdap) 02/08/2023    Colorectal cancer screening: No longer required.   Mammogram status: No longer required due to age.   Lung Cancer Screening: (Low Dose CT Chest recommended if Age 56-80 years, 30  pack-year currently smoking OR have quit w/in 15years.) does not qualify.     Additional Screening:  Hepatitis C Screening: does not qualify; Completed no  Vision Screening: Recommended annual ophthalmology exams for early detection of glaucoma and other disorders of the eye. Is the patient up to date with their annual eye exam?  Yes  Who is the provider or what is the name of the office in which the patient attends annual eye exams? Dr.Porfilio If pt is not established with a provider, would they like to be referred to a provider to establish care? No .   Dental Screening: Recommended annual dental exams for proper oral hygiene  Community Resource Referral / Chronic Care Management: CRR required this visit?  No   CCM required this visit?  No      Plan:     I have personally reviewed and noted the following in the patient's chart:   Medical and social history Use of alcohol, tobacco or illicit drugs  Current medications and supplements including opioid prescriptions. Patient is not currently taking opioid prescriptions. Functional ability and status Nutritional status Physical activity Advanced directives List of other physicians Hospitalizations, surgeries, and ER visits in previous 12 months Vitals Screenings to include cognitive, depression, and falls Referrals and appointments  In addition, I have reviewed and discussed with patient certain preventive protocols, quality metrics, and best practice recommendations. A written personalized care plan for preventive services as well as general preventive health recommendations were provided to patient.     Hal Hope, LPN   09/24/1094   Nurse Notes: none    I have reviewed the above information and agree with above.   Duncan Dull, MD

## 2023-02-24 ENCOUNTER — Ambulatory Visit: Payer: No Typology Code available for payment source | Admitting: Internal Medicine

## 2023-02-27 ENCOUNTER — Encounter: Payer: Self-pay | Admitting: Internal Medicine

## 2023-02-27 ENCOUNTER — Ambulatory Visit (INDEPENDENT_AMBULATORY_CARE_PROVIDER_SITE_OTHER): Payer: No Typology Code available for payment source | Admitting: Internal Medicine

## 2023-02-27 VITALS — BP 126/52 | HR 73 | Temp 97.9°F | Ht 63.5 in | Wt 121.2 lb

## 2023-02-27 DIAGNOSIS — I6523 Occlusion and stenosis of bilateral carotid arteries: Secondary | ICD-10-CM | POA: Diagnosis not present

## 2023-02-27 DIAGNOSIS — F419 Anxiety disorder, unspecified: Secondary | ICD-10-CM

## 2023-02-27 DIAGNOSIS — J449 Chronic obstructive pulmonary disease, unspecified: Secondary | ICD-10-CM | POA: Diagnosis not present

## 2023-02-27 DIAGNOSIS — I700 Atherosclerosis of aorta: Secondary | ICD-10-CM

## 2023-02-27 DIAGNOSIS — R42 Dizziness and giddiness: Secondary | ICD-10-CM | POA: Diagnosis not present

## 2023-02-27 DIAGNOSIS — M7989 Other specified soft tissue disorders: Secondary | ICD-10-CM

## 2023-02-27 DIAGNOSIS — E538 Deficiency of other specified B group vitamins: Secondary | ICD-10-CM

## 2023-02-27 DIAGNOSIS — K5904 Chronic idiopathic constipation: Secondary | ICD-10-CM

## 2023-02-27 DIAGNOSIS — N1832 Chronic kidney disease, stage 3b: Secondary | ICD-10-CM | POA: Diagnosis not present

## 2023-02-27 DIAGNOSIS — I498 Other specified cardiac arrhythmias: Secondary | ICD-10-CM

## 2023-02-27 DIAGNOSIS — J301 Allergic rhinitis due to pollen: Secondary | ICD-10-CM | POA: Diagnosis not present

## 2023-02-27 MED ORDER — FLUTICASONE PROPIONATE 50 MCG/ACT NA SUSP: 2.0000 | Freq: Every day | NASAL | 2 refills | Status: AC

## 2023-02-27 NOTE — Assessment & Plan Note (Signed)
Adding flonase given her persistent "dizziness" without vertigo

## 2023-02-27 NOTE — Assessment & Plan Note (Signed)
She remains asymptomatic at rest and with ADLS .

## 2023-02-27 NOTE — Assessment & Plan Note (Signed)
Now worried about her bowels which move 5/7 days per week bc of a dear friend's recent SBO due to CA and subsequent colostomy. Reassurance provided:

## 2023-02-27 NOTE — Assessment & Plan Note (Signed)
Improved with dc amlodipine

## 2023-02-27 NOTE — Progress Notes (Unsigned)
Subjective:  Patient ID: [name], female    DOB: [birth year]  Age: 89 y.o. MRN: [id number]  CC: There were no encounter diagnoses.   HPI [name] presents for  Chief Complaint  Patient presents with   Medical Management of Chronic Issues    3 month follow up    1) Dizziness:   chronic ,  described as non spinning,  not present with lying down . Gets drowsy with meclizine.  Has been using a cane to walk .  Exercises at [facility]:   once a week attends a class but is  able to partidpate in the class.  prescribed meclizine by Cardiology.  BP normal . She is no longer taking amlodipine (dose was reduced to 2.5 mg daily in march); she is not sure why she is not taking it.  Medical compliance is complicated by mild vascular fementia.    2) Constipation:  taking benefiber or metamucil daily  using dulcolax about every 17.  Drinking lots of water .  Stools are hard initially but are softer by the end of the session.    Stools range from normal caliber to pebble sized.  Has not treid colocae.  Worried bc her friend had  SBO due and now has a colostomy due to colon CA      Outpatient Medications Prior to Visit  Medication Sig Dispense Refill   atorvastatin (LIPITOR) 40 MG tablet Take 1 tablet (40 mg total) by mouth daily. 90 tablet 1   Bacillus Coagulans-Inulin (PROBIOTIC) 1-250 BILLION-MG CAPS Take 1 capsule by mouth daily. 30 capsule 0   clopidogrel (PLAVIX) 75 MG tablet Take 1 tablet (75 mg total) by mouth daily. 90 tablet 3   furosemide (LASIX) 20 MG tablet TAKE 1 TABLET BY MOUTH NOT MORE THAN ONCE A WEEK AS NEEDED 12 tablet 3   Ipratropium-Albuterol (COMBIVENT RESPIMAT) 20-100 MCG/ACT AERS respimat INHALE ONE PUFF EVERY SIX HOURS 4 g 5   isosorbide mononitrate (IMDUR) 60 MG 24 hr tablet TAKE ONE AND ONE-HALF TABLET BY MOUTH TWICE DAILY 270 tablet 0   losartan (COZAAR) 100 MG tablet Take 1 tablet (100 mg total) by mouth daily. 90 tablet 3   magic mouthwash (lidocaine,  diphenhydrAMINE, alum & mag hydroxide) suspension Swish and spit 5 mLs 4 (four) times daily as needed for mouth pain. 360 mL 1   meclizine (ANTIVERT) 25 MG tablet Take 1 tablet (25 mg total) by mouth 3 (three) times daily as needed for dizziness. 90 tablet 3   mirtazapine (REMERON) 15 MG tablet TAKE 1 TABLET BY MOUTH NIGHTLY 90 tablet 1   nitroGLYCERIN (NITROSTAT) 0.4 MG SL tablet PLACE 1 TABLET UNDER TONGUE EVERY 5 MINUTES AS NEEDED FOR CHEST PAIN. IF NO RELIEF IN 15 MINUTES CALL 911 ( MAXIMUM 3 TABLETS). 25 tablet 3   nystatin (MYCOSTATIN) 100000 UNIT/ML suspension Take 5 mLs (500,000 Units total) by mouth 4 (four) times daily. 60 mL 0   ondansetron (ZOFRAN) 4 MG tablet Take 1 tablet (4 mg total) by mouth every 8 (eight) hours as needed for nausea or vomiting. 40 tablet 3   pantoprazole (PROTONIX) 40 MG tablet Take 1 tablet (40 mg total) by mouth 2 (two) times daily before a meal. 180 tablet 3   Probiotic Product (PROBIOTIC PO) Take 1 capsule by mouth daily.     propranolol (INDERAL) 20 MG tablet TAKE ONE TABLET 3 TIMES DAILY AS NEEDED FOR TACHYCARDIA 270 tablet 3   ranolazine (RANEXA) 500 MG 12  hr tablet Take 1 tablet (500 mg total) by mouth 2 (two) times daily. 180 tablet 3   vitamin B-12 (CYANOCOBALAMIN) 500 MCG tablet Take 500 mcg by mouth daily.     No facility-administered medications prior to visit.    Review of Systems;  Patient denies headache, fevers, malaise, unintentional weight loss, skin rash, eye pain, sinus congestion and sinus pain, sore throat, dysphagia,  hemoptysis , cough, dyspnea, wheezing, chest pain, palpitations, orthopnea, edema, abdominal pain, nausea, melena, diarrhea, constipation, flank pain, dysuria, hematuria, urinary  Frequency, nocturia, numbness, tingling, seizures,  Focal weakness, Loss of consciousness,  Tremor, insomnia, depression, anxiety, and suicidal ideation.      Objective:  BP (!) 126/52   Pulse 73   Temp 97.9 F (36.6 C) (Oral)   Ht 5' 3.5"  (1.613 m)   Wt 121 lb 3.2 oz (55 kg)   SpO2 95%   BMI 21.13 kg/m   BP Readings from Last 3 Encounters:  02/27/23 (!) 126/52  02/06/23 (!) 176/71  01/17/23 (!) 120/50    Wt Readings from Last 3 Encounters:  02/27/23 121 lb 3.2 oz (55 kg)  02/20/23 121 lb (54.9 kg)  02/06/23 121 lb 4.1 oz (55 kg)    Physical Exam  Lab Results  Component Value Date   HGBA1C 5.8 01/30/2017   HGBA1C 5.8 09/26/2016    Lab Results  Component Value Date   CREATININE 1.57 (H) 02/06/2023   CREATININE 1.56 (H) 11/24/2022   CREATININE 1.31 (H) 05/14/2022    Lab Results  Component Value Date   WBC 7.3 02/06/2023   HGB 12.5 02/06/2023   HCT 39.7 02/06/2023   PLT 170 02/06/2023   GLUCOSE 118 (H) 02/06/2023   CHOL 138 01/06/2022   TRIG 93.0 01/06/2022   HDL 59.50 01/06/2022   LDLDIRECT 72.0 11/09/2015   LDLCALC 60 01/06/2022   ALT 10 02/06/2023   AST 25 02/06/2023   NA 137 02/06/2023   K 4.7 02/06/2023   CL 106 02/06/2023   CREATININE 1.57 (H) 02/06/2023   BUN 24 (H) 02/06/2023   CO2 23 02/06/2023   TSH 5.39 01/06/2022   INR 0.9 01/31/2018   HGBA1C 5.8 01/30/2017   MICROALBUR 2.5 (H) 12/26/2011    No results found.  Assessment & Plan:  .There are no diagnoses linked to this encounter.   I provided 30 minutes of face-to-face time during this encounter reviewing patient's last visit with me, patient's  most recent visit with cardiology,  nephrology,  and neurology,  recent surgical and non surgical procedures, previous  labs and imaging studies, counseling on currently addressed issues,  and post visit ordering to diagnostics and therapeutics .   Follow-up: No follow-ups on file.   Sherlene Shams, MD

## 2023-02-27 NOTE — Patient Instructions (Addendum)
1)  DO NOT RESUME AMLODIPINE.  Your blood pressure is fine.    2)  LET'S TRY USING FLONASE FOR A MONTH TO SEE IF THE LIGHT HEADEDNESS RESOLVES WITH IMPROVED SINUS AERATION   3) For the constipation:  Continue benefiber or metamucil every day but it should be taken 2 hours before or after your medications.   Ok to add stool softener called Colace to your daily regimen

## 2023-02-27 NOTE — Assessment & Plan Note (Signed)
Reviewed findings of prior CT scan today..  Patient is tolerating high potency statin therapy with atorvastatin

## 2023-02-27 NOTE — Assessment & Plan Note (Signed)
March 14 EKG in ER noted SR, frequent PVC's.  prolonged QT.

## 2023-02-28 NOTE — Assessment & Plan Note (Signed)
Advised to pursue a goal of 25 to 30 g of fiber daily.  Made aware that the majority of this may be through natural sources, but advised to be aware of actual consumption and to ensure minimal consumption by daily supplementation.  Various forms of supplements discussed.  Strongly advised to consume more fluids to ensure adequate hydration, instructed to watch color of urine to determine adequacy of hydration.

## 2023-02-28 NOTE — Assessment & Plan Note (Addendum)
Last doppler was over a year ago.   1-39% stenosis bilaterally

## 2023-02-28 NOTE — Assessment & Plan Note (Signed)
Diagnosis and management reviewed with patient.   Nephrology referral declined.  She is avoiding NSAIDs .  Cr is  stable   Lab Results  Component Value Date   CREATININE 1.57 (H) 02/06/2023

## 2023-02-28 NOTE — Assessment & Plan Note (Signed)
Adding flonase for a trial to relieve Eustachian tube dysfunction

## 2023-03-02 ENCOUNTER — Ambulatory Visit: Payer: No Typology Code available for payment source | Attending: Internal Medicine

## 2023-03-09 ENCOUNTER — Other Ambulatory Visit: Payer: Self-pay | Admitting: Cardiovascular Disease

## 2023-03-09 ENCOUNTER — Ambulatory Visit: Payer: No Typology Code available for payment source | Attending: Internal Medicine

## 2023-03-09 ENCOUNTER — Other Ambulatory Visit: Payer: Self-pay | Admitting: Internal Medicine

## 2023-03-09 NOTE — Telephone Encounter (Signed)
Medication was discontinued on 01/17/2023. Is it okay to refuse?

## 2023-03-16 ENCOUNTER — Ambulatory Visit: Payer: No Typology Code available for payment source | Attending: Internal Medicine

## 2023-03-16 DIAGNOSIS — R2681 Unsteadiness on feet: Secondary | ICD-10-CM | POA: Diagnosis not present

## 2023-03-16 DIAGNOSIS — M6281 Muscle weakness (generalized): Secondary | ICD-10-CM | POA: Insufficient documentation

## 2023-03-16 DIAGNOSIS — R269 Unspecified abnormalities of gait and mobility: Secondary | ICD-10-CM | POA: Diagnosis not present

## 2023-03-16 DIAGNOSIS — R531 Weakness: Secondary | ICD-10-CM | POA: Insufficient documentation

## 2023-03-16 NOTE — Therapy (Signed)
OUTPATIENT PHYSICAL THERAPY THORACOLUMBAR TREATMENT      Patient Name: [name] MRN: [id number] DOB:04-12-1926, 89 y.o., female Today's Date: 03/16/2023  END OF SESSION:  PT End of Session - 03/16/23 1300     Visit Number 11    Number of Visits 17    Date for PT Re-Evaluation 04/06/23    Authorization - Number of Visits 33    Progress Note Due on Visit 10    PT Start Time 1302    PT Stop Time 1345    PT Time Calculation (min) 43 min    Activity Tolerance Patient tolerated treatment well    Behavior During Therapy WFL for tasks assessed/performed              Past Medical History:  Diagnosis Date   C. difficile colitis    CAD (coronary artery disease)    a. s/p CABG 1999; b. Nuclear 10/11, no scar or ischemia, EF 68%; b. Lexiscan Myoview 06/18/14: no evidence of infarction or ischemia, EF 77%, low risk study; c. 02/2019 MV: EF 56%, ? mild lat ischemia->low risk.   Carotid artery disease (HCC)    a. Doppler January, 2012, 40-59% bilateral; b. 02/2017 U/S: 1-39% bilat ICA stenosis.   Closed fracture pubis (HCC) 07/04/2017   COPD (chronic obstructive pulmonary disease) (HCC)    Diffuse cystic mastopathy    Diverticulitis    last colonoscopy  incomplete Jan 2011   Dizziness    stable now (Oct 2011) patient tells me question of TIA, we will obtain records   Gait abnormality    Patient complains of "wobbly gait", December, 2013   GERD (gastroesophageal reflux disease)    History of migraines    Hx of CABG    a. 3 vessel in 1999   Hyperlipidemia    Hypertension    Indigestion    3 weeks, October 2011   Kidney stones    Rheumatic fever    SCC (squamous cell carcinoma), face 08/01/2016   Sinus bradycardia    Mild, December, 2013   SOB (shortness of breath)    Syncopal episodes    after 18 holes of golf and increase hydrochlorothiazide, resolved    Past Surgical History:  Procedure Laterality Date   APPENDECTOMY  1950   BREAST BIOPSY Right 1994    BREAST CYST ASPIRATION     multiple BIL   CATARACT EXTRACTION Right 2009   CATARACT EXTRACTION Left 2011   COLONOSCOPY  [id number]   Dr. 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   Mild cognitive impairment with memory loss 10/12/2021   Hyperparathyroidism due to renal insufficiency (HCC) 02/02/2021   CKD (chronic kidney disease) stage 3, GFR 30-59 ml/min (HCC) 01/15/2021   Grief 04/08/2020   History of esophageal stricture 04/08/2020   Leg swelling 03/09/2020   Atypical chest pain 01/10/2020   B12 deficiency 01/09/2020   Mild malnutrition (HCC) 03/25/2019   Diastolic CHF, chronic (HCC) 03/25/2019   Vitamin D deficiency  03/25/2019   S/P excision of skin lesion, follow-up exam 12/26/2017   Impingement syndrome of left shoulder region 07/04/2017   Allergic rhinitis 12/31/2016   Constipation 08/20/2016   Osteoporosis of forearm 02/13/2016   Insomnia secondary to anxiety 07/12/2015   Anxiety disorder 07/12/2015   Essential (primary) hypertension 10/07/2014   Sinus bradycardia    Hardening of the aorta (main artery of the heart) (HCC) 06/26/2014   History of Clostridium difficile colitis 10/20/2013   History of pancreatitis 10/20/2013   Low back pain 10/04/2013   Do not resuscitate discussion 02/07/2013   Chest pain at rest 12/26/2011   COPD, moderate (HCC) 12/05/2011   Presence of aortocoronary bypass graft    Stage 3b chronic kidney disease (HCC) 09/25/2011   Hyperlipidemia    SOB (shortness of  breath)    Dizziness    Coronary artery disease of native artery of native heart with stable angina pectoris (HCC)    Carotid artery disease (HCC)     PCP: Duncan Dull MD  REFERRING PROVIDER: Duncan Dull MD  REFERRING DIAG: unsteadiness  Rationale for Evaluation and Treatment: Rehabilitation  THERAPY DIAG:  Unsteadiness on feet  Muscle weakness (generalized)  Generalized weakness  Abnormality of gait and mobility  ONSET DATE: ~12months  SUBJECTIVE:                                                                                                                                                                                             SUBJECTIVE STATEMENT:   Pt reports missing the last few weeks for sickness, dizziness, and being out of town. Remains with her usual dizziness but denies falls.    PERTINENT HISTORY: Pt is a 89 y.o. female who presents with unsteadiness on feet that began about 4 months ago. Pt reports she started to feel "wobbly and unsteady". MD told pt she has generalized weakness and her response was "that's just what happens when you're this old". Unsteadiness on feet ISQ since onset. Ambulates independently without an AD. No falls in the last 6 months. Pt owns a SPC but does not use it. Pt lives alone in a 2-story house close to the clinic and her daughter visits very often. Her room is upstairs; she walks up ~10 steps holding onto the L handrail with both hands to get to her room. Pt drives independently with no issues. Pt remains independent with all ADLs including grocery shopping with the exception of big loads (her daughter helps her out with that), cleaning (she has help for that), cooking ("not because I can't but because I don't want to do it"). PMH: heart surgery  in 1999, COPD, SOB intermittently. Pt denies N/V, B&B changes, unexplained weight fluctuation, saddle paresthesia, fever, night sweats, or unrelenting night pain at this time. No falls in past 6  months.   Pt accompanied by: self     PAIN:  Are you having pain? No  PRECAUTIONS: Other: Fall risk  WEIGHT BEARING RESTRICTIONS: No  FALLS: Has patient fallen in last 6 months? No  LIVING ENVIRONMENT: Lives with: lives with their family and lives alone Lives in: House/apartment Stairs: Yes: Internal: 10 steps; on left going up and External: 3-4 steps; can reach both, walkway to front door - 3-4 steps; can reach both Has following equipment at home: Single point cane  PLOF: Independent  PATIENT GOALS: Pt does not want to feel wobbly. Wants to feel more stable and prevent the risk of falls.  OBJECTIVE:   BP:  prior to - 137/57; after - 145/98 : 765 feet with minimal CGA Grip strength - L: 25.33 lbs R: 30.67 lbs   DIAGNOSTIC FINDINGS: N/A  COGNITION: Overall cognitive status: Within functional limits for tasks assessed   SENSATION: WFL  COORDINATION: Slowed coordination  EDEMA: N/A   MUSCLE TONE:  Decreased muscle tone bilaterally  MUSCLE LENGTH: Not assessed  DTRs:   Not assessed   POSTURE: No significant postural limitations  LOWER EXTREMITY ROM:     Active  Right Eval Left Eval  Hip flexion 102 102  Hip extension 120 120  Hip abduction    Hip adduction    Hip internal rotation    Hip external rotation    Knee flexion    Knee extension    Ankle dorsiflexion    Ankle plantarflexion    Ankle inversion    Ankle eversion     (Blank rows = not tested)  LOWER EXTREMITY MMT:    MMT Right Eval Left Eval  Hip flexion 3 3  Hip extension 3 3  Hip abduction 3 3  Hip adduction    Hip internal rotation    Hip external rotation    Knee flexion    Knee extension    Ankle dorsiflexion    Ankle plantarflexion    Ankle inversion    Ankle eversion    (Blank rows = not tested)  BED MOBILITY:  Sit to supine Complete Independence Supine to sit Complete Independence Pt uses walker for bed transfer   TRANSFERS: Assistive  device utilized: None  Sit to stand: Modified independence Stand to sit: Used bilateral UE to stand Floor: Complete Independence   STAIRS:  Deferred to next session  GAIT: Gait pattern: decreased step length- Left, decreased stride length, decreased hip/knee flexion- Right, decreased hip/knee flexion- Left, decreased ankle dorsiflexion- Right, decreased ankle dorsiflexion- Left, shuffling, narrow BOS, poor foot clearance- Right, and poor foot clearance- Left Distance walked: 10 meters Assistive device utilized: None Level of assistance: Complete Independence Comments: shuffling gait pattern  FUNCTIONAL TESTS:  5 times sit to stand: 18.09 secs (pt used arms to push off her quads to assist in transfer) Timed up and go (TUG): 15.44 secs 10 meter walk test: self-selected - 0.83 m/s; fastest - 1.0 m/s Dynamic Gait Index: 15/24  PATIENT SURVEYS:  FOTO 41  TODAY'S TREATMENT: DATE: 03/16/23      Neuro Re-Ed:  Seated vertical and horizontal VOR x1. X10/side   Head turns on foam: x10 R/L horizontal and vertical. Reports onset of concordant dysequilibrium 4-5/10   Head turns on firm surface: x10 R/L horizontal and vertical. Reports  onset of concordant dysequilibrium but not as severe as on foam. 1-2/10 NPS.   Gait 10 meters:   X4 horizontal head turns, CGA   X6 vertical head turns, CGA    Patient ambulated 6 min outdoor today on varied surfaces: concrete, asphalt, mulch, descending and ascending ramps to improve ankle/hip righting reactions and dynamic balance with community tasks.      There.Ex:  NuStep, seat 7, arms 7, level 3 for 5 minutes for gentle muscular endurance and gathering subjective information. Unbilled.        PATIENT EDUCATION: Education details: Pt was educated on diagnosis, anatomy and pathology involved, prognosis, role of PT, and was given an HEP, demonstrating exercises with proper form following verbal and tactile cues, and was given a paper  hand out to continue exercise at home. Pt was educated on and agreed to plan of care Person educated: Patient Education method: Explanation, Demonstration, Tactile cues, Verbal cues, and Handouts Education comprehension: verbalized understanding, returned demonstration, and verbal cues required  HOME EXERCISE PROGRAM: Standing marches - 1 x weekly - 2 sets - 8-12 reps Standing hip abduction with counter support - 1 x weekly  HS stretch - 3 x 30 seconds 2x/day    ASSESSMENT:  CLINICAL IMPRESSION: Pt arriving to PT after ~ 1 month hiatus for various issues. Focus of session on assessing pt's dizziness with functional mobility as she reports this has not truly been addressed since being in PT and this is her biggest concern. No BPPV testing as dizziness is reported as non spinning. Pt with impaired vestibular input as evidenced with head turns on foam surface with less severity with firm surface and gait with head turns. No formal testing this date as author new to pt. Further vestibular testing to be performed at f/u treatment session. Pt at increased risk of falls with dynamic gait tasks and household and community levels. Pt will continue to benefit from skilled therapy to address remaining deficits in order to improve overall QoL and return to PLOF.       OBJECTIVE IMPAIRMENTS: Abnormal gait, decreased activity tolerance, decreased balance, decreased coordination, decreased endurance, decreased mobility, difficulty walking, decreased ROM, decreased strength, increased fascial restrictions, impaired flexibility, improper body mechanics, and postural dysfunction.   ACTIVITY LIMITATIONS: carrying, lifting, bending, squatting, stairs, transfers, bed mobility, dressing, and hygiene/grooming  PARTICIPATION LIMITATIONS: meal prep, cleaning, laundry, shopping, and community activity  PERSONAL FACTORS: Age, Education, Fitness, Past/current experiences, Time since onset of  injury/illness/exacerbation, and 3+ comorbidities: COPD, CAD, OA  are also affecting patient's functional outcome.   REHAB POTENTIAL: Good  CLINICAL DECISION MAKING: Evolving/moderate complexity  EVALUATION COMPLEXITY: Moderate   GOALS: Goals reviewed with patient? Yes  SHORT TERM GOALS: Target date: 11/24/22  Pt will be independent with HEP in order to improve strength and balance in order to decrease fall risk and improve function at home and work. Baseline: 10/26/22 HEP given  Goal status: INITIAL    LONG TERM GOALS: Target date: 01/08/23  Pt will demonstrate speed of at least 1.0 m/s to demonstrate decreased fall risk and increased independence in community ambulation Baseline: Baseline: self-selected - 0.83 m/s; fastest - 0.91 m/s 02/09/23: self-selected - 0.67 m/s; fastest - 0.93 m/s Goal status: IN PROGRESS  2.  Pt will increase FOTO score to 53 to demonstrate predicted increase in functional mobility to complete ADLs Baseline: 48 Goal status: INITIAL  3.  Pt will ascend 10 steps only using LUE to hold onto the L handrail for  increased independence to complete ADLs Baseline: Unsteadiness with 10 steps and requires both hands on L handrail 02/09/23: Pt still requires both handrails for support Goal status: IN PROGRESS  4.  Pt will demonstrate TUG of 13 secs or less in order to demonstrate decreased fall risk Baseline: 18.09 secs 02/09/23: 10.91 Goal status: MET  5.  Pt will improve DGI by to least 19/24 in order to demonstrate clinically significant improvement in balance for decreased risk for falls Baseline: 15/24 02/09/23: 13/24 Goal status: IN PROGRESS  6.  Pt will decrease 5TSTS to at least 13 seconds in order to demonstrate clinically significant improvement in LE strength to age matched norms, needed for increased independence with basic transfers Baseline: 10/26/22 18.09 secs (pt used arms to push off her quads to assist in transfer) 02/09/23: 15.29 sec (pt  minimally uses her UE's to push through quads in transfer) Goal status: IN PROGRESS     PLAN:  PT FREQUENCY: 1-2x/week  PT DURATION: 8 weeks  PLANNED INTERVENTIONS: Therapeutic exercises, Therapeutic activity, Neuromuscular re-education, Balance training, Gait training, Patient/Family education, Self Care, Joint mobilization, Joint manipulation, Stair training, DME instructions, Aquatic Therapy, Dry Needling, Electrical stimulation, Spinal manipulation, Spinal mobilization, Cryotherapy, Moist heat, Traction, Ultrasound, Ionotophoresis 4mg /ml Dexamethasone, Manual therapy, and Re-evaluation.  PLAN FOR NEXT SESSION: Vestibular screen  Delphia Grates. Fairly IV, PT, DPT Physical Therapist- [place]  [facility]  03/16/23, 2:19 PM

## 2023-03-21 ENCOUNTER — Ambulatory Visit: Payer: No Typology Code available for payment source | Attending: Internal Medicine

## 2023-03-30 ENCOUNTER — Ambulatory Visit: Payer: No Typology Code available for payment source

## 2023-04-06 ENCOUNTER — Ambulatory Visit: Payer: No Typology Code available for payment source

## 2023-04-10 ENCOUNTER — Other Ambulatory Visit: Payer: Self-pay | Admitting: Internal Medicine

## 2023-04-10 DIAGNOSIS — Z789 Other specified health status: Secondary | ICD-10-CM | POA: Diagnosis not present

## 2023-04-10 DIAGNOSIS — H6121 Impacted cerumen, right ear: Secondary | ICD-10-CM | POA: Diagnosis not present

## 2023-04-12 ENCOUNTER — Encounter: Payer: Self-pay | Admitting: Internal Medicine

## 2023-04-12 DIAGNOSIS — H353131 Nonexudative age-related macular degeneration, bilateral, early dry stage: Secondary | ICD-10-CM | POA: Diagnosis not present

## 2023-04-12 DIAGNOSIS — H3582 Retinal ischemia: Secondary | ICD-10-CM | POA: Diagnosis not present

## 2023-04-12 DIAGNOSIS — H353221 Exudative age-related macular degeneration, left eye, with active choroidal neovascularization: Secondary | ICD-10-CM | POA: Diagnosis not present

## 2023-04-13 ENCOUNTER — Encounter: Payer: Self-pay | Admitting: Internal Medicine

## 2023-04-13 ENCOUNTER — Ambulatory Visit: Payer: No Typology Code available for payment source

## 2023-04-13 NOTE — Telephone Encounter (Signed)
Patient's daughter called requesting refill on Ipratropium-Albuterol (COMBIVENT RESPIMAT) 20-100 MCG/ACT AERS respimat  Prescription Request  04/13/2023  LOV: 02/27/2023  What is the name of the medication or equipment? Ipratropium-Albuterol (COMBIVENT RESPIMAT) 20-100 MCG/ACT AERS respimat   Have you contacted your pharmacy to request a refill? Yes   Which pharmacy would you like this sent to?  [facility] - [place], [place] - [address] [place] [address] Phone: [phone] Fax: [phone]    Patient notified that their request is being sent to the clinical staff for review and that they should receive a response within 2 business days.   Please advise at Mobile [phone] (mobile)

## 2023-04-13 NOTE — Telephone Encounter (Signed)
Medication was refilled this morning. Pt is aware.

## 2023-04-13 NOTE — Telephone Encounter (Signed)
Amlodipine was discontinued on 01/17/2023. Is it okay to refuse?

## 2023-04-14 ENCOUNTER — Telehealth: Payer: Self-pay | Admitting: Internal Medicine

## 2023-04-14 DIAGNOSIS — J449 Chronic obstructive pulmonary disease, unspecified: Secondary | ICD-10-CM

## 2023-04-14 NOTE — Telephone Encounter (Signed)
[facility] wanted to know if there is a cheaper version of  Ipratropium-Albuterol (COMBIVENT RESPIMAT) 20-100 MCG/ACT AERS respimat, it will cost patient of $400. Patient can not afford this medication.Please send a new prescription, if possible.

## 2023-04-15 MED ORDER — IPRATROPIUM-ALBUTEROL 0.5-2.5 (3) MG/3ML IN SOLN: 3.0000 mL | Freq: Four times a day (QID) | RESPIRATORY_TRACT | 2 refills | Status: DC | PRN

## 2023-04-15 NOTE — Telephone Encounter (Signed)
No, there is not a cheaper one unless we change it to a nebulized solution.  [facility] the [facility] to [facility].  The DME order for the nebulizer may not be necessary if she already has one

## 2023-04-15 NOTE — Addendum Note (Signed)
Addended by: Sherlene Shams on: 04/15/2023 11:22 AM   Modules accepted: Orders

## 2023-04-16 ENCOUNTER — Encounter: Payer: Self-pay | Admitting: Internal Medicine

## 2023-04-17 NOTE — Telephone Encounter (Signed)
See telephone encounter.

## 2023-04-17 NOTE — Telephone Encounter (Signed)
Spoke with patient, patient doesn't have a nebulizer. Order was placed, and placed in folder for signature.

## 2023-04-19 ENCOUNTER — Other Ambulatory Visit: Payer: Self-pay

## 2023-04-19 ENCOUNTER — Emergency Department
Admission: EM | Admit: 2023-04-19 | Discharge: 2023-04-19 | Disposition: A | Discharge status: Home / Self Care | Payer: No Typology Code available for payment source | Attending: Student in an Organized Health Care Education/Training Program | Admitting: Student in an Organized Health Care Education/Training Program

## 2023-04-19 ENCOUNTER — Emergency Department: Payer: No Typology Code available for payment source

## 2023-04-19 DIAGNOSIS — E860 Dehydration: Secondary | ICD-10-CM | POA: Insufficient documentation

## 2023-04-19 DIAGNOSIS — Z1152 Encounter for screening for COVID-19: Secondary | ICD-10-CM | POA: Diagnosis not present

## 2023-04-19 DIAGNOSIS — J929 Pleural plaque without asbestos: Secondary | ICD-10-CM | POA: Diagnosis not present

## 2023-04-19 DIAGNOSIS — R42 Dizziness and giddiness: Secondary | ICD-10-CM | POA: Insufficient documentation

## 2023-04-19 DIAGNOSIS — N189 Chronic kidney disease, unspecified: Secondary | ICD-10-CM | POA: Insufficient documentation

## 2023-04-19 DIAGNOSIS — I2510 Atherosclerotic heart disease of native coronary artery without angina pectoris: Secondary | ICD-10-CM | POA: Diagnosis not present

## 2023-04-19 DIAGNOSIS — K449 Diaphragmatic hernia without obstruction or gangrene: Secondary | ICD-10-CM | POA: Diagnosis not present

## 2023-04-19 DIAGNOSIS — J449 Chronic obstructive pulmonary disease, unspecified: Secondary | ICD-10-CM | POA: Diagnosis not present

## 2023-04-19 LAB — URINALYSIS, ROUTINE W REFLEX MICROSCOPIC
Bacteria, UA: NONE SEEN
Bilirubin Urine: NEGATIVE
Glucose, UA: NEGATIVE mg/dL
Hgb urine dipstick: NEGATIVE
Ketones, ur: NEGATIVE mg/dL
Nitrite: NEGATIVE
Protein, ur: 100 mg/dL — AB
Specific Gravity, Urine: 1.023 (ref 1.005–1.030)
pH: 6 (ref 5.0–8.0)

## 2023-04-19 LAB — SARS CORONAVIRUS 2 BY RT PCR: SARS Coronavirus 2 by RT PCR: NEGATIVE

## 2023-04-19 LAB — CBC
HCT: 37.4 % (ref 36.0–46.0)
Hemoglobin: 12 g/dL (ref 12.0–15.0)
MCH: 34.2 pg — ABNORMAL HIGH (ref 26.0–34.0)
MCHC: 32.1 g/dL (ref 30.0–36.0)
MCV: 106.6 fL — ABNORMAL HIGH (ref 80.0–100.0)
Platelets: 185 10*3/uL (ref 150–400)
RBC: 3.51 MIL/uL — ABNORMAL LOW (ref 3.87–5.11)
RDW: 11.9 % (ref 11.5–15.5)
WBC: 7.3 10*3/uL (ref 4.0–10.5)
nRBC: 0 % (ref 0.0–0.2)

## 2023-04-19 LAB — BASIC METABOLIC PANEL
Anion gap: 7 (ref 5–15)
BUN: 27 mg/dL — ABNORMAL HIGH (ref 8–23)
CO2: 25 mmol/L (ref 22–32)
Calcium: 8.2 mg/dL — ABNORMAL LOW (ref 8.9–10.3)
Chloride: 106 mmol/L (ref 98–111)
Creatinine, Ser: 1.71 mg/dL — ABNORMAL HIGH (ref 0.44–1.00)
GFR, Estimated: 27 mL/min — ABNORMAL LOW (ref >60)
Glucose, Bld: 111 mg/dL — ABNORMAL HIGH (ref 70–99)
Potassium: 5 mmol/L (ref 3.5–5.1)
Sodium: 138 mmol/L (ref 135–145)

## 2023-04-19 LAB — TROPONIN I (HIGH SENSITIVITY): Troponin I (High Sensitivity): 15 ng/L (ref <18)

## 2023-04-19 MED ORDER — SODIUM CHLORIDE 0.9 % IV BOLUS
1000.0000 mL | Freq: Once | INTRAVENOUS | Status: AC
  Administered 2023-04-19: 1000 mL via INTRAVENOUS

## 2023-04-19 NOTE — ED Provider Notes (Signed)
[facility] Provider Note    Event Date/Time   First MD Initiated Contact with Patient 04/19/23 1707     (approximate)   History   Dizziness   HPI  [name] is a 89 y.o. female with a history of CAD, CKD, COPD poor p.o. intake and previous admissions to hospital for dizziness secondary to dehydration presents to the ER for evaluation of recurrent dizziness.  It is fine when she is sitting still but occurs when she goes from laying down or sitting down to standing up.  Takes a few moments for her to equilibrate but feels like she is about to fall down during those periods.  Denies any numbness or tingling no chest pain.  No abdominal pain.  No nausea or vomiting.     Physical Exam   Triage Vital Signs: ED Triage Vitals [04/19/23 1650]  Encounter Vitals Group     BP 112/71     Systolic BP Percentile      Diastolic BP Percentile      Pulse Rate 81     Resp 16     Temp 97.7 F (36.5 C)     Temp src      SpO2 95 %     Weight 120 lb (54.4 kg)     Height 5\' 3"  (1.6 m)     Head Circumference      Peak Flow      Pain Score 0     Pain Loc      Pain Education      Exclude from Growth Chart     Most recent vital signs: Vitals:   04/19/23 2000 04/19/23 2030  BP: (!) 197/69 (!) 194/65  Pulse: 72 64  Resp: 19 13  Temp:    SpO2: 100% 98%     Constitutional: Alert  Eyes: Conjunctivae are normal.  Head: Atraumatic. Nose: No congestion/rhinnorhea. Mouth/Throat: Mucous membranes are moist.   Neck: Painless ROM.  Cardiovascular:   Good peripheral circulation. Respiratory: Normal respiratory effort.  No retractions.  Gastrointestinal: Soft and nontender.  Musculoskeletal:  no deformity Neurologic:  MAE spontaneously. No gross focal neurologic deficits are appreciated. Skin:  Skin is warm, dry and intact. No rash noted. Psychiatric: Mood and affect are normal. Speech and behavior are normal.    ED Results / Procedures / Treatments    Labs (all labs ordered are listed, but only abnormal results are displayed) Labs Reviewed  CBC - Abnormal; Notable for the following components:      Result Value   RBC 3.51 (*)    MCV 106.6 (*)    MCH 34.2 (*)    All other components within normal limits  BASIC METABOLIC PANEL - Abnormal; Notable for the following components:   Glucose, Bld 111 (*)    BUN 27 (*)    Creatinine, Ser 1.71 (*)    Calcium 8.2 (*)    GFR, Estimated 27 (*)    All other components within normal limits  URINALYSIS, ROUTINE W REFLEX MICROSCOPIC - Abnormal; Notable for the following components:   Color, Urine YELLOW (*)    APPearance CLEAR (*)    Protein, ur 100 (*)    Leukocytes,Ua MODERATE (*)    All other components within normal limits  SARS CORONAVIRUS 2 BY RT PCR  TROPONIN I (HIGH SENSITIVITY)     EKG  ED ECG REPORT I, Willy Eddy, the attending physician, personally viewed and interpreted this ECG.   Date: 04/19/2023  EKG Time: 16:55  Rate: 75  Rhythm: sinus  Axis: normal  Intervals: normal  ST&T Change: non specific st abn, no stemi,    RADIOLOGY Please see ED Course for my review and interpretation.  I personally reviewed all radiographic images ordered to evaluate for the above acute complaints and reviewed radiology reports and findings.  These findings were personally discussed with the patient.  Please see medical record for radiology report.    PROCEDURES:  Critical Care performed:  Procedures   MEDICATIONS ORDERED IN ED: Medications  sodium chloride 0.9 % bolus 1,000 mL (0 mLs Intravenous Stopped 04/19/23 1929)     IMPRESSION / MDM / ASSESSMENT AND PLAN / ED COURSE  I reviewed the triage vital signs and the nursing notes.                              Differential diagnosis includes, but is not limited to, dehydration, orthostasis, electrolyte abnormality, CHF, ACS, anemia  Patient presenting to the ER for evaluation of symptoms as described above.   Based on symptoms, risk factors and considered above differential, this presenting complaint could reflect a potentially life-threatening illness therefore the patient will be placed on continuous pulse oximetry and telemetry for monitoring.  Laboratory evaluation will be sent to evaluate for the above complaints.  This does not seem consistent with CNS lesion or pathology.  Seems more consistent with dehydration and orthostatics given her history will give IV fluids and reassess.    Clinical Course as of 04/19/23 2105  Wed Apr 19, 2023  1812 BMP shows evidence of mild AKI receiving IV fluids.  No bacteria on UA.  No anemia.  No leukocytosis. [PR]  1812 X-ray my review and interpretation without evidence of consolidation. [PR]  2104 Patient reassessed.  Feels significantly improved after IV fluid.  She is tolerating p.o.  She is able to ambulate about the room without unsteadiness.  We discussed option for admission to the hospital for IV fluids, further observation in the ER with additional IV fluids versus trial of outpatient management.  States that she feels well and like to go home and given her reassuring workup and improvement after hydration somewhat at previous visits I think that is reasonable.  We discussed signs and symptoms for which she should return to the ER. [PR]    Clinical Course User Index [PR] Willy Eddy, MD     FINAL CLINICAL IMPRESSION(S) / ED DIAGNOSES   Final diagnoses:  Dehydration     Rx / DC Orders   ED Discharge Orders     None        Note:  This document was prepared using Dragon voice recognition software and may include unintentional dictation errors.    Willy Eddy, MD 04/19/23 2106

## 2023-04-19 NOTE — ED Triage Notes (Signed)
Pt to ED for dizziness x3 days. NAD noted

## 2023-04-24 ENCOUNTER — Ambulatory Visit: Payer: No Typology Code available for payment source

## 2023-04-25 ENCOUNTER — Ambulatory Visit: Payer: Self-pay

## 2023-04-25 ENCOUNTER — Telehealth: Payer: Self-pay | Admitting: *Deleted

## 2023-04-25 NOTE — Transitions of Care (Post Inpatient/ED Visit) (Signed)
04/25/2023  Name: [name] MRN: [id number] DOB: December 07, 1925  Today's TOC FU Call Status: Today's TOC FU Call Status:: Successful TOC FU Call Completed TOC FU Call Complete Date: 04/25/23  Transition Care Management Follow-up Telephone Call Date of Discharge: 04/21/23 Discharge Facility: [facility]) Type of Discharge: Emergency Department Reason for ED Visit: Other: (Dehydration) How have you been since you were released from the hospital?: Better Any questions or concerns?: No  Items Reviewed: Did you receive and understand the discharge instructions provided?: Yes Medications obtained,verified, and reconciled?: Yes (Medications Reviewed) Any new allergies since your discharge?: No Dietary orders reviewed?: No Do you have support at home?: Yes People in Home: alone Name of Support/Comfort Primary Source: [name]  Medications Reviewed Today: Medications Reviewed Today     Reviewed by [name], RN (Case Manager) on 04/25/23 at 1453  Med List Status: <None>   Medication Order Taking? Sig Documenting Provider Last Dose Status Informant  atorvastatin (LIPITOR) 40 MG tablet [id number] Yes Take 1 tablet (40 mg total) by mouth daily. Sherlene Shams, MD Taking Active   Bacillus Coagulans-Inulin (PROBIOTIC) 1-250 BILLION-MG CAPS [id number] Yes Take 1 capsule by mouth daily. Ward, Layla Maw, DO Taking Active   clopidogrel (PLAVIX) 75 MG tablet [id number] Yes Take 1 tablet (75 mg total) by mouth daily. Antonieta Iba, MD Taking Active   fluticasone [facility]) 50 MCG/ACT nasal spray [id number] Yes Place 2 sprays into both nostrils daily. Sherlene Shams, MD Taking Active   furosemide (LASIX) 20 MG tablet [id number] Yes TAKE 1 TABLET BY MOUTH NOT MORE THAN ONCE A WEEK AS NEEDED Sherlene Shams, MD Taking Active   ipratropium-albuterol (DUONEB) 0.5-2.5 (3) MG/3ML SOLN [id number] Yes Take 3 mLs by nebulization every 6 (six) hours as needed. Sherlene Shams, MD Taking Active   isosorbide mononitrate (IMDUR) 60 MG 24 hr tablet [id number] Yes TAKE ONE AND ONE-HALF TABLET BY MOUTH TWICE DAILY Gollan, Tollie Pizza, MD Taking Active   losartan (COZAAR) 100 MG tablet [id number] Yes Take 1 tablet (100 mg total) by mouth daily. Antonieta Iba, MD Taking Active   magic mouthwash (lidocaine, diphenhydrAMINE, alum & mag hydroxide) suspension [id number] Yes Swish and spit 5 mLs 4 (four) times daily as needed for mouth pain. Sherlene Shams, MD Taking Active   meclizine (ANTIVERT) 25 MG tablet [id number] Yes Take 1 tablet (25 mg total) by mouth 3 (three) times daily as needed for dizziness. Antonieta Iba, MD Taking Active   mirtazapine (REMERON) 15 MG tablet [id number] Yes TAKE 1 TABLET BY MOUTH NIGHTLY Sherlene Shams, MD Taking Active   nitroGLYCERIN (NITROSTAT) 0.4 MG SL tablet [id number] Yes PLACE 1 TABLET UNDER TONGUE EVERY 5 MINUTES AS NEEDED FOR CHEST PAIN. IF NO RELIEF IN 15 MINUTES CALL 911 ( MAXIMUM 3 TABLETS). Antonieta Iba, MD Taking Active   nystatin (MYCOSTATIN) 100000 UNIT/ML suspension [id number] Yes Take 5 mLs (500,000 Units total) by mouth 4 (four) times daily. Flinchum, Eula Fried, FNP Taking Active   ondansetron (ZOFRAN) 4 MG tablet [id number] Yes Take 1 tablet (4 mg total) by mouth every 8 (eight) hours as needed for nausea or vomiting. Sherlene Shams, MD Taking Active   pantoprazole (PROTONIX) 40 MG tablet [id number] Yes Take 1 tablet (40 mg total) by mouth 2 (two) times daily before a meal. Sherlene Shams, MD Taking Active   Probiotic Product (PROBIOTIC PO) [id number] Yes Take 1 capsule by mouth daily. [provider] Taking  Active Self  propranolol (INDERAL) 20 MG tablet [id number]  TAKE ONE TABLET 3 TIMES DAILY AS NEEDED FOR TACHYCARDIA Gollan, Tollie Pizza, MD  Active            Med Note [name]   Mon Mar 15, 2021  1:27 PM) Hasn't needed in long time per patient  ranolazine (RANEXA) 500 MG 12 hr tablet [id number] Yes Take 1  tablet (500 mg total) by mouth 2 (two) times daily. Antonieta Iba, MD Taking Active   vitamin B-12 (CYANOCOBALAMIN) 500 MCG tablet [id number] Yes Take 500 mcg by mouth daily. [provider] Taking Active Self            Home Care and Equipment/Supplies: Were Home Health Services Ordered?: NA Any new equipment or medical supplies ordered?: NA  Functional Questionnaire: Do you need assistance with bathing/showering or dressing?: No Do you need assistance with meal preparation?: Yes Do you need assistance with eating?: No Do you have difficulty maintaining continence: No Do you need assistance with getting out of bed/getting out of a chair/moving?: No Do you have difficulty managing or taking your medications?: No  Follow up appointments reviewed: PCP Follow-up appointment confirmed?: Yes Date of PCP follow-up appointment?: 04/27/23 Follow-up Provider: Dr Carolyn Stare NP [facility] Follow-up appointment confirmed?: NA Do you need transportation to your follow-up appointment?: No Do you understand care options if your condition(s) worsen?: Yes-patient verbalized understanding  SDOH Interventions Today    Flowsheet Row Most Recent Value  SDOH Interventions   Food Insecurity Interventions Intervention Not Indicated  Housing Interventions Intervention Not Indicated  Transportation Interventions Intervention Not Indicated, Patient Resources (Friends/Family)      Interventions Today    Flowsheet Row Most Recent Value  General Interventions   General Interventions Discussed/Reviewed General Interventions Discussed, General Interventions Reviewed, Doctor Visits  Doctor Visits Discussed/Reviewed Doctor Visits Discussed, Doctor Visits Reviewed  Pharmacy Interventions   Pharmacy Dicussed/Reviewed Pharmacy Topics Discussed      TOC Interventions Today    Flowsheet Row Most Recent Value  TOC Interventions   TOC Interventions Discussed/Reviewed TOC  Interventions Reviewed, TOC Interventions Discussed, Arranged PCP follow up within 7 days/Care Guide scheduled        [name] BSN RN [facility] [phone]

## 2023-04-25 NOTE — Chronic Care Management (AMB) (Signed)
   04/25/2023  [name] 05/05/1926 [id number]   Reason for Encounter: Patient is not currently enrolled in the CCM program. CCM status changed to previously enrolled  Alto Denver RN, MSN, CCM RN Care Manager  [facility]  Ambulatory Care Management  Direct Number: [phone]

## 2023-04-27 ENCOUNTER — Ambulatory Visit: Payer: No Typology Code available for payment source | Admitting: Nurse Practitioner

## 2023-04-28 ENCOUNTER — Ambulatory Visit (INDEPENDENT_AMBULATORY_CARE_PROVIDER_SITE_OTHER): Payer: No Typology Code available for payment source | Admitting: Nurse Practitioner

## 2023-04-28 ENCOUNTER — Encounter: Payer: Self-pay | Admitting: Nurse Practitioner

## 2023-04-28 VITALS — BP 102/70 | HR 75 | Temp 97.9°F | Ht 63.0 in | Wt 120.0 lb

## 2023-04-28 DIAGNOSIS — R42 Dizziness and giddiness: Secondary | ICD-10-CM

## 2023-04-28 DIAGNOSIS — I10 Essential (primary) hypertension: Secondary | ICD-10-CM

## 2023-04-28 NOTE — Progress Notes (Unsigned)
Established Patient Office Visit  Subjective:  Patient ID: [name], female    DOB: 28-Oct-1925  Age: 89 y.o. MRN: [id number]  CC:  Chief Complaint  Patient presents with   Follow-up    ED FU Dizzy x 2 weeks worse than usual    HPI  [name] presents for ED follow on recurrent episodes of dizziness.  She ambulates using a cane.  Patient is accompanied by her daughter.  Patient lives by herself but the daughter lives nearby.  Pt states that she has been feeling dizzy off and on.  She states that she has no dizziness when she is staying still, reading a book or walking.  She states that the dizziness is mainly when she changes position or goes from lying down, sitting down to standing up position.  She denies any headache, numbness, tingling, chest pain. She states that she has been increased her p.o. intake.  She is unclear about her medication patient states that she takes losartan 100 mg daily.  She was prescribed propranolol for tachycardia but states has not taken it in a while she is unclear about amlodipine. HPI   Past Medical History:  Diagnosis Date   C. difficile colitis    CAD (coronary artery disease)    a. s/p CABG 1999; b. Nuclear 10/11, no scar or ischemia, EF 68%; b. Lexiscan Myoview 06/18/14: no evidence of infarction or ischemia, EF 77%, low risk study; c. 02/2019 MV: EF 56%, ? mild lat ischemia->low risk.   Carotid artery disease (HCC)    a. Doppler January, 2012, 40-59% bilateral; b. 02/2017 U/S: 1-39% bilat ICA stenosis.   Closed fracture pubis (HCC) 07/04/2017   COPD (chronic obstructive pulmonary disease) (HCC)    Diffuse cystic mastopathy    Diverticulitis    last colonoscopy  incomplete Jan 2011   Dizziness    stable now (Oct 2011) patient tells me question of TIA, we will obtain records   Gait abnormality    Patient complains of "wobbly gait", December, 2013   GERD (gastroesophageal reflux disease)    History of migraines    Hx of CABG     a. 3 vessel in 1999   Hyperlipidemia    Hypertension    Indigestion    3 weeks, October 2011   Kidney stones    Rheumatic fever    SCC (squamous cell carcinoma), face 08/01/2016   Sinus bradycardia    Mild, December, 2013   SOB (shortness of breath)    Syncopal episodes    after 18 holes of golf and increase hydrochlorothiazide, resolved     Past Surgical History:  Procedure Laterality Date   APPENDECTOMY  1950   BREAST BIOPSY Right 1994   BREAST CYST ASPIRATION     multiple BIL   CATARACT EXTRACTION Right 2009   CATARACT EXTRACTION Left 2011   COLONOSCOPY  [id number]   Dr. Evette Cristal   CORONARY ARTERY BYPASS GRAFT  1999   esophagus stretched     TONSILLECTOMY  1939   TOTAL ABDOMINAL HYSTERECTOMY  1968   due to metrorrhagia    Family History  Problem Relation Age of Onset   Heart disease Mother    Heart disease Father    Cancer Neg Hx    Drug abuse Neg Hx    Breast cancer Neg Hx     Social History   Socioeconomic History   Marital status: Widowed    Spouse name: Not on file   Number of  children: 2   Years of education: Not on file   Highest education level: Not on file  Occupational History    Employer: RETIRED  Tobacco Use   Smoking status: Former    Current packs/day: 0.00    Average packs/day: 0.5 packs/day for 40.0 years (20.0 ttl pk-yrs)    Types: Cigarettes    Start date: 09/20/1943    Quit date: 09/20/1983    Years since quitting: 39.6   Smokeless tobacco: Never  Vaping Use   Vaping status: Never Used  Substance and Sexual Activity   Alcohol use: Not Currently    Comment: yes, social wine   Drug use: No   Sexual activity: Never  Other Topics Concern   Not on file  Social History Narrative   She is widowed,  Lives independently , ambulates independently at baseline   Social Determinants of Health   Financial Resource Strain: Low Risk  (02/20/2023)   Overall Financial Resource Strain (CARDIA)    Difficulty of Paying Living Expenses: Not hard  at all  Food Insecurity: No Food Insecurity (04/25/2023)   Hunger Vital Sign    Worried About Running Out of Food in the Last Year: Never true    Ran Out of Food in the Last Year: Never true  Transportation Needs: No Transportation Needs (04/25/2023)   PRAPARE - Administrator, Civil Service (Medical): No    Lack of Transportation (Non-Medical): No  Physical Activity: Insufficiently Active (02/20/2023)   Exercise Vital Sign    Days of Exercise per Week: 3 days    Minutes of Exercise per Session: 30 min  Stress: No Stress Concern Present (02/20/2023)   [employer] of Occupational Health - Occupational Stress Questionnaire    Feeling of Stress : Not at all  Social Connections: Socially Isolated (02/20/2023)   Social Connection and Isolation Panel [NHANES]    Frequency of Communication with Friends and Family: Not on file    Frequency of Social Gatherings with Friends and Family: More than three times a week    Attends Religious Services: Never    Database administrator or Organizations: No    Attends Banker Meetings: Never    Marital Status: Widowed  Intimate Partner Violence: Not At Risk (02/20/2023)   Humiliation, Afraid, Rape, and Kick questionnaire    Fear of Current or Ex-Partner: No    Emotionally Abused: No    Physically Abused: No    Sexually Abused: No     Outpatient Medications Prior to Visit  Medication Sig Dispense Refill   atorvastatin (LIPITOR) 40 MG tablet Take 1 tablet (40 mg total) by mouth daily. 90 tablet 1   Bacillus Coagulans-Inulin (PROBIOTIC) 1-250 BILLION-MG CAPS Take 1 capsule by mouth daily. 30 capsule 0   clopidogrel (PLAVIX) 75 MG tablet Take 1 tablet (75 mg total) by mouth daily. 90 tablet 3   fluticasone (FLONASE) 50 MCG/ACT nasal spray Place 2 sprays into both nostrils daily. 11.1 mL 2   furosemide (LASIX) 20 MG tablet TAKE 1 TABLET BY MOUTH NOT MORE THAN ONCE A WEEK AS NEEDED 12 tablet 3   ipratropium-albuterol (DUONEB) 0.5-2.5  (3) MG/3ML SOLN Take 3 mLs by nebulization every 6 (six) hours as needed. 360 mL 2   isosorbide mononitrate (IMDUR) 60 MG 24 hr tablet TAKE ONE AND ONE-HALF TABLET BY MOUTH TWICE DAILY 270 tablet 0   losartan (COZAAR) 100 MG tablet Take 1 tablet (100 mg total) by mouth daily. 90 tablet 3  mirtazapine (REMERON) 15 MG tablet TAKE 1 TABLET BY MOUTH NIGHTLY 90 tablet 1   pantoprazole (PROTONIX) 40 MG tablet Take 1 tablet (40 mg total) by mouth 2 (two) times daily before a meal. 180 tablet 3   Probiotic Product (PROBIOTIC PO) Take 1 capsule by mouth daily.     propranolol (INDERAL) 20 MG tablet TAKE ONE TABLET 3 TIMES DAILY AS NEEDED FOR TACHYCARDIA 270 tablet 3   ranolazine (RANEXA) 500 MG 12 hr tablet Take 1 tablet (500 mg total) by mouth 2 (two) times daily. 180 tablet 3   vitamin B-12 (CYANOCOBALAMIN) 500 MCG tablet Take 500 mcg by mouth daily.     magic mouthwash (lidocaine, diphenhydrAMINE, alum & mag hydroxide) suspension Swish and spit 5 mLs 4 (four) times daily as needed for mouth pain. 360 mL 1   meclizine (ANTIVERT) 25 MG tablet Take 1 tablet (25 mg total) by mouth 3 (three) times daily as needed for dizziness. 90 tablet 3   nitroGLYCERIN (NITROSTAT) 0.4 MG SL tablet PLACE 1 TABLET UNDER TONGUE EVERY 5 MINUTES AS NEEDED FOR CHEST PAIN. IF NO RELIEF IN 15 MINUTES CALL 911 ( MAXIMUM 3 TABLETS). 25 tablet 3   nystatin (MYCOSTATIN) 100000 UNIT/ML suspension Take 5 mLs (500,000 Units total) by mouth 4 (four) times daily. 60 mL 0   ondansetron (ZOFRAN) 4 MG tablet Take 1 tablet (4 mg total) by mouth every 8 (eight) hours as needed for nausea or vomiting. 40 tablet 3   No facility-administered medications prior to visit.    No Known Allergies  ROS Review of Systems Negative unless indicated in HPI.    Objective:    Physical Exam Constitutional:      Appearance: Normal appearance.  HENT:     Right Ear: Tympanic membrane normal. Tympanic membrane is not erythematous.     Left Ear:  Tympanic membrane normal. Tympanic membrane is not erythematous.     Nose:     Right Turbinates: Not enlarged.     Left Turbinates: Not enlarged.     Right Sinus: No maxillary sinus tenderness or frontal sinus tenderness.     Left Sinus: No maxillary sinus tenderness or frontal sinus tenderness.     Mouth/Throat:     Mouth: Mucous membranes are moist.     Pharynx: No pharyngeal swelling, oropharyngeal exudate or posterior oropharyngeal erythema.     Tonsils: No tonsillar exudate.  Cardiovascular:     Rate and Rhythm: Normal rate and regular rhythm.  Pulmonary:     Effort: Pulmonary effort is normal.     Breath sounds: Normal breath sounds. No stridor. No wheezing.  Neurological:     General: No focal deficit present.     Mental Status: She is alert and oriented to person, place, and time. Mental status is at baseline.  Psychiatric:        Mood and Affect: Mood normal.        Behavior: Behavior normal.        Thought Content: Thought content normal.        Judgment: Judgment normal.     BP 102/70   Pulse 75   Temp 97.9 F (36.6 C)   Ht 5\' 3"  (1.6 m)   Wt 120 lb (54.4 kg)   SpO2 97%   BMI 21.26 kg/m  Wt Readings from Last 3 Encounters:  04/28/23 120 lb (54.4 kg)  04/19/23 120 lb (54.4 kg)  02/27/23 121 lb 3.2 oz (55 kg)     Health Maintenance  Topic Date Due   COVID-19 Vaccine (7 - 2023-24 season) 12/03/2022   DTaP/Tdap/Td (2 - Td or Tdap) 02/08/2023   INFLUENZA VACCINE  12/18/2023 (Originally 04/20/2023)   Medicare Annual Wellness (AWV)  02/20/2024   Pneumonia Vaccine 78+ Years old  Completed   DEXA SCAN  Completed   Zoster Vaccines- Shingrix  Completed   HPV VACCINES  Aged Out    There are no preventive care reminders to display for this patient.  Lab Results  Component Value Date   TSH 5.39 01/06/2022   Lab Results  Component Value Date   WBC 7.3 04/19/2023   HGB 12.0 04/19/2023   HCT 37.4 04/19/2023   MCV 106.6 (H) 04/19/2023   PLT 185 04/19/2023    Lab Results  Component Value Date   NA 138 04/19/2023   K 5.0 04/19/2023   CO2 25 04/19/2023   GLUCOSE 111 (H) 04/19/2023   BUN 27 (H) 04/19/2023   CREATININE 1.71 (H) 04/19/2023   BILITOT 1.2 02/06/2023   ALKPHOS 58 02/06/2023   AST 25 02/06/2023   ALT 10 02/06/2023   PROT 6.1 (L) 02/06/2023   ALBUMIN 3.9 02/06/2023   CALCIUM 8.2 (L) 04/19/2023   ANIONGAP 7 04/19/2023   GFR 28.33 (L) 11/24/2022   Lab Results  Component Value Date   CHOL 138 01/06/2022   Lab Results  Component Value Date   HDL 59.50 01/06/2022   Lab Results  Component Value Date   LDLCALC 60 01/06/2022   Lab Results  Component Value Date   TRIG 93.0 01/06/2022   Lab Results  Component Value Date   CHOLHDL 2 01/06/2022   Lab Results  Component Value Date   HGBA1C 5.8 01/30/2017      Assessment & Plan:  There are no diagnoses linked to this encounter.  Follow-up: No follow-ups on file.   Kara Dies, NP

## 2023-04-28 NOTE — Patient Instructions (Signed)
Monitor BP and call with the blood pressure result. Call to schedule a f/u with Dr. Darrick Huntsman.

## 2023-05-01 ENCOUNTER — Encounter: Payer: Self-pay | Admitting: Internal Medicine

## 2023-05-01 ENCOUNTER — Inpatient Hospital Stay: Payer: No Typology Code available for payment source | Admitting: Family

## 2023-05-01 ENCOUNTER — Telehealth: Payer: Self-pay | Admitting: Internal Medicine

## 2023-05-01 NOTE — Telephone Encounter (Signed)
Patients daughter will send MYChart with BP readings since Friday , NP wanted patient to have follow up with Dr. Darrick Huntsman for BP and dizziness.

## 2023-05-01 NOTE — Telephone Encounter (Signed)
Pt daughter called in asking to speak to [facility] CMA or Olegario Messier RN concerning her mom. Her best contact number is [phone]. She did not provided me with additional message.

## 2023-05-02 ENCOUNTER — Encounter: Payer: Self-pay | Admitting: Internal Medicine

## 2023-05-02 NOTE — Telephone Encounter (Signed)
Pt daughter would like to be called concerning the BP readings

## 2023-05-02 NOTE — Telephone Encounter (Signed)
Pt was seen by Kara Dies, NP on Friday. She was advised to check her bp over the weekend and send [place] the readings.

## 2023-05-03 ENCOUNTER — Other Ambulatory Visit: Payer: Self-pay | Admitting: Internal Medicine

## 2023-05-03 DIAGNOSIS — R42 Dizziness and giddiness: Secondary | ICD-10-CM

## 2023-05-03 NOTE — Telephone Encounter (Signed)
Pt's daughter called back because she still has not heard anything. I explained everything to her and asked if the NP changed any medication on Friday during the appt. The daughter stated that no medication changes were made they were just advised to go home check bp over the weekend and follow up with Dr. Darrick Huntsman but there was no medication changes. I have also scheduled pt for the first available appt on 05/30/2023 at 4:30.

## 2023-05-03 NOTE — Telephone Encounter (Signed)
Spoke with pt's daughter and scheduled her for a lab appt tomorrow.

## 2023-05-04 ENCOUNTER — Encounter (INDEPENDENT_AMBULATORY_CARE_PROVIDER_SITE_OTHER): Payer: Self-pay

## 2023-05-04 ENCOUNTER — Other Ambulatory Visit (INDEPENDENT_AMBULATORY_CARE_PROVIDER_SITE_OTHER): Payer: No Typology Code available for payment source

## 2023-05-04 DIAGNOSIS — R42 Dizziness and giddiness: Secondary | ICD-10-CM | POA: Diagnosis not present

## 2023-05-05 LAB — CBC WITH DIFFERENTIAL/PLATELET
Basophils Absolute: 0.1 10*3/uL (ref 0.0–0.1)
Basophils Relative: 1.4 % (ref 0.0–3.0)
Eosinophils Absolute: 0.1 10*3/uL (ref 0.0–0.7)
Eosinophils Relative: 2.1 % (ref 0.0–5.0)
HCT: 36.8 % (ref 36.0–46.0)
Hemoglobin: 12.1 g/dL (ref 12.0–15.0)
Lymphocytes Relative: 17 % (ref 12.0–46.0)
Lymphs Abs: 1.1 10*3/uL (ref 0.7–4.0)
MCHC: 33 g/dL (ref 30.0–36.0)
MCV: 105.2 fl — ABNORMAL HIGH (ref 78.0–100.0)
Monocytes Absolute: 0.8 10*3/uL (ref 0.1–1.0)
Monocytes Relative: 11.6 % (ref 3.0–12.0)
Neutro Abs: 4.4 10*3/uL (ref 1.4–7.7)
Neutrophils Relative %: 67.9 % (ref 43.0–77.0)
Platelets: 170 10*3/uL (ref 150.0–400.0)
RBC: 3.5 Mil/uL — ABNORMAL LOW (ref 3.87–5.11)
RDW: 12.4 % (ref 11.5–15.5)
WBC: 6.5 10*3/uL (ref 4.0–10.5)

## 2023-05-05 LAB — BASIC METABOLIC PANEL
BUN: 24 mg/dL — ABNORMAL HIGH (ref 6–23)
CO2: 27 meq/L (ref 19–32)
Calcium: 8.6 mg/dL (ref 8.4–10.5)
Chloride: 103 meq/L (ref 96–112)
Creatinine, Ser: 1.67 mg/dL — ABNORMAL HIGH (ref 0.40–1.20)
GFR: 26.02 mL/min — ABNORMAL LOW (ref >60.00)
Glucose, Bld: 117 mg/dL — ABNORMAL HIGH (ref 70–99)
Potassium: 4.8 meq/L (ref 3.5–5.1)
Sodium: 138 meq/L (ref 135–145)

## 2023-05-05 LAB — B12 AND FOLATE PANEL
Folate: 10.4 ng/mL (ref >5.9)
Vitamin B-12: 174 pg/mL — ABNORMAL LOW (ref 211–911)

## 2023-05-06 NOTE — Assessment & Plan Note (Signed)
Patient BP 102/70  Advised pt to follow a low sodium and heart healthy diet. Diet

## 2023-05-07 NOTE — Assessment & Plan Note (Signed)
Has orthostatic hypotension. Advised patient to check blood pressure at home and send readings in a week. Continue losartan 100 mg. Will continue to monitor, if needed would change medication regimen. Advised patient to follow-up with PCP.

## 2023-05-08 ENCOUNTER — Telehealth: Payer: Self-pay | Admitting: Internal Medicine

## 2023-05-08 NOTE — Telephone Encounter (Signed)
Patient called to see if they could get a oral b supplement. And she also wants to know if [facility] could give the b12 injections. The patient's daughter's number is [phone]. Could someone call her.

## 2023-05-09 ENCOUNTER — Ambulatory Visit (INDEPENDENT_AMBULATORY_CARE_PROVIDER_SITE_OTHER): Payer: No Typology Code available for payment source

## 2023-05-09 ENCOUNTER — Other Ambulatory Visit: Payer: Self-pay | Admitting: Internal Medicine

## 2023-05-09 ENCOUNTER — Other Ambulatory Visit: Payer: Self-pay | Admitting: Cardiovascular Disease

## 2023-05-09 DIAGNOSIS — E538 Deficiency of other specified B group vitamins: Secondary | ICD-10-CM

## 2023-05-09 MED ORDER — CYANOCOBALAMIN 1000 MCG/ML IJ SOLN
1000.0000 ug | Freq: Once | INTRAMUSCULAR | Status: AC
Start: 2023-05-09 — End: 2023-05-09
  Administered 2023-05-09: 1000 ug via INTRAMUSCULAR

## 2023-05-09 MED ORDER — CYANOCOBALAMIN 1000 MCG/ML IJ SOLN: 1000.0000 ug | INTRAMUSCULAR | 0 refills | Status: DC

## 2023-05-09 NOTE — Progress Notes (Signed)
Pt came in for 1st b12 injection. Pt  was with her daughter, all questions and concerns where answer. Pt daughter will schedule the next 3 appts for the weekly shots. Pt's daughter stated she would have pt come here for the injections for now.  B12 injection was given in left deltoid. Pt tolerated shot well.

## 2023-05-09 NOTE — Telephone Encounter (Signed)
Daughter can call [facility] to find out if they will administer the weekly b12 injections.  The oral b12 are available otc

## 2023-05-10 ENCOUNTER — Encounter: Payer: Self-pay | Admitting: Internal Medicine

## 2023-05-10 NOTE — Telephone Encounter (Signed)
Pt is aware and gave a verbal understanding. She stated that if they could not give to her at pharmacy she would let us know.

## 2023-05-11 ENCOUNTER — Encounter: Payer: Self-pay | Admitting: Internal Medicine

## 2023-05-16 ENCOUNTER — Ambulatory Visit: Payer: No Typology Code available for payment source

## 2023-05-25 ENCOUNTER — Ambulatory Visit: Payer: No Typology Code available for payment source

## 2023-05-30 ENCOUNTER — Ambulatory Visit (INDEPENDENT_AMBULATORY_CARE_PROVIDER_SITE_OTHER): Payer: No Typology Code available for payment source | Admitting: Internal Medicine

## 2023-05-30 ENCOUNTER — Encounter: Payer: Self-pay | Admitting: Internal Medicine

## 2023-05-30 VITALS — BP 130/58 | HR 73 | Ht 63.0 in | Wt 121.0 lb

## 2023-05-30 DIAGNOSIS — E538 Deficiency of other specified B group vitamins: Secondary | ICD-10-CM

## 2023-05-30 DIAGNOSIS — K5904 Chronic idiopathic constipation: Secondary | ICD-10-CM

## 2023-05-30 DIAGNOSIS — M6281 Muscle weakness (generalized): Secondary | ICD-10-CM | POA: Diagnosis not present

## 2023-05-30 DIAGNOSIS — G3184 Mild cognitive impairment, so stated: Secondary | ICD-10-CM | POA: Diagnosis not present

## 2023-05-30 DIAGNOSIS — N1832 Chronic kidney disease, stage 3b: Secondary | ICD-10-CM

## 2023-05-30 MED ORDER — CYANOCOBALAMIN 1000 MCG/ML IJ SOLN
1000.0000 ug | INTRAMUSCULAR | 3 refills | Status: DC
Start: 2023-05-30 — End: 2023-11-28

## 2023-05-30 NOTE — Assessment & Plan Note (Signed)
Secondary to deconditioning and aging.  Encouraged to increase PT two twice weekly.  Strongly advised her to change bedroom to downstairs to minimize risk of catstrophic fall

## 2023-05-30 NOTE — Assessment & Plan Note (Addendum)
Reviewed prior ER visit labs with no change in GFR  ;  in the past    Nephrology referral declined.  She is avoiding NSAIDs

## 2023-05-30 NOTE — Progress Notes (Signed)
Subjective:  Patient ID: [name], female    DOB: 1926/02/08  Age: 89 y.o. MRN: [id number]  CC: The primary encounter diagnosis was B12 deficiency. Diagnoses of Stage 3b chronic kidney disease (HCC), Mild cognitive impairment with memory loss, Generalized muscle weakness, and Chronic idiopathic constipation were also pertinent to this visit.   HPI [name] presents for  Chief Complaint  Patient presents with   Medical Management of Chronic Issues   1) AORTIC ATHEROSCLEROSIS   2) copd  3) DEPRESSION, ANOREXIA:  WEIGHT STABLE ON REMERON   4) B12 DEFICIENCY FOUND LAST MONTH 4TH INJECTION WAS DONE TODAY.  WALKIG WITH A CANE  AND LIVING DOWN STAIRS .  NO FALLS.   5) MACULAR DEGENERATION BILATERALLY. DIAGNOSED,  USING MAGNIFYING LENSES FOR READING  AND TV.     Outpatient Medications Prior to Visit  Medication Sig Dispense Refill   atorvastatin (LIPITOR) 40 MG tablet Take 1 tablet (40 mg total) by mouth daily. 90 tablet 1   Bacillus Coagulans-Inulin (PROBIOTIC) 1-250 BILLION-MG CAPS Take 1 capsule by mouth daily. 30 capsule 0   clopidogrel (PLAVIX) 75 MG tablet Take 1 tablet (75 mg total) by mouth daily. 90 tablet 3   fluticasone (FLONASE) 50 MCG/ACT nasal spray Place 2 sprays into both nostrils daily. 11.1 mL 2   furosemide (LASIX) 20 MG tablet TAKE 1 TABLET BY MOUTH NOT MORE THAN ONCE A WEEK AS NEEDED 12 tablet 3   ipratropium-albuterol (DUONEB) 0.5-2.5 (3) MG/3ML SOLN Take 3 mLs by nebulization every 6 (six) hours as needed. 360 mL 2   isosorbide mononitrate (IMDUR) 60 MG 24 hr tablet TAKE ONE AND ONE-HALF TABLET BY MOUTH TWICE DAILY 270 tablet 0   losartan (COZAAR) 100 MG tablet Take 1 tablet (100 mg total) by mouth daily. 90 tablet 3   mirtazapine (REMERON) 15 MG tablet TAKE 1 TABLET BY MOUTH NIGHTLY 90 tablet 1   pantoprazole (PROTONIX) 40 MG tablet Take 1 tablet (40 mg total) by mouth 2 (two) times daily before a meal. 180 tablet 3   Probiotic Product  (PROBIOTIC PO) Take 1 capsule by mouth daily.     propranolol (INDERAL) 20 MG tablet TAKE ONE TABLET 3 TIMES DAILY AS NEEDED FOR TACHYCARDIA 270 tablet 3   ranolazine (RANEXA) 500 MG 12 hr tablet Take 1 tablet (500 mg total) by mouth 2 (two) times daily. 180 tablet 3   vitamin B-12 (CYANOCOBALAMIN) 500 MCG tablet Take 500 mcg by mouth daily.     cyanocobalamin (VITAMIN B12) 1000 MCG/ML injection Inject 1 mL (1,000 mcg total) into the muscle once a week. FOR 4 WEEKS . 4 mL 0   No facility-administered medications prior to visit.    Review of Systems;  Patient denies headache, fevers, malaise, unintentional weight loss, skin rash, eye pain, sinus congestion and sinus pain, sore throat, dysphagia,  hemoptysis , cough, dyspnea, wheezing, chest pain, palpitations, orthopnea, edema, abdominal pain, nausea, melena, diarrhea, constipation, flank pain, dysuria, hematuria, urinary  Frequency, nocturia, numbness, tingling, seizures,  Focal weakness, Loss of consciousness,  Tremor, insomnia, depression, anxiety, and suicidal ideation.      Objective:  BP (!) 130/58   Pulse 73   Ht 5\' 3"  (1.6 m)   Wt 121 lb (54.9 kg)   SpO2 95%   BMI 21.43 kg/m   BP Readings from Last 3 Encounters:  05/30/23 (!) 130/58  04/28/23 102/70  04/19/23 (!) 194/65    Wt Readings from Last 3 Encounters:  05/30/23 121  lb (54.9 kg)  04/28/23 120 lb (54.4 kg)  04/19/23 120 lb (54.4 kg)    Physical Exam  Lab Results  Component Value Date   HGBA1C 5.8 01/30/2017   HGBA1C 5.8 09/26/2016    Lab Results  Component Value Date   CREATININE 1.67 (H) 05/04/2023   CREATININE 1.71 (H) 04/19/2023   CREATININE 1.57 (H) 02/06/2023    Lab Results  Component Value Date   WBC 6.5 05/04/2023   HGB 12.1 05/04/2023   HCT 36.8 05/04/2023   PLT 170.0 05/04/2023   GLUCOSE 117 (H) 05/04/2023   CHOL 138 01/06/2022   TRIG 93.0 01/06/2022   HDL 59.50 01/06/2022   LDLDIRECT 72.0 11/09/2015   LDLCALC 60 01/06/2022   ALT 10  02/06/2023   AST 25 02/06/2023   NA 138 05/04/2023   K 4.8 05/04/2023   CL 103 05/04/2023   CREATININE 1.67 (H) 05/04/2023   BUN 24 (H) 05/04/2023   CO2 27 05/04/2023   TSH 5.39 01/06/2022   INR 0.9 01/31/2018   HGBA1C 5.8 01/30/2017   MICROALBUR 2.5 (H) 12/26/2011    DG Chest Portable 1 View  Result Date: 04/19/2023 CLINICAL DATA:  Dizziness EXAM: PORTABLE CHEST 1 VIEW COMPARISON:  X-ray 05/14/2022 and older FINDINGS: Hyperinflation. Apical pleural thickening. Postop chest. Presumed hiatal hernia. No consolidation, pneumothorax or effusion. No edema. Normal cardiopericardial silhouette. Overlapping cardiac leads. IMPRESSION: Hyperinflation.  Chronic changes.  Hiatal hernia.  Postop chest Electronically Signed   By: Karen Kays M.D.   On: 04/19/2023 17:43    Assessment & Plan:  .B12 deficiency -     Cyanocobalamin; Inject 1 mL (1,000 mcg total) into the muscle every 30 (thirty) days. FOR 4 WEEKS .  Dispense: 4 mL; Refill: 3 -     Intrinsic Factor Antibodies; Future  Stage 3b chronic kidney disease [facility]) Assessment & Plan: Reviewed prior ER visit labs with no change in GFR  ;  in the past    Nephrology referral declined.  She is avoiding NSAIDs   Mild cognitive impairment with memory loss Assessment & Plan: Secondary to vascular dementia    Generalized muscle weakness Assessment & Plan: Secondary to deconditioning and aging.  Encouraged to increase PT two twice weekly.  Strongly advised her to change bedroom to downstairs to minimize risk of catstrophic fall    Chronic idiopathic constipation Assessment & Plan: Advised to use BFL daily and limit use of stimulant laxatives to twice weekly prn       I provided 35 minutes of face-to-face time during this encounter reviewing patient's last visit with me, patient's  most recent visit with cardiology,  nephrology,  recent surgical and non surgical procedures, previous  labs and imaging studies, counseling on currently  addressed issues,  and post visit ordering to diagnostics and therapeutics .   Follow-up: Return in about 6 months (around 11/27/2023).   Sherlene Shams, MD

## 2023-05-30 NOTE — Assessment & Plan Note (Signed)
Secondary to vascular dementia  ?

## 2023-05-30 NOTE — Patient Instructions (Addendum)
  There are 4 categories of laxatives.  They can be combined,  But some should not be used daily .  Bulk  forming laxatives  ARE FINE to use daily:    Citrucel, benefiber, metamucil) CAN BE STIRRED INTO COFFEE  Stool softener (docusate ; there's only one) :  ok to use daily  (NAME  BRAND IS COLACE)   Stimulant laxatives (Ex Lax,  Correctol,  Senna,  Dulcolax)  :  SHOULD BE AVOIDED on a daily basis, AND USED NOT ORE THAN  2/weeK IF THE METAMUCIL DOESN'T WORK   Cathartic laxatives ( MOM,  Mag citrate, Lactulose ) : not more than 2 /week   Ok to take plain  magnesium in capsule form   With or without the stool softener daily     CONTINUE MONTHLY B12 SHOTS AT [facility]    MAKE A LAB APPT IN THE NEXT MOTH OR SO FOR AN INTRINSIC FACTOR ANTIBODY

## 2023-05-30 NOTE — Assessment & Plan Note (Signed)
Advised to use BFL daily and limit use of stimulant laxatives to twice weekly prn

## 2023-06-01 ENCOUNTER — Ambulatory Visit: Payer: No Typology Code available for payment source

## 2023-06-14 DIAGNOSIS — H353221 Exudative age-related macular degeneration, left eye, with active choroidal neovascularization: Secondary | ICD-10-CM | POA: Diagnosis not present

## 2023-06-23 ENCOUNTER — Other Ambulatory Visit: Payer: Self-pay

## 2023-06-23 MED ORDER — ATORVASTATIN CALCIUM 40 MG PO TABS: 40.0000 mg | ORAL_TABLET | Freq: Every day | ORAL | 1 refills | Status: DC

## 2023-06-29 ENCOUNTER — Telehealth: Payer: Self-pay | Admitting: Internal Medicine

## 2023-06-29 ENCOUNTER — Encounter: Payer: Self-pay | Admitting: Internal Medicine

## 2023-06-29 NOTE — Telephone Encounter (Signed)
[facility] called and needs this prescription atorvastatin (LIPITOR) 40 MG tablet filled for the patient. The pharmacy she uses is [facility] - [place], [place] - [address] [place], [place] [address] Phone: [phone]  Fax: [phone]  Her number is [phone]

## 2023-06-29 NOTE — Telephone Encounter (Signed)
Rx was sent in on 06/23/2023. Spoke with [facility] and they do have the rx.

## 2023-06-30 NOTE — Telephone Encounter (Signed)
Do you recommend the covid vaccine for pt?

## 2023-07-15 DIAGNOSIS — Z23 Encounter for immunization: Secondary | ICD-10-CM | POA: Diagnosis not present

## 2023-07-15 DIAGNOSIS — H6121 Impacted cerumen, right ear: Secondary | ICD-10-CM | POA: Diagnosis not present

## 2023-07-15 DIAGNOSIS — Z6821 Body mass index (BMI) 21.0-21.9, adult: Secondary | ICD-10-CM | POA: Diagnosis not present

## 2023-07-19 ENCOUNTER — Other Ambulatory Visit (INDEPENDENT_AMBULATORY_CARE_PROVIDER_SITE_OTHER): Payer: No Typology Code available for payment source

## 2023-07-19 DIAGNOSIS — E538 Deficiency of other specified B group vitamins: Secondary | ICD-10-CM | POA: Diagnosis not present

## 2023-07-26 LAB — INTRINSIC FACTOR ANTIBODIES: Intrinsic Factor: NEGATIVE

## 2023-08-14 ENCOUNTER — Other Ambulatory Visit: Payer: Self-pay | Admitting: Cardiovascular Disease

## 2023-08-16 DIAGNOSIS — H353221 Exudative age-related macular degeneration, left eye, with active choroidal neovascularization: Secondary | ICD-10-CM | POA: Diagnosis not present

## 2023-08-16 DIAGNOSIS — H353131 Nonexudative age-related macular degeneration, bilateral, early dry stage: Secondary | ICD-10-CM | POA: Diagnosis not present

## 2023-10-12 ENCOUNTER — Other Ambulatory Visit: Payer: Self-pay | Admitting: Cardiovascular Disease

## 2023-10-12 ENCOUNTER — Other Ambulatory Visit: Payer: Self-pay | Admitting: Internal Medicine

## 2023-10-12 NOTE — Telephone Encounter (Signed)
Hi,   Could you please schedule this patient an overdue 6 month follow up visit? The patient was last seen by Dr. Mariah Milling on 01-17-23. Thank you so much.

## 2023-10-16 NOTE — Telephone Encounter (Signed)
Pt scheduled on 4/11

## 2023-11-08 DIAGNOSIS — H353131 Nonexudative age-related macular degeneration, bilateral, early dry stage: Secondary | ICD-10-CM | POA: Diagnosis not present

## 2023-11-08 DIAGNOSIS — H353221 Exudative age-related macular degeneration, left eye, with active choroidal neovascularization: Secondary | ICD-10-CM | POA: Diagnosis not present

## 2023-11-08 DIAGNOSIS — H3582 Retinal ischemia: Secondary | ICD-10-CM | POA: Diagnosis not present

## 2023-11-27 ENCOUNTER — Ambulatory Visit: Payer: No Typology Code available for payment source | Admitting: Internal Medicine

## 2023-11-27 ENCOUNTER — Encounter: Payer: Self-pay | Admitting: Internal Medicine

## 2023-11-27 VITALS — BP 160/76 | HR 79 | Ht 63.0 in | Wt 120.2 lb

## 2023-11-27 DIAGNOSIS — N1832 Chronic kidney disease, stage 3b: Secondary | ICD-10-CM | POA: Diagnosis not present

## 2023-11-27 DIAGNOSIS — M6281 Muscle weakness (generalized): Secondary | ICD-10-CM | POA: Diagnosis not present

## 2023-11-27 DIAGNOSIS — Z9181 History of falling: Secondary | ICD-10-CM | POA: Diagnosis not present

## 2023-11-27 DIAGNOSIS — I10 Essential (primary) hypertension: Secondary | ICD-10-CM

## 2023-11-27 DIAGNOSIS — E538 Deficiency of other specified B group vitamins: Secondary | ICD-10-CM | POA: Diagnosis not present

## 2023-11-27 DIAGNOSIS — R2689 Other abnormalities of gait and mobility: Secondary | ICD-10-CM | POA: Diagnosis not present

## 2023-11-27 DIAGNOSIS — E441 Mild protein-calorie malnutrition: Secondary | ICD-10-CM | POA: Diagnosis not present

## 2023-11-27 MED ORDER — CYANOCOBALAMIN 1000 MCG/ML IJ SOLN
1000.0000 ug | Freq: Once | INTRAMUSCULAR | Status: AC
Start: 2023-11-27 — End: 2023-11-27
  Administered 2023-11-27: 1000 ug via INTRAMUSCULAR

## 2023-11-27 MED ORDER — PANTOPRAZOLE SODIUM 40 MG PO TBEC: 40.0000 mg | DELAYED_RELEASE_TABLET | Freq: Every day | ORAL | 1 refills | Status: DC

## 2023-11-27 MED ORDER — METOPROLOL SUCCINATE ER 25 MG PO TB24: 25.0000 mg | ORAL_TABLET | Freq: Every day | ORAL | 3 refills | Status: DC

## 2023-11-27 NOTE — Assessment & Plan Note (Signed)
 Recurrent,  low in August 2024.  IF ab negative   Lab Results  Component Value Date   VITAMINB12 174 (L) 05/04/2023

## 2023-11-27 NOTE — Patient Instructions (Signed)
 I am adding metoprolol XL 25 mg to current regimen for blood pressure control  The pantoprazole can be reduced to once daily (this is for reflux and gastritis )

## 2023-11-27 NOTE — Progress Notes (Unsigned)
 Subjective:  Patient ID: [name], female    DOB: June 24, 1926  Age: 89 y.o. MRN: [id number]  CC: There were no encounter diagnoses.   HPI [name] presents for  Chief Complaint  Patient presents with   Medical Management of Chronic Issues    6 month follow up    1) HN:  Hypertension: patient dose not check checks blood pressure at home.  Readings have been for the most part <130/80 at rest . Patient is following a reduced salt diet most days and is taking medications as prescribed : Imdur 60  mg , losartan 100 mg  2) history of fall:  slipped out of her shoes 2 weeks ago and fell onto left knee .  No bruises or pain.  Uses a cane.  Does not walk except when daughter takes her grocery shopping.  No longer driving secondary to advanced macular degeneration.    Family has moved her to the downstairs ONLY  3) B12 deficiency;  was low in August had seveal injections at [facility] but did not continue oral meds.  Was taking protonix bid for uncleare reasons       Outpatient Medications Prior to Visit  Medication Sig Dispense Refill   atorvastatin (LIPITOR) 40 MG tablet TAKE 1 TABLET BY MOUTH DAILY 90 tablet 1   Bacillus Coagulans-Inulin (PROBIOTIC) 1-250 BILLION-MG CAPS Take 1 capsule by mouth daily. 30 capsule 0   clopidogrel (PLAVIX) 75 MG tablet TAKE 1 TABLET BY MOUTH DAILY 90 tablet 0   cyanocobalamin (VITAMIN B12) 1000 MCG/ML injection Inject 1 mL (1,000 mcg total) into the muscle every 30 (thirty) days. FOR 4 WEEKS . 4 mL 3   fluticasone (FLONASE) 50 MCG/ACT nasal spray Place 2 sprays into both nostrils daily. 11.1 mL 2   furosemide (LASIX) 20 MG tablet TAKE 1 TABLET BY MOUTH NOT MORE THAN ONCE A WEEK AS NEEDED 12 tablet 3   ipratropium-albuterol (DUONEB) 0.5-2.5 (3) MG/3ML SOLN Take 3 mLs by nebulization every 6 (six) hours as needed. 360 mL 2   isosorbide mononitrate (IMDUR) 60 MG 24 hr tablet TAKE ONE AND ONE-HALF TABLET BY MOUTH TWICE DAILY 270 tablet 0    losartan (COZAAR) 100 MG tablet TAKE 1 TABLET BY MOUTH DAILY 90 tablet 0   mirtazapine (REMERON) 15 MG tablet TAKE 1 TABLET BY MOUTH NIGHTLY 90 tablet 1   pantoprazole (PROTONIX) 40 MG tablet Take 1 tablet (40 mg total) by mouth 2 (two) times daily before a meal. 180 tablet 3   Probiotic Product (PROBIOTIC PO) Take 1 capsule by mouth daily.     propranolol (INDERAL) 20 MG tablet TAKE ONE TABLET 3 TIMES DAILY AS NEEDED FOR TACHYCARDIA 270 tablet 3   ranolazine (RANEXA) 500 MG 12 hr tablet TAKE 1 TABLET BY MOUTH TWICE DAILY 180 tablet 0   vitamin B-12 (CYANOCOBALAMIN) 500 MCG tablet Take 500 mcg by mouth daily.     No facility-administered medications prior to visit.    Review of Systems;  Patient denies headache, fevers, malaise, unintentional weight loss, skin rash, eye pain, sinus congestion and sinus pain, sore throat, dysphagia,  hemoptysis , cough, dyspnea, wheezing, chest pain, palpitations, orthopnea, edema, abdominal pain, nausea, melena, diarrhea, constipation, flank pain, dysuria, hematuria, urinary  Frequency, nocturia, numbness, tingling, seizures,  Focal weakness, Loss of consciousness,  Tremor, insomnia, depression, anxiety, and suicidal ideation.      Objective:  BP (!) 160/76   Pulse 79   Ht 5\' 3"  (1.6 m)  Wt 120 lb 3.2 oz (54.5 kg)   SpO2 96%   BMI 21.29 kg/m   BP Readings from Last 3 Encounters:  11/27/23 (!) 160/76  05/30/23 (!) 130/58  04/28/23 102/70    Wt Readings from Last 3 Encounters:  11/27/23 120 lb 3.2 oz (54.5 kg)  05/30/23 121 lb (54.9 kg)  04/28/23 120 lb (54.4 kg)    Physical Exam  Lab Results  Component Value Date   HGBA1C 5.8 01/30/2017   HGBA1C 5.8 09/26/2016    Lab Results  Component Value Date   CREATININE 1.67 (H) 05/04/2023   CREATININE 1.71 (H) 04/19/2023   CREATININE 1.57 (H) 02/06/2023    Lab Results  Component Value Date   WBC 6.5 05/04/2023   HGB 12.1 05/04/2023   HCT 36.8 05/04/2023   PLT 170.0 05/04/2023    GLUCOSE 117 (H) 05/04/2023   CHOL 138 01/06/2022   TRIG 93.0 01/06/2022   HDL 59.50 01/06/2022   LDLDIRECT 72.0 11/09/2015   LDLCALC 60 01/06/2022   ALT 10 02/06/2023   AST 25 02/06/2023   NA 138 05/04/2023   K 4.8 05/04/2023   CL 103 05/04/2023   CREATININE 1.67 (H) 05/04/2023   BUN 24 (H) 05/04/2023   CO2 27 05/04/2023   TSH 5.39 01/06/2022   INR 0.9 01/31/2018   HGBA1C 5.8 01/30/2017   MICROALBUR 2.5 (H) 12/26/2011    DG Chest Portable 1 View Result Date: 04/19/2023 CLINICAL DATA:  Dizziness EXAM: PORTABLE CHEST 1 VIEW COMPARISON:  X-ray 05/14/2022 and older FINDINGS: Hyperinflation. Apical pleural thickening. Postop chest. Presumed hiatal hernia. No consolidation, pneumothorax or effusion. No edema. Normal cardiopericardial silhouette. Overlapping cardiac leads. IMPRESSION: Hyperinflation.  Chronic changes.  Hiatal hernia.  Postop chest Electronically Signed   By: Karen Kays M.D.   On: 04/19/2023 17:43    Assessment & Plan:  .There are no diagnoses linked to this encounter.   I spent 34 minutes on the day of this face to face encounter reviewing patient's  most recent visit with cardiology,  nephrology,  and neurology,  prior relevant surgical and non surgical procedures, recent  labs and imaging studies, counseling on weight management,  reviewing the assessment and plan with patient, and post visit ordering and reviewing of  diagnostics and therapeutics with patient  .   Follow-up: No follow-ups on file.   Sherlene Shams, MD

## 2023-11-28 ENCOUNTER — Encounter: Payer: Self-pay | Admitting: Internal Medicine

## 2023-11-28 DIAGNOSIS — Z9181 History of falling: Secondary | ICD-10-CM | POA: Insufficient documentation

## 2023-11-28 LAB — B12 AND FOLATE PANEL
Folate: 12.2 ng/mL (ref >5.9)
Vitamin B-12: 201 pg/mL — ABNORMAL LOW (ref 211–911)

## 2023-11-28 MED ORDER — CYANOCOBALAMIN 1000 MCG/ML IJ SOLN
1000.0000 ug | INTRAMUSCULAR | 3 refills | Status: DC
Start: 2023-11-28 — End: 2024-05-29

## 2023-11-28 NOTE — Assessment & Plan Note (Signed)
 Given her recent fall, I have ordered home PT to focus on balance and lower extremity strength

## 2023-11-28 NOTE — Addendum Note (Signed)
 Addended by: Sherlene Shams on: 11/28/2023 10:44 PM   Modules accepted: Orders

## 2023-11-28 NOTE — Assessment & Plan Note (Signed)
 Elevated today.  I have added metoprolol XL 25 mg (carvedilol CR was not on formulary)

## 2023-11-28 NOTE — Assessment & Plan Note (Signed)
 Recent fall 2 weeks ago was caused by her foot sliding out of an open backed shoe.  However she has become quite sedentary and lives alone. 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  Home PT ordered

## 2023-11-28 NOTE — Assessment & Plan Note (Signed)
 Appetite is diminisehd but weight is stable.   I have reviewed her diet  with patient and daughter ,  reminding her to maintain adequate protein and fat intake while monitoring her carbohydrates.

## 2023-11-28 NOTE — Assessment & Plan Note (Signed)
 Reviewed prior ER visit labs with no change in GFR  ;  in the past    Nephrology referral declined.  She is avoiding NSAIDs  Lab Results  Component Value Date   CREATININE 1.67 (H) 05/04/2023

## 2023-12-02 ENCOUNTER — Encounter: Payer: Self-pay | Admitting: Internal Medicine

## 2023-12-13 ENCOUNTER — Telehealth: Payer: Self-pay

## 2023-12-13 ENCOUNTER — Other Ambulatory Visit: Payer: Self-pay

## 2023-12-13 MED ORDER — LIDOCAINE VISCOUS HCL 2 % MT SOLN: 5.0000 mL | Freq: Four times a day (QID) | OROMUCOSAL | 1 refills | Status: DC | PRN

## 2023-12-13 NOTE — Telephone Encounter (Signed)
 Copied from CRM #[id number]. Topic: Clinical - Prescription Issue >> Dec 13, 2023  8:13 AM Fonda Kinder J wrote: Reason for CRM: The pts daughter called in stating the pts pharmacy is still waiting on a prior authorization from the pts PCP. I asked her which medication and she told me the most recent one but when I tried to confirm from the list of medications she stated she didn't know. She also advised me the pharmacy should have sent a request for the medication that needs approval. The pt has been waiting on this medication about 2 weeks now, please advise >> Dec 13, 2023  1:07 PM Fonda Kinder J wrote: The pts mother states she spoke with cma Shanda Bumps and was advised that they would be faxing over the information that was needed to the pharmacy. This was earlier today and the pts mother states the pharmacy still has not received it. She states she was under the impression it would be done right away Edit; Please disregard my apologies, I see a prescription was sent to the pharmacy in which I relayed to the pts daughter and advised her to give it some time to process and f/u with the pharmacy

## 2023-12-13 NOTE — Telephone Encounter (Signed)
 Copied from CRM [id number]. Topic: Clinical - Prescription Issue >> Dec 13, 2023  1:21 PM Taleah C wrote: Reason for CRM: [facility] called and stated that they only have a denial from Dr. Darrick Huntsman for the magic mouthwash. Even though her chart indicates it was sent in today. Please advise.

## 2023-12-13 NOTE — Telephone Encounter (Signed)
 Last refilled in 2023 Last OV: 11/27/2023 Next OV: 05/29/2024   Spoke with pt's daughter and she stated that they are requesting a refill because pt has a sore and pain in her mouth.

## 2023-12-13 NOTE — Telephone Encounter (Signed)
Medication has been resent

## 2023-12-13 NOTE — Addendum Note (Signed)
 Addended by: Sandy Salaam on: 12/13/2023 11:19 AM   Modules accepted: Orders

## 2023-12-13 NOTE — Telephone Encounter (Signed)
 Copied from CRM #[id number]. Topic: Clinical - Prescription Issue >> Dec 13, 2023  8:13 AM Fonda Kinder J wrote: Reason for CRM: The pts daughter called in stating the pts pharmacy is still waiting on a prior authorization from the pts PCP. I asked her which medication and she told me the most recent one but when I tried to confirm from the list of medications she stated she didn't know. She also advised me the pharmacy should have sent a request for the medication that needs approval. The pt has been waiting on this medication about 2 weeks now, please advise

## 2023-12-13 NOTE — Addendum Note (Signed)
 Addended by: Sandy Salaam on: 12/13/2023 01:28 PM   Modules accepted: Orders

## 2023-12-20 ENCOUNTER — Telehealth: Payer: Self-pay | Admitting: Cardiovascular Disease

## 2023-12-20 ENCOUNTER — Ambulatory Visit: Payer: Self-pay

## 2023-12-20 DIAGNOSIS — E559 Vitamin D deficiency, unspecified: Secondary | ICD-10-CM | POA: Diagnosis not present

## 2023-12-20 DIAGNOSIS — J449 Chronic obstructive pulmonary disease, unspecified: Secondary | ICD-10-CM | POA: Diagnosis not present

## 2023-12-20 DIAGNOSIS — I25118 Atherosclerotic heart disease of native coronary artery with other forms of angina pectoris: Secondary | ICD-10-CM | POA: Diagnosis not present

## 2023-12-20 DIAGNOSIS — F419 Anxiety disorder, unspecified: Secondary | ICD-10-CM | POA: Diagnosis not present

## 2023-12-20 DIAGNOSIS — N2581 Secondary hyperparathyroidism of renal origin: Secondary | ICD-10-CM | POA: Diagnosis not present

## 2023-12-20 DIAGNOSIS — E785 Hyperlipidemia, unspecified: Secondary | ICD-10-CM | POA: Diagnosis not present

## 2023-12-20 DIAGNOSIS — I130 Hypertensive heart and chronic kidney disease with heart failure and stage 1 through stage 4 chronic kidney disease, or unspecified chronic kidney disease: Secondary | ICD-10-CM | POA: Diagnosis not present

## 2023-12-20 DIAGNOSIS — E261 Secondary hyperaldosteronism: Secondary | ICD-10-CM | POA: Diagnosis not present

## 2023-12-20 DIAGNOSIS — I5032 Chronic diastolic (congestive) heart failure: Secondary | ICD-10-CM | POA: Diagnosis not present

## 2023-12-20 DIAGNOSIS — I700 Atherosclerosis of aorta: Secondary | ICD-10-CM | POA: Diagnosis not present

## 2023-12-20 DIAGNOSIS — E538 Deficiency of other specified B group vitamins: Secondary | ICD-10-CM | POA: Diagnosis not present

## 2023-12-20 DIAGNOSIS — N1832 Chronic kidney disease, stage 3b: Secondary | ICD-10-CM | POA: Diagnosis not present

## 2023-12-20 NOTE — Telephone Encounter (Signed)
  Chief Complaint: SOB Symptoms: fluid on right lower lung, ankles swollen   Disposition: [] ED /[] Urgent Care (no appt availability in office) / [x] Appointment(In office/virtual)/ []  No Name Virtual Care/ [] Home Care/ [] Refused Recommended Disposition /[] [facility]/ []  Follow-up with PCP Additional Notes: [facility] nurse evaluated pt and called office. RN spoke to daughter [name]. Pt has some SOB while walking and swollen ankles. HH  nurse shared with karen she heard "fluid in right lower lobe." Pt denies any difficulty breathing while resting. [name] denies any gasping for air or bluish color on body.  RN offered appt today, but logistically won't work with their schedule.  Pt has appt 4/3 @ 1020. Pt is waiting to hear from Cardiologist office on appt for these conerns. RN gave care advice and [name] verbalized understanding.               Copied from CRM [id number]. Topic: Clinical - Red Word Triage >> Dec 20, 2023 12:02 PM Sim Boast F wrote: Red Word that prompted transfer to Nurse Triage: Patient is having heart failure, fluid on right lower lobe, swellen in legs and shortness of breath. Reason for Disposition  [1] MODERATE longstanding difficulty breathing (e.g., speaks in phrases, SOB even at rest, pulse 100-120) AND [2] SAME as normal  Answer Assessment - Initial Assessment Questions 1. RESPIRATORY STATUS: "Describe your breathing?" (e.g., wheezing, shortness of breath, unable to speak, severe coughing)      SOB when walking  2. ONSET: "When did this breathing problem begin?"      3-4 days ago  3. PATTERN "Does the difficult breathing come and go, or has it been constant since it started?"      Constant while walking  4. SEVERITY: "How bad is your breathing?" (e.g., mild, moderate, severe)    - MILD: No SOB at rest, mild SOB with walking, speaks normally in sentences, can lie down, no retractions, pulse < 100.    - MODERATE: SOB at rest, SOB with minimal exertion and prefers  to sit, cannot lie down flat, speaks in phrases, mild retractions, audible wheezing, pulse 100-120.    - SEVERE: Very SOB at rest, speaks in single words, struggling to breathe, sitting hunched forward, retractions, pulse > 120      Moderate  5. RECURRENT SYMPTOM: "Have you had difficulty breathing before?" If Yes, ask: "When was the last time?" and "What happened that time?"      no 6. CARDIAC HISTORY: "Do you have any history of heart disease?" (e.g., heart attack, angina, bypass surgery, angioplasty)    Triple bypass 7. LUNG HISTORY: "Do you have any history of lung disease?"  (e.g., pulmonary embolus, asthma, emphysema)     denies 8. CAUSE: "What do you think is causing the breathing problem?"      Heart failure  9. OTHER SYMPTOMS: "Do you have any other symptoms? (e.g., dizziness, runny nose, cough, chest pain, fever)     Denies  10. O2 SATURATION MONITOR:  "Do you use an oxygen saturation monitor (pulse oximeter) at home?" If Yes, ask: "What is your reading (oxygen level) today?" "What is your usual oxygen saturation reading?" (e.g., 95%)       Na  Protocols used: Breathing Difficulty-A-AH

## 2023-12-20 NOTE — Telephone Encounter (Signed)
 Pt c/o swelling: STAT is pt has developed SOB within 24 hours  How much weight have you gained and in what time span?  Patient not taking weights. NP with [facility] assumes they may not be checking during visits due to age.   If swelling, where is the swelling located?  Both legs +3 pitting edema   Are you currently taking a fluid pill?  Not currently taking. Has an old bottle of Lasix from 2023. NP recommends restarting.  Are you currently SOB?  SOB ongoing the past few days. NP says she heard crackles in right lobe. She also mentions that BP was a little elevated then came down to 132/54 64  after patient walked around a little.   Do you have a log of your daily weights (if so, list)?  No   Have you gained 3 pounds in a day or 5 pounds in a week?   Have you traveled recently?  Luther Parody, NP encouraged patient to wear stockings and elevate legs throughout the day + prop legs up with pillows at night. NP advises it may be best to contact daughter, [name], with advisement at [phone]. However, questions, her number is [phone].

## 2023-12-20 NOTE — Telephone Encounter (Signed)
 FYI... pt is scheduled to see Dr. Charlann Lange tomorrow.

## 2023-12-20 NOTE — Telephone Encounter (Signed)
 Left a message for the patient to call back.

## 2023-12-21 ENCOUNTER — Ambulatory Visit (INDEPENDENT_AMBULATORY_CARE_PROVIDER_SITE_OTHER)

## 2023-12-21 ENCOUNTER — Other Ambulatory Visit: Payer: Self-pay

## 2023-12-21 ENCOUNTER — Ambulatory Visit

## 2023-12-21 VITALS — BP 118/56 | HR 66 | Temp 97.5°F | Ht 63.0 in | Wt 126.2 lb

## 2023-12-21 DIAGNOSIS — R0600 Dyspnea, unspecified: Secondary | ICD-10-CM

## 2023-12-21 DIAGNOSIS — R0609 Other forms of dyspnea: Secondary | ICD-10-CM

## 2023-12-21 DIAGNOSIS — R918 Other nonspecific abnormal finding of lung field: Secondary | ICD-10-CM | POA: Diagnosis not present

## 2023-12-21 DIAGNOSIS — R350 Frequency of micturition: Secondary | ICD-10-CM | POA: Insufficient documentation

## 2023-12-21 DIAGNOSIS — K449 Diaphragmatic hernia without obstruction or gangrene: Secondary | ICD-10-CM | POA: Diagnosis not present

## 2023-12-21 DIAGNOSIS — J90 Pleural effusion, not elsewhere classified: Secondary | ICD-10-CM | POA: Diagnosis not present

## 2023-12-21 MED ORDER — FUROSEMIDE 20 MG PO TABS
ORAL_TABLET | ORAL | 3 refills | Status: DC
Start: 2023-12-21 — End: 2023-12-29

## 2023-12-21 NOTE — Patient Instructions (Addendum)
-   Take Lasix 20 mg, 1 tablet daily for one week then as needed after that till you see your cardiologist on 12/29/23. Check weight daily. If you have more than 3 pounds weight gain in one day please seek medical care (reach out to our office/cardiology clinic).   - Please keep your appointment with cardiologist.   - If you develop chest pain, worsening shortness of breath please go to the ER.

## 2023-12-21 NOTE — Assessment & Plan Note (Signed)
 Urinalysis ordered. Management pending results. Likely secondary to CHF exacerbation.

## 2023-12-21 NOTE — Assessment & Plan Note (Signed)
-  12-lead EKG shows sinus rhythm with HR 58/min.  - Given wheezing we will obtain chest x-ray to r/o pneumonia vs cardiomegaly which would be more suggestive for CHF exacerbation.  - Given urinary frequency we will get UA to r/o UTI.  - CBC to r/o anemia. TSH to r/o thyroid disorder. CMP to evaluate her electrolytes, renal function.  - Given patient's h/o CHFpEF, CKD stage IV gentle diuresis recommended.  Take Lasix 20 mg daily for next week. Daily weight check. Take Lasix if daily weight gain is over 3 pounds. Keep follow up with cardiology which is scheduled for 12/29/23. At the meantime patient was counseled to go to ED if she were to develop chest pain/heaviness/worsening shortness of breath.

## 2023-12-21 NOTE — Progress Notes (Signed)
 Established Patient Office Visit   Subjective  Patient ID: [name], female    DOB: November 18, 1925  Age: 89 y.o. MRN: [id number]  Chief Complaint  Patient presents with   Shortness of Breath    Patient reports that she has been short of breath for about 2 weeks. Patient does not remember when she last took Lasix and had an appointment with nurse from insurance copy yesterday who discarded of medication because it was expired.      She  has a past medical history of C. difficile colitis, CAD (coronary artery disease), Carotid artery disease (HCC), Closed fracture pubis (HCC) (07/04/2017), COPD (chronic obstructive pulmonary disease) (HCC), Diffuse cystic mastopathy, Diverticulitis, Dizziness, Gait abnormality, GERD (gastroesophageal reflux disease), History of migraines, CABG, Hyperlipidemia, Hypertension, Indigestion, Kidney stones, Rheumatic fever, SCC (squamous cell carcinoma), face (08/01/2016), Sinus bradycardia, SOB (shortness of breath), and Syncopal episodes.  HPI Patient here with her [name], history obtained from both patient and her daughter presenting for following concern:   - Exertional dyspnea:  Shortness of breath with exertion for about 1-2 weeks.  She is able to walk for 5 minutes without needing to catch a break. Patient has a h/o HFpEF, CKD, HTN, hyperlipidemia, CAD, COPD.  Patient reports she has been taking her medications as recommended except for Lasix which she is only supposed to take for lower leg edema. She does not recall the last time she took Lasix. She reports current prescription is expired. She denies orthopnea, PND. B/L lower leg edema has been stable. She denies recent illness, change in diet, cough, wheezing, fever, chills. She also denies need for use of extra pillow at night.   - Urinary frequency x 1 day.  Patient reports since increased urination her breathing is improved.    - Home health nurse visited patient on 12/20/23 and weight was 130 pounds  during the visit. Reports her breathing today is significantly improved.    ROS As per HPI    Objective:     BP (!) 118/56   Pulse 66   Temp (!) 97.5 F (36.4 C) (Oral)   Ht 5\' 3"  (1.6 m)   Wt 126 lb 3.2 oz (57.2 kg)   SpO2 97%   BMI 22.36 kg/m  Wt Readings from Last 3 Encounters:  12/21/23 126 lb 3.2 oz (57.2 kg)  11/27/23 120 lb 3.2 oz (54.5 kg)  05/30/23 121 lb (54.9 kg)    Physical Exam Constitutional:      Appearance: She is well-developed and well-groomed.     Comments: Patient is hard of hearing  HENT:     Head: Normocephalic and atraumatic.     Mouth/Throat:     Mouth: Mucous membranes are moist.  Eyes:     Pupils: Pupils are equal, round, and reactive to light.  Neck:     Thyroid: No thyromegaly.     Vascular: No hepatojugular reflux.  Cardiovascular:     Rate and Rhythm: Normal rate.     Pulses:          Posterior tibial pulses are 2+ on the right side and 2+ on the left side.  Pulmonary:     Effort: Pulmonary effort is normal.     Breath sounds: Wheezing present.     Comments: Bibasilar wheezing bilaterally, no crackles. Abdominal:     General: Bowel sounds are normal. There is no distension.     Palpations: Abdomen is soft.     Tenderness: There is no right CVA  tenderness, left CVA tenderness or guarding.  Musculoskeletal:     Cervical back: Normal range of motion and neck supple.     Right lower leg: Edema (2+ pitting edema) present.     Left lower leg: Edema (2+pitting edema) present.  Neurological:     Mental Status: She is alert and oriented to person, place, and time.  Psychiatric:        Behavior: Behavior is cooperative.    No results found for any visits on 12/21/23.  The ASCVD Risk score (Arnett DK, et al., 2019) failed to calculate for the following reasons:   The 2019 ASCVD risk score is only valid for ages 74 to 61    Assessment & Plan:  Patient is a pleasant 89 year old female presenting with her daughter for evaluation of  dyspnea on exertion. BP reviewed, wnl today. D/D includes CHF exacerbation, COPD exacerbation, arrhythmia, anemia, UTI.    Dyspnea on exertion Assessment & Plan: -12-lead EKG shows sinus rhythm with HR 58/min.  - Given wheezing we will obtain chest x-ray to r/o pneumonia vs cardiomegaly which would be more suggestive for CHF exacerbation.  - Given urinary frequency we will get UA to r/o UTI.  - CBC to r/o anemia. TSH to r/o thyroid disorder. CMP to evaluate her electrolytes, renal function.  - Given patient's h/o CHFpEF, CKD stage IV gentle diuresis recommended.  Take Lasix 20 mg daily for next week. Daily weight check. Take Lasix if daily weight gain is over 3 pounds. Keep follow up with cardiology which is scheduled for 12/29/23. At the meantime patient was counseled to go to ED if she were to develop chest pain/heaviness/worsening shortness of breath.    Orders: -     EKG 12-Lead -     Furosemide; TAKE 1 TABLET BY MOUTH NOT MORE THAN ONCE A WEEK AS NEEDED  Dispense: 12 tablet; Refill: 3 -     Comprehensive metabolic panel with GFR -     CBC with Differential/Platelet -     TSH -     DG Chest 2 View; Future  Dyspnea, unspecified type  Urinary frequency Assessment & Plan: Urinalysis ordered. Management pending results. Likely secondary to CHF exacerbation.    Orders: -     Urinalysis -     CBC with Differential/Platelet    Return if symptoms worsen or fail to improve.   Skip Estimable, MD

## 2023-12-22 LAB — COMPREHENSIVE METABOLIC PANEL WITH GFR
ALT: 14 U/L (ref 0–35)
AST: 17 U/L (ref 0–37)
Albumin: 4 g/dL (ref 3.5–5.2)
Alkaline Phosphatase: 62 U/L (ref 39–117)
BUN: 22 mg/dL (ref 6–23)
CO2: 26 meq/L (ref 19–32)
Calcium: 8.5 mg/dL (ref 8.4–10.5)
Chloride: 104 meq/L (ref 96–112)
Creatinine, Ser: 1.49 mg/dL — ABNORMAL HIGH (ref 0.40–1.20)
GFR: 29.71 mL/min — ABNORMAL LOW (ref >60.00)
Glucose, Bld: 99 mg/dL (ref 70–99)
Potassium: 5 meq/L (ref 3.5–5.1)
Sodium: 141 meq/L (ref 135–145)
Total Bilirubin: 0.6 mg/dL (ref 0.2–1.2)
Total Protein: 6 g/dL (ref 6.0–8.3)

## 2023-12-22 LAB — CBC WITH DIFFERENTIAL/PLATELET
Basophils Absolute: 0.1 10*3/uL (ref 0.0–0.1)
Basophils Relative: 1.1 % (ref 0.0–3.0)
Eosinophils Absolute: 0.2 10*3/uL (ref 0.0–0.7)
Eosinophils Relative: 2.4 % (ref 0.0–5.0)
HCT: 35.6 % — ABNORMAL LOW (ref 36.0–46.0)
Hemoglobin: 11.8 g/dL — ABNORMAL LOW (ref 12.0–15.0)
Lymphocytes Relative: 15.1 % (ref 12.0–46.0)
Lymphs Abs: 1.1 10*3/uL (ref 0.7–4.0)
MCHC: 33.2 g/dL (ref 30.0–36.0)
MCV: 105 fl — ABNORMAL HIGH (ref 78.0–100.0)
Monocytes Absolute: 0.9 10*3/uL (ref 0.1–1.0)
Monocytes Relative: 13.1 % — ABNORMAL HIGH (ref 3.0–12.0)
Neutro Abs: 4.9 10*3/uL (ref 1.4–7.7)
Neutrophils Relative %: 68.3 % (ref 43.0–77.0)
Platelets: 175 10*3/uL (ref 150.0–400.0)
RBC: 3.39 Mil/uL — ABNORMAL LOW (ref 3.87–5.11)
RDW: 13.4 % (ref 11.5–15.5)
WBC: 7.2 10*3/uL (ref 4.0–10.5)

## 2023-12-22 LAB — URINALYSIS, ROUTINE W REFLEX MICROSCOPIC
Bilirubin Urine: NEGATIVE
Hgb urine dipstick: NEGATIVE
Nitrite: NEGATIVE
Specific Gravity, Urine: >=1.03 — AB (ref 1.000–1.030)
Total Protein, Urine: 100 — AB
Urine Glucose: NEGATIVE
Urobilinogen, UA: 0.2 (ref 0.0–1.0)
pH: 6 (ref 5.0–8.0)

## 2023-12-22 LAB — TSH: TSH: 3.75 u[IU]/mL (ref 0.35–5.50)

## 2023-12-25 NOTE — Telephone Encounter (Signed)
 Attempted to contact daughter, unable to reach.  Unable to leave voicemail.

## 2023-12-27 ENCOUNTER — Ambulatory Visit (INDEPENDENT_AMBULATORY_CARE_PROVIDER_SITE_OTHER)

## 2023-12-27 VITALS — BP 126/70 | HR 68 | Temp 98.1°F | Ht 63.0 in | Wt 122.0 lb

## 2023-12-27 DIAGNOSIS — M25551 Pain in right hip: Secondary | ICD-10-CM | POA: Insufficient documentation

## 2023-12-27 MED ORDER — DICLOFENAC SODIUM 1 % EX GEL: 4.0000 g | Freq: Four times a day (QID) | CUTANEOUS | Status: DC

## 2023-12-27 NOTE — Patient Instructions (Addendum)
 1) Apply Voltaren gel, 3-4 times a day as needed on right hip to help with pain. I have send prescription for it.  2) Okay to use heating pad, gentle massage to the hip muscle.  3) I also put a home health referral for physical therapy.

## 2023-12-27 NOTE — Progress Notes (Signed)
   Acute Office Visit  Subjective:    Patient ID: [name], female    DOB: July 04, 1926, 89 y.o.   MRN: [id number]  Chief Complaint  Patient presents with   Hip Pain   Right hip pain:   Patient here with her daughter presenting for evaluation of right hip pain for about 2 weeks. H/O obtained from both patient and her daughter. R hip pain worse with laying on the right side. She denies known trauma to the hip. She lives at home. She does not drive due to dementia. When pain is worse she rates it at 5/10 in intensity which is mostly when laying on the right side. No muscle weakness. She has taken Aspirin, heating pad without significant improvement. Daughter reports ball like lump on palpation of the hip couple days ago. No fever, unintentional weight loss.   Patient was seen by me on 12/21/23 with exertional dyspnea. Since her visit with me she reports that is almost resolved.   Review of Systems  Constitutional:  Negative for chills and fever.  Musculoskeletal:  Positive for joint pain. Negative for falls.   As per HPI    Objective:    BP 126/70   Pulse 68   Temp 98.1 F (36.7 C) (Oral)   Ht 5\' 3"  (1.6 m)   Wt 122 lb (55.3 kg)   SpO2 99%   BMI 21.61 kg/m    Physical Exam Musculoskeletal:     Right hip: Tenderness (On palpation over greater trochanter) present. No lacerations or crepitus. Normal strength.     Left hip: Normal strength.     Right lower leg: Edema (1+ pititng edema) present.     Left lower leg: Edema (1+ pititng edema) present.  Skin:    General: Skin is warm.     Findings: No bruising.  Neurological:     Mental Status: She is alert.    No results found for any visits on 12/27/23.     Assessment & Plan:  Pleasant 89 year old female presenting for evaluation of right hip pain. Palpation over R hip causes some discomfort otherwise no swelling, palpable mass, bruising noted. I suspect this is related to osteoarthritis of hip.   Right hip  pain Assessment & Plan: I recommend physical therapy for strengthening exercises. Patient does not drive and is completely reliant on her daughter to go to medical appointments. Patient will benefit from home health PT. Referral made. At the meantime I recommend patient use 1% Diclofenac gel 3-4 times a day prn to right hip, avoid laying on the right side, gentle massage of right hip, heating pad to help with pain. I recommend x-ray hip in 6 weeks if above measures does not help resolve or improve her symptoms.   Orders: -     Diclofenac Sodium -     Ambulatory referral to Home Health    Return if symptoms worsen or fail to improve.  Skip Estimable, MD

## 2023-12-27 NOTE — Telephone Encounter (Signed)
 Appointment with Dr.Gollan scheduled for 04/11

## 2023-12-27 NOTE — Assessment & Plan Note (Signed)
 I recommend physical therapy for strengthening exercises. Patient does not drive and is completely reliant on her daughter to go to medical appointments. Patient will benefit from home health PT. Referral made. At the meantime I recommend patient use 1% Diclofenac gel 3-4 times a day prn to right hip, avoid laying on the right side, gentle massage of right hip, heating pad to help with pain. I recommend x-ray hip in 6 weeks if above measures does not help resolve or improve her symptoms.

## 2023-12-29 ENCOUNTER — Encounter: Payer: Self-pay | Admitting: Cardiovascular Disease

## 2023-12-29 ENCOUNTER — Ambulatory Visit
Payer: No Typology Code available for payment source | Attending: Cardiovascular Disease | Admitting: Cardiovascular Disease

## 2023-12-29 VITALS — BP 140/80 | HR 60 | Ht 62.0 in | Wt 121.0 lb

## 2023-12-29 DIAGNOSIS — Z951 Presence of aortocoronary bypass graft: Secondary | ICD-10-CM | POA: Diagnosis not present

## 2023-12-29 DIAGNOSIS — I700 Atherosclerosis of aorta: Secondary | ICD-10-CM | POA: Diagnosis not present

## 2023-12-29 DIAGNOSIS — R0609 Other forms of dyspnea: Secondary | ICD-10-CM | POA: Diagnosis not present

## 2023-12-29 DIAGNOSIS — E785 Hyperlipidemia, unspecified: Secondary | ICD-10-CM

## 2023-12-29 DIAGNOSIS — N183 Chronic kidney disease, stage 3 unspecified: Secondary | ICD-10-CM

## 2023-12-29 DIAGNOSIS — Z79899 Other long term (current) drug therapy: Secondary | ICD-10-CM

## 2023-12-29 DIAGNOSIS — I5032 Chronic diastolic (congestive) heart failure: Secondary | ICD-10-CM

## 2023-12-29 DIAGNOSIS — I25118 Atherosclerotic heart disease of native coronary artery with other forms of angina pectoris: Secondary | ICD-10-CM

## 2023-12-29 DIAGNOSIS — I6523 Occlusion and stenosis of bilateral carotid arteries: Secondary | ICD-10-CM | POA: Diagnosis not present

## 2023-12-29 DIAGNOSIS — I10 Essential (primary) hypertension: Secondary | ICD-10-CM | POA: Diagnosis not present

## 2023-12-29 MED ORDER — FUROSEMIDE 20 MG PO TABS
ORAL_TABLET | ORAL | 3 refills | Status: AC
Start: 2023-12-29 — End: ?

## 2023-12-29 NOTE — Patient Instructions (Addendum)
 Medication Instructions:  Lasix 20 mg twice a week(Friday and Tuesday)  If you need a refill on your cardiac medications before your next appointment, please call your pharmacy.   Lab work: Your provider would like for you to return in 1 month to have the following labs drawn: BMP.   Please go to [facility] [address] [address], [place] [address] You do not need an appointment.  They are open from 8 am- 4:30 pm.  Lunch from 1:00 pm- 2:00 pm You will need to be fasting.  Testing/Procedures: Your physician has requested that you have an echocardiogram. Echocardiography is a painless test that uses sound waves to create images of your heart. It provides your doctor with information about the size and shape of your heart and how well your heart's chambers and valves are working.   You may receive an ultrasound enhancing agent through an IV if needed to better visualize your heart during the echo. This procedure takes approximately one hour.  There are no restrictions for this procedure.  This will take place at [address] [facility]) [address], [place] [address]  Please note: We ask at that you not bring children with you during ultrasound (echo/ vascular) testing. Due to room size and safety concerns, children are not allowed in the ultrasound rooms during exams. Our front office staff cannot provide observation of children in our lobby area while testing is being conducted. An adult accompanying a patient to their appointment will only be allowed in the ultrasound room at the discretion of the ultrasound technician under special circumstances. We apologize for any inconvenience.   Follow-Up: At [facility], you and your health needs are our priority.  As part of our continuing mission to provide you with exceptional heart care, we have created designated Provider Care Teams.  These Care Teams include your primary Cardiologist  (physician) and Advanced Practice Providers (APPs -  Physician Assistants and Nurse Practitioners) who all work together to provide you with the care you need, when you need it.  You will need a follow up appointment in 3 months  Providers on your designated Care Team:   Nicolasa Ducking, NP Eula Listen, PA-C Cadence Fransico Michael, [place]  COVID-19 Vaccine Information can be found at: [email or web address] For questions related to vaccine distribution or appointments, please email [email or web address] or call [phone].

## 2023-12-29 NOTE — Progress Notes (Signed)
 Cardiology Office Note  Date:  12/29/2023   ID:  [name], DOB Nov 07, 1925, MRN [id number]  PCP:  Katrina Shams, MD   Chief Complaint  Patient presents with   6 month follow up     Patient c/o bilateral LE edema.     HPI:  [name] is a pleasant 89 year old woman with a history of  CAD, bypass surgery in 1999, COPD, Smoking for 40 years who quit in 1985,  chronic renal insufficiency,  hypertension,  hyperlipidemia,  40-50% bilateral carotid arterial disease  EF >60% in 05/2015  C. difficile in January 2015. H/o fall wet floor  02/2017, Suffered a pelvic fracture, left shoulder pain who presents for routine followup of her coronary artery disease  Last seen by myself in clinic April 2024 Recent symptoms of shortness of breath, seen by primary care started on Lasix 20 mg daily for 1 week Chest x-ray with large hiatal hernia, small right pleural effusion  Now reports taking Lasix 20 mg once a week Still with mild right lower extremity edema, has chronic left lower extremity edema from prior vein surgery Slight shortness of breath on exertion, reports that she walks fast  No recent echocardiogram available Denies PND orthopnea  No recent tachycardia episodes Denies chest pain concerning for angina Reports blood pressure stable at home  Has a walker, uses cane for balance  EKG reviewed from primary care 2 weeks ago  Presented to the emergency room May 14, 2022 with central chest pain, nonradiating Troponin negative x1  Other past medical history reviewed Prior stress test in 2020 and 2019 Prior stress test reviewed with her September 2020, low risk  Previously treated with chemotherapy for cancer on right lower extremity  Hospital admission 12/21/2016 for COPD exacerbation  PMH:   has a past medical history of C. difficile colitis, CAD (coronary artery disease), Carotid artery disease (HCC), Closed fracture pubis (HCC) (07/04/2017), COPD (chronic  obstructive pulmonary disease) (HCC), Diffuse cystic mastopathy, Diverticulitis, Dizziness, Gait abnormality, GERD (gastroesophageal reflux disease), History of migraines, CABG, Hyperlipidemia, Hypertension, Indigestion, Kidney stones, Rheumatic fever, SCC (squamous cell carcinoma), face (08/01/2016), Sinus bradycardia, SOB (shortness of breath), and Syncopal episodes.  PSH:    Past Surgical History:  Procedure Laterality Date   APPENDECTOMY  1950   BREAST BIOPSY Right 1994   BREAST CYST ASPIRATION     multiple BIL   CATARACT EXTRACTION Right 2009   CATARACT EXTRACTION Left 2011   COLONOSCOPY  4098,1191   Dr. Evette Cristal   CORONARY ARTERY BYPASS GRAFT  1999   esophagus stretched     TONSILLECTOMY  1939   TOTAL ABDOMINAL HYSTERECTOMY  1968   due to metrorrhagia    Current Outpatient Medications  Medication Sig Dispense Refill   atorvastatin (LIPITOR) 40 MG tablet TAKE 1 TABLET BY MOUTH DAILY 90 tablet 1   Bacillus Coagulans-Inulin (PROBIOTIC) 1-250 BILLION-MG CAPS Take 1 capsule by mouth daily. 30 capsule 0   clopidogrel (PLAVIX) 75 MG tablet TAKE 1 TABLET BY MOUTH DAILY 90 tablet 0   cyanocobalamin (VITAMIN B12) 1000 MCG/ML injection Inject 1 mL (1,000 mcg total) into the muscle once a week. FOR 4 WEEKS . 4 mL 3   fluticasone (FLONASE) 50 MCG/ACT nasal spray Place 2 sprays into both nostrils daily. 11.1 mL 2   furosemide (LASIX) 20 MG tablet TAKE 1 TABLET BY MOUTH NOT MORE THAN ONCE A WEEK AS NEEDED 12 tablet 3   ipratropium-albuterol (DUONEB) 0.5-2.5 (3) MG/3ML SOLN Take 3 mLs by  nebulization every 6 (six) hours as needed. 360 mL 2   isosorbide mononitrate (IMDUR) 60 MG 24 hr tablet TAKE ONE AND ONE-HALF TABLET BY MOUTH TWICE DAILY 270 tablet 0   losartan (COZAAR) 100 MG tablet TAKE 1 TABLET BY MOUTH DAILY 90 tablet 0   magic mouthwash (lidocaine, diphenhydrAMINE, alum & mag hydroxide) suspension Swish and spit 5 mLs 4 (four) times daily as needed for mouth pain. 360 mL 1   metoprolol  succinate (TOPROL-XL) 25 MG 24 hr tablet Take 1 tablet (25 mg total) by mouth daily. 90 tablet 3   mirtazapine (REMERON) 15 MG tablet TAKE 1 TABLET BY MOUTH NIGHTLY 90 tablet 1   pantoprazole (PROTONIX) 40 MG tablet Take 1 tablet (40 mg total) by mouth daily. 90 tablet 1   Probiotic Product (PROBIOTIC PO) Take 1 capsule by mouth daily.     propranolol (INDERAL) 20 MG tablet TAKE ONE TABLET 3 TIMES DAILY AS NEEDED FOR TACHYCARDIA 270 tablet 3   ranolazine (RANEXA) 500 MG 12 hr tablet TAKE 1 TABLET BY MOUTH TWICE DAILY 180 tablet 0   vitamin B-12 (CYANOCOBALAMIN) 500 MCG tablet Take 500 mcg by mouth daily.     Current Facility-Administered Medications  Medication Dose Route Frequency Provider Last Rate Last Admin   diclofenac Sodium (VOLTAREN) 1 % topical gel 4 g  4 g Topical QID          Allergies:   Amlodipine   Social History:  The patient  reports that she quit smoking about 40 years ago. Her smoking use included cigarettes. She started smoking about 80 years ago. She has a 20 pack-year smoking history. She has never used smokeless tobacco. She reports that she does not currently use alcohol. She reports that she does not use drugs.   Family History:   family history includes Heart disease in her father and mother.    Review of Systems: Review of Systems  Constitutional: Negative.   HENT: Negative.    Respiratory: Negative.    Cardiovascular: Negative.   Gastrointestinal: Negative.   Musculoskeletal: Negative.   Neurological: Negative.   Psychiatric/Behavioral: Negative.    All other systems reviewed and are negative.   PHYSICAL EXAM: VS:  BP (!) 140/80 (BP Location: Left Arm, Patient Position: Sitting, Cuff Size: Normal)   Pulse 60   Ht 5\' 2"  (1.575 m)   Wt 121 lb (54.9 kg)   SpO2 97%   BMI 22.13 kg/m  , BMI Body mass index is 22.13 kg/m. Constitutional:  oriented to person, place, and time. No distress.  HENT:  Head: Grossly normal Eyes:  no discharge. No scleral  icterus.  Neck: No JVD, no carotid bruits  Cardiovascular: Regular rate and rhythm, no murmurs appreciated Trace lower extremity edema right leg, 1+ left leg Pulmonary/Chest: Clear to auscultation bilaterally, no wheezes or rails Abdominal: Soft.  no distension.  no tenderness.  Musculoskeletal: Normal range of motion Neurological:  normal muscle tone. Coordination normal. No atrophy Skin: Skin warm and dry Psychiatric: normal affect, pleasant  Recent Labs: 12/21/2023: ALT 14; BUN 22; Creatinine, Ser 1.49; Hemoglobin 11.8; Platelets 175.0 Repeated and verified X2.; Potassium 5.0; Sodium 141; TSH 3.75   Lipid Panel Lab Results  Component Value Date   CHOL 138 01/06/2022   HDL 59.50 01/06/2022   LDLCALC 60 01/06/2022   TRIG 93.0 01/06/2022    Wt Readings from Last 3 Encounters:  12/29/23 121 lb (54.9 kg)  12/27/23 122 lb (55.3 kg)  12/21/23 126 lb 3.2  oz (57.2 kg)     ASSESSMENT AND PLAN: Essential hypertension -  Blood pressure is well controlled on today's visit. No changes made to the medications.  Coronary artery disease involving native coronary artery of native heart without angina pectoris -  Chronic stable angina Successful medical management for her anginal symptoms No plan for ischemic workup  Shortness of breath/leg edema COPD, chronic angina Likely component of diastolic CHF Recommend she increase Lasix up to 20 mg twice a week and as needed with close monitoring of renal function Repeat echocardiogram ordered, BMP in 1 month  History of bypass surgery Nitro as needed Previously declined catheterization  Anginal symptoms have been well-controlled  Carotid stenosis Less than 39% bilaterally, significant plaque noted Cholesterol at goal  Hyperlipidemia Cholesterol is at goal on the current lipid regimen. No changes to the medications were made.  Leg swelling Trace on the right, 1+ on the left Wears compression hose daily   No orders of the defined  types were placed in this encounter.    Signed, Dossie Arbour, M.D., Ph.D. 12/29/2023  [facility] [place], [place] [phone]

## 2024-01-02 ENCOUNTER — Telehealth: Payer: Self-pay

## 2024-01-02 DIAGNOSIS — I2510 Atherosclerotic heart disease of native coronary artery without angina pectoris: Secondary | ICD-10-CM | POA: Diagnosis not present

## 2024-01-02 DIAGNOSIS — Z9181 History of falling: Secondary | ICD-10-CM | POA: Diagnosis not present

## 2024-01-02 DIAGNOSIS — K219 Gastro-esophageal reflux disease without esophagitis: Secondary | ICD-10-CM | POA: Diagnosis not present

## 2024-01-02 DIAGNOSIS — J449 Chronic obstructive pulmonary disease, unspecified: Secondary | ICD-10-CM | POA: Diagnosis not present

## 2024-01-02 DIAGNOSIS — Z951 Presence of aortocoronary bypass graft: Secondary | ICD-10-CM | POA: Diagnosis not present

## 2024-01-02 DIAGNOSIS — E785 Hyperlipidemia, unspecified: Secondary | ICD-10-CM | POA: Diagnosis not present

## 2024-01-02 DIAGNOSIS — F0390 Unspecified dementia without behavioral disturbance: Secondary | ICD-10-CM | POA: Diagnosis not present

## 2024-01-02 DIAGNOSIS — M1611 Unilateral primary osteoarthritis, right hip: Secondary | ICD-10-CM | POA: Diagnosis not present

## 2024-01-02 DIAGNOSIS — Z87442 Personal history of urinary calculi: Secondary | ICD-10-CM | POA: Diagnosis not present

## 2024-01-02 DIAGNOSIS — I130 Hypertensive heart and chronic kidney disease with heart failure and stage 1 through stage 4 chronic kidney disease, or unspecified chronic kidney disease: Secondary | ICD-10-CM | POA: Diagnosis not present

## 2024-01-02 DIAGNOSIS — Z85828 Personal history of other malignant neoplasm of skin: Secondary | ICD-10-CM | POA: Diagnosis not present

## 2024-01-02 DIAGNOSIS — Z7902 Long term (current) use of antithrombotics/antiplatelets: Secondary | ICD-10-CM | POA: Diagnosis not present

## 2024-01-02 DIAGNOSIS — G43909 Migraine, unspecified, not intractable, without status migrainosus: Secondary | ICD-10-CM | POA: Diagnosis not present

## 2024-01-02 DIAGNOSIS — N184 Chronic kidney disease, stage 4 (severe): Secondary | ICD-10-CM | POA: Diagnosis not present

## 2024-01-02 DIAGNOSIS — I5032 Chronic diastolic (congestive) heart failure: Secondary | ICD-10-CM | POA: Diagnosis not present

## 2024-01-02 NOTE — Telephone Encounter (Signed)
 Copied from CRM [id number]. Topic: Clinical - Home Health Verbal Orders >> Jan 02, 2024  4:20 PM Earnestine Goes B wrote: Caller/Agency: Emogene Harpin Callback Number: [phone] Service Requested: Physical Therapy Frequency:  Any new concerns about the patient? Yes called to advise pt is doing well, does not need phyiscal therapy. Was only see for one time evaluation

## 2024-01-09 ENCOUNTER — Other Ambulatory Visit: Payer: Self-pay | Admitting: Cardiovascular Disease

## 2024-01-09 ENCOUNTER — Encounter: Payer: Self-pay | Admitting: Internal Medicine

## 2024-01-12 DIAGNOSIS — K219 Gastro-esophageal reflux disease without esophagitis: Secondary | ICD-10-CM | POA: Diagnosis not present

## 2024-01-12 DIAGNOSIS — F0390 Unspecified dementia without behavioral disturbance: Secondary | ICD-10-CM | POA: Diagnosis not present

## 2024-01-12 DIAGNOSIS — Z7902 Long term (current) use of antithrombotics/antiplatelets: Secondary | ICD-10-CM | POA: Diagnosis not present

## 2024-01-12 DIAGNOSIS — Z951 Presence of aortocoronary bypass graft: Secondary | ICD-10-CM | POA: Diagnosis not present

## 2024-01-12 DIAGNOSIS — M1611 Unilateral primary osteoarthritis, right hip: Secondary | ICD-10-CM | POA: Diagnosis not present

## 2024-01-12 DIAGNOSIS — I2510 Atherosclerotic heart disease of native coronary artery without angina pectoris: Secondary | ICD-10-CM | POA: Diagnosis not present

## 2024-01-12 DIAGNOSIS — G43909 Migraine, unspecified, not intractable, without status migrainosus: Secondary | ICD-10-CM | POA: Diagnosis not present

## 2024-01-12 DIAGNOSIS — N184 Chronic kidney disease, stage 4 (severe): Secondary | ICD-10-CM | POA: Diagnosis not present

## 2024-01-12 DIAGNOSIS — I5032 Chronic diastolic (congestive) heart failure: Secondary | ICD-10-CM | POA: Diagnosis not present

## 2024-01-12 DIAGNOSIS — J449 Chronic obstructive pulmonary disease, unspecified: Secondary | ICD-10-CM | POA: Diagnosis not present

## 2024-01-12 DIAGNOSIS — E785 Hyperlipidemia, unspecified: Secondary | ICD-10-CM | POA: Diagnosis not present

## 2024-01-12 DIAGNOSIS — I130 Hypertensive heart and chronic kidney disease with heart failure and stage 1 through stage 4 chronic kidney disease, or unspecified chronic kidney disease: Secondary | ICD-10-CM | POA: Diagnosis not present

## 2024-01-15 DIAGNOSIS — H6121 Impacted cerumen, right ear: Secondary | ICD-10-CM | POA: Diagnosis not present

## 2024-01-15 DIAGNOSIS — Z6821 Body mass index (BMI) 21.0-21.9, adult: Secondary | ICD-10-CM | POA: Diagnosis not present

## 2024-01-15 DIAGNOSIS — I10 Essential (primary) hypertension: Secondary | ICD-10-CM | POA: Diagnosis not present

## 2024-01-29 ENCOUNTER — Ambulatory Visit: Attending: Cardiovascular Disease

## 2024-01-29 DIAGNOSIS — R0609 Other forms of dyspnea: Secondary | ICD-10-CM

## 2024-01-29 DIAGNOSIS — Z79899 Other long term (current) drug therapy: Secondary | ICD-10-CM | POA: Diagnosis not present

## 2024-01-29 LAB — ECHOCARDIOGRAM COMPLETE
AR max vel: 2.31 cm2
AV Area VTI: 2.4 cm2
AV Area mean vel: 2.37 cm2
AV Mean grad: 4 mmHg
AV Peak grad: 7.7 mmHg
Ao pk vel: 1.39 m/s
Area-P 1/2: 4.15 cm2
S' Lateral: 3.51 cm

## 2024-01-30 ENCOUNTER — Ambulatory Visit: Payer: Self-pay | Admitting: Cardiovascular Disease

## 2024-01-30 ENCOUNTER — Encounter: Payer: Self-pay | Admitting: Emergency Medicine

## 2024-01-30 LAB — BASIC METABOLIC PANEL WITH GFR
BUN/Creatinine Ratio: 13 (ref 12–28)
BUN: 19 mg/dL (ref 10–36)
CO2: 22 mmol/L (ref 20–29)
Calcium: 8.8 mg/dL (ref 8.7–10.3)
Chloride: 103 mmol/L (ref 96–106)
Creatinine, Ser: 1.43 mg/dL — ABNORMAL HIGH (ref 0.57–1.00)
Glucose: 118 mg/dL — ABNORMAL HIGH (ref 70–99)
Potassium: 4.8 mmol/L (ref 3.5–5.2)
Sodium: 142 mmol/L (ref 134–144)
eGFR: 34 mL/min/{1.73_m2} — ABNORMAL LOW (ref >59)

## 2024-01-31 DIAGNOSIS — H3582 Retinal ischemia: Secondary | ICD-10-CM | POA: Diagnosis not present

## 2024-01-31 DIAGNOSIS — H353131 Nonexudative age-related macular degeneration, bilateral, early dry stage: Secondary | ICD-10-CM | POA: Diagnosis not present

## 2024-01-31 DIAGNOSIS — H353221 Exudative age-related macular degeneration, left eye, with active choroidal neovascularization: Secondary | ICD-10-CM | POA: Diagnosis not present

## 2024-02-07 ENCOUNTER — Other Ambulatory Visit: Payer: Self-pay | Admitting: Cardiovascular Disease

## 2024-02-21 ENCOUNTER — Ambulatory Visit (INDEPENDENT_AMBULATORY_CARE_PROVIDER_SITE_OTHER): Payer: No Typology Code available for payment source | Admitting: *Deleted

## 2024-02-21 VITALS — Ht 63.0 in | Wt 120.0 lb

## 2024-02-21 DIAGNOSIS — Z0000 Encounter for general adult medical examination without abnormal findings: Secondary | ICD-10-CM

## 2024-02-21 NOTE — Progress Notes (Signed)
 Subjective:   [name] is a 89 y.o. who presents for a Medicare Wellness preventive visit.  As a reminder, Annual Wellness Visits don't include a physical exam, and some assessments may be limited, especially if this visit is performed virtually. We may recommend an in-person follow-up visit with your provider if needed.  Visit Complete: Virtual I connected with  [name] on 02/21/24 by a audio enabled telemedicine application and verified that I am speaking with the correct person using two identifiers.  Patient Location: Home  Provider Location: Home Office  I discussed the limitations of evaluation and management by telemedicine. The patient expressed understanding and agreed to proceed.  Vital Signs: Because this visit was a virtual/telehealth visit, some criteria may be missing or patient reported. Any vitals not documented were not able to be obtained and vitals that have been documented are patient reported.  VideoDeclined- This patient declined Librarian, academic. Therefore the visit was completed with audio only.  Persons Participating in Visit: Patient.  AWV Questionnaire: No: Patient Medicare AWV questionnaire was not completed prior to this visit.  Cardiac Risk Factors include: advanced age (>14men, >57 women);dyslipidemia;hypertension;Other (see comment), Risk factor comments: CAD     Objective:     Today's Vitals   02/21/24 1129  Weight: 120 lb (54.4 kg)  Height: 5\' 3"  (1.6 m)   Body mass index is 21.26 kg/m.     02/21/2024   11:44 AM 04/19/2023    4:52 PM 02/20/2023    1:34 PM 10/25/2022    1:16 PM 05/14/2022    2:45 AM 02/10/2022    1:56 PM 11/29/2021   12:03 PM  Advanced Directives  Does Patient Have a Medical Advance Directive? Yes Yes No Yes No Yes Yes  Type of Estate agent of [place];Living will Healthcare Power of [employer] of [place];Living will  Healthcare Power of  [place];Living will Healthcare Power of [place];Living will  Does patient want to make changes to medical advance directive?      No - Patient declined No - Patient declined  Copy of Healthcare Power of Attorney in Chart? No - copy requested     No - copy requested   Would patient like information on creating a medical advance directive?   No - Patient declined        Current Medications (verified) Outpatient Encounter Medications as of 02/21/2024  Medication Sig   atorvastatin  (LIPITOR) 40 MG tablet TAKE 1 TABLET BY MOUTH DAILY   Bacillus Coagulans-Inulin (PROBIOTIC) 1-250 BILLION-MG CAPS Take 1 capsule by mouth daily.   clopidogrel  (PLAVIX ) 75 MG tablet TAKE 1 TABLET BY MOUTH DAILY   cyanocobalamin  (VITAMIN B12) 1000 MCG/ML injection Inject 1 mL (1,000 mcg total) into the muscle once a week. FOR 4 WEEKS .   fluticasone  (FLONASE ) 50 MCG/ACT nasal spray Place 2 sprays into both nostrils daily.   furosemide  (LASIX ) 20 MG tablet Take 1 tablet ( 20 MG) two days a week. May take extra tablet (20 MG) once weekly as needed.   ipratropium-albuterol  (DUONEB) 0.5-2.5 (3) MG/3ML SOLN Take 3 mLs by nebulization every 6 (six) hours as needed.   isosorbide  mononitrate (IMDUR ) 60 MG 24 hr tablet TAKE ONE AND ONE-HALF TABLET BY MOUTH TWICE DAILY   losartan  (COZAAR ) 100 MG tablet TAKE 1 TABLET BY MOUTH DAILY   metoprolol  succinate (TOPROL -XL) 25 MG 24 hr tablet Take 1 tablet (25 mg total) by mouth daily.   mirtazapine  (REMERON ) 15  MG tablet TAKE 1 TABLET BY MOUTH NIGHTLY   pantoprazole  (PROTONIX ) 40 MG tablet Take 1 tablet (40 mg total) by mouth daily.   Probiotic Product (PROBIOTIC PO) Take 1 capsule by mouth daily.   propranolol  (INDERAL ) 20 MG tablet TAKE ONE TABLET 3 TIMES DAILY AS NEEDED FOR TACHYCARDIA   ranolazine  (RANEXA ) 500 MG 12 hr tablet TAKE 1 TABLET BY MOUTH TWICE DAILY   vitamin B-12 (CYANOCOBALAMIN ) 500 MCG tablet Take 500 mcg by mouth daily.   [DISCONTINUED] magic mouthwash (lidocaine ,  diphenhydrAMINE , alum & mag hydroxide) suspension Swish and spit 5 mLs 4 (four) times daily as needed for mouth pain. (Patient not taking: Reported on 02/21/2024)   Facility-Administered Encounter Medications as of 02/21/2024  Medication   diclofenac  Sodium (VOLTAREN ) 1 % topical gel 4 g    Allergies (verified) Amlodipine    History: Past Medical History:  Diagnosis Date   C. difficile colitis    CAD (coronary artery disease)    a. s/p CABG 1999; b. Nuclear 10/11, no scar or ischemia, EF 68%; b. Lexiscan  Myoview  06/18/14: no evidence of infarction or ischemia, EF 77%, low risk study; c. 02/2019 MV: EF 56%, ? mild lat ischemia->low risk.   Carotid artery disease (HCC)    a. Doppler January, 2012, 40-59% bilateral; b. 02/2017 U/S: 1-39% bilat ICA stenosis.   Closed fracture pubis (HCC) 07/04/2017   COPD (chronic obstructive pulmonary disease) (HCC)    Diffuse cystic mastopathy    Diverticulitis    last colonoscopy  incomplete Jan 2011   Dizziness    stable now (Oct 2011) patient tells me question of TIA, we will obtain records   Gait abnormality    Patient complains of "wobbly gait", December, 2013   GERD (gastroesophageal reflux disease)    History of migraines    Hx of CABG    a. 3 vessel in 1999   Hyperlipidemia    Hypertension    Indigestion    3 weeks, October 2011   Kidney stones    Rheumatic fever    SCC (squamous cell carcinoma), face 08/01/2016   Sinus bradycardia    Mild, December, 2013   SOB (shortness of breath)    Syncopal episodes    after 18 holes of golf and increase hydrochlorothiazide , resolved    Past Surgical History:  Procedure Laterality Date   APPENDECTOMY  1950   BREAST BIOPSY Right 1994   BREAST CYST ASPIRATION     multiple BIL   CATARACT EXTRACTION Right 2009   CATARACT EXTRACTION Left 2011   COLONOSCOPY  [id number]   Dr. Lorel Roes   CORONARY ARTERY BYPASS GRAFT  1999   esophagus stretched     TONSILLECTOMY  1939   TOTAL ABDOMINAL HYSTERECTOMY   1968   due to metrorrhagia   Family History  Problem Relation Age of Onset   Heart disease Mother    Heart disease Father    Cancer Neg Hx    Drug abuse Neg Hx    Breast cancer Neg Hx    Social History   Socioeconomic History   Marital status: Widowed    Spouse name: Not on file   Number of children: 2   Years of education: Not on file   Highest education level: Not on file  Occupational History    Employer: RETIRED  Tobacco Use   Smoking status: Former    Current packs/day: 0.00    Average packs/day: 0.5 packs/day for 40.0 years (20.0 ttl pk-yrs)    Types: Cigarettes  Start date: 09/20/1943    Quit date: 09/20/1983    Years since quitting: 40.4   Smokeless tobacco: Never  Vaping Use   Vaping status: Never Used  Substance and Sexual Activity   Alcohol use: Not Currently    Comment: yes, social wine   Drug use: No   Sexual activity: Never  Other Topics Concern   Not on file  Social History Narrative   She is widowed,  Lives independently , ambulates independently at baseline   Social Drivers of Health   Financial Resource Strain: Low Risk  (02/21/2024)   Overall Financial Resource Strain (CARDIA)    Difficulty of Paying Living Expenses: Not hard at all  Food Insecurity: No Food Insecurity (02/21/2024)   Hunger Vital Sign    Worried About Running Out of Food in the Last Year: Never true    Ran Out of Food in the Last Year: Never true  Transportation Needs: No Transportation Needs (02/21/2024)   PRAPARE - Administrator, Civil Service (Medical): No    Lack of Transportation (Non-Medical): No  Physical Activity: Inactive (02/21/2024)   Exercise Vital Sign    Days of Exercise per Week: 0 days    Minutes of Exercise per Session: 0 min  Stress: No Stress Concern Present (02/21/2024)   [employer] of Occupational Health - Occupational Stress Questionnaire    Feeling of Stress : Only a little  Social Connections: Socially Isolated (02/21/2024)   Social  Connection and Isolation Panel [NHANES]    Frequency of Communication with Friends and Family: More than three times a week    Frequency of Social Gatherings with Friends and Family: More than three times a week    Attends Religious Services: Never    Database administrator or Organizations: No    Attends Banker Meetings: Never    Marital Status: Widowed    Tobacco Counseling Counseling given: Not Answered    Clinical Intake:  Pre-visit preparation completed: Yes  Pain : No/denies pain     BMI - recorded: 21.26 Nutritional Status: BMI of 19-24  Normal Nutritional Risks: None Diabetes: No  Lab Results  Component Value Date   HGBA1C 5.8 01/30/2017   HGBA1C 5.8 09/26/2016     How often do you need to have someone help you when you read instructions, pamphlets, or other written materials from your doctor or pharmacy?: 1 - Never  Interpreter Needed?: No  Information entered by :: R. Kamaal Cast LPN   Activities of Daily Living     02/21/2024   11:31 AM  In your present state of health, do you have any difficulty performing the following activities:  Hearing? 1  Comment wears aids  Vision? 0  Comment glasses  Difficulty concentrating or making decisions? 0  Walking or climbing stairs? 1  Dressing or bathing? 0  Doing errands, shopping? 1  Preparing Food and eating ? N  Using the Toilet? N  In the past six months, have you accidently leaked urine? N  Do you have problems with loss of bowel control? N  Managing your Medications? N  Managing your Finances? N  Housekeeping or managing your Housekeeping? N    Patient Care Team: Thersia Flax, MD as PCP - General (Internal Medicine) Devorah Fonder, MD as PCP - Cardiology (Cardiology) Gollan, Timothy J, MD (Cardiology) Jerlean Mood, MD (General Surgery) Jeoffrey Mole, MD (Unknown Physician Specialty)  I have updated your Care Teams any recent Medical  Services you may have received  from other providers in the past year.     Assessment:    This is a routine wellness examination for [name].  Hearing/Vision screen Hearing Screening - Comments:: Wear aids Vision Screening - Comments:: glasses   Goals Addressed             This Visit's Progress    Patient Stated       Wants to continue to walk       Depression Screen     02/21/2024   11:40 AM 12/21/2023    1:16 PM 11/27/2023    4:06 PM 05/30/2023    4:34 PM 04/28/2023    3:54 PM 02/27/2023    3:20 PM 02/20/2023    1:33 PM  PHQ 2/9 Scores  PHQ - 2 Score 0 0 0 0 1 0 0  PHQ- 9 Score 3 0  2 3 0 0    Fall Risk     02/21/2024   11:33 AM 12/27/2023    2:01 PM 12/21/2023    1:16 PM 11/27/2023    4:06 PM 05/30/2023    4:34 PM  Fall Risk   Falls in the past year? 1 1 1  0 0  Number falls in past yr: 0 0 0 0 0  Injury with Fall? 0 0 0 0 0  Risk for fall due to : History of fall(s);Impaired balance/gait History of fall(s) History of fall(s) No Fall Risks No Fall Risks  Follow up Falls evaluation completed;Falls prevention discussed Falls evaluation completed Falls evaluation completed Falls evaluation completed Falls evaluation completed    MEDICARE RISK AT HOME:  Medicare Risk at Home Any stairs in or around the home?: Yes If so, are there any without handrails?: No Home free of loose throw rugs in walkways, pet beds, electrical cords, etc?: Yes Adequate lighting in your home to reduce risk of falls?: Yes Life alert?: Yes Use of a cane, walker or w/c?: Yes Grab bars in the bathroom?: No Shower chair or bench in shower?: No Elevated toilet seat or a handicapped toilet?: No  TIMED UP AND GO:  Was the test performed?  No  Cognitive Function: 6CIT completed    03/25/2020    9:26 AM 11/08/2016   11:34 AM  MMSE - Mini Mental State Exam  Not completed: Unable to complete   Orientation to time  5  Orientation to Place  5  Registration  3  Attention/ Calculation  5  Recall  3  Language- name 2 objects  2   Language- repeat  1  Language- follow 3 step command  3  Language- read & follow direction  1  Write a sentence  1  Copy design  1  Total score  30        02/21/2024   11:45 AM 02/20/2023    1:38 PM 03/25/2019    9:21 AM 03/14/2018    9:56 AM  6CIT Screen  What Year? 0 points 0 points 0 points 0 points  What month? 0 points 0 points 0 points 0 points  What time? 0 points 0 points 0 points 0 points  Count back from 20 0 points 0 points 0 points 0 points  Months in reverse 0 points 0 points 0 points 0 points  Repeat phrase 2 points 0 points 0 points 0 points  Total Score 2 points 0 points 0 points 0 points    Immunizations Immunization History  Administered Date(s) Administered   Fluad  Quad(high Dose 65+) 05/15/2019, 06/11/2020, 05/17/2021   Influenza Split 07/02/2014, 07/08/2015, 05/20/2016   Influenza Whole 06/11/2012   Influenza, High Dose Seasonal PF 05/30/2018, 07/15/2023   Influenza-Unspecified 07/03/2022   Moderna Sars-Covid-2 Vaccination 12/25/2020   PFIZER Comirnaty(Gray Top)Covid-19 Tri-Sucrose Vaccine 07/20/2020   PFIZER(Purple Top)SARS-COV-2 Vaccination 09/23/2019, 10/14/2019, 07/20/2020   Pfizer(Comirnaty)Fall Seasonal Vaccine 12 years and older 08/04/2022   Pneumococcal Conjugate-13 12/24/2013   Pneumococcal Polysaccharide-23 09/16/2011, 11/09/2015   Tdap 02/07/2013   Zoster Recombinant(Shingrix) 05/15/2017, 07/21/2017   Zoster, Live 07/26/2013    Screening Tests Health Maintenance  Topic Date Due   DTaP/Tdap/Td (2 - Td or Tdap) 02/08/2023   COVID-19 Vaccine (7 - 2024-25 season) 05/21/2023   Medicare Annual Wellness (AWV)  02/20/2024   INFLUENZA VACCINE  04/19/2024   Pneumonia Vaccine 8+ Years old  Completed   DEXA SCAN  Completed   Zoster Vaccines- Shingrix  Completed   HPV VACCINES  Aged Out   Meningococcal B Vaccine  Aged Out    Health Maintenance  Health Maintenance Due  Topic Date Due   DTaP/Tdap/Td (2 - Td or Tdap) 02/08/2023   COVID-19  Vaccine (7 - 2024-25 season) 05/21/2023   Medicare Annual Wellness (AWV)  02/20/2024   Health Maintenance Items Addressed: Discussed the need to update tetanus and covid vaccines  Additional Screening:  Vision Screening: Recommended annual ophthalmology exams for early detection of glaucoma and other disorders of the eye. Up to date [facility] Would you like a referral to an eye doctor? No    Dental Screening: Recommended annual dental exams for proper oral hygiene  Community Resource Referral / Chronic Care Management: CRR required this visit?  No   CCM required this visit?  No   Plan:    I have personally reviewed and noted the following in the patient's chart:   Medical and social history Use of alcohol, tobacco or illicit drugs  Current medications and supplements including opioid prescriptions. Patient is not currently taking opioid prescriptions. Functional ability and status Nutritional status Physical activity Advanced directives List of other physicians Hospitalizations, surgeries, and ER visits in previous 12 months Vitals Screenings to include cognitive, depression, and falls Referrals and appointments  In addition, I have reviewed and discussed with patient certain preventive protocols, quality metrics, and best practice recommendations. A written personalized care plan for preventive services as well as general preventive health recommendations were provided to patient.   Felicitas Horse, LPN   09/24/1094   After Visit Summary: (MyChart) Due to this being a telephonic visit, the after visit summary with patients personalized plan was offered to patient via MyChart   Notes: Nothing significant to report at this time.

## 2024-02-21 NOTE — Patient Instructions (Signed)
 [name] , Thank you for taking time out of your busy schedule to complete your Annual Wellness Visit with me. I enjoyed our conversation and look forward to speaking with you again next year. I, as well as your care team,  appreciate your ongoing commitment to your health goals. Please review the following plan we discussed and let me know if I can assist you in the future. Your Game plan/ To Do List    Referrals: If you haven't heard from the office you've been referred to, please reach out to them at the phone provided.  Remember to update your tetanus and covid vaccines. Follow up Visits: Next Medicare AWV with our clinical staff: 02/24/25 @ 3:40   Have you seen your provider in the last 6 months (3 months if uncontrolled diabetes)? Yes Next Office Visit with your provider: 05/29/24  Clinician Recommendations:  Aim for 30 minutes of exercise or brisk walking, 6-8 glasses of water, and 5 servings of fruits and vegetables each day.       This is a list of the screening recommended for you and due dates:  Health Maintenance  Topic Date Due   DTaP/Tdap/Td vaccine (2 - Td or Tdap) 02/08/2023   COVID-19 Vaccine (7 - 2024-25 season) 05/21/2023   Flu Shot  04/19/2024   Medicare Annual Wellness Visit  02/20/2025   Pneumonia Vaccine  Completed   DEXA scan (bone density measurement)  Completed   Zoster (Shingles) Vaccine  Completed   HPV Vaccine  Aged Out   Meningitis B Vaccine  Aged Out    Advanced directives: (Copy Requested) Please bring a copy of your health care power of attorney and living will to the office to be added to your chart at your convenience. You can mail to [facility] [address] [place], [place] [address] or email to [email or web address] Advance Care Planning is important because it:  [x]  Makes sure you receive the medical care that is consistent with your values, goals, and preferences  [x]  It provides guidance to your family and loved ones  and reduces their decisional burden about whether or not they are making the right decisions based on your wishes.

## 2024-03-15 ENCOUNTER — Encounter: Payer: Self-pay | Admitting: Internal Medicine

## 2024-03-27 ENCOUNTER — Other Ambulatory Visit: Payer: Self-pay | Admitting: Internal Medicine

## 2024-03-29 DIAGNOSIS — H353131 Nonexudative age-related macular degeneration, bilateral, early dry stage: Secondary | ICD-10-CM | POA: Diagnosis not present

## 2024-03-29 DIAGNOSIS — H353221 Exudative age-related macular degeneration, left eye, with active choroidal neovascularization: Secondary | ICD-10-CM | POA: Diagnosis not present

## 2024-03-29 DIAGNOSIS — H3582 Retinal ischemia: Secondary | ICD-10-CM | POA: Diagnosis not present

## 2024-03-31 NOTE — Progress Notes (Unsigned)
 Cardiology Office Note  Date:  04/01/2024   ID:  [name], DOB 07-07-1926, MRN [id number]  PCP:  Marylynn Verneita CROME, MD   Chief Complaint  Patient presents with   3 month follow up     Discuss Echo results. Doing well.     HPI:  [name] is a pleasant 89 year old woman with a history of  CAD, bypass surgery in 1999, COPD, Smoking for 40 years who quit in 1985,  chronic renal insufficiency,  hypertension,  hyperlipidemia,  40-50% bilateral carotid arterial disease  EF >60% in 05/2015  C. difficile in January 2015. H/o fall wet floor  02/2017, Suffered a pelvic fracture, left shoulder pain who presents for routine followup of her coronary artery disease  Last seen by myself in clinic April 2025  Echocardiogram 01/29/24 Low normal ejection fraction 50 to 55%  Normal RV size and function  Dilated left and right atria  Mild to moderate tricuspid valve regurgitation  Overall looks good  Minimal change compared to echo July 2022   Currently taking Lasix  20 mg two times a week Minimal swelling on right leg, chronic mild swelling on the left, vein donor leg from CABG  Denies significant abdominal distention or shortness of breath  Has a walker, uses cane for balance  Other past medical history reviewed Prior stress test in 2020 and 2019 Prior stress test reviewed with her September 2020, low risk  Previously treated with chemotherapy for cancer on right lower extremity  Hospital admission 12/21/2016 for COPD exacerbation  PMH:   has a past medical history of C. difficile colitis, CAD (coronary artery disease), Carotid artery disease (HCC), Closed fracture pubis (HCC) (07/04/2017), COPD (chronic obstructive pulmonary disease) (HCC), Diffuse cystic mastopathy, Diverticulitis, Dizziness, Gait abnormality, GERD (gastroesophageal reflux disease), History of migraines, CABG, Hyperlipidemia, Hypertension, Indigestion, Kidney stones, Rheumatic fever, SCC (squamous cell  carcinoma), face (08/01/2016), Sinus bradycardia, SOB (shortness of breath), and Syncopal episodes.  PSH:    Past Surgical History:  Procedure Laterality Date   APPENDECTOMY  1950   BREAST BIOPSY Right 1994   BREAST CYST ASPIRATION     multiple BIL   CATARACT EXTRACTION Right 2009   CATARACT EXTRACTION Left 2011   COLONOSCOPY  [id number]   Dr. Dellie   CORONARY ARTERY BYPASS GRAFT  1999   esophagus stretched     TONSILLECTOMY  1939   TOTAL ABDOMINAL HYSTERECTOMY  1968   due to metrorrhagia    Current Outpatient Medications  Medication Sig Dispense Refill   atorvastatin  (LIPITOR) 40 MG tablet TAKE 1 TABLET BY MOUTH DAILY 90 tablet 0   clopidogrel  (PLAVIX ) 75 MG tablet TAKE 1 TABLET BY MOUTH DAILY 90 tablet 3   fluticasone  (FLONASE ) 50 MCG/ACT nasal spray Place 2 sprays into both nostrils daily. 11.1 mL 2   furosemide  (LASIX ) 20 MG tablet Take 1 tablet ( 20 MG) two days a week. May take extra tablet (20 MG) once weekly as needed. 30 tablet 3   ipratropium-albuterol  (DUONEB) 0.5-2.5 (3) MG/3ML SOLN Take 3 mLs by nebulization every 6 (six) hours as needed. 360 mL 2   isosorbide  mononitrate (IMDUR ) 60 MG 24 hr tablet TAKE ONE AND ONE-HALF TABLET BY MOUTH TWICE DAILY 270 tablet 3   losartan  (COZAAR ) 100 MG tablet TAKE 1 TABLET BY MOUTH DAILY 90 tablet 3   mirtazapine  (REMERON ) 15 MG tablet TAKE 1 TABLET BY MOUTH NIGHTLY 90 tablet 1   pantoprazole  (PROTONIX ) 40 MG tablet Take 1 tablet (40 mg total) by  mouth daily. 90 tablet 1   propranolol  (INDERAL ) 20 MG tablet TAKE ONE TABLET 3 TIMES DAILY AS NEEDED FOR TACHYCARDIA 270 tablet 3   ranolazine  (RANEXA ) 500 MG 12 hr tablet TAKE 1 TABLET BY MOUTH TWICE DAILY 180 tablet 3   vitamin B-12 (CYANOCOBALAMIN ) 500 MCG tablet Take 500 mcg by mouth daily.     cyanocobalamin  (VITAMIN B12) 1000 MCG/ML injection Inject 1 mL (1,000 mcg total) into the muscle once a week. FOR 4 WEEKS . (Patient not taking: Reported on 04/01/2024) 4 mL 3   metoprolol   succinate (TOPROL -XL) 25 MG 24 hr tablet Take 1 tablet (25 mg total) by mouth daily. 90 tablet 3   Current Facility-Administered Medications  Medication Dose Route Frequency Provider Last Rate Last Admin   diclofenac  Sodium (VOLTAREN ) 1 % topical gel 4 g  4 g Topical QID          Allergies:   Amlodipine    Social History:  The patient  reports that she quit smoking about 40 years ago. Her smoking use included cigarettes. She started smoking about 80 years ago. She has a 20 pack-year smoking history. She has never used smokeless tobacco. She reports that she does not currently use alcohol. She reports that she does not use drugs.   Family History:   family history includes Heart disease in her father and mother.    Review of Systems: Review of Systems  Constitutional: Negative.   HENT: Negative.    Respiratory: Negative.    Cardiovascular: Negative.   Gastrointestinal: Negative.   Musculoskeletal: Negative.   Neurological: Negative.   Psychiatric/Behavioral: Negative.    All other systems reviewed and are negative.   PHYSICAL EXAM: VS:  BP (!) 156/52 (BP Location: Left Arm, Patient Position: Sitting, Cuff Size: Normal)   Pulse 75   Ht 5' 3 (1.6 m)   Wt 116 lb (52.6 kg)   SpO2 93%   BMI 20.55 kg/m  , BMI Body mass index is 20.55 kg/m. Constitutional:  oriented to person, place, and time. No distress.  HENT:  Head: Grossly normal Eyes:  no discharge. No scleral icterus.  Neck: No JVD, no carotid bruits  Cardiovascular: Regular rate and rhythm, no murmurs appreciated Pulmonary/Chest: Clear to auscultation bilaterally, no wheezes or rails Abdominal: Soft.  no distension.  no tenderness.  Musculoskeletal: Normal range of motion Neurological:  normal muscle tone. Coordination normal. No atrophy Skin: Skin warm and dry Psychiatric: normal affect, pleasant  Recent Labs: 12/21/2023: ALT 14; Hemoglobin 11.8; Platelets 175.0 Repeated and verified X2.; TSH 3.75 01/29/2024: BUN 19;  Creatinine, Ser 1.43; Potassium 4.8; Sodium 142   Lipid Panel Lab Results  Component Value Date   CHOL 138 01/06/2022   HDL 59.50 01/06/2022   LDLCALC 60 01/06/2022   TRIG 93.0 01/06/2022    Wt Readings from Last 3 Encounters:  04/01/24 116 lb (52.6 kg)  02/21/24 120 lb (54.4 kg)  12/29/23 121 lb (54.9 kg)     ASSESSMENT AND PLAN: Essential hypertension -  Blood pressure is well controlled on today's visit. No changes made to the medications.  Coronary artery disease involving native coronary artery of native heart without angina pectoris -  Chronic stable angina Symptoms stable at this time, no further workup  Shortness of breath/leg edema COPD, chronic angina Symptoms stable with Lasix  twice a week Recommend extra Lasix  as needed for worsening leg swelling or abdominal distention, weight gain BMP and echo essentially unchanged  History of bypass surgery Nitro as needed  Previously declined catheterization  Given age, not a good candidate for cardiac catheterization  Carotid stenosis Less than 39% bilaterally, significant plaque noted Cholesterol at goal  Hyperlipidemia Cholesterol is at goal on the current lipid regimen. No changes to the medications were made.  Leg swelling Trace on the right, 1+ on the left Wears compression hose which helps symptoms   No orders of the defined types were placed in this encounter.    Signed, Velinda Lunger, M.D., Ph.D. 04/01/2024  [facility] [place], [place] [phone]

## 2024-04-01 ENCOUNTER — Encounter: Payer: Self-pay | Admitting: Cardiovascular Disease

## 2024-04-01 ENCOUNTER — Ambulatory Visit: Attending: Cardiovascular Disease | Admitting: Cardiovascular Disease

## 2024-04-01 VITALS — BP 156/52 | HR 75 | Ht 63.0 in | Wt 116.0 lb

## 2024-04-01 DIAGNOSIS — E785 Hyperlipidemia, unspecified: Secondary | ICD-10-CM | POA: Diagnosis not present

## 2024-04-01 DIAGNOSIS — I5032 Chronic diastolic (congestive) heart failure: Secondary | ICD-10-CM

## 2024-04-01 DIAGNOSIS — N183 Chronic kidney disease, stage 3 unspecified: Secondary | ICD-10-CM

## 2024-04-01 DIAGNOSIS — Z951 Presence of aortocoronary bypass graft: Secondary | ICD-10-CM

## 2024-04-01 DIAGNOSIS — I700 Atherosclerosis of aorta: Secondary | ICD-10-CM | POA: Diagnosis not present

## 2024-04-01 DIAGNOSIS — I10 Essential (primary) hypertension: Secondary | ICD-10-CM

## 2024-04-01 DIAGNOSIS — R0609 Other forms of dyspnea: Secondary | ICD-10-CM | POA: Diagnosis not present

## 2024-04-01 DIAGNOSIS — I6523 Occlusion and stenosis of bilateral carotid arteries: Secondary | ICD-10-CM

## 2024-04-01 DIAGNOSIS — I25118 Atherosclerotic heart disease of native coronary artery with other forms of angina pectoris: Secondary | ICD-10-CM

## 2024-04-01 MED ORDER — METOPROLOL SUCCINATE ER 25 MG PO TB24: 25.0000 mg | ORAL_TABLET | Freq: Every day | ORAL | 3 refills | Status: AC

## 2024-04-01 NOTE — Patient Instructions (Addendum)
 Medication Instructions:   Please restart metoprolol  25 daily  If you need a refill on your cardiac medications before your next appointment, please call your pharmacy.   Lab work: No new labs needed  Testing/Procedures: No new testing needed  Follow-Up: At [facility], you and your health needs are our priority.  As part of our continuing mission to provide you with exceptional heart care, we have created designated Provider Care Teams.  These Care Teams include your primary Cardiologist (physician) and Advanced Practice Providers (APPs -  Physician Assistants and Nurse Practitioners) who all work together to provide you with the care you need, when you need it.  You will need a follow up appointment in 6 months, APP ok  Providers on your designated Care Team:   Lonni Meager, NP Bernardino Bring, PA-C Cadence [place], [place]  COVID-19 Vaccine Information can be found at: [email or web address] For questions related to vaccine distribution or appointments, please email [email or web address] or call [phone].

## 2024-05-08 ENCOUNTER — Other Ambulatory Visit: Payer: Self-pay | Admitting: Internal Medicine

## 2024-05-29 ENCOUNTER — Encounter: Payer: Self-pay | Admitting: Internal Medicine

## 2024-05-29 ENCOUNTER — Ambulatory Visit

## 2024-05-29 ENCOUNTER — Ambulatory Visit (INDEPENDENT_AMBULATORY_CARE_PROVIDER_SITE_OTHER): Admitting: Internal Medicine

## 2024-05-29 VITALS — BP 162/58 | HR 77 | Ht 63.0 in | Wt 116.4 lb

## 2024-05-29 DIAGNOSIS — N1832 Chronic kidney disease, stage 3b: Secondary | ICD-10-CM

## 2024-05-29 DIAGNOSIS — F01A4 Vascular dementia, mild, with anxiety: Secondary | ICD-10-CM | POA: Diagnosis not present

## 2024-05-29 DIAGNOSIS — R2241 Localized swelling, mass and lump, right lower limb: Secondary | ICD-10-CM

## 2024-05-29 DIAGNOSIS — I10 Essential (primary) hypertension: Secondary | ICD-10-CM

## 2024-05-29 DIAGNOSIS — M1611 Unilateral primary osteoarthritis, right hip: Secondary | ICD-10-CM | POA: Diagnosis not present

## 2024-05-29 DIAGNOSIS — R4689 Other symptoms and signs involving appearance and behavior: Secondary | ICD-10-CM

## 2024-05-29 MED ORDER — PANTOPRAZOLE SODIUM 40 MG PO TBEC: 40.0000 mg | DELAYED_RELEASE_TABLET | Freq: Every day | ORAL | 1 refills | Status: AC

## 2024-05-29 MED ORDER — ATORVASTATIN CALCIUM 40 MG PO TABS: 40.0000 mg | ORAL_TABLET | Freq: Every day | ORAL | 1 refills | Status: AC

## 2024-05-29 NOTE — Progress Notes (Unsigned)
 Subjective:  Patient ID: [name], female    DOB: Jan 25, 1926  Age: 89 y.o. MRN: [id number]  CC: There were no encounter diagnoses.   HPI [name] presents for  Chief Complaint  Patient presents with   Medical Management of Chronic Issues    6 month follow up    1) patient concerned about Breast asymmetry  2) persistent knot  on left hip   3) Hypertension:       Outpatient Medications Prior to Visit  Medication Sig Dispense Refill   atorvastatin  (LIPITOR) 40 MG tablet TAKE 1 TABLET BY MOUTH DAILY 90 tablet 0   clopidogrel  (PLAVIX ) 75 MG tablet TAKE 1 TABLET BY MOUTH DAILY 90 tablet 3   fluticasone  (FLONASE ) 50 MCG/ACT nasal spray Place 2 sprays into both nostrils daily. 11.1 mL 2   furosemide  (LASIX ) 20 MG tablet Take 1 tablet ( 20 MG) two days a week. May take extra tablet (20 MG) once weekly as needed. 30 tablet 3   ipratropium-albuterol  (DUONEB) 0.5-2.5 (3) MG/3ML SOLN Take 3 mLs by nebulization every 6 (six) hours as needed. 360 mL 2   isosorbide  mononitrate (IMDUR ) 60 MG 24 hr tablet TAKE ONE AND ONE-HALF TABLET BY MOUTH TWICE DAILY 270 tablet 3   losartan  (COZAAR ) 100 MG tablet TAKE 1 TABLET BY MOUTH DAILY 90 tablet 3   metoprolol  succinate (TOPROL -XL) 25 MG 24 hr tablet Take 1 tablet (25 mg total) by mouth daily. 90 tablet 3   mirtazapine  (REMERON ) 15 MG tablet TAKE 1 TABLET BY MOUTH NIGHTLY 90 tablet 1   pantoprazole  (PROTONIX ) 40 MG tablet Take 1 tablet (40 mg total) by mouth daily. 90 tablet 1   propranolol  (INDERAL ) 20 MG tablet TAKE ONE TABLET 3 TIMES DAILY AS NEEDED FOR TACHYCARDIA 270 tablet 3   ranolazine  (RANEXA ) 500 MG 12 hr tablet TAKE 1 TABLET BY MOUTH TWICE DAILY 180 tablet 3   vitamin B-12 (CYANOCOBALAMIN ) 500 MCG tablet Take 500 mcg by mouth daily.     cyanocobalamin  (VITAMIN B12) 1000 MCG/ML injection Inject 1 mL (1,000 mcg total) into the muscle once a week. FOR 4 WEEKS . (Patient not taking: Reported on 04/01/2024) 4 mL 3    Facility-Administered Medications Prior to Visit  Medication Dose Route Frequency Provider Last Rate Last Admin   diclofenac  Sodium (VOLTAREN ) 1 % topical gel 4 g  4 g Topical QID         Review of Systems;  Patient denies headache, fevers, malaise, unintentional weight loss, skin rash, eye pain, sinus congestion and sinus pain, sore throat, dysphagia,  hemoptysis , cough, dyspnea, wheezing, chest pain, palpitations, orthopnea, edema, abdominal pain, nausea, melena, diarrhea, constipation, flank pain, dysuria, hematuria, urinary  Frequency, nocturia, numbness, tingling, seizures,  Focal weakness, Loss of consciousness,  Tremor, insomnia, depression, anxiety, and suicidal ideation.      Objective:  BP (!) 162/58   Pulse 77   Ht 5' 3 (1.6 m)   Wt 116 lb 6.4 oz (52.8 kg)   SpO2 96%   BMI 20.62 kg/m   BP Readings from Last 3 Encounters:  05/29/24 (!) 162/58  04/01/24 (!) 156/52  12/29/23 (!) 140/80    Wt Readings from Last 3 Encounters:  05/29/24 116 lb 6.4 oz (52.8 kg)  04/01/24 116 lb (52.6 kg)  02/21/24 120 lb (54.4 kg)    Physical Exam  Lab Results  Component Value Date   HGBA1C 5.8 01/30/2017   HGBA1C 5.8 09/26/2016    Lab  Results  Component Value Date   CREATININE 1.43 (H) 01/29/2024   CREATININE 1.49 (H) 12/21/2023   CREATININE 1.67 (H) 05/04/2023    Lab Results  Component Value Date   WBC 7.2 12/21/2023   HGB 11.8 (L) 12/21/2023   HCT 35.6 (L) 12/21/2023   PLT 175.0 Repeated and verified X2. 12/21/2023   GLUCOSE 118 (H) 01/29/2024   CHOL 138 01/06/2022   TRIG 93.0 01/06/2022   HDL 59.50 01/06/2022   LDLDIRECT 72.0 11/09/2015   LDLCALC 60 01/06/2022   ALT 14 12/21/2023   AST 17 12/21/2023   NA 142 01/29/2024   K 4.8 01/29/2024   CL 103 01/29/2024   CREATININE 1.43 (H) 01/29/2024   BUN 19 01/29/2024   CO2 22 01/29/2024   TSH 3.75 12/21/2023   INR 0.9 01/31/2018   HGBA1C 5.8 01/30/2017    DG Chest Portable 1 View Result Date:  04/19/2023 CLINICAL DATA:  Dizziness EXAM: PORTABLE CHEST 1 VIEW COMPARISON:  X-ray 05/14/2022 and older FINDINGS: Hyperinflation. Apical pleural thickening. Postop chest. Presumed hiatal hernia. No consolidation, pneumothorax or effusion. No edema. Normal cardiopericardial silhouette. Overlapping cardiac leads. IMPRESSION: Hyperinflation.  Chronic changes.  Hiatal hernia.  Postop chest Electronically Signed   By: Ranell Bring M.D.   On: 04/19/2023 17:43    Assessment & Plan:  .There are no diagnoses linked to this encounter.   I spent 34 minutes on the day of this face to face encounter reviewing patient's  most recent visit with cardiology,  nephrology,  and neurology,  prior relevant surgical and non surgical procedures, recent  labs and imaging studies, counseling on weight management,  reviewing the assessment and plan with patient, and post visit ordering and reviewing of  diagnostics and therapeutics with patient  .   Follow-up: No follow-ups on file.   Verneita LITTIE Kettering, MD

## 2024-05-29 NOTE — Patient Instructions (Signed)
 Your breast appears to be fine  I have ordered CT scan of right femur to investigate the lump in your leg   YOUR BLOOD PRESSURE IS VERY HIGH TODAY  It should be around 130/80 or less.  Please check your blood pressure a few times at home and send me the readings so I can determine if you need to increase your MEDICATION.

## 2024-05-30 DIAGNOSIS — R4689 Other symptoms and signs involving appearance and behavior: Secondary | ICD-10-CM | POA: Insufficient documentation

## 2024-05-30 DIAGNOSIS — R2241 Localized swelling, mass and lump, right lower limb: Secondary | ICD-10-CM | POA: Insufficient documentation

## 2024-05-30 LAB — BASIC METABOLIC PANEL WITH GFR
BUN: 27 mg/dL — ABNORMAL HIGH (ref 6–23)
CO2: 27 meq/L (ref 19–32)
Calcium: 8.8 mg/dL (ref 8.4–10.5)
Chloride: 101 meq/L (ref 96–112)
Creatinine, Ser: 1.68 mg/dL — ABNORMAL HIGH (ref 0.40–1.20)
GFR: 25.64 mL/min — ABNORMAL LOW
Glucose, Bld: 103 mg/dL — ABNORMAL HIGH (ref 70–99)
Potassium: 5.2 meq/L — ABNORMAL HIGH (ref 3.5–5.1)
Sodium: 139 meq/L (ref 135–145)

## 2024-05-30 NOTE — Assessment & Plan Note (Signed)
 Breast exam today is normal for age; reassurance given.  She declines diagnostic mammogram

## 2024-05-30 NOTE — Assessment & Plan Note (Addendum)
 Her anxiety is manageable with reassurance. .  She continues to live alone with supervision from her daughter [name] who is with her today and manages her medications

## 2024-05-30 NOTE — Assessment & Plan Note (Signed)
 There has been  no change in GFR  ;  in the past    Nephrology referral declined.  She is avoiding NSAIDs  Lab Results  Component Value Date   CREATININE 1.43 (H) 01/29/2024

## 2024-05-30 NOTE — Assessment & Plan Note (Addendum)
 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  Plain films today have not been read officially but do not appear to show any disruption of the cortical bone. .  CT femur ordered.

## 2024-05-31 ENCOUNTER — Ambulatory Visit: Payer: Self-pay | Admitting: Internal Medicine

## 2024-05-31 ENCOUNTER — Other Ambulatory Visit: Payer: Self-pay | Admitting: Cardiovascular Disease

## 2024-05-31 DIAGNOSIS — M7989 Other specified soft tissue disorders: Secondary | ICD-10-CM

## 2024-05-31 DIAGNOSIS — R2241 Localized swelling, mass and lump, right lower limb: Secondary | ICD-10-CM

## 2024-05-31 NOTE — Telephone Encounter (Signed)
 Pt of Dr. Gollan. Requesting a refill of Nitroglycerin . It looks like this was discontinued. Is this something that Dr. Gollan would like to refill? Please advise.

## 2024-05-31 NOTE — Addendum Note (Signed)
 Addended by: MARYLYNN VERNEITA CROME on: 05/31/2024 12:37 PM   Modules accepted: Orders

## 2024-06-07 ENCOUNTER — Other Ambulatory Visit

## 2024-06-07 ENCOUNTER — Ambulatory Visit
Admission: RE | Admit: 2024-06-07 | Discharge: 2024-06-07 | Disposition: A | Discharge status: Home / Self Care | Source: Ambulatory Visit | Attending: Internal Medicine | Admitting: Internal Medicine

## 2024-06-07 ENCOUNTER — Encounter: Payer: Self-pay | Admitting: Cardiovascular Disease

## 2024-06-07 DIAGNOSIS — I739 Peripheral vascular disease, unspecified: Secondary | ICD-10-CM | POA: Diagnosis not present

## 2024-06-07 DIAGNOSIS — M1611 Unilateral primary osteoarthritis, right hip: Secondary | ICD-10-CM | POA: Diagnosis not present

## 2024-06-07 DIAGNOSIS — M8580 Other specified disorders of bone density and structure, unspecified site: Secondary | ICD-10-CM | POA: Diagnosis not present

## 2024-06-07 DIAGNOSIS — R2241 Localized swelling, mass and lump, right lower limb: Secondary | ICD-10-CM | POA: Diagnosis not present

## 2024-06-10 ENCOUNTER — Other Ambulatory Visit (INDEPENDENT_AMBULATORY_CARE_PROVIDER_SITE_OTHER)

## 2024-06-10 ENCOUNTER — Other Ambulatory Visit

## 2024-06-10 DIAGNOSIS — N1832 Chronic kidney disease, stage 3b: Secondary | ICD-10-CM

## 2024-06-10 LAB — BASIC METABOLIC PANEL WITH GFR
BUN: 26 mg/dL — ABNORMAL HIGH (ref 6–23)
CO2: 30 meq/L (ref 19–32)
Calcium: 9 mg/dL (ref 8.4–10.5)
Chloride: 102 meq/L (ref 96–112)
Creatinine, Ser: 1.78 mg/dL — ABNORMAL HIGH (ref 0.40–1.20)
GFR: 23.92 mL/min — ABNORMAL LOW (ref >60.00)
Glucose, Bld: 111 mg/dL — ABNORMAL HIGH (ref 70–99)
Potassium: 4.6 meq/L (ref 3.5–5.1)
Sodium: 140 meq/L (ref 135–145)

## 2024-07-05 DIAGNOSIS — M7989 Other specified soft tissue disorders: Secondary | ICD-10-CM | POA: Diagnosis not present

## 2024-07-05 DIAGNOSIS — H02132 Senile ectropion of right lower eyelid: Secondary | ICD-10-CM | POA: Diagnosis not present

## 2024-07-05 DIAGNOSIS — H353221 Exudative age-related macular degeneration, left eye, with active choroidal neovascularization: Secondary | ICD-10-CM | POA: Diagnosis not present

## 2024-07-05 DIAGNOSIS — H353131 Nonexudative age-related macular degeneration, bilateral, early dry stage: Secondary | ICD-10-CM | POA: Diagnosis not present

## 2024-07-05 DIAGNOSIS — M3501 Sicca syndrome with keratoconjunctivitis: Secondary | ICD-10-CM | POA: Diagnosis not present

## 2024-07-08 ENCOUNTER — Ambulatory Visit (INDEPENDENT_AMBULATORY_CARE_PROVIDER_SITE_OTHER): Admitting: Family Medicine

## 2024-07-08 ENCOUNTER — Ambulatory Visit
Admission: RE | Admit: 2024-07-08 | Discharge: 2024-07-08 | Disposition: A | Source: Ambulatory Visit | Attending: Family Medicine | Admitting: Family Medicine

## 2024-07-08 ENCOUNTER — Ambulatory Visit
Admission: RE | Admit: 2024-07-08 | Discharge: 2024-07-08 | Disposition: A | Discharge status: Home / Self Care | Attending: Family Medicine | Admitting: Family Medicine

## 2024-07-08 ENCOUNTER — Encounter: Payer: Self-pay | Admitting: Family Medicine

## 2024-07-08 ENCOUNTER — Ambulatory Visit: Payer: Self-pay

## 2024-07-08 VITALS — BP 169/66 | HR 75 | Resp 16 | Wt 121.4 lb

## 2024-07-08 DIAGNOSIS — I25118 Atherosclerotic heart disease of native coronary artery with other forms of angina pectoris: Secondary | ICD-10-CM

## 2024-07-08 DIAGNOSIS — R0609 Other forms of dyspnea: Secondary | ICD-10-CM | POA: Diagnosis not present

## 2024-07-08 DIAGNOSIS — J90 Pleural effusion, not elsewhere classified: Secondary | ICD-10-CM | POA: Diagnosis not present

## 2024-07-08 DIAGNOSIS — J449 Chronic obstructive pulmonary disease, unspecified: Secondary | ICD-10-CM | POA: Diagnosis not present

## 2024-07-08 DIAGNOSIS — E538 Deficiency of other specified B group vitamins: Secondary | ICD-10-CM

## 2024-07-08 DIAGNOSIS — I517 Cardiomegaly: Secondary | ICD-10-CM | POA: Diagnosis not present

## 2024-07-08 DIAGNOSIS — D649 Anemia, unspecified: Secondary | ICD-10-CM

## 2024-07-08 DIAGNOSIS — N184 Chronic kidney disease, stage 4 (severe): Secondary | ICD-10-CM | POA: Diagnosis not present

## 2024-07-08 DIAGNOSIS — R918 Other nonspecific abnormal finding of lung field: Secondary | ICD-10-CM | POA: Diagnosis not present

## 2024-07-08 MED ORDER — COMBIVENT RESPIMAT 20-100 MCG/ACT IN AERS: 1.0000 | INHALATION_SPRAY | Freq: Four times a day (QID) | RESPIRATORY_TRACT | 0 refills | Status: DC | PRN

## 2024-07-08 NOTE — Patient Instructions (Signed)
 [name]  Please review the attached list of medications and notify my office if there are any errors.   . Please bring all of your medications to every appointment so we can make sure that our medication list is the same as yours.

## 2024-07-08 NOTE — Telephone Encounter (Signed)
 FYI Only or Action Required?: FYI only for provider.  Patient was last seen in primary care on 05/29/2024 by Marylynn Verneita CROME, MD.  Called Nurse Triage reporting Shortness of Breath.  Symptoms began several weeks ago.  Interventions attempted: Nothing.  Symptoms are: gradually worsening.  Triage Disposition: See HCP Within 4 Hours (Or PCP Triage)  Patient/caregiver understands and will follow disposition?: Yes  Copied from CRM #[id number]. Topic: Clinical - Red Word Triage >> Jul 08, 2024 12:42 PM [name] wrote: Red Word that prompted transfer to Nurse Triage: SOB, Requesting an appt. Patient daughter on the line Reason for Disposition  [1] Longstanding difficulty breathing (e.g., CHF, COPD, emphysema) AND [2] WORSE than normal  Answer Assessment - Initial Assessment Questions Spoke with pt and her daughter [name] (on [place]). Pt reports taking combivent . Pt noted to have rx for duoneb, denies taking as she doesn't not have a nebulizer machine. Advised f/u with HCP to see if she can have one ordered through insurance. Appt scheduled for today and advised UC or ED for worsening symptoms.  1. RESPIRATORY STATUS: Describe your breathing? (e.g., wheezing, shortness of breath, unable to speak, severe coughing)      Increased SOB. No coughing or wheezing.   2. ONSET: When did this breathing problem begin?      Couple months ago, worsened over past few weeks.  3. PATTERN Does the difficult breathing come and go, or has it been constant since it started?      With exertion  4. SEVERITY: How bad is your breathing? (e.g., mild, moderate, severe)      Severe SOB with exertion. None at rest. Pts daughter reports her moms breathing does not appear overly labored or noisy when she is up moving around .  5. RECURRENT SYMPTOM: Have you had difficulty breathing before? If Yes, ask: When was the last time? and What happened that time?      Started a few months ago, worse in  recent weeks.  6. CARDIAC HISTORY: Do you have any history of heart disease? (e.g., heart attack, angina, bypass surgery, angioplasty)      Triple bypass 26 years ago.  7. LUNG HISTORY: Do you have any history of lung disease?  (e.g., pulmonary embolus, asthma, emphysema)     COPD  8. CAUSE: What do you think is causing the breathing problem?      Old age  89. OTHER SYMPTOMS: Do you have any other symptoms? (e.g., chest pain, cough, dizziness, fever, runny nose)     No  10. O2 SATURATION MONITOR:  Do you use an oxygen saturation monitor (pulse oximeter) at home? If Yes, ask: What is your reading (oxygen level) today? What is your usual oxygen saturation reading? (e.g., 95%)       No  12. TRAVEL: Have you traveled out of the country in the last month? (e.g., travel history, exposures)       No  Protocols used: Breathing Difficulty-A-AH

## 2024-07-08 NOTE — Progress Notes (Signed)
 Established patient visit   Patient: [name]   DOB: [birth year]   89 y.o. Female  MRN: [id number] Visit Date: 07/08/2024  Today's healthcare provider: Nancyann Perry, MD   Chief Complaint  Patient presents with   Shortness of Breath    For several weeks-worsening   Subjective    Discussed the use of AI scribe software for clinical note transcription with the patient, who gave verbal consent to proceed.  History of Present Illness   [name] is a 89 year old female with COPD and remote h/o CABG who presents with shortness of breath.  She has been experiencing shortness of breath for the past couple of weeks, which she describes as noticeable but not severe. This symptom has become a concern as she is planning a trip to the [place] with her family over Thanksgiving. No coughing, wheezing, chest pain, or heart flutters. She has not experienced any fevers, chills, or sweats.  Her history of COPD was previously managed with Combivent , although she has not been using it recently. She has a nebulizer solution available but has not needed to use it. She smoked in the past, which may have contributed to her COPD.  She experiences swelling in her feet, which has been consistent over time. She manages this with Lasix , taking it twice a week or more frequently if needed. She took a dose this morning.  Her past medical history includes a triple bypass surgery 26 years ago, and she sees a cardiologist for heart-related follow-up. She had a chest x-ray in April due to suspected fluid in the lungs, which was identified by a home health nurse. She had an echocardiogram in May.     Lab Results  Component Value Date   NA 140 06/10/2024   K 4.6 06/10/2024   CREATININE 1.78 (H) 06/10/2024   GFR 23.92 (L) 06/10/2024   GLUCOSE 111 (H) 06/10/2024   Lab Results  Component Value Date   WBC 7.2 12/21/2023   HGB 11.8 (L) 12/21/2023   HCT 35.6 (L) 12/21/2023   MCV 105.0 (H)  12/21/2023   PLT 175.0 Repeated and verified X2. 12/21/2023     Medications: Outpatient Medications Prior to Visit  Medication Sig   atorvastatin  (LIPITOR) 40 MG tablet Take 1 tablet (40 mg total) by mouth daily.   clopidogrel  (PLAVIX ) 75 MG tablet TAKE 1 TABLET BY MOUTH DAILY   fluticasone  (FLONASE ) 50 MCG/ACT nasal spray Place 2 sprays into both nostrils daily.   furosemide  (LASIX ) 20 MG tablet Take 1 tablet ( 20 MG) two days a week. May take extra tablet (20 MG) once weekly as needed.   ipratropium-albuterol  (DUONEB) 0.5-2.5 (3) MG/3ML SOLN Take 3 mLs by nebulization every 6 (six) hours as needed.   isosorbide  mononitrate (IMDUR ) 60 MG 24 hr tablet TAKE ONE AND ONE-HALF TABLET BY MOUTH TWICE DAILY   losartan  (COZAAR ) 100 MG tablet TAKE 1 TABLET BY MOUTH DAILY   metoprolol  succinate (TOPROL -XL) 25 MG 24 hr tablet Take 1 tablet (25 mg total) by mouth daily.   mirtazapine  (REMERON ) 15 MG tablet TAKE 1 TABLET BY MOUTH NIGHTLY   nitroGLYCERIN  (NITROSTAT ) 0.4 MG SL tablet PLACE 1 TABLET UNDER TONGUE EVERY 5 MINUTES AS NEEDED FOR CHEST PAIN. IF NO RELIEF IN 15 MINUTES CALL 911 ( MAXIMUM 3 TABLETS).   pantoprazole  (PROTONIX ) 40 MG tablet Take 1 tablet (40 mg total) by mouth daily.   propranolol  (INDERAL ) 20 MG tablet TAKE ONE TABLET 3 TIMES  DAILY AS NEEDED FOR TACHYCARDIA   ranolazine  (RANEXA ) 500 MG 12 hr tablet TAKE 1 TABLET BY MOUTH TWICE DAILY   vitamin B-12 (CYANOCOBALAMIN ) 500 MCG tablet Take 500 mcg by mouth daily.   Facility-Administered Medications Prior to Visit  Medication Dose Route Frequency Provider   diclofenac  Sodium (VOLTAREN ) 1 % topical gel 4 g  4 g Topical QID    Review of Systems  Constitutional:  Negative for appetite change, chills, fatigue and fever.  HENT:  Negative for rhinorrhea, sinus pressure and sinus pain.   Respiratory:  Positive for shortness of breath. Negative for cough, chest tightness, wheezing and stridor.   Cardiovascular:  Negative for chest pain and  palpitations.  Gastrointestinal:  Negative for abdominal pain, nausea and vomiting.  Neurological:  Negative for dizziness and weakness.       Objective    BP (!) 169/66 (BP Location: Left Arm, Patient Position: Sitting, Cuff Size: Normal)   Pulse 75   Resp 16   Wt 121 lb 6.4 oz (55.1 kg)   SpO2 98%   BMI 21.51 kg/m   Physical Exam   General: Appearance:    Well developed, well nourished female in no acute distress  Eyes:    PERRL, conjunctiva/corneas clear, EOM's intact       Lungs:     Good air movement, no rales, occasionally scattered inspiratory and expiratory wheezes, respirations unlabored  Heart:    Normal heart rate. Normal rhythm. No murmurs, rubs, or gallops.    MS:   All extremities are intact.  2+ bilateral foot, ankle and lower leg edema.   Neurologic:   Awake, alert, oriented x 3. No apparent focal neurological defect.         Assessment & Plan    1. Dyspnea on exertion (Primary) Multifactorial with multiple comorbid conditions as below.   - CBC - Comprehensive metabolic panel with GFR - Brain natriuretic peptide  Previously noted to have small pleural effusion on x-ray in April at which time she was put on course of daily furosemide . Currently taking furosemide  twice a week.  - DG Chest 2 View; Future  2. Coronary artery disease of native artery of native heart with stable angina pectoris Followed by Dr. Gollan. No chest pain or dizziness. Checking BNP today  3. Anemia, unspecified type Checking CBC today.   4. COPD, moderate (HCC) Not currently using her inhalers or nebulizer. Advised to start using her Combivent  inhaler twice a day on a schedule for the time being.    5. B12 deficiency Check B12 levels.   6. CKD (chronic kidney disease) stage 4, GFR 15-29 ml/min (HCC) Checking Met C today.       Nancyann Perry, MD  [facility] [phone] (phone) [phone] (fax)  [facility]

## 2024-07-10 ENCOUNTER — Ambulatory Visit: Payer: Self-pay | Admitting: Family Medicine

## 2024-07-10 ENCOUNTER — Telehealth: Payer: Self-pay

## 2024-07-10 LAB — COMPREHENSIVE METABOLIC PANEL WITH GFR

## 2024-07-10 NOTE — Progress Notes (Signed)
 Spoke with shawn from [facility] test order has been added. Test # [id number].

## 2024-07-10 NOTE — Telephone Encounter (Signed)
 Copied from CRM [id number]. Topic: Appointments - Scheduling Inquiry for Clinic >> Jul 10, 2024  2:13 PM Drema MATSU wrote: Reason for CRM: Darice stated that pt is needing an appointment for a follow up from last visit with Nancyann. Darice stated that after visit states to follow up in a week with pcp. PCP next appointment is 12/08. Please call Darice to schedule.  *She is not available next week  I spoke with Darice, patient's daughter, and scheduled an appointment for patient to see Dr. Verneita Kettering on 07/15/2024.

## 2024-07-11 NOTE — Telephone Encounter (Signed)
 Noted. Called pt to clarify

## 2024-07-15 ENCOUNTER — Encounter: Payer: Self-pay | Admitting: Internal Medicine

## 2024-07-15 ENCOUNTER — Ambulatory Visit (INDEPENDENT_AMBULATORY_CARE_PROVIDER_SITE_OTHER): Admitting: Internal Medicine

## 2024-07-15 VITALS — BP 148/70 | HR 55 | Ht 63.0 in | Wt 120.6 lb

## 2024-07-15 DIAGNOSIS — R0609 Other forms of dyspnea: Secondary | ICD-10-CM | POA: Diagnosis not present

## 2024-07-15 DIAGNOSIS — R0600 Dyspnea, unspecified: Secondary | ICD-10-CM

## 2024-07-15 DIAGNOSIS — H353221 Exudative age-related macular degeneration, left eye, with active choroidal neovascularization: Secondary | ICD-10-CM | POA: Insufficient documentation

## 2024-07-15 DIAGNOSIS — I5032 Chronic diastolic (congestive) heart failure: Secondary | ICD-10-CM | POA: Diagnosis not present

## 2024-07-15 DIAGNOSIS — I4891 Unspecified atrial fibrillation: Secondary | ICD-10-CM | POA: Diagnosis not present

## 2024-07-15 DIAGNOSIS — Z23 Encounter for immunization: Secondary | ICD-10-CM

## 2024-07-15 DIAGNOSIS — R2241 Localized swelling, mass and lump, right lower limb: Secondary | ICD-10-CM | POA: Diagnosis not present

## 2024-07-15 DIAGNOSIS — N184 Chronic kidney disease, stage 4 (severe): Secondary | ICD-10-CM | POA: Diagnosis not present

## 2024-07-15 NOTE — Patient Instructions (Addendum)
 The chest x ray and labs from last week suggest that mild heart failure is the cause of your shortness of breath  Please take 20 mg of lasix  every  morning until you lose 2 lbs  by your home scales STARTING TOMORROW  IF YOU WEIGH 120 LBS TOMORROW MORNING AFTER YOU EMPTY YOUR BLADDER,  THEN YOU WILL TAKE LASIX  UNTIL YOU WEIGH 118 LBS.      Call the office when you have reached 118 lbs,  or send me a mychart message

## 2024-07-15 NOTE — Assessment & Plan Note (Signed)
 multifactorial with CAD and COPD contributing. She is becoming more sedentary to remain asymptomatic and is now reporting dypneic with minimal exertion.  PT referral made but deferred

## 2024-07-15 NOTE — Assessment & Plan Note (Signed)
 Baseline cr needed today as patient has been taking a daily ose of furosemide  since Oct 20

## 2024-07-15 NOTE — Progress Notes (Unsigned)
 Subjective:  Patient ID: [name], female    DOB: [birth year]  Age: 89 y.o. MRN: [id number]  CC: The primary encounter diagnosis was Dyspnea on exertion. Diagnoses of Dyspnea, unspecified type, Exudative age-related macular degeneration, left eye, with active choroidal neovascularization (HCC), Atrial fibrillation, unspecified type (HCC), CKD (chronic kidney disease) stage 4, GFR 15-29 ml/min (HCC), Diastolic CHF, chronic (HCC), and Hip mass, right were also pertinent to this visit.   HPI [name] presents for  Chief Complaint  Patient presents with   Medical Management of Chronic Issues    Seen by Nancyann Perry last week for progressive dyspnea occurring during nighttime trips to the bathroom .  No orthopnea .  Chest x ray showed no acute changes and BNP was 550.  [name]  B12 level >1000.  SHE WAS ADVISED TO  take lasix  5 days  No medication changes were made    IMPRESSION: 1. 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 2. Small, chronic loculated right pleural effusion, unchanged. 3. Pulmonary hyperinflation and probable emphysema.   Outpatient Medications Prior to Visit  Medication Sig Dispense Refill   atorvastatin  (LIPITOR) 40 MG tablet Take 1 tablet (40 mg total) by mouth daily. 90 tablet 1   clopidogrel  (PLAVIX ) 75 MG tablet TAKE 1 TABLET BY MOUTH DAILY 90 tablet 3   fluticasone  (FLONASE ) 50 MCG/ACT nasal spray Place 2 sprays into both nostrils daily. 11.1 mL 2   furosemide  (LASIX ) 20 MG tablet Take 1 tablet ( 20 MG) two days a week. May take extra tablet (20 MG) once weekly as needed. 30 tablet 3   Ipratropium-Albuterol  (COMBIVENT  RESPIMAT) 20-100 MCG/ACT AERS respimat Inhale 1 puff into the lungs every 6 (six) hours as needed for wheezing. 4 g 0   ipratropium-albuterol  (DUONEB) 0.5-2.5 (3) MG/3ML SOLN Take 3 mLs by nebulization every 6 (six) hours as needed. 360 mL 2   isosorbide  mononitrate (IMDUR ) 60 MG 24 hr tablet TAKE ONE AND ONE-HALF TABLET BY  MOUTH TWICE DAILY 270 tablet 3   losartan  (COZAAR ) 100 MG tablet TAKE 1 TABLET BY MOUTH DAILY 90 tablet 3   metoprolol  succinate (TOPROL -XL) 25 MG 24 hr tablet Take 1 tablet (25 mg total) by mouth daily. 90 tablet 3   mirtazapine  (REMERON ) 15 MG tablet TAKE 1 TABLET BY MOUTH NIGHTLY 90 tablet 1   nitroGLYCERIN  (NITROSTAT ) 0.4 MG SL tablet PLACE 1 TABLET UNDER TONGUE EVERY 5 MINUTES AS NEEDED FOR CHEST PAIN. IF NO RELIEF IN 15 MINUTES CALL 911 ( MAXIMUM 3 TABLETS). 25 tablet 3   pantoprazole  (PROTONIX ) 40 MG tablet Take 1 tablet (40 mg total) by mouth daily. 90 tablet 1   propranolol  (INDERAL ) 20 MG tablet TAKE ONE TABLET 3 TIMES DAILY AS NEEDED FOR TACHYCARDIA 270 tablet 3   ranolazine  (RANEXA ) 500 MG 12 hr tablet TAKE 1 TABLET BY MOUTH TWICE DAILY 180 tablet 3   vitamin B-12 (CYANOCOBALAMIN ) 500 MCG tablet Take 500 mcg by mouth daily.     Facility-Administered Medications Prior to Visit  Medication Dose Route Frequency Provider Last Rate Last Admin   diclofenac  Sodium (VOLTAREN ) 1 % topical gel 4 g  4 g Topical QID         Review of Systems;  Patient denies headache, fevers, malaise, unintentional weight loss, skin rash, eye pain, sinus congestion and sinus pain, sore throat, dysphagia,  hemoptysis , cough, dyspnea, wheezing, chest pain, palpitations, orthopnea, edema, abdominal pain, nausea, melena, diarrhea, constipation, flank pain, dysuria, hematuria, urinary  Frequency,  nocturia, numbness, tingling, seizures,  Focal weakness, Loss of consciousness,  Tremor, insomnia, depression, anxiety, and suicidal ideation.      Objective:  BP (!) 148/70   Pulse (!) 55   Ht 5' 3 (1.6 m)   Wt 120 lb 9.6 oz (54.7 kg)   SpO2 94%   BMI 21.36 kg/m   BP Readings from Last 3 Encounters:  07/15/24 (!) 148/70  07/08/24 (!) 169/66  05/29/24 (!) 162/58    Wt Readings from Last 3 Encounters:  07/15/24 120 lb 9.6 oz (54.7 kg)  07/08/24 121 lb 6.4 oz (55.1 kg)  05/29/24 116 lb 6.4 oz (52.8 kg)     Physical Exam Vitals reviewed.  Constitutional:      General: She is not in acute distress.    Appearance: Normal appearance. She is normal weight. She is not ill-appearing, toxic-appearing or diaphoretic.  HENT:     Head: Normocephalic.  Eyes:     General: No scleral icterus.       Right eye: No discharge.        Left eye: No discharge.     Conjunctiva/sclera: Conjunctivae normal.  Cardiovascular:     Rate and Rhythm: Normal rate and regular rhythm.     Heart sounds: Normal heart sounds.  Pulmonary:     Effort: Pulmonary effort is normal. No respiratory distress.     Breath sounds: Rales present.  Musculoskeletal:        General: Normal range of motion.  Skin:    General: Skin is warm and dry.  Neurological:     General: No focal deficit present.     Mental Status: She is alert and oriented to person, place, and time. Mental status is at baseline.  Psychiatric:        Mood and Affect: Mood normal.        Behavior: Behavior normal.        Thought Content: Thought content normal.        Judgment: Judgment normal.     Lab Results  Component Value Date   HGBA1C 5.8 01/30/2017   HGBA1C 5.8 09/26/2016    Lab Results  Component Value Date   CREATININE WILL FOLLOW 07/08/2024   CREATININE 1.78 (H) 06/10/2024   CREATININE 1.68 (H) 05/29/2024    Lab Results  Component Value Date   WBC 7.8 07/08/2024   HGB 11.7 07/08/2024   HCT 36.0 07/08/2024   PLT 169 07/08/2024   GLUCOSE WILL FOLLOW 07/08/2024   CHOL 138 01/06/2022   TRIG 93.0 01/06/2022   HDL 59.50 01/06/2022   LDLDIRECT 72.0 11/09/2015   LDLCALC 60 01/06/2022   ALT WILL FOLLOW 07/08/2024   AST WILL FOLLOW 07/08/2024   NA WILL FOLLOW 07/08/2024   K WILL FOLLOW 07/08/2024   CL WILL FOLLOW 07/08/2024   CREATININE WILL FOLLOW 07/08/2024   BUN WILL FOLLOW 07/08/2024   CO2 WILL FOLLOW 07/08/2024   TSH 3.75 12/21/2023   INR 0.9 01/31/2018   HGBA1C 5.8 01/30/2017    DG Chest 2 View Result Date:  07/09/2024 CLINICAL DATA:  Dyspnea on exertion EXAM: CHEST - 2 VIEW COMPARISON:  12/21/2023 FINDINGS: 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 Small, chronic loculated right pleural effusion, unchanged. Pulmonary hyperinflation and probable emphysema. The visualized skeletal structures are unremarkable. IMPRESSION: 1. 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 2. Small, chronic loculated right pleural effusion, unchanged. 3. Pulmonary hyperinflation and probable emphysema. Electronically Signed   By: Marolyn JONETTA Jaksch M.D.   On:  07/09/2024 18:52    Assessment & Plan:  .Dyspnea on exertion Assessment & Plan:  multifactorial with CAD and COPD contributing. She is becoming more sedentary to remain asymptomatic and is now reporting dypneic with minimal exertion.  PT referral made but deferred  Orders: -     Basic metabolic panel with GFR  Dyspnea, unspecified type -     Brain natriuretic peptide  Exudative age-related macular degeneration, left eye, with active choroidal neovascularization (HCC)  Atrial fibrillation, unspecified type (HCC)  CKD (chronic kidney disease) stage 4, GFR 15-29 ml/min (HCC) Assessment & Plan: Baseline cr needed today as patient has been taking a daily ose of furosemide  since Oct 20    Diastolic CHF, chronic (HCC) Assessment & Plan: BNP >500 last week in setting of dyspnea.  Repeating today after 5 days of diuresis  unclear if she has lost weight ,  but per Darice her daughter,  her ankles appear less edematous.  She has chronic   rales and dullness on exam .   Hip mass, right Assessment & Plan: Soft tissue,  no concerns per maligancy per X ray . CT .orthopedics consult.       I spent 34 minutes on the day of this face to face encounter reviewing patient's  most recent visit with cardiology,  nephrology,  and neurology,  prior relevant surgical and non surgical procedures, recent  labs and imaging studies, counseling on weight management,   reviewing the assessment and plan with patient, and post visit ordering and reviewing of  diagnostics and therapeutics with patient  .   Follow-up: No follow-ups on file.   Verneita LITTIE Kettering, MD

## 2024-07-15 NOTE — Assessment & Plan Note (Signed)
 Soft tissue,  no concerns per maligancy per X ray . CT .orthopedics consult.

## 2024-07-15 NOTE — Assessment & Plan Note (Signed)
 BNP >500 last week in setting of dyspnea.  Repeating today after 5 days of diuresis  unclear if she has lost weight ,  but per Darice her daughter,  her ankles appear less edematous.  She has chronic   rales and dullness on exam .

## 2024-07-16 LAB — BASIC METABOLIC PANEL WITH GFR
BUN: 22 mg/dL (ref 6–23)
CO2: 29 meq/L (ref 19–32)
Calcium: 8.3 mg/dL — ABNORMAL LOW (ref 8.4–10.5)
Chloride: 102 meq/L (ref 96–112)
Creatinine, Ser: 1.35 mg/dL — ABNORMAL HIGH (ref 0.40–1.20)
GFR: 33.31 mL/min — ABNORMAL LOW (ref >60.00)
Glucose, Bld: 95 mg/dL (ref 70–99)
Potassium: 4.7 meq/L (ref 3.5–5.1)
Sodium: 141 meq/L (ref 135–145)

## 2024-07-18 ENCOUNTER — Telehealth: Payer: Self-pay | Admitting: Cardiovascular Disease

## 2024-07-18 LAB — BRAIN NATRIURETIC PEPTIDE: BNP: 454.5 pg/mL — ABNORMAL HIGH (ref 0.0–100.0)

## 2024-07-18 MED ORDER — PROPRANOLOL HCL 20 MG PO TABS: ORAL_TABLET | ORAL | 2 refills | Status: DC

## 2024-07-18 NOTE — Telephone Encounter (Signed)
 Pt's medication was sent to pt's pharmacy as requested. Confirmation received.

## 2024-07-18 NOTE — Telephone Encounter (Signed)
*  STAT* If patient is at the pharmacy, call can be transferred to refill team.   1. Which medications need to be refilled? (please list name of each medication and dose if known) propranolol  (INDERAL ) 20 MG tablet   2. Which pharmacy/location (including street and city if local pharmacy) is medication to be sent to? [facility] - [place], [place] - [address] Phone: [phone]  Fax: [phone]      3. Do they need a 30 day or 90 day supply? 30 Pt is going out of town in the morning and needs them asap

## 2024-07-20 ENCOUNTER — Ambulatory Visit: Payer: Self-pay | Admitting: Internal Medicine

## 2024-07-20 DIAGNOSIS — H6123 Impacted cerumen, bilateral: Secondary | ICD-10-CM | POA: Diagnosis not present

## 2024-07-23 LAB — CBC
Hematocrit: 36 % (ref 34.0–46.6)
Hemoglobin: 11.7 g/dL (ref 11.1–15.9)
MCH: 34.6 pg — ABNORMAL HIGH (ref 26.6–33.0)
MCHC: 32.5 g/dL (ref 31.5–35.7)
MCV: 107 fL — ABNORMAL HIGH (ref 79–97)
Platelets: 169 x10E3/uL (ref 150–450)
RBC: 3.38 x10E6/uL — ABNORMAL LOW (ref 3.77–5.28)
RDW: 10.6 % — ABNORMAL LOW (ref 11.7–15.4)
WBC: 7.8 x10E3/uL (ref 3.4–10.8)

## 2024-07-23 LAB — COMPREHENSIVE METABOLIC PANEL WITH GFR

## 2024-07-23 LAB — BRAIN NATRIURETIC PEPTIDE: BNP: 553.4 pg/mL — ABNORMAL HIGH (ref 0.0–100.0)

## 2024-07-24 ENCOUNTER — Telehealth: Payer: Self-pay

## 2024-07-24 NOTE — Telephone Encounter (Signed)
 Pt daughter called in to follow up on previous msg. States she hasn't heard from anybody yet and just needs confirmation on what she is supposed to do.

## 2024-07-24 NOTE — Telephone Encounter (Signed)
 Copied from CRM [id number]. Topic: Clinical - Medical Advice >> Jul 24, 2024  8:04 AM Berneda FALCON wrote: Reason for CRM: Pt's daughter, [name], Calling Harlene to speak about the Lasix  for the patient. I read the notes back to her verbatim but she had some additional questions. Daughter states that pt is hard of hearing and did not quite understand what was being told to her by Harlene and daughter wants to fully understand when to reduce and resume the lasix . Also was not sure if 118 was the baseline weight or what that weight would be.   Additionally, she wanted to know if the patient gains a little, should she resume daily or what does this look like as far as fluctuation in weight.  Please call daughter, [name], back with additional instruction and clarification.  [phone]

## 2024-07-25 NOTE — Telephone Encounter (Signed)
 See my chart message

## 2024-07-26 ENCOUNTER — Encounter: Payer: Self-pay | Admitting: Internal Medicine

## 2024-07-29 ENCOUNTER — Encounter: Payer: Self-pay | Admitting: Cardiovascular Disease

## 2024-07-29 MED ORDER — BUDESONIDE 0.5 MG/2ML IN SUSP: 0.5000 mg | Freq: Two times a day (BID) | RESPIRATORY_TRACT | 5 refills | Status: DC

## 2024-08-01 LAB — VITAMIN B12: Vitamin B-12: >2000 pg/mL — ABNORMAL HIGH (ref 232–1245)

## 2024-08-01 LAB — SPECIMEN STATUS REPORT

## 2024-08-09 ENCOUNTER — Other Ambulatory Visit: Payer: Self-pay | Admitting: Family Medicine

## 2024-08-09 ENCOUNTER — Other Ambulatory Visit: Payer: Self-pay | Admitting: Internal Medicine

## 2024-08-09 DIAGNOSIS — R0609 Other forms of dyspnea: Secondary | ICD-10-CM

## 2024-08-09 MED ORDER — COMBIVENT RESPIMAT 20-100 MCG/ACT IN AERS: 1.0000 | INHALATION_SPRAY | Freq: Four times a day (QID) | RESPIRATORY_TRACT | 0 refills | Status: DC | PRN

## 2024-08-09 NOTE — Telephone Encounter (Signed)
 Copied from CRM [id number]. Topic: Clinical - Prescription Issue >> Aug 09, 2024 10:28 AM Robinson H wrote: Reason for CRM: Mitzi and patient calling regarding a refill for patients COMBIVENT  RESPIMAT 20-100 MCG/ACT AERS respimat, refill in system processing by Dr. Gasper who originally wrote prescription but Mitzi states she was told by Dr. Cleola office that Dr. Marylynn would have to fill prescription since patient transferred care. Patient is leaving out of the of the country on Tuesday and needs medication.  Mitzi Caregiver [phone] Patient is hard of hearing

## 2024-08-23 ENCOUNTER — Other Ambulatory Visit: Payer: Self-pay | Admitting: Internal Medicine

## 2024-08-23 ENCOUNTER — Ambulatory Visit: Payer: Self-pay

## 2024-08-23 DIAGNOSIS — J441 Chronic obstructive pulmonary disease with (acute) exacerbation: Secondary | ICD-10-CM

## 2024-08-23 MED ORDER — PREDNISONE 10 MG PO TABS: ORAL_TABLET | ORAL | 0 refills | Status: DC

## 2024-08-23 MED ORDER — AZITHROMYCIN 500 MG PO TABS: 500.0000 mg | ORAL_TABLET | Freq: Every day | ORAL | 0 refills | Status: DC

## 2024-08-23 NOTE — Telephone Encounter (Signed)
 Pt's daughter is aware of the medications that were sent in and the if the oxygen level drops anymore than she will need to take her to the ED.

## 2024-08-23 NOTE — Telephone Encounter (Signed)
 Daughter Darice [phone]) called in to triage to report cough, and nasal congestion in the patient. Wheezing/crackles noted. Oxygen level is 91% after nebulizer treatment. These symptoms have been ongoing x 5 days. No chest pain noted, but abdominal pain due the constant cough. For OTC she is taking Delsyn and Mucinex. The daughter is requesting PCP call in medications for the symptoms, as she does not wish to take patient out in the weather/elements. She did state if provider wont' prescribe medication she will take her to UC due to lack of appointment availability in office today. Please advise, she is seeking a call back. This RN reiterated if symptoms worsen to take patient to be evaluated, and they agree with plan of care but only if PCP will not write for medications.

## 2024-08-23 NOTE — Telephone Encounter (Signed)
 FYI Only or Action Required?: Action required by provider: clinical question for provider and update on patient condition.  Patient was last seen in primary care on 07/15/2024 by Marylynn Verneita CROME, MD.  Called Nurse Triage reporting Cough.  Symptoms began several days ago.  Interventions attempted: OTC medications: Mucinex, Delsyn.  Symptoms are: gradually worsening.  Triage Disposition: See HCP Within 4 Hours (Or PCP Triage)  Patient/caregiver understands and will follow disposition?: Yes            Copied from CRM #[id number]. Topic: Clinical - Red Word Triage >> Aug 23, 2024 10:53 AM Shereese L wrote: [facility] that prompted transfer to Nurse Triage: Congestion, cough, oxygen low, colored mucus Reason for Disposition  [1] MILD difficulty breathing (e.g., minimal/no SOB at rest, SOB with walking, pulse < 100) AND [2] still present when not coughing  Wheezing is present  Answer Assessment - Initial Assessment Questions 1. ONSET: When did the cough begin?      X 5 days    2. SEVERITY: How bad is the cough today?      Constant cough    3. SPUTUM: Describe the color of your sputum (e.g., none, dry cough; clear, white, yellow, green)     Yellow    4. HEMOPTYSIS: Are you coughing up any blood? If Yes, ask: How much? (e.g., flecks, streaks, tablespoons, etc.)     No    5. DIFFICULTY BREATHING: Are you having difficulty breathing? If Yes, ask: How bad is it? (e.g., mild, moderate, severe)      SOB- mild    6. FEVER: Do you have a fever? If Yes, ask: What is your temperature, how was it measured, and when did it start?      No   7. CARDIAC HISTORY: Do you have any history of heart disease? 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 LUNG HISTORY: Do you have any history of lung disease?  (e.g., pulmonary embolus, asthma, emphysema)     COPD   9. PE RISK FACTORS: Do you have a history of blood clots?  (or: recent major surgery, recent prolonged travel, bedridden)     No    10. OTHER SYMPTOMS: Do you have any other symptoms? (e.g., runny nose, wheezing, chest pain)       Wheezing, crackles    12. TRAVEL: Have you traveled out of the country in the last month? (e.g., travel history, exposures)       Yes, got back in town last Monday   Daughter Darice [phone]) called in to triage to report cough, and nasal congestion in the patient. Wheezing/crackles noted. Oxygen level is 91% after nebulizer treatment. These symptoms have been ongoing x 5 days. No chest pain noted, but abdominal pain due the constant cough. For OTC she is taking Delsyn and Mucinex. The daughter is requesting PCP call in medications for the symptoms, as she does not wish to take patient out in the weather/elements. She did state if provider wont' prescribe medication she will take her to UC due to lack of appointment availability in office today. Please advise, she is seeking a call back. This RN reiterated if symptoms worsen to take patient to be evaluated, and they agree with plan of care but only if PCP will not write for medications.  Protocols used: Cough - Acute Productive-A-AH

## 2024-08-25 ENCOUNTER — Emergency Department

## 2024-08-25 ENCOUNTER — Observation Stay

## 2024-08-25 ENCOUNTER — Other Ambulatory Visit: Payer: Self-pay

## 2024-08-25 ENCOUNTER — Observation Stay
Admission: EM | Admit: 2024-08-25 | Discharge: 2024-09-02 | DRG: 193 | Disposition: A | Attending: Hospitalist | Admitting: Hospitalist

## 2024-08-25 DIAGNOSIS — I10 Essential (primary) hypertension: Secondary | ICD-10-CM | POA: Diagnosis present

## 2024-08-25 DIAGNOSIS — J441 Chronic obstructive pulmonary disease with (acute) exacerbation: Secondary | ICD-10-CM | POA: Diagnosis present

## 2024-08-25 DIAGNOSIS — U071 COVID-19: Principal | ICD-10-CM

## 2024-08-25 DIAGNOSIS — I878 Other specified disorders of veins: Secondary | ICD-10-CM

## 2024-08-25 DIAGNOSIS — J9601 Acute respiratory failure with hypoxia: Secondary | ICD-10-CM | POA: Diagnosis not present

## 2024-08-25 DIAGNOSIS — F419 Anxiety disorder, unspecified: Secondary | ICD-10-CM | POA: Diagnosis present

## 2024-08-25 DIAGNOSIS — J449 Chronic obstructive pulmonary disease, unspecified: Secondary | ICD-10-CM | POA: Diagnosis present

## 2024-08-25 DIAGNOSIS — J1282 Pneumonia due to coronavirus disease 2019: Secondary | ICD-10-CM | POA: Diagnosis not present

## 2024-08-25 DIAGNOSIS — N1832 Chronic kidney disease, stage 3b: Secondary | ICD-10-CM | POA: Diagnosis present

## 2024-08-25 DIAGNOSIS — R0609 Other forms of dyspnea: Secondary | ICD-10-CM | POA: Diagnosis not present

## 2024-08-25 DIAGNOSIS — I2510 Atherosclerotic heart disease of native coronary artery without angina pectoris: Secondary | ICD-10-CM | POA: Diagnosis present

## 2024-08-25 DIAGNOSIS — J189 Pneumonia, unspecified organism: Secondary | ICD-10-CM | POA: Diagnosis present

## 2024-08-25 LAB — CBC
HCT: 38.3 % (ref 36.0–46.0)
Hemoglobin: 12.1 g/dL (ref 12.0–15.0)
MCH: 33.8 pg (ref 26.0–34.0)
MCHC: 31.6 g/dL (ref 30.0–36.0)
MCV: 107 fL — ABNORMAL HIGH (ref 80.0–100.0)
Platelets: 202 K/uL (ref 150–400)
RBC: 3.58 MIL/uL — ABNORMAL LOW (ref 3.87–5.11)
RDW: 12 % (ref 11.5–15.5)
WBC: 7.5 K/uL (ref 4.0–10.5)
nRBC: 0 % (ref 0.0–0.2)

## 2024-08-25 LAB — RESP PANEL BY RT-PCR (RSV, FLU A&B, COVID)  RVPGX2
Influenza A by PCR: NEGATIVE
Influenza B by PCR: NEGATIVE
Resp Syncytial Virus by PCR: NEGATIVE
SARS Coronavirus 2 by RT PCR: POSITIVE — AB

## 2024-08-25 LAB — BASIC METABOLIC PANEL WITH GFR
Anion gap: 12 (ref 5–15)
BUN: 42 mg/dL — ABNORMAL HIGH (ref 8–23)
CO2: 27 mmol/L (ref 22–32)
Calcium: 9 mg/dL (ref 8.9–10.3)
Chloride: 102 mmol/L (ref 98–111)
Creatinine, Ser: 1.58 mg/dL — ABNORMAL HIGH (ref 0.44–1.00)
GFR, Estimated: 30 mL/min — ABNORMAL LOW (ref >60)
Glucose, Bld: 135 mg/dL — ABNORMAL HIGH (ref 70–99)
Potassium: 4.6 mmol/L (ref 3.5–5.1)
Sodium: 141 mmol/L (ref 135–145)

## 2024-08-25 LAB — D-DIMER, QUANTITATIVE: D-Dimer, Quant: 1.12 ug{FEU}/mL — ABNORMAL HIGH (ref 0.00–0.50)

## 2024-08-25 MED ORDER — SODIUM CHLORIDE 0.9 % IV SOLN: INTRAVENOUS | Status: AC

## 2024-08-25 MED ORDER — ACETAMINOPHEN 325 MG PO TABS
650.0000 mg | ORAL_TABLET | Freq: Four times a day (QID) | ORAL | Status: DC | PRN
  Administered 2024-08-26: 650 mg via ORAL
  Filled 2024-08-25: qty 2

## 2024-08-25 MED ORDER — ENOXAPARIN SODIUM 30 MG/0.3ML IJ SOSY
30.0000 mg | PREFILLED_SYRINGE | INTRAMUSCULAR | Status: DC
  Administered 2024-08-26 – 2024-09-01 (×7): 30 mg via SUBCUTANEOUS
  Filled 2024-08-25 (×7): qty 0.3

## 2024-08-25 MED ORDER — PANTOPRAZOLE SODIUM 40 MG PO TBEC
40.0000 mg | DELAYED_RELEASE_TABLET | Freq: Every day | ORAL | Status: DC
  Administered 2024-08-25 – 2024-09-01 (×8): 40 mg via ORAL
  Filled 2024-08-25 (×8): qty 1

## 2024-08-25 MED ORDER — RANOLAZINE ER 500 MG PO TB12
500.0000 mg | ORAL_TABLET | Freq: Two times a day (BID) | ORAL | Status: DC
  Administered 2024-08-25 – 2024-09-02 (×16): 500 mg via ORAL
  Filled 2024-08-25 (×17): qty 1

## 2024-08-25 MED ORDER — ALBUTEROL SULFATE (2.5 MG/3ML) 0.083% IN NEBU
5.0000 mg | INHALATION_SOLUTION | Freq: Once | RESPIRATORY_TRACT | Status: AC
  Administered 2024-08-25: 5 mg via RESPIRATORY_TRACT
  Filled 2024-08-25: qty 6

## 2024-08-25 MED ORDER — ACETAMINOPHEN 650 MG RE SUPP: 650.0000 mg | Freq: Four times a day (QID) | RECTAL | Status: AC | PRN

## 2024-08-25 MED ORDER — ONDANSETRON HCL 4 MG/2ML IJ SOLN: 4.0000 mg | Freq: Four times a day (QID) | INTRAMUSCULAR | Status: DC | PRN

## 2024-08-25 MED ORDER — ATORVASTATIN CALCIUM 20 MG PO TABS
40.0000 mg | ORAL_TABLET | Freq: Every day | ORAL | Status: DC
  Administered 2024-08-25 – 2024-09-01 (×8): 40 mg via ORAL
  Filled 2024-08-25 (×8): qty 2

## 2024-08-25 MED ORDER — NITROGLYCERIN 0.4 MG SL SUBL: 0.4000 mg | SUBLINGUAL_TABLET | SUBLINGUAL | Status: DC | PRN

## 2024-08-25 MED ORDER — IPRATROPIUM-ALBUTEROL 0.5-2.5 (3) MG/3ML IN SOLN: 3.0000 mL | Freq: Four times a day (QID) | RESPIRATORY_TRACT | Status: DC | PRN

## 2024-08-25 MED ORDER — MIRTAZAPINE 15 MG PO TABS
15.0000 mg | ORAL_TABLET | Freq: Every day | ORAL | Status: DC
  Administered 2024-08-25 – 2024-09-01 (×8): 15 mg via ORAL
  Filled 2024-08-25 (×8): qty 1

## 2024-08-25 MED ORDER — METOPROLOL SUCCINATE ER 50 MG PO TB24
25.0000 mg | ORAL_TABLET | Freq: Every day | ORAL | Status: DC
  Administered 2024-08-25 – 2024-09-01 (×8): 25 mg via ORAL
  Filled 2024-08-25 (×8): qty 1

## 2024-08-25 MED ORDER — ONDANSETRON HCL 4 MG PO TABS: 4.0000 mg | ORAL_TABLET | Freq: Four times a day (QID) | ORAL | Status: DC | PRN

## 2024-08-25 MED ORDER — BUDESONIDE 0.5 MG/2ML IN SUSP
0.5000 mg | Freq: Two times a day (BID) | RESPIRATORY_TRACT | Status: DC
  Administered 2024-08-25 – 2024-08-29 (×6): 0.5 mg via RESPIRATORY_TRACT
  Filled 2024-08-25 (×7): qty 2

## 2024-08-25 MED ORDER — SODIUM CHLORIDE 0.9 % IV BOLUS
500.0000 mL | Freq: Once | INTRAVENOUS | Status: AC
  Administered 2024-08-25: 500 mL via INTRAVENOUS

## 2024-08-25 MED ORDER — DEXAMETHASONE 6 MG PO TABS
6.0000 mg | ORAL_TABLET | Freq: Every day | ORAL | Status: DC
  Administered 2024-08-25: 6 mg via ORAL
  Filled 2024-08-25: qty 1

## 2024-08-25 MED ORDER — ENOXAPARIN SODIUM 60 MG/0.6ML IJ SOSY
1.0000 mg/kg | PREFILLED_SYRINGE | Freq: Once | INTRAMUSCULAR | Status: AC
  Administered 2024-08-25: 60 mg via SUBCUTANEOUS
  Filled 2024-08-25: qty 0.6

## 2024-08-25 MED ORDER — ISOSORBIDE MONONITRATE ER 60 MG PO TB24
30.0000 mg | ORAL_TABLET | Freq: Two times a day (BID) | ORAL | Status: DC
  Administered 2024-08-25 – 2024-09-02 (×16): 30 mg via ORAL
  Filled 2024-08-25 (×16): qty 1

## 2024-08-25 MED ORDER — SODIUM CHLORIDE 0.9 % IV SOLN
2.0000 g | Freq: Once | INTRAVENOUS | Status: AC
  Administered 2024-08-25: 2 g via INTRAVENOUS
  Filled 2024-08-25: qty 20

## 2024-08-25 NOTE — Assessment & Plan Note (Signed)
 No history of CHF On as needed Lasix  by cardiology Will hold Lasix  in view of mild worsening of baseline creatinine

## 2024-08-25 NOTE — ED Provider Notes (Signed)
 [facility] Provider Note    Event Date/Time   First MD Initiated Contact with Patient 08/25/24 1622     (approximate)   History   Chief Complaint: Shortness of Breath   HPI  [name] is a 89 y.o. female with history of COPD, GERD, hypertension who comes ED complaining of shortness of breath, worsening for the past 7 days.  Also associated with productive cough over the past few days.  Contacted her doctor who prescribed azithromycin  2 days ago which she has been taking during this time.  No improvement.  Gets very short of breath with walking at home.  Denies chest pain.  Recently returned from international travel.  Husband was sick with respiratory illness recently as well.        Past Medical History:  Diagnosis Date   C. difficile colitis    CAD (coronary artery disease)    a. s/p CABG 1999; b. Nuclear 10/11, no scar or ischemia, EF 68%; b. Lexiscan  Myoview  06/18/14: no evidence of infarction or ischemia, EF 77%, low risk study; c. 02/2019 MV: EF 56%, ? mild lat ischemia->low risk.   Carotid artery disease    a. Doppler January, 2012, 40-59% bilateral; b. 02/2017 U/S: 1-39% bilat ICA stenosis.   Closed fracture pubis (HCC) 07/04/2017   COPD (chronic obstructive pulmonary disease) (HCC)    Diffuse cystic mastopathy    Diverticulitis    last colonoscopy  incomplete Jan 2011   Dizziness    stable now (Oct 2011) patient tells me question of TIA, we will obtain records   Gait abnormality    Patient complains of wobbly gait, December, 2013   GERD (gastroesophageal reflux disease)    History of migraines    Hx of CABG    a. 3 vessel in 1999   Hyperlipidemia    Hypertension    Indigestion    3 weeks, October 2011   Kidney stones    Rheumatic fever    SCC (squamous cell carcinoma), face 08/01/2016   Sinus bradycardia    Mild, December, 2013   SOB (shortness of breath)    Syncopal episodes    after 18 holes of golf and increase  hydrochlorothiazide , resolved     Current Outpatient Rx   Order #: [id number] Class: Normal   Order #: [id number] Class: Normal   Order #: [id number] Class: Normal   Order #: [id number] Class: Normal   Order #: [id number] Class: Normal   Order #: [id number] Class: Normal   Order #: [id number] Class: Normal   Order #: [id number] Class: Normal   Order #: [id number] Class: Normal   Order #: [id number] Class: Normal   Order #: [id number] Class: Normal   Order #: [id number] Class: Normal   Order #: [id number] Class: Normal   Order #: [id number] Class: Normal   Order #: [id number] Class: Normal   Order #: [id number] Class: Normal   Order #: [id number] Class: Normal   Order #: [id number] Class: Historical Med    Past Surgical History:  Procedure Laterality Date   APPENDECTOMY  1950   BREAST BIOPSY Right 1994   BREAST CYST ASPIRATION     multiple BIL   CATARACT EXTRACTION Right 2009   CATARACT EXTRACTION Left 2011   COLONOSCOPY  8011,[id number]   Dr. Dellie   CORONARY ARTERY BYPASS GRAFT  1999   esophagus stretched     TONSILLECTOMY  1939   TOTAL ABDOMINAL HYSTERECTOMY  1968   due to metrorrhagia    Physical Exam   Triage Vital Signs: ED Triage Vitals  Encounter Vitals Group  BP 08/25/24 1221 (!) 153/72     Girls Systolic BP Percentile --      Girls Diastolic BP Percentile --      Boys Systolic BP Percentile --      Boys Diastolic BP Percentile --      Pulse Rate 08/25/24 1221 84     Resp 08/25/24 1221 20     Temp 08/25/24 1221 98 F (36.7 C)     Temp Source 08/25/24 1221 Oral     SpO2 08/25/24 1221 98 %     Weight --      Height --      Head Circumference --      Peak Flow --      Pain Score 08/25/24 1209 0     Pain Loc --      Pain Education --      Exclude from Growth Chart --     Most recent vital signs: Vitals:   08/25/24 1830 08/25/24 1857  BP: (!) 163/74   Pulse:    Resp: 16   Temp:    SpO2: 100% 98%    General: Awake, no distress.  CV:  Good peripheral perfusion.  Regular rate  rhythm Resp:  Normal effort.  Right-sided inspiratory crackles, diminished breath sounds at the right base Abd:  No distention.  Soft nontender Other:  Moist oral mucosa.  No lower extremity edema, calf tenderness, or asymmetry   ED Results / Procedures / Treatments   Labs (all labs ordered are listed, but only abnormal results are displayed) Labs Reviewed  RESP PANEL BY RT-PCR (RSV, FLU A&B, COVID)  [id number] - Abnormal; Notable for the following components:      Result Value   SARS Coronavirus 2 by RT PCR POSITIVE (*)    All other components within normal limits  BASIC METABOLIC PANEL WITH GFR - Abnormal; Notable for the following components:   Glucose, Bld 135 (*)    BUN 42 (*)    Creatinine, Ser 1.58 (*)    GFR, Estimated 30 (*)    All other components within normal limits  CBC - Abnormal; Notable for the following components:   RBC 3.58 (*)    MCV 107.0 (*)    All other components within normal limits  D-DIMER, QUANTITATIVE - Abnormal; Notable for the following components:   D-Dimer, Quant 1.12 (*)    All other components within normal limits  HIV ANTIBODY (ROUTINE TESTING W REFLEX)  BASIC METABOLIC PANEL WITH GFR  CBC     EKG Interpreted by me Sinus rhythm rate of 88.  Right axis, normal intervals.  No acute ischemic changes.   RADIOLOGY Chest x-ray interpreted by me, unremarkable.  Radiology report reviewed   CT chest shows right lower lobe pneumonia  PROCEDURES:  Procedures   MEDICATIONS ORDERED IN ED: Medications  enoxaparin  (LOVENOX ) 100 mg/mL injection 1 mg/kg (has no administration in time range)  atorvastatin  (LIPITOR) tablet 40 mg (has no administration in time range)  isosorbide  mononitrate (IMDUR ) 24 hr tablet 30 mg (has no administration in time range)  metoprolol  succinate (TOPROL -XL) 24 hr tablet 25 mg (has no administration in time range)  nitroGLYCERIN  (NITROSTAT ) SL tablet 0.4 mg (has no administration in time range)  ranolazine  (RANEXA ) 12  hr tablet 500 mg (has no administration in time range)  mirtazapine  (REMERON ) tablet 15 mg (has no administration in time range)  pantoprazole  (PROTONIX ) EC tablet 40 mg (has no administration in time range)  budesonide  (PULMICORT ) nebulizer solution  0.5 mg (has no administration in time range)  ipratropium-albuterol  (DUONEB) 0.5-2.5 (3) MG/3ML nebulizer solution 3 mL (has no administration in time range)  enoxaparin  (LOVENOX ) injection 40 mg (has no administration in time range)  acetaminophen  (TYLENOL ) tablet 650 mg (has no administration in time range)    Or  acetaminophen  (TYLENOL ) suppository 650 mg (has no administration in time range)  ondansetron  (ZOFRAN ) tablet 4 mg (has no administration in time range)    Or  ondansetron  (ZOFRAN ) injection 4 mg (has no administration in time range)  0.9 %  sodium chloride  infusion (has no administration in time range)  sodium chloride  0.9 % bolus 500 mL (has no administration in time range)  dexamethasone  (DECADRON ) tablet 6 mg (has no administration in time range)  cefTRIAXone  (ROCEPHIN ) 2 g in sodium chloride  0.9 % 100 mL IVPB (0 g Intravenous Stopped 08/25/24 1810)  albuterol  (PROVENTIL ) (2.5 MG/3ML) 0.083% nebulizer solution 5 mg (5 mg Nebulization Given 08/25/24 1848)     IMPRESSION / MDM / ASSESSMENT AND PLAN / ED COURSE  I reviewed the triage vital signs and the nursing notes.  DDx: COVID, influenza, pneumonia, pleural effusion, pulmonary embolism, AKI, electrolyte derangement, anemia  Patient's presentation is most consistent with acute presentation with potential threat to life or bodily function.     Clinical Course as of 08/25/24 2114  [place] Aug 25, 2024  1651 Comes to the ED due to shortness of breath, productive cough worsening for the past 7 days.  Has been on azithromycin  by PCP for the last 2 days without improvement.  She has hypoxic respiratory failure, requiring nasal cannula oxygen.  Will obtain CT chest, add Rocephin , plan  to admit. [PS]  1932 Case d/w hopspitalist [PS]    Clinical Course User Index [PS] Viviann Pastor, MD     FINAL CLINICAL IMPRESSION(S) / ED DIAGNOSES   Final diagnoses:  Acute hypoxemic respiratory failure due to COVID-19 [facility])  Community acquired pneumonia of right lower lobe of lung     Rx / DC Orders   ED Discharge Orders     None        Note:  This document was prepared using Dragon voice recognition software and may include unintentional dictation errors.   Viviann Pastor, MD 08/25/24 2115

## 2024-08-25 NOTE — Assessment & Plan Note (Signed)
 Suspect driven by pneumonia, however VTE a consideration given elevated D-dimer, recent travel/cruise Will give a therapeutic dose of Lovenox , hoping for improvement in creatinine so that she can get CTA chest to definitively rule out PE Will get lower extremity venous ultrasound Continue supplemental oxygen to wean as tolerated

## 2024-08-25 NOTE — Assessment & Plan Note (Signed)
 Mildly elevated on admission Continue home antihypertensives, metoprolol , losartan , propranolol 

## 2024-08-25 NOTE — Assessment & Plan Note (Signed)
 No complaints of chest pain and EKG nonacute Patient follows with cardiologist regularly and has had atypical chest pain in the past Continue home atorvastatin , Imdur , losartan , metoprolol , NTG as needed, propranolol 

## 2024-08-25 NOTE — Assessment & Plan Note (Signed)
 Essentially at baseline albeit with mild worsening: Creatinine 1.58 up from 1.35

## 2024-08-25 NOTE — Assessment & Plan Note (Signed)
Continue mirtazapine

## 2024-08-25 NOTE — H&P (Signed)
 History and Physical    Patient: [name] FMW:[id number] DOB: [birth year] DOA: 08/25/2024 DOS: the patient was seen and examined on 08/25/2024 PCP: Marylynn Verneita CROME, MD  Patient coming from: Home  Chief Complaint:  Chief Complaint  Patient presents with   Shortness of Breath    HPI: [name] is a 89 y.o. female with medical history significant for CAD s/p CABG (1999), former smoker, COPD, HTN,,CKD lllb, anxiety, venous stasis on as needed Lasix , being admitted with right lower lobe pneumonia, COVID-positive, with acute respiratory failure requiring 3 L.  She presented with a 1 week history of a cough productive of green phlegm, associated with shortness of breath that acutely worsened in the 24 previous hours.  She had received a prescription for azithromycin  and prednisone  taper from her PCP a few days prior but symptoms did not improve.  She denies chest pain.  She denies lower extremity swelling worse than her baseline or lower extremity pain.  Of note patient recently returned from a trip to the [place]. In the ED afebrile, pulse in the 60s to 80s, normal respirations but O2 as low as 84% on room air requiring 3 L to maintain sats in the high 90s, BP 153/72. WBC normal at 7.5 and respiratory viral panel positive for COVID.  D-dimer was elevated at 1.12 Creatinine 1.58 up from baseline of 1.35 a month ago. EKG showed sinus at 88 with PVCs Patient was unable to get CTA chest to evaluate for PE due to GFR being below cutoff.  CT chest without contrast showed findings suspicious for developing right lower lobe infiltrate as well as small right pleural effusion and scattered tree-in-bud nodules Patient treated with Rocephin  and albuterol  Admission requested     Past Medical History:  Diagnosis Date   C. difficile colitis    CAD (coronary artery disease)    a. s/p CABG 1999; b. Nuclear 10/11, no scar or ischemia, EF 68%; b. Lexiscan  Myoview  06/18/14: no evidence of  infarction or ischemia, EF 77%, low risk study; c. 02/2019 MV: EF 56%, ? mild lat ischemia->low risk.   Carotid artery disease    a. Doppler January, 2012, 40-59% bilateral; b. 02/2017 U/S: 1-39% bilat ICA stenosis.   Closed fracture pubis (HCC) 07/04/2017   COPD (chronic obstructive pulmonary disease) (HCC)    Diffuse cystic mastopathy    Diverticulitis    last colonoscopy  incomplete Jan 2011   Dizziness    stable now (Oct 2011) patient tells me question of TIA, we will obtain records   Gait abnormality    Patient complains of wobbly gait, December, 2013   GERD (gastroesophageal reflux disease)    History of migraines    Hx of CABG    a. 3 vessel in 1999   Hyperlipidemia    Hypertension    Indigestion    3 weeks, October 2011   Kidney stones    Rheumatic fever    SCC (squamous cell carcinoma), face 08/01/2016   Sinus bradycardia    Mild, December, 2013   SOB (shortness of breath)    Syncopal episodes    after 18 holes of golf and increase hydrochlorothiazide , resolved    Past Surgical History:  Procedure Laterality Date   APPENDECTOMY  1950   BREAST BIOPSY Right 1994   BREAST CYST ASPIRATION     multiple BIL   CATARACT EXTRACTION Right 2009   CATARACT EXTRACTION Left 2011   COLONOSCOPY  [id number]   Dr. Dellie   CORONARY  ARTERY BYPASS GRAFT  1999   esophagus stretched     TONSILLECTOMY  1939   TOTAL ABDOMINAL HYSTERECTOMY  1968   due to metrorrhagia   Social History:  reports that she quit smoking about 40 years ago. Her smoking use included cigarettes. She started smoking about 80 years ago. She has a 20 pack-year smoking history. She has never used smokeless tobacco. She reports that she does not currently use alcohol. She reports that she does not use drugs.  Allergies  Allergen Reactions   Amlodipine  Other (See Comments) and Swelling    Edema, lower extremity    Family History  Problem Relation Age of Onset   Heart disease Mother    Heart disease Father     Cancer Neg Hx    Drug abuse Neg Hx    Breast cancer Neg Hx     Prior to Admission medications   Medication Sig Start Date End Date Taking? Authorizing Provider  atorvastatin  (LIPITOR) 40 MG tablet Take 1 tablet (40 mg total) by mouth daily. 05/29/24   Marylynn Verneita CROME, MD  azithromycin  (ZITHROMAX ) 500 MG tablet Take 1 tablet (500 mg total) by mouth daily. 08/23/24   Marylynn Verneita CROME, MD  budesonide  (PULMICORT ) 0.5 MG/2ML nebulizer solution Take 2 mLs (0.5 mg total) by nebulization 2 (two) times daily. 07/29/24   Marylynn Verneita CROME, MD  clopidogrel  (PLAVIX ) 75 MG tablet TAKE 1 TABLET BY MOUTH DAILY 01/10/24   Gollan, Timothy J, MD  fluticasone  (FLONASE ) 50 MCG/ACT nasal spray Place 2 sprays into both nostrils daily. 02/27/23   Marylynn Verneita CROME, MD  furosemide  (LASIX ) 20 MG tablet Take 1 tablet ( 20 MG) two days a week. May take extra tablet (20 MG) once weekly as needed. 12/29/23   Gollan, Timothy J, MD  Ipratropium-Albuterol  (COMBIVENT  RESPIMAT) 20-100 MCG/ACT AERS respimat Inhale 1 puff into the lungs every 6 (six) hours as needed for wheezing. 08/09/24   Marylynn Verneita CROME, MD  ipratropium-albuterol  (DUONEB) 0.5-2.5 (3) MG/3ML SOLN Take 3 mLs by nebulization every 6 (six) hours as needed. 04/15/23   Marylynn Verneita CROME, MD  isosorbide  mononitrate (IMDUR ) 60 MG 24 hr tablet TAKE ONE AND ONE-HALF TABLET BY MOUTH TWICE DAILY 02/08/24   Gollan, Timothy J, MD  losartan  (COZAAR ) 100 MG tablet TAKE 1 TABLET BY MOUTH DAILY 02/08/24   Gollan, Timothy J, MD  metoprolol  succinate (TOPROL -XL) 25 MG 24 hr tablet Take 1 tablet (25 mg total) by mouth daily. 04/01/24   Gollan, Timothy J, MD  mirtazapine  (REMERON ) 15 MG tablet TAKE 1 TABLET BY MOUTH NIGHTLY 05/09/24   Tullo, Teresa L, MD  nitroGLYCERIN  (NITROSTAT ) 0.4 MG SL tablet PLACE 1 TABLET UNDER TONGUE EVERY 5 MINUTES AS NEEDED FOR CHEST PAIN. IF NO RELIEF IN 15 MINUTES CALL 911 ( MAXIMUM 3 TABLETS). 05/31/24   Gollan, Timothy J, MD  pantoprazole  (PROTONIX ) 40 MG tablet Take  1 tablet (40 mg total) by mouth daily. 05/29/24   Marylynn Verneita CROME, MD  predniSONE  (DELTASONE ) 10 MG tablet 6 tablets daily for 3 days, then reduce by 1 tablet daily until gone 08/23/24   Tullo, Teresa L, MD  propranolol  (INDERAL ) 20 MG tablet TAKE ONE TABLET 3 TIMES DAILY AS NEEDED FOR TACHYCARDIA 07/18/24   Gollan, Timothy J, MD  ranolazine  (RANEXA ) 500 MG 12 hr tablet TAKE 1 TABLET BY MOUTH TWICE DAILY 02/08/24   Gollan, Timothy J, MD  vitamin B-12 (CYANOCOBALAMIN ) 500 MCG tablet Take 500 mcg by mouth daily.  [provider]    Physical Exam: Vitals:   08/25/24 1730 08/25/24 1815 08/25/24 1830 08/25/24 1857  BP: (!) 167/94  (!) 163/74   Pulse: 68     Resp: 18  16   Temp:  97.7 F (36.5 C)    TempSrc:  Oral    SpO2: 97%  100% 98%   Physical Exam  Labs on Admission: I have personally reviewed following labs and imaging studies  CBC: Recent Labs  Lab 08/25/24 1210  WBC 7.5  HGB 12.1  HCT 38.3  MCV 107.0*  PLT 202   Basic Metabolic Panel: Recent Labs  Lab 08/25/24 1210  NA 141  K 4.6  CL 102  CO2 27  GLUCOSE 135*  BUN 42*  CREATININE 1.58*  CALCIUM  9.0   GFR: CrCl cannot be calculated (Unknown ideal weight.). Liver Function Tests: No results for input(s): AST, ALT, ALKPHOS, BILITOT, PROT, ALBUMIN in the last 168 hours. No results for input(s): LIPASE, AMYLASE in the last 168 hours. No results for input(s): AMMONIA in the last 168 hours. Coagulation Profile: No results for input(s): INR, PROTIME in the last 168 hours. Cardiac Enzymes: No results for input(s): CKTOTAL, CKMB, CKMBINDEX, TROPONINI in the last 168 hours. BNP (last 3 results) No results for input(s): PROBNP in the last 8760 hours. HbA1C: No results for input(s): HGBA1C in the last 72 hours. CBG: No results for input(s): GLUCAP in the last 168 hours. Lipid Profile: No results for input(s): CHOL, HDL, LDLCALC, TRIG, CHOLHDL, LDLDIRECT in the  last 72 hours. Thyroid  Function Tests: No results for input(s): TSH, T4TOTAL, FREET4, T3FREE, THYROIDAB in the last 72 hours. Anemia Panel: No results for input(s): VITAMINB12, FOLATE, FERRITIN, TIBC, IRON, RETICCTPCT in the last 72 hours. Urine analysis:    Component Value Date/Time   COLORURINE YELLOW 12/21/2023 1400   APPEARANCEUR CLEAR 12/21/2023 1400   APPEARANCEUR Clear 01/15/2021 1134   LABSPEC >=1.030 (A) 12/21/2023 1400   LABSPEC 1.025 10/13/2013 1038   PHURINE 6.0 12/21/2023 1400   GLUCOSEU NEGATIVE 12/21/2023 1400   HGBUR NEGATIVE 12/21/2023 1400   BILIRUBINUR NEGATIVE 12/21/2023 1400   BILIRUBINUR Negative 01/15/2021 1134   BILIRUBINUR Negative 10/13/2013 1038   KETONESUR TRACE (A) 12/21/2023 1400   PROTEINUR 100 (A) 04/19/2023 1723   UROBILINOGEN 0.2 12/21/2023 1400   NITRITE NEGATIVE 12/21/2023 1400   LEUKOCYTESUR TRACE (A) 12/21/2023 1400   LEUKOCYTESUR Negative 10/13/2013 1038    Radiological Exams on Admission: CT Chest Wo Contrast Result Date: 08/25/2024 CLINICAL DATA:  Respiratory illness, nondiagnostic x-ray. Shortness of breath. EXAM: CT CHEST WITHOUT CONTRAST TECHNIQUE: Multidetector CT imaging of the chest was performed following the standard protocol without IV contrast. RADIATION DOSE REDUCTION: This exam was performed according to the departmental dose-optimization program which includes automated exposure control, adjustment of the mA and/or kV according to patient size and/or use of iterative reconstruction technique. COMPARISON:  None Available. FINDINGS: Cardiovascular: The heart is enlarged and there is no pericardial effusion. Multi-vessel coronary artery calcifications are noted. There is evidence of prior cardiothoracic surgery. There is atherosclerotic calcification of the aorta without evidence of aneurysm. The pulmonary trunk is normal in caliber. Mediastinum/Nodes: No mediastinal or axillary lymphadenopathy. Evaluation of the  hila is limited due to lack of IV contrast. The thyroid  gland, trachea, and esophagus are within normal limits. There is a large hiatal hernia. Lungs/Pleura: A small pleural effusion is noted on the right. Pleural and parenchymal scarring are present at the lung apices bilaterally. A few scattered tree-in-bud  nodules are noted in the right upper and lower lobes atelectasis is noted bilaterally. Bronchial wall thickening is noted bilaterally. A few airspace opacities are present in the right lower lobe. No pneumothorax is seen. Upper Abdomen: Diverticula are noted along the colon without evidence of diverticulitis. No acute abnormality is seen. Musculoskeletal: Sternotomy wires are noted. Degenerative changes are present in the thoracic spine. No acute or suspicious osseous abnormality is seen. IMPRESSION: 1. Patchy airspace disease in the right lower lobe, suspicious for developing infiltrate. 2. Small right pleural effusion. 3. Scattered tree-in-bud nodules, likely infectious or inflammatory. 4. Large hiatal hernia. 5. Cardiomegaly with coronary artery calcifications. 6. Aortic atherosclerosis. Electronically Signed   By: Leita Birmingham M.D.   On: 08/25/2024 18:39   DG Chest 2 View Result Date: 08/25/2024 CLINICAL DATA:  Short of breath, weakness EXAM: CHEST - 2 VIEW COMPARISON:  07/08/2024 FINDINGS: Frontal and lateral views of the chest demonstrate postsurgical changes from CABG. The cardiac silhouette is unremarkable. There is chronic background scarring and emphysema, with small stable right pleural effusion or pleural thickening again noted. No acute airspace disease or pneumothorax. No acute bony abnormalities. IMPRESSION: 1. Stable chest, no acute process. Electronically Signed   By: Ozell Daring M.D.   On: 08/25/2024 13:18   Data Reviewed for HPI: Relevant notes from primary care and specialist visits, past discharge summaries as available in EHR, including Care Everywhere. Prior diagnostic testing  as pertinent to current admission diagnoses Updated medications and problem lists for reconciliation ED course, including vitals, labs, imaging, treatment and response to treatment Triage notes, nursing and pharmacy notes and ED provider's notes Notable results as noted above in HPI      Assessment and Plan: * CAP (community acquired pneumonia), right lower lobe COVID-positive Acute respiratory failure with hypoxia Patient presented with cough productive of green phlegm and shortness of breath, hypoxic to 84 Suspect primarily bacterial infection given lobar infiltrate Rocephin  and azithromycin  As needed albuterol  Antitussives and supportive care Incentive spirometer Will supplement treatment with oral dexamethasone  given COVID infection with hypoxia Supplemental oxygen and wean as tolerated Airborne precautions  Acute respiratory failure with hypoxia (HCC) Suspect driven by pneumonia, however VTE a consideration given elevated D-dimer, recent travel/cruise Will give a therapeutic dose of Lovenox , hoping for improvement in creatinine so that she can get CTA chest to definitively rule out PE Will get lower extremity venous ultrasound Continue supplemental oxygen to wean as tolerated  Coronary artery disease s/p CABG No complaints of chest pain and EKG nonacute Patient follows with cardiologist regularly and has had atypical chest pain in the past Continue home atorvastatin , Imdur , losartan , metoprolol , NTG as needed, propranolol   Chronic venous stasis No history of CHF On as needed Lasix  by cardiology Will hold Lasix  in view of mild worsening of baseline creatinine  Anxiety disorder Continue mirtazapine   Essential (primary) hypertension Mildly elevated on admission Continue home antihypertensives, metoprolol , losartan , propranolol   COPD, moderate (HCC) Continue home inhalers DuoNebs as needed  Stage 3b chronic kidney disease (HCC) Essentially at baseline albeit with  mild worsening: Creatinine 1.58 up from 1.35    DVT prophylaxis: Lovenox   Consults: none  Advance Care Planning:   Code Status: Prior   Family Communication: none***  Disposition Plan: Back to previous home environment  Severity of Illness: The appropriate patient status for this patient is OBSERVATION. Observation status is judged to be reasonable and necessary in order to provide the required intensity of service to ensure the patient's safety. The patient's  presenting symptoms, physical exam findings, and initial radiographic and laboratory data in the context of their medical condition is felt to place them at decreased risk for further clinical deterioration. Furthermore, it is anticipated that the patient will be medically stable for discharge from the hospital within 2 midnights of admission.   Author: Delayne LULLA Solian, MD 08/25/2024 7:54 PM  For on call review [email or web address].

## 2024-08-25 NOTE — ED Triage Notes (Signed)
 Pt comes via [facility] from home with c/o sob. Pt [place] got [place] from [place]. Pt states this started on Monday. Pt states weakness. Pt states it got worse yesterday.   Pt was 84% on RA. Pt placed on 3L at 96 %. Pt does have productive green cough. Pt has hx of copd. HR 89 CBG 112

## 2024-08-25 NOTE — Assessment & Plan Note (Signed)
 Continue home inhalers DuoNebs as needed

## 2024-08-25 NOTE — Assessment & Plan Note (Signed)
 COVID-positive COPD exacerbation Acute respiratory failure with hypoxia Patient presented with cough productive of green phlegm and shortness of breath, hypoxic to 84 Suspect primarily bacterial infection given lobar infiltrate Rocephin  and azithromycin  Bronchodilators and IV steroids Antitussives and supportive care Incentive spirometer Supplemental oxygen and wean as tolerated Airborne precautions

## 2024-08-26 DIAGNOSIS — I878 Other specified disorders of veins: Secondary | ICD-10-CM | POA: Diagnosis present

## 2024-08-26 DIAGNOSIS — Z8249 Family history of ischemic heart disease and other diseases of the circulatory system: Secondary | ICD-10-CM | POA: Diagnosis not present

## 2024-08-26 DIAGNOSIS — E875 Hyperkalemia: Secondary | ICD-10-CM | POA: Diagnosis present

## 2024-08-26 DIAGNOSIS — K219 Gastro-esophageal reflux disease without esophagitis: Secondary | ICD-10-CM | POA: Diagnosis present

## 2024-08-26 DIAGNOSIS — I2510 Atherosclerotic heart disease of native coronary artery without angina pectoris: Secondary | ICD-10-CM | POA: Diagnosis present

## 2024-08-26 DIAGNOSIS — I130 Hypertensive heart and chronic kidney disease with heart failure and stage 1 through stage 4 chronic kidney disease, or unspecified chronic kidney disease: Secondary | ICD-10-CM | POA: Diagnosis present

## 2024-08-26 DIAGNOSIS — N1832 Chronic kidney disease, stage 3b: Secondary | ICD-10-CM | POA: Diagnosis present

## 2024-08-26 DIAGNOSIS — J189 Pneumonia, unspecified organism: Secondary | ICD-10-CM | POA: Diagnosis present

## 2024-08-26 DIAGNOSIS — E785 Hyperlipidemia, unspecified: Secondary | ICD-10-CM | POA: Diagnosis present

## 2024-08-26 DIAGNOSIS — Z951 Presence of aortocoronary bypass graft: Secondary | ICD-10-CM | POA: Diagnosis not present

## 2024-08-26 DIAGNOSIS — Z87442 Personal history of urinary calculi: Secondary | ICD-10-CM | POA: Diagnosis not present

## 2024-08-26 DIAGNOSIS — I5033 Acute on chronic diastolic (congestive) heart failure: Secondary | ICD-10-CM | POA: Diagnosis present

## 2024-08-26 DIAGNOSIS — E873 Alkalosis: Secondary | ICD-10-CM | POA: Diagnosis present

## 2024-08-26 DIAGNOSIS — J9601 Acute respiratory failure with hypoxia: Secondary | ICD-10-CM | POA: Diagnosis present

## 2024-08-26 DIAGNOSIS — Z7902 Long term (current) use of antithrombotics/antiplatelets: Secondary | ICD-10-CM | POA: Diagnosis not present

## 2024-08-26 DIAGNOSIS — J440 Chronic obstructive pulmonary disease with acute lower respiratory infection: Secondary | ICD-10-CM | POA: Diagnosis present

## 2024-08-26 DIAGNOSIS — Z90710 Acquired absence of both cervix and uterus: Secondary | ICD-10-CM | POA: Diagnosis not present

## 2024-08-26 DIAGNOSIS — F419 Anxiety disorder, unspecified: Secondary | ICD-10-CM | POA: Diagnosis present

## 2024-08-26 DIAGNOSIS — J441 Chronic obstructive pulmonary disease with (acute) exacerbation: Secondary | ICD-10-CM | POA: Diagnosis present

## 2024-08-26 DIAGNOSIS — U071 COVID-19: Secondary | ICD-10-CM | POA: Diagnosis present

## 2024-08-26 DIAGNOSIS — J1282 Pneumonia due to coronavirus disease 2019: Secondary | ICD-10-CM | POA: Diagnosis present

## 2024-08-26 DIAGNOSIS — I493 Ventricular premature depolarization: Secondary | ICD-10-CM | POA: Diagnosis present

## 2024-08-26 DIAGNOSIS — Z7951 Long term (current) use of inhaled steroids: Secondary | ICD-10-CM | POA: Diagnosis not present

## 2024-08-26 DIAGNOSIS — Z1152 Encounter for screening for COVID-19: Secondary | ICD-10-CM | POA: Diagnosis not present

## 2024-08-26 DIAGNOSIS — Z87891 Personal history of nicotine dependence: Secondary | ICD-10-CM | POA: Diagnosis not present

## 2024-08-26 LAB — CBC
HCT: 33.1 % — ABNORMAL LOW (ref 36.0–46.0)
Hemoglobin: 10.3 g/dL — ABNORMAL LOW (ref 12.0–15.0)
MCH: 34 pg (ref 26.0–34.0)
MCHC: 31.1 g/dL (ref 30.0–36.0)
MCV: 109.2 fL — ABNORMAL HIGH (ref 80.0–100.0)
Platelets: 174 K/uL (ref 150–400)
RBC: 3.03 MIL/uL — ABNORMAL LOW (ref 3.87–5.11)
RDW: 12.2 % (ref 11.5–15.5)
WBC: 7.2 K/uL (ref 4.0–10.5)
nRBC: 0 % (ref 0.0–0.2)

## 2024-08-26 LAB — BASIC METABOLIC PANEL WITH GFR
Anion gap: 12 (ref 5–15)
BUN: 44 mg/dL — ABNORMAL HIGH (ref 8–23)
CO2: 22 mmol/L (ref 22–32)
Calcium: 8.1 mg/dL — ABNORMAL LOW (ref 8.9–10.3)
Chloride: 107 mmol/L (ref 98–111)
Creatinine, Ser: 1.55 mg/dL — ABNORMAL HIGH (ref 0.44–1.00)
GFR, Estimated: 30 mL/min — ABNORMAL LOW (ref >60)
Glucose, Bld: 130 mg/dL — ABNORMAL HIGH (ref 70–99)
Potassium: 4.9 mmol/L (ref 3.5–5.1)
Sodium: 141 mmol/L (ref 135–145)

## 2024-08-26 LAB — HIV ANTIBODY (ROUTINE TESTING W REFLEX): HIV Screen 4th Generation wRfx: NONREACTIVE

## 2024-08-26 MED ORDER — METHYLPREDNISOLONE SODIUM SUCC 40 MG IJ SOLR
40.0000 mg | Freq: Two times a day (BID) | INTRAMUSCULAR | Status: AC
  Administered 2024-08-26 (×2): 40 mg via INTRAVENOUS
  Filled 2024-08-26 (×2): qty 1

## 2024-08-26 MED ORDER — IPRATROPIUM-ALBUTEROL 0.5-2.5 (3) MG/3ML IN SOLN
3.0000 mL | Freq: Two times a day (BID) | RESPIRATORY_TRACT | Status: DC
  Administered 2024-08-26 – 2024-08-29 (×4): 3 mL via RESPIRATORY_TRACT
  Filled 2024-08-26 (×5): qty 3

## 2024-08-26 MED ORDER — SENNOSIDES-DOCUSATE SODIUM 8.6-50 MG PO TABS
2.0000 | ORAL_TABLET | Freq: Every day | ORAL | Status: DC
  Administered 2024-08-26 – 2024-09-01 (×6): 2 via ORAL
  Filled 2024-08-26 (×7): qty 2

## 2024-08-26 MED ORDER — IPRATROPIUM-ALBUTEROL 0.5-2.5 (3) MG/3ML IN SOLN
3.0000 mL | Freq: Four times a day (QID) | RESPIRATORY_TRACT | Status: DC
  Administered 2024-08-26 (×2): 3 mL via RESPIRATORY_TRACT
  Filled 2024-08-26 (×2): qty 3

## 2024-08-26 MED ORDER — ENSURE PLUS HIGH PROTEIN PO LIQD
237.0000 mL | Freq: Two times a day (BID) | ORAL | Status: DC
  Administered 2024-08-26 – 2024-09-02 (×13): 237 mL via ORAL

## 2024-08-26 MED ORDER — ADULT MULTIVITAMIN W/MINERALS CH
1.0000 | ORAL_TABLET | Freq: Every day | ORAL | Status: DC
  Administered 2024-08-26 – 2024-09-02 (×8): 1 via ORAL
  Filled 2024-08-26 (×8): qty 1

## 2024-08-26 MED ORDER — SODIUM CHLORIDE 0.9 % IV SOLN
2.0000 g | INTRAVENOUS | Status: DC
  Administered 2024-08-26 – 2024-08-28 (×3): 2 g via INTRAVENOUS
  Filled 2024-08-26 (×4): qty 20

## 2024-08-26 MED ORDER — SODIUM CHLORIDE 0.9 % IV SOLN
500.0000 mg | INTRAVENOUS | Status: DC
  Administered 2024-08-26 – 2024-08-28 (×3): 500 mg via INTRAVENOUS
  Filled 2024-08-26 (×3): qty 5

## 2024-08-26 MED ORDER — PREDNISONE 20 MG PO TABS
40.0000 mg | ORAL_TABLET | Freq: Every day | ORAL | Status: DC
  Administered 2024-08-27 – 2024-08-29 (×3): 40 mg via ORAL
  Filled 2024-08-26 (×3): qty 2
  Filled 2024-08-26: qty 4

## 2024-08-26 NOTE — Evaluation (Signed)
 Occupational Therapy Evaluation Patient Details Name: [name] MRN: [id number] DOB: Aug 19, 1926 Today's Date: 08/26/2024   History of Present Illness   Pt is a 89 y.o. female being admitted with COVID-positive RLL pneumonia/COPD exacerbation, and new O2 requirement of 3 L.  She presented with a 1 week history of a cough productive of green phlegm, associated with shortness of breath and wheezing  in the setting of a recent return from an overseas trip. PMH of CAD s/p CABG (1999), former smoker, COPD, HTN,,CKD lllb, anxiety, venous stasis on as needed Lasix .     Clinical Impressions Pt was seen for OT evaluation this date. PTA, she is independent with ADLs and has family assist for IADLs. Limited community ambulatory with [facility], recently returned from a trip to the [place]. Pt presents with deficits in activity tolerance, affecting safe and optimal ADL completion. Pt currently requires MOD I for STS and ambulation within the room ~40 ft using SPC with no LOB. Sp02 monitored and was 97% on 2L at rest and desat to 92% after mobility on 2L via [place]. Pt reports increased fatigue 8/10 after short distance ambulation. Anticipate Mod I to supervision for ADL performance at this time. Will follow acutely to prevent further decline with no OT follow up indicated.      If plan is discharge home, recommend the following:   A little help with walking and/or transfers     Functional Status Assessment   Patient has had a recent decline in their functional status and demonstrates the ability to make significant improvements in function in a reasonable and predictable amount of time.     Equipment Recommendations   None recommended by OT     Recommendations for Other Services         Precautions/Restrictions   Precautions Precautions: Fall Recall of Precautions/Restrictions: Intact Restrictions Weight Bearing Restrictions Per Provider Order: No     Mobility Bed Mobility                General bed mobility comments: NT in recliner pre/post session    Transfers Overall transfer level: Modified independent Equipment used: Straight cane               General transfer comment: able to stand from recliner and ambulate 40 ft using SPC with MOD I and no LOB, sp02 desat to 92% during/after ambulation on 2L 02 via [place]      Balance Overall balance assessment: Modified Independent                                         ADL either performed or assessed with clinical judgement   ADL Overall ADL's : Needs assistance/impaired                                       General ADL Comments: anticipate MOD I to supervision level for ADLs at this time based on mobility and fatigue     Vision Patient Visual Report: No change from baseline       Perception         Praxis         Pertinent Vitals/Pain Pain Assessment Pain Assessment: No/denies pain     Extremity/Trunk Assessment Upper Extremity Assessment Upper Extremity Assessment: Overall WFL for tasks assessed;Generalized weakness  Lower Extremity Assessment Lower Extremity Assessment: Overall WFL for tasks assessed;Generalized weakness       Communication Communication Communication: Impaired Factors Affecting Communication: Hearing impaired   Cognition Arousal: Alert Behavior During Therapy: WFL for tasks assessed/performed                                 Following commands: Intact       Cueing  General Comments      fatigues very easily with minimal activity   Exercises Other Exercises Other Exercises: Edu on role of OT in acute setting.   Shoulder Instructions      Home Living Family/patient expects to be discharged to:: Private residence Living Arrangements: Alone Available Help at Discharge: Family;Available 24 hours/day (DTR lives close by (primary support), works full time) Type of Home: House Home Access: Stairs to  enter Secretary/administrator of Steps: 2 Entrance Stairs-Rails: Left;Right Home Layout: Multi-level;Able to live on main level with bedroom/bathroom Alternate Level Stairs-Number of Steps: bedroom on main level             Home Equipment: Cane - single point;Rolling Walker (2 wheels)          Prior Functioning/Environment Prior Level of Function : Independent/Modified Independent             Mobility Comments: typcially tolerates community distance AMB, jus tgo tback from trip to [place], not trouble walking airport and there. ADLs Comments: bathes, dresses self; does not drive, no falls    OT Problem List: Decreased activity tolerance;Decreased strength   OT Treatment/Interventions: Self-care/ADL training;Therapeutic exercise;Therapeutic activities;Energy conservation;DME and/or AE instruction;Balance training;Patient/family education      OT Goals(Current goals can be found in the care plan section)   Acute Rehab OT Goals Patient Stated Goal: go home OT Goal Formulation: With patient/family Time For Goal Achievement: 09/09/24 Potential to Achieve Goals: Good ADL Goals Additional ADL Goal #1: Pt will demo implementation of 1 learned ECS during ADL performance 2/2 trials to prevent overexertion and maxmize pt safety/IND.   OT Frequency:  Min 2X/week    Co-evaluation              AM-PAC OT 6 Clicks Daily Activity     Outcome Measure Help from another person eating meals?: None Help from another person taking care of personal grooming?: None Help from another person toileting, which includes using toliet, bedpan, or urinal?: None Help from another person bathing (including washing, rinsing, drying)?: A Little Help from another person to put on and taking off regular upper body clothing?: None Help from another person to put on and taking off regular lower body clothing?: None 6 Click Score: 23   End of Session Equipment Utilized During Treatment:  Oxygen Nurse Communication: Mobility status  Activity Tolerance: Patient tolerated treatment well Patient left: in chair;with call bell/phone within reach;with family/visitor present  OT Visit Diagnosis: Other abnormalities of gait and mobility (R26.89)                Time: 8943-8884 OT Time Calculation (min): 19 min Charges:  OT General Charges $OT Visit: 1 Visit OT Evaluation $OT Eval Low Complexity: 1 Low Yaniv Lage Allen, OTR/L  08/26/24, 11:42 AM  [name] 08/26/2024, 11:40 AM

## 2024-08-26 NOTE — Progress Notes (Signed)
 PROGRESS NOTE    [name]  FMW:[id number] DOB: 16-Jun-1926 DOA: 08/25/2024 PCP: Marylynn Verneita CROME, MD   Brief Narrative:   [name] is a 89 y.o. female with medical history significant for CAD s/p CABG (1999), former smoker, COPD, HTN,,CKD lllb, anxiety, venous stasis on as needed Lasix , being admitted with COVID-positive RLL pneumonia/COPD exacerbation, and new O2 requirement of 3 L.  She presented with a 1 week history of a cough productive of green phlegm, associated with shortness of breath and wheezing  in the setting of a recent return from an overseas trip.  She had received a prescription for azithromycin  and prednisone  taper from her PCP a few days prior but symptoms did not improve.  She denies chest pain.  She denies lower extremity swelling worse than her baseline or lower extremity pain.   In the ED afebrile, pulse in the 60s to 80s, normal respirations but O2 as low as 84% on room air requiring 3 L to maintain sats in the high 90s, BP 153/72. WBC normal at 7.5 and respiratory viral panel positive for COVID.  D-dimer was elevated at 1.12 Creatinine 1.58 up from baseline of 1.35 a month ago. EKG showed sinus at 88 with PVCs Patient was unable to get CTA chest to evaluate for PE due to GFR being below cutoff.  CT chest without contrast showed findings suspicious for developing right lower lobe infiltrate as well as small right pleural effusion and scattered tree-in-bud nodules  Patient initiated on IV antibiotics.  Admitted for further management    Assessment & Plan:   Principal Problem:   CAP (community acquired pneumonia), right lower lobe Active Problems:   COVID-19 virus infection   COPD with acute exacerbation (HCC)   Acute respiratory failure with hypoxia (HCC)   Coronary artery disease s/p CABG   Chronic venous stasis   Stage 3b chronic kidney disease (HCC)   Essential (primary) hypertension   Anxiety disorder   * CAP (community acquired  pneumonia), right lower lobe COVID-positive COPD exacerbation Acute respiratory failure with hypoxia Patient presented with cough productive of green phlegm and shortness of breath, hypoxic to 84 COVID-19 positive, suspect additional bacterial infection given lobar infiltrate Rocephin  and azithromycin  Bronchodilators and IV steroids Antitussives and supportive care Incentive spirometer Supplemental oxygen and wean as tolerated, currently 3 L/min. Airborne precautions Discussed possible remdesivir however eGFR is low.   Acute respiratory failure with hypoxia (HCC) Suspect driven by pneumonia, however VTE a consideration given elevated D-dimer, recent travel/cruise CTA could not be performed due to low GFR.  Await lower extremity Doppler. Discussed VQ scan with daughter at the bedside.  Patient is feeling better, and she would like to hold off on further testing at this time.  Coronary artery disease s/p CABG No complaints of chest pain and EKG nonacute Patient follows with cardiologist regularly and has had atypical chest pain in the past Continue home atorvastatin , Imdur , losartan , metoprolol , NTG as needed, propranolol    Chronic venous stasis No history of CHF PTA on as needed Lasix  by cardiology Will hold Lasix  in view of mild worsening of baseline creatinine   Anxiety disorder Continue mirtazapine    Essential (primary) hypertension Mildly elevated on admission Continue home antihypertensives, metoprolol , losartan , propranolol    Stage 3b chronic kidney disease (HCC) Essentially at baseline albeit with mild worsening: Creatinine 1.58 up from 1.35       DVT prophylaxis: Lovenox    Consults: none   Advance Care Planning:   Code Status: Prior  Family Communication: none   Disposition Plan: Back to previous home environment   Severity of Illness: The appropriate patient status for this patient is OBSERVATION. Observation status is judged to be reasonable and  necessary in order to provide the required intensity of service to ensure the patient's safety. The patient's presenting symptoms, physical exam findings, and initial radiographic and laboratory data in the context of their medical condition is felt to place them at decreased risk for further clinical deterioration. Furthermore, it is anticipated that the patient will be medically stable for discharge from the hospital within 2 midnights of admission.   Subjective:  Patient seen and examined at the bedside.  Daughter present.  She feels somewhat better.  Feels very tired and weak.  Has some dry cough.  Vital signs are stable.  O2 requirement 3 L/min, satting 100%.  Objective: Vitals:   08/26/24 0327 08/26/24 0633 08/26/24 0915 08/26/24 0929  BP: (!) 151/71 (!) 158/80  (!) 147/72  Pulse: 79 74  78  Resp: 20   20  Temp: 98.1 F (36.7 C) (!) 96.5 F (35.8 C)  (!) 97.5 F (36.4 C)  TempSrc:  Oral  Oral  SpO2: 100% 100% (!) 84% 100%  Weight:  55.1 kg    Height:        Intake/Output Summary (Last 24 hours) at 08/26/2024 1231 Last data filed at 08/25/2024 1810 Gross per 24 hour  Intake 100 ml  Output --  Net 100 ml   Filed Weights   08/25/24 2010 08/26/24 0633  Weight: 59.5 kg 55.1 kg    Examination:  Vitals and nursing note reviewed.  Constitutional:      General: She is not in acute distress.    Comments: Elderly female, alert and awake. cough.  HENT:     Head: Normocephalic and atraumatic.  Cardiovascular:     Rate and Rhythm: Normal rate and regular rhythm.     Heart sounds: Normal heart sounds.  Pulmonary:     Breath sounds: mild Wheezing present.  Abdominal:     Palpations: Abdomen is soft.     Tenderness: There is no abdominal tenderness.  Neurological:     Mental Status: Mental status is at baseline.    Data Reviewed: I have personally reviewed following labs and imaging studies  CBC: Recent Labs  Lab 08/25/24 1210 08/26/24 0355  WBC 7.5 7.2  HGB 12.1  10.3*  HCT 38.3 33.1*  MCV 107.0* 109.2*  PLT 202 174   Basic Metabolic Panel: Recent Labs  Lab 08/25/24 1210 08/26/24 0355  NA 141 141  K 4.6 4.9  CL 102 107  CO2 27 22  GLUCOSE 135* 130*  BUN 42* 44*  CREATININE 1.58* 1.55*  CALCIUM  9.0 8.1*   GFR: Estimated Creatinine Clearance: 16.8 mL/min (A) (by C-G formula based on SCr of 1.55 mg/dL (H)). Liver Function Tests: No results for input(s): AST, ALT, ALKPHOS, BILITOT, PROT, ALBUMIN in the last 168 hours. No results for input(s): LIPASE, AMYLASE in the last 168 hours. No results for input(s): AMMONIA in the last 168 hours. Coagulation Profile: No results for input(s): INR, PROTIME in the last 168 hours. Cardiac Enzymes: No results for input(s): CKTOTAL, CKMB, CKMBINDEX, TROPONINI in the last 168 hours. BNP (last 3 results) No results for input(s): PROBNP in the last 8760 hours. HbA1C: No results for input(s): HGBA1C in the last 72 hours. CBG: No results for input(s): GLUCAP in the last 168 hours. Lipid Profile: No results for input(s): CHOL,  HDL, LDLCALC, TRIG, CHOLHDL, LDLDIRECT in the last 72 hours. Thyroid  Function Tests: No results for input(s): TSH, T4TOTAL, FREET4, T3FREE, THYROIDAB in the last 72 hours. Anemia Panel: No results for input(s): VITAMINB12, FOLATE, FERRITIN, TIBC, IRON, RETICCTPCT in the last 72 hours. Sepsis Labs: No results for input(s): PROCALCITON, LATICACIDVEN in the last 168 hours.  Recent Results (from the past 240 hours)  Resp panel by RT-PCR (RSV, Flu A&B, Covid) Anterior Nasal Swab     Status: Abnormal   Collection Time: 08/25/24  5:40 PM   Specimen: Anterior Nasal Swab  Result Value Ref Range Status   SARS Coronavirus 2 by RT PCR POSITIVE (A) NEGATIVE Final    Comment: (NOTE) SARS-CoV-2 target nucleic acids are DETECTED.  The SARS-CoV-2 RNA is generally detectable in upper respiratory specimens during the  acute phase of infection. Positive results are indicative of the presence of the identified virus, but do not rule out bacterial infection or co-infection with other pathogens not detected by the test. Clinical correlation with patient history and other diagnostic information is necessary to determine patient infection status. The expected result is Negative.  Fact Sheet for Patients: bloggercourse.com  Fact Sheet for Healthcare Providers: seriousbroker.it  This test is not yet approved or cleared by the [place]  FDA and  has been authorized for detection and/or diagnosis of SARS-CoV-2 by FDA under an Emergency Use Authorization (EUA).  This EUA will remain in effect (meaning this test can be used) for the duration of  the COVID-19 declaration under Section 564(b)(1) of the A ct, 21 U.S.C. section 360bbb-3(b)(1), unless the authorization is terminated or revoked sooner.     Influenza A by PCR NEGATIVE NEGATIVE Final   Influenza B by PCR NEGATIVE NEGATIVE Final    Comment: (NOTE) The Xpert Xpress SARS-CoV-2/FLU/RSV plus assay is intended as an aid in the diagnosis of influenza from Nasopharyngeal swab specimens and should not be used as a sole basis for treatment. Nasal washings and aspirates are unacceptable for Xpert Xpress SARS-CoV-2/FLU/RSV testing.  Fact Sheet for Patients: bloggercourse.com  Fact Sheet for Healthcare Providers: seriousbroker.it  This test is not yet approved or cleared by the [place]  FDA and has been authorized for detection and/or diagnosis of SARS-CoV-2 by FDA under an Emergency Use Authorization (EUA). This EUA will remain in effect (meaning this test can be used) for the duration of the COVID-19 declaration under Section 564(b)(1) of the Act, 21 U.S.C. section 360bbb-3(b)(1), unless the authorization is terminated or revoked.     Resp  Syncytial Virus by PCR NEGATIVE NEGATIVE Final    Comment: (NOTE) Fact Sheet for Patients: bloggercourse.com  Fact Sheet for Healthcare Providers: seriousbroker.it  This test is not yet approved or cleared by the [place]  FDA and has been authorized for detection and/or diagnosis of SARS-CoV-2 by FDA under an Emergency Use Authorization (EUA). This EUA will remain in effect (meaning this test can be used) for the duration of the COVID-19 declaration under Section 564(b)(1) of the Act, 21 U.S.C. section 360bbb-3(b)(1), unless the authorization is terminated or revoked.  Performed at [facility], [address] [place], [place] [address]          Radiology Studies: US  Venous Img Lower Bilateral (DVT) Result Date: 08/25/2024 EXAM: ULTRASOUND DUPLEX OF THE BILATERAL LOWER EXTREMITY VEINS TECHNIQUE: Duplex ultrasound using B-mode/gray scaled imaging and Doppler spectral analysis and color flow was obtained of the deep venous structures of the bilateral lower extremity. COMPARISON: None available. CLINICAL HISTORY: Dyspnea on  exertion. FINDINGS: LEFT: The common femoral vein, femoral vein, popliteal vein, and posterior tibial vein of the left lower extremity demonstrate normal compressibility with normal color flow and spectral analysis. RIGHT: The common femoral vein, femoral vein, popliteal vein, and posterior tibial vein of the right lower extremity demonstrate normal compressibility with normal color flow and spectral analysis. IMPRESSION: 1. No evidence of deep venous thrombosis in the bilateral lower extremities. Electronically signed by: Franky Crease MD 08/25/2024 08:42 PM EST RP Workstation: [id number]   CT Chest Wo Contrast Result Date: 08/25/2024 CLINICAL DATA:  Respiratory illness, nondiagnostic x-ray. Shortness of breath. EXAM: CT CHEST WITHOUT CONTRAST TECHNIQUE: Multidetector CT imaging of the chest was performed  following the standard protocol without IV contrast. RADIATION DOSE REDUCTION: This exam was performed according to the departmental dose-optimization program which includes automated exposure control, adjustment of the mA and/or kV according to patient size and/or use of iterative reconstruction technique. COMPARISON:  None Available. FINDINGS: Cardiovascular: The heart is enlarged and there is no pericardial effusion. Multi-vessel coronary artery calcifications are noted. There is evidence of prior cardiothoracic surgery. There is atherosclerotic calcification of the aorta without evidence of aneurysm. The pulmonary trunk is normal in caliber. Mediastinum/Nodes: No mediastinal or axillary lymphadenopathy. Evaluation of the hila is limited due to lack of IV contrast. The thyroid  gland, trachea, and esophagus are within normal limits. There is a large hiatal hernia. Lungs/Pleura: A small pleural effusion is noted on the right. Pleural and parenchymal scarring are present at the lung apices bilaterally. A few scattered tree-in-bud nodules are noted in the right upper and lower lobes atelectasis is noted bilaterally. Bronchial wall thickening is noted bilaterally. A few airspace opacities are present in the right lower lobe. No pneumothorax is seen. Upper Abdomen: Diverticula are noted along the colon without evidence of diverticulitis. No acute abnormality is seen. Musculoskeletal: Sternotomy wires are noted. Degenerative changes are present in the thoracic spine. No acute or suspicious osseous abnormality is seen. IMPRESSION: 1. Patchy airspace disease in the right lower lobe, suspicious for developing infiltrate. 2. Small right pleural effusion. 3. Scattered tree-in-bud nodules, likely infectious or inflammatory. 4. Large hiatal hernia. 5. Cardiomegaly with coronary artery calcifications. 6. Aortic atherosclerosis. Electronically Signed   By: Leita Birmingham M.D.   On: 08/25/2024 18:39   DG Chest 2 View Result  Date: 08/25/2024 CLINICAL DATA:  Short of breath, weakness EXAM: CHEST - 2 VIEW COMPARISON:  07/08/2024 FINDINGS: Frontal and lateral views of the chest demonstrate postsurgical changes from CABG. The cardiac silhouette is unremarkable. There is chronic background scarring and emphysema, with small stable right pleural effusion or pleural thickening again noted. No acute airspace disease or pneumothorax. No acute bony abnormalities. IMPRESSION: 1. Stable chest, no acute process. Electronically Signed   By: Ozell Daring M.D.   On: 08/25/2024 13:18        Scheduled Meds:  atorvastatin   40 mg Oral Daily   budesonide   0.5 mg Nebulization BID   enoxaparin  (LOVENOX ) injection  30 mg Subcutaneous Q24H   ipratropium-albuterol   3 mL Nebulization BID   isosorbide  mononitrate  30 mg Oral BID   methylPREDNISolone  (SOLU-MEDROL ) injection  40 mg Intravenous Q12H   Followed by   NOREEN ON 08/27/2024] predniSONE   40 mg Oral Q breakfast   metoprolol  succinate  25 mg Oral Daily   mirtazapine   15 mg Oral QHS   multivitamin with minerals  1 tablet Oral Daily   pantoprazole   40 mg Oral Daily   ranolazine   500 mg Oral BID   Continuous Infusions:  azithromycin  500 mg (08/26/24 1013)   cefTRIAXone  (ROCEPHIN )  IV            Nevelyn Mellott, MD [facility] 08/26/2024, 12:31 PM

## 2024-08-26 NOTE — Progress Notes (Signed)
 Initial Nutrition Assessment  DOCUMENTATION CODES:   Not applicable  INTERVENTION:   -Liberalize diet to regular for widest variety of meal selections -MVI with minerals daily -Ensure Plus High Protein po BID, each supplement provides 350 kcal and 20 grams of protein   NUTRITION DIAGNOSIS:   Increased nutrient needs related to chronic illness (COPD) as evidenced by estimated needs.  GOAL:   Patient will meet greater than or equal to 90% of their needs  MONITOR:   PO intake, Supplement acceptance  REASON FOR ASSESSMENT:   Consult Assessment of nutrition requirement/status  ASSESSMENT:   89 y.o. female with medical history significant for CAD s/p CABG (1999), former smoker, COPD, HTN,,CKD lllb, anxiety, venous stasis on as needed Lasix , being admitted with COVID-positive RLL pneumonia/COPD exacerbation, and new O2 requirement of 3 L.  She presented with a 1 week history of a cough productive of green phlegm, associated with shortness of breath and wheezing  in the setting of a recent return from an overseas trip.  She had received a prescription for azithromycin  and prednisone  taper from her PCP a few days prior but symptoms did not improve.  Patient admitted with CAP, COVID, and COPD exacerbation.   Reviewed I/O's: +100 ml x 24 hours  Spoke with patient at bedside, who was sitting in recliner chair at time of visit. Daughter was present and also contributed with history, as patient is hard of hearing. Patient reports she did not eat much yesterday due to being in the ED, but ate a good breakfast (eggs, pancakes, and banana). PTA patient with a fair appetite, eating 3 meals per day. Per daughter, she does not eat a lot at baseline, but changes in her usual intake.   Patient shares that she recently returned on a trip to the [place] to celebrate her birthday with her family. She suspects that she may have contacted COVID when travelling. She denies loss of smell or taste.    Reviewed weight history. Weight has been stable over the past 6 months. Patient and daughter deny history of weight loss; her UBW is around 120# and lasix  is doses based upon patient's weight trends.   Discussed importance of good meal and supplement intake to promote healing. Patient amenable to supplements. She has taken Ensure in the past, but is not consistent with it/   Medications reviewed and include solu-medrol , lovenox , and protonix .   Labs reviewed.    NUTRITION - FOCUSED PHYSICAL EXAM:  Flowsheet Row Most Recent Value  Orbital Region No depletion  Upper Arm Region Mild depletion  Thoracic and Lumbar Region No depletion  Buccal Region No depletion  Temple Region No depletion  Clavicle Bone Region No depletion  Clavicle and Acromion Bone Region No depletion  Scapular Bone Region No depletion  Dorsal Hand Mild depletion  Patellar Region No depletion  Anterior Thigh Region No depletion  Posterior Calf Region No depletion  Edema (RD Assessment) Mild  Hair Reviewed  Eyes Reviewed  Mouth Reviewed  Skin Reviewed  Nails Reviewed    Diet Order:   Diet Order             Diet regular Fluid consistency: Thin  Diet effective now                   EDUCATION NEEDS:   Education needs have been addressed  Skin:  Skin Assessment: Reviewed RN Assessment  Last BM:  08/25/24  Height:   Ht Readings from Last 1 Encounters:  08/25/24  5' 2 (1.575 m)    Weight:   Wt Readings from Last 1 Encounters:  08/26/24 55.1 kg    Ideal Body Weight:  50 kg  BMI:  Body mass index is 22.22 kg/m.  Estimated Nutritional Needs:   Kcal:  1400-1600  Protein:  65-80 grams  Fluid:  1.4-1.6 L    Margery ORN, RD, LDN, CDCES Registered Dietitian III Certified Diabetes Care and Education Specialist If unable to reach this RD, please use RD Inpatient group chat on secure chat between hours of 8am-4 pm daily

## 2024-08-26 NOTE — Evaluation (Signed)
 Physical Therapy Evaluation Patient Details Name: [name] MRN: [id number] DOB: [birth year] Today's Date: 08/26/2024  History of Present Illness  [name] is a 89yoF who comes to [facility] with several days fo respiratory distraess, hypoxia, cough weakness- now admitted for Covid PNA. Pt recently back from a trip to [place] with family. Baseline mobility includes limited community distance AMB [facility], no recent falls, ADL independece, family assists with IADL.  Clinical Impression  Pt is tired, and reports feeling weak, no doubt partially mediated by process of admission, however pt denies any frank pain or SOB. Unfortunately pt has no SOB when hypoxic and AMB, 84% on room air after return from bathroom. Pt demonstrates modified independent performance of bed mobility, transfers, and AMB in-room, albeit these activities are not performed with the level of gusto seen at baseline. Pt show no frank imbalance during my assessment, denies any acute loss of balance. DTR in room for visit. Recommended DC to home at DC, HHPT services, no DME needs. Will continue to follow.       If plan is discharge home, recommend the following: A little help with walking and/or transfers;A little help with bathing/dressing/bathroom;Assist for transportation;Help with stairs or ramp for entrance   Can travel by private vehicle        Equipment Recommendations None recommended by PT  Recommendations for Other Services       Functional Status Assessment Patient has had a recent decline in their functional status and demonstrates the ability to make significant improvements in function in a reasonable and predictable amount of time.     Precautions / Restrictions Precautions Precautions: Fall Restrictions Weight Bearing Restrictions Per Provider Order: No      Mobility  Bed Mobility Overal bed mobility: Modified Independent                  Transfers Overall transfer level: Modified  independent Equipment used: None, Straight cane                    Ambulation/Gait Ambulation/Gait assistance: Supervision Gait Distance (Feet): 25 Feet (twice for BR voiding) Assistive device: Straight cane Gait Pattern/deviations: WFL(Within Functional Limits)       General Gait Details: no frank LOB, no heightened falls anxiety  Stairs            Wheelchair Mobility     Tilt Bed    Modified Rankin (Stroke Patients Only)       Balance Overall balance assessment: Modified Independent                                           Pertinent Vitals/Pain Pain Assessment Pain Assessment: No/denies pain    Home Living Family/patient expects to be discharged to:: Private residence Living Arrangements: Alone Available Help at Discharge: Family;Available 24 hours/day (DTR lives close by (primary support), works full time) Type of Home: House Home Access: Stairs to enter Entrance Stairs-Rails: Lawyer of Steps: 2 Alternate Level Stairs-Number of Steps: bedroom on main level Home Layout: Multi-level;Able to live on main level with bedroom/bathroom Home Equipment: Cane - single point;Rolling Walker (2 wheels)      Prior Function Prior Level of Function : Independent/Modified Independent             Mobility Comments: typcially tolerates community distance AMB, jus tgo tback from trip to [place], not trouble  walking airport and there. ADLs Comments: bathes, dresses self; does not drive, no falls     Extremity/Trunk Assessment                Communication        Cognition Arousal: Alert Behavior During Therapy: WFL for tasks assessed/performed                           PT - Cognition Comments: HOH         Cueing       General Comments      Exercises     Assessment/Plan    PT Assessment Patient needs continued PT services  PT Problem List Cardiopulmonary status limiting  activity;Decreased activity tolerance;Decreased mobility       PT Treatment Interventions DME instruction;Gait training;Stair training;Functional mobility training;Therapeutic exercise;Patient/family education    PT Goals (Current goals can be found in the Care Plan section)  Acute Rehab PT Goals Patient Stated Goal: regain her confidence in mobility, achieve more mobility daily while admitted PT Goal Formulation: With patient Time For Goal Achievement: 09/09/24 Potential to Achieve Goals: Good    Frequency Min 3X/week     Co-evaluation               AM-PAC PT 6 Clicks Mobility  Outcome Measure Help needed turning from your back to your side while in a flat bed without using bedrails?: None Help needed moving from lying on your back to sitting on the side of a flat bed without using bedrails?: None Help needed moving to and from a bed to a chair (including a wheelchair)?: None Help needed standing up from a chair using your arms (e.g., wheelchair or bedside chair)?: None Help needed to walk in hospital room?: A Little Help needed climbing 3-5 steps with a railing? : A Little 6 Click Score: 22    End of Session Equipment Utilized During Treatment: Oxygen Activity Tolerance: Patient tolerated treatment well;Treatment limited secondary to medical complications (Comment) (hypoxoic on RA) Patient left: in chair;with family/visitor present;with call bell/phone within reach   PT Visit Diagnosis: Other abnormalities of gait and mobility (R26.89);Muscle weakness (generalized) (M62.81)    Time: 9143-9079 PT Time Calculation (min) (ACUTE ONLY): 24 min   Charges:   PT Evaluation $PT Eval Low Complexity: 1 Low PT Treatments $Therapeutic Activity: 8-22 mins PT General Charges $$ ACUTE PT VISIT: 1 Visit    9:54 AM, 08/26/24 [name], PT, DPT Physical Therapist - [facility]  [phone] (ASCOM)        Katrina Sheeley  Allen 08/26/2024, 9:50 AM

## 2024-08-27 LAB — COMPREHENSIVE METABOLIC PANEL WITH GFR
ALT: 251 U/L — ABNORMAL HIGH (ref 0–44)
AST: 170 U/L — ABNORMAL HIGH (ref 15–41)
Albumin: 3.3 g/dL — ABNORMAL LOW (ref 3.5–5.0)
Alkaline Phosphatase: 143 U/L — ABNORMAL HIGH (ref 38–126)
Anion gap: 11 (ref 5–15)
BUN: 51 mg/dL — ABNORMAL HIGH (ref 8–23)
CO2: 23 mmol/L (ref 22–32)
Calcium: 8.4 mg/dL — ABNORMAL LOW (ref 8.9–10.3)
Chloride: 104 mmol/L (ref 98–111)
Creatinine, Ser: 1.68 mg/dL — ABNORMAL HIGH (ref 0.44–1.00)
GFR, Estimated: 28 mL/min — ABNORMAL LOW (ref >60)
Glucose, Bld: 151 mg/dL — ABNORMAL HIGH (ref 70–99)
Potassium: 5.5 mmol/L — ABNORMAL HIGH (ref 3.5–5.1)
Sodium: 138 mmol/L (ref 135–145)
Total Bilirubin: 0.3 mg/dL (ref 0.0–1.2)
Total Protein: 6.1 g/dL — ABNORMAL LOW (ref 6.5–8.1)

## 2024-08-27 LAB — CBC WITH DIFFERENTIAL/PLATELET
Abs Immature Granulocytes: 0.08 K/uL — ABNORMAL HIGH (ref 0.00–0.07)
Basophils Absolute: 0 K/uL (ref 0.0–0.1)
Basophils Relative: 0 %
Eosinophils Absolute: 0 K/uL (ref 0.0–0.5)
Eosinophils Relative: 0 %
HCT: 34.6 % — ABNORMAL LOW (ref 36.0–46.0)
Hemoglobin: 11 g/dL — ABNORMAL LOW (ref 12.0–15.0)
Immature Granulocytes: 1 %
Lymphocytes Relative: 4 %
Lymphs Abs: 0.4 K/uL — ABNORMAL LOW (ref 0.7–4.0)
MCH: 33.4 pg (ref 26.0–34.0)
MCHC: 31.8 g/dL (ref 30.0–36.0)
MCV: 105.2 fL — ABNORMAL HIGH (ref 80.0–100.0)
Monocytes Absolute: 0.9 K/uL (ref 0.1–1.0)
Monocytes Relative: 8 %
Neutro Abs: 10.2 K/uL — ABNORMAL HIGH (ref 1.7–7.7)
Neutrophils Relative %: 87 %
Platelets: 165 K/uL (ref 150–400)
RBC: 3.29 MIL/uL — ABNORMAL LOW (ref 3.87–5.11)
RDW: 12.4 % (ref 11.5–15.5)
WBC: 11.6 K/uL — ABNORMAL HIGH (ref 4.0–10.5)
nRBC: 0 % (ref 0.0–0.2)

## 2024-08-27 MED ORDER — SODIUM ZIRCONIUM CYCLOSILICATE 5 G PO PACK
5.0000 g | PACK | Freq: Once | ORAL | Status: AC
  Administered 2024-08-27: 5 g via ORAL
  Filled 2024-08-27: qty 1

## 2024-08-27 MED ORDER — LOSARTAN POTASSIUM 50 MG PO TABS
100.0000 mg | ORAL_TABLET | Freq: Every day | ORAL | Status: DC
  Administered 2024-08-27 – 2024-09-02 (×7): 100 mg via ORAL
  Filled 2024-08-27 (×7): qty 2

## 2024-08-27 MED ORDER — DOCUSATE SODIUM 100 MG PO CAPS
100.0000 mg | ORAL_CAPSULE | Freq: Two times a day (BID) | ORAL | Status: DC
  Administered 2024-08-27 – 2024-09-02 (×11): 100 mg via ORAL
  Filled 2024-08-27 (×13): qty 1

## 2024-08-27 NOTE — Plan of Care (Signed)
  Problem: Coping: Goal: Psychosocial and spiritual needs will be supported Outcome: Progressing   Problem: Respiratory: Goal: Complications related to the disease process, condition or treatment will be avoided or minimized Outcome: Progressing   Problem: Clinical Measurements: Goal: Ability to maintain clinical measurements within normal limits will improve Outcome: Progressing   Problem: Activity: Goal: Risk for activity intolerance will decrease Outcome: Progressing   Problem: Pain Managment: Goal: General experience of comfort will improve and/or be controlled Outcome: Progressing

## 2024-08-27 NOTE — Plan of Care (Signed)
  Problem: Education: Goal: Knowledge of risk factors and measures for prevention of condition will improve Outcome: Progressing   Problem: Coping: Goal: Psychosocial and spiritual needs will be supported Outcome: Progressing   Problem: Respiratory: Goal: Will maintain a patent airway Outcome: Progressing Goal: Complications related to the disease process, condition or treatment will be avoided or minimized Outcome: Progressing   Problem: Education: Goal: Knowledge of General Education information will improve Description: Including pain rating scale, medication(s)/side effects and non-pharmacologic comfort measures Outcome: Progressing   Problem: Health Behavior/Discharge Planning: Goal: Ability to manage health-related needs will improve Outcome: Progressing   Problem: Clinical Measurements: Goal: Ability to maintain clinical measurements within normal limits will improve Outcome: Progressing Goal: Will remain free from infection Outcome: Progressing Goal: Diagnostic test results will improve Outcome: Progressing Goal: Respiratory complications will improve Outcome: Progressing Goal: Cardiovascular complication will be avoided Outcome: Progressing   Problem: Activity: Goal: Risk for activity intolerance will decrease Outcome: Progressing   Problem: Nutrition: Goal: Adequate nutrition will be maintained Outcome: Progressing   Problem: Coping: Goal: Level of anxiety will decrease Outcome: Progressing   Problem: Elimination: Goal: Will not experience complications related to bowel motility Outcome: Progressing Goal: Will not experience complications related to urinary retention Outcome: Progressing   Problem: Pain Managment: Goal: General experience of comfort will improve and/or be controlled Outcome: Progressing   Problem: Safety: Goal: Ability to remain free from injury will improve Outcome: Progressing   Problem: Skin Integrity: Goal: Risk for impaired  skin integrity will decrease Outcome: Progressing   Problem: Education: Goal: Knowledge of disease or condition will improve Outcome: Progressing Goal: Knowledge of the prescribed therapeutic regimen will improve Outcome: Progressing Goal: Individualized Educational Video(s) Outcome: Progressing   Problem: Activity: Goal: Ability to tolerate increased activity will improve Outcome: Progressing Goal: Will verbalize the importance of balancing activity with adequate rest periods Outcome: Progressing   Problem: Respiratory: Goal: Ability to maintain a clear airway will improve Outcome: Progressing Goal: Levels of oxygenation will improve Outcome: Progressing Goal: Ability to maintain adequate ventilation will improve Outcome: Progressing

## 2024-08-27 NOTE — Progress Notes (Signed)
Per Dr Griffith, dc tele monitoring  

## 2024-08-27 NOTE — Progress Notes (Addendum)
 PROGRESS NOTE    [name]  FMW:[id number] DOB: 13-Feb-1926 DOA: 08/25/2024 PCP: Marylynn Verneita CROME, MD   Brief Narrative:   [name] is a 89 y.o. female with medical history significant for CAD s/p CABG (1999), former smoker, COPD, HTN,,CKD lllb, anxiety, venous stasis on as needed Lasix , being admitted with COVID-positive RLL pneumonia/COPD exacerbation, and new O2 requirement of 3 L.  She presented with a 1 week history of a cough productive of green phlegm, associated with shortness of breath and wheezing  in the setting of a recent return from an overseas trip.  She had received a prescription for azithromycin  and prednisone  taper from her PCP a few days prior but symptoms did not improve.  She denies chest pain.  She denies lower extremity swelling worse than her baseline or lower extremity pain.   In the ED afebrile, pulse in the 60s to 80s, normal respirations but O2 as low as 84% on room air requiring 3 L to maintain sats in the high 90s, BP 153/72. WBC normal at 7.5 and respiratory viral panel positive for COVID.  D-dimer was elevated at 1.12 Creatinine 1.58 up from baseline of 1.35 a month ago. EKG showed sinus at 88 with PVCs Patient was unable to get CTA chest to evaluate for PE due to GFR being below cutoff.  CT chest without contrast showed findings suspicious for developing right lower lobe infiltrate as well as small right pleural effusion and scattered tree-in-bud nodules  Patient initiated on IV antibiotics.  Admitted for further management    Assessment & Plan:   Principal Problem:   CAP (community acquired pneumonia), right lower lobe Active Problems:   COVID-19 virus infection   COPD with acute exacerbation (HCC)   Acute respiratory failure with hypoxia (HCC)   Coronary artery disease s/p CABG   Chronic venous stasis   Stage 3b chronic kidney disease (HCC)   Essential (primary) hypertension   Anxiety disorder   * CAP (community acquired  pneumonia), right lower lobe COVID-positive COPD exacerbation Acute respiratory failure with hypoxia Patient presented with cough productive of green phlegm and shortness of breath, hypoxic to 84%.  COVID-19 positive, suspect additional bacterial infection given lobar infiltrate --Continue IV Rocephin  and azithromycin  --Bronchodilators  --IV steroids >> prednisone  --Antitussives and supportive care --Incentive spirometer & Flutter valve --Supplemental oxygen and wean as tolerated, currently 3 L/min. --Airborne precautions    Acute respiratory failure with hypoxia (HCC) Suspect driven by pneumonia, however VTE a consideration given elevated D-dimer, recent travel/cruise CTA could not be performed due to low GFR.  Await lower extremity Doppler. Discussed VQ scan with daughter at the bedside.  Patient is feeling better, and she would like to hold off on further testing at this time.  Hyperkalemia - K 5.5 this AM --5 g Lokelma   --Repeat BMP in AM  Transaminitis -- AST 170, ALT 251, alk phos 143, normal T bili.  Pt has no GI symptoms Suspect due to Covid infection. --Trend LFTs & consider further evaluation including RUQ U/S if not improving  Coronary artery disease s/p CABG No complaints of chest pain and EKG nonacute Patient follows with cardiologist regularly and has had atypical chest pain in the past --Continue home atorvastatin , Imdur , losartan , metoprolol , NTG as needed, propranolol  --?Plavix  shows on med history but not in Cardiologists most recent note from July   Chronic venous stasis No history of CHF PTA on as needed Lasix  by cardiology --Holding Lasix  with Cr slightly above baseline  Anxiety disorder --Continue mirtazapine    Essential (primary) hypertension Mildly elevated on admission 12/9: BPs intermittently elevated but overall stable --Continue home metoprolol  and losartan  --Home propranolol  and Lasix  are on hold --Titrate regimen   Stage 3b chronic  kidney disease (HCC) Essentially at baseline albeit with mild worsening.  Cr 1.55 >> 1.68 today --Monitor renal function --Avoid nephrotoxins and renally dose meds     DVT prophylaxis: Lovenox    Consults: none   Advance Care Planning:   Code Status: Prior    Family Communication: daughter at bedside on rounds this AM 12/9   Disposition Plan: Back to previous home environment   Subjective:  Patient up in recliner, daughter at bedside this AM.  Pt reports feeling somewhat better since admission.  She had just been in the [place] celebrating 89th birthday with her family and got sick traveling home.  No fever/chills.  No dyspnea at rest.  Confirms on on home O2 at baseline.     Objective: Vitals:   08/26/24 2017 08/27/24 0019 08/27/24 0449 08/27/24 0916  BP: (!) 151/66 (!) 136/115 (!) 160/71 137/72  Pulse: 71 84 70 71  Resp: 12 16 16 16   Temp: (!) 97.3 F (36.3 C) (!) 96.6 F (35.9 C) (!) 96.6 F (35.9 C) 97.7 F (36.5 C)  TempSrc:    Oral  SpO2: 100% 98% 100% 98%  Weight:      Height:        Intake/Output Summary (Last 24 hours) at 08/27/2024 1233 Last data filed at 08/27/2024 0600 Gross per 24 hour  Intake 830 ml  Output --  Net 830 ml   Filed Weights   08/25/24 2010 08/26/24 0633  Weight: 59.5 kg 55.1 kg    Examination:  General exam: awake, alert, no acute distress HEENT: moist mucus membranes, hearing grossly normal  Respiratory system: lungs overall diminished with intermittent wheezes, no rhonchi, normal respiratory effort at rest on 3 L/min [place] O2. Cardiovascular system: normal S1/S2, RRR, no pedal edema.   Gastrointestinal system: soft, NT, ND, no HSM felt, +bowel sounds. Central nervous system: A&O x 3. no gross focal neurologic deficits, normal speech Extremities: moves all, no edema, normal tone Skin: dry, intact, normal temperature Psychiatry: normal mood, congruent affect, judgement and insight appear normal     Data Reviewed: I have personally  reviewed following labs and imaging studies  CBC: Recent Labs  Lab 08/25/24 1210 08/26/24 0355 08/27/24 0551  WBC 7.5 7.2 11.6*  NEUTROABS  --   --  10.2*  HGB 12.1 10.3* 11.0*  HCT 38.3 33.1* 34.6*  MCV 107.0* 109.2* 105.2*  PLT 202 174 165   Basic Metabolic Panel: Recent Labs  Lab 08/25/24 1210 08/26/24 0355 08/27/24 0551  NA 141 141 138  K 4.6 4.9 5.5*  CL 102 107 104  CO2 27 22 23   GLUCOSE 135* 130* 151*  BUN 42* 44* 51*  CREATININE 1.58* 1.55* 1.68*  CALCIUM  9.0 8.1* 8.4*   GFR: Estimated Creatinine Clearance: 15.5 mL/min (A) (by C-G formula based on SCr of 1.68 mg/dL (H)). Liver Function Tests: Recent Labs  Lab 08/27/24 0551  AST 170*  ALT 251*  ALKPHOS 143*  BILITOT 0.3  PROT 6.1*  ALBUMIN 3.3*   No results for input(s): LIPASE, AMYLASE in the last 168 hours. No results for input(s): AMMONIA in the last 168 hours. Coagulation Profile: No results for input(s): INR, PROTIME in the last 168 hours. Cardiac Enzymes: No results for input(s): CKTOTAL, CKMB, CKMBINDEX, TROPONINI in the  last 168 hours. BNP (last 3 results) No results for input(s): PROBNP in the last 8760 hours. HbA1C: No results for input(s): HGBA1C in the last 72 hours. CBG: No results for input(s): GLUCAP in the last 168 hours. Lipid Profile: No results for input(s): CHOL, HDL, LDLCALC, TRIG, CHOLHDL, LDLDIRECT in the last 72 hours. Thyroid  Function Tests: No results for input(s): TSH, T4TOTAL, FREET4, T3FREE, THYROIDAB in the last 72 hours. Anemia Panel: No results for input(s): VITAMINB12, FOLATE, FERRITIN, TIBC, IRON, RETICCTPCT in the last 72 hours. Sepsis Labs: No results for input(s): PROCALCITON, LATICACIDVEN in the last 168 hours.  Recent Results (from the past 240 hours)  Resp panel by RT-PCR (RSV, Flu A&B, Covid) Anterior Nasal Swab     Status: Abnormal   Collection Time: 08/25/24  5:40 PM   Specimen: Anterior  Nasal Swab  Result Value Ref Range Status   SARS Coronavirus 2 by RT PCR POSITIVE (A) NEGATIVE Final    Comment: (NOTE) SARS-CoV-2 target nucleic acids are DETECTED.  The SARS-CoV-2 RNA is generally detectable in upper respiratory specimens during the acute phase of infection. Positive results are indicative of the presence of the identified virus, but do not rule out bacterial infection or co-infection with other pathogens not detected by the test. Clinical correlation with patient history and other diagnostic information is necessary to determine patient infection status. The expected result is Negative.  Fact Sheet for Patients: bloggercourse.com  Fact Sheet for Healthcare Providers: seriousbroker.it  This test is not yet approved or cleared by the [place]  FDA and  has been authorized for detection and/or diagnosis of SARS-CoV-2 by FDA under an Emergency Use Authorization (EUA).  This EUA will remain in effect (meaning this test can be used) for the duration of  the COVID-19 declaration under Section 564(b)(1) of the A ct, 21 U.S.C. section 360bbb-3(b)(1), unless the authorization is terminated or revoked sooner.     Influenza A by PCR NEGATIVE NEGATIVE Final   Influenza B by PCR NEGATIVE NEGATIVE Final    Comment: (NOTE) The Xpert Xpress SARS-CoV-2/FLU/RSV plus assay is intended as an aid in the diagnosis of influenza from Nasopharyngeal swab specimens and should not be used as a sole basis for treatment. Nasal washings and aspirates are unacceptable for Xpert Xpress SARS-CoV-2/FLU/RSV testing.  Fact Sheet for Patients: bloggercourse.com  Fact Sheet for Healthcare Providers: seriousbroker.it  This test is not yet approved or cleared by the [place]  FDA and has been authorized for detection and/or diagnosis of SARS-CoV-2 by FDA under an Emergency Use  Authorization (EUA). This EUA will remain in effect (meaning this test can be used) for the duration of the COVID-19 declaration under Section 564(b)(1) of the Act, 21 U.S.C. section 360bbb-3(b)(1), unless the authorization is terminated or revoked.     Resp Syncytial Virus by PCR NEGATIVE NEGATIVE Final    Comment: (NOTE) Fact Sheet for Patients: bloggercourse.com  Fact Sheet for Healthcare Providers: seriousbroker.it  This test is not yet approved or cleared by the [place]  FDA and has been authorized for detection and/or diagnosis of SARS-CoV-2 by FDA under an Emergency Use Authorization (EUA). This EUA will remain in effect (meaning this test can be used) for the duration of the COVID-19 declaration under Section 564(b)(1) of the Act, 21 U.S.C. section 360bbb-3(b)(1), unless the authorization is terminated or revoked.  Performed at [facility], [address], [place], [place] [address]          Radiology Studies: US  Venous Img Lower Bilateral (  DVT) Result Date: 08/25/2024 EXAM: ULTRASOUND DUPLEX OF THE BILATERAL LOWER EXTREMITY VEINS TECHNIQUE: Duplex ultrasound using B-mode/gray scaled imaging and Doppler spectral analysis and color flow was obtained of the deep venous structures of the bilateral lower extremity. COMPARISON: None available. CLINICAL HISTORY: Dyspnea on exertion. FINDINGS: LEFT: The common femoral vein, femoral vein, popliteal vein, and posterior tibial vein of the left lower extremity demonstrate normal compressibility with normal color flow and spectral analysis. RIGHT: The common femoral vein, femoral vein, popliteal vein, and posterior tibial vein of the right lower extremity demonstrate normal compressibility with normal color flow and spectral analysis. IMPRESSION: 1. No evidence of deep venous thrombosis in the bilateral lower extremities. Electronically signed by: Franky Crease MD 08/25/2024  08:42 PM EST RP Workstation: [id number]   CT Chest Wo Contrast Result Date: 08/25/2024 CLINICAL DATA:  Respiratory illness, nondiagnostic x-ray. Shortness of breath. EXAM: CT CHEST WITHOUT CONTRAST TECHNIQUE: Multidetector CT imaging of the chest was performed following the standard protocol without IV contrast. RADIATION DOSE REDUCTION: This exam was performed according to the departmental dose-optimization program which includes automated exposure control, adjustment of the mA and/or kV according to patient size and/or use of iterative reconstruction technique. COMPARISON:  None Available. FINDINGS: Cardiovascular: The heart is enlarged and there is no pericardial effusion. Multi-vessel coronary artery calcifications are noted. There is evidence of prior cardiothoracic surgery. There is atherosclerotic calcification of the aorta without evidence of aneurysm. The pulmonary trunk is normal in caliber. Mediastinum/Nodes: No mediastinal or axillary lymphadenopathy. Evaluation of the hila is limited due to lack of IV contrast. The thyroid  gland, trachea, and esophagus are within normal limits. There is a large hiatal hernia. Lungs/Pleura: A small pleural effusion is noted on the right. Pleural and parenchymal scarring are present at the lung apices bilaterally. A few scattered tree-in-bud nodules are noted in the right upper and lower lobes atelectasis is noted bilaterally. Bronchial wall thickening is noted bilaterally. A few airspace opacities are present in the right lower lobe. No pneumothorax is seen. Upper Abdomen: Diverticula are noted along the colon without evidence of diverticulitis. No acute abnormality is seen. Musculoskeletal: Sternotomy wires are noted. Degenerative changes are present in the thoracic spine. No acute or suspicious osseous abnormality is seen. IMPRESSION: 1. Patchy airspace disease in the right lower lobe, suspicious for developing infiltrate. 2. Small right pleural effusion. 3.  Scattered tree-in-bud nodules, likely infectious or inflammatory. 4. Large hiatal hernia. 5. Cardiomegaly with coronary artery calcifications. 6. Aortic atherosclerosis. Electronically Signed   By: Leita Birmingham M.D.   On: 08/25/2024 18:39   DG Chest 2 View Result Date: 08/25/2024 CLINICAL DATA:  Short of breath, weakness EXAM: CHEST - 2 VIEW COMPARISON:  07/08/2024 FINDINGS: Frontal and lateral views of the chest demonstrate postsurgical changes from CABG. The cardiac silhouette is unremarkable. There is chronic background scarring and emphysema, with small stable right pleural effusion or pleural thickening again noted. No acute airspace disease or pneumothorax. No acute bony abnormalities. IMPRESSION: 1. Stable chest, no acute process. Electronically Signed   By: Ozell Daring M.D.   On: 08/25/2024 13:18        Scheduled Meds:  atorvastatin   40 mg Oral Daily   budesonide   0.5 mg Nebulization BID   docusate sodium   100 mg Oral BID   enoxaparin  (LOVENOX ) injection  30 mg Subcutaneous Q24H   feeding supplement  237 mL Oral BID BM   ipratropium-albuterol   3 mL Nebulization BID   isosorbide  mononitrate  30 mg Oral BID  metoprolol  succinate  25 mg Oral Daily   mirtazapine   15 mg Oral QHS   multivitamin with minerals  1 tablet Oral Daily   pantoprazole   40 mg Oral Daily   predniSONE   40 mg Oral Q breakfast   ranolazine   500 mg Oral BID   senna-docusate  2 tablet Oral QHS   Continuous Infusions:  azithromycin  500 mg (08/27/24 1021)   cefTRIAXone  (ROCEPHIN )  IV 2 g (08/26/24 1558)          Burnard DELENA Cunning, DO [facility] 08/27/2024, 12:33 PM

## 2024-08-27 NOTE — Progress Notes (Signed)
 Physical Therapy Treatment Patient Details Name: [name] MRN: [id number] DOB: 1926/07/10 Today's Date: 08/27/2024   History of Present Illness Pt is a 89 y.o. female being admitted with COVID-positive RLL pneumonia/COPD exacerbation, and new O2 requirement of 3 L.  She presented with a 1 week history of a cough productive of green phlegm, associated with shortness of breath and wheezing  in the setting of a recent return from an overseas trip. PMH of CAD s/p CABG (1999), former smoker, COPD, HTN,,CKD lllb, anxiety, venous stasis on as needed Lasix .    PT Comments  Pt was seated in recliner with supportive daughter at bedside. She agrees to session and remains on 2 L o2. Vitals remain stable. Easily and safely able to stand and ambulate with SPC without LOB. Distance limited by Line. Will bring O2 tank next session. Dc recs remain appropriate.    If plan is discharge home, recommend the following: A little help with walking and/or transfers;A little help with bathing/dressing/bathroom;Assist for transportation;Help with stairs or ramp for entrance     Equipment Recommendations  None recommended by PT       Precautions / Restrictions Precautions Precautions: Fall Recall of Precautions/Restrictions: Intact Restrictions Weight Bearing Restrictions Per Provider Order: No     Mobility  Bed Mobility Overal bed mobility: Modified Independent   Transfers Overall transfer level: Modified independent Equipment used: Straight cane  General transfer comment: no physical assistance or cueing    Ambulation/Gait Ambulation/Gait assistance: Supervision Gait Distance (Feet): 50 Feet Assistive device: Straight cane Gait Pattern/deviations: WFL(Within Functional Limits) Gait velocity: decreased  General Gait Details: limited by line management and fatigue. Vital stable throughout    Balance Overall balance assessment: Modified Independent     Communication  Communication Communication: Impaired Factors Affecting Communication: Hearing impaired  Cognition Arousal: Alert Behavior During Therapy: WFL for tasks assessed/performed   PT - Cognitive impairments: No apparent impairments    PT - Cognition Comments: HOH/ difficult understanding due to inability to see lips moving in mask Following commands: Intact      Cueing Cueing Techniques: Verbal cues, Tactile cues         Pertinent Vitals/Pain Pain Assessment Pain Assessment: No/denies pain     PT Goals (current goals can now be found in the care plan section) Acute Rehab PT Goals Patient Stated Goal: go home when I feel better Progress towards PT goals: Progressing toward goals    Frequency    Min 3X/week       AM-PAC PT 6 Clicks Mobility   Outcome Measure  Help needed turning from your back to your side while in a flat bed without using bedrails?: None Help needed moving from lying on your back to sitting on the side of a flat bed without using bedrails?: None Help needed moving to and from a bed to a chair (including a wheelchair)?: None Help needed standing up from a chair using your arms (e.g., wheelchair or bedside chair)?: None Help needed to walk in hospital room?: A Little Help needed climbing 3-5 steps with a railing? : A Little 6 Click Score: 22    End of Session   Activity Tolerance: Patient tolerated treatment well;Treatment limited secondary to medical complications (Comment) Patient left: in chair;with family/visitor present;with call bell/phone within reach Nurse Communication: Mobility status PT Visit Diagnosis: Other abnormalities of gait and mobility (R26.89);Muscle weakness (generalized) (M62.81)     Time: 8849-8795 PT Time Calculation (min) (ACUTE ONLY): 14 min  Charges:    $  Gait Training: 8-22 mins PT General Charges $$ ACUTE PT VISIT: 1 Visit                    Rankin Essex PTA 08/27/24, 1:00 PM

## 2024-08-28 ENCOUNTER — Ambulatory Visit: Admitting: Internal Medicine

## 2024-08-28 DIAGNOSIS — I5033 Acute on chronic diastolic (congestive) heart failure: Secondary | ICD-10-CM

## 2024-08-28 LAB — COMPREHENSIVE METABOLIC PANEL WITH GFR
ALT: 203 U/L — ABNORMAL HIGH (ref 0–44)
AST: 79 U/L — ABNORMAL HIGH (ref 15–41)
Albumin: 3.3 g/dL — ABNORMAL LOW (ref 3.5–5.0)
Alkaline Phosphatase: 130 U/L — ABNORMAL HIGH (ref 38–126)
Anion gap: 9 (ref 5–15)
BUN: 50 mg/dL — ABNORMAL HIGH (ref 8–23)
CO2: 28 mmol/L (ref 22–32)
Calcium: 8.3 mg/dL — ABNORMAL LOW (ref 8.9–10.3)
Chloride: 105 mmol/L (ref 98–111)
Creatinine, Ser: 1.55 mg/dL — ABNORMAL HIGH (ref 0.44–1.00)
GFR, Estimated: 30 mL/min — ABNORMAL LOW (ref >60)
Glucose, Bld: 115 mg/dL — ABNORMAL HIGH (ref 70–99)
Potassium: 4.9 mmol/L (ref 3.5–5.1)
Sodium: 142 mmol/L (ref 135–145)
Total Bilirubin: 0.2 mg/dL (ref 0.0–1.2)
Total Protein: 5.6 g/dL — ABNORMAL LOW (ref 6.5–8.1)

## 2024-08-28 LAB — CBC
HCT: 35.1 % — ABNORMAL LOW (ref 36.0–46.0)
Hemoglobin: 10.8 g/dL — ABNORMAL LOW (ref 12.0–15.0)
MCH: 33.6 pg (ref 26.0–34.0)
MCHC: 30.8 g/dL (ref 30.0–36.0)
MCV: 109.3 fL — ABNORMAL HIGH (ref 80.0–100.0)
Platelets: 176 K/uL (ref 150–400)
RBC: 3.21 MIL/uL — ABNORMAL LOW (ref 3.87–5.11)
RDW: 12.4 % (ref 11.5–15.5)
WBC: 13.9 K/uL — ABNORMAL HIGH (ref 4.0–10.5)
nRBC: 0 % (ref 0.0–0.2)

## 2024-08-28 LAB — PROCALCITONIN: Procalcitonin: 0.11 ng/mL

## 2024-08-28 LAB — PRO BRAIN NATRIURETIC PEPTIDE: Pro Brain Natriuretic Peptide: 17651 pg/mL — ABNORMAL HIGH (ref <300.0)

## 2024-08-28 MED ORDER — FUROSEMIDE 10 MG/ML IJ SOLN
40.0000 mg | Freq: Once | INTRAMUSCULAR | Status: AC
  Administered 2024-08-28: 40 mg via INTRAVENOUS
  Filled 2024-08-28: qty 4

## 2024-08-28 MED ORDER — AZITHROMYCIN 500 MG PO TABS
500.0000 mg | ORAL_TABLET | Freq: Every day | ORAL | Status: AC
  Administered 2024-08-29 – 2024-08-30 (×2): 500 mg via ORAL
  Filled 2024-08-28 (×2): qty 1

## 2024-08-28 NOTE — Progress Notes (Signed)
 Progress Note   Patient: [name] DOB: July 18, 1926 DOA: 08/25/2024     2 DOS: the patient was seen and examined on 08/28/2024   Brief hospital course: [name] is a 89 y.o. female with medical history significant for CAD s/p CABG (1999), former smoker, COPD, HTN,,CKD lllb, anxiety, venous stasis on as needed Lasix , being admitted with COVID-positive RLL pneumonia/COPD exacerbation, and new O2 requirement of 3 L.  Upon arrival in the ED, oxygen saturation was 84% on room air. CT chest without contrast showed right lower lobe infiltrates as well as small right pleural effusion with scattered tree-in-bud nodules. Patient was placed on steroids and antibiotics.   Principal Problem:   CAP (community acquired pneumonia), right lower lobe Active Problems:   COVID-19 virus infection   COPD with acute exacerbation (HCC)   Acute respiratory failure with hypoxia (HCC)   Coronary artery disease s/p CABG   Chronic venous stasis   Stage 3b chronic kidney disease (HCC)   Essential (primary) hypertension   Anxiety disorder   Assessment and Plan:  * CAP (community acquired pneumonia), right lower lobe COVID-positive COPD exacerbation Acute respiratory failure with hypoxia Patient presented with cough productive of green phlegm and shortness of breath, hypoxic to 84%.  COVID-19 positive, suspect additional bacterial infection given lobar infiltrate --Continue IV Rocephin  and azithromycin , procalcitonin level only 0.11, will consider change to oral antibiotic tomorrow. --Bronchodilators  --IV steroids >> prednisone  Condition appear to be improving.  Acute on chronic diastolic congestive heart failure.  Coronary artery disease s/p CABG Patient short of breath could be secondary to congestive heart acute exacerbation, reviewed patient echocardiogram performed in 01/2024 ejection fraction 50 to 55% with grade 2 diastolic dysfunction. However, patient proBNP level was  17,000.  Given chronic kidney disease, will give a dose of IV Lasix , will continue IV Lasix  tomorrow if renal function does not getting much worse.   Hyperkalemia  Chronic kidney disease stage IIIb. Potassium has normalized, renal function still stable.  Transaminitis -- AST 170, ALT 251, alk phos 143, normal T bili.  Pt has no GI symptoms Suspect due to Covid infection. --Trend LFTs & consider further evaluation including RUQ U/S if not improving s on med history but not in Cardiologists most recent note from July     Anxiety disorder --Continue mirtazapine    Essential (primary) hypertension Blood pressure medicine on hold     Subjective:  Patient still has significant short of breath with exertion, cough, nonproductive.  Physical Exam: Vitals:   08/27/24 0916 08/27/24 1655 08/27/24 1939 08/28/24 0814  BP: 137/72 (!) 145/81 (!) 143/69 (!) 158/67  Pulse: 71 80 100 69  Resp: 16 20 16 16   Temp: 97.7 F (36.5 C) 97.9 F (36.6 C) 97.7 F (36.5 C) (!) 96.2 F (35.7 C)  TempSrc: Oral Oral Oral   SpO2: 98% 97% 96% 97%  Weight:      Height:       General exam: Appears calm and comfortable  Respiratory system: Crackles in the base. Respiratory effort normal. Cardiovascular system: S1 & S2 heard, RRR. No JVD, murmurs, rubs, gallops or clicks. No pedal edema. Gastrointestinal system: Abdomen is nondistended, soft and nontender. No organomegaly or masses felt. Normal bowel sounds heard. Central nervous system: Alert and oriented x2. No focal neurological deficits. Extremities: 2+ leg edema Skin: No rashes, lesions or ulcers Psychiatry: Judgement and insight appear normal. Mood & affect appropriate.    Data Reviewed:  CT scan result and lab results  reviewed.  Family Communication: Daughter updated at the bedside.  Disposition: Status is: Inpatient Remains inpatient appropriate because: Severity of disease, IV treatment.     Time spent: 50 minutes  Author: Murvin Mana, MD 08/28/2024 3:54 PM  For on call review [email or web address].

## 2024-08-28 NOTE — Care Management Important Message (Signed)
 Important Message  Patient Details  Name: [name] MRN: [id number] Date of Birth: March 31, 1926   Important Message Given:  Yes - Medicare IM     Lynnel Zanetti W, CMA 08/28/2024, 12:32 PM

## 2024-08-28 NOTE — Plan of Care (Signed)
  Problem: Education: Goal: Knowledge of risk factors and measures for prevention of condition will improve Outcome: Progressing   Problem: Coping: Goal: Psychosocial and spiritual needs will be supported Outcome: Progressing   Problem: Respiratory: Goal: Will maintain a patent airway Outcome: Progressing Goal: Complications related to the disease process, condition or treatment will be avoided or minimized Outcome: Progressing   Problem: Education: Goal: Knowledge of General Education information will improve Description: Including pain rating scale, medication(s)/side effects and non-pharmacologic comfort measures Outcome: Progressing   Problem: Health Behavior/Discharge Planning: Goal: Ability to manage health-related needs will improve Outcome: Progressing   Problem: Clinical Measurements: Goal: Ability to maintain clinical measurements within normal limits will improve Outcome: Progressing Goal: Will remain free from infection Outcome: Progressing Goal: Diagnostic test results will improve Outcome: Progressing Goal: Respiratory complications will improve Outcome: Progressing Goal: Cardiovascular complication will be avoided Outcome: Progressing   Problem: Activity: Goal: Risk for activity intolerance will decrease Outcome: Progressing   Problem: Nutrition: Goal: Adequate nutrition will be maintained Outcome: Progressing   Problem: Coping: Goal: Level of anxiety will decrease Outcome: Progressing   Problem: Elimination: Goal: Will not experience complications related to bowel motility Outcome: Progressing Goal: Will not experience complications related to urinary retention Outcome: Progressing   Problem: Pain Managment: Goal: General experience of comfort will improve and/or be controlled Outcome: Progressing   Problem: Safety: Goal: Ability to remain free from injury will improve Outcome: Progressing   Problem: Skin Integrity: Goal: Risk for impaired  skin integrity will decrease Outcome: Progressing   Problem: Education: Goal: Knowledge of disease or condition will improve Outcome: Progressing Goal: Knowledge of the prescribed therapeutic regimen will improve Outcome: Progressing Goal: Individualized Educational Video(s) Outcome: Progressing   Problem: Activity: Goal: Ability to tolerate increased activity will improve Outcome: Progressing Goal: Will verbalize the importance of balancing activity with adequate rest periods Outcome: Progressing   Problem: Respiratory: Goal: Ability to maintain a clear airway will improve Outcome: Progressing Goal: Levels of oxygenation will improve Outcome: Progressing Goal: Ability to maintain adequate ventilation will improve Outcome: Progressing

## 2024-08-28 NOTE — Progress Notes (Signed)
 OT Cancellation Note  Patient Details Name: [name] MRN: [id number] DOB: 01/06/1926   Cancelled Treatment:    Reason Eval/Treat Not Completed: Other (comment). Pt received seated in recliner. Daughter in room states pt has been ambulating and performing ADLs with light assist this morning, requests that pt rest at this time and that OT sign off as pt does not have therapy needs. OT to complete orders, please re-consult as new needs arise.   Ritta Hammes L. Ryon Layton, OTR/L  08/28/24, 10:30 AM

## 2024-08-28 NOTE — Hospital Course (Addendum)
 [name] is a 89 y.o. female with medical history significant for CAD s/p CABG (1999), former smoker, COPD, HTN,,CKD lllb, anxiety, venous stasis on as needed Lasix , being admitted with COVID-positive RLL pneumonia/COPD exacerbation, and new O2 requirement of 3 L.  Upon arrival in the ED, oxygen saturation was 84% on room air. CT chest without contrast showed right lower lobe infiltrates as well as small right pleural effusion with scattered tree-in-bud nodules. Patient was placed on steroids and antibiotics. Patient also had a proBNP of 17,000, has acute on chronic diastolic congestive heart failure.  Started on IV Lasix  on 12/10. Condition finally improved, off oxygen, short of breath improved.  Leg edema resolved.  Patient is medically stable for discharge.

## 2024-08-28 NOTE — TOC Initial Note (Signed)
 Transition of Care [facility]) - Initial/Assessment Note    Patient Details  Name: [name] MRN: [id number] Date of Birth: [birth year]  [facility]) CM/SW Contact:    Nathanael CHRISTELLA Ring, RN Phone Number: 08/28/2024, 10:30 AM  Clinical Narrative:                 CM met with patient and her daughter at the bedside, patient is sleeping sitting up in the bedside recliner.  Daughter reports that this is the first time she has really gotten any rest.  Acute O2 [place] 3L.  Daughter says that patient does not have oxygen at home.  She lives at home alone and is independent walking with a cane.  Daughter provides transportation.  PCP is Dr. Marylynn, pharmacy is [facility].  Daughter agrees to home health at discharge will send out home health referrals.  She has also arranged for a caregiver to come and stay with patient once discharged.  TOC wil follow for additional needs, may need O2 set up at DC.   Expected Discharge Plan: Home w Home Health Services Barriers to Discharge: Continued Medical Work up   Patient Goals and CMS Choice Patient states their goals for this hospitalization and ongoing recovery are:: to get back home CMS Medicare.gov Compare Post Acute Care list provided to:: Patient Represenative (must comment) Choice offered to / list presented to : Adult Children      Expected Discharge Plan and Services   Discharge Planning Services: CM Consult Post Acute Care Choice: Home Health Living arrangements for the past 2 months: Single Family Home                           HH Arranged: PT, OT          Prior Living Arrangements/Services Living arrangements for the past 2 months: Single Family Home Lives with:: Self Patient language and need for interpreter reviewed:: Yes Do you feel safe going back to the place where you live?: Yes      Need for Family Participation in Patient Care: Yes (Comment) Care giver support system in place?: Yes (comment) Current home  services: DME (cane, walker) Criminal Activity/Legal Involvement Pertinent to Current Situation/Hospitalization: No - Comment as needed  Activities of Daily Living   ADL Screening (condition at time of admission) Independently performs ADLs?: Yes (appropriate for developmental age) Is the patient deaf or have difficulty hearing?: Yes Does the patient have difficulty seeing, even when wearing glasses/contacts?: No Does the patient have difficulty concentrating, remembering, or making decisions?: No  Permission Sought/Granted Permission sought to share information with : Case Manager, Other (comment) Permission granted to share information with : Yes, Verbal Permission Granted  Share Information with NAME: [name]  Permission granted to share info w AGENCY: Home Health Agencies  Permission granted to share info w Relationship: daughter  Permission granted to share info w Contact Information: [phone]  Emotional Assessment Appearance:: Appears stated age       Alcohol / Substance Use: Not Applicable Psych Involvement: No (comment)  Admission diagnosis:  CAP (community acquired pneumonia) [J18.9] Community acquired pneumonia of right lower lobe of lung [J18.9] Acute hypoxemic respiratory failure due to COVID-19 (HCC) [U07.1, J96.01] Patient Active Problem List   Diagnosis Date Noted   COVID-19 virus infection 08/25/2024   Chronic venous stasis 08/25/2024   Exudative age-related macular degeneration, left eye, with active choroidal neovascularization (HCC) 07/15/2024   Atrial fibrillation, unspecified  type (HCC) 07/15/2024   CKD (chronic kidney disease) stage 4, GFR 15-29 ml/min (HCC) 07/08/2024   Concern about appearance of breast 05/30/2024   Hip mass, right 05/30/2024   Right hip pain 12/27/2023   Urinary frequency 12/21/2023   History of recent fall 11/28/2023   Failure to thrive in adult 08/25/2022   Flatulence 08/25/2022   Venous insufficiency of both lower  extremities 01/10/2022   Hiatal hernia 01/03/2022   Diverticulosis 01/03/2022   Aortic atherosclerosis 01/03/2022   Generalized muscle weakness 12/09/2021   Arrhythmia 12/08/2021   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   Vascular dementia with anxiety (HCC) 10/12/2021   Hyperparathyroidism due to renal insufficiency 02/02/2021   History of esophageal stricture 04/08/2020   Leg swelling 03/09/2020   Atypical chest pain 01/10/2020   Acute respiratory failure with hypoxia (HCC) 01/10/2020   B12 deficiency 01/09/2020   Mild malnutrition 03/25/2019   Diastolic CHF, chronic (HCC) 03/25/2019   Vitamin D  deficiency 03/25/2019   S/P excision of skin lesion, follow-up exam 12/26/2017   CAP (community acquired pneumonia), right lower lobe 09/07/2017   Impingement syndrome of left shoulder region 07/04/2017   Allergic rhinitis 12/31/2016   COPD with acute exacerbation (HCC) 12/21/2016   Constipation 08/20/2016   Osteoporosis of forearm 02/13/2016   Insomnia secondary to anxiety 07/12/2015   Anxiety disorder 07/12/2015   Essential (primary) hypertension 10/07/2014   Hardening of the aorta (main artery of the heart) 06/26/2014   History of Clostridium difficile colitis 10/20/2013   History of pancreatitis 10/20/2013   Low back pain 10/04/2013   Do not resuscitate discussion 02/07/2013   Chest pain at rest 12/26/2011   Presence of aortocoronary bypass graft    Stage 3b chronic kidney disease (HCC) 09/25/2011   Hyperlipidemia    Dyspnea on exertion    Dizziness    Coronary artery disease s/p CABG    Carotid artery disease    PCP:  Marylynn Verneita CROME, MD Pharmacy:   [facility] - [place], [place] - [address] [place] [address] Phone: [phone] Fax: [phone]     Social Drivers of Health (SDOH) Social History: SDOH Screenings   Food Insecurity: Patient Declined (08/26/2024)  Housing: Patient Declined (08/26/2024)  Transportation Needs: Patient  Declined (08/26/2024)  Utilities: Patient Declined (08/26/2024)  Alcohol Screen: Low Risk  (02/21/2024)  Depression (PHQ2-9): Low Risk  (07/15/2024)  Financial Resource Strain: Low Risk  (02/21/2024)  Physical Activity: Inactive (02/21/2024)  Social Connections: Patient Declined (08/26/2024)  Stress: No Stress Concern Present (02/21/2024)  Tobacco Use: Medium Risk (08/25/2024)  Health Literacy: Inadequate Health Literacy (02/21/2024)   SDOH Interventions:     Readmission Risk Interventions     No data to display

## 2024-08-28 NOTE — Progress Notes (Signed)
 Mobility Specialist - Progress Note   08/28/24 1700  Mobility  Activity Ambulated with assistance  Level of Assistance Independent after set-up  Assistive Device Front wheel walker  Distance Ambulated (ft) 60 ft  Range of Motion/Exercises Active;All extremities  Activity Response Tolerated well  Mobility visit 1 Mobility  Mobility Specialist Start Time (ACUTE ONLY) 1648  Mobility Specialist Stop Time (ACUTE ONLY) 1700  Mobility Specialist Time Calculation (min) (ACUTE ONLY) 12 min   Pt was in the room with guest on RA upon entry. Pt agreed to mobility. Pt is able to STS independently with 2WW. Pt ambulated well. Pt did not need a recovery break throughout activity. After activity pt returned to the room with needs in reach and guest in the room upon exit.  Clem Rodes Mobility Specialist 08/28/24, 5:13 PM

## 2024-08-29 ENCOUNTER — Other Ambulatory Visit (HOSPITAL_COMMUNITY): Payer: Self-pay

## 2024-08-29 ENCOUNTER — Telehealth (HOSPITAL_COMMUNITY): Payer: Self-pay | Admitting: Pharmacy Technician

## 2024-08-29 LAB — BASIC METABOLIC PANEL WITH GFR
Anion gap: 7 (ref 5–15)
BUN: 48 mg/dL — ABNORMAL HIGH (ref 8–23)
CO2: 29 mmol/L (ref 22–32)
Calcium: 8.8 mg/dL — ABNORMAL LOW (ref 8.9–10.3)
Chloride: 106 mmol/L (ref 98–111)
Creatinine, Ser: 1.53 mg/dL — ABNORMAL HIGH (ref 0.44–1.00)
GFR, Estimated: 31 mL/min — ABNORMAL LOW (ref >60)
Glucose, Bld: 105 mg/dL — ABNORMAL HIGH (ref 70–99)
Potassium: 5.2 mmol/L — ABNORMAL HIGH (ref 3.5–5.1)
Sodium: 143 mmol/L (ref 135–145)

## 2024-08-29 LAB — MAGNESIUM: Magnesium: 2.6 mg/dL — ABNORMAL HIGH (ref 1.7–2.4)

## 2024-08-29 MED ORDER — SODIUM ZIRCONIUM CYCLOSILICATE 5 G PO PACK
5.0000 g | PACK | Freq: Once | ORAL | Status: AC
  Administered 2024-08-29: 5 g via ORAL
  Filled 2024-08-29: qty 1

## 2024-08-29 MED ORDER — AMOXICILLIN-POT CLAVULANATE 500-125 MG PO TABS
1.0000 | ORAL_TABLET | Freq: Two times a day (BID) | ORAL | Status: DC
  Administered 2024-08-29 – 2024-09-01 (×7): 1 via ORAL
  Filled 2024-08-29 (×7): qty 1

## 2024-08-29 MED ORDER — QUETIAPINE FUMARATE 25 MG PO TABS
25.0000 mg | ORAL_TABLET | Freq: Every day | ORAL | Status: DC
  Administered 2024-08-29 – 2024-09-01 (×4): 25 mg via ORAL
  Filled 2024-08-29 (×4): qty 1

## 2024-08-29 MED ORDER — FLUTICASONE FUROATE-VILANTEROL 200-25 MCG/ACT IN AEPB
1.0000 | INHALATION_SPRAY | Freq: Every day | RESPIRATORY_TRACT | Status: DC
  Administered 2024-08-29 – 2024-09-02 (×5): 1 via RESPIRATORY_TRACT
  Filled 2024-08-29: qty 28

## 2024-08-29 MED ORDER — FUROSEMIDE 10 MG/ML IJ SOLN
40.0000 mg | Freq: Two times a day (BID) | INTRAMUSCULAR | Status: DC
  Administered 2024-08-29 – 2024-08-31 (×6): 40 mg via INTRAVENOUS
  Filled 2024-08-29 (×6): qty 4

## 2024-08-29 NOTE — TOC Progression Note (Signed)
 Transition of Care [facility]) - Progression Note    Patient Details  Name: [name] MRN: [id number] Date of Birth: 1926/03/28  Transition [facility] Care [facility]) CM/SW Contact  Dalia GORMAN Fuse, RN Phone Number: 08/29/2024, 10:38 AM  Clinical Narrative:     Patient with no preference for [facility]. [facility] was selected bc they were the first to accept the patient. Satting 97% on 3 L [place], will likely need Home O2.  TOC will continue to follow for discharge planning.   Expected Discharge Plan: Home w Home Health Services Barriers to Discharge: Continued Medical Work up               Expected Discharge Plan and Services   Discharge Planning Services: CM Consult Post Acute Care Choice: Home Health Living arrangements for the past 2 months: Single Family Home                           HH Arranged: PT, OT           Social Drivers of Health (SDOH) Interventions SDOH Screenings   Food Insecurity: Patient Declined (08/26/2024)  Housing: Patient Declined (08/26/2024)  Transportation Needs: Patient Declined (08/26/2024)  Utilities: Patient Declined (08/26/2024)  Alcohol Screen: Low Risk (02/21/2024)  Depression (PHQ2-9): Low Risk (07/15/2024)  Financial Resource Strain: Low Risk (02/21/2024)  Physical Activity: Inactive (02/21/2024)  Social Connections: Patient Declined (08/26/2024)  Stress: No Stress Concern Present (02/21/2024)  Tobacco Use: Medium Risk (08/25/2024)  Health Literacy: Inadequate Health Literacy (02/21/2024)    Readmission Risk Interventions     No data to display

## 2024-08-29 NOTE — Plan of Care (Signed)
  Problem: Education: Goal: Knowledge of risk factors and measures for prevention of condition will improve Outcome: Progressing   Problem: Coping: Goal: Psychosocial and spiritual needs will be supported Outcome: Progressing   Problem: Respiratory: Goal: Will maintain a patent airway Outcome: Progressing Goal: Complications related to the disease process, condition or treatment will be avoided or minimized Outcome: Progressing   Problem: Education: Goal: Knowledge of General Education information will improve Description: Including pain rating scale, medication(s)/side effects and non-pharmacologic comfort measures Outcome: Progressing   Problem: Health Behavior/Discharge Planning: Goal: Ability to manage health-related needs will improve Outcome: Progressing   Problem: Clinical Measurements: Goal: Ability to maintain clinical measurements within normal limits will improve Outcome: Progressing Goal: Will remain free from infection Outcome: Progressing Goal: Diagnostic test results will improve Outcome: Progressing Goal: Respiratory complications will improve Outcome: Progressing Goal: Cardiovascular complication will be avoided Outcome: Progressing   Problem: Activity: Goal: Risk for activity intolerance will decrease Outcome: Progressing   Problem: Nutrition: Goal: Adequate nutrition will be maintained Outcome: Progressing   Problem: Coping: Goal: Level of anxiety will decrease Outcome: Progressing   Problem: Elimination: Goal: Will not experience complications related to bowel motility Outcome: Progressing Goal: Will not experience complications related to urinary retention Outcome: Progressing   Problem: Pain Managment: Goal: General experience of comfort will improve and/or be controlled Outcome: Progressing   Problem: Safety: Goal: Ability to remain free from injury will improve Outcome: Progressing   Problem: Skin Integrity: Goal: Risk for impaired  skin integrity will decrease Outcome: Progressing   Problem: Education: Goal: Knowledge of disease or condition will improve Outcome: Progressing Goal: Knowledge of the prescribed therapeutic regimen will improve Outcome: Progressing Goal: Individualized Educational Video(s) Outcome: Progressing   Problem: Activity: Goal: Ability to tolerate increased activity will improve Outcome: Progressing Goal: Will verbalize the importance of balancing activity with adequate rest periods Outcome: Progressing   Problem: Respiratory: Goal: Ability to maintain a clear airway will improve Outcome: Progressing Goal: Levels of oxygenation will improve Outcome: Progressing Goal: Ability to maintain adequate ventilation will improve Outcome: Progressing

## 2024-08-29 NOTE — Telephone Encounter (Signed)
 Patient Product/process Development Scientist completed.    The patient is insured through [employer]/ [employer]. Patient has Medicare and is not eligible for a copay card, but may be able to apply for patient assistance or Medicare RX Payment Plan (Patient Must reach out to their plan, if eligible for payment plan), if available.    Ran test claim for Breo Ellipta 200 and the current 30 day co-pay is $47.00.   This test claim was processed through [facility]- copay amounts may vary at other pharmacies due to pharmacy/plan contracts, or as the patient moves through the different stages of their insurance plan.     Reyes Sharps, CPHT Pharmacy Technician Patient Advocate Specialist Lead [facility] Patient Advocate Team Direct Number: [phone]  Fax: [phone]

## 2024-08-29 NOTE — Progress Notes (Signed)
 Nutrition Follow-up  DOCUMENTATION CODES:   Not applicable  INTERVENTION:   -Continue regular diet -Continue MVI with minerals daily -Continue Ensure Plus High Protein po BID, each supplement provides 350 kcal and 20 grams of protein   NUTRITION DIAGNOSIS:   Increased nutrient needs related to chronic illness (COPD) as evidenced by estimated needs.  Ongoing  GOAL:   Patient will meet greater than or equal to 90% of their needs  Progressing   MONITOR:   PO intake, Supplement acceptance  REASON FOR ASSESSMENT:   Consult Assessment of nutrition requirement/status  ASSESSMENT:   89 y.o. female with medical history significant for CAD s/p CABG (1999), former smoker, COPD, HTN,,CKD lllb, anxiety, venous stasis on as needed Lasix , being admitted with COVID-positive RLL pneumonia/COPD exacerbation, and new O2 requirement of 3 L.  She presented with a 1 week history of a cough productive of green phlegm, associated with shortness of breath and wheezing  in the setting of a recent [place] from an [place] trip.  She had received a prescription for azithromycin  and prednisone  taper from her PCP a few days prior but symptoms did not improve.  Reviewed I/O's: +120 ml x 24 hours and +1.1 L since admission   Patient remains on a regular diet. She is drinking Ensure supplements  No new weight since last visit.   Medications reviewed and include colace, lasix , protonix , prednisone , senokot, and lokelma .   Per TOC notes, plan for home health services at discharge.   Labs reviewed: K: 5.2. Mg: 2.6.    Diet Order:   Diet Order             Diet regular Fluid consistency: Thin  Diet effective now                   EDUCATION NEEDS:   Education needs have been addressed  Skin:  Skin Assessment: Reviewed RN Assessment  Last BM:  08/25/24  Height:   Ht Readings from Last 1 Encounters:  08/25/24 5' 2 (1.575 m)    Weight:   Wt Readings from Last 1 Encounters:   08/26/24 55.1 kg    Ideal Body Weight:  50 kg  BMI:  Body mass index is 22.22 kg/m.  Estimated Nutritional Needs:   Kcal:  1400-1600  Protein:  65-80 grams  Fluid:  1.4-1.6 L    Margery ORN, RD, LDN, CDCES Registered Dietitian III Certified Diabetes Care and Education Specialist If unable to reach this RD, please use RD Inpatient group chat on secure chat between hours of 8am-4 pm daily

## 2024-08-29 NOTE — Progress Notes (Signed)
 Progress Note   Patient: [name] FMW:[id number] DOB: 1926/01/26 DOA: 08/25/2024     3 DOS: the patient was seen and examined on 08/29/2024   Brief hospital course: [name] is a 89 y.o. female with medical history significant for CAD s/p CABG (1999), former smoker, COPD, HTN,,CKD lllb, anxiety, venous stasis on as needed Lasix , being admitted with COVID-positive RLL pneumonia/COPD exacerbation, and new O2 requirement of 3 L.  Upon arrival in the ED, oxygen saturation was 84% on room air. CT chest without contrast showed right lower lobe infiltrates as well as small right pleural effusion with scattered tree-in-bud nodules. Patient was placed on steroids and antibiotics. Patient also had a proBNP of 17,000, has acute on chronic diastolic congestive heart failure.  Started on IV Lasix  on 12/10.   Principal Problem:   CAP (community acquired pneumonia), right lower lobe Active Problems:   COVID-19 virus infection   COPD with acute exacerbation (HCC)   Acute respiratory failure with hypoxia (HCC)   Coronary artery disease s/p CABG   Chronic venous stasis   Stage 3b chronic kidney disease (HCC)   Essential (primary) hypertension   Anxiety disorder   Acute on chronic diastolic CHF (congestive heart failure) (HCC)   Assessment and Plan: * CAP (community acquired pneumonia), right lower lobe COVID-positive COPD exacerbation Acute respiratory failure with hypoxia Patient presented with cough productive of green phlegm and shortness of breath, hypoxic to 84%.  COVID-19 positive, suspect additional bacterial infection given lobar infiltrate --Continue IV Rocephin  and azithromycin , procalcitonin level only 0.11, antibiotic changed to Augmentin  and oral Zithromax  on 12/11. Continue prednisone  and bronchodilator.  Also added long-acting bronchodilator.   Acute on chronic diastolic congestive heart failure.  Coronary artery disease s/p CABG Patient short of breath could be  secondary to congestive heart acute exacerbation, reviewed patient echocardiogram performed in 01/2024 ejection fraction 50 to 55% with grade 2 diastolic dysfunction. However, patient proBNP level was 17,000.  She was given 1 dose IV Lasix , renal functions is not getting worse.  Will start 40 mg IV twice a day.  Continue monitor electrolytes.   Hyperkalemia  Chronic kidney disease stage IIIb. Potassium running high again, give a dose of Lokelma  again.  Renal function stable on Lasix .   Transaminitis -- AST 170, ALT 251, alk phos 143, normal T bili.  Pt has no GI symptoms Suspect due to Covid infection. --Trend LFTs & consider further evaluation including RUQ U/S if not improving s on med history but not in Cardiologists most recent note from July     Anxiety disorder --Continue mirtazapine    Essential (primary) hypertension Blood pressure medicine on hold            Subjective:  Patient could not sleep last night, feels very tired today.  Still short of breath, still has a cough, nonproductive.  Physical Exam: Vitals:   08/28/24 0814 08/28/24 1939 08/29/24 0746 08/29/24 0751  BP: (!) 158/67 (!) 139/54 (!) 185/77   Pulse: 69 71 76   Resp: 16 17    Temp: (!) 96.2 F (35.7 C) 98.4 F (36.9 C) 97.6 F (36.4 C)   TempSrc:  Oral    SpO2: 97% 98% 100% 97%  Weight:      Height:       General exam: Appears calm and comfortable  Respiratory system: Crackles in the bases. Respiratory effort normal. Cardiovascular system: S1 & S2 heard, RRR. No JVD, murmurs, rubs, gallops or clicks.  Gastrointestinal system: Abdomen is  nondistended, soft and nontender. No organomegaly or masses felt. Normal bowel sounds heard. Central nervous system: Alert and oriented. No focal neurological deficits. Extremities: 2+ leg edema Skin: No rashes, lesions or ulcers Psychiatry: Judgement and insight appear normal. Mood & affect appropriate.    Data Reviewed:  Lab results reviewed.  Family  Communication: Daughter updated at bedside.  Disposition: Status is: Inpatient Remains inpatient appropriate because: Severity of disease, IV treatment.     Time spent: 50 minutes  Author: Murvin Mana, MD 08/29/2024 1:28 PM  For on call review [email or web address].

## 2024-08-30 DIAGNOSIS — J441 Chronic obstructive pulmonary disease with (acute) exacerbation: Secondary | ICD-10-CM

## 2024-08-30 LAB — BASIC METABOLIC PANEL WITH GFR
Anion gap: 8 (ref 5–15)
BUN: 50 mg/dL — ABNORMAL HIGH (ref 8–23)
CO2: 31 mmol/L (ref 22–32)
Calcium: 8.4 mg/dL — ABNORMAL LOW (ref 8.9–10.3)
Chloride: 104 mmol/L (ref 98–111)
Creatinine, Ser: 1.49 mg/dL — ABNORMAL HIGH (ref 0.44–1.00)
GFR, Estimated: 32 mL/min — ABNORMAL LOW (ref >60)
Glucose, Bld: 96 mg/dL (ref 70–99)
Potassium: 4.3 mmol/L (ref 3.5–5.1)
Sodium: 143 mmol/L (ref 135–145)

## 2024-08-30 LAB — MAGNESIUM: Magnesium: 2.6 mg/dL — ABNORMAL HIGH (ref 1.7–2.4)

## 2024-08-30 MED ORDER — LACTULOSE 10 GM/15ML PO SOLN
20.0000 g | Freq: Once | ORAL | Status: AC
  Administered 2024-08-30: 20 g via ORAL
  Filled 2024-08-30: qty 30

## 2024-08-30 MED ORDER — DEXAMETHASONE 4 MG PO TABS
6.0000 mg | ORAL_TABLET | Freq: Every day | ORAL | Status: DC
  Administered 2024-08-30 – 2024-09-02 (×4): 6 mg via ORAL
  Filled 2024-08-30 (×4): qty 2

## 2024-08-30 MED ORDER — HYDRALAZINE HCL 50 MG PO TABS
25.0000 mg | ORAL_TABLET | Freq: Three times a day (TID) | ORAL | Status: DC
  Administered 2024-08-30 – 2024-09-02 (×8): 25 mg via ORAL
  Filled 2024-08-30 (×9): qty 1

## 2024-08-30 NOTE — Progress Notes (Signed)
 Progress Note   Patient: [name] FMW:[id number] DOB: [birth year] DOA: 08/25/2024     4 DOS: the patient was seen and examined on 08/30/2024   Brief hospital course: [name] is a 89 y.o. female with medical history significant for CAD s/p CABG (1999), former smoker, COPD, HTN,,CKD lllb, anxiety, venous stasis on as needed Lasix , being admitted with COVID-positive RLL pneumonia/COPD exacerbation, and new O2 requirement of 3 L.  Upon arrival in the ED, oxygen saturation was 84% on room air. CT chest without contrast showed right lower lobe infiltrates as well as small right pleural effusion with scattered tree-in-bud nodules. Patient was placed on steroids and antibiotics. Patient also had a proBNP of 17,000, has acute on chronic diastolic congestive heart failure.  Started on IV Lasix  on 12/10.   Principal Problem:   CAP (community acquired pneumonia), right lower lobe Active Problems:   COVID-19 virus infection   COPD with acute exacerbation (HCC)   Acute respiratory failure with hypoxia (HCC)   Coronary artery disease s/p CABG   Chronic venous stasis   Stage 3b chronic kidney disease (HCC)   Essential (primary) hypertension   Anxiety disorder   Acute on chronic diastolic CHF (congestive heart failure) (HCC)   Assessment and Plan:  CAP (community acquired pneumonia), right lower lobe COVID-positive COPD exacerbation Acute respiratory failure with hypoxia Patient presented with cough productive of green phlegm and shortness of breath, hypoxic to 84%.  COVID-19 positive, suspect additional bacterial infection given lobar infiltrate --Continue IV Rocephin  and azithromycin , procalcitonin level only 0.11, antibiotic changed to Augmentin  and oral Zithromax  on 12/11. Completed Zithromax  today, will discontinue Augmentin  tomorrow. Change steroids to Decadron  to complete 10-day course   Acute on chronic diastolic congestive heart failure.  Coronary artery disease s/p  CABG Patient short of breath could be secondary to congestive heart acute exacerbation, reviewed patient echocardiogram performed in 01/2024 ejection fraction 50 to 55% with grade 2 diastolic dysfunction. However, patient proBNP level was 17,000.  She was given 1 dose IV Lasix , renal functions is not getting worse.  Started to 40 mg IV Lasix  twice a day on 12/11, patient diuresing well.  Condition appears to be improving, continue.  Monitor electrolytes and renal function.  Hyperkalemia  Chronic kidney disease stage IIIb. Patient has required 2 doses of Lokelma , potassium has normalized.  Renal function still stable after Lasix .  Recheck of BMP, if potassium still running high, we will have to discontinue ARB.   Transaminitis -- AST 170, ALT 251, alk phos 143, normal T bili.  Pt has no GI symptoms Suspect due to Covid infection.     Anxiety disorder --Continue mirtazapine    Essential (primary) hypertension Patient is on beta-blocker, ARB, Imdur , blood pressure still running high, added hydralazine.        Subjective:  Patient slept better last night, no confusion.  Short of breath is improving.  Still have a cough, nonproductive.  She also had a BM today.  Physical Exam: Vitals:   08/29/24 1755 08/29/24 1948 08/29/24 2231 08/30/24 0756  BP: (!) 151/55 (!) 143/71 (!) 152/57 (!) 173/75  Pulse: 70 69 71 62  Resp: 18 18 18 16   Temp: 97.9 F (36.6 C) (!) 97.5 F (36.4 C) 97.9 F (36.6 C)   TempSrc:  Oral Oral   SpO2: 97% 100% 100% 95%  Weight:      Height:       General exam: Appears calm and comfortable  Respiratory system: Crackles in the bases.  Respiratory effort normal. Cardiovascular system: S1 & S2 heard, RRR. No JVD, murmurs, rubs, gallops or clicks.  Gastrointestinal system: Abdomen is nondistended, soft and nontender. No organomegaly or masses felt. Normal bowel sounds heard. Central nervous system: Alert and oriented. No focal neurological deficits. Extremities: 2+  leg edema Skin: No rashes, lesions or ulcers Psychiatry: Judgement and insight appear normal. Mood & affect appropriate.    Data Reviewed:  Lab results reviewed.  Family Communication: Daughter updated at bedside.  Disposition: Status is: Inpatient Remains inpatient appropriate because: Severity of disease, IV treatment.     Time spent: 50 minutes  Author: Murvin Mana, MD 08/30/2024 3:20 PM  For on call review [email or web address].

## 2024-08-30 NOTE — Progress Notes (Signed)
 Mobility Specialist - Progress Note  Pre-mobility:  SpO2-97%  During mobility: , SpO2-95%  Post-mobility:  SPO2-98%   08/30/24 1700  Mobility  Activity Ambulated with assistance;Respositioned in chair  Level of Assistance Standby assist, set-up cues, supervision of patient - no hands on  Assistive Device Front wheel walker  Distance Ambulated (ft) 30 ft  Range of Motion/Exercises Active;All extremities  Activity Response Tolerated well  Mobility visit 1 Mobility  Mobility Specialist Start Time (ACUTE ONLY) 1610  Mobility Specialist Stop Time (ACUTE ONLY) 1622  Mobility Specialist Time Calculation (min) (ACUTE ONLY) 12 min   Pt was in the recliner on RA and guest in the room upon entry. Pt agreed to mobility.  Pt O2 vitals were taken throughout activity and remained WNL. Pt is able to STS independently with 2 WW. Pt ambulated well. Pt  didn't need recovery break throughout activity. After activity pt repositioned in the recliner with needs in reach and guest in the room.  Clem Rodes Mobility Specialist 08/30/2024, 5:04 PM

## 2024-08-30 NOTE — Plan of Care (Signed)
°  Problem: Activity: °Goal: Ability to tolerate increased activity will improve °Outcome: Progressing °  °Problem: Respiratory: °Goal: Levels of oxygenation will improve °Outcome: Progressing °Goal: Ability to maintain adequate ventilation will improve °Outcome: Progressing °  °

## 2024-08-30 NOTE — Progress Notes (Signed)
 Physical Therapy Treatment Patient Details Name: [name] MRN: [id number] DOB: [birth year] Today's Date: 08/30/2024   History of Present Illness Pt is a 89 y.o. female being admitted with COVID-positive RLL pneumonia/COPD exacerbation, and new O2 requirement of 3 L.  She presented with a 1 week history of a cough productive of green phlegm, associated with shortness of breath and wheezing  in the setting of a recent return from an [place]. PMH of CAD s/p CABG (1999), former smoker, COPD, HTN,,CKD lllb, anxiety, venous stasis on as needed Lasix .    PT Comments  Pt seen for PT tx with pt agreeable despite fatigue, daughter reports she just assisted pt to the bathroom. Pt performs exercises with cuing re: technique, for BLE & overall strengthening. Pt eager to go home. Will continue to follow pt acutely to progress mobility as able.    If plan is discharge home, recommend the following: A little help with walking and/or transfers;A little help with bathing/dressing/bathroom;Assist for transportation;Help with stairs or ramp for entrance   Can travel by private vehicle        Equipment Recommendations  BSC/3in1    Recommendations for Other Services       Precautions / Restrictions Precautions Precautions: Fall Restrictions Weight Bearing Restrictions Per Provider Order: No     Mobility  Bed Mobility               General bed mobility comments: Not tested, pt received & left sitting in recliner    Transfers Overall transfer level: Needs assistance Equipment used: Rolling walker (2 wheels) Transfers: Sit to/from Stand Sit to Stand: Contact guard assist           General transfer comment: min cuing re: hand placement to push to standing    Ambulation/Gait                   Stairs             Wheelchair Mobility     Tilt Bed    Modified Rankin (Stroke Patients Only)       Balance Overall balance assessment: Needs  assistance Sitting-balance support: Feet supported Sitting balance-Leahy Scale: Good     Standing balance support: During functional activity, Bilateral upper extremity supported, Reliant on assistive device for balance Standing balance-Leahy Scale: Fair                              Hotel Manager: Impaired Factors Affecting Communication: Hearing impaired  Cognition Arousal: Alert Behavior During Therapy: WFL for tasks assessed/performed   PT - Cognitive impairments: Difficult to assess Difficult to assess due to: Hard of hearing/deaf                       Following commands: Impaired Following commands impaired: Follows one step commands with increased time    Cueing Cueing Techniques: Verbal cues, Tactile cues, Visual cues  Exercises General Exercises - Lower Extremity Hip Flexion/Marching: AROM, Strengthening, Both, 10 reps, Standing (BUE on RW) Heel Raises: AROM, Strengthening, Both, 10 reps, Standing (BUE support on RW) Other Exercises Other Exercises: 5x sit<>stand from recliner, reviewing hand placement to push to standing    General Comments General comments (skin integrity, edema, etc.): SPO2 >90% on 2L/min via nasal cannula      Pertinent Vitals/Pain Pain Assessment Pain Assessment: No/denies pain    Home Living  Prior Function            PT Goals (current goals can now be found in the care plan section) Acute Rehab PT Goals Patient Stated Goal: go home when I feel better PT Goal Formulation: With patient Time For Goal Achievement: 09/09/24 Potential to Achieve Goals: Good Progress towards PT goals: Progressing toward goals    Frequency    Min 2X/week      PT Plan      Co-evaluation              AM-PAC PT 6 Clicks Mobility   Outcome Measure  Help needed turning from your back to your side while in a flat bed without using bedrails?: None Help  needed moving from lying on your back to sitting on the side of a flat bed without using bedrails?: A Little Help needed moving to and from a bed to a chair (including a wheelchair)?: A Little Help needed standing up from a chair using your arms (e.g., wheelchair or bedside chair)?: A Little Help needed to walk in hospital room?: A Little Help needed climbing 3-5 steps with a railing? : A Lot 6 Click Score: 18    End of Session Equipment Utilized During Treatment: Oxygen Activity Tolerance: Patient tolerated treatment well;Patient limited by fatigue Patient left: in chair;with call bell/phone within reach;with family/visitor present Nurse Communication: Mobility status PT Visit Diagnosis: Other abnormalities of gait and mobility (R26.89);Muscle weakness (generalized) (M62.81)     Time: 8851-8843 PT Time Calculation (min) (ACUTE ONLY): 8 min  Charges:    $Therapeutic Activity: 8-22 mins PT General Charges $$ ACUTE PT VISIT: 1 Visit                     Richerd Pinal, PT, DPT 08/30/2024, 12:18 PM   Richerd CHRISTELLA Pinal 08/30/2024, 12:17 PM

## 2024-08-30 NOTE — TOC Progression Note (Signed)
 Transition of Care [facility]) - Progression Note    Patient Details  Name: [name] MRN: [id number] Date of Birth: 1926/08/29  Transition of Care [facility]) CM/SW Contact  Dalia GORMAN Fuse, RN Phone Number: 08/30/2024, 9:49 AM  Clinical Narrative:     Hypertensive, systolic BP 176. Remains on O2.  [facility] to follow for [facility]. Will need O2 setup.  Expected Discharge Plan: Home w Home Health Services Barriers to Discharge: Continued Medical Work up               Expected Discharge Plan and Services   Discharge Planning Services: CM Consult Post Acute Care Choice: Home Health Living arrangements for the past 2 months: Single Family Home                           HH Arranged: PT, OT           Social Drivers of Health (SDOH) Interventions SDOH Screenings   Food Insecurity: Patient Declined (08/26/2024)  Housing: Patient Declined (08/26/2024)  Transportation Needs: Patient Declined (08/26/2024)  Utilities: Patient Declined (08/26/2024)  Alcohol Screen: Low Risk (02/21/2024)  Depression (PHQ2-9): Low Risk (07/15/2024)  Financial Resource Strain: Low Risk (02/21/2024)  Physical Activity: Inactive (02/21/2024)  Social Connections: Patient Declined (08/26/2024)  Stress: No Stress Concern Present (02/21/2024)  Tobacco Use: Medium Risk (08/25/2024)  Health Literacy: Inadequate Health Literacy (02/21/2024)    Readmission Risk Interventions     No data to display

## 2024-08-31 DIAGNOSIS — J189 Pneumonia, unspecified organism: Secondary | ICD-10-CM | POA: Diagnosis not present

## 2024-08-31 NOTE — Plan of Care (Signed)
°  Problem: Nutrition: Goal: Adequate nutrition will be maintained Outcome: Progressing   Problem: Safety: Goal: Ability to remain free from injury will improve Outcome: Progressing   Problem: Respiratory: Goal: Ability to maintain a clear airway will improve Outcome: Progressing Goal: Levels of oxygenation will improve Outcome: Progressing Goal: Ability to maintain adequate ventilation will improve Outcome: Progressing

## 2024-08-31 NOTE — Progress Notes (Signed)
 Progress Note   Patient: [name] FMW:[id number] DOB: Jan 10, 1926 DOA: 08/25/2024     5 DOS: the patient was seen and examined on 08/31/2024   Brief hospital course: [name] is a 89 y.o. female with medical history significant for CAD s/p CABG (1999), former smoker, COPD, HTN,,CKD lllb, anxiety, venous stasis on as needed Lasix , being admitted with COVID-positive RLL pneumonia/COPD exacerbation, and new O2 requirement of 3 L.  Upon arrival in the ED, oxygen saturation was 84% on room air. CT chest without contrast showed right lower lobe infiltrates as well as small right pleural effusion with scattered tree-in-bud nodules. Patient was placed on steroids and antibiotics. Patient also had a proBNP of 17,000, has acute on chronic diastolic congestive heart failure.  Started on IV Lasix  on 12/10.   Principal Problem:   CAP (community acquired pneumonia), right lower lobe Active Problems:   COVID-19 virus infection   COPD with acute exacerbation (HCC)   Acute respiratory failure with hypoxia (HCC)   Coronary artery disease s/p CABG   Chronic venous stasis   Stage 3b chronic kidney disease (HCC)   Essential (primary) hypertension   Anxiety disorder   Acute on chronic diastolic CHF (congestive heart failure) (HCC)   Assessment and Plan:  CAP (community acquired pneumonia), right lower lobe COVID-positive COPD exacerbation Acute respiratory failure with hypoxia Patient presented with cough productive of green phlegm and shortness of breath, hypoxic to 84%.  COVID-19 positive, suspect additional bacterial infection given lobar infiltrate --Continue IV Rocephin  and azithromycin , procalcitonin level only 0.11, antibiotic changed to Augmentin  and oral Zithromax  on 12/11. Antibiotics completed. Change steroids to Decadron  to complete 10-day course Continue wean off oxygen.   Acute on chronic diastolic congestive heart failure.  Coronary artery disease s/p CABG Patient  short of breath could be secondary to congestive heart acute exacerbation, reviewed patient echocardiogram performed in 01/2024 ejection fraction 50 to 55% with grade 2 diastolic dysfunction. However, patient proBNP level was 17,000.  She was given 1 dose IV Lasix , renal functions is not getting worse.  Started to 40 mg IV Lasix  twice a day on 12/11, patient diuresing well.  Patient still has significant volume overload, still requiring oxygen.  Continue IV Lasix , renal function still stable/better   Hyperkalemia  Chronic kidney disease stage IIIb. Patient so far has required 2 doses of Lokelma .  Potassium has normalized today, continued on IV Lasix , renal function is getting better.   Transaminitis -- AST 170, ALT 251, alk phos 143, normal T bili.  Pt has no GI symptoms Suspect due to Covid infection.     Anxiety disorder --Continue mirtazapine    Essential (primary) hypertension Patient is on beta-blocker, ARB, Imdur , blood pressure still running high, added hydralazine .       Subjective:  Patient doing well today, short of breath much improved.  Cough better  Physical Exam: Vitals:   08/30/24 1613 08/30/24 2021 08/31/24 0459 08/31/24 0840  BP: 127/62 (!) 164/82 (!) 151/71 (!) 160/88  Pulse: 68 65 61 (!) 102  Resp: 17 17 18 18   Temp: 99 F (37.2 C) 97.9 F (36.6 C) 97.6 F (36.4 C) 97.7 F (36.5 C)  TempSrc:   Oral Oral  SpO2: 98% 98% 99% 99%  Weight:      Height:       General exam: Appears calm and comfortable  Respiratory system: Still crackles in the bases, more air movement. Respiratory effort normal. Cardiovascular system: S1 & S2 heard, RRR. No JVD, murmurs,  rubs, gallops or clicks.  Gastrointestinal system: Abdomen is nondistended, soft and nontender. No organomegaly or masses felt. Normal bowel sounds heard. Central nervous system: Alert and oriented. No focal neurological deficits. Extremities: 2+ leg edema but improving Skin: No rashes, lesions or  ulcers Psychiatry: Judgement and insight appear normal. Mood & affect appropriate.    Data Reviewed:  Lab results reviewed.  Family Communication: Daughter updated at bedside  Disposition: Status is: Inpatient Remains inpatient appropriate because: Severity of disease, IV treatment     Time spent: 35 minutes  Author: Murvin Mana, MD 08/31/2024 1:05 PM  For on call review [email or web address].

## 2024-09-01 DIAGNOSIS — J9601 Acute respiratory failure with hypoxia: Secondary | ICD-10-CM | POA: Diagnosis not present

## 2024-09-01 DIAGNOSIS — I5033 Acute on chronic diastolic (congestive) heart failure: Secondary | ICD-10-CM | POA: Diagnosis not present

## 2024-09-01 DIAGNOSIS — U071 COVID-19: Secondary | ICD-10-CM | POA: Diagnosis not present

## 2024-09-01 LAB — BASIC METABOLIC PANEL WITH GFR
Anion gap: 5 (ref 5–15)
BUN: 45 mg/dL — ABNORMAL HIGH (ref 8–23)
CO2: 42 mmol/L — ABNORMAL HIGH (ref 22–32)
Calcium: 8.7 mg/dL — ABNORMAL LOW (ref 8.9–10.3)
Chloride: 97 mmol/L — ABNORMAL LOW (ref 98–111)
Creatinine, Ser: 1.47 mg/dL — ABNORMAL HIGH (ref 0.44–1.00)
GFR, Estimated: 32 mL/min — ABNORMAL LOW (ref >60)
Glucose, Bld: 115 mg/dL — ABNORMAL HIGH (ref 70–99)
Potassium: 4.1 mmol/L (ref 3.5–5.1)
Sodium: 144 mmol/L (ref 135–145)

## 2024-09-01 MED ORDER — ACETAZOLAMIDE SODIUM 500 MG IJ SOLR
500.0000 mg | Freq: Once | INTRAMUSCULAR | Status: AC
  Administered 2024-09-01: 500 mg via INTRAVENOUS
  Filled 2024-09-01 (×2): qty 500

## 2024-09-01 MED ORDER — FUROSEMIDE 10 MG/ML IJ SOLN
60.0000 mg | Freq: Every day | INTRAMUSCULAR | Status: DC
  Administered 2024-09-01 – 2024-09-02 (×2): 60 mg via INTRAVENOUS
  Filled 2024-09-01 (×2): qty 6

## 2024-09-01 NOTE — Progress Notes (Signed)
 Mobility Specialist - Progress Note   09/01/24 1200  Mobility  Activity Ambulated with assistance;Stood at bedside;Respositioned in chair  Level of Assistance Standby assist, set-up cues, supervision of patient - no hands on  Assistive Device Front wheel walker  Distance Ambulated (ft) 35 ft  Range of Motion/Exercises All extremities  Activity Response Tolerated well  Mobility visit 1 Mobility  Mobility Specialist Start Time (ACUTE ONLY) 1225  Mobility Specialist Stop Time (ACUTE ONLY) 1240  Mobility Specialist Time Calculation (min) (ACUTE ONLY) 15 min   Pt was in the recliner with guest in the room upon entry. Pt agreed to mobility. Pt is able today to STS with 2 WW and minA. Pt ambulated well within the room. Pt didn't need recovery break throughout activity. After activity pt repositioned in the recliner with needs in reach and guest in the room upon exit.  Clem Rodes Mobility Specialist 09/01/2024, 1:02 PM

## 2024-09-01 NOTE — Plan of Care (Signed)
   Problem: Education: Goal: Knowledge of risk factors and measures for prevention of condition will improve Outcome: Progressing   Problem: Coping: Goal: Psychosocial and spiritual needs will be supported Outcome: Progressing

## 2024-09-01 NOTE — Progress Notes (Signed)
 Progress Note   Patient: REMEE CHARLEY FMW:[id number] DOB: 22-Jan-1926 DOA: 08/25/2024     6 DOS: the patient was seen and examined on 09/01/2024   Brief hospital course: ROLINDA IMPSON is a 89 y.o. female with medical history significant for CAD s/p CABG (1999), former smoker, COPD, HTN,,CKD lllb, anxiety, venous stasis on as needed Lasix , being admitted with COVID-positive RLL pneumonia/COPD exacerbation, and new O2 requirement of 3 L.  Upon arrival in the ED, oxygen saturation was 84% on room air. CT chest without contrast showed right lower lobe infiltrates as well as small right pleural effusion with scattered tree-in-bud nodules. Patient was placed on steroids and antibiotics. Patient also had a proBNP of 17,000, has acute on chronic diastolic congestive heart failure.  Started on IV Lasix  on 12/10.   Principal Problem:   CAP (community acquired pneumonia), right lower lobe Active Problems:   COVID-19 virus infection   COPD with acute exacerbation (HCC)   Acute respiratory failure with hypoxia (HCC)   Coronary artery disease s/p CABG   Chronic venous stasis   Stage 3b chronic kidney disease (HCC)   Essential (primary) hypertension   Anxiety disorder   Acute on chronic diastolic CHF (congestive heart failure) (HCC)   Assessment and Plan: CAP (community acquired pneumonia), right lower lobe COVID infection. COPD exacerbation Acute respiratory failure with hypoxia Patient presented with cough productive of green phlegm and shortness of breath, hypoxic to 84%.  COVID-19 positive, suspect additional bacterial infection given lobar infiltrate --Continue IV Rocephin  and azithromycin , procalcitonin level only 0.11, antibiotic changed to Augmentin  and oral Zithromax  on 12/11. Antibiotics completed. Change steroids to Decadron  to complete 10-day course Continue wean off oxygen.  Condition improved.   Acute on chronic diastolic congestive heart failure.  Coronary artery  disease s/p CABG Metabolic alkalosis secondary to diuretics. Patient short of breath could be secondary to congestive heart acute exacerbation, reviewed patient echocardiogram performed in 01/2024 ejection fraction 50 to 55% with grade 2 diastolic dysfunction. However, patient proBNP level was 17,000.  She was given 1 dose IV Lasix , renal functions is not getting worse.  Started to 40 mg IV Lasix  twice a day on 12/11, patient diuresing well.  Condition markedly improved, renal function continued to getting better, patient has developed metabolic alkalosis from diuretics.  I will reduce Lasix  to once a day at a 60 mg, add acetazolamide .     Hyperkalemia  Chronic kidney disease stage IIIb. Patient so far has required 2 doses of Lokelma .  Potassium level continue to be normal, renal function still improving.   Transaminitis -- AST 170, ALT 251, alk phos 143, normal T bili.  Pt has no GI symptoms Suspect due to Covid infection.     Anxiety disorder --Continue mirtazapine    Essential (primary) hypertension Patient is on beta-blocker, ARB, Imdur , blood pressure still running high, added hydralazine .        Subjective:  Patient doing much better today, off oxygen, no short of breath or cough.  Physical Exam: Vitals:   08/31/24 2245 09/01/24 0433 09/01/24 0433 09/01/24 0518  BP: (!) 159/63 (!) 153/73 (!) 153/73   Pulse: 76 79 76   Resp:  18 18   Temp: 97.6 F (36.4 C) 97.9 F (36.6 C) 97.9 F (36.6 C)   TempSrc: Oral     SpO2: 97% (!) 88% (!) 88% 100%  Weight:      Height:       General exam: Appears calm and comfortable  Respiratory system:  A few crackles.SABRA Respiratory effort normal. Cardiovascular system: S1 & S2 heard, RRR. No JVD, murmurs, rubs, gallops or clicks.  Gastrointestinal system: Abdomen is nondistended, soft and nontender. No organomegaly or masses felt. Normal bowel sounds heard. Central nervous system: Alert and oriented. No focal neurological  deficits. Extremities: Leg edema markedly improved. Skin: No rashes, lesions or ulcers Psychiatry: Judgement and insight appear normal. Mood & affect appropriate.    Data Reviewed:  Lab results reviewed.  Family Communication: Daughter updated at the bedside.  Disposition: Status is: Inpatient Remains inpatient appropriate because: Severity of disease, IV treatment. Likely discharge home tomorrow.     Time spent: 35 minutes  Author: Murvin Mana, MD 09/01/2024 11:04 AM  For on call review [email or web address].

## 2024-09-01 NOTE — Plan of Care (Signed)
   Problem: Respiratory: Goal: Ability to maintain a clear airway will improve Outcome: Progressing Goal: Levels of oxygenation will improve Outcome: Progressing Goal: Ability to maintain adequate ventilation will improve Outcome: Progressing

## 2024-09-02 ENCOUNTER — Other Ambulatory Visit: Payer: Self-pay

## 2024-09-02 LAB — BASIC METABOLIC PANEL WITH GFR
Anion gap: 9 (ref 5–15)
BUN: 44 mg/dL — ABNORMAL HIGH (ref 8–23)
CO2: 35 mmol/L — ABNORMAL HIGH (ref 22–32)
Calcium: 9.3 mg/dL (ref 8.9–10.3)
Chloride: 97 mmol/L — ABNORMAL LOW (ref 98–111)
Creatinine, Ser: 1.7 mg/dL — ABNORMAL HIGH (ref 0.44–1.00)
GFR, Estimated: 27 mL/min — ABNORMAL LOW (ref >60)
Glucose, Bld: 111 mg/dL — ABNORMAL HIGH (ref 70–99)
Potassium: 4.6 mmol/L (ref 3.5–5.1)
Sodium: 141 mmol/L (ref 135–145)

## 2024-09-02 MED ORDER — FLUTICASONE-SALMETEROL 250-50 MCG/ACT IN AEPB
1.0000 | INHALATION_SPRAY | Freq: Two times a day (BID) | RESPIRATORY_TRACT | 0 refills | Status: AC
  Filled 2024-09-02: qty 60, 30d supply, fill #0

## 2024-09-02 MED ORDER — DEXAMETHASONE 2 MG PO TABS
6.0000 mg | ORAL_TABLET | Freq: Every day | ORAL | 0 refills | Status: AC
  Filled 2024-09-02: qty 9, 3d supply, fill #0

## 2024-09-02 MED ORDER — GUAIFENESIN ER 600 MG PO TB12
600.0000 mg | ORAL_TABLET | Freq: Two times a day (BID) | ORAL | 0 refills | Status: AC
  Filled 2024-09-02: qty 28, 14d supply, fill #0

## 2024-09-02 MED ORDER — ALBUMIN HUMAN 25 % IV SOLN
25.0000 g | Freq: Once | INTRAVENOUS | Status: AC
  Administered 2024-09-02: 13:00:00 25 g via INTRAVENOUS
  Filled 2024-09-02: qty 100

## 2024-09-02 MED ORDER — FLUTICASONE FUROATE-VILANTEROL 200-25 MCG/ACT IN AEPB
1.0000 | INHALATION_SPRAY | Freq: Every day | RESPIRATORY_TRACT | 0 refills | Status: DC
  Filled 2024-09-02: qty 60, 30d supply, fill #0

## 2024-09-02 NOTE — TOC Transition Note (Signed)
 Transition of Care [facility]) - Discharge Note   Patient Details  Name: [name] MRN: [id number] Date of Birth: June 01, 1926  [facility] Care [facility]) CM/SW Contact:  Dalia GORMAN Fuse, RN Phone Number: 09/02/2024, 1:14 PM   Clinical Narrative:     Patient is medically clear to discharge to home. Satting above 90% on RA. [facility] will follow at home for [facility] PT.  Final next level of care: Home w Home Health Services Barriers to Discharge: Continued Medical Work up   Patient Goals and CMS Choice Patient states their goals for this hospitalization and ongoing recovery are:: to get back home CMS Medicare.gov Compare Post Acute Care list provided to:: Patient Represenative (must comment) Choice offered to / list presented to : Adult Children      Discharge Placement                       Discharge Plan and Services Additional resources added to the After Visit Summary for     Discharge Planning Services: CM Consult Post Acute Care Choice: Home Health                    HH Arranged: PT [facility] Agency: Other - See comment Lucrezia) Date [facility] Agency Contacted: 09/02/24 Time HH Agency Contacted: [phone]    Social Drivers of Health (SDOH) Interventions SDOH Screenings   Food Insecurity: Patient Declined (08/26/2024)  Housing: Patient Declined (08/26/2024)  Transportation Needs: Patient Declined (08/26/2024)  Utilities: Patient Declined (08/26/2024)  Alcohol Screen: Low Risk (02/21/2024)  Depression (PHQ2-9): Low Risk (07/15/2024)  Financial Resource Strain: Low Risk (02/21/2024)  Physical Activity: Inactive (02/21/2024)  Social Connections: Patient Declined (08/26/2024)  Stress: No Stress Concern Present (02/21/2024)  Tobacco Use: Medium Risk (08/25/2024)  Health Literacy: Inadequate Health Literacy (02/21/2024)     Readmission Risk Interventions     No data to display

## 2024-09-02 NOTE — Discharge Summary (Signed)
 Physician Discharge Summary   Patient: [name] MRN: [id number] DOB: 1926/07/14  Admit date:     08/25/2024  Discharge date: 09/02/2024  Discharge Physician: Murvin Mana   PCP: Marylynn Verneita CROME, MD   Recommendations at discharge:   Follow-up with PCP in 1 week. Check a BMP at next office visit.  Discharge Diagnoses: Principal Problem:   CAP (community acquired pneumonia), right lower lobe Active Problems:   COVID-19 virus infection   COPD with acute exacerbation (HCC)   Acute respiratory failure with hypoxia (HCC)   Coronary artery disease s/p CABG   Chronic venous stasis   Stage 3b chronic kidney disease (HCC)   Essential (primary) hypertension   Anxiety disorder   Acute on chronic diastolic CHF (congestive heart failure) (HCC)  Resolved Problems:   * No resolved hospital problems. *  Hospital Course: [name] is a 89 y.o. female with medical history significant for CAD s/p CABG (1999), former smoker, COPD, HTN,,CKD lllb, anxiety, venous stasis on as needed Lasix , being admitted with COVID-positive RLL pneumonia/COPD exacerbation, and new O2 requirement of 3 L.  Upon arrival in the ED, oxygen saturation was 84% on room air. CT chest without contrast showed right lower lobe infiltrates as well as small right pleural effusion with scattered tree-in-bud nodules. Patient was placed on steroids and antibiotics. Patient also had a proBNP of 17,000, has acute on chronic diastolic congestive heart failure.  Started on IV Lasix  on 12/10. Condition finally improved, off oxygen, short of breath improved.  Leg edema resolved.  Patient is medically stable for discharge.  Assessment and Plan: CAP (community acquired pneumonia), right lower lobe COVID infection. COPD exacerbation Acute respiratory failure with hypoxia Patient presented with cough productive of green phlegm and shortness of breath, hypoxic to 84%.  COVID-19 positive, suspect additional bacterial  infection given lobar infiltrate --Continue IV Rocephin  and azithromycin , procalcitonin level only 0.11, antibiotic changed to Augmentin  and oral Zithromax  on 12/11. Antibiotics completed. Change steroids to Decadron  to complete 10-day course Off oxygen.  Condition improved.   Acute on chronic diastolic congestive heart failure.  Coronary artery disease s/p CABG Metabolic alkalosis secondary to diuretics. Patient short of breath could be secondary to congestive heart acute exacerbation, reviewed patient echocardiogram performed in 01/2024 ejection fraction 50 to 55% with grade 2 diastolic dysfunction. However, patient proBNP level was 17,000.  She was given 1 dose IV Lasix , renal functions is not getting worse.  Started to 40 mg IV Lasix  twice a day on 12/11, patient diuresing well.  Condition markedly improved, renal function continued to getting better, patient has developed metabolic alkalosis from diuretics.  Received 1 dose of Lasix  and oxygen was dolomite yesterday.   Condition improved today.  Medically stable for discharge.   Hyperkalemia  Chronic kidney disease stage IIIb. Patient so far has required 2 doses of Lokelma .  Potassium level continue to be normal.  Creatinine finally worsened after several days of IV Lasix .  No additional Lasix  needed.  Will give albumin  prior to discharge.  Please check a BMP at next office visit.   Transaminitis -- AST 170, ALT 251, alk phos 143, normal T bili.  Pt has no GI symptoms Suspect due to Covid infection.     Anxiety disorder --Continue mirtazapine    Essential (primary) hypertension Resume home treatment.         Consultants: None Procedures performed: None  Disposition: Home health Diet recommendation:  Discharge Diet Orders (From admission, onward)     Start  Ordered   09/02/24 0000  Diet - low sodium heart healthy        09/02/24 1048           Cardiac diet DISCHARGE MEDICATION: Allergies as of 09/02/2024        Reactions   Amlodipine  Other (See Comments), Swelling   Edema, lower extremity        Medication List     STOP taking these medications    azithromycin  500 MG tablet Commonly known as: Zithromax    budesonide  0.5 MG/2ML nebulizer solution Commonly known as: PULMICORT    furosemide  20 MG tablet Commonly known as: LASIX    predniSONE  10 MG tablet Commonly known as: DELTASONE    propranolol  20 MG tablet Commonly known as: INDERAL        TAKE these medications    atorvastatin  40 MG tablet Commonly known as: LIPITOR Take 1 tablet (40 mg total) by mouth daily.   clopidogrel  75 MG tablet Commonly known as: PLAVIX  TAKE 1 TABLET BY MOUTH DAILY   cyanocobalamin  500 MCG tablet Commonly known as: VITAMIN B12 Take 500 mcg by mouth daily.   dexamethasone  6 MG tablet Commonly known as: DECADRON  Take 1 tablet (6 mg total) by mouth daily for 3 days. Start taking on: September 03, 2024   fluticasone  50 MCG/ACT nasal spray Commonly known as: FLONASE  Place 2 sprays into both nostrils daily.   fluticasone  furoate-vilanterol 200-25 MCG/ACT Aepb Commonly known as: BREO ELLIPTA  Inhale 1 puff into the lungs daily. Start taking on: September 03, 2024   guaiFENesin  600 MG 12 hr tablet Commonly known as: Mucinex  Take 1 tablet (600 mg total) by mouth 2 (two) times daily for 14 days.   ipratropium-albuterol  0.5-2.5 (3) MG/3ML Soln Commonly known as: DUONEB Take 3 mLs by nebulization every 6 (six) hours as needed. What changed: Another medication with the same name was removed. Continue taking this medication, and follow the directions you see here.   isosorbide  mononitrate 60 MG 24 hr tablet Commonly known as: IMDUR  TAKE ONE AND ONE-HALF TABLET BY MOUTH TWICE DAILY   losartan  100 MG tablet Commonly known as: COZAAR  TAKE 1 TABLET BY MOUTH DAILY   metoprolol  succinate 25 MG 24 hr tablet Commonly known as: TOPROL -XL Take 1 tablet (25 mg total) by mouth daily.   mirtazapine  15  MG tablet Commonly known as: REMERON  TAKE 1 TABLET BY MOUTH NIGHTLY   nitroGLYCERIN  0.4 MG SL tablet Commonly known as: NITROSTAT  PLACE 1 TABLET UNDER TONGUE EVERY 5 MINUTES AS NEEDED FOR CHEST PAIN. IF NO RELIEF IN 15 MINUTES CALL 911 ( MAXIMUM 3 TABLETS).   pantoprazole  40 MG tablet Commonly known as: PROTONIX  Take 1 tablet (40 mg total) by mouth daily.   ranolazine  500 MG 12 hr tablet Commonly known as: RANEXA  TAKE 1 TABLET BY MOUTH TWICE DAILY        Contact information for follow-up providers     Marylynn Verneita CROME, MD Follow up in 1 week(s).   Specialty: Internal Medicine Why: hospital follow up Contact information: [address] [place] [address] [phone]              Contact information for after-discharge care     Home Medical Care     [facility]) .   Service: Home Health Services Contact information: [address] [place]  [address] [phone]  Discharge Exam: Filed Weights   08/25/24 2010 08/26/24 0633  Weight: 59.5 kg 55.1 kg   General exam: Appears calm and comfortable  Respiratory system: Clear to auscultation. Respiratory effort normal. Cardiovascular system: S1 & S2 heard, RRR. No JVD, murmurs, rubs, gallops or clicks. No pedal edema. Gastrointestinal system: Abdomen is nondistended, soft and nontender. No organomegaly or masses felt. Normal bowel sounds heard. Central nervous system: Alert and oriented. No focal neurological deficits. Extremities: Edema essentially resolved Skin: No rashes, lesions or ulcers Psychiatry: Judgement and insight appear normal. Mood & affect appropriate.    Condition at discharge: fair  The results of significant diagnostics from this hospitalization (including imaging, microbiology, ancillary and laboratory) are listed below for reference.   Imaging Studies: US  Venous Img Lower Bilateral  (DVT) Result Date: 08/25/2024 EXAM: ULTRASOUND DUPLEX OF THE BILATERAL LOWER EXTREMITY VEINS TECHNIQUE: Duplex ultrasound using B-mode/gray scaled imaging and Doppler spectral analysis and color flow was obtained of the deep venous structures of the bilateral lower extremity. COMPARISON: None available. CLINICAL HISTORY: Dyspnea on exertion. FINDINGS: LEFT: The common femoral vein, femoral vein, popliteal vein, and posterior tibial vein of the left lower extremity demonstrate normal compressibility with normal color flow and spectral analysis. RIGHT: The common femoral vein, femoral vein, popliteal vein, and posterior tibial vein of the right lower extremity demonstrate normal compressibility with normal color flow and spectral analysis. IMPRESSION: 1. No evidence of deep venous thrombosis in the bilateral lower extremities. Electronically signed by: Franky Crease MD 08/25/2024 08:42 PM EST RP Workstation: [id number]   CT Chest Wo Contrast Result Date: 08/25/2024 CLINICAL DATA:  Respiratory illness, nondiagnostic x-ray. Shortness of breath. EXAM: CT CHEST WITHOUT CONTRAST TECHNIQUE: Multidetector CT imaging of the chest was performed following the standard protocol without IV contrast. RADIATION DOSE REDUCTION: This exam was performed according to the departmental dose-optimization program which includes automated exposure control, adjustment of the mA and/or kV according to patient size and/or use of iterative reconstruction technique. COMPARISON:  None Available. FINDINGS: Cardiovascular: The heart is enlarged and there is no pericardial effusion. Multi-vessel coronary artery calcifications are noted. There is evidence of prior cardiothoracic surgery. There is atherosclerotic calcification of the aorta without evidence of aneurysm. The pulmonary trunk is normal in caliber. Mediastinum/Nodes: No mediastinal or axillary lymphadenopathy. Evaluation of the hila is limited due to lack of IV contrast. The thyroid   gland, trachea, and esophagus are within normal limits. There is a large hiatal hernia. Lungs/Pleura: A small pleural effusion is noted on the right. Pleural and parenchymal scarring are present at the lung apices bilaterally. A few scattered tree-in-bud nodules are noted in the right upper and lower lobes atelectasis is noted bilaterally. Bronchial wall thickening is noted bilaterally. A few airspace opacities are present in the right lower lobe. No pneumothorax is seen. Upper Abdomen: Diverticula are noted along the colon without evidence of diverticulitis. No acute abnormality is seen. Musculoskeletal: Sternotomy wires are noted. Degenerative changes are present in the thoracic spine. No acute or suspicious osseous abnormality is seen. IMPRESSION: 1. Patchy airspace disease in the right lower lobe, suspicious for developing infiltrate. 2. Small right pleural effusion. 3. Scattered tree-in-bud nodules, likely infectious or inflammatory. 4. Large hiatal hernia. 5. Cardiomegaly with coronary artery calcifications. 6. Aortic atherosclerosis. Electronically Signed   By: Leita Birmingham M.D.   On: 08/25/2024 18:39   DG Chest 2 View Result Date: 08/25/2024 CLINICAL DATA:  Short of breath, weakness EXAM: CHEST - 2 VIEW COMPARISON:  07/08/2024 FINDINGS: Frontal and lateral  views of the chest demonstrate postsurgical changes from CABG. The cardiac silhouette is unremarkable. There is chronic background scarring and emphysema, with small stable right pleural effusion or pleural thickening again noted. No acute airspace disease or pneumothorax. No acute bony abnormalities. IMPRESSION: 1. Stable chest, no acute process. Electronically Signed   By: Ozell Daring M.D.   On: 08/25/2024 13:18    Microbiology: Results for orders placed or performed during the hospital encounter of 08/25/24  Resp panel by RT-PCR (RSV, Flu A&B, Covid) Anterior Nasal Swab     Status: Abnormal   Collection Time: 08/25/24  5:40 PM   Specimen:  Anterior Nasal Swab  Result Value Ref Range Status   SARS Coronavirus 2 by RT PCR POSITIVE (A) NEGATIVE Final    Comment: (NOTE) SARS-CoV-2 target nucleic acids are DETECTED.  The SARS-CoV-2 RNA is generally detectable in upper respiratory specimens during the acute phase of infection. Positive results are indicative of the presence of the identified virus, but do not rule out bacterial infection or co-infection with other pathogens not detected by the test. Clinical correlation with patient history and other diagnostic information is necessary to determine patient infection status. The expected result is Negative.  Fact Sheet for Patients: bloggercourse.com  Fact Sheet for Healthcare Providers: seriousbroker.it  This test is not yet approved or cleared by the [place]  FDA and  has been authorized for detection and/or diagnosis of SARS-CoV-2 by FDA under an Emergency Use Authorization (EUA).  This EUA will remain in effect (meaning this test can be used) for the duration of  the COVID-19 declaration under Section 564(b)(1) of the A ct, 21 U.S.C. section 360bbb-3(b)(1), unless the authorization is terminated or revoked sooner.     Influenza A by PCR NEGATIVE NEGATIVE Final   Influenza B by PCR NEGATIVE NEGATIVE Final    Comment: (NOTE) The Xpert Xpress SARS-CoV-2/FLU/RSV plus assay is intended as an aid in the diagnosis of influenza from Nasopharyngeal swab specimens and should not be used as a sole basis for treatment. Nasal washings and aspirates are unacceptable for Xpert Xpress SARS-CoV-2/FLU/RSV testing.  Fact Sheet for Patients: bloggercourse.com  Fact Sheet for Healthcare Providers: seriousbroker.it  This test is not yet approved or cleared by the [place]  FDA and has been authorized for detection and/or diagnosis of SARS-CoV-2 by FDA under an Emergency Use  Authorization (EUA). This EUA will remain in effect (meaning this test can be used) for the duration of the COVID-19 declaration under Section 564(b)(1) of the Act, 21 U.S.C. section 360bbb-3(b)(1), unless the authorization is terminated or revoked.     Resp Syncytial Virus by PCR NEGATIVE NEGATIVE Final    Comment: (NOTE) Fact Sheet for Patients: bloggercourse.com  Fact Sheet for Healthcare Providers: seriousbroker.it  This test is not yet approved or cleared by the [place]  FDA and has been authorized for detection and/or diagnosis of SARS-CoV-2 by FDA under an Emergency Use Authorization (EUA). This EUA will remain in effect (meaning this test can be used) for the duration of the COVID-19 declaration under Section 564(b)(1) of the Act, 21 U.S.C. section 360bbb-3(b)(1), unless the authorization is terminated or revoked.  Performed at [facility], [address] [place], [place] [address]    *Note: Due to a large number of results and/or encounters for the requested time period, some results have not been displayed. A complete set of results can be found in Results Review.    Labs: CBC: Recent Labs  Lab 08/27/24 0551 08/28/24 9476  WBC 11.6* 13.9*  NEUTROABS 10.2*  --   HGB 11.0* 10.8*  HCT 34.6* 35.1*  MCV 105.2* 109.3*  PLT 165 176   Basic Metabolic Panel: Recent Labs  Lab 08/28/24 0523 08/29/24 0559 08/30/24 0549 09/01/24 0601 09/02/24 0924  NA 142 143 143 144 141  K 4.9 5.2* 4.3 4.1 4.6  CL 105 106 104 97* 97*  CO2 28 29 31  42* 35*  GLUCOSE 115* 105* 96 115* 111*  BUN 50* 48* 50* 45* 44*  CREATININE 1.55* 1.53* 1.49* 1.47* 1.70*  CALCIUM  8.3* 8.8* 8.4* 8.7* 9.3  MG  --  2.6* 2.6*  --   --    Liver Function Tests: Recent Labs  Lab 08/27/24 0551 08/28/24 0523  AST 170* 79*  ALT 251* 203*  ALKPHOS 143* 130*  BILITOT 0.3 0.2  PROT 6.1* 5.6*  ALBUMIN  3.3* 3.3*   CBG: No results  for input(s): GLUCAP in the last 168 hours.  Discharge time spent: .  Signed: Murvin Mana, MD [facility] 09/02/2024

## 2024-09-02 NOTE — Progress Notes (Signed)
 Physical Therapy Treatment Patient Details Name: [name] MRN: [id number] DOB: August 15, 1926 Today's Date: 09/02/2024   History of Present Illness Pt is a 89 y.o. female being admitted with COVID-positive RLL pneumonia/COPD exacerbation, and new O2 requirement of 3 L.  She presented with a 1 week history of a cough productive of green phlegm, associated with shortness of breath and wheezing  in the setting of a recent return from an [place] trip. PMH of CAD s/p CABG (1999), former smoker, COPD, HTN,,CKD lllb, anxiety, venous stasis on as needed Lasix .    PT Comments  Patient seated in recliner on arrival with no family present. Agreeable to PT treatment session. Ambulated within room with RW and supervision. Ambulated on 2L O2 and RA but unable to get consistent reading with pulse oximeter due to cold hands despite attempts at warming. Overall, patient mobilizing well with no concerns. Discharge plan remains appropriate.     If plan is discharge home, recommend the following: A little help with walking and/or transfers;A little help with bathing/dressing/bathroom;Assist for transportation;Help with stairs or ramp for entrance   Can travel by private vehicle        Equipment Recommendations  BSC/3in1    Recommendations for Other Services       Precautions / Restrictions Precautions Precautions: Fall Recall of Precautions/Restrictions: Intact Restrictions Weight Bearing Restrictions Per Provider Order: No     Mobility  Bed Mobility               General bed mobility comments: sitting in recliner on arrival    Transfers Overall transfer level: Needs assistance Equipment used: Rolling Adia Crammer (2 wheels) Transfers: Sit to/from Stand Sit to Stand: Supervision                Ambulation/Gait Ambulation/Gait assistance: Supervision Gait Distance (Feet): 25 Feet (+25) Assistive device: Rolling Rumaysa Sabatino (2 wheels) Gait Pattern/deviations: WFL(Within Functional  Limits) Gait velocity: decreased     General Gait Details: VSS on 2L throughout   Stairs             Wheelchair Mobility     Tilt Bed    Modified Rankin (Stroke Patients Only)       Balance Overall balance assessment: Needs assistance Sitting-balance support: Feet supported Sitting balance-Leahy Scale: Good     Standing balance support: During functional activity, Bilateral upper extremity supported, Reliant on assistive device for balance Standing balance-Leahy Scale: Fair                              Hotel Manager: Impaired Factors Affecting Communication: Hearing impaired  Cognition Arousal: Alert Behavior During Therapy: WFL for tasks assessed/performed   PT - Cognitive impairments: Difficult to assess Difficult to assess due to: Hard of hearing/deaf                     PT - Cognition Comments: HOH/ difficult understanding due to inability to see lips moving in mask Following commands: Impaired Following commands impaired: Follows one step commands with increased time    Cueing Cueing Techniques: Verbal cues, Tactile cues, Visual cues  Exercises      General Comments        Pertinent Vitals/Pain Pain Assessment Pain Assessment: No/denies pain    Home Living                          Prior Function  PT Goals (current goals can now be found in the care plan section) Acute Rehab PT Goals Patient Stated Goal: go home when I feel better PT Goal Formulation: With patient Time For Goal Achievement: 09/09/24 Potential to Achieve Goals: Good Progress towards PT goals: Progressing toward goals    Frequency    Min 2X/week      PT Plan      Co-evaluation              AM-PAC PT 6 Clicks Mobility   Outcome Measure  Help needed turning from your back to your side while in a flat bed without using bedrails?: None Help needed moving from lying on your back to  sitting on the side of a flat bed without using bedrails?: None Help needed moving to and from a bed to a chair (including a wheelchair)?: A Little Help needed standing up from a chair using your arms (e.g., wheelchair or bedside chair)?: A Little Help needed to walk in hospital room?: A Little Help needed climbing 3-5 steps with a railing? : A Lot 6 Click Score: 19    End of Session Equipment Utilized During Treatment: Oxygen Activity Tolerance: Patient tolerated treatment well;Patient limited by fatigue Patient left: in chair;with call bell/phone within reach Nurse Communication: Mobility status PT Visit Diagnosis: Other abnormalities of gait and mobility (R26.89);Muscle weakness (generalized) (M62.81)     Time: 8975-8952 PT Time Calculation (min) (ACUTE ONLY): 23 min  Charges:    $Therapeutic Activity: 23-37 mins PT General Charges $$ ACUTE PT VISIT: 1 Visit                     Maryanne Finder, PT, DPT Physical Therapist - [facility]  [facility]    Teren Franckowiak A Debria Broecker 09/02/2024, 10:51 AM

## 2024-09-03 ENCOUNTER — Telehealth: Payer: Self-pay

## 2024-09-03 ENCOUNTER — Encounter: Payer: Self-pay | Admitting: Internal Medicine

## 2024-09-03 DIAGNOSIS — J449 Chronic obstructive pulmonary disease, unspecified: Secondary | ICD-10-CM

## 2024-09-03 MED ORDER — IPRATROPIUM-ALBUTEROL 0.5-2.5 (3) MG/3ML IN SOLN: 3.0000 mL | Freq: Four times a day (QID) | RESPIRATORY_TRACT | 2 refills | Status: AC | PRN

## 2024-09-03 NOTE — Patient Instructions (Signed)
 Visit Information  Thank you for taking time to visit with me today. Please don't hesitate to contact me if I can be of assistance to you before our next scheduled telephone appointment.  Our next appointment is by telephone on 09/10/24 at 11 am  Following is a copy of your care plan:   Goals Addressed             This Visit's Progress    VBCI Transitions of Care (TOC) Care Plan       Problems:  Recent Hospitalization for treatment of community acquired pneumonia/ COVID 19. No Hospital Follow Up Provider appointment attempting to assist patient  Goal:  Over the next 30 days, the patient will not experience hospital readmission  Interventions:  Pneumonia/ COVID Interventions: Provided education to patient to enhance basic understanding of COVID-19 as a viral disease, measures to prevent exposure, signs and symptoms, recommended vaccine schedule, when to contact provider Counseled on importance of regular laboratory monitoring as prescribed Advised patient to discuss new or ongoing symptoms with provider Assessed social determinant of health barriers Encouraged increase in fluids to help thin secretions Advised to take deep breaths and do coughing exercises every 2-4 hours  Advised to have a hospital follow up visit with primary care  Attempted to assist patient with scheduling hospital follow up visit with primary care provider.  Available appointment time 4 weeks away. Advised daughter to call patients primary care provider and attempt to get an earlier appointment or an appointment with another provider. Assessed for any ongoing/ new symptoms Advised to seek emergency medical services for serious symptoms such as increase SOB and/ or chest pain.   Patient Self Care Activities:  Attend all scheduled provider appointments Call pharmacy for medication refills 3-7 days in advance of running out of medications Call provider office for new concerns or questions  Notify RN Care  Manager of TOC call rescheduling needs Participate in Transition of Care Program/Attend TOC scheduled calls Take medications as prescribed    Plan:  Telephone follow up appointment with care management team member scheduled for:  09/10/24 at 11 am The patient has been provided with contact information for the care management team and has been advised to call with any health related questions or concerns.         Daughter/ Patient verbalizes understanding of instructions and care plan provided today and agrees to view in MyChart. Active MyChart status and patient understanding of how to access instructions and care plan via MyChart confirmed with patient.     The patient has been provided with contact information for the care management team and has been advised to call with any health related questions or concerns.   Please call the care guide team at [phone] if you need to cancel or reschedule your appointment.   Please call the Suicide and Crisis Lifeline: 988 call the [place]  National Suicide Prevention Lifeline: (916)694-3027 or TTY: 2626363675 TTY (940)152-8685) to talk to a trained counselor call 1-800-273-TALK (toll free, 24 hour hotline) if you are experiencing a Mental Health or Behavioral Health Crisis or need someone to talk to.  Arvin Seip RN, BSN, CCM [employer], Population Health Case Manager Phone: [phone]

## 2024-09-03 NOTE — Telephone Encounter (Signed)
 Copied from CRM #[id number]. Topic: Appointments - Appointment Scheduling >> Sep 03, 2024 12:25 PM Charolett L wrote: Patient daughter is calling to schedule an appointment for a hospital follow up. Patient was discharged 12/15 and needs an appt prior to 12/29 to avoid going back to the hospital Cb#[phone]

## 2024-09-03 NOTE — Telephone Encounter (Signed)
 Is there somewhere that you would like for me to work her in?

## 2024-09-03 NOTE — Transitions of Care (Post Inpatient/ED Visit) (Signed)
 09/03/2024  Name: [name] MRN: [id number] DOB: 08/08/1926  Today's TOC FU Call Status: Today's TOC FU Call Status:: Successful TOC FU Call Completed TOC FU Call Complete Date: 09/03/24  Patient's Name and Date of Birth confirmed. Name, DOB (HIPAA verified and telephone assessment completed with daughter, [name])  Transition Care Management Follow-up Telephone Call Date of Discharge: 09/02/24 Discharge Facility: [facility]) Type of Discharge: Inpatient Admission Primary Inpatient Discharge Diagnosis:: community acquired pneumonia How have you been since you were released from the hospital?: Better Any questions or concerns?: No  Items Reviewed: Did you receive and understand the discharge instructions provided?: Yes Medications obtained,verified, and reconciled?: Yes (Medications Reviewed) Any new allergies since your discharge?: No Dietary orders reviewed?: Yes Type of Diet Ordered:: low salt heart healthy Do you have support at home?: Yes People in Home [RPT]: child(ren), adult Name of Support/Comfort Primary Source: [name]  Medications Reviewed Today: Medications Reviewed Today     Reviewed by Marnee Sherrard E, RN (Registered Nurse) on 09/03/24 at [phone]  Med List Status: <None>   Medication Order Taking? Sig Documenting Provider Last Dose Status Informant  atorvastatin  (LIPITOR) 40 MG tablet [id number] Yes Take 1 tablet (40 mg total) by mouth daily. Marylynn Verneita CROME, MD  Active Pharmacy Records, Self  clopidogrel  (PLAVIX ) 75 MG tablet [id number] Yes TAKE 1 TABLET BY MOUTH DAILY Gollan, Timothy J, MD  Active Pharmacy Records, Self  dexamethasone  (DECADRON ) 2 MG tablet [id number] Yes Take 3 tablets (6 mg total) by mouth daily for 3 days. Laurita Pillion, MD  Active   fluticasone  (FLONASE ) 50 MCG/ACT nasal spray [id number] Yes Place 2 sprays into both nostrils daily. Marylynn Verneita CROME, MD  Active Self, Pharmacy Records           Med  Note (LAWS, REGINA C   Wed Feb 21, 2024 11:37 AM) As needed  fluticasone -salmeterol (ADVAIR ) 250-50 MCG/ACT AEPB [id number] Yes Inhale 1 puff into the lungs 2 (two) times daily. Laurita Pillion, MD  Active   guaiFENesin  (MUCINEX ) 600 MG 12 hr tablet [id number] Yes Take 1 tablet (600 mg total) by mouth 2 (two) times daily for 14 days. Laurita Pillion, MD  Active   ipratropium-albuterol  (DUONEB) 0.5-2.5 (3) MG/3ML SOLN [id number] Yes Take 3 mLs by nebulization every 6 (six) hours as needed. Marylynn Verneita CROME, MD  Active Pharmacy Records, Self  isosorbide  mononitrate (IMDUR ) 60 MG 24 hr tablet [id number] Yes TAKE ONE AND ONE-HALF TABLET BY MOUTH TWICE DAILY Gollan, Timothy J, MD  Active Pharmacy Records, Self  losartan  (COZAAR ) 100 MG tablet [id number] Yes TAKE 1 TABLET BY MOUTH DAILY Gollan, Timothy J, MD  Active Pharmacy Records, Self  metoprolol  succinate (TOPROL -XL) 25 MG 24 hr tablet [id number] Yes Take 1 tablet (25 mg total) by mouth daily. Gollan, Timothy J, MD  Active Pharmacy Records, Self  mirtazapine  (REMERON ) 15 MG tablet [id number] Yes TAKE 1 TABLET BY MOUTH NIGHTLY Tullo, Teresa L, MD  Active Pharmacy Records, Self  nitroGLYCERIN  (NITROSTAT ) 0.4 MG SL tablet [id number] Yes PLACE 1 TABLET UNDER TONGUE EVERY 5 MINUTES AS NEEDED FOR CHEST PAIN. IF NO RELIEF IN 15 MINUTES CALL 911 ( MAXIMUM 3 TABLETS). Gollan, Timothy J, MD  Active Pharmacy Records, Self  pantoprazole  (PROTONIX ) 40 MG tablet [id number] Yes Take 1 tablet (40 mg total) by mouth daily. Marylynn Verneita CROME, MD  Active Self, Pharmacy Records  ranolazine  (RANEXA ) 500 MG 12 hr tablet [id number] Yes TAKE 1 TABLET BY MOUTH TWICE DAILY Gollan,  Evalene PARAS, MD  Active Pharmacy Records, Self  vitamin B-12 (CYANOCOBALAMIN ) 500 MCG tablet [id number] Yes Take 500 mcg by mouth daily. [provider]  Active Self, Pharmacy Records            Home Care and Equipment/Supplies: Were Home Health Services Ordered?: Yes Name of Home Health Agency:: [facility]  home health Has Agency set up a time to come to your home?: No EMR reviewed for Home Health Orders: Orders present/patient has not received call (refer to CM for follow-up) (daughter given contact phone number to [facility] and also notified that contact phone number and address is on discharge instructions.) Any new equipment or medical supplies ordered?: No  Functional Questionnaire: Do you need assistance with bathing/showering or dressing?: Yes Do you need assistance with meal preparation?: Yes Do you need assistance with eating?: No Do you have difficulty maintaining continence: No Do you need assistance with getting out of bed/getting out of a chair/moving?: No Do you have difficulty managing or taking your medications?: Yes  Follow up appointments reviewed: Specialist Hospital Follow-up appointment confirmed?: NA Do you need transportation to your follow-up appointment?: No Do you understand care options if your condition(s) worsen?: Yes-patient verbalized understanding  SDOH Interventions Today    Flowsheet Row Most Recent Value  SDOH Interventions   Housing Interventions Intervention Not Indicated  Transportation Interventions Intervention Not Indicated  Utilities Interventions Intervention Not Indicated   Discussed and offered 30 day TOC program.  Patient's daughter verbally agreed.   The patient has been provided with contact information for the care management team and has been advised to call with any health -related questions or concerns.  The patient verbalized understanding with current plan of care.  The patient is directed to their insurance card regarding availability of benefits coverage.    Arvin Seip RN, BSN, CCM [employer], Population Health Case Manager Phone: [phone]

## 2024-09-04 ENCOUNTER — Telehealth: Admitting: Internal Medicine

## 2024-09-04 ENCOUNTER — Encounter: Payer: Self-pay | Admitting: Internal Medicine

## 2024-09-04 ENCOUNTER — Other Ambulatory Visit: Payer: Self-pay

## 2024-09-04 VITALS — BP 92/40 | HR 63 | Ht 62.0 in | Wt 121.0 lb

## 2024-09-04 DIAGNOSIS — Z09 Encounter for follow-up examination after completed treatment for conditions other than malignant neoplasm: Secondary | ICD-10-CM

## 2024-09-04 DIAGNOSIS — I10 Essential (primary) hypertension: Secondary | ICD-10-CM

## 2024-09-04 DIAGNOSIS — N1832 Chronic kidney disease, stage 3b: Secondary | ICD-10-CM

## 2024-09-04 DIAGNOSIS — J9601 Acute respiratory failure with hypoxia: Secondary | ICD-10-CM

## 2024-09-04 DIAGNOSIS — I5033 Acute on chronic diastolic (congestive) heart failure: Secondary | ICD-10-CM

## 2024-09-04 NOTE — Patient Instructions (Addendum)
°  You do not need daily lasix ,   continue to monitor  Your weight daily and take a dose if only if your overnight weight gain is 2 lbs,  or your weekly weight gain is 5 lbs.    Suspend imdur  and losartan  for now.  Resume imdur  once BP is 140/90 or higher

## 2024-09-04 NOTE — Telephone Encounter (Signed)
 Spoke with pt's daughter and scheduled pt for a virtual visit this evening at 5pm.

## 2024-09-04 NOTE — Progress Notes (Unsigned)
 Virtual Visit via Caregility   Note   This format is felt to be most appropriate for this patient at this time.  All issues noted in this document were discussed and addressed.  No physical exam was performed (except for noted visual exam findings with Video Visits).   I connected with [name]  on 09/04/2024 at  5:00 PM EST by a video enabled telemedicine application and verified that I am speaking with the correct person using two identifiers. Location patient: home Location provider: work or home office Persons participating in the virtual visit: patient, provider and [name] her daughter   I discussed the limitations, risks, security and privacy concerns of performing an evaluation and management service by telephone and the availability of in person appointments. I also discussed with the patient that there may be a patient responsible charge related to this service. The patient expressed understanding and agreed to proceed.  Reason for visit: hospital follow up  HPI:   [name] is a 89 y.o. female with medical history significant for CAD s/p CABG (1999), former smoker, COPD, HTN,,CKD lllb, anxiety, venous stasis on as needed Lasix , being admitted with COVID-positive  [name] is a 89 yr old female with a history of ischemic cardiomyopathy ,  COPD who was admitted  to [facility] on DEc 7 with acute hypoxic respiratory failure secondary to COVID positive RLL pneumonia  with a  new O2 requirement of 3 L. Upon arrival in the ED, oxygen saturation was 84% on room air. CT chest without contrast showed right lower lobe infiltrates as well as small right pleural effusion with scattered tree-in-bud nodules. Patient was placed on steroids and antibiotics. Patient also had a proBNP of 17,000, and diagnosed with  acute on chronic diastolic congestive heart failure.  Started on IV Lasix  on 12/10. GFR dropped and lasxis was discontinued prior to discharge home.   She was discharged home on Dec 15 after all  parameters appeared improved and stable .  She did not require supplemental oxygen at discharge   She reports profound fatigue  that started this morning . BP was checked todayand low  92/40  . Still taking imdur , losartan  and metoprolol  .  Cough has improved,  still productive of yellow sputum.  Appetite dimnisehd but eating     ROS: See pertinent positives and negatives per HPI.  Past Medical History:  Diagnosis Date   C. difficile colitis    CAD (coronary artery disease)    a. s/p CABG 1999; b. Nuclear 10/11, no scar or ischemia, EF 68%; b. Lexiscan  Myoview  06/18/14: no evidence of infarction or ischemia, EF 77%, low risk study; c. 02/2019 MV: EF 56%, ? mild lat ischemia->low risk.   Carotid artery disease    a. Doppler January, 2012, 40-59% bilateral; b. 02/2017 U/S: 1-39% bilat ICA stenosis.   Closed fracture pubis (HCC) 07/04/2017   COPD (chronic obstructive pulmonary disease) (HCC)    Diffuse cystic mastopathy    Diverticulitis    last colonoscopy  incomplete Jan 2011   Dizziness    stable now (Oct 2011) patient tells me question of TIA, we will obtain records   Gait abnormality    Patient complains of wobbly gait, December, 2013   GERD (gastroesophageal reflux disease)    History of migraines    Hx of CABG    a. 3 vessel in 1999   Hyperlipidemia    Hypertension    Indigestion    3 weeks, October 2011   Kidney stones  Rheumatic fever    SCC (squamous cell carcinoma), face 08/01/2016   Sinus bradycardia    Mild, December, 2013   SOB (shortness of breath)    Syncopal episodes    after 18 holes of golf and increase hydrochlorothiazide , resolved     Past Surgical History:  Procedure Laterality Date   APPENDECTOMY  1950   BREAST BIOPSY Right 1994   BREAST CYST ASPIRATION     multiple BIL   CATARACT EXTRACTION Right 2009   CATARACT EXTRACTION Left 2011   COLONOSCOPY  8011,7988   Dr. Dellie   CORONARY ARTERY BYPASS GRAFT  1999   esophagus stretched      TONSILLECTOMY  1939   TOTAL ABDOMINAL HYSTERECTOMY  1968   due to metrorrhagia    Family History  Problem Relation Age of Onset   Heart disease Mother    Heart disease Father    Cancer Neg Hx    Drug abuse Neg Hx    Breast cancer Neg Hx     SOCIAL HX: ***  Current Medications[1]  EXAM:  VITALS per patient if applicable:  GENERAL: alert, oriented, appears well and in no acute distress  HEENT: atraumatic, conjunttiva clear, no obvious abnormalities on inspection of external nose and ears  NECK: normal movements of the head and neck  LUNGS: on inspection no signs of respiratory distress, breathing rate appears normal, no obvious gross SOB, gasping or wheezing  CV: no obvious cyanosis  MS: moves all visible extremities without noticeable abnormality  PSYCH/NEURO: pleasant and cooperative, no obvious depression or anxiety, speech and thought processing grossly intact  ASSESSMENT AND PLAN: There are no diagnoses linked to this encounter.    I discussed the assessment and treatment plan with the patient. The patient was provided an opportunity to ask questions and all were answered. The patient agreed with the plan and demonstrated an understanding of the instructions.   The patient was advised to call back or seek an in-person evaluation if the symptoms worsen or if the condition fails to improve as anticipated.   I spent 30 minutes dedicated to the care of this patient on the date of this encounter to include pre-visit review of patient's medical history,  including recent ER visit, imaging studies and labs, face-to-face time with the patient , and post visit ordering of testing and therapeutics.    Katrina LITTIE Kettering, MD     [1]  Current Outpatient Medications:    atorvastatin  (LIPITOR) 40 MG tablet, Take 1 tablet (40 mg total) by mouth daily., Disp: 90 tablet, Rfl: 1   clopidogrel  (PLAVIX ) 75 MG tablet, TAKE 1 TABLET BY MOUTH DAILY, Disp: 90 tablet, Rfl: 3    dexamethasone  (DECADRON ) 2 MG tablet, Take 3 tablets (6 mg total) by mouth daily for 3 days., Disp: 9 tablet, Rfl: 0   fluticasone  (FLONASE ) 50 MCG/ACT nasal spray, Place 2 sprays into both nostrils daily., Disp: 11.1 mL, Rfl: 2   fluticasone -salmeterol (ADVAIR ) 250-50 MCG/ACT AEPB, Inhale 1 puff into the lungs 2 (two) times daily., Disp: 60 each, Rfl: 0   guaiFENesin  (MUCINEX ) 600 MG 12 hr tablet, Take 1 tablet (600 mg total) by mouth 2 (two) times daily for 14 days., Disp: 28 tablet, Rfl: 0   ipratropium-albuterol  (DUONEB) 0.5-2.5 (3) MG/3ML SOLN, Take 3 mLs by nebulization every 6 (six) hours as needed., Disp: 360 mL, Rfl: 2   isosorbide  mononitrate (IMDUR ) 60 MG 24 hr tablet, TAKE ONE AND ONE-HALF TABLET BY MOUTH TWICE DAILY, Disp: 270  tablet, Rfl: 3   losartan  (COZAAR ) 100 MG tablet, TAKE 1 TABLET BY MOUTH DAILY, Disp: 90 tablet, Rfl: 3   metoprolol  succinate (TOPROL -XL) 25 MG 24 hr tablet, Take 1 tablet (25 mg total) by mouth daily., Disp: 90 tablet, Rfl: 3   mirtazapine  (REMERON ) 15 MG tablet, TAKE 1 TABLET BY MOUTH NIGHTLY, Disp: 90 tablet, Rfl: 1   nitroGLYCERIN  (NITROSTAT ) 0.4 MG SL tablet, PLACE 1 TABLET UNDER TONGUE EVERY 5 MINUTES AS NEEDED FOR CHEST PAIN. IF NO RELIEF IN 15 MINUTES CALL 911 ( MAXIMUM 3 TABLETS)., Disp: 25 tablet, Rfl: 3   pantoprazole  (PROTONIX ) 40 MG tablet, Take 1 tablet (40 mg total) by mouth daily., Disp: 90 tablet, Rfl: 1   ranolazine  (RANEXA ) 500 MG 12 hr tablet, TAKE 1 TABLET BY MOUTH TWICE DAILY, Disp: 180 tablet, Rfl: 3   vitamin B-12 (CYANOCOBALAMIN ) 500 MCG tablet, Take 500 mcg by mouth daily., Disp: , Rfl:

## 2024-09-04 NOTE — Telephone Encounter (Signed)
 [name] can this patient be put into the 5p appt today?  I know she normally sees a virtual.  Want to try to get her hospital follow up completed before Tullo is on vacation.

## 2024-09-06 NOTE — Assessment & Plan Note (Signed)
 GFR dropped during IV diuresis;  advised patient to use lasxis only for weight gain of 2 l bs overnight or 5 lbs over 7 days

## 2024-09-06 NOTE — Assessment & Plan Note (Addendum)
 Secondary to lobar pneumonia and pulmonary edema  secondary to COVID infection..  her hypoxia has resolved.

## 2024-09-06 NOTE — Assessment & Plan Note (Signed)
 Advised patient to continue to monitor wt daily and  resume 20 mg lasix  for overnight weight gain of 2 lbs or weekly weight gain of 5 lbs.

## 2024-09-06 NOTE — Assessment & Plan Note (Signed)
 Patient is stable post discharge and has no new issues .  All  questions about discharge plans at the visit today for hospital follow up were addressed/   All labs , imaging studies and progress notes from admission were reviewed with patient today

## 2024-09-09 ENCOUNTER — Telehealth: Payer: Self-pay | Admitting: Internal Medicine

## 2024-09-09 ENCOUNTER — Other Ambulatory Visit

## 2024-09-09 DIAGNOSIS — I10 Essential (primary) hypertension: Secondary | ICD-10-CM | POA: Diagnosis not present

## 2024-09-09 NOTE — Telephone Encounter (Signed)
 Patient drop off Bp, pulse , oxygen level and weigh results. Paper is up front in Tullo's color folder.

## 2024-09-10 ENCOUNTER — Telehealth: Payer: Self-pay

## 2024-09-10 ENCOUNTER — Other Ambulatory Visit: Payer: Self-pay | Admitting: Internal Medicine

## 2024-09-10 ENCOUNTER — Ambulatory Visit: Payer: Self-pay | Admitting: Internal Medicine

## 2024-09-10 DIAGNOSIS — E875 Hyperkalemia: Secondary | ICD-10-CM

## 2024-09-10 LAB — COMPREHENSIVE METABOLIC PANEL WITH GFR
ALT: 29 U/L (ref 3–35)
AST: 18 U/L (ref 5–37)
Albumin: 3.7 g/dL (ref 3.5–5.2)
Alkaline Phosphatase: 98 U/L (ref 39–117)
BUN: 47 mg/dL — ABNORMAL HIGH (ref 6–23)
CO2: 26 meq/L (ref 19–32)
Calcium: 8.3 mg/dL — ABNORMAL LOW (ref 8.4–10.5)
Chloride: 107 meq/L (ref 96–112)
Creatinine, Ser: 1.56 mg/dL — ABNORMAL HIGH (ref 0.40–1.20)
GFR: 27.97 mL/min — ABNORMAL LOW
Glucose, Bld: 97 mg/dL (ref 70–99)
Potassium: 5.5 meq/L — ABNORMAL HIGH (ref 3.5–5.1)
Sodium: 142 meq/L (ref 135–145)
Total Bilirubin: 0.7 mg/dL (ref 0.2–1.2)
Total Protein: 5.9 g/dL — ABNORMAL LOW (ref 6.0–8.3)

## 2024-09-10 NOTE — Patient Instructions (Signed)
 Visit Information  Thank you for taking time to visit with me today. Please don't hesitate to contact me if I can be of assistance to you before our next scheduled telephone appointment.  Our next appointment is by telephone on 09/25/23 at 2 pm  Following is a copy of your care plan:   Goals Addressed             This Visit's Progress    VBCI Transitions of Care (TOC) Care Plan       Problems:  Recent Hospitalization for treatment of community acquired pneumonia/ COVID 19. No Hospital Follow Up Provider appointment attempting to assist patient  Daughter scheduled telemedicine hospital follow up for 09/04/24.  Hospital follow up completed with primary care provider.   Goal:  Over the next 30 days, the patient will not experience hospital readmission  Interventions:  Pneumonia/ COVID Interventions: Provided education to patient to enhance basic understanding of COVID-19 as a viral disease, measures to prevent exposure, signs and symptoms, recommended vaccine schedule, when to contact provider Counseled on importance of regular laboratory monitoring as prescribed Advised patient to discuss new or ongoing symptoms with provider Encouraged ongoing fluid intake Advised to take deep breaths and do coughing exercises every 2-4 hours - continue to use incentive spirometer Assessed for any ongoing/ new symptoms Medications reviewed and compliance discussed and advised.  Discussed medication changes. Assessed blood pressure reading Assessed patients appetite Referral to social worker for community resource needs and to discuss resources allowed with long term care policy Advised daughter to call patients long term care policy provider to discuss benefits.  Advised to seek emergency medical services for serious symptoms such as increase SOB and/ or chest pain.   Patient Self Care Activities:  Attend all scheduled provider appointments Call pharmacy for medication refills 3-7 days in advance  of running out of medications Call provider office for new concerns or questions  Notify RN Care Manager of TOC call rescheduling needs Participate in Transition of Care Program/Attend TOC scheduled calls Take medications as prescribed   Stay hydrated Continue to do deep breathing/ use incentive spirometer daily   Plan:  Telephone follow up appointment with care management team member scheduled for:  09/24/24 at 2 pm The patient has been provided with contact information for the care management team and has been advised to call with any health related questions or concerns.         Patient verbalizes understanding of instructions and care plan provided today and agrees to view in MyChart. Active MyChart status and patient understanding of how to access instructions and care plan via MyChart confirmed with patient.     The patient has been provided with contact information for the care management team and has been advised to call with any health related questions or concerns.   Please call the care guide team at [phone] if you need to cancel or reschedule your appointment.   Please call the Suicide and Crisis Lifeline: 988 call the [place]  National Suicide Prevention Lifeline: 803-343-6990 or TTY: (939)094-5031 TTY (707)151-5782) to talk to a trained counselor call 1-800-273-TALK (toll free, 24 hour hotline) if you are experiencing a Mental Health or Behavioral Health Crisis or need someone to talk to.  Arvin Seip RN, BSN, CCM [employer], Population Health Case Manager Phone: [phone]

## 2024-09-10 NOTE — Transitions of Care (Post Inpatient/ED Visit) (Signed)
 " Transition of Care week 2  Visit Note  09/10/2024  Name: [name] MRN: [id number]          DOB: [birth year]  Situation: Patient enrolled in [facility] 30-day program. Visit completed with patients daughter [name] by telephone.   Background:   Initial Transition Care Management Follow-up Telephone Call Discharge Date and Diagnosis: 09/02/24, community acquired pneumonia   Past Medical History:  Diagnosis Date   C. difficile colitis    CAD (coronary artery disease)    a. s/p CABG 1999; b. Nuclear 10/11, no scar or ischemia, EF 68%; b. Lexiscan  Myoview  06/18/14: no evidence of infarction or ischemia, EF 77%, low risk study; c. 02/2019 MV: EF 56%, ? mild lat ischemia->low risk.   Carotid artery disease    a. Doppler January, 2012, 40-59% bilateral; b. 02/2017 U/S: 1-39% bilat ICA stenosis.   Closed fracture pubis (HCC) 07/04/2017   COPD (chronic obstructive pulmonary disease) (HCC)    Diffuse cystic mastopathy    Diverticulitis    last colonoscopy  incomplete Jan 2011   Dizziness    stable now (Oct 2011) patient tells me question of TIA, we will obtain records   Gait abnormality    Patient complains of wobbly gait, December, 2013   GERD (gastroesophageal reflux disease)    History of migraines    Hx of CABG    a. 3 vessel in 1999   Hyperlipidemia    Hypertension    Indigestion    3 weeks, October 2011   Kidney stones    Rheumatic fever    SCC (squamous cell carcinoma), face 08/01/2016   Sinus bradycardia    Mild, December, 2013   SOB (shortness of breath)    Syncopal episodes    after 18 holes of golf and increase hydrochlorothiazide , resolved     Assessment: Patient Reported Symptoms: Cognitive Cognitive Status: No symptoms reported, Alert and oriented to person, place, and time, Insightful and able to interpret abstract concepts, Normal speech and language skills      Neurological Neurological Review of Symptoms: No symptoms reported    HEENT HEENT  Symptoms Reported: No symptoms reported      Cardiovascular Cardiovascular Symptoms Reported: Other: Cardiovascular Comment: daughter reports patient was having low blood pressure readings. She states she reported blood pressure reading of 92/40 on 09/04/24 to patients primary care provider at telemedicine hospital follow up appointment. Daughter states patients aide also provided a list of blood pressure readings and weight to the primary care provider office on yesterday 09/10/24.  Daughter states patients primary care provider discontinued patients Imdur  and losartan . She states patient continues to take metoprolol .  Daughter states patient is doing better. Reports blood pressure from last night was 137/60-70's.  Denies patient complianing of lightheadedness/ dizziness.  Respiratory Respiratory Symptoms Reported: No symptoms reported Additional Respiratory Details: Daughter states patient contiues to use her nebulizer an incentive spirometer    Endocrine Endocrine Symptoms Reported: No symptoms reported Is patient diabetic?: No    Gastrointestinal Gastrointestinal Symptoms Reported: No symptoms reported      Genitourinary Genitourinary Symptoms Reported: No symptoms reported    Integumentary Integumentary Symptoms Reported: No symptoms reported    Musculoskeletal Musculoskelatal Symptoms Reviewed: Weakness Additional Musculoskeletal Details: daughter states patient uses walker. Denies falls.        Psychosocial Psychosocial Symptoms Reported: No symptoms reported         There were no vitals filed for this visit. Pain Scale: 0-10 Pain Score: 0-No pain  Medications Reviewed Today     Reviewed by Lanika Colgate E, RN (Registered Nurse) on 09/10/24 at 1102  Med List Status: <None>   Medication Order Taking? Sig Documenting Provider Last Dose Status Informant  atorvastatin  (LIPITOR) 40 MG tablet [id number] Yes Take 1 tablet (40 mg total) by mouth daily. Marylynn Verneita CROME, MD  Active  Pharmacy Records, Self  clopidogrel  (PLAVIX ) 75 MG tablet [id number] Yes TAKE 1 TABLET BY MOUTH DAILY Gollan, Timothy J, MD  Active Pharmacy Records, Self  fluticasone  (FLONASE ) 50 MCG/ACT nasal spray [id number] Yes Place 2 sprays into both nostrils daily. Marylynn Verneita CROME, MD  Active Self, Pharmacy Records           Med Note (LAWS, REGINA C   Wed Feb 21, 2024 11:37 AM) As needed  fluticasone -salmeterol (ADVAIR ) 250-50 MCG/ACT AEPB [id number] Yes Inhale 1 puff into the lungs 2 (two) times daily. Laurita Pillion, MD  Active   guaiFENesin  (MUCINEX ) 600 MG 12 hr tablet [id number] Yes Take 1 tablet (600 mg total) by mouth 2 (two) times daily for 14 days. Laurita Pillion, MD  Active   ipratropium-albuterol  (DUONEB) 0.5-2.5 (3) MG/3ML SOLN [id number] Yes Take 3 mLs by nebulization every 6 (six) hours as needed. Tullo, Teresa L, MD  Active   isosorbide  mononitrate (IMDUR ) 60 MG 24 hr tablet [id number]  TAKE ONE AND ONE-HALF TABLET BY MOUTH TWICE DAILY  Patient not taking: Reported on 09/10/2024   Gollan, Timothy J, MD  Active Pharmacy Records, Self  losartan  (COZAAR ) 100 MG tablet [id number]  TAKE 1 TABLET BY MOUTH DAILY  Patient not taking: Reported on 09/10/2024   Gollan, Timothy J, MD  Active Pharmacy Records, Self  metoprolol  succinate (TOPROL -XL) 25 MG 24 hr tablet [id number] Yes Take 1 tablet (25 mg total) by mouth daily. Gollan, Timothy J, MD  Active Pharmacy Records, Self  mirtazapine  (REMERON ) 15 MG tablet [id number] Yes TAKE 1 TABLET BY MOUTH NIGHTLY Tullo, Teresa L, MD  Active Pharmacy Records, Self  nitroGLYCERIN  (NITROSTAT ) 0.4 MG SL tablet [id number] Yes PLACE 1 TABLET UNDER TONGUE EVERY 5 MINUTES AS NEEDED FOR CHEST PAIN. IF NO RELIEF IN 15 MINUTES CALL 911 ( MAXIMUM 3 TABLETS). Gollan, Timothy J, MD  Active Pharmacy Records, Self  pantoprazole  (PROTONIX ) 40 MG tablet [id number] Yes Take 1 tablet (40 mg total) by mouth daily. Marylynn Verneita CROME, MD  Active Self, Pharmacy Records  ranolazine  (RANEXA ) 500 MG 12 hr  tablet [id number] Yes TAKE 1 TABLET BY MOUTH TWICE DAILY Gollan, Timothy J, MD  Active Pharmacy Records, Self  vitamin B-12 (CYANOCOBALAMIN ) 500 MCG tablet [id number] Yes Take 500 mcg by mouth daily. [provider]  Active Self, Pharmacy Records            Goals Addressed             This Visit's Progress    VBCI Transitions of Care (TOC) Care Plan       Problems:  Recent Hospitalization for treatment of community acquired pneumonia/ COVID 19. No Hospital Follow Up Provider appointment attempting to assist patient  Daughter scheduled telemedicine hospital follow up for 09/04/24.  Hospital follow up completed with primary care provider.   Goal:  Over the next 30 days, the patient will not experience hospital readmission  Interventions:  Pneumonia/ COVID Interventions: Provided education to patient to enhance basic understanding of COVID-19 as a viral disease, measures to prevent exposure, signs and symptoms, recommended vaccine schedule, when to contact provider Counseled on importance of regular laboratory monitoring  as prescribed Advised patient to discuss new or ongoing symptoms with provider Encouraged ongoing fluid intake Advised to take deep breaths and do coughing exercises every 2-4 hours - continue to use incentive spirometer Assessed for any ongoing/ new symptoms Medications reviewed and compliance discussed and advised.  Discussed medication changes. Assessed blood pressure reading Assessed patients appetite Referral to social worker for community resource needs and to discuss resources allowed with long term care policy Advised daughter to call patients long term care policy provider to discuss benefits.  Advised to seek emergency medical services for serious symptoms such as increase SOB and/ or chest pain.   Patient Self Care Activities:  Attend all scheduled provider appointments Call pharmacy for medication refills 3-7 days in advance of running out of  medications Call provider office for new concerns or questions  Notify RN Care Manager of TOC call rescheduling needs Participate in Transition of Care Program/Attend TOC scheduled calls Take medications as prescribed   Stay hydrated Continue to do deep breathing/ use incentive spirometer daily   Plan:  Telephone follow up appointment with care management team member scheduled for:  09/24/24 at 2 pm The patient has been provided with contact information for the care management team and has been advised to call with any health related questions or concerns.          Recommendation:   Continue Current Plan of Care  Follow Up Plan:   Telephone follow-up in 1 week  Arvin Seip RN, BSN, CCM [facility], Population Health Case Manager Phone: [phone]     "

## 2024-09-10 NOTE — Telephone Encounter (Signed)
 I have placed in results folder for review.

## 2024-09-13 ENCOUNTER — Other Ambulatory Visit

## 2024-09-13 DIAGNOSIS — E875 Hyperkalemia: Secondary | ICD-10-CM

## 2024-09-13 LAB — BASIC METABOLIC PANEL WITH GFR
BUN: 30 mg/dL — ABNORMAL HIGH (ref 6–23)
CO2: 21 meq/L (ref 19–32)
Calcium: 7.6 mg/dL — ABNORMAL LOW (ref 8.4–10.5)
Chloride: 109 meq/L (ref 96–112)
Creatinine, Ser: 1.57 mg/dL — ABNORMAL HIGH (ref 0.40–1.20)
GFR: 27.76 mL/min — ABNORMAL LOW
Glucose, Bld: 132 mg/dL — ABNORMAL HIGH (ref 70–99)
Potassium: 5.3 meq/L — ABNORMAL HIGH (ref 3.5–5.1)
Sodium: 139 meq/L (ref 135–145)

## 2024-09-13 NOTE — Telephone Encounter (Signed)
Scanned to media for review.

## 2024-09-15 ENCOUNTER — Other Ambulatory Visit: Payer: Self-pay | Admitting: Internal Medicine

## 2024-09-15 ENCOUNTER — Encounter: Payer: Self-pay | Admitting: Internal Medicine

## 2024-09-15 DIAGNOSIS — E875 Hyperkalemia: Secondary | ICD-10-CM | POA: Insufficient documentation

## 2024-09-15 DIAGNOSIS — N1832 Chronic kidney disease, stage 3b: Secondary | ICD-10-CM

## 2024-09-15 MED ORDER — FUROSEMIDE 20 MG PO TABS: 20.0000 mg | ORAL_TABLET | ORAL | 0 refills | Status: AC

## 2024-09-16 NOTE — Telephone Encounter (Signed)
 Clarification on furosemide  is needed. This telephone encounter states for pt to take for an overnight weight gain of 2 lbs or a weekly weight gain of 5 lbs. The result note message states to take every other day. Is pt supposed to be taking every other day or only if there is a weight gain nightly or weekly?

## 2024-09-16 NOTE — Telephone Encounter (Signed)
 Pt's daughter is aware and gave a verbal understanding.

## 2024-09-18 ENCOUNTER — Other Ambulatory Visit: Payer: Self-pay | Admitting: Licensed Clinical Social Worker

## 2024-09-20 NOTE — Patient Outreach (Signed)
 Social Drivers of Health  Community Resource and Care Coordination Visit Note   09/18/2024  Name: [name] MRN: [id number] DOB:[birth year]  Situation: Referral received for [facility] needs assessment and assistance related to Housing  LTC. I obtained verbal consent from Caregiver Patient.  Visit completed with patients daughter on the phone.   Background:   SDOH Interventions Today    Flowsheet Row Most Recent Value  SDOH Interventions   Food Insecurity Interventions Intervention Not Indicated  Housing Interventions Intervention Not Indicated  Transportation Interventions Intervention Not Indicated  Utilities Interventions Intervention Not Indicated     Assessment:   Goals Addressed             This Visit's Progress    BSW VBCI Social Work Care Plan       Current SDOH Barriers:  Wanting information regarding the process of the LTC living and the steps  Interventions: Patient interviewed and appropriate screenings performed Referred patient to community resources  Patients daughter has been talking to the insurance and the LTC policy to further understand how it will work if decided to to place patient in a LTC.          Recommendation:   attend all scheduled provider appointments follow up with LTC policy on the requirements for LTC patients daughter has been talking to them about regarding housing needs. Patient wants the SW to call back in 30 to follow up,because they are still trying to decide if this is the route that they want to take.  Follow Up Plan:   Telephone follow up appointment date/time:  10/18/2024 at 2:00 pm  Katrina CHARM Maranda HEDWIG, PhD [facility], [facility] Social Worker Direct Dial: [phone]  Fax: [phone]

## 2024-09-20 NOTE — Patient Instructions (Signed)
 Visit Information  Thank you for taking time to visit with me today. Please don't hesitate to contact me if I can be of assistance to you before our next scheduled appointment.  Our next appointment is by telephone on 10/18/2024 at 2:00pm Please call the care guide team at [phone] if you need to cancel or reschedule your appointment.   Following is a copy of your care plan:   Goals Addressed             This Visit's Progress    BSW VBCI Social Work Care Plan       Current SDOH Barriers:  Wanting information regarding the process of the LTC living and the steps  Interventions: Patient interviewed and appropriate screenings performed Referred patient to community resources  Patients daughter has been talking to the insurance and the LTC policy to further understand how it will work if decided to to place patient in a LTC.          Please call the Suicide and Crisis Lifeline: 988 call 1-800-273-TALK (toll free, 24 hour hotline) call 911 if you are experiencing a Mental Health or Behavioral Health Crisis or need someone to talk to.  Health Care Power of Attorney daughter verbalized understanding of Care plan and visit instructions communicated this visit  Katrina CHARM Maranda HEDWIG, PhD [facility], [facility] Social Worker Direct Dial: [phone]  Fax: [phone]

## 2024-09-24 ENCOUNTER — Telehealth: Payer: Self-pay

## 2024-09-24 NOTE — Addendum Note (Signed)
 Addended by: BRIEN SHARENE RAMAN on: 09/24/2024 07:35 PM   Modules accepted: Orders

## 2024-09-24 NOTE — Patient Instructions (Signed)
 Visit Information  Thank you for taking time to visit with me today. Please don't hesitate to contact me if I can be of assistance to you before our next scheduled telephone appointment.  Our next appointment is by telephone on 10/04/24 at 2 pm  Following is a copy of your care plan:   Goals Addressed             This Visit's Progress    VBCI Transitions of Care (TOC) Care Plan       Problems:  Recent Hospitalization for treatment of community acquired pneumonia/ COVID 19.     Goal:  Over the next 30 days, the patient will not experience hospital readmission  Interventions:  Pneumonia/ COVID Interventions: Provided education to patient to enhance basic understanding of COVID-19 as a viral disease, measures to prevent exposure, signs and symptoms, recommended vaccine schedule, when to contact provider Advised patient to discuss new or ongoing symptoms with provider Encouraged ongoing fluid intake Advised to take deep breaths and do coughing exercises every 2-4 hours - continue to use incentive spirometer Assessed for any ongoing/ new symptoms Medications reviewed and compliance discussed and advised.   Assessed patients appetite Confirmed outreach from social worker Advised to seek emergency medical services for serious symptoms such as increase SOB and/ or chest pain.  Assessed oxygen saturation and blood pressure readings  Patient Self Care Activities:  Attend all scheduled provider appointments Call pharmacy for medication refills 3-7 days in advance of running out of medications Call provider office for new concerns or questions  Notify RN Care Manager of TOC call rescheduling needs Participate in Transition of Care Program/Attend TOC scheduled calls Take medications as prescribed   Stay hydrated Continue to do deep breathing/ use incentive spirometer daily Continue to monitor blood pressure and oxygen saturation   Plan:  Telephone follow up appointment with care  management team member scheduled for:  10/04/24 at 2 pm        Patient verbalizes understanding of instructions and care plan provided today and agrees to view in MyChart. Active MyChart status and patient understanding of how to access instructions and care plan via MyChart confirmed with patient.     The patient has been provided with contact information for the care management team and has been advised to call with any health related questions or concerns.   Please call the care guide team at [phone] if you need to cancel or reschedule your appointment.   Please call the Suicide and Crisis Lifeline: 988 call the [place]  National Suicide Prevention Lifeline: (207) 542-8292 or TTY: 479-444-6126 TTY 703-537-0384) to talk to a trained counselor call 1-800-273-TALK (toll free, 24 hour hotline) if you are experiencing a Mental Health or Behavioral Health Crisis or need someone to talk to.  Arvin Seip RN, BSN, CCM [employer], Population Health Case Manager Phone: [phone]

## 2024-09-24 NOTE — Transitions of Care (Post Inpatient/ED Visit) (Signed)
 " Transition of Care week 3  Visit Note  09/24/2024  Name: [name] MRN: [id number]          DOB: 26-Mar-1926  Situation: Patient enrolled in [facility] 30-day program. Visit completed with patients daughter [name] by telephone.   Background:   Initial Transition Care Management Follow-up Telephone Call Discharge Date and Diagnosis: 09/02/24, community acquired pneumonia   Past Medical History:  Diagnosis Date   C. difficile colitis    CAD (coronary artery disease)    a. s/p CABG 1999; b. Nuclear 10/11, no scar or ischemia, EF 68%; b. Lexiscan  Myoview  06/18/14: no evidence of infarction or ischemia, EF 77%, low risk study; c. 02/2019 MV: EF 56%, ? mild lat ischemia->low risk.   Carotid artery disease    a. Doppler January, 2012, 40-59% bilateral; b. 02/2017 U/S: 1-39% bilat ICA stenosis.   Closed fracture pubis (HCC) 07/04/2017   COPD (chronic obstructive pulmonary disease) (HCC)    Diffuse cystic mastopathy    Diverticulitis    last colonoscopy  incomplete Jan 2011   Dizziness    stable now (Oct 2011) patient tells me question of TIA, we will obtain records   Gait abnormality    Patient complains of wobbly gait, December, 2013   GERD (gastroesophageal reflux disease)    History of migraines    Hx of CABG    a. 3 vessel in 1999   Hyperlipidemia    Hypertension    Indigestion    3 weeks, October 2011   Kidney stones    Rheumatic fever    SCC (squamous cell carcinoma), face 08/01/2016   Sinus bradycardia    Mild, December, 2013   SOB (shortness of breath)    Syncopal episodes    after 18 holes of golf and increase hydrochlorothiazide , resolved     Assessment: Patient Reported Symptoms: Cognitive Cognitive Status: No symptoms reported, Alert and oriented to person, place, and time, Insightful and able to interpret abstract concepts, Normal speech and language skills      Neurological Neurological Review of Symptoms: No symptoms reported    HEENT HEENT  Symptoms Reported: No symptoms reported      Cardiovascular Cardiovascular Symptoms Reported: Other: Cardiovascular Comment: Daughter states patients blood pressure continues to do well. She states it ranges 130-140's/70-80's.  Daughter states patient is scheduled to have repeat K+ level drawn on 09/27/24.  Respiratory Respiratory Symptoms Reported: No symptoms reported    Endocrine Endocrine Symptoms Reported: No symptoms reported    Gastrointestinal Gastrointestinal Symptoms Reported: No symptoms reported      Genitourinary Genitourinary Symptoms Reported: No symptoms reported    Integumentary Integumentary Symptoms Reported: No symptoms reported    Musculoskeletal Musculoskelatal Symptoms Reviewed: Weakness Additional Musculoskeletal Details: ambulates with walker per daughter Musculoskeletal Management Strategies: Medical device      Psychosocial Psychosocial Symptoms Reported: No symptoms reported         Today's Vitals   09/24/24 1443  BP: (!) 140/70  SpO2: 96%   Pain Score: 0-No pain  Medications Reviewed Today     Reviewed by Robertson Colclough E, RN (Registered Nurse) on 09/24/24 at 1431  Med List Status: <None>   Medication Order Taking? Sig Documenting Provider Last Dose Status Informant  atorvastatin  (LIPITOR) 40 MG tablet [id number] Yes Take 1 tablet (40 mg total) by mouth daily. Marylynn Verneita CROME, MD  Active Pharmacy Records, Self  clopidogrel  (PLAVIX ) 75 MG tablet [id number] Yes TAKE 1 TABLET BY MOUTH DAILY Gollan, Timothy J, MD  Active Pharmacy Records, Self  fluticasone  (FLONASE ) 50 MCG/ACT nasal spray [id number] Yes Place 2 sprays into both nostrils daily. Marylynn Verneita CROME, MD  Active Self, Pharmacy Records           Med Note (LAWS, REGINA C   Wed Feb 21, 2024 11:37 AM) As needed  fluticasone -salmeterol (ADVAIR ) 250-50 MCG/ACT AEPB [id number] Yes Inhale 1 puff into the lungs 2 (two) times daily. Laurita Pillion, MD  Active   furosemide  (LASIX ) 20 MG tablet [id number] Yes  Take 1 tablet (20 mg total) by mouth every other day. Marylynn Verneita CROME, MD  Active   ipratropium-albuterol  (DUONEB) 0.5-2.5 (3) MG/3ML SOLN [id number] Yes Take 3 mLs by nebulization every 6 (six) hours as needed. Tullo, Teresa L, MD  Active   isosorbide  mononitrate (IMDUR ) 60 MG 24 hr tablet [id number]  TAKE ONE AND ONE-HALF TABLET BY MOUTH TWICE DAILY  Patient not taking: Reported on 09/24/2024   Gollan, Timothy J, MD  Active Pharmacy Records, Self  metoprolol  succinate (TOPROL -XL) 25 MG 24 hr tablet [id number] Yes Take 1 tablet (25 mg total) by mouth daily. Gollan, Timothy J, MD  Active Pharmacy Records, Self  mirtazapine  (REMERON ) 15 MG tablet [id number] Yes TAKE 1 TABLET BY MOUTH NIGHTLY Tullo, Teresa L, MD  Active Pharmacy Records, Self  nitroGLYCERIN  (NITROSTAT ) 0.4 MG SL tablet [id number] Yes PLACE 1 TABLET UNDER TONGUE EVERY 5 MINUTES AS NEEDED FOR CHEST PAIN. IF NO RELIEF IN 15 MINUTES CALL 911 ( MAXIMUM 3 TABLETS). Gollan, Timothy J, MD  Active Pharmacy Records, Self  pantoprazole  (PROTONIX ) 40 MG tablet [id number] Yes Take 1 tablet (40 mg total) by mouth daily. Marylynn Verneita CROME, MD  Active Self, Pharmacy Records  ranolazine  (RANEXA ) 500 MG 12 hr tablet [id number] Yes TAKE 1 TABLET BY MOUTH TWICE DAILY Gollan, Timothy J, MD  Active Pharmacy Records, Self  vitamin B-12 (CYANOCOBALAMIN ) 500 MCG tablet [id number] Yes Take 500 mcg by mouth daily. [provider]  Active Self, Pharmacy Records            Goals Addressed             This Visit's Progress    VBCI Transitions of Care (TOC) Care Plan       Problems:  Recent Hospitalization for treatment of community acquired pneumonia/ COVID 19.     Goal:  Over the next 30 days, the patient will not experience hospital readmission  Interventions:  Pneumonia/ COVID Interventions: Provided education to patient to enhance basic understanding of COVID-19 as a viral disease, measures to prevent exposure, signs and symptoms, recommended  vaccine schedule, when to contact provider Advised patient to discuss new or ongoing symptoms with provider Encouraged ongoing fluid intake Advised to take deep breaths and do coughing exercises every 2-4 hours - continue to use incentive spirometer Assessed for any ongoing/ new symptoms Medications reviewed and compliance discussed and advised.   Assessed patients appetite Confirmed outreach from social worker Advised to seek emergency medical services for serious symptoms such as increase SOB and/ or chest pain.  Assessed oxygen saturation and blood pressure readings  Patient Self Care Activities:  Attend all scheduled provider appointments Call pharmacy for medication refills 3-7 days in advance of running out of medications Call provider office for new concerns or questions  Notify RN Care Manager of TOC call rescheduling needs Participate in Transition of Care Program/Attend TOC scheduled calls Take medications as prescribed   Stay hydrated Continue to do deep breathing/ use incentive spirometer daily Continue to  monitor blood pressure and oxygen saturation   Plan:  Telephone follow up appointment with care management team member scheduled for:  10/04/24 at 2 pm        Recommendation:   Continue Current Plan of Care  Follow Up Plan:   Telephone follow-up in 1 week  Arvin Seip RN, BSN, CCM [facility], Population Health Case Manager Phone: [phone]     "

## 2024-09-27 ENCOUNTER — Telehealth: Payer: Self-pay | Admitting: Internal Medicine

## 2024-09-27 ENCOUNTER — Telehealth: Payer: Self-pay

## 2024-09-27 ENCOUNTER — Other Ambulatory Visit

## 2024-09-27 DIAGNOSIS — E875 Hyperkalemia: Secondary | ICD-10-CM

## 2024-09-27 NOTE — Telephone Encounter (Signed)
 Copied from CRM [id number]. Topic: Clinical - Medical Advice >> Sep 27, 2024  9:37 AM Thersia BROCKS wrote: Reason for CRM: [name] family friend patient has been having issues with eyes, stated eye dr says maybe due to low blood pressure and dehydration didn't know if needs to removed 2 of her blood pressure medications . Dr states may need to continue , states patient is very tired, doesnt have energy . Not sure if its because of blood pressure being low . Patient is coming in for labs today wanted to know if she needs to bring in readings today when she comes    [id number]

## 2024-09-27 NOTE — Telephone Encounter (Signed)
 Pt dropped off a list of BP readings for Dr Marylynn to review. Also, concern was brought up about low BP readings and how to move forward in regard to Lasix . Please reach out to pt's daughter Darice at [phone]. List of BP readings is in the color folder up front.

## 2024-09-27 NOTE — Telephone Encounter (Signed)
 Copied to previous message.

## 2024-09-27 NOTE — Telephone Encounter (Signed)
 [place], Kara E routed this conversation to [employer] Loyce Saddie BRAVO [facility]   09/27/24  2:09 PM Note Pt dropped off a list of BP readings for Dr Marylynn to review. Also, concern was brought up about low BP readings and how to move forward in regard to Lasix . Please reach out to pt's daughter Darice at [phone]. List of BP readings is in the color folder up front.

## 2024-09-27 NOTE — Telephone Encounter (Signed)
 BP readings have been placed in yellow results folder for review.

## 2024-09-28 LAB — BASIC METABOLIC PANEL WITH GFR
BUN/Creatinine Ratio: 18 (ref 12–28)
BUN: 29 mg/dL (ref 10–36)
CO2: 25 mmol/L (ref 20–29)
Calcium: 8.4 mg/dL — ABNORMAL LOW (ref 8.7–10.3)
Chloride: 99 mmol/L (ref 96–106)
Creatinine, Ser: 1.59 mg/dL — ABNORMAL HIGH (ref 0.57–1.00)
Glucose: 121 mg/dL — ABNORMAL HIGH (ref 70–99)
Potassium: 5.1 mmol/L (ref 3.5–5.2)
Sodium: 140 mmol/L (ref 134–144)
eGFR: 30 mL/min/1.73 — ABNORMAL LOW

## 2024-09-29 ENCOUNTER — Ambulatory Visit: Payer: Self-pay | Admitting: Internal Medicine

## 2024-09-29 ENCOUNTER — Encounter: Payer: Self-pay | Admitting: Cardiovascular Disease

## 2024-09-30 ENCOUNTER — Other Ambulatory Visit

## 2024-10-01 ENCOUNTER — Other Ambulatory Visit: Payer: Self-pay | Admitting: Internal Medicine

## 2024-10-01 NOTE — Telephone Encounter (Signed)
 Spoke with Dr. Tullo for clarification on how pt is supposed to take her lasix . Dr. Marylynn gave a verbal order that pt should take the lasix  three times a week on Monday Thursday and Saturday. I called pt's daughter Darice back and relayed the instructions to her. Darice gave a verbal understanding.

## 2024-10-02 NOTE — Progress Notes (Unsigned)
 "  Cardiology Clinic Note   Date: 10/04/2024 ID: [name], DOB 1925-11-02, MRN [id number]  Primary Cardiologist:  Evalene Lunger, MD  Chief Complaint   [name] is a 89 y.o. female who presents to the clinic today for evaluation of lower extremity edema.   Patient Profile   [name] is followed by Dr. Gollan for the history outlined below.      Past medical history significant for: CAD. CABG 1999. Nuclear stress test 03/07/2019: No significant ischemia.  Unable to exclude mild ischemia lateral wall on nonattenuation corrected images.  EKG with equivocal ST abnormality V2 through V5 following infusion of Lexiscan .  Patient did report chest pain.  Rare PVCs noted.  Moderate risk secondary to equivocal EKG changes and report of chest pain. Chronic HFpEF. Echo 01/29/2024: EF 50 to 55%.  No RWMA.  Grade 2 DD.  Normal RV size/function.  Severe LAE.  Mild to moderate RAE.  Mild MR.  Mild to moderate TR. Bicuspid aortic valve.  Mild AI. PAF. Carotid artery disease. Carotid duplex 03/15/2017: Stable 1 to 39% bilateral ICA.  Normal subclavian arteries bilaterally.  Patent vertebral arteries. Hypertension. Hyperlipidemia. COPD. CKD stage IV. Macular degeneration. Vascular dementia.  In summary, patient has history of CABG performed in 1999.  She transferred her care from Dr. Micky in [place] to Dr. Lunger in [place] in June 2014.  Echo in September 2016 showed EF 60 to 65% with mild MR/AI.  She suffered a fall in June 2018 sustaining a pelvic fracture.  Carotid ultrasound in June 2018 showed stable mild carotid artery stenosis.  In June 2020 she had an intermediate risk stress test.  Isosorbide  was increased with some improvement to chest pain.  She was having occasional chest pain at 5 AM felt to be secondary to anxiety over living by herself and being less active in the setting of COVID.  Isosorbide  was further titrated with plan to add Ranexa  if she continues to be  symptomatic.  Intermittent angina has been managed with isosorbide , Ranexa , as needed SL NTG.  She and her family had previously declined cardiac catheterization.  Echo May 2025 demonstrated normal LV/RV function.  Patient was last seen in the office by Dr. Gollan on 04/01/2024 for routine follow-up.  She was stable from a cardiac standpoint.  BP was well-controlled.  No medication changes were made.  Patient underwent hospital admission 08/26/2024 to 09/02/2024 for community-acquired pneumonia and COVID.  She was treated with steroids and antibiotics.  proBNP was elevated to 17,000 and she was diuresed with IV Lasix  with improvement in breathing and lower extremity edema.  Patient started contacted the office on 09/29/2024 via MyChart to report losartan  and isosorbide  were discontinued secondary to low BP.  BP was labile getting as low as 93/54 and as high as 155/76.     History of Present Illness    Today, patient is accompanied by her home health RN caregiver. Family wanted cardiology to evaluate patient after PCP made medication changes. Losartan  and isosorbide  were stopped secondary to hypotension. Home BP readings: 1/11 99/69 at 123/57 weight 115.4 1/12 86/61 weight 115.6 1/14 123/63 weight 117 1/15 128/78 weight 116.8  Patient's caregiver reports Lasix  was held when BP was hypotension. She was able to take Lasix  yesterday and will  have it again tomorrow following PCP's instructions of Lasix  dosing TuThSa. They have noted increased left lower extremity edema with weeping. Patient states left leg is usually more edematous than right, as that is  the leg they took the vein from for CABG. She denies increased dyspnea, orthopnea or PND. She denies chest pain since stopping isosorbide . She has had around the clock home care since getting out of the hospital in December.     ROS: All other systems reviewed and are otherwise negative except as noted in History of Present Illness.  EKGs/Labs  Reviewed    EKG Interpretation Date/Time:  Friday October 04 2024 11:43:24 EST Ventricular Rate:  71 PR Interval:  132 QRS Duration:  84 QT Interval:  416 QTC Calculation: 452 R Axis:   88  Text Interpretation: Sinus rhythm with occasional Premature ventricular complexes Nonspecific ST and T wave abnormality When compared with ECG of 25-Aug-2024 12:18, Criteria for Septal infarct are no longer Present Inverted T waves have replaced nonspecific T wave abnormality in Anterior leads Confirmed by Loistine Sober [phone]) on 10/04/2024 11:48:39 AM   09/09/2024: ALT 29; AST 18 09/27/2024: BUN 29; Creatinine, Ser 1.59; Potassium 5.1; Sodium 140   08/28/2024: Hemoglobin 10.8; WBC 13.9   12/21/2023: TSH 3.75   07/15/2024: BNP 454.5 08/28/2024: Pro Brain Natriuretic Peptide 17,651.0    Physical Exam    VS:  BP 128/68 (BP Location: Left Arm, Patient Position: Sitting, Cuff Size: Normal)   Pulse 71   Ht 5' 3 (1.6 m)   Wt 117 lb (53.1 kg)   SpO2 97%   BMI 20.73 kg/m  , BMI Body mass index is 20.73 kg/m.  GEN: Well nourished, well developed, in no acute distress. Neck: No JVD or carotid bruits. Cardiac:  RRR.  No murmur. No rubs or gallops.   Respiratory:  Respirations regular and unlabored. Clear to auscultation without rales, wheezing or rhonchi. GI: Soft, nontender, nondistended. Extremities: Radials/DP/PT 2+ and equal bilaterally. No clubbing or cyanosis. 1+ pitting edema L>R lower extremities. Small open area on back of left leg with evidence of blood and yellow drainage on bandaid. Compression stockings in place.   Skin: Warm and dry, no rash. Neuro: Strength intact.  Assessment & Plan   CAD S/p CABG 1999.  Nuclear stress test June 2020 was an intermediate risk study secondary to equivocal EKG changes and report of chest pain.  It showed no significant ischemia but was unable to exclude mild ischemia of the lateral wall on nonattenuation corrected images with ST abnormality B2  through V5 following infusion of Lexiscan .  Patient was treated medically.  Isosorbide  was recently discontinued secondary to low BP.  She denies chest pain since stopping isosorbide . EKG without acute changes.  - Continue atorvastatin , Plavix , Toprol , Ranexa , as needed SL NTG.  Chronic HFpEF Echo May 2025 demonstrated normal LV/RV function, Grade II DD, severe LAE, mild to moderate RAE, mild MR/AI, mild to moderate TR, bicuspid aortic valve.  Losartan  was recently discontinued secondary to low BP.  Patient reports increased lower extremity edema L>R since hospital discharge. She held a couple of doses of Lasix  secondary to low BP. Home caregiver noted weeping from back of left leg. She denies dyspnea with routine activities, orthopnea or PND. She wears compression stockings. Home health RN states she can wrap patient's leg today and check on it again on Monday. Patient has around the clock home care so if the wraps need to be removed earlier once of the caregivers can do so. Daily weight has been stable. On exam, patient has 1+ pitting edema L>R lower extremities. There is a small open area on the back of patient's left leg that is not actively weeping.  There is evidence of blood and yellow drainage on the bandaid.  Discussed not making changes to Lasix  until lab work from today is resulted. Depending on patient's kidney function she may be able to take an occasional extra dose of Lasix  as needed for weight gain. For now continue conservative management with Lasix  3 days a week, compression and elevation. Will test out unna boot wrap on the left lower extremity over the weekend.  - Continue Toprol , Lasix  TuThSa. - Try unna boot wrap to left lower extremity to be applied by home health RN today and removed Monday.  - BMP today.   Hypotension BP today 128/68.  Isosorbide  and losartan  were recently discontinued secondary to low BP. Home BP has slowly improved since medications have been held going from 86/61  to more consistently in the 120s systolic.  - Continue to monitor BP.  - Continue Toprol .  Disposition: BMP and b12 (per family request) today. Return in 3 months.          Signed, Barnie HERO. Jarrin Staley, DNP, NP-C  "

## 2024-10-04 ENCOUNTER — Encounter: Payer: Self-pay | Admitting: Student

## 2024-10-04 ENCOUNTER — Telehealth: Payer: Self-pay

## 2024-10-04 ENCOUNTER — Ambulatory Visit: Attending: Student | Admitting: Student

## 2024-10-04 VITALS — BP 128/68 | HR 71 | Ht 63.0 in | Wt 117.0 lb

## 2024-10-04 DIAGNOSIS — R600 Localized edema: Secondary | ICD-10-CM

## 2024-10-04 DIAGNOSIS — I959 Hypotension, unspecified: Secondary | ICD-10-CM | POA: Diagnosis not present

## 2024-10-04 DIAGNOSIS — Z79899 Other long term (current) drug therapy: Secondary | ICD-10-CM | POA: Diagnosis not present

## 2024-10-04 DIAGNOSIS — Z951 Presence of aortocoronary bypass graft: Secondary | ICD-10-CM | POA: Diagnosis not present

## 2024-10-04 DIAGNOSIS — I2510 Atherosclerotic heart disease of native coronary artery without angina pectoris: Secondary | ICD-10-CM

## 2024-10-04 DIAGNOSIS — I5032 Chronic diastolic (congestive) heart failure: Secondary | ICD-10-CM

## 2024-10-04 NOTE — Transitions of Care (Post Inpatient/ED Visit) (Signed)
 " Transition of Care week 4  Visit Note  10/04/2024  Name: [name] MRN: [id number]          DOB: October 16, 1925  Situation: Patient enrolled in [facility] 30-day program. Visit completed with daughter/ dpr [name] by telephone.   Background:   Initial Transition Care Management Follow-up Telephone Call Discharge Date and Diagnosis: 09/02/24, community acquired pneumonia   Past Medical History:  Diagnosis Date   C. difficile colitis    CAD (coronary artery disease)    a. s/p CABG 1999; b. Nuclear 10/11, no scar or ischemia, EF 68%; b. Lexiscan  Myoview  06/18/14: no evidence of infarction or ischemia, EF 77%, low risk study; c. 02/2019 MV: EF 56%, ? mild lat ischemia->low risk.   Carotid artery disease    a. Doppler January, 2012, 40-59% bilateral; b. 02/2017 U/S: 1-39% bilat ICA stenosis.   Closed fracture pubis (HCC) 07/04/2017   COPD (chronic obstructive pulmonary disease) (HCC)    Diffuse cystic mastopathy    Diverticulitis    last colonoscopy  incomplete Jan 2011   Dizziness    stable now (Oct 2011) patient tells me question of TIA, we will obtain records   Gait abnormality    Patient complains of wobbly gait, December, 2013   GERD (gastroesophageal reflux disease)    History of migraines    Hx of CABG    a. 3 vessel in 1999   Hyperlipidemia    Hypertension    Indigestion    3 weeks, October 2011   Kidney stones    Rheumatic fever    SCC (squamous cell carcinoma), face 08/01/2016   Sinus bradycardia    Mild, December, 2013   SOB (shortness of breath)    Syncopal episodes    after 18 holes of golf and increase hydrochlorothiazide , resolved     Assessment: Patient Reported Symptoms: Cognitive Cognitive Status: No symptoms reported, Alert and oriented to person, place, and time, Normal speech and language skills      Neurological Neurological Review of Symptoms: No symptoms reported    HEENT HEENT Symptoms Reported: No symptoms reported       Cardiovascular Cardiovascular Symptoms Reported: Other: Other Cardiovascular Symptoms: Daughter states patient's blood pressure was low resulting in cardiologist adjusting medications. Cardiovascular Management Strategies: Routine screening, Medication therapy, Diet modification  Respiratory Respiratory Symptoms Reported: No symptoms reported    Endocrine Endocrine Symptoms Reported: No symptoms reported    Gastrointestinal Gastrointestinal Symptoms Reported: No symptoms reported      Genitourinary Genitourinary Symptoms Reported: No symptoms reported    Integumentary Integumentary Symptoms Reported: No symptoms reported    Musculoskeletal Musculoskelatal Symptoms Reviewed: Weakness Additional Musculoskeletal Details: ambulates with walker per daughter Musculoskeletal Management Strategies: Medical device      Psychosocial Psychosocial Symptoms Reported: No symptoms reported         Today's Vitals   10/04/24 1434  BP: 134/60  SpO2: 98%   Pain Scale: 0-10 Pain Score: 0-No pain  Medications Reviewed Today     Reviewed by Trease Bremner E, RN (Registered Nurse) on 10/04/24 at 1427  Med List Status: <None>   Medication Order Taking? Sig Documenting Provider Last Dose Status Informant  atorvastatin  (LIPITOR) 40 MG tablet [id number] Yes Take 1 tablet (40 mg total) by mouth daily. Marylynn Verneita CROME, MD  Active Pharmacy Records, Self  clopidogrel  (PLAVIX ) 75 MG tablet [id number] Yes TAKE 1 TABLET BY MOUTH DAILY Gollan, Timothy J, MD  Active Pharmacy Records, Self  fluticasone  (FLONASE ) 50 MCG/ACT nasal  spray [id number] Yes Place 2 sprays into both nostrils daily. Marylynn Verneita CROME, MD  Active Self, Pharmacy Records           Med Note (LAWS, REGINA C   Wed Feb 21, 2024 11:37 AM) As needed  fluticasone -salmeterol (ADVAIR ) 250-50 MCG/ACT AEPB [id number] Yes Inhale 1 puff into the lungs 2 (two) times daily. Laurita Pillion, MD  Active   furosemide  (LASIX ) 20 MG tablet [id number] Yes Take 1 tablet  (20 mg total) by mouth every other day. Marylynn Verneita CROME, MD  Active   ipratropium-albuterol  (DUONEB) 0.5-2.5 (3) MG/3ML SOLN [id number] Yes Take 3 mLs by nebulization every 6 (six) hours as needed. Tullo, Teresa L, MD  Active   metoprolol  succinate (TOPROL -XL) 25 MG 24 hr tablet [id number] Yes Take 1 tablet (25 mg total) by mouth daily. Gollan, Timothy J, MD  Active Pharmacy Records, Self  mirtazapine  (REMERON ) 15 MG tablet [id number] Yes TAKE 1 TABLET BY MOUTH NIGHTLY Tullo, Teresa L, MD  Active Pharmacy Records, Self  nitroGLYCERIN  (NITROSTAT ) 0.4 MG SL tablet [id number] Yes PLACE 1 TABLET UNDER TONGUE EVERY 5 MINUTES AS NEEDED FOR CHEST PAIN. IF NO RELIEF IN 15 MINUTES CALL 911 ( MAXIMUM 3 TABLETS). Gollan, Timothy J, MD  Active Pharmacy Records, Self  pantoprazole  (PROTONIX ) 40 MG tablet [id number] Yes Take 1 tablet (40 mg total) by mouth daily. Marylynn Verneita CROME, MD  Active Self, Pharmacy Records  ranolazine  (RANEXA ) 500 MG 12 hr tablet [id number] Yes TAKE 1 TABLET BY MOUTH TWICE DAILY Gollan, Timothy J, MD  Active Pharmacy Records, Self  vitamin B-12 (CYANOCOBALAMIN ) 500 MCG tablet [id number] Yes Take 500 mcg by mouth daily. [provider]  Active Self, Pharmacy Records            Goals Addressed             This Visit's Progress    VBCI Transitions of Care (TOC) Care Plan       Problems:  Recent Hospitalization for treatment of community acquired pneumonia/ COVID 19.     Goal:  Over the next 30 days, the patient will not experience hospital readmission  Interventions:  Pneumonia/ COVID Interventions: Advised patient to discuss new or ongoing symptoms with provider Encouraged ongoing fluid intake Advised to take deep breaths and do coughing exercises every 2-4 hours - continue to use incentive spirometer Assessed for any ongoing/ new symptoms Medications reviewed and compliance discussed and advised.  Medication changes discussed.  Confirmed outreach from child psychotherapist-  daughter reminded of upcoming social worker visit on 10/18/24 Advised to seek emergency medical services for serious symptoms such as increase SOB and/ or chest pain.  Assessed oxygen saturation and blood pressure readings  Patient Self Care Activities:  Attend all scheduled provider appointments Call pharmacy for medication refills 3-7 days in advance of running out of medications Call provider office for new concerns or questions  Notify RN Care Manager of TOC call rescheduling needs Participate in Transition of Care Program/Attend TOC scheduled calls Take medications as prescribed   Stay hydrated Continue to do deep breathing/ use incentive spirometer daily Continue to monitor blood pressure and oxygen saturation   Plan:  Telephone follow up appointment with care management team member scheduled for:  10/08/24 at 3:30 pm         Recommendation:   Continue Current Plan of Care  Follow Up Plan:   Telephone follow-up in 1 week  Arvin Seip RN, BSN, CCM [place]  [facility], [facility]  Case Manager Phone: [phone]     "

## 2024-10-04 NOTE — Patient Instructions (Signed)
 Visit Information  Thank you for taking time to visit with me today. Please don't hesitate to contact me if I can be of assistance to you before our next scheduled telephone appointment.  Our next appointment is by telephone on 10/08/24 at 3:30pm  Following is a copy of your care plan:   Goals Addressed             This Visit's Progress    VBCI Transitions of Care (TOC) Care Plan       Problems:  Recent Hospitalization for treatment of community acquired pneumonia/ COVID 19.     Goal:  Over the next 30 days, the patient will not experience hospital readmission  Interventions:  Pneumonia/ COVID Interventions: Advised patient to discuss new or ongoing symptoms with provider Encouraged ongoing fluid intake Advised to take deep breaths and do coughing exercises every 2-4 hours - continue to use incentive spirometer Assessed for any ongoing/ new symptoms Medications reviewed and compliance discussed and advised.  Medication changes discussed.  Confirmed outreach from child psychotherapist- daughter reminded of upcoming social worker visit on 10/18/24 Advised to seek emergency medical services for serious symptoms such as increase SOB and/ or chest pain.  Assessed oxygen saturation and blood pressure readings  Patient Self Care Activities:  Attend all scheduled provider appointments Call pharmacy for medication refills 3-7 days in advance of running out of medications Call provider office for new concerns or questions  Notify RN Care Manager of TOC call rescheduling needs Participate in Transition of Care Program/Attend TOC scheduled calls Take medications as prescribed   Stay hydrated Continue to do deep breathing/ use incentive spirometer daily Continue to monitor blood pressure and oxygen saturation   Plan:  Telephone follow up appointment with care management team member scheduled for:  10/08/24 at 3:30 pm        Patient verbalizes understanding of instructions and care plan  provided today and agrees to view in MyChart. Active MyChart status and patient understanding of how to access instructions and care plan via MyChart confirmed with patient.     The patient has been provided with contact information for the care management team and has been advised to call with any health related questions or concerns.   Please call the care guide team at [phone] if you need to cancel or reschedule your appointment.   Please call the Suicide and Crisis Lifeline: 988 call the [place]  National Suicide Prevention Lifeline: 778-793-9410 or TTY: (236)684-3932 TTY 229-225-5973) to talk to a trained counselor call 1-800-273-TALK (toll free, 24 hour hotline) if you are experiencing a Mental Health or Behavioral Health Crisis or need someone to talk to.  Arvin Seip RN, BSN, CCM [employer], Population Health Case Manager Phone: [phone]

## 2024-10-04 NOTE — Patient Instructions (Signed)
 Medication Instructions:   Your physician recommends that you continue on your current medications as directed. Please refer to the Current Medication list given to you today.    It ok for you to try [facility] for the left leg.  *If you need a refill on your cardiac medications before your next appointment, please call your pharmacy*  Lab Work:  Your provider would like for you to have following labs drawn today B12 and BMet.    If you have labs (blood work) drawn today and your tests are completely normal, you will receive your results only by:  MyChart Message (if you have MyChart) OR  A paper copy in the mail If you have any lab test that is abnormal or we need to change your treatment, we will call you to review the results.  Testing/Procedures:  None ordered at this time   Referrals:  None ordered at this time   Follow-Up:  At [facility], you and your health needs are our priority.  As part of our continuing mission to provide you with exceptional heart care, our providers are all part of one team.  This team includes your primary Cardiologist (physician) and Advanced Practice Providers or APPs (Physician Assistants and Nurse Practitioners) who all work together to provide you with the care you need, when you need it.  Your next appointment:   3 month(s)  Provider:    You may see Timothy Gollan, MD or one of the following Advanced Practice Providers on your designated Care Team:   Lonni Meager, NP Lesley Maffucci, PA-C Bernardino Bring, PA-C Cadence [place], PA-C Tylene Lunch, NP Barnie Hila, NP    We recommend signing up for the patient portal called MyChart.  Sign up information is provided on this After Visit Summary.  MyChart is used to connect with patients for Virtual Visits (Telemedicine).  Patients are able to view lab/test results, encounter notes, upcoming appointments, etc.  Non-urgent messages can be sent to your provider as well.   To learn  more about what you can do with MyChart, go to [email or web address].

## 2024-10-05 LAB — BASIC METABOLIC PANEL WITH GFR
BUN/Creatinine Ratio: 17 (ref 12–28)
BUN: 25 mg/dL (ref 10–36)
CO2: 23 mmol/L (ref 20–29)
Calcium: 8.6 mg/dL — ABNORMAL LOW (ref 8.7–10.3)
Chloride: 102 mmol/L (ref 96–106)
Creatinine, Ser: 1.47 mg/dL — ABNORMAL HIGH (ref 0.57–1.00)
Glucose: 137 mg/dL — ABNORMAL HIGH (ref 70–99)
Potassium: 5.8 mmol/L (ref 3.5–5.2)
Sodium: 141 mmol/L (ref 134–144)
eGFR: 32 mL/min/1.73 — ABNORMAL LOW

## 2024-10-05 LAB — B12 AND FOLATE PANEL
Folate: >20 ng/mL
Vitamin B-12: 1439 pg/mL — ABNORMAL HIGH (ref 232–1245)

## 2024-10-07 ENCOUNTER — Other Ambulatory Visit
Admission: RE | Admit: 2024-10-07 | Discharge: 2024-10-07 | Disposition: A | Discharge status: Home / Self Care | Source: Ambulatory Visit | Attending: Student | Admitting: Student

## 2024-10-07 ENCOUNTER — Ambulatory Visit: Payer: Self-pay | Admitting: Student

## 2024-10-07 DIAGNOSIS — I4891 Unspecified atrial fibrillation: Secondary | ICD-10-CM

## 2024-10-07 DIAGNOSIS — E875 Hyperkalemia: Secondary | ICD-10-CM | POA: Diagnosis present

## 2024-10-07 LAB — BASIC METABOLIC PANEL WITH GFR
Anion gap: 12 (ref 5–15)
BUN: 28 mg/dL — ABNORMAL HIGH (ref 8–23)
CO2: 26 mmol/L (ref 22–32)
Calcium: 8.5 mg/dL — ABNORMAL LOW (ref 8.9–10.3)
Chloride: 100 mmol/L (ref 98–111)
Creatinine, Ser: 1.61 mg/dL — ABNORMAL HIGH (ref 0.44–1.00)
GFR, Estimated: 29 mL/min — ABNORMAL LOW
Glucose, Bld: 101 mg/dL — ABNORMAL HIGH (ref 70–99)
Potassium: 5.1 mmol/L (ref 3.5–5.1)
Sodium: 137 mmol/L (ref 135–145)

## 2024-10-07 NOTE — Addendum Note (Signed)
 Addended by: BRIEN SALM on: 10/07/2024 02:32 PM   Modules accepted: Orders

## 2024-10-07 NOTE — Telephone Encounter (Signed)
 Spoke with patient daughter regarding labs. Daughter will get her mom's caretaker [place] [facility] [place].

## 2024-10-08 ENCOUNTER — Ambulatory Visit: Payer: Self-pay | Admitting: Student

## 2024-10-08 NOTE — Telephone Encounter (Signed)
-----   Message from Barnie Hila, NP sent at 10/08/2024  8:15 AM EST ----- Please let patient know repeat potassium was on the high end of normal at 5.1. Limit foods high in potassium such as leafy greens, potatoes, sweet potatoes, citrus fruits/juices, dried fruits.    Thank you!  DW

## 2024-10-08 NOTE — Telephone Encounter (Signed)
 Called and left voicemail for Darice, patient's daughter, per DPR.  The message relayed the results of the most recent lab work as interpreted by Barnie Hila, NP as well as the recommendation to reduce high potassium foods.  Left call back number for call back in case of questions or concerns.

## 2024-10-09 ENCOUNTER — Telehealth: Payer: Self-pay

## 2024-10-09 NOTE — Patient Instructions (Signed)
 Visit Information  Thank you for taking time to visit with me today. You have completed the 30 TOC program and met your program goals. Please contact your primary care provider for any further needs/concerns.  Following is a copy of your care plan:   Goals Addressed             This Visit's Progress    VBCI Transitions of Care (TOC) Care Plan       Problems:  Recent Hospitalization for treatment of community acquired pneumonia/ COVID 19.     Goal:  Over the next 30 days, the patient will not experience hospital readmission  Interventions:  Pneumonia/ COVID Interventions: Advised patient to discuss new or ongoing symptoms with provider Assessed for any ongoing/ new symptoms Medications reviewed and compliance discussed and advised.   Discussed Potassium results and labs.  Discussed nutritional related foods that are high in potassium and precautionary measures with those foods. Confirmed outreach from child psychotherapist- daughter reminded of upcoming social worker visit on 10/18/24 Advised to seek emergency medical services for serious symptoms such as increase SOB and/ or chest pain.  Assessed oxygen saturation. Discussed and offered ongoing follow up with longitudinal RN case manager. Daughter declined services for patient.  Patient Self Care Activities:  Call pharmacy for medication refills 3-7 days in advance of running out of medications Call provider office for new concerns or questions  Take medications as prescribed   Stay hydrated Continue to monitor blood pressure and oxygen saturation   Plan:  No further follow up required: Patient completed 30 TOC program and met program goals        Patient verbalizes understanding of instructions and care plan provided today and agrees to view in MyChart. Active MyChart status and patient understanding of how to access instructions and care plan via MyChart confirmed with patient.     No further follow up required: 30 day TOC  goals met  Please call the care guide team at [phone] if you need to cancel or reschedule your appointment.   Please call the Suicide and Crisis Lifeline: 988 call the [place]  National Suicide Prevention Lifeline: (925) 034-7388 or TTY: 720-351-9451 TTY (780)493-1597) to talk to a trained counselor call 1-800-273-TALK (toll free, 24 hour hotline) if you are experiencing a Mental Health or Behavioral Health Crisis or need someone to talk to.  Arvin Seip RN, BSN, CCM [employer], Population Health Case Manager Phone: [phone]

## 2024-10-09 NOTE — Transitions of Care (Post Inpatient/ED Visit) (Signed)
 " Transition of Care week#5  Visit Note  10/09/2024  Name: [name] MRN: [id number]          DOB: 05-05-1926  Situation: Patient enrolled in [facility] 30-day program. Visit completed with daughter/dpr [name] by telephone.   Background:   Initial Transition Care Management Follow-up Telephone Call Discharge Date and Diagnosis: No data recorded   Past Medical History:  Diagnosis Date   C. difficile colitis    CAD (coronary artery disease)    a. s/p CABG 1999; b. Nuclear 10/11, no scar or ischemia, EF 68%; b. Lexiscan  Myoview  06/18/14: no evidence of infarction or ischemia, EF 77%, low risk study; c. 02/2019 MV: EF 56%, ? mild lat ischemia->low risk.   Carotid artery disease    a. Doppler January, 2012, 40-59% bilateral; b. 02/2017 [place]: 1-39% bilat ICA stenosis.   Closed fracture pubis (HCC) 07/04/2017   COPD (chronic obstructive pulmonary disease) (HCC)    Diffuse cystic mastopathy    Diverticulitis    last colonoscopy  incomplete Jan 2011   Dizziness    stable now (Oct 2011) patient tells me question of TIA, we will obtain records   Gait abnormality    Patient complains of wobbly gait, December, 2013   GERD (gastroesophageal reflux disease)    History of migraines    Hx of CABG    a. 3 vessel in 1999   Hyperlipidemia    Hypertension    Indigestion    3 weeks, October 2011   Kidney stones    Rheumatic fever    SCC (squamous cell carcinoma), face 08/01/2016   Sinus bradycardia    Mild, December, 2013   SOB (shortness of breath)    Syncopal episodes    after 18 holes of golf and increase hydrochlorothiazide , resolved     Assessment: Patient Reported Symptoms: Cognitive Cognitive Status: No symptoms reported, Alert and oriented to person, place, and time, Normal speech and language skills      Neurological Neurological Review of Symptoms: No symptoms reported    HEENT HEENT Symptoms Reported: No symptoms reported      Cardiovascular Cardiovascular  Symptoms Reported: No symptoms reported    Respiratory Respiratory Symptoms Reported: No symptoms reported Additional Respiratory Details: Daughter reports patient continues to do well with oxygenation in the high 90's Respiratory Management Strategies: Routine screening, Medication therapy, Adequate rest  Endocrine Endocrine Symptoms Reported: No symptoms reported    Gastrointestinal Gastrointestinal Symptoms Reported: No symptoms reported      Genitourinary Genitourinary Symptoms Reported: No symptoms reported    Integumentary Integumentary Symptoms Reported: No symptoms reported    Musculoskeletal Musculoskelatal Symptoms Reviewed: No symptoms reported Additional Musculoskeletal Details: Daughter states patient has improved with strenghtening and endurance with no falls Musculoskeletal Management Strategies: Routine screening, Medical device      Psychosocial Psychosocial Symptoms Reported: No symptoms reported         There were no vitals filed for this visit. Pain Scale: 0-10 Pain Score: 0-No pain  Medications Reviewed Today     Reviewed by Savon Cobbs E, RN (Registered Nurse) on 10/09/24 at 1259  Med List Status: <None>   Medication Order Taking? Sig Documenting Provider Last Dose Status Informant  atorvastatin  (LIPITOR) 40 MG tablet [id number] Yes Take 1 tablet (40 mg total) by mouth daily. Marylynn Verneita CROME, MD  Active Pharmacy Records, Self  clopidogrel  (PLAVIX ) 75 MG tablet [id number] Yes TAKE 1 TABLET BY MOUTH DAILY Gollan, Timothy J, MD  Active Pharmacy Records, Self  fluticasone  (FLONASE ) 50 MCG/ACT nasal spray [id number] Yes Place 2 sprays into both nostrils daily. Marylynn Verneita CROME, MD  Active Self, Pharmacy Records           Med Note (LAWS, ANGELINE BROCKS   Wed Feb 21, 2024 11:37 AM) As needed  fluticasone -salmeterol (ADVAIR ) 250-50 MCG/ACT AEPB [id number] Yes Inhale 1 puff into the lungs 2 (two) times daily. Laurita Pillion, MD  Active   furosemide  (LASIX ) 20 MG tablet  [id number] Yes Take 1 tablet (20 mg total) by mouth every other day. Marylynn Verneita CROME, MD  Active   ipratropium-albuterol  (DUONEB) 0.5-2.5 (3) MG/3ML SOLN [id number] Yes Take 3 mLs by nebulization every 6 (six) hours as needed. Tullo, Teresa L, MD  Active   metoprolol  succinate (TOPROL -XL) 25 MG 24 hr tablet [id number] Yes Take 1 tablet (25 mg total) by mouth daily. Gollan, Timothy J, MD  Active Pharmacy Records, Self  mirtazapine  (REMERON ) 15 MG tablet [id number] Yes TAKE 1 TABLET BY MOUTH NIGHTLY Tullo, Teresa L, MD  Active Pharmacy Records, Self  nitroGLYCERIN  (NITROSTAT ) 0.4 MG SL tablet [id number] Yes PLACE 1 TABLET UNDER TONGUE EVERY 5 MINUTES AS NEEDED FOR CHEST PAIN. IF NO RELIEF IN 15 MINUTES CALL 911 ( MAXIMUM 3 TABLETS). Gollan, Timothy J, MD  Active Pharmacy Records, Self  pantoprazole  (PROTONIX ) 40 MG tablet [id number] Yes Take 1 tablet (40 mg total) by mouth daily. Marylynn Verneita CROME, MD  Active Self, Pharmacy Records  ranolazine  (RANEXA ) 500 MG 12 hr tablet [id number] Yes TAKE 1 TABLET BY MOUTH TWICE DAILY Gollan, Timothy J, MD  Active Pharmacy Records, Self  vitamin B-12 (CYANOCOBALAMIN ) 500 MCG tablet [id number] Yes Take 500 mcg by mouth daily. [provider]  Active Self, Pharmacy Records            Goals Addressed             This Visit's Progress    VBCI Transitions of Care (TOC) Care Plan       Problems:  Recent Hospitalization for treatment of community acquired pneumonia/ COVID 19.     Goal:  Over the next 30 days, the patient will not experience hospital readmission  Interventions:  Pneumonia/ COVID Interventions: Advised patient to discuss new or ongoing symptoms with provider Assessed for any ongoing/ new symptoms Medications reviewed and compliance discussed and advised.   Discussed Potassium results and labs.  Discussed nutritional related foods that are high in potassium and precautionary measures with those foods. Confirmed outreach from child psychotherapist-  daughter reminded of upcoming social worker visit on 10/18/24 Advised to seek emergency medical services for serious symptoms such as increase SOB and/ or chest pain.  Assessed oxygen saturation. Discussed and offered ongoing follow up with longitudinal RN case manager. Daughter declined services for patient.  Patient Self Care Activities:  Call pharmacy for medication refills 3-7 days in advance of running out of medications Call provider office for new concerns or questions  Take medications as prescribed   Stay hydrated Continue to monitor blood pressure and oxygen saturation   Plan:  No further follow up required: Patient completed 30 TOC program and met program goals        Recommendation:   Continue Current Plan of Care  Follow Up Plan:   Closing From:  Transitions of Care Program. Patient completed 30 TOC program and met program goals  Arvin Seip RN, BSN, CCM [place]  [facility], Population Health Case Manager Phone: [phone]     "

## 2024-10-10 ENCOUNTER — Other Ambulatory Visit: Payer: Self-pay | Admitting: Cardiovascular Disease

## 2024-10-17 ENCOUNTER — Other Ambulatory Visit: Payer: Self-pay

## 2024-10-18 ENCOUNTER — Other Ambulatory Visit: Payer: Self-pay | Admitting: Licensed Clinical Social Worker

## 2024-10-18 NOTE — Patient Instructions (Signed)
 Visit Information  Thank you for taking time to visit with me today. Please don't hesitate to contact me if I can be of assistance to you before our next scheduled appointment.  Your next care management appointment is no further scheduled appointments.      Please call the care guide team at [phone] if you need to cancel, schedule, or reschedule an appointment.   Please call the Suicide and Crisis Lifeline: 988 call the [place]  National Suicide Prevention Lifeline: (229)584-5514 or TTY: 8183904920 TTY 920 266 6684) to talk to a trained counselor call 1-800-273-TALK (toll free, 24 hour hotline) call 911 if you are experiencing a Mental Health or Behavioral Health Crisis or need someone to talk to.  Katrina CHARM Maranda HEDWIG, PhD [facility], [facility] Social Worker Direct Dial: [phone]  Fax: [phone]

## 2024-10-18 NOTE — Patient Outreach (Signed)
 Social Drivers of Health  Community Resource and Care Coordination Visit Note   10/18/2024  Name: [name] MRN: [id number] DOB:02-04-1926  Situation: Referral received for [facility] needs assessment and assistance related to LTC resources. I obtained verbal consent from Caregiver Patient.  Visit completed with Daughter [name] on the phone.   Background:   SDOH Interventions Today    Flowsheet Row Most Recent Value  SDOH Interventions   Food Insecurity Interventions Intervention Not Indicated  Housing Interventions Intervention Not Indicated  Transportation Interventions Intervention Not Indicated  Utilities Interventions Intervention Not Indicated     Assessment:   Goals Addressed             This Visit's Progress    COMPLETED: BSW VBCI Social Work Care Plan       Current SDOH Barriers:  Wanting information regarding the process of the LTC living and the steps  Interventions: Patient interviewed and appropriate screenings performed Referred patient to community resources  Patients daughter has been talking to the insurance and the LTC policy to further understand how it will work if decided to to place patient in a LTC.          Recommendation:   attend all scheduled provider appointments Continue to talk to the PCP and to the LTC insurance policy with additional questions   Follow Up Plan:   Patient has achieved all patient stated goals. [employer] will be closed. Patient has been provided contact information should new needs arise.   Katrina CHARM Maranda HEDWIG, PhD [facility], [facility] Social Worker Direct Dial: [phone]  Fax: [phone]

## 2024-10-22 ENCOUNTER — Encounter: Payer: Self-pay | Admitting: Internal Medicine

## 2024-10-22 DIAGNOSIS — J449 Chronic obstructive pulmonary disease, unspecified: Secondary | ICD-10-CM

## 2025-01-03 ENCOUNTER — Ambulatory Visit: Admitting: Student

## 2025-02-24 ENCOUNTER — Ambulatory Visit
